# Patient Record
Sex: Male | Born: 1965 | Race: Black or African American | Hispanic: No | Marital: Single | Smoking: Never smoker
Health system: Southern US, Community
[De-identification: ages and names within clinical notes are randomized; demographics above are authoritative.]

## PROBLEM LIST (undated history)

## (undated) DIAGNOSIS — F1010 Alcohol abuse, uncomplicated: Secondary | ICD-10-CM

## (undated) DIAGNOSIS — B20 Human immunodeficiency virus [HIV] disease: Secondary | ICD-10-CM

## (undated) HISTORY — DX: Human immunodeficiency virus (HIV) disease: B20

## (undated) HISTORY — PX: NO PAST SURGERIES: SHX2092

---

## 1993-02-02 DIAGNOSIS — B20 Human immunodeficiency virus [HIV] disease: Secondary | ICD-10-CM

## 1993-02-02 HISTORY — DX: Human immunodeficiency virus (HIV) disease: B20

## 2001-09-30 ENCOUNTER — Emergency Department (HOSPITAL_COMMUNITY): Admission: EM | Admit: 2001-09-30 | Discharge: 2001-09-30 | Payer: Self-pay | Admitting: Emergency Medicine

## 2011-03-05 ENCOUNTER — Encounter (HOSPITAL_COMMUNITY): Payer: Self-pay | Admitting: Emergency Medicine

## 2011-03-05 ENCOUNTER — Emergency Department (INDEPENDENT_AMBULATORY_CARE_PROVIDER_SITE_OTHER)
Admission: EM | Admit: 2011-03-05 | Discharge: 2011-03-05 | Disposition: A | Discharge status: Home / Self Care | Payer: Self-pay | Source: Home / Self Care | Attending: Emergency Medicine | Admitting: Emergency Medicine

## 2011-03-05 ENCOUNTER — Emergency Department (INDEPENDENT_AMBULATORY_CARE_PROVIDER_SITE_OTHER): Payer: Self-pay

## 2011-03-05 DIAGNOSIS — S022XXA Fracture of nasal bones, initial encounter for closed fracture: Secondary | ICD-10-CM

## 2011-03-05 MED ORDER — IBUPROFEN 800 MG PO TABS: 800.0000 mg | ORAL_TABLET | Freq: Three times a day (TID) | ORAL | Status: AC

## 2011-03-05 MED ORDER — TETANUS-DIPHTH-ACELL PERTUSSIS 5-2.5-18.5 LF-MCG/0.5 IM SUSP
INTRAMUSCULAR | Status: AC
  Filled 2011-03-05: qty 0.5

## 2011-03-05 MED ORDER — BACITRACIN ZINC 500 UNIT/GM EX OINT: TOPICAL_OINTMENT | Freq: Two times a day (BID) | CUTANEOUS | Status: AC

## 2011-03-05 MED ORDER — TETANUS-DIPHTH-ACELL PERTUSSIS 5-2.5-18.5 LF-MCG/0.5 IM SUSP
0.5000 mL | Freq: Once | INTRAMUSCULAR | Status: AC
  Administered 2011-03-05: 0.5 mL via INTRAMUSCULAR

## 2011-03-05 NOTE — ED Notes (Signed)
Verified armband, changes made
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
## 2011-03-05 NOTE — ED Notes (Signed)
Unknown tetanus

## 2011-03-05 NOTE — ED Notes (Signed)
Not in treatment room 
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
## 2011-04-20 ENCOUNTER — Encounter (HOSPITAL_COMMUNITY): Payer: Self-pay | Admitting: *Deleted

## 2011-04-20 ENCOUNTER — Emergency Department (HOSPITAL_COMMUNITY)
Admission: EM | Admit: 2011-04-20 | Discharge: 2011-04-21 | Disposition: A | Discharge status: Home / Self Care | Payer: Self-pay | Attending: Emergency Medicine | Admitting: Emergency Medicine

## 2011-04-20 DIAGNOSIS — R6883 Chills (without fever): Secondary | ICD-10-CM | POA: Insufficient documentation

## 2011-04-20 DIAGNOSIS — R05 Cough: Secondary | ICD-10-CM | POA: Insufficient documentation

## 2011-04-20 DIAGNOSIS — R059 Cough, unspecified: Secondary | ICD-10-CM | POA: Insufficient documentation

## 2011-04-20 DIAGNOSIS — J189 Pneumonia, unspecified organism: Secondary | ICD-10-CM | POA: Insufficient documentation
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
## 2011-04-21 ENCOUNTER — Emergency Department (HOSPITAL_COMMUNITY): Payer: Self-pay

## 2011-04-21 MED ORDER — DEXTROSE 5 % IV SOLN
1.0000 g | Freq: Once | INTRAVENOUS | Status: AC
  Administered 2011-04-21: 1 g via INTRAVENOUS
  Filled 2011-04-21: qty 10

## 2011-04-21 MED ORDER — AZITHROMYCIN 250 MG PO TABS
500.0000 mg | ORAL_TABLET | Freq: Once | ORAL | Status: AC
  Administered 2011-04-21: 500 mg via ORAL
  Filled 2011-04-21: qty 2

## 2011-04-21 MED ORDER — AZITHROMYCIN 250 MG PO TABS: ORAL_TABLET | ORAL | Status: AC

## 2011-04-21 NOTE — ED Notes (Signed)
PT. RESTING

## 2011-04-21 NOTE — ED Provider Notes (Signed)
Medical screening examination/treatment/procedure(s) were performed by non-physician practitioner and as supervising physician I was immediately available for consultation/collaboration.   Dejour Vos D Idriss Quackenbush, MD 04/21/11 0742 
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
## 2011-04-21 NOTE — Discharge Instructions (Signed)
Pneumonia, Adult Pneumonia is an infection of the lungs. It may be caused by a germ (virus or bacteria). Some types of pneumonia can spread easily from person to person. This can happen when you cough or sneeze. HOME CARE  Only take medicine as told by your doctor.   Take your medicine (antibiotics) as told. Finish it even if you start to feel better.   Do not smoke.   You may use a vaporizer or humidifier in your room. This can help loosen thick spit (mucus).   Sleep so you are almost sitting up (semi-upright). This helps reduce coughing.   Rest.  A shot (vaccine) can help prevent pneumonia. Shots are often advised for:  People over 68 years old.   Patients on chemotherapy.   People with long-term (chronic) lung problems.   People with immune system problems.  GET HELP RIGHT AWAY IF:   You are getting worse.   You cannot control your cough, and you are losing sleep.   You cough up blood.   Your pain gets worse, even with medicine.   You have a fever.   Any of your problems are getting worse, not better.   You have shortness of breath or chest pain.  MAKE SURE YOU:   Understand these instructions.   Will watch your condition.   Will get help right away if you are not doing well or get worse.  Document Released: 07/08/2007 Document Revised: 01/08/2011 Document Reviewed: 04/11/2010 [facility] Patient Information 2012 [place], [place]. Please try to rest as much as she possibly can drink plenty of fluids take the antibiotic on a daily basis starting on March 20, you can give him today's dose in the emergency department

## 2011-04-21 NOTE — ED Notes (Signed)
Pt sleeping, meal offered, declined food, took meds w/o difficulty, IVF infusing, abx infusing.

## 2012-04-18 ENCOUNTER — Encounter (HOSPITAL_COMMUNITY): Payer: Self-pay | Admitting: *Deleted

## 2012-04-18 DIAGNOSIS — K4090 Unilateral inguinal hernia, without obstruction or gangrene, not specified as recurrent: Secondary | ICD-10-CM | POA: Insufficient documentation

## 2012-04-18 NOTE — ED Notes (Addendum)
Pt states that he has had a hernia (undiagnosed) pt states that he can hear bowel sounds through his hernia and that he has lost a large amount of weight as well. Pt states that the hernia has been there for approx 10 months pt also started drinking alcohol 12 months ago.

## 2012-04-19 ENCOUNTER — Emergency Department (HOSPITAL_COMMUNITY)
Admission: EM | Admit: 2012-04-19 | Discharge: 2012-04-19 | Disposition: A | Discharge status: Home / Self Care | Payer: Self-pay | Attending: Emergency Medicine | Admitting: Emergency Medicine

## 2012-04-19 DIAGNOSIS — K4091 Unilateral inguinal hernia, without obstruction or gangrene, recurrent: Secondary | ICD-10-CM

## 2012-04-19 NOTE — ED Provider Notes (Signed)
History     CSN: [id number]  Arrival date & time 04/18/12  2052   First MD Initiated Contact with Patient 04/19/12 0030      Chief Complaint  Patient presents with  . Hernia    (Consider location/radiation/quality/duration/timing/severity/associated sxs/prior treatment) HPI Comments: PAtient has had L inguinal hernia for the past year that has intermittently bothered him Darien Ramus it is painful.. Also reports weight loss and ETOH abuse over the past year  The history is provided by the patient.    History reviewed. No pertinent past medical history.  History reviewed. No pertinent past surgical history.  History reviewed. No pertinent family history.  History  Substance Use Topics  . Smoking status: Never Smoker   . Smokeless tobacco: Not on file  . Alcohol Use: Yes      Review of Systems  Constitutional: Negative for fever and chills.  Gastrointestinal: Positive for abdominal pain. Negative for nausea and vomiting.  Genitourinary: Negative for dysuria, frequency, scrotal swelling, penile pain and testicular pain.  Skin: Negative for rash and wound.  All other systems reviewed and are negative.    Allergies  Tomato  Home Medications  No current outpatient prescriptions on file.  BP 133/78  Pulse 74  Temp(Src) 98.7 F (37.1 C) (Oral)  Resp 16  SpO2 99%  Physical Exam  Constitutional: He appears well-developed and well-nourished.  HENT:  Head: Normocephalic and atraumatic.  Eyes: Pupils are equal, round, and reactive to light.  Neck: Normal range of motion.  Cardiovascular: Normal rate and regular rhythm.   Pulmonary/Chest: Effort normal and breath sounds normal.  Abdominal: Soft. Bowel sounds are normal. He exhibits no distension. There is tenderness. A hernia is present. Hernia confirmed positive in the left inguinal area.      ED Course  Procedures (including critical care time)  Labs Reviewed - No data to display No results found.   1.  Inguinal hernia recurrent unilateral       MDM   L inguinal hernia easily reduced         Arman Filter, NP 04/19/12 [phone]

## 2012-04-19 NOTE — ED Provider Notes (Signed)
Medical screening examination/treatment/procedure(s) were performed by non-physician practitioner and as supervising physician I was immediately available for consultation/collaboration.  Jasmine Awe, MD 04/19/12 [phone]

## 2012-04-27 ENCOUNTER — Emergency Department (HOSPITAL_COMMUNITY): Payer: Self-pay

## 2012-04-27 ENCOUNTER — Encounter (HOSPITAL_COMMUNITY): Payer: Self-pay | Admitting: Emergency Medicine

## 2012-04-27 ENCOUNTER — Emergency Department (HOSPITAL_COMMUNITY)
Admission: EM | Admit: 2012-04-27 | Discharge: 2012-04-27 | Disposition: A | Discharge status: Home / Self Care | Payer: Self-pay | Attending: Emergency Medicine | Admitting: Emergency Medicine

## 2012-04-27 DIAGNOSIS — K4090 Unilateral inguinal hernia, without obstruction or gangrene, not specified as recurrent: Secondary | ICD-10-CM | POA: Insufficient documentation

## 2012-04-27 LAB — COMPREHENSIVE METABOLIC PANEL
ALT: 49 U/L (ref 0–53)
AST: 94 U/L — ABNORMAL HIGH (ref 0–37)
Albumin: 3 g/dL — ABNORMAL LOW (ref 3.5–5.2)
Alkaline Phosphatase: 111 U/L (ref 39–117)
BUN: 9 mg/dL (ref 6–23)
CO2: 24 mEq/L (ref 19–32)
Calcium: 8.4 mg/dL (ref 8.4–10.5)
Chloride: 99 mEq/L (ref 96–112)
Creatinine, Ser: 0.79 mg/dL (ref 0.50–1.35)
GFR calc Af Amer: >90 mL/min (ref >90)
GFR calc non Af Amer: >90 mL/min (ref >90)
Glucose, Bld: 71 mg/dL (ref 70–99)
Potassium: 3.5 mEq/L (ref 3.5–5.1)
Sodium: 134 mEq/L — ABNORMAL LOW (ref 135–145)
Total Bilirubin: 0.7 mg/dL (ref 0.3–1.2)
Total Protein: 6.7 g/dL (ref 6.0–8.3)

## 2012-04-27 LAB — CBC
HCT: 33.5 % — ABNORMAL LOW (ref 39.0–52.0)
Hemoglobin: 11.6 g/dL — ABNORMAL LOW (ref 13.0–17.0)
MCH: 30.4 pg (ref 26.0–34.0)
MCHC: 34.6 g/dL (ref 30.0–36.0)
MCV: 87.7 fL (ref 78.0–100.0)
Platelets: 118 10*3/uL — ABNORMAL LOW (ref 150–400)
RBC: 3.82 MIL/uL — ABNORMAL LOW (ref 4.22–5.81)
RDW: 17.7 % — ABNORMAL HIGH (ref 11.5–15.5)
WBC: 2.7 10*3/uL — ABNORMAL LOW (ref 4.0–10.5)

## 2012-04-27 MED ORDER — IOHEXOL 300 MG/ML  SOLN
100.0000 mL | Freq: Once | INTRAMUSCULAR | Status: AC | PRN
  Administered 2012-04-27: 100 mL via INTRAVENOUS

## 2012-04-27 MED ORDER — TRAMADOL HCL 50 MG PO TABS: 50.0000 mg | ORAL_TABLET | Freq: Four times a day (QID) | ORAL | Status: DC | PRN

## 2012-04-27 MED ORDER — MORPHINE SULFATE 4 MG/ML IJ SOLN
4.0000 mg | Freq: Once | INTRAMUSCULAR | Status: AC
  Administered 2012-04-27: 4 mg via INTRAVENOUS
  Filled 2012-04-27: qty 1

## 2012-04-27 MED ORDER — ONDANSETRON HCL 4 MG/2ML IJ SOLN
4.0000 mg | Freq: Once | INTRAMUSCULAR | Status: AC
  Administered 2012-04-27: 4 mg via INTRAVENOUS
  Filled 2012-04-27: qty 2

## 2012-04-27 MED ORDER — SODIUM CHLORIDE 0.9 % IV BOLUS (SEPSIS)
1000.0000 mL | Freq: Once | INTRAVENOUS | Status: AC
  Administered 2012-04-27: 1000 mL via INTRAVENOUS

## 2012-04-27 MED ORDER — IOHEXOL 300 MG/ML  SOLN
20.0000 mL | INTRAMUSCULAR | Status: DC
  Administered 2012-04-27: 50 mL via ORAL

## 2012-04-27 NOTE — ED Provider Notes (Addendum)
History     CSN: [id number]  Arrival date & time 04/27/12  0008   First MD Initiated Contact with Patient 04/27/12 0015      Chief Complaint  Patient presents with  . Inguinal Hernia    (Consider location/radiation/quality/duration/timing/severity/associated sxs/prior treatment) Patient is a 47 y.o. male presenting with abdominal pain.  Abdominal Pain Pain location:  LLQ Pain quality: aching   Pain radiates to:  Does not radiate Pain severity:  No pain Duration:  16 weeks Timing:  Constant Chronicity:  Recurrent Context comment:  Pt states was lifting furniture 4 mnth and felt pop, now more pain Relieved by:  Nothing Worsened by:  Nothing tried Ineffective treatments:  None tried   History reviewed. No pertinent past medical history.  History reviewed. No pertinent past surgical history.  No family history on file.  History  Substance Use Topics  . Smoking status: Never Smoker   . Smokeless tobacco: Not on file  . Alcohol Use: Yes      Review of Systems  Gastrointestinal: Positive for abdominal pain.  All other systems reviewed and are negative.    Allergies  Tomato  Home Medications  No current outpatient prescriptions on file.  BP 119/71  Pulse 63  Temp(Src) 97.3 F (36.3 C) (Oral)  Resp 18  SpO2 100%  Physical Exam  Nursing note and vitals reviewed. Constitutional: He is oriented to person, place, and time. He appears well-developed and well-nourished.  HENT:  Head: Normocephalic and atraumatic.  Eyes: Conjunctivae are normal. Pupils are equal, round, and reactive to light.  Neck: Normal range of motion. Neck supple.  Cardiovascular: Normal rate, regular rhythm, normal heart sounds and intact distal pulses.   Pulmonary/Chest: Effort normal and breath sounds normal.  Abdominal: Soft. Bowel sounds are normal. There is tenderness in the left lower quadrant.    Left inguinal hernia  Neurological: He is alert and oriented to person, place,  and time.  Skin: Skin is warm and dry.  Psychiatric: He has a normal mood and affect. His behavior is normal. Judgment and thought content normal.    ED Course  Procedures (including critical care time)  Labs Reviewed  CBC  COMPREHENSIVE METABOLIC PANEL   No results found.   No diagnosis found.    MDM  + inguinal hernia.  Will ct,  reassess No sxs of incarceration on ct or physical exam.  Will dc to [facility] with oupt surgery, and ret for new/worsening sxs       Shrey Boike Lytle Michaels, MD 04/27/12 0044  Kento Gossman Lytle Michaels, MD 04/27/12 0230

## 2012-04-27 NOTE — ED Notes (Signed)
PT. REPORTS LEFT INGUINAL HERNIA PAIN FOR SEVERAL DAYS , DENIES INJURY . SLIGHT NAUSEA.

## 2012-06-21 ENCOUNTER — Emergency Department (HOSPITAL_COMMUNITY): Payer: Self-pay

## 2012-06-21 ENCOUNTER — Emergency Department (HOSPITAL_COMMUNITY)
Admission: EM | Admit: 2012-06-21 | Discharge: 2012-06-21 | Disposition: A | Discharge status: Home / Self Care | Payer: Self-pay | Attending: Emergency Medicine | Admitting: Emergency Medicine

## 2012-06-21 ENCOUNTER — Encounter (HOSPITAL_COMMUNITY): Payer: Self-pay | Admitting: *Deleted

## 2012-06-21 DIAGNOSIS — Y929 Unspecified place or not applicable: Secondary | ICD-10-CM | POA: Insufficient documentation

## 2012-06-21 DIAGNOSIS — X58XXXA Exposure to other specified factors, initial encounter: Secondary | ICD-10-CM | POA: Insufficient documentation

## 2012-06-21 DIAGNOSIS — S83412A Sprain of medial collateral ligament of left knee, initial encounter: Secondary | ICD-10-CM

## 2012-06-21 DIAGNOSIS — M7989 Other specified soft tissue disorders: Secondary | ICD-10-CM

## 2012-06-21 DIAGNOSIS — M79609 Pain in unspecified limb: Secondary | ICD-10-CM

## 2012-06-21 DIAGNOSIS — Y939 Activity, unspecified: Secondary | ICD-10-CM | POA: Insufficient documentation

## 2012-06-21 DIAGNOSIS — M79605 Pain in left leg: Secondary | ICD-10-CM

## 2012-06-21 DIAGNOSIS — M25569 Pain in unspecified knee: Secondary | ICD-10-CM | POA: Insufficient documentation

## 2012-06-21 DIAGNOSIS — S83419A Sprain of medial collateral ligament of unspecified knee, initial encounter: Secondary | ICD-10-CM | POA: Insufficient documentation

## 2012-06-21 LAB — CBC WITH DIFFERENTIAL/PLATELET
Basophils Absolute: 0 10*3/uL (ref 0.0–0.1)
Basophils Relative: 1 % (ref 0–1)
Eosinophils Absolute: 0 10*3/uL (ref 0.0–0.7)
Eosinophils Relative: 1 % (ref 0–5)
HCT: 39.1 % (ref 39.0–52.0)
Hemoglobin: 13.6 g/dL (ref 13.0–17.0)
Lymphocytes Relative: 42 % (ref 12–46)
Lymphs Abs: 1.1 10*3/uL (ref 0.7–4.0)
MCH: 32.3 pg (ref 26.0–34.0)
MCHC: 34.8 g/dL (ref 30.0–36.0)
MCV: 92.9 fL (ref 78.0–100.0)
Monocytes Absolute: 0.3 10*3/uL (ref 0.1–1.0)
Monocytes Relative: 11 % (ref 3–12)
Neutro Abs: 1.2 10*3/uL — ABNORMAL LOW (ref 1.7–7.7)
Neutrophils Relative %: 45 % (ref 43–77)
Platelets: 164 10*3/uL (ref 150–400)
RBC: 4.21 MIL/uL — ABNORMAL LOW (ref 4.22–5.81)
RDW: 12.7 % (ref 11.5–15.5)
WBC: 2.6 10*3/uL — ABNORMAL LOW (ref 4.0–10.5)

## 2012-06-21 LAB — D-DIMER, QUANTITATIVE: D-Dimer, Quant: 0.91 ug/mL-FEU — ABNORMAL HIGH (ref 0.00–0.48)

## 2012-06-21 MED ORDER — HYDROCODONE-ACETAMINOPHEN 5-325 MG PO TABS
2.0000 | ORAL_TABLET | Freq: Once | ORAL | Status: AC
  Administered 2012-06-21: 2 via ORAL
  Filled 2012-06-21: qty 2

## 2012-06-21 MED ORDER — HYDROCODONE-ACETAMINOPHEN 5-325 MG PO TABS: 1.0000 | ORAL_TABLET | Freq: Four times a day (QID) | ORAL | Status: DC | PRN

## 2012-06-21 NOTE — ED Notes (Signed)
Pt.was explain how to place the knee sleeve on .

## 2012-06-21 NOTE — ED Notes (Signed)
Patient is alert and orientedx4.  Patient was explained discharge instructions and they understood them with no questions.   

## 2012-06-21 NOTE — ED Notes (Signed)
Patient transported to X-ray 

## 2012-06-21 NOTE — Progress Notes (Signed)
*  Preliminary Results* Left lower extremity venous duplex completed. Left lower extremity is negative for deep vein thrombosis. There is evidence of two complex structures of the medial and lateral left popliteal fossa, suggestive of possible muscle tear vs. Baker's cyst.  Preliminary results discussed with Dr.Steinl.  06/21/2012 8:57 AM Ryan Orozco, RDMS, RDCS

## 2012-06-21 NOTE — ED Notes (Signed)
Here with 2 week history of knee swelling - no known trauma or injury. Reports no redness. Denies fever. No prior history of this problem. Denies hx of gout or arthritis. No treatment (meds, ice or heat) at home.

## 2012-06-21 NOTE — ED Provider Notes (Signed)
History     CSN: [id number]  Arrival date & time 06/21/12  0311   None     No chief complaint on file.  HPI Ryan Orozco is a 47 y.o. male presenting with left knee pain. He is uncertain of any acute trauma, he says he may have twisted it. Says his pain has been worse for 2 days, it's in the medial aspect of his leg, worse on ambulation and pivoting. It is a pinching sharp pain, moderate to severe, no radiation. He says he feels like he's got a lump at the back of his knee as well. Sometimes he has pain in his calf as well. No history of venous thromboembolic disease. No known medical history. Patient says he had a couple of shots of vodka tonight.   No past medical history on file.  No past surgical history on file.  No family history on file.  History  Substance Use Topics  . Smoking status: Never Smoker   . Smokeless tobacco: Not on file  . Alcohol Use: Yes      Review of Systems At least 10pt or greater review of systems completed and are negative except where specified in the HPI.  Allergies  Tomato  Home Medications   Current Outpatient Rx  Name  Route  Sig  Dispense  Refill  . traMADol (ULTRAM) 50 MG tablet   Oral   Take 1 tablet (50 mg total) by mouth every 6 (six) hours as needed for pain.   15 tablet   0     There were no vitals taken for this visit.  Physical Exam  Nursing notes reviewed.  Electronic medical record reviewed. VITAL SIGNS:   Filed Vitals:   06/21/12 0315  BP: 147/111  Pulse: 68  Temp: 97.9 F (36.6 C)  TempSrc: Oral  Resp: 17  SpO2: 99%   CONSTITUTIONAL: Awake, oriented x4, appears mildly intoxicated with alcohol, quiet slightly slurred speech HENT: Atraumatic, normocephalic, oral mucosa pink and moist, airway patent. Nares patent without drainage. External ears normal. EYES: Conjunctiva clear, EOMI, PERRLA NECK: Trachea midline, non-tender, supple CARDIOVASCULAR: Normal heart rate, Normal rhythm, No murmurs, rubs,  gallops PULMONARY/CHEST: Clear to auscultation, no rhonchi, wheezes, or rales. Symmetrical breath sounds. Non-tender. ABDOMINAL: Non-distended, soft, non-tender - no rebound or guarding.  BS normal. NEUROLOGIC: Non-focal, moving all four extremities, no gross sensory or motor deficits. EXTREMITIES: No clubbing, cyanosis. 2+ edema left lower extremity. Png-pong ball sized mass in the left popliteal fossa, tenderness over the popliteal fossa and in the left calf. SKIN: Warm, Dry, No erythema, No rash  ED Course  Procedures (including critical care time)  Labs Reviewed  CBC WITH DIFFERENTIAL - Abnormal; Notable for the following:    WBC 2.6 (*)    RBC 4.21 (*)    Neutro Abs 1.2 (*)    All other components within normal limits  D-DIMER, QUANTITATIVE - Abnormal; Notable for the following:    D-Dimer, Quant 0.91 (*)    All other components within normal limits   Dg Knee Complete 4 Views Left  06/21/2012   *RADIOLOGY REPORT*  Clinical Data: Injured left knee.  LEFT KNEE - COMPLETE 4+ VIEW  Comparison: None  Findings: The joint spaces are maintained.  No acute fracture or osteochondral abnormality.  No definite joint effusion.  IMPRESSION: No acute bony findings.   Original Report Authenticated By: Rudie Meyer, M.D.     No diagnosis found.    MDM  Ryan Orozco is a 47  y.o. male presents with no known trauma complaining about left knee pain on the medial aspect of the knee. My physical exam he seems to have a medial meniscus injury, patient also has 2+ pitting edema of the left leg and none on the right leg. No history of venous thromboembolic disease. He does have tenderness along the deep venous system of the calf.   I think the patient likely has a Baker's cyst, and that the patient has injured his knee by twisting it and due to  patient's alcohol intake he may not be a reliable historian when it comes to the exact mechanism of injury.  A Wells score for DVT we gave him 2 points for  localized tenderness along the deep venous system and 2 points for pitting edema however I think the likelihood of an injury on that leg and a Baker's cyst is more likely which gives him a Wells score of 0.  In addition, if I were to treat him with Lovenox tonight and have him followup with an ultrasound in the morning, and he had a hemarthrosis, this may exacerbate it and cause further damage. A d-dimer did return positive, and on my suspicion remains low for a DVT, we'll have the patient ultrasound in the morning.  We'll keep him in the emergency department for lower extremity ultrasound in the a.m.   Plan on discharging the patient for MCL sprain with knee support, analgesics and followup with orthopedics.  Dr. Denton Lank will handle final disposition after [place] of RLE.        Jones Skene, MD 06/21/12 [phone]

## 2017-04-15 ENCOUNTER — Other Ambulatory Visit: Payer: Self-pay

## 2017-04-15 ENCOUNTER — Emergency Department (HOSPITAL_COMMUNITY)
Admission: EM | Admit: 2017-04-15 | Discharge: 2017-04-15 | Disposition: A | Discharge status: Home / Self Care | Payer: Self-pay | Attending: Emergency Medicine | Admitting: Emergency Medicine

## 2017-04-15 ENCOUNTER — Encounter (HOSPITAL_COMMUNITY): Payer: Self-pay | Admitting: Emergency Medicine

## 2017-04-15 ENCOUNTER — Emergency Department (HOSPITAL_COMMUNITY): Payer: Self-pay

## 2017-04-15 DIAGNOSIS — Y929 Unspecified place or not applicable: Secondary | ICD-10-CM | POA: Insufficient documentation

## 2017-04-15 DIAGNOSIS — Y999 Unspecified external cause status: Secondary | ICD-10-CM | POA: Insufficient documentation

## 2017-04-15 DIAGNOSIS — S0231XA Fracture of orbital floor, right side, initial encounter for closed fracture: Secondary | ICD-10-CM | POA: Insufficient documentation

## 2017-04-15 DIAGNOSIS — S0285XA Fracture of orbit, unspecified, initial encounter for closed fracture: Secondary | ICD-10-CM

## 2017-04-15 DIAGNOSIS — Y939 Activity, unspecified: Secondary | ICD-10-CM | POA: Insufficient documentation

## 2017-04-15 MED ORDER — OXYCODONE-ACETAMINOPHEN 5-325 MG PO TABS
1.0000 | ORAL_TABLET | Freq: Once | ORAL | Status: AC
  Administered 2017-04-15: 1 via ORAL
  Filled 2017-04-15: qty 1

## 2017-04-15 MED ORDER — IBUPROFEN 800 MG PO TABS
800.0000 mg | ORAL_TABLET | Freq: Once | ORAL | Status: AC
  Administered 2017-04-15: 800 mg via ORAL
  Filled 2017-04-15: qty 1

## 2017-04-15 MED ORDER — OXYCODONE-ACETAMINOPHEN 5-325 MG PO TABS: 1.0000 | ORAL_TABLET | ORAL | 0 refills | Status: DC | PRN

## 2017-04-15 MED ORDER — FLUORESCEIN SODIUM 1 MG OP STRP
1.0000 | ORAL_STRIP | Freq: Once | OPHTHALMIC | Status: AC
  Administered 2017-04-15: 1 via OPHTHALMIC
  Filled 2017-04-15: qty 1

## 2017-04-15 MED ORDER — ERYTHROMYCIN 5 MG/GM OP OINT
1.0000 "application " | TOPICAL_OINTMENT | Freq: Once | OPHTHALMIC | Status: AC
  Administered 2017-04-15: 1 via OPHTHALMIC
  Filled 2017-04-15: qty 3.5

## 2017-04-15 MED ORDER — TETRACAINE HCL 0.5 % OP SOLN
1.0000 [drp] | Freq: Once | OPHTHALMIC | Status: AC
  Administered 2017-04-15: 1 [drp] via OPHTHALMIC
  Filled 2017-04-15: qty 4

## 2017-04-15 MED ORDER — ERYTHROMYCIN 5 MG/GM OP OINT: TOPICAL_OINTMENT | OPHTHALMIC | 0 refills | Status: DC
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
## 2017-04-15 NOTE — ED Notes (Signed)
Pt to CT

## 2017-04-15 NOTE — ED Notes (Signed)
D/c reviewed with teach back. Encouraged to follow up as ordered. No further questions at this time
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
## 2017-04-15 NOTE — ED Notes (Signed)
ED Provider at bedside. 

## 2017-04-15 NOTE — Discharge Instructions (Signed)
Please read attached information. If you experience any new or worsening signs or symptoms please return to the emergency room for evaluation. Please follow-up with your primary care provider or specialist as discussed. DO NOT BLOW YOUR NOSE. Please use medication prescribed only as directed and discontinue taking if you have any concerning signs or symptoms.

## 2018-01-24 ENCOUNTER — Ambulatory Visit: Payer: Self-pay

## 2018-01-24 ENCOUNTER — Other Ambulatory Visit: Payer: Self-pay

## 2018-01-24 ENCOUNTER — Encounter: Payer: Self-pay | Admitting: Infectious Diseases

## 2018-01-24 DIAGNOSIS — B20 Human immunodeficiency virus [HIV] disease: Secondary | ICD-10-CM

## 2018-01-24 NOTE — Addendum Note (Signed)
Addended by: Mariea ClontsGREEN, Junia Nygren D on: 01/24/2018 11:04 AM   Modules accepted: Orders

## 2018-01-25 LAB — URINALYSIS
Bilirubin Urine: NEGATIVE
Glucose, UA: NEGATIVE
Hgb urine dipstick: NEGATIVE
Ketones, ur: NEGATIVE
Leukocytes, UA: NEGATIVE
Nitrite: NEGATIVE
Protein, ur: NEGATIVE
Specific Gravity, Urine: 1.006 (ref 1.001–1.03)
pH: 7 (ref 5.0–8.0)

## 2018-01-25 LAB — URINE CYTOLOGY ANCILLARY ONLY
Chlamydia: NEGATIVE
Neisseria Gonorrhea: NEGATIVE

## 2018-01-25 LAB — T-HELPER CELL (CD4) - (RCID CLINIC ONLY)
CD4 % Helper T Cell: 37 % (ref 33–55)
CD4 T Cell Abs: 250 /uL — ABNORMAL LOW (ref 400–2700)

## 2018-02-03 LAB — HIV-1 RNA ULTRAQUANT REFLEX TO GENTYP+
HIV 1 RNA Quant: 6650 copies/mL — ABNORMAL HIGH
HIV-1 RNA Quant, Log: 3.82 Log copies/mL — ABNORMAL HIGH

## 2018-02-03 LAB — CBC WITH DIFFERENTIAL/PLATELET
Absolute Monocytes: 418 cells/uL (ref 200–950)
Basophils Absolute: 22 cells/uL (ref 0–200)
Basophils Relative: 0.9 %
Eosinophils Absolute: 103 cells/uL (ref 15–500)
Eosinophils Relative: 4.3 %
HCT: 41.9 % (ref 38.5–50.0)
Hemoglobin: 14.1 g/dL (ref 13.2–17.1)
Lymphs Abs: 684 cells/uL — ABNORMAL LOW (ref 850–3900)
MCH: 28.7 pg (ref 27.0–33.0)
MCHC: 33.7 g/dL (ref 32.0–36.0)
MCV: 85.3 fL (ref 80.0–100.0)
MPV: 11.7 fL (ref 7.5–12.5)
Monocytes Relative: 17.4 %
Neutro Abs: 1174 cells/uL — ABNORMAL LOW (ref 1500–7800)
Neutrophils Relative %: 48.9 %
Platelets: 135 10*3/uL — ABNORMAL LOW (ref 140–400)
RBC: 4.91 10*6/uL (ref 4.20–5.80)
RDW: 12 % (ref 11.0–15.0)
Total Lymphocyte: 28.5 %
WBC: 2.4 10*3/uL — ABNORMAL LOW (ref 3.8–10.8)

## 2018-02-03 LAB — HEPATITIS B SURFACE ANTIGEN: Hepatitis B Surface Ag: NONREACTIVE

## 2018-02-03 LAB — COMPLETE METABOLIC PANEL WITH GFR
AG Ratio: 1 (calc) (ref 1.0–2.5)
ALT: 16 U/L (ref 9–46)
AST: 28 U/L (ref 10–35)
Albumin: 3.9 g/dL (ref 3.6–5.1)
Alkaline phosphatase (APISO): 141 U/L — ABNORMAL HIGH (ref 40–115)
BUN: 8 mg/dL (ref 7–25)
CO2: 28 mmol/L (ref 20–32)
Calcium: 9.2 mg/dL (ref 8.6–10.3)
Chloride: 102 mmol/L (ref 98–110)
Creat: 0.72 mg/dL (ref 0.70–1.33)
GFR, Est African American: 123 mL/min/{1.73_m2} (ref >=60)
GFR, Est Non African American: 107 mL/min/{1.73_m2} (ref >=60)
Globulin: 3.8 g/dL (calc) — ABNORMAL HIGH (ref 1.9–3.7)
Glucose, Bld: 81 mg/dL (ref 65–99)
Potassium: 4 mmol/L (ref 3.5–5.3)
Sodium: 141 mmol/L (ref 135–146)
Total Bilirubin: 0.5 mg/dL (ref 0.2–1.2)
Total Protein: 7.7 g/dL (ref 6.1–8.1)

## 2018-02-03 LAB — LIPID PANEL
Cholesterol: 137 mg/dL (ref <200)
HDL: 52 mg/dL (ref >40)
LDL Cholesterol (Calc): 68 mg/dL (calc)
Non-HDL Cholesterol (Calc): 85 mg/dL (calc) (ref <130)
Total CHOL/HDL Ratio: 2.6 (calc) (ref <5.0)
Triglycerides: 89 mg/dL (ref <150)

## 2018-02-03 LAB — RPR: RPR Ser Ql: NONREACTIVE

## 2018-02-03 LAB — QUANTIFERON-TB GOLD PLUS
Mitogen-NIL: 4.77 IU/mL
NIL: 0.35 IU/mL
QuantiFERON-TB Gold Plus: NEGATIVE
TB1-NIL: 0.01 IU/mL
TB2-NIL: 0.02 IU/mL

## 2018-02-03 LAB — HEPATITIS B SURFACE ANTIBODY,QUALITATIVE: Hep B S Ab: REACTIVE — AB

## 2018-02-03 LAB — HIV-1 GENOTYPE: HIV-1 Genotype: DETECTED — AB

## 2018-02-03 LAB — HEPATITIS B CORE ANTIBODY, TOTAL: Hep B Core Total Ab: REACTIVE — AB

## 2018-02-03 LAB — HIV-1/2 AB - DIFFERENTIATION
HIV-1 antibody: POSITIVE — AB
HIV-2 Ab: NEGATIVE

## 2018-02-03 LAB — HEPATITIS C ANTIBODY
Hepatitis C Ab: NONREACTIVE
SIGNAL TO CUT-OFF: 0.13 (ref <1.00)

## 2018-02-03 LAB — HEPATITIS A ANTIBODY, TOTAL: Hepatitis A AB,Total: REACTIVE — AB

## 2018-02-03 LAB — HIV ANTIBODY (ROUTINE TESTING W REFLEX): HIV 1&2 Ab, 4th Generation: REACTIVE — AB

## 2018-02-03 LAB — HLA B*5701: HLA-B*5701 w/rflx HLA-B High: NEGATIVE

## 2018-02-11 ENCOUNTER — Ambulatory Visit

## 2018-02-11 ENCOUNTER — Ambulatory Visit: Admitting: Infectious Diseases

## 2018-02-18 ENCOUNTER — Ambulatory Visit (INDEPENDENT_AMBULATORY_CARE_PROVIDER_SITE_OTHER): Admitting: Pharmacist

## 2018-02-18 ENCOUNTER — Encounter: Payer: Self-pay | Admitting: Infectious Diseases

## 2018-02-18 ENCOUNTER — Ambulatory Visit (INDEPENDENT_AMBULATORY_CARE_PROVIDER_SITE_OTHER): Admitting: Infectious Diseases

## 2018-02-18 VITALS — BP 162/96 | HR 64 | Temp 97.8°F

## 2018-02-18 DIAGNOSIS — Z0000 Encounter for general adult medical examination without abnormal findings: Secondary | ICD-10-CM | POA: Insufficient documentation

## 2018-02-18 DIAGNOSIS — Z21 Asymptomatic human immunodeficiency virus [HIV] infection status: Secondary | ICD-10-CM

## 2018-02-18 DIAGNOSIS — I10 Essential (primary) hypertension: Secondary | ICD-10-CM

## 2018-02-18 DIAGNOSIS — B20 Human immunodeficiency virus [HIV] disease: Secondary | ICD-10-CM | POA: Insufficient documentation

## 2018-02-18 MED ORDER — BICTEGRAVIR-EMTRICITAB-TENOFOV 50-200-25 MG PO TABS: 1.0000 | ORAL_TABLET | Freq: Every day | ORAL | 5 refills | Status: DC

## 2018-02-18 NOTE — Progress Notes (Signed)
HPI: Ryan NasutiReggie Orozco is a 53 y.o. male presenting to [facility] for an initial visit with Ryan Orozco.  Patient Active Problem List   Diagnosis Date Noted  . HIV (human immunodeficiency virus infection) (HCC) 02/18/2018    Patient's Medications  New Prescriptions   No medications on file  Previous Medications   BICTEGRAVIR-EMTRICITABINE-TENOFOVIR AF (BIKTARVY) 50-200-25 MG TABS TABLET    Take 1 tablet by mouth daily.   ERYTHROMYCIN OPHTHALMIC OINTMENT    Place a 1/2 inch ribbon of ointment into the lower eyelid.   HYDROCODONE-ACETAMINOPHEN (NORCO/VICODIN) 5-325 MG PER TABLET    Take 1-2 tablets by mouth every 6 (six) hours as needed for pain.   OXYCODONE-ACETAMINOPHEN (PERCOCET/ROXICET) 5-325 MG TABLET    Take 1 tablet by mouth every 4 (four) hours as needed for severe pain.  Modified Medications   No medications on file  Discontinued Medications   No medications on file    Allergies: Allergies  Allergen Reactions  . Tomato Hives    Past Medical History: Past Medical History:  Diagnosis Date  . HIV (human immunodeficiency virus infection) (HCC) 1995    Social History: Social History   Socioeconomic History  . Marital status: Single    Spouse name: Not on file  . Number of children: Not on file  . Years of education: Not on file  . Highest education level: Not on file  Occupational History  . Not on file  Social Needs  . Financial resource strain: Not on file  . Food insecurity:    Worry: Not on file    Inability: Not on file  . Transportation needs:    Medical: Not on file    Non-medical: Not on file  Tobacco Use  . Smoking status: Never Smoker  . Smokeless tobacco: Never Used  Substance and Sexual Activity  . Alcohol use: Yes    Comment: occasionally  . Drug use: No  . Sexual activity: Yes    Partners: Female    Birth control/protection: Condom  Lifestyle  . Physical activity:    Days per week: Not on file    Minutes per session: Not on file  .  Stress: Not on file  Relationships  . Social connections:    Talks on phone: Not on file    Gets together: Not on file    Attends religious service: Not on file    Active member of club or organization: Not on file    Attends meetings of clubs or organizations: Not on file    Relationship status: Not on file  Other Topics Concern  . Not on file  Social History Narrative  . Not on file    Labs: Lab Results  Component Value Date   HIV1RNAQUANT 6,650 (H) 01/24/2018   CD4TABS 250 (L) 01/24/2018    RPR and STI Lab Results  Component Value Date   LABRPR NON-REACTIVE 01/24/2018    STI Results GC CT  01/24/2018 Negative Negative    Hepatitis B Lab Results  Component Value Date   HEPBSAB REACTIVE (A) 01/24/2018   HEPBSAG NON-REACTIVE 01/24/2018   HEPBCAB REACTIVE (A) 01/24/2018   Hepatitis C Lab Results  Component Value Date   HEPCAB NON-REACTIVE 01/24/2018   Hepatitis A Lab Results  Component Value Date   HAV REACTIVE (A) 01/24/2018   Lipids: Lab Results  Component Value Date   CHOL 137 01/24/2018   TRIG 89 01/24/2018   HDL 52 01/24/2018   CHOLHDL 2.6 01/24/2018   LDLCALC 68  01/24/2018    Current HIV Regimen: Treatment-naive  Assessment: Ryan Orozco is here today for an initial HIV appointment with Ryan Orozco. He was first diagnosed in 1995 and has never been on medication. Discussed initiating Biktarvy one pill once daily with or without food. He states that he does not currently take any other medications. Discussed the importance of adherence and to take the medication at the same time every day. Suggested taking Biktarvy either first thing in the morning, paired with a meal, or right before bed to help him remember to take it. Discussed possible side effects of headaches, nausea, or diarrhea. Advised that he can take OTC ibuprofen/Tylenol to help with headaches and to take with food to alleviate nausea. Ryan Orozco expressed no other questions or concerns about  Biktarvy at this time.  Ryan Orozco is expecting to be released from jail on Tuesday. His ADAP is pending, Ryan Orozco is calling to see if it has gone through yet. Will coordinate for his to get his medications when he is released.  Plan: -Biktarvy 1 tab PO daily -Will coordinate getting his meds pending the status of his ADAP when he is released on Tuesday   Ryan Orozco [facility] 02/18/2018, 10:35 AM

## 2018-02-18 NOTE — Assessment & Plan Note (Signed)
Flu and pneumonia vaccine today.

## 2018-02-18 NOTE — Patient Instructions (Addendum)
Nice to meet you today!  We need to get you started on medications - I would like to start you on Biktarvy once a day  Biktarvy is the pill for your HIV infection - this will need to be taken once a day around the same time.  - Common side effects for a short time frame usually include headaches, nausea and diarrhea - OK to take over the counter tylenol for headaches and imodium for diarrhea - Try taking with food if you are nauseated  - Please do not take with multivitamins - separate by 6 hours    We can arrange for medications to be sent

## 2018-02-18 NOTE — Assessment & Plan Note (Signed)
New patient here to establish for HIV care. I discussed with Grace Wiest treatment options/side effects, benefits of treatment and long-term outcomes. I discussed how HIV is transmitted and the process of untreated HIV including increased risk for opportunistic infections, cancer, dementia and renal failure. Patient was counseled on routine HIV care including medication adherence, blood monitoring, necessary vaccines and follow up visits. Counseled regarding safe sex practices including: condom use, partner disclosure, limiting partners. Patient spent time talking with our pharmacist Cassie regarding successful practices of ART and understands to reach out to our clinic in the future with questions.   Will start BIKTARVY for HIV treatment. Discussed interval for re-application today and services provided on [place] [facility]. He has been approved through March but needs to see Olegario Messier today to re-enroll for coverage through September.   General introduction to our clinic and integrated services. Introduced to [place] and [facility] today. Dental referral placed today for [facility]. Information to schedule appointment completed today.   I spent greater than 45 minutes with the patient today. Greater than 50% of the time spent face-to-face counseling and coordination of care re: HIV and health maintenance.

## 2018-02-18 NOTE — Progress Notes (Signed)
Name: Ryan Orozco [place]  DOB: 03-Sep-1965 MRN: [id number] PCP: Default, Provider, MD    Patient Active Problem List   Diagnosis Date Noted  . HIV (human immunodeficiency virus infection) (HCC) 02/18/2018  . Healthcare maintenance 02/18/2018  . Hypertension 02/18/2018     Brief Narrative:  Ryan Orozco Bonnie is a 53 y.o. male with HIV infection, Dx 1995. He has never taken medications for this. History of OIs: none reported. HIV Risk: .   Previous Regimens: . Naive   Genotypes: . 01/24/18 - wild type virus   Subjective:  CC:  New patient for HIV care. No complaints.   HPI; Here today with guard from prison - permission given by patient today to speak openly about all aspects of health care pertaining to today's visit.   He is feeling well. He tells me that he was first diagnosed in 1995 but he does not recall much about the discussion. He states that he has never been on medication for this condition. Reports no complaints today suggestive of associated opportunistic infection or advancing HIV disease such as fevers, night sweats, weight loss, anorexia, cough, SOB, nausea, vomiting, diarrhea, headache, sensory changes, lymphadenopathy or oral thrush. His blood pressure is high today but feels that this is related to "eating something spicy" last night. He does not smoke cigarettes, does not use any illicit drugs. He does admit that he uses alcohol excessively when he is not incarcerated. He does not have insurance and does not have a PCP.    Review of Systems  Constitutional: Negative for chills, fever, malaise/fatigue and weight loss.  HENT: Negative for sore throat.        No dental problems  Respiratory: Negative for cough and sputum production.   Cardiovascular: Negative for chest pain and leg swelling.  Gastrointestinal: Negative for abdominal pain, diarrhea and vomiting.  Genitourinary: Negative for dysuria and flank pain.  Musculoskeletal: Negative for joint pain,  myalgias and neck pain.  Skin: Negative for rash.  Neurological: Negative for dizziness, tingling and headaches.  Psychiatric/Behavioral: Negative for depression and substance abuse. The patient is not nervous/anxious and does not have insomnia.     Past Medical History:  Diagnosis Date  . HIV (human immunodeficiency virus infection) (HCC) 1995    Outpatient Medications Prior to Visit  Medication Sig Dispense Refill  . erythromycin ophthalmic ointment Place a 1/2 inch ribbon of ointment into the lower eyelid. (Patient not taking: Reported on 02/18/2018) 1 g 0  . HYDROcodone-acetaminophen (NORCO/VICODIN) 5-325 MG per tablet Take 1-2 tablets by mouth every 6 (six) hours as needed for pain. (Patient not taking: Reported on 02/18/2018) 17 tablet 0  . oxyCODONE-acetaminophen (PERCOCET/ROXICET) 5-325 MG tablet Take 1 tablet by mouth every 4 (four) hours as needed for severe pain. (Patient not taking: Reported on 02/18/2018) 10 tablet 0   No facility-administered medications prior to visit.      Allergies  Allergen Reactions  . Tomato Hives    Social History   Tobacco Use  . Smoking status: Never Smoker  . Smokeless tobacco: Never Used  Substance Use Topics  . Alcohol use: Yes    Comment: occasionally  . Drug use: No    Family History  Problem Relation Age of Onset  . Hypertension Mother   . Throat cancer Mother   . Lung cancer Father   . Cancer Maternal Grandfather     Social History   Substance and Sexual Activity  Sexual Activity Yes  . Partners: Female  . Birth control/protection:  Condom     Objective:   Vitals:   02/18/18 0901 02/18/18 0935  BP: (!) 182/100 (!) 162/96  Pulse: (!) 59 64  Temp: 97.8 F (36.6 C)   TempSrc: Oral    There is no height or weight on file to calculate BMI.  Physical Exam Vitals signs reviewed.  HENT:     Mouth/Throat:     Mouth: Mucous membranes are moist. No oral lesions.     Dentition: Abnormal dentition (broken teeth).  Dental caries present.     Pharynx: Oropharynx is clear.  Eyes:     General: No scleral icterus. Cardiovascular:     Rate and Rhythm: Normal rate and regular rhythm.     Heart sounds: Normal heart sounds.  Pulmonary:     Effort: Pulmonary effort is normal.     Breath sounds: Normal breath sounds.  Abdominal:     General: There is no distension.     Palpations: Abdomen is soft.     Tenderness: There is no abdominal tenderness.  Lymphadenopathy:     Cervical: No cervical adenopathy.  Skin:    General: Skin is warm and dry.     Capillary Refill: Capillary refill takes less than 2 seconds.     Findings: No rash.  Neurological:     Mental Status: He is alert and oriented to person, place, and time.    Lab Results Lab Results  Component Value Date   WBC 2.4 (L) 01/24/2018   HGB 14.1 01/24/2018   HCT 41.9 01/24/2018   MCV 85.3 01/24/2018   PLT 135 (L) 01/24/2018    Lab Results  Component Value Date   CREATININE 0.72 01/24/2018   BUN 8 01/24/2018   NA 141 01/24/2018   K 4.0 01/24/2018   CL 102 01/24/2018   CO2 28 01/24/2018    Lab Results  Component Value Date   ALT 16 01/24/2018   AST 28 01/24/2018   ALKPHOS 111 04/27/2012   BILITOT 0.5 01/24/2018    Lab Results  Component Value Date   CHOL 137 01/24/2018   HDL 52 01/24/2018   LDLCALC 68 01/24/2018   TRIG 89 01/24/2018   CHOLHDL 2.6 01/24/2018   HIV 1 RNA Quant (copies/mL)  Date Value  01/24/2018 6,650 (H)   CD4 T Cell Abs (/uL)  Date Value  01/24/2018 250 (L)     Assessment & Plan:   Problem List Items Addressed This Visit      Unprioritized   HIV (human immunodeficiency virus infection) (HCC)    New patient here to establish for HIV care. I discussed with Toran Bear treatment options/side effects, benefits of treatment and long-term outcomes. I discussed how HIV is transmitted and the process of untreated HIV including increased risk for opportunistic infections, cancer, dementia and renal  failure. Patient was counseled on routine HIV care including medication adherence, blood monitoring, necessary vaccines and follow up visits. Counseled regarding safe sex practices including: condom use, partner disclosure, limiting partners. Patient spent time talking with our pharmacist Cassie regarding successful practices of ART and understands to reach out to our clinic in the future with questions.   Will start BIKTARVY for HIV treatment. Discussed interval for re-application today and services provided on [place] [facility]. He has been approved through March but needs to see Olegario MessierKathy today to re-enroll for coverage through September.   General introduction to our clinic and integrated services. Introduced to [place] and [facility] today. [facility]. Information  to schedule appointment completed today.   I spent greater than 45 minutes with the patient today. Greater than 50% of the time spent face-to-face counseling and coordination of care re: HIV and health maintenance.        Relevant Medications   bictegravir-emtricitabine-tenofovir AF (BIKTARVY) 50-200-25 MG TABS tablet   Healthcare maintenance    Flu and pneumonia vaccine today.       Hypertension    I asked the jail to check a few more blood pressures for him while he is there - released Tuesday. Will follow up need for medications. Likely he will need therapy.         Rexene Alberts, MSN, NP-C [facility]ious [facility] Pager: [phone] Office: [phone]  02/18/18  1:35 PM

## 2018-02-18 NOTE — Assessment & Plan Note (Signed)
I asked the jail to check a few more blood pressures for him while he is there - released Tuesday. Will follow up need for medications. Likely he will need therapy.

## 2018-05-19 ENCOUNTER — Telehealth: Payer: Self-pay

## 2018-05-19 NOTE — Telephone Encounter (Signed)
Called [facility] in attempt to schedule follow up appointment to maintain in care. Last seen in 02/2018.  Was able to leave a message with Rocco's wife for him to call the office back.   Gerarda Fraction, [place]

## 2019-01-11 ENCOUNTER — Encounter: Payer: Self-pay | Admitting: Infectious Diseases

## 2019-01-11 NOTE — Progress Notes (Signed)
Patient ID: Ryan Orozco, male   DOB: Jun 14, 1965, 53 y.o.   MRN: [id number] Called patient from Viral Load list  Left message to call for appointment

## 2019-02-09 ENCOUNTER — Encounter: Payer: Self-pay | Admitting: Infectious Diseases

## 2019-02-09 NOTE — Progress Notes (Signed)
Patient ID: Ryan Orozco, male   DOB: 22-Dec-1965, 54 y.o.   MRN: [id number] Working Viral Load List called patient Ryan Orozco left message to call to schedule his next appointment

## 2019-03-17 ENCOUNTER — Telehealth: Payer: Self-pay

## 2019-03-17 NOTE — Telephone Encounter (Signed)
Received refill request from pharmacy for pt's biktarvy. Has not been seen since 02/2018. Will need appt for future refills.  Unable to speak with pt nor leave vm. Lorenso Courier

## 2019-06-07 ENCOUNTER — Other Ambulatory Visit: Payer: Self-pay | Admitting: Infectious Diseases

## 2019-06-30 ENCOUNTER — Other Ambulatory Visit: Payer: Self-pay | Admitting: Infectious Diseases

## 2019-10-06 ENCOUNTER — Other Ambulatory Visit: Payer: Self-pay

## 2019-10-06 DIAGNOSIS — Z113 Encounter for screening for infections with a predominantly sexual mode of transmission: Secondary | ICD-10-CM

## 2019-10-06 DIAGNOSIS — B20 Human immunodeficiency virus [HIV] disease: Secondary | ICD-10-CM

## 2019-10-06 DIAGNOSIS — Z79899 Other long term (current) drug therapy: Secondary | ICD-10-CM

## 2019-10-12 ENCOUNTER — Other Ambulatory Visit: Payer: Self-pay

## 2019-10-12 ENCOUNTER — Ambulatory Visit: Payer: Self-pay

## 2019-10-12 DIAGNOSIS — B20 Human immunodeficiency virus [HIV] disease: Secondary | ICD-10-CM

## 2019-10-12 DIAGNOSIS — Z113 Encounter for screening for infections with a predominantly sexual mode of transmission: Secondary | ICD-10-CM

## 2019-10-12 DIAGNOSIS — Z79899 Other long term (current) drug therapy: Secondary | ICD-10-CM

## 2019-10-13 LAB — T-HELPER CELL (CD4) - (RCID CLINIC ONLY)
CD4 % Helper T Cell: 33 % (ref 33–65)
CD4 T Cell Abs: 191 /uL — ABNORMAL LOW (ref 400–1790)

## 2019-10-15 LAB — CBC WITH DIFFERENTIAL/PLATELET
Absolute Monocytes: 422 cells/uL (ref 200–950)
Basophils Absolute: 41 cells/uL (ref 0–200)
Basophils Relative: 1.7 %
Eosinophils Absolute: 60 cells/uL (ref 15–500)
Eosinophils Relative: 2.5 %
HCT: 40.6 % (ref 38.5–50.0)
Hemoglobin: 13.7 g/dL (ref 13.2–17.1)
Lymphs Abs: 583 cells/uL — ABNORMAL LOW (ref 850–3900)
MCH: 30.4 pg (ref 27.0–33.0)
MCHC: 33.7 g/dL (ref 32.0–36.0)
MCV: 90 fL (ref 80.0–100.0)
MPV: 9.8 fL (ref 7.5–12.5)
Monocytes Relative: 17.6 %
Neutro Abs: 1294 cells/uL — ABNORMAL LOW (ref 1500–7800)
Neutrophils Relative %: 53.9 %
Platelets: 141 10*3/uL (ref 140–400)
RBC: 4.51 10*6/uL (ref 4.20–5.80)
RDW: 14.9 % (ref 11.0–15.0)
Total Lymphocyte: 24.3 %
WBC: 2.4 10*3/uL — ABNORMAL LOW (ref 3.8–10.8)

## 2019-10-15 LAB — COMPLETE METABOLIC PANEL WITH GFR
AG Ratio: 1.1 (calc) (ref 1.0–2.5)
ALT: 75 U/L — ABNORMAL HIGH (ref 9–46)
AST: 232 U/L — ABNORMAL HIGH (ref 10–35)
Albumin: 4.1 g/dL (ref 3.6–5.1)
Alkaline phosphatase (APISO): 98 U/L (ref 35–144)
BUN: 8 mg/dL (ref 7–25)
CO2: 25 mmol/L (ref 20–32)
Calcium: 8.9 mg/dL (ref 8.6–10.3)
Chloride: 100 mmol/L (ref 98–110)
Creat: 0.72 mg/dL (ref 0.70–1.33)
GFR, Est African American: 123 mL/min/{1.73_m2} (ref >=60)
GFR, Est Non African American: 106 mL/min/{1.73_m2} (ref >=60)
Globulin: 3.8 g/dL (calc) — ABNORMAL HIGH (ref 1.9–3.7)
Glucose, Bld: 66 mg/dL (ref 65–99)
Potassium: 3.8 mmol/L (ref 3.5–5.3)
Sodium: 136 mmol/L (ref 135–146)
Total Bilirubin: 0.7 mg/dL (ref 0.2–1.2)
Total Protein: 7.9 g/dL (ref 6.1–8.1)

## 2019-10-15 LAB — LIPID PANEL
Cholesterol: 197 mg/dL (ref <200)
HDL: 96 mg/dL (ref >=40)
LDL Cholesterol (Calc): 86 mg/dL (calc)
Non-HDL Cholesterol (Calc): 101 mg/dL (calc) (ref <130)
Total CHOL/HDL Ratio: 2.1 (calc) (ref <5.0)
Triglycerides: 67 mg/dL (ref <150)

## 2019-10-15 LAB — HIV-1 RNA QUANT-NO REFLEX-BLD
HIV 1 RNA Quant: 1020 Copies/mL — ABNORMAL HIGH
HIV-1 RNA Quant, Log: 3.01 Log cps/mL — ABNORMAL HIGH

## 2019-10-15 LAB — RPR: RPR Ser Ql: NONREACTIVE

## 2019-10-19 ENCOUNTER — Other Ambulatory Visit: Payer: Self-pay

## 2019-10-19 ENCOUNTER — Emergency Department (HOSPITAL_COMMUNITY)
Admission: EM | Admit: 2019-10-19 | Discharge: 2019-10-20 | Disposition: A | Discharge status: Home / Self Care | Payer: Medicaid Other | Attending: Emergency Medicine | Admitting: Emergency Medicine

## 2019-10-19 DIAGNOSIS — Z5321 Procedure and treatment not carried out due to patient leaving prior to being seen by health care provider: Secondary | ICD-10-CM | POA: Insufficient documentation

## 2019-10-19 DIAGNOSIS — F10929 Alcohol use, unspecified with intoxication, unspecified: Secondary | ICD-10-CM | POA: Diagnosis present

## 2019-10-19 LAB — CBG MONITORING, ED: Glucose-Capillary: 122 mg/dL — ABNORMAL HIGH (ref 70–99)
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
## 2019-10-20 NOTE — ED Notes (Signed)
Pt called x 3 no answer 

## 2019-10-20 NOTE — ED Notes (Signed)
Called for vitals no answer °

## 2019-10-27 ENCOUNTER — Encounter: Admitting: Infectious Diseases

## 2019-10-29 ENCOUNTER — Encounter (HOSPITAL_COMMUNITY): Payer: Self-pay

## 2019-10-29 ENCOUNTER — Emergency Department (HOSPITAL_COMMUNITY)
Admission: EM | Admit: 2019-10-29 | Discharge: 2019-10-30 | Disposition: A | Discharge status: Home / Self Care | Payer: Medicaid Other | Attending: Emergency Medicine | Admitting: Emergency Medicine

## 2019-10-29 DIAGNOSIS — F10129 Alcohol abuse with intoxication, unspecified: Secondary | ICD-10-CM | POA: Diagnosis present

## 2019-10-29 DIAGNOSIS — F1010 Alcohol abuse, uncomplicated: Secondary | ICD-10-CM | POA: Diagnosis not present

## 2019-10-29 DIAGNOSIS — Z79899 Other long term (current) drug therapy: Secondary | ICD-10-CM | POA: Insufficient documentation

## 2019-10-29 DIAGNOSIS — E876 Hypokalemia: Secondary | ICD-10-CM | POA: Insufficient documentation

## 2019-10-29 DIAGNOSIS — I10 Essential (primary) hypertension: Secondary | ICD-10-CM | POA: Insufficient documentation

## 2019-10-29 DIAGNOSIS — R030 Elevated blood-pressure reading, without diagnosis of hypertension: Secondary | ICD-10-CM | POA: Insufficient documentation

## 2019-10-29 LAB — COMPREHENSIVE METABOLIC PANEL
ALT: 66 U/L — ABNORMAL HIGH (ref 0–44)
AST: 81 U/L — ABNORMAL HIGH (ref 15–41)
Albumin: 3.8 g/dL (ref 3.5–5.0)
Alkaline Phosphatase: 103 U/L (ref 38–126)
Anion gap: 16 — ABNORMAL HIGH (ref 5–15)
BUN: 5 mg/dL — ABNORMAL LOW (ref 6–20)
CO2: 22 mmol/L (ref 22–32)
Calcium: 8.9 mg/dL (ref 8.9–10.3)
Chloride: 98 mmol/L (ref 98–111)
Creatinine, Ser: 0.79 mg/dL (ref 0.61–1.24)
GFR calc Af Amer: >60 mL/min (ref >60)
GFR calc non Af Amer: >60 mL/min (ref >60)
Glucose, Bld: 93 mg/dL (ref 70–99)
Potassium: 3.1 mmol/L — ABNORMAL LOW (ref 3.5–5.1)
Sodium: 136 mmol/L (ref 135–145)
Total Bilirubin: 0.8 mg/dL (ref 0.3–1.2)
Total Protein: 7.9 g/dL (ref 6.5–8.1)

## 2019-10-29 LAB — CBC
HCT: 42.4 % (ref 39.0–52.0)
Hemoglobin: 13.3 g/dL (ref 13.0–17.0)
MCH: 30.2 pg (ref 26.0–34.0)
MCHC: 31.4 g/dL (ref 30.0–36.0)
MCV: 96.1 fL (ref 80.0–100.0)
Platelets: 127 10*3/uL — ABNORMAL LOW (ref 150–400)
RBC: 4.41 MIL/uL (ref 4.22–5.81)
RDW: 13.7 % (ref 11.5–15.5)
WBC: 4.1 10*3/uL (ref 4.0–10.5)
nRBC: 0 % (ref 0.0–0.2)

## 2019-10-29 LAB — ETHANOL: Alcohol, Ethyl (B): 218 mg/dL — ABNORMAL HIGH (ref <10)
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
## 2019-10-30 ENCOUNTER — Other Ambulatory Visit: Payer: Self-pay

## 2019-10-30 MED ORDER — POTASSIUM CHLORIDE CRYS ER 20 MEQ PO TBCR: 20.0000 meq | EXTENDED_RELEASE_TABLET | Freq: Every day | ORAL | 0 refills | Status: DC

## 2019-10-30 MED ORDER — POTASSIUM CHLORIDE CRYS ER 20 MEQ PO TBCR
40.0000 meq | EXTENDED_RELEASE_TABLET | Freq: Once | ORAL | Status: AC
  Administered 2019-10-30: 40 meq via ORAL
  Filled 2019-10-30: qty 2

## 2019-10-30 NOTE — ED Provider Notes (Signed)
[facility] EMERGENCY DEPARTMENT Provider Note   CSN: [id number] Arrival date & time: 10/29/19  2222     History Chief Complaint  Patient presents with  . Alcohol Intoxication  . Fall    Creg Compean is a 54 y.o. male.  HPI 54 year old male presents for alcohol intoxication.  Patient states he was intoxicated last night and fell in the grass.  Police picked him up and brought him in here because of his fall.  However he has no complaints and did not injure himself.  He states he has been drinking a lot more than typical recently.  He is noted to have high blood pressure and states that his blood pressure always goes up when he drinks.  He specifically denies any headache or extremity injury.   Past Medical History:  Diagnosis Date  . HIV (human immunodeficiency virus infection) (HCC) 1995    Patient Active Problem List   Diagnosis Date Noted  . HIV (human immunodeficiency virus infection) (HCC) 02/18/2018  . Healthcare maintenance 02/18/2018  . Hypertension 02/18/2018    Past Surgical History:  Procedure Laterality Date  . NO PAST SURGERIES         Family History  Problem Relation Age of Onset  . Hypertension Mother   . Throat cancer Mother   . Lung cancer Father   . Cancer Maternal Grandfather     Social History   Tobacco Use  . Smoking status: Never Smoker  . Smokeless tobacco: Never Used  Substance Use Topics  . Alcohol use: Yes    Comment: occasionally  . Drug use: No    Home Medications Prior to Admission medications   Medication Sig Start Date End Date Taking? Authorizing Provider  bictegravir-emtricitabine-tenofovir AF (BIKTARVY) 50-200-25 MG TABS tablet Take 1 tablet by mouth daily. 02/18/18   Blanchard Kelch, NP  erythromycin ophthalmic ointment Place a 1/2 inch ribbon of ointment into the lower eyelid. Patient not taking: Reported on 02/18/2018 04/15/17   Hedges, Tinnie Gens, PA-C  HYDROcodone-acetaminophen (NORCO/VICODIN)  5-325 MG per tablet Take 1-2 tablets by mouth every 6 (six) hours as needed for pain. Patient not taking: Reported on 02/18/2018 06/21/12   Jones Skene, MD  oxyCODONE-acetaminophen (PERCOCET/ROXICET) 5-325 MG tablet Take 1 tablet by mouth every 4 (four) hours as needed for severe pain. Patient not taking: Reported on 02/18/2018 04/15/17   Hedges, Tinnie Gens, PA-C  potassium chloride SA (KLOR-CON) 20 MEQ tablet Take 1 tablet (20 mEq total) by mouth daily. 10/30/19   Pricilla Loveless, MD    Allergies    Tomato  Review of Systems   Review of Systems  Cardiovascular: Negative for chest pain.  Musculoskeletal: Negative for arthralgias and myalgias.  Neurological: Negative for headaches.  All other systems reviewed and are negative.   Physical Exam Updated Vital Signs BP (!) 158/80 (BP Location: Right Arm)   Pulse 68   Temp 98.7 F (37.1 C) (Oral)   Resp 17   Ht 5\' 6"  (1.676 m)   Wt 78.9 kg   SpO2 99%   BMI 28.08 kg/m   Physical Exam Vitals and nursing note reviewed.  Constitutional:      General: He is not in acute distress.    Appearance: He is well-developed. He is not ill-appearing or diaphoretic.  HENT:     Head: Normocephalic and atraumatic.     Right Ear: External ear normal.     Left Ear: External ear normal.     Nose: Nose normal.  Eyes:  General:        Right eye: No discharge.        Left eye: No discharge.     Extraocular Movements: Extraocular movements intact.     Pupils: Pupils are equal, round, and reactive to light.  Cardiovascular:     Rate and Rhythm: Normal rate and regular rhythm.     Heart sounds: Normal heart sounds.  Pulmonary:     Effort: Pulmonary effort is normal.     Breath sounds: Normal breath sounds.  Abdominal:     General: There is no distension.     Palpations: Abdomen is soft.     Tenderness: There is no abdominal tenderness.  Musculoskeletal:     Cervical back: Neck supple.  Skin:    General: Skin is warm and dry.  Neurological:      Mental Status: He is alert.     Comments: CN 3-12 grossly intact. 5/5 strength in all 4 extremities. Grossly normal sensation. Normal finger to nose.   Psychiatric:        Mood and Affect: Mood is not anxious.     ED Results / Procedures / Treatments   Labs (all labs ordered are listed, but only abnormal results are displayed) Labs Reviewed  COMPREHENSIVE METABOLIC PANEL - Abnormal; Notable for the following components:      Result Value   Potassium 3.1 (*)    BUN 5 (*)    AST 81 (*)    ALT 66 (*)    Anion gap 16 (*)    All other components within normal limits  ETHANOL - Abnormal; Notable for the following components:   Alcohol, Ethyl (B) 218 (*)    All other components within normal limits  CBC - Abnormal; Notable for the following components:   Platelets 127 (*)    All other components within normal limits  RAPID URINE DRUG SCREEN, HOSP PERFORMED    EKG None  Radiology No results found.  Procedures Procedures (including critical care time)  Medications Ordered in ED Medications  potassium chloride SA (KLOR-CON) CR tablet 40 mEq (40 mEq Oral Given 10/30/19 0850)    ED Course  I have reviewed the triage vital signs and the nursing notes.  Pertinent labs & imaging results that were available during my care of the patient were reviewed by me and considered in my medical decision making (see chart for details).    MDM Rules/Calculators/A&P                          Patient has been waiting in the waiting room all night and currently has no complaints.  He is not intoxicated.  He does have a mild hypokalemia that will be repleted.  Otherwise his blood pressure is elevated today though on chart review its not always elevated so I think it is reasonable to have him follow-up with his PCP and discuss whether or not to put him on blood pressure medicines. Final Clinical Impression(s) / ED Diagnoses Final diagnoses:  Alcohol abuse  Elevated blood pressure reading   Hypokalemia    Rx / DC Orders ED Discharge Orders         Ordered    potassium chloride SA (KLOR-CON) 20 MEQ tablet  Daily        10/30/19 0109           Pricilla Loveless, MD 10/30/19 [phone]

## 2019-11-01 ENCOUNTER — Emergency Department (HOSPITAL_COMMUNITY): Payer: Medicaid Other

## 2019-11-01 ENCOUNTER — Encounter (HOSPITAL_COMMUNITY): Payer: Self-pay | Admitting: *Deleted

## 2019-11-01 ENCOUNTER — Inpatient Hospital Stay (HOSPITAL_COMMUNITY)
Admission: EM | Admit: 2019-11-01 | Discharge: 2020-04-08 | DRG: 004 | Disposition: A | Discharge status: Skilled Nursing Facility | Payer: Medicaid Other | Attending: Internal Medicine | Admitting: Internal Medicine

## 2019-11-01 ENCOUNTER — Inpatient Hospital Stay (HOSPITAL_COMMUNITY): Payer: Medicaid Other

## 2019-11-01 ENCOUNTER — Other Ambulatory Visit: Payer: Self-pay

## 2019-11-01 DIAGNOSIS — Z20822 Contact with and (suspected) exposure to covid-19: Secondary | ICD-10-CM | POA: Diagnosis present

## 2019-11-01 DIAGNOSIS — J9811 Atelectasis: Secondary | ICD-10-CM | POA: Diagnosis present

## 2019-11-01 DIAGNOSIS — K9423 Gastrostomy malfunction: Secondary | ICD-10-CM | POA: Diagnosis not present

## 2019-11-01 DIAGNOSIS — Z0189 Encounter for other specified special examinations: Secondary | ICD-10-CM

## 2019-11-01 DIAGNOSIS — J9610 Chronic respiratory failure, unspecified whether with hypoxia or hypercapnia: Secondary | ICD-10-CM

## 2019-11-01 DIAGNOSIS — Y95 Nosocomial condition: Secondary | ICD-10-CM | POA: Diagnosis not present

## 2019-11-01 DIAGNOSIS — K4021 Bilateral inguinal hernia, without obstruction or gangrene, recurrent: Secondary | ICD-10-CM | POA: Diagnosis present

## 2019-11-01 DIAGNOSIS — R4702 Dysphasia: Secondary | ICD-10-CM | POA: Diagnosis present

## 2019-11-01 DIAGNOSIS — B20 Human immunodeficiency virus [HIV] disease: Secondary | ICD-10-CM | POA: Diagnosis present

## 2019-11-01 DIAGNOSIS — B948 Sequelae of other specified infectious and parasitic diseases: Secondary | ICD-10-CM

## 2019-11-01 DIAGNOSIS — E872 Acidosis, unspecified: Secondary | ICD-10-CM

## 2019-11-01 DIAGNOSIS — R1110 Vomiting, unspecified: Secondary | ICD-10-CM | POA: Diagnosis present

## 2019-11-01 DIAGNOSIS — I469 Cardiac arrest, cause unspecified: Secondary | ICD-10-CM | POA: Diagnosis present

## 2019-11-01 DIAGNOSIS — Z9911 Dependence on respirator [ventilator] status: Secondary | ICD-10-CM | POA: Diagnosis not present

## 2019-11-01 DIAGNOSIS — R4020 Unspecified coma: Secondary | ICD-10-CM | POA: Diagnosis present

## 2019-11-01 DIAGNOSIS — L8990 Pressure ulcer of unspecified site, unspecified stage: Secondary | ICD-10-CM | POA: Diagnosis present

## 2019-11-01 DIAGNOSIS — K4000 Bilateral inguinal hernia, with obstruction, without gangrene, not specified as recurrent: Secondary | ICD-10-CM | POA: Diagnosis present

## 2019-11-01 DIAGNOSIS — M62838 Other muscle spasm: Secondary | ICD-10-CM

## 2019-11-01 DIAGNOSIS — R4701 Aphasia: Secondary | ICD-10-CM | POA: Diagnosis not present

## 2019-11-01 DIAGNOSIS — G8250 Quadriplegia, unspecified: Secondary | ICD-10-CM | POA: Diagnosis not present

## 2019-11-01 DIAGNOSIS — R451 Restlessness and agitation: Secondary | ICD-10-CM | POA: Diagnosis not present

## 2019-11-01 DIAGNOSIS — R159 Full incontinence of feces: Secondary | ICD-10-CM | POA: Diagnosis present

## 2019-11-01 DIAGNOSIS — T424X1A Poisoning by benzodiazepines, accidental (unintentional), initial encounter: Principal | ICD-10-CM | POA: Diagnosis present

## 2019-11-01 DIAGNOSIS — G894 Chronic pain syndrome: Secondary | ICD-10-CM | POA: Diagnosis present

## 2019-11-01 DIAGNOSIS — R569 Unspecified convulsions: Secondary | ICD-10-CM

## 2019-11-01 DIAGNOSIS — L89152 Pressure ulcer of sacral region, stage 2: Secondary | ICD-10-CM | POA: Diagnosis not present

## 2019-11-01 DIAGNOSIS — G9341 Metabolic encephalopathy: Secondary | ICD-10-CM | POA: Diagnosis present

## 2019-11-01 DIAGNOSIS — N390 Urinary tract infection, site not specified: Secondary | ICD-10-CM | POA: Diagnosis not present

## 2019-11-01 DIAGNOSIS — L89522 Pressure ulcer of left ankle, stage 2: Secondary | ICD-10-CM | POA: Diagnosis not present

## 2019-11-01 DIAGNOSIS — R0600 Dyspnea, unspecified: Secondary | ICD-10-CM

## 2019-11-01 DIAGNOSIS — R270 Ataxia, unspecified: Secondary | ICD-10-CM | POA: Diagnosis not present

## 2019-11-01 DIAGNOSIS — L89893 Pressure ulcer of other site, stage 3: Secondary | ICD-10-CM | POA: Diagnosis not present

## 2019-11-01 DIAGNOSIS — Z23 Encounter for immunization: Secondary | ICD-10-CM

## 2019-11-01 DIAGNOSIS — F919 Conduct disorder, unspecified: Secondary | ICD-10-CM | POA: Diagnosis not present

## 2019-11-01 DIAGNOSIS — Z91018 Allergy to other foods: Secondary | ICD-10-CM

## 2019-11-01 DIAGNOSIS — Z818 Family history of other mental and behavioral disorders: Secondary | ICD-10-CM

## 2019-11-01 DIAGNOSIS — J9621 Acute and chronic respiratory failure with hypoxia: Secondary | ICD-10-CM | POA: Diagnosis present

## 2019-11-01 DIAGNOSIS — R4189 Other symptoms and signs involving cognitive functions and awareness: Secondary | ICD-10-CM | POA: Diagnosis present

## 2019-11-01 DIAGNOSIS — Z515 Encounter for palliative care: Secondary | ICD-10-CM

## 2019-11-01 DIAGNOSIS — Z87440 Personal history of urinary (tract) infections: Secondary | ICD-10-CM

## 2019-11-01 DIAGNOSIS — Z66 Do not resuscitate: Secondary | ICD-10-CM | POA: Diagnosis not present

## 2019-11-01 DIAGNOSIS — Y906 Blood alcohol level of 120-199 mg/100 ml: Secondary | ICD-10-CM | POA: Diagnosis present

## 2019-11-01 DIAGNOSIS — K56609 Unspecified intestinal obstruction, unspecified as to partial versus complete obstruction: Secondary | ICD-10-CM

## 2019-11-01 DIAGNOSIS — F1290 Cannabis use, unspecified, uncomplicated: Secondary | ICD-10-CM | POA: Diagnosis present

## 2019-11-01 DIAGNOSIS — Z79899 Other long term (current) drug therapy: Secondary | ICD-10-CM

## 2019-11-01 DIAGNOSIS — R7401 Elevation of levels of liver transaminase levels: Secondary | ICD-10-CM | POA: Diagnosis present

## 2019-11-01 DIAGNOSIS — J189 Pneumonia, unspecified organism: Secondary | ICD-10-CM | POA: Diagnosis present

## 2019-11-01 DIAGNOSIS — Z8249 Family history of ischemic heart disease and other diseases of the circulatory system: Secondary | ICD-10-CM

## 2019-11-01 DIAGNOSIS — K4020 Bilateral inguinal hernia, without obstruction or gangrene, not specified as recurrent: Secondary | ICD-10-CM | POA: Diagnosis present

## 2019-11-01 DIAGNOSIS — B370 Candidal stomatitis: Secondary | ICD-10-CM | POA: Diagnosis not present

## 2019-11-01 DIAGNOSIS — K56600 Partial intestinal obstruction, unspecified as to cause: Secondary | ICD-10-CM | POA: Diagnosis not present

## 2019-11-01 DIAGNOSIS — R739 Hyperglycemia, unspecified: Secondary | ICD-10-CM | POA: Diagnosis not present

## 2019-11-01 DIAGNOSIS — I10 Essential (primary) hypertension: Secondary | ICD-10-CM | POA: Diagnosis present

## 2019-11-01 DIAGNOSIS — R1310 Dysphagia, unspecified: Secondary | ICD-10-CM

## 2019-11-01 DIAGNOSIS — I959 Hypotension, unspecified: Secondary | ICD-10-CM | POA: Diagnosis not present

## 2019-11-01 DIAGNOSIS — E860 Dehydration: Secondary | ICD-10-CM | POA: Diagnosis not present

## 2019-11-01 DIAGNOSIS — D72829 Elevated white blood cell count, unspecified: Secondary | ICD-10-CM

## 2019-11-01 DIAGNOSIS — Z6828 Body mass index (BMI) 28.0-28.9, adult: Secondary | ICD-10-CM

## 2019-11-01 DIAGNOSIS — R54 Age-related physical debility: Secondary | ICD-10-CM | POA: Diagnosis present

## 2019-11-01 DIAGNOSIS — I5189 Other ill-defined heart diseases: Secondary | ICD-10-CM

## 2019-11-01 DIAGNOSIS — D72819 Decreased white blood cell count, unspecified: Secondary | ICD-10-CM | POA: Diagnosis present

## 2019-11-01 DIAGNOSIS — G931 Anoxic brain damage, not elsewhere classified: Secondary | ICD-10-CM | POA: Diagnosis present

## 2019-11-01 DIAGNOSIS — Y92512 Supermarket, store or market as the place of occurrence of the external cause: Secondary | ICD-10-CM

## 2019-11-01 DIAGNOSIS — N433 Hydrocele, unspecified: Secondary | ICD-10-CM | POA: Diagnosis present

## 2019-11-01 DIAGNOSIS — F10129 Alcohol abuse with intoxication, unspecified: Secondary | ICD-10-CM | POA: Diagnosis present

## 2019-11-01 DIAGNOSIS — G249 Dystonia, unspecified: Secondary | ICD-10-CM | POA: Diagnosis not present

## 2019-11-01 DIAGNOSIS — F10139 Alcohol abuse with withdrawal, unspecified: Secondary | ICD-10-CM | POA: Diagnosis present

## 2019-11-01 DIAGNOSIS — R509 Fever, unspecified: Secondary | ICD-10-CM

## 2019-11-01 DIAGNOSIS — E46 Unspecified protein-calorie malnutrition: Secondary | ICD-10-CM

## 2019-11-01 DIAGNOSIS — B952 Enterococcus as the cause of diseases classified elsewhere: Secondary | ICD-10-CM

## 2019-11-01 DIAGNOSIS — E43 Unspecified severe protein-calorie malnutrition: Secondary | ICD-10-CM | POA: Diagnosis not present

## 2019-11-01 DIAGNOSIS — R8290 Unspecified abnormal findings in urine: Secondary | ICD-10-CM

## 2019-11-01 DIAGNOSIS — G40901 Epilepsy, unspecified, not intractable, with status epilepticus: Secondary | ICD-10-CM | POA: Diagnosis present

## 2019-11-01 DIAGNOSIS — G479 Sleep disorder, unspecified: Secondary | ICD-10-CM | POA: Diagnosis present

## 2019-11-01 DIAGNOSIS — J9602 Acute respiratory failure with hypercapnia: Secondary | ICD-10-CM

## 2019-11-01 DIAGNOSIS — E440 Moderate protein-calorie malnutrition: Secondary | ICD-10-CM | POA: Insufficient documentation

## 2019-11-01 DIAGNOSIS — F1010 Alcohol abuse, uncomplicated: Secondary | ICD-10-CM

## 2019-11-01 DIAGNOSIS — Z7401 Bed confinement status: Secondary | ICD-10-CM

## 2019-11-01 DIAGNOSIS — J9503 Malfunction of tracheostomy stoma: Secondary | ICD-10-CM | POA: Diagnosis not present

## 2019-11-01 DIAGNOSIS — J9601 Acute respiratory failure with hypoxia: Secondary | ICD-10-CM | POA: Diagnosis present

## 2019-11-01 DIAGNOSIS — D696 Thrombocytopenia, unspecified: Secondary | ICD-10-CM | POA: Diagnosis not present

## 2019-11-01 DIAGNOSIS — E876 Hypokalemia: Secondary | ICD-10-CM | POA: Diagnosis not present

## 2019-11-01 DIAGNOSIS — N5089 Other specified disorders of the male genital organs: Secondary | ICD-10-CM

## 2019-11-01 DIAGNOSIS — J40 Bronchitis, not specified as acute or chronic: Secondary | ICD-10-CM

## 2019-11-01 DIAGNOSIS — R0602 Shortness of breath: Secondary | ICD-10-CM

## 2019-11-01 DIAGNOSIS — Z930 Tracheostomy status: Secondary | ICD-10-CM

## 2019-11-01 HISTORY — DX: Alcohol abuse, uncomplicated: F10.10

## 2019-11-01 LAB — RESPIRATORY PANEL BY RT PCR (FLU A&B, COVID)
Influenza A by PCR: NEGATIVE
Influenza B by PCR: NEGATIVE
SARS Coronavirus 2 by RT PCR: NEGATIVE

## 2019-11-01 LAB — BASIC METABOLIC PANEL
Anion gap: 21 — ABNORMAL HIGH (ref 5–15)
Anion gap: 17 — ABNORMAL HIGH (ref 5–15)
BUN: 11 mg/dL (ref 6–20)
BUN: 13 mg/dL (ref 6–20)
CO2: 16 mmol/L — ABNORMAL LOW (ref 22–32)
CO2: 17 mmol/L — ABNORMAL LOW (ref 22–32)
Calcium: 8.7 mg/dL — ABNORMAL LOW (ref 8.9–10.3)
Calcium: 8.2 mg/dL — ABNORMAL LOW (ref 8.9–10.3)
Chloride: 102 mmol/L (ref 98–111)
Chloride: 104 mmol/L (ref 98–111)
Creatinine, Ser: 1.23 mg/dL (ref 0.61–1.24)
Creatinine, Ser: 0.86 mg/dL (ref 0.61–1.24)
GFR calc Af Amer: >60 mL/min (ref >60)
GFR calc Af Amer: >60 mL/min (ref >60)
GFR calc non Af Amer: >60 mL/min (ref >60)
GFR calc non Af Amer: >60 mL/min (ref >60)
Glucose, Bld: 125 mg/dL — ABNORMAL HIGH (ref 70–99)
Glucose, Bld: 132 mg/dL — ABNORMAL HIGH (ref 70–99)
Potassium: 3.9 mmol/L (ref 3.5–5.1)
Potassium: 4.2 mmol/L (ref 3.5–5.1)
Sodium: 139 mmol/L (ref 135–145)
Sodium: 138 mmol/L (ref 135–145)

## 2019-11-01 LAB — I-STAT ARTERIAL BLOOD GAS, ED
Acid-base deficit: 7 mmol/L — ABNORMAL HIGH (ref 0.0–2.0)
Acid-base deficit: 7 mmol/L — ABNORMAL HIGH (ref 0.0–2.0)
Bicarbonate: 21.2 mmol/L (ref 20.0–28.0)
Bicarbonate: 20.5 mmol/L (ref 20.0–28.0)
Calcium, Ion: 1.14 mmol/L — ABNORMAL LOW (ref 1.15–1.40)
Calcium, Ion: 1.17 mmol/L (ref 1.15–1.40)
HCT: 41 % (ref 39.0–52.0)
HCT: 39 % (ref 39.0–52.0)
Hemoglobin: 13.9 g/dL (ref 13.0–17.0)
Hemoglobin: 13.3 g/dL (ref 13.0–17.0)
O2 Saturation: 100 %
O2 Saturation: 98 %
Patient temperature: 98.8
Potassium: 3.8 mmol/L (ref 3.5–5.1)
Potassium: 3.4 mmol/L — ABNORMAL LOW (ref 3.5–5.1)
Sodium: 139 mmol/L (ref 135–145)
Sodium: 140 mmol/L (ref 135–145)
TCO2: 23 mmol/L (ref 22–32)
TCO2: 22 mmol/L (ref 22–32)
pCO2 arterial: 50.5 mmHg — ABNORMAL HIGH (ref 32.0–48.0)
pCO2 arterial: 46.4 mmHg (ref 32.0–48.0)
pH, Arterial: 7.232 — ABNORMAL LOW (ref 7.350–7.450)
pH, Arterial: 7.253 — ABNORMAL LOW (ref 7.350–7.450)
pO2, Arterial: 301 mmHg — ABNORMAL HIGH (ref 83.0–108.0)
pO2, Arterial: 122 mmHg — ABNORMAL HIGH (ref 83.0–108.0)

## 2019-11-01 LAB — RAPID URINE DRUG SCREEN, HOSP PERFORMED
Amphetamines: NOT DETECTED
Barbiturates: NOT DETECTED
Benzodiazepines: POSITIVE — AB
Cocaine: NOT DETECTED
Opiates: NOT DETECTED
Tetrahydrocannabinol: POSITIVE — AB

## 2019-11-01 LAB — CBC
HCT: 43.6 % (ref 39.0–52.0)
HCT: 45.6 % (ref 39.0–52.0)
Hemoglobin: 13.5 g/dL (ref 13.0–17.0)
Hemoglobin: 13.6 g/dL (ref 13.0–17.0)
MCH: 30.3 pg (ref 26.0–34.0)
MCH: 30 pg (ref 26.0–34.0)
MCHC: 31 g/dL (ref 30.0–36.0)
MCHC: 29.8 g/dL — ABNORMAL LOW (ref 30.0–36.0)
MCV: 98 fL (ref 80.0–100.0)
MCV: 100.4 fL — ABNORMAL HIGH (ref 80.0–100.0)
Platelets: 167 10*3/uL (ref 150–400)
Platelets: 142 10*3/uL — ABNORMAL LOW (ref 150–400)
RBC: 4.45 MIL/uL (ref 4.22–5.81)
RBC: 4.54 MIL/uL (ref 4.22–5.81)
RDW: 13.7 % (ref 11.5–15.5)
RDW: 14.1 % (ref 11.5–15.5)
WBC: 6.4 10*3/uL (ref 4.0–10.5)
WBC: 7.6 10*3/uL (ref 4.0–10.5)
nRBC: 0 % (ref 0.0–0.2)
nRBC: 0 % (ref 0.0–0.2)

## 2019-11-01 LAB — I-STAT CHEM 8, ED
BUN: 11 mg/dL (ref 6–20)
Calcium, Ion: 0.91 mmol/L — ABNORMAL LOW (ref 1.15–1.40)
Chloride: 104 mmol/L (ref 98–111)
Creatinine, Ser: 1.5 mg/dL — ABNORMAL HIGH (ref 0.61–1.24)
Glucose, Bld: 117 mg/dL — ABNORMAL HIGH (ref 70–99)
HCT: 43 % (ref 39.0–52.0)
Hemoglobin: 14.6 g/dL (ref 13.0–17.0)
Potassium: 3.8 mmol/L (ref 3.5–5.1)
Sodium: 138 mmol/L (ref 135–145)
TCO2: 17 mmol/L — ABNORMAL LOW (ref 22–32)

## 2019-11-01 LAB — URINALYSIS, ROUTINE W REFLEX MICROSCOPIC
Bilirubin Urine: NEGATIVE
Glucose, UA: NEGATIVE mg/dL
Ketones, ur: NEGATIVE mg/dL
Nitrite: NEGATIVE
Protein, ur: 100 mg/dL — AB
Specific Gravity, Urine: 1.018 (ref 1.005–1.030)
pH: 5 (ref 5.0–8.0)

## 2019-11-01 LAB — HEPATIC FUNCTION PANEL
ALT: 63 U/L — ABNORMAL HIGH (ref 0–44)
AST: 177 U/L — ABNORMAL HIGH (ref 15–41)
Albumin: 3.3 g/dL — ABNORMAL LOW (ref 3.5–5.0)
Alkaline Phosphatase: 132 U/L — ABNORMAL HIGH (ref 38–126)
Bilirubin, Direct: 0.2 mg/dL (ref 0.0–0.2)
Indirect Bilirubin: 0.8 mg/dL (ref 0.3–0.9)
Total Bilirubin: 1 mg/dL (ref 0.3–1.2)
Total Protein: 7.1 g/dL (ref 6.5–8.1)

## 2019-11-01 LAB — PROCALCITONIN: Procalcitonin: 1.04 ng/mL

## 2019-11-01 LAB — TROPONIN I (HIGH SENSITIVITY)
Troponin I (High Sensitivity): 64 ng/L — ABNORMAL HIGH (ref <18)
Troponin I (High Sensitivity): 450 ng/L (ref <18)

## 2019-11-01 LAB — LACTIC ACID, PLASMA
Lactic Acid, Venous: 4.4 mmol/L (ref 0.5–1.9)
Lactic Acid, Venous: 3.2 mmol/L (ref 0.5–1.9)

## 2019-11-01 LAB — GLUCOSE, CAPILLARY: Glucose-Capillary: 152 mg/dL — ABNORMAL HIGH (ref 70–99)

## 2019-11-01 LAB — PROTIME-INR
INR: 1.1 (ref 0.8–1.2)
Prothrombin Time: 13.4 seconds (ref 11.4–15.2)

## 2019-11-01 LAB — ETHANOL: Alcohol, Ethyl (B): 177 mg/dL — ABNORMAL HIGH (ref <10)

## 2019-11-01 LAB — LIPASE, BLOOD: Lipase: 48 U/L (ref 11–51)

## 2019-11-01 MED ORDER — HEPARIN SODIUM (PORCINE) 5000 UNIT/ML IJ SOLN
5000.0000 [IU] | Freq: Three times a day (TID) | INTRAMUSCULAR | Status: AC
  Administered 2019-11-01 – 2019-11-15 (×42): 5000 [IU] via SUBCUTANEOUS
  Filled 2019-11-01 (×41): qty 1

## 2019-11-01 MED ORDER — MIDAZOLAM 50MG/50ML (1MG/ML) PREMIX INFUSION
0.5000 mg/h | INTRAVENOUS | Status: DC
  Administered 2019-11-01: 2 mg/h via INTRAVENOUS
  Administered 2019-11-02 (×2): 20 mg/h via INTRAVENOUS
  Administered 2019-11-02: 10 mg/h via INTRAVENOUS
  Filled 2019-11-01 (×4): qty 50

## 2019-11-01 MED ORDER — SODIUM CHLORIDE 0.9 % IV SOLN: INTRAVENOUS | Status: DC

## 2019-11-01 MED ORDER — LORAZEPAM 2 MG/ML IJ SOLN
2.0000 mg | Freq: Once | INTRAMUSCULAR | Status: AC
  Administered 2019-11-01: 2 mg via INTRAVENOUS
  Filled 2019-11-01: qty 1

## 2019-11-01 MED ORDER — FOLIC ACID 5 MG/ML IJ SOLN
1.0000 mg | Freq: Every day | INTRAMUSCULAR | Status: DC
  Administered 2019-11-02 – 2019-11-07 (×6): 1 mg via INTRAVENOUS
  Filled 2019-11-01 (×7): qty 0.2

## 2019-11-01 MED ORDER — PANTOPRAZOLE SODIUM 40 MG IV SOLR
40.0000 mg | Freq: Every day | INTRAVENOUS | Status: DC
  Administered 2019-11-01 – 2019-11-07 (×7): 40 mg via INTRAVENOUS
  Filled 2019-11-01 (×7): qty 40

## 2019-11-01 MED ORDER — NOREPINEPHRINE 4 MG/250ML-% IV SOLN: 0.0000 ug/min | INTRAVENOUS | Status: DC

## 2019-11-01 MED ORDER — ASPIRIN 300 MG RE SUPP
300.0000 mg | RECTAL | Status: AC
  Administered 2019-11-01: 300 mg via RECTAL
  Filled 2019-11-01: qty 1

## 2019-11-01 MED ORDER — SODIUM CHLORIDE 0.9 % IV SOLN
2.0000 g | Freq: Two times a day (BID) | INTRAVENOUS | Status: DC
  Administered 2019-11-01: 2 g via INTRAVENOUS
  Filled 2019-11-01: qty 2

## 2019-11-01 MED ORDER — SODIUM CHLORIDE 0.9 % IV SOLN
500.0000 mg | Freq: Once | INTRAVENOUS | Status: AC
  Administered 2019-11-01: 500 mg via INTRAVENOUS
  Filled 2019-11-01: qty 500

## 2019-11-01 MED ORDER — THIAMINE HCL 100 MG/ML IJ SOLN
100.0000 mg | Freq: Every day | INTRAMUSCULAR | Status: DC
  Administered 2019-11-01 – 2019-11-07 (×7): 100 mg via INTRAVENOUS
  Filled 2019-11-01 (×7): qty 2

## 2019-11-01 MED ORDER — DOCUSATE SODIUM 100 MG PO CAPS: 100.0000 mg | ORAL_CAPSULE | Freq: Two times a day (BID) | ORAL | Status: DC | PRN

## 2019-11-01 MED ORDER — MIDAZOLAM HCL 2 MG/2ML IJ SOLN
4.0000 mg | Freq: Once | INTRAMUSCULAR | Status: AC
  Administered 2019-11-01: 4 mg via INTRAVENOUS
  Filled 2019-11-01: qty 4

## 2019-11-01 MED ORDER — SODIUM CHLORIDE 0.9 % IV SOLN
1.0000 g | Freq: Once | INTRAVENOUS | Status: AC
  Administered 2019-11-01: 1 g via INTRAVENOUS
  Filled 2019-11-01: qty 10

## 2019-11-01 MED ORDER — POLYETHYLENE GLYCOL 3350 17 G PO PACK: 17.0000 g | PACK | Freq: Every day | ORAL | Status: DC | PRN

## 2019-11-01 MED ORDER — VANCOMYCIN HCL 750 MG/150ML IV SOLN
750.0000 mg | Freq: Two times a day (BID) | INTRAVENOUS | Status: DC
  Administered 2019-11-02: 750 mg via INTRAVENOUS
  Filled 2019-11-01: qty 150

## 2019-11-01 MED ORDER — VANCOMYCIN HCL 1500 MG/300ML IV SOLN
1500.0000 mg | Freq: Once | INTRAVENOUS | Status: AC
  Administered 2019-11-01: 1500 mg via INTRAVENOUS
  Filled 2019-11-01: qty 300

## 2019-11-01 NOTE — ED Notes (Signed)
Pt noted to be having possible seizure activity in the ED, Elink called for orders

## 2019-11-01 NOTE — ED Notes (Signed)
[facility] [place] [phone] Brother in [place] ruben Laural [place] [phone] (Brother) [phone]

## 2019-11-01 NOTE — Progress Notes (Signed)
PT transported from ED to 2M03 without any complications.

## 2019-11-01 NOTE — ED Notes (Signed)
Ice pack applied.

## 2019-11-01 NOTE — Progress Notes (Signed)
vLTM setup following baseline  EEG using same leads  Not monitored by [facility]  ER pt.  Event button tested.  Neurology notified

## 2019-11-01 NOTE — Progress Notes (Signed)
Pharmacy Antibiotic Note  Ryan Orozco is a 54 y.o. male admitted on 11/01/2019 with pneumonia.  Pharmacy has been consulted for vancomycin dosing.  Patient was brought in after he was found down, unknown for how long. Starting antibiotics for possible pneumonia. No leukocytosis, afebrile. Kidney function below baseline with SCr 1.5 and CrCl ~56 (BL SCr 0.7-0.8).    Plan: Vancomycin 1,500mg  IV once  Vancomycin 750mg  IV every 12 hours.  Goal trough 15-20 mcg/mL.  Follow up with cultures, antibiotic de-escalation and LOT Monitor renal function and clinical progress  Height: 5\' 6"  (167.6 cm) Weight: 78.9 kg (173 lb 15.1 oz) IBW/kg (Calculated) : 63.8  Temp (24hrs), Avg:97.2 F (36.2 C), Min:96.7 F (35.9 C), Max:97.5 F (36.4 C)  Recent Labs  Lab 10/29/19 2245 11/01/19 1720 11/01/19 1755  WBC 4.1 6.4  --   CREATININE 0.79 1.23 1.50*    Estimated Creatinine Clearance: 55.6 mL/min (A) (by C-G formula based on SCr of 1.5 mg/dL (H)).    Allergies  Allergen Reactions  . Tomato Hives    Antimicrobials this admission: Vancomycin 9/29 >>  Azithromycin 9/29 >>  Ceftriaxone 9/29 >>  Dose adjustments this admission: N/a  Microbiology results: 9/29 BCx: pending  Thank you for allowing pharmacy to be a part of this patient's care.  10/29, PharmD PGY1 Acute Care Pharmacy Resident Please refer to [facility] for unit-specific pharmacist
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
## 2019-11-01 NOTE — Progress Notes (Signed)
EEG complete - results pending 

## 2019-11-01 NOTE — Progress Notes (Addendum)
eLink Physician-Brief Progress Note Patient Name: Ryan Orozco DOB: Jun 21, 1965 MRN: [id number]   Date of Service  11/01/2019  HPI/Events of Note  Patient appears to be having seizures.  eICU Interventions  Versed 4 mg iv x 1 then Versed infusion ordered for sedation on the ventilator.        Thomasene Lot Nattalie Santiesteban 11/01/2019, 10:58 PM
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
## 2019-11-01 NOTE — H&P (Signed)
NAMESamarion Orozco, MRN:  [id number], DOB:  March 08, 1965, LOS: 0 ADMISSION DATE:  11/01/2019, CONSULTATION DATE:  9/29 REFERRING MD:  EDP, CHIEF COMPLAINT:  Cardiac arrest    Brief History   54yo male with hx HIV (last CD4 count 191 on 10/12/19), ETOH with recent ER visit 9/27 after being picked up by police intoxicated. Returned 9/29 after being found down outside convenience store.  Pulseless on EMS arrival with PEA.  Unknown downtime prior to EMS arrival. EMS performed 2 rounds of CPR with 2 epi before ROSC.  PCCM consulted for ICU admission   History of present illness   54yo male with hx HIV, ETOH with recent ER visit 9/27 after being picked up by police intoxicated. Returned 9/29 after being found down outside convenience store.  Pulseless on EMS arrival with PEA.  Unknown downtime prior to EMS arrival. EMS performed 2 rounds of CPR with 2 epi before ROSC, intubated en route.Ryan Orozco  PCCM consulted for ICU admission   In ER lactate 4.4 post CPR, troponin 450, electrolytes wnl, ETOH 177  Past Medical History   has a past medical history of Alcohol abuse and HIV (human immunodeficiency virus infection) (HCC) (1995).   Significant Hospital Events     Consults:    Procedures:    Significant Diagnostic Tests:  CT head 9/29>>> 1. No evidence of acute intracranial abnormality. 2. Moderate generalized cerebral atrophy, advanced for age. 3. Chronic medially displaced fracture deformity of the right lamina papyracea. 4. Mild ethmoid and maxillary sinus mucosal thickening.  CT cervical spine 9/29>>> 1. No evidence of acute fracture to the cervical spine. 2. Partially imaged airspace disease within the imaged lung apices (extensive on the left). Clinical correlation is recommended. 3. 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  EEG 9/29>>>  Micro Data:  BC x 2 9/29>>> Urine 9/29>>> Covid  9/29>>>  Antimicrobials:  Vancomycin 9/29>>> Cefepime 9/29>>>  Interim history/subjective:  Remains unresponsive off sedation. Some myoclonic jerking, dysconjugate gaze.  Family at bedside   Objective   Blood pressure (!) 147/88, pulse 95, temperature 98.7 F (37.1 C), resp. rate 20, height 5\' 6"  (1.676 m), weight 78.9 kg, SpO2 99 %.    Vent Mode: PRVC FiO2 (%):  [40 %-100 %] 40 % Set Rate:  [18 bmp-20 bmp] 20 bmp Vt Set:  [510 mL] 510 mL PEEP:  [5 cmH20-8 cmH20] 5 cmH20 Plateau Pressure:  [20 cmH20] 20 cmH20   Intake/Output Summary (Last 24 hours) at 11/01/2019 2125 Last data filed at 11/01/2019 2051 Gross per 24 hour  Intake 350 ml  Output 275 ml  Net 75 ml   Filed Weights   11/01/19 1842  Weight: 78.9 kg    Examination: General: chronically ill appearing male, acutely ill appearing  HENT: mm moist, ETT Lungs: resps even non labored on vent, coarse L>R Cardiovascular: s1s2 rrr Abdomen: soft, round, hypoactive bs  Extremities: warm and dry, no edema  Neuro: no response on no sedation, myoclonus, dysconjugate gaze, pupils 75mm sluggish GU: foley   Resolved Hospital Problem list     Assessment & Plan:   Cardiac arrest - PEA on EMS arrival. No bystander CPR< unknown downtime.  2 rounds CPR/epi with EMS prior to ROSC.  ?primary pulmonary event, sepsis r/t LUL PNA PLAN -  Normothermia  EKG Echo  Trend troponin  Consider cards input in am   Acute hypoxic respiratory failure  LUL PNA - ?aspiration  in setting heavy ETOH abuse. Hx HIV.  PLAN -  Vent support - 8cc/kg  F/u CXR  F/u ABG Broad spectrum abx as above  Sputum culture    AMS - post arrest.  Myoclonus  PLAN -  Sedation per PAD protocol - PRN ativan for myoclonus +/- seizure activity  EEG  Monitor chem  TTM as above  F/u CT/MRI  Consider neuro input  RASS goal -2  Daily WUA   Hx ETOH abuse  PLAN -  Thiamine  Folate  Monitor for withdrawal  Sedation as above   Hx HIV - reportedly not  taking meds.   PLAN  CD4  Hold home meds for now  Pan culture  Add PCP to sputum culture although less likely given radiologic appearance   Best practice:  Diet: NPO Pain/Anxiety/Delirium protocol (if indicated): PAD protocol  VAP protocol (if indicated): ordered DVT prophylaxis: SQheparin  GI prophylaxis: ordered Glucose control: n/a Mobility: BR Code Status: full Family Communication: updated sister and brother in law at length at bedside 9/19 - expressed concern for overall prognosis.  Disposition:  ICU  Labs   CBC: Recent Labs  Lab 10/29/19 2245 11/01/19 1720 11/01/19 1755 11/01/19 1801  WBC 4.1 6.4  --   --   HGB 13.3 13.5 14.6 13.9  HCT 42.4 43.6 43.0 41.0  MCV 96.1 98.0  --   --   PLT 127* 167  --   --     Basic Metabolic Panel: Recent Labs  Lab 10/29/19 2245 11/01/19 1720 11/01/19 1755 11/01/19 1801  NA 136 139 138 139  K 3.1* 3.9 3.8 3.8  CL 98 102 104  --   CO2 22 16*  --   --   GLUCOSE 93 125* 117*  --   BUN 5* 11 11  --   CREATININE 0.79 1.23 1.50*  --   CALCIUM 8.9 8.7*  --   --    GFR: Estimated Creatinine Clearance: 55.6 mL/min (A) (by C-G formula based on SCr of 1.5 mg/dL (H)). Recent Labs  Lab 10/29/19 2245 11/01/19 1720 11/01/19 1915  WBC 4.1 6.4  --   LATICACIDVEN  --   --  4.4*    Liver Function Tests: Recent Labs  Lab 10/29/19 2245 11/01/19 1915  AST 81* 177*  ALT 66* 63*  ALKPHOS 103 132*  BILITOT 0.8 1.0  PROT 7.9 7.1  ALBUMIN 3.8 3.3*   Recent Labs  Lab 11/01/19 1915  LIPASE 48   No results for input(s): AMMONIA in the last 168 hours.  ABG    Component Value Date/Time   PHART 7.232 (L) 11/01/2019 1801   PCO2ART 50.5 (H) 11/01/2019 1801   PO2ART 301 (H) 11/01/2019 1801   HCO3 21.2 11/01/2019 1801   TCO2 23 11/01/2019 1801   ACIDBASEDEF 7.0 (H) 11/01/2019 1801   O2SAT 100.0 11/01/2019 1801     Coagulation Profile: Recent Labs  Lab 11/01/19 1915  INR 1.1    Cardiac Enzymes: No results for  input(s): CKTOTAL, CKMB, CKMBINDEX, TROPONINI in the last 168 hours.  HbA1C: No results found for: HGBA1C  CBG: No results for input(s): GLUCAP in the last 168 hours.  Review of Systems:   Unable, pt sedated on vent post arrest. As per HPI obtained from records and staff  Past Medical History  He,  has a past medical history of Alcohol abuse and HIV (human immunodeficiency virus infection) (HCC) (1995).   Surgical History    Past Surgical History:  Procedure  Laterality Date  . NO PAST SURGERIES       Social History   reports that he has never smoked. He has never used smokeless tobacco. He reports current alcohol use. He reports that he does not use drugs.   Family History   His family history includes Cancer in his maternal grandfather; Hypertension in his mother; Lung cancer in his father; Throat cancer in his mother.   Allergies Allergies  Allergen Reactions  . Tomato Hives     Home Medications  Prior to Admission medications   Medication Sig Start Date End Date Taking? Authorizing Provider  bictegravir-emtricitabine-tenofovir AF (BIKTARVY) 50-200-25 MG TABS tablet Take 1 tablet by mouth daily. 02/18/18   Blanchard Kelch, NP  erythromycin ophthalmic ointment Place a 1/2 inch ribbon of ointment into the lower eyelid. Patient not taking: Reported on 02/18/2018 04/15/17   Hedges, Tinnie Gens, PA-C  HYDROcodone-acetaminophen (NORCO/VICODIN) 5-325 MG per tablet Take 1-2 tablets by mouth every 6 (six) hours as needed for pain. Patient not taking: Reported on 02/18/2018 06/21/12   Jones Skene, MD  oxyCODONE-acetaminophen (PERCOCET/ROXICET) 5-325 MG tablet Take 1 tablet by mouth every 4 (four) hours as needed for severe pain. Patient not taking: Reported on 02/18/2018 04/15/17   Hedges, Tinnie Gens, PA-C  potassium chloride SA (KLOR-CON) 20 MEQ tablet Take 1 tablet (20 mEq total) by mouth daily. 10/30/19   Pricilla Loveless, MD     Critical care time: 40 mins     Dirk Dress,  NP Pulmonary/Critical Care Medicine  11/01/2019  9:25 PM

## 2019-11-02 ENCOUNTER — Inpatient Hospital Stay (HOSPITAL_COMMUNITY): Payer: Medicaid Other

## 2019-11-02 DIAGNOSIS — I469 Cardiac arrest, cause unspecified: Secondary | ICD-10-CM

## 2019-11-02 DIAGNOSIS — G40901 Epilepsy, unspecified, not intractable, with status epilepticus: Secondary | ICD-10-CM

## 2019-11-02 DIAGNOSIS — R569 Unspecified convulsions: Secondary | ICD-10-CM

## 2019-11-02 LAB — POCT I-STAT 7, (LYTES, BLD GAS, ICA,H+H)
Acid-Base Excess: 0 mmol/L (ref 0.0–2.0)
Bicarbonate: 24.4 mmol/L (ref 20.0–28.0)
Calcium, Ion: 1.09 mmol/L — ABNORMAL LOW (ref 1.15–1.40)
HCT: 44 % (ref 39.0–52.0)
Hemoglobin: 15 g/dL (ref 13.0–17.0)
O2 Saturation: 98 %
Patient temperature: 98.6
Potassium: 3.5 mmol/L (ref 3.5–5.1)
Sodium: 138 mmol/L (ref 135–145)
TCO2: 26 mmol/L (ref 22–32)
pCO2 arterial: 38.7 mmHg (ref 32.0–48.0)
pH, Arterial: 7.408 (ref 7.350–7.450)
pO2, Arterial: 103 mmHg (ref 83.0–108.0)

## 2019-11-02 LAB — BASIC METABOLIC PANEL
Anion gap: 17 — ABNORMAL HIGH (ref 5–15)
Anion gap: 12 (ref 5–15)
Anion gap: 12 (ref 5–15)
BUN: 12 mg/dL (ref 6–20)
BUN: 10 mg/dL (ref 6–20)
BUN: 10 mg/dL (ref 6–20)
CO2: 20 mmol/L — ABNORMAL LOW (ref 22–32)
CO2: 23 mmol/L (ref 22–32)
CO2: 21 mmol/L — ABNORMAL LOW (ref 22–32)
Calcium: 8.3 mg/dL — ABNORMAL LOW (ref 8.9–10.3)
Calcium: 8.3 mg/dL — ABNORMAL LOW (ref 8.9–10.3)
Calcium: 8.1 mg/dL — ABNORMAL LOW (ref 8.9–10.3)
Chloride: 102 mmol/L (ref 98–111)
Chloride: 101 mmol/L (ref 98–111)
Chloride: 102 mmol/L (ref 98–111)
Creatinine, Ser: 0.85 mg/dL (ref 0.61–1.24)
Creatinine, Ser: 0.67 mg/dL (ref 0.61–1.24)
Creatinine, Ser: 0.68 mg/dL (ref 0.61–1.24)
GFR calc Af Amer: >60 mL/min (ref >60)
GFR calc Af Amer: >60 mL/min (ref >60)
GFR calc Af Amer: >60 mL/min (ref >60)
GFR calc non Af Amer: >60 mL/min (ref >60)
GFR calc non Af Amer: >60 mL/min (ref >60)
GFR calc non Af Amer: >60 mL/min (ref >60)
Glucose, Bld: 147 mg/dL — ABNORMAL HIGH (ref 70–99)
Glucose, Bld: 144 mg/dL — ABNORMAL HIGH (ref 70–99)
Glucose, Bld: 149 mg/dL — ABNORMAL HIGH (ref 70–99)
Potassium: 3.3 mmol/L — ABNORMAL LOW (ref 3.5–5.1)
Potassium: 4.2 mmol/L (ref 3.5–5.1)
Potassium: 3.1 mmol/L — ABNORMAL LOW (ref 3.5–5.1)
Sodium: 139 mmol/L (ref 135–145)
Sodium: 136 mmol/L (ref 135–145)
Sodium: 135 mmol/L (ref 135–145)

## 2019-11-02 LAB — CBC
HCT: 46.3 % (ref 39.0–52.0)
Hemoglobin: 14.6 g/dL (ref 13.0–17.0)
MCH: 30.7 pg (ref 26.0–34.0)
MCHC: 31.5 g/dL (ref 30.0–36.0)
MCV: 97.5 fL (ref 80.0–100.0)
Platelets: 146 10*3/uL — ABNORMAL LOW (ref 150–400)
RBC: 4.75 MIL/uL (ref 4.22–5.81)
RDW: 13.9 % (ref 11.5–15.5)
WBC: 6.8 10*3/uL (ref 4.0–10.5)
nRBC: 0 % (ref 0.0–0.2)

## 2019-11-02 LAB — ECHOCARDIOGRAM COMPLETE
Area-P 1/2: 2.5 cm2
Height: 66 in
S' Lateral: 2.49 cm
Weight: 2783.09 oz

## 2019-11-02 LAB — MAGNESIUM: Magnesium: 1.4 mg/dL — ABNORMAL LOW (ref 1.7–2.4)

## 2019-11-02 LAB — PHOSPHORUS: Phosphorus: 3 mg/dL (ref 2.5–4.6)

## 2019-11-02 LAB — PROTIME-INR
INR: 1 (ref 0.8–1.2)
INR: 1 (ref 0.8–1.2)
Prothrombin Time: 13 seconds (ref 11.4–15.2)
Prothrombin Time: 12.9 seconds (ref 11.4–15.2)

## 2019-11-02 LAB — GLUCOSE, CAPILLARY
Glucose-Capillary: 109 mg/dL — ABNORMAL HIGH (ref 70–99)
Glucose-Capillary: 140 mg/dL — ABNORMAL HIGH (ref 70–99)
Glucose-Capillary: 118 mg/dL — ABNORMAL HIGH (ref 70–99)
Glucose-Capillary: 117 mg/dL — ABNORMAL HIGH (ref 70–99)
Glucose-Capillary: 117 mg/dL — ABNORMAL HIGH (ref 70–99)
Glucose-Capillary: 137 mg/dL — ABNORMAL HIGH (ref 70–99)

## 2019-11-02 LAB — LACTIC ACID, PLASMA: Lactic Acid, Venous: 2.4 mmol/L (ref 0.5–1.9)

## 2019-11-02 LAB — APTT
aPTT: 30 seconds (ref 24–36)
aPTT: 29 seconds (ref 24–36)

## 2019-11-02 LAB — MRSA PCR SCREENING: MRSA by PCR: NEGATIVE

## 2019-11-02 MED ORDER — CHLORHEXIDINE GLUCONATE CLOTH 2 % EX PADS
6.0000 | MEDICATED_PAD | Freq: Every day | CUTANEOUS | Status: DC
  Administered 2019-11-01 – 2019-12-21 (×50): 6 via TOPICAL

## 2019-11-02 MED ORDER — LABETALOL HCL 5 MG/ML IV SOLN
10.0000 mg | INTRAVENOUS | Status: AC | PRN
  Administered 2019-11-02 – 2019-11-07 (×5): 20 mg via INTRAVENOUS
  Administered 2019-11-07: 10 mg via INTRAVENOUS
  Administered 2019-11-07 – 2019-11-09 (×4): 20 mg via INTRAVENOUS
  Filled 2019-11-02 (×11): qty 4

## 2019-11-02 MED ORDER — CHLORHEXIDINE GLUCONATE 0.12% ORAL RINSE (MEDLINE KIT)
15.0000 mL | Freq: Two times a day (BID) | OROMUCOSAL | Status: DC
  Administered 2019-11-02 – 2020-04-08 (×327): 15 mL via OROMUCOSAL

## 2019-11-02 MED ORDER — NICARDIPINE HCL IN NACL 20-0.86 MG/200ML-% IV SOLN
0.0000 mg/h | INTRAVENOUS | Status: DC
  Filled 2019-11-02: qty 200

## 2019-11-02 MED ORDER — LEVETIRACETAM IN NACL 500 MG/100ML IV SOLN
500.0000 mg | Freq: Two times a day (BID) | INTRAVENOUS | Status: DC
  Filled 2019-11-02: qty 100

## 2019-11-02 MED ORDER — LEVETIRACETAM IN NACL 1500 MG/100ML IV SOLN
1500.0000 mg | Freq: Once | INTRAVENOUS | Status: AC
  Administered 2019-11-02: 1500 mg via INTRAVENOUS
  Filled 2019-11-02: qty 100

## 2019-11-02 MED ORDER — MAGNESIUM SULFATE 4 GM/100ML IV SOLN
4.0000 g | Freq: Once | INTRAVENOUS | Status: AC
  Administered 2019-11-02: 4 g via INTRAVENOUS
  Filled 2019-11-02: qty 100

## 2019-11-02 MED ORDER — LEVETIRACETAM IN NACL 1500 MG/100ML IV SOLN
1500.0000 mg | Freq: Two times a day (BID) | INTRAVENOUS | Status: DC
  Administered 2019-11-02 – 2019-11-18 (×34): 1500 mg via INTRAVENOUS
  Filled 2019-11-02 (×36): qty 100

## 2019-11-02 MED ORDER — POTASSIUM CHLORIDE 20 MEQ/15ML (10%) PO SOLN
20.0000 meq | ORAL | Status: AC
  Administered 2019-11-02 – 2019-11-03 (×2): 20 meq
  Filled 2019-11-02 (×2): qty 15

## 2019-11-02 MED ORDER — POLYETHYLENE GLYCOL 3350 17 G PO PACK
17.0000 g | PACK | Freq: Every day | ORAL | Status: DC | PRN
  Administered 2019-11-03: 17 g
  Filled 2019-11-02: qty 1

## 2019-11-02 MED ORDER — POTASSIUM CHLORIDE 10 MEQ/100ML IV SOLN
10.0000 meq | INTRAVENOUS | Status: AC
  Administered 2019-11-02 (×4): 10 meq via INTRAVENOUS
  Filled 2019-11-02 (×4): qty 100

## 2019-11-02 MED ORDER — VITAL AF 1.2 CAL PO LIQD
1000.0000 mL | ORAL | Status: DC
  Administered 2019-11-02 – 2019-11-08 (×8): 1000 mL
  Filled 2019-11-02 (×7): qty 1000

## 2019-11-02 MED ORDER — ORAL CARE MOUTH RINSE
15.0000 mL | OROMUCOSAL | Status: DC
  Administered 2019-11-02 – 2019-12-22 (×472): 15 mL via OROMUCOSAL

## 2019-11-02 MED ORDER — POTASSIUM CHLORIDE 20 MEQ/15ML (10%) PO SOLN
20.0000 meq | ORAL | Status: AC
  Administered 2019-11-02 (×2): 20 meq
  Filled 2019-11-02 (×2): qty 15

## 2019-11-02 MED ORDER — SODIUM CHLORIDE 0.9 % IV SOLN
0.5000 mg/h | INTRAVENOUS | Status: DC
  Administered 2019-11-02: 75 mg/h via INTRAVENOUS
  Administered 2019-11-02: 30 mg/h via INTRAVENOUS
  Administered 2019-11-02 – 2019-11-03 (×3): 75 mg/h via INTRAVENOUS
  Administered 2019-11-03 – 2019-11-04 (×4): 80 mg/h via INTRAVENOUS
  Administered 2019-11-05: 75 mg/h via INTRAVENOUS
  Administered 2019-11-05: 60 mg/h via INTRAVENOUS
  Administered 2019-11-05 – 2019-11-06 (×2): 70 mg/h via INTRAVENOUS
  Filled 2019-11-02 (×5): qty 100
  Filled 2019-11-02: qty 50
  Filled 2019-11-02 (×18): qty 100

## 2019-11-02 MED ORDER — PROSOURCE TF PO LIQD
45.0000 mL | Freq: Two times a day (BID) | ORAL | Status: DC
  Administered 2019-11-02 – 2019-11-11 (×17): 45 mL
  Filled 2019-11-02 (×20): qty 45

## 2019-11-02 MED ORDER — DOCUSATE SODIUM 50 MG/5ML PO LIQD
100.0000 mg | Freq: Two times a day (BID) | ORAL | Status: DC | PRN
  Administered 2019-11-07 – 2020-03-22 (×3): 100 mg
  Filled 2019-11-02 (×3): qty 10

## 2019-11-02 MED ORDER — FENTANYL CITRATE (PF) 100 MCG/2ML IJ SOLN
25.0000 ug | INTRAMUSCULAR | Status: DC | PRN
  Administered 2019-11-02: 100 ug via INTRAVENOUS
  Administered 2019-11-03: 25 ug via INTRAVENOUS
  Administered 2019-11-03 – 2019-11-21 (×52): 100 ug via INTRAVENOUS
  Filled 2019-11-02 (×59): qty 2

## 2019-11-02 MED ORDER — PIPERACILLIN-TAZOBACTAM 3.375 G IVPB
3.3750 g | Freq: Three times a day (TID) | INTRAVENOUS | Status: DC
  Administered 2019-11-02 – 2019-11-03 (×4): 3.375 g via INTRAVENOUS
  Filled 2019-11-02 (×6): qty 50

## 2019-11-02 MED ORDER — NICARDIPINE HCL IN NACL 20-0.86 MG/200ML-% IV SOLN
0.0000 mg/h | INTRAVENOUS | Status: DC
  Administered 2019-11-02 (×2): 5 mg/h via INTRAVENOUS
  Administered 2019-11-02: 2.5 mg/h via INTRAVENOUS
  Administered 2019-11-02: 7.5 mg/h via INTRAVENOUS
  Administered 2019-11-03 (×4): 2.5 mg/h via INTRAVENOUS
  Administered 2019-11-04: 3 mg/h via INTRAVENOUS
  Administered 2019-11-04: 5 mg/h via INTRAVENOUS
  Administered 2019-11-04: 3 mg/h via INTRAVENOUS
  Administered 2019-11-04: 5 mg/h via INTRAVENOUS
  Administered 2019-11-04 (×2): 6 mg/h via INTRAVENOUS
  Administered 2019-11-05 (×2): 3 mg/h via INTRAVENOUS
  Administered 2019-11-05: 5 mg/h via INTRAVENOUS
  Administered 2019-11-05: 3 mg/h via INTRAVENOUS
  Administered 2019-11-06: 5 mg/h via INTRAVENOUS
  Administered 2019-11-06: 2 mg/h via INTRAVENOUS
  Administered 2019-11-06: 10 mg/h via INTRAVENOUS
  Administered 2019-11-06: 6 mg/h via INTRAVENOUS
  Administered 2019-11-07: 10 mg/h via INTRAVENOUS
  Administered 2019-11-07: 9 mg/h via INTRAVENOUS
  Administered 2019-11-07: 8 mg/h via INTRAVENOUS
  Administered 2019-11-07: 6 mg/h via INTRAVENOUS
  Administered 2019-11-07: 3 mg/h via INTRAVENOUS
  Administered 2019-11-07: 10 mg/h via INTRAVENOUS
  Administered 2019-11-07: 11.5 mg/h via INTRAVENOUS
  Administered 2019-11-07: 10 mg/h via INTRAVENOUS
  Administered 2019-11-07: 9 mg/h via INTRAVENOUS
  Administered 2019-11-08: 13 mg/h via INTRAVENOUS
  Administered 2019-11-08 (×2): 15 mg/h via INTRAVENOUS
  Administered 2019-11-08: 4 mg/h via INTRAVENOUS
  Administered 2019-11-08: 7 mg/h via INTRAVENOUS
  Administered 2019-11-08: 13 mg/h via INTRAVENOUS
  Administered 2019-11-08: 6 mg/h via INTRAVENOUS
  Administered 2019-11-08 (×4): 15 mg/h via INTRAVENOUS
  Administered 2019-11-09: 13 mg/h via INTRAVENOUS
  Administered 2019-11-09: 6.5 mg/h via INTRAVENOUS
  Administered 2019-11-09: 10 mg/h via INTRAVENOUS
  Administered 2019-11-09: 4 mg/h via INTRAVENOUS
  Filled 2019-11-02 (×49): qty 200

## 2019-11-02 MED ORDER — LEVETIRACETAM IN NACL 1500 MG/100ML IV SOLN
1500.0000 mg | Freq: Two times a day (BID) | INTRAVENOUS | Status: DC
  Administered 2019-11-02: 1500 mg via INTRAVENOUS
  Filled 2019-11-02: qty 100

## 2019-11-02 MED ORDER — CALCIUM GLUCONATE-NACL 1-0.675 GM/50ML-% IV SOLN
1.0000 g | Freq: Once | INTRAVENOUS | Status: AC
  Administered 2019-11-02: 1000 mg via INTRAVENOUS
  Filled 2019-11-02: qty 50

## 2019-11-02 MED ORDER — POTASSIUM CHLORIDE 10 MEQ/100ML IV SOLN
10.0000 meq | INTRAVENOUS | Status: AC
  Administered 2019-11-02 – 2019-11-03 (×4): 10 meq via INTRAVENOUS
  Filled 2019-11-02 (×4): qty 100

## 2019-11-02 NOTE — Progress Notes (Signed)
Checked pt and electrodes for LTM EEG.  Impedances WNL, no skin break down noted.

## 2019-11-02 NOTE — Progress Notes (Signed)
eLink Physician-Brief Progress Note Patient Name: Ryan Orozco DOB: [birth year] MRN: [id number]   Date of Service  11/02/2019  HPI/Events of Note  Neurologist requesting discontinuation of Cefepime if possible, he's concerned it might lower patient's seizure threshold.  eICU Interventions  Cefepime discontinued, and Zosyn substituted.        Paschal Blanton U Keaja Reaume 11/02/2019, 3:12 AM

## 2019-11-02 NOTE — Progress Notes (Signed)
  Echocardiogram 2D Echocardiogram has been performed.  Ryan Orozco Ryan Orozco 11/02/2019, 10:55 AM

## 2019-11-02 NOTE — Progress Notes (Addendum)
Subjective: Patient continues to have myoclonic seizures overnight even after adding Keppra and Versed.  ROS: Unable to obtain due to poor mental status  Examination  Vital signs in last 24 hours: Temp:  [95.2 F (35.1 C)-99.2 F (37.3 C)] 97 F (36.1 C) (09/30 1000) Pulse Rate:  [60-119] 95 (09/30 1000) Resp:  [16-32] 21 (09/30 1000) BP: (99-209)/(60-135) 166/87 (09/30 0200) SpO2:  [88 %-100 %] 100 % (09/30 1000) Arterial Line BP: (114-209)/(49-97) 150/72 (09/30 1000) FiO2 (%):  [40 %-100 %] 40 % (09/30 0355) Weight:  [78.9 kg] 78.9 kg (09/29 1842)  General: lying in bed, frequent myoclonic seizures CVS: pulse-normal rate and rhythm RS: Intubated, coarse breath sounds bilaterally Extremities: normal, warm Neuro: On Versed at 20 mL/h, comatose, does not open eyes to noxious stimuli, pupils myotic and difficult appreciate any reactivity, corneal reflex absent, cough reflex absent, does not withdraw to noxious stimuli in all 4 extremities, frequent myoclonic seizures noted  Basic Metabolic Panel: Recent Labs  Lab 10/29/19 2245 10/29/19 2245 11/01/19 1720 11/01/19 1720 11/01/19 1755 11/01/19 1755 11/01/19 1801 11/01/19 2118 11/01/19 2238 11/02/19 0036 11/02/19 0505  NA 136   < > 139   < > 138   < > 139 140 138 139 138  K 3.1*   < > 3.9   < > 3.8   < > 3.8 3.4* 4.2 3.3* 3.5  CL 98  --  102  --  104  --   --   --  104 102  --   CO2 22  --  16*  --   --   --   --   --  17* 20*  --   GLUCOSE 93  --  125*  --  117*  --   --   --  132* 147*  --   BUN 5*  --  11  --  11  --   --   --  13 12  --   CREATININE 0.79  --  1.23  --  1.50*  --   --   --  0.86 0.85  --   CALCIUM 8.9   < > 8.7*  --   --   --   --   --  8.2* 8.3*  --   MG  --   --   --   --   --   --   --   --   --  1.4*  --   PHOS  --   --   --   --   --   --   --   --   --  3.0  --    < > = values in this interval not displayed.    CBC: Recent Labs  Lab 10/29/19 2245 10/29/19 2245 11/01/19 1720 11/01/19 1755  11/01/19 1801 11/01/19 2118 11/01/19 2238 11/02/19 0036 11/02/19 0505  WBC 4.1  --  6.4  --   --   --  7.6 6.8  --   HGB 13.3   < > 13.5   < > 13.9 13.3 13.6 14.6 15.0  HCT 42.4   < > 43.6   < > 41.0 39.0 45.6 46.3 44.0  MCV 96.1  --  98.0  --   --   --  100.4* 97.5  --   PLT 127*  --  167  --   --   --  142* 146*  --    < > = values in  this interval not displayed.     Coagulation Studies: Recent Labs    11/01/19 1915 11/02/19 0036 11/02/19 0756  LABPROT 13.4 13.0 12.9  INR 1.1 1.0 1.0    Imaging CT head without contrast 11/01/2019: No acute abnormality  ASSESSMENT AND PLAN: 54 year old male with past medical history of alcohol use, HIV, hypertension who was found outside a convenience store.  EMS found him in PEA arrest with unknown downtime and required 2 rounds of CPR with ROSC.  He was noted to have myoclonic seizures on arrival and was started on Keppra and Versed.  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  If seizures continue, will add propofol.  Discussed this with patient's bedside nurse -Also increase Keppra to 1500 mg twice daily -Myoclonic seizures in first 24 hours after cardiac arrest in a patient with unknown downtime is concerning for significant neurologic injury and low chances of meaningful neurologic recovery.   -We will attempt to control myoclonic seizures but if seizures remain difficult to control, we will have to discuss goals of care with family -Plan to obtain MRI brain without contrast to look for anoxic/hypoxic brain injury at 72 hours  -Neuroprotective measures including normoglycemia, normothermia, normonatremia -Seizure precautions -Management of  rest of comorbidities per primary team  CRITICAL CARE Performed by: Charlsie Quest   Total critical care time: 35 minutes  Critical care time was exclusive of separately billable procedures and treating other patients.  Critical care was necessary to treat or prevent imminent or life-threatening deterioration.  Critical care was time spent personally by me on the following activities: development of treatment plan with patient and/or surrogate as well as nursing, discussions with consultants, evaluation of patient's response to treatment, examination of patient, obtaining history from patient or surrogate, ordering and performing treatments and interventions, ordering and review of laboratory studies, ordering and review of radiographic studies, pulse oximetry and re-evaluation of patient's condition.   Lindie Spruce Epilepsy [facility] For questions after 5pm please refer to AMION to reach the Neurologist on call

## 2019-11-02 NOTE — Progress Notes (Signed)
eLink Physician-Brief Progress Note Patient Name: Ryan Orozco DOB: 15-Feb-1965 MRN: [id number]   Date of Service  11/02/2019  HPI/Events of Note  Patient admitted through the ED with out of hospital cardiac arrest, coma, r/o seizures and hypertensive urgency.  eICU Interventions  New Patient Evaluation completed, Cardene infusion, Keppra load, continue Versed infusion,  Prn fentanyl, EEG, neurology consultation.        Milana Salay U Merilyn Pagan 11/02/2019, 1:13 AM

## 2019-11-02 NOTE — Procedures (Addendum)
Patient Name: Ryan Orozco  MRN: [id number]  Epilepsy Attending: Charlsie Quest  Referring Physician/Provider: Pricilla Holm, NP Date: 11/01/2019 Duration: 21.25mins  Patient history: 54 year old male status post cardiac arrest.  EEG to evaluate for seizures.  Level of alertness: Comatose  AEDs during EEG study: None  Technical aspects: This EEG study was done with scalp electrodes positioned according to the 10-20 International system of electrode placement. Electrical activity was acquired at a sampling rate of 500Hz  and reviewed with a high frequency filter of 70Hz  and a low frequency filter of 1Hz . EEG data were recorded continuously and digitally stored.   Description: Patient was noted to have frequent episodes of axial jerking with eye opening.  Concomitant EEG showed generalized polyspikes consistent with myoclonic seizures.  EEG also showed continuous generalized background suppression.  EEG was not reactive to tactile stimulation.  Hyperventilation and photic stimulation were not performed.     ABNORMALITY -Myoclonic seizures, generalized -Background suppression, generalized  IMPRESSION: This study showed evidence of frequent myoclonic seizures as well as profound diffuse encephalopathy likely suggestive of anoxic/hypoxic brain injury.  Ryan Orozco 

## 2019-11-02 NOTE — Progress Notes (Signed)
eLink Physician-Brief Progress Note Patient Name: Geovani Tootle DOB: 1965-09-14 MRN: [id number]   Date of Service  11/02/2019  HPI/Events of Note  K+ 3.1  eICU Interventions  Adult electrolyte replacement protocol for K+ ordered.        Thomasene Lot Zuleyka Kloc 11/02/2019, 10:23 PM

## 2019-11-02 NOTE — Progress Notes (Signed)
Initial Nutrition Assessment  DOCUMENTATION CODES:   Not applicable  INTERVENTION:   Tube feeding:  -Vital AF 1.2 @ 55 ml/hr via OG (1320 ml) -45 ml ProSource BID  Provides: 1664  Kcals, 121 grams protein, 1071 ml free water.   NUTRITION DIAGNOSIS:   Increased nutrient needs related to acute illness as evidenced by estimated needs.  GOAL:   Patient will meet greater than or equal to 90% of their needs  MONITOR:   Vent status, Skin, TF tolerance, Weight trends, Labs, I & O's  REASON FOR ASSESSMENT:   Ventilator, Consult Enteral/tube feeding initiation and management  ASSESSMENT:   Patient with PMH significant for HIV and ETOH use. Presents this admission with cardiac arrest and suspected aspiration in setting of heavy ETOH use.   Normothermia. No pressor/propofol. OG confirmed in stomach per xray. Okay to start feeding per CCM.   Weight history limited. Utilize 78.9 kg ad EDW for now.   Patient is currently intubated on ventilator support MV: 11.3 L/min Temp (24hrs), Avg:97 F (36.1 C), Min:95.2 F (35.1 C), Max:99.2 F (37.3 C)   UOP: 3325 ml x 24 hrs   Drips: reviewed  Medications: folic acid, thiamine Labs: CBG 118-140  Diet Order:   Diet Order            Diet NPO time specified  Diet effective now                 EDUCATION NEEDS:   Not appropriate for education at this time  Skin:  Skin Assessment: Reviewed RN Assessment  Last BM:  PTA  Height:   Ht Readings from Last 1 Encounters:  11/01/19 5\' 6"  (1.676 m)    Weight:   Wt Readings from Last 1 Encounters:  11/01/19 78.9 kg    BMI:  Body mass index is 28.08 kg/m.  Estimated Nutritional Needs:   Kcal:  1679 kcal  Protein:  115-130 grams  Fluid:  >/= 1.6 L/day   11/03/19 RD, LDN Clinical Nutrition Pager listed in AMION

## 2019-11-02 NOTE — Progress Notes (Signed)
eLink Physician-Brief Progress Note Patient Name: Ryan Orozco DOB: 1965-10-28 MRN: [id number]   Date of Service  11/02/2019  HPI/Events of Note  K+ 3.3, Mg+ 1.4  eICU Interventions  Electrolyte replacement ordered per adult electrolyte protocol for K+ and Mg+        [name] U Ariely Riddell 11/02/2019, 1:52 AM

## 2019-11-02 NOTE — Procedures (Signed)
Arterial Catheter Insertion Procedure Note  [facility]  [id number]  [birth year]  Date:11/02/19  Time:2:00 AM    Provider Performing: Leafy Half    Procedure: Insertion of Arterial Line (88416) without US guidance  Indication(s) Blood pressure monitoring and/or need for frequent ABGs  Consent Unable to obtain consent due to emergent nature of procedure.  Anesthesia None   Time Out Verified patient identification, verified procedure, site/side was marked, verified correct patient position, special equipment/implants available, medications/allergies/relevant history reviewed, required imaging and test results available.   Sterile Technique Maximal sterile technique including full sterile barrier drape, hand hygiene, sterile gown, sterile gloves, mask, hair covering, sterile ultrasound probe cover (if used).   Procedure Description Area of catheter insertion was cleaned with chlorhexidine and draped in sterile fashion. With real-time ultrasound guidance an arterial catheter was placed into the right radial artery.  Appropriate arterial tracings confirmed on monitor.     Complications/Tolerance None; patient tolerated the procedure well.   EBL Minimal   Specimen(s) None

## 2019-11-02 NOTE — Progress Notes (Signed)
Changed patient's temp goal setting on [place] to 37 per Dr. Denese Killings.

## 2019-11-02 NOTE — Procedures (Addendum)
Patient Name: Ryan Orozco  MRN: [id number]  Epilepsy Attending: Charlsie Quest  Referring Physician/Provider: Pricilla Holm, NP Duration: 11/01/2019 2225 to 11/02/2019 2225  Patient history: 54 year old male status post cardiac arrest.  EEG to evaluate for seizures.  Level of alertness: Comatose  AEDs during EEG study: Versed, LEV  Technical aspects: This EEG study was done with scalp electrodes positioned according to the 10-20 International system of electrode placement. Electrical activity was acquired at a sampling rate of 500Hz  and reviewed with a high frequency filter of 70Hz  and a low frequency filter of 1Hz . EEG data were recorded continuously and digitally stored.   Description: Patient was initially noted to have frequent episodes of axial jerking with eye opening.  Concomitant EEG showed generalized polyspikes consistent with myoclonic seizures.  EEG also showed continuous generalized background suppression.  Gradually as Versed was titrated up, frequency of myoclonic jerks started reducing and no more myoclonic jerks were seen after around 1500 on 11/02/2019. EEG done showed continuous generalized  low amplitude 3 to 6 Hz theta-delta slowing which was reactive to noxious stimulation.  Hyperventilation and photic stimulation were not performed.     ABNORMALITY -Myoclonic seizures, generalized -Background suppression, generalized -Continue slow, generalized  IMPRESSION: This study showed initially showed evidence of frequent myoclonic seizures.  As Versed was titrated up, after around 1500 on 11/02/2019, seizures improved and EEG was suggestive of severe diffuse encephalopathy, nonspecific etiology but likely related to anoxic/hypoxic brain injury, sedation.    Ryan Orozco 

## 2019-11-02 NOTE — Consult Note (Addendum)
NEUROLOGY CONSULTATION NOTE   Date of service: November 02, 2019 Patient Name: Ryan Orozco MRN:  [id number] DOB:  [birth year] Reason for consult: "Cardiac arrest with jerking"  History of Present Illness  Ryan Orozco is a 54 y.o. male with PMH significant for EtOH use, HIV, HTN who was found down outside a convenience store. EMS found him pulseless with PEA. Unknown downtime. 2 rounds of CPR with ROSC. Per notes, "They said that he woke up and started to move things spontaneously.". He was admitted to the ICU and undergoing TTM. He was noted to have full body rapid twitch lasting less than a second involving all extremities and a facial twitch and biting down tube with blood in mouth.  Neurology consulted for evaluation of these movement and to assist with their management. PCCM team gave patient Versed 3mg , started on continuous Versed and gave him Keppra 1500mg  once.  Workup in the ED with ABG with pH of 7.232, lactate of 4.4 on arrival, glucose of 125. He was given Vanc, Cefepime. CTH was obtained and was negative for any acute intracranial abnormality. LFT with mild transaminitis. Blood alcohol level of 177. COVID negative.   ROS   Unable to obtain 2/2 intubation and sedation.  Past History   Past Medical History:  Diagnosis Date  . Alcohol abuse   . HIV (human immunodeficiency virus infection) (HCC) 1995   Past Surgical History:  Procedure Laterality Date  . NO PAST SURGERIES     Family History  Problem Relation Age of Onset  . Hypertension Mother   . Throat cancer Mother   . Lung cancer Father   . Cancer Maternal Grandfather    Social History   Socioeconomic History  . Marital status: Single    Spouse name: Not on file  . Number of children: Not on file  . Years of education: Not on file  . Highest education level: Not on file  Occupational History  . Not on file  Tobacco Use  . Smoking status: Never Smoker  . Smokeless tobacco: Never Used  Substance  and Sexual Activity  . Alcohol use: Yes    Comment: occasionally  . Drug use: No  . Sexual activity: Yes    Partners: Female    Birth control/protection: Condom  Other Topics Concern  . Not on file  Social History Narrative  . Not on file   Social Determinants of Health   Financial Resource Strain:   . Difficulty of Paying Living Expenses: Not on file  Food Insecurity:   . Worried About in the Last Year: Not on file  . Ran Out of Food in the Last Year: Not on file  Transportation Needs:   . Lack of Transportation (Medical): Not on file  . Lack of Transportation (Non-Medical): Not on file  Physical Activity:   . Days of Exercise per Week: Not on file  . Minutes of Exercise per Session: Not on file  Stress:   . Feeling of Stress : Not on file  Social Connections:   . Frequency of Communication with Friends and Family: Not on file  . Frequency of Social Gatherings with Friends and Family: Not on file  . Attends Religious Services: Not on file  . Active Member of Clubs or Organizations: Not on file  . Attends Meetings: Not on file  . Marital Status: Not on file   Allergies  Allergen Reactions  . Tomato Hives    Medications  Medications Prior to Admission  Medication Sig Dispense Refill Last Dose  . bictegravir-emtricitabine-tenofovir AF (BIKTARVY) 50-200-25 MG TABS tablet Take 1 tablet by mouth daily. (Patient not taking: Reported on 11/01/2019) 30 tablet 5 Not Taking at Unknown time  . erythromycin ophthalmic ointment Place a 1/2 inch ribbon of ointment into the lower eyelid. (Patient not taking: Reported on 02/18/2018) 1 g 0 Not Taking at Unknown time  . HYDROcodone-acetaminophen (NORCO/VICODIN) 5-325 MG per tablet Take 1-2 tablets by mouth every 6 (six) hours as needed for pain. (Patient not taking: Reported on 02/18/2018) 17 tablet 0 Not Taking at Unknown time  . oxyCODONE-acetaminophen (PERCOCET/ROXICET) 5-325 MG tablet Take 1  tablet by mouth every 4 (four) hours as needed for severe pain. (Patient not taking: Reported on 02/18/2018) 10 tablet 0 Not Taking at Unknown time  . potassium chloride SA (KLOR-CON) 20 MEQ tablet Take 1 tablet (20 mEq total) by mouth daily. (Patient not taking: Reported on 11/01/2019) 3 tablet 0 Not Taking at Unknown time     Vitals  Temp:  [96.4 F (35.8 C)-99.2 F (37.3 C)] 96.6 F (35.9 C) (09/30 0300) Pulse Rate:  [60-119] 93 (09/30 0300) Resp:  [16-32] 19 (09/30 0300) BP: (99-209)/(60-135) 166/87 (09/30 0200) SpO2:  [88 %-100 %] 93 % (09/30 0300) Arterial Line BP: (169-209)/(80-97) 197/90 (09/30 0300) FiO2 (%):  [40 %-100 %] 40 % (09/29 2328) Weight:  [78.9 kg] 78.9 kg (09/29 1842)  Body mass index is 28.08 kg/m.  Physical Exam   General: Laying comfortably in bed; intubated.  HENT: Normal oropharynx and mucosa. Normal external appearance of ears and nose. Neck: Supple, no pain or tenderness CV: No JVD. No peripheral edema. Pulmonary: Symmetric Chest rise. Normal respiratory effort. Abdomen: Soft to touch, non-tender Ext: No cyanosis, edema, or deformity. Skin: No rash. Normal palpation of skin.   Musculoskeletal: Normal digits and nails by inspection. No clubbing.  Neurologic Examination on Versed and with core temperature of 96.6  Mental status/Cognition: Does not open eyes to voice or loud clap or to nares stimulation.  Brainstem reflexes: Dysconjugate gaze. Corneals + BL Pupils 63mm BL and round and reactive to light Unable to get to the oropharynx to elicit a gag. Cough absent. No movement to noxious stimuli in any extremities. Occulocephalics deferred.  Motor:  Muscle bulk: normal, tone difficult to assess due to twitch.  Noted to have brief(< 1sec) full body twitching and biting down on his tongue and the tube. The twitching is more frequent with stimulation but he did have episodes even at rest.  Labs   Lab Results  Component Value Date   NA 139  11/02/2019   K 3.3 (L) 11/02/2019   CL 102 11/02/2019   CO2 20 (L) 11/02/2019   GLUCOSE 147 (H) 11/02/2019   BUN 12 11/02/2019   CREATININE 0.85 11/02/2019   CALCIUM 8.3 (L) 11/02/2019   ALBUMIN 3.3 (L) 11/01/2019   AST 177 (H) 11/01/2019   ALT 63 (H) 11/01/2019   ALKPHOS 132 (H) 11/01/2019   BILITOT 1.0 11/01/2019   GFRNONAA >60 11/02/2019   GFRAA >60 11/02/2019     Imaging and Diagnostic studies  CT head: 1. No evidence of acute intracranial abnormality. 2. Moderate generalized cerebral atrophy, advanced for age. 3. Chronic medially displaced fracture deformity of the right lamina papyracea. 4. Mild ethmoid and maxillary sinus mucosal thickening.  CT cervical spine: 1. No evidence of acute fracture to the cervical spine. 2. Partially imaged airspace disease within the imaged lung apices (  extensive on the left). Clinical correlation is recommended. 3. 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   Impression   Ryan Orozco is a 54 y.o. male with PMH significant for EtOH use, HIV, HTN who was found down outside a convenience store. EMS found him pulseless with PEA. Unknown downtime. 2 rounds of CPR with ROSC. Now undergoing cooling. His neurologic examination on Versed and core Temp of 96.6 with absent cough, but reactive pupils and + corneals. The movement are concerning for myoclonic seizures.  Recommendations  - cEEG - I ordered an additional dose of KEppra 1500mg  once - I ordered Keppra 500mg  BID - Next steps: Versed 0.1mg  - 2 mg/Kg/Hr and may need to do burst suppression if he continues to have these. - Avoid hypovolemia, hyponatremia, hyperthermia, maintain Euvolemia. - I ordered Neuron specific enolase on day 1 and 3. - MRI Brain without contrast when able ______________________________________________________________________  This patient is critically ill and at significant risk of  neurological worsening, death and care requires constant monitoring of vital signs, hemodynamics,respiratory and cardiac monitoring, neurological assessment, discussion with family, other specialists and medical decision making of high complexity. I spent 40 minutes of neurocritical care time  in the care of  this patient. This was time spent independent of any time provided by nurse practitioner or PA.  [facility] Pager Number 11/02/2019  3:45 AM   Thank you for the opportunity to take part in the care of this patient. If you have any further questions, please contact the neurology consultation attending.  Signed,  [phone] [facility] Pager Number 11/04/2019

## 2019-11-02 NOTE — Progress Notes (Signed)
eLink Physician-Brief Progress Note Patient Name: Ryan Orozco DOB: April 05, 1965 MRN: [id number]   Date of Service  11/02/2019  HPI/Events of Note  Patient admitted to the ICU following out of hospital cardiac arrest, with successful resuscitation to ROSC, intubated in the field and currently mechanically ventilated, he is comatose and appears to be having intermittent posturing and seizures.  eICU Interventions  New Patient Evaluation completed, Neurology consultation will be requested.        [name] 11/02/2019, 12:56 AM

## 2019-11-02 NOTE — Progress Notes (Addendum)
eLink Physician-Brief Progress Note Patient Name: Ryan Orozco DOB: December 12, 1965 MRN: [id number]   Date of Service  11/02/2019  HPI/Events of Note  Patient with elevated blood pressure.  eICU Interventions  Labetalol 10-20 mg iv Q 2 hours PRN SBP  > 160, Cardene discontinued.        Ryan Orozco 11/02/2019, 1:59 AM

## 2019-11-03 DIAGNOSIS — I469 Cardiac arrest, cause unspecified: Secondary | ICD-10-CM

## 2019-11-03 LAB — BASIC METABOLIC PANEL WITH GFR
Anion gap: 8 (ref 5–15)
BUN: 10 mg/dL (ref 6–20)
CO2: 21 mmol/L — ABNORMAL LOW (ref 22–32)
Calcium: 8.1 mg/dL — ABNORMAL LOW (ref 8.9–10.3)
Chloride: 106 mmol/L (ref 98–111)
Creatinine, Ser: 0.68 mg/dL (ref 0.61–1.24)
GFR calc Af Amer: >60 mL/min
GFR calc non Af Amer: >60 mL/min
Glucose, Bld: 133 mg/dL — ABNORMAL HIGH (ref 70–99)
Potassium: 4.5 mmol/L (ref 3.5–5.1)
Sodium: 135 mmol/L (ref 135–145)

## 2019-11-03 LAB — CBC
HCT: 43.5 % (ref 39.0–52.0)
Hemoglobin: 13.8 g/dL (ref 13.0–17.0)
MCH: 29.8 pg (ref 26.0–34.0)
MCHC: 31.7 g/dL (ref 30.0–36.0)
MCV: 94 fL (ref 80.0–100.0)
Platelets: 139 K/uL — ABNORMAL LOW (ref 150–400)
RBC: 4.63 MIL/uL (ref 4.22–5.81)
RDW: 13.9 % (ref 11.5–15.5)
WBC: 6.2 K/uL (ref 4.0–10.5)
nRBC: 0 % (ref 0.0–0.2)

## 2019-11-03 LAB — GLUCOSE, CAPILLARY
Glucose-Capillary: 125 mg/dL — ABNORMAL HIGH (ref 70–99)
Glucose-Capillary: 116 mg/dL — ABNORMAL HIGH (ref 70–99)
Glucose-Capillary: 106 mg/dL — ABNORMAL HIGH (ref 70–99)
Glucose-Capillary: 125 mg/dL — ABNORMAL HIGH (ref 70–99)
Glucose-Capillary: 112 mg/dL — ABNORMAL HIGH (ref 70–99)
Glucose-Capillary: 115 mg/dL — ABNORMAL HIGH (ref 70–99)

## 2019-11-03 LAB — CALCIUM, IONIZED: Calcium, Ionized, Serum: 4.8 mg/dL (ref 4.5–5.6)

## 2019-11-03 MED ORDER — INFLUENZA VAC SPLIT QUAD 0.5 ML IM SUSY
0.5000 mL | PREFILLED_SYRINGE | INTRAMUSCULAR | Status: AC | PRN
  Administered 2020-02-11: 0.5 mL via INTRAMUSCULAR
  Filled 2019-11-03 (×2): qty 0.5

## 2019-11-03 MED ORDER — SODIUM CHLORIDE 0.9 % IV SOLN
2.0000 g | INTRAVENOUS | Status: AC
  Administered 2019-11-03 – 2019-11-05 (×3): 2 g via INTRAVENOUS
  Filled 2019-11-03 (×3): qty 20

## 2019-11-03 NOTE — Progress Notes (Addendum)
NAMEEdman Orozco, MRN:  [id number], DOB:  1965-06-20, LOS: 2 ADMISSION DATE:  11/01/2019, CONSULTATION DATE:  9/29 REFERRING MD:  EDP, CHIEF COMPLAINT:  Cardiac arrest    Brief History   54yo male with hx HIV (last CD4 count 191 on 10/12/19), ETOH with recent ER visit 9/27 after being picked up by police intoxicated. Returned 9/29 after being found down outside convenience store.  Pulseless on EMS arrival with PEA.  Unknown downtime prior to EMS arrival. EMS performed 2 rounds of CPR with 2 epi before ROSC.  Admitted to ICU.   History of present illness   54yo male with hx HIV, ETOH with recent ER visit 9/27 after being picked up by police intoxicated. Returned 9/29 after being found down outside convenience store.  Pulseless on EMS arrival with PEA.  Unknown downtime prior to EMS arrival. EMS performed 2 rounds of CPR with 2 epi before ROSC, intubated en route..  Admitted to ICU   In ER lactate 4.4 post CPR, troponin 450, electrolytes wnl, ETOH 177  Past Medical History   has a past medical history of Alcohol abuse and HIV (human immunodeficiency virus infection) (HCC) (1995).   Significant Hospital Events     Consults:  Neuro  Procedures:  ETT 9/29 >>  Significant Diagnostic Tests:  CT head 9/29>>> 1. No evidence of acute intracranial abnormality. 2. Moderate generalized cerebral atrophy, advanced for age. 3. Chronic medially displaced fracture deformity of the right lamina papyracea. 4. Mild ethmoid and maxillary sinus mucosal thickening.  CT cervical spine 9/29>>> 1. No evidence of acute fracture to the cervical spine. 2. Partially imaged airspace disease within the imaged lung apices (extensive on the left). Clinical correlation is recommended. 3. 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  EEG 9/29>>>Patient was noted to have frequent episodes of axial jerking with eye opening.  Concomitant EEG showed generalized polyspikes consistent with myoclonic seizures. EEG also showed continuous generalized background suppression. EEG was not reactive to tactile stimulation. Hyperventilation and photic stimulation were not performed  ECHO 9/30>>>No evidence of wall motion abnormality  Micro Data:  BC x 2 9/29>>>NG24hr Urine 9/29>>>UA few bacteria, WBC, RBC, CaOxalate, and Mucus Covid 9/29>>>negative  Antimicrobials:  Vancomycin 9/29>>>9/30 Cefepime 9/29>>>9/30  Interim history/subjective:  Heavily sedated, shivering some. Versed at 75mg /hr  Objective   Blood pressure 132/64, pulse 70, temperature 99 F (37.2 C), temperature source Bladder, resp. rate 20, height 5\' 6"  (1.676 m), weight 83.9 kg, SpO2 100 %.    Vent Mode: PRVC FiO2 (%):  [40 %] 40 % Set Rate:  [20 bmp] 20 bmp Vt Set:  [510 mL] 510 mL PEEP:  [5 cmH20] 5 cmH20 Plateau Pressure:  [15 cmH20-18 cmH20] 18 cmH20   Intake/Output Summary (Last 24 hours) at 11/03/2019 0946 Last data filed at 11/03/2019 0900 Gross per 24 hour  Intake 5980.56 ml  Output 2450 ml  Net 3530.56 ml   Filed Weights   11/01/19 1842 11/03/19 0500  Weight: 78.9 kg 83.9 kg    Examination: General: Ill- appearing male, unarousable HENT: MMM, ETT, Pinpoint pupils reactive to light, EOM not illicited Lungs: Coarse breath sounds L>R, mechanically ventilated Cardiovascular: S1S2, RRR, systolic murmur noted Abdomen: Soft, mildly distended, normoactive bowel sounds Extremities: warm and dry, no edema  GU: foley   Resolved Hospital Problem list     Assessment & Plan:   Myoclonic seizures and AMS A: Post arrest myoclonuic seizures, started on  Keppra and Versed. Neuro consulted   P: Per Neuro -Titrate  Versed up till 80 ml/hr / cessation of seizures.  If seizures continue, will add propofol.  -Keppra 1500 mg twice daily -Brain MRI - Maintain normoglycemia, normothermia, normonatremia -Evaluate neuro status after 48 hours  of normothermia treatment and sedation (10/2)  Cardiac arrest A: PEA on EMS arrival. No bystander CPR< unknown downtime.  2 rounds CPR/epi with EMS prior to ROSC. EKG normal sinus rhythm with peaked T waves, K 4.2, Echo normal without wall abnormality.  P: -Normothermia  -Trend troponin -Maintain normal BP/HR -Labetalol prn -Nicardipine   Acute hypoxic respiratory failure A: Resp failure requiring airway protection intubation due to post-arrest myoclonic seizures, LUL infiltrates noted on CT and CXR in setting of ETOH hx and Hx of HIV, patient afebrile, WBC WNL, 9/30 ABG 7.4, pCO2 38.7, HCO3 24.4  P:  -Mechanical ventilation and sedation for 48 hours due to myoclonic seizures and post arrest -Ceftriaxone >>10/3  Hx ETOH abuse  A: AST 177, ALT 63, PT/INR 12.9/1.0  , TBili 1.0  P: -Thiamine  -Folate  -Monitor for withdrawal  -Sedation as above   Hx HIV - reportedly not taking meds.   A: Low suspicious for infection at this time per afebrile and normal WBC but last CD4 was <200  P: -CD4  -Hold home meds for now  -f/u cultures  Nutrition/GI A: Intubated and sedated  P: - TF - Bowel regimen: Miralax daily, suppository if no BM today  Best practice:  Diet: TF Pain/Anxiety/Delirium protocol (if indicated): PAD protocol  VAP protocol (if indicated): ordered DVT prophylaxis: SQheparin  GI prophylaxis: ordered Glucose control: n/a Mobility: BR Code Status: full Family Communication: sister and brother in law Disposition:  ICU  Labs   CBC: Recent Labs  Lab 10/29/19 2245 10/29/19 2245 11/01/19 1720 11/01/19 1755 11/01/19 2118 11/01/19 2238 11/02/19 0036 11/02/19 0505 11/03/19 0311  WBC 4.1  --  6.4  --   --  7.6 6.8  --  6.2  HGB 13.3   < > 13.5   < > 13.3 13.6 14.6 15.0 13.8  HCT 42.4   < > 43.6   < > 39.0 45.6 46.3 44.0 43.5  MCV 96.1  --  98.0  --   --  100.4* 97.5  --  94.0  PLT 127*  --  167  --   --  142* 146*  --  139*   < > = values in this  interval not displayed.    Basic Metabolic Panel: Recent Labs  Lab 11/01/19 2238 11/01/19 2238 11/02/19 0036 11/02/19 0505 11/02/19 0951 11/02/19 2050 11/03/19 0311  NA 138   < > 139 138 136 135 135  K 4.2   < > 3.3* 3.5 4.2 3.1* 4.5  CL 104  --  102  --  101 102 106  CO2 17*  --  20*  --  23 21* 21*  GLUCOSE 132*  --  147*  --  144* 149* 133*  BUN 13  --  12  --  10 10 10   CREATININE 0.86  --  0.85  --  0.67 0.68 0.68  CALCIUM 8.2*  --  8.3*  --  8.3* 8.1* 8.1*  MG  --   --  1.4*  --   --   --   --   PHOS  --   --  3.0  --   --   --   --    < > =  values in this interval not displayed.   GFR: Estimated Creatinine Clearance: 107.2 mL/min (by C-G formula based on SCr of 0.68 mg/dL). Recent Labs  Lab 11/01/19 1720 11/01/19 1915 11/01/19 2238 11/02/19 0036 11/03/19 0311  PROCALCITON  --   --  1.04  --   --   WBC 6.4  --  7.6 6.8 6.2  LATICACIDVEN  --  4.4* 3.2* 2.4*  --     Liver Function Tests: Recent Labs  Lab 10/29/19 2245 11/01/19 1915  AST 81* 177*  ALT 66* 63*  ALKPHOS 103 132*  BILITOT 0.8 1.0  PROT 7.9 7.1  ALBUMIN 3.8 3.3*   Recent Labs  Lab 11/01/19 1915  LIPASE 48   No results for input(s): AMMONIA in the last 168 hours.  ABG    Component Value Date/Time   PHART 7.408 11/02/2019 0505   PCO2ART 38.7 11/02/2019 0505   PO2ART 103 11/02/2019 0505   HCO3 24.4 11/02/2019 0505   TCO2 26 11/02/2019 0505   ACIDBASEDEF 7.0 (H) 11/01/2019 2118   O2SAT 98.0 11/02/2019 0505     Coagulation Profile: Recent Labs  Lab 11/01/19 1915 11/02/19 0036 11/02/19 0756  INR 1.1 1.0 1.0    Cardiac Enzymes: No results for input(s): CKTOTAL, CKMB, CKMBINDEX, TROPONINI in the last 168 hours.  HbA1C: No results found for: HGBA1C  CBG: Recent Labs  Lab 11/02/19 1509 11/02/19 1907 11/02/19 2307 11/03/19 0313 11/03/19 0706  GLUCAP 117* 117* 137* 125* 116*    Review of Systems:   Unable, pt sedated on vent post arrest. As per HPI obtained from  records and staff  Past Medical History  He,  has a past medical history of Alcohol abuse and HIV (human immunodeficiency virus infection) (HCC) (1995).   Surgical History    Past Surgical History:  Procedure Laterality Date  . NO PAST SURGERIES       Social History   reports that he has never smoked. He has never used smokeless tobacco. He reports current alcohol use. He reports that he does not use drugs.   Family History   His family history includes Cancer in his maternal grandfather; Hypertension in his mother; Lung cancer in his father; Throat cancer in his mother.   Allergies Allergies  Allergen Reactions  . Tomato Hives     Home Medications  Prior to Admission medications   Medication Sig Start Date End Date Taking? Authorizing Provider  bictegravir-emtricitabine-tenofovir AF (BIKTARVY) 50-200-25 MG TABS tablet Take 1 tablet by mouth daily. 02/18/18   Blanchard Kelchixon, Stephanie N, NP  erythromycin ophthalmic ointment Place a 1/2 inch ribbon of ointment into the lower eyelid. Patient not taking: Reported on 02/18/2018 04/15/17   Hedges, Tinnie GensJeffrey, PA-C  HYDROcodone-acetaminophen (NORCO/VICODIN) 5-325 MG per tablet Take 1-2 tablets by mouth every 6 (six) hours as needed for pain. Patient not taking: Reported on 02/18/2018 06/21/12   Jones SkeneBonk, John-Adam, MD  oxyCODONE-acetaminophen (PERCOCET/ROXICET) 5-325 MG tablet Take 1 tablet by mouth every 4 (four) hours as needed for severe pain. Patient not taking: Reported on 02/18/2018 04/15/17   Hedges, Tinnie GensJeffrey, PA-C  potassium chloride SA (KLOR-CON) 20 MEQ tablet Take 1 tablet (20 mEq total) by mouth daily. 10/30/19   Pricilla LovelessGoldston, Scott, MD    Olene CravenPatrick Taylor, MS4  -----------------------------------------------------------------------------------------------------------  Attending Attestation  Subjective: No acute events overnight  Objective: Vitals:   11/03/19 0900 11/03/19 1000 11/03/19 1100 11/03/19 1200  BP:      Pulse: 70 76 84 73  Resp:  20 20 15  20  Temp: 99 F (37.2 C) 98.8 F (37.1 C) 99 F (37.2 C) 99.1 F (37.3 C)  TempSrc: Bladder Bladder Bladder Bladder  SpO2: 100% 100% 100% 100%  Weight:      Height:       Vent Mode: PRVC FiO2 (%):  [40 %] 40 % Set Rate:  [20 bmp] 20 bmp Vt Set:  [510 mL] 510 mL PEEP:  [5 cmH20] 5 cmH20 Plateau Pressure:  [15 cmH20-18 cmH20] 18 cmH20  Intake/Output Summary (Last 24 hours) at 11/03/2019 1322 Last data filed at 11/03/2019 1200 Gross per 24 hour  Intake 6166.42 ml  Output 2110 ml  Net 4056.42 ml    General: intubated, sedated HEENT: sclera anicteric Neuro: withdraws to pain, PERRL CV: RRR, s1s2 PULM: course breath sounds. No wheezing or rhonchi GI: soft, non-tender, bowel sounds present Extremities: warm, dry. No edema Skin: intact GU: Foley in place   CBC    Component Value Date/Time   WBC 6.2 11/03/2019 0311   RBC 4.63 11/03/2019 0311   HGB 13.8 11/03/2019 0311   HCT 43.5 11/03/2019 0311   PLT 139 (L) 11/03/2019 0311   MCV 94.0 11/03/2019 0311   MCH 29.8 11/03/2019 0311   MCHC 31.7 11/03/2019 0311   RDW 13.9 11/03/2019 0311   LYMPHSABS 583 (L) 10/12/2019 1100   MONOABS 0.3 06/21/2012 0449   EOSABS 60 10/12/2019 1100   BASOSABS 41 10/12/2019 1100    BMET    Component Value Date/Time   NA 135 11/03/2019 0311   K 4.5 11/03/2019 0311   CL 106 11/03/2019 0311   CO2 21 (L) 11/03/2019 0311   GLUCOSE 133 (H) 11/03/2019 0311   BUN 10 11/03/2019 0311   CREATININE 0.68 11/03/2019 0311   CREATININE 0.72 10/12/2019 1100   CALCIUM 8.1 (L) 11/03/2019 0311   GFRNONAA >60 11/03/2019 0311   GFRNONAA 106 10/12/2019 1100   GFRAA >60 11/03/2019 0311   GFRAA 123 10/12/2019 1100    CXR images 11/02/19 reviewed, persistent left upper lobe infiltrate  Impression/Plan: I agree with the impression and plan as outlined by Olene Craven, MS4 with emphasis on the following:  Ryan Orozco is a 54 year old male with HIV and ETOH abuse who is critically ill  secondary to PEA cardiac arrest suffering 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 Per neurology, epileptiform activity appears to have resolved. He will remain on versed today and begin tapering over the weekend. He remains on normothermic protocol until 10/2. He has been on broad spectrum antibiotics which will be tapered to ceftriaxone alone to complete a total of 5 days of antibiotics for pneumonia coverage.   My cc time 35 minutes  Melody Comas, MD [facility] Office: [phone]   See Amion for Pager Details

## 2019-11-03 NOTE — Progress Notes (Signed)
Subjective: Seizures improved after increasing Versed yesterday.  ROS: Unable to obtain due to poor mental status  Examination  Vital signs in last 24 hours: Temp:  [96.8 F (36 C)-99 F (37.2 C)] 98.8 F (37.1 C) (10/01 1000) Pulse Rate:  [65-97] 76 (10/01 1000) Resp:  [13-21] 20 (10/01 1000) BP: (132-144)/(64-72) 132/64 (10/01 0735) SpO2:  [100 %] 100 % (10/01 1000) Arterial Line BP: (97-154)/(45-79) 131/68 (10/01 1000) FiO2 (%):  [40 %] 40 % (10/01 0735) Weight:  [83.9 kg] 83.9 kg (10/01 0500)  General: Laying in bed, not in apparent distress  CVS: pulse-normal rate and rhythm RS: breathing comfortably, intubated Extremities: normal, warm Neuro: On Versed 75 mL/hr, comatose, PERRLA, corneal reflex intact, cough reflex intact, does not withdraw to noxious stimuli in bilateral upper extremities, 1/5 in bilateral lower extremities with noxious stimuli  Basic Metabolic Panel: Recent Labs  Lab 11/01/19 2238 11/01/19 2238 11/02/19 0036 11/02/19 0036 11/02/19 0505 11/02/19 0951 11/02/19 2050 11/03/19 0311  NA 138   < > 139  --  138 136 135 135  K 4.2   < > 3.3*  --  3.5 4.2 3.1* 4.5  CL 104  --  102  --   --  101 102 106  CO2 17*  --  20*  --   --  23 21* 21*  GLUCOSE 132*  --  147*  --   --  144* 149* 133*  BUN 13  --  12  --   --  10 10 10   CREATININE 0.86  --  0.85  --   --  0.67 0.68 0.68  CALCIUM 8.2*   < > 8.3*   < >  --  8.3* 8.1* 8.1*  MG  --   --  1.4*  --   --   --   --   --   PHOS  --   --  3.0  --   --   --   --   --    < > = values in this interval not displayed.    CBC: Recent Labs  Lab 10/29/19 2245 10/29/19 2245 11/01/19 1720 11/01/19 1755 11/01/19 2118 11/01/19 2238 11/02/19 0036 11/02/19 0505 11/03/19 0311  WBC 4.1  --  6.4  --   --  7.6 6.8  --  6.2  HGB 13.3   < > 13.5   < > 13.3 13.6 14.6 15.0 13.8  HCT 42.4   < > 43.6   < > 39.0 45.6 46.3 44.0 43.5  MCV 96.1  --  98.0  --   --  100.4* 97.5  --  94.0  PLT 127*  --  167  --   --  142*  146*  --  139*   < > = values in this interval not displayed.     Coagulation Studies: Recent Labs    11/01/19 1915 11/02/19 0036 11/02/19 0756  LABPROT 13.4 13.0 12.9  INR 1.1 1.0 1.0    Imaging No new brain imaging overnight   ASSESSMENT AND PLAN: 54 year old male with past medical history of alcohol use, HIV, hypertension who was found outside a convenience store.  EMS found him in PEA arrest with unknown downtime and required 2 rounds of CPR with ROSC.  He was noted to have myoclonic seizures on arrival and was started on Keppra and Versed.  Cardiac arrest Suspected anoxic/hypoxic brain injury Myoclonic status epilepticus (resolved)  -No further seizures overnight after Versed.  Clinical exam also  appears to be improved with cranial nerves intact as well as withdrawal in bilateral lower extremities today.  Recommendation -Continue Versed at 75 ml/hr for 24 hours with plan to start weaning tomorrow. -If seizures do not recur, will stop Versed tomorrow.  However, if seizures recur, will resume Versed and add valproic acid. -Continue LTM EEG while patient is on sedation -Plan to obtain MRI brain without contrast to look for anoxic/hypoxic brain injury on Sunday -Continue Keppra to 1500 mg twice daily -Myoclonic seizures in first 24 hours after cardiac arrest in a patient with unknown downtime is concerning for significant neurologic injury and low chances of meaningful neurologic recovery.  However, now that seizures have improved, we will continue with daily neurologic exams for prognostication. -Neuroprotective measures including normoglycemia, normothermia, normonatremia -Seizure precautions -Management of rest of comorbidities per primary team  CRITICAL CARE Performed by: Charlsie Quest   Total critical care time: 35 minutes  Critical care time was exclusive of separately billable procedures and treating other patients.  Critical care was necessary to treat  or prevent imminent or life-threatening deterioration.  Critical care was time spent personally by me on the following activities: development of treatment plan with patient and/or surrogate as well as nursing, discussions with consultants, evaluation of patient's response to treatment, examination of patient, obtaining history from patient or surrogate, ordering and performing treatments and interventions, ordering and review of laboratory studies, ordering and review of radiographic studies, pulse oximetry and re-evaluation of patient's condition.   Lindie Spruce Epilepsy [facility] For questions after 5pm please refer to AMION to reach the Neurologist on call

## 2019-11-03 NOTE — Progress Notes (Signed)
LTM maintenance completed; checked under A2, Fp1, and Fp2; moved Fp1 and Fp2 1 cm up and to the right due to indentation and slight irritation.

## 2019-11-03 NOTE — Procedures (Addendum)
Patient Name:Ryan Orozco KYH:[id number] Epilepsy Attending:Dhruvan Gullion Annabelle Harman Referring Physician/Provider:Kathyrn Whiteheart, NP Duration:11/02/2019 2225 to 11/03/2019 2225  Patient history:54 year old male status post cardiac arrest. EEG to evaluate for seizures.  Level of alertness:Comatose  AEDs during EEG study: Versed, LEV  Technical aspects: This EEG study was done with scalp electrodes positioned according to the 10-20 International system of electrode placement. Electrical activity was acquired at a sampling rate of 500Hz  and reviewed with a high frequency filter of 70Hz  and a low frequency filter of 1Hz . EEG data were recorded continuously and digitally stored.   Description:EEG showed continuous generalized low amplitude 6 to 9 Hz theta-alpha activity as well as intermittent generalized 2 to 3 Hz delta slowing.  EEG reactive to noxious stimulation.  Hyperventilation and photic stimulation were not performed.   ABNORMALITY -Continuous slow, generalized  IMPRESSION: This studyis suggestive of severe diffuse encephalopathy, nonspecific etiology but likely related to anoxic/hypoxic brain injury, sedation.  No seizures and epileptiform discharges were seen during this study.   [name] 

## 2019-11-04 LAB — CBC
HCT: 41.3 % (ref 39.0–52.0)
Hemoglobin: 13.2 g/dL (ref 13.0–17.0)
MCH: 30.5 pg (ref 26.0–34.0)
MCHC: 32 g/dL (ref 30.0–36.0)
MCV: 95.4 fL (ref 80.0–100.0)
Platelets: 147 10*3/uL — ABNORMAL LOW (ref 150–400)
RBC: 4.33 MIL/uL (ref 4.22–5.81)
RDW: 13.9 % (ref 11.5–15.5)
WBC: 4.3 10*3/uL (ref 4.0–10.5)
nRBC: 0 % (ref 0.0–0.2)

## 2019-11-04 LAB — GLUCOSE, CAPILLARY
Glucose-Capillary: 102 mg/dL — ABNORMAL HIGH (ref 70–99)
Glucose-Capillary: 119 mg/dL — ABNORMAL HIGH (ref 70–99)
Glucose-Capillary: 96 mg/dL (ref 70–99)
Glucose-Capillary: 99 mg/dL (ref 70–99)
Glucose-Capillary: 99 mg/dL (ref 70–99)
Glucose-Capillary: 99 mg/dL (ref 70–99)

## 2019-11-04 LAB — BASIC METABOLIC PANEL
Anion gap: 8 (ref 5–15)
BUN: 7 mg/dL (ref 6–20)
CO2: 23 mmol/L (ref 22–32)
Calcium: 8.3 mg/dL — ABNORMAL LOW (ref 8.9–10.3)
Chloride: 104 mmol/L (ref 98–111)
Creatinine, Ser: 0.57 mg/dL — ABNORMAL LOW (ref 0.61–1.24)
GFR calc Af Amer: >60 mL/min (ref >60)
GFR calc non Af Amer: >60 mL/min (ref >60)
Glucose, Bld: 116 mg/dL — ABNORMAL HIGH (ref 70–99)
Potassium: 3.9 mmol/L (ref 3.5–5.1)
Sodium: 135 mmol/L (ref 135–145)

## 2019-11-04 NOTE — Progress Notes (Signed)
NAMESavan Orozco, MRN:  [id number], DOB:  [birth year], LOS: 3 ADMISSION DATE:  11/01/2019, CONSULTATION DATE:  9/29 REFERRING MD:  EDP, CHIEF COMPLAINT:  Cardiac arrest    Brief History   54yo male with hx HIV (last CD4 count 191 on 10/12/19), ETOH with recent ER visit 9/27 after being picked up by police intoxicated. Returned 9/29 after being found down outside convenience store.  Pulseless on EMS arrival with PEA.  Unknown downtime prior to EMS arrival. EMS performed 2 rounds of CPR with 2 epi before ROSC.  Admitted to ICU.   History of present illness   54yo male with hx HIV, ETOH with recent ER visit 9/27 after being picked up by police intoxicated. Returned 9/29 after being found down outside convenience store.  Pulseless on EMS arrival with PEA.  Unknown downtime prior to EMS arrival. EMS performed 2 rounds of CPR with 2 epi before ROSC, intubated en route..  Admitted to ICU   In ER lactate 4.4 post CPR, troponin 450, electrolytes wnl, ETOH 177  Past Medical History   has a past medical history of Alcohol abuse and HIV (human immunodeficiency virus infection) (HCC) (1995).   Significant Hospital Events     Consults:  Neuro  Procedures:  ETT 9/29 >>  Significant Diagnostic Tests:  CT head 9/29>>> 1. No evidence of acute intracranial abnormality. 2. Moderate generalized cerebral atrophy, advanced for age. 3. Chronic medially displaced fracture deformity of the right lamina papyracea. 4. Mild ethmoid and maxillary sinus mucosal thickening.  CT cervical spine 9/29>>> 1. No evidence of acute fracture to the cervical spine. 2. Partially imaged airspace disease within the imaged lung apices (extensive on the left). Clinical correlation is recommended. 3. 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  EEG 9/29>>>Patient was noted to have frequent episodes of axial jerking with eye opening.  Concomitant EEG showed generalized polyspikes consistent with myoclonic seizures. EEG also showed continuous generalized background suppression. EEG was not reactive to tactile stimulation. Hyperventilation and photic stimulation were not performed  ECHO 9/30>>>No evidence of wall motion abnormality  Micro Data:  BC x 2 9/29>>>NG24hr Urine 9/29>>>UA few bacteria, WBC, RBC, CaOxalate, and Mucus Covid 9/29>>>negative  Antimicrobials:  Vancomycin 9/29>>>9/30 Cefepime 9/29>>>9/30  Interim history/subjective:  No acute events overnight.  Objective   Blood pressure 139/64, pulse 85, temperature 98.4 F (36.9 C), temperature source Bladder, resp. rate 14, height 5\' 6"  (1.676 m), weight 85 kg, SpO2 100 %.    Vent Mode: PRVC FiO2 (%):  [30 %-40 %] 30 % Set Rate:  [20 bmp] 20 bmp Vt Set:  [510 mL] 510 mL PEEP:  [5 cmH20] 5 cmH20 Plateau Pressure:  [18 cmH20-21 cmH20] 21 cmH20   Intake/Output Summary (Last 24 hours) at 11/04/2019 1220 Last data filed at 11/04/2019 1200 Gross per 24 hour  Intake 5106.44 ml  Output 3435 ml  Net 1671.44 ml   Filed Weights   11/01/19 1842 11/03/19 0500 11/04/19 0350  Weight: 78.9 kg 83.9 kg 85 kg    Examination: General: intubated, sedated HENT: MMM, ETT, Pinpoint pupils reactive to light, EOM not illicited Lungs: Coarse breath sounds Cardiovascular: S1S2, RRR Abdomen: Soft, normoactive bowel sounds Extremities: warm and dry, no edema  GU: foley   Resolved Hospital Problem list     Assessment & Plan:   Myoclonic seizures and AMS A: Post arrest myoclonuic seizures, started on Keppra and Versed. Neuro consulted   P:  Per Neuro -Continue versed at 75 ml/hr and will await further recs from neuro to discontinue.  -Keppra 1500 mg twice daily - MRI tomorrow to evaluate anoxic brain injury - Maintain normoglycemia, normothermia, normonatremia -Evaluate neuro status after 48 hours of normothermia treatment and sedation (10/2)  Cardiac  arrest A: PEA on EMS arrival. No bystander CPR< unknown downtime.  2 rounds CPR/epi with EMS prior to ROSC. EKG normal sinus rhythm with peaked T waves, K 4.2, Echo normal without wall abnormality.  P: -Normothermia  -Trend troponin -Maintain normal BP/HR -Labetalol prn -Nicardipine   Acute hypoxic respiratory failure A: Resp failure requiring airway protection intubation due to post-arrest myoclonic seizures, LUL infiltrates noted on CT and CXR in setting of ETOH hx and Hx of HIV, patient afebrile, WBC WNL, 9/30 ABG 7.4, pCO2 38.7, HCO3 24.4  P:  -Mechanical ventilation and sedation for 48 hours due to myoclonic seizures and post arrest -Ceftriaxone >>10/3  Hx ETOH abuse  A: AST 177, ALT 63, PT/INR 12.9/1.0  , TBili 1.0  P: -Thiamine  -Folate  -Monitor for withdrawal  -Sedation as above   Hx HIV - reportedly not taking meds.   A: Low suspicious for infection at this time per afebrile and normal WBC but last CD4 was <200  P: -CD4  -Hold home meds for now  -f/u cultures  Nutrition/GI A: Intubated and sedated  P: - TF - Bowel regimen: Miralax daily, suppository if no BM today  Best practice:  Diet: TF Pain/Anxiety/Delirium protocol (if indicated): PAD protocol  VAP protocol (if indicated): ordered DVT prophylaxis: SQheparin  GI prophylaxis: ordered Glucose control: n/a Mobility: BR Code Status: full Family Communication: will update family today Disposition:  ICU  Labs   CBC: Recent Labs  Lab 11/01/19 1720 11/01/19 1755 11/01/19 2238 11/02/19 0036 11/02/19 0505 11/03/19 0311 11/04/19 0403  WBC 6.4  --  7.6 6.8  --  6.2 4.3  HGB 13.5   < > 13.6 14.6 15.0 13.8 13.2  HCT 43.6   < > 45.6 46.3 44.0 43.5 41.3  MCV 98.0  --  100.4* 97.5  --  94.0 95.4  PLT 167  --  142* 146*  --  139* 147*   < > = values in this interval not displayed.    Basic Metabolic Panel: Recent Labs  Lab 11/02/19 0036 11/02/19 0036 11/02/19 0505 11/02/19 3149 11/02/19 2050  11/03/19 0311 11/04/19 0403  NA 139   < > 138 136 135 135 135  K 3.3*   < > 3.5 4.2 3.1* 4.5 3.9  CL 102  --   --  101 102 106 104  CO2 20*  --   --  23 21* 21* 23  GLUCOSE 147*  --   --  144* 149* 133* 116*  BUN 12  --   --  10 10 10 7   CREATININE 0.85  --   --  0.67 0.68 0.68 0.57*  CALCIUM 8.3*  --   --  8.3* 8.1* 8.1* 8.3*  MG 1.4*  --   --   --   --   --   --   PHOS 3.0  --   --   --   --   --   --    < > = values in this interval not displayed.   GFR: Estimated Creatinine Clearance: 107.9 mL/min (A) (by C-G formula based on SCr of 0.57 mg/dL (L)). Recent Labs  Lab 11/01/19 1720 11/01/19 1915 11/01/19 2238 11/02/19  0036 11/03/19 0311 11/04/19 0403  PROCALCITON  --   --  1.04  --   --   --   WBC   < >  --  7.6 6.8 6.2 4.3  LATICACIDVEN  --  4.4* 3.2* 2.4*  --   --    < > = values in this interval not displayed.    Liver Function Tests: Recent Labs  Lab 10/29/19 2245 11/01/19 1915  AST 81* 177*  ALT 66* 63*  ALKPHOS 103 132*  BILITOT 0.8 1.0  PROT 7.9 7.1  ALBUMIN 3.8 3.3*   Recent Labs  Lab 11/01/19 1915  LIPASE 48   No results for input(s): AMMONIA in the last 168 hours.  ABG    Component Value Date/Time   PHART 7.408 11/02/2019 0505   PCO2ART 38.7 11/02/2019 0505   PO2ART 103 11/02/2019 0505   HCO3 24.4 11/02/2019 0505   TCO2 26 11/02/2019 0505   ACIDBASEDEF 7.0 (H) 11/01/2019 2118   O2SAT 98.0 11/02/2019 0505     Coagulation Profile: Recent Labs  Lab 11/01/19 1915 11/02/19 0036 11/02/19 0756  INR 1.1 1.0 1.0    Cardiac Enzymes: No results for input(s): CKTOTAL, CKMB, CKMBINDEX, TROPONINI in the last 168 hours.  HbA1C: No results found for: HGBA1C  CBG: Recent Labs  Lab 11/03/19 1913 11/03/19 2317 11/04/19 0313 11/04/19 0718 11/04/19 1113  GLUCAP 112* 115* 99 102* 99   CC time: 35 minutes  Melody Comas, MD [facility] Office: [phone]   See Amion for Pager Details

## 2019-11-04 NOTE — Progress Notes (Signed)
eLink Physician-Brief Progress Note Patient Name: Ryan Orozco DOB: 16-Jul-1965 MRN: [id number]   Date of Service  11/04/2019  HPI/Events of Note  Recurrent episodes of loose stools. FMS requested.   eICU Interventions  FMS ordered.     Intervention Category Intermediate Interventions: Other:  Janae Bridgeman 11/04/2019, 1:55 AM

## 2019-11-04 NOTE — Progress Notes (Signed)
LTM maint complete - no skin breakdown under: FP1 F3 F7 A2

## 2019-11-04 NOTE — Procedures (Signed)
Patient Name:Ryan Orozco TZG:[id number] Epilepsy Attending:Dachelle Molzahn Annabelle Harman Referring Physician/Provider:Kathyrn Whiteheart, NP Duration:11/03/2019 2225 to 10/2/20212225  Patient history:54 year old male status post cardiac arrest. EEG to evaluate for seizures.  Level of alertness:Comatose  AEDs during EEG study:Versed, LEV  Technical aspects: This EEG study was done with scalp electrodes positioned according to the 10-20 International system of electrode placement. Electrical activity was acquired at a sampling rate of 500Hz  and reviewed with a high frequency filter of 70Hz  and a low frequency filter of 1Hz . EEG data were recorded continuously and digitally stored.   Description:EEG showed continuous generalizedlow amplitude 6 to 9 Hz theta-alpha activity as well as intermittent generalized 2 to 3 Hz delta slowing.  EEG reactive to noxious stimulation. Hyperventilation and photic stimulation were not performed.   ABNORMALITY -Continuous slow, generalized  IMPRESSION: This studyis suggestive of severe diffuse encephalopathy, nonspecific etiology but likely related to anoxic/hypoxic brain injury, sedation.No seizures and epileptiform discharges were seen during this study.   Aquarius Tremper 

## 2019-11-05 ENCOUNTER — Inpatient Hospital Stay (HOSPITAL_COMMUNITY): Payer: Medicaid Other

## 2019-11-05 LAB — CBC
HCT: 40.2 % (ref 39.0–52.0)
Hemoglobin: 12.7 g/dL — ABNORMAL LOW (ref 13.0–17.0)
MCH: 29.7 pg (ref 26.0–34.0)
MCHC: 31.6 g/dL (ref 30.0–36.0)
MCV: 93.9 fL (ref 80.0–100.0)
Platelets: 161 10*3/uL (ref 150–400)
RBC: 4.28 MIL/uL (ref 4.22–5.81)
RDW: 13.9 % (ref 11.5–15.5)
WBC: 3.9 10*3/uL — ABNORMAL LOW (ref 4.0–10.5)
nRBC: 0 % (ref 0.0–0.2)

## 2019-11-05 LAB — GLUCOSE, CAPILLARY
Glucose-Capillary: 101 mg/dL — ABNORMAL HIGH (ref 70–99)
Glucose-Capillary: 102 mg/dL — ABNORMAL HIGH (ref 70–99)
Glucose-Capillary: 105 mg/dL — ABNORMAL HIGH (ref 70–99)
Glucose-Capillary: 88 mg/dL (ref 70–99)
Glucose-Capillary: 92 mg/dL (ref 70–99)
Glucose-Capillary: 99 mg/dL (ref 70–99)

## 2019-11-05 LAB — BASIC METABOLIC PANEL
Anion gap: 10 (ref 5–15)
BUN: 8 mg/dL (ref 6–20)
CO2: 22 mmol/L (ref 22–32)
Calcium: 8.3 mg/dL — ABNORMAL LOW (ref 8.9–10.3)
Chloride: 104 mmol/L (ref 98–111)
Creatinine, Ser: 0.56 mg/dL — ABNORMAL LOW (ref 0.61–1.24)
GFR calc Af Amer: >60 mL/min (ref >60)
GFR calc non Af Amer: >60 mL/min (ref >60)
Glucose, Bld: 105 mg/dL — ABNORMAL HIGH (ref 70–99)
Potassium: 3.6 mmol/L (ref 3.5–5.1)
Sodium: 136 mmol/L (ref 135–145)

## 2019-11-05 MED ORDER — GADOBUTROL 1 MMOL/ML IV SOLN
9.0000 mL | Freq: Once | INTRAVENOUS | Status: AC | PRN
  Administered 2019-11-05: 9 mL via INTRAVENOUS

## 2019-11-05 MED ORDER — POTASSIUM CHLORIDE 20 MEQ/15ML (10%) PO SOLN
40.0000 meq | Freq: Once | ORAL | Status: AC
  Administered 2019-11-05: 40 meq
  Filled 2019-11-05: qty 30

## 2019-11-05 NOTE — Progress Notes (Signed)
NAMESohaib Orozco, MRN:  [id number], DOB:  May 16, 1965, LOS: 4 ADMISSION DATE:  11/01/2019, CONSULTATION DATE:  9/29 REFERRING MD:  EDP, CHIEF COMPLAINT:  Cardiac arrest    Brief History   54yo male with hx HIV (last CD4 count 191 on 10/12/19), ETOH with recent ER visit 9/27 after being picked up by police intoxicated. Returned 9/29 after being found down outside convenience store.  Pulseless on EMS arrival with PEA.  Unknown downtime prior to EMS arrival. EMS performed 2 rounds of CPR with 2 epi before ROSC.  Admitted to ICU.   History of present illness   54yo male with hx HIV, ETOH with recent ER visit 9/27 after being picked up by police intoxicated. Returned 9/29 after being found down outside convenience store.  Pulseless on EMS arrival with PEA.  Unknown downtime prior to EMS arrival. EMS performed 2 rounds of CPR with 2 epi before ROSC, intubated en route..  Admitted to ICU   In ER lactate 4.4 post CPR, troponin 450, electrolytes wnl, ETOH 177  Past Medical History   has a past medical history of Alcohol abuse and HIV (human immunodeficiency virus infection) (HCC) (1995).   Significant Hospital Events     Consults:  Neuro  Procedures:  ETT 9/29 >>  Significant Diagnostic Tests:  CT head 9/29>>> 1. No evidence of acute intracranial abnormality. 2. Moderate generalized cerebral atrophy, advanced for age. 3. Chronic medially displaced fracture deformity of the right lamina papyracea. 4. Mild ethmoid and maxillary sinus mucosal thickening.  CT cervical spine 9/29>>> 1. No evidence of acute fracture to the cervical spine. 2. Partially imaged airspace disease within the imaged lung apices (extensive on the left). Clinical correlation is recommended. 3. 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  EEG 9/29>>>Patient was noted to have frequent episodes of axial jerking with eye opening.  Concomitant EEG showed generalized polyspikes consistent with myoclonic seizures. EEG also showed continuous generalized background suppression. EEG was not reactive to tactile stimulation. Hyperventilation and photic stimulation were not performed  ECHO 9/30>>>No evidence of wall motion abnormality  Micro Data:  BC x 2 9/29>>>NG24hr Urine 9/29>>>UA few bacteria, WBC, RBC, CaOxalate, and Mucus Covid 9/29>>>negative  Antimicrobials:  Vancomycin 9/29>>>9/30 Cefepime 9/29>>>9/30  Interim history/subjective:   No acute events overnight.  Objective   Blood pressure 127/60, pulse 94, temperature 98.6 F (37 C), temperature source Bladder, resp. rate 12, height 5\' 6"  (1.676 m), weight 85.6 kg, SpO2 97 %.    Vent Mode: PRVC FiO2 (%):  [30 %] 30 % Set Rate:  [20 bmp] 20 bmp Vt Set:  [510 mL] 510 mL PEEP:  [5 cmH20] 5 cmH20 Plateau Pressure:  [16 cmH20-24 cmH20] 24 cmH20   Intake/Output Summary (Last 24 hours) at 11/05/2019 0918 Last data filed at 11/05/2019 0800 Gross per 24 hour  Intake 5161.92 ml  Output 4565 ml  Net 596.92 ml   Filed Weights   11/03/19 0500 11/04/19 0350 11/05/19 0113  Weight: 83.9 kg 85 kg 85.6 kg    Examination: General: intubated, sedated HENT: MMM, ETT, Pinpoint pupils reactive to light, EOM not illicited Lungs: Coarse breath sounds Cardiovascular: S1S2, RRR Abdomen: Soft, normoactive bowel sounds Extremities: warm and dry, no edema  GU: foley   Resolved Hospital Problem list     Assessment & Plan:   Myoclonic seizures and AMS A: Post arrest myoclonuic seizures, started on Keppra and Versed. Neuro consulted   P:  Per Neuro -Continue versed at 75 ml/hr and will await further recs from neuro to discontinue.  -Keppra 1500 mg twice daily - MRI tomorrow today to evaluate anoxic brain injury -Evaluate neuro status after 48 hours of normothermia treatment and sedation (10/2)  Cardiac arrest A: PEA on EMS arrival. No bystander CPR< unknown  downtime.  2 rounds CPR/epi with EMS prior to ROSC. EKG normal sinus rhythm with peaked T waves, K 4.2, Echo normal without wall abnormality.  P: -Normothermia - stop arctic sun today -Trend troponin -Maintain normal BP/HR -Labetalol prn -Nicardipine   Acute hypoxic respiratory failure A: Resp failure requiring airway protection intubation due to post-arrest myoclonic seizures, LUL infiltrates noted on CT and CXR in setting of ETOH hx and Hx of HIV, patient afebrile, WBC WNL, 9/30 ABG 7.4, pCO2 38.7, HCO3 24.4  P:  -Mechanical ventilation and sedation for 48 hours due to myoclonic seizures and post arrest -Ceftriaxone >>10/3  Hx ETOH abuse  A: AST 177, ALT 63, PT/INR 12.9/1.0  , TBili 1.0  P: -Thiamine  -Folate  -Monitor for withdrawal  -Sedation as above   Hx HIV - reportedly not taking meds.   A: Low suspicious for infection at this time per afebrile and normal WBC but last CD4 was <200  P: -CD4 ordered -Hold home meds for now   Nutrition/GI A: Intubated and sedated  P: - TF - Bowel regimen: Miralax daily, suppository if no BM today  Best practice:  Diet: TF Pain/Anxiety/Delirium protocol (if indicated): PAD protocol  VAP protocol (if indicated): ordered DVT prophylaxis: SQheparin  GI prophylaxis: ordered Glucose control: n/a Mobility: BR Code Status: full Family Communication: will update family today Disposition:  ICU  Labs   CBC: Recent Labs  Lab 11/01/19 2238 11/01/19 2238 11/02/19 0036 11/02/19 0505 11/03/19 0311 11/04/19 0403 11/05/19 0330  WBC 7.6  --  6.8  --  6.2 4.3 3.9*  HGB 13.6   < > 14.6 15.0 13.8 13.2 12.7*  HCT 45.6   < > 46.3 44.0 43.5 41.3 40.2  MCV 100.4*  --  97.5  --  94.0 95.4 93.9  PLT 142*  --  146*  --  139* 147* 161   < > = values in this interval not displayed.    Basic Metabolic Panel: Recent Labs  Lab 11/02/19 0036 11/02/19 0505 11/02/19 0951 11/02/19 2050 11/03/19 0311 11/04/19 0403 11/05/19 0330  NA 139    < > 136 135 135 135 136  K 3.3*   < > 4.2 3.1* 4.5 3.9 3.6  CL 102  --  101 102 106 104 104  CO2 20*  --  23 21* 21* 23 22  GLUCOSE 147*  --  144* 149* 133* 116* 105*  BUN 12  --  10 10 10 7 8   CREATININE 0.85  --  0.67 0.68 0.68 0.57* 0.56*  CALCIUM 8.3*  --  8.3* 8.1* 8.1* 8.3* 8.3*  MG 1.4*  --   --   --   --   --   --   PHOS 3.0  --   --   --   --   --   --    < > = values in this interval not displayed.   GFR: Estimated Creatinine Clearance: 108.2 mL/min (A) (by C-G formula based on SCr of 0.56 mg/dL (L)). Recent Labs  Lab 11/01/19 1720 11/01/19 1915 11/01/19 2238 11/01/19 2238 11/02/19 0036 11/03/19 0311 11/04/19 0403 11/05/19 0330  PROCALCITON  --   --  1.04  --   --   --   --   --   WBC   < >  --  7.6   < > 6.8 6.2 4.3 3.9*  LATICACIDVEN  --  4.4* 3.2*  --  2.4*  --   --   --    < > = values in this interval not displayed.    Liver Function Tests: Recent Labs  Lab 10/29/19 2245 11/01/19 1915  AST 81* 177*  ALT 66* 63*  ALKPHOS 103 132*  BILITOT 0.8 1.0  PROT 7.9 7.1  ALBUMIN 3.8 3.3*   Recent Labs  Lab 11/01/19 1915  LIPASE 48   No results for input(s): AMMONIA in the last 168 hours.  ABG    Component Value Date/Time   PHART 7.408 11/02/2019 0505   PCO2ART 38.7 11/02/2019 0505   PO2ART 103 11/02/2019 0505   HCO3 24.4 11/02/2019 0505   TCO2 26 11/02/2019 0505   ACIDBASEDEF 7.0 (H) 11/01/2019 2118   O2SAT 98.0 11/02/2019 0505     Coagulation Profile: Recent Labs  Lab 11/01/19 1915 11/02/19 0036 11/02/19 0756  INR 1.1 1.0 1.0    Cardiac Enzymes: No results for input(s): CKTOTAL, CKMB, CKMBINDEX, TROPONINI in the last 168 hours.  HbA1C: No results found for: HGBA1C  CBG: Recent Labs  Lab 11/04/19 1543 11/04/19 1944 11/04/19 2338 11/05/19 0341 11/05/19 0718  GLUCAP 99 119* 96 99 92   CC time: 35 minutes  Melody Comas, MD [facility] Office: [phone]   See Amion for Pager Details

## 2019-11-05 NOTE — Progress Notes (Addendum)
Briefly, patient is a 54 year old male whom the neurology service has been consulted for seizure management;myoclonic seizures noted following PEA arrest.   At this time, MRI Brain + final EEG report are pending. Will not make any changes at this time and recommend to continue Versed wean + continue maintenance Keppra.   Neurology will continue to follow.   Stark Jock, NP  Patient discussed with attending physician. Dr. Thomasena Edis   Patient seen, examined discussed plan with Stark Jock, NP. Agree with assessment and plan as documented above. I have independently reviewed the chart, obtained history, review of systems and examined the patient.   Dr. Marisue Humble [phone]

## 2019-11-05 NOTE — Progress Notes (Signed)
LTM EEG discontinued for MRI.

## 2019-11-05 NOTE — Progress Notes (Signed)
Pt A.M. K+ 3.6 with Creat 0.56 and GFR > 60. Elink Electrolyte protocol initiated.

## 2019-11-05 NOTE — Progress Notes (Signed)
LTM maint complete - no skin breakdown under:  Re hook complete no skin breakdown event button tested neuro notified

## 2019-11-05 NOTE — Procedures (Addendum)
Patient Name:Ryan Orozco VKF:[id number] Epilepsy Attending:Crescentia Boutwell Annabelle Harman Referring Physician/Provider:Kathyrn Whiteheart, NP Duration:11/04/2019 2225 to10/3/20211037 and 11/05/2019 2037 to 11/06/2019 0900  Patient history:54 year old male status post cardiac arrest. EEG to evaluate for seizures.  Level of alertness:Comatose  AEDs during EEG study:Versed, LEV  Technical aspects: This EEG study was done with scalp electrodes positioned according to the 10-20 International system of electrode placement. Electrical activity was acquired at a sampling rate of 500Hz  and reviewed with a high frequency filter of 70Hz  and a low frequency filter of 1Hz . EEG data were recorded continuously and digitally stored.   Description:EEGshowed continuous generalizedlow amplitude6 to 9 Hz theta-alpha activity as well as intermittent generalized 2 to 3 Hz delta slowing. EEGreactive to noxious stimulation. Hyperventilation and photic stimulation were not performed.   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  Breonia Kirstein 

## 2019-11-06 LAB — CULTURE, BLOOD (ROUTINE X 2)
Culture: NO GROWTH
Culture: NO GROWTH
Special Requests: ADEQUATE

## 2019-11-06 LAB — GLUCOSE, CAPILLARY
Glucose-Capillary: 121 mg/dL — ABNORMAL HIGH (ref 70–99)
Glucose-Capillary: 114 mg/dL — ABNORMAL HIGH (ref 70–99)
Glucose-Capillary: 95 mg/dL (ref 70–99)
Glucose-Capillary: 116 mg/dL — ABNORMAL HIGH (ref 70–99)
Glucose-Capillary: 105 mg/dL — ABNORMAL HIGH (ref 70–99)
Glucose-Capillary: 112 mg/dL — ABNORMAL HIGH (ref 70–99)

## 2019-11-06 LAB — BASIC METABOLIC PANEL
Anion gap: 4 — ABNORMAL LOW (ref 5–15)
BUN: 12 mg/dL (ref 6–20)
CO2: 26 mmol/L (ref 22–32)
Calcium: 8.3 mg/dL — ABNORMAL LOW (ref 8.9–10.3)
Chloride: 106 mmol/L (ref 98–111)
Creatinine, Ser: 0.65 mg/dL (ref 0.61–1.24)
GFR calc Af Amer: >60 mL/min (ref >60)
GFR calc non Af Amer: >60 mL/min (ref >60)
Glucose, Bld: 130 mg/dL — ABNORMAL HIGH (ref 70–99)
Potassium: 3.5 mmol/L (ref 3.5–5.1)
Sodium: 136 mmol/L (ref 135–145)

## 2019-11-06 LAB — HEPATIC FUNCTION PANEL
ALT: 27 U/L (ref 0–44)
AST: 47 U/L — ABNORMAL HIGH (ref 15–41)
Albumin: 2.5 g/dL — ABNORMAL LOW (ref 3.5–5.0)
Alkaline Phosphatase: 76 U/L (ref 38–126)
Bilirubin, Direct: 0.1 mg/dL (ref 0.0–0.2)
Indirect Bilirubin: 0.5 mg/dL (ref 0.3–0.9)
Total Bilirubin: 0.6 mg/dL (ref 0.3–1.2)
Total Protein: 5.8 g/dL — ABNORMAL LOW (ref 6.5–8.1)

## 2019-11-06 LAB — CBC
HCT: 38.2 % — ABNORMAL LOW (ref 39.0–52.0)
Hemoglobin: 12.1 g/dL — ABNORMAL LOW (ref 13.0–17.0)
MCH: 30.2 pg (ref 26.0–34.0)
MCHC: 31.7 g/dL (ref 30.0–36.0)
MCV: 95.3 fL (ref 80.0–100.0)
Platelets: 157 10*3/uL (ref 150–400)
RBC: 4.01 MIL/uL — ABNORMAL LOW (ref 4.22–5.81)
RDW: 14.4 % (ref 11.5–15.5)
WBC: 5.7 10*3/uL (ref 4.0–10.5)
nRBC: 0 % (ref 0.0–0.2)

## 2019-11-06 LAB — TROPONIN I (HIGH SENSITIVITY): Troponin I (High Sensitivity): 9 ng/L (ref <18)

## 2019-11-06 LAB — T-HELPER CELLS (CD4) COUNT (NOT AT ARMC)
CD4 % Helper T Cell: 33 % (ref 33–65)
CD4 T Cell Abs: 124 /uL — ABNORMAL LOW (ref 400–1790)

## 2019-11-06 MED ORDER — MIDAZOLAM 50MG/50ML (1MG/ML) PREMIX INFUSION
0.5000 mg/h | INTRAVENOUS | Status: DC
  Administered 2019-11-06: 2 mg/h via INTRAVENOUS
  Administered 2019-11-07: 10 mg/h via INTRAVENOUS
  Filled 2019-11-06 (×2): qty 50

## 2019-11-06 MED ORDER — ACETAMINOPHEN 160 MG/5ML PO SOLN
650.0000 mg | Freq: Three times a day (TID) | ORAL | Status: DC | PRN
  Administered 2019-11-06 (×2): 650 mg
  Filled 2019-11-06 (×2): qty 20.3

## 2019-11-06 MED ORDER — AMLODIPINE BESYLATE 5 MG PO TABS
5.0000 mg | ORAL_TABLET | Freq: Every day | ORAL | Status: DC
  Administered 2019-11-06 – 2019-11-07 (×2): 5 mg
  Filled 2019-11-06 (×2): qty 1

## 2019-11-06 MED ORDER — IBUPROFEN 100 MG/5ML PO SUSP
400.0000 mg | Freq: Once | ORAL | Status: AC
  Administered 2019-11-06: 400 mg
  Filled 2019-11-06: qty 20

## 2019-11-06 NOTE — Progress Notes (Signed)
Versed gtt restarted d/t patients agitation and questionable ETOH withdrawal. SBP 190's and HR 130's. Cardene gtt had to be increased to 15. Will attempt to decrease versed and cardene requirements throughout the night.

## 2019-11-06 NOTE — Progress Notes (Signed)
NAMETemiloluwa Orozco, MRN:  [id number], DOB:  1965/10/03, LOS: 5 ADMISSION DATE:  11/01/2019, CONSULTATION DATE:  9/29 REFERRING MD:  EDP, CHIEF COMPLAINT:  Cardiac arrest    Brief History   54yo male with hx HIV (last CD4 count 191 on 10/12/19), ETOH with recent ER visit 9/27 after being picked up by police intoxicated. Returned 9/29 after being found down outside convenience store.  Pulseless on EMS arrival with PEA.  Unknown downtime prior to EMS arrival. EMS performed 2 rounds of CPR with 2 epi before ROSC.  Admitted to ICU.   History of present illness   54yo male with hx HIV, ETOH with recent ER visit 9/27 after being picked up by police intoxicated. Returned 9/29 after being found down outside convenience store.  Pulseless on EMS arrival with PEA.  Unknown downtime prior to EMS arrival. EMS performed 2 rounds of CPR with 2 epi before ROSC, intubated en route..  Admitted to ICU   In ER lactate 4.4 post CPR, troponin 450, electrolytes wnl, ETOH 177  Past Medical History   has a past medical history of Alcohol abuse and HIV (human immunodeficiency virus infection) (HCC) (1995).   Significant Hospital Events     Consults:  Neuro  Procedures:  ETT 9/29 >>  Significant Diagnostic Tests:  CT head 9/29>  No evidence of acute intracranial abnormality. Moderate generalized cerebral atrophy, advanced for age. Chronic medially displaced fracture deformity of the right lamina papyracea. Mild ethmoid and maxillary sinus mucosal thickening.  CT cervical spine 9/29 > No evidence of acute fracture to the cervical spine. Partially imaged airspace disease within the imaged lung apices (extensive on the left). Clinical correlation is recommended. 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  EEG 9/29 Patient was noted to have frequent episodes of axial jerking with eye opening. Concomitant EEG showed generalized polyspikes  consistent with myoclonic seizures. EEG also showed continuous generalized background suppression. EEG was not reactive to tactile stimulation. Hyperventilation and photic stimulation were not performed  ECHO 9/30 >No evidence of wall motion abnormality  MRI 10/3 > There is mild DWI hyperintensity involving the caudate and possibly putamen bilaterally. Additionally, possible subtle FLAIR hyperintensity involving the cerebellum, cortex diffusely, and bilateral basal ganglia. Although not diagnostic, these findings could be seen with early hypoxic/ischemic brain injury in this patient status post PEA arrest.   Micro Data:  BC x 2 9/29>>>NG24hr Urine 9/29>>>UA few bacteria, WBC, RBC, CaOxalate, and Mucus Covid 9/29>>>negative  Antimicrobials:  Vancomycin 9/29>>>9/30 Cefepime 9/29>>>9/30  Interim history/subjective:   No acute events overnight. EEG ongoing. Weaning versed infusion. Remains on nicardipine.   Objective   Blood pressure (!) 120/59, pulse 76, temperature 99.1 F (37.3 C), temperature source Bladder, resp. rate 20, height 5\' 6"  (1.676 m), weight 82.6 kg, SpO2 100 %.    Vent Mode: PRVC FiO2 (%):  [30 %] 30 % Set Rate:  [20 bmp] 20 bmp Vt Set:  [510 mL] 510 mL PEEP:  [5 cmH20] 5 cmH20 Plateau Pressure:  [16 cmH20-17 cmH20] 16 cmH20   Intake/Output Summary (Last 24 hours) at 11/06/2019 0952 Last data filed at 11/06/2019 0745 Gross per 24 hour  Intake 4235.54 ml  Output 3395 ml  Net 840.54 ml   Filed Weights   11/04/19 0350 11/05/19 0113 11/06/19 0312  Weight: 85 kg 85.6 kg 82.6 kg    Examination: General: middle aged male, normal body habitus on vent.  HENT:  [place]/AT, PERRL 65mm bilaterally. No appreciable JVD Lungs: coarse. No distress. Synchronous with vent.  Cardiovascular: RRR, no MRG Abdomen: Soft, normoactive bowel sounds Extremities: warm and dry, no edema  GU: foley   Resolved Hospital Problem list     Assessment & Plan:   Myoclonic seizures and AMS:  Post arrest myoclonuic seizures, started on Keppra and Versed.  - Appreciate neurology assistance.  - Burst suppression with versed, weaning down. Now at 20mg  from 75  - Keppra 1500 mg twice daily ongoing.  - MRI tomorrow today to evaluate anoxic brain injury - Evaluate neuro status after 48 hours of normothermia treatment and sedation (10/2)  Cardiac arrest: PEA on EMS arrival. No bystander CPR< unknown downtime.  2 rounds CPR/epi with EMS prior to ROSC. EKG normal sinus rhythm with peaked T waves, K 4.2, Echo normal without wall abnormality. Arctic sun (normothermia) stopper 10/3.  - Trend troponin  - Nicardipine wean to off, will add amlodipine. Keep SBP < 160 mmHg - PRN labetalol   Acute hypoxic respiratory failure A: Resp failure requiring airway protection intubation due to post-arrest myoclonic seizures, LUL infiltrates noted on CT and CXR in setting of ETOH hx and Hx of HIV, patient afebrile, WBC WNL, 9/30 ABG 7.4, pCO2 38.7, HCO3 24.4 - Mechanical ventilation and sedation for 48 hours due to myoclonic seizures and post arrest - s/p course of CTX - not a candidate for SAT/SBT at this time.   Hx ETOH abuse  -Thiamine  -Folate  -Monitor for withdrawal  -Sedation as above   Hx HIV - reportedly not taking meds.  Low suspicious for infection at this time per afebrile and normal WBC but last CD4 was <200 -CD4 pending -Hold home meds for now   Nutrition/GI - TF - Bowel regimen: Miralax daily, suppository if no BM today  Best practice:  Diet: TF Pain/Anxiety/Delirium protocol (if indicated): PAD protocol  VAP protocol (if indicated): ordered DVT prophylaxis: SQheparin  GI prophylaxis: ordered Glucose control: n/a Mobility: BR Code Status: full Family Communication: Daughter updated. She is the contact person, not the sister. Her name is 10/30 [phone] Disposition:  ICU  Labs   CBC: Recent Labs  Lab 11/02/19 0036 11/02/19 0036 11/02/19 0505 11/03/19 0311  11/04/19 0403 11/05/19 0330 11/06/19 0350  WBC 6.8  --   --  6.2 4.3 3.9* 5.7  HGB 14.6   < > 15.0 13.8 13.2 12.7* 12.1*  HCT 46.3   < > 44.0 43.5 41.3 40.2 38.2*  MCV 97.5  --   --  94.0 95.4 93.9 95.3  PLT 146*  --   --  139* 147* 161 157   < > = values in this interval not displayed.    Basic Metabolic Panel: Recent Labs  Lab 11/02/19 0036 11/02/19 0505 11/02/19 2050 11/03/19 0311 11/04/19 0403 11/05/19 0330 11/06/19 0350  NA 139   < > 135 135 135 136 136  K 3.3*   < > 3.1* 4.5 3.9 3.6 3.5  CL 102   < > 102 106 104 104 106  CO2 20*   < > 21* 21* 23 22 26   GLUCOSE 147*   < > 149* 133* 116* 105* 130*  BUN 12   < > 10 10 7 8 12   CREATININE 0.85   < > 0.68 0.68 0.57* 0.56* 0.65  CALCIUM 8.3*   < > 8.1* 8.1* 8.3* 8.3* 8.3*  MG 1.4*  --   --   --   --   --   --  PHOS 3.0  --   --   --   --   --   --    < > = values in this interval not displayed.   GFR: Estimated Creatinine Clearance: 106.5 mL/min (by C-G formula based on SCr of 0.65 mg/dL). Recent Labs  Lab 11/01/19 1720 11/01/19 1915 11/01/19 2238 11/01/19 2238 11/02/19 0036 11/02/19 0036 11/03/19 0311 11/04/19 0403 11/05/19 0330 11/06/19 0350  PROCALCITON  --   --  1.04  --   --   --   --   --   --   --   WBC   < >  --  7.6   < > 6.8   < > 6.2 4.3 3.9* 5.7  LATICACIDVEN  --  4.4* 3.2*  --  2.4*  --   --   --   --   --    < > = values in this interval not displayed.    Liver Function Tests: Recent Labs  Lab 11/01/19 1915 11/06/19 0350  AST 177* 47*  ALT 63* 27  ALKPHOS 132* 76  BILITOT 1.0 0.6  PROT 7.1 5.8*  ALBUMIN 3.3* 2.5*   Recent Labs  Lab 11/01/19 1915  LIPASE 48   No results for input(s): AMMONIA in the last 168 hours.  ABG    Component Value Date/Time   PHART 7.408 11/02/2019 0505   PCO2ART 38.7 11/02/2019 0505   PO2ART 103 11/02/2019 0505   HCO3 24.4 11/02/2019 0505   TCO2 26 11/02/2019 0505   ACIDBASEDEF 7.0 (H) 11/01/2019 2118   O2SAT 98.0 11/02/2019 0505     Coagulation  Profile: Recent Labs  Lab 11/01/19 1915 11/02/19 0036 11/02/19 0756  INR 1.1 1.0 1.0    Cardiac Enzymes: No results for input(s): CKTOTAL, CKMB, CKMBINDEX, TROPONINI in the last 168 hours.  HbA1C: No results found for: HGBA1C  CBG: Recent Labs  Lab 11/05/19 1524 11/05/19 1933 11/05/19 2338 11/06/19 0346 11/06/19 0722  GLUCAP 88 105* 102* 121* 114*   CC time: 35 minutes  Joneen Roach, AGACNP-BC [facility]  See Amion for personal pager PCCM on call pager [phone]  11/06/2019 10:10 AM

## 2019-11-06 NOTE — Progress Notes (Signed)
eLink Physician-Brief Progress Note Patient Name: Ryan Orozco DOB: 1965/12/13 MRN: [id number]   Date of Service  11/06/2019  HPI/Events of Note  PRN tylenol requested for fever, also will order liver enzymes since last check was a few days ago  eICU Interventions  Will need to DC that order in case LFTS are higher      Intervention Category Minor Interventions: Routine modifications to care plan (e.g. PRN medications for pain, fever)  Svara Twyman G Osmond Steckman 11/06/2019, 2:20 AM

## 2019-11-06 NOTE — Progress Notes (Signed)
LTM maintenance completed; reprepped A1, O2, P8, and checked skin under Fp1 and Fp2; no skin breakdown was see,

## 2019-11-06 NOTE — Progress Notes (Signed)
eLink Physician-Brief Progress Note Patient Name: Ryan Orozco DOB: Jun 03, 1965 MRN: [id number]   Date of Service  11/06/2019  HPI/Events of Note  Fever to 101.5 F - AST elevated at 47. Patient on Tylenol per tube and Creatinine = 0.65. Patient is already ice packed.   eICU Interventions  Plan: 1. D/C Tylenal 2. Motrin 400 mg per tube X 1 dose.  3. Cooling blanket PRN.      Intervention Category Major Interventions: Other:  [name] 11/06/2019, 8:18 PM

## 2019-11-06 NOTE — Progress Notes (Addendum)
Subjective: No acute events overnight.  ROS: Unable to obtain due to poor mental status  Examination  Vital signs in last 24 hours: Temp:  [98.9 F (37.2 C)-100.6 F (38.1 C)] 99.9 F (37.7 C) (10/04 1200) Pulse Rate:  [70-105] 73 (10/04 1200) Resp:  [14-21] 20 (10/04 1200) BP: (120)/(59) 120/59 (10/04 0745) SpO2:  [95 %-100 %] 100 % (10/04 1200) Arterial Line BP: (104-162)/(54-81) 112/57 (10/04 1200) FiO2 (%):  [30 %] 30 % (10/04 0745) Weight:  [82.6 kg] 82.6 kg (10/04 0312)  General: lying in bed, not in apparent distress CVS: pulse-normal rate and rhythm RS: breathing comfortably, intubated Extremities: normal, warm Neuro: does not open eyes to noxious stimuli, does not follow commands, pupils equally round and reactive, corneal reflex intact, gag reflex intact, withdraws to noxious stimuli in all 4 extremities   Basic Metabolic Panel: Recent Labs  Lab 11/02/19 0036 11/02/19 0505 11/02/19 2050 11/02/19 2050 11/03/19 0311 11/03/19 0311 11/04/19 0403 11/05/19 0330 11/06/19 0350  NA 139   < > 135  --  135  --  135 136 136  K 3.3*   < > 3.1*  --  4.5  --  3.9 3.6 3.5  CL 102   < > 102  --  106  --  104 104 106  CO2 20*   < > 21*  --  21*  --  23 22 26   GLUCOSE 147*   < > 149*  --  133*  --  116* 105* 130*  BUN 12   < > 10  --  10  --  7 8 12   CREATININE 0.85   < > 0.68  --  0.68  --  0.57* 0.56* 0.65  CALCIUM 8.3*   < > 8.1*   < > 8.1*   < > 8.3* 8.3* 8.3*  MG 1.4*  --   --   --   --   --   --   --   --   PHOS 3.0  --   --   --   --   --   --   --   --    < > = values in this interval not displayed.    CBC: Recent Labs  Lab 11/02/19 0036 11/02/19 0036 11/02/19 0505 11/03/19 0311 11/04/19 0403 11/05/19 0330 11/06/19 0350  WBC 6.8  --   --  6.2 4.3 3.9* 5.7  HGB 14.6   < > 15.0 13.8 13.2 12.7* 12.1*  HCT 46.3   < > 44.0 43.5 41.3 40.2 38.2*  MCV 97.5  --   --  94.0 95.4 93.9 95.3  PLT 146*  --   --  139* 147* 161 157   < > = values in this interval not  displayed.     Coagulation Studies: No results for input(s): LABPROT, INR in the last 72 hours.  Imaging MRI brain with and without contrast 11/05/2019: There is mild DWI hyperintensity involving the caudate and possibly putamen bilaterally. Additionally, possible subtle FLAIR hyperintensity involving the cerebellum, cortex diffusely, and bilateral basal ganglia. Although not diagnostic, these findings could be seen with early hypoxic/ischemic brain injury in this patient status post PEA arrest.  ASSESSMENT AND PLAN: 54 year old male with past medical history of alcohol use, HIV, hypertension who was found outside a convenience store. EMS found him in PEA arrest with unknown downtime and required 2 rounds of CPR with ROSC. He was noted to have myoclonic seizures on arrival and was started  on Keppra and Versed.  Cardiac arrest Suspected anoxic/hypoxic brain injury Myoclonic status epilepticus (resolved) -No further seizures overnight after Versed.  Clinical exam also appears to be improved with cranial nerves intact as well as withdrawal in all 4 extremities today  Recommendation -Wean off Versed at 10 mL/h to stop -Continue Keppra to 1500 mg twice daily -If no seizures after weaning of Versed, will discontinue LTM EEG tomorrow -Patient had myoclonic seizures in the beginning.  However we were able to control them with Versed and Keppra.  Currently EEG shows generalized low amplitude slowing consistent with severe diffuse encephalopathy.  MRI brain also did not show signs of severe anoxic/hypoxic brain injury. -Given patient's exam, EEG findings and MRI findings, it appears that patient might be able to continue to have neurologic recovery. -We will wait for 24 to 48 hours after Versed has been turned off (11/08/2019) and continue with daily neurologic exams for prognosis  -Neuroprotective measures including normoglycemia, normothermia, normonatremia -Seizure precautions -Management of  rest of comorbidities per primary team  CRITICAL CARE Performed by: Charlsie Quest   Total critical care time:37minutes  Critical care time was exclusive of separately billable procedures and treating other patients.  Critical care was necessary to treat or prevent imminent or life-threatening deterioration.  Critical care was time spent personally by me on the following activities: development of treatment plan with patient and/or surrogate as well as nursing, discussions with consultants, evaluation of patient's response to treatment, examination of patient, obtaining history from patient or surrogate, ordering and performing treatments and interventions, ordering and review of laboratory studies, ordering and review of radiographic studies, pulse oximetry and re-evaluation of patient's condition.  Lindie Spruce Epilepsy [facility] For questions after 5pm please refer to AMION to reach the Neurologist on call

## 2019-11-07 ENCOUNTER — Inpatient Hospital Stay (HOSPITAL_COMMUNITY): Payer: Medicaid Other

## 2019-11-07 DIAGNOSIS — F1010 Alcohol abuse, uncomplicated: Secondary | ICD-10-CM

## 2019-11-07 DIAGNOSIS — G931 Anoxic brain damage, not elsewhere classified: Secondary | ICD-10-CM | POA: Diagnosis present

## 2019-11-07 DIAGNOSIS — J189 Pneumonia, unspecified organism: Secondary | ICD-10-CM

## 2019-11-07 LAB — CBC
HCT: 39.1 % (ref 39.0–52.0)
Hemoglobin: 12.4 g/dL — ABNORMAL LOW (ref 13.0–17.0)
MCH: 30.5 pg (ref 26.0–34.0)
MCHC: 31.7 g/dL (ref 30.0–36.0)
MCV: 96.3 fL (ref 80.0–100.0)
Platelets: 185 10*3/uL (ref 150–400)
RBC: 4.06 MIL/uL — ABNORMAL LOW (ref 4.22–5.81)
RDW: 14.5 % (ref 11.5–15.5)
WBC: 10.1 10*3/uL (ref 4.0–10.5)
nRBC: 0 % (ref 0.0–0.2)

## 2019-11-07 LAB — GLUCOSE, CAPILLARY
Glucose-Capillary: 108 mg/dL — ABNORMAL HIGH (ref 70–99)
Glucose-Capillary: 111 mg/dL — ABNORMAL HIGH (ref 70–99)
Glucose-Capillary: 111 mg/dL — ABNORMAL HIGH (ref 70–99)
Glucose-Capillary: 114 mg/dL — ABNORMAL HIGH (ref 70–99)
Glucose-Capillary: 114 mg/dL — ABNORMAL HIGH (ref 70–99)
Glucose-Capillary: 127 mg/dL — ABNORMAL HIGH (ref 70–99)

## 2019-11-07 LAB — COMPREHENSIVE METABOLIC PANEL
ALT: 26 U/L (ref 0–44)
AST: 42 U/L — ABNORMAL HIGH (ref 15–41)
Albumin: 2.8 g/dL — ABNORMAL LOW (ref 3.5–5.0)
Alkaline Phosphatase: 83 U/L (ref 38–126)
Anion gap: 10 (ref 5–15)
BUN: 11 mg/dL (ref 6–20)
CO2: 23 mmol/L (ref 22–32)
Calcium: 8.8 mg/dL — ABNORMAL LOW (ref 8.9–10.3)
Chloride: 106 mmol/L (ref 98–111)
Creatinine, Ser: 0.63 mg/dL (ref 0.61–1.24)
GFR calc Af Amer: >60 mL/min (ref >60)
GFR calc non Af Amer: >60 mL/min (ref >60)
Glucose, Bld: 125 mg/dL — ABNORMAL HIGH (ref 70–99)
Potassium: 3.8 mmol/L (ref 3.5–5.1)
Sodium: 139 mmol/L (ref 135–145)
Total Bilirubin: 0.4 mg/dL (ref 0.3–1.2)
Total Protein: 6.8 g/dL (ref 6.5–8.1)

## 2019-11-07 LAB — MAGNESIUM: Magnesium: 1.8 mg/dL (ref 1.7–2.4)

## 2019-11-07 MED ORDER — ACETAMINOPHEN 160 MG/5ML PO SOLN
650.0000 mg | Freq: Four times a day (QID) | ORAL | Status: DC | PRN
  Administered 2019-11-07 – 2019-12-17 (×15): 650 mg
  Filled 2019-11-07 (×15): qty 20.3

## 2019-11-07 MED ORDER — FUROSEMIDE 10 MG/ML IJ SOLN
40.0000 mg | Freq: Once | INTRAMUSCULAR | Status: AC
  Administered 2019-11-07: 40 mg via INTRAVENOUS
  Filled 2019-11-07: qty 4

## 2019-11-07 MED ORDER — AMLODIPINE BESYLATE 10 MG PO TABS
10.0000 mg | ORAL_TABLET | Freq: Every day | ORAL | Status: DC
  Administered 2019-11-08 – 2019-11-13 (×6): 10 mg
  Filled 2019-11-07 (×6): qty 1

## 2019-11-07 MED ORDER — MAGNESIUM SULFATE 2 GM/50ML IV SOLN
2.0000 g | Freq: Once | INTRAVENOUS | Status: AC
  Administered 2019-11-07: 2 g via INTRAVENOUS
  Filled 2019-11-07: qty 50

## 2019-11-07 MED ORDER — SODIUM CHLORIDE 0.9 % IV SOLN
2.0000 g | Freq: Three times a day (TID) | INTRAVENOUS | Status: AC
  Administered 2019-11-07 – 2019-11-13 (×20): 2 g via INTRAVENOUS
  Filled 2019-11-07 (×21): qty 2

## 2019-11-07 MED ORDER — PROPOFOL 1000 MG/100ML IV EMUL
5.0000 ug/kg/min | INTRAVENOUS | Status: DC
  Administered 2019-11-07: 35 ug/kg/min via INTRAVENOUS
  Administered 2019-11-07: 20 ug/kg/min via INTRAVENOUS
  Administered 2019-11-08: 30 ug/kg/min via INTRAVENOUS
  Administered 2019-11-08 (×3): 40 ug/kg/min via INTRAVENOUS
  Administered 2019-11-09: 10 ug/kg/min via INTRAVENOUS
  Administered 2019-11-09: 30 ug/kg/min via INTRAVENOUS
  Administered 2019-11-09 (×2): 40 ug/kg/min via INTRAVENOUS
  Administered 2019-11-10: 30 ug/kg/min via INTRAVENOUS
  Administered 2019-11-10 – 2019-11-11 (×3): 15 ug/kg/min via INTRAVENOUS
  Administered 2019-11-11: 7.5 ug/kg/min via INTRAVENOUS
  Administered 2019-11-12: 5 ug/kg/min via INTRAVENOUS
  Filled 2019-11-07 (×16): qty 100

## 2019-11-07 NOTE — Progress Notes (Addendum)
NAMEBranko Orozco, MRN:  [id number], DOB:  Sep 19, 1965, LOS: 6 ADMISSION DATE:  11/01/2019, CONSULTATION DATE:  9/29 REFERRING MD:  EDP, CHIEF COMPLAINT:  Cardiac arrest    Brief History   54yo male with hx HIV (last CD4 count 191 on 10/12/19), ETOH with recent ER visit 9/27 after being picked up by police intoxicated. Returned 9/29 after being found down outside convenience store.  Pulseless on EMS arrival with PEA.  Unknown downtime prior to EMS arrival. EMS performed 2 rounds of CPR with 2 epi before ROSC.  Admitted to ICU.   History of present illness   53yo male with hx HIV, ETOH with recent ER visit 9/27 after being picked up by police intoxicated. Returned 9/29 after being found down outside convenience store.  Pulseless on EMS arrival with PEA.  Unknown downtime prior to EMS arrival. EMS performed 2 rounds of CPR with 2 epi before ROSC, intubated en route..  Admitted to ICU   In ER lactate 4.4 post CPR, troponin 450, electrolytes wnl, ETOH 177  Past Medical History   has a past medical history of Alcohol abuse and HIV (human immunodeficiency virus infection) (HCC) (1995).   Significant Hospital Events     Consults:  Neuro  Procedures:  ETT 9/29 >>  Significant Diagnostic Tests:  CT head 9/29>  No evidence of acute intracranial abnormality. Moderate generalized cerebral atrophy, advanced for age. Chronic medially displaced fracture deformity of the right lamina papyracea. Mild ethmoid and maxillary sinus mucosal thickening.  CT cervical spine 9/29 > No evidence of acute fracture to the cervical spine. Partially imaged airspace disease within the imaged lung apices (extensive on the left). Clinical correlation is recommended. 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  EEG 9/29 Patient was noted to have frequent episodes of axial jerking with eye opening. Concomitant EEG showed generalized polyspikes  consistent with myoclonic seizures. EEG also showed continuous generalized background suppression. EEG was not reactive to tactile stimulation. Hyperventilation and photic stimulation were not performed  ECHO 9/30 >No evidence of wall motion abnormality  MRI 10/3 > There is mild DWI hyperintensity involving the caudate and possibly putamen bilaterally. Additionally, possible subtle FLAIR hyperintensity involving the cerebellum, cortex diffusely, and bilateral basal ganglia. Although not diagnostic, these findings could be seen with early hypoxic/ischemic brain injury in this patient status post PEA arrest.   Micro Data:  BC x 2 9/29>>>NG24hr Urine 9/29>>>UA few bacteria, WBC, RBC, CaOxalate, and Mucus Covid 9/29>>>negative  Antimicrobials:  Vancomycin 9/29>>>9/30 Cefepime 9/29>>>9/30 Cefepime 10/5 >>>  Interim history/subjective:   EEG ongoing. Versed restarted overnight due to agitation. Nicardipine increased.   Objective   Blood pressure (!) 137/103, pulse (!) 106, temperature 99.9 F (37.7 C), resp. rate (!) 24, height 5\' 6"  (1.676 m), weight 82.1 kg, SpO2 98 %.    Vent Mode: PRVC FiO2 (%):  [30 %] 30 % Set Rate:  [20 bmp] 20 bmp Vt Set:  [510 mL] 510 mL PEEP:  [5 cmH20] 5 cmH20 Plateau Pressure:  [14 cmH20-18 cmH20] 14 cmH20   Intake/Output Summary (Last 24 hours) at 11/07/2019 0946 Last data filed at 11/07/2019 0900 Gross per 24 hour  Intake 4221.24 ml  Output 4040 ml  Net 181.24 ml   Filed Weights   11/05/19 0113 11/06/19 0312 11/07/19 0318  Weight: 85.6 kg 82.6 kg 82.1 kg    Examination:  General: middle aged male, normal body habitus on vent. Tremulous.  HENT: [place]/AT, PERRL 3mm bilaterally. No appreciable JVD Lungs: coarse mechanical breath sounds, no distress.  Cardiovascular: RRR, no MRG Abdomen: Soft, normoactive bowel sounds Extremities: warm and dry, 1+ edema  GU: foley, scrotal edema.   Resolved Hospital Problem list     Assessment & Plan:    Myoclonic seizures and AMS: Post arrest myoclonuic seizures, started on Keppra and Versed.  - Appreciate neurology assistance.  - Frequent neuro checks - Versed off. Keep off. If further sedation needed would favor propofol for quick off.  - Keppra 1500 mg twice daily ongoing.  - Repeat MRI depending on exam once versed clears, so ideally tomorrow if we decide to scan.   Cardiac arrest: PEA on EMS arrival. No bystander CPR< unknown downtime.  2 rounds CPR/epi with EMS prior to ROSC. EKG normal sinus rhythm with peaked T waves, K 4.2, Echo normal without wall abnormality. Arctic sun (normothermia) stopped 10/3.  - Nicardipine wean to off, will add amlodipine. Keep SBP < 160 mmHg - increase amlodipine to 10mg  - PRN labetalol   Acute hypoxic respiratory failure A: Resp failure requiring airway protection intubation due to post-arrest myoclonic seizures, LUL infiltrates noted on CT and CXR in setting of ETOH hx and Hx of HIV. Spiked fever overnight and WBC increase. Increased ETT secretions. Rule out pneumnia. Also peripheral edema, Volume up on exam. - Full vent support - minimal vent settings, OK to try SBT, but not currently a candidate for extubation.  - CXR - add cefepime, MRSA PCR negative. Check respiratory culture.  - check PCT - Lasix 40mg  x 1 - not a candidate for SBT at this time.   Hx ETOH abuse  - Thiamine  - Folate  - Sedation as above   Hx HIV - reportedly not taking meds.  CD4 124 - Hold home meds for now   Nutrition/GI - TF - Bowel regimen: Miralax daily, suppository if no BM today  Best practice:  Diet: TF Pain/Anxiety/Delirium protocol (if indicated): PAD protocol  VAP protocol (if indicated): ordered DVT prophylaxis: SQ heparin  GI prophylaxis: ordered Glucose control: n/a Mobility: BR Code Status: full Family Communication: Daughter updated 10/5 [phone] Disposition:  ICU  Labs   CBC: Recent Labs  Lab 11/03/19 0311  11/04/19 0403 11/05/19 0330 11/06/19 0350 11/07/19 0320  WBC 6.2 4.3 3.9* 5.7 10.1  HGB 13.8 13.2 12.7* 12.1* 12.4*  HCT 43.5 41.3 40.2 38.2* 39.1  MCV 94.0 95.4 93.9 95.3 96.3  PLT 139* 147* 161 157 185    Basic Metabolic Panel: Recent Labs  Lab 11/02/19 0036 11/02/19 0505 11/03/19 0311 11/04/19 0403 11/05/19 0330 11/06/19 0350 11/07/19 0320  NA 139   < > 135 135 136 136 139  K 3.3*   < > 4.5 3.9 3.6 3.5 3.8  CL 102   < > 106 104 104 106 106  CO2 20*   < > 21* 23 22 26 23   GLUCOSE 147*   < > 133* 116* 105* 130* 125*  BUN 12   < > 10 7 8 12 11   CREATININE 0.85   < > 0.68 0.57* 0.56* 0.65 0.63  CALCIUM 8.3*   < > 8.1* 8.3* 8.3* 8.3* 8.8*  MG 1.4*  --   --   --   --   --  1.8  PHOS 3.0  --   --   --   --   --   --    < > = values in this interval  not displayed.   GFR: Estimated Creatinine Clearance: 106.2 mL/min (by C-G formula based on SCr of 0.63 mg/dL). Recent Labs  Lab 11/01/19 1720 11/01/19 1915 11/01/19 2238 11/01/19 2238 11/02/19 0036 11/03/19 0311 11/04/19 0403 11/05/19 0330 11/06/19 0350 11/07/19 0320  PROCALCITON  --   --  1.04  --   --   --   --   --   --   --   WBC   < >  --  7.6   < > 6.8   < > 4.3 3.9* 5.7 10.1  LATICACIDVEN  --  4.4* 3.2*  --  2.4*  --   --   --   --   --    < > = values in this interval not displayed.    Liver Function Tests: Recent Labs  Lab 11/01/19 1915 11/06/19 0350 11/07/19 0320  AST 177* 47* 42*  ALT 63* 27 26  ALKPHOS 132* 76 83  BILITOT 1.0 0.6 0.4  PROT 7.1 5.8* 6.8  ALBUMIN 3.3* 2.5* 2.8*   Recent Labs  Lab 11/01/19 1915  LIPASE 48   No results for input(s): AMMONIA in the last 168 hours.  ABG    Component Value Date/Time   PHART 7.408 11/02/2019 0505   PCO2ART 38.7 11/02/2019 0505   PO2ART 103 11/02/2019 0505   HCO3 24.4 11/02/2019 0505   TCO2 26 11/02/2019 0505   ACIDBASEDEF 7.0 (H) 11/01/2019 2118   O2SAT 98.0 11/02/2019 0505     Coagulation Profile: Recent Labs  Lab 11/01/19 1915  11/02/19 0036 11/02/19 0756  INR 1.1 1.0 1.0    Cardiac Enzymes: No results for input(s): CKTOTAL, CKMB, CKMBINDEX, TROPONINI in the last 168 hours.  HbA1C: No results found for: HGBA1C  CBG: Recent Labs  Lab 11/06/19 1512 11/06/19 1921 11/06/19 2313 11/07/19 0312 11/07/19 0716  GLUCAP 116* 105* 112* 114* 114*   CC time: 41 minutes  Joneen Roach, AGACNP-BC [facility]  See Amion for personal pager PCCM on call pager [phone]  11/07/2019 9:46 AM

## 2019-11-07 NOTE — Progress Notes (Signed)
LTM maintenance completed; reprepped under O2, P8, O1, P7, P3, and A1. No skin breakdown was seen.

## 2019-11-07 NOTE — Procedures (Addendum)
Patient Name:Ryan Orozco YME:[id number] Epilepsy Attending:Huxley Shurley Annabelle Harman Referring Physician/Provider:Kathyrn Whiteheart, NP Duration:11/06/2019 0900 to 11/07/2019 6808  Patient history:54 year old male status post cardiac arrest. EEG to evaluate for seizures.  Level of alertness:Comatose  AEDs during EEG study:Versed, LEV  Technical aspects: This EEG study was done with scalp electrodes positioned according to the 10-20 International system of electrode placement. Electrical activity was acquired at a sampling rate of 500Hz  and reviewed with a high frequency filter of 70Hz  and a low frequency filter of 1Hz . EEG data were recorded continuously and digitally stored.   Description:EEGshowed continuous generalizedlow amplitude6 to 9 Hz theta-alpha activity as well as intermittent generalized 2 to 3 Hz delta slowing. EEGreactive to noxious stimulation. Hyperventilation and photic stimulation were not performed.   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  [name] 

## 2019-11-07 NOTE — Progress Notes (Signed)
Subjective: Patient was febrile overnight.  Was on Versed for ventilator dyssynchrony.  ROS: Unable to obtain due to poor mental status  Examination  Vital signs in last 24 hours: Temp:  [99.1 F (37.3 C)-102.6 F (39.2 C)] 99.9 F (37.7 C) (10/05 0900) Pulse Rate:  [72-115] 106 (10/05 0900) Resp:  [17-35] 24 (10/05 0900) BP: (125-149)/(61-103) 137/103 (10/05 0900) SpO2:  [94 %-100 %] 98 % (10/05 0900) Arterial Line BP: (112-182)/(55-79) 177/79 (10/05 0900) FiO2 (%):  [30 %] 30 % (10/05 0730) Weight:  [82.1 kg] 82.1 kg (10/05 0318)  General: lying in bed, not in apparent distress CVS: pulse-normal rate and rhythm RS: breathing comfortably, intubated Extremities: normal, warm Neuro:  Opens eyes to noxious stimuli with eyes deviated upwards, does not follow commands, pupils equally round and reactive, corneal reflex intact, gag reflex intact, withdraws to noxious stimuli in all 4 extremities, appears to have myoclonus/shivering-like movements on stimulation.  Basic Metabolic Panel: Recent Labs  Lab 11/02/19 0036 11/02/19 0505 11/03/19 0311 11/03/19 0311 11/04/19 0403 11/04/19 0403 11/05/19 0330 11/06/19 0350 11/07/19 0320  NA 139   < > 135  --  135  --  136 136 139  K 3.3*   < > 4.5  --  3.9  --  3.6 3.5 3.8  CL 102   < > 106  --  104  --  104 106 106  CO2 20*   < > 21*  --  23  --  22 26 23   GLUCOSE 147*   < > 133*  --  116*  --  105* 130* 125*  BUN 12   < > 10  --  7  --  8 12 11   CREATININE 0.85   < > 0.68  --  0.57*  --  0.56* 0.65 0.63  CALCIUM 8.3*   < > 8.1*   < > 8.3*   < > 8.3* 8.3* 8.8*  MG 1.4*  --   --   --   --   --   --   --  1.8  PHOS 3.0  --   --   --   --   --   --   --   --    < > = values in this interval not displayed.    CBC: Recent Labs  Lab 11/03/19 0311 11/04/19 0403 11/05/19 0330 11/06/19 0350 11/07/19 0320  WBC 6.2 4.3 3.9* 5.7 10.1  HGB 13.8 13.2 12.7* 12.1* 12.4*  HCT 43.5 41.3 40.2 38.2* 39.1  MCV 94.0 95.4 93.9 95.3 96.3  PLT  139* 147* 161 157 185     Coagulation Studies: No results for input(s): LABPROT, INR in the last 72 hours.  Imaging No new brain imaging overnight.  ASSESSMENT AND PLAN: 54 year old male with past medical history of alcohol use, HIV, hypertension who was found outside a convenience store. EMS found him in PEA arrest with unknown downtime and required 2 rounds of CPR with ROSC. He was noted to have myoclonic seizures on arrival and was started on Keppra and Versed.  Cardiac arrest Suspected anoxic/hypoxic brain injury Myoclonic status epilepticus(resolved) -No further seizures overnight.  Opens eyes with noxious stimuli but eyes are deviated upwards, has myoclonus-like movements on stimulation.  Recommendation -ContinueKeppra to 1500 mg twice daily -Discontinue LTM EEG as patient has not any further seizures and has been off sedation. -Patient had myoclonic seizures in the beginning.  However we were able to control them with Versed and Keppra.  Currently EEG shows generalized low amplitude slowing consistent with severe diffuse encephalopathy.  MRI brain also did not show signs of severe anoxic/hypoxic brain injury. -Given patient's exam, EEG findings and MRI findings, it appears that patient might be able to continue to have neurologic recovery. Goal is to continue to minimize sedation for neuro prognostication -We will wait for 24 to 48 hours after sedation has been turned off  and continue with daily neurologic exams for prognosis -I called and spoke with patient's daughter Ms. Salami.  Of note, patient's daughter is a Sports coach.  I discussed patient's current medical status with daughter.  Patient's daughter states she never had a chance to discuss goals of care with her father and therefore will discuss with her family whether she would want to pursue trach and PEG in future if needed.  -Neuroprotective measures including normoglycemia, normothermia, normonatremia -Seizure  precautions -Management of rest of comorbidities per primary team  I have spent a total of  35  minutes with the patient reviewing hospital notes,  test results, labs and examining the patient as well as establishing an assessment and plan that was discussed personally with the patient's daughter, RN and physician Dr. Kendrick Fries.  > 50% of time was spent in direct patient care.       Lindie Spruce Epilepsy [facility] For questions after 5pm please refer to AMION to reach the Neurologist on call

## 2019-11-07 NOTE — Progress Notes (Signed)
Mg 1.8 Replaced per protocol  

## 2019-11-07 NOTE — Progress Notes (Signed)
LTM discontinued; no skin breakdown was seen. 

## 2019-11-08 ENCOUNTER — Ambulatory Visit: Admitting: Internal Medicine

## 2019-11-08 DIAGNOSIS — J9601 Acute respiratory failure with hypoxia: Secondary | ICD-10-CM | POA: Diagnosis present

## 2019-11-08 LAB — CBC
HCT: 36.5 % — ABNORMAL LOW (ref 39.0–52.0)
Hemoglobin: 11.7 g/dL — ABNORMAL LOW (ref 13.0–17.0)
MCH: 30.7 pg (ref 26.0–34.0)
MCHC: 32.1 g/dL (ref 30.0–36.0)
MCV: 95.8 fL (ref 80.0–100.0)
Platelets: 168 10*3/uL (ref 150–400)
RBC: 3.81 MIL/uL — ABNORMAL LOW (ref 4.22–5.81)
RDW: 14.5 % (ref 11.5–15.5)
WBC: 6.6 10*3/uL (ref 4.0–10.5)
nRBC: 0 % (ref 0.0–0.2)

## 2019-11-08 LAB — GLUCOSE, CAPILLARY
Glucose-Capillary: 108 mg/dL — ABNORMAL HIGH (ref 70–99)
Glucose-Capillary: 95 mg/dL (ref 70–99)
Glucose-Capillary: 106 mg/dL — ABNORMAL HIGH (ref 70–99)
Glucose-Capillary: 102 mg/dL — ABNORMAL HIGH (ref 70–99)
Glucose-Capillary: 114 mg/dL — ABNORMAL HIGH (ref 70–99)
Glucose-Capillary: 94 mg/dL (ref 70–99)

## 2019-11-08 LAB — COMPREHENSIVE METABOLIC PANEL
ALT: 22 U/L (ref 0–44)
AST: 33 U/L (ref 15–41)
Albumin: 2.6 g/dL — ABNORMAL LOW (ref 3.5–5.0)
Alkaline Phosphatase: 77 U/L (ref 38–126)
Anion gap: 9 (ref 5–15)
BUN: 12 mg/dL (ref 6–20)
CO2: 25 mmol/L (ref 22–32)
Calcium: 8.9 mg/dL (ref 8.9–10.3)
Chloride: 104 mmol/L (ref 98–111)
Creatinine, Ser: 0.54 mg/dL — ABNORMAL LOW (ref 0.61–1.24)
GFR calc non Af Amer: >60 mL/min (ref >60)
Glucose, Bld: 118 mg/dL — ABNORMAL HIGH (ref 70–99)
Potassium: 3.7 mmol/L (ref 3.5–5.1)
Sodium: 138 mmol/L (ref 135–145)
Total Bilirubin: 0.6 mg/dL (ref 0.3–1.2)
Total Protein: 6.3 g/dL — ABNORMAL LOW (ref 6.5–8.1)

## 2019-11-08 LAB — NEURON-SPECIFIC ENOLASE(NSE), BLOOD
Neuron-specific Enolase, Serum: 29.1 ng/mL — ABNORMAL HIGH (ref 0.0–17.6)
Neuron-specific Enolase, Serum: 18.3 ng/mL — ABNORMAL HIGH (ref 0.0–17.6)

## 2019-11-08 LAB — PROCALCITONIN: Procalcitonin: <0.1 ng/mL

## 2019-11-08 LAB — MAGNESIUM: Magnesium: 2.2 mg/dL (ref 1.7–2.4)

## 2019-11-08 LAB — TRIGLYCERIDES: Triglycerides: 110 mg/dL (ref <150)

## 2019-11-08 MED ORDER — PANTOPRAZOLE SODIUM 40 MG PO PACK
40.0000 mg | PACK | Freq: Every day | ORAL | Status: DC
  Administered 2019-11-08 – 2019-11-13 (×4): 40 mg
  Filled 2019-11-08 (×7): qty 20

## 2019-11-08 MED ORDER — FOLIC ACID 1 MG PO TABS
1.0000 mg | ORAL_TABLET | Freq: Every day | ORAL | Status: DC
  Administered 2019-11-08 – 2020-04-08 (×151): 1 mg
  Filled 2019-11-08 (×153): qty 1

## 2019-11-08 MED ORDER — METOPROLOL TARTRATE 25 MG/10 ML ORAL SUSPENSION
12.5000 mg | Freq: Two times a day (BID) | ORAL | Status: DC
  Administered 2019-11-08 (×2): 12.5 mg
  Filled 2019-11-08 (×2): qty 10

## 2019-11-08 MED ORDER — VITAL AF 1.2 CAL PO LIQD: 1000.0000 mL | ORAL | Status: DC

## 2019-11-08 MED ORDER — THIAMINE HCL 100 MG PO TABS
100.0000 mg | ORAL_TABLET | Freq: Every day | ORAL | Status: DC
  Administered 2019-11-08 – 2020-04-08 (×151): 100 mg
  Filled 2019-11-08 (×152): qty 1

## 2019-11-08 MED ORDER — POTASSIUM CHLORIDE 20 MEQ/15ML (10%) PO SOLN
40.0000 meq | Freq: Once | ORAL | Status: AC
  Administered 2019-11-08: 40 meq
  Filled 2019-11-08: qty 30

## 2019-11-08 MED ORDER — FUROSEMIDE 10 MG/ML IJ SOLN
40.0000 mg | Freq: Once | INTRAMUSCULAR | Status: AC
  Administered 2019-11-08: 40 mg via INTRAVENOUS
  Filled 2019-11-08: qty 4

## 2019-11-08 NOTE — Progress Notes (Signed)
Subjective: No acute events overnight.  Patient's daughter was at bedside today.  ROS: Unable to obtain due to poor mental status  Examination  Vital signs in last 24 hours: Temp:  [99.7 F (37.6 C)-100.9 F (38.3 C)] 100.6 F (38.1 C) (10/06 1300) Pulse Rate:  [81-111] 103 (10/06 1300) Resp:  [14-33] 19 (10/06 1300) BP: (114-159)/(70-100) 147/84 (10/06 1300) SpO2:  [93 %-100 %] 98 % (10/06 1300) Arterial Line BP: (132-191)/(61-91) 158/72 (10/06 1300) FiO2 (%):  [30 %] 30 % (10/06 0705) Weight:  [80.7 kg] 80.7 kg (10/06 0231)  General: lying in bed,not in apparent distress CVS: pulse-normal rate and rhythm RS: breathing comfortably,intubated Extremities: normal,warm Neuro: Opens eyes spontaneously but not looking at the examiner, does not follow commands, pupils equally round and reactive, corneal reflex intact, gag reflex intact, does not withdraw to noxious stimuli in all 4 extremities  Basic Metabolic Panel: Recent Labs  Lab 11/02/19 0036 11/02/19 0505 11/04/19 0403 11/04/19 0403 11/05/19 0330 11/05/19 0330 11/06/19 0350 11/07/19 0320 11/08/19 0302  NA 139   < > 135  --  136  --  136 139 138  K 3.3*   < > 3.9  --  3.6  --  3.5 3.8 3.7  CL 102   < > 104  --  104  --  106 106 104  CO2 20*   < > 23  --  22  --  26 23 25   GLUCOSE 147*   < > 116*  --  105*  --  130* 125* 118*  BUN 12   < > 7  --  8  --  12 11 12   CREATININE 0.85   < > 0.57*  --  0.56*  --  0.65 0.63 0.54*  CALCIUM 8.3*   < > 8.3*   < > 8.3*   < > 8.3* 8.8* 8.9  MG 1.4*  --   --   --   --   --   --  1.8 2.2  PHOS 3.0  --   --   --   --   --   --   --   --    < > = values in this interval not displayed.    CBC: Recent Labs  Lab 11/04/19 0403 11/05/19 0330 11/06/19 0350 11/07/19 0320 11/08/19 0302  WBC 4.3 3.9* 5.7 10.1 6.6  HGB 13.2 12.7* 12.1* 12.4* 11.7*  HCT 41.3 40.2 38.2* 39.1 36.5*  MCV 95.4 93.9 95.3 96.3 95.8  PLT 147* 161 157 185 168     Coagulation Studies: No results for  input(s): LABPROT, INR in the last 72 hours.  Imaging No new brain imaging overnight.  ASSESSMENT AND PLAN: 54 year old male with past medical history of alcohol use, HIV, hypertension who was found outside a convenience store. EMS found him in PEA arrest with unknown downtime and required 2 rounds of CPR with ROSC. He was noted to have myoclonic seizures on arrival and was started on Keppra and Versed.  Cardiac arrest Suspected anoxic/hypoxic brain injury Myoclonic status epilepticus(resolved) - Opens eyes spontaneously today when patient was off sedation which is a slight improvement from yesterday.  Recommendation -ContinueKeppra to 1500 mg twice daily -Patient had myoclonic seizures in the beginning. However we were able to control them with Versed and Keppra. Currently EEG shows generalized low amplitude slowing consistent with severe diffuse encephalopathy. MRI brain also did not show signs of severe anoxic/hypoxic brain injury. -Given patient's exam, EEG findings and MRI  findings, it appears that patient might be able to continue to have neurologic recovery. Goal is to continue to minimize sedation for neuro prognostication -We will wait for 24 to 48 hours after sedationhas been turned off and continue with daily neurologic exams for prognosis -I spoke with patient's daughter Ms. Salami again at bedside today.    Discussed the slight improvement in mental status.  However, it will be difficult to accurately prognosticate until patient is off of neuro sedation for 24 to 48 hours.  We also discussed that it might be helpful to have a family meeting sometime early next week to discuss goals of care if patient continues to be in a similar state. -Neuroprotective measures including normoglycemia, normothermia, normonatremia -Seizure precautions -Management of rest of comorbidities per primary team  I have spent a total of 25  minuteswith the patient reviewing hospitalnotes,   test results, labs and examining the patient as well as establishing an assessment and plan that was discussed personally with the patient's daughter, RN and physician Dr. Kendrick Fries.>50% of time was spent in direct patient care.   Lindie Spruce Epilepsy [facility] For questions after 5pm please refer to AMION to reach the Neurologist on call

## 2019-11-08 NOTE — Progress Notes (Signed)
[name], MRN:  [id number], DOB:  Jun 28, 1965, LOS: 7 ADMISSION DATE:  11/01/2019, CONSULTATION DATE:  9/29 REFERRING MD:  EDP, CHIEF COMPLAINT:  Cardiac arrest    Brief History   54yo male with hx HIV (last CD4 count 191 on 10/12/19), ETOH with recent ER visit 9/27 after being picked up by police intoxicated. Returned 9/29 after being found down outside convenience store.  Pulseless on EMS arrival with PEA.  Unknown downtime prior to EMS arrival. EMS performed 2 rounds of CPR with 2 epi before ROSC.  Admitted to ICU.   In ER lactate 4.4 post CPR, troponin 450, electrolytes wnl, ETOH 177  Past Medical History  ETOH Abuse HIV - dx 1995  Significant Hospital Events   9/29 Admit post PEA arrest.  UDS positive for THC, benzo's.  ETOH 177 10/05 EEG ongoing. Versed restarted overnight due to agitation. Nicardipine increased.   Consults:  Neuro  Procedures:  ETT 9/29 >>  Significant Diagnostic Tests:  CT head 9/29 >  No evidence of acute intracranial abnormality. Moderate generalized cerebral atrophy, advanced for age. Chronic medially displaced fracture deformity of the right lamina papyracea. Mild ethmoid and maxillary sinus mucosal thickening.  CT cervical spine 9/29 > No evidence of acute fracture to the cervical spine. Partially imaged airspace disease within the imaged lung apices (extensive on the left). Clinical correlation is recommended. 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  EEG 9/29 > Patient was noted to have frequent episodes of axial jerking with eye opening. Concomitant EEG showed generalized polyspikes consistent with myoclonic seizures. EEG also showed continuous generalized background suppression. EEG was not reactive to tactile stimulation. Hyperventilation and photic stimulation were not performed  ECHO 9/30 > No evidence of wall motion abnormality  MRI 10/3 > There is mild  DWI hyperintensity involving the caudate and possibly putamen bilaterally. Additionally, possible subtle FLAIR hyperintensity involving the cerebellum, cortex diffusely, and bilateral basal ganglia. Although not diagnostic, these findings could be seen with early hypoxic/ischemic brain injury in this patient status post PEA arrest.   Micro Data:  BCx2 9/29 >>  Urine 9/29 >> UA few bacteria, WBC, RBC, CaOxalate, and Mucus Covid, Flu A/B 9/29 >> negative  Antimicrobials:  Vancomycin 9/29 >> 9/30 Cefepime 9/29 >> 9/30 Cefepime 10/5 >>  Interim history/subjective:  Tmax 100.4 / WBC 6.6 Glucose range 95-130 I/O 4.8L UOP, +770ml in last 24 hours  RN reports daughter now listed in chart.  Reportedly his brother visited and the next day they found him deceased.  On propofol, cardene gtt's   Objective   Blood pressure 134/78, pulse 86, temperature (!) 100.4 F (38 C), resp. rate 20, height 5\' 6"  (1.676 m), weight 80.7 kg, SpO2 100 %.    Vent Mode: PRVC FiO2 (%):  [30 %] 30 % Set Rate:  [20 bmp] 20 bmp Vt Set:  [510 mL] 510 mL PEEP:  [5 cmH20] 5 cmH20 Plateau Pressure:  [15 cmH20-19 cmH20] 17 cmH20   Intake/Output Summary (Last 24 hours) at 11/08/2019 0839 Last data filed at 11/08/2019 0748 Gross per 24 hour  Intake 5416.11 ml  Output 4545 ml  Net 871.11 ml   Filed Weights   11/06/19 0312 11/07/19 0318 11/08/19 0231  Weight: 82.6 kg 82.1 kg 80.7 kg    Examination:  General: adult male lying in bed in NAD HEENT: MM pink/moist, ETT, pupils 65mm  Neuro: propofol paused for exam - +cough  with deep suction only, slight withdrawal on lowers, internal rotation of UE's with name being called, no blink to threat CV: s1s2 RRR, no m/r/g PULM: non-labored on vent, lungs bilaterally coarse  GI: soft, bsx4 active  Extremities: warm/dry, no edema, thick toenails  Skin: no rashes or lesions  Resolved Hospital Problem list     Assessment & Plan:   Myoclonic Seizures and AMS  Post arrest  myoclonuic seizures, started on Keppra and Versed for burst suppression > then transitioned to propofol to allow for neuro assessment  -appreciate Neurology input  -follow frequent neuro exams  -minimize sedating medications to allow for neuro prognostication  -hold further benzo's -continue keppra  -consider MRI   Cardiac Arrest Hypertension PEA on EMS arrival. No bystander CPR, unknown downtime.  2 rounds CPR/epi with EMS prior to ROSC. EKG normal sinus rhythm with peaked T waves, K 4.2, Echo normal without wall abnormality. Arctic sun (normothermia) stopped 10/3.  -continue amlodipine -add lopressor 12.5mg  BID  -wean nicardipine to off  -SBP goal <160 -PRN labetalol   Acute Hypoxic Respiratory Failure Resp failure requiring airway protection intubation due to post-arrest myoclonic seizures, LUL infiltrates noted on CT and CXR in setting of ETOH hx and Hx of HIV. Spiked fever overnight and WBC increase. Increased ETT secretions. Rule out pneumnia. Also peripheral edema, Volume up on exam. -PRVC 8cc/kg, lung protective ventilation  -daily SBT / WUA as able  -follow intermittent CXR -continue empiric cefepime > initiated due to increased secretions, note negative tracheal aspirate  -follow tracheal aspirate -additional 40 mg IV lasix + KCL  Hx ETOH abuse  -continue thiamine, folate  -sedation as above   Hx HIV Reportedly not taking meds.  CD4 124 -hold home medications for now, consider restart pending neuro prognostication    Nutrition/GI -TF per Nutrition  -miralax  Best practice:  Diet: TF Pain/Anxiety/Delirium protocol (if indicated): PAD protocol  VAP protocol (if indicated): ordered DVT prophylaxis: SQ heparin  GI prophylaxis: PPI Glucose control: n/a Mobility: BR Code Status: full code Family Communication: Daughter updated 10/6 via phone Thurmond Butts [phone] Disposition: ICU  Labs   CBC: Recent Labs  Lab 11/04/19 0403 11/05/19 0330 11/06/19 0350  11/07/19 0320 11/08/19 0302  WBC 4.3 3.9* 5.7 10.1 6.6  HGB 13.2 12.7* 12.1* 12.4* 11.7*  HCT 41.3 40.2 38.2* 39.1 36.5*  MCV 95.4 93.9 95.3 96.3 95.8  PLT 147* 161 157 185 168    Basic Metabolic Panel: Recent Labs  Lab 11/02/19 0036 11/02/19 0505 11/04/19 0403 11/05/19 0330 11/06/19 0350 11/07/19 0320 11/08/19 0302  NA 139   < > 135 136 136 139 138  K 3.3*   < > 3.9 3.6 3.5 3.8 3.7  CL 102   < > 104 104 106 106 104  CO2 20*   < > 23 22 26 23 25   GLUCOSE 147*   < > 116* 105* 130* 125* 118*  BUN 12   < > 7 8 12 11 12   CREATININE 0.85   < > 0.57* 0.56* 0.65 0.63 0.54*  CALCIUM 8.3*   < > 8.3* 8.3* 8.3* 8.8* 8.9  MG 1.4*  --   --   --   --  1.8 2.2  PHOS 3.0  --   --   --   --   --   --    < > = values in this interval not displayed.   GFR: Estimated Creatinine Clearance: 105.4 mL/min (A) (by C-G formula based on  SCr of 0.54 mg/dL (L)). Recent Labs  Lab 11/01/19 1720 11/01/19 1915 11/01/19 2238 11/01/19 2238 11/02/19 0036 11/03/19 0311 11/05/19 0330 11/06/19 0350 11/07/19 0320 11/08/19 0302  PROCALCITON  --   --  1.04  --   --   --   --   --   --  <0.10  WBC   < >  --  7.6   < > 6.8   < > 3.9* 5.7 10.1 6.6  LATICACIDVEN  --  4.4* 3.2*  --  2.4*  --   --   --   --   --    < > = values in this interval not displayed.    Liver Function Tests: Recent Labs  Lab 11/01/19 1915 11/06/19 0350 11/07/19 0320 11/08/19 0302  AST 177* 47* 42* 33  ALT 63* 27 26 22   ALKPHOS 132* 76 83 77  BILITOT 1.0 0.6 0.4 0.6  PROT 7.1 5.8* 6.8 6.3*  ALBUMIN 3.3* 2.5* 2.8* 2.6*   Recent Labs  Lab 11/01/19 1915  LIPASE 48   No results for input(s): AMMONIA in the last 168 hours.  ABG    Component Value Date/Time   PHART 7.408 11/02/2019 0505   PCO2ART 38.7 11/02/2019 0505   PO2ART 103 11/02/2019 0505   HCO3 24.4 11/02/2019 0505   TCO2 26 11/02/2019 0505   ACIDBASEDEF 7.0 (H) 11/01/2019 2118   O2SAT 98.0 11/02/2019 0505     Coagulation Profile: Recent Labs  Lab  11/01/19 1915 11/02/19 0036 11/02/19 0756  INR 1.1 1.0 1.0    Cardiac Enzymes: No results for input(s): CKTOTAL, CKMB, CKMBINDEX, TROPONINI in the last 168 hours.  HbA1C: No results found for: HGBA1C  CBG: Recent Labs  Lab 11/07/19 1529 11/07/19 1936 11/07/19 2338 11/08/19 0332 11/08/19 0747  GLUCAP 111* 127* 111* 108* 95   CC time: 37 minutes  01/08/20, MSN, NP-C, AGACNP-BC [facility] 11/08/2019, 9:19 AM   Please see [email or web address] for pager details.

## 2019-11-08 NOTE — Progress Notes (Signed)
Nutrition Follow Up  DOCUMENTATION CODES:   Not applicable  INTERVENTION:   Continue tube feeding:  -Vital AF 1.2 @ 65 ml/hr via OG (1560 ml) -45 ml ProSource BID  Provides: 1952 kcals, 139 grams protein, 1265 ml free water.   NUTRITION DIAGNOSIS:   Increased nutrient needs related to acute illness as evidenced by estimated needs.  Ongoing  GOAL:   Patient will meet greater than or equal to 90% of their needs   Addressed via TF  MONITOR:   Vent status, Skin, TF tolerance, Weight trends, Labs, I & O's  REASON FOR ASSESSMENT:   Ventilator, Consult Enteral/tube feeding initiation and management  ASSESSMENT:   Patient with PMH significant for HIV and ETOH use. Presents this admission with cardiac arrest and suspected aspiration in setting of heavy ETOH use.   Pt discussed during ICU rounds and with RN.   On propofol to allow for neuro assessment. May need MRI. Daily SBT. Tolerating tube feeding at goal. Adjust rate to better meet needs.   Admission weight: 78.9 kg  Current weight: 80.7 kg   Patient remains intubated on ventilator support MV: 9.5 L/min Temp (24hrs), Avg:100.2 F (37.9 C), Min:99.7 F (37.6 C), Max:100.9 F (38.3 C)   UOP: 4810 ml x 24 hrs  Stool: 50 ml x 24 hrs   Drips: propofol (being weaned) Medications: folic acid, thiamine  Labs: CBG 95-127  NUTRITION FOCUSED PHYSICAL EXAM    Most Recent Value  Orbital Region No depletion  Upper Arm Region No depletion  Thoracic and Lumbar Region Unable to assess  Buccal Region No depletion  Temple Region No depletion  Clavicle Bone Region No depletion  Clavicle and Acromion Bone Region No depletion  Scapular Bone Region Unable to assess  Dorsal Hand No depletion  Patellar Region No depletion  Anterior Thigh Region No depletion  Posterior Calf Region No depletion  Edema (RD Assessment) None  Hair Reviewed  Eyes Reviewed  Mouth Unable to assess  Skin Unable to assess  Nails Reviewed      Diet Order:   Diet Order            Diet NPO time specified  Diet effective now                 EDUCATION NEEDS:   Not appropriate for education at this time  Skin:  Skin Assessment: Reviewed RN Assessment  Last BM:  10/6 via rectal tube  Height:   Ht Readings from Last 1 Encounters:  11/01/19 5\' 6"  (1.676 m)    Weight:   Wt Readings from Last 1 Encounters:  11/08/19 80.7 kg    BMI:  Body mass index is 28.72 kg/m.  Estimated Nutritional Needs:   Kcal:  1924 kcal  Protein:  115-130 grams  Fluid:  >/= 1.9 L/day   01/08/20 RD, LDN Clinical Nutrition Pager listed in AMION

## 2019-11-08 NOTE — Progress Notes (Signed)
Met with daughter at bedside.  Reviewed vent needs, sedation, concern for neuro recovery and that we have to wait to see him off sedation to assess potential for neuro recovery.  Offered support, she is tearful discussing the loss of her Uncle who had just visited the patient this weekend.  Will follow.    Noe Gens, MSN, NP-C, AGACNP-BC [facility] 11/08/2019, 4:10 PM   Please see [email or web address] for pager details.

## 2019-11-09 ENCOUNTER — Inpatient Hospital Stay (HOSPITAL_COMMUNITY): Payer: Medicaid Other

## 2019-11-09 LAB — CBC
HCT: 38.6 % — ABNORMAL LOW (ref 39.0–52.0)
Hemoglobin: 12.3 g/dL — ABNORMAL LOW (ref 13.0–17.0)
MCH: 30.8 pg (ref 26.0–34.0)
MCHC: 31.9 g/dL (ref 30.0–36.0)
MCV: 96.5 fL (ref 80.0–100.0)
Platelets: 229 10*3/uL (ref 150–400)
RBC: 4 MIL/uL — ABNORMAL LOW (ref 4.22–5.81)
RDW: 14.4 % (ref 11.5–15.5)
WBC: 7.2 10*3/uL (ref 4.0–10.5)
nRBC: 0 % (ref 0.0–0.2)

## 2019-11-09 LAB — GLUCOSE, CAPILLARY
Glucose-Capillary: 111 mg/dL — ABNORMAL HIGH (ref 70–99)
Glucose-Capillary: 112 mg/dL — ABNORMAL HIGH (ref 70–99)
Glucose-Capillary: 117 mg/dL — ABNORMAL HIGH (ref 70–99)
Glucose-Capillary: 117 mg/dL — ABNORMAL HIGH (ref 70–99)
Glucose-Capillary: 132 mg/dL — ABNORMAL HIGH (ref 70–99)
Glucose-Capillary: 135 mg/dL — ABNORMAL HIGH (ref 70–99)

## 2019-11-09 LAB — CULTURE, RESPIRATORY W GRAM STAIN: Culture: NORMAL

## 2019-11-09 LAB — BASIC METABOLIC PANEL
Anion gap: 11 (ref 5–15)
BUN: 18 mg/dL (ref 6–20)
CO2: 24 mmol/L (ref 22–32)
Calcium: 9.1 mg/dL (ref 8.9–10.3)
Chloride: 107 mmol/L (ref 98–111)
Creatinine, Ser: 0.6 mg/dL — ABNORMAL LOW (ref 0.61–1.24)
GFR calc non Af Amer: >60 mL/min (ref >60)
Glucose, Bld: 138 mg/dL — ABNORMAL HIGH (ref 70–99)
Potassium: 3.5 mmol/L (ref 3.5–5.1)
Sodium: 142 mmol/L (ref 135–145)

## 2019-11-09 LAB — PROCALCITONIN: Procalcitonin: 0.11 ng/mL

## 2019-11-09 MED ORDER — POTASSIUM CHLORIDE 20 MEQ/15ML (10%) PO SOLN
40.0000 meq | Freq: Once | ORAL | Status: AC
  Administered 2019-11-09: 40 meq
  Filled 2019-11-09: qty 30

## 2019-11-09 MED ORDER — ONDANSETRON HCL 4 MG/2ML IJ SOLN
4.0000 mg | Freq: Four times a day (QID) | INTRAMUSCULAR | Status: DC | PRN
  Administered 2019-11-09: 4 mg via INTRAVENOUS

## 2019-11-09 MED ORDER — LISINOPRIL 10 MG PO TABS
5.0000 mg | ORAL_TABLET | Freq: Every day | ORAL | Status: DC
  Administered 2019-11-09 – 2019-11-10 (×2): 5 mg
  Filled 2019-11-09 (×2): qty 1

## 2019-11-09 MED ORDER — FUROSEMIDE 10 MG/ML IJ SOLN
40.0000 mg | Freq: Once | INTRAMUSCULAR | Status: AC
  Administered 2019-11-09: 40 mg via INTRAVENOUS
  Filled 2019-11-09: qty 4

## 2019-11-09 MED ORDER — METOPROLOL TARTRATE 25 MG/10 ML ORAL SUSPENSION
25.0000 mg | Freq: Two times a day (BID) | ORAL | Status: DC
  Administered 2019-11-09 – 2019-11-13 (×7): 25 mg
  Filled 2019-11-09 (×8): qty 10

## 2019-11-09 MED ORDER — ONDANSETRON HCL 4 MG/2ML IJ SOLN
INTRAMUSCULAR | Status: AC
  Filled 2019-11-09: qty 2

## 2019-11-09 NOTE — Progress Notes (Signed)
Notified by RN of patient vomiting.    P: Hold TF.   Assess KUB.   Zofran 4 mg IV Q6 PRN    Canary Brim, MSN, NP-C, AGACNP-BC [facility] 11/09/2019, 3:24 PM   Please see [email or web address] for pager details.

## 2019-11-09 NOTE — Progress Notes (Signed)
Ryan Orozco, MRN:  [id number], DOB:  [birth year], LOS: 8 ADMISSION DATE:  11/01/2019, CONSULTATION DATE:  9/29 REFERRING MD:  EDP, CHIEF COMPLAINT:  Cardiac arrest    Brief History   54yo male with hx HIV (last CD4 count 191 on 10/12/19), ETOH with recent ER visit 9/27 after being picked up by police intoxicated. Returned 9/29 after being found down outside convenience store.  Pulseless on EMS arrival with PEA.  Unknown downtime prior to EMS arrival. EMS performed 2 rounds of CPR with 2 epi before ROSC.  Admitted to ICU.   In ER lactate 4.4 post CPR, troponin 450, electrolytes wnl, ETOH 177  Past Medical History  ETOH Abuse HIV - dx 1995  Significant Hospital Events   9/29 Admit post PEA arrest.  UDS positive for THC, benzo's.  ETOH 177 10/05 EEG ongoing. Versed restarted overnight due to agitation. Nicardipine increased.  10/06 Developed tachypnea with WUA, no follow commands off sedation   Consults:  Neuro  Procedures:  ETT 9/29 >>  Significant Diagnostic Tests:  CT head 9/29 >  No evidence of acute intracranial abnormality. Moderate generalized cerebral atrophy, advanced for age. Chronic medially displaced fracture deformity of the right lamina papyracea. Mild ethmoid and maxillary sinus mucosal thickening.  CT cervical spine 9/29 > No evidence of acute fracture to the cervical spine. Partially imaged airspace disease within the imaged lung apices (extensive on the left). Clinical correlation is recommended. 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  EEG 9/29 > Patient was noted to have frequent episodes of axial jerking with eye opening. Concomitant EEG showed generalized polyspikes consistent with myoclonic seizures. EEG also showed continuous generalized background suppression. EEG was not reactive to tactile stimulation. Hyperventilation and photic stimulation were not performed  ECHO 9/30 >  No evidence of wall motion abnormality  MRI 10/3 > Mild DWI hyperintensity involving the caudate and possibly putamen bilaterally. Additionally, possible subtle FLAIR hyperintensity involving the cerebellum, cortex diffusely, and bilateral basal ganglia. Although not diagnostic, these findings could be seen with early hypoxic/ischemic brain injury in this patient status post PEA arrest.   Micro Data:  BCx2 9/29 >> negative  Urine 9/29 >> UA few bacteria, WBC, RBC, CaOxalate, and Mucus Covid, Flu A/B 9/29 >> negative Tracheal aspirate 10/5 >>   Antimicrobials:  Vancomycin 9/29 >> 9/30 Cefepime 9/29 >> 9/30 Cefepime 10/5 >>  Interim history/subjective:  Tmax 100.4 / WBC 7.2  Vent - 40%, PEEP 5 Glucose range 106-138  I/O 2.9L UOP, 2.1L + in last 24 hours  RN reports overnight concern for possible bile colored secretions from mouth, TF held with 300 ml residual.  RT reports pt biting ETT, unable to move ETT from side to side due to clenching   Objective   Blood pressure 123/77, pulse 87, temperature (!) 100.4 F (38 C), resp. rate 20, height 5\' 6"  (1.676 m), weight 83 kg, SpO2 100 %.    Vent Mode: PRVC FiO2 (%):  [30 %] 30 % Set Rate:  [20 bmp] 20 bmp Vt Set:  [510 mL] 510 mL PEEP:  [5 cmH20] 5 cmH20 Plateau Pressure:  [16 cmH20-23 cmH20] 16 cmH20   Intake/Output Summary (Last 24 hours) at 11/09/2019 0910 Last data filed at 11/09/2019 0900 Gross per 24 hour  Intake 5220.4 ml  Output 2870 ml  Net 2350.4 ml   Filed Weights   11/07/19 0318 11/08/19 0231 11/09/19 0329  Weight: 82.1  kg 80.7 kg 83 kg    Examination:  General: adult male patient, critically ill appearing, lying in bed on vent in NAD   HEENT: MM pink/moist, ETT, pupils 79mm, arcus senilis ring noted Neuro: sedate, opens eyes with stimulation, does not track, no blink to threat, minimal withdrawal with painful stimulation  CV: s1s2 RRR, no m/r/g PULM: non-labored on vent, lungs bilaterally coarse, oral secretions  thin/clear, clear to creamy secretions from in line ballard with suction GI: soft, bsx4 active  Extremities: warm/dry, no edema  Skin: no rashes or lesions  PCXR 10/7 >> images personally reviewed, NGT/ETT in good position, mild bibasilar atelectasis   Resolved Hospital Problem list     Assessment & Plan:   Myoclonic Seizures and AMS  Post arrest myoclonuic seizures, started on Keppra and Versed for burst suppression > then transitioned to propofol to allow for neuro assessment  -appreciate Neurology > goal is give sedation holiday to allow for neuro exam  -follow frequent neuro exam -minimize all sedation to allow for neuro prognostication  -continue keppra -consider repeat MRI to evaluate for evolution of prior early concerning findings  Cardiac Arrest Hypertension PEA on EMS arrival. No bystander CPR, unknown downtime.  2 rounds CPR/epi with EMS prior to ROSC. EKG normal sinus rhythm with peaked T waves, K 4.2, Echo normal without wall abnormality. Arctic sun (normothermia) stopped 10/3.  -amlodipine  -increase lopressor to 25 mg BID  -wean nicardipine to off  -SBP goal <160 -PRN labetalol  -lasix 40 mg IV x1 with KCL  Acute Hypoxic Respiratory Failure Resp failure requiring airway protection intubation due to post-arrest myoclonic seizures, LUL infiltrates noted on CT and CXR in setting of ETOH hx and Hx of HIV. Spiked fever overnight and WBC increase. Increased ETT secretions. Rule out pneumnia. Also peripheral edema, Volume up on exam. -PRVC 8cc/kg  -daily SBT / WUA, mental status remains barrier to extubation  -follow intermittent CXR  -continue empiric cefepime, D3/x  -follow tracheal aspirate   Hx ETOH abuse  -folate, thiamine  -sedation as above   Hx HIV Reportedly not taking meds.  CD4 124 -hold home medications for now, consider restart pending neuro prognostication (pt was not taking at home)  Nutrition/GI -TF per Nutrition  -continue miralax  Best  practice:  Diet: TF Pain/Anxiety/Delirium protocol (if indicated): PAD protocol  VAP protocol (if indicated): ordered DVT prophylaxis: SQ heparin  GI prophylaxis: PPI Glucose control: n/a Mobility: BR Code Status: full code Family Communication: Daughter updated 10/7 via phone Ryan Orozco [phone] Disposition: ICU  Labs   CBC: Recent Labs  Lab 11/05/19 0330 11/06/19 0350 11/07/19 0320 11/08/19 0302 11/09/19 0354  WBC 3.9* 5.7 10.1 6.6 7.2  HGB 12.7* 12.1* 12.4* 11.7* 12.3*  HCT 40.2 38.2* 39.1 36.5* 38.6*  MCV 93.9 95.3 96.3 95.8 96.5  PLT 161 157 185 168 229    Basic Metabolic Panel: Recent Labs  Lab 11/05/19 0330 11/06/19 0350 11/07/19 0320 11/08/19 0302 11/09/19 0354  NA 136 136 139 138 142  K 3.6 3.5 3.8 3.7 3.5  CL 104 106 106 104 107  CO2 22 26 23 25 24   GLUCOSE 105* 130* 125* 118* 138*  BUN 8 12 11 12 18   CREATININE 0.56* 0.65 0.63 0.54* 0.60*  CALCIUM 8.3* 8.3* 8.8* 8.9 9.1  MG  --   --  1.8 2.2  --    GFR: Estimated Creatinine Clearance: 106.8 mL/min (A) (by C-G formula based on SCr of 0.6 mg/dL (L)). Recent  Labs  Lab 11/06/19 0350 11/07/19 0320 11/08/19 0302 11/09/19 0354  PROCALCITON  --   --  <0.10 0.11  WBC 5.7 10.1 6.6 7.2    Liver Function Tests: Recent Labs  Lab 11/06/19 0350 11/07/19 0320 11/08/19 0302  AST 47* 42* 33  ALT 27 26 22   ALKPHOS 76 83 77  BILITOT 0.6 0.4 0.6  PROT 5.8* 6.8 6.3*  ALBUMIN 2.5* 2.8* 2.6*   No results for input(s): LIPASE, AMYLASE in the last 168 hours. No results for input(s): AMMONIA in the last 168 hours.  ABG    Component Value Date/Time   PHART 7.408 11/02/2019 0505   PCO2ART 38.7 11/02/2019 0505   PO2ART 103 11/02/2019 0505   HCO3 24.4 11/02/2019 0505   TCO2 26 11/02/2019 0505   ACIDBASEDEF 7.0 (H) 11/01/2019 2118   O2SAT 98.0 11/02/2019 0505     Coagulation Profile: No results for input(s): INR, PROTIME in the last 168 hours.  Cardiac Enzymes: No results for input(s):  CKTOTAL, CKMB, CKMBINDEX, TROPONINI in the last 168 hours.  HbA1C: No results found for: HGBA1C  CBG: Recent Labs  Lab 11/08/19 1531 11/08/19 1918 11/08/19 2315 11/09/19 0307 11/09/19 0730  GLUCAP 102* 114* 94 117* 135*   CC time: 34 minutes  01/09/20, MSN, NP-C, AGACNP-BC [facility] 11/09/2019, 9:10 AM   Please see [email or web address] for pager details.

## 2019-11-09 NOTE — TOC Initial Note (Signed)
Transition of Care [facility]) - Initial/Assessment Note    Patient Details  Name: Ryan Orozco MRN: [id number] Date of Birth: 07-21-1965  Transition of [facility]) CM/SW Contact:    Lockie Pares, RN Phone Number: 11/09/2019, 2:45 PM  Clinical Narrative:                 Patient with intoxication , picked up by police, on 9.27 HIV with CD4 count 191 on 9/9 presented on 9/29 after being found pulseless at a convenience store. EMS stated PEA , Pos COVID. MRI shows potential hypoxic brain injury, may need to repeat, weaned down sedation, no purposeful movement. Myclonus. Unkown what outcome will be , patient has no health insurance, but FC looking into patient.  Cannot approve for LTAC/SNF without insurance.  CM will continue to follow for needs  Expected Discharge Plan: Long Term Acute Care (LTAC) Barriers to Discharge: Continued Medical Work up   Patient Goals and CMS Choice        Expected Discharge Plan and Services Expected Discharge Plan: Long Term Acute Care (LTAC)   Discharge Planning Services: CM Consult   Living arrangements for the past 2 months: Single Family Home                                      Prior Living Arrangements/Services Living arrangements for the past 2 months: Single Family Home   Patient language and need for interpreter reviewed:: Yes        Need for Family Participation in Patient Care: Yes (Comment) Care giver support system in place?: Yes (comment)   Criminal Activity/Legal Involvement Pertinent to Current Situation/Hospitalization: No - Comment as needed  Activities of Daily Living      Permission Sought/Granted                  Emotional Assessment   Attitude/Demeanor/Rapport: Intubated (Following Commands or Not Following Commands) Affect (typically observed): Unable to Assess Orientation: :  (sedated on vent no purposeful movement) Alcohol / Substance Use: Not Applicable Psych Involvement: No  (comment)  Admission diagnosis:  Cardiac arrest (HCC) [I46.9] Alcohol abuse [F10.10] Metabolic acidosis [E87.2] Community acquired pneumonia of left upper lobe of lung [J18.9] HIV infection, unspecified symptom status (HCC) [B20] Patient Active Problem List   Diagnosis Date Noted  . Acute respiratory failure with hypoxia (HCC)   . Community acquired pneumonia of left upper lobe of lung   . Hypoxic encephalopathy (HCC)   . Alcohol abuse   . Cardiac arrest (HCC) 11/01/2019  . HIV (human immunodeficiency virus infection) (HCC) 02/18/2018  . Healthcare maintenance 02/18/2018  . Hypertension 02/18/2018   PCP:  Default, Provider, MD Pharmacy:   [facility] [phone] - [place], [place] - [address] AT [facility] OF [address] [place] [address] Phone: [phone] Fax: [phone]  [id number] - [place], [place] - [address] AT [place] [address] [place] [address] Phone: [phone] Fax: [phone]  [id number] [place], [place] - [address] AT [facility] [address] [place] [address] Phone: [phone] Fax: [phone]     Social Determinants of Health (SDOH) Interventions    Readmission Risk Interventions No flowsheet data found.

## 2019-11-09 NOTE — Progress Notes (Signed)
K+ 3.5  Replaced per protocol  

## 2019-11-09 NOTE — Progress Notes (Signed)
Noticed what looked like bile/tube feeds coming from patients mouth. Residuals checked and found to be >300. Returned residuals. Tube feeds stopped. CXR from this morning reports that OGT is stable and in place. Will continue to monitor patient and report this to the oncoming nurse.

## 2019-11-10 ENCOUNTER — Inpatient Hospital Stay (HOSPITAL_COMMUNITY): Payer: Medicaid Other

## 2019-11-10 DIAGNOSIS — R1110 Vomiting, unspecified: Secondary | ICD-10-CM | POA: Diagnosis present

## 2019-11-10 DIAGNOSIS — B20 Human immunodeficiency virus [HIV] disease: Secondary | ICD-10-CM

## 2019-11-10 LAB — COMPREHENSIVE METABOLIC PANEL
ALT: 19 U/L (ref 0–44)
AST: 32 U/L (ref 15–41)
Albumin: 2.7 g/dL — ABNORMAL LOW (ref 3.5–5.0)
Alkaline Phosphatase: 77 U/L (ref 38–126)
Anion gap: 12 (ref 5–15)
BUN: 19 mg/dL (ref 6–20)
CO2: 24 mmol/L (ref 22–32)
Calcium: 10 mg/dL (ref 8.9–10.3)
Chloride: 105 mmol/L (ref 98–111)
Creatinine, Ser: 0.59 mg/dL — ABNORMAL LOW (ref 0.61–1.24)
GFR calc non Af Amer: >60 mL/min (ref >60)
Glucose, Bld: 115 mg/dL — ABNORMAL HIGH (ref 70–99)
Potassium: 3.5 mmol/L (ref 3.5–5.1)
Sodium: 141 mmol/L (ref 135–145)
Total Bilirubin: 0.8 mg/dL (ref 0.3–1.2)
Total Protein: 6.9 g/dL (ref 6.5–8.1)

## 2019-11-10 LAB — GLUCOSE, CAPILLARY
Glucose-Capillary: 102 mg/dL — ABNORMAL HIGH (ref 70–99)
Glucose-Capillary: 102 mg/dL — ABNORMAL HIGH (ref 70–99)
Glucose-Capillary: 103 mg/dL — ABNORMAL HIGH (ref 70–99)
Glucose-Capillary: 79 mg/dL (ref 70–99)
Glucose-Capillary: 88 mg/dL (ref 70–99)
Glucose-Capillary: 97 mg/dL (ref 70–99)

## 2019-11-10 LAB — RAPID URINE DRUG SCREEN, HOSP PERFORMED
Amphetamines: NOT DETECTED
Barbiturates: NOT DETECTED
Benzodiazepines: POSITIVE — AB
Cocaine: NOT DETECTED
Opiates: NOT DETECTED
Tetrahydrocannabinol: NOT DETECTED

## 2019-11-10 LAB — CBC
HCT: 39 % (ref 39.0–52.0)
Hemoglobin: 12.5 g/dL — ABNORMAL LOW (ref 13.0–17.0)
MCH: 30.4 pg (ref 26.0–34.0)
MCHC: 32.1 g/dL (ref 30.0–36.0)
MCV: 94.9 fL (ref 80.0–100.0)
Platelets: 252 10*3/uL (ref 150–400)
RBC: 4.11 MIL/uL — ABNORMAL LOW (ref 4.22–5.81)
RDW: 14.2 % (ref 11.5–15.5)
WBC: 7 10*3/uL (ref 4.0–10.5)
nRBC: 0 % (ref 0.0–0.2)

## 2019-11-10 LAB — MAGNESIUM: Magnesium: 2.3 mg/dL (ref 1.7–2.4)

## 2019-11-10 LAB — TRIGLYCERIDES: Triglycerides: 148 mg/dL (ref <150)

## 2019-11-10 MED ORDER — LISINOPRIL 10 MG PO TABS
10.0000 mg | ORAL_TABLET | Freq: Every day | ORAL | Status: DC
  Administered 2019-11-11 – 2019-11-22 (×10): 10 mg
  Filled 2019-11-10 (×10): qty 1

## 2019-11-10 MED ORDER — LISINOPRIL 10 MG PO TABS
5.0000 mg | ORAL_TABLET | Freq: Once | ORAL | Status: AC
  Administered 2019-11-10: 5 mg
  Filled 2019-11-10: qty 1

## 2019-11-10 MED ORDER — FUROSEMIDE 10 MG/ML IJ SOLN
40.0000 mg | Freq: Once | INTRAMUSCULAR | Status: AC
  Administered 2019-11-10: 40 mg via INTRAVENOUS
  Filled 2019-11-10: qty 4

## 2019-11-10 MED ORDER — SODIUM CHLORIDE 0.9 % IV SOLN
250.0000 mg | Freq: Three times a day (TID) | INTRAVENOUS | Status: AC
  Administered 2019-11-10 – 2019-11-11 (×3): 250 mg via INTRAVENOUS
  Filled 2019-11-10 (×3): qty 5

## 2019-11-10 MED ORDER — POTASSIUM CHLORIDE 20 MEQ/15ML (10%) PO SOLN
40.0000 meq | Freq: Once | ORAL | Status: AC
  Administered 2019-11-10: 40 meq
  Filled 2019-11-10: qty 30

## 2019-11-10 MED ORDER — POTASSIUM CHLORIDE 10 MEQ/100ML IV SOLN
10.0000 meq | INTRAVENOUS | Status: AC
  Administered 2019-11-10 (×4): 10 meq via INTRAVENOUS
  Filled 2019-11-10 (×4): qty 100

## 2019-11-10 NOTE — Progress Notes (Signed)
EEG complete - results pending 

## 2019-11-10 NOTE — Procedures (Addendum)
Patient Name:Ryan Orozco BTD:[id number] Epilepsy Attending:Aleynah Rocchio Annabelle Harman Referring Physician/Provider:Brandi Veleta Miners, NP Date: 11/10/2019 Duration:32.28 minutes  Patient history:54 year old male status post cardiac arrest. EEG to evaluate for seizures.  Level of alertness:Awake, sleep/sedated  AEDs during EEG study: LEV, propofol  Technical aspects: This EEG study was done with scalp electrodes positioned according to the 10-20 International system of electrode placement. Electrical activity was acquired at a sampling rate of 500Hz  and reviewed with a high frequency filter of 70Hz  and a low frequency filter of 1Hz . EEG data were recorded continuously and digitally stored.   Description:During awake state, no clear posterior dominant rhythm was seen.  EEGshowed continuous generalizedlow amplitude6 to 9 Hz theta-alpha activity as well as intermittent generalized 2 to 3 Hz delta slowing.  Sharp transients were noted in central parietal region. Patient was noted to have upper left eye twitching as well as "hand quiver" in response to squeezing per EEG technician.  Concomitant EEG before, during and after the event did not show any EEG change to suggest seizure.  Hyperventilation and photic stimulation were not performed.   ABNORMALITY -Continuousslow, generalized  IMPRESSION: This studyissuggestive of severe diffuse encephalopathy, nonspecific etiology but likely related to anoxic/hypoxic brain injury, sedation.Patient was noted to have upper left eye twitching as well as "hand quiver" in respond to squeezing per EEG technician without concomitant EEG change and was most likely not epileptic.  Of note, focal motor seizures may not be seen on scalp EEG.  These events could also be stimulation induced myoclonus.  Therefore clinical correlation is recommended.  [name] 

## 2019-11-10 NOTE — Progress Notes (Signed)
Report from day shift indicated emesis had came up from around ETT, so tube feeds on hold. Abdomen distended. OGT placed on low wall intermittent suction. Large amounts of return noted-1500cc over first 1 hour. Greenish/brown in color, drainage continued, OGT left on LWS/intermittent. Tube feeds remain on hold. Will monitor and report in am for CCM to evaluate.

## 2019-11-10 NOTE — Progress Notes (Signed)
Subjective: No acute events overnight.  Was noted to have some tremor-like movements and therefore repeat EEG was ordered.  ROS: Unable to obtain due to poor mental status  Examination  Vital signs in last 24 hours: Temp:  [98.4 F (36.9 C)-100.8 F (38.2 C)] 99.4 F (37.4 C) (10/08 1100) Pulse Rate:  [55-89] 75 (10/08 1400) Resp:  [19-23] 20 (10/08 1400) BP: (113-166)/(74-97) 143/92 (10/08 1400) SpO2:  [98 %-100 %] 100 % (10/08 1400) Arterial Line BP: (112-181)/(62-92) 143/78 (10/08 1400) FiO2 (%):  [30 %] 30 % (10/08 1200) Weight:  [83 kg] 83 kg (10/08 0200)  General: lying in bed,not in apparent distress CVS: pulse-normal rate and rhythm RS: breathing comfortably,intubated Extremities: normal,warm Neuro:Opens eyes on noxious stimulation, does not look at the examiner, does not follow commands, pupils equally round and reactive, corneal reflex intact, gag reflex intact, withdraw to noxious stimuli in all 4 extremities.  Had myoclonic-like movements in right upper extremity on stimulation.  Basic Metabolic Panel: Recent Labs  Lab 11/06/19 0350 11/06/19 0350 11/07/19 0320 11/07/19 0320 11/08/19 0302 11/09/19 0354 11/10/19 0438  NA 136  --  139  --  138 142 141  K 3.5  --  3.8  --  3.7 3.5 3.5  CL 106  --  106  --  104 107 105  CO2 26  --  23  --  25 24 24   GLUCOSE 130*  --  125*  --  118* 138* 115*  BUN 12  --  11  --  12 18 19   CREATININE 0.65  --  0.63  --  0.54* 0.60* 0.59*  CALCIUM 8.3*   < > 8.8*   < > 8.9 9.1 10.0  MG  --   --  1.8  --  2.2  --  2.3   < > = values in this interval not displayed.    CBC: Recent Labs  Lab 11/06/19 0350 11/07/19 0320 11/08/19 0302 11/09/19 0354 11/10/19 0438  WBC 5.7 10.1 6.6 7.2 7.0  HGB 12.1* 12.4* 11.7* 12.3* 12.5*  HCT 38.2* 39.1 36.5* 38.6* 39.0  MCV 95.3 96.3 95.8 96.5 94.9  PLT 157 185 168 229 252     Coagulation Studies: No results for input(s): LABPROT, INR in the last 72 hours.  Imaging No new brain  imaging overnight.  ASSESSMENT AND PLAN: 54 year old male with past medical history of alcohol use, HIV, hypertension who was found outside a convenience store. EMS found him in PEA arrest with unknown downtime and required 2 rounds of CPR with ROSC. He was noted to have myoclonic seizures on arrival and was started on Keppra and Versed.  Seizures resolved and patient has now been opening eyes but not following commands.  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  Does appear to have stimulation does myoclonus.  Recommendation -ContinueKeppra to 1500 mg twice daily -If myoclonus appears worse, can add valproic acid. -We will consider repeating MRI brain on Monday and discuss further goals of care with daughter next week. -Neuroprotective measures including normoglycemia, normothermia, normonatremia -Seizure precautions -Management of rest of comorbidities per primary team  I have spent a total of56minuteswith the patient reviewing hospitalnotes, test results, labs and examining the patient as well as establishing an assessment and plan that was discussed personally with the Tuesday, NP.>50% of time was spent in direct patient care.    32m Epilepsy [facility]  For questions after 5pm please refer to AMION to reach the Neurologist on call

## 2019-11-10 NOTE — Progress Notes (Signed)
NAMEOla Orozco, MRN:  [id number], DOB:  Jul 30, 1965, LOS: 9 ADMISSION DATE:  11/01/2019, CONSULTATION DATE:  9/29 REFERRING MD:  EDP, CHIEF COMPLAINT:  Cardiac arrest    Brief History   54yo male with hx HIV (last CD4 count 191 on 10/12/19), ETOH with recent ER visit 9/27 after being picked up by police intoxicated. Returned 9/29 after being found down outside convenience store.  Pulseless on EMS arrival with PEA.  Unknown downtime prior to EMS arrival. EMS performed 2 rounds of CPR with 2 epi before ROSC.  Admitted to ICU.   In ER lactate 4.4 post CPR, troponin 450, electrolytes wnl, ETOH 177  Past Medical History  ETOH Abuse HIV - dx 1995  Significant Hospital Events   9/29 Admit post PEA arrest.  UDS positive for THC, benzo's.  ETOH 177 10/05 EEG ongoing. Versed restarted overnight due to agitation. Nicardipine increased.  10/06 Developed tachypnea with WUA, no follow commands off sedation  10/07 Vomiting, TF held / restarted with recurrent vomiting  Consults:  Neuro  Procedures:  ETT 9/29 >>  Significant Diagnostic Tests:  CT head 9/29 >  No evidence of acute intracranial abnormality. Moderate generalized cerebral atrophy, advanced for age. Chronic medially displaced fracture deformity of the right lamina papyracea. Mild ethmoid and maxillary sinus mucosal thickening.  CT cervical spine 9/29 > No evidence of acute fracture to the cervical spine. Partially imaged airspace disease within the imaged lung apices (extensive on the left). Clinical correlation is recommended. 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  EEG 9/29 > Patient was noted to have frequent episodes of axial jerking with eye opening. Concomitant EEG showed generalized polyspikes consistent with myoclonic seizures. EEG also showed continuous generalized background suppression. EEG was not reactive to tactile stimulation.  Hyperventilation and photic stimulation were not performed  ECHO 9/30 > No evidence of wall motion abnormality  MRI 10/3 > Mild DWI hyperintensity involving the caudate and possibly putamen bilaterally. Additionally, possible subtle FLAIR hyperintensity involving the cerebellum, cortex diffusely, and bilateral basal ganglia. Although not diagnostic, these findings could be seen with early hypoxic/ischemic brain injury in this patient status post PEA arrest.   Micro Data:  BCx2 9/29 >> negative  Urine 9/29 >> UA few bacteria, WBC, RBC, CaOxalate, and Mucus Covid, Flu A/B 9/29 >> negative Tracheal aspirate 10/5 >> normal flora   Antimicrobials:  Vancomycin 9/29 >> 9/30 Cefepime 9/29 >> 9/30 Cefepime 10/5 >>   Interim history/subjective:  RN reports vomiting overnight, NGT to LIS Tmax 100.8 / WBC 7  Vent 30% / PEEP 5 Glucose range 88-138 I/O 2L UOP, 2L emesis, stool, -2.5L in last 24 hours   Objective   Blood pressure (!) 154/78, pulse 67, temperature 99.6 F (37.6 C), temperature source Axillary, resp. rate 19, height 5\' 6"  (1.676 m), weight 83 kg, SpO2 99 %.    Vent Mode: PRVC FiO2 (%):  [30 %] 30 % Set Rate:  [20 bmp] 20 bmp Vt Set:  [510 mL] 510 mL PEEP:  [5 cmH20] 5 cmH20 Plateau Pressure:  [15 cmH20-30 cmH20] 17 cmH20   Intake/Output Summary (Last 24 hours) at 11/10/2019 1055 Last data filed at 11/10/2019 1000 Gross per 24 hour  Intake 1428.66 ml  Output 4575 ml  Net -3146.34 ml   Filed Weights   11/08/19 0231 11/09/19 0329 11/10/19 0200  Weight: 80.7 kg 83 kg 83 kg    Examination:  General:  adult male lying in bed, critically ill appearing on vent  HEENT: MM pink/moist, ETT, pupils 28mm =/reactive, arcus senilis noted Neuro: on propofol , no response to verbal stimuli, +gag/cough with suction, +corneal response, minimal withdrawal to painful stimuli, no follow commands, does not blink to threat, rhythmic movement of BUE's and eyes when held in open  position CV: s1s2 rrr, no m/r/g PULM: non-labored on vent, lungs bilaterally coarse rhonchi, creamy secretions from ETT GI: soft, bsx4 active  Extremities: warm/dry, no edema. Thick toenails.  Skin: no rashes or lesions  CXR 10/8 >> ETT in good position, NGT in good position, mild bibasilar airspace disease. Patient does not have central access.   Resolved Hospital Problem list     Assessment & Plan:   Myoclonic Seizures and AMS  Post arrest myoclonuic seizures, started on Keppra and Versed for burst suppression > then transitioned to propofol to allow for neuro assessment. Versed gtt stopped at 7am on 10/5.   -appreciate Neurology input  -minimize sedating medications to allow for neuro exam  -assess UDS for residual benzo's as he was on such high dose for suppression  -continue keppra  -assess EEG with exam findings as above, suspect myoclonus  -discussed with Neurology, will plan for repeat MRI early next week  Cardiac Arrest Hypertension PEA on EMS arrival. No bystander CPR, unknown downtime.  2 rounds CPR/epi with EMS prior to ROSC. EKG normal sinus rhythm with peaked T waves, K 4.2, Echo normal without wall abnormality. Arctic sun (normothermia) stopped 10/3.  -continue amlodipine, lopressor BID -increase lisinopril to 10 mg PT, additional 5mg  10/8 to account for daily increase  -hold lasix 10/8, euvolemic on exam   Acute Hypoxic Respiratory Failure Resp failure requiring airway protection intubation due to post-arrest myoclonic seizures, LUL infiltrates noted on CT and CXR in setting of ETOH hx and Hx of HIV. Spiked fever overnight and WBC increase. Increased ETT secretions. Rule out pneumnia. Initially with peripheral edema / volume up on exam.  Negative tracheal aspirate.  -PRVC 8cc/kg  -daily SBT / WUA, mental status remains barrier to extubation / weaning -UDS as above as was on high dose benzodiazepines  -continue empiric cefepime, D4/7, stop date added    Hypokalemia -follow trend, replace as indicated   Hx ETOH abuse  -thiamine, folate -sedation as above   Hx HIV Reportedly not taking meds.  CD4 124 -hold home medications for now, consider restart pending neuro prognostication (pt was not taking at home)  At Risk Malnutrition  Nausea / Vomiting  KUB negative on 10/7, absence of gas.  Small amt liquid stool on exam 10/8.  -TF per Nutrition, hold for now -NGT to LIS  -consider trickle feeding this evening or am and monitor for tolerance -continue miralax -add short course erythromycin for motility  -pending neuro prognostication, may need cortrak post pyloric    Best practice:  Diet: TF Pain/Anxiety/Delirium protocol (if indicated): PAD protocol  VAP protocol (if indicated): ordered DVT prophylaxis: SQ heparin  GI prophylaxis: PPI Glucose control: n/a Mobility: BR Code Status: full code Family Communication: Daughter updated 10/8 via phone - 12/8 [phone].  Family will be at funeral for patients brother over the weekend.  For contact, may have to leave a message.  Disposition: ICU  Labs   CBC: Recent Labs  Lab 11/06/19 0350 11/07/19 0320 11/08/19 0302 11/09/19 0354 11/10/19 0438  WBC 5.7 10.1 6.6 7.2 7.0  HGB 12.1* 12.4* 11.7* 12.3* 12.5*  HCT 38.2* 39.1 36.5* 38.6* 39.0  MCV 95.3 96.3 95.8 96.5 94.9  PLT 157 185 168 229 252    Basic Metabolic Panel: Recent Labs  Lab 11/06/19 0350 11/07/19 0320 11/08/19 0302 11/09/19 0354 11/10/19 0438  NA 136 139 138 142 141  K 3.5 3.8 3.7 3.5 3.5  CL 106 106 104 107 105  CO2 26 23 25 24 24   GLUCOSE 130* 125* 118* 138* 115*  BUN 12 11 12 18 19   CREATININE 0.65 0.63 0.54* 0.60* 0.59*  CALCIUM 8.3* 8.8* 8.9 9.1 10.0  MG  --  1.8 2.2  --  2.3   GFR: Estimated Creatinine Clearance: 106.8 mL/min (A) (by C-G formula based on SCr of 0.59 mg/dL (L)). Recent Labs  Lab 11/07/19 0320 11/08/19 0302 11/09/19 0354 11/10/19 0438  PROCALCITON  --  <0.10 0.11   --   WBC 10.1 6.6 7.2 7.0    Liver Function Tests: Recent Labs  Lab 11/06/19 0350 11/07/19 0320 11/08/19 0302 11/10/19 0438  AST 47* 42* 33 32  ALT 27 26 22 19   ALKPHOS 76 83 77 77  BILITOT 0.6 0.4 0.6 0.8  PROT 5.8* 6.8 6.3* 6.9  ALBUMIN 2.5* 2.8* 2.6* 2.7*   No results for input(s): LIPASE, AMYLASE in the last 168 hours. No results for input(s): AMMONIA in the last 168 hours.  ABG    Component Value Date/Time   PHART 7.408 11/02/2019 0505   PCO2ART 38.7 11/02/2019 0505   PO2ART 103 11/02/2019 0505   HCO3 24.4 11/02/2019 0505   TCO2 26 11/02/2019 0505   ACIDBASEDEF 7.0 (H) 11/01/2019 2118   O2SAT 98.0 11/02/2019 0505     Coagulation Profile: No results for input(s): INR, PROTIME in the last 168 hours.  Cardiac Enzymes: No results for input(s): CKTOTAL, CKMB, CKMBINDEX, TROPONINI in the last 168 hours.  HbA1C: No results found for: HGBA1C  CBG: Recent Labs  Lab 11/09/19 1546 11/09/19 1911 11/09/19 2338 11/10/19 0315 11/10/19 0722  GLUCAP 117* 112* 111* 88 102*   CC time: 36 minutes  01/09/20, MSN, NP-C, AGACNP-BC [facility] 11/10/2019, 10:55 AM   Please see [email or web address] for pager details.

## 2019-11-10 NOTE — Progress Notes (Signed)
K+ 3.5  Replaced per protocol  

## 2019-11-11 ENCOUNTER — Inpatient Hospital Stay (HOSPITAL_COMMUNITY): Payer: Medicaid Other

## 2019-11-11 LAB — GLUCOSE, CAPILLARY
Glucose-Capillary: 105 mg/dL — ABNORMAL HIGH (ref 70–99)
Glucose-Capillary: 108 mg/dL — ABNORMAL HIGH (ref 70–99)
Glucose-Capillary: 75 mg/dL (ref 70–99)
Glucose-Capillary: 91 mg/dL (ref 70–99)
Glucose-Capillary: 96 mg/dL (ref 70–99)
Glucose-Capillary: 96 mg/dL (ref 70–99)

## 2019-11-11 LAB — COMPREHENSIVE METABOLIC PANEL
ALT: 20 U/L (ref 0–44)
AST: 43 U/L — ABNORMAL HIGH (ref 15–41)
Albumin: 3 g/dL — ABNORMAL LOW (ref 3.5–5.0)
Alkaline Phosphatase: 95 U/L (ref 38–126)
Anion gap: 14 (ref 5–15)
BUN: 25 mg/dL — ABNORMAL HIGH (ref 6–20)
CO2: 26 mmol/L (ref 22–32)
Calcium: 10.6 mg/dL — ABNORMAL HIGH (ref 8.9–10.3)
Chloride: 103 mmol/L (ref 98–111)
Creatinine, Ser: 0.83 mg/dL (ref 0.61–1.24)
GFR, Estimated: >60 mL/min (ref >60)
Glucose, Bld: 100 mg/dL — ABNORMAL HIGH (ref 70–99)
Potassium: 3.5 mmol/L (ref 3.5–5.1)
Sodium: 143 mmol/L (ref 135–145)
Total Bilirubin: 0.7 mg/dL (ref 0.3–1.2)
Total Protein: 7.9 g/dL (ref 6.5–8.1)

## 2019-11-11 LAB — CBC
HCT: 43.4 % (ref 39.0–52.0)
Hemoglobin: 13.5 g/dL (ref 13.0–17.0)
MCH: 30.1 pg (ref 26.0–34.0)
MCHC: 31.1 g/dL (ref 30.0–36.0)
MCV: 96.7 fL (ref 80.0–100.0)
Platelets: 256 10*3/uL (ref 150–400)
RBC: 4.49 MIL/uL (ref 4.22–5.81)
RDW: 14.1 % (ref 11.5–15.5)
WBC: 8.8 10*3/uL (ref 4.0–10.5)
nRBC: 0 % (ref 0.0–0.2)

## 2019-11-11 MED ORDER — POLYETHYLENE GLYCOL 3350 17 G PO PACK
17.0000 g | PACK | Freq: Every day | ORAL | Status: DC
  Administered 2019-11-12 – 2019-12-05 (×15): 17 g
  Filled 2019-11-11 (×14): qty 1

## 2019-11-11 MED ORDER — METOCLOPRAMIDE HCL 5 MG/ML IJ SOLN
5.0000 mg | Freq: Three times a day (TID) | INTRAMUSCULAR | Status: AC
  Administered 2019-11-11 – 2019-11-12 (×3): 5 mg via INTRAVENOUS
  Filled 2019-11-11 (×3): qty 2

## 2019-11-11 MED ORDER — VITAL AF 1.2 CAL PO LIQD: 1000.0000 mL | ORAL | Status: DC

## 2019-11-11 NOTE — Progress Notes (Signed)
NAMETom Orozco, MRN:  [id number], DOB:  20-Dec-1965, LOS: 10 ADMISSION DATE:  11/01/2019, CONSULTATION DATE:  9/29 REFERRING MD:  EDP, CHIEF COMPLAINT:  Cardiac arrest    Brief History   54yo male with hx HIV (last CD4 count 191 on 10/12/19), ETOH with recent ER visit 9/27 after being picked up by police intoxicated. Returned 9/29 after being found down outside convenience store.  Pulseless on EMS arrival with PEA.  Unknown downtime prior to EMS arrival. EMS performed 2 rounds of CPR with 2 epi before ROSC.  Admitted to ICU.   In ER lactate 4.4 post CPR, troponin 450, electrolytes wnl, ETOH 177  Past Medical History  ETOH Abuse HIV - dx 1995  Significant Hospital Events   9/29 Admit post PEA arrest.  UDS positive for THC, benzo's.  ETOH 177 10/05 EEG ongoing. Versed restarted overnight due to agitation. Nicardipine increased.  10/06 Developed tachypnea with WUA, no follow commands off sedation  10/07 Vomiting, TF held / restarted with recurrent vomiting  Consults:  Neuro  Procedures:  ETT 9/29 >>  Significant Diagnostic Tests:  CT head 9/29 >  No evidence of acute intracranial abnormality. Moderate generalized cerebral atrophy, advanced for age. Chronic medially displaced fracture deformity of the right lamina papyracea. Mild ethmoid and maxillary sinus mucosal thickening.  CT cervical spine 9/29 > No evidence of acute fracture to the cervical spine. Partially imaged airspace disease within the imaged lung apices (extensive on the left). Clinical correlation is recommended. 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  EEG 9/29 > Patient was noted to have frequent episodes of axial jerking with eye opening. Concomitant EEG showed generalized polyspikes consistent with myoclonic seizures. EEG also showed continuous generalized background suppression. EEG was not reactive to tactile stimulation.  Hyperventilation and photic stimulation were not performed  ECHO 9/30 > No evidence of wall motion abnormality  MRI 10/3 > Mild DWI hyperintensity involving the caudate and possibly putamen bilaterally. Additionally, possible subtle FLAIR hyperintensity involving the cerebellum, cortex diffusely, and bilateral basal ganglia. Although not diagnostic, these findings could be seen with early hypoxic/ischemic brain injury in this patient status post PEA arrest.   Micro Data:  BCx2 9/29 >> negative  Urine 9/29 >> UA few bacteria, WBC, RBC, CaOxalate, and Mucus Covid, Flu A/B 9/29 >> negative Tracheal aspirate 10/5 >> normal flora   Antimicrobials:  Vancomycin 9/29 >> 9/30 Cefepime 9/29 >> 9/30 Cefepime 10/5 >>   Interim history/subjective:  OG to low intermittent suction yesterday and overnight for emesis yesterday. TF on hold.  No acute events overnight  Objective   Blood pressure 124/77, pulse (!) 53, temperature 97.7 F (36.5 C), temperature source Axillary, resp. rate 20, height 5\' 6"  (1.676 m), weight 76.1 kg, SpO2 100 %.    Vent Mode: PRVC FiO2 (%):  [30 %] 30 % Set Rate:  [20 bmp] 20 bmp Vt Set:  [510 mL] 510 mL PEEP:  [5 cmH20] 5 cmH20 Plateau Pressure:  [14 cmH20-19 cmH20] 14 cmH20   Intake/Output Summary (Last 24 hours) at 11/11/2019 1206 Last data filed at 11/11/2019 1100 Gross per 24 hour  Intake 1039.99 ml  Output 3110 ml  Net -2070.01 ml   Filed Weights   11/09/19 0329 11/10/19 0200 11/11/19 0500  Weight: 83 kg 83 kg 76.1 kg    Examination:  General: adult male lying in bed, intubated HEENT: MM pink/moist, ETT, PERRL Neuro: on propofol 7.15mcg,  no response to verbal stimuli, +gag/cough with suction, +corneal response, minimal withdrawal to painful stimuli, no follow commands, does not blink to threat, rhythmic movement of BUE's and eyes when held in open position CV: s1s2 rrr, no m/r/g PULM: non-labored on vent, lungs bilaterally coarse rhonchi, creamy  secretions from ETT GI: soft, bsx4 active  Extremities: warm/dry, no edema Skin: no rashes or lesions  CXR 10/9 >> ETT in good position, NGT in good position, mild bibasilar airspace disease  Resolved Hospital Problem list     Assessment & Plan:   Myoclonic Seizures and AMS  Post arrest myoclonuic seizures, started on Keppra and Versed for burst suppression > then transitioned to propofol to allow for neuro assessment. Versed gtt stopped at 7am on 10/5.   -appreciate Neurology input  -minimize sedating medications to allow for neuro exam  -Benozodiazepine still positive on UDS on 10/8, drug continues to linger in system  -continue keppra  -assess EEG with exam findings as above, suspect myoclonus  -discussed with Neurology, will plan for repeat MRI early next week  Cardiac Arrest Hypertension PEA on EMS arrival. No bystander CPR, unknown downtime.  2 rounds CPR/epi with EMS prior to ROSC. EKG normal sinus rhythm with peaked T waves, K 4.2, Echo normal without wall abnormality. Arctic sun (normothermia) stopped 10/3.  -continue amlodipine, lopressor BID -lisinopril 10 mg PT -hold lasix 10/9, euvolemic on exam   Acute Hypoxic Respiratory Failure Resp failure requiring airway protection intubation due to post-arrest myoclonic seizures, LUL infiltrates noted on CT and CXR in setting of ETOH hx and Hx of HIV. Spiked fever overnight and WBC increase. Increased ETT secretions. Rule out pneumnia. Initially with peripheral edema / volume up on exam.  Negative tracheal aspirate.  -PRVC 8cc/kg  -daily SBT / WUA, mental status remains barrier to extubation / weaning -UDS as above as was on high dose benzodiazepines  -continue empiric cefepime, D5/7, stop date added   Hypokalemia -follow trend, replace as indicated   Hx ETOH abuse  -thiamine, folate -sedation as above   Hx HIV Reportedly not taking meds.  CD4 124 -hold home medications for now, consider restart pending neuro  prognostication (pt was not taking at home)  At Risk Malnutrition  Nausea / Vomiting  KUB negative on 10/7, absence of gas.  Small amt liquid stool on exam 10/8.  -TF per Nutrition, hold for now -NGT to LIS  -consider trickle feeding tomorrow -continue miralax -Erythromycin tried yesterday, start metoclopramide x 3 doses today -pending neuro prognostication, may need cortrak post pyloric    Best practice:  Diet: NPO Pain/Anxiety/Delirium protocol (if indicated): PAD protocol  VAP protocol (if indicated): ordered DVT prophylaxis: SQ heparin  GI prophylaxis: PPI Glucose control: n/a Mobility: BR Code Status: full code Family Communication: Will update family today. Daughter updated 10/8 via phone - Thurmond Butts [phone].  Family will be at funeral for patients brother over the weekend.  For contact, may have to leave a message.  Disposition: ICU  Labs   CBC: Recent Labs  Lab 11/07/19 0320 11/08/19 0302 11/09/19 0354 11/10/19 0438 11/11/19 0555  WBC 10.1 6.6 7.2 7.0 8.8  HGB 12.4* 11.7* 12.3* 12.5* 13.5  HCT 39.1 36.5* 38.6* 39.0 43.4  MCV 96.3 95.8 96.5 94.9 96.7  PLT 185 168 229 252 256    Basic Metabolic Panel: Recent Labs  Lab 11/07/19 0320 11/08/19 0302 11/09/19 0354 11/10/19 0438 11/11/19 0555  NA 139 138 142 141 143  K 3.8 3.7 3.5 3.5 3.5  CL 106 104 107 105 103  CO2 23 25 24 24 26   GLUCOSE 125* 118* 138* 115* 100*  BUN 11 12 18 19  25*  CREATININE 0.63 0.54* 0.60* 0.59* 0.83  CALCIUM 8.8* 8.9 9.1 10.0 10.6*  MG 1.8 2.2  --  2.3  --    GFR: Estimated Creatinine Clearance: 91.8 mL/min (by C-G formula based on SCr of 0.83 mg/dL). Recent Labs  Lab 11/08/19 0302 11/09/19 0354 11/10/19 0438 11/11/19 0555  PROCALCITON <0.10 0.11  --   --   WBC 6.6 7.2 7.0 8.8    Liver Function Tests: Recent Labs  Lab 11/06/19 0350 11/07/19 0320 11/08/19 0302 11/10/19 0438 11/11/19 0555  AST 47* 42* 33 32 43*  ALT 27 26 22 19 20   ALKPHOS 76 83 77 77 95   BILITOT 0.6 0.4 0.6 0.8 0.7  PROT 5.8* 6.8 6.3* 6.9 7.9  ALBUMIN 2.5* 2.8* 2.6* 2.7* 3.0*   No results for input(s): LIPASE, AMYLASE in the last 168 hours. No results for input(s): AMMONIA in the last 168 hours.  ABG    Component Value Date/Time   PHART 7.408 11/02/2019 0505   PCO2ART 38.7 11/02/2019 0505   PO2ART 103 11/02/2019 0505   HCO3 24.4 11/02/2019 0505   TCO2 26 11/02/2019 0505   ACIDBASEDEF 7.0 (H) 11/01/2019 2118   O2SAT 98.0 11/02/2019 0505     Coagulation Profile: No results for input(s): INR, PROTIME in the last 168 hours.  Cardiac Enzymes: No results for input(s): CKTOTAL, CKMB, CKMBINDEX, TROPONINI in the last 168 hours.  HbA1C: No results found for: HGBA1C  CBG: Recent Labs  Lab 11/10/19 1933 11/10/19 2347 11/11/19 0426 11/11/19 0716 11/11/19 1109  GLUCAP 79 103* 108* 91 96   CC time: 35 minutes  01/11/20, MD [facility] Office: [phone]   See Amion for Pager Details

## 2019-11-12 ENCOUNTER — Inpatient Hospital Stay (HOSPITAL_COMMUNITY): Payer: Medicaid Other

## 2019-11-12 LAB — GLUCOSE, CAPILLARY
Glucose-Capillary: 90 mg/dL (ref 70–99)
Glucose-Capillary: 91 mg/dL (ref 70–99)
Glucose-Capillary: 91 mg/dL (ref 70–99)
Glucose-Capillary: 94 mg/dL (ref 70–99)
Glucose-Capillary: 96 mg/dL (ref 70–99)
Glucose-Capillary: 99 mg/dL (ref 70–99)

## 2019-11-12 MED ORDER — PROSOURCE TF PO LIQD
45.0000 mL | Freq: Two times a day (BID) | ORAL | Status: DC
  Administered 2019-11-12 – 2019-11-14 (×5): 45 mL
  Filled 2019-11-12 (×5): qty 45

## 2019-11-12 MED ORDER — PROSOURCE TF PO LIQD: 10.0000 mL | Freq: Two times a day (BID) | ORAL | Status: DC

## 2019-11-12 MED ORDER — VITAL AF 1.2 CAL PO LIQD
1000.0000 mL | ORAL | Status: DC
  Administered 2019-11-12: 1000 mL
  Filled 2019-11-12: qty 1000

## 2019-11-12 NOTE — Progress Notes (Signed)
NAMEStacey Orozco, MRN:  [id number], DOB:  [birth year], LOS: 11 ADMISSION DATE:  11/01/2019, CONSULTATION DATE:  9/29 REFERRING MD:  EDP, CHIEF COMPLAINT:  Cardiac arrest    Brief History   54yo male with hx HIV (last CD4 count 191 on 10/12/19), ETOH with recent ER visit 9/27 after being picked up by police intoxicated. Returned 9/29 after being found down outside convenience store.  Pulseless on EMS arrival with PEA.  Unknown downtime prior to EMS arrival. EMS performed 2 rounds of CPR with 2 epi before ROSC.  Admitted to ICU.   In ER lactate 4.4 post CPR, troponin 450, electrolytes wnl, ETOH 177  Past Medical History  ETOH Abuse HIV - dx 1995  Significant Hospital Events   9/29 Admit post PEA arrest.  UDS positive for THC, benzo's.  ETOH 177 10/05 EEG ongoing. Versed restarted overnight due to agitation. Nicardipine increased.  10/06 Developed tachypnea with WUA, no follow commands off sedation  10/07 Vomiting, TF held / restarted with recurrent vomiting  Consults:  Neuro  Procedures:  ETT 9/29 >>  Significant Diagnostic Tests:  CT head 9/29 >  No evidence of acute intracranial abnormality. Moderate generalized cerebral atrophy, advanced for age. Chronic medially displaced fracture deformity of the right lamina papyracea. Mild ethmoid and maxillary sinus mucosal thickening.  CT cervical spine 9/29 > No evidence of acute fracture to the cervical spine. Partially imaged airspace disease within the imaged lung apices (extensive on the left). Clinical correlation is recommended. 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  EEG 9/29 > Patient was noted to have frequent episodes of axial jerking with eye opening. Concomitant EEG showed generalized polyspikes consistent with myoclonic seizures. EEG also showed continuous generalized background suppression. EEG was not reactive to tactile stimulation.  Hyperventilation and photic stimulation were not performed  ECHO 9/30 > No evidence of wall motion abnormality  MRI 10/3 > Mild DWI hyperintensity involving the caudate and possibly putamen bilaterally. Additionally, possible subtle FLAIR hyperintensity involving the cerebellum, cortex diffusely, and bilateral basal ganglia. Although not diagnostic, these findings could be seen with early hypoxic/ischemic brain injury in this patient status post PEA arrest.   Micro Data:  BCx2 9/29 >> negative  Urine 9/29 >> UA few bacteria, WBC, RBC, CaOxalate, and Mucus Covid, Flu A/B 9/29 >> negative Tracheal aspirate 10/5 >> normal flora   Antimicrobials:  Vancomycin 9/29 >> 9/30 Cefepime 9/29 >> 9/30 Cefepime 10/5 >>   Interim history/subjective:  OG to low intermittent suction yesterday and overnight for emesis on 10/8. TF on hold.  No acute events overnight  Objective   Blood pressure 125/79, pulse 62, temperature 99.5 F (37.5 C), temperature source Axillary, resp. rate 20, height 5\' 6"  (1.676 m), weight 76.8 kg, SpO2 100 %.    Vent Mode: PRVC FiO2 (%):  [30 %] 30 % Set Rate:  [20 bmp] 20 bmp Vt Set:  [510 mL] 510 mL PEEP:  [5 cmH20] 5 cmH20 Plateau Pressure:  [12 cmH20-18 cmH20] 15 cmH20   Intake/Output Summary (Last 24 hours) at 11/12/2019 1135 Last data filed at 11/12/2019 1000 Gross per 24 hour  Intake 692.13 ml  Output 830 ml  Net -137.87 ml   Filed Weights   11/10/19 0200 11/11/19 0500 11/12/19 0421  Weight: 83 kg 76.1 kg 76.8 kg    Examination:  General: adult male lying in bed, intubated HEENT: MM pink/moist, ETT, PERRL Neuro: on propofol 5,  no response to verbal stimuli, +gag/cough with suction, +corneal response, not following commands, does blink to threat CV: s1s2 rrr, no m/r/g PULM: non-labored on vent, course breath sounds GI: soft, bsx4 active  Extremities: warm/dry, no edema Skin: no rashes or lesions  CXR 10/9 >> ETT in good position, NGT in good  position, mild bibasilar airspace disease  Resolved Hospital Problem list     Assessment & Plan:   Myoclonic Seizures and AMS  Post arrest myoclonuic seizures, started on Keppra and Versed for burst suppression > then transitioned to propofol to allow for neuro assessment. Versed gtt stopped at 7am on 10/5.   -appreciate Neurology input  -minimize sedating medications to allow for neuro exam  -Benozodiazepine still positive on UDS on 10/8, drug continues to linger in system. Repeat drug screen on 10/11 in the AM  -continue keppra  -assess EEG with exam findings as above, suspect myoclonus  -discussed with Neurology, will plan for repeat MRI early next week  Cardiac Arrest Hypertension PEA on EMS arrival. No bystander CPR, unknown downtime.  2 rounds CPR/epi with EMS prior to ROSC. EKG normal sinus rhythm with peaked T waves, K 4.2, Echo normal without wall abnormality. Arctic sun (normothermia) stopped 10/3.  -continue amlodipine, lopressor BID -lisinopril 10 mg PT -hold lasix 10/9, euvolemic on exam   Acute Hypoxic Respiratory Failure Resp failure requiring airway protection intubation due to post-arrest myoclonic seizures, LUL infiltrates noted on CT and CXR in setting of ETOH hx and Hx of HIV. Spiked fever overnight and WBC increase. Increased ETT secretions. Rule out pneumnia. Initially with peripheral edema / volume up on exam.  Negative tracheal aspirate.  -PRVC 8cc/kg  -daily SBT / WUA, mental status remains barrier to extubation / weaning -UDS as above as was on high dose benzodiazepines  -continue empiric cefepime, D6/7, stop date added   Hypokalemia -follow trend, replace as indicated   Hx ETOH abuse  -thiamine, folate -sedation as above   Hx HIV Reportedly not taking meds.  CD4 124 -hold home medications for now, consider restart pending neuro prognostication (pt was not taking at home)  At Risk Malnutrition  Nausea / Vomiting  KUB negative on 10/7, absence of  gas.  Small amt liquid stool on exam 10/8.  -TF per Nutrition, restart trickle feeds today -continue miralax -pending neuro prognostication, may need cortrak post pyloric    Best practice:  Diet: Trickle TF Pain/Anxiety/Delirium protocol (if indicated): PAD protocol  VAP protocol (if indicated): ordered DVT prophylaxis: SQ heparin  GI prophylaxis: PPI Glucose control: n/a Mobility: BR Code Status: full code Family Communication: Will update family today. Daughter updated 10/8 via phone - Thurmond Butts [phone].  Family will be at funeral for patients brother over the weekend.  For contact, may have to leave a message.  Disposition: ICU  Labs   CBC: Recent Labs  Lab 11/07/19 0320 11/08/19 0302 11/09/19 0354 11/10/19 0438 11/11/19 0555  WBC 10.1 6.6 7.2 7.0 8.8  HGB 12.4* 11.7* 12.3* 12.5* 13.5  HCT 39.1 36.5* 38.6* 39.0 43.4  MCV 96.3 95.8 96.5 94.9 96.7  PLT 185 168 229 252 256    Basic Metabolic Panel: Recent Labs  Lab 11/07/19 0320 11/08/19 0302 11/09/19 0354 11/10/19 0438 11/11/19 0555  NA 139 138 142 141 143  K 3.8 3.7 3.5 3.5 3.5  CL 106 104 107 105 103  CO2 23 25 24 24 26   GLUCOSE 125* 118* 138* 115* 100*  BUN 11 12 18 19  25*  CREATININE 0.63 0.54* 0.60* 0.59* 0.83  CALCIUM 8.8* 8.9 9.1 10.0 10.6*  MG 1.8 2.2  --  2.3  --    GFR: Estimated Creatinine Clearance: 99.3 mL/min (by C-G formula based on SCr of 0.83 mg/dL). Recent Labs  Lab 11/08/19 0302 11/09/19 0354 11/10/19 0438 11/11/19 0555  PROCALCITON <0.10 0.11  --   --   WBC 6.6 7.2 7.0 8.8    Liver Function Tests: Recent Labs  Lab 11/06/19 0350 11/07/19 0320 11/08/19 0302 11/10/19 0438 11/11/19 0555  AST 47* 42* 33 32 43*  ALT 27 26 22 19 20   ALKPHOS 76 83 77 77 95  BILITOT 0.6 0.4 0.6 0.8 0.7  PROT 5.8* 6.8 6.3* 6.9 7.9  ALBUMIN 2.5* 2.8* 2.6* 2.7* 3.0*   No results for input(s): LIPASE, AMYLASE in the last 168 hours. No results for input(s): AMMONIA in the last 168  hours.  ABG    Component Value Date/Time   PHART 7.408 11/02/2019 0505   PCO2ART 38.7 11/02/2019 0505   PO2ART 103 11/02/2019 0505   HCO3 24.4 11/02/2019 0505   TCO2 26 11/02/2019 0505   ACIDBASEDEF 7.0 (H) 11/01/2019 2118   O2SAT 98.0 11/02/2019 0505     Coagulation Profile: No results for input(s): INR, PROTIME in the last 168 hours.  Cardiac Enzymes: No results for input(s): CKTOTAL, CKMB, CKMBINDEX, TROPONINI in the last 168 hours.  HbA1C: No results found for: HGBA1C  CBG: Recent Labs  Lab 11/11/19 1957 11/11/19 2346 11/12/19 0356 11/12/19 0816 11/12/19 1118  GLUCAP 105* 96 99 91 96   CC time: 35 minutes  01/12/20, MD [facility] Office: [phone]   See Amion for Pager Details

## 2019-11-13 ENCOUNTER — Inpatient Hospital Stay (HOSPITAL_COMMUNITY): Payer: Medicaid Other

## 2019-11-13 DIAGNOSIS — I469 Cardiac arrest, cause unspecified: Secondary | ICD-10-CM

## 2019-11-13 LAB — RAPID URINE DRUG SCREEN, HOSP PERFORMED
Amphetamines: NOT DETECTED
Barbiturates: NOT DETECTED
Benzodiazepines: POSITIVE — AB
Cocaine: NOT DETECTED
Opiates: NOT DETECTED
Tetrahydrocannabinol: NOT DETECTED

## 2019-11-13 LAB — GLUCOSE, CAPILLARY
Glucose-Capillary: 93 mg/dL (ref 70–99)
Glucose-Capillary: 75 mg/dL (ref 70–99)
Glucose-Capillary: 87 mg/dL (ref 70–99)
Glucose-Capillary: 93 mg/dL (ref 70–99)
Glucose-Capillary: 88 mg/dL (ref 70–99)
Glucose-Capillary: 96 mg/dL (ref 70–99)

## 2019-11-13 MED ORDER — SULFAMETHOXAZOLE-TRIMETHOPRIM 800-160 MG PO TABS
1.0000 | ORAL_TABLET | ORAL | Status: DC
  Administered 2019-11-15 – 2020-04-08 (×64): 1
  Filled 2019-11-13 (×68): qty 1

## 2019-11-13 MED ORDER — EMTRICITABINE-TENOFOVIR AF 200-25 MG PO TABS
1.0000 | ORAL_TABLET | Freq: Every day | ORAL | Status: DC
  Administered 2019-11-13 – 2019-12-12 (×29): 1
  Filled 2019-11-13 (×30): qty 1

## 2019-11-13 MED ORDER — VITAL AF 1.2 CAL PO LIQD: 1000.0000 mL | ORAL | Status: DC

## 2019-11-13 MED ORDER — SULFAMETHOXAZOLE-TRIMETHOPRIM 800-160 MG PO TABS
1.0000 | ORAL_TABLET | ORAL | Status: DC
  Administered 2019-11-13: 1 via ORAL
  Filled 2019-11-13: qty 1

## 2019-11-13 MED ORDER — DOLUTEGRAVIR SODIUM 50 MG PO TABS
50.0000 mg | ORAL_TABLET | Freq: Every day | ORAL | Status: DC
  Administered 2019-11-13 – 2019-12-12 (×29): 50 mg via ORAL
  Filled 2019-11-13 (×30): qty 1

## 2019-11-13 MED ORDER — BICTEGRAVIR-EMTRICITAB-TENOFOV 50-200-25 MG PO TABS: 1.0000 | ORAL_TABLET | Freq: Every day | ORAL | Status: DC

## 2019-11-13 NOTE — Progress Notes (Addendum)
Subjective: No further clinical seizure-like episodes.  ROS: Unable to obtain due to poor mental status  Examination  Vital signs in last 24 hours: Temp:  [98.2 F (36.8 C)-99.2 F (37.3 C)] 98.2 F (36.8 C) (10/11 1149) Pulse Rate:  [48-88] 54 (10/11 1400) Resp:  [16-22] 20 (10/11 1400) BP: (99-159)/(61-105) 127/78 (10/11 1400) SpO2:  [100 %] 100 % (10/11 1400) FiO2 (%):  [30 %] 30 % (10/11 1200) Weight:  [74.9 kg] 74.9 kg (10/11 0500)  General: lying in bed,not in apparent distress CVS: pulse-normal rate and rhythm RS: breathing comfortably,intubated Extremities: normal,warm Neuro:Opens eyes on noxious stimulation, does not look at the examiner, does not follow commands, pupils equally round and reactive, corneal reflex intact, gag reflex intact,withdraw to noxious stimuli in all 4 extremities.    Mildly increased tone in left upper and lower extremity which improved as patient became drowsy again, 3+ reflexes in all 4 extremities  Basic Metabolic Panel: Recent Labs  Lab 11/07/19 0320 11/07/19 0320 11/08/19 0302 11/08/19 0302 11/09/19 0354 11/10/19 0438 11/11/19 0555  NA 139  --  138  --  142 141 143  K 3.8  --  3.7  --  3.5 3.5 3.5  CL 106  --  104  --  107 105 103  CO2 23  --  25  --  24 24 26   GLUCOSE 125*  --  118*  --  138* 115* 100*  BUN 11  --  12  --  18 19 25*  CREATININE 0.63  --  0.54*  --  0.60* 0.59* 0.83  CALCIUM 8.8*   < > 8.9   < > 9.1 10.0 10.6*  MG 1.8  --  2.2  --   --  2.3  --    < > = values in this interval not displayed.    CBC: Recent Labs  Lab 11/07/19 0320 11/08/19 0302 11/09/19 0354 11/10/19 0438 11/11/19 0555  WBC 10.1 6.6 7.2 7.0 8.8  HGB 12.4* 11.7* 12.3* 12.5* 13.5  HCT 39.1 36.5* 38.6* 39.0 43.4  MCV 96.3 95.8 96.5 94.9 96.7  PLT 185 168 229 252 256     Coagulation Studies: No results for input(s): LABPROT, INR in the last 72 hours.  Imaging Repeat MRI brain 11/13/2019: As before, subtle T2/FLAIR hyperintense  signal abnormality is questioned throughout much of the cerebellum and at multiple sites within the bilateral cerebral cortex. The previously described symmetric basal ganglia signal abnormality has become less conspicuous. Given the provided history of recent PEA arrest, these findings are suspicious for evolving sequela of hypoxic/ischemic injury. No intracranial mass effect or herniation.   ASSESSMENT AND PLAN: 54 year old male with past medical history of alcohol use, HIV, hypertension who was found outside a convenience store. EMS found him in PEA arrest with unknown downtime and required 2 rounds of CPR with ROSC. He was noted to have myoclonic seizures on arrival and was started on Keppra and Versed.  Seizures resolved and patient has now been opening eyes but not following commands.  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   -UDS this morning still showing benzodiazepines. -MR appears to be involving more of the cerebellum with relative sparing of cerebral hemispheres and therefore it is likely that patient will continue to have more neurologic recovery and possibly improvement in speech  Recommendation -ContinueKeppra to 1500 mg twice daily -Can consider routine EEG if patient has  any further seizure-like activity - Discussed mri finding with daughter, She states she will discuss with her husband and aunt but most likely would like to proceed with trach and peg.  -Neuroprotective measures including normoglycemia, normothermia, normonatremia -Seizure precautions -Management of rest of comorbidities per primary team  I have spent a total of70minuteswith the patient reviewing hospitalnotes, test results, labs and examining the patient as well as establishing an assessment and plan.>50% of time was spent in direct patient  care   Gini Caputo [facility] For questions after 5pm please refer to AMION to reach the Neurologist on call

## 2019-11-13 NOTE — Progress Notes (Signed)
NAMERamel Orozco, MRN:  [id number], DOB:  29-May-1965, LOS: 12 ADMISSION DATE:  11/01/2019, CONSULTATION DATE:  9/29 REFERRING MD:  EDP, CHIEF COMPLAINT:  Cardiac arrest    Brief History   54yo male with hx HIV (last CD4 count 191 on 10/12/19), ETOH with recent ER visit 9/27 after being picked up by police intoxicated. Returned 9/29 after being found down outside convenience store.  Pulseless on EMS arrival with PEA.  Unknown downtime prior to EMS arrival. EMS performed 2 rounds of CPR with 2 epi before ROSC.  Admitted to ICU.   In ER lactate 4.4 post CPR, troponin 450, electrolytes wnl, ETOH 177  Past Medical History  ETOH Abuse HIV - dx 1995  Significant Hospital Events   9/29 Admit post PEA arrest.  UDS positive for THC, benzo's.  ETOH 177 10/05 EEG ongoing. Versed restarted overnight due to agitation. Nicardipine increased.  10/06 Developed tachypnea with WUA, no follow commands off sedation  10/07 Vomiting, TF held / restarted with recurrent vomiting  Consults:  Neuro  Procedures:  ETT 9/29 >>  Significant Diagnostic Tests:  CT head 9/29 >  No evidence of acute intracranial abnormality. Moderate generalized cerebral atrophy, advanced for age. Chronic medially displaced fracture deformity of the right lamina papyracea. Mild ethmoid and maxillary sinus mucosal thickening.  CT cervical spine 9/29 > No evidence of acute fracture to the cervical spine. Partially imaged airspace disease within the imaged lung apices (extensive on the left). Clinical correlation is recommended. 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  EEG 9/29 > Patient was noted to have frequent episodes of axial jerking with eye opening. Concomitant EEG showed generalized polyspikes consistent with myoclonic seizures. EEG also showed continuous generalized background suppression. EEG was not reactive to tactile stimulation.  Hyperventilation and photic stimulation were not performed  ECHO 9/30 > No evidence of wall motion abnormality  MRI 10/3 > Mild DWI hyperintensity involving the caudate and possibly putamen bilaterally. Additionally, possible subtle FLAIR hyperintensity involving the cerebellum, cortex diffusely, and bilateral basal ganglia. Although not diagnostic, these findings could be seen with early hypoxic/ischemic brain injury in this patient status post PEA arrest.   EEG 10/08> severe diffuse encephalopathy likely related to anoxic/hypoxic brain injury  Testicular US 10/10> showed large left hydrocele and left inguinal hernia. No evidence of testicular mass.  Micro Data:  BCx2 9/29 >> negative  Urine 9/29 >> UA few bacteria, WBC, RBC, CaOxalate, and Mucus Covid, Flu A/B 9/29 >> negative Tracheal aspirate 10/5 >> normal flora   Antimicrobials:  Vancomycin 9/29 >> 9/30 Cefepime 9/29 >> 9/30 Cefepime 10/5 >> 10/11  Interim history/subjective:  No acute events overnight Repeat UDS showed: Benzos Did not tolerate pressure support, back on PRVC.  Objective   Blood pressure (!) 146/88, pulse 62, temperature 98.3 F (36.8 C), temperature source Axillary, resp. rate 19, height 5\' 6"  (1.676 m), weight 74.9 kg, SpO2 100 %.    Vent Mode: PRVC FiO2 (%):  [30 %] 30 % Set Rate:  [20 bmp] 20 bmp Vt Set:  [510 mL] 510 mL PEEP:  [5 cmH20] 5 cmH20 Plateau Pressure:  [15 cmH20-18 cmH20] 15 cmH20   Intake/Output Summary (Last 24 hours) at 11/13/2019 0758 Last data filed at 11/12/2019 1800 Gross per 24 hour  Intake 354.99 ml  Output 640 ml  Net -285.01 ml   Filed Weights   11/11/19 0500 11/12/19 0421 11/13/19 0500  Weight: 76.1  kg 76.8 kg 74.9 kg    Examination:  General: critically ill appearing 54 yr old male, lying in bed, intubated  HEENT: NCAT, no scleral icterus, ETT in place, PERRLA 61mm bilaterally  Neuro: +corneal response, withdraws to pain in UEs not LEs CV: s1 and s2 present,  RRR PULM: course breath sounds bilaterally, breathing synchronously on vent  GI: non distended, soft, bowel sounds present Extremities: no peripheral edema, clubbing or cyanosis  Skin: warm and dry  CXR 10/9 >> ETT in good position, NGT in good position, mild bibasilar airspace disease  Resolved Hospital Problem list     Assessment & Plan:   Myoclonic seizures, 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 Versed gtt stopped at 7am on 10/5.  Repeat UDS positive for benzos today. -Appreciate Neurology recs -Minimize sedating medications to allow for neuro exam  -Continue Keppra 1500mg  BID  -MRI brain later today per neurology  -Goals of care discussion with daughter  -Neuroprotective measures including normoglycemia, normothermia, normonatremia  Cardiac Arrest Hypertension PEA on EMS arrival. No bystander CPR, unknown downtime. 2 rounds CPR/epi with EMS prior to ROSC. EKG normal sinus rhythm with peaked T waves, K 4.2, Echo normal without wall abnormality. Arctic sun (normothermia) stopped 10/3. Continues to be euvolemic one exam -Continue Amlodipine 10mg ,  Lopressor 25mg  BID -Continue Lisinopril 10 mg PT -Continue to hold Lasix 10/9  Acute Hypoxic Respiratory Failure Resp failure requiring airway protection intubation due to post-arrest myoclonic seizures, LUL infiltrates noted on CT and CXR in setting of ETOH hx and Hx of HIV. S/p Vancomycin (9/29-9/30). Cefipime (9/29-9/30, 10/5-). Negative tracheal aspirates.  -PRVC 8cc/kg  -VAP protocol -Daily SBT / WUA, mental status remains barrier to extubation / weaning -Continue Cefepime, ends tonight, stop date added   Hypokalemia, resolved  -Daily BMP -Replace as indicated   Hx ETOH abuse  -Continue thiamine, folate -Sedation as above   Hx HIV Reportedly not taking meds. CD4 124 -hold home  medications for now, consider restart pending neuro prognostication (pt was not taking at home)  At Risk Malnutrition  Nausea / Vomiting  KUB negative on 10/7, absence of gas.  Small amt liquid stool on exam 10/8.  -TF per Nutrition, restart trickle feeds today -Continue miralax -Pending neuro prognostication, may need cortrak post pyloric \  Large left hydrocele, left inguinal hernia Testicular 07-31-1987 on 10/10 showed large left hydrocele and left inguinal hernia. No evidence of testicular mass. -Monitor left scrotal swelling  Best practice:  Diet: Trickle TF Pain/Anxiety/Delirium protocol (if indicated): PAD protocol  VAP protocol (if indicated): ordered DVT prophylaxis: SQ heparin  GI prophylaxis: PPI Glucose control: n/a Mobility: BR Code Status: full code Family Communication: Will update family today. Daughter updated 10/8 via phone - [place] [phone].  Family will be at funeral for patients brother over the weekend.  For contact, may have to leave a message.  Disposition: ICU  Labs   CBC: Recent Labs  Lab 11/07/19 0320 11/08/19 0302 11/09/19 0354 11/10/19 0438 11/11/19 0555  WBC 10.1 6.6 7.2 7.0 8.8  HGB 12.4* 11.7* 12.3* 12.5* 13.5  HCT 39.1 36.5* 38.6* 39.0 43.4  MCV 96.3 95.8 96.5 94.9 96.7  PLT 185 168 229 252 256    Basic Metabolic Panel: Recent Labs  Lab 11/07/19 0320 11/08/19 0302 11/09/19 0354 11/10/19 0438 11/11/19 0555  NA 139 138 142 141 143  K 3.8  3.7 3.5 3.5 3.5  CL 106 104 107 105 103  CO2 23 25 24 24 26   GLUCOSE 125* 118* 138* 115* 100*  BUN 11 12 18 19  25*  CREATININE 0.63 0.54* 0.60* 0.59* 0.83  CALCIUM 8.8* 8.9 9.1 10.0 10.6*  MG 1.8 2.2  --  2.3  --    GFR: Estimated Creatinine Clearance: 91.8 mL/min (by C-G formula based on SCr of 0.83 mg/dL). Recent Labs  Lab 11/08/19 0302 11/09/19 0354 11/10/19 0438 11/11/19 0555  PROCALCITON <0.10 0.11  --   --   WBC 6.6 7.2 7.0 8.8    Liver Function Tests: Recent Labs  Lab  11/07/19 0320 11/08/19 0302 11/10/19 0438 11/11/19 0555  AST 42* 33 32 43*  ALT 26 22 19 20   ALKPHOS 83 77 77 95  BILITOT 0.4 0.6 0.8 0.7  PROT 6.8 6.3* 6.9 7.9  ALBUMIN 2.8* 2.6* 2.7* 3.0*   No results for input(s): LIPASE, AMYLASE in the last 168 hours. No results for input(s): AMMONIA in the last 168 hours.  ABG    Component Value Date/Time   PHART 7.408 11/02/2019 0505   PCO2ART 38.7 11/02/2019 0505   PO2ART 103 11/02/2019 0505   HCO3 24.4 11/02/2019 0505   TCO2 26 11/02/2019 0505   ACIDBASEDEF 7.0 (H) 11/01/2019 2118   O2SAT 98.0 11/02/2019 0505     Coagulation Profile: No results for input(s): INR, PROTIME in the last 168 hours.  Cardiac Enzymes: No results for input(s): CKTOTAL, CKMB, CKMBINDEX, TROPONINI in the last 168 hours.  HbA1C: No results found for: HGBA1C  CBG: Recent Labs  Lab 11/12/19 1543 11/12/19 2049 11/12/19 2331 11/13/19 0356 11/13/19 0727  GLUCAP 90 91 94 93 75   CC time: 35 minutes  01/12/20, MD [facility] Office: [phone]   See Amion for Pager Details

## 2019-11-14 ENCOUNTER — Inpatient Hospital Stay (HOSPITAL_COMMUNITY): Payer: Medicaid Other

## 2019-11-14 DIAGNOSIS — L8990 Pressure ulcer of unspecified site, unspecified stage: Secondary | ICD-10-CM | POA: Diagnosis present

## 2019-11-14 DIAGNOSIS — I469 Cardiac arrest, cause unspecified: Secondary | ICD-10-CM

## 2019-11-14 LAB — COMPREHENSIVE METABOLIC PANEL
ALT: 24 U/L (ref 0–44)
AST: 46 U/L — ABNORMAL HIGH (ref 15–41)
Albumin: 2.8 g/dL — ABNORMAL LOW (ref 3.5–5.0)
Alkaline Phosphatase: 115 U/L (ref 38–126)
Anion gap: 11 (ref 5–15)
BUN: 19 mg/dL (ref 6–20)
CO2: 19 mmol/L — ABNORMAL LOW (ref 22–32)
Calcium: 9.2 mg/dL (ref 8.9–10.3)
Chloride: 114 mmol/L — ABNORMAL HIGH (ref 98–111)
Creatinine, Ser: 0.78 mg/dL (ref 0.61–1.24)
GFR, Estimated: >60 mL/min (ref >60)
Glucose, Bld: 107 mg/dL — ABNORMAL HIGH (ref 70–99)
Potassium: 3.3 mmol/L — ABNORMAL LOW (ref 3.5–5.1)
Sodium: 144 mmol/L (ref 135–145)
Total Bilirubin: 0.3 mg/dL (ref 0.3–1.2)
Total Protein: 6.7 g/dL (ref 6.5–8.1)

## 2019-11-14 LAB — CBC WITH DIFFERENTIAL/PLATELET
Abs Immature Granulocytes: 0.18 10*3/uL — ABNORMAL HIGH (ref 0.00–0.07)
Basophils Absolute: 0.1 10*3/uL (ref 0.0–0.1)
Basophils Relative: 1 %
Eosinophils Absolute: 0.1 10*3/uL (ref 0.0–0.5)
Eosinophils Relative: 2 %
HCT: 37.9 % — ABNORMAL LOW (ref 39.0–52.0)
Hemoglobin: 11.8 g/dL — ABNORMAL LOW (ref 13.0–17.0)
Immature Granulocytes: 5 %
Lymphocytes Relative: 12 %
Lymphs Abs: 0.5 10*3/uL — ABNORMAL LOW (ref 0.7–4.0)
MCH: 30.6 pg (ref 26.0–34.0)
MCHC: 31.1 g/dL (ref 30.0–36.0)
MCV: 98.2 fL (ref 80.0–100.0)
Monocytes Absolute: 0.5 10*3/uL (ref 0.1–1.0)
Monocytes Relative: 12 %
Neutro Abs: 2.6 10*3/uL (ref 1.7–7.7)
Neutrophils Relative %: 68 %
Platelets: 248 10*3/uL (ref 150–400)
RBC: 3.86 MIL/uL — ABNORMAL LOW (ref 4.22–5.81)
RDW: 13.4 % (ref 11.5–15.5)
WBC: 3.8 10*3/uL — ABNORMAL LOW (ref 4.0–10.5)
nRBC: 0 % (ref 0.0–0.2)

## 2019-11-14 LAB — GLUCOSE, CAPILLARY
Glucose-Capillary: 101 mg/dL — ABNORMAL HIGH (ref 70–99)
Glucose-Capillary: 103 mg/dL — ABNORMAL HIGH (ref 70–99)
Glucose-Capillary: 83 mg/dL (ref 70–99)
Glucose-Capillary: 91 mg/dL (ref 70–99)
Glucose-Capillary: 93 mg/dL (ref 70–99)
Glucose-Capillary: 97 mg/dL (ref 70–99)

## 2019-11-14 MED ORDER — LACTATED RINGERS IV BOLUS
1000.0000 mL | Freq: Once | INTRAVENOUS | Status: AC
  Administered 2019-11-14: 1000 mL via INTRAVENOUS

## 2019-11-14 MED ORDER — PANTOPRAZOLE SODIUM 40 MG PO PACK
40.0000 mg | PACK | Freq: Every day | ORAL | Status: DC
  Administered 2019-11-15 – 2019-11-16 (×2): 40 mg
  Filled 2019-11-14 (×3): qty 20

## 2019-11-14 MED ORDER — LACTATED RINGERS IV SOLN: INTRAVENOUS | Status: DC

## 2019-11-14 MED ORDER — PANTOPRAZOLE SODIUM 40 MG IV SOLR
40.0000 mg | Freq: Every day | INTRAVENOUS | Status: AC
  Administered 2019-11-14: 40 mg via INTRAVENOUS
  Filled 2019-11-14: qty 40

## 2019-11-14 MED ORDER — VITAL AF 1.2 CAL PO LIQD
1000.0000 mL | ORAL | Status: DC
  Administered 2019-11-15 – 2019-11-20 (×5): 1000 mL
  Filled 2019-11-14 (×3): qty 1000

## 2019-11-14 MED ORDER — DIATRIZOATE MEGLUMINE & SODIUM 66-10 % PO SOLN
90.0000 mL | Freq: Once | ORAL | Status: AC
  Administered 2019-11-14: 90 mL via NASOGASTRIC
  Filled 2019-11-14: qty 90

## 2019-11-14 MED ORDER — PROSOURCE TF PO LIQD
45.0000 mL | Freq: Three times a day (TID) | ORAL | Status: DC
  Administered 2019-11-14 – 2020-01-08 (×161): 45 mL
  Filled 2019-11-14 (×153): qty 45

## 2019-11-14 MED ORDER — POTASSIUM CHLORIDE 20 MEQ PO PACK
20.0000 meq | PACK | Freq: Once | ORAL | Status: AC
  Administered 2019-11-14: 20 meq
  Filled 2019-11-14: qty 1

## 2019-11-14 MED ORDER — POTASSIUM CHLORIDE 20 MEQ PO PACK
40.0000 meq | PACK | Freq: Once | ORAL | Status: AC
  Administered 2019-11-14: 40 meq
  Filled 2019-11-14: qty 2

## 2019-11-14 NOTE — Progress Notes (Signed)
IR requested by Dr. Merrily Pew for possible image-guided percutaneous gastrostomy tube placement.  Case/images have been reviewed by Dr. Lowella Dandy who recommends surgical consult (due to incarcerated inguinal hernia) prior to consideration for IR percutaneous gastrostomy tube placement. No plans for IR interventions at this time. Dr. Merrily Pew made aware.  IR to follow.   Waylan Boga Shenise Wolgamott, PA-C 11/14/2019, 3:15 PM

## 2019-11-14 NOTE — Progress Notes (Signed)
Provided pt's daughter with an update on Ryan Orozco. She would like to proceed with PEG tube and tracheostomy. I said both will likely be done tomorrow and that I will place the order for the PEG. She will be in later today to see Ryan Orozco.  Towanda Octave MD PGY2, Resident

## 2019-11-14 NOTE — Progress Notes (Signed)
NAMEDeakon Orozco, MRN:  [id number], DOB:  [birth year], LOS: 13 ADMISSION DATE:  11/01/2019, CONSULTATION DATE:  9/29 REFERRING MD:  EDP, CHIEF COMPLAINT:  Cardiac arrest    Brief History   54yo male with hx HIV (last CD4 count 191 on 10/12/19), ETOH with recent ER visit 9/27 after being picked up by police intoxicated. Returned 9/29 after being found down outside convenience store.  Pulseless on EMS arrival with PEA.  Unknown downtime prior to EMS arrival. EMS performed 2 rounds of CPR with 2 epi before ROSC.  Admitted to ICU.   In ER lactate 4.4 post CPR, troponin 450, electrolytes wnl, ETOH 177  Past Medical History  ETOH Abuse HIV - dx 1995  Significant Hospital Events   9/29 Admit post PEA arrest.  UDS positive for THC, benzo's.  ETOH 177 10/05 EEG ongoing. Versed restarted overnight due to agitation. Nicardipine increased.  10/06 Developed tachypnea with WUA, no follow commands off sedation  10/07 Vomiting, TF held / restarted with recurrent vomiting  Consults:  Neuro  Procedures:  ETT 9/29 >>  Significant Diagnostic Tests:  CT head 9/29 >  No evidence of acute intracranial abnormality. Moderate generalized cerebral atrophy, advanced for age. Chronic medially displaced fracture deformity of the right lamina papyracea. Mild ethmoid and maxillary sinus mucosal thickening.  CT cervical spine 9/29 > No evidence of acute fracture to the cervical spine. Partially imaged airspace disease within the imaged lung apices (extensive on the left). Clinical correlation is recommended. 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  EEG 9/29 > Patient was noted to have frequent episodes of axial jerking with eye opening. Concomitant EEG showed generalized polyspikes consistent with myoclonic seizures. EEG also showed continuous generalized background suppression. EEG was not reactive to tactile stimulation.  Hyperventilation and photic stimulation were not performed  ECHO 9/30 > No evidence of wall motion abnormality  MRI 10/3 > Mild DWI hyperintensity involving the caudate and possibly putamen bilaterally. Additionally, possible subtle FLAIR hyperintensity involving the cerebellum, cortex diffusely, and bilateral basal ganglia. Although not diagnostic, these findings could be seen with early hypoxic/ischemic brain injury in this patient status post PEA arrest.   EEG 10/08> severe diffuse encephalopathy likely related to anoxic/hypoxic brain injury  Testicular US 10/10> showed large left hydrocele and left inguinal hernia. No evidence of testicular mass.  MRI brain: evolving hypoxic, anoxic brain injury compared to previous MRI   Micro Data:  BCx2 9/29 >> negative  Urine 9/29 >> UA few bacteria, WBC, RBC, CaOxalate, and Mucus Covid, Flu A/B 9/29 >> negative Tracheal aspirate 10/5 >> normal flora   Antimicrobials:  Vancomycin 9/29 >> 9/30 Cefepime 9/29 >> 9/30 Cefepime 10/5 >> 10/11  Interim history/subjective:  No acute events overnight. Hypotensive, SBP 96-113. UOP 420cc overnight.   Objective   Blood pressure 109/66, pulse (!) 52, temperature 98.6 F (37 C), temperature source Axillary, resp. rate 20, height 5\' 6"  (1.676 m), weight 74.9 kg, SpO2 100 %.    Vent Mode: PRVC FiO2 (%):  [30 %] 30 % Set Rate:  [20 bmp] 20 bmp Vt Set:  [510 mL] 510 mL PEEP:  [5 cmH20] 5 cmH20 Plateau Pressure:  [15 cmH20-17 cmH20] 15 cmH20   Intake/Output Summary (Last 24 hours) at 11/14/2019 1117 Last data filed at 11/14/2019 0800 Gross per 24 hour  Intake 979.83 ml  Output 340 ml  Net 639.83 ml   Filed Weights   11/11/19  0500 11/12/19 0421 11/13/19 0500  Weight: 76.1 kg 76.8 kg 74.9 kg    Examination:  General: critically ill appearing 54 yr old male, intubated HEENT: NCAT, no scleral icterus CV: S1 and s2 present, RRR PULM: CTAB, breathing synchronously on vent  GI: non distended,  soft, bowel sounds present Extremities: no peripheral edema, clubbing or cyanosis  Skin: warm and dry  CXR 10/9 >> ETT in good position, NGT in good position, mild bibasilar airspace disease  Resolved Hospital Problem list     Assessment & Plan:   Myoclonic seizures, 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 No bystander CPR, unknown downtime. 2 rounds CPR/epi with EMS prior to ROSC. EKG normal sinus rhythm with peaked T waves, K 4.2, Echo normal without wall abnormality. Arctic sun (normothermia) stopped 10/3. Continues to be euvolemic on exam -D/c Amlodipine and Lopressor due to hypotension  -Continue Lisinopril 10 mg  -Continue to hold Lasix   Acute Hypoxic Respiratory Failure Resp failure requiring airway protection intubation due to post-arrest myoclonic seizures, LUL infiltrates noted on CT and CXR in setting of ETOH hx and Hx of HIV. S/p Vancomycin (9/29-9/30). Cefipime (9/29-9/30, 10/5-10/11). Negative tracheal aspirates.  -PRVC 8cc/kg  -VAP protocol -Daily SBT / WUA, mental status remains barrier to extubation / weaning -Continue Cefepime, ends tonight, stop date added   Hypokalemia, resolved  -Daily BMP -Replace as indicated   Hx ETOH abuse  -Continue thiamine, folate -Sedation as above   HIV, AIDs Reportedly not taking meds. CD4 124 -Started on Bactrim for PCP prophylaxis on 10/11 -Restarted home meds: Descovy and Tivicay on 10/11  At Risk Malnutrition  Nausea / Vomiting  KUB negative on 10/7, absence of gas.  Small amt liquid stool on exam 10/8.  -TF per Nutrition, restart trickle feeds  today -Continue miralax -Pending neuro prognostication, may need cortrak post pyloric   Large left hydrocele, left inguinal hernia Testicular 12/8 on 10/10 showed large left hydrocele and left inguinal hernia. No evidence of testicular mass. -Monitor left scrotal swelling  Best practice:  Diet: Trickle TF Pain/Anxiety/Delirium protocol (if indicated): PAD protocol  VAP protocol (if indicated): ordered DVT prophylaxis: SQ heparin  GI prophylaxis: PPI Glucose control: n/a Mobility: BR Code Status: full code Family Communication: Will update daughter today Disposition: ICU  Labs   CBC: Recent Labs  Lab 11/08/19 0302 11/09/19 0354 11/10/19 0438 11/11/19 0555 11/14/19 0920  WBC 6.6 7.2 7.0 8.8 3.8*  NEUTROABS  --   --   --   --  2.6  HGB 11.7* 12.3* 12.5* 13.5 11.8*  HCT 36.5* 38.6* 39.0 43.4 37.9*  MCV 95.8 96.5 94.9 96.7 98.2  PLT 168 229 252 256 248    Basic Metabolic Panel: Recent Labs  Lab 11/08/19 0302 11/09/19 0354 11/10/19 0438 11/11/19 0555 11/14/19 0920  NA 138 142 141 143 144  K 3.7 3.5 3.5 3.5 3.3*  CL 104 107 105 103 114*  CO2 25 24 24 26  19*  GLUCOSE 118* 138* 115* 100* 107*  BUN 12 18 19  25* 19  CREATININE 0.54* 0.60* 0.59* 0.83 0.78  CALCIUM 8.9 9.1 10.0 10.6* 9.2  MG 2.2  --  2.3  --   --  GFR: Estimated Creatinine Clearance: 95.3 mL/min (by C-G formula based on SCr of 0.78 mg/dL). Recent Labs  Lab 11/08/19 0302 11/08/19 0302 11/09/19 0354 11/10/19 0438 11/11/19 0555 11/14/19 0920  PROCALCITON <0.10  --  0.11  --   --   --   WBC 6.6   < > 7.2 7.0 8.8 3.8*   < > = values in this interval not displayed.    Liver Function Tests: Recent Labs  Lab 11/08/19 0302 11/10/19 0438 11/11/19 0555 11/14/19 0920  AST 33 32 43* 46*  ALT 22 19 20 24   ALKPHOS 77 77 95 115  BILITOT 0.6 0.8 0.7 0.3  PROT 6.3* 6.9 7.9 6.7  ALBUMIN 2.6* 2.7* 3.0* 2.8*   No results for input(s): LIPASE, AMYLASE in the last 168 hours. No results for input(s):  AMMONIA in the last 168 hours.  ABG    Component Value Date/Time   PHART 7.408 11/02/2019 0505   PCO2ART 38.7 11/02/2019 0505   PO2ART 103 11/02/2019 0505   HCO3 24.4 11/02/2019 0505   TCO2 26 11/02/2019 0505   ACIDBASEDEF 7.0 (H) 11/01/2019 2118   O2SAT 98.0 11/02/2019 0505     Coagulation Profile: No results for input(s): INR, PROTIME in the last 168 hours.  Cardiac Enzymes: No results for input(s): CKTOTAL, CKMB, CKMBINDEX, TROPONINI in the last 168 hours.  HbA1C: No results found for: HGBA1C  CBG: Recent Labs  Lab 11/13/19 1555 11/13/19 1910 11/13/19 2315 11/14/19 0315 11/14/19 0753  GLUCAP 93 88 96 93 101*   CC time:   01/14/20 MD PGY-2, Resident    See [place] for Pager Details

## 2019-11-14 NOTE — Progress Notes (Signed)
Transported pt to CT 2 on vent. Pt stable through out with no complications VS within normal limits.

## 2019-11-14 NOTE — Consult Note (Signed)
[facility] Surgery Consult Note  [place] 1965-12-07  [id number].    Requesting MD: Cheri Fowler Chief Complaint: Found down; PEA arrest with CPR/Epinepherine Reason for Consult: incarcerated RIH  HPI:  The patient is a 54 year old male with a history of HIV, and alcohol abuse who was found down on 11/01/2019.  He was brought to the ED after 2 rounds of CPR for PEA; by EMS with return of circulation in the field.  He was admitted in the emergency department to critical care medicine.  He is experience myoclonic seizures and altered mental status.  He has had acute hypoxic respiratory failure requiring intubation, left upper lobe infiltrates also been noted on x-ray.  He has now been diagnosed with anoxic brain injury from his PEA arrest.  He remains on full mechanical ventilation, and will require tracheostomy tentatively for tomorrow.  Critical care medicine was recommending a PEG placement also.  The patient's daughter was updated and would like to proceed with both PEG tube placement and tracheostomy.  A CT of the abdomen pelvis without contrast was obtained for PEG placement today.  The patient has both left and right inguinal hernias.  The left inguinal hernia containing sigmoid colon and fluid no evidence of colonic obstruction.  The right inguinal hernia has inflammatory changes there is wall thickening in the anterior urinary bladder likely associated with right inguinal hernia and mild distention of small bowel loops containing fluid up to 3.6 cm in diameter.  This was concerning for incarcerated hernia we are asked to see.  His daughter tell me he was scheduled for an [place] in [place] at one of the [employer].  She had taken him to a surgeon there for his hernias before he had this event.  He was drinking daily before the arrest.     ROS: Avoided like to play Review of Systems  Unable to perform ROS: Other  Constitutional:       Anoxic brain injury, intubated with  Acute hypoxic respiratory failure.    Family History  Problem Relation Age of Onset  . Hypertension Mother   . Throat cancer Mother   . Lung cancer Father   . Cancer Maternal Grandfather     Past Medical History:  Diagnosis Date  . Alcohol abuse   . HIV (human immunodeficiency virus infection) (HCC) 1995    Past Surgical History:  Procedure Laterality Date  . NO PAST SURGERIES      Social History:  reports that he has never smoked. He has never used smokeless tobacco. He reports current alcohol use. He reports that he does not use drugs.  Allergies:  Allergies  Allergen Reactions  . Tomato Hives    Medications Prior to Admission  Medication Sig Dispense Refill  . bictegravir-emtricitabine-tenofovir AF (BIKTARVY) 50-200-25 MG TABS tablet Take 1 tablet by mouth daily. (Patient not taking: Reported on 11/01/2019) 30 tablet 5  . erythromycin ophthalmic ointment Place a 1/2 inch ribbon of ointment into the lower eyelid. (Patient not taking: Reported on 02/18/2018) 1 g 0  . HYDROcodone-acetaminophen (NORCO/VICODIN) 5-325 MG per tablet Take 1-2 tablets by mouth every 6 (six) hours as needed for pain. (Patient not taking: Reported on 02/18/2018) 17 tablet 0  . oxyCODONE-acetaminophen (PERCOCET/ROXICET) 5-325 MG tablet Take 1 tablet by mouth every 4 (four) hours as needed for severe pain. (Patient not taking: Reported on 02/18/2018) 10 tablet 0  . potassium chloride SA (KLOR-CON) 20 MEQ tablet Take 1 tablet (20 mEq total) by mouth daily. (Patient  not taking: Reported on 11/01/2019) 3 tablet 0    Blood pressure (!) 148/85, pulse (!) 58, temperature 98.4 F (36.9 C), temperature source Axillary, resp. rate 20, height 5\' 6"  (1.676 m), weight 74.9 kg, SpO2 100 %. Physical Exam:  General: 54 y/o AA male intubated and on the Ventilator with OG also in place HEENT: head is normocephalic, atraumatic.  Sclera are noninjected.   pupils  are equal but not symmetrical. Ears and nose without any  masses or lesions.  Mouth is pink and moist Heart: regular, rate, and rhythm.  Normal s1,s2. No obvious murmurs, gallops, or rubs noted.  Palpable radial and pedal pulses bilaterally Lungs: CTAB, no wheezes, rhonchi, or rales noted.  Respiratory effort nonlabored Abd: soft, NT, ND, +BS, no masses, hernias, or organomegaly.  He has a large scrotum with bowel in the scrotum.  I can't tell if he has any tenderness or discomfort with exam.  MS: all 4 extremities are symmetrical with no cyanosis, clubbing, or edema. Skin: warm and dry with no masses, lesions, or rashes Neuro: Cranial nerves 2-12 grossly intact, sensation is normal throughout Psych: A&Ox3 with an appropriate affect.   Results for orders placed or performed during the hospital encounter of 11/01/19 (from the past 48 hour(s))  Glucose, capillary     Status: None   Collection Time: 11/12/19  3:43 PM  Result Value Ref Range   Glucose-Capillary 90 70 - 99 mg/dL    Comment: Glucose reference range applies only to samples taken after fasting for at least 8 hours.  Glucose, capillary     Status: None   Collection Time: 11/12/19  8:49 PM  Result Value Ref Range   Glucose-Capillary 91 70 - 99 mg/dL    Comment: Glucose reference range applies only to samples taken after fasting for at least 8 hours.  Glucose, capillary     Status: None   Collection Time: 11/12/19 11:31 PM  Result Value Ref Range   Glucose-Capillary 94 70 - 99 mg/dL    Comment: Glucose reference range applies only to samples taken after fasting for at least 8 hours.  Glucose, capillary     Status: None   Collection Time: 11/13/19  3:56 AM  Result Value Ref Range   Glucose-Capillary 93 70 - 99 mg/dL    Comment: Glucose reference range applies only to samples taken after fasting for at least 8 hours.   Comment 1 Notify RN   Urine rapid drug screen (hosp performed)     Status: Abnormal   Collection Time: 11/13/19  5:50 AM  Result Value Ref Range   Opiates NONE DETECTED  NONE DETECTED   Cocaine NONE DETECTED NONE DETECTED   Benzodiazepines POSITIVE (A) NONE DETECTED   Amphetamines NONE DETECTED NONE DETECTED   Tetrahydrocannabinol NONE DETECTED NONE DETECTED   Barbiturates NONE DETECTED NONE DETECTED    Comment: (NOTE) DRUG SCREEN FOR MEDICAL PURPOSES ONLY.  IF CONFIRMATION IS NEEDED FOR ANY PURPOSE, NOTIFY LAB WITHIN 5 DAYS.  LOWEST DETECTABLE LIMITS FOR URINE DRUG SCREEN Drug Class                     Cutoff (ng/mL) Amphetamine and metabolites    1000 Barbiturate and metabolites    200 Benzodiazepine                 200 Tricyclics and metabolites     300 Opiates and metabolites        300 Cocaine and metabolites  300 THC                            50 Performed at [facility] Lab, [address] [place], [place] [address]   Glucose, capillary     Status: None   Collection Time: 11/13/19  7:27 AM  Result Value Ref Range   Glucose-Capillary 75 70 - 99 mg/dL    Comment: Glucose reference range applies only to samples taken after fasting for at least 8 hours.  Glucose, capillary     Status: None   Collection Time: 11/13/19 11:48 AM  Result Value Ref Range   Glucose-Capillary 87 70 - 99 mg/dL    Comment: Glucose reference range applies only to samples taken after fasting for at least 8 hours.  Glucose, capillary     Status: None   Collection Time: 11/13/19  3:55 PM  Result Value Ref Range   Glucose-Capillary 93 70 - 99 mg/dL    Comment: Glucose reference range applies only to samples taken after fasting for at least 8 hours.  Glucose, capillary     Status: None   Collection Time: 11/13/19  7:10 PM  Result Value Ref Range   Glucose-Capillary 88 70 - 99 mg/dL    Comment: Glucose reference range applies only to samples taken after fasting for at least 8 hours.  Glucose, capillary     Status: None   Collection Time: 11/13/19 11:15 PM  Result Value Ref Range   Glucose-Capillary 96 70 - 99 mg/dL    Comment: Glucose reference range  applies only to samples taken after fasting for at least 8 hours.  Glucose, capillary     Status: None   Collection Time: 11/14/19  3:15 AM  Result Value Ref Range   Glucose-Capillary 93 70 - 99 mg/dL    Comment: Glucose reference range applies only to samples taken after fasting for at least 8 hours.  Glucose, capillary     Status: Abnormal   Collection Time: 11/14/19  7:53 AM  Result Value Ref Range   Glucose-Capillary 101 (H) 70 - 99 mg/dL    Comment: Glucose reference range applies only to samples taken after fasting for at least 8 hours.  CBC with Differential/Platelet     Status: Abnormal   Collection Time: 11/14/19  9:20 AM  Result Value Ref Range   WBC 3.8 (L) 4.0 - 10.5 K/uL   RBC 3.86 (L) 4.22 - 5.81 MIL/uL   Hemoglobin 11.8 (L) 13.0 - 17.0 g/dL   HCT 60.4 (L) 39 - 52 %   MCV 98.2 80.0 - 100.0 fL   MCH 30.6 26.0 - 34.0 pg   MCHC 31.1 30.0 - 36.0 g/dL   RDW 54.0 98.1 - 19.1 %   Platelets 248 150 - 400 K/uL   nRBC 0.0 0.0 - 0.2 %   Neutrophils Relative % 68 %   Neutro Abs 2.6 1.7 - 7.7 K/uL   Lymphocytes Relative 12 %   Lymphs Abs 0.5 (L) 0.7 - 4.0 K/uL   Monocytes Relative 12 %   Monocytes Absolute 0.5 0.1 - 1.0 K/uL   Eosinophils Relative 2 %   Eosinophils Absolute 0.1 0.0 - 0.5 K/uL   Basophils Relative 1 %   Basophils Absolute 0.1 0.0 - 0.1 K/uL   Immature Granulocytes 5 %   Abs Immature Granulocytes 0.18 (H) 0.00 - 0.07 K/uL    Comment: Performed at [facility], [address] [place], [place] [address]  Comprehensive metabolic panel     Status: Abnormal   Collection Time: 11/14/19  9:20 AM  Result Value Ref Range   Sodium 144 135 - 145 mmol/L   Potassium 3.3 (L) 3.5 - 5.1 mmol/L   Chloride 114 (H) 98 - 111 mmol/L   CO2 19 (L) 22 - 32 mmol/L   Glucose, Bld 107 (H) 70 - 99 mg/dL    Comment: Glucose reference range applies only to samples taken after fasting for at least 8 hours.   BUN 19 6 - 20 mg/dL   Creatinine, Ser 6.040.78 0.61 - 1.24 mg/dL    Calcium 9.2 8.9 - 54.010.3 mg/dL   Total Protein 6.7 6.5 - 8.1 g/dL   Albumin 2.8 (L) 3.5 - 5.0 g/dL   AST 46 (H) 15 - 41 U/L   ALT 24 0 - 44 U/L   Alkaline Phosphatase 115 38 - 126 U/L   Total Bilirubin 0.3 0.3 - 1.2 mg/dL   GFR, Estimated >98>60 >11>60 mL/min   Anion gap 11 5 - 15    Comment: Performed at [facility], [address] [place], [place] [address]  Glucose, capillary     Status: Abnormal   Collection Time: 11/14/19 11:29 AM  Result Value Ref Range   Glucose-Capillary 103 (H) 70 - 99 mg/dL    Comment: Glucose reference range applies only to samples taken after fasting for at least 8 hours.   MR BRAIN WO CONTRAST  Result Date: 11/13/2019 CLINICAL DATA:  Mental status change, alcohol/drug use. Additional history provided: [age] year old male with HIV/aids and alcohol abuse status post PEA arrest. EXAM: MRI HEAD WITHOUT CONTRAST TECHNIQUE: Multiplanar, multiecho pulse sequences of the brain and surrounding structures were obtained without intravenous contrast. COMPARISON:  Brain MRI 11/05/2019. FINDINGS: Brain: Mild intermittent motion degradation. Stable age-advanced cerebral and cerebellar atrophy. Subtle T2/FLAIR hyperintense signal abnormality throughout much of the cerebellum is questioned. Subtle multifocal T2/FLAIR hyperintense signal abnormality is also questioned within the cerebral cortex. These findings are similar as compared to the prior MRI of 11/05/2019. The previously described symmetric basal ganglia signal abnormality has become less conspicuous. Redemonstrated prominent perivascular space within the inferior right basal ganglia. No evidence of intracranial mass. No chronic intracranial blood products. No extra-axial fluid collection. No midline shift. Vascular: Expected proximal arterial flow voids. Diminutive posterior circulation, possibly developmental. Skull and upper cervical spine: No focal marrow lesion. Sinuses/Orbits: Visualized orbits show no acute finding. Mild  ethmoid and maxillary sinus mucosal thickening. Secretions within the posterior nasopharynx. Partially imaged life support tubes. Trace fluid within right mastoid air cells. IMPRESSION: As before, subtle T2/FLAIR hyperintense signal abnormality is questioned throughout much of the cerebellum and at multiple sites within the bilateral cerebral cortex. The previously described symmetric basal ganglia signal abnormality has become less conspicuous. Given the provided history of recent PEA arrest, these findings are suspicious for evolving sequela of hypoxic/ischemic injury. No intracranial mass effect or herniation. Electronically Signed   By: Jackey LogeKyle  Golden DO   On: 11/13/2019 15:40   US SCROTUM  Result Date: 11/12/2019 CLINICAL DATA:  Left scrotal swelling.  Patient unresponsive. EXAM: ULTRASOUND OF SCROTUM TECHNIQUE: Complete ultrasound examination of the testicles, epididymis, and other scrotal structures was performed. COMPARISON:  None. FINDINGS: Right testicle Measurements: 3.5 x 1.7 x 2.1 cm. No mass or microlithiasis visualized. Internal blood flow is seen on color Doppler ultrasound. Left testicle Measurements: 3.8 x 1.6 x 2.4 cm. No mass or microlithiasis visualized.  Internal blood flow is seen on color Doppler ultrasound. Right epididymis:  Normal in size and appearance. Left epididymis:  Normal in size and appearance. Hydrocele: A large left-sided hydrocele and left inguinal hernia are seen. Varicocele:  None visualized. IMPRESSION: Large left hydrocele and left inguinal hernia. No evidence of testicular mass. Electronically Signed   By: Danae Orleans M.D.   On: 11/12/2019 20:37   . sodium chloride 10 mL/hr at 11/14/19 1200  . feeding supplement (VITAL AF 1.2 CAL)    . lactated ringers 115 mL/hr at 11/14/19 1219  . levETIRAcetam Stopped (11/14/19 1010)   Anti-infectives (From admission, onward)   Start     Dose/Rate Route Frequency Ordered Stop   11/15/19 0900  sulfamethoxazole-trimethoprim  (BACTRIM DS) 800-160 MG per tablet 1 tablet        1 tablet Per Tube Once per day on Mon Wed Fri 11/13/19 1906     11/13/19 1615  dolutegravir (TIVICAY) tablet 50 mg        50 mg Oral Daily 11/13/19 1521     11/13/19 1615  emtricitabine-tenofovir AF (DESCOVY) 200-25 MG per tablet 1 tablet        1 tablet Per Tube Daily 11/13/19 1521     11/13/19 1530  sulfamethoxazole-trimethoprim (BACTRIM DS) 800-160 MG per tablet 1 tablet  Status:  Discontinued        1 tablet Oral Once per day on Mon Wed Fri 11/13/19 1441 11/13/19 1906   11/13/19 1500  bictegravir-emtricitabine-tenofovir AF (BIKTARVY) 50-200-25 MG per tablet 1 tablet  Status:  Discontinued        1 tablet Oral Daily 11/13/19 1403 11/13/19 1521   11/10/19 1000  erythromycin 250 mg in sodium chloride 0.9 % 100 mL IVPB        250 mg 100 mL/hr over 60 Minutes Intravenous Every 8 hours 11/10/19 0907 11/11/19 2000   11/07/19 1200  ceFEPIme (MAXIPIME) 2 g in sodium chloride 0.9 % 100 mL IVPB        2 g 200 mL/hr over 30 Minutes Intravenous Every 8 hours 11/07/19 1013 11/13/19 2127   11/03/19 1200  cefTRIAXone (ROCEPHIN) 2 g in sodium chloride 0.9 % 100 mL IVPB        2 g 200 mL/hr over 30 Minutes Intravenous Every 24 hours 11/03/19 0922 11/05/19 1427   11/02/19 0800  vancomycin (VANCOREADY) IVPB 750 mg/150 mL  Status:  Discontinued        750 mg 150 mL/hr over 60 Minutes Intravenous Every 12 hours 11/01/19 1935 11/02/19 1105   11/02/19 0600  piperacillin-tazobactam (ZOSYN) IVPB 3.375 g  Status:  Discontinued        3.375 g 12.5 mL/hr over 240 Minutes Intravenous Every 8 hours 11/02/19 0311 11/03/19 0920   11/01/19 2200  ceFEPIme (MAXIPIME) 2 g in sodium chloride 0.9 % 100 mL IVPB  Status:  Discontinued        2 g 200 mL/hr over 30 Minutes Intravenous Every 12 hours 11/01/19 2143 11/02/19 0311   11/01/19 1915  vancomycin (VANCOREADY) IVPB 1500 mg/300 mL        1,500 mg 150 mL/hr over 120 Minutes Intravenous  Once 11/01/19 1909 11/01/19 2147    11/01/19 1845  cefTRIAXone (ROCEPHIN) 1 g in sodium chloride 0.9 % 100 mL IVPB        1 g 200 mL/hr over 30 Minutes Intravenous  Once 11/01/19 1842 11/01/19 2051   11/01/19 1845  azithromycin (ZITHROMAX) 500 mg in sodium chloride 0.9 %  250 mL IVPB        500 mg 250 mL/hr over 60 Minutes Intravenous  Once 11/01/19 1842 11/01/19 2051       Assessment/Plan HIV PEA arrest Acute hypoxic respiratory failure Anoxic encephalopathy Alcohol abuse  Bilateral inguinal hernias with question of RIH with obstruction  FEN: Tube feeding with volume just increased to 58ml/hr.  30 ml residual having stools, but just received colace and Miralax ID: [email or web address]/Vancomycin 9/29; Maxipime 9/29-10/11 DVT: heparin  Plan:  He does not appear obstructed on exam, but I can't be sure.  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  He is tolerating TF, and has been having BM's.  Will do small bowel protocol on him and follow with you.       Sherrie George [facility] Surgery 11/14/2019, 3:05 PM Please see Amion for pager number during day hours 7:00am-4:30pm

## 2019-11-14 NOTE — Progress Notes (Signed)
Nutrition Follow Up  DOCUMENTATION CODES:   Not applicable  INTERVENTION:   Plan trach and PEG tomorrow  Increase tube feeding to goal: -Vital AF 1.2 @ 55 ml/hr via OG (1320 ml) -45 ml ProSource TID  Provides: 1704 kcals, 132 grams protein, 1071 ml free water.   NUTRITION DIAGNOSIS:   Increased nutrient needs related to acute illness as evidenced by estimated needs.  Ongoing  GOAL:   Patient will meet greater than or equal to 90% of their needs   Addressed via TF  MONITOR:   Vent status, Skin, TF tolerance, Weight trends, Labs, I & O's  REASON FOR ASSESSMENT:   Ventilator, Consult Enteral/tube feeding initiation and management  ASSESSMENT:   Patient with PMH significant for HIV and ETOH use. Presents this admission with cardiac arrest and suspected aspiration in setting of heavy ETOH use.   Pt discussed during ICU rounds and with RN.   Repeat brain MRI showed evolving hypoxic brain injury. Pt had recurring vomiting episode on 10/7. KUB was negative. Prokinetic agent provided, pt now tolerating Vital AF 1.2 @ 40 ml/hr. Increase to goal today. Plan for tracheostomy and PEG tomorrow. Consider transition to standard formula at next follow up.   Admission weight: 78.9 kg  Current weight: 74.9 kg   Patient remains intubated on ventilator support MV: 7.8 L/min Temp (24hrs), Avg:98.6 F (37 C), Min:97.8 F (36.6 C), Max:99.4 F (37.4 C)  UOP: 420 ml x 24 hrs  Stool: 50 ml x 24 hrs   Drips: LR @ 115 ml/hr  Medications: folic acid, miralax, thiamine  Labs: K 3.3 (L) CBG 87-103  Diet Order:   Diet Order            Diet NPO time specified  Diet effective now                 EDUCATION NEEDS:   Not appropriate for education at this time  Skin:  Skin Assessment: Skin Integrity Issues: Skin Integrity Issues:: Stage III Stage III: anus  Last BM:  10/11 via rectal tube  Height:   Ht Readings from Last 1 Encounters:  11/01/19 5\' 6"  (1.676 m)     Weight:   Wt Readings from Last 1 Encounters:  11/13/19 74.9 kg    BMI:  Body mass index is 26.65 kg/m.  Estimated Nutritional Needs:   Kcal:  1682 kcal  Protein:  115-130 g  Fluid:  >/= 1.6 L/day   01/13/20 RD, LDN Clinical Nutrition Pager listed in AMION

## 2019-11-15 ENCOUNTER — Inpatient Hospital Stay (HOSPITAL_COMMUNITY): Payer: Medicaid Other

## 2019-11-15 LAB — CBC
HCT: 36.5 % — ABNORMAL LOW (ref 39.0–52.0)
Hemoglobin: 11.5 g/dL — ABNORMAL LOW (ref 13.0–17.0)
MCH: 30.3 pg (ref 26.0–34.0)
MCHC: 31.5 g/dL (ref 30.0–36.0)
MCV: 96.3 fL (ref 80.0–100.0)
Platelets: 219 10*3/uL (ref 150–400)
RBC: 3.79 MIL/uL — ABNORMAL LOW (ref 4.22–5.81)
RDW: 13.2 % (ref 11.5–15.5)
WBC: 3.8 10*3/uL — ABNORMAL LOW (ref 4.0–10.5)
nRBC: 0 % (ref 0.0–0.2)

## 2019-11-15 LAB — GLUCOSE, CAPILLARY
Glucose-Capillary: 91 mg/dL (ref 70–99)
Glucose-Capillary: 79 mg/dL (ref 70–99)
Glucose-Capillary: 99 mg/dL (ref 70–99)
Glucose-Capillary: 100 mg/dL — ABNORMAL HIGH (ref 70–99)
Glucose-Capillary: 97 mg/dL (ref 70–99)
Glucose-Capillary: 102 mg/dL — ABNORMAL HIGH (ref 70–99)

## 2019-11-15 LAB — COMPREHENSIVE METABOLIC PANEL
ALT: 26 U/L (ref 0–44)
AST: 45 U/L — ABNORMAL HIGH (ref 15–41)
Albumin: 2.8 g/dL — ABNORMAL LOW (ref 3.5–5.0)
Alkaline Phosphatase: 125 U/L (ref 38–126)
Anion gap: 8 (ref 5–15)
BUN: 15 mg/dL (ref 6–20)
CO2: 21 mmol/L — ABNORMAL LOW (ref 22–32)
Calcium: 9.4 mg/dL (ref 8.9–10.3)
Chloride: 116 mmol/L — ABNORMAL HIGH (ref 98–111)
Creatinine, Ser: 0.68 mg/dL (ref 0.61–1.24)
GFR, Estimated: >60 mL/min (ref >60)
Glucose, Bld: 100 mg/dL — ABNORMAL HIGH (ref 70–99)
Potassium: 3.5 mmol/L (ref 3.5–5.1)
Sodium: 145 mmol/L (ref 135–145)
Total Bilirubin: 0.5 mg/dL (ref 0.3–1.2)
Total Protein: 6.7 g/dL (ref 6.5–8.1)

## 2019-11-15 MED ORDER — LIDOCAINE HCL (PF) 2 % IJ SOLN: 0.0000 mL | Freq: Once | INTRAMUSCULAR | Status: DC | PRN

## 2019-11-15 MED ORDER — HYDRALAZINE HCL 20 MG/ML IJ SOLN
10.0000 mg | Freq: Once | INTRAMUSCULAR | Status: AC
  Administered 2019-11-15: 10 mg via INTRAVENOUS
  Filled 2019-11-15: qty 1

## 2019-11-15 MED ORDER — MIDAZOLAM HCL 2 MG/2ML IJ SOLN
2.0000 mg | Freq: Once | INTRAMUSCULAR | Status: AC
  Administered 2019-11-15: 2 mg via INTRAVENOUS
  Filled 2019-11-15: qty 2

## 2019-11-15 MED ORDER — AMLODIPINE BESYLATE 10 MG PO TABS
10.0000 mg | ORAL_TABLET | Freq: Every day | ORAL | Status: DC
  Administered 2019-11-15 – 2020-04-08 (×143): 10 mg
  Filled 2019-11-15 (×146): qty 1

## 2019-11-15 MED ORDER — POTASSIUM CHLORIDE 10 MEQ/100ML IV SOLN
10.0000 meq | INTRAVENOUS | Status: AC
  Administered 2019-11-15 (×4): 10 meq via INTRAVENOUS
  Filled 2019-11-15 (×4): qty 100

## 2019-11-15 MED ORDER — SODIUM CHLORIDE 0.9 % IV SOLN: INTRAVENOUS | Status: DC | PRN

## 2019-11-15 MED ORDER — FUROSEMIDE 10 MG/ML IJ SOLN
20.0000 mg | Freq: Once | INTRAMUSCULAR | Status: AC
  Administered 2019-11-15: 20 mg via INTRAVENOUS
  Filled 2019-11-15: qty 2

## 2019-11-15 MED ORDER — LIDOCAINE-EPINEPHRINE (PF) 1 %-1:200000 IJ SOLN
0.0000 mL | Freq: Once | INTRAMUSCULAR | Status: DC | PRN
  Filled 2019-11-15: qty 30

## 2019-11-15 MED ORDER — VECURONIUM BROMIDE 10 MG IV SOLR
10.0000 mg | Freq: Once | INTRAVENOUS | Status: AC
  Administered 2019-11-15: 10 mg via INTRAVENOUS
  Filled 2019-11-15: qty 10

## 2019-11-15 MED ORDER — MIDAZOLAM HCL 2 MG/2ML IJ SOLN
INTRAMUSCULAR | Status: AC
  Filled 2019-11-15: qty 2

## 2019-11-15 MED ORDER — ETOMIDATE 2 MG/ML IV SOLN
20.0000 mg | Freq: Once | INTRAVENOUS | Status: AC
  Administered 2019-11-15: 20 mg via INTRAVENOUS
  Filled 2019-11-15: qty 10

## 2019-11-15 MED ORDER — MIDAZOLAM HCL 2 MG/2ML IJ SOLN
INTRAMUSCULAR | Status: AC
  Administered 2019-11-15: 2 mg
  Filled 2019-11-15: qty 2

## 2019-11-15 MED ORDER — DEXTROSE IN LACTATED RINGERS 5 % IV SOLN: INTRAVENOUS | Status: DC

## 2019-11-15 MED ORDER — FENTANYL CITRATE (PF) 100 MCG/2ML IJ SOLN
50.0000 ug | Freq: Once | INTRAMUSCULAR | Status: AC
  Administered 2019-11-15: 50 ug via INTRAVENOUS
  Filled 2019-11-15: qty 2

## 2019-11-15 MED ORDER — FENTANYL CITRATE (PF) 100 MCG/2ML IJ SOLN
100.0000 ug | Freq: Once | INTRAMUSCULAR | Status: AC
  Administered 2019-11-15: 100 ug via INTRAVENOUS
  Filled 2019-11-15: qty 2

## 2019-11-15 NOTE — Procedures (Signed)
Diagnostic Bronchoscopy  [facility]  [id number]  [birth year]  Date:11/15/19  Time:2:24 PM   Provider Performing:Rasean Joos C Warren Kugelman   Procedure: Diagnostic Bronchoscopy (57972)  Indication(s) Assist with direct visualization of tracheostomy placement  Consent Risks of the procedure as well as the alternatives and risks of each were explained to the patient and/or caregiver.  Consent for the procedure was obtained.   Anesthesia See separate tracheostomy note   Time Out Verified patient identification, verified procedure, site/side was marked, verified correct patient position, special equipment/implants available, medications/allergies/relevant history reviewed, required imaging and test results available.   Sterile Technique Usual hand hygiene, masks, gowns, and gloves were used   Procedure Description Bronchoscope advanced through endotracheal tube and into airway.  After suctioning out tracheal secretions, bronchoscope used to provide direct visualization of tracheostomy placement.   Complications/Tolerance None; patient tolerated the procedure well.   EBL None  Specimen(s) None

## 2019-11-15 NOTE — Procedures (Signed)
Percutaneous Tracheostomy Procedure Note   Kishawn [facility]  [id number]  [birth year]  Date:11/15/19  Time:2:35 PM   Provider Performing:Jadaya Sommerfield  Procedure: Percutaneous Tracheostomy with Bronchoscopic Guidance (90383)  Indication(s) Acute Hypoxic respiratory failure  Consent Risks of the procedure as well as the alternatives and risks of each were explained to the patient and/or caregiver.  Consent for the procedure was obtained.  Anesthesia Etomidate, Versed, Fentanyl, Vecuronium   Time Out Verified patient identification, verified procedure, site/side was marked, verified correct patient position, special equipment/implants available, medications/allergies/relevant history reviewed, required imaging and test results available.   Sterile Technique Maximal sterile technique including sterile barrier drape, hand hygiene, sterile gown, sterile gloves, mask, hair covering.    Procedure Description Appropriate anatomy identified by palpation.  Patient's neck prepped and draped in sterile fashion.  1% lidocaine with epinephrine was used to anesthetize skin overlying neck.  1.5cm incision made and blunt dissection performed until tracheal rings could be easily palpated.   Then a size 8 Shiley tracheostomy was placed under bronchoscopic visualization using usual Seldinger technique and serial dilation.   Bronchoscope confirmed placement above the carina.  Tracheostomy was sutured in place with adhesive pad to protect skin under pressure.    Patient connected to ventilator.   Complications/Tolerance None; patient tolerated the procedure well. Chest X-ray is ordered to confirm no post-procedural complication.   EBL Minimal   Specimen(s) None

## 2019-11-15 NOTE — Consult Note (Signed)
Chief Complaint: Patient was seen in consultation today for dysphasia/percutaneous gastrostomy tube placement.  Referring Physician(s): Cheri Fowler (CCM)  Supervising Physician: Irish Lack  Patient Status: [facility] - In-pt  History of Present Illness: Ryan Orozco is a 54 y.o. male with a past medical history of HIV/AIDS and alcohol abuse. He has unfortunately been admitted to [facility] since 11/01/2019 for management of cardiac arrest with associated acute hypoxic respiratory failure and AMS. Per notes, patient was found down outside a convenience store pulseless (unknown downtime prior to EMS arrival). EMS performed 2 rounds of CPR with 2 epi before ROSC. 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 Due to need for nutritional support, IR consulted for possible percutaneous gastrostomy tube placement. Case/images reviewed by Dr. Lowella Dandy who recommended surgical consult regarding bilateral inguinal incarcerated hernias. Surgery consulted on patient who states hernias are not obstructed, and even if they were patient not a candidate for surgical repair- recommend proceeding with IR percutaneous gastrostomy tube placement.  IR consulted by Dr. Merrily Pew for possible image-guided percutaneous gastrostomy tube placement. Patient awake laying in bed, intubated without sedation. Eyes follow around room, but does not interact/follow simple commands. History difficult to obtain secondary to this. Daughter at bedside.  LD Heparin SQ today at 0512.   Past Medical History:  Diagnosis Date  . Alcohol abuse   . HIV (human immunodeficiency virus infection) (HCC) 1995    Past Surgical History:  Procedure Laterality Date  . NO PAST SURGERIES      Allergies: Tomato  Medications: Prior to Admission medications    Medication Sig Start Date End Date Taking? Authorizing Provider  bictegravir-emtricitabine-tenofovir AF (BIKTARVY) 50-200-25 MG TABS tablet Take 1 tablet by mouth daily. Patient not taking: Reported on 11/01/2019 02/18/18   Blanchard Kelch, NP  erythromycin ophthalmic ointment Place a 1/2 inch ribbon of ointment into the lower eyelid. Patient not taking: Reported on 02/18/2018 04/15/17   Hedges, Tinnie Gens, PA-C  HYDROcodone-acetaminophen (NORCO/VICODIN) 5-325 MG per tablet Take 1-2 tablets by mouth every 6 (six) hours as needed for pain. Patient not taking: Reported on 02/18/2018 06/21/12   Jones Skene, MD  oxyCODONE-acetaminophen (PERCOCET/ROXICET) 5-325 MG tablet Take 1 tablet by mouth every 4 (four) hours as needed for severe pain. Patient not taking: Reported on 02/18/2018 04/15/17   Hedges, Tinnie Gens, PA-C  potassium chloride SA (KLOR-CON) 20 MEQ tablet Take 1 tablet (20 mEq total) by mouth daily. Patient not taking: Reported on 11/01/2019 10/30/19   Pricilla Loveless, MD     Family History  Problem Relation Age of Onset  . Hypertension Mother   . Throat cancer Mother   . Lung cancer Father   . Cancer Maternal Grandfather     Social History   Socioeconomic History  . Marital status: Single    Spouse name: Not on file  . Number of children: Not on file  . Years of education: Not on file  . Highest education level: Not on file  Occupational History  . Not on file  Tobacco Use  . Smoking status: Never Smoker  . Smokeless tobacco: Never Used  Substance and Sexual Activity  . Alcohol use: Yes    Comment: occasionally  . Drug use: No  . Sexual activity: Yes    Partners: Female    Birth control/protection: Condom  Other Topics Concern  . Not on file  Social History  Narrative  . Not on file   Social Determinants of Health   Financial Resource Strain:   . Difficulty of Paying Living Expenses: Not on file  Food Insecurity:   . Worried About Programme researcher, broadcasting/film/video in the Last Year:  Not on file  . Ran Out of Food in the Last Year: Not on file  Transportation Needs:   . Lack of Transportation (Medical): Not on file  . Lack of Transportation (Non-Medical): Not on file  Physical Activity:   . Days of Exercise per Week: Not on file  . Minutes of Exercise per Session: Not on file  Stress:   . Feeling of Stress : Not on file  Social Connections:   . Frequency of Communication with Friends and Family: Not on file  . Frequency of Social Gatherings with Friends and Family: Not on file  . Attends Religious Services: Not on file  . Active Member of Clubs or Organizations: Not on file  . Attends Banker Meetings: Not on file  . Marital Status: Not on file     Review of Systems: A 12 point ROS discussed and pertinent positives are indicated in the HPI above.  All other systems are negative.  Review of Systems  Unable to perform ROS: Other (Intubated; AMS)    Vital Signs: BP 138/76   Pulse (!) 52   Temp 98 F (36.7 C) (Axillary)   Resp 14   Ht 5\' 6"  (1.676 m)   Wt 169 lb 12.1 oz (77 kg)   SpO2 100%   BMI 27.40 kg/m   Physical Exam Vitals and nursing note reviewed.  Constitutional:      General: He is not in acute distress.    Comments: Intubated.  Cardiovascular:     Rate and Rhythm: Regular rhythm. Bradycardia present.     Heart sounds: Normal heart sounds. No murmur heard.   Pulmonary:     Effort: Pulmonary effort is normal. No respiratory distress.     Breath sounds: Normal breath sounds. No wheezing.     Comments: Intubated. Skin:    General: Skin is warm and dry.  Neurological:     Comments: Eyes open/follow around room but does not respond and does not follow simple commands.      MD Evaluation Airway:  (Intubated) Heart: WNL Abdomen: WNL Chest/ Lungs: WNL ASA  Classification: 3 Mallampati/Airway Score: Three (Intubated)   Imaging: CT ABDOMEN PELVIS WO CONTRAST  Result Date: 11/14/2019 CLINICAL DATA:  54 year old with  abdominal distension. Evaluate anatomy prior to percutaneous gastrostomy tube placement. EXAM: CT ABDOMEN AND PELVIS WITHOUT CONTRAST TECHNIQUE: Multidetector CT imaging of the abdomen and pelvis was performed following the standard protocol without IV contrast. COMPARISON:  CT 04/27/2012 FINDINGS: Lower chest: Patchy atelectasis at both lung bases, right side greater than left. No significant pleural fluid. Hepatobiliary: Normal appearance of the liver and gallbladder. Pancreas: Unremarkable. No pancreatic ductal dilatation or surrounding inflammatory changes. Spleen: Normal in size without focal abnormality. Adrenals/Urinary Tract: Normal appearance of the adrenal glands. Negative for kidney stones or hydronephrosis. Wall thickening associated with the anterior urinary bladder and a portion of the anterior urinary bladder may be extending into the right inguinal hernia. No significant bladder distension. Stomach/Bowel: Nasogastric tube terminates in the stomach body. Stomach is not distended. Normal appearance of the duodenum. Fluid-filled dilated loops of small bowel in the abdomen. Small bowel loops measure up to 3.6 cm in the left anterior abdomen on sequence 3, image 52. Distal  small bowel is decompressed. Patient has a right inguinal hernia containing fat and difficult to exclude bowel in this area. There is edema and inflammatory changes associated with the right inguinal hernia and wall thickening of the anterior urinary bladder in this area. Findings raise concern for an incarcerated right inguinal hernia and at least partial small bowel obstruction. Left inguinal hernia contains a portion of the sigmoid colon along with fluid. No significant distension of the colon proximal to the left inguinal hernia. There is a rectal tube with an inflated balloon. Normal gas-filled appendix. Stomach is cephalad to the transverse colon. Vascular/Lymphatic: Normal caliber of the abdominal aorta. Small lymph nodes in the  retroperitoneum. Small inguinal lymph nodes. Reproductive: Prostate is unremarkable. Other: Bilateral inguinal hernias, left side greater than right. Large amount of colon within the left inguinal hernia containing fluid as well. Concern for inflammatory changes associated with the right inguinal hernia. Negative for free air. Small amount of presacral edema. No significant free fluid within the abdomen or pelvis (except for the left inguinal hernia). Musculoskeletal: No acute bone abnormality. IMPRESSION: 1. Right inguinal hernia with inflammatory changes. There is wall thickening of the anterior urinary bladder likely associated with the right inguinal hernia and mild distention of small bowel loops containing fluid. Findings are concerning for an incarcerated right inguinal hernia with at least a partial small bowel obstruction. Recommend surgical consultation. 2. Large left inguinal hernia containing sigmoid colon and fluid. There is no evidence for a colonic obstruction. 3. Transverse colon is caudal to the stomach and the anatomy is amendable for percutaneous gastrostomy tube placement. These results were called by telephone at the time of interpretation on 11/14/2019 at 2:57 pm to provider [facility] , who verbally acknowledged these results. Electronically Signed   By: Richarda Overlie M.D.   On: 11/14/2019 15:08   CT Head Wo Contrast  Result Date: 11/01/2019 CLINICAL DATA:  Poly trauma, critical, head/cervical spine injury suspected. Additional provided: Post arrest, unknown cause. EXAM: CT HEAD WITHOUT CONTRAST CT CERVICAL SPINE WITHOUT CONTRAST TECHNIQUE: Multidetector CT imaging of the head and cervical spine was performed following the standard protocol without intravenous contrast. Multiplanar CT image reconstructions of the cervical spine were also generated. COMPARISON:  CT orbits 04/15/2017. FINDINGS: CT HEAD FINDINGS Brain: Moderate generalized cerebral atrophy, advanced for age. There is no acute  intracranial hemorrhage. No demarcated cortical infarct. No extra-axial fluid collection. No evidence of intracranial mass. No midline shift. Vascular: No hyperdense vessel. Skull: Normal. Negative for fracture or focal lesion. Sinuses/Orbits: Visualized orbits show no acute finding. Chronic medially displaced fracture deformity of the right lamina papyracea. Mild ethmoid and maxillary sinus mucosal thickening at the imaged levels. No significant mastoid effusion. CT CERVICAL SPINE FINDINGS Alignment: Straightening of the expected cervical lordosis. No significant spondylolisthesis. Skull base and vertebrae: The basion-dental and atlanto-dental intervals are maintained.No evidence of acute fracture to the cervical spine. Soft tissues and spinal canal: No prevertebral fluid or swelling. No visible canal hematoma. Disc levels: Mild cervical spondylosis. No high-grade bony spinal canal stenosis or significant bony neural foraminal narrowing. Upper chest: Extensive airspace opacities within the partially imaged left lung apex. Less prominent interstitial and patchy airspace opacities within the imaged right lung apex. Round 0.9 cm lucent focus along the medial aspect of the left lung apex likely reflecting a subpleural cyst. Other: Partially imaged ET tube and enteric tube. IMPRESSION: CT head: 1. No evidence of acute intracranial abnormality. 2. Moderate generalized cerebral atrophy, advanced for age. 3.  Chronic medially displaced fracture deformity of the right lamina papyracea. 4. Mild ethmoid and maxillary sinus mucosal thickening. CT cervical spine: 1. No evidence of acute fracture to the cervical spine. 2. Partially imaged airspace disease within the imaged lung apices (extensive on the left). Clinical correlation is recommended. 3. 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 Electronically Signed   By: Jackey Loge DO   On: 11/01/2019 18:57    CT Cervical Spine Wo Contrast  Result Date: 11/01/2019 CLINICAL DATA:  Poly trauma, critical, head/cervical spine injury suspected. Additional provided: Post arrest, unknown cause. EXAM: CT HEAD WITHOUT CONTRAST CT CERVICAL SPINE WITHOUT CONTRAST TECHNIQUE: Multidetector CT imaging of the head and cervical spine was performed following the standard protocol without intravenous contrast. Multiplanar CT image reconstructions of the cervical spine were also generated. COMPARISON:  CT orbits 04/15/2017. FINDINGS: CT HEAD FINDINGS Brain: Moderate generalized cerebral atrophy, advanced for age. There is no acute intracranial hemorrhage. No demarcated cortical infarct. No extra-axial fluid collection. No evidence of intracranial mass. No midline shift. Vascular: No hyperdense vessel. Skull: Normal. Negative for fracture or focal lesion. Sinuses/Orbits: Visualized orbits show no acute finding. Chronic medially displaced fracture deformity of the right lamina papyracea. Mild ethmoid and maxillary sinus mucosal thickening at the imaged levels. No significant mastoid effusion. CT CERVICAL SPINE FINDINGS Alignment: Straightening of the expected cervical lordosis. No significant spondylolisthesis. Skull base and vertebrae: The basion-dental and atlanto-dental intervals are maintained.No evidence of acute fracture to the cervical spine. Soft tissues and spinal canal: No prevertebral fluid or swelling. No visible canal hematoma. Disc levels: Mild cervical spondylosis. No high-grade bony spinal canal stenosis or significant bony neural foraminal narrowing. Upper chest: Extensive airspace opacities within the partially imaged left lung apex. Less prominent interstitial and patchy airspace opacities within the imaged right lung apex. Round 0.9 cm lucent focus along the medial aspect of the left lung apex likely reflecting a subpleural cyst. Other: Partially imaged ET tube and enteric tube. IMPRESSION: CT head: 1. No evidence of  acute intracranial abnormality. 2. Moderate generalized cerebral atrophy, advanced for age. 3. Chronic medially displaced fracture deformity of the right lamina papyracea. 4. Mild ethmoid and maxillary sinus mucosal thickening. CT cervical spine: 1. No evidence of acute fracture to the cervical spine. 2. Partially imaged airspace disease within the imaged lung apices (extensive on the left). Clinical correlation is recommended. 3. 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 Electronically Signed   By: Jackey Loge DO   On: 11/01/2019 18:57   MR BRAIN WO CONTRAST  Result Date: 11/13/2019 CLINICAL DATA:  Mental status change, alcohol/drug use. Additional history provided: 54 year old male with HIV/aids and alcohol abuse status post PEA arrest. EXAM: MRI HEAD WITHOUT CONTRAST TECHNIQUE: Multiplanar, multiecho pulse sequences of the brain and surrounding structures were obtained without intravenous contrast. COMPARISON:  Brain MRI 11/05/2019. FINDINGS: Brain: Mild intermittent motion degradation. Stable age-advanced cerebral and cerebellar atrophy. Subtle T2/FLAIR hyperintense signal abnormality throughout much of the cerebellum is questioned. Subtle multifocal T2/FLAIR hyperintense signal abnormality is also questioned within the cerebral cortex. These findings are similar as compared to the prior MRI of 11/05/2019. The previously described symmetric basal ganglia signal abnormality has become less conspicuous. Redemonstrated prominent perivascular space within the inferior right basal ganglia. No evidence of intracranial mass. No chronic intracranial blood products. No extra-axial fluid collection. No midline shift. Vascular: Expected proximal  arterial flow voids. Diminutive posterior circulation, possibly developmental. Skull and upper cervical spine: No focal marrow lesion. Sinuses/Orbits: Visualized orbits show no acute finding. Mild  ethmoid and maxillary sinus mucosal thickening. Secretions within the posterior nasopharynx. Partially imaged life support tubes. Trace fluid within right mastoid air cells. IMPRESSION: As before, subtle T2/FLAIR hyperintense signal abnormality is questioned throughout much of the cerebellum and at multiple sites within the bilateral cerebral cortex. The previously described symmetric basal ganglia signal abnormality has become less conspicuous. Given the provided history of recent PEA arrest, these findings are suspicious for evolving sequela of hypoxic/ischemic injury. No intracranial mass effect or herniation. Electronically Signed   By: Jackey Loge DO   On: 11/13/2019 15:40   MR BRAIN W WO CONTRAST  Addendum Date: 11/05/2019   ADDENDUM REPORT: 11/05/2019 17:12 ADDENDUM: Findings discussed with Dr. Francine Graven via telephone. Electronically Signed   By: Feliberto Harts MD   On: 11/05/2019 17:12   Result Date: 11/05/2019 CLINICAL DATA:  Concern for anoxic brain damage. EXAM: MRI HEAD WITHOUT AND WITH CONTRAST TECHNIQUE: Multiplanar, multiecho pulse sequences of the brain and surrounding structures were obtained without and with intravenous contrast. CONTRAST:  62mL GADAVIST GADOBUTROL 1 MMOL/ML IV SOLN COMPARISON:  CT head 11/01/2019 FINDINGS: Brain: Possible subtle FLAIR hyperintensity involving the cerebellum, cortex diffusely, and bilateral basal ganglia. Additionally, there is mild DWI hyperintensity involving the caudate and possibly putamen (for example see series 7, images 47 through 52)a. No acute hemorrhage. No hydrocephalus. No mass lesion or abnormal mass effect. Dilated perivascular space in the inferior right basal ganglia. Vascular: Major proximal arterial flow voids are maintained at the skull base. Skull and upper cervical spine: Normal marrow signal. Sinuses/Orbits: Remote right medial orbital wall fracture. Mild mucosal thickening bilateral maxillary sinuses, sphenoid sinuses, and ethmoid air  cells. Fluid layering in the right sphenoid sinus. Other: Small bilateral mastoid effusions. IMPRESSION: There is mild DWI hyperintensity involving the caudate and possibly putamen bilaterally. Additionally, possible subtle FLAIR hyperintensity involving the cerebellum, cortex diffusely, and bilateral basal ganglia. Although not diagnostic, these findings could be seen with early hypoxic/ischemic brain injury in this patient status post PEA arrest. A follow-up MRI could be considered to evaluate for progressive change, if clinically indicated. Electronically Signed: By: Feliberto Harts MD On: 11/05/2019 17:00   US SCROTUM  Result Date: 11/12/2019 CLINICAL DATA:  Left scrotal swelling.  Patient unresponsive. EXAM: ULTRASOUND OF SCROTUM TECHNIQUE: Complete ultrasound examination of the testicles, epididymis, and other scrotal structures was performed. COMPARISON:  None. FINDINGS: Right testicle Measurements: 3.5 x 1.7 x 2.1 cm. No mass or microlithiasis visualized. Internal blood flow is seen on color Doppler ultrasound. Left testicle Measurements: 3.8 x 1.6 x 2.4 cm. No mass or microlithiasis visualized. Internal blood flow is seen on color Doppler ultrasound. Right epididymis:  Normal in size and appearance. Left epididymis:  Normal in size and appearance. Hydrocele: A large left-sided hydrocele and left inguinal hernia are seen. Varicocele:  None visualized. IMPRESSION: Large left hydrocele and left inguinal hernia. No evidence of testicular mass. Electronically Signed   By: Danae Orleans M.D.   On: 11/12/2019 20:37   DG CHEST PORT 1 VIEW  Result Date: 11/11/2019 CLINICAL DATA:  Bibasilar atelectasis/infiltrate. EXAM: PORTABLE CHEST 1 VIEW COMPARISON:  11/10/2019; 11/09/2019; 11/07/2019 FINDINGS: Grossly unchanged cardiac silhouette and mediastinal contours. Stable position of support apparatus. No pneumothorax. Improved aeration of the lungs with minimal residual perihilar opacities. No new focal  airspace opacities. No pleural effusion or evidence of  edema. No acute osseous abnormalities. IMPRESSION: 1.  Stable positioning of support apparatus.  No pneumothorax. 2. Improved aeration of the lungs with minimal residual perihilar atelectasis/scar. Electronically Signed   By: Simonne Come M.D.   On: 11/11/2019 13:18   DG CHEST PORT 1 VIEW  Result Date: 11/10/2019 CLINICAL DATA:  Respiratory failure. EXAM: PORTABLE CHEST 1 VIEW COMPARISON:  11/09/2019. FINDINGS: Interim placement of right IJ line, its tip is at the cavoatrial junction. Endotracheal tube, NG tube in stable position. Heart size normal. Low lung volumes with bibasilar atelectasis and infiltrates. Similar findings noted on prior exam. No pleural effusion or pneumothorax. IMPRESSION: 1. Interim placement of right IJ line, its tip is at the cavoatrial junction. Endotracheal tube and NG tube in stable position. 2. Low lung volumes with bibasilar atelectasis and infiltrates, unchanged from prior exam. Electronically Signed   By: Maisie Fus  Register   On: 11/10/2019 06:55   DG CHEST PORT 1 VIEW  Result Date: 11/09/2019 CLINICAL DATA:  Acute respiratory failure. EXAM: PORTABLE CHEST 1 VIEW COMPARISON:  11/07/2019. FINDINGS: Endotracheal tube, NG tube in stable position. Heart size stable. Persistent bibasilar atelectasis/infiltrates. No pleural effusion or pneumothorax. IMPRESSION: 1. Lines and tubes in stable position. 2. Persistent bibasilar atelectasis/infiltrates. Electronically Signed   By: Maisie Fus  Register   On: 11/09/2019 05:25   DG CHEST PORT 1 VIEW  Result Date: 11/07/2019 CLINICAL DATA:  Hypoxia.  Fever. EXAM: PORTABLE CHEST 1 VIEW COMPARISON:  November 02, 2019 FINDINGS: Endotracheal tube tip is 5.0 cm above the carina. Nasogastric tube tip and side port below diaphragm. No pneumothorax. There is ill-defined airspace opacity in the lower lung regions bilaterally. Heart size and pulmonary vascularity are normal. No adenopathy. No bone  lesions. IMPRESSION: Tube positions as described without pneumothorax. Patchy airspace opacity in the lower lung regions consistent with multifocal pneumonia. Question atypical organism etiology given overall appearance. Check of COVID-19 status may be advisable. Heart size normal. No adenopathy evident. Electronically Signed   By: Bretta Bang III M.D.   On: 11/07/2019 11:21   DG Chest Port 1 View  Result Date: 11/02/2019 CLINICAL DATA:  Respiratory failure. EXAM: PORTABLE CHEST 1 VIEW COMPARISON:  11/01/2019. FINDINGS: Endotracheal tube and NG tube in stable position. Heart size stable. Persistent left upper lobe infiltrate without interim change. Low lung volumes. Mild infiltrate right base cannot be excluded. No pleural effusion or pneumothorax. IMPRESSION: 1. Lines and tubes in stable position. 2. Persistent left upper lobe infiltrate without interim change. Low lung volumes. Mild infiltrate in the right lung base cannot be excluded on today's exam. Electronically Signed   By: Maisie Fus  Register   On: 11/02/2019 05:34   DG Chest Portable 1 View  Result Date: 11/01/2019 CLINICAL DATA:  Endotracheal tube repositioning. EXAM: PORTABLE CHEST 1 VIEW COMPARISON:  November 01, 2019 (5:37 p.m.) FINDINGS: An endotracheal tube is seen with its distal tip approximately 4.2 cm from the carina. Stable nasogastric tube positioning is noted. Stable moderate to marked severity left upper lobe infiltrate is seen. There is no evidence of a pleural effusion or pneumothorax. The heart size and mediastinal contours are within normal limits. The visualized skeletal structures are unremarkable. IMPRESSION: 1. Endotracheal tube repositioning since the prior study. 2. Persistent moderate to marked severity left upper lobe infiltrate. Electronically Signed   By: Aram Candela M.D.   On: 11/01/2019 18:19   DG Chest Portable 1 View  Result Date: 11/01/2019 CLINICAL DATA:  Status post cardiac arrest. EXAM: PORTABLE  CHEST 1  VIEW COMPARISON:  April 21, 2011 FINDINGS: An endotracheal tube is seen with its distal tip noted at the origin of the right mainstem bronchus. A nasogastric tube is seen with its distal end extending into the body of the stomach. Moderate severity infiltrate is seen within the left upper lobe. There is no evidence of a pleural effusion or pneumothorax. The heart size and mediastinal contours are within normal limits. The visualized skeletal structures are unremarkable. IMPRESSION: 1. Endotracheal and nasogastric tube positioning, as described above. Withdrawal of the ET tube by approximately 2.5 cm is recommended. 2. Left upper lobe infiltrate. Electronically Signed   By: Aram Candela M.D.   On: 11/01/2019 18:18   DG Abd Portable 1V-Small Bowel Obstruction Protocol-initial, 8 hr delay  Result Date: 11/15/2019 CLINICAL DATA:  Small-bowel obstruction EXAM: PORTABLE ABDOMEN - 1 VIEW COMPARISON:  11/14/2019 FINDINGS: Nasogastric tube is seen within the left upper quadrant of the abdomen. Administered contrast now opacifies the entire colon and rectal vault. Several mildly dilated gas-filled loops of small bowel are seen within the mid abdomen. Together the findings suggest changes of a partial small bowel obstruction or resolving bowel obstruction. No gross free intraperitoneal gas. IMPRESSION: Contrast now opacifies the colon. Findings are compatible with a probable partial small bowel obstruction. Electronically Signed   By: Helyn Numbers MD   On: 11/15/2019 03:16   DG Abd Portable 1V-Small Bowel Protocol-Position Verification  Result Date: 11/14/2019 CLINICAL DATA:  Check gastric catheter placement EXAM: PORTABLE ABDOMEN - 1 VIEW COMPARISON:  None. FINDINGS: Gastric catheter is noted within the stomach. Scattered large and small bowel gas is seen. No free air is noted. IMPRESSION: Gastric catheter within the stomach. Electronically Signed   By: Alcide Clever M.D.   On: 11/14/2019 18:04   DG  Abd Portable 1V  Result Date: 11/09/2019 CLINICAL DATA:  Vomiting, cardiac arrest EXAM: PORTABLE ABDOMEN - 1 VIEW COMPARISON:  None. FINDINGS: Nasogastric tube is seen with its tip overlying the expected mid body of the stomach. The abdominal gas pattern is nonspecific due to a paucity of intra-abdominal gas. IMPRESSION: Nasogastric tube in expected position. Electronically Signed   By: Helyn Numbers MD   On: 11/09/2019 17:30   EEG adult  Result Date: 11/10/2019 Charlsie Quest, MD     11/10/2019  3:37 PM Patient Name:Ryan Orozco UXN:[id number] Epilepsy Attending:Priyanka Annabelle Harman Referring Physician/Provider:Brandi Veleta Miners, NP Date: 11/10/2019 Duration:32.28 minutes  Patient history:54 year old male status post cardiac arrest. EEG to evaluate for seizures.  Level of alertness:Awake, sleep/sedated  AEDs during EEG study: LEV, propofol  Technical aspects: This EEG study was done with scalp electrodes positioned according to the 10-20 International system of electrode placement. Electrical activity was acquired at a sampling rate of 500Hz  and reviewed with a high frequency filter of 70Hz  and a low frequency filter of 1Hz . EEG data were recorded continuously and digitally stored.  Description:During awake state, no clear posterior dominant rhythm was seen.  EEGshowed continuous generalizedlow amplitude6 to 9 Hz theta-alpha activity as well as intermittent generalized 2 to 3 Hz delta slowing.  Sharp transients were noted in central parietal region. Patient was noted to have upper left eye twitching as well as "hand quiver" in response to squeezing per EEG technician.  Concomitant EEG before, during and after the event did not show any EEG change to suggest seizure.  Hyperventilation and photic stimulation were not performed.   ABNORMALITY -Continuousslow, generalized  IMPRESSION: This studyissuggestive of severe diffuse encephalopathy, nonspecific etiology but likely  related to  anoxic/hypoxic brain injury, sedation.Patient was noted to have upper left eye twitching as well as "hand quiver" in respond to squeezing per EEG technician without concomitant EEG change and was most likely not epileptic.  Of note, focal motor seizures may not be seen on scalp EEG.  These events could also be stimulation induced myoclonus.  Therefore clinical correlation is recommended. Charlsie Quest  EEG adult  Result Date: 11/02/2019 Charlsie Quest, MD     11/02/2019  9:12 AM Patient Name: Ryan Orozco MRN: [id number] Epilepsy Attending: Charlsie Quest Referring Physician/Provider: Pricilla Holm, NP Date: 11/01/2019 Duration: 21.76mins Patient history: 53 year old male status post cardiac arrest.  EEG to evaluate for seizures. Level of alertness: Comatose AEDs during EEG study: None Technical aspects: This EEG study was done with scalp electrodes positioned according to the 10-20 International system of electrode placement. Electrical activity was acquired at a sampling rate of 500Hz  and reviewed with a high frequency filter of 70Hz  and a low frequency filter of 1Hz . EEG data were recorded continuously and digitally stored. Description: Patient was noted to have frequent episodes of axial jerking with eye opening.  Concomitant EEG showed generalized polyspikes consistent with myoclonic seizures.  EEG also showed continuous generalized background suppression.  EEG was not reactive to tactile stimulation.  Hyperventilation and photic stimulation were not performed.   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 Priyanka Annabelle Harman   Overnight EEG with video  Result Date: 11/02/2019 Charlsie Quest, MD     11/03/2019  8:47 AM Patient Name: Ryan Orozco MRN: [id number] Epilepsy Attending:  Charlsie Quest Referring Physician/Provider: Pricilla Holm, NP Duration: 11/01/2019 2225 to 11/02/2019 2225  Patient history: 54 year old male status post cardiac arrest.  EEG to evaluate for seizures.  Level of alertness: Comatose  AEDs during EEG study: Versed, LEV  Technical aspects: This EEG study was done with scalp electrodes positioned according to the 10-20 International system of electrode placement. Electrical activity was acquired at a sampling rate of 500Hz  and reviewed with a high frequency filter of 70Hz  and a low frequency filter of 1Hz . EEG data were recorded continuously and digitally stored.  Description: Patient was initially noted to have frequent episodes of axial jerking with eye opening.  Concomitant EEG showed generalized polyspikes consistent with myoclonic seizures.  EEG also showed continuous generalized background suppression.  Gradually as Versed was titrated up, frequency of myoclonic jerks started reducing and no more myoclonic jerks were seen after around 1500 on 11/02/2019. EEG done showed continuous generalized  low amplitude 3 to 6 Hz theta-delta slowing which was reactive to noxious stimulation.  Hyperventilation and photic stimulation were not performed.    ABNORMALITY -Myoclonic seizures, generalized -Background suppression, generalized -Continue slow, generalized  IMPRESSION: This study showed initially showed evidence of frequent myoclonic seizures.  As Versed was titrated up, after around 1500 on 11/02/2019, seizures improved and EEG was suggestive of severe diffuse encephalopathy, nonspecific etiology but likely related to anoxic/hypoxic brain injury, sedation.  Charlsie Quest   ECHOCARDIOGRAM COMPLETE  Result Date: 11/02/2019    ECHOCARDIOGRAM REPORT   Patient Name:   Ryan Orozco Date of Exam: 11/02/2019 Medical Rec #:  [id number]         Height:       66.0 in Accession #:    [id number]        Weight:  173.9 lb Date of Birth:  [birth year]         BSA:          1.885 m Patient Age:    53 years          BP:           166/87 mmHg Patient Gender: M                 HR:           90 bpm. Exam Location:  Inpatient Procedure: 2D Echo, Cardiac Doppler and Color Doppler Indications:    Cardiac Arrest I46.9  History:        Patient has no prior history of Echocardiogram examinations.                 Alcohol abuse. HIV.  Sonographer:    Elmarie Shileyiffany Dance Referring Phys: [id number] Bernadene PersonKATHRYN A WHITEHEART  Sonographer Comments: Echo performed with patient supine and on artificial respirator. IMPRESSIONS  1. Left ventricular ejection fraction, by estimation, is 60 to 65%. The left ventricle has normal function. The left ventricle has no regional wall motion abnormalities. Left ventricular diastolic parameters are consistent with Grade I diastolic dysfunction (impaired relaxation).  2. Right ventricular systolic function is normal. The right ventricular size is normal.  3. The mitral valve is normal in structure. Trivial mitral valve regurgitation. No evidence of mitral stenosis.  4. The aortic valve is tricuspid. Aortic valve regurgitation is not visualized. No aortic stenosis is present.  5. The inferior vena cava is normal in size with greater than 50% respiratory variability, suggesting right atrial pressure of 3 mmHg. Comparison(s): No prior Echocardiogram. FINDINGS  Left Ventricle: Left ventricular ejection fraction, by estimation, is 60 to 65%. The left ventricle has normal function. The left ventricle has no regional wall motion abnormalities. The left ventricular internal cavity size was normal in size. There is  no left ventricular hypertrophy. Left ventricular diastolic parameters are consistent with Grade I diastolic dysfunction (impaired relaxation). Right Ventricle: The right ventricular size is normal. No increase in right ventricular wall thickness. Right ventricular systolic function is normal. Left Atrium: Left atrial size was normal in size. Right Atrium: Right  atrial size was normal in size. Pericardium: There is no evidence of pericardial effusion. Mitral Valve: The mitral valve is normal in structure. Trivial mitral valve regurgitation. No evidence of mitral valve stenosis. Tricuspid Valve: The tricuspid valve is normal in structure. Tricuspid valve regurgitation is trivial. Aortic Valve: The aortic valve is tricuspid. Aortic valve regurgitation is not visualized. No aortic stenosis is present. Pulmonic Valve: The pulmonic valve was normal in structure. Pulmonic valve regurgitation is not visualized. Aorta: The aortic root and ascending aorta are structurally normal, with no evidence of dilitation. Venous: The inferior vena cava is normal in size with greater than 50% respiratory variability, suggesting right atrial pressure of 3 mmHg. IAS/Shunts: No atrial level shunt detected by color flow Doppler.  LEFT VENTRICLE PLAX 2D LVIDd:         3.93 cm  Diastology LVIDs:         2.49 cm  LV e' medial:    7.83 cm/s LV PW:         1.00 cm  LV E/e' medial:  7.3 LV IVS:        0.85 cm  LV e' lateral:   8.16 cm/s LVOT diam:     2.00 cm  LV E/e' lateral: 7.0 LV SV:  81 LV SV Index:   43 LVOT Area:     3.14 cm  RIGHT VENTRICLE             IVC RV Basal diam:  3.02 cm     IVC diam: 1.59 cm RV Mid diam:    1.88 cm RV S prime:     20.50 cm/s TAPSE (M-mode): 2.3 cm LEFT ATRIUM             Index       RIGHT ATRIUM           Index LA diam:        3.60 cm 1.91 cm/m  RA Area:     21.20 cm LA Vol (A2C):   60.2 ml 31.94 ml/m RA Volume:   72.30 ml  38.36 ml/m LA Vol (A4C):   31.4 ml 16.66 ml/m LA Biplane Vol: 44.1 ml 23.40 ml/m  AORTIC VALVE LVOT Vmax:   129.00 cm/s LVOT Vmean:  91.700 cm/s LVOT VTI:    0.257 m  AORTA Ao Root diam: 3.00 cm Ao Asc diam:  2.70 cm MITRAL VALVE MV Area (PHT): 2.50 cm    SHUNTS MV Decel Time: 303 msec    Systemic VTI:  0.26 m MV E velocity: 56.80 cm/s  Systemic Diam: 2.00 cm MV A velocity: 69.40 cm/s MV E/A ratio:  0.82 Laurance Flatten MD  Electronically signed by Laurance Flatten MD Signature Date/Time: 11/02/2019/12:29:40 PM    Final     Labs:  CBC: Recent Labs    11/10/19 0438 11/11/19 0555 11/14/19 0920 11/15/19 0116  WBC 7.0 8.8 3.8* 3.8*  HGB 12.5* 13.5 11.8* 11.5*  HCT 39.0 43.4 37.9* 36.5*  PLT 252 256 248 219    COAGS: Recent Labs    11/01/19 1915 11/02/19 0036 11/02/19 0756  INR 1.1 1.0 1.0  APTT  --  30 29    BMP: Recent Labs    11/04/19 0403 11/04/19 0403 11/05/19 0330 11/05/19 0330 11/06/19 0350 11/06/19 0350 11/07/19 0320 11/08/19 0302 11/10/19 0438 11/11/19 0555 11/14/19 0920 11/15/19 0116  NA 135   < > 136   < > 136   < > 139   < > 141 143 144 145  K 3.9   < > 3.6   < > 3.5   < > 3.8   < > 3.5 3.5 3.3* 3.5  CL 104   < > 104   < > 106   < > 106   < > 105 103 114* 116*  CO2 23   < > 22   < > 26   < > 23   < > 24 26 19* 21*  GLUCOSE 116*   < > 105*   < > 130*   < > 125*   < > 115* 100* 107* 100*  BUN 7   < > 8   < > 12   < > 11   < > 19 25* 19 15  CALCIUM 8.3*   < > 8.3*   < > 8.3*   < > 8.8*   < > 10.0 10.6* 9.2 9.4  CREATININE 0.57*   < > 0.56*   < > 0.65   < > 0.63   < > 0.59* 0.83 0.78 0.68  GFRNONAA >60   < > >60   < > >60   < > >60   < > >60 >60 >60 >60  GFRAA >60  --  >60  --  >  60  --  >60  --   --   --   --   --    < > = values in this interval not displayed.    LIVER FUNCTION TESTS: Recent Labs    11/10/19 0438 11/11/19 0555 11/14/19 0920 11/15/19 0116  BILITOT 0.8 0.7 0.3 0.5  AST 32 43* 46* 45*  ALT ALKPHOS 77 95 115 125  PROT 6.9 7.9 6.7 6.7  ALBUMIN 2.7* 3.0* 2.8* 2.8*     Assessment and Plan:  Dysphasia secondary to cardiac arrest. Plan for image-guided percutaneous gastrostomy tube placement in IR tentatively for tomorrow 11/16/2019 pending IR scheduling. Patient will be NPO at midnight- please hold all tube feeds at this time as well. Afebrile. Will hold Heparin SQ per IR protocol. INR 1.0 11/02/2019.  Risks and benefits discussed  with the patient including, but not limited to the need for a barium enema during the procedure, bleeding, infection, peritonitis, or damage to adjacent structures. All of the patient's questions were answered, patient is agreeable to proceed. Consent obtained by patient's daughter, Ryan Orozco, bedside- signed and in IR control room.   Thank you for this interesting consult.  I greatly enjoyed meeting Ryan Orozco and look forward to participating in their care.  A copy of this report was sent to the requesting provider on this date.  Electronically Signed: Elwin Mocha, PA-C 11/15/2019, 1:48 PM   I spent a total of 20 Minutes in face to face in clinical consultation, greater than 50% of which was counseling/coordinating care for dysphasia/percutaneous gastrostomy tube placement.

## 2019-11-15 NOTE — Consult Note (Signed)
WOC Nurse Consult Note: Reason for Consult:partial thickness tissue loss to rectum.  Flexiseal in place and has been leaking intermittently.   Wound type:Incontinence associated skin damage from stool exposure with leaking flexiseal.   Pressure Injury POA: NA Measurement: 0.5 cm round denuded lesion near rectal opening.  Wound PJK:DTOI an dmoist Drainage (amount, consistency, odor) scant weeping Periwound: intact Dressing procedure/placement/frequency: barrier cream ointment three times daily and PRn soilage/leaking rectal tube Will not follow at this time.  Please re-consult if needed.  Maple Hudson MSN, RN, FNP-BC [facility] Wound, Ostomy, Continence Nurse Pager [phone]

## 2019-11-15 NOTE — Progress Notes (Signed)
[facility] Progress Note     Subjective: Patient unresponsive on the vent. TF were held overnight due to abdominal film being read as PSBO. However patient has not been vomiting and is clinically having bowel function. Discussed with critical care resident at bedside.   Objective: Vital signs in last 24 hours: Temp:  [98.1 F (36.7 C)-99.3 F (37.4 C)] 98.2 F (36.8 C) (10/13 0744) Pulse Rate:  [45-99] 60 (10/13 0700) Resp:  [17-27] 21 (10/13 0700) BP: (109-170)/(66-109) 149/79 (10/13 0700) SpO2:  [95 %-100 %] 100 % (10/13 0759) FiO2 (%):  [30 %] 30 % (10/13 0759) Weight:  [77 kg] 77 kg (10/13 0400) Last BM Date: 11/13/19  Intake/Output from previous day: 10/12 0701 - 10/13 0700 In: 4177.6 [I.V.:2312.5; NG/GT:500; IV Piggyback:1365.1] Out: 592 [Urine:531; Stool:61] Intake/Output this shift: No intake/output data recorded.  PE: General: WD male, unresponsive on the vent  Heart: regular, rate, and rhythm.  Normal s1,s2. No obvious murmurs, gallops, or rubs noted.  Palpable radial and pedal pulses bilaterally Lungs: CTAB, no wheezes, rhonchi, or rales noted.  Respiratory effort nonlabored Abd: soft, NT, ND, +BS, bilateral inguinal hernias appear chronically incarcerated, rectal tube in place with liquid brown stool     Lab Results:  Recent Labs    11/14/19 0920 11/15/19 0116  WBC 3.8* 3.8*  HGB 11.8* 11.5*  HCT 37.9* 36.5*  PLT 248 219   BMET Recent Labs    11/14/19 0920 11/15/19 0116  NA 144 145  K 3.3* 3.5  CL 114* 116*  CO2 19* 21*  GLUCOSE 107* 100*  BUN 19 15  CREATININE 0.78 0.68  CALCIUM 9.2 9.4   PT/INR No results for input(s): LABPROT, INR in the last 72 hours. CMP     Component Value Date/Time   NA 145 11/15/2019 0116   K 3.5 11/15/2019 0116   CL 116 (H) 11/15/2019 0116   CO2 21 (L) 11/15/2019 0116   GLUCOSE 100 (H) 11/15/2019 0116   BUN 15 11/15/2019 0116   CREATININE 0.68 11/15/2019 0116   CREATININE 0.72 10/12/2019 1100    CALCIUM 9.4 11/15/2019 0116   PROT 6.7 11/15/2019 0116   ALBUMIN 2.8 (L) 11/15/2019 0116   AST 45 (H) 11/15/2019 0116   ALT 26 11/15/2019 0116   ALKPHOS 125 11/15/2019 0116   BILITOT 0.5 11/15/2019 0116   GFRNONAA >60 11/15/2019 0116   GFRNONAA 106 10/12/2019 1100   GFRAA >60 11/07/2019 0320   GFRAA 123 10/12/2019 1100   Lipase     Component Value Date/Time   LIPASE 48 11/01/2019 1915       Studies/Results: CT ABDOMEN PELVIS WO CONTRAST  Result Date: 11/14/2019 CLINICAL DATA:  54 year old with abdominal distension. Evaluate anatomy prior to percutaneous gastrostomy tube placement. EXAM: CT ABDOMEN AND PELVIS WITHOUT CONTRAST TECHNIQUE: Multidetector CT imaging of the abdomen and pelvis was performed following the standard protocol without IV contrast. COMPARISON:  CT 04/27/2012 FINDINGS: Lower chest: Patchy atelectasis at both lung bases, right side greater than left. No significant pleural fluid. Hepatobiliary: Normal appearance of the liver and gallbladder. Pancreas: Unremarkable. No pancreatic ductal dilatation or surrounding inflammatory changes. Spleen: Normal in size without focal abnormality. Adrenals/Urinary Tract: Normal appearance of the adrenal glands. Negative for kidney stones or hydronephrosis. Wall thickening associated with the anterior urinary bladder and a portion of the anterior urinary bladder may be extending into the right inguinal hernia. No significant bladder distension. Stomach/Bowel: Nasogastric tube terminates in the stomach body. Stomach is not distended. Normal  appearance of the duodenum. Fluid-filled dilated loops of small bowel in the abdomen. Small bowel loops measure up to 3.6 cm in the left anterior abdomen on sequence 3, image 52. Distal small bowel is decompressed. Patient has a right inguinal hernia containing fat and difficult to exclude bowel in this area. There is edema and inflammatory changes associated with the right inguinal hernia and wall  thickening of the anterior urinary bladder in this area. Findings raise concern for an incarcerated right inguinal hernia and at least partial small bowel obstruction. Left inguinal hernia contains a portion of the sigmoid colon along with fluid. No significant distension of the colon proximal to the left inguinal hernia. There is a rectal tube with an inflated balloon. Normal gas-filled appendix. Stomach is cephalad to the transverse colon. Vascular/Lymphatic: Normal caliber of the abdominal aorta. Small lymph nodes in the retroperitoneum. Small inguinal lymph nodes. Reproductive: Prostate is unremarkable. Other: Bilateral inguinal hernias, left side greater than right. Large amount of colon within the left inguinal hernia containing fluid as well. Concern for inflammatory changes associated with the right inguinal hernia. Negative for free air. Small amount of presacral edema. No significant free fluid within the abdomen or pelvis (except for the left inguinal hernia). Musculoskeletal: No acute bone abnormality. IMPRESSION: 1. Right inguinal hernia with inflammatory changes. There is wall thickening of the anterior urinary bladder likely associated with the right inguinal hernia and mild distention of small bowel loops containing fluid. Findings are concerning for an incarcerated right inguinal hernia with at least a partial small bowel obstruction. Recommend surgical consultation. 2. Large left inguinal hernia containing sigmoid colon and fluid. There is no evidence for a colonic obstruction. 3. Transverse colon is caudal to the stomach and the anatomy is amendable for percutaneous gastrostomy tube placement. These results were called by telephone at the time of interpretation on 11/14/2019 at 2:57 pm to provider [facility] , who verbally acknowledged these results. Electronically Signed   By: Richarda Overlie M.D.   On: 11/14/2019 15:08   MR BRAIN WO CONTRAST  Result Date: 11/13/2019 CLINICAL DATA:  Mental  status change, alcohol/drug use. Additional history provided: 54 year old male with HIV/aids and alcohol abuse status post PEA arrest. EXAM: MRI HEAD WITHOUT CONTRAST TECHNIQUE: Multiplanar, multiecho pulse sequences of the brain and surrounding structures were obtained without intravenous contrast. COMPARISON:  Brain MRI 11/05/2019. FINDINGS: Brain: Mild intermittent motion degradation. Stable age-advanced cerebral and cerebellar atrophy. Subtle T2/FLAIR hyperintense signal abnormality throughout much of the cerebellum is questioned. Subtle multifocal T2/FLAIR hyperintense signal abnormality is also questioned within the cerebral cortex. These findings are similar as compared to the prior MRI of 11/05/2019. The previously described symmetric basal ganglia signal abnormality has become less conspicuous. Redemonstrated prominent perivascular space within the inferior right basal ganglia. No evidence of intracranial mass. No chronic intracranial blood products. No extra-axial fluid collection. No midline shift. Vascular: Expected proximal arterial flow voids. Diminutive posterior circulation, possibly developmental. Skull and upper cervical spine: No focal marrow lesion. Sinuses/Orbits: Visualized orbits show no acute finding. Mild ethmoid and maxillary sinus mucosal thickening. Secretions within the posterior nasopharynx. Partially imaged life support tubes. Trace fluid within right mastoid air cells. IMPRESSION: As before, subtle T2/FLAIR hyperintense signal abnormality is questioned throughout much of the cerebellum and at multiple sites within the bilateral cerebral cortex. The previously described symmetric basal ganglia signal abnormality has become less conspicuous. Given the provided history of recent PEA arrest, these findings are suspicious for evolving sequela of hypoxic/ischemic injury. No  intracranial mass effect or herniation. Electronically Signed   By: Jackey Loge DO   On: 11/13/2019 15:40   DG Abd  Portable 1V-Small Bowel Obstruction Protocol-initial, 8 hr delay  Result Date: 11/15/2019 CLINICAL DATA:  Small-bowel obstruction EXAM: PORTABLE ABDOMEN - 1 VIEW COMPARISON:  11/14/2019 FINDINGS: Nasogastric tube is seen within the left upper quadrant of the abdomen. Administered contrast now opacifies the entire colon and rectal vault. Several mildly dilated gas-filled loops of small bowel are seen within the mid abdomen. Together the findings suggest changes of a partial small bowel obstruction or resolving bowel obstruction. No gross free intraperitoneal gas. IMPRESSION: Contrast now opacifies the colon. Findings are compatible with a probable partial small bowel obstruction. Electronically Signed   By: Helyn Numbers MD   On: 11/15/2019 03:16   DG Abd Portable 1V-Small Bowel Protocol-Position Verification  Result Date: 11/14/2019 CLINICAL DATA:  Check gastric catheter placement EXAM: PORTABLE ABDOMEN - 1 VIEW COMPARISON:  None. FINDINGS: Gastric catheter is noted within the stomach. Scattered large and small bowel gas is seen. No free air is noted. IMPRESSION: Gastric catheter within the stomach. Electronically Signed   By: Alcide Clever M.D.   On: 11/14/2019 18:04    Anti-infectives: Anti-infectives (From admission, onward)   Start     Dose/Rate Route Frequency Ordered Stop   11/15/19 0900  sulfamethoxazole-trimethoprim (BACTRIM DS) 800-160 MG per tablet 1 tablet        1 tablet Per Tube Once per day on Mon Wed Fri 11/13/19 1906     11/13/19 1615  dolutegravir (TIVICAY) tablet 50 mg        50 mg Oral Daily 11/13/19 1521     11/13/19 1615  emtricitabine-tenofovir AF (DESCOVY) 200-25 MG per tablet 1 tablet        1 tablet Per Tube Daily 11/13/19 1521     11/13/19 1530  sulfamethoxazole-trimethoprim (BACTRIM DS) 800-160 MG per tablet 1 tablet  Status:  Discontinued        1 tablet Oral Once per day on Mon Wed Fri 11/13/19 1441 11/13/19 1906   11/13/19 1500  bictegravir-emtricitabine-tenofovir  AF (BIKTARVY) 50-200-25 MG per tablet 1 tablet  Status:  Discontinued        1 tablet Oral Daily 11/13/19 1403 11/13/19 1521   11/10/19 1000  erythromycin 250 mg in sodium chloride 0.9 % 100 mL IVPB        250 mg 100 mL/hr over 60 Minutes Intravenous Every 8 hours 11/10/19 0907 11/11/19 2000   11/07/19 1200  ceFEPIme (MAXIPIME) 2 g in sodium chloride 0.9 % 100 mL IVPB        2 g 200 mL/hr over 30 Minutes Intravenous Every 8 hours 11/07/19 1013 11/13/19 2127   11/03/19 1200  cefTRIAXone (ROCEPHIN) 2 g in sodium chloride 0.9 % 100 mL IVPB        2 g 200 mL/hr over 30 Minutes Intravenous Every 24 hours 11/03/19 0922 11/05/19 1427   11/02/19 0800  vancomycin (VANCOREADY) IVPB 750 mg/150 mL  Status:  Discontinued        750 mg 150 mL/hr over 60 Minutes Intravenous Every 12 hours 11/01/19 1935 11/02/19 1105   11/02/19 0600  piperacillin-tazobactam (ZOSYN) IVPB 3.375 g  Status:  Discontinued        3.375 g 12.5 mL/hr over 240 Minutes Intravenous Every 8 hours 11/02/19 0311 11/03/19 0920   11/01/19 2200  ceFEPIme (MAXIPIME) 2 g in sodium chloride 0.9 % 100 mL IVPB  Status:  Discontinued        2 g 200 mL/hr over 30 Minutes Intravenous Every 12 hours 11/01/19 2143 11/02/19 0311   11/01/19 1915  vancomycin (VANCOREADY) IVPB 1500 mg/300 mL        1,500 mg 150 mL/hr over 120 Minutes Intravenous  Once 11/01/19 1909 11/01/19 2147   11/01/19 1845  cefTRIAXone (ROCEPHIN) 1 g in sodium chloride 0.9 % 100 mL IVPB        1 g 200 mL/hr over 30 Minutes Intravenous  Once 11/01/19 1842 11/01/19 2051   11/01/19 1845  azithromycin (ZITHROMAX) 500 mg in sodium chloride 0.9 % 250 mL IVPB        500 mg 250 mL/hr over 60 Minutes Intravenous  Once 11/01/19 1842 11/01/19 2051       Assessment/Plan HIV PEA arrest Acute hypoxic respiratory failure Anoxic encephalopathy Alcohol abuse  Bilateral inguinal hernias with question of RIH with obstruction - contrast passes through to colon on 8h delay film and  patient having bowel function - abdomen remains soft - patient is clinically not obstructed from chronically incarcerated bilateral inguinal hernias - patient is not a candidate for surgical repair of inguinal hernias at this time, and unfortunately I'm not sure he is a surgical candidate even if he were to become obstructed - recommend proceeding with IR placed gastrostomy  - general surgery will sign off at this time, please call if we can be of further assistance   FEN: TF ID: rocephin/Vancomycin 9/29; Maxipime 9/29-10/11 DVT: SQ heparin  LOS: 14 days    Juliet RudeKelly R Lannie Yusuf , [facility] 11/15/2019, 8:36 AM Please see Amion for pager number during day hours 7:00am-4:30pm

## 2019-11-15 NOTE — Progress Notes (Signed)
K+ 3.5  Replaced per protocol  

## 2019-11-15 NOTE — Progress Notes (Signed)
NAMEPrentice Orozco, MRN:  [id number], DOB:  [birth year], LOS: 14 ADMISSION DATE:  11/01/2019, CONSULTATION DATE:  9/29 REFERRING MD:  EDP, CHIEF COMPLAINT:  Cardiac arrest    Brief History   54yo male with hx HIV (last CD4 count 191 on 10/12/19), ETOH with recent ER visit 9/27 after being picked up by police intoxicated. Returned 9/29 after being found down outside convenience store.  Pulseless on EMS arrival with PEA.  Unknown downtime prior to EMS arrival. EMS performed 2 rounds of CPR with 2 epi before ROSC.  Admitted to ICU.   In ER lactate 4.4 post CPR, troponin 450, electrolytes wnl, ETOH 177  Past Medical History  ETOH Abuse HIV - dx 1995  Significant Hospital Events   9/29 Admit post PEA arrest.  UDS positive for THC, benzo's.  ETOH 177 10/05 EEG ongoing. Versed restarted overnight due to agitation. Nicardipine increased.  10/06 Developed tachypnea with WUA, no follow commands off sedation  10/07 Vomiting, TF held / restarted with recurrent vomiting  Consults:  Neuro  Procedures:  ETT 9/29 >>  Significant Diagnostic Tests:  CT head 9/29 >  No evidence of acute intracranial abnormality. Moderate generalized cerebral atrophy, advanced for age. Chronic medially displaced fracture deformity of the right lamina papyracea. Mild ethmoid and maxillary sinus mucosal thickening.  CT cervical spine 9/29 > No evidence of acute fracture to the cervical spine. Partially imaged airspace disease within the imaged lung apices (extensive on the left). Clinical correlation is recommended. 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  EEG 9/29 > Patient was noted to have frequent episodes of axial jerking with eye opening. Concomitant EEG showed generalized polyspikes consistent with myoclonic seizures. EEG also showed continuous generalized background suppression. EEG was not reactive to tactile stimulation.  Hyperventilation and photic stimulation were not performed  ECHO 9/30 > No evidence of wall motion abnormality  MRI 10/3 > Mild DWI hyperintensity involving the caudate and possibly putamen bilaterally. Additionally, possible subtle FLAIR hyperintensity involving the cerebellum, cortex diffusely, and bilateral basal ganglia. Although not diagnostic, these findings could be seen with early hypoxic/ischemic brain injury in this patient status post PEA arrest.   EEG 10/08> severe diffuse encephalopathy likely related to anoxic/hypoxic brain injury  Testicular US 10/10> showed large left hydrocele and left inguinal hernia. No evidence of testicular mass.  MRI brain: evolving hypoxic, anoxic brain injury compared to previous MRI   CT abdomen pelvis 10/12: Right inguinal hernia and partial small bowel obstruction, large left inguinal hernia  Abdominal x-ray: Small bowel obstruction  Micro Data:  BCx2 9/29 >> negative  Urine 9/29 >> UA few bacteria, WBC, RBC, CaOxalate, and Mucus Covid, Flu A/B 9/29 >> negative Tracheal aspirate 10/5 >> normal flora   Antimicrobials:  Vancomycin 9/29 >> 9/30 Cefepime 9/29 >> 9/30 Cefepime 10/5 >> 10/11  Interim history/subjective:  No acute events overnight. Hypotensive, SBP 96-113. UOP 420cc overnight.   Objective   Blood pressure (!) 149/79, pulse 60, temperature 99.3 F (37.4 C), temperature source Oral, resp. rate (!) 21, height 5\' 6"  (1.676 m), weight 77 kg, SpO2 100 %.    Vent Mode: PRVC FiO2 (%):  [30 %] 30 % Set Rate:  [20 bmp] 20 bmp Vt Set:  [510 mL] 510 mL PEEP:  [5 cmH20] 5 cmH20 Plateau Pressure:  [13 cmH20-15 cmH20] 15 cmH20   Intake/Output Summary (Last 24 hours) at 11/15/2019 11/17/2019 Last data filed at 11/15/2019  0700 Gross per 24 hour  Intake 4177.57 ml  Output 592 ml  Net 3585.57 ml   Filed Weights   11/12/19 0421 11/13/19 0500 11/15/19 0400  Weight: 76.8 kg 74.9 kg 77 kg    Examination:  General: Critically  ill-appearing 54 year old male, intubated, no acute distress HEENT: NCAT, no scleral icterus CV: S1 and S2 present RRR PULM: CTAB, normal work of breathing GI: Abdomen soft nontender, bowel sounds present Extremities: No peripheral edema, cyanosis or clubbing Skin: Warm and dry Neuro:  PERRLA 66mm bilaterally, corneal reflux present, withdraws to pain in LE, not in UE  CXR 10/9 >> ETT in good position, NGT in good position, mild bibasilar airspace disease  Resolved Hospital Problem list    Hypokalemia   Assessment & Plan:   Myoclonic seizures, 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 S/p Vancomycin (9/29-9/30). Cefipime (9/29-9/30, 10/5-10/11). Negative tracheal aspirates.  Patient remains on mechanical ventilation and is unable to tolerate pressure support trials. He remains off sedation. -PRVC 8cc/kg  -VAP protocol -Daily SBT / WUA  Cardiac Arrest Hypertension PEA on EMS arrival. No bystander CPR, unknown downtime. 2 rounds CPR/epi with EMS prior to ROSC. EKG normal sinus rhythm with peaked T waves, K 4.2, Echo normal without wall abnormality. Arctic sun (normothermia) stopped 10/3.  -Continue Lisinopril 10 mg  -Continue to hold Lasix, amlodipine, metoprolol  Bilateral inguinal hernia Partial small bowel obstruction Large left hydrocele, Testicular [birth year]1970 on 10/10 showed large left hydrocele and left inguinal hernia. No evidence of testicular mass. Discussed with IR yesterday regarding PEG tube placement.  CT abdomen pelvis  showed right incarcerated inguinal hernia and small bowel obstruction and large left inguinal hernia.  Abdominal x-ray showed small bowel obstruction. Surgery recommended that pt is not clinically obstructed, he is having BMs. He is not a candidate for inguinal hernia repair. Will defer to IR for PEG -IR following, appreciate recs. Discussed with Dr 12/10. He will likely have a PEG later this week. -Monitor left scrotal swelling  At Risk Malnutrition  Nausea / Vomiting  -TF per Nutrition, restart feeds today -Continue miralax -Pending PEG insertion later this week   Hypokalemia, resolved  -Daily BMP -Replete as necessary   Hx ETOH abuse  -Continue thiamine, folate  HIV, AIDs Reportedly not taking meds. CD4 124 -Started on Bactrim for PCP prophylaxis on 10/11 -Restarted home meds: Descovy and Tivicay on 10/11   Best practice:  Diet: Trickle TF Pain/Anxiety/Delirium protocol (if indicated): PAD protocol  VAP protocol (if indicated): ordered DVT prophylaxis: SQ heparin  GI prophylaxis: PPI Glucose control: n/a Mobility: BR Code Status: full code Family Communication: Will update daughter  Disposition: ICU  Labs   CBC: Recent Labs  Lab 11/09/19 0354 11/10/19 0438 11/11/19 0555 11/14/19 0920 11/15/19 0116  WBC 7.2 7.0 8.8 3.8* 3.8*  NEUTROABS  --   --   --  2.6  --   HGB 12.3* 12.5* 13.5 11.8* 11.5*  HCT 38.6* 39.0 43.4 37.9* 36.5*  MCV 96.5 94.9 96.7 98.2 96.3  PLT 229 252 256 248 219    Basic Metabolic Panel: Recent Labs  Lab 11/09/19 0354 11/10/19  7782 11/11/19 0555 11/14/19 0920 11/15/19 0116  NA 142 141 143 144 145  K 3.5 3.5 3.5 3.3* 3.5  CL 107 105 103 114* 116*  CO2 24 24 26  19* 21*  GLUCOSE 138* 115* 100* 107* 100*  BUN 18 19 25* 19 15  CREATININE 0.60* 0.59* 0.83 0.78 0.68  CALCIUM 9.1 10.0 10.6* 9.2 9.4  MG  --  2.3  --   --   --    GFR: Estimated Creatinine Clearance: 103.2 mL/min (by C-G formula based on SCr of 0.68 mg/dL). Recent  Labs  Lab 11/09/19 0354 11/09/19 0354 11/10/19 0438 11/11/19 0555 11/14/19 0920 11/15/19 0116  PROCALCITON 0.11  --   --   --   --   --   WBC 7.2   < > 7.0 8.8 3.8* 3.8*   < > = values in this interval not displayed.    Liver Function Tests: Recent Labs  Lab 11/10/19 0438 11/11/19 0555 11/14/19 0920 11/15/19 0116  AST 32 43* 46* 45*  ALT 19 20 24 26   ALKPHOS 77 95 115 125  BILITOT 0.8 0.7 0.3 0.5  PROT 6.9 7.9 6.7 6.7  ALBUMIN 2.7* 3.0* 2.8* 2.8*   No results for input(s): LIPASE, AMYLASE in the last 168 hours. No results for input(s): AMMONIA in the last 168 hours.  ABG    Component Value Date/Time   PHART 7.408 11/02/2019 0505   PCO2ART 38.7 11/02/2019 0505   PO2ART 103 11/02/2019 0505   HCO3 24.4 11/02/2019 0505   TCO2 26 11/02/2019 0505   ACIDBASEDEF 7.0 (H) 11/01/2019 2118   O2SAT 98.0 11/02/2019 0505     Coagulation Profile: No results for input(s): INR, PROTIME in the last 168 hours.  Cardiac Enzymes: No results for input(s): CKTOTAL, CKMB, CKMBINDEX, TROPONINI in the last 168 hours.  HbA1C: No results found for: HGBA1C  CBG: Recent Labs  Lab 11/14/19 1129 11/14/19 1522 11/14/19 1951 11/14/19 2344 11/15/19 0351  GLUCAP 103* 83 97 91 91   CC time:   01/14/20 MD PGY-2, Resident    See 11/17/19 for Pager Details

## 2019-11-15 NOTE — Progress Notes (Signed)
eLink Physician-Brief Progress Note Patient Name: Ryan Orozco DOB: 1965-03-24 MRN: [id number]   Date of Service  11/15/2019  HPI/Events of Note  Partial small bowel obstruction on recent KUB X-ray.  eICU Interventions  Hold enteric feeding,  Interval re-imaging of the abdomen, NG tube to LIS, if obstruction appears to progress will need stat CT abdomen & pelvis to r/o a surgical abdomen.        [name] 11/15/2019, 5:31 AM

## 2019-11-16 LAB — GLUCOSE, CAPILLARY
Glucose-Capillary: 105 mg/dL — ABNORMAL HIGH (ref 70–99)
Glucose-Capillary: 116 mg/dL — ABNORMAL HIGH (ref 70–99)
Glucose-Capillary: 107 mg/dL — ABNORMAL HIGH (ref 70–99)
Glucose-Capillary: 102 mg/dL — ABNORMAL HIGH (ref 70–99)
Glucose-Capillary: 108 mg/dL — ABNORMAL HIGH (ref 70–99)
Glucose-Capillary: 105 mg/dL — ABNORMAL HIGH (ref 70–99)

## 2019-11-16 MED ORDER — CEFAZOLIN SODIUM-DEXTROSE 2-4 GM/100ML-% IV SOLN
2.0000 g | INTRAVENOUS | Status: AC
  Administered 2019-11-17: 2 g via INTRAVENOUS

## 2019-11-16 MED ORDER — CHLORHEXIDINE GLUCONATE 0.12 % MT SOLN
OROMUCOSAL | Status: AC
  Filled 2019-11-16: qty 15

## 2019-11-16 MED ORDER — HEPARIN SODIUM (PORCINE) 5000 UNIT/ML IJ SOLN
5000.0000 [IU] | Freq: Three times a day (TID) | INTRAMUSCULAR | Status: DC
  Administered 2019-11-16 – 2020-04-08 (×428): 5000 [IU] via SUBCUTANEOUS
  Filled 2019-11-16 (×426): qty 1

## 2019-11-16 NOTE — Evaluation (Signed)
Physical Therapy Evaluation Patient Details Name: Ryan Orozco MRN: [id number] DOB: October 16, 1965 Today's Date: 11/16/2019   History of Present Illness  54 year old male with HIV AIDS and alcohol abuse, admitted with anoxic brain injury from PEA arrest.  Trached, PEG pending.  Clinical Impression  Pt admitted with/for anoxic brain injury after PEA arrest.  At time of the evaluation, pt did not make any purposeful movement to any stimuli, general movement to painful stimuli.Marland Kitchen  Pt currently limited functionally due to the problems listed. ( See problems list.)   Pt will benefit from PT to maximize function and safety in order to get ready for next venue listed below.     Follow Up Recommendations SNF;Supervision/Assistance - 24 hour    Equipment Recommendations  Other (comment) (TBA based on how he progresses)    Recommendations for Other Services       Precautions / Restrictions Precautions Precautions: Other (comment) (trached, HOB precaution)      Mobility  Bed Mobility Overal bed mobility: Needs Assistance Bed Mobility: Rolling Rolling: Total assist         General bed mobility comments: use of bed to sit upright due to no assist from pt.  Transfers                    Ambulation/Gait                Stairs            Wheelchair Mobility    Modified Rankin (Stroke Patients Only)       Balance                                             Pertinent Vitals/Pain Pain Assessment: Faces Faces Pain Scale: No hurt Pain Intervention(s): Monitored during session;Other (comment) (non or minimally purposeful response to pain or discomfort)    Home Living Family/patient expects to be discharged to:: Unsure                 Additional Comments: pt was independent, living with brother who has since passed during this admission    Prior Function Level of Independence: Independent               Hand Dominance         Extremity/Trunk Assessment   Upper Extremity Assessment Upper Extremity Assessment: Defer to OT evaluation (minimal spontaneous movement noted)    Lower Extremity Assessment Lower Extremity Assessment: RLE deficits/detail;LLE deficits/detail RLE Deficits / Details: minimal spontaneous movement noted during eval, resistance to gross flexion. LLE Deficits / Details: minimal spontaneous movement noted during eval, resistance to gross flexion.       Communication   Communication: No difficulties  Cognition Arousal/Alertness:  (no directed responses.) Behavior During Therapy: Flat affect Overall Cognitive Status: Impaired/Different from baseline                                 General Comments: no acknowlegement of his surrondings, no purposeful responses, general w/d to pain.      General Comments General comments (skin integrity, edema, etc.): pt on vent at 30% FiO2 at SpO2 100%,  RR in the 20's and 30's,  Pt's dtr present for most of my assessment.    Exercises Other Exercises Other Exercises: PROM to all 4's, neck  and even trunk during rolling.   Assessment/Plan    PT Assessment Patient needs continued PT services  PT Problem List Decreased strength;Decreased mobility;Decreased cognition;Cardiopulmonary status limiting activity;Decreased safety awareness       PT Treatment Interventions      PT Goals (Current goals can be found in the Care Plan section)  Acute Rehab PT Goals Patient Stated Goal: dtr hoping for improvement PT Goal Formulation: Patient unable to participate in goal setting Time For Goal Achievement: 11/30/19 Potential to Achieve Goals: Fair (to poor)    Frequency Min 2X/week   Barriers to discharge        Co-evaluation               AM-PAC PT "6 Clicks" Mobility  Outcome Measure Help needed turning from your back to your side while in a flat bed without using bedrails?: Total Help needed moving from lying on your back to  sitting on the side of a flat bed without using bedrails?: Total Help needed moving to and from a bed to a chair (including a wheelchair)?: Total Help needed standing up from a chair using your arms (e.g., wheelchair or bedside chair)?: Total Help needed to walk in hospital room?: Total Help needed climbing 3-5 steps with a railing? : Total 6 Click Score: 6    End of Session Equipment Utilized During Treatment: Oxygen Activity Tolerance: Patient tolerated treatment well Patient left: in bed;with call bell/phone within reach;with bed alarm set;with family/visitor present;with nursing/sitter in room Nurse Communication: Mobility status PT Visit Diagnosis: Other symptoms and signs involving the nervous system (R29.898);Other abnormalities of gait and mobility (R26.89)    Time: 1241-1318 PT Time Calculation (min) (ACUTE ONLY): 37 min   Charges:   PT Evaluation $PT Eval Moderate Complexity: 1 Mod PT Treatments $Therapeutic Activity: 8-22 mins        11/16/2019  Jacinto Halim., PT Acute Rehabilitation Services [phone]  (pager) [phone]  (office)  Eliseo Gum Brevon Dewald 11/16/2019, 2:48 PM

## 2019-11-16 NOTE — TOC Progression Note (Signed)
Transition of Care [facility]) - Progression Note    Patient Details  Name: Ryan Orozco MRN: [id number] Date of Birth: Apr 10, 1965  Transition of [facility]) CM/SW Contact  Lockie Pares, RN Phone Number: 11/16/2019, 3:36 PM  Clinical Narrative:    Marthe Patch back from [facility], the notes on this case indicate that the [employer] representative was following this case. Her name is Jerrilyn Cairo # [phone]. I left a message for MS Bessant to call me to discuss case further.    Expected Discharge Plan: Long Term Acute Care (LTAC) Barriers to Discharge: Continued Medical Work up  Expected Discharge Plan and Services Expected Discharge Plan: Long Term Acute Care (LTAC)   Discharge Planning Services: CM Consult   Living arrangements for the past 2 months: Single Family Home                                       Social Determinants of Health (SDOH) Interventions    Readmission Risk Interventions No flowsheet data found.

## 2019-11-16 NOTE — TOC Progression Note (Signed)
Transition of Care [facility]) - Progression Note    Patient Details  Name: Airam Heidecker MRN: [id number] Date of Birth: October 03, 1965  [facility]) CM/SW Contact  Lockie Pares, RN Phone Number: 11/16/2019, 11:27 AM  Clinical Narrative:    Financial counseling has been consulted, at that time, the patients had family planing medicaid only, but looked like he qualified for disability. She had referred him to disability coordinator. I am awaiting response about the progress of this. As the patient is not responsive, , and daughter is perusing trach and PEG, it would be prudent to consult palliative care to establish Goals of care for this patient, as it seems unlikely that he will progress much from his current state.  As he is currently without insurance, he will have to remain in the ICU. He will not qualify for LTACH. If he can get disability and Medicaid, then we can look at long term vent care, or home vent management, with partial care, with primary family support. This will involve coordinated discussions with family, palliative care, nursing, and physicians to come to an understand for what can be done, and what is in the best interest of the  patient and family.  to    Expected Discharge Plan: Long Term Acute Care (LTAC) Barriers to Discharge: Continued Medical Work up  Expected Discharge Plan and Services Expected Discharge Plan: Long Term Acute Care (LTAC)   Discharge Planning Services: CM Consult   Living arrangements for the past 2 months: Single Family Home                                       Social Determinants of Health (SDOH) Interventions    Readmission Risk Interventions No flowsheet data found.

## 2019-11-16 NOTE — Progress Notes (Signed)
  SLP Cancellation Note  Patient Details Name: Ryan Orozco MRN: [id number] DOB: 1965-09-29   Cancelled evaluation: Orders received for swallow/PMV evals.  Pt trached 10/13; remains on vent; anoxic brain injury. Not appropriate at this time for assessments.  Will follow along for readiness.  [name], MA CCC/SLP Acute Rehabilitation Services Office number [phone] Pager [phone]          [name] 11/16/2019, 9:32 AM

## 2019-11-16 NOTE — Progress Notes (Signed)
NAMEKendric Orozco, MRN:  [id number], DOB:  1965-03-02, LOS: 15 ADMISSION DATE:  11/01/2019, CONSULTATION DATE:  9/29 REFERRING MD:  EDP, CHIEF COMPLAINT:  Cardiac arrest    Brief History   55yo male with hx HIV (last CD4 count 191 on 10/12/19), ETOH with recent ER visit 9/27 after being picked up by police intoxicated. Returned 9/29 after being found down outside convenience store.  Pulseless on EMS arrival with PEA.  Unknown downtime prior to EMS arrival. EMS performed 2 rounds of CPR with 2 epi before ROSC.  Admitted to ICU.   In ER lactate 4.4 post CPR, troponin 450, electrolytes wnl, ETOH 177  Past Medical History  ETOH Abuse HIV - dx 1995  Significant Hospital Events   9/29 Admit post PEA arrest.  UDS positive for THC, benzo's.  ETOH 177 10/05 EEG ongoing. Versed restarted overnight due to agitation. Nicardipine increased.  10/06 Developed tachypnea with WUA, no follow commands off sedation  10/07 Vomiting, TF held / restarted with recurrent vomiting  Consults:  Neuro  Procedures:  ETT 9/29 >>10/13 Tracheostomy>>10/3  Significant Diagnostic Tests:  CT head 9/29 >  No evidence of acute intracranial abnormality. Moderate generalized cerebral atrophy, advanced for age. Chronic medially displaced fracture deformity of the right lamina papyracea. Mild ethmoid and maxillary sinus mucosal thickening.  CT cervical spine 9/29 > No evidence of acute fracture to the cervical spine. Partially imaged airspace disease within the imaged lung apices (extensive on the left). Clinical correlation is recommended. 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  EEG 9/29 > Patient was noted to have frequent episodes of axial jerking with eye opening. Concomitant EEG showed generalized polyspikes consistent with myoclonic seizures. EEG also showed continuous generalized background suppression. EEG was not reactive to tactile  stimulation. Hyperventilation and photic stimulation were not performed  ECHO 9/30 > No evidence of wall motion abnormality  MRI 10/3 > Mild DWI hyperintensity involving the caudate and possibly putamen bilaterally. Additionally, possible subtle FLAIR hyperintensity involving the cerebellum, cortex diffusely, and bilateral basal ganglia. Although not diagnostic, these findings could be seen with early hypoxic/ischemic brain injury in this patient status post PEA arrest.   EEG 10/08> severe diffuse encephalopathy likely related to anoxic/hypoxic brain injury  Testicular US 10/10> showed large left hydrocele and left inguinal hernia. No evidence of testicular mass.  MRI brain: evolving hypoxic, anoxic brain injury compared to previous MRI   CT abdomen pelvis 10/12: Right inguinal hernia and partial small bowel obstruction, large left inguinal hernia  Abdominal x-ray: Small bowel obstruction  Micro Data:  BCx2 9/29 >> negative  Urine 9/29 >> UA few bacteria, WBC, RBC, CaOxalate, and Mucus Covid, Flu A/B 9/29 >> negative Tracheal aspirate 10/5 >> normal flora   Antimicrobials:  Vancomycin 9/29 >> 9/30 Cefepime 9/29 >> 9/30 Cefepime 10/5 >> 10/11  Interim history/subjective:  No acute events overnight. UOP 2.37L overnight.  Objective   Blood pressure (!) 130/91, pulse 77, temperature 99.6 F (37.6 C), temperature source Oral, resp. rate 19, height 5\' 6"  (1.676 m), weight 77.6 kg, SpO2 99 %.    Vent Mode: PRVC FiO2 (%):  [30 %] 30 % Set Rate:  [20 bmp] 20 bmp Vt Set:  [510 mL] 510 mL PEEP:  [5 cmH20] 5 cmH20 Pressure Support:  [10 cmH20] 10 cmH20 Plateau Pressure:  [15 cmH20-16 cmH20] 15 cmH20   Intake/Output Summary (Last 24 hours) at 11/16/2019 0739 Last data  filed at 11/16/2019 0600 Gross per 24 hour  Intake 1898.61 ml  Output 2375 ml  Net -476.39 ml   Filed Weights   11/13/19 0500 11/15/19 0400 11/16/19 0500  Weight: 74.9 kg 77 kg 77.6 kg    Examination:   General: critically ill appearing AA male, tracheostomy in place  HEENT: NCAT, no scleral icterus  CV: s1 and s2 present, RRR PULM: CTAB, breathing synchronously on vent, trach site clean and dry  GI: abdomen soft, non tender, bowel sounds present  Extremities: no peripheral edema, cyanosis or clubbing  Skin: warm and dry  Neuro: +corneal reflex, PERRLA, no withdrawal to pain in UE or LE  CXR 10/9 >> ETT in good position, NGT in good position, mild bibasilar airspace disease  Resolved Hospital Problem list    Hypokalemia   Assessment & Plan:   Myoclonic seizures, 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  -Continue Keppra 1500mg  BID  -Ongoing goals of his care discussion with daughter, awaiting PEG insertion today -Neuroprotective measures including normoglycemia, normothermia, normonatremia  Acute Hypoxic Respiratory Failure Resp failure requiring airway protection intubation due to post-arrest myoclonic seizures, LUL infiltrates noted on CT and CXR in setting of ETOH hx and Hx of HIV. S/p Vancomycin (9/29-9/30). Cefipime (9/29-9/30, 10/5-10/11). Negative tracheal aspirates.  Patient remains on mechanical ventilation and is unable to tolerate pressure support trials. He remains off sedation. -PRVC 8cc/kg  -VAP protocol -Daily SBT / WUA  Cardiac Arrest Hypertension PEA on EMS arrival. No bystander CPR, unknown downtime. 2 rounds CPR/epi with EMS prior to ROSC. EKG normal sinus rhythm with peaked T waves, K 4.2, Echo normal without wall abnormality. Arctic sun (normothermia) stopped 10/3.  -Continue Lisinopril 10 mg,  Amlodipine 10mg  -Continue to hold Lasix, metoprolol  Bilateral inguinal hernia Partial small bowel obstruction Large left hydrocele Testicular 06-24-1970 on 10/10 showed large left hydrocele and left inguinal hernia. No evidence of testicular mass. CT abdomen pelvis on 10/12 showed bilateral inguinal hernias with right sided incarceration. Gen Surg consulted,  not a surgical candidate. Not obstructed. -A/w PEG placement with IR today  At Risk Malnutrition  Nausea / Vomiting  -TF per Nutrition, hold NG feeds -Continue miralax  Hypokalemia, resolved  -BMP every other day -Replete as necessary   Hx ETOH abuse  -Continue thiamine, folate  HIV, AIDs Reportedly not taking meds. CD4 124 -Started on Bactrim for PCP prophylaxis on 10/11 -Restarted home meds: Descovy and Tivicay on 10/11   Best practice:  Diet: Trickle TF Pain/Anxiety/Delirium protocol (if indicated): PAD protocol  VAP protocol (if indicated): ordered DVT prophylaxis: SQ heparin  GI prophylaxis: PPI Glucose control: n/a Mobility: BR Code Status: full code Family Communication: Will update daughter  Disposition: ICU  Labs   CBC: Recent Labs  Lab 11/10/19 0438 11/11/19 0555 11/14/19 0920 11/15/19 0116  WBC 7.0 8.8 3.8* 3.8*  NEUTROABS  --   --  2.6  --   HGB 12.5* 13.5 11.8* 11.5*  HCT 39.0 43.4 37.9* 36.5*  MCV 94.9 96.7 98.2 96.3  PLT 252 256 248 219    Basic Metabolic Panel: Recent Labs  Lab 11/10/19 0438 11/11/19 0555 11/14/19 0920 11/15/19 0116  NA 141 143 144 145  K 3.5 3.5 3.3* 3.5  CL 105 103 114* 116*  CO2 24 26 19* 21*  GLUCOSE 115* 100* 107* 100*  BUN 19 25* 19 15  CREATININE 0.59* 0.83 0.78 0.68  CALCIUM 10.0 10.6* 9.2 9.4  MG 2.3  --   --   --  GFR: Estimated Creatinine Clearance: 103.5 mL/min (by C-G formula based on SCr of 0.68 mg/dL). Recent Labs  Lab 11/10/19 0438 11/11/19 0555 11/14/19 0920 11/15/19 0116  WBC 7.0 8.8 3.8* 3.8*    Liver Function Tests: Recent Labs  Lab 11/10/19 0438 11/11/19 0555 11/14/19 0920 11/15/19 0116  AST 32 43* 46* 45*  ALT 19 20 24 26   ALKPHOS 77 95 115 125  BILITOT 0.8 0.7 0.3 0.5  PROT 6.9 7.9 6.7 6.7  ALBUMIN 2.7* 3.0* 2.8* 2.8*   No results for input(s): LIPASE, AMYLASE in the last 168 hours. No results for input(s): AMMONIA in the last 168 hours.  ABG    Component Value  Date/Time   PHART 7.408 11/02/2019 0505   PCO2ART 38.7 11/02/2019 0505   PO2ART 103 11/02/2019 0505   HCO3 24.4 11/02/2019 0505   TCO2 26 11/02/2019 0505   ACIDBASEDEF 7.0 (H) 11/01/2019 2118   O2SAT 98.0 11/02/2019 0505     Coagulation Profile: No results for input(s): INR, PROTIME in the last 168 hours.  Cardiac Enzymes: No results for input(s): CKTOTAL, CKMB, CKMBINDEX, TROPONINI in the last 168 hours.  HbA1C: No results found for: HGBA1C  CBG: Recent Labs  Lab 11/15/19 1151 11/15/19 1549 11/15/19 1909 11/15/19 2312 11/16/19 0303  GLUCAP 99 100* 97 102* 105*   CC time:   11/18/19 MD PGY-2, Resident    See [place]ave for Pager Details

## 2019-11-16 NOTE — Progress Notes (Signed)
Attempted to send for this pt to come to IR for procedure. RT is not able to travel until later this afternoon when IR is only available for emergent cases. Rad PA aware, floor RN aware.

## 2019-11-17 ENCOUNTER — Inpatient Hospital Stay (HOSPITAL_COMMUNITY): Payer: Medicaid Other

## 2019-11-17 ENCOUNTER — Encounter: Payer: Self-pay | Admitting: Infectious Diseases

## 2019-11-17 DIAGNOSIS — Z515 Encounter for palliative care: Secondary | ICD-10-CM

## 2019-11-17 DIAGNOSIS — K56609 Unspecified intestinal obstruction, unspecified as to partial versus complete obstruction: Secondary | ICD-10-CM

## 2019-11-17 HISTORY — PX: IR GASTROSTOMY TUBE MOD SED: IMG625

## 2019-11-17 LAB — BASIC METABOLIC PANEL
Anion gap: 9 (ref 5–15)
BUN: 11 mg/dL (ref 6–20)
CO2: 23 mmol/L (ref 22–32)
Calcium: 9.1 mg/dL (ref 8.9–10.3)
Chloride: 106 mmol/L (ref 98–111)
Creatinine, Ser: 0.65 mg/dL (ref 0.61–1.24)
GFR, Estimated: >60 mL/min (ref >60)
Glucose, Bld: 119 mg/dL — ABNORMAL HIGH (ref 70–99)
Potassium: 3 mmol/L — ABNORMAL LOW (ref 3.5–5.1)
Sodium: 138 mmol/L (ref 135–145)

## 2019-11-17 LAB — GLUCOSE, CAPILLARY
Glucose-Capillary: 107 mg/dL — ABNORMAL HIGH (ref 70–99)
Glucose-Capillary: 64 mg/dL — ABNORMAL LOW (ref 70–99)
Glucose-Capillary: 81 mg/dL (ref 70–99)
Glucose-Capillary: 89 mg/dL (ref 70–99)
Glucose-Capillary: 89 mg/dL (ref 70–99)
Glucose-Capillary: 93 mg/dL (ref 70–99)
Glucose-Capillary: 99 mg/dL (ref 70–99)

## 2019-11-17 LAB — CBC
HCT: 37.6 % — ABNORMAL LOW (ref 39.0–52.0)
Hemoglobin: 11.7 g/dL — ABNORMAL LOW (ref 13.0–17.0)
MCH: 29.6 pg (ref 26.0–34.0)
MCHC: 31.1 g/dL (ref 30.0–36.0)
MCV: 95.2 fL (ref 80.0–100.0)
Platelets: 195 10*3/uL (ref 150–400)
RBC: 3.95 MIL/uL — ABNORMAL LOW (ref 4.22–5.81)
RDW: 13 % (ref 11.5–15.5)
WBC: 6.2 10*3/uL (ref 4.0–10.5)
nRBC: 0 % (ref 0.0–0.2)

## 2019-11-17 MED ORDER — LIDOCAINE HCL 1 % IJ SOLN
INTRAMUSCULAR | Status: AC | PRN
  Administered 2019-11-17: 10 mL

## 2019-11-17 MED ORDER — IOHEXOL 300 MG/ML  SOLN
50.0000 mL | Freq: Once | INTRAMUSCULAR | Status: AC | PRN
  Administered 2019-11-17: 10 mL

## 2019-11-17 MED ORDER — PANTOPRAZOLE SODIUM 40 MG IV SOLR
40.0000 mg | Freq: Every day | INTRAVENOUS | Status: DC
  Administered 2019-11-17 – 2019-11-21 (×5): 40 mg via INTRAVENOUS
  Filled 2019-11-17 (×5): qty 40

## 2019-11-17 MED ORDER — POTASSIUM CHLORIDE 10 MEQ/100ML IV SOLN
10.0000 meq | INTRAVENOUS | Status: AC
  Administered 2019-11-17 (×6): 10 meq via INTRAVENOUS
  Filled 2019-11-17 (×5): qty 100

## 2019-11-17 MED ORDER — CEFAZOLIN SODIUM-DEXTROSE 2-4 GM/100ML-% IV SOLN
INTRAVENOUS | Status: AC
  Filled 2019-11-17: qty 100

## 2019-11-17 MED ORDER — LIDOCAINE HCL 1 % IJ SOLN
INTRAMUSCULAR | Status: AC
  Filled 2019-11-17: qty 20

## 2019-11-17 MED ORDER — MIDAZOLAM HCL 2 MG/2ML IJ SOLN
INTRAMUSCULAR | Status: AC
  Filled 2019-11-17: qty 2

## 2019-11-17 MED ORDER — DEXTROSE 10 % IV SOLN: INTRAVENOUS | Status: DC

## 2019-11-17 MED ORDER — MIDAZOLAM HCL 2 MG/2ML IJ SOLN
INTRAMUSCULAR | Status: AC | PRN
  Administered 2019-11-17 (×2): 1 mg via INTRAVENOUS

## 2019-11-17 MED ORDER — FENTANYL CITRATE (PF) 100 MCG/2ML IJ SOLN
INTRAMUSCULAR | Status: AC
  Filled 2019-11-17: qty 2

## 2019-11-17 MED ORDER — FENTANYL CITRATE (PF) 100 MCG/2ML IJ SOLN
INTRAMUSCULAR | Status: AC | PRN
  Administered 2019-11-17 (×2): 50 ug via INTRAVENOUS

## 2019-11-17 MED ORDER — DEXTROSE 50 % IV SOLN
INTRAVENOUS | Status: AC
  Administered 2019-11-17: 25 mL
  Filled 2019-11-17: qty 50

## 2019-11-17 MED ORDER — GLUCAGON HCL RDNA (DIAGNOSTIC) 1 MG IJ SOLR
INTRAMUSCULAR | Status: AC
  Filled 2019-11-17: qty 1

## 2019-11-17 NOTE — Progress Notes (Signed)
Ryan Orozco, MRN:  [id number], DOB:  1965-10-31, LOS: 16 ADMISSION DATE:  11/01/2019, CONSULTATION DATE:  9/29 REFERRING MD:  EDP, CHIEF COMPLAINT:  Cardiac arrest   Brief History   54 y/o male with HIV found down outside a convenience store on 9/27, received out of hospital CPR.  After ICU admission has had acute encephalopathy, concern for anoxic brain injury.  Received a tracheostomy 10/13.    Past Medical History  ETOH Abuse HIV - dx 1995  Significant Hospital Events   9/29 Admit post PEA arrest.  UDS positive for THC, benzo's.  ETOH 177 10/05 EEG ongoing. Versed restarted overnight due to agitation. Nicardipine increased.  10/06 Developed tachypnea with WUA, no follow commands off sedation  10/07 Vomiting, TF held / restarted with recurrent vomiting  Consults:  Neurology  Procedures:  ETT 9/29 >>10/13 Tracheostomy>>10/3  Significant Diagnostic Tests:  CT head 9/29 >  No evidence of acute intracranial abnormality. Moderate generalized cerebral atrophy, advanced for age. Chronic medially displaced fracture deformity of the right lamina papyracea. Mild ethmoid and maxillary sinus mucosal thickening.  CT cervical spine 9/29 > No evidence of acute fracture to the cervical spine. Partially imaged airspace disease within the imaged lung apices (extensive on the left). Clinical correlation is recommended. 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  EEG 9/29 > Patient was noted to have frequent episodes of axial jerking with eye opening. Concomitant EEG showed generalized polyspikes consistent with myoclonic seizures. EEG also showed continuous generalized background suppression. EEG was not reactive to tactile stimulation. Hyperventilation and photic stimulation were not performed  ECHO 9/30 > No evidence of wall motion abnormality  MRI 10/3 > Mild DWI hyperintensity involving the caudate and  possibly putamen bilaterally. Additionally, possible subtle FLAIR hyperintensity involving the cerebellum, cortex diffusely, and bilateral basal ganglia. Although not diagnostic, these findings could be seen with early hypoxic/ischemic brain injury in this patient status post PEA arrest.   EEG 10/08> severe diffuse encephalopathy likely related to anoxic/hypoxic brain injury  Testicular US 10/10> showed large left hydrocele and left inguinal hernia. No evidence of testicular mass.  MRI brain 10/11: evolving hypoxic, anoxic brain injury compared to previous MRI   CT abdomen pelvis 10/12: Right inguinal hernia and partial small bowel obstruction, large left inguinal hernia  Abdominal x-ray: Small bowel obstruction  Micro Data:  BCx2 9/29 >> negative  Urine 9/29 >> UA few bacteria, WBC, RBC, CaOxalate, and Mucus Covid, Flu A/B 9/29 >> negative Tracheal aspirate 10/5 >> normal flora   Antimicrobials:  Vancomycin 9/29 >> 9/30 Cefepime 9/29 >> 9/30 Cefepime 10/5 >> 10/11  Interim history/subjective:  Tracheostomy on 10/13 PEG pending Has been on pressure support two days Remains on keppra   Objective   Blood pressure 124/75, pulse 73, temperature (!) 100.9 F (38.3 C), temperature source Oral, resp. rate 18, height 5\' 6"  (1.676 m), weight 76.4 kg, SpO2 99 %.    Vent Mode: CPAP;PSV FiO2 (%):  [30 %] 30 % Set Rate:  [20 bmp] 20 bmp Vt Set:  [510 mL] 510 mL PEEP:  [5 cmH20] 5 cmH20 Pressure Support:  [10 cmH20] 10 cmH20 Plateau Pressure:  [16 cmH20] 16 cmH20   Intake/Output Summary (Last 24 hours) at 11/17/2019 0907 Last data filed at 11/17/2019 0800 Gross per 24 hour  Intake 1012.55 ml  Output 670 ml  Net 342.55 ml   Filed Weights   11/15/19 0400 11/16/19 0500  11/17/19 0600  Weight: 77 kg 77.6 kg 76.4 kg    Examination:  General:  Resting comfortably in bed HENT: NCAT OP clear tracheostomy in place PULM: CTA B, normal effort CV: RRR, no mgr GI: BS+, soft,  nontender MSK: normal bulk and tone Neuro: no eye opening or motor response to pain or voice   Resolved Hospital Problem list     Assessment & Plan:  Myoclonic seizures, 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 : neurology feels that "it is likely that patient will continue to have more neurologic recovery and possibly improvement in speech" Keppra Daily neuro exams  Acute hypoxemic respiratory failure Tracheostomy collar trial today Trach management per routine  Cardiac arrest Hypertension Continue amlodipine and lisinopril  Bilateral inguinal hernia Partial small bowel obstruction Left hydrocele Monitor phyical exam  Protein calorie malnutrition Continue tube feeding PEG today  EtOH abuse Thiamine, folate  HIV/AIDS tivacay bactrim  Best practice:  Diet: tube feeding Pain/Anxiety/Delirium protocol (if indicated): n/a VAP protocol (if indicated): yes DVT prophylaxis: sub q heparin GI prophylaxis: pantoprazole Glucose control: SSI Mobility: bed rest Code Status: full Family Communication: none bedside Disposition:   Labs   CBC: Recent Labs  Lab 11/11/19 0555 11/14/19 0920 11/15/19 0116 11/17/19 0108  WBC 8.8 3.8* 3.8* 6.2  NEUTROABS  --  2.6  --   --   HGB 13.5 11.8* 11.5* 11.7*  HCT 43.4 37.9* 36.5* 37.6*  MCV 96.7 98.2 96.3 95.2  PLT 256 248 219 195    Basic Metabolic Panel: Recent Labs  Lab 11/11/19 0555 11/14/19 0920 11/15/19 0116 11/17/19 0108  NA 143 144 145 138  K 3.5 3.3* 3.5 3.0*  CL 103 114* 116* 106  CO2 26 19* 21* 23  GLUCOSE 100* 107* 100* 119*  BUN 25* 19 15 11   CREATININE 0.83 0.78 0.68 0.65  CALCIUM 10.6* 9.2 9.4 9.1   GFR: Estimated Creatinine Clearance: 95.3 mL/min (by C-G formula based on SCr of 0.65 mg/dL). Recent Labs  Lab 11/11/19 0555 11/14/19 0920 11/15/19 0116 11/17/19 0108  WBC 8.8 3.8* 3.8* 6.2    Liver Function Tests: Recent Labs  Lab 11/11/19 0555 11/14/19 0920 11/15/19 0116  AST 43* 46* 45*  ALT 20 24  26   ALKPHOS 95 115 125  BILITOT 0.7 0.3 0.5  PROT 7.9 6.7 6.7  ALBUMIN 3.0* 2.8* 2.8*   No results for input(s): LIPASE, AMYLASE in the last 168 hours. No results for input(s): AMMONIA in the last 168 hours.  ABG    Component Value Date/Time   PHART 7.408 11/02/2019 0505   PCO2ART 38.7 11/02/2019 0505   PO2ART 103 11/02/2019 0505   HCO3 24.4 11/02/2019 0505   TCO2 26 11/02/2019 0505   ACIDBASEDEF 7.0 (H) 11/01/2019 2118   O2SAT 98.0 11/02/2019 0505     Coagulation Profile: No results for input(s): INR, PROTIME in the last 168 hours.  Cardiac Enzymes: No results for input(s): CKTOTAL, CKMB, CKMBINDEX, TROPONINI in the last 168 hours.  HbA1C: No results found for: HGBA1C  CBG: Recent Labs  Lab 11/16/19 1538 11/16/19 1912 11/16/19 2311 11/17/19 0312 11/17/19 0713  GLUCAP 102* 108* 105* 107* 93     Critical care time: 31 minutes    11/19/19, MD [place] [facility] Pager: [phone] Cell: [phone] If no response, call [phone]

## 2019-11-17 NOTE — Progress Notes (Signed)
TF held at 0000 for scheduled PEG placement today. Will hold off 0600 Hep 5,000 Subcutaneous.

## 2019-11-17 NOTE — Progress Notes (Addendum)
Called [facility] to notify them of Temp of 102.3.Marland KitchenMarland Kitchen Pt is currently NPO and unable to use PEG until 11/18/2019  I've placed ice packs on bilateral axillary/chest   Requested IV Ofirmev but not given.   Also called to request Protonix IV instead of down the tube... awaiting orders.   Will continue to monitor temperature.

## 2019-11-17 NOTE — Progress Notes (Signed)
eLink Physician-Brief Progress Note Patient Name: Ryan Orozco DOB: 07-Oct-1965 MRN: [id number]   Date of Service  11/17/2019  HPI/Events of Note  Hypoglycemia - Blood glucose = 64 and 2. Fever to 102.3 F -Request for Tylenol. AST mildly elevated, therefore, can't use Tylenol.   eICU Interventions  Plan: 1. D10W to run IV at 40 mL/hour.  2. Ice packs PRN.      Intervention Category Major Interventions: Other:  [name] 11/17/2019, 9:35 PM

## 2019-11-17 NOTE — TOC Progression Note (Signed)
Transition of Care [facility]) - Progression Note    Patient Details  Name: Ryan Orozco MRN: [id number] Date of Birth: [birth year]  Transition of Care [facility]) CM/SW Contact  Lockie Pares, RN Phone Number: 11/17/2019, 4:13 PM  Clinical Narrative:    Discussed in rounds, while patient is not looking like he is responding at this time, neurology has written that he may have some recovery. Palliative consult initiated for goals of care, to speak with family regarding his past wishes vocalizaed, and what he would want in the long term with several different scenarios. Currently due to undergo PEG placement, already trached on ventilator support. Uninsured, have financial concealing and [employer] assisting in establishment of Disability and Medicaid eligibility. Once this is established then we can move forward with plans for post hospitalization.   Expected Discharge Plan: Long Term Acute Care (LTAC) Barriers to Discharge: Continued Medical Work up  Expected Discharge Plan and Services Expected Discharge Plan: Long Term Acute Care (LTAC)   Discharge Planning Services: CM Consult   Living arrangements for the past 2 months: Single Family Home                                       Social Determinants of Health (SDOH) Interventions    Readmission Risk Interventions No flowsheet data found.

## 2019-11-17 NOTE — Sedation Documentation (Signed)
IR Tech attempting to place OG tube for G tube placement. Patient is biting tubing and clamped down. Will give sedation in attempts to pass tubing

## 2019-11-17 NOTE — Progress Notes (Signed)
Hypoglycemic Event  CBG: 64 at 1950 on 11/17/19  Treatment: D50 25 mL (12.5 gm)  Symptoms: None  Follow-up CBG: Time:2035 CBG Result:89   Possible Reasons for Event: Inadequate meal intake  Comments/MD notified:Yes, notified eLink and spoke w/Cameron RN... awaiting orders.     Ryan Orozco

## 2019-11-17 NOTE — Progress Notes (Signed)
eLink Physician-Brief Progress Note Patient Name: Ryan Orozco DOB: [birth year] MRN: [id number]   Date of Service  11/17/2019  HPI/Events of Note  NGT placed to LIS. Request to change Protonix from per tube to IV.   eICU Interventions  Plan: 1. D/C Protonix per tube. 2. Protonix IV.      Intervention Category Major Interventions: Other:  [name] 11/17/2019, 10:17 PM

## 2019-11-17 NOTE — Procedures (Signed)
Interventional Radiology Procedure Note  Procedure: Placement of percutaneous 46F pull-through gastrostomy tube.  Complications: None  Recommendations: - NPO except for sips and chips remainder of today and overnight - Maintain G-tube to LWS until tomorrow morning  - May advance diet as tolerated and begin using tube tomorrow morning   Marliss Coots, MD Pager: [phone]

## 2019-11-17 NOTE — Consult Note (Addendum)
Consultation Note Date: 11/17/2019   Patient Name: Ryan Orozco  DOB: [birth year]  MRN: [id number]  Age / Sex: 54 y.o., male  PCP: Default, Provider, MD Referring Physician: Lupita Leash, MD  Reason for Consultation: Establishing goals of care  HPI/Patient Profile: 54 y.o. male  with past medical history of HIV, ETOH abuse, bilateral inguinal hernia, who was admitted on 11/01/2019 after PEA cardiac arrest.   Imaging raises concern for anoxic brain injury.  He received a tracheostomy on 10/13 and is having a PEG placed by IR today.  Clinical Assessment and Goals of Care:  I have reviewed medical records including EPIC notes, labs and imaging, received report from ICU RN, and called his daughter and surrogate decision maker, Ryan Orozco  to discuss diagnosis prognosis, GOC, EOL wishes, disposition and options.  I introduced Palliative Medicine as specialized medical care for people living with serious illness. It focuses on providing relief from the symptoms and stress of a serious illness.   We discussed a brief life review of the patient.  Mr. Leadbetter was not working but living independently at home with his brother.  He has two children, Ryan Orozco and her brother.  Unfortunately the patient's son is incarcerated and unable to assist.  He also has a sister, Ryan Orozco, who has been supporting Ryan Orozco.  Unfortunately Mr. Ruscitti's brother (with whom he lived) had a heart attack and died since his brother's admission.  He was not working and had a problem with alcoholism.  His daughter Ryan Orozco who lives in [place], tells me she had recently reconnected with him.   She had encouraged him to obtain a PCP and start following up with his medical care as he should.  We discussed his current illness and what it means in the larger context of his on-going co-morbidities.  Ryan Orozco understands he likely won't  do well but she felt strongly she wanted the trach and PEG placed to give him more time.  Ryan Orozco and I agreed to meet at bedside at 10:00 on 10/16 for further conversation.  Questions and concerns were addressed.  The family was encouraged to call with questions or concerns.    Primary Decision Maker:  NEXT OF KIN daughter Ryan Orozco, supported by patient's sister Ryan Orozco    SUMMARY OF RECOMMENDATIONS    PMT follow up 10:00 on 10/16.    Code Status/Advance Care Planning: Full. - to be discussed 10/16  Symptom Management:   Per primary team.  Additional Recommendations (Limitations, Scope, Preferences):  Full Scope Treatment  Palliative Prophylaxis:   Frequent Pain Assessment  Psycho-social/Spiritual:   Desire for further Chaplaincy support: not discussed.   Prognosis: concerning for heart disease, recurrent infection, bowel obstruction, bedbound and immobile.  Patient is at high risk for acute decline and rehospitalization or death.  Discharge Planning: To Be Determined      Primary Diagnoses: Present on Admission: . Cardiac arrest (HCC)   I have reviewed the medical record, interviewed the patient and family, and examined the patient. The following aspects  are pertinent.  Past Medical History:  Diagnosis Date  . Alcohol abuse   . HIV (human immunodeficiency virus infection) (HCC) 1995   Social History   Socioeconomic History  . Marital status: Single    Spouse name: Not on file  . Number of children: Not on file  . Years of education: Not on file  . Highest education level: Not on file  Occupational History  . Not on file  Tobacco Use  . Smoking status: Never Smoker  . Smokeless tobacco: Never Used  Substance and Sexual Activity  . Alcohol use: Yes    Comment: occasionally  . Drug use: No  . Sexual activity: Yes    Partners: Female    Birth control/protection: Condom  Other Topics Concern  . Not on file  Social History Narrative  .  Not on file   Social Determinants of Health   Financial Resource Strain:   . Difficulty of Paying Living Expenses: Not on file  Food Insecurity:   . Worried About Programme researcher, broadcasting/film/video in the Last Year: Not on file  . Ran Out of Food in the Last Year: Not on file  Transportation Needs:   . Lack of Transportation (Medical): Not on file  . Lack of Transportation (Non-Medical): Not on file  Physical Activity:   . Days of Exercise per Week: Not on file  . Minutes of Exercise per Session: Not on file  Stress:   . Feeling of Stress : Not on file  Social Connections:   . Frequency of Communication with Friends and Family: Not on file  . Frequency of Social Gatherings with Friends and Family: Not on file  . Attends Religious Services: Not on file  . Active Member of Clubs or Organizations: Not on file  . Attends Banker Meetings: Not on file  . Marital Status: Not on file   Family History  Problem Relation Age of Onset  . Hypertension Mother   . Throat cancer Mother   . Lung cancer Father   . Cancer Maternal Grandfather     Allergies  Allergen Reactions  . Tomato Hives      Vital Signs: BP 135/90   Pulse 83   Temp 99 F (37.2 C) (Axillary)   Resp 18   Ht 5\' 6"  (1.676 m)   Wt 76.4 kg   SpO2 99%   BMI 27.19 kg/m  Pain Scale: CPOT       SpO2: SpO2: 99 % O2 Device:SpO2: 99 % O2 Flow Rate: .O2 Flow Rate (L/min): 5 L/min    Palliative Assessment/Data:  10%     Time In: 4:00 Time Out: 5:00 Time Total: 60 min. Visit consisted of counseling and education dealing with the complex and emotionally intense issues surrounding the need for palliative care and symptom management in the setting of serious and potentially life-threatening illness. Greater than 50%  of this time was spent counseling and coordinating care related to the above assessment and plan.  Signed by: , PA-C Palliative Medicine  Please contact Palliative Medicine Team  phone at [phone] for questions and concerns.  For individual provider: See [phone]

## 2019-11-17 NOTE — Progress Notes (Signed)
K+ 3.0 °Replaced per protocol ° °

## 2019-11-17 NOTE — Sedation Documentation (Signed)
Patient transported back to 15M with RRT.

## 2019-11-18 DIAGNOSIS — K56609 Unspecified intestinal obstruction, unspecified as to partial versus complete obstruction: Secondary | ICD-10-CM

## 2019-11-18 DIAGNOSIS — K5669 Other partial intestinal obstruction: Secondary | ICD-10-CM

## 2019-11-18 DIAGNOSIS — Z515 Encounter for palliative care: Secondary | ICD-10-CM

## 2019-11-18 LAB — CBC
HCT: 38.3 % — ABNORMAL LOW (ref 39.0–52.0)
Hemoglobin: 12.2 g/dL — ABNORMAL LOW (ref 13.0–17.0)
MCH: 30.3 pg (ref 26.0–34.0)
MCHC: 31.9 g/dL (ref 30.0–36.0)
MCV: 95 fL (ref 80.0–100.0)
Platelets: 176 10*3/uL (ref 150–400)
RBC: 4.03 MIL/uL — ABNORMAL LOW (ref 4.22–5.81)
RDW: 12.8 % (ref 11.5–15.5)
WBC: 6.9 10*3/uL (ref 4.0–10.5)
nRBC: 0 % (ref 0.0–0.2)

## 2019-11-18 LAB — GLUCOSE, CAPILLARY
Glucose-Capillary: 100 mg/dL — ABNORMAL HIGH (ref 70–99)
Glucose-Capillary: 112 mg/dL — ABNORMAL HIGH (ref 70–99)
Glucose-Capillary: 77 mg/dL (ref 70–99)
Glucose-Capillary: 80 mg/dL (ref 70–99)
Glucose-Capillary: 85 mg/dL (ref 70–99)
Glucose-Capillary: 94 mg/dL (ref 70–99)

## 2019-11-18 LAB — COMPREHENSIVE METABOLIC PANEL
ALT: 18 U/L (ref 0–44)
AST: 29 U/L (ref 15–41)
Albumin: 2.8 g/dL — ABNORMAL LOW (ref 3.5–5.0)
Alkaline Phosphatase: 120 U/L (ref 38–126)
Anion gap: 11 (ref 5–15)
BUN: 8 mg/dL (ref 6–20)
CO2: 23 mmol/L (ref 22–32)
Calcium: 9.4 mg/dL (ref 8.9–10.3)
Chloride: 103 mmol/L (ref 98–111)
Creatinine, Ser: 0.66 mg/dL (ref 0.61–1.24)
GFR, Estimated: >60 mL/min (ref >60)
Glucose, Bld: 98 mg/dL (ref 70–99)
Potassium: 3.9 mmol/L (ref 3.5–5.1)
Sodium: 137 mmol/L (ref 135–145)
Total Bilirubin: 0.6 mg/dL (ref 0.3–1.2)
Total Protein: 7.2 g/dL (ref 6.5–8.1)

## 2019-11-18 NOTE — Progress Notes (Signed)
Patient seen for g-tube site check post placement 10/15 in IR.  Insertion site unremarkable, bumper snug to skin, dressed appropriately. No concerns noted.  May begin to use for tube feeds, medications, free water, etc at discretion of primary team.  Further plans per primary team - IR remains available as needed.  Lynnette Caffey, PA-C

## 2019-11-18 NOTE — Progress Notes (Signed)
NAMEAdante Orozco, MRN:  [id number], DOB:  15-Dec-1965, LOS: 17 ADMISSION DATE:  11/01/2019, CONSULTATION DATE:  9/29 REFERRING MD:  EDP, CHIEF COMPLAINT:  Cardiac arrest   Brief History   54 y/o male with HIV found down outside a convenience store on 9/27, received out of hospital CPR.  After ICU admission has had acute encephalopathy, concern for anoxic brain injury.  Received a tracheostomy 10/13.    Past Medical History  ETOH Abuse HIV - dx 1995  Significant Hospital Events   9/29 Admit post PEA arrest.  UDS positive for THC, benzo's.  ETOH 177 10/05 EEG ongoing. Versed restarted overnight due to agitation. Nicardipine increased.  10/06 Developed tachypnea with WUA, no follow commands off sedation  10/07 Vomiting, TF held / restarted with recurrent vomiting  Consults:  Neurology  Procedures:  ETT 9/29 >>10/13 Tracheostomy>>10/3  Significant Diagnostic Tests:  CT head 9/29 >  No evidence of acute intracranial abnormality. Moderate generalized cerebral atrophy, advanced for age. Chronic medially displaced fracture deformity of the right lamina papyracea. Mild ethmoid and maxillary sinus mucosal thickening.  CT cervical spine 9/29 > No evidence of acute fracture to the cervical spine. Partially imaged airspace disease within the imaged lung apices (extensive on the left). Clinical correlation is recommended. 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  EEG 9/29 > Patient was noted to have frequent episodes of axial jerking with eye opening. Concomitant EEG showed generalized polyspikes consistent with myoclonic seizures. EEG also showed continuous generalized background suppression. EEG was not reactive to tactile stimulation. Hyperventilation and photic stimulation were not performed  ECHO 9/30 > No evidence of wall motion abnormality  MRI 10/3 > Mild DWI hyperintensity involving the caudate and  possibly putamen bilaterally. Additionally, possible subtle FLAIR hyperintensity involving the cerebellum, cortex diffusely, and bilateral basal ganglia. Although not diagnostic, these findings could be seen with early hypoxic/ischemic brain injury in this patient status post PEA arrest.   EEG 10/08> severe diffuse encephalopathy likely related to anoxic/hypoxic brain injury  Testicular US 10/10> showed large left hydrocele and left inguinal hernia. No evidence of testicular mass.  MRI brain 10/11: evolving hypoxic, anoxic brain injury compared to previous MRI   CT abdomen pelvis 10/12: Right inguinal hernia and partial small bowel obstruction, large left inguinal hernia  Abdominal x-ray: Small bowel obstruction  Micro Data:  BCx2 9/29 >> negative  Urine 9/29 >> UA few bacteria, WBC, RBC, CaOxalate, and Mucus Covid, Flu A/B 9/29 >> negative Tracheal aspirate 10/5 >> normal flora   Antimicrobials:  Vancomycin 9/29 >> 9/30 Cefepime 9/29 >> 9/30 Cefepime 10/5 >> 10/11  Interim history/subjective:   No acute events Made dnr  Objective   Blood pressure 110/71, pulse 73, temperature 99.2 F (37.3 C), temperature source Axillary, resp. rate 20, height 5\' 6"  (1.676 m), weight 75.9 kg, SpO2 100 %.    Vent Mode: PRVC FiO2 (%):  [28 %-100 %] 30 % Set Rate:  [20 bmp] 20 bmp Vt Set:  [510 mL] 510 mL PEEP:  [5 cmH20] 5 cmH20 Pressure Support:  [10 cmH20] 10 cmH20 Plateau Pressure:  [15 cmH20] 15 cmH20   Intake/Output Summary (Last 24 hours) at 11/18/2019 0736 Last data filed at 11/18/2019 0600 Gross per 24 hour  Intake 904.48 ml  Output 895 ml  Net 9.48 ml   Filed Weights   11/16/19 0500 11/17/19 0600 11/18/19 0400  Weight: 77.6 kg 76.4 kg 75.9 kg  Examination:  General:  Resting comfortably in bed HENT: NCAT OP clear tracheostomy in place PULM: CTA B, normal effort CV: RRR, no mgr GI: BS+, soft, nontender MSK: normal bulk and tone Neuro: no eye opening or motor  response to pain or voice    Resolved Hospital Problem list     Assessment & Plan:  Myoclonic seizures, 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 : neurology feels that "it is likely that patient will continue to have more neurologic recovery and possibly improvement in speech" Keppra Daily neuro exams  Acute hypoxemic respiratory failure Tracheostomy collar trial today Trach management per routine  Cardiac arrest Hypertension Continue amlodipine and lisinopril  Bilateral inguinal hernia Partial small bowel obstruction Left hydrocele Monitor physical exam  Protein calorie malnutrition Continue tube feeding PEG  EtOH abuse Thiamine, folate  HIV/AIDS Tivacay Bactrim  Best practice:  Diet: tube feeding Pain/Anxiety/Delirium protocol (if indicated): n/a VAP protocol (if indicated): yes DVT prophylaxis: sub q heparin GI prophylaxis: pantoprazole Glucose control: SSI Mobility: bed rest Code Status: full Family Communication: none bedside Disposition: Palliative care   Labs   CBC: Recent Labs  Lab 11/14/19 0920 11/15/19 0116 11/17/19 0108 11/18/19 0157  WBC 3.8* 3.8* 6.2 6.9  NEUTROABS 2.6  --   --   --   HGB 11.8* 11.5* 11.7* 12.2*  HCT 37.9* 36.5* 37.6* 38.3*  MCV 98.2 96.3 95.2 95.0  PLT 248 219 195 176    Basic Metabolic Panel: Recent Labs  Lab 11/14/19 0920 11/15/19 0116 11/17/19 0108 11/18/19 0157  NA 144 145 138 137  K 3.3* 3.5 3.0* 3.9  CL 114* 116* 106 103  CO2 19* 21* 23 23  GLUCOSE 107* 100* 119* 98  BUN 19 15 11 8   CREATININE 0.78 0.68 0.65 0.66  CALCIUM 9.2 9.4 9.1 9.4   GFR: Estimated Creatinine Clearance: 95.3 mL/min (by C-G formula based on SCr of 0.66 mg/dL). Recent Labs  Lab 11/14/19 0920 11/15/19 0116 11/17/19 0108 11/18/19 0157  WBC 3.8* 3.8* 6.2 6.9    Liver Function Tests: Recent Labs  Lab 11/14/19 0920 11/15/19 0116 11/18/19 0157  AST 46* 45* 29  ALT 24 26 18   ALKPHOS 115 125 120  BILITOT 0.3 0.5 0.6  PROT 6.7 6.7  7.2  ALBUMIN 2.8* 2.8* 2.8*   No results for input(s): LIPASE, AMYLASE in the last 168 hours. No results for input(s): AMMONIA in the last 168 hours.  ABG    Component Value Date/Time   PHART 7.408 11/02/2019 0505   PCO2ART 38.7 11/02/2019 0505   PO2ART 103 11/02/2019 0505   HCO3 24.4 11/02/2019 0505   TCO2 26 11/02/2019 0505   ACIDBASEDEF 7.0 (H) 11/01/2019 2118   O2SAT 98.0 11/02/2019 0505     Coagulation Profile: No results for input(s): INR, PROTIME in the last 168 hours.  Cardiac Enzymes: No results for input(s): CKTOTAL, CKMB, CKMBINDEX, TROPONINI in the last 168 hours.  HbA1C: No results found for: HGBA1C  CBG: Recent Labs  Lab 11/17/19 1950 11/17/19 2035 11/17/19 2355 11/18/19 0307 11/18/19 0709  GLUCAP 64* 89 89 77 94     Critical care time: 31 minutes    11/20/19, MD [place] [facility] Pager: [phone] Cell: [phone] If no response, call [phone]

## 2019-11-18 NOTE — Progress Notes (Signed)
Daily Progress Note   Patient Name: Ryan Orozco       Date: 11/18/2019 DOB: 1965-05-02  Age: 54 y.o. MRN#: [id number] Attending Physician: Lupita Leash, MD Primary Care Physician: Default, Provider, MD Admit Date: 11/01/2019  Reason for Consultation/Follow-up:  To discuss complex medical decision making related to patient's goals of care  Chart reviewed.  Patient examined at bedside.  Tube feeds were just recently started. Patient was having difficulty managing secretions on trach collar.  Eyes open, Breathing 39 times a minute, coughing, belly breathing.  Subjective: Ryan Orozco called me and politely explained she was unable to meet me as scheduled this morning.  I will attempt to follow up with her in person when she is available.   Assessment: Patient with anoxic brain injury following cardiac arrest and seizures.  Now with Tracheostomy and PEG.  There is concern his mental status will not improve.   Patient Profile/HPI:  54 y.o. male  with past medical history of HIV, ETOH abuse, bilateral inguinal hernia, who was admitted on 11/01/2019 after PEA cardiac arrest.   Imaging raises concern for anoxic brain injury.  He received a tracheostomy on 10/13 and is having a PEG placed 10/15.     Length of Stay: 17   Vital Signs: BP 105/60   Pulse 72   Temp 98.2 F (36.8 C) (Axillary)   Resp 20   Ht 5\' 6"  (1.676 m)   Wt 75.9 kg   SpO2 93%   BMI 27.01 kg/m  SpO2: SpO2: 93 % O2 Device: O2 Device: Ventilator O2 Flow Rate: O2 Flow Rate (L/min): 5 L/min       Palliative Assessment/Data: 10%     Palliative Care Plan    Recommendations/Plan:  PMT will follow with you to assist family in understanding what is likely in the coming months as assessing patient's quality of  life against patient's values as family understands them.  Code Status:  Full code  Prognosis:   Unable to determine.  He is at high risk for acute decline and rehospitalization due to infection, SBO, heart disease.   Discharge Planning:  To Be Determined possibly SNF with Palliative to follow.  Care plan was discussed with RN  Thank you for allowing the Palliative Medicine Team to assist in the care of this patient.  Total time  spent:  25 min.     Greater than 50%  of this time was spent counseling and coordinating care related to the above assessment and plan.  Florentina Jenny, PA-C Palliative Medicine  Please contact Palliative MedicineTeam phone at [phone] for questions and concerns between 7 am - 7 pm.   Please see AMION for individual provider pager numbers.

## 2019-11-19 ENCOUNTER — Inpatient Hospital Stay (HOSPITAL_COMMUNITY): Payer: Medicaid Other

## 2019-11-19 LAB — GLUCOSE, CAPILLARY
Glucose-Capillary: 112 mg/dL — ABNORMAL HIGH (ref 70–99)
Glucose-Capillary: 117 mg/dL — ABNORMAL HIGH (ref 70–99)
Glucose-Capillary: 113 mg/dL — ABNORMAL HIGH (ref 70–99)
Glucose-Capillary: 96 mg/dL (ref 70–99)
Glucose-Capillary: 113 mg/dL — ABNORMAL HIGH (ref 70–99)
Glucose-Capillary: 101 mg/dL — ABNORMAL HIGH (ref 70–99)

## 2019-11-19 LAB — BASIC METABOLIC PANEL
Anion gap: 9 (ref 5–15)
BUN: 19 mg/dL (ref 6–20)
CO2: 23 mmol/L (ref 22–32)
Calcium: 9.2 mg/dL (ref 8.9–10.3)
Chloride: 102 mmol/L (ref 98–111)
Creatinine, Ser: 0.8 mg/dL (ref 0.61–1.24)
GFR, Estimated: >60 mL/min (ref >60)
Glucose, Bld: 118 mg/dL — ABNORMAL HIGH (ref 70–99)
Potassium: 3.6 mmol/L (ref 3.5–5.1)
Sodium: 134 mmol/L — ABNORMAL LOW (ref 135–145)

## 2019-11-19 LAB — CBC
HCT: 39.1 % (ref 39.0–52.0)
Hemoglobin: 12.8 g/dL — ABNORMAL LOW (ref 13.0–17.0)
MCH: 31 pg (ref 26.0–34.0)
MCHC: 32.7 g/dL (ref 30.0–36.0)
MCV: 94.7 fL (ref 80.0–100.0)
Platelets: 177 10*3/uL (ref 150–400)
RBC: 4.13 MIL/uL — ABNORMAL LOW (ref 4.22–5.81)
RDW: 12.6 % (ref 11.5–15.5)
WBC: 7.5 10*3/uL (ref 4.0–10.5)
nRBC: 0 % (ref 0.0–0.2)

## 2019-11-19 MED ORDER — LEVETIRACETAM 100 MG/ML PO SOLN
1500.0000 mg | Freq: Two times a day (BID) | ORAL | Status: DC
  Administered 2019-11-19 – 2020-04-08 (×283): 1500 mg
  Filled 2019-11-19 (×286): qty 15

## 2019-11-19 NOTE — Progress Notes (Signed)
PHARMACIST - PHYSICIAN COMMUNICATION  DR:   Kendrick Fries  CONCERNING: IV to Oral Route Change Policy  RECOMMENDATION: This patient is receiving Keppra by the intravenous route.  Based on criteria approved by the Pharmacy and Therapeutics Committee, the intravenous medication(s) is/are being converted to the equivalent oral dose form(s).   DESCRIPTION: These criteria include:  The patient is eating (either orally or via tube) and/or has been taking other orally administered medications for a least 24 hours  The patient has no evidence of active gastrointestinal bleeding or impaired GI absorption (gastrectomy, short bowel, patient on TNA or NPO).  If you have questions about this conversion, please contact the Pharmacy Department  []   [phone] )  ( [phone] []   [phone] )  [facility] [x]   [phone] )  [facility] []   [phone] )  [facility] []   [phone] )  [facility]   ( [phone], [facility] 11/19/2019 7:37 AM

## 2019-11-19 NOTE — Progress Notes (Signed)
NAMEKendle Orozco, MRN:  [id number], DOB:  Dec 08, 1965, LOS: 18 ADMISSION DATE:  11/01/2019, CONSULTATION DATE:  9/29 REFERRING MD:  EDP, CHIEF COMPLAINT:  Cardiac arrest   Brief History   54 y/o male with HIV found down outside a convenience store on 9/27, received out of hospital CPR.  After ICU admission has had acute encephalopathy, concern for anoxic brain injury.  Received a tracheostomy 10/13.    Past Medical History  ETOH Abuse HIV - dx 1995  Significant Hospital Events   9/29 Admit post PEA arrest.  UDS positive for THC, benzo's.  ETOH 177 10/05 EEG ongoing. Versed restarted overnight due to agitation. Nicardipine increased.  10/06 Developed tachypnea with WUA, no follow commands off sedation  10/07 Vomiting, TF held / restarted with recurrent vomiting 10/16 tracheostomy collar for 9 hours  Consults:  Neurology  Procedures:  ETT 9/29 >>10/13 Tracheostomy>>10/3  Significant Diagnostic Tests:  CT head 9/29 >  No evidence of acute intracranial abnormality. Moderate generalized cerebral atrophy, advanced for age. Chronic medially displaced fracture deformity of the right lamina papyracea. Mild ethmoid and maxillary sinus mucosal thickening.  CT cervical spine 9/29 > No evidence of acute fracture to the cervical spine. Partially imaged airspace disease within the imaged lung apices (extensive on the left). Clinical correlation is recommended. 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  EEG 9/29 > Patient was noted to have frequent episodes of axial jerking with eye opening. Concomitant EEG showed generalized polyspikes consistent with myoclonic seizures. EEG also showed continuous generalized background suppression. EEG was not reactive to tactile stimulation. Hyperventilation and photic stimulation were not performed  ECHO 9/30 > No evidence of wall motion abnormality  MRI 10/3 > Mild DWI  hyperintensity involving the caudate and possibly putamen bilaterally. Additionally, possible subtle FLAIR hyperintensity involving the cerebellum, cortex diffusely, and bilateral basal ganglia. Although not diagnostic, these findings could be seen with early hypoxic/ischemic brain injury in this patient status post PEA arrest.   EEG 10/08> severe diffuse encephalopathy likely related to anoxic/hypoxic brain injury  Testicular US 10/10> showed large left hydrocele and left inguinal hernia. No evidence of testicular mass.  MRI brain 10/11: evolving hypoxic, anoxic brain injury compared to previous MRI   CT abdomen pelvis 10/12: Right inguinal hernia and partial small bowel obstruction, large left inguinal hernia  Abdominal x-ray: Small bowel obstruction  Micro Data:  BCx2 9/29 >> negative  Urine 9/29 >> UA few bacteria, WBC, RBC, CaOxalate, and Mucus Covid, Flu A/B 9/29 >> negative Tracheal aspirate 10/5 >> normal flora   Antimicrobials:  Vancomycin 9/29 >> 9/30 Cefepime 9/29 >> 9/30 Cefepime 10/5 >> 10/11  Interim history/subjective:   No acute events Weaned on trach collar for 9 hours  Objective   Blood pressure 96/69, pulse 81, temperature 98.2 F (36.8 C), temperature source Axillary, resp. rate (!) 25, height 5\' 6"  (1.676 m), weight 75.4 kg, SpO2 99 %.    Vent Mode: CPAP;PSV FiO2 (%):  [28 %-30 %] 30 % Set Rate:  [20 bmp] 20 bmp Vt Set:  [510 mL] 510 mL PEEP:  [5 cmH20] 5 cmH20 Pressure Support:  [5 cmH20] 5 cmH20 Plateau Pressure:  [15 cmH20-16 cmH20] 15 cmH20   Intake/Output Summary (Last 24 hours) at 11/19/2019 0842 Last data filed at 11/19/2019 0600 Gross per 24 hour  Intake 1585.95 ml  Output 670 ml  Net 915.95 ml   Filed Weights   11/17/19  0600 11/18/19 0400 11/19/19 0411  Weight: 76.4 kg 75.9 kg 75.4 kg    Examination:  General:  In bed on vent HENT: NCAT tracheostomy in place PULM: CTA B, vent supported breathing CV: RRR, no mgr GI: PEG in  place, BS+, soft, nontender MSK: normal bulk and tone Neuro: eye opening to voice, turned head to the right side when I gave him painful stimuli on that side, no other motor movement in arms/legs   Resolved Hospital Problem list   Partial small bowel obstruction > resolved  Assessment & Plan:  Myoclonic seizures, 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 Keppra Daily neuro exam  Acute hypoxemic respiratory failure Some frothy secretions on 10/16 Trach collar trial again as long as tolerated Check CXR Goal is to get him off vent to facilitate placement locally in SNF instead of Vent SNF Trach management per routine  Cardiac arrest Hypertension tele Continue amlodipine and lisinopril  Bilateral inguinal hernia Left hydrocele Monitor exam  Protein calorie malnutrition PEG, tube feeding  EtOH abuse Thiamine, folate  HIV/AIDS Bactrim tivicay descovy  Best practice:  Diet: tube feeding Pain/Anxiety/Delirium protocol (if indicated): n/a VAP protocol (if indicated): yes DVT prophylaxis: sub q heparin GI prophylaxis: pantoprazole Glucose control: SSI Mobility: bed rest Code Status: full Family Communication: none bedside, called [place] Orozco for an update on 10/17 Disposition: Palliative care   Labs   CBC: Recent Labs  Lab 11/14/19 0920 11/15/19 0116 11/17/19 0108 11/18/19 0157 11/19/19 0407  WBC 3.8* 3.8* 6.2 6.9 7.5  NEUTROABS 2.6  --   --   --   --   HGB 11.8* 11.5* 11.7* 12.2* 12.8*  HCT 37.9* 36.5* 37.6* 38.3* 39.1  MCV 98.2 96.3 95.2 95.0 94.7  PLT 248 219 195 176 177     Basic Metabolic Panel: Recent Labs  Lab 11/14/19 0920 11/15/19 0116 11/17/19 0108 11/18/19 0157 11/19/19 0407  NA 144 145 138 137 134*  K 3.3* 3.5 3.0* 3.9 3.6  CL 114* 116* 106 103 102  CO2 19* 21* 23 23 23   GLUCOSE 107* 100* 119* 98 118*  BUN 19 15 11 8 19   CREATININE 0.78 0.68 0.65 0.66 0.80  CALCIUM 9.2 9.4 9.1 9.4 9.2   GFR: Estimated Creatinine Clearance: 95.3 mL/min (by C-G formula based on SCr of 0.8 mg/dL). Recent Labs  Lab 11/15/19 0116 11/17/19 0108 11/18/19 0157 11/19/19 0407  WBC 3.8* 6.2 6.9 7.5    Liver Function Tests: Recent Labs  Lab 11/14/19 0920 11/15/19 0116 11/18/19 0157  AST 46* 45* 29  ALT 24 26 18   ALKPHOS 115 125 120  BILITOT 0.3 0.5 0.6  PROT 6.7 6.7 7.2  ALBUMIN 2.8* 2.8* 2.8*   No results for input(s): LIPASE, AMYLASE in the last 168 hours. No results for input(s): AMMONIA in the last 168 hours.  ABG    Component Value Date/Time   PHART 7.408 11/02/2019 0505   PCO2ART 38.7 11/02/2019 0505   PO2ART 103 11/02/2019 0505   HCO3 24.4 11/02/2019 0505   TCO2 26 11/02/2019 0505   ACIDBASEDEF 7.0 (H) 11/01/2019 2118   O2SAT 98.0 11/02/2019 0505  Coagulation Profile: No results for input(s): INR, PROTIME in the last 168 hours.  Cardiac Enzymes: No results for input(s): CKTOTAL, CKMB, CKMBINDEX, TROPONINI in the last 168 hours.  HbA1C: No results found for: HGBA1C  CBG: Recent Labs  Lab 11/18/19 1518 11/18/19 1933 11/18/19 2340 11/19/19 0341 11/19/19 0705  GLUCAP 85 100* 112* 112* 117*     Critical care time: n/a minutes    Heber [place], MD [facility] Pager: [phone] Cell: [phone] If no response, call [phone]

## 2019-11-19 NOTE — Progress Notes (Addendum)
Daily Progress Note   Patient Name: [place] Orozco       Date: 11/19/2019 DOB: 1965/12/02  Age: 54 y.o. MRN#: [id number] Attending Physician: Juanito Doom, MD Primary Care Physician: Default, Provider, MD Admit Date: 11/01/2019  Reason for Consultation/Follow-up: To discuss complex medical decision making related to patient's goals of care  Subjective: Patient intubated and sleeping.  Does not wake to my voice or light touch.  Per RN no changes overnight.  Spoke to [place] Orozco on the phone.  She and her aunt are busy cleaning out her father and uncle's house but they are planning to come to the hospital this afternoon. I asked if she would like to meet in person or would she prefer to speak on the phone now.  We plan to meet in person when she arrives later today.  ADDENDUM:   Met [place] Orozco at bedside late in the day.  She tells me her father looked at her and smiled when she came in and spoke to him.  We went to a private room to talk.  During our conversation I learned that [place] Orozco's grandmother (patient's mother) and her uncle (patient's brother) have both recently passed away. She is trying to provide support to her aunt (patient's only sibling left) who is suffering with depression.    Ryan Orozco lives and works in [place].  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   She tells me about reconnecting in July with her father and encouraging him to take better care of himself.  She was just getting him back on track.  They talked daily on the phone.  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  He was up and out early in the morning - he did not stay in bed.  He does not sound like the type of man who would tolerate a life being bedbound and dependent for  care.  She wants to allow her father sufficient time to recover but does not want to let him suffer developing bedsores / pneumonia /opportunistic infections while bed bound and dependent for care.  She asks what is the right amount of time to wait and how to make those decisions.  I suggested that the more weeks to months that go by the more certain we  will be of exactly what functionality he will regain.  I suggested a hard stop at 3-6 months, and discussed considering a transition to Hospice at that time if he was not improving.  We reviewed a MOST form.  I recommended DNR.  We discussed the option of DNR with full scope support which the MOST form allows for.  Completing the form will be helpful to the SNF or LTAC to know how to care for him.   [place] Orozco will give consideration to the MOST.     I also provided her with a "Hard Choices for [employer]" book as I believe it will help her make more informed decisions.  [place] Orozco needs support - she is challenged with making life / death decisions for her father.   She is a very thoughtful, loving daughter.  In addition to our (Palliative) support,   I offered a Chaplain consult which she gladly accepted.    Assessment: 54 yo gentleman appears to be comfortably sleeping on the vent.  Tolerating tube feeds.   Patient Profile/HPI:  54 y.o.malewith past medical history of HIV, ETOH abuse, bilateral inguinal hernia,who was admitted on9/29/2021after PEA cardiac arrest.Imaging raises concern for anoxic brain injury. He received a tracheostomy on 10/13 and is having a PEG placed 10/15.    Length of Stay: 18   Vital Signs: BP 114/71   Pulse 89   Temp 99.3 F (37.4 C) (Axillary)   Resp (!) 29   Ht 5' 6"  (1.676 m)   Wt 75.4 kg   SpO2 96%   BMI 26.83 kg/m  SpO2: SpO2: 96 % O2 Device: O2 Device: Tracheostomy Collar O2 Flow Rate: O2 Flow Rate (L/min): 5 L/min       Palliative Assessment/Data:  10%     Palliative Care Plan     Recommendations/Plan:  Reviewed MOST form with surrogate decision maker - [place] Orozco.  Hopeful it will be completed prior to discharge.  Encouraged DNR.  [place] Orozco is aware of the burdens that often accompany being bedbound and dependent - including bedsores, pneumonias and infections.  Will request TOC consultation - [place] Orozco inquired about applying for medicaid for her father.  Will request chaplain consultation.  Strongly recommend Palliative outpatient to follow after discharge.  Code Status:  Full code  Prognosis:   Unable to determine.  He is at high risk for acute decline and death or rehospitalization.   Discharge Planning:  To Be Determined  Care plan was discussed with Dr. Lake Bells, ICU RN Apolonio Schneiders and daughter [place] Orozco.  Thank you for allowing the Palliative Medicine Team to assist in the care of this patient.  Total time spent:  45 min.     Greater than 50%  of this time was spent counseling and coordinating care related to the above assessment and plan.  Florentina Jenny, PA-C Palliative Medicine  Please contact Palliative MedicineTeam phone at [phone] for questions and concerns between 7 am - 7 pm.   Please see AMION for individual provider pager numbers.

## 2019-11-19 NOTE — Progress Notes (Deleted)
Daily Progress Note   Patient Name: [place]       Date: 11/19/2019 DOB: 02-19-1965  Age: 54 y.o. MRN#: [id number] Attending Physician: Lupita Leash, MD Primary Care Physician: Default, Provider, MD Admit Date: 11/01/2019  Reason for Consultation/Follow-up: To discuss complex medical decision making related to patient's goals of care  Subjective: Patient intubated and sleeping.  Does not wake to my voice or light touch.  Per RN no changes overnight.  Spoke to [employer] on the phone.  She and her aunt are busy cleaning out her father and uncle's house but they are planning to come to the hospital this afternoon. I asked if she would like to meet in person or would she prefer to speak on the phone now.  We plan to meet in person when she arrives later today.   Assessment: 54 yo gentleman appears to be comfortably sleeping on the vent.  Tolerating tube feeds.   Patient Profile/HPI:  54 y.o.malewith past medical history of HIV, ETOH abuse, bilateral inguinal hernia,who was admitted on9/29/2021after PEA cardiac arrest.Imaging raises concern for anoxic brain injury. He received a tracheostomy on 10/13 and is having a PEG placed 10/15.    Length of Stay: 18   Vital Signs: BP 96/69   Pulse 81   Temp 98.2 F (36.8 C) (Axillary)   Resp (!) 25   Ht 5\' 6"  (1.676 m)   Wt 75.4 kg   SpO2 98%   BMI 26.83 kg/m  SpO2: SpO2: 98 % O2 Device: O2 Device: (S) Tracheostomy Collar O2 Flow Rate: O2 Flow Rate (L/min): 5 L/min       Palliative Assessment/Data:  10%     Palliative Care Plan    Recommendations/Plan:  Plan to meet family when they come to the hospital later today.  Have asked ICU RN to page me.  Would like to elicit patient values from family if  possible and assist them in setting limitations if he does not progress.  Will discuss code status as well.  Strongly recommend Palliative outpatient to follow after discharge.  Code Status:  Full code  Prognosis:   Unable to determine.  He is at high risk for acute decline and death or rehospitalization.   Discharge Planning:  To Be Determined  Care plan was discussed with Dr. Fleet Contras and  daughter [name].  Thank you for allowing the Palliative Medicine Team to assist in the care of this patient.  Total time spent:  15 min.     Greater than 50%  of this time was spent counseling and coordinating care related to the above assessment and plan.  Norvel Richards, PA-C Palliative Medicine  Please contact Palliative MedicineTeam phone at [phone] for questions and concerns between 7 am - 7 pm.   Please see AMION for individual provider pager numbers.

## 2019-11-20 DIAGNOSIS — J9610 Chronic respiratory failure, unspecified whether with hypoxia or hypercapnia: Secondary | ICD-10-CM

## 2019-11-20 LAB — CBC WITH DIFFERENTIAL/PLATELET
Abs Immature Granulocytes: 0.04 10*3/uL (ref 0.00–0.07)
Basophils Absolute: 0 10*3/uL (ref 0.0–0.1)
Basophils Relative: 0 %
Eosinophils Absolute: 0.2 10*3/uL (ref 0.0–0.5)
Eosinophils Relative: 2 %
HCT: 35 % — ABNORMAL LOW (ref 39.0–52.0)
Hemoglobin: 11.4 g/dL — ABNORMAL LOW (ref 13.0–17.0)
Immature Granulocytes: 1 %
Lymphocytes Relative: 8 %
Lymphs Abs: 0.5 10*3/uL — ABNORMAL LOW (ref 0.7–4.0)
MCH: 30.2 pg (ref 26.0–34.0)
MCHC: 32.6 g/dL (ref 30.0–36.0)
MCV: 92.8 fL (ref 80.0–100.0)
Monocytes Absolute: 0.7 10*3/uL (ref 0.1–1.0)
Monocytes Relative: 10 %
Neutro Abs: 5.2 10*3/uL (ref 1.7–7.7)
Neutrophils Relative %: 79 %
Platelets: 187 10*3/uL (ref 150–400)
RBC: 3.77 MIL/uL — ABNORMAL LOW (ref 4.22–5.81)
RDW: 12.4 % (ref 11.5–15.5)
WBC: 6.6 10*3/uL (ref 4.0–10.5)
nRBC: 0 % (ref 0.0–0.2)

## 2019-11-20 LAB — GLUCOSE, CAPILLARY
Glucose-Capillary: 119 mg/dL — ABNORMAL HIGH (ref 70–99)
Glucose-Capillary: 112 mg/dL — ABNORMAL HIGH (ref 70–99)
Glucose-Capillary: 120 mg/dL — ABNORMAL HIGH (ref 70–99)
Glucose-Capillary: 103 mg/dL — ABNORMAL HIGH (ref 70–99)
Glucose-Capillary: 106 mg/dL — ABNORMAL HIGH (ref 70–99)

## 2019-11-20 LAB — COMPREHENSIVE METABOLIC PANEL
ALT: 18 U/L (ref 0–44)
AST: 29 U/L (ref 15–41)
Albumin: 2.6 g/dL — ABNORMAL LOW (ref 3.5–5.0)
Alkaline Phosphatase: 122 U/L (ref 38–126)
Anion gap: 11 (ref 5–15)
BUN: 16 mg/dL (ref 6–20)
CO2: 22 mmol/L (ref 22–32)
Calcium: 9.1 mg/dL (ref 8.9–10.3)
Chloride: 103 mmol/L (ref 98–111)
Creatinine, Ser: 0.5 mg/dL — ABNORMAL LOW (ref 0.61–1.24)
GFR, Estimated: >60 mL/min (ref >60)
Glucose, Bld: 114 mg/dL — ABNORMAL HIGH (ref 70–99)
Potassium: 3.6 mmol/L (ref 3.5–5.1)
Sodium: 136 mmol/L (ref 135–145)
Total Bilirubin: 0.6 mg/dL (ref 0.3–1.2)
Total Protein: 6.9 g/dL (ref 6.5–8.1)

## 2019-11-20 MED ORDER — POTASSIUM CHLORIDE 20 MEQ/15ML (10%) PO SOLN
40.0000 meq | Freq: Once | ORAL | Status: AC
  Administered 2019-11-20: 40 meq
  Filled 2019-11-20: qty 30

## 2019-11-20 NOTE — Progress Notes (Signed)
Frequent copious secretions. E-link notified. Waiting for orders.

## 2019-11-20 NOTE — Progress Notes (Signed)
NAMESilver Orozco, MRN:  [id number], DOB:  May 23, 1965, LOS: 19 ADMISSION DATE:  11/01/2019, CONSULTATION DATE:  9/29 REFERRING MD:  EDP, CHIEF COMPLAINT:  Cardiac arrest   Brief History   54 y/o male with HIV found down outside a convenience store on 9/27, received out of hospital CPR.  After ICU admission has had acute encephalopathy, concern for anoxic brain injury.  Received a tracheostomy 10/13.    Past Medical History  ETOH Abuse HIV - dx 1995  Significant Hospital Events   9/29 Admit post PEA arrest.  UDS positive for THC, benzo's.  ETOH 177 10/05 EEG ongoing. Versed restarted overnight due to agitation. Nicardipine increased.  10/06 Developed tachypnea with WUA, no follow commands off sedation  10/07 Vomiting, TF held / restarted with recurrent vomiting 10/16 tracheostomy collar for 9 hours  Consults:  Neurology  Procedures:  ETT 9/29 >>10/13 Tracheostomy>>10/3  Significant Diagnostic Tests:  CT head 9/29 >  No evidence of acute intracranial abnormality. Moderate generalized cerebral atrophy, advanced for age. Chronic medially displaced fracture deformity of the right lamina papyracea. Mild ethmoid and maxillary sinus mucosal thickening.  CT cervical spine 9/29 > No evidence of acute fracture to the cervical spine. Partially imaged airspace disease within the imaged lung apices (extensive on the left). Clinical correlation is recommended. 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  EEG 9/29 > Patient was noted to have frequent episodes of axial jerking with eye opening. Concomitant EEG showed generalized polyspikes consistent with myoclonic seizures. EEG also showed continuous generalized background suppression. EEG was not reactive to tactile stimulation. Hyperventilation and photic stimulation were not performed  ECHO 9/30 > No evidence of wall motion abnormality  MRI 10/3 > Mild DWI  hyperintensity involving the caudate and possibly putamen bilaterally. Additionally, possible subtle FLAIR hyperintensity involving the cerebellum, cortex diffusely, and bilateral basal ganglia. Although not diagnostic, these findings could be seen with early hypoxic/ischemic brain injury in this patient status post PEA arrest.   EEG 10/08> severe diffuse encephalopathy likely related to anoxic/hypoxic brain injury  Testicular US 10/10> showed large left hydrocele and left inguinal hernia. No evidence of testicular mass.  MRI brain 10/11: evolving hypoxic, anoxic brain injury compared to previous MRI   CT abdomen pelvis 10/12: Right inguinal hernia and partial small bowel obstruction, large left inguinal hernia  Abdominal x-ray: Small bowel obstruction  Micro Data:  BCx2 9/29 >> negative  Urine 9/29 >> UA few bacteria, WBC, RBC, CaOxalate, and Mucus Covid, Flu A/B 9/29 >> negative Tracheal aspirate 10/5 >> normal flora   Antimicrobials:  Vancomycin 9/29 >> 9/30 Cefepime 9/29 >> 9/30 Cefepime 10/5 >> 10/11  Interim history/subjective:   Looks comfortable   Objective   Blood pressure 113/69, pulse 87, temperature 99.1 F (37.3 C), temperature source Axillary, resp. rate (Abnormal) 24, height 5\' 6"  (1.676 m), weight 75.2 kg, SpO2 98 %.    Vent Mode: PRVC FiO2 (%):  [28 %-40 %] 35 % Set Rate:  [20 bmp] 20 bmp Vt Set:  [510 mL] 510 mL PEEP:  [5 cmH20] 5 cmH20 Plateau Pressure:  [14 cmH20-15 cmH20] 14 cmH20   Intake/Output Summary (Last 24 hours) at 11/20/2019 0956 Last data filed at 11/20/2019 0800 Gross per 24 hour  Intake 1806.95 ml  Output 445 ml  Net 1361.95 ml   Filed Weights   11/18/19 0400 11/19/19 0411 11/20/19 0411  Weight: 75.9 kg 75.4 kg 75.2 kg  Examination: General this is a 54 year old black male remains trach dependent.  HENT NCAT downward gaze pref. Trach w/ scant bloody secretions Neuro seems to respond to pain and noxious stim. 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 : neurology feels that "it is likely that patient will continue to have more neurologic recovery and possibly improvement in speech", though there has been little progression in his physical exam so uncertain if he will improve to a meaningful degree Plan Cont Keppra and supportive care Appreciate palliative care support   Trach/ vent dependence s/p cardiac arrest c/b Acute hypoxemic respiratory failure -looks comfortable on ATC this am  cxr looks good (no new infiltrates.) Plan Cont ATC as long as tolerated Keep vent on stand-bye for now  Routine trach care Hoping for local SNF off vent  SLP working on PMV  Cardiac arrest Hypertension Plan Cont Norvasc and Lisinopril   Bilateral inguinal hernia Left hydrocele Plan Monitor but no surgical intervention needed   Protein calorie malnutrition Plan Cont tubefeeds via PEG  EtOH abuse Plan Cont thiamine and folate   HIV/AIDS Plan Cont bactrim Tivicay and descovy   Best practice:  Diet: tube feeding Pain/Anxiety/Delirium protocol (if indicated): n/a VAP protocol (if indicated): yes DVT prophylaxis: sub q heparin GI prophylaxis: pantoprazole Glucose control: SSI Mobility: bed rest Code Status: full Family Communication: none bedside, called [place] Orozco for an update on 10/17 Disposition: Palliative care     Critical care time: n/a minutes    Simonne Martinet ACNP-BC [facility] Pulmonary/Critical Care Pager # [phone] OR # [phone] if no answer

## 2019-11-20 NOTE — Progress Notes (Signed)
  Speech Language Pathology Treatment: [place] Orozco Speaking valve  Patient Details Name: [place] Orozco MRN: [id number] DOB: September 05, 1965 Today's Date: 11/20/2019 Time: 3383-2919 SLP Time Calculation (min) (ACUTE ONLY): 28 min  Assessment / Plan / Recommendation Clinical Impression  Pt was seen for PMV session. When cuff was deflated, he demonstrated a dysphonic cough and thin blood-tinged secretions were expectorated tracheally. With inital placement of PMV after cuff deflation, he demonstrated difficulty redirecting air through the upper airway as evidenced by a change in vitals and a burst of air/release of pressure after PMV was removed. During subsequent trials, toleration improved and vitals remained stable. He was able to tolerate placement for about 1-3 minutes at a time. During trials, pt was unable to initiate voicing with PMV in place. Suspect brain injury also negatively impacting ability to produce voicing as well. SLP will continue to f/u acutely.    HPI HPI: 54 y/o male with hx of HIV, ETOH abuse found down outside a convenience store on 9/27, received out of hospital CPR. Presents with acute hypoxic respiratory failure s/p cardiac arrest, acute encephalopathy due to hypoxic brain injury. Intubated 9/27-10/13. Received a tracheostomy 10/13, treachestomy collar trial for 9 hours on 10/16. CXR on 10/17 improving and shows left lung opacities have essentially resolved.      SLP Plan  Continue with current plan of care  Patient needs continued Speech Lanaguage Pathology Services    Recommendations  Diet recommendations: NPO Medication Administration: Via alternative means                SLP Visit Diagnosis: Aphonia (R49.1);Cognitive communication deficit (R41.841) Plan: Continue with current plan of care       GO                Royetta Crochet 11/20/2019, 12:29 PM

## 2019-11-20 NOTE — Evaluation (Signed)
Speech Language Pathology Evaluation Patient Details Name: Ryan Orozco MRN: [id number] DOB: 23-Apr-1965 Today's Date: 11/20/2019 Time: 5400-8676 SLP Time Calculation (min) (ACUTE ONLY): 28 min  Problem List:  Patient Active Problem List   Diagnosis Date Noted  . Small bowel obstruction (HCC)   . Palliative care encounter   . Bowel obstruction (HCC)   . Pressure injury of skin 11/14/2019  . Vomiting   . Acute respiratory failure with hypoxia (HCC)   . Community acquired pneumonia of left upper lobe of lung   . Hypoxic encephalopathy (HCC)   . Alcohol abuse   . Cardiac arrest (HCC) 11/01/2019  . HIV (human immunodeficiency virus infection) (HCC) 02/18/2018  . Healthcare maintenance 02/18/2018  . Hypertension 02/18/2018   Past Medical History:  Past Medical History:  Diagnosis Date  . Alcohol abuse   . HIV (human immunodeficiency virus infection) (HCC) 1995   Past Surgical History:  Past Surgical History:  Procedure Laterality Date  . IR GASTROSTOMY TUBE MOD SED  11/17/2019  . NO PAST SURGERIES     HPI:  54 y/o male with hx of HIV, ETOH abuse found down outside a convenience store on 9/27, received out of hospital CPR. Presents with acute hypoxic respiratory failure s/p cardiac arrest, acute encephalopathy due to hypoxic brain injury. Intubated 9/27-10/13. Received a tracheostomy 10/13, treachestomy collar trial for 9 hours on 10/16. CXR on 10/17 improving and shows left lung opacities have essentially resolved.   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 His behaviors are consistent with Rancho level 2 (generalized response) and he demonstrates emerging level 3 behaviors (localized response). Note that pt has an anoxic brain injury rather than traumatic brain injury, but Rancho scale will still be utilized in this case to monitor progress. He demonstrated mostly generalized responses to  stimuli, but localized responses of the face and bilateral lower extremities were observed given tactile stimuli. Plan to see with PT for next session. Pt would benefit from ongoing interventions to provide opportunities for cognitive recovery.     SLP Assessment  SLP Recommendation/Assessment: Patient needs continued Speech Lanaguage Pathology Services SLP Visit Diagnosis: Aphonia (R49.1);Cognitive communication deficit (R41.841)    Follow Up Recommendations       Frequency and Duration min 2x/week  2 weeks      SLP Evaluation Cognition  Overall Cognitive Status: Impaired/Different from baseline Arousal/Alertness: Lethargic Orientation Level: Intubated/Tracheostomy - Unable to assess Attention: Focused Focused Attention: Impaired Focused Attention Impairment: Functional basic;Verbal basic Rancho Mirant Scales of Cognitive Functioning: Generalized response       Comprehension  Auditory Comprehension Overall Auditory Comprehension: Impaired Commands: Impaired One Step Basic Commands: 0-24% accurate    Expression Verbal Expression Overall Verbal Expression: Impaired Initiation: Impaired   Oral / Motor  Motor Speech Intelligibility: Unable to assess (comment)   GO                    Royetta Crochet 11/20/2019, 12:30 PM

## 2019-11-21 DIAGNOSIS — J9610 Chronic respiratory failure, unspecified whether with hypoxia or hypercapnia: Secondary | ICD-10-CM

## 2019-11-21 LAB — CBC WITH DIFFERENTIAL/PLATELET
Abs Immature Granulocytes: 0.06 10*3/uL (ref 0.00–0.07)
Basophils Absolute: 0 10*3/uL (ref 0.0–0.1)
Basophils Relative: 0 %
Eosinophils Absolute: 0.2 10*3/uL (ref 0.0–0.5)
Eosinophils Relative: 3 %
HCT: 33.9 % — ABNORMAL LOW (ref 39.0–52.0)
Hemoglobin: 10.9 g/dL — ABNORMAL LOW (ref 13.0–17.0)
Immature Granulocytes: 1 %
Lymphocytes Relative: 8 %
Lymphs Abs: 0.5 10*3/uL — ABNORMAL LOW (ref 0.7–4.0)
MCH: 30.4 pg (ref 26.0–34.0)
MCHC: 32.2 g/dL (ref 30.0–36.0)
MCV: 94.7 fL (ref 80.0–100.0)
Monocytes Absolute: 0.6 10*3/uL (ref 0.1–1.0)
Monocytes Relative: 11 %
Neutro Abs: 4.5 10*3/uL (ref 1.7–7.7)
Neutrophils Relative %: 77 %
Platelets: 187 10*3/uL (ref 150–400)
RBC: 3.58 MIL/uL — ABNORMAL LOW (ref 4.22–5.81)
RDW: 12.6 % (ref 11.5–15.5)
WBC: 5.8 10*3/uL (ref 4.0–10.5)
nRBC: 0 % (ref 0.0–0.2)

## 2019-11-21 LAB — BASIC METABOLIC PANEL
Anion gap: 8 (ref 5–15)
BUN: 17 mg/dL (ref 6–20)
CO2: 24 mmol/L (ref 22–32)
Calcium: 9.4 mg/dL (ref 8.9–10.3)
Chloride: 103 mmol/L (ref 98–111)
Creatinine, Ser: 0.64 mg/dL (ref 0.61–1.24)
GFR, Estimated: >60 mL/min (ref >60)
Glucose, Bld: 127 mg/dL — ABNORMAL HIGH (ref 70–99)
Potassium: 4.1 mmol/L (ref 3.5–5.1)
Sodium: 135 mmol/L (ref 135–145)

## 2019-11-21 LAB — GLUCOSE, CAPILLARY
Glucose-Capillary: 103 mg/dL — ABNORMAL HIGH (ref 70–99)
Glucose-Capillary: 103 mg/dL — ABNORMAL HIGH (ref 70–99)
Glucose-Capillary: 104 mg/dL — ABNORMAL HIGH (ref 70–99)
Glucose-Capillary: 108 mg/dL — ABNORMAL HIGH (ref 70–99)
Glucose-Capillary: 110 mg/dL — ABNORMAL HIGH (ref 70–99)
Glucose-Capillary: 111 mg/dL — ABNORMAL HIGH (ref 70–99)
Glucose-Capillary: 112 mg/dL — ABNORMAL HIGH (ref 70–99)

## 2019-11-21 MED ORDER — OSMOLITE 1.5 CAL PO LIQD
1000.0000 mL | ORAL | Status: DC
  Administered 2019-11-21 – 2020-01-06 (×38): 1000 mL
  Filled 2019-11-21 (×75): qty 1000

## 2019-11-21 MED ORDER — SCOPOLAMINE 1 MG/3DAYS TD PT72
1.0000 | MEDICATED_PATCH | TRANSDERMAL | Status: DC
  Administered 2019-11-21 – 2020-04-07 (×47): 1.5 mg via TRANSDERMAL
  Filled 2019-11-21 (×47): qty 1

## 2019-11-21 MED ORDER — GLYCOPYRROLATE 0.2 MG/ML IJ SOLN
0.1000 mg | Freq: Once | INTRAMUSCULAR | Status: AC
  Administered 2019-11-21: 0.1 mg via INTRAVENOUS
  Filled 2019-11-21: qty 1

## 2019-11-21 NOTE — Progress Notes (Signed)
Physical Therapy Treatment / Discharge Patient Details Name: Ryan Orozco MRN: [id number] DOB: 27-Jan-1966 Today's Date: 11/21/2019    History of Present Illness 54 year old male found down outside convenience store 9/27. Pt with PEA and hypoxic brain injury. Trach 10/13, PEG 10/15. PMHx: HIV AIDS and alcohol abuse    PT Comments    Pt with no purposeful response throughout session. With delayed blink to threat, no noxious response to bil UE, slow response bil LE to noxious stimuli, no significant response to cues, commands, ROM or positioning. PROM all extremities provided and pt not progressing with therapy at this time with no response throughout day for nursing either. At this time will sign off for acute therapy and defer PROM and positioning to nursing with pt requiring total care at SNF level. SpO2 98% on 40% FiO2 trach collar HR 92    Follow Up Recommendations  SNF;Supervision/Assistance - 24 hour     Equipment Recommendations  Hospital bed    Recommendations for Other Services       Precautions / Restrictions Precautions Precautions: Other (comment) Precaution Comments: trach, PEG, anoxic    Mobility  Bed Mobility               General bed mobility comments: pt just completed rolling with total +2 assist of nursing for bathing. Total assist of bed to position into chair and total assist to prop and position trunk in midline with pillows  Transfers                    Ambulation/Gait                 Stairs             Wheelchair Mobility    Modified Rankin (Stroke Patients Only)       Balance Overall balance assessment: Needs assistance   Sitting balance-Leahy Scale: Zero Sitting balance - Comments: right lean in sitting                                    Cognition Arousal/Alertness: Lethargic Behavior During Therapy: Flat affect Overall Cognitive Status: Impaired/Different from baseline                  Rancho Levels of Cognitive Functioning Rancho Mirant Scales of Cognitive Functioning: Generalized response               General Comments: no acknowlegement of his surrondings, no purposeful responses, opens eyes and withdrawals with bil LE not bil UE to pain      Exercises General Exercises - Upper Extremity Shoulder Flexion: PROM;Both;Seated;10 reps Shoulder ABduction: PROM;Both;Seated;10 reps Shoulder ADduction: PROM;Both;10 reps;Seated Shoulder Horizontal ABduction: PROM;Both;10 reps;Seated Elbow Flexion: PROM;Both;10 reps;Seated Elbow Extension: PROM;Both;Seated;10 reps General Exercises - Lower Extremity Ankle Circles/Pumps: PROM;Both;Seated;10 reps Long Arc Quad: PROM;Both;Seated;10 reps Hip ABduction/ADduction: PROM;Both;10 reps;Seated Hip Flexion/Marching: PROM;Both;Seated;10 reps    General Comments        Pertinent Vitals/Pain Faces Pain Scale: No hurt    Home Living                      Prior Function            PT Goals (current goals can now be found in the care plan section) Progress towards PT goals: Not progressing toward goals - comment    Frequency  PT Plan Current plan remains appropriate;Other (comment) (D/C to SNF appropriate, not appropriate for acute plan)    Co-evaluation              AM-PAC PT "6 Clicks" Mobility   Outcome Measure  Help needed turning from your back to your side while in a flat bed without using bedrails?: Total Help needed moving from lying on your back to sitting on the side of a flat bed without using bedrails?: Total Help needed moving to and from a bed to a chair (including a wheelchair)?: Total Help needed standing up from a chair using your arms (e.g., wheelchair or bedside chair)?: Total Help needed to walk in hospital room?: Total Help needed climbing 3-5 steps with a railing? : Total 6 Click Score: 6    End of Session Equipment Utilized During Treatment:  Oxygen Activity Tolerance: Patient tolerated treatment well Patient left: in bed Nurse Communication: Mobility status;Need for lift equipment       Time: 5397-6734 PT Time Calculation (min) (ACUTE ONLY): 16 min  Charges:  $Therapeutic Exercise: 8-22 mins                     Zanyla Klebba P, PT Acute Rehabilitation Services Pager: [phone] Office: [phone]    Nicola Quesnell B Kenna Kirn 11/21/2019, 11:31 AM

## 2019-11-21 NOTE — Progress Notes (Signed)
eLink Physician-Brief Progress Note Patient Name: Ryan Orozco DOB: [birth year] MRN: [id number]   Date of Service  11/21/2019  HPI/Events of Note  Bedside RN wants to know if pt can have Robinul to help with tons of secretions/ trache/vent, secretions can be pink tinged to tan color, went back on vent for the night  eICU Interventions  Low dose robinul ordered     Intervention Category Minor Interventions: Other:  Ranee Gosselin 11/21/2019, 12:06 AM

## 2019-11-21 NOTE — Progress Notes (Signed)
This chaplain responded to PMT consult for family support.  The chaplain phoned the Pt. daughter-Ryan Orozco after checking in with the Pt. RN-Felecia for an update. The chaplain updated Ryan Orozco with the communication from the RN and PA-Pete,  "watching Pt. for 24-48hr and then looking for place on the floor."  The chaplain encouraged Ryan Orozco to continue conversation with the RN.  The chaplain listened as Ryan Orozco articulated her role as the Pt.decision maker. The chaplain understands Ryan Orozco respects her father's story and remains hopeful while "deep down she is weighing all options." Ryan Orozco shared her husband is her source of support along with her aunt-[name], with whom she is staying with while in [place]. Ryan Orozco is planning to celebrate the Pt. birthday on Saturday. Guidance with the unit restrictions will be helpful. The chaplain shared the PMT phone number for F/U spiritual care and offered to meet Ryan Orozco bedside when she visits.

## 2019-11-21 NOTE — Progress Notes (Signed)
NAMEJerrid Orozco, MRN:  [id number], DOB:  1965/11/29, LOS: 20 ADMISSION DATE:  11/01/2019, CONSULTATION DATE:  9/29 REFERRING MD:  EDP, CHIEF COMPLAINT:  Cardiac arrest   Brief History   54 y/o male with HIV found down outside a convenience store on 9/27, received out of hospital CPR.  After ICU admission has had acute encephalopathy, concern for anoxic brain injury.  Received a tracheostomy 10/13.    Past Medical History  ETOH Abuse HIV - dx 1995  Significant Hospital Events   9/29 Admit post PEA arrest.  UDS positive for THC, benzo's.  ETOH 177 10/05 EEG ongoing. Versed restarted overnight due to agitation. Nicardipine increased.  10/06 Developed tachypnea with WUA, no follow commands off sedation  10/07 Vomiting, TF held / restarted with recurrent vomiting 10/16 tracheostomy collar for 9 hours 10/18->10/19 off vent all night  Consults:  Neurology  Procedures:  ETT 9/29 >>10/13 Tracheostomy>>10/3  Significant Diagnostic Tests:  CT head 9/29 >  No evidence of acute intracranial abnormality. Moderate generalized cerebral atrophy, advanced for age. Chronic medially displaced fracture deformity of the right lamina papyracea. Mild ethmoid and maxillary sinus mucosal thickening.  CT cervical spine 9/29 > No evidence of acute fracture to the cervical spine. Partially imaged airspace disease within the imaged lung apices (extensive on the left). Clinical correlation is recommended. 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  EEG 9/29 > Patient was noted to have frequent episodes of axial jerking with eye opening. Concomitant EEG showed generalized polyspikes consistent with myoclonic seizures. EEG also showed continuous generalized background suppression. EEG was not reactive to tactile stimulation. Hyperventilation and photic stimulation were not performed  ECHO 9/30 > No evidence of wall motion  abnormality  MRI 10/3 > Mild DWI hyperintensity involving the caudate and possibly putamen bilaterally. Additionally, possible subtle FLAIR hyperintensity involving the cerebellum, cortex diffusely, and bilateral basal ganglia. Although not diagnostic, these findings could be seen with early hypoxic/ischemic brain injury in this patient status post PEA arrest.   EEG 10/08> severe diffuse encephalopathy likely related to anoxic/hypoxic brain injury  Testicular US 10/10> showed large left hydrocele and left inguinal hernia. No evidence of testicular mass.  MRI brain 10/11: evolving hypoxic, anoxic brain injury compared to previous MRI   CT abdomen pelvis 10/12: Right inguinal hernia and partial small bowel obstruction, large left inguinal hernia  Abdominal x-ray: Small bowel obstruction  Micro Data:  BCx2 9/29 >> negative  Urine 9/29 >> UA few bacteria, WBC, RBC, CaOxalate, and Mucus Covid, Flu A/B 9/29 >> negative Tracheal aspirate 10/5 >> normal flora   Antimicrobials:  Vancomycin 9/29 >> 9/30 Cefepime 9/29 >> 9/30 Cefepime 10/5 >> 10/11  Interim history/subjective:  No issues  Objective   Blood pressure (Abnormal) 103/56, pulse 84, temperature 98.9 F (37.2 C), temperature source Axillary, resp. rate (Abnormal) 30, height 5\' 6"  (1.676 m), weight 75.3 kg, SpO2 99 %.    Vent Mode: PRVC FiO2 (%):  [35 %-40 %] 40 % Set Rate:  [20 bmp] 20 bmp Vt Set:  [510 mL] 510 mL PEEP:  [5 cmH20] 5 cmH20 Plateau Pressure:  [15 cmH20] 15 cmH20   Intake/Output Summary (Last 24 hours) at 11/21/2019 0853 Last data filed at 11/21/2019 0600 Gross per 24 hour  Intake 1372.44 ml  Output 1680 ml  Net -307.56 ml   Filed Weights   11/19/19 0411 11/20/19 0411 11/21/19 0412  Weight: 75.4 kg 75.2 kg  75.3 kg    Examination: General this is a 54 year old black male resting on ATC. Looks a little more interactive today  HENT NCAT trach w/ cuff down. Still w/ lots of tracheal secretions Pulm  scattered rhonchi. Looks comfortable on ATC Card RRR abd tol TF Ext dependent edema decreased muscle bulk Neuro opens eyes to voice but no purposeful response. 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    Trach/ vent dependence s/p cardiac arrest c/b Acute hypoxemic respiratory failure -looks comfortable on ATC this am  Stayed off vent all night  Plan Keep vent at bedside If stays off vent today can go to progressive in am  Cont routine trach care Add scopolamine patch (for secretions) SLP to cont PMV   Cardiac arrest Hypertension Plan Cont norvasc and Lisinopril    Bilateral inguinal hernia Left hydrocele Plan Monitor  Protein calorie malnutrition Plan TF via PEG  EtOH abuse Plan Thiamine and folate    HIV/AIDS Plan Bactrim, TIvicay and Descovy   Best practice:  Diet: tube feeding Pain/Anxiety/Delirium protocol (if indicated): n/a VAP protocol (if indicated): yes DVT prophylaxis: sub q heparin GI prophylaxis: pantoprazole Glucose control: SSI Mobility: bed rest Code Status: full Family Communication: none bedside, called [name] for an update on 10/17 Disposition: Palliative care     Critical care time: n/a minutes    Simonne Martinet ACNP-BC [facility] Pulmonary/Critical Care Pager # [phone] OR # [phone] if no answer

## 2019-11-21 NOTE — Care Management (Signed)
CM referral from PMT to assist pt's daughter with applying for Medicaid.  Message left for Cathlean Marseilles with [employer] to assist with Medicaid application.   Phone: [phone]  Quintella Baton, RN, BSN  Trauma/Neuro ICU Case Manager [phone]

## 2019-11-21 NOTE — Progress Notes (Addendum)
Nutrition Follow Up  DOCUMENTATION CODES:   Not applicable  INTERVENTION:   Transition to standard formula: -Osmolite 1.5 @ 60 ml/hr via PEG (1440 ml) -45 ml ProSource TID  Provides: 2280 kcals, 123 grams protein, 1097 ml free water.   NUTRITION DIAGNOSIS:   Increased nutrient needs related to acute illness as evidenced by estimated needs.  Ongoing  GOAL:   Patient will meet greater than or equal to 90% of their needs   Addressed via TF  MONITOR:   Vent status, Skin, TF tolerance, Weight trends, Labs, I & O's  REASON FOR ASSESSMENT:   Ventilator, Consult Enteral/tube feeding initiation and management  ASSESSMENT:   Patient with PMH significant for HIV and ETOH use. Presents this admission with cardiac arrest and suspected aspiration in setting of heavy ETOH use.   10/07 Vomiting, TF held / restarted with recurrent vomiting 10/12 R/L inguinal hernia, partial SBO  10/13 Trach 10/15 PEG  On ATC. Opens eyes to voice but no purposeful response. Does not follow commands. Tolerating Vital AF 1.2 @ goal rate. Transition to standard formula. Plan possible transfer to progressive if can remain off vent.   Admission weight: 78.9 kg  Current weight: 75.3 kg   UOP: 1680 ml x 24 hrs   Medications: miralax, thiamine Labs: CBG 103-120  Diet Order:   Diet Order            Diet NPO time specified  Diet effective midnight                 EDUCATION NEEDS:   Not appropriate for education at this time  Skin:  Skin Assessment: Skin Integrity Issues: Skin Integrity Issues:: Stage III Stage III: anus  Last BM:  10/19 via rectal tube  Height:   Ht Readings from Last 1 Encounters:  11/01/19 5\' 6"  (1.676 m)    Weight:   Wt Readings from Last 1 Encounters:  11/21/19 75.3 kg    BMI:  Body mass index is 26.79 kg/m.  Estimated Nutritional Needs:   Kcal:  2100-2300 kcal  Protein:  115-130 g  Fluid:  >/= 2 L/day   11/23/19 RD, LDN Clinical  Nutrition Pager listed in AMION

## 2019-11-22 LAB — GLUCOSE, CAPILLARY
Glucose-Capillary: 122 mg/dL — ABNORMAL HIGH (ref 70–99)
Glucose-Capillary: 135 mg/dL — ABNORMAL HIGH (ref 70–99)
Glucose-Capillary: 111 mg/dL — ABNORMAL HIGH (ref 70–99)
Glucose-Capillary: 106 mg/dL — ABNORMAL HIGH (ref 70–99)
Glucose-Capillary: 111 mg/dL — ABNORMAL HIGH (ref 70–99)
Glucose-Capillary: 122 mg/dL — ABNORMAL HIGH (ref 70–99)

## 2019-11-22 NOTE — Progress Notes (Signed)
NAMEStiles Orozco, MRN:  [id number], DOB:  January 01, 1966, LOS: 21 ADMISSION DATE:  11/01/2019, CONSULTATION DATE:  9/29 REFERRING MD:  EDP, CHIEF COMPLAINT:  Cardiac arrest   Brief History   54 y/o male with HIV found down outside a convenience store on 9/27, received out of hospital CPR.  After ICU admission has had acute encephalopathy, concern for anoxic brain injury.  Received a tracheostomy 10/13.    Past Medical History  ETOH Abuse HIV - dx 1995  Significant Hospital Events   9/29 Admit post PEA arrest.  UDS positive for THC, benzo's.  ETOH 177 10/05 EEG ongoing. Versed restarted overnight due to agitation. Nicardipine increased.  10/06 Developed tachypnea with WUA, no follow commands off sedation  10/07 Vomiting, TF held / restarted with recurrent vomiting 10/16 tracheostomy collar for 9 hours 10/18->10/19 off vent all night  Consults:  Neurology  Procedures:  ETT 9/29 >>10/13 Tracheostomy>>10/3  Significant Diagnostic Tests:  CT head 9/29 >  No evidence of acute intracranial abnormality. Moderate generalized cerebral atrophy, advanced for age. Chronic medially displaced fracture deformity of the right lamina papyracea. Mild ethmoid and maxillary sinus mucosal thickening.  CT cervical spine 9/29 > No evidence of acute fracture to the cervical spine. Partially imaged airspace disease within the imaged lung apices (extensive on the left). Clinical correlation is recommended. 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  EEG 9/29 > Patient was noted to have frequent episodes of axial jerking with eye opening. Concomitant EEG showed generalized polyspikes consistent with myoclonic seizures. EEG also showed continuous generalized background suppression. EEG was not reactive to tactile stimulation. Hyperventilation and photic stimulation were not performed  ECHO 9/30 > No evidence of wall motion  abnormality  MRI 10/3 > Mild DWI hyperintensity involving the caudate and possibly putamen bilaterally. Additionally, possible subtle FLAIR hyperintensity involving the cerebellum, cortex diffusely, and bilateral basal ganglia. Although not diagnostic, these findings could be seen with early hypoxic/ischemic brain injury in this patient status post PEA arrest.   EEG 10/08> severe diffuse encephalopathy likely related to anoxic/hypoxic brain injury  Testicular US 10/10> showed large left hydrocele and left inguinal hernia. No evidence of testicular mass.  MRI brain 10/11: evolving hypoxic, anoxic brain injury compared to previous MRI   CT abdomen pelvis 10/12: Right inguinal hernia and partial small bowel obstruction, large left inguinal hernia  Abdominal x-ray: Small bowel obstruction  Micro Data:  BCx2 9/29 >> negative  Urine 9/29 >> UA few bacteria, WBC, RBC, CaOxalate, and Mucus Covid, Flu A/B 9/29 >> negative Tracheal aspirate 10/5 >> normal flora   Antimicrobials:  Vancomycin 9/29 >> 9/30 Cefepime 9/29 >> 9/30 Cefepime 10/5 >> 10/11  Interim history/subjective:  Looks comfortable  Objective   Blood pressure 114/63, pulse 73, temperature 99.3 F (37.4 C), temperature source Axillary, resp. rate (Abnormal) 28, height 5\' 6"  (1.676 m), weight 75.3 kg, SpO2 100 %.    FiO2 (%):  [35 %-40 %] 35 %   Intake/Output Summary (Last 24 hours) at 11/22/2019 0846 Last data filed at 11/22/2019 0800 Gross per 24 hour  Intake 1382.98 ml  Output 650 ml  Net 732.98 ml   Filed Weights   11/19/19 0411 11/20/19 0411 11/21/19 0412  Weight: 75.4 kg 75.2 kg 75.3 kg    Examination: General this is a 54 year old black male. He is currently on 35% ATC. Appears comfortable. Not in distress HENT trach w/ decreased secretions.  NCAT MMM Pulm scattered rhonchi no accessory use Card RRR abd soft not tender PEG unremarkable  Ext bilateral LE contracted. Deconditioned. No sig edema warm brisk  CR GU cl yellow Neuro  Moving spontaneously, will open eyes to voice but not really interactive.     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 Secretions better after adding Scopolamine patch Plan Dc vent  Change trach to cuffless 6 Cont humidified oxygen and titrate for sats > 90% Cont routine trach color.  SLP to continue to work w/ PMV OK to move to progressive.   Cardiac arrest Hypertension Plan Norvasc and Lisinopril    Bilateral inguinal hernia Left hydrocele Plan Monitor   Protein calorie malnutrition Plan TF via PEG  EtOH abuse Plan Thiamine and folate    HIV/AIDS Plan Cont Bactrim, Tivicay and Descovy    Best practice:  Diet: tube feeding Pain/Anxiety/Delirium protocol (if indicated): n/a VAP protocol (if indicated): yes DVT prophylaxis: sub q heparin GI prophylaxis: pantoprazole Glucose control: SSI Mobility: bed rest Code Status: full Family Communication: none bedside Disposition: move to progressive care. Dc vent will ask triad to assume care. We will follow for trach management     Critical care time: n/a minutes    Simonne Martinet ACNP-BC [facility] Pulmonary/Critical Care Pager # [phone] OR # [phone] if no answer

## 2019-11-22 NOTE — Progress Notes (Signed)
°  Speech Language Pathology Treatment: Cognitive-Linquistic;Passy Muir Speaking valve  Patient Details Name: Ryan Orozco MRN: [id number] DOB: 03-01-1965 Today's Date: 11/22/2019 Time: 3154-0086 SLP Time Calculation (min) (ACUTE ONLY): 20 min  Assessment / Plan / Recommendation Clinical Impression  Pt was seen for cognitive-linguistic and PMV treatment. His behaviors continue to be consistent with Rancho level 2 and emerging level 3. He demonstrates mostly generalized responses, but localized responses were observed given tactile stimuli to his lips. He was unable to turn toward or away from auditory or visual stimuli. When given simple commands, he was largely unsuccessful, but questionably followed a command to close his eyes after a delayed response time.Pt tolerated placement of PMV well without any changes in vitals or appearance of respiratory distress. No attempt to phonate with PMV placed. He tracheally expectorated secretions before and after placement. SLP will continue to f/u acutely.   HPI HPI: 54 y/o male with hx of HIV, ETOH abuse found down outside a convenience store on 9/27, received out of hospital CPR. Presents with acute hypoxic respiratory failure s/p cardiac arrest, acute encephalopathy due to hypoxic brain injury. Intubated 9/27-10/13. Received a tracheostomy 10/13, treachestomy collar trial for 9 hours on 10/16. CXR on 10/17 improving and shows left lung opacities have essentially resolved.      SLP Plan  Continue with current plan of care       Recommendations         Patient may use Passy-Muir Speech Valve: with SLP only PMSV Supervision: Full         Oral Care Recommendations: Oral care QID SLP Visit Diagnosis: Aphonia (R49.1);Cognitive communication deficit (R41.841) Plan: Continue with current plan of care       GO                Royetta Crochet 11/22/2019, 1:07 PM

## 2019-11-22 NOTE — Plan of Care (Signed)
  Problem: Education: Goal: Knowledge of General Education information will improve Description: Including pain rating scale, medication(s)/side effects and non-pharmacologic comfort measures Outcome: Not Progressing   

## 2019-11-22 NOTE — Progress Notes (Signed)
PT arrived to the unit he's respondive to voice. Bath was given and CHG whip.  He had no belongings with him at bedside. Pt family support came up at 1600 and stayed for 2hrs.

## 2019-11-22 NOTE — Procedures (Signed)
Tracheostomy Change Note  Patient Details:   Name: Ryan Orozco DOB: 1965/02/06 MRN: [id number]    Airway Documentation:     Evaluation  O2 sats: stable throughout Complications: No apparent complications Patient did tolerate procedure well. Bilateral Breath Sounds: Rhonchi    Pts trach changed to #6cfs per MD order. No complications noted. ETCO2 detector positive for color change. Trach secure.  Guss Bunde 11/22/2019, 11:26 AM

## 2019-11-23 LAB — GLUCOSE, CAPILLARY
Glucose-Capillary: 102 mg/dL — ABNORMAL HIGH (ref 70–99)
Glucose-Capillary: 109 mg/dL — ABNORMAL HIGH (ref 70–99)
Glucose-Capillary: 119 mg/dL — ABNORMAL HIGH (ref 70–99)
Glucose-Capillary: 123 mg/dL — ABNORMAL HIGH (ref 70–99)
Glucose-Capillary: 95 mg/dL (ref 70–99)

## 2019-11-23 NOTE — Plan of Care (Signed)
  Problem: Clinical Measurements: Goal: Ability to maintain clinical measurements within normal limits will improve Outcome: Progressing Goal: Will remain free from infection Outcome: Progressing Goal: Diagnostic test results will improve Outcome: Progressing Goal: Respiratory complications will improve Outcome: Progressing Goal: Cardiovascular complication will be avoided Outcome: Progressing   Problem: Nutrition: Goal: Adequate nutrition will be maintained Outcome: Progressing   Problem: Coping: Goal: Level of anxiety will decrease Outcome: Progressing   Problem: Elimination: Goal: Will not experience complications related to bowel motility Outcome: Progressing Goal: Will not experience complications related to urinary retention Outcome: Progressing   Problem: Pain Managment: Goal: General experience of comfort will improve Outcome: Progressing   Problem: Safety: Goal: Ability to remain free from injury will improve Outcome: Progressing   Problem: Skin Integrity: Goal: Risk for impaired skin integrity will decrease Outcome: Progressing   Problem: Respiratory: Goal: Ability to maintain a clear airway and adequate ventilation will improve Outcome: Progressing   Problem: Role Relationship: Goal: Method of communication will improve Outcome: Not Progressing   Problem: Education: Goal: Knowledge of General Education information will improve Description: Including pain rating scale, medication(s)/side effects and non-pharmacologic comfort measures Outcome: Not Met (add Reason)   Problem: Health Behavior/Discharge Planning: Goal: Ability to manage health-related needs will improve Outcome: Not Met (add Reason)   Problem: Activity: Goal: Risk for activity intolerance will decrease Outcome: Not Met (add Reason)   Problem: Activity: Goal: Ability to tolerate increased activity will improve Outcome: Not Met (add Reason)

## 2019-11-23 NOTE — Progress Notes (Signed)
PROGRESS NOTE  Ryan Orozco  DOB: [birth year]  PCP: Default, Provider, MD [id number]  DOA: 11/01/2019  LOS: 22 days   Chief Complaint  Patient presents with  . Cardiac Arrest   Brief narrative: Ryan Orozco is a 54 y.o. male with PMH of HIV (dx 1995) and chronic alcohol abuse. Patient was brought to the ED by police on 3/41/9622 after events noted down outside a convenience store. EMS noted him pulseless with PEA.  Unknown downtime prior to EMS arrival.  EMS performed 2 rounds of CPR with 2 epi before ROSC, intubated en-route and brought to the ED.  In the ED, lactate was elevated to 4.4 post CPR, troponin IV 150, electrolytes within normal limit, blood alcohol level alcohol level 177.  UDS positive for THC, benzos. CT scan of head did not show any acute intracranial abnormality but showed moderate generalized cerebral atrophy advanced for age. He was admitted to ICU. Identified to have anoxic brain injury.  He failed extubation trials. 10/13, he received a tracheostomy.  He was gradually weaned off vent to trach collar. 10/15, IR inserted G-tube. 10/21, transferred out to hospitalist service.  Subjective: Patient was seen and examined this morning. Middle-aged African-American male.  Eyes wide-awake.  But not able to follow commands.  Not able to answer questions. Chart reviewed. No fever last 24 hours, heart rate in 70s mostly, tachypneic to 20s on tracheostomy collar at 5 L/min  Assessment/Plan: Cardiac arrest-prolonged downtime Anoxic brain injury Myoclonic seizures -Due to prolonged downtime after cardiac arrest. -ROSC achieved but patient might have had significant anoxic brain injury by then. -Neurology consultation was obtained. -EEG from 9/29 showed generalized polyspike's consistent with myoclonic seizures.  Patient was started on Keppra. -MRI from 10/3, 10/11 and repeat EEG from 10/8 all suggestive of severe diffuse encephalopathy likely related to  anoxic/hypoxic brain injury. -Per neurology note, "it is likely that patient will continue to have more neurologic recovery and possibly improvement in speech", though there has been little progression in his physical exam so uncertain if he will improve to a meaningful degree  Acute hypoxemic respiratory failure Tracheostomy and PEG dependence  -Again secondary to anoxic brain injury -Failed extubation trials.  Tracheostomy on 10/13.  Currently on 5 L oxygen by trach collar.  Wean down as tolerated. -Pulmonary following for tracheostomy care.  Currently has cuffless size 6 tube. -Target oxygen saturation more than 90%. -10/15, IR inserted PEG tube.  Continue tube feeding. -SLP to continue to work w/ PMV  Essential hypertension -Currently on Norvasc and lisinopril.  Blood pressure running in low normal range.  Will stop lisinopril.  Continue Norvasc.  Continue to monitor blood pressure.  Bilateral inguinal hernia Left hydrocele -Testicular US 10/10>showed large left hydrocele and left inguinal hernia. No evidence of testicular mass. -CT abdomen pelvis 10/12: Right inguinal hernia and partial small bowel obstruction, large left inguinal hernia -Patient has a Flexi-Seal.  Seems to have soft stool.  Okay to remove Flexi-Seal.  Protein calorie malnutrition -Nutrition consulted.  Note as below. -Continue tube feeding.  Chronic alcohol abuse abuse -Thiamine and folate.  HIV/AIDS -Cont Bactrim, Tivicay and Descovy  Mobility: Needs to work with PT once mental status improves Code Status:   Code Status: Full Code.  Palliative care consulted Nutritional status: Body mass index is 26.95 kg/m. Nutrition Problem: Increased nutrient needs Etiology: acute illness Signs/Symptoms: estimated needs Diet Order            Diet NPO time specified  Diet effective midnight  DVT prophylaxis: heparin injection 5,000 Units Start: 11/16/19 2200 SCDs Start: 11/01/19 2113    Antimicrobials:  HIV meds Fluid: None Consultants: Critical care, neurology Family Communication:  None at bedside  Status is: Inpatient  Remains inpatient appropriate because:Unsafe d/c plan   Dispo: The patient is from: Home              Anticipated d/c is to: SNF              Anticipated d/c date is: > 3 days              Patient currently is not medically stable to d/c.       Infusions:  . sodium chloride 10 mL/hr at 11/22/19 1710  . feeding supplement (OSMOLITE 1.5 CAL) 1,000 mL (11/22/19 1710)    Scheduled Meds: . amLODipine  10 mg Per Tube Daily  . chlorhexidine gluconate (MEDLINE KIT)  15 mL Mouth Rinse BID  . Chlorhexidine Gluconate Cloth  6 each Topical Q0600  . dolutegravir  50 mg Oral Daily  . emtricitabine-tenofovir AF  1 tablet Per Tube Daily  . feeding supplement (PROSource TF)  45 mL Per Tube TID  . folic acid  1 mg Per Tube Daily  . heparin injection (subcutaneous)  5,000 Units Subcutaneous Q8H  . levETIRAcetam  1,500 mg Per Tube BID  . mouth rinse  15 mL Mouth Rinse 10 times per day  . polyethylene glycol  17 g Per Tube Daily  . scopolamine  1 patch Transdermal Q72H  . sulfamethoxazole-trimethoprim  1 tablet Per Tube Once per day on Mon Wed Fri  . thiamine  100 mg Per Tube Daily    Antimicrobials: Anti-infectives (From admission, onward)   Start     Dose/Rate Route Frequency Ordered Stop   11/17/19 1548  ceFAZolin (ANCEF) 2-4 GM/100ML-% IVPB       Note to Pharmacy: Domenick Bookbinder   : cabinet override      11/17/19 1548 11/18/19 0359   11/16/19 1515  ceFAZolin (ANCEF) IVPB 2g/100 mL premix        2 g 200 mL/hr over 30 Minutes Intravenous To Radiology 11/16/19 1511 11/17/19 1625   11/15/19 0900  sulfamethoxazole-trimethoprim (BACTRIM DS) 800-160 MG per tablet 1 tablet        1 tablet Per Tube Once per day on Mon Wed Fri 11/13/19 1906     11/13/19 1615  dolutegravir (TIVICAY) tablet 50 mg        50 mg Oral Daily 11/13/19 1521     11/13/19 1615   emtricitabine-tenofovir AF (DESCOVY) 200-25 MG per tablet 1 tablet        1 tablet Per Tube Daily 11/13/19 1521     11/13/19 1530  sulfamethoxazole-trimethoprim (BACTRIM DS) 800-160 MG per tablet 1 tablet  Status:  Discontinued        1 tablet Oral Once per day on Mon Wed Fri 11/13/19 1441 11/13/19 1906   11/13/19 1500  bictegravir-emtricitabine-tenofovir AF (BIKTARVY) 50-200-25 MG per tablet 1 tablet  Status:  Discontinued        1 tablet Oral Daily 11/13/19 1403 11/13/19 1521   11/10/19 1000  erythromycin 250 mg in sodium chloride 0.9 % 100 mL IVPB        250 mg 100 mL/hr over 60 Minutes Intravenous Every 8 hours 11/10/19 0907 11/11/19 2000   11/07/19 1200  ceFEPIme (MAXIPIME) 2 g in sodium chloride 0.9 % 100 mL IVPB        2  g 200 mL/hr over 30 Minutes Intravenous Every 8 hours 11/07/19 1013 11/13/19 2127   11/03/19 1200  cefTRIAXone (ROCEPHIN) 2 g in sodium chloride 0.9 % 100 mL IVPB        2 g 200 mL/hr over 30 Minutes Intravenous Every 24 hours 11/03/19 0922 11/05/19 1427   11/02/19 0800  vancomycin (VANCOREADY) IVPB 750 mg/150 mL  Status:  Discontinued        750 mg 150 mL/hr over 60 Minutes Intravenous Every 12 hours 11/01/19 1935 11/02/19 1105   11/02/19 0600  piperacillin-tazobactam (ZOSYN) IVPB 3.375 g  Status:  Discontinued        3.375 g 12.5 mL/hr over 240 Minutes Intravenous Every 8 hours 11/02/19 0311 11/03/19 0920   11/01/19 2200  ceFEPIme (MAXIPIME) 2 g in sodium chloride 0.9 % 100 mL IVPB  Status:  Discontinued        2 g 200 mL/hr over 30 Minutes Intravenous Every 12 hours 11/01/19 2143 11/02/19 0311   11/01/19 1915  vancomycin (VANCOREADY) IVPB 1500 mg/300 mL        1,500 mg 150 mL/hr over 120 Minutes Intravenous  Once 11/01/19 1909 11/01/19 2147   11/01/19 1845  cefTRIAXone (ROCEPHIN) 1 g in sodium chloride 0.9 % 100 mL IVPB        1 g 200 mL/hr over 30 Minutes Intravenous  Once 11/01/19 1842 11/01/19 2051   11/01/19 1845  azithromycin (ZITHROMAX) 500 mg in sodium  chloride 0.9 % 250 mL IVPB        500 mg 250 mL/hr over 60 Minutes Intravenous  Once 11/01/19 1842 11/01/19 2051      PRN meds: acetaminophen (TYLENOL) oral liquid 160 mg/5 mL, docusate, influenza vac split quadrivalent PF   Objective: Vitals:   11/23/19 1157 11/23/19 1547  BP: 121/79 105/69  Pulse:  85  Resp: (!) 22 16  Temp: 98.7 F (37.1 C) 98.6 F (37 C)  SpO2:  99%    Intake/Output Summary (Last 24 hours) at 11/23/2019 1610 Last data filed at 11/23/2019 1517 Gross per 24 hour  Intake 1466.73 ml  Output 2200 ml  Net -733.27 ml   Filed Weights   11/21/19 0412 11/23/19 0004 11/23/19 0121  Weight: 75.3 kg 75.8 kg 75.8 kg   Weight change:  Body mass index is 26.95 kg/m.   Physical Exam: General exam: Appears calm and comfortable.  Not in physical distress Skin: No rashes, lesions or ulcers. HEENT: Atraumatic, normocephalic, supple neck, no obvious bleeding Lungs: Clear to auscultation bilaterally CVS: Regular rate and rhythm, no murmur GI/Abd soft, nontender, nondistended, bowel sound present, PEG tube site intact CNS: Eyes open.  Unable to follow commands, not oriented Psychiatry: Unable to examine because of poor mental status Extremities: No pedal edema, no calf tenderness  Data Review: I have personally reviewed the laboratory data and studies available.  Recent Labs  Lab 11/17/19 0108 11/18/19 0157 11/19/19 0407 11/20/19 0126 11/21/19 0622  WBC 6.2 6.9 7.5 6.6 5.8  NEUTROABS  --   --   --  5.2 4.5  HGB 11.7* 12.2* 12.8* 11.4* 10.9*  HCT 37.6* 38.3* 39.1 35.0* 33.9*  MCV 95.2 95.0 94.7 92.8 94.7  PLT 195 176 177 187 187   Recent Labs  Lab 11/17/19 0108 11/18/19 0157 11/19/19 0407 11/20/19 0126 11/21/19 0622  NA 138 137 134* 136 135  K 3.0* 3.9 3.6 3.6 4.1  CL 106 103 102 103 103  CO2 23 23 23 22 24   GLUCOSE 119*  98 118* 114* 127*  BUN 11 8 19 16 17   CREATININE 0.65 0.66 0.80 0.50* 0.64  CALCIUM 9.1 9.4 9.2 9.1 9.4    F/u labs  ordered  Signed, Terrilee Croak, MD [facility] 11/23/2019

## 2019-11-23 NOTE — Plan of Care (Signed)

## 2019-11-23 NOTE — Plan of Care (Signed)
  Problem: Education: Goal: Knowledge of General Education information will improve Description: Including pain rating scale, medication(s)/side effects and non-pharmacologic comfort measures Outcome: Not Progressing   

## 2019-11-23 NOTE — TOC Progression Note (Signed)
Transition of Care [facility]) - Progression Note    Patient Details  Name: Ryan Orozco MRN: [id number] Date of Birth: 26-Jun-1965  Transition of Care [facility]) CM/SW Contact  Lockie Pares, RN Phone Number: 11/23/2019, 2:09 PM  Clinical Narrative:    Speech worked with patient cannot follow commands, can work with Lia Hopping with Nursing. Working  With [employer] on disability and Personal assistant . Continues on trach collar oxygen. Has rubinol low dose for secretions. Continue to follow for discharge needs. Plan: to go to SNF once disability and Medicaid approved, will need LT medicare.    Expected Discharge Plan: Long Term Acute Care (LTAC) Barriers to Discharge: Continued Medical Work up  Expected Discharge Plan and Services Expected Discharge Plan: Long Term Acute Care (LTAC)   Discharge Planning Services: CM Consult   Living arrangements for the past 2 months: Single Family Home                                       Social Determinants of Health (SDOH) Interventions    Readmission Risk Interventions No flowsheet data found.

## 2019-11-24 LAB — CBC WITH DIFFERENTIAL/PLATELET
Abs Immature Granulocytes: 0.07 10*3/uL (ref 0.00–0.07)
Basophils Absolute: 0 10*3/uL (ref 0.0–0.1)
Basophils Relative: 1 %
Eosinophils Absolute: 0.2 10*3/uL (ref 0.0–0.5)
Eosinophils Relative: 4 %
HCT: 37.3 % — ABNORMAL LOW (ref 39.0–52.0)
Hemoglobin: 11.8 g/dL — ABNORMAL LOW (ref 13.0–17.0)
Immature Granulocytes: 2 %
Lymphocytes Relative: 15 %
Lymphs Abs: 0.7 10*3/uL (ref 0.7–4.0)
MCH: 30.2 pg (ref 26.0–34.0)
MCHC: 31.6 g/dL (ref 30.0–36.0)
MCV: 95.4 fL (ref 80.0–100.0)
Monocytes Absolute: 0.7 10*3/uL (ref 0.1–1.0)
Monocytes Relative: 14 %
Neutro Abs: 3 10*3/uL (ref 1.7–7.7)
Neutrophils Relative %: 64 %
Platelets: 251 10*3/uL (ref 150–400)
RBC: 3.91 MIL/uL — ABNORMAL LOW (ref 4.22–5.81)
RDW: 12.2 % (ref 11.5–15.5)
WBC: 4.6 10*3/uL (ref 4.0–10.5)
nRBC: 0 % (ref 0.0–0.2)

## 2019-11-24 LAB — BASIC METABOLIC PANEL
Anion gap: 11 (ref 5–15)
BUN: 16 mg/dL (ref 6–20)
CO2: 25 mmol/L (ref 22–32)
Calcium: 9.7 mg/dL (ref 8.9–10.3)
Chloride: 102 mmol/L (ref 98–111)
Creatinine, Ser: 0.59 mg/dL — ABNORMAL LOW (ref 0.61–1.24)
GFR, Estimated: >60 mL/min (ref >60)
Glucose, Bld: 121 mg/dL — ABNORMAL HIGH (ref 70–99)
Potassium: 4 mmol/L (ref 3.5–5.1)
Sodium: 138 mmol/L (ref 135–145)

## 2019-11-24 LAB — GLUCOSE, CAPILLARY
Glucose-Capillary: 127 mg/dL — ABNORMAL HIGH (ref 70–99)
Glucose-Capillary: 111 mg/dL — ABNORMAL HIGH (ref 70–99)
Glucose-Capillary: 93 mg/dL (ref 70–99)
Glucose-Capillary: 102 mg/dL — ABNORMAL HIGH (ref 70–99)
Glucose-Capillary: 131 mg/dL — ABNORMAL HIGH (ref 70–99)
Glucose-Capillary: 106 mg/dL — ABNORMAL HIGH (ref 70–99)

## 2019-11-24 LAB — PHOSPHORUS: Phosphorus: 3.5 mg/dL (ref 2.5–4.6)

## 2019-11-24 LAB — MAGNESIUM: Magnesium: 1.9 mg/dL (ref 1.7–2.4)

## 2019-11-24 NOTE — TOC Progression Note (Signed)
Transition of Care [facility]) - Progression Note    Patient Details  Name: Ryan Orozco MRN: [id number] Date of Birth: [birth year]  Transition [facility]) CM/SW Contact  Lockie Pares, RN Phone Number: 11/24/2019, 5:58 PM  Clinical Narrative:    Marthe Patch back from [employer]. Need letter from MD stating that he  Is incompetent to sign  Letters or decision make., if this is the case, to move forward with disability. This can be placed in the chart, where it can be securely to [employer] for their records.   Expected Discharge Plan: Long Term Acute Care (LTAC) Barriers to Discharge: Continued Medical Work up  Expected Discharge Plan and Services Expected Discharge Plan: Long Term Acute Care (LTAC)   Discharge Planning Services: CM Consult   Living arrangements for the past 2 months: Single Family Home                                       Social Determinants of Health (SDOH) Interventions    Readmission Risk Interventions No flowsheet data found.

## 2019-11-24 NOTE — Progress Notes (Signed)
Patient able expectorate secretions via trach. No noted respiratory distress, NAD. Able to rest and sleep and able to tolerate tracheal suctioning. Patient noted to get tachypneic after suctioning. Continue to monitor.

## 2019-11-24 NOTE — Progress Notes (Addendum)
  Speech Language Pathology Treatment: Cognitive-Linquistic;Passy Muir Speaking valve  Patient Details Name: Ryan Orozco MRN: [id number] DOB: 09-19-1965 Today's Date: 11/24/2019 Time: 1432-1500 SLP Time Calculation (min) (ACUTE ONLY): 28 min  Assessment / Plan / Recommendation Clinical Impression  Pt was seen for PMSV treatment. He was cooperative during the session with varying levels of alertness. Pt continues at a Ranchos level II/emerging II. Verbal and tactile stimulation was intermittently needed for pt to maintain an adequate level of alertnes. He inconsistently attended to auditory stimuli and looked in the SLP's direction when she called his name but did not follow commands during the session. Vitals were WNL at baseline and without significant change or respiratory distress following PMSV application. Pt tolerated PMSV placement for 15 minutes. He did not verbalize, but throat clearing was inconsistently noted and vocalization was demonstrated on four occasions. Pt expectorated secretions before and after PMSV placement but, despite cueing, did mobilize secretions to the oral cavity during placement. SLP will continue to follow pt.    HPI HPI: 54 y/o male with hx of HIV, ETOH abuse found down outside a convenience store on 9/27, received out of hospital CPR. Presents with acute hypoxic respiratory failure s/p cardiac arrest, acute encephalopathy due to hypoxic brain injury. Intubated 9/27-10/13. Received a tracheostomy 10/13, treachestomy collar trial for 9 hours on 10/16. CXR on 10/17 improving and shows left lung opacities have essentially resolved.      SLP Plan  Continue with current plan of care       Recommendations         Patient may use Passy-Muir Speech Valve: with SLP only PMSV Supervision: Full         Oral Care Recommendations: Oral care QID SLP Visit Diagnosis: Aphonia (R49.1);Cognitive communication deficit (R41.841) Plan: Continue with current plan of  care       Merrisa Skorupski I. Vear Clock, MS, CCC-SLP Acute Rehabilitation Services Office number [phone] Pager [phone]                 Scheryl Marten 11/24/2019, 3:47 PM

## 2019-11-24 NOTE — Progress Notes (Signed)
PROGRESS NOTE  Ryan Orozco  DOB: Dec 09, 1965  PCP: Default, Provider, MD KTG:[id number]  DOA: 11/01/2019  LOS: 23 days   Chief Complaint  Patient presents with  . Cardiac Arrest   Brief narrative: Ryan Orozco is a 54 y.o. male with PMH of HIV (dx 1995) and chronic alcohol abuse. Patient was brought to the ED by police on 05/31/7679 after events noted down outside a convenience store. EMS noted him pulseless with PEA.  Unknown downtime prior to EMS arrival.  EMS performed 2 rounds of CPR with 2 epi before ROSC, intubated en-route and brought to the ED.  In the ED, lactate was elevated to 4.4 post CPR, troponin IV 150, electrolytes within normal limit, blood alcohol level alcohol level 177.  UDS positive for THC, benzos. CT scan of head did not show any acute intracranial abnormality but showed moderate generalized cerebral atrophy advanced for age. He was admitted to ICU. Identified to have anoxic brain injury.  He failed extubation trials. 10/13, he received a tracheostomy.  He was gradually weaned off vent to trach collar. 10/15, IR inserted G-tube. 10/21, transferred out to hospitalist service.  Subjective: Patient was seen and examined this morning. Eyes open.  Unpurposeful movement of facial muscles. He tried to flicker his toes when asked him to move his legs.  Assessment/Plan: Cardiac arrest-prolonged downtime Anoxic brain injury Myoclonic seizures -Due to prolonged downtime after cardiac arrest. -ROSC achieved but patient might have had significant anoxic brain injury by then. -Neurology consultation was obtained. -EEG from 9/29 showed generalized polyspike's consistent with myoclonic seizures.  Patient was started on Keppra. -MRI from 10/3, 10/11 and repeat EEG from 10/8 all suggestive of severe diffuse encephalopathy likely related to anoxic/hypoxic brain injury. -Per neurology note, "it is likely that patient will continue to have more neurologic recovery  and possibly improvement in speech", though there has been little progression in his physical exam so uncertain if he will improve to a meaningful degree -On my examination today, he tried to flicker his toes when asked him to move his legs.  Acute hypoxemic respiratory failure Tracheostomy and PEG dependence  -Again secondary to anoxic brain injury -Failed extubation trials.  Tracheostomy on 10/13.  Currently on 5 L oxygen by trach collar.  Wean down as tolerated. -Pulmonary following for tracheostomy care.  Currently has cuffless size 6 tube. -Target oxygen saturation more than 90%. -10/15, IR inserted PEG tube.  Continue tube feeding. -SLP to continue to work w/ PMV  Essential hypertension -Currently on Norvasc. Continue to monitor blood pressure.  Bilateral inguinal hernia Left hydrocele -Testicular US 10/10>showed large left hydrocele and left inguinal hernia. No evidence of testicular mass. -CT abdomen pelvis 10/12: Right inguinal hernia and partial small bowel obstruction, large left inguinal hernia -Patient has a Flexi-Seal.  Seems to have soft stool.  Okay to remove Flexi-Seal.  Protein calorie malnutrition -Nutrition consulted.  Note as below. -Continue tube feeding.  Chronic alcohol abuse abuse -Thiamine and folate.  HIV/AIDS -Cont Bactrim, Tivicay and Descovy  Mobility: Needs to work with PT once mental status improves Code Status:   Code Status: Full Code.  Palliative care consulted Nutritional status: Body mass index is 26.95 kg/m. Nutrition Problem: Increased nutrient needs Etiology: acute illness Signs/Symptoms: estimated needs Diet Order            Diet NPO time specified  Diet effective midnight                 DVT prophylaxis: heparin injection 5,000  Units Start: 11/16/19 2200 SCDs Start: 11/01/19 2113   Antimicrobials:  HIV meds Fluid: None Consultants: Critical care, neurology Family Communication:  None at bedside  Status is:  Inpatient  Remains inpatient appropriate because:Unsafe d/c plan   Dispo: The patient is from: Home              Anticipated d/c is to: SNF              Anticipated d/c date is: > 3 days              Patient currently is not medically stable to d/c.       Infusions:  . sodium chloride 10 mL/hr at 11/22/19 1710  . feeding supplement (OSMOLITE 1.5 CAL) 60 mL/hr at 11/24/19 0500    Scheduled Meds: . amLODipine  10 mg Per Tube Daily  . chlorhexidine gluconate (MEDLINE KIT)  15 mL Mouth Rinse BID  . Chlorhexidine Gluconate Cloth  6 each Topical Q0600  . dolutegravir  50 mg Oral Daily  . emtricitabine-tenofovir AF  1 tablet Per Tube Daily  . feeding supplement (PROSource TF)  45 mL Per Tube TID  . folic acid  1 mg Per Tube Daily  . heparin injection (subcutaneous)  5,000 Units Subcutaneous Q8H  . levETIRAcetam  1,500 mg Per Tube BID  . mouth rinse  15 mL Mouth Rinse 10 times per day  . polyethylene glycol  17 g Per Tube Daily  . scopolamine  1 patch Transdermal Q72H  . sulfamethoxazole-trimethoprim  1 tablet Per Tube Once per day on Mon Wed Fri  . thiamine  100 mg Per Tube Daily    Antimicrobials: Anti-infectives (From admission, onward)   Start     Dose/Rate Route Frequency Ordered Stop   11/17/19 1548  ceFAZolin (ANCEF) 2-4 GM/100ML-% IVPB       Note to Pharmacy: Domenick Bookbinder   : cabinet override      11/17/19 1548 11/18/19 0359   11/16/19 1515  ceFAZolin (ANCEF) IVPB 2g/100 mL premix        2 g 200 mL/hr over 30 Minutes Intravenous To Radiology 11/16/19 1511 11/17/19 1625   11/15/19 0900  sulfamethoxazole-trimethoprim (BACTRIM DS) 800-160 MG per tablet 1 tablet        1 tablet Per Tube Once per day on Mon Wed Fri 11/13/19 1906     11/13/19 1615  dolutegravir (TIVICAY) tablet 50 mg        50 mg Oral Daily 11/13/19 1521     11/13/19 1615  emtricitabine-tenofovir AF (DESCOVY) 200-25 MG per tablet 1 tablet        1 tablet Per Tube Daily 11/13/19 1521     11/13/19 1530   sulfamethoxazole-trimethoprim (BACTRIM DS) 800-160 MG per tablet 1 tablet  Status:  Discontinued        1 tablet Oral Once per day on Mon Wed Fri 11/13/19 1441 11/13/19 1906   11/13/19 1500  bictegravir-emtricitabine-tenofovir AF (BIKTARVY) 50-200-25 MG per tablet 1 tablet  Status:  Discontinued        1 tablet Oral Daily 11/13/19 1403 11/13/19 1521   11/10/19 1000  erythromycin 250 mg in sodium chloride 0.9 % 100 mL IVPB        250 mg 100 mL/hr over 60 Minutes Intravenous Every 8 hours 11/10/19 0907 11/11/19 2000   11/07/19 1200  ceFEPIme (MAXIPIME) 2 g in sodium chloride 0.9 % 100 mL IVPB        2 g 200 mL/hr over  30 Minutes Intravenous Every 8 hours 11/07/19 1013 11/13/19 2127   11/03/19 1200  cefTRIAXone (ROCEPHIN) 2 g in sodium chloride 0.9 % 100 mL IVPB        2 g 200 mL/hr over 30 Minutes Intravenous Every 24 hours 11/03/19 0922 11/05/19 1427   11/02/19 0800  vancomycin (VANCOREADY) IVPB 750 mg/150 mL  Status:  Discontinued        750 mg 150 mL/hr over 60 Minutes Intravenous Every 12 hours 11/01/19 1935 11/02/19 1105   11/02/19 0600  piperacillin-tazobactam (ZOSYN) IVPB 3.375 g  Status:  Discontinued        3.375 g 12.5 mL/hr over 240 Minutes Intravenous Every 8 hours 11/02/19 0311 11/03/19 0920   11/01/19 2200  ceFEPIme (MAXIPIME) 2 g in sodium chloride 0.9 % 100 mL IVPB  Status:  Discontinued        2 g 200 mL/hr over 30 Minutes Intravenous Every 12 hours 11/01/19 2143 11/02/19 0311   11/01/19 1915  vancomycin (VANCOREADY) IVPB 1500 mg/300 mL        1,500 mg 150 mL/hr over 120 Minutes Intravenous  Once 11/01/19 1909 11/01/19 2147   11/01/19 1845  cefTRIAXone (ROCEPHIN) 1 g in sodium chloride 0.9 % 100 mL IVPB        1 g 200 mL/hr over 30 Minutes Intravenous  Once 11/01/19 1842 11/01/19 2051   11/01/19 1845  azithromycin (ZITHROMAX) 500 mg in sodium chloride 0.9 % 250 mL IVPB        500 mg 250 mL/hr over 60 Minutes Intravenous  Once 11/01/19 1842 11/01/19 2051      PRN  meds: acetaminophen (TYLENOL) oral liquid 160 mg/5 mL, docusate, influenza vac split quadrivalent PF   Objective: Vitals:   11/24/19 1618 11/24/19 1619  BP: 117/77 117/77  Pulse: 70 74  Resp: (!) 26 (!) 24  Temp: 98.6 F (37 C)   SpO2: 92% 94%    Intake/Output Summary (Last 24 hours) at 11/24/2019 1640 Last data filed at 11/24/2019 1621 Gross per 24 hour  Intake 1120 ml  Output 800 ml  Net 320 ml   Filed Weights   11/21/19 0412 11/23/19 0004 11/23/19 0121  Weight: 75.3 kg 75.8 kg 75.8 kg   Weight change:  Body mass index is 26.95 kg/m.   Physical Exam: General exam: Appears calm and comfortable.  Not in physical distress Skin: No rashes, lesions or ulcers. HEENT: Atraumatic, normocephalic, supple neck, no obvious bleeding Lungs: Clear to auscultation bilaterally CVS: Regular rate and rhythm, no murmur GI/Abd soft, nontender, nondistended, bowel sound present, PEG tube site intact CNS: Eyes open.  Inconsistent ability to follow some commands. Psychiatry: Unable to examine because of poor mental status Extremities: No pedal edema, no calf tenderness  Data Review: I have personally reviewed the laboratory data and studies available.  Recent Labs  Lab 11/18/19 0157 11/19/19 0407 11/20/19 0126 11/21/19 0622 11/24/19 0026  WBC 6.9 7.5 6.6 5.8 4.6  NEUTROABS  --   --  5.2 4.5 3.0  HGB 12.2* 12.8* 11.4* 10.9* 11.8*  HCT 38.3* 39.1 35.0* 33.9* 37.3*  MCV 95.0 94.7 92.8 94.7 95.4  PLT 176 177 187 187 251   Recent Labs  Lab 11/18/19 0157 11/19/19 0407 11/20/19 0126 11/21/19 0622 11/24/19 0026  NA 137 134* 136 135 138  K 3.9 3.6 3.6 4.1 4.0  CL 103 102 103 103 102  CO2 _0 GLUCOSE 98 118* 114* 127* 121*  BUN 8 19  _0 CREATININE 0.66 0.80 0.50* 0.64 0.59*  CALCIUM 9.4 9.2 9.1 9.4 9.7  MG  --   --   --   --  1.9  PHOS  --   --   --   --  3.5    F/u labs ordered  Signed, Terrilee Croak, MD [facility] 11/24/2019

## 2019-11-24 NOTE — Plan of Care (Signed)

## 2019-11-25 LAB — GLUCOSE, CAPILLARY
Glucose-Capillary: 117 mg/dL — ABNORMAL HIGH (ref 70–99)
Glucose-Capillary: 113 mg/dL — ABNORMAL HIGH (ref 70–99)
Glucose-Capillary: 107 mg/dL — ABNORMAL HIGH (ref 70–99)
Glucose-Capillary: 127 mg/dL — ABNORMAL HIGH (ref 70–99)
Glucose-Capillary: 115 mg/dL — ABNORMAL HIGH (ref 70–99)
Glucose-Capillary: 91 mg/dL (ref 70–99)

## 2019-11-25 NOTE — Progress Notes (Signed)
PROGRESS NOTE  Ryan Orozco  DOB: February 19, 1965  PCP: Default, Provider, MD ZOX:[id number]  DOA: 11/01/2019  LOS: 24 days   Chief Complaint  Patient presents with  . Cardiac Arrest   Brief narrative: Ryan Orozco is a 54 y.o. male with PMH of HIV (dx 1995) and chronic alcohol abuse. Patient was brought to the ED by police on 09/12/9145 after events noted down outside a convenience store. EMS noted him pulseless with PEA.  Unknown downtime prior to EMS arrival.  EMS performed 2 rounds of CPR with 2 epi before ROSC, intubated en-route and brought to the ED.  In the ED, lactate was elevated to 4.4 post CPR, troponin IV 150, electrolytes within normal limit, blood alcohol level alcohol level 177.  UDS positive for THC, benzos. CT scan of head did not show any acute intracranial abnormality but showed moderate generalized cerebral atrophy advanced for age. He was admitted to ICU. Identified to have anoxic brain injury.  He failed extubation trials. 10/13, he received a tracheostomy.  He was gradually weaned off vent to trach collar. 10/15, IR inserted G-tube. 10/21, transferred out to hospitalist service.  Subjective: Patient was seen and examined this morning. [birth year] today.  Eyes open.  Unpurposeful movement of facial muscles. He tried to flicker his toes when asked him to move his legs.  Assessment/Plan: Cardiac arrest-prolonged downtime Anoxic brain injury Myoclonic seizures -Due to prolonged downtime after cardiac arrest. -ROSC achieved but patient might have had significant anoxic brain injury by then. -Neurology consultation was obtained. -EEG from 9/29 showed generalized polyspike's consistent with myoclonic seizures.  Patient was started on Keppra. -MRI from 10/3, 10/11 and repeat EEG from 10/8 all suggestive of severe diffuse encephalopathy likely related to anoxic/hypoxic brain injury. -Per neurology note, "it is likely that patient will continue to  have more neurologic recovery and possibly improvement in speech", though there has been little progression in his physical exam so uncertain if he will improve to a meaningful degree -On my examination today, he was able to flex her toes of both feet.  Acute hypoxemic respiratory failure Tracheostomy and PEG dependence  -Again secondary to anoxic brain injury -Failed extubation trials.  Tracheostomy on 10/13.  Currently on 5 L oxygen by trach collar.  Wean down as tolerated. -Pulmonary following for tracheostomy care.  Currently has cuffless size 6 tube. -Target oxygen saturation more than 90%. -10/15, IR inserted PEG tube.  Continue tube feeding. -SLP to continue to work w/ PMV  Essential hypertension -Currently on Norvasc. Continue to monitor blood pressure.  Bilateral inguinal hernia Left hydrocele -Testicular US 10/10>showed large left hydrocele and left inguinal hernia. No evidence of testicular mass. -CT abdomen pelvis 10/12: Right inguinal hernia and partial small bowel obstruction, large left inguinal hernia -Patient has a Flexi-Seal. Seems to have soft stool. Okay to remove Flexi-Seal.  Protein calorie malnutrition -Nutrition consulted.  Note as below. -Continue tube feeding.  Chronic alcohol abuse abuse -Thiamine and folate.  HIV/AIDS -Cont Bactrim, Tivicay and Descovy  Mobility: Needs to work with PT once mental status improves Code Status:   Code Status: Full Code.  Palliative care consulted Nutritional status: Body mass index is 26.95 kg/m. Nutrition Problem: Increased nutrient needs Etiology: acute illness Signs/Symptoms: estimated needs Diet Order            Diet NPO time specified  Diet effective midnight                 DVT prophylaxis: heparin injection  5,000 Units Start: 11/16/19 2200 SCDs Start: 11/01/19 2113   Antimicrobials:  HIV meds Fluid: None Consultants: Critical care, neurology Family Communication:  None at bedside  Status is:  Inpatient  Remains inpatient appropriate because:Unsafe d/c plan   Dispo: The patient is from: Home              Anticipated d/c is to: SNF              Anticipated d/c date is: > 3 days              Patient currently is not medically stable to d/c.       Infusions:  . sodium chloride 10 mL/hr at 11/22/19 1710  . feeding supplement (OSMOLITE 1.5 CAL) 1,000 mL (11/24/19 2214)    Scheduled Meds: . amLODipine  10 mg Per Tube Daily  . chlorhexidine gluconate (MEDLINE KIT)  15 mL Mouth Rinse BID  . Chlorhexidine Gluconate Cloth  6 each Topical Q0600  . dolutegravir  50 mg Oral Daily  . emtricitabine-tenofovir AF  1 tablet Per Tube Daily  . feeding supplement (PROSource TF)  45 mL Per Tube TID  . folic acid  1 mg Per Tube Daily  . heparin injection (subcutaneous)  5,000 Units Subcutaneous Q8H  . levETIRAcetam  1,500 mg Per Tube BID  . mouth rinse  15 mL Mouth Rinse 10 times per day  . polyethylene glycol  17 g Per Tube Daily  . scopolamine  1 patch Transdermal Q72H  . sulfamethoxazole-trimethoprim  1 tablet Per Tube Once per day on Mon Wed Fri  . thiamine  100 mg Per Tube Daily    Antimicrobials: Anti-infectives (From admission, onward)   Start     Dose/Rate Route Frequency Ordered Stop   11/17/19 1548  ceFAZolin (ANCEF) 2-4 GM/100ML-% IVPB       Note to Pharmacy: Domenick Bookbinder   : cabinet override      11/17/19 1548 11/18/19 0359   11/16/19 1515  ceFAZolin (ANCEF) IVPB 2g/100 mL premix        2 g 200 mL/hr over 30 Minutes Intravenous To Radiology 11/16/19 1511 11/17/19 1625   11/15/19 0900  sulfamethoxazole-trimethoprim (BACTRIM DS) 800-160 MG per tablet 1 tablet        1 tablet Per Tube Once per day on Mon Wed Fri 11/13/19 1906     11/13/19 1615  dolutegravir (TIVICAY) tablet 50 mg        50 mg Oral Daily 11/13/19 1521     11/13/19 1615  emtricitabine-tenofovir AF (DESCOVY) 200-25 MG per tablet 1 tablet        1 tablet Per Tube Daily 11/13/19 1521     11/13/19 1530   sulfamethoxazole-trimethoprim (BACTRIM DS) 800-160 MG per tablet 1 tablet  Status:  Discontinued        1 tablet Oral Once per day on Mon Wed Fri 11/13/19 1441 11/13/19 1906   11/13/19 1500  bictegravir-emtricitabine-tenofovir AF (BIKTARVY) 50-200-25 MG per tablet 1 tablet  Status:  Discontinued        1 tablet Oral Daily 11/13/19 1403 11/13/19 1521   11/10/19 1000  erythromycin 250 mg in sodium chloride 0.9 % 100 mL IVPB        250 mg 100 mL/hr over 60 Minutes Intravenous Every 8 hours 11/10/19 0907 11/11/19 2000   11/07/19 1200  ceFEPIme (MAXIPIME) 2 g in sodium chloride 0.9 % 100 mL IVPB        2 g 200 mL/hr over  30 Minutes Intravenous Every 8 hours 11/07/19 1013 11/13/19 2127   11/03/19 1200  cefTRIAXone (ROCEPHIN) 2 g in sodium chloride 0.9 % 100 mL IVPB        2 g 200 mL/hr over 30 Minutes Intravenous Every 24 hours 11/03/19 0922 11/05/19 1427   11/02/19 0800  vancomycin (VANCOREADY) IVPB 750 mg/150 mL  Status:  Discontinued        750 mg 150 mL/hr over 60 Minutes Intravenous Every 12 hours 11/01/19 1935 11/02/19 1105   11/02/19 0600  piperacillin-tazobactam (ZOSYN) IVPB 3.375 g  Status:  Discontinued        3.375 g 12.5 mL/hr over 240 Minutes Intravenous Every 8 hours 11/02/19 0311 11/03/19 0920   11/01/19 2200  ceFEPIme (MAXIPIME) 2 g in sodium chloride 0.9 % 100 mL IVPB  Status:  Discontinued        2 g 200 mL/hr over 30 Minutes Intravenous Every 12 hours 11/01/19 2143 11/02/19 0311   11/01/19 1915  vancomycin (VANCOREADY) IVPB 1500 mg/300 mL        1,500 mg 150 mL/hr over 120 Minutes Intravenous  Once 11/01/19 1909 11/01/19 2147   11/01/19 1845  cefTRIAXone (ROCEPHIN) 1 g in sodium chloride 0.9 % 100 mL IVPB        1 g 200 mL/hr over 30 Minutes Intravenous  Once 11/01/19 1842 11/01/19 2051   11/01/19 1845  azithromycin (ZITHROMAX) 500 mg in sodium chloride 0.9 % 250 mL IVPB        500 mg 250 mL/hr over 60 Minutes Intravenous  Once 11/01/19 1842 11/01/19 2051      PRN  meds: acetaminophen (TYLENOL) oral liquid 160 mg/5 mL, docusate, influenza vac split quadrivalent PF   Objective: Vitals:   11/25/19 0900 11/25/19 1149  BP:  110/77  Pulse:  80  Resp: 20 (!) 24  Temp:  99.5 F (37.5 C)  SpO2:  99%    Intake/Output Summary (Last 24 hours) at 11/25/2019 1438 Last data filed at 11/25/2019 0600 Gross per 24 hour  Intake 1120 ml  Output 900 ml  Net 220 ml   Filed Weights   11/21/19 0412 11/23/19 0004 11/23/19 0121  Weight: 75.3 kg 75.8 kg 75.8 kg   Weight change:  Body mass index is 26.95 kg/m.   Physical Exam: General exam: Appears calm and comfortable.  Not in physical distress Skin: No rashes, lesions or ulcers. HEENT: Atraumatic, normocephalic, supple neck, no obvious bleeding Lungs: Clear to auscultation bilaterally CVS: Regular rate and rhythm, no murmur GI/Abd soft, nontender, nondistended, bowel sound present, PEG tube site intact CNS: Eyes open.  Inconsistent ability to follow some commands. Psychiatry: Unable to examine because of poor mental status Extremities: No pedal edema, no calf tenderness  Data Review: I have personally reviewed the laboratory data and studies available.  Recent Labs  Lab 11/19/19 0407 11/20/19 0126 11/21/19 0622 11/24/19 0026  WBC 7.5 6.6 5.8 4.6  NEUTROABS  --  5.2 4.5 3.0  HGB 12.8* 11.4* 10.9* 11.8*  HCT 39.1 35.0* 33.9* 37.3*  MCV 94.7 92.8 94.7 95.4  PLT 177 187 187 251   Recent Labs  Lab 11/19/19 0407 11/20/19 0126 11/21/19 0622 11/24/19 0026  NA 134* 136 135 138  K 3.6 3.6 4.1 4.0  CL 102 103 103 102  CO2 23 22 24 25   GLUCOSE 118* 114* 127* 121*  BUN 19 16 17 16   CREATININE 0.80 0.50* 0.64 0.59*  CALCIUM 9.2 9.1 9.4 9.7  MG  --   --   --  1.9  PHOS  --   --   --  3.5    F/u labs ordered  Signed, Terrilee Croak, MD [facility] 11/25/2019

## 2019-11-25 NOTE — Progress Notes (Signed)
Daily Progress Note   Patient Name: Ryan Orozco       Date: 11/25/2019 DOB: 1965/05/24  Age: 54 y.o. MRN#: [id number] Attending Physician: Ryan Glass, MD Primary Care Physician: Default, Provider, MD Admit Date: 11/01/2019  Reason for Consultation/Follow-up:  To discuss complex medical decision making related to patient's goals of care  Subjective: Patient is nonverbal.  He opens his eyes to voice but does not track me in the room. Later his daughter arrives.  He does track her and smiles at her voice as she sings "Happy [place] Ouch" to him.  Ryan Orozco asks to speak with TOC.  She is trying to make arrangements for him before returning to [facility] tomorrow.  She wishes to pursue guardianship in the future.   Assessment: Patient is unable to follow commands for me but does smile at his daughter.  He is having some difficulty with secretions.  He has frequent coughing episodes.  Stable on 5L trach collar.   Patient Profile/HPI:  54 y.o. male  with past medical history of HIV, ETOH abuse, bilateral inguinal hernia, who was admitted on 11/01/2019 after PEA cardiac arrest.   Imaging raises concern for anoxic brain injury.  He received a tracheostomy on 10/13 and is having a PEG placed by IR today.    Length of Stay: 24   Vital Signs: BP 110/77   Pulse 80   Temp 99.5 F (37.5 C) (Axillary)   Resp (!) 24   Ht 5\' 6"  (1.676 m)   Wt 75.8 kg   SpO2 99%   BMI 26.95 kg/m  SpO2: SpO2: 99 % O2 Device: O2 Device: Tracheostomy Collar O2 Flow Rate: O2 Flow Rate (L/min): 5 L/min       Palliative Assessment/Data: 10%     Palliative Care Plan    Recommendations/Plan:  Move forward with placement.  Strongly recommend palliative care at next venue.  Daughter and sister need support  Code  Status:  Full code  Prognosis:  Likely months with full scope support.  Discharge Planning:  Vent SNF placement  Care plan was discussed with   Thank you for allowing the Palliative Medicine Team to assist in the care of this patient.  Total time spent:  25 min.     Greater than 50%  of this time was spent counseling and coordinating care  related to the above assessment and plan.  Ryan Richards, PA-C Palliative Medicine  Please contact Palliative MedicineTeam phone at [phone] for questions and concerns between 7 am - 7 pm.   Please see AMION for individual Orozco pager numbers.

## 2019-11-25 NOTE — Plan of Care (Signed)

## 2019-11-25 NOTE — TOC Progression Note (Signed)
Transition of Care [facility]) - Progression Note    Patient Details  Name: Emett Stapel MRN: [id number] Date of Birth: 1965/09/01  [facility]) CM/SW Contact  Annalee Genta, LCSW Phone Number: 11/25/2019, 3:12 PM  Clinical Narrative: EDCSW received call from RN. EDCSW discussed with primary CSW. EDCSW contacted patient's daughter and noted daughter had questions about disability/Medicaid. EDCSW reviewed chart and informed daughter the application process was started and are awaiting a signed letter from the doctor. EDCSW noted daughter reported patient has a court date coming up on 12/04/2019 and his public defender is requesting a doctor signed letter. EDCSW additionally provided information to daughter on the process for petitioning for guardianship per her request. EDCSW discussed with attending physician who is on service tomorrow and will follow-up with attending tomorrow for documentation.      Expected Discharge Plan: Long Term Acute Care (LTAC) Barriers to Discharge: Continued Medical Work up  Expected Discharge Plan and Services Expected Discharge Plan: Long Term Acute Care (LTAC)   Discharge Planning Services: CM Consult   Living arrangements for the past 2 months: Single Family Home                                       Social Determinants of Health (SDOH) Interventions    Readmission Risk Interventions No flowsheet data found.

## 2019-11-25 NOTE — Plan of Care (Signed)
Patient remains on 5L T collar. 02 Sats 97%. Patient remains free from falls. Safety precautions maintained.

## 2019-11-26 LAB — BASIC METABOLIC PANEL
Anion gap: 11 (ref 5–15)
BUN: 13 mg/dL (ref 6–20)
CO2: 25 mmol/L (ref 22–32)
Calcium: 10 mg/dL (ref 8.9–10.3)
Chloride: 100 mmol/L (ref 98–111)
Creatinine, Ser: 0.75 mg/dL (ref 0.61–1.24)
GFR, Estimated: >60 mL/min (ref >60)
Glucose, Bld: 128 mg/dL — ABNORMAL HIGH (ref 70–99)
Potassium: 4.1 mmol/L (ref 3.5–5.1)
Sodium: 136 mmol/L (ref 135–145)

## 2019-11-26 LAB — CBC WITH DIFFERENTIAL/PLATELET
Abs Immature Granulocytes: 0.06 10*3/uL (ref 0.00–0.07)
Basophils Absolute: 0 10*3/uL (ref 0.0–0.1)
Basophils Relative: 0 %
Eosinophils Absolute: 0.2 10*3/uL (ref 0.0–0.5)
Eosinophils Relative: 3 %
HCT: 38.5 % — ABNORMAL LOW (ref 39.0–52.0)
Hemoglobin: 12.4 g/dL — ABNORMAL LOW (ref 13.0–17.0)
Immature Granulocytes: 1 %
Lymphocytes Relative: 12 %
Lymphs Abs: 0.8 10*3/uL (ref 0.7–4.0)
MCH: 30.5 pg (ref 26.0–34.0)
MCHC: 32.2 g/dL (ref 30.0–36.0)
MCV: 94.6 fL (ref 80.0–100.0)
Monocytes Absolute: 0.7 10*3/uL (ref 0.1–1.0)
Monocytes Relative: 11 %
Neutro Abs: 4.6 10*3/uL (ref 1.7–7.7)
Neutrophils Relative %: 73 %
Platelets: 239 10*3/uL (ref 150–400)
RBC: 4.07 MIL/uL — ABNORMAL LOW (ref 4.22–5.81)
RDW: 12 % (ref 11.5–15.5)
WBC: 6.3 10*3/uL (ref 4.0–10.5)
nRBC: 0 % (ref 0.0–0.2)

## 2019-11-26 LAB — GLUCOSE, CAPILLARY
Glucose-Capillary: 108 mg/dL — ABNORMAL HIGH (ref 70–99)
Glucose-Capillary: 127 mg/dL — ABNORMAL HIGH (ref 70–99)
Glucose-Capillary: 129 mg/dL — ABNORMAL HIGH (ref 70–99)
Glucose-Capillary: 108 mg/dL — ABNORMAL HIGH (ref 70–99)
Glucose-Capillary: 97 mg/dL (ref 70–99)
Glucose-Capillary: 91 mg/dL (ref 70–99)

## 2019-11-26 NOTE — Progress Notes (Signed)
PROGRESS NOTE  Ryan Orozco  DOB: 17-Mar-1965  PCP: Default, Provider, MD CBJ:[id number]  DOA: 11/01/2019  LOS: 25 days   Chief Complaint  Patient presents with  . Cardiac Arrest   Brief narrative: Ryan Orozco is a 54 y.o. male with PMH of HIV (dx 1995) and chronic alcohol abuse. Patient was brought to the ED by police on 1/60/7371 after events noted down outside a convenience store. EMS noted him pulseless with PEA.  Unknown downtime prior to EMS arrival.  EMS performed 2 rounds of CPR with 2 epi before ROSC, intubated en-route and brought to the ED.  In the ED, lactate was elevated to 4.4 post CPR, troponin IV 150, electrolytes within normal limit, blood alcohol level alcohol level 177.  UDS positive for THC, benzos. CT scan of head did not show any acute intracranial abnormality but showed moderate generalized cerebral atrophy advanced for age. He was admitted to ICU. Identified to have anoxic brain injury.  He failed extubation trials. 10/13, he received a tracheostomy.  He was gradually weaned off vent to trach collar. 10/15, IR inserted G-tube. 10/21, transferred out to hospitalist service.  Subjective: Patient was seen and examined this morning. No essential change in last 24 hours.  Tries to move his lower extremities when asked.  Assessment/Plan: Cardiac arrest-prolonged downtime Anoxic brain injury Myoclonic seizures -Due to prolonged downtime after cardiac arrest. -ROSC achieved but patient might have had significant anoxic brain injury by then. -Neurology consultation was obtained. -EEG from 9/29 showed generalized polyspike's consistent with myoclonic seizures.  Patient was started on Keppra. -MRI from 10/3, 10/11 and repeat EEG from 10/8 all suggestive of severe diffuse encephalopathy likely related to anoxic/hypoxic brain injury. -Per neurology note, "it is likely that patient will continue to have more neurologic recovery and possibly improvement in  speech", though there has been little progression in his physical exam so uncertain if he will improve to a meaningful degree -On serial examinations, patient has been noted to move his toes on command.  But otherwise no meaningful change in mental status.  Acute hypoxemic respiratory failure Tracheostomy and PEG dependence  -Again secondary to anoxic brain injury -Failed extubation trials.  Tracheostomy on 10/13.  Currently on 5 L oxygen by trach collar.  Wean down as tolerated. -Pulmonary following for tracheostomy care.  Currently has cuffless size 6 tube. -Target oxygen saturation more than 90%. -10/15, IR inserted PEG tube.  Continue tube feeding. -SLP to continue to work w/ PMV  Essential hypertension -Currently on Norvasc. Continue to monitor blood pressure.  Bilateral inguinal hernia Left hydrocele -Testicular US 10/10>showed large left hydrocele and left inguinal hernia. No evidence of testicular mass. -CT abdomen pelvis 10/12: Right inguinal hernia and partial small bowel obstruction, large left inguinal hernia -Patient has a Flexi-Seal. Seems to have soft stool. Okay to remove Flexi-Seal.  Protein calorie malnutrition -Nutrition consulted.  Note as below. -Continue tube feeding.  Chronic alcohol abuse abuse -Thiamine and folate.  HIV/AIDS -Cont Bactrim, Tivicay and Descovy  Mobility: Needs to work with PT once mental status improves Code Status:   Code Status: Full Code.  Palliative care consulted Nutritional status: Body mass index is 26.95 kg/m. Nutrition Problem: Increased nutrient needs Etiology: acute illness Signs/Symptoms: estimated needs Diet Order            Diet NPO time specified  Diet effective midnight                 DVT prophylaxis: heparin injection 5,000 Units Start:  11/16/19 2200 SCDs Start: 11/01/19 2113   Antimicrobials:  HIV meds Fluid: None Consultants: Critical care, neurology Family Communication:  Discussed with patient  daughter at bedside on 10/23.  Status is: Inpatient  Remains inpatient appropriate because:Unsafe d/c plan   Dispo: The patient is from: Home              Anticipated d/c is to: SNF              Anticipated d/c date is: > 3 days              Patient currently is not medically stable to d/c.       Infusions:  . sodium chloride 10 mL/hr at 11/26/19 0611  . feeding supplement (OSMOLITE 1.5 CAL) 60 mL/hr at 11/25/19 1900    Scheduled Meds: . amLODipine  10 mg Per Tube Daily  . chlorhexidine gluconate (MEDLINE KIT)  15 mL Mouth Rinse BID  . Chlorhexidine Gluconate Cloth  6 each Topical Q0600  . dolutegravir  50 mg Oral Daily  . emtricitabine-tenofovir AF  1 tablet Per Tube Daily  . feeding supplement (PROSource TF)  45 mL Per Tube TID  . folic acid  1 mg Per Tube Daily  . heparin injection (subcutaneous)  5,000 Units Subcutaneous Q8H  . levETIRAcetam  1,500 mg Per Tube BID  . mouth rinse  15 mL Mouth Rinse 10 times per day  . polyethylene glycol  17 g Per Tube Daily  . scopolamine  1 patch Transdermal Q72H  . sulfamethoxazole-trimethoprim  1 tablet Per Tube Once per day on Mon Wed Fri  . thiamine  100 mg Per Tube Daily    Antimicrobials: Anti-infectives (From admission, onward)   Start     Dose/Rate Route Frequency Ordered Stop   11/17/19 1548  ceFAZolin (ANCEF) 2-4 GM/100ML-% IVPB       Note to Pharmacy: Domenick Bookbinder   : cabinet override      11/17/19 1548 11/18/19 0359   11/16/19 1515  ceFAZolin (ANCEF) IVPB 2g/100 mL premix        2 g 200 mL/hr over 30 Minutes Intravenous To Radiology 11/16/19 1511 11/17/19 1625   11/15/19 0900  sulfamethoxazole-trimethoprim (BACTRIM DS) 800-160 MG per tablet 1 tablet        1 tablet Per Tube Once per day on Mon Wed Fri 11/13/19 1906     11/13/19 1615  dolutegravir (TIVICAY) tablet 50 mg        50 mg Oral Daily 11/13/19 1521     11/13/19 1615  emtricitabine-tenofovir AF (DESCOVY) 200-25 MG per tablet 1 tablet        1 tablet Per  Tube Daily 11/13/19 1521     11/13/19 1530  sulfamethoxazole-trimethoprim (BACTRIM DS) 800-160 MG per tablet 1 tablet  Status:  Discontinued        1 tablet Oral Once per day on Mon Wed Fri 11/13/19 1441 11/13/19 1906   11/13/19 1500  bictegravir-emtricitabine-tenofovir AF (BIKTARVY) 50-200-25 MG per tablet 1 tablet  Status:  Discontinued        1 tablet Oral Daily 11/13/19 1403 11/13/19 1521   11/10/19 1000  erythromycin 250 mg in sodium chloride 0.9 % 100 mL IVPB        250 mg 100 mL/hr over 60 Minutes Intravenous Every 8 hours 11/10/19 0907 11/11/19 2000   11/07/19 1200  ceFEPIme (MAXIPIME) 2 g in sodium chloride 0.9 % 100 mL IVPB        2 g  200 mL/hr over 30 Minutes Intravenous Every 8 hours 11/07/19 1013 11/13/19 2127   11/03/19 1200  cefTRIAXone (ROCEPHIN) 2 g in sodium chloride 0.9 % 100 mL IVPB        2 g 200 mL/hr over 30 Minutes Intravenous Every 24 hours 11/03/19 0922 11/05/19 1427   11/02/19 0800  vancomycin (VANCOREADY) IVPB 750 mg/150 mL  Status:  Discontinued        750 mg 150 mL/hr over 60 Minutes Intravenous Every 12 hours 11/01/19 1935 11/02/19 1105   11/02/19 0600  piperacillin-tazobactam (ZOSYN) IVPB 3.375 g  Status:  Discontinued        3.375 g 12.5 mL/hr over 240 Minutes Intravenous Every 8 hours 11/02/19 0311 11/03/19 0920   11/01/19 2200  ceFEPIme (MAXIPIME) 2 g in sodium chloride 0.9 % 100 mL IVPB  Status:  Discontinued        2 g 200 mL/hr over 30 Minutes Intravenous Every 12 hours 11/01/19 2143 11/02/19 0311   11/01/19 1915  vancomycin (VANCOREADY) IVPB 1500 mg/300 mL        1,500 mg 150 mL/hr over 120 Minutes Intravenous  Once 11/01/19 1909 11/01/19 2147   11/01/19 1845  cefTRIAXone (ROCEPHIN) 1 g in sodium chloride 0.9 % 100 mL IVPB        1 g 200 mL/hr over 30 Minutes Intravenous  Once 11/01/19 1842 11/01/19 2051   11/01/19 1845  azithromycin (ZITHROMAX) 500 mg in sodium chloride 0.9 % 250 mL IVPB        500 mg 250 mL/hr over 60 Minutes Intravenous  Once  11/01/19 1842 11/01/19 2051      PRN meds: acetaminophen (TYLENOL) oral liquid 160 mg/5 mL, docusate, influenza vac split quadrivalent PF   Objective: Vitals:   11/26/19 0908 11/26/19 1154  BP: 112/75 128/75  Pulse:  98  Resp:  17  Temp:  98.6 F (37 C)  SpO2:  98%    Intake/Output Summary (Last 24 hours) at 11/26/2019 1350 Last data filed at 11/26/2019 0500 Gross per 24 hour  Intake 1200 ml  Output 300 ml  Net 900 ml   Filed Weights   11/21/19 0412 11/23/19 0004 11/23/19 0121  Weight: 75.3 kg 75.8 kg 75.8 kg   Weight change:  Body mass index is 26.95 kg/m.   Physical Exam: General exam: Appears calm and comfortable.  Not in physical distress Skin: No rashes, lesions or ulcers. HEENT: Atraumatic, normocephalic, supple neck, no obvious bleeding Lungs: Clear to auscultation bilaterally CVS: Regular rate and rhythm, no murmur GI/Abd soft, nontender, nondistended, bowel sound present, PEG tube site intact CNS: Eyes open.  Inconsistent ability to follow some commands. Psychiatry: Unable to examine because of poor mental status Extremities: No pedal edema, no calf tenderness  Data Review: I have personally reviewed the laboratory data and studies available.  Recent Labs  Lab 11/20/19 0126 11/21/19 0622 11/24/19 0026 11/26/19 0053  WBC 6.6 5.8 4.6 6.3  NEUTROABS 5.2 4.5 3.0 4.6  HGB 11.4* 10.9* 11.8* 12.4*  HCT 35.0* 33.9* 37.3* 38.5*  MCV 92.8 94.7 95.4 94.6  PLT 187 187 251 239   Recent Labs  Lab 11/20/19 0126 11/21/19 0622 11/24/19 0026 11/26/19 0053  NA 136 135 138 136  K 3.6 4.1 4.0 4.1  CL 103 103 102 100  CO2 _0 GLUCOSE 114* 127* 121* 128*  BUN _1 CREATININE 0.50* 0.64 0.59* 0.75  CALCIUM 9.1 9.4 9.7 10.0  MG  --   --  1.9  --   PHOS  --   --  3.5  --     F/u labs ordered  Signed, Terrilee Croak, MD [facility] 11/26/2019

## 2019-11-26 NOTE — Progress Notes (Signed)
Patient alert, eyes open, appears to be able to look at RN and follow with eyes. RN noted that patient able to smile at times although facial grimaces at times. VSS, low grade temperature, tylenol will be given to help with discomfort and low grade fever as well. Copious amount of white to light tan colored secretion around trach area. Patient able to clear secretions. RT and RN will suction PRN. Continue to monitor.

## 2019-11-26 NOTE — Plan of Care (Signed)

## 2019-11-26 NOTE — Social Work (Signed)
CSW spoke with MD about getting a courtdate letter signed for pt, MD signed letter. CSW reached out to daughter of pt, [place] Dom, who requested the letter be emailed to [email or web address], CSW emailed both the letter as well as information for [employer].

## 2019-11-27 LAB — GLUCOSE, CAPILLARY
Glucose-Capillary: 114 mg/dL — ABNORMAL HIGH (ref 70–99)
Glucose-Capillary: 111 mg/dL — ABNORMAL HIGH (ref 70–99)
Glucose-Capillary: 139 mg/dL — ABNORMAL HIGH (ref 70–99)
Glucose-Capillary: 109 mg/dL — ABNORMAL HIGH (ref 70–99)

## 2019-11-27 NOTE — Progress Notes (Signed)
PROGRESS NOTE  Ryan Orozco  DOB: 02/19/1965  PCP: Default, Provider, MD YBO:[id number]  DOA: 11/01/2019  LOS: 26 days   Chief Complaint  Patient presents with  . Cardiac Arrest   Brief narrative: Ryan Orozco is a 54 y.o. male with PMH of HIV (dx 1995) and chronic alcohol abuse. Patient was brought to the ED by police on 2/77/8242 after events noted down outside a convenience store. EMS noted him pulseless with PEA.  Unknown downtime prior to EMS arrival.  EMS performed 2 rounds of CPR with 2 epi before ROSC, intubated en-route and brought to the ED.  In the ED, lactate was elevated to 4.4 post CPR, troponin IV 150, electrolytes within normal limit, blood alcohol level alcohol level 177.  UDS positive for THC, benzos. CT scan of head did not show any acute intracranial abnormality but showed moderate generalized cerebral atrophy advanced for age. He was admitted to ICU. Identified to have anoxic brain injury.  He failed extubation trials. 10/13, he received a tracheostomy.  He was gradually weaned off vent to trach collar. 10/15, IR inserted G-tube. 10/21, transferred out to hospitalist service.  Subjective: Patient was seen and examined this morning. No change in status.  Sleeping, opens eyes on touch. Did not cooperate when given motor command today.  Assessment/Plan: Cardiac arrest-prolonged downtime Anoxic brain injury Myoclonic seizures -Due to prolonged downtime after cardiac arrest. -ROSC achieved but patient might have had significant anoxic brain injury by then. -Neurology consultation was obtained. -EEG from 9/29 showed generalized polyspike's consistent with myoclonic seizures.  Patient was started on Keppra. -MRI from 10/3, 10/11 and repeat EEG from 10/8 all suggestive of severe diffuse encephalopathy likely related to anoxic/hypoxic brain injury. -Per neurology note, "it is likely that patient will continue to have more neurologic recovery and possibly  improvement in speech", though there has been little progression in his physical exam so uncertain if he will improve to a meaningful degree -On serial examinations, patient has been noted to move his toes on command.  But otherwise no meaningful change in mental status.  Acute hypoxemic respiratory failure Tracheostomy and PEG dependence  -Again secondary to anoxic brain injury -Failed extubation trials.  Tracheostomy on 10/13.  Currently on 5 L oxygen by trach collar.  Wean down as tolerated. -Pulmonary following for tracheostomy care.  Currently has cuffless size 6 tube. -Target oxygen saturation more than 90%. -10/15, IR inserted PEG tube.  Continue tube feeding. -SLP to continue to work w/ PMV  Essential hypertension -Currently on Norvasc. Continue to monitor blood pressure.  Bilateral inguinal hernia Left hydrocele -Testicular US 10/10>showed large left hydrocele and left inguinal hernia. No evidence of testicular mass. -CT abdomen pelvis 10/12: Right inguinal hernia and partial small bowel obstruction, large left inguinal hernia -Patient has a Flexi-Seal. Seems to have soft stool. Okay to remove Flexi-Seal.  Protein calorie malnutrition -Nutrition consulted.  Note as below. -Continue tube feeding.  Chronic alcohol abuse abuse -Thiamine and folate.  HIV/AIDS -Cont Bactrim, Tivicay and Descovy  Mobility: Needs to work with PT once mental status improves Code Status:   Code Status: Full Code.  Palliative care consulted Nutritional status: Body mass index is 25.82 kg/m. Nutrition Problem: Increased nutrient needs Etiology: acute illness Signs/Symptoms: estimated needs Diet Order            Diet NPO time specified  Diet effective midnight                 DVT prophylaxis: heparin injection 5,000  Units Start: 11/16/19 2200 SCDs Start: 11/01/19 2113   Antimicrobials:  HIV meds Fluid: None Consultants: Critical care, neurology Family Communication:  Discussed  with patient daughter at bedside on 10/23.  Status is: Inpatient  Remains inpatient appropriate because:Unsafe d/c plan   Dispo: The patient is from: Home              Anticipated d/c is to: SNF              Anticipated d/c date is: > 3 days              Patient currently is not medically stable to d/c.       Infusions:  . sodium chloride 10 mL/hr at 11/26/19 0611  . feeding supplement (OSMOLITE 1.5 CAL) 1,000 mL (11/27/19 0511)    Scheduled Meds: . amLODipine  10 mg Per Tube Daily  . chlorhexidine gluconate (MEDLINE KIT)  15 mL Mouth Rinse BID  . Chlorhexidine Gluconate Cloth  6 each Topical Q0600  . dolutegravir  50 mg Oral Daily  . emtricitabine-tenofovir AF  1 tablet Per Tube Daily  . feeding supplement (PROSource TF)  45 mL Per Tube TID  . folic acid  1 mg Per Tube Daily  . heparin injection (subcutaneous)  5,000 Units Subcutaneous Q8H  . levETIRAcetam  1,500 mg Per Tube BID  . mouth rinse  15 mL Mouth Rinse 10 times per day  . polyethylene glycol  17 g Per Tube Daily  . scopolamine  1 patch Transdermal Q72H  . sulfamethoxazole-trimethoprim  1 tablet Per Tube Once per day on Mon Wed Fri  . thiamine  100 mg Per Tube Daily    Antimicrobials: Anti-infectives (From admission, onward)   Start     Dose/Rate Route Frequency Ordered Stop   11/17/19 1548  ceFAZolin (ANCEF) 2-4 GM/100ML-% IVPB       Note to Pharmacy: Domenick Bookbinder   : cabinet override      11/17/19 1548 11/18/19 0359   11/16/19 1515  ceFAZolin (ANCEF) IVPB 2g/100 mL premix        2 g 200 mL/hr over 30 Minutes Intravenous To Radiology 11/16/19 1511 11/17/19 1625   11/15/19 0900  sulfamethoxazole-trimethoprim (BACTRIM DS) 800-160 MG per tablet 1 tablet        1 tablet Per Tube Once per day on Mon Wed Fri 11/13/19 1906     11/13/19 1615  dolutegravir (TIVICAY) tablet 50 mg        50 mg Oral Daily 11/13/19 1521     11/13/19 1615  emtricitabine-tenofovir AF (DESCOVY) 200-25 MG per tablet 1 tablet        1  tablet Per Tube Daily 11/13/19 1521     11/13/19 1530  sulfamethoxazole-trimethoprim (BACTRIM DS) 800-160 MG per tablet 1 tablet  Status:  Discontinued        1 tablet Oral Once per day on Mon Wed Fri 11/13/19 1441 11/13/19 1906   11/13/19 1500  bictegravir-emtricitabine-tenofovir AF (BIKTARVY) 50-200-25 MG per tablet 1 tablet  Status:  Discontinued        1 tablet Oral Daily 11/13/19 1403 11/13/19 1521   11/10/19 1000  erythromycin 250 mg in sodium chloride 0.9 % 100 mL IVPB        250 mg 100 mL/hr over 60 Minutes Intravenous Every 8 hours 11/10/19 0907 11/11/19 2000   11/07/19 1200  ceFEPIme (MAXIPIME) 2 g in sodium chloride 0.9 % 100 mL IVPB        2  g 200 mL/hr over 30 Minutes Intravenous Every 8 hours 11/07/19 1013 11/13/19 2127   11/03/19 1200  cefTRIAXone (ROCEPHIN) 2 g in sodium chloride 0.9 % 100 mL IVPB        2 g 200 mL/hr over 30 Minutes Intravenous Every 24 hours 11/03/19 0922 11/05/19 1427   11/02/19 0800  vancomycin (VANCOREADY) IVPB 750 mg/150 mL  Status:  Discontinued        750 mg 150 mL/hr over 60 Minutes Intravenous Every 12 hours 11/01/19 1935 11/02/19 1105   11/02/19 0600  piperacillin-tazobactam (ZOSYN) IVPB 3.375 g  Status:  Discontinued        3.375 g 12.5 mL/hr over 240 Minutes Intravenous Every 8 hours 11/02/19 0311 11/03/19 0920   11/01/19 2200  ceFEPIme (MAXIPIME) 2 g in sodium chloride 0.9 % 100 mL IVPB  Status:  Discontinued        2 g 200 mL/hr over 30 Minutes Intravenous Every 12 hours 11/01/19 2143 11/02/19 0311   11/01/19 1915  vancomycin (VANCOREADY) IVPB 1500 mg/300 mL        1,500 mg 150 mL/hr over 120 Minutes Intravenous  Once 11/01/19 1909 11/01/19 2147   11/01/19 1845  cefTRIAXone (ROCEPHIN) 1 g in sodium chloride 0.9 % 100 mL IVPB        1 g 200 mL/hr over 30 Minutes Intravenous  Once 11/01/19 1842 11/01/19 2051   11/01/19 1845  azithromycin (ZITHROMAX) 500 mg in sodium chloride 0.9 % 250 mL IVPB        500 mg 250 mL/hr over 60 Minutes  Intravenous  Once 11/01/19 1842 11/01/19 2051      PRN meds: acetaminophen (TYLENOL) oral liquid 160 mg/5 mL, docusate, influenza vac split quadrivalent PF   Objective: Vitals:   11/27/19 0912 11/27/19 1123  BP:    Pulse: 77 (!) 107  Resp: 16 20  Temp:    SpO2: 97% 99%    Intake/Output Summary (Last 24 hours) at 11/27/2019 1130 Last data filed at 11/26/2019 2330 Gross per 24 hour  Intake --  Output 850 ml  Net -850 ml   Filed Weights   11/23/19 0004 11/23/19 0121 11/27/19 0500  Weight: 75.8 kg 75.8 kg 72.6 kg   Weight change:  Body mass index is 25.82 kg/m.   Physical Exam: General exam: Appears calm and comfortable.  Not in physical distress Skin: No rashes, lesions or ulcers. HEENT: Atraumatic, normocephalic, supple neck, no obvious bleeding Lungs: Clear to auscultation bilaterally CVS: Regular rate and rhythm, no murmur GI/Abd soft, nontender, nondistended, bowel sound present, PEG tube site intact CNS: Eyes open.  Inconsistent ability to follow some commands. Psychiatry: Unable to examine because of poor mental status Extremities: No pedal edema, no calf tenderness  Data Review: I have personally reviewed the laboratory data and studies available.  Recent Labs  Lab 11/21/19 0622 11/24/19 0026 11/26/19 0053  WBC 5.8 4.6 6.3  NEUTROABS 4.5 3.0 4.6  HGB 10.9* 11.8* 12.4*  HCT 33.9* 37.3* 38.5*  MCV 94.7 95.4 94.6  PLT 187 251 239   Recent Labs  Lab 11/21/19 0622 11/24/19 0026 11/26/19 0053  NA 135 138 136  K 4.1 4.0 4.1  CL 103 102 100  CO2 _0 GLUCOSE 127* 121* 128*  BUN _1 CREATININE 0.64 0.59* 0.75  CALCIUM 9.4 9.7 10.0  MG  --  1.9  --   PHOS  --  3.5  --     F/u labs ordered  Signed, Terrilee Croak, MD [facility] 11/27/2019

## 2019-11-27 NOTE — Progress Notes (Signed)
NAMELove Orozco, MRN:  [id number], DOB:  24-Oct-1965, LOS: 26 ADMISSION DATE:  11/01/2019, CONSULTATION DATE:  9/29 REFERRING MD:  EDP, CHIEF COMPLAINT:  Cardiac arrest   Brief History   54 y/o male with HIV found down outside a convenience store on 9/27, received out of hospital CPR.  After ICU admission has had acute encephalopathy, concern for anoxic brain injury.  Received a tracheostomy 10/13.    Past Medical History  ETOH Abuse HIV - dx 1995  Significant Hospital Events   9/29 Admit post PEA arrest.  UDS positive for THC, benzo's.  ETOH 177 10/05 EEG ongoing. Versed restarted overnight due to agitation. Nicardipine increased.  10/06 Developed tachypnea with WUA, no follow commands off sedation  10/07 Vomiting, TF held / restarted with recurrent vomiting 10/16 tracheostomy collar for 9 hours 10/18->10/19 off vent all night  Consults:  Neurology  Procedures:  ETT 9/29 >>10/13 Tracheostomy>>10/3  Significant Diagnostic Tests:  CT head 9/29 >  No evidence of acute intracranial abnormality. Moderate generalized cerebral atrophy, advanced for age. Chronic medially displaced fracture deformity of the right lamina papyracea. Mild ethmoid and maxillary sinus mucosal thickening.  CT cervical spine 9/29 > No evidence of acute fracture to the cervical spine. Partially imaged airspace disease within the imaged lung apices (extensive on the left). Clinical correlation is recommended. 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  EEG 9/29 > Patient was noted to have frequent episodes of axial jerking with eye opening. Concomitant EEG showed generalized polyspikes consistent with myoclonic seizures. EEG also showed continuous generalized background suppression. EEG was not reactive to tactile stimulation. Hyperventilation and photic stimulation were not performed  ECHO 9/30 > No evidence of wall motion  abnormality  MRI 10/3 > Mild DWI hyperintensity involving the caudate and possibly putamen bilaterally. Additionally, possible subtle FLAIR hyperintensity involving the cerebellum, cortex diffusely, and bilateral basal ganglia. Although not diagnostic, these findings could be seen with early hypoxic/ischemic brain injury in this patient status post PEA arrest.   EEG 10/08> severe diffuse encephalopathy likely related to anoxic/hypoxic brain injury  Testicular US 10/10> showed large left hydrocele and left inguinal hernia. No evidence of testicular mass.  MRI brain 10/11: evolving hypoxic, anoxic brain injury compared to previous MRI   CT abdomen pelvis 10/12: Right inguinal hernia and partial small bowel obstruction, large left inguinal hernia  Abdominal x-ray 10/13: Small bowel obstruction  Micro Data:  BCx2 9/29 >> negative  Urine 9/29 >> UA few bacteria, WBC, RBC, CaOxalate, and Mucus Covid, Flu A/B 9/29 >> negative Tracheal aspirate 10/5 >> normal flora   Antimicrobials:  Vancomycin 9/29 >> 9/30 Cefepime 9/29 >> 9/30 Cefepime 10/5 >> 10/11  Interim history/subjective:  Continues to tolerate trach collar. Secretions remain copious.  Objective   Blood pressure (!) 121/96, pulse (!) 104, temperature 99.3 F (37.4 C), temperature source Axillary, resp. rate 20, height 5\' 6"  (1.676 m), weight 72.6 kg, SpO2 97 %.    FiO2 (%):  [28 %] 28 %   Intake/Output Summary (Last 24 hours) at 11/27/2019 0907 Last data filed at 11/26/2019 2330 Gross per 24 hour  Intake -  Output 850 ml  Net -850 ml   Filed Weights   11/23/19 0004 11/23/19 0121 11/27/19 0500  Weight: 75.8 kg 75.8 kg 72.6 kg    Examination: Gen: No acute distress, laying in bed, 35% ATC HENT: Trach in place, clear secretions. NCAT. MMM Pulm: course  breath sounds bilaterally. No wheezing or rhonchi. Card: RRR, no murmurs. Abd: Soft, non-tender, BS+, PEG in place. Ext: no edema Neuro  Moving spontaneously,  will open eyes to voice.  Resolved Hospital Problem list   Partial small bowel obstruction > resolved  Assessment & Plan:  Trach/ vent dependence s/p cardiac arrest c/b Acute hypoxemic respiratory failure Off vent since 10/18. Currently on 5L trach collar. Has copious tan/white secretions.  Plan Continue 6 cuffless trach (continues to have increased amounts of secretions)  Continue scopolamine patch for secretions Cont humidified oxygen and titrate down for sats > 90% Cont routine trach care.  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

## 2019-11-27 NOTE — Progress Notes (Signed)
Nutrition Follow-up  DOCUMENTATION CODES:   Not applicable  INTERVENTION:  Please obtain new measured weight.  Continue via PEG: -Osmolite 1.5 cal @ 57m/hr (14432m -4545mrosource TID  Provides 2280 kcals, 123 grams protein, 1097m87mee water   NUTRITION DIAGNOSIS:   Increased nutrient needs related to acute illness as evidenced by estimated needs.  ongoing  GOAL:   Patient will meet greater than or equal to 90% of their needs  Met with TF  MONITOR:   Vent status, Skin, TF tolerance, Weight trends, Labs, I & O's  REASON FOR ASSESSMENT:   Ventilator, Consult Enteral/tube feeding initiation and management  ASSESSMENT:   Patient with PMH significant for HIV and ETOH use. Presents this admission with cardiac arrest and suspected aspiration in setting of heavy ETOH use.  10/07 Vomiting, TF held / restarted with recurrent vomiting 10/12 R/L inguinal hernia, partial SBO  10/13 Trach 10/15 PEG 10/18 off vent  Pt remains on trach collar and is tolerating TF via PEG. Current orders are for Osmolite 1.5 cal @ 60ml55mwith 45ml 75mource TF TID. Provides 2280 kcals, 123 grams protein, 1097ml f8mwater; meets 100% of needs.   Admission wt: 78.9 kg Current wt: 72.6 kg (unsure this is an accurate reading; this wt appears to have been reported as opposed to recorded via scale; recommend obtaining new measured wt)  UOP: 850ml x285murs  Labs reviewed. Medications: Folvite, Miralax, Thiamine  Diet Order:   Diet Order            Diet NPO time specified  Diet effective midnight                 EDUCATION NEEDS:   Not appropriate for education at this time  Skin:  Skin Assessment: Skin Integrity Issues: Skin Integrity Issues:: Stage III, Other (Comment) Stage III: anus Other: puncture abdomen  Last BM:  10/23  Height:   Ht Readings from Last 1 Encounters:  11/01/19 _0  (1.676 m)    Weight:   Wt Readings from Last 1 Encounters:  11/27/19 72.6 kg    BMI:  Body mass index is 25.82 kg/m.  Estimated Nutritional Needs:   Kcal:  2100-2300 kcal  Protein:  115-130 g  Fluid:  >/= 2 L/day    Tanith Dagostino ALarkin Ina, LDN RD pager number and weekend/on-call pager number located [email or web address]

## 2019-11-28 LAB — GLUCOSE, CAPILLARY
Glucose-Capillary: 116 mg/dL — ABNORMAL HIGH (ref 70–99)
Glucose-Capillary: 115 mg/dL — ABNORMAL HIGH (ref 70–99)
Glucose-Capillary: 107 mg/dL — ABNORMAL HIGH (ref 70–99)
Glucose-Capillary: 107 mg/dL — ABNORMAL HIGH (ref 70–99)
Glucose-Capillary: 124 mg/dL — ABNORMAL HIGH (ref 70–99)
Glucose-Capillary: 131 mg/dL — ABNORMAL HIGH (ref 70–99)

## 2019-11-28 NOTE — Plan of Care (Signed)

## 2019-11-28 NOTE — Progress Notes (Signed)
  Speech Language Pathology Treatment: [name] Speaking valve;Cognitive-Linquistic  Patient Details Name: Ryan Orozco MRN: [id number] DOB: [birth year] Today's Date: 11/28/2019 Time: 2010-0712 SLP Time Calculation (min) (ACUTE ONLY): 18 min  Assessment / Plan / Recommendation Clinical Impression  Ryan Orozco was awake and remained adequately alert throughout session. Indications of strong reflexive cough given copious mucous on chest. He does not have a traumatic brain injury but Rancho scale being used for purpose of tracking progress and he continues to resemble a level II (generalized response). He did not follow commands or appear to purposefully focus on therapist on either side of his bed. Adequate exhalations via upper airway when PMV donned without back pressure when doffed intermittently. Wore for total of approximately 15 minutes. Able to transit air through upper airway to produce spontaneous vocalizations throughout session (vocalic/throat clearing). HR 102, SpO2 89-94% and stable respirations. SLP will continue to follow for cognition and use of upper airway for increased respiratory endurance and vocalizations.     HPI HPI: 54 y/o male with hx of HIV, ETOH abuse found down outside a convenience store on 9/27, received out of hospital CPR. Presents with acute hypoxic respiratory failure s/p cardiac arrest, acute encephalopathy due to hypoxic brain injury. Intubated 9/27-10/13. Received a tracheostomy 10/13, treachestomy collar trial for 9 hours on 10/16. CXR on 10/17 improving and shows left lung opacities have essentially resolved.      SLP Plan  Continue with current plan of care       Recommendations         Patient may use Passy-Muir Speech Valve: with SLP only PMSV Supervision: Full         Oral Care Recommendations: Oral care QID Follow up Recommendations: LTACH;Skilled Nursing facility SLP Visit Diagnosis: Aphonia (R49.1);Cognitive communication deficit  (R97.588) Plan: Continue with current plan of care                      Royce Macadamia 11/28/2019, 12:29 PM [name].Ed Nurse, children's [phone] Office [phone]

## 2019-11-28 NOTE — Progress Notes (Signed)
PROGRESS NOTE  [place]  DOB: 23-Jan-1966  PCP: Default, Provider, MD PIR:[id number]  DOA: 11/01/2019  LOS: 27 days   Chief Complaint  Patient presents with  . Cardiac Arrest   Brief narrative: Ryan Orozco is a 54 y.o. male with PMH of HIV (dx 1995) and chronic alcohol abuse. Patient was brought to the ED by police on 08/02/1599 after events noted down outside a convenience store. EMS noted him pulseless with PEA.  Unknown downtime prior to EMS arrival.  EMS performed 2 rounds of CPR with 2 epi before ROSC, intubated en-route and brought to the ED.  In the ED, lactate was elevated to 4.4 post CPR, troponin IV 150, electrolytes within normal limit, blood alcohol level alcohol level 177.  UDS positive for THC, benzos. CT scan of head did not show any acute intracranial abnormality but showed moderate generalized cerebral atrophy advanced for age. He was admitted to ICU. Identified to have anoxic brain injury.  He failed extubation trials. 10/13, he received a tracheostomy.  He was gradually weaned off vent to trach collar. 10/15, IR inserted G-tube. 10/21, transferred out to hospitalist service.  Subjective: Patient was seen and examined this morning. No change in status. Vital signs remained stable.  Assessment/Plan: Cardiac arrest-prolonged downtime Anoxic brain injury Myoclonic seizures -Due to prolonged downtime after cardiac arrest. -ROSC achieved but patient might have had significant anoxic brain injury by then. -Neurology consultation was obtained. -EEG from 9/29 showed generalized polyspike's consistent with myoclonic seizures.  Patient was started on Keppra. -MRI from 10/3, 10/11 and repeat EEG from 10/8 all suggestive of severe diffuse encephalopathy likely related to anoxic/hypoxic brain injury. -Per neurology note, "it is likely that patient will continue to have more neurologic recovery and possibly improvement in speech", though there has been little  progression in his physical exam so uncertain if he will improve to a meaningful degree -On serial examinations, patient has been noted to move his toes on command.  But otherwise no meaningful change in mental status.  Acute hypoxemic respiratory failure Tracheostomy and PEG dependence  -Again secondary to anoxic brain injury -Failed extubation trials.  Tracheostomy on 10/13.  Currently on 5 L oxygen by trach collar.  Wean down as tolerated. -Pulmonary following for tracheostomy care.  Currently has cuffless size 6 tube. -Target oxygen saturation more than 90%. -10/15, IR inserted PEG tube.  Continue tube feeding. -SLP to continue to work w/ PMV  Essential hypertension -Currently on Norvasc. Continue to monitor blood pressure.  Bilateral inguinal hernia Left hydrocele -Testicular US 10/10>showed large left hydrocele and left inguinal hernia. No evidence of testicular mass. -CT abdomen pelvis 10/12: Right inguinal hernia and partial small bowel obstruction, large left inguinal hernia -Patient has a Flexi-Seal. Seems to have soft stool. Okay to remove Flexi-Seal.  Protein calorie malnutrition -Nutrition consulted.  Note as below. -Continue tube feeding.  Chronic alcohol abuse abuse -Thiamine and folate.  HIV/AIDS -Cont Bactrim, Tivicay and Descovy  Mobility: Needs to work with PT once mental status improves Code Status:   Code Status: Full Code.  Palliative care consult appreciated Nutritional status: Body mass index is 25.82 kg/m. Nutrition Problem: Increased nutrient needs Etiology: acute illness Signs/Symptoms: estimated needs Diet Order            Diet NPO time specified  Diet effective midnight                 DVT prophylaxis: heparin injection 5,000 Units Start: 11/16/19 2200 SCDs Start: 11/01/19 2113  Antimicrobials:  HIV meds Fluid: None Consultants: Critical care, neurology Family Communication:  Discussed with patient daughter at bedside on  10/23.  Status is: Inpatient  Remains inpatient appropriate because:Unsafe d/c plan   Dispo: The patient is from: Home              Anticipated d/c is to: SNF              Anticipated d/c date is: > 3 days              Patient currently is not medically stable to d/c.       Infusions:  . sodium chloride 10 mL/hr at 11/27/19 1222  . feeding supplement (OSMOLITE 1.5 CAL) 1,000 mL (11/27/19 2358)    Scheduled Meds: . amLODipine  10 mg Per Tube Daily  . chlorhexidine gluconate (MEDLINE KIT)  15 mL Mouth Rinse BID  . Chlorhexidine Gluconate Cloth  6 each Topical Q0600  . dolutegravir  50 mg Oral Daily  . emtricitabine-tenofovir AF  1 tablet Per Tube Daily  . feeding supplement (PROSource TF)  45 mL Per Tube TID  . folic acid  1 mg Per Tube Daily  . heparin injection (subcutaneous)  5,000 Units Subcutaneous Q8H  . levETIRAcetam  1,500 mg Per Tube BID  . mouth rinse  15 mL Mouth Rinse 10 times per day  . polyethylene glycol  17 g Per Tube Daily  . scopolamine  1 patch Transdermal Q72H  . sulfamethoxazole-trimethoprim  1 tablet Per Tube Once per day on Mon Wed Fri  . thiamine  100 mg Per Tube Daily    Antimicrobials: Anti-infectives (From admission, onward)   Start     Dose/Rate Route Frequency Ordered Stop   11/17/19 1548  ceFAZolin (ANCEF) 2-4 GM/100ML-% IVPB       Note to Pharmacy: Domenick Bookbinder   : cabinet override      11/17/19 1548 11/18/19 0359   11/16/19 1515  ceFAZolin (ANCEF) IVPB 2g/100 mL premix        2 g 200 mL/hr over 30 Minutes Intravenous To Radiology 11/16/19 1511 11/17/19 1625   11/15/19 0900  sulfamethoxazole-trimethoprim (BACTRIM DS) 800-160 MG per tablet 1 tablet        1 tablet Per Tube Once per day on Mon Wed Fri 11/13/19 1906     11/13/19 1615  dolutegravir (TIVICAY) tablet 50 mg        50 mg Oral Daily 11/13/19 1521     11/13/19 1615  emtricitabine-tenofovir AF (DESCOVY) 200-25 MG per tablet 1 tablet        1 tablet Per Tube Daily 11/13/19 1521      11/13/19 1530  sulfamethoxazole-trimethoprim (BACTRIM DS) 800-160 MG per tablet 1 tablet  Status:  Discontinued        1 tablet Oral Once per day on Mon Wed Fri 11/13/19 1441 11/13/19 1906   11/13/19 1500  bictegravir-emtricitabine-tenofovir AF (BIKTARVY) 50-200-25 MG per tablet 1 tablet  Status:  Discontinued        1 tablet Oral Daily 11/13/19 1403 11/13/19 1521   11/10/19 1000  erythromycin 250 mg in sodium chloride 0.9 % 100 mL IVPB        250 mg 100 mL/hr over 60 Minutes Intravenous Every 8 hours 11/10/19 0907 11/11/19 2000   11/07/19 1200  ceFEPIme (MAXIPIME) 2 g in sodium chloride 0.9 % 100 mL IVPB        2 g 200 mL/hr over 30 Minutes Intravenous Every 8 hours  11/07/19 1013 11/13/19 2127   11/03/19 1200  cefTRIAXone (ROCEPHIN) 2 g in sodium chloride 0.9 % 100 mL IVPB        2 g 200 mL/hr over 30 Minutes Intravenous Every 24 hours 11/03/19 0922 11/05/19 1427   11/02/19 0800  vancomycin (VANCOREADY) IVPB 750 mg/150 mL  Status:  Discontinued        750 mg 150 mL/hr over 60 Minutes Intravenous Every 12 hours 11/01/19 1935 11/02/19 1105   11/02/19 0600  piperacillin-tazobactam (ZOSYN) IVPB 3.375 g  Status:  Discontinued        3.375 g 12.5 mL/hr over 240 Minutes Intravenous Every 8 hours 11/02/19 0311 11/03/19 0920   11/01/19 2200  ceFEPIme (MAXIPIME) 2 g in sodium chloride 0.9 % 100 mL IVPB  Status:  Discontinued        2 g 200 mL/hr over 30 Minutes Intravenous Every 12 hours 11/01/19 2143 11/02/19 0311   11/01/19 1915  vancomycin (VANCOREADY) IVPB 1500 mg/300 mL        1,500 mg 150 mL/hr over 120 Minutes Intravenous  Once 11/01/19 1909 11/01/19 2147   11/01/19 1845  cefTRIAXone (ROCEPHIN) 1 g in sodium chloride 0.9 % 100 mL IVPB        1 g 200 mL/hr over 30 Minutes Intravenous  Once 11/01/19 1842 11/01/19 2051   11/01/19 1845  azithromycin (ZITHROMAX) 500 mg in sodium chloride 0.9 % 250 mL IVPB        500 mg 250 mL/hr over 60 Minutes Intravenous  Once 11/01/19 1842 11/01/19  2051      PRN meds: acetaminophen (TYLENOL) oral liquid 160 mg/5 mL, docusate, influenza vac split quadrivalent PF   Objective: Vitals:   11/28/19 0826 11/28/19 0846  BP: 100/89 100/89  Pulse: 77 77  Resp: 19 20  Temp: 97.8 F (36.6 C)   SpO2: 94%     Intake/Output Summary (Last 24 hours) at 11/28/2019 1643 Last data filed at 11/28/2019 0442 Gross per 24 hour  Intake 2201 ml  Output 0 ml  Net 2201 ml   Filed Weights   11/23/19 0004 11/23/19 0121 11/27/19 0500  Weight: 75.8 kg 75.8 kg 72.6 kg   Weight change:  Body mass index is 25.82 kg/m.   Physical Exam: General exam: Appears calm and comfortable.  Not in physical distress Skin: No rashes, lesions or ulcers. HEENT: Has tracheostomy tube anteriorly. Lungs: Clear to auscultation bilaterally CVS: Regular rate and rhythm, no murmur GI/Abd soft, nontender, nondistended, bowel sound present, PEG tube site intact CNS: Eyes open.  Inconsistent ability to follow some commands. Psychiatry: Unable to examine because of poor mental status Extremities: No pedal edema, no calf tenderness  Data Review: I have personally reviewed the laboratory data and studies available.  Recent Labs  Lab 11/24/19 0026 11/26/19 0053  WBC 4.6 6.3  NEUTROABS 3.0 4.6  HGB 11.8* 12.4*  HCT 37.3* 38.5*  MCV 95.4 94.6  PLT 251 239   Recent Labs  Lab 11/24/19 0026 11/26/19 0053  NA 138 136  K 4.0 4.1  CL 102 100  CO2 25 25  GLUCOSE 121* 128*  BUN 16 13  CREATININE 0.59* 0.75  CALCIUM 9.7 10.0  MG 1.9  --   PHOS 3.5  --     F/u labs ordered  Signed, Terrilee Croak, MD [facility] 11/28/2019

## 2019-11-29 LAB — GLUCOSE, CAPILLARY
Glucose-Capillary: 124 mg/dL — ABNORMAL HIGH (ref 70–99)
Glucose-Capillary: 103 mg/dL — ABNORMAL HIGH (ref 70–99)
Glucose-Capillary: 115 mg/dL — ABNORMAL HIGH (ref 70–99)
Glucose-Capillary: 120 mg/dL — ABNORMAL HIGH (ref 70–99)
Glucose-Capillary: 116 mg/dL — ABNORMAL HIGH (ref 70–99)
Glucose-Capillary: 110 mg/dL — ABNORMAL HIGH (ref 70–99)

## 2019-11-29 NOTE — Progress Notes (Signed)
PROGRESS NOTE  [place]  DOB: April 12, 1965  PCP: Default, Provider, MD NOI:[id number]  DOA: 11/01/2019  LOS: 28 days   Chief Complaint  Patient presents with  . Cardiac Arrest   Brief narrative: Ryan Orozco is a 54 y.o. male with PMH of HIV (dx 1995) and chronic alcohol abuse. Patient was brought to the ED by police on 6/94/5038 after events noted down outside a convenience store. EMS noted him pulseless with PEA.  Unknown downtime prior to EMS arrival.  EMS performed 2 rounds of CPR with 2 epi before ROSC, intubated en-route and brought to the ED.  In the ED, lactate was elevated to 4.4 post CPR, troponin IV 150, electrolytes within normal limit, blood alcohol level alcohol level 177.  UDS positive for THC, benzos. CT scan of head did not show any acute intracranial abnormality but showed moderate generalized cerebral atrophy advanced for age. He was admitted to ICU. Identified to have anoxic brain injury.  He failed extubation trials. 10/13, he received a tracheostomy.  He was gradually weaned off vent to trach collar. 10/15, IR inserted G-tube. 10/21, transferred out to hospitalist service.  Subjective: Patient was seen and examined this morning. No change in status. Vital signs remains stable.  Assessment/Plan: Cardiac arrest-prolonged downtime Anoxic brain injury Myoclonic seizures -Due to prolonged downtime after cardiac arrest. -ROSC achieved but patient might have had significant anoxic brain injury by then. -Neurology consultation was obtained. -EEG from 9/29 showed generalized polyspike's consistent with myoclonic seizures.  Patient was started on Keppra. -MRI from 10/3, 10/11 and repeat EEG from 10/8 all suggestive of severe diffuse encephalopathy likely related to anoxic/hypoxic brain injury. -Per neurology note, "it is likely that patient will continue to have more neurologic recovery and possibly improvement in speech", though there has been little  progression in his physical exam so uncertain if he will improve to a meaningful degree -On serial examinations, patient has been noted to move his toes on command.  But otherwise no meaningful change in mental status.  Acute hypoxemic respiratory failure Tracheostomy and PEG dependence  -Again secondary to anoxic brain injury -Failed extubation trials.  Tracheostomy on 10/13.  Currently on 5 L oxygen by trach collar.  Wean down as tolerated. -Pulmonary following for tracheostomy care.  Currently has cuffless size 6 tube. -Target oxygen saturation more than 90%. -10/15, IR inserted PEG tube.  Continue tube feeding. -SLP to continue to work w/ PMV  Essential hypertension -Currently on Norvasc. Continue to monitor blood pressure.  Bilateral inguinal hernia Left hydrocele -Testicular US 10/10>showed large left hydrocele and left inguinal hernia. No evidence of testicular mass. -CT abdomen pelvis 10/12: Right inguinal hernia and partial small bowel obstruction, large left inguinal hernia -Patient has a Flexi-Seal. Seems to have soft stool. Okay to remove Flexi-Seal.  Protein calorie malnutrition -Nutrition consulted.  Note as below. -Continue tube feeding.  Chronic alcohol abuse abuse -Thiamine and folate.  HIV/AIDS -Cont Bactrim, Tivicay and Descovy  Mobility: Needs to work with PT once mental status improves Code Status:   Code Status: Full Code.  Palliative care consult appreciated Nutritional status: Body mass index is 26.29 kg/m. Nutrition Problem: Increased nutrient needs Etiology: acute illness Signs/Symptoms: estimated needs Diet Order            Diet NPO time specified  Diet effective midnight                 DVT prophylaxis: heparin injection 5,000 Units Start: 11/16/19 2200 SCDs Start: 11/01/19 2113  Antimicrobials:  HIV meds Fluid: None Consultants: Critical care, neurology Family Communication:  Discussed with patient daughter at bedside on  10/23.  Status is: Inpatient  Remains inpatient appropriate because:Unsafe d/c plan   Dispo: The patient is from: Home              Anticipated d/c is to: SNF              Anticipated d/c date is: > 3 days              Patient currently is not medically stable to d/c.       Infusions:  . sodium chloride 10 mL/hr at 11/29/19 0307  . feeding supplement (OSMOLITE 1.5 CAL) 1,000 mL (11/29/19 0307)    Scheduled Meds: . amLODipine  10 mg Per Tube Daily  . chlorhexidine gluconate (MEDLINE KIT)  15 mL Mouth Rinse BID  . Chlorhexidine Gluconate Cloth  6 each Topical Q0600  . dolutegravir  50 mg Oral Daily  . emtricitabine-tenofovir AF  1 tablet Per Tube Daily  . feeding supplement (PROSource TF)  45 mL Per Tube TID  . folic acid  1 mg Per Tube Daily  . heparin injection (subcutaneous)  5,000 Units Subcutaneous Q8H  . levETIRAcetam  1,500 mg Per Tube BID  . mouth rinse  15 mL Mouth Rinse 10 times per day  . polyethylene glycol  17 g Per Tube Daily  . scopolamine  1 patch Transdermal Q72H  . sulfamethoxazole-trimethoprim  1 tablet Per Tube Once per day on Mon Wed Fri  . thiamine  100 mg Per Tube Daily    Antimicrobials: Anti-infectives (From admission, onward)   Start     Dose/Rate Route Frequency Ordered Stop   11/17/19 1548  ceFAZolin (ANCEF) 2-4 GM/100ML-% IVPB       Note to Pharmacy: Domenick Bookbinder   : cabinet override      11/17/19 1548 11/18/19 0359   11/16/19 1515  ceFAZolin (ANCEF) IVPB 2g/100 mL premix        2 g 200 mL/hr over 30 Minutes Intravenous To Radiology 11/16/19 1511 11/17/19 1625   11/15/19 0900  sulfamethoxazole-trimethoprim (BACTRIM DS) 800-160 MG per tablet 1 tablet        1 tablet Per Tube Once per day on Mon Wed Fri 11/13/19 1906     11/13/19 1615  dolutegravir (TIVICAY) tablet 50 mg        50 mg Oral Daily 11/13/19 1521     11/13/19 1615  emtricitabine-tenofovir AF (DESCOVY) 200-25 MG per tablet 1 tablet        1 tablet Per Tube Daily 11/13/19 1521      11/13/19 1530  sulfamethoxazole-trimethoprim (BACTRIM DS) 800-160 MG per tablet 1 tablet  Status:  Discontinued        1 tablet Oral Once per day on Mon Wed Fri 11/13/19 1441 11/13/19 1906   11/13/19 1500  bictegravir-emtricitabine-tenofovir AF (BIKTARVY) 50-200-25 MG per tablet 1 tablet  Status:  Discontinued        1 tablet Oral Daily 11/13/19 1403 11/13/19 1521   11/10/19 1000  erythromycin 250 mg in sodium chloride 0.9 % 100 mL IVPB        250 mg 100 mL/hr over 60 Minutes Intravenous Every 8 hours 11/10/19 0907 11/11/19 2000   11/07/19 1200  ceFEPIme (MAXIPIME) 2 g in sodium chloride 0.9 % 100 mL IVPB        2 g 200 mL/hr over 30 Minutes Intravenous Every 8 hours  11/07/19 1013 11/13/19 2127   11/03/19 1200  cefTRIAXone (ROCEPHIN) 2 g in sodium chloride 0.9 % 100 mL IVPB        2 g 200 mL/hr over 30 Minutes Intravenous Every 24 hours 11/03/19 0922 11/05/19 1427   11/02/19 0800  vancomycin (VANCOREADY) IVPB 750 mg/150 mL  Status:  Discontinued        750 mg 150 mL/hr over 60 Minutes Intravenous Every 12 hours 11/01/19 1935 11/02/19 1105   11/02/19 0600  piperacillin-tazobactam (ZOSYN) IVPB 3.375 g  Status:  Discontinued        3.375 g 12.5 mL/hr over 240 Minutes Intravenous Every 8 hours 11/02/19 0311 11/03/19 0920   11/01/19 2200  ceFEPIme (MAXIPIME) 2 g in sodium chloride 0.9 % 100 mL IVPB  Status:  Discontinued        2 g 200 mL/hr over 30 Minutes Intravenous Every 12 hours 11/01/19 2143 11/02/19 0311   11/01/19 1915  vancomycin (VANCOREADY) IVPB 1500 mg/300 mL        1,500 mg 150 mL/hr over 120 Minutes Intravenous  Once 11/01/19 1909 11/01/19 2147   11/01/19 1845  cefTRIAXone (ROCEPHIN) 1 g in sodium chloride 0.9 % 100 mL IVPB        1 g 200 mL/hr over 30 Minutes Intravenous  Once 11/01/19 1842 11/01/19 2051   11/01/19 1845  azithromycin (ZITHROMAX) 500 mg in sodium chloride 0.9 % 250 mL IVPB        500 mg 250 mL/hr over 60 Minutes Intravenous  Once 11/01/19 1842 11/01/19  2051      PRN meds: acetaminophen (TYLENOL) oral liquid 160 mg/5 mL, docusate, influenza vac split quadrivalent PF   Objective: Vitals:   11/29/19 1138 11/29/19 1213  BP:    Pulse: 94   Resp: 16   Temp:  98.8 F (37.1 C)  SpO2: 96%     Intake/Output Summary (Last 24 hours) at 11/29/2019 1301 Last data filed at 11/29/2019 0318 Gross per 24 hour  Intake 200 ml  Output 750 ml  Net -550 ml   Filed Weights   11/27/19 0500 11/29/19 0044 11/29/19 0225  Weight: 72.6 kg 73.9 kg 73.9 kg   Weight change:  Body mass index is 26.29 kg/m.   Physical Exam: General exam: Appears calm and comfortable.  Not in physical distress Skin: No rashes, lesions or ulcers. HEENT: Has tracheostomy tube anteriorly. Lungs: Clear to auscultation bilaterally CVS: Regular rate and rhythm, no murmur GI/Abd soft, nontender, nondistended, bowel sound present, PEG tube site intact CNS: Eyes open.  Inconsistent ability to follow some commands. Psychiatry: Unable to examine because of poor mental status Extremities: No pedal edema, no calf tenderness  Data Review: I have personally reviewed the laboratory data and studies available.  Recent Labs  Lab 11/24/19 0026 11/26/19 0053  WBC 4.6 6.3  NEUTROABS 3.0 4.6  HGB 11.8* 12.4*  HCT 37.3* 38.5*  MCV 95.4 94.6  PLT 251 239   Recent Labs  Lab 11/24/19 0026 11/26/19 0053  NA 138 136  K 4.0 4.1  CL 102 100  CO2 25 25  GLUCOSE 121* 128*  BUN 16 13  CREATININE 0.59* 0.75  CALCIUM 9.7 10.0  MG 1.9  --   PHOS 3.5  --     F/u labs ordered  Signed, Terrilee Croak, MD [facility] 11/29/2019

## 2019-11-29 NOTE — Plan of Care (Signed)
Problem: Education: Goal: Knowledge of General Education information will improve Description: Including pain rating scale, medication(s)/side effects and non-pharmacologic comfort measures 11/29/2019 0158 by Gearldine Bienenstock, RN Outcome: Not Applicable 11/29/2019 0158 by Gearldine Bienenstock, RN Outcome: Progressing   Problem: Health Behavior/Discharge Planning: Goal: Ability to manage health-related needs will improve 11/29/2019 0158 by Gearldine Bienenstock, RN Outcome: Not Applicable 11/29/2019 0158 by Gearldine Bienenstock, RN Outcome: Progressing   Problem: Clinical Measurements: Goal: Ability to maintain clinical measurements within normal limits will improve 11/29/2019 0158 by Gearldine Bienenstock, RN Outcome: Not Applicable 11/29/2019 0158 by Gearldine Bienenstock, RN Outcome: Progressing Goal: Will remain free from infection 11/29/2019 0158 by Gearldine Bienenstock, RN Outcome: Not Applicable 11/29/2019 0158 by Gearldine Bienenstock, RN Outcome: Progressing Goal: Diagnostic test results will improve 11/29/2019 0158 by Gearldine Bienenstock, RN Outcome: Not Applicable 11/29/2019 0158 by Gearldine Bienenstock, RN Outcome: Progressing Goal: Respiratory complications will improve 11/29/2019 0158 by Gearldine Bienenstock, RN Outcome: Not Applicable 11/29/2019 0158 by Gearldine Bienenstock, RN Outcome: Progressing Goal: Cardiovascular complication will be avoided 11/29/2019 0158 by Gearldine Bienenstock, RN Outcome: Not Applicable 11/29/2019 0158 by Gearldine Bienenstock, RN Outcome: Progressing   Problem: Activity: Goal: Risk for activity intolerance will decrease 11/29/2019 0158 by Gearldine Bienenstock, RN Outcome: Not Applicable 11/29/2019 0158 by Gearldine Bienenstock, RN Outcome: Progressing   Problem: Nutrition: Goal: Adequate nutrition will be maintained 11/29/2019 0158 by Gearldine Bienenstock, RN Outcome: Not Applicable 11/29/2019 0158 by Gearldine Bienenstock, RN Outcome: Progressing   Problem: Coping: Goal: Level of anxiety will decrease 11/29/2019 0158 by  Gearldine Bienenstock, RN Outcome: Not Applicable 11/29/2019 0158 by Gearldine Bienenstock, RN Outcome: Progressing   Problem: Elimination: Goal: Will not experience complications related to bowel motility 11/29/2019 0158 by Gearldine Bienenstock, RN Outcome: Not Applicable 11/29/2019 0158 by Gearldine Bienenstock, RN Outcome: Progressing Goal: Will not experience complications related to urinary retention 11/29/2019 0158 by Gearldine Bienenstock, RN Outcome: Not Applicable 11/29/2019 0158 by Gearldine Bienenstock, RN Outcome: Progressing   Problem: Pain Managment: Goal: General experience of comfort will improve 11/29/2019 0158 by Gearldine Bienenstock, RN Outcome: Not Applicable 11/29/2019 0158 by Gearldine Bienenstock, RN Outcome: Progressing   Problem: Safety: Goal: Ability to remain free from injury will improve 11/29/2019 0158 by Gearldine Bienenstock, RN Outcome: Not Applicable 11/29/2019 0158 by Gearldine Bienenstock, RN Outcome: Progressing   Problem: Skin Integrity: Goal: Risk for impaired skin integrity will decrease 11/29/2019 0158 by Gearldine Bienenstock, RN Outcome: Not Applicable 11/29/2019 0158 by Gearldine Bienenstock, RN Outcome: Progressing   Problem: Activity: Goal: Ability to tolerate increased activity will improve 11/29/2019 0158 by Gearldine Bienenstock, RN Outcome: Not Applicable 11/29/2019 0158 by Gearldine Bienenstock, RN Outcome: Progressing   Problem: Respiratory: Goal: Ability to maintain a clear airway and adequate ventilation will improve 11/29/2019 0158 by Gearldine Bienenstock, RN Outcome: Not Applicable 11/29/2019 0158 by Gearldine Bienenstock, RN Outcome: Progressing   Problem: Role Relationship: Goal: Method of communication will improve 11/29/2019 0158 by Gearldine Bienenstock, RN Outcome: Not Applicable 11/29/2019 0158 by Gearldine Bienenstock, RN Outcome: Progressing   Problem: Health Behavior/Discharge Planning: Goal: Ability to manage health-related needs will improve 11/29/2019 0158 by Gearldine Bienenstock, RN Outcome: Not  Applicable 11/29/2019 0158 by Gearldine Bienenstock, RN Outcome: Progressing   Problem: Clinical Measurements: Goal: Ability to maintain clinical measurements within normal limits will improve 11/29/2019 0158 by Gearldine Bienenstock, RN Outcome: Not Applicable 11/29/2019 0158 by Gearldine Bienenstock, RN Outcome: Progressing   Problem: Clinical Measurements: Goal: Ability to maintain clinical measurements within normal limits will improve 11/29/2019 0158 by Gearldine Bienenstock, RN Outcome: Not  Applicable   Problem: Clinical Measurements: Goal: Ability to maintain clinical measurements within normal limits will improve 11/29/2019 0158 by Gearldine Bienenstock, RN Outcome: Progressing   Problem: Clinical Measurements: Goal: Will remain free from infection 11/29/2019 0158 by Gearldine Bienenstock, RN Outcome: Not Applicable 11/29/2019 0158 by Gearldine Bienenstock, RN Outcome: Progressing   Problem: Clinical Measurements: Goal: Diagnostic test results will improve 11/29/2019 0158 by Gearldine Bienenstock, RN Outcome: Not Applicable 11/29/2019 0158 by Gearldine Bienenstock, RN Outcome: Progressing   Problem: Clinical Measurements: Goal: Respiratory complications will improve 11/29/2019 0158 by Gearldine Bienenstock, RN Outcome: Not Applicable 11/29/2019 0158 by Gearldine Bienenstock, RN Outcome: Progressing   Problem: Clinical Measurements: Goal: Cardiovascular complication will be avoided 11/29/2019 0158 by Gearldine Bienenstock, RN Outcome: Not Applicable 11/29/2019 0158 by Gearldine Bienenstock, RN Outcome: Progressing   Problem: Activity: Goal: Risk for activity intolerance will decrease 11/29/2019 0158 by Gearldine Bienenstock, RN Outcome: Not Applicable 11/29/2019 0158 by Gearldine Bienenstock, RN Outcome: Progressing   Problem: Nutrition: Goal: Adequate nutrition will be maintained 11/29/2019 0158 by Gearldine Bienenstock, RN Outcome: Not Applicable 11/29/2019 0158 by Gearldine Bienenstock, RN Outcome: Progressing   Problem: Coping: Goal: Level of anxiety will  decrease 11/29/2019 0158 by Gearldine Bienenstock, RN Outcome: Not Applicable 11/29/2019 0158 by Gearldine Bienenstock, RN Outcome: Progressing   Problem: Elimination: Goal: Will not experience complications related to bowel motility 11/29/2019 0158 by Gearldine Bienenstock, RN Outcome: Not Applicable 11/29/2019 0158 by Gearldine Bienenstock, RN Outcome: Progressing   Problem: Elimination: Goal: Will not experience complications related to urinary retention 11/29/2019 0158 by Gearldine Bienenstock, RN Outcome: Not Applicable 11/29/2019 0158 by Gearldine Bienenstock, RN Outcome: Progressing   Problem: Pain Managment: Goal: General experience of comfort will improve 11/29/2019 0158 by Gearldine Bienenstock, RN Outcome: Not Applicable 11/29/2019 0158 by Gearldine Bienenstock, RN Outcome: Progressing   Problem: Safety: Goal: Ability to remain free from injury will improve 11/29/2019 0158 by Gearldine Bienenstock, RN Outcome: Not Applicable 11/29/2019 0158 by Gearldine Bienenstock, RN Outcome: Progressing   Problem: Skin Integrity: Goal: Risk for impaired skin integrity will decrease 11/29/2019 0158 by Gearldine Bienenstock, RN Outcome: Not Applicable 11/29/2019 0158 by Gearldine Bienenstock, RN Outcome: Progressing   Problem: Activity: Goal: Ability to tolerate increased activity will improve 11/29/2019 0158 by Gearldine Bienenstock, RN Outcome: Not Applicable 11/29/2019 0158 by Gearldine Bienenstock, RN Outcome: Progressing   Problem: Respiratory: Goal: Ability to maintain a clear airway and adequate ventilation will improve 11/29/2019 0158 by Gearldine Bienenstock, RN Outcome: Not Applicable 11/29/2019 0158 by Gearldine Bienenstock, RN Outcome: Progressing   Problem: Role Relationship: Goal: Method of communication will improve 11/29/2019 0158 by Gearldine Bienenstock, RN Outcome: Not Applicable 11/29/2019 0158 by Gearldine Bienenstock, RN Outcome: Progressing

## 2019-11-29 NOTE — Progress Notes (Signed)
Received patient for care around 1900 from off-going nurse. MAR, chart, and labs reviewed. Patient alert, non-verbal, unable to follow commands. Opens eyes spontaneously. Skin assessed with no new skin issues noted. Patient turned and repositioned every 2 hours and as needed with 1 assist. Patient incontinent x2. Condom catheter in place. Patient had bowel movement x2 this shift. Moisture barrier cream applied to sacrum/perineum. Vitals stable.  bed in low position, wheels locked. Unable to utilize call bell. Frequent safety and rounding checks completed. No acute events this shift. Will continue to monitor for signs and symptoms of deterioration and report as needed.

## 2019-11-29 NOTE — TOC Progression Note (Addendum)
Transition of Care [facility]) - Progression Note    Patient Details  Name: Makoto Sellitto MRN: [id number] Date of Birth: 1965/10/31  [facility] CM/SW Contact  Beckie Busing, RN Phone Number: [phone]  11/29/2019, 2:56 PM  Clinical Narrative:    CM received message that patients daughter is requesting to speak with CM about her fathers disability claim. CM called daughter Thurmond Butts. Daughter states that she does not know who to follow up with to determine the status of the medicaid/ disability claim. CM attempted to call the financial counselor that initially assisted with the claim. [phone] message has been left. Daughter is also requesting that MD sign Health Proxy forms in order to grant daughter access to my chart. Forms have been emailed to CM. CM to notify MD for signature.   1600 MD signed Healthcare Proxy form and form has been emailed to daughter. Daughter confirms that she has received forms.   Expected Discharge Plan: Long Term Acute Care (LTAC) Barriers to Discharge: Continued Medical Work up  Expected Discharge Plan and Services Expected Discharge Plan: Long Term Acute Care (LTAC)   Discharge Planning Services: CM Consult   Living arrangements for the past 2 months: Single Family Home                                       Social Determinants of Health (SDOH) Interventions    Readmission Risk Interventions No flowsheet data found.

## 2019-11-29 NOTE — Plan of Care (Signed)

## 2019-11-30 LAB — GLUCOSE, CAPILLARY
Glucose-Capillary: 122 mg/dL — ABNORMAL HIGH (ref 70–99)
Glucose-Capillary: 107 mg/dL — ABNORMAL HIGH (ref 70–99)
Glucose-Capillary: 147 mg/dL — ABNORMAL HIGH (ref 70–99)
Glucose-Capillary: 113 mg/dL — ABNORMAL HIGH (ref 70–99)
Glucose-Capillary: 103 mg/dL — ABNORMAL HIGH (ref 70–99)
Glucose-Capillary: 124 mg/dL — ABNORMAL HIGH (ref 70–99)

## 2019-11-30 NOTE — Plan of Care (Signed)

## 2019-11-30 NOTE — Progress Notes (Signed)
f Speech Language Pathology Treatment: [name] Speaking valve;Cognitive-Linquistic  Patient Details Name: Ryan Orozco MRN: [id number] DOB: 02-Oct-1965 Today's Date: 11/30/2019 Time: 4174-0814 SLP Time Calculation (min) (ACUTE ONLY): 15 min  Assessment / Plan / Recommendation Clinical Impression  Tyreck seen with PMV and to facilitate his cognitive abilities. Mild improvement today from previous session with increased frequency of eye contact and sustained slightly longer. Turned head to auditory stimuli (name called) upon entrance and turned head to right and left as therapist stood on each side of his bed. Needed total assist for hand over hand teeth brushing (no assist from pt); held in hand for 30 seconds.  He tolerated valve for 10 minutes today and increased spontaneous vocalizations (vowels) with work of breathing stable and no air trapping. Audible glottic secretions not mobilized with strategic placement and removal of valve. ST will continue working with Leiby who demonstrates generalized responses (anoxic, not TBI).     HPI HPI: 54 y/o male with hx of HIV, ETOH abuse found down outside a convenience store on 9/27, received out of hospital CPR. Presents with acute hypoxic respiratory failure s/p cardiac arrest, acute encephalopathy due to hypoxic brain injury. Intubated 9/27-10/13. Received a tracheostomy 10/13, treachestomy collar trial for 9 hours on 10/16. CXR on 10/17 improving and shows left lung opacities have essentially resolved.      SLP Plan  Continue with current plan of care (downgrade 1x/week)       Recommendations         Patient may use Passy-Muir Speech Valve: with SLP only PMSV Supervision: Full         Oral Care Recommendations: Oral care QID Follow up Recommendations: LTACH;Skilled Nursing facility SLP Visit Diagnosis: Aphonia (R49.1);Cognitive communication deficit [phone]) Plan: Continue with current plan of care (downgrade 1x/week)                       Royce Macadamia 11/30/2019, 4:39 PM [name].Ed Nurse, children's [phone] Office [phone]

## 2019-11-30 NOTE — Progress Notes (Signed)
Received patient for care around 1900 from off-going nurse. MAR, chart, and labs reviewed. Patient alert, non-verbal, unable to follow commands. Opens eyes spontaneously. Patient's oxygen saturation remained above 2% on trach collar 5L. Noted frequent large amounts of productive thick, white/yellow secretions from trach. Frequent suctioning provided. Oral care performed every 2 hours. Skin assessed with no new skin issues noted. Patient turned and repositioned every 2 hours and as needed with 1 assist. Patient incontinent x2. Condom catheter in place.  Moisture barrier cream applied to sacrum/perineum. Vitals stable.  bed in low position, wheels locked. Unable to utilize call bell. Frequent safety and rounding checks completed. No acute events this shift. Will continue to monitor for signs and symptoms of deterioration and report as needed.

## 2019-11-30 NOTE — Plan of Care (Signed)
  Problem: Education: Goal: Knowledge of General Education information will improve Description: Including pain rating scale, medication(s)/side effects and non-pharmacologic comfort measures Outcome: Progressing   Problem: Health Behavior/Discharge Planning: Goal: Ability to manage health-related needs will improve Outcome: Progressing   Problem: Clinical Measurements: Goal: Ability to maintain clinical measurements within normal limits will improve Outcome: Progressing Goal: Will remain free from infection Outcome: Progressing Goal: Diagnostic test results will improve Outcome: Progressing Goal: Respiratory complications will improve Outcome: Progressing Goal: Cardiovascular complication will be avoided Outcome: Progressing   Problem: Activity: Goal: Risk for activity intolerance will decrease Outcome: Progressing   Problem: Nutrition: Goal: Adequate nutrition will be maintained Outcome: Progressing   Problem: Coping: Goal: Level of anxiety will decrease Outcome: Progressing   Problem: Elimination: Goal: Will not experience complications related to bowel motility Outcome: Progressing Goal: Will not experience complications related to urinary retention Outcome: Progressing   Problem: Pain Managment: Goal: General experience of comfort will improve Outcome: Progressing   Problem: Safety: Goal: Ability to remain free from injury will improve Outcome: Progressing   Problem: Skin Integrity: Goal: Risk for impaired skin integrity will decrease Outcome: Progressing   Problem: Education: Goal: Knowledge of General Education information will improve Description: Including pain rating scale, medication(s)/side effects and non-pharmacologic comfort measures Outcome: Progressing   Problem: Health Behavior/Discharge Planning: Goal: Ability to manage health-related needs will improve Outcome: Progressing   Problem: Clinical Measurements: Goal: Ability to maintain  clinical measurements within normal limits will improve Outcome: Progressing   Problem: Clinical Measurements: Goal: Will remain free from infection Outcome: Progressing   Problem: Clinical Measurements: Goal: Diagnostic test results will improve Outcome: Progressing   Problem: Clinical Measurements: Goal: Respiratory complications will improve Outcome: Progressing   Problem: Clinical Measurements: Goal: Cardiovascular complication will be avoided Outcome: Progressing   Problem: Activity: Goal: Risk for activity intolerance will decrease Outcome: Progressing   Problem: Nutrition: Goal: Adequate nutrition will be maintained Outcome: Progressing   Problem: Coping: Goal: Level of anxiety will decrease Outcome: Progressing   Problem: Elimination: Goal: Will not experience complications related to bowel motility Outcome: Progressing   Problem: Elimination: Goal: Will not experience complications related to urinary retention Outcome: Progressing   Problem: Pain Managment: Goal: General experience of comfort will improve Outcome: Progressing   Problem: Safety: Goal: Ability to remain free from injury will improve Outcome: Progressing   Problem: Skin Integrity: Goal: Risk for impaired skin integrity will decrease Outcome: Progressing

## 2019-11-30 NOTE — Progress Notes (Signed)
PROGRESS NOTE  Ryan Orozco  DOB: [birth year]  PCP: Default, Provider, MD VOJ:[id number]  DOA: 11/01/2019  LOS: 29 days   Chief Complaint  Patient presents with  . Cardiac Arrest   Brief narrative: Ryan Orozco is a 54 y.o. male with PMH of HIV (dx 1995) and chronic alcohol abuse. Patient was brought to the ED by police on 9/93/7169 after events noted down outside a convenience store. EMS noted him pulseless with PEA.  Unknown downtime prior to EMS arrival.  EMS performed 2 rounds of CPR with 2 epi before ROSC, intubated en-route and brought to the ED.  In the ED, lactate was elevated to 4.4 post CPR, troponin IV 150, electrolytes within normal limit, blood alcohol level alcohol level 177.  UDS positive for THC, benzos. CT scan of head did not show any acute intracranial abnormality but showed moderate generalized cerebral atrophy advanced for age. He was admitted to ICU. Identified to have anoxic brain injury.  He failed extubation trials. 10/13, he received a tracheostomy.  He was gradually weaned off vent to trach collar. 10/15, IR inserted G-tube. 10/21, transferred out to hospitalist service.  Patient still has significant neurological deficits.  He inconsistently follows motor commands. Unable to have any conversation.  Subjective: Patient was seen and examined this morning. No change in status. Vital signs remains stable.  Assessment/Plan: Cardiac arrest-prolonged downtime Anoxic brain injury Myoclonic seizures -Due to prolonged downtime after cardiac arrest. -ROSC achieved but patient might have had significant anoxic brain injury by then. -Neurology consultation was obtained. -EEG from 9/29 showed generalized polyspike's consistent with myoclonic seizures.  Patient was started on Keppra. -MRI from 10/3, 10/11 and repeat EEG from 10/8 all suggestive of severe diffuse encephalopathy likely related to anoxic/hypoxic brain injury. -Per neurology note, "it is  likely that patient will continue to have more neurologic recovery and possibly improvement in speech", though there has been little progression in his physical exam so uncertain if he will improve to a meaningful degree -On serial examinations, patient has been noted to move his toes on command.  But otherwise no meaningful change in mental status.  Acute hypoxemic respiratory failure Tracheostomy and PEG dependence  -Again secondary to anoxic brain injury -Failed extubation trials.  Tracheostomy on 10/13.  Currently on 5 L oxygen by trach collar.  Wean down as tolerated. -Pulmonary following for tracheostomy care.  Currently has cuffless size 6 tube. -Target oxygen saturation more than 90%. -10/15, IR inserted PEG tube.  Continue tube feeding. -SLP to continue to work w/ PMV  Essential hypertension -Currently on Norvasc. Continue to monitor blood pressure.  Bilateral inguinal hernia Left hydrocele -Testicular US 10/10>showed large left hydrocele and left inguinal hernia. No evidence of testicular mass. -CT abdomen pelvis 10/12: Right inguinal hernia and partial small bowel obstruction, large left inguinal hernia -Patient has a Flexi-Seal. Seems to have soft stool. Okay to remove Flexi-Seal.  Protein calorie malnutrition -Nutrition consulted.  Note as below. -Continue tube feeding.  Chronic alcohol abuse abuse -Thiamine and folate.  HIV/AIDS -Cont Bactrim, Tivicay and Descovy  Mobility: Needs to work with PT once mental status improves Code Status:   Code Status: Full Code.  Palliative care consult appreciated Nutritional status: Body mass index is 26.71 kg/m. Nutrition Problem: Increased nutrient needs Etiology: acute illness Signs/Symptoms: estimated needs Diet Order            Diet NPO time specified  Diet effective midnight  DVT prophylaxis: heparin injection 5,000 Units Start: 11/16/19 2200 SCDs Start: 11/01/19 2113   Antimicrobials:  HIV  meds Fluid: None Consultants: Critical care, neurology Family Communication:  Discussed with patient daughter at bedside on 10/23.  Status is: Inpatient  Remains inpatient appropriate because:Unsafe d/c plan   Dispo: The patient is from: Home              Anticipated d/c is to: SNF              Anticipated d/c date is: > 3 days              Patient currently is not medically stable to d/c.       Infusions:  . sodium chloride 10 mL/hr at 11/29/19 0307  . feeding supplement (OSMOLITE 1.5 CAL) 60 mL/hr at 11/29/19 2132    Scheduled Meds: . amLODipine  10 mg Per Tube Daily  . chlorhexidine gluconate (MEDLINE KIT)  15 mL Mouth Rinse BID  . Chlorhexidine Gluconate Cloth  6 each Topical Q0600  . dolutegravir  50 mg Oral Daily  . emtricitabine-tenofovir AF  1 tablet Per Tube Daily  . feeding supplement (PROSource TF)  45 mL Per Tube TID  . folic acid  1 mg Per Tube Daily  . heparin injection (subcutaneous)  5,000 Units Subcutaneous Q8H  . levETIRAcetam  1,500 mg Per Tube BID  . mouth rinse  15 mL Mouth Rinse 10 times per day  . polyethylene glycol  17 g Per Tube Daily  . scopolamine  1 patch Transdermal Q72H  . sulfamethoxazole-trimethoprim  1 tablet Per Tube Once per day on Mon Wed Fri  . thiamine  100 mg Per Tube Daily    Antimicrobials: Anti-infectives (From admission, onward)   Start     Dose/Rate Route Frequency Ordered Stop   11/17/19 1548  ceFAZolin (ANCEF) 2-4 GM/100ML-% IVPB       Note to Pharmacy: Domenick Bookbinder   : cabinet override      11/17/19 1548 11/18/19 0359   11/16/19 1515  ceFAZolin (ANCEF) IVPB 2g/100 mL premix        2 g 200 mL/hr over 30 Minutes Intravenous To Radiology 11/16/19 1511 11/17/19 1625   11/15/19 0900  sulfamethoxazole-trimethoprim (BACTRIM DS) 800-160 MG per tablet 1 tablet        1 tablet Per Tube Once per day on Mon Wed Fri 11/13/19 1906     11/13/19 1615  dolutegravir (TIVICAY) tablet 50 mg        50 mg Oral Daily 11/13/19 1521      11/13/19 1615  emtricitabine-tenofovir AF (DESCOVY) 200-25 MG per tablet 1 tablet        1 tablet Per Tube Daily 11/13/19 1521     11/13/19 1530  sulfamethoxazole-trimethoprim (BACTRIM DS) 800-160 MG per tablet 1 tablet  Status:  Discontinued        1 tablet Oral Once per day on Mon Wed Fri 11/13/19 1441 11/13/19 1906   11/13/19 1500  bictegravir-emtricitabine-tenofovir AF (BIKTARVY) 50-200-25 MG per tablet 1 tablet  Status:  Discontinued        1 tablet Oral Daily 11/13/19 1403 11/13/19 1521   11/10/19 1000  erythromycin 250 mg in sodium chloride 0.9 % 100 mL IVPB        250 mg 100 mL/hr over 60 Minutes Intravenous Every 8 hours 11/10/19 0907 11/11/19 2000   11/07/19 1200  ceFEPIme (MAXIPIME) 2 g in sodium chloride 0.9 % 100 mL IVPB  2 g 200 mL/hr over 30 Minutes Intravenous Every 8 hours 11/07/19 1013 11/13/19 2127   11/03/19 1200  cefTRIAXone (ROCEPHIN) 2 g in sodium chloride 0.9 % 100 mL IVPB        2 g 200 mL/hr over 30 Minutes Intravenous Every 24 hours 11/03/19 0922 11/05/19 1427   11/02/19 0800  vancomycin (VANCOREADY) IVPB 750 mg/150 mL  Status:  Discontinued        750 mg 150 mL/hr over 60 Minutes Intravenous Every 12 hours 11/01/19 1935 11/02/19 1105   11/02/19 0600  piperacillin-tazobactam (ZOSYN) IVPB 3.375 g  Status:  Discontinued        3.375 g 12.5 mL/hr over 240 Minutes Intravenous Every 8 hours 11/02/19 0311 11/03/19 0920   11/01/19 2200  ceFEPIme (MAXIPIME) 2 g in sodium chloride 0.9 % 100 mL IVPB  Status:  Discontinued        2 g 200 mL/hr over 30 Minutes Intravenous Every 12 hours 11/01/19 2143 11/02/19 0311   11/01/19 1915  vancomycin (VANCOREADY) IVPB 1500 mg/300 mL        1,500 mg 150 mL/hr over 120 Minutes Intravenous  Once 11/01/19 1909 11/01/19 2147   11/01/19 1845  cefTRIAXone (ROCEPHIN) 1 g in sodium chloride 0.9 % 100 mL IVPB        1 g 200 mL/hr over 30 Minutes Intravenous  Once 11/01/19 1842 11/01/19 2051   11/01/19 1845  azithromycin (ZITHROMAX)  500 mg in sodium chloride 0.9 % 250 mL IVPB        500 mg 250 mL/hr over 60 Minutes Intravenous  Once 11/01/19 1842 11/01/19 2051      PRN meds: acetaminophen (TYLENOL) oral liquid 160 mg/5 mL, docusate, influenza vac split quadrivalent PF   Objective: Vitals:   11/30/19 0758 11/30/19 1145  BP: 111/81   Pulse: 96 82  Resp: 20 18  Temp:    SpO2: 97% 98%    Intake/Output Summary (Last 24 hours) at 11/30/2019 1209 Last data filed at 11/30/2019 0529 Gross per 24 hour  Intake 813 ml  Output 720 ml  Net 93 ml   Filed Weights   11/29/19 0225 11/29/19 2214 11/30/19 0101  Weight: 73.9 kg 75.1 kg 75.1 kg   Weight change: 1.179 kg Body mass index is 26.71 kg/m.   Physical Exam: General exam: Appears calm and comfortable.  Not in physical distress Skin: No rashes, lesions or ulcers. HEENT: Has tracheostomy tube anteriorly. Lungs: Clear to auscultation bilaterally CVS: Regular rate and rhythm, no murmur GI/Abd soft, nontender, nondistended, bowel sound present, PEG tube site intact CNS: Eyes open.  Inconsistent ability to follow some commands. Psychiatry: Unable to examine because of poor mental status Extremities: No pedal edema, no calf tenderness  Data Review: I have personally reviewed the laboratory data and studies available.  Recent Labs  Lab 11/24/19 0026 11/26/19 0053  WBC 4.6 6.3  NEUTROABS 3.0 4.6  HGB 11.8* 12.4*  HCT 37.3* 38.5*  MCV 95.4 94.6  PLT 251 239   Recent Labs  Lab 11/24/19 0026 11/26/19 0053  NA 138 136  K 4.0 4.1  CL 102 100  CO2 25 25  GLUCOSE 121* 128*  BUN 16 13  CREATININE 0.59* 0.75  CALCIUM 9.7 10.0  MG 1.9  --   PHOS 3.5  --     F/u labs ordered  Signed, Terrilee Croak, MD [facility] 11/30/2019

## 2019-12-01 ENCOUNTER — Other Ambulatory Visit: Payer: Self-pay

## 2019-12-01 LAB — GLUCOSE, CAPILLARY
Glucose-Capillary: 97 mg/dL (ref 70–99)
Glucose-Capillary: 101 mg/dL — ABNORMAL HIGH (ref 70–99)
Glucose-Capillary: 118 mg/dL — ABNORMAL HIGH (ref 70–99)
Glucose-Capillary: 105 mg/dL — ABNORMAL HIGH (ref 70–99)
Glucose-Capillary: 139 mg/dL — ABNORMAL HIGH (ref 70–99)
Glucose-Capillary: 118 mg/dL — ABNORMAL HIGH (ref 70–99)

## 2019-12-01 NOTE — Plan of Care (Signed)

## 2019-12-01 NOTE — Progress Notes (Signed)
Received patient for care around 1900 from off-going nurse. MAR, chart, and labs reviewed. Patient alert, non-verbal, unable to follow commands. Opens eyes spontaneously.Patient's oxygen saturation remained above 2% on trach collar 5L/28%. Noted decrease in previous frequent large amounts of productive thick, white/yellow secretions from trach.  Oral care performed every 2 hours. Skin assessed with no new skin issues noted. Patient turned and repositioned every 2 hours and as needed with 1assist. Patient incontinent x2.Condom catheter in place.  Moisture barrier cream applied to sacrum/perineum. Vitals stable.bed in low position, wheels locked. Unable to utilize call bell. Frequent safety and roundingcheckscompleted.No acute events this shift. Will continue to monitor for signs and symptoms of deterioration and report as needed.

## 2019-12-01 NOTE — Progress Notes (Signed)
PROGRESS NOTE  Ryan Orozco  DOB: 12/22/1965  PCP: Default, Provider, MD KZS:[id number]  DOA: 11/01/2019  LOS: 30 days   Chief Complaint  Patient presents with  . Cardiac Arrest   Brief narrative: Ryan Orozco is a 54 y.o. male with PMH of HIV (dx 1995) and chronic alcohol abuse. Patient was brought to the ED by police on 7/32/2025 after events noted down outside a convenience store. EMS noted him pulseless with PEA.  Unknown downtime Successfully resuscitated and brought to ED.  Initial work-up showed UDS positive for THC/benzos, CT scan of head without any significant acute intracranial abnormality. He was admitted to ICU. He was identified to have anoxic brain injury.  He failed extubation trials. 10/13, he received a tracheostomy.  He was gradually weaned off vent to trach collar. 10/15, IR inserted G-tube. 10/21, transferred out to hospitalist service.  At this time, patient is medically stable but has no place to be discharged to. Patient still has significant neurological deficits.  He inconsistently follows motor commands. Unable to have any conversation.  Subjective: Patient was seen and examined this morning. No change in status. Vital signs remains stable.  Assessment/Plan: Cardiac arrest-prolonged downtime Anoxic brain injury Myoclonic seizures -Successfully resuscitated by EMS but because of unknown prolonged downtime, patient had significant anoxic brain injury.  -EEG from 9/29 showed generalized polyspike's consistent with myoclonic seizures.  Patient was started on Keppra. -MRI from 10/3, 10/11 and repeat EEG from 10/8 all suggestive of severe diffuse encephalopathy likely related to anoxic/hypoxic brain injury. -Per neurology note, "it is likely that patient will continue to have more neurologic recovery and possibly improvement in speech", though there has been little progression in his physical exam so uncertain if he will improve to a meaningful  degree -On serial examinations, patient has been noted to move his toes on command.  But otherwise no meaningful change in mental status.  Acute hypoxemic respiratory failure Tracheostomy and PEG dependence  -Failed extubation trials.  Tracheostomy on 10/13. Currently has cuffless size 6 tube.  On 5 L oxygen by trach collar. Wean down as tolerated.  -Pulmonary following for tracheostomy care.   -10/15, IR inserted PEG tube.  Continue tube feeding. -SLP to continue to work w/ PMV  Essential hypertension -Currently on Norvasc.  Blood pressure stable.  Continue to monitor  Bilateral inguinal hernia Left hydrocele -Testicular US 10/10 and CT abdomen 10/12>showed large left hydrocele and left inguinal hernia. No evidence of testicular mass. -Outpatient follow-up if patient gets meaningful neurological recovery.  Protein calorie malnutrition -Nutrition consulted.  Note as below. -Continue tube feeding.  Chronic alcohol abuse abuse -Thiamine and folate.  HIV/AIDS -Cont Bactrim, Tivicay and Descovy  Mobility: Spontaneous movement of extremities seen but follows motor commands inconsistently.   Code Status:   Code Status: Full Code.  Palliative care consult appreciated Nutritional status: Body mass index is 26.71 kg/m. Nutrition Problem: Increased nutrient needs Etiology: acute illness Signs/Symptoms: estimated needs Diet Order            Diet NPO time specified  Diet effective midnight                 DVT prophylaxis: heparin injection 5,000 Units Start: 11/16/19 2200 SCDs Start: 11/01/19 2113   Antimicrobials:  HIV meds Fluid: None Consultants: Critical care, neurology Family Communication:  Discussed with patient daughter at bedside on 10/23.  Status is: Inpatient  Remains inpatient appropriate because:Unsafe d/c plan.  No place to go   Dispo: The patient is  from: Home              Anticipated d/c is to: SNF              Anticipated d/c date is: > 3 days               Patient currently is medically stable to d/c.  Infusions:  . sodium chloride 10 mL/hr at 12/01/19 0511  . feeding supplement (OSMOLITE 1.5 CAL) 1,000 mL (11/30/19 1725)    Scheduled Meds: . amLODipine  10 mg Per Tube Daily  . chlorhexidine gluconate (MEDLINE KIT)  15 mL Mouth Rinse BID  . Chlorhexidine Gluconate Cloth  6 each Topical Q0600  . dolutegravir  50 mg Oral Daily  . emtricitabine-tenofovir AF  1 tablet Per Tube Daily  . feeding supplement (PROSource TF)  45 mL Per Tube TID  . folic acid  1 mg Per Tube Daily  . heparin injection (subcutaneous)  5,000 Units Subcutaneous Q8H  . levETIRAcetam  1,500 mg Per Tube BID  . mouth rinse  15 mL Mouth Rinse 10 times per day  . polyethylene glycol  17 g Per Tube Daily  . scopolamine  1 patch Transdermal Q72H  . sulfamethoxazole-trimethoprim  1 tablet Per Tube Once per day on Mon Wed Fri  . thiamine  100 mg Per Tube Daily    Antimicrobials: Anti-infectives (From admission, onward)   Start     Dose/Rate Route Frequency Ordered Stop   11/17/19 1548  ceFAZolin (ANCEF) 2-4 GM/100ML-% IVPB       Note to Pharmacy: Domenick Bookbinder   : cabinet override      11/17/19 1548 11/18/19 0359   11/16/19 1515  ceFAZolin (ANCEF) IVPB 2g/100 mL premix        2 g 200 mL/hr over 30 Minutes Intravenous To Radiology 11/16/19 1511 11/17/19 1625   11/15/19 0900  sulfamethoxazole-trimethoprim (BACTRIM DS) 800-160 MG per tablet 1 tablet        1 tablet Per Tube Once per day on Mon Wed Fri 11/13/19 1906     11/13/19 1615  dolutegravir (TIVICAY) tablet 50 mg        50 mg Oral Daily 11/13/19 1521     11/13/19 1615  emtricitabine-tenofovir AF (DESCOVY) 200-25 MG per tablet 1 tablet        1 tablet Per Tube Daily 11/13/19 1521     11/13/19 1530  sulfamethoxazole-trimethoprim (BACTRIM DS) 800-160 MG per tablet 1 tablet  Status:  Discontinued        1 tablet Oral Once per day on Mon Wed Fri 11/13/19 1441 11/13/19 1906   11/13/19 1500   bictegravir-emtricitabine-tenofovir AF (BIKTARVY) 50-200-25 MG per tablet 1 tablet  Status:  Discontinued        1 tablet Oral Daily 11/13/19 1403 11/13/19 1521   11/10/19 1000  erythromycin 250 mg in sodium chloride 0.9 % 100 mL IVPB        250 mg 100 mL/hr over 60 Minutes Intravenous Every 8 hours 11/10/19 0907 11/11/19 2000   11/07/19 1200  ceFEPIme (MAXIPIME) 2 g in sodium chloride 0.9 % 100 mL IVPB        2 g 200 mL/hr over 30 Minutes Intravenous Every 8 hours 11/07/19 1013 11/13/19 2127   11/03/19 1200  cefTRIAXone (ROCEPHIN) 2 g in sodium chloride 0.9 % 100 mL IVPB        2 g 200 mL/hr over 30 Minutes Intravenous Every 24 hours 11/03/19 0922 11/05/19 1427  11/02/19 0800  vancomycin (VANCOREADY) IVPB 750 mg/150 mL  Status:  Discontinued        750 mg 150 mL/hr over 60 Minutes Intravenous Every 12 hours 11/01/19 1935 11/02/19 1105   11/02/19 0600  piperacillin-tazobactam (ZOSYN) IVPB 3.375 g  Status:  Discontinued        3.375 g 12.5 mL/hr over 240 Minutes Intravenous Every 8 hours 11/02/19 0311 11/03/19 0920   11/01/19 2200  ceFEPIme (MAXIPIME) 2 g in sodium chloride 0.9 % 100 mL IVPB  Status:  Discontinued        2 g 200 mL/hr over 30 Minutes Intravenous Every 12 hours 11/01/19 2143 11/02/19 0311   11/01/19 1915  vancomycin (VANCOREADY) IVPB 1500 mg/300 mL        1,500 mg 150 mL/hr over 120 Minutes Intravenous  Once 11/01/19 1909 11/01/19 2147   11/01/19 1845  cefTRIAXone (ROCEPHIN) 1 g in sodium chloride 0.9 % 100 mL IVPB        1 g 200 mL/hr over 30 Minutes Intravenous  Once 11/01/19 1842 11/01/19 2051   11/01/19 1845  azithromycin (ZITHROMAX) 500 mg in sodium chloride 0.9 % 250 mL IVPB        500 mg 250 mL/hr over 60 Minutes Intravenous  Once 11/01/19 1842 11/01/19 2051      PRN meds: acetaminophen (TYLENOL) oral liquid 160 mg/5 mL, docusate, influenza vac split quadrivalent PF   Objective: Vitals:   12/01/19 0730 12/01/19 0825  BP:    Pulse:  (!) 105  Resp:  (!) 22   Temp: 98.8 F (37.1 C)   SpO2:  97%    Intake/Output Summary (Last 24 hours) at 12/01/2019 0939 Last data filed at 12/01/2019 0500 Gross per 24 hour  Intake 1610.95 ml  Output 1200 ml  Net 410.95 ml   Filed Weights   11/29/19 0225 11/29/19 2214 11/30/19 0101  Weight: 73.9 kg 75.1 kg 75.1 kg   Weight change:  Body mass index is 26.71 kg/m.   Physical Exam: General exam: Appears calm and comfortable.  Not in physical distress Skin: No rashes, lesions or ulcers. HEENT: Has tracheostomy tube anteriorly. Lungs: Clear to auscultation bilaterally CVS: Regular rate and rhythm, no murmur GI/Abd soft, nontender, nondistended, bowel sound present, PEG tube site intact CNS: Eyes open.  Inconsistent ability to follow some commands. Psychiatry: Unable to examine because of poor mental status Extremities: No pedal edema, no calf tenderness  Data Review: I have personally reviewed the laboratory data and studies available.  Recent Labs  Lab 11/26/19 0053  WBC 6.3  NEUTROABS 4.6  HGB 12.4*  HCT 38.5*  MCV 94.6  PLT 239   Recent Labs  Lab 11/26/19 0053  NA 136  K 4.1  CL 100  CO2 25  GLUCOSE 128*  BUN 13  CREATININE 0.75  CALCIUM 10.0    F/u labs ordered  Signed, Terrilee Croak, MD [facility] 12/01/2019

## 2019-12-01 NOTE — TOC Progression Note (Signed)
Transition of Care [facility]) - Progression Note    Patient Details  Name: Ryan Orozco MRN: [id number] Date of Birth: 08/24/1965  [facility] CM/SW Contact  Beckie Busing, RN Phone Number: [phone]  12/01/2019, 8:58 AM  Clinical Narrative:    CM contacted Cathlean Marse[place] with [employer]/ Financial counseling to determine the status of patients medicaid application. Tiana  Informed CM that she was still waiting on a letter from the MD to support the patients inability to sign letters amd make decisions. MD notified and letter has been entered in chart. Letter has been securely emailed to [place]. CM will continue to follow.      Expected Discharge Plan: Long Term Acute Care (LTAC) Barriers to Discharge: Continued Medical Work up  Expected Discharge Plan and Services Expected Discharge Plan: Long Term Acute Care (LTAC)   Discharge Planning Services: CM Consult   Living arrangements for the past 2 months: Single Family Home                                       Social Determinants of Health (SDOH) Interventions    Readmission Risk Interventions No flowsheet data found.

## 2019-12-02 LAB — GLUCOSE, CAPILLARY
Glucose-Capillary: 104 mg/dL — ABNORMAL HIGH (ref 70–99)
Glucose-Capillary: 104 mg/dL — ABNORMAL HIGH (ref 70–99)
Glucose-Capillary: 105 mg/dL — ABNORMAL HIGH (ref 70–99)
Glucose-Capillary: 109 mg/dL — ABNORMAL HIGH (ref 70–99)
Glucose-Capillary: 110 mg/dL — ABNORMAL HIGH (ref 70–99)
Glucose-Capillary: 120 mg/dL — ABNORMAL HIGH (ref 70–99)
Glucose-Capillary: 94 mg/dL (ref 70–99)

## 2019-12-02 NOTE — Progress Notes (Signed)
PROGRESS NOTE    Ryan Orozco  TKZ:[id number] DOB: [birth year] DOA: 11/01/2019 PCP: Default, Provider, MD   Chief complaint.  Altered mental status. Brief Narrative:   Ryan Orozco is a 54 y.o. male with PMH of HIV (dx 1995) and chronic alcohol abuse. Patient was brought to the ED by police on 5/73/2202 after events noted down outside a convenience store. EMS noted him pulseless with PEA.  Unknown downtime Successfully resuscitated and brought to ED.  Initial work-up showed UDS positive for THC/benzos, CT scan of head without any significant acute intracranial abnormality. He was admitted to ICU. He was identified to have anoxic brain injury.  He failed extubation trials. 10/13, he received a tracheostomy.  He was gradually weaned off vent to trach collar. 10/15, IR inserted G-tube. 10/21, transferred out to hospitalist service.  Assessment & Plan:   Active Problems:   Cardiac arrest [facility])   Community acquired pneumonia of left upper lobe of lung   Hypoxic encephalopathy (HCC)   Alcohol abuse   Acute respiratory failure with hypoxia (HCC)   Vomiting   Pressure injury of skin   Small bowel obstruction (HCC)   Palliative care encounter   Bowel obstruction (HCC)   Chronic respiratory failure ([place])  #1.  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 Patient had a cardiac arrest before admission to the hospital. Currently, patient has not been able to talk, he follows verbal commands by opening eyes and moving his arms. Patient did not have additional seizures for the last few days. Continue current treatment recommended by neurology.  2.  Acute hypoxemic respiratory failure Status post tracheostomy and PEG tube placement. Still on 5 L oxygen over trach collar. Patient also has significant airway secretion need suctioning. Continue oxygen treatment.  #3.  Essential hypertension. Continue Norvasc.  #4.  HIV/AIDS. Continue Bactrim and  antiretroviral treatment.   DVT prophylaxis: Heparin Code Status: Full Family Communication: None Disposition Plan:  .   Status is: Inpatient  Remains inpatient appropriate because:Unsafe d/c plan   Dispo: The patient is from: Home              Anticipated d/c is to: Unsure              Anticipated d/c date is: ?              Patient currently is medically stable to d/c.        I/O last 3 completed shifts: In: 1874.3 [I.V.:287.3; Other:290; RK/YH:0623] Out: 7628 [BTDVV:6160] No intake/output data recorded.     Consultants:   None  Procedures: Tracheostomy  Antimicrobials: None  Subjective: Patient only responding to verbal commands by moving his arm and opening eyes. Still nonverbal. Has significant airway secretion, on 5 L oxygen. Does not seem to have abdominal pain.  No nausea vomiting. No fever or chills.  Objective: Vitals:   12/02/19 0745 12/02/19 0801 12/02/19 1204 12/02/19 1207  BP: 110/69  116/75   Pulse: 92 82 86 96  Resp: 18  16   Temp: (!) 97.5 F (36.4 C)  98.2 F (36.8 C)   TempSrc: Axillary  Axillary   SpO2: 98% 97% 100% 98%  Weight:      Height:        Intake/Output Summary (Last 24 hours) at 12/02/2019 1245 Last data filed at 12/02/2019 0523 Gross per 24 hour  Intake 935.8 ml  Output 425 ml  Net 510.8 ml   Filed Weights   11/29/19 0225 11/29/19 2214  11/30/19 0101  Weight: 73.9 kg 75.1 kg 75.1 kg    Examination:  General exam: Appears calm and comfortable  Respiratory system: Rhonchi in the base bilaterally. Respiratory effort normal. Cardiovascular system: S1 & S2 heard, RRR. No JVD, murmurs, rubs, gallops or clicks. No pedal edema. Gastrointestinal system: Abdomen is nondistended, soft and nontender. No organomegaly or masses felt. Normal bowel sounds heard. Central nervous system: Alert and oriented x1. No focal neurological deficits. Extremities: Symmetric  Skin: No rashes, lesions or ulcers     Data Reviewed: I  have personally reviewed following labs and imaging studies  CBC: Recent Labs  Lab 11/26/19 0053  WBC 6.3  NEUTROABS 4.6  HGB 12.4*  HCT 38.5*  MCV 94.6  PLT 161   Basic Metabolic Panel: Recent Labs  Lab 11/26/19 0053  NA 136  K 4.1  CL 100  CO2 25  GLUCOSE 128*  BUN 13  CREATININE 0.75  CALCIUM 10.0   GFR: Estimated Creatinine Clearance: 94.1 mL/min (by C-G formula based on SCr of 0.75 mg/dL). Liver Function Tests: No results for input(s): AST, ALT, ALKPHOS, BILITOT, PROT, ALBUMIN in the last 168 hours. No results for input(s): LIPASE, AMYLASE in the last 168 hours. No results for input(s): AMMONIA in the last 168 hours. Coagulation Profile: No results for input(s): INR, PROTIME in the last 168 hours. Cardiac Enzymes: No results for input(s): CKTOTAL, CKMB, CKMBINDEX, TROPONINI in the last 168 hours. BNP (last 3 results) No results for input(s): PROBNP in the last 8760 hours. HbA1C: No results for input(s): HGBA1C in the last 72 hours. CBG: Recent Labs  Lab 12/01/19 2045 12/02/19 0020 12/02/19 0349 12/02/19 0742 12/02/19 1201  GLUCAP 118* 109* 104* 94 120*   Lipid Profile: No results for input(s): CHOL, HDL, LDLCALC, TRIG, CHOLHDL, LDLDIRECT in the last 72 hours. Thyroid Function Tests: No results for input(s): TSH, T4TOTAL, FREET4, T3FREE, THYROIDAB in the last 72 hours. Anemia Panel: No results for input(s): VITAMINB12, FOLATE, FERRITIN, TIBC, IRON, RETICCTPCT in the last 72 hours. Sepsis Labs: No results for input(s): PROCALCITON, LATICACIDVEN in the last 168 hours.  No results found for this or any previous visit (from the past 240 hour(s)).       Radiology Studies: No results found.      Scheduled Meds: . amLODipine  10 mg Per Tube Daily  . chlorhexidine gluconate (MEDLINE KIT)  15 mL Mouth Rinse BID  . Chlorhexidine Gluconate Cloth  6 each Topical Q0600  . dolutegravir  50 mg Oral Daily  . emtricitabine-tenofovir AF  1 tablet Per  Tube Daily  . feeding supplement (PROSource TF)  45 mL Per Tube TID  . folic acid  1 mg Per Tube Daily  . heparin injection (subcutaneous)  5,000 Units Subcutaneous Q8H  . levETIRAcetam  1,500 mg Per Tube BID  . mouth rinse  15 mL Mouth Rinse 10 times per day  . polyethylene glycol  17 g Per Tube Daily  . scopolamine  1 patch Transdermal Q72H  . sulfamethoxazole-trimethoprim  1 tablet Per Tube Once per day on Mon Wed Fri  . thiamine  100 mg Per Tube Daily   Continuous Infusions: . sodium chloride 10 mL/hr at 12/01/19 2054  . feeding supplement (OSMOLITE 1.5 CAL) 1,000 mL (12/02/19 0256)     LOS: 31 days    Time spent: 26 minutes    Sharen Hones, MD [facility]   To contact the attending provider between 7A-7P or the covering provider during after hours 7P-7A,  please log into the web site [email or web address] and access using universal [place] password for that web site. If you do not have the password, please call the hospital operator.  12/02/2019, 12:45 PM

## 2019-12-02 NOTE — Progress Notes (Signed)
Received patient for care around 1900 from off-going nurse. MAR, chart, and labs reviewed. Patient alert, non-verbal, unable to follow commands. Opens eyes spontaneously.Patient's oxygen saturation remained above 92% on trach collar 5L/28%. Noted decrease in previous frequent large amounts of productive thick, white/yellow secretions from trach.  Oral care performed every 2 hours.Skin assessed with no new skin issues noted. Patient turned and repositioned every 2 hours and as needed with 1assist. Patient incontinent x2.Condom catheter in place. Moisture barrier cream applied to sacrum/perineum. Tube feeds running at ordered rate and patient tolerating well with no residuals noted. Vitals stable.bed in low position, wheels locked. Unable to utilize call bell. Frequent safety and roundingcheckscompleted.No acute events this shift. Will continue to monitor for signs and symptoms of deterioration and report as needed.

## 2019-12-03 LAB — GLUCOSE, CAPILLARY
Glucose-Capillary: 112 mg/dL — ABNORMAL HIGH (ref 70–99)
Glucose-Capillary: 122 mg/dL — ABNORMAL HIGH (ref 70–99)
Glucose-Capillary: 134 mg/dL — ABNORMAL HIGH (ref 70–99)
Glucose-Capillary: 112 mg/dL — ABNORMAL HIGH (ref 70–99)
Glucose-Capillary: 103 mg/dL — ABNORMAL HIGH (ref 70–99)
Glucose-Capillary: 124 mg/dL — ABNORMAL HIGH (ref 70–99)

## 2019-12-03 NOTE — Progress Notes (Signed)
PROGRESS NOTE    Ryan Orozco  XKG:[id number] DOB: Apr 02, 1965 DOA: 11/01/2019 PCP: Default, Provider, MD   Chief complaint.  Altered mental status. Brief Narrative:  Ryan Washingtonis a 54 y.o.malewith PMH of HIV (dx 1995) and chronic alcohol abuse. Patient was brought to the ED by police [id number] events noted down outside a convenience store. EMS noted him pulseless with PEA.Unknown downtime Successfully resuscitated and brought to ED.  Initial work-up showed UDS positive for THC/benzos, CT scan of head without any significant acute intracranial abnormality. He was admitted to ICU. He was identified to have anoxic brain injury. He failed extubation trials. 10/13, he received a tracheostomy. He was gradually weaned off vent to trach collar. 10/15, IR inserted G-tube. 10/21, transferred out to hospitalist service.   Assessment & Plan:   Active Problems:   Cardiac arrest [facility])   Community acquired pneumonia of left upper lobe of lung   Hypoxic encephalopathy (HCC)   Alcohol abuse   Acute respiratory failure with hypoxia (HCC)   Vomiting   Pressure injury of skin   Small bowel obstruction (HCC)   Palliative care encounter   Bowel obstruction (HCC)   Chronic respiratory failure ([place])  #1.  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 Cardiac arrest before admission to the hospital. Currently, patient is nonverbal, very confused.  Open eyes only. No additional seizures since admission to the hospital. Continue monitoring. Pending placement.  2.  Acute hypoxemic respiratory failure. Status post tracheostomy and PEG tube. Continue current oxygen setting with 5 L over trach collar. Airway secretions better today.  3.  Essential hypertension.  Continue Norvasc.  4.  HIV/AIDS. Continue Bactrim, antiretroviral treatment.    DVT prophylaxis: Heparin Code Status: Full Family Communication: None Disposition Plan:   .   Status is: Inpatient  Remains inpatient appropriate because:Unsafe d/c plan and Inpatient level of care appropriate due to severity of illness   Dispo: The patient is from: Home              Anticipated d/c is to: SNF              Anticipated d/c date is: 2 days              Patient currently is medically stable to d/c.        I/O last 3 completed shifts: In: 935.8 [I.V.:104.8; Other:150; NG/GT:681] Out: 550 [Urine:550] No intake/output data recorded.     Consultants:   None  Procedures: Right posterior tracheostomy and PEG tube  Antimicrobials:None  Subjective: Patient still very confused, opening eyes only.  Airway secretions better today. No fever chills. No nausea vomiting, tolerating tube feeding.  Objective: Vitals:   12/03/19 0737 12/03/19 0804 12/03/19 1149 12/03/19 1152  BP: 102/65  119/80 119/80  Pulse:  92 91   Resp:  18 18   Temp: 98.4 F (36.9 C)   98.5 F (36.9 C)  TempSrc: Axillary   Oral  SpO2:  95% 100%   Weight:      Height:        Intake/Output Summary (Last 24 hours) at 12/03/2019 1311 Last data filed at 12/02/2019 1600 Gross per 24 hour  Intake --  Output 550 ml  Net -550 ml   Filed Weights   11/29/19 2214 11/30/19 0101 12/03/19 0349  Weight: 75.1 kg 75.1 kg 72.8 kg    Examination:  General exam: Appears calm and comfortable  Respiratory system: Clear to auscultation. Respiratory effort normal. Cardiovascular system: S1 &  S2 heard, RRR. No JVD, murmurs, rubs, gallops or clicks. No pedal edema. Gastrointestinal system: Abdomen is nondistended, soft and nontender. No organomegaly or masses felt. Normal bowel sounds heard. Central nervous system: Alert and confused, nonverbal. No focal neurological deficits. Extremities: Symmetric  Skin: No rashes, lesions or ulcers      Data Reviewed: I have personally reviewed following labs and imaging studies  CBC: No results for input(s): WBC, NEUTROABS, HGB, HCT, MCV, PLT in  the last 168 hours. Basic Metabolic Panel: No results for input(s): NA, K, CL, CO2, GLUCOSE, BUN, CREATININE, CALCIUM, MG, PHOS in the last 168 hours. GFR: Estimated Creatinine Clearance: 94.1 mL/min (by C-G formula based on SCr of 0.75 mg/dL). Liver Function Tests: No results for input(s): AST, ALT, ALKPHOS, BILITOT, PROT, ALBUMIN in the last 168 hours. No results for input(s): LIPASE, AMYLASE in the last 168 hours. No results for input(s): AMMONIA in the last 168 hours. Coagulation Profile: No results for input(s): INR, PROTIME in the last 168 hours. Cardiac Enzymes: No results for input(s): CKTOTAL, CKMB, CKMBINDEX, TROPONINI in the last 168 hours. BNP (last 3 results) No results for input(s): PROBNP in the last 8760 hours. HbA1C: No results for input(s): HGBA1C in the last 72 hours. CBG: Recent Labs  Lab 12/02/19 2025 12/02/19 2338 12/03/19 0256 12/03/19 0735 12/03/19 1147  GLUCAP 110* 104* 112* 122* 134*   Lipid Profile: No results for input(s): CHOL, HDL, LDLCALC, TRIG, CHOLHDL, LDLDIRECT in the last 72 hours. Thyroid Function Tests: No results for input(s): TSH, T4TOTAL, FREET4, T3FREE, THYROIDAB in the last 72 hours. Anemia Panel: No results for input(s): VITAMINB12, FOLATE, FERRITIN, TIBC, IRON, RETICCTPCT in the last 72 hours. Sepsis Labs: No results for input(s): PROCALCITON, LATICACIDVEN in the last 168 hours.  No results found for this or any previous visit (from the past 240 hour(s)).       Radiology Studies: No results found.      Scheduled Meds: . amLODipine  10 mg Per Tube Daily  . chlorhexidine gluconate (MEDLINE KIT)  15 mL Mouth Rinse BID  . Chlorhexidine Gluconate Cloth  6 each Topical Q0600  . dolutegravir  50 mg Oral Daily  . emtricitabine-tenofovir AF  1 tablet Per Tube Daily  . feeding supplement (PROSource TF)  45 mL Per Tube TID  . folic acid  1 mg Per Tube Daily  . heparin injection (subcutaneous)  5,000 Units Subcutaneous Q8H  .  levETIRAcetam  1,500 mg Per Tube BID  . mouth rinse  15 mL Mouth Rinse 10 times per day  . polyethylene glycol  17 g Per Tube Daily  . scopolamine  1 patch Transdermal Q72H  . sulfamethoxazole-trimethoprim  1 tablet Per Tube Once per day on Mon Wed Fri  . thiamine  100 mg Per Tube Daily   Continuous Infusions: . sodium chloride 10 mL/hr at 12/01/19 2054  . feeding supplement (OSMOLITE 1.5 CAL) 1,000 mL (12/02/19 0256)     LOS: 32 days    Time spent: 25 minutes    Sharen Hones, MD [facility]   To contact the attending provider between 7A-7P or the covering provider during after hours 7P-7A, please log into the web site [email or web address] and access using universal [place] password for that web site. If you do not have the password, please call the hospital operator.  12/03/2019, 1:11 PM

## 2019-12-04 LAB — GLUCOSE, CAPILLARY
Glucose-Capillary: 125 mg/dL — ABNORMAL HIGH (ref 70–99)
Glucose-Capillary: 89 mg/dL (ref 70–99)
Glucose-Capillary: 110 mg/dL — ABNORMAL HIGH (ref 70–99)
Glucose-Capillary: 82 mg/dL (ref 70–99)
Glucose-Capillary: 106 mg/dL — ABNORMAL HIGH (ref 70–99)
Glucose-Capillary: 114 mg/dL — ABNORMAL HIGH (ref 70–99)
Glucose-Capillary: 90 mg/dL (ref 70–99)

## 2019-12-04 MED ORDER — FREE WATER
150.0000 mL | Status: DC
  Administered 2019-12-04 – 2019-12-08 (×24): 150 mL

## 2019-12-04 MED ORDER — JUVEN PO PACK
1.0000 | PACK | Freq: Two times a day (BID) | ORAL | Status: DC
  Administered 2019-12-04 – 2019-12-18 (×29): 1 via ORAL
  Filled 2019-12-04 (×28): qty 1

## 2019-12-04 NOTE — Progress Notes (Signed)
[facility] PROGRESS NOTE    Ryan Orozco  HWE:[id number] DOB: 02-08-1965 DOA: 11/01/2019 PCP: Default, Provider, MD      Brief Narrative:  Ryan Orozco is a 54 y.o. M with HIV, chronic alcohol use, brought in to the ER after being found down outside a convenience store, EMS found him pulseless in PEA, unknown downtime, successfully resuscitated and brought to the ER.  UDS positive for THC/benzodiazepines.  CT head unremarkable.  Failed extubation trials due to severe anoxic brain injury and had tracheostomy placed.        Assessment & Plan:  Anoxic brain injury due to cardio-respiratory arrest presumed due to alcohol and benzodiazepine overdose Acute hypoxic respiratory failure due to cardiac arrest Tracheostomy and PEG dependent Patient found down, apneic, unresponsive outside convenience store, ethanol level 177 and benzodiazepines/THC in UDS.    ROSC achieved, patient intubated, but with severe anoxic brain injury, failure to wean from ventilator, tracheostomy and PEG dependence now. 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              MDM: The below labs and imaging reports were reviewed and summarized above.  Medication management as above.    DVT prophylaxis: heparin injection 5,000 Units Start: 11/16/19 2200 SCDs Start: 11/01/19 2113  Code Status: FULL Family Communication:           Subjective: The patient has had no new fever.  No respiratory distress.  No increase in secretions.  He has had no meaningful neurological recovery so far.  Objective: Vitals:   12/04/19 0443 12/04/19 0722 12/04/19 0759 12/04/19 1155  BP:  107/72  91/63  Pulse:  76 78   Resp:  11 17   Temp:  97.6 F (36.4 C)    TempSrc:  Oral  Oral  SpO2:   97%   Weight: 73.5 kg     Height:       No intake or output data in the 24 hours ending 12/04/19 1610 Filed Weights   11/30/19 0101 12/03/19 0349 12/04/19 0443  Weight: 75.1 kg 72.8 kg 73.5 kg    Examination: General appearance:  adult male, opens eyes, no other meaningful response, in no acute distress.   HEENT: Anicteric, conjunctiva pink, lids and lashes normal. No nasal deformity, discharge, epistaxis.  Lips moist.   Skin: Warm and dry.   Cardiac: RRR, nl S1-S2, no murmurs appreciated.  Capillary refill is brisk.  JVP normal.  No LE edema.  Radial  pulses 2+ and symmetric. Respiratory: Normal respiratory rate and rhythm.  CTAB without rales or wheezes. Abdomen: Abdomen soft.  No grimace to palpation No ascites, distension, hepatosplenomegaly.   MSK: No deformities or effusions. Neuro: Awake to touch, no meaningful responses to questions, no spontaneous verbalizations.  Does not follow commands.  Psych:     Data Reviewed: I have personally reviewed following labs and imaging studies:  CBC: No results for input(s): WBC, NEUTROABS, HGB, HCT, MCV, PLT in the last 168 hours. Basic Metabolic Panel: No results for input(s): NA, K, CL, CO2, GLUCOSE, BUN, CREATININE, CALCIUM, MG, PHOS in the last 168 hours. GFR: Estimated Creatinine Clearance: 94.1 mL/min (by C-G formula based on SCr of 0.75 mg/dL). Liver Function Tests: No results for input(s): AST, ALT, ALKPHOS, BILITOT, PROT, ALBUMIN in the last 168 hours. No results for input(s): LIPASE, AMYLASE in the last 168 hours. No results for input(s): AMMONIA in the last 168  hours. Coagulation Profile: No results for input(s): INR, PROTIME in the last 168 hours. Cardiac Enzymes: No results for input(s): CKTOTAL, CKMB, CKMBINDEX, TROPONINI in the last 168 hours. BNP (last 3 results) No results for input(s): PROBNP in the last 8760 hours. HbA1C: No results for input(s): HGBA1C in the last 72 hours. CBG: Recent Labs  Lab 12/03/19 2326 12/04/19 0349 12/04/19 0720 12/04/19 1154 12/04/19 1554  GLUCAP 124* 125* 89 110* 82   Lipid Profile: No results for input(s): CHOL, HDL, LDLCALC, TRIG, CHOLHDL, LDLDIRECT in the last 72 hours. Thyroid Function Tests: No results for input(s): TSH, T4TOTAL, FREET4, T3FREE, THYROIDAB in the last 72 hours. Anemia Panel: No results for input(s): VITAMINB12, FOLATE, FERRITIN, TIBC, IRON, RETICCTPCT in the last 72 hours. Urine analysis:    Component Value Date/Time   COLORURINE YELLOW 11/01/2019 1840   APPEARANCEUR CLOUDY (A) 11/01/2019 1840   LABSPEC 1.018 11/01/2019 1840   PHURINE 5.0 11/01/2019 1840   GLUCOSEU NEGATIVE 11/01/2019 1840   HGBUR SMALL (A) 11/01/2019 1840   BILIRUBINUR NEGATIVE 11/01/2019 [place] 11/01/2019 1840   PROTEINUR 100 (A) 11/01/2019 1840   NITRITE NEGATIVE 11/01/2019 1840   LEUKOCYTESUR TRACE (A) 11/01/2019 1840   Sepsis Labs: @LABRCNTIP (procalcitonin:4,lacticacidven:4)  )No results found for this or any previous visit (from the past 240 hour(s)).       Radiology Studies: No results found.      Scheduled Meds: . amLODipine  10 mg Per Tube Daily  . chlorhexidine gluconate (MEDLINE KIT)  15 mL Mouth Rinse BID  . Chlorhexidine Gluconate Cloth  6 each Topical Q0600  . dolutegravir  50 mg Oral Daily  . emtricitabine-tenofovir AF  1 tablet Per Tube Daily  . feeding supplement (PROSource TF)  45 mL Per Tube TID  . folic acid  1 mg Per Tube Daily  . free water  150 mL Per Tube Q4H  . heparin injection (subcutaneous)  5,000 Units Subcutaneous Q8H  . levETIRAcetam   1,500 mg Per Tube BID  . mouth rinse  15 mL Mouth Rinse 10 times per day  . nutrition supplement (JUVEN)  1 packet Oral BID BM  . polyethylene glycol  17 g Per Tube Daily  . scopolamine  1 patch Transdermal Q72H  . sulfamethoxazole-trimethoprim  1 tablet Per Tube Once per day on Mon Wed Fri  . thiamine  100 mg Per Tube Daily   Continuous Infusions: . sodium chloride 10 mL/hr at 12/01/19 2054  . feeding supplement (OSMOLITE 1.5 CAL) 1,000 mL (12/04/19 1129)     LOS: 33 days    Time spent: 25 minutes    Edwin Dada, MD [facility] 12/04/2019, 4:10 PM     Please page though [place] or Epic secure chat:  For [employer], Adult nurse

## 2019-12-04 NOTE — Progress Notes (Signed)
Palliative Medicine RN Note: Discussed pt in team rounds. We have been monitoring his progression, and there are no further palliative needs.   At this time, our service will sign off. If new needs arise, or if the pt/his family request to talk to [place], please re-consult.  Margret Chance Adrain Nesbit, RN, BSN, [facility] Palliative Medicine Team 12/04/2019 12:08 PM Office [phone]

## 2019-12-04 NOTE — Progress Notes (Addendum)
Nutrition Follow-up  DOCUMENTATION CODES:   Not applicable  INTERVENTION:  Continue via PEG: -Osmolite 1.5 cal @ 33m/hr (14429m -451mrosource TID -Add 1 packet Juven BID, each packet provides 95 calories, 2.5 grams of protein (collagen), and 9.8 grams of carbohydrate (3 grams sugar); also contains 7 grams of L-arginine and L-glutamine, 300 mg vitamin C, 15 mg vitamin E, 1.2 mcg vitamin B-12, 9.5 mg zinc, 200 mg calcium, and 1.5 g  Calcium Beta-hydroxy-Beta-methylbutyrate to support wound healing -Add 150m75mee water Q4H (900ml64mrovides 2470 kcals, 128 grams protein, 1997ml 2m water (2237ml t80m free water with IVF)   NUTRITION DIAGNOSIS:   Increased nutrient needs related to acute illness as evidenced by estimated needs.  Ongoing  GOAL:   Patient will meet greater than or equal to 90% of their needs  Met with TF  MONITOR:   Vent status, Skin, TF tolerance, Weight trends, Labs, I & O's  REASON FOR ASSESSMENT:   Ventilator, Consult Enteral/tube feeding initiation and management  ASSESSMENT:   Patient with PMH significant for HIV and ETOH use. Presents this admission with cardiac arrest and suspected aspiration in setting of heavy ETOH use.  10/07 Vomiting, TF held / restarted with recurrent vomiting 10/12 R/L inguinal hernia, partial SBO  10/13 Trach 10/15 PEG 10/18 off vent  Pt is nonverbal. Pt remains on trach collar and is tolerating TF via PEG. Current orders are for Osmolite 1.5 cal @ 60ml/hr21mh 45ml Pro69mce TF TID. Provides 2280 kcals, 123 grams protein, 1097ml free39mer; meets 100% of needs. Will add Juven BID via PEG to aid in wound healing given multiple pressure injuries. Pt pending placement.   Admission wt: 78.9 kg Current wt: 73.5 kg   Labs: CBGs 125-89-110 Medications: Folvite, Miralax, Thiamine IVF: NS @ 10ml/hr  D50mOrder:   Diet Order            Diet NPO time specified  Diet effective midnight                  EDUCATION NEEDS:   Not appropriate for education at this time  Skin:  Skin Assessment: Skin Integrity Issues: Skin Integrity Issues:: Stage II, Stage III, Other (Comment) Stage II: L ankle Stage III: anus Other: puncture abdomen (PEG insertion site)  Last BM:  10/29 type 7  Height:   Ht Readings from Last 1 Encounters:  11/01/19 5' 6"  (1.676 m)    Weight:   Wt Readings from Last 1 Encounters:  12/04/19 73.5 kg    BMI:  Body mass index is 26.15 kg/m.  Estimated Nutritional Needs:   Kcal:  2300-2500  [phone] g  Fluid:  >/= 2 L/day    Takina Busser AverLarkin InaDN RD pager number and weekend/on-call pager number located in [email or web address]

## 2019-12-05 ENCOUNTER — Inpatient Hospital Stay (HOSPITAL_COMMUNITY): Payer: Medicaid Other

## 2019-12-05 LAB — GLUCOSE, CAPILLARY
Glucose-Capillary: 119 mg/dL — ABNORMAL HIGH (ref 70–99)
Glucose-Capillary: 102 mg/dL — ABNORMAL HIGH (ref 70–99)
Glucose-Capillary: 112 mg/dL — ABNORMAL HIGH (ref 70–99)
Glucose-Capillary: 117 mg/dL — ABNORMAL HIGH (ref 70–99)
Glucose-Capillary: 100 mg/dL — ABNORMAL HIGH (ref 70–99)
Glucose-Capillary: 104 mg/dL — ABNORMAL HIGH (ref 70–99)

## 2019-12-05 MED ORDER — POLYETHYLENE GLYCOL 3350 17 G PO PACK: 17.0000 g | PACK | Freq: Every day | ORAL | Status: DC | PRN

## 2019-12-05 NOTE — Plan of Care (Signed)
  Problem: Education: Goal: Knowledge of General Education information will improve Description: Including pain rating scale, medication(s)/side effects and non-pharmacologic comfort measures 12/05/2019 1439 by Ross Ludwig, LPN Outcome: Progressing 12/05/2019 1101 by Ross Ludwig, LPN Outcome: Progressing   Problem: Health Behavior/Discharge Planning: Goal: Ability to manage health-related needs will improve 12/05/2019 1439 by Ross Ludwig, LPN Outcome: Progressing 12/05/2019 1101 by Ross Ludwig, LPN Outcome: Progressing   Problem: Clinical Measurements: Goal: Ability to maintain clinical measurements within normal limits will improve 12/05/2019 1439 by Ross Ludwig, LPN Outcome: Progressing 12/05/2019 1101 by Ross Ludwig, LPN Outcome: Progressing Goal: Will remain free from infection 12/05/2019 1439 by Ross Ludwig, LPN Outcome: Progressing 12/05/2019 1101 by Ross Ludwig, LPN Outcome: Progressing Goal: Diagnostic test results will improve 12/05/2019 1439 by Ross Ludwig, LPN Outcome: Progressing 12/05/2019 1101 by Ross Ludwig, LPN Outcome: Progressing Goal: Respiratory complications will improve 12/05/2019 1439 by Ross Ludwig, LPN Outcome: Progressing 12/05/2019 1101 by Ross Ludwig, LPN Outcome: Progressing Goal: Cardiovascular complication will be avoided 12/05/2019 1439 by Ross Ludwig, LPN Outcome: Progressing 12/05/2019 1101 by Ross Ludwig, LPN Outcome: Progressing   Problem: Activity: Goal: Risk for activity intolerance will decrease 12/05/2019 1439 by Ross Ludwig, LPN Outcome: Progressing 12/05/2019 1101 by Ross Ludwig, LPN Outcome: Progressing   Problem: Nutrition: Goal: Adequate nutrition will be maintained 12/05/2019 1439 by Ross Ludwig, LPN Outcome: Progressing 12/05/2019 1101 by Ross Ludwig, LPN Outcome: Progressing   Problem: Coping: Goal: Level of anxiety will decrease 12/05/2019  1439 by Ross Ludwig, LPN Outcome: Progressing 12/05/2019 1101 by Ross Ludwig, LPN Outcome: Progressing   Problem: Elimination: Goal: Will not experience complications related to bowel motility 12/05/2019 1439 by Ross Ludwig, LPN Outcome: Progressing 12/05/2019 1101 by Ross Ludwig, LPN Outcome: Progressing Goal: Will not experience complications related to urinary retention 12/05/2019 1439 by Ross Ludwig, LPN Outcome: Progressing 12/05/2019 1101 by Ross Ludwig, LPN Outcome: Progressing   Problem: Pain Managment: Goal: General experience of comfort will improve 12/05/2019 1439 by Ross Ludwig, LPN Outcome: Progressing 12/05/2019 1101 by Ross Ludwig, LPN Outcome: Progressing   Problem: Safety: Goal: Ability to remain free from injury will improve 12/05/2019 1439 by Ross Ludwig, LPN Outcome: Progressing 12/05/2019 1101 by Ross Ludwig, LPN Outcome: Progressing   Problem: Skin Integrity: Goal: Risk for impaired skin integrity will decrease 12/05/2019 1439 by Ross Ludwig, LPN Outcome: Progressing 12/05/2019 1101 by Ross Ludwig, LPN Outcome: Progressing

## 2019-12-05 NOTE — Progress Notes (Signed)
NAMEGurinder Orozco, MRN:  [id number], DOB:  10/13/1965, LOS: 34 ADMISSION DATE:  11/01/2019, CONSULTATION DATE:  9/29 REFERRING MD:  EDP, CHIEF COMPLAINT:  Cardiac arrest   Brief History   54 y/o male with HIV found down outside a convenience store on 9/27, received out of hospital CPR.  After ICU admission has had acute encephalopathy, concern for anoxic brain injury.  Received a tracheostomy 10/13.    Past Medical History  ETOH Abuse HIV - dx 1995  Significant Hospital Events   9/29 Admit post PEA arrest.  UDS positive for THC, benzo's.  ETOH 177 10/05 EEG ongoing. Versed restarted overnight due to agitation. Nicardipine increased.  10/06 Developed tachypnea with WUA, no follow commands off sedation  10/07 Vomiting, TF held / restarted with recurrent vomiting 10/16 tracheostomy collar for 9 hours 10/18->10/19 off vent all night  10/20> TC Consults:  Neurology  Procedures:  ETT 9/29 >>10/13 Tracheostomy>>10/3  Significant Diagnostic Tests:  CT head 9/29 >  No evidence of acute intracranial abnormality. Moderate generalized cerebral atrophy, advanced for age. Chronic medially displaced fracture deformity of the right lamina papyracea. Mild ethmoid and maxillary sinus mucosal thickening.  CT cervical spine 9/29 > No evidence of acute fracture to the cervical spine. Partially imaged airspace disease within the imaged lung apices (extensive on the left). Clinical correlation is recommended. 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  EEG 9/29 > Patient was noted to have frequent episodes of axial jerking with eye opening. Concomitant EEG showed generalized polyspikes consistent with myoclonic seizures. EEG also showed continuous generalized background suppression. EEG was not reactive to tactile stimulation. Hyperventilation and photic stimulation were not performed  ECHO 9/30 > No evidence of wall  motion abnormality  MRI 10/3 > Mild DWI hyperintensity involving the caudate and possibly putamen bilaterally. Additionally, possible subtle FLAIR hyperintensity involving the cerebellum, cortex diffusely, and bilateral basal ganglia. Although not diagnostic, these findings could be seen with early hypoxic/ischemic brain injury in this patient status post PEA arrest.   EEG 10/08> severe diffuse encephalopathy likely related to anoxic/hypoxic brain injury  Testicular US 10/10> showed large left hydrocele and left inguinal hernia. No evidence of testicular mass.  MRI brain 10/11: evolving hypoxic, anoxic brain injury compared to previous MRI   CT abdomen pelvis 10/12: Right inguinal hernia and partial small bowel obstruction, large left inguinal hernia  Abdominal x-ray: Small bowel obstruction  Micro Data:  BCx2 9/29 >> negative  Urine 9/29 >> UA few bacteria, WBC, RBC, CaOxalate, and Mucus Covid, Flu A/B 9/29 >> negative Tracheal aspirate 10/5 >> normal flora   Antimicrobials:  Vancomycin 9/29 >> 9/30 Cefepime 9/29 >> 9/30 Cefepime 10/5 >> 10/11  Interim history/subjective:  Tolerating 5L via TC.  Objective   Blood pressure (!) 119/92, pulse 80, temperature 98.8 F (37.1 C), temperature source Axillary, resp. rate 20, height 5\' 6"  (1.676 m), weight 73.5 kg, SpO2 98 %.    FiO2 (%):  [28 %] 28 %   Intake/Output Summary (Last 24 hours) at 12/05/2019 1103 Last data filed at 12/05/2019 0135 Gross per 24 hour  Intake -  Output 1025 ml  Net -1025 ml   Filed Weights   12/03/19 0349 12/04/19 0443 12/05/19 0500  Weight: 72.8 kg 73.5 kg 73.5 kg   Physical Exam: General: Chronically ill-appearing, no acute distress HENT: [place], AT, OP clear, MMM Neck: Trach in place, c/d/i Eyes: EOMI, no scleral icterus Respiratory:  Clear to auscultation bilaterally.  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 S/p trach due to failure to wean from ventilator  Trach/ vent dependence  Anoxic brain injury secondary to cardiac arrest c/b myoclonic seizures Continue Shiley #6 cuffless Continue Scopolamine patch Continue humidified oxygen and titrate for sats > 90% Continue routine trach care PMV as tolerated with Speech  Pulmonary will follow weekly. Please call for any urgent concerns or questions  Best practice:  Diet: tube feeding Pain/Anxiety/Delirium protocol (if indicated): n/a VAP protocol (if indicated): yes DVT prophylaxis: sub q heparin GI prophylaxis: pantoprazole Glucose control: SSI Mobility: bed rest Code Status: full Family Communication: per primary Disposition: per primary   Critical care time: n/a minutes    Mechele Collin, M.D. [facility] Pulmonary/Critical Care Medicine 12/05/2019 11:03 AM

## 2019-12-05 NOTE — Plan of Care (Signed)

## 2019-12-05 NOTE — Progress Notes (Signed)
Patient found without trach collar in place. Trach collar placed on patient at 28% oxygen. RT will continue to monitor.

## 2019-12-05 NOTE — Progress Notes (Signed)
[facility] PROGRESS NOTE    Ryan Orozco  MVE:[id number] DOB: 08/17/1965 DOA: 11/01/2019 PCP: Default, Provider, MD      Brief Narrative:  Ryan Orozco is a 54 y.o. M with HIV, chronic alcohol use, brought in to the ER after being found down outside a convenience store, EMS found him pulseless in PEA, unknown downtime, successfully resuscitated and brought to the ER.  UDS positive for THC/benzodiazepines.  CT head unremarkable.  Failed extubation trials due to severe anoxic brain injury and had tracheostomy placed.   Significant Events: 9/29: Admitted post PEA arrest 10/6: Not following commands off sedation 10/13: Tracheostomy placed 10/15: PEG placed 10/18 > 10/19: Off vent all night     Assessment & Plan:  Anoxic brain injury due to cardio-respiratory arrest presumed due to alcohol and benzodiazepine overdose Acute hypoxic respiratory failure due to cardiac arrest Tracheostomy and PEG dependent Patient found down, apneic, unresponsive outside convenience store, ethanol level 177 and benzodiazepines/THC in UDS.    ROSC achieved, patient intubated, but with severe anoxic brain injury, failure to wean from ventilator, tracheostomy and PEG dependent now.  Unclear expectations for neurologic recovery.  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              MDM: The below labs and imaging reports were reviewed and summarized above.  Medication management as above.    DVT prophylaxis: heparin injection 5,000 Units Start: 11/16/19 2200 SCDs Start: 11/01/19 2113  Code Status: FULL Family Communication:          Subjective: Nursing reports large soft BM, will decrease MiraLAX.  No new fever, respiratory distress, increasing secretions, change in mentation.  He has had no meaningful neurological recovery so far    Objective: Vitals:   12/05/19 0749 12/05/19 0800 12/05/19 1210 12/05/19 1218  BP:  (!) 119/92    Pulse: 73 80    Resp: 20 20    Temp:  98.8 F (37.1 C)    TempSrc:  Axillary    SpO2: 98% 98% 96% 96%  Weight:      Height:        Intake/Output Summary (Last 24 hours) at 12/05/2019 1531 Last data filed at 12/05/2019 0135 Gross per 24 hour  Intake --  Output 1025 ml  Net -1025 ml   Filed Weights   12/03/19 0349 12/04/19 0443 12/05/19 0500  Weight: 72.8 kg 73.5 kg 73.5 kg    Examination: General appearance: Adult male, lying in bed, on humidifier, no acute distress.  Eyes open, does not make responses to  verbal or tactile stimuli.     HEENT:    Skin:  Cardiac: RRR, no murmurs, no lower extremity edema Respiratory: Normal respiratory rate and rhythm, some upper airway secretions, scant, no rales or wheezes Abdomen: Abdomen soft no tenderness palpation or guarding MSK:  Neuro: Awake to touch, but no meaningful response to questions, no spontaneous verbalizations, no purposeful movements, does not follow commands.  Eyes roving, does not make eye contact Psych: Unable to assess          Data Reviewed: I have personally reviewed following labs and imaging studies:  CBC: No results for input(s): WBC, NEUTROABS, HGB, HCT, MCV, PLT in  the last 168 hours. Basic Metabolic Panel: No results for input(s): NA, K, CL, CO2, GLUCOSE, BUN, CREATININE, CALCIUM, MG, PHOS in the last 168 hours. GFR: Estimated Creatinine Clearance: 94.1 mL/min (by C-G formula based on SCr of 0.75 mg/dL). Liver Function Tests: No results for input(s): AST, ALT, ALKPHOS, BILITOT, PROT, ALBUMIN in the last 168 hours. No results for input(s): LIPASE, AMYLASE in the last 168 hours. No results for input(s): AMMONIA in the last 168 hours. Coagulation Profile: No results for input(s): INR, PROTIME in the last 168 hours. Cardiac Enzymes: No results for input(s): CKTOTAL, CKMB, CKMBINDEX, TROPONINI in the last 168 hours. BNP (last 3 results) No results for input(s): PROBNP in the last 8760 hours. HbA1C: No results for input(s): HGBA1C in the last 72 hours. CBG: Recent Labs  Lab 12/04/19 2220 12/04/19 2322 12/05/19 0316 12/05/19 0805 12/05/19 1235  GLUCAP 114* 90 119* 102* 112*   Lipid Profile: No results for input(s): CHOL, HDL, LDLCALC, TRIG, CHOLHDL, LDLDIRECT in the last 72 hours. Thyroid Function Tests: No results for input(s): TSH, T4TOTAL, FREET4, T3FREE, THYROIDAB in the last 72 hours. Anemia Panel: No results for input(s): VITAMINB12, FOLATE, FERRITIN, TIBC, IRON, RETICCTPCT in the last 72 hours. Urine analysis:    Component Value Date/Time   COLORURINE YELLOW 11/01/2019 1840   APPEARANCEUR CLOUDY (A) 11/01/2019 1840   LABSPEC 1.018 11/01/2019 1840   PHURINE 5.0 11/01/2019 1840   GLUCOSEU NEGATIVE 11/01/2019 1840   HGBUR SMALL (A) 11/01/2019 1840   BILIRUBINUR NEGATIVE 11/01/2019 [place] 11/01/2019 1840   PROTEINUR 100 (A) 11/01/2019 1840   NITRITE NEGATIVE 11/01/2019 1840   LEUKOCYTESUR TRACE (A) 11/01/2019 1840   Sepsis Labs: @LABRCNTIP (procalcitonin:4,lacticacidven:4)  )No results found for this or any previous visit (from the past 240 hour(s)).       Radiology Studies: No results  found.      Scheduled Meds: . amLODipine  10 mg Per Tube Daily  . chlorhexidine gluconate (MEDLINE KIT)  15 mL Mouth Rinse BID  . Chlorhexidine Gluconate Cloth  6 each Topical Q0600  . dolutegravir  50 mg Oral Daily  . emtricitabine-tenofovir AF  1 tablet Per Tube Daily  . feeding supplement (PROSource TF)  45 mL Per Tube TID  . folic acid  1 mg Per Tube Daily  . free water  150 mL Per Tube Q4H  . heparin injection (subcutaneous)  5,000 Units Subcutaneous Q8H  . levETIRAcetam  1,500 mg Per Tube BID  . mouth rinse  15 mL Mouth Rinse 10 times per day  . nutrition supplement (JUVEN)  1 packet Oral BID BM  . scopolamine  1 patch Transdermal Q72H  . sulfamethoxazole-trimethoprim  1 tablet Per Tube Once per day on Mon Wed Fri  . thiamine  100 mg Per Tube Daily   Continuous Infusions: . feeding supplement (  OSMOLITE 1.5 CAL) 1,000 mL (12/04/19 2229)     LOS: 34 days    Time spent: 25 minutes    Edwin Dada, MD [facility] 12/05/2019, 3:31 PM     Please page though [place] or Epic secure chat:  For [employer], Adult nurse

## 2019-12-06 LAB — GLUCOSE, CAPILLARY
Glucose-Capillary: 101 mg/dL — ABNORMAL HIGH (ref 70–99)
Glucose-Capillary: 116 mg/dL — ABNORMAL HIGH (ref 70–99)
Glucose-Capillary: 103 mg/dL — ABNORMAL HIGH (ref 70–99)
Glucose-Capillary: 93 mg/dL (ref 70–99)
Glucose-Capillary: 113 mg/dL — ABNORMAL HIGH (ref 70–99)
Glucose-Capillary: 95 mg/dL (ref 70–99)

## 2019-12-06 NOTE — Evaluation (Signed)
Physical Therapy Evaluation Patient Details Name: Ryan Orozco MRN: [id number] DOB: Nov 04, 1965 Today's Date: 12/06/2019   History of Present Illness  54 year old male found down outside convenience store 9/27. Pt with PEA and hypoxic brain injury. Trach 10/13, PEG 10/15. PMHx: HIV AIDS and alcohol abuse  Clinical Impression  Patient received in bed, alert, non verbal with trach collar present. Patient is able to follow simple commands to move extremities with increased time and effort. (which is improvement from initial evaluation). He continues to require total assist at this time for bed mobility. He is unable to perform therex at this time, therefore assisted patient with PROM to BLEs. Patient will continue to benefit from skilled PT while here to improve functional independence and strength.     Follow Up Recommendations SNF;LTACH;Supervision/Assistance - 24 hour    Equipment Recommendations  Hospital bed    Recommendations for Other Services       Precautions / Restrictions Precautions Precautions: Fall Precaution Comments: trach, PEG, anoxic Restrictions Weight Bearing Restrictions: No      Mobility  Bed Mobility Overal bed mobility: Needs Assistance Bed Mobility: Rolling Rolling: Total assist         General bed mobility comments: Patient continues to be total assist for bed mobility. However has improved command following since initial evaluation.    Transfers                 General transfer comment: not attempted  Ambulation/Gait             General Gait Details: unable  Stairs            Wheelchair Mobility    Modified Rankin (Stroke Patients Only)       Balance Overall balance assessment: Needs assistance   Sitting balance-Leahy Scale: Zero                                       Pertinent Vitals/Pain Pain Assessment:  (patient unable to respond)    Home Living Family/patient expects to be discharged  to:: Skilled nursing facility                 Additional Comments: pt was independent, living with brother who has since passed during this admission    Prior Function Level of Independence: Independent               Hand Dominance        Extremity/Trunk Assessment   Upper Extremity Assessment Upper Extremity Assessment: Defer to OT evaluation    Lower Extremity Assessment Lower Extremity Assessment: Generalized weakness;Difficult to assess due to impaired cognition RLE Deficits / Details: patient able to move feet and flex knees on command       Communication   Communication: Tracheostomy;Expressive difficulties  Cognition Arousal/Alertness: Awake/alert Behavior During Therapy: WFL for tasks assessed/performed Overall Cognitive Status: Impaired/Different from baseline                 Rancho Levels of Cognitive Functioning Rancho Mirant Scales of Cognitive Functioning: Localized response (Not a TBI -using scale to measure progress only)               General Comments: patient is alert, able to follow simple commands with UE/LE movement.      General Comments      Exercises Other Exercises Other Exercises: some active movement of LEs and UEs but not able  to do active exercise. PROM of B LEs for hip flexion, hip abd, knee flextion, ankle DF/PF   Assessment/Plan    PT Assessment Patient needs continued PT services  PT Problem List Decreased strength;Decreased mobility;Decreased cognition;Cardiopulmonary status limiting activity;Decreased safety awareness;Decreased balance;Decreased activity tolerance       PT Treatment Interventions Therapeutic activities;Therapeutic exercise;Functional mobility training;Balance training;Patient/family education    PT Goals (Current goals can be found in the Care Plan section)  Acute Rehab PT Goals Patient Stated Goal: none stated PT Goal Formulation: Patient unable to participate in goal setting Time  For Goal Achievement: 12/20/19    Frequency Min 2X/week   Barriers to discharge        Co-evaluation               AM-PAC PT "6 Clicks" Mobility  Outcome Measure Help needed turning from your back to your side while in a flat bed without using bedrails?: Total Help needed moving from lying on your back to sitting on the side of a flat bed without using bedrails?: Total Help needed moving to and from a bed to a chair (including a wheelchair)?: Total Help needed standing up from a chair using your arms (e.g., wheelchair or bedside chair)?: Total Help needed to walk in hospital room?: Total Help needed climbing 3-5 steps with a railing? : Total 6 Click Score: 6    End of Session Equipment Utilized During Treatment: Oxygen Activity Tolerance: Patient tolerated treatment well Patient left: in bed Nurse Communication: Mobility status;Need for lift equipment PT Visit Diagnosis: Other abnormalities of gait and mobility (R26.89);Muscle weakness (generalized) (M62.81)    Time: 7412-8786 PT Time Calculation (min) (ACUTE ONLY): 15 min   Charges:   PT Evaluation $PT Eval Moderate Complexity: 1 Mod PT Treatments $Therapeutic Exercise: 8-22 mins        Surafel Hilleary, PT, GCS 12/06/19,1:57 PM

## 2019-12-06 NOTE — Progress Notes (Signed)
  Speech Language Pathology Treatment: Cognitive-Linquistic;Passy Muir Speaking valve  Patient Details Name: Ryan Orozco MRN: [id number] DOB: 1965-11-21 Today's Date: 12/06/2019 Time: 0973-5329 SLP Time Calculation (min) (ACUTE ONLY): 19 min  Assessment / Plan / Recommendation Clinical Impression  Ryan Orozco has made great progress since previous session. Pt's sister and RN present and both heard him say "hi" (over trach) in response to sister's greeting. He did not have a traumatic brain injury although the Rancho scale being used to track pt's progress and now a level III (localized response). Pt made eye contact on multiple occasions with therapist and daughter via Face time. He interacted/responsed to stimuli presented with hand over hand use of toothbrush and opened oral cavity, smiled multiple times in response to therapist's verbalizations. One command was followed and approximated "I love you" when asked to repeat to daughter on video. Tolerated speaking valve and spontaneous and possible purposeful verbalization x 1. Recommended PT re-assessment and RN reported the order was placed yesterday. Recommend OT consult as well. For future sessions will attempt to treat with other disciplines to maximize responses.    HPI HPI: 54 y/o male with hx of HIV, ETOH abuse found down outside a convenience store on 9/27, received out of hospital CPR. Presents with acute hypoxic respiratory failure s/p cardiac arrest, acute encephalopathy due to hypoxic brain injury. Intubated 9/27-10/13. Received a tracheostomy 10/13, treachestomy collar trial for 9 hours on 10/16. CXR on 10/17 improving and shows left lung opacities have essentially resolved.      SLP Plan  Continue with current plan of care       Recommendations         Patient may use Passy-Muir Speech Valve: with SLP only PMSV Supervision: Full         Oral Care Recommendations: Oral care QID Follow up Recommendations: LTACH;Skilled  Nursing facility SLP Visit Diagnosis: Aphonia (R49.1);Cognitive communication deficit (J24.268) Plan: Continue with current plan of care       GO                Royce Macadamia 12/06/2019, 11:34 AM [name].Ed Nurse, children's [phone] Office [phone]

## 2019-12-06 NOTE — Progress Notes (Signed)
PROGRESS NOTE    [place]on [place]  YBO:[id number] DOB: [birth year] DOA: 11/01/2019 PCP: Default, Provider, MD   Chief Complain: Unresponsive  Brief Narrative: Patient is a 54 year old male with HIV, chronic alcohol abuse who was presented to the emergency department after being found unresponsive outside a convinient story.  EMS found him pulseless in PEA, unknown downtime, successfully resuscitated and brought to the emergency department.  UDS positive for THC/benzodiazepines.  CT head unremarkable.  Failed extubation trials due to severe anoxic brain injury and had tracheostomy placed.  PEG placed on 10/15.  Now the plan is for placement in a skilled nursing facility/LTAC.   Assessment & Plan:   Active Problems:   Cardiac arrest [facility])   Community acquired pneumonia of left upper lobe of lung   Hypoxic encephalopathy (HCC)   Alcohol abuse   Acute respiratory failure with hypoxia (HCC)   Vomiting   Pressure injury of skin   Small bowel obstruction (HCC)   Palliative care encounter   Bowel obstruction (HCC)   Chronic respiratory failure (HCC)   Anoxic brain injury: Secondary to cardiorespiratory arrest presumed to be from alcohol/benzodiazepines overdose.  UDS positive for THC/benzodiazepines.  Elevated ethanol level on presentation.  In PEA when EMS arrived.  Successfully resuscitated.  Intubated. hospital course remarkable for persistent respiratory failure, inability for extubation.  Status post tracheostomy/PEG dependent.  Does not follow commands.  Poor prognosis for neurological recovery.  Trach  downsized ,now on trach collar.  Trach team following.  Needs referral to trach clinic on discharge.  Continue tube feeding  Hypertension: Currently blood stable.  Continue amlodipine  HIV: CD4 count of 124.  Continue DVT and Descovy.  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  Continue Keppra  Stage II sacral pressure injury/left ankle: Not  present on admission.  Wound care following.  Goals of care: Palliative care was involved during this hospitalization.  Goals of care were discussed. remains full code.  Palliative care recommended outpatient follow-up with palliative providers.  Nutrition Problem: Increased nutrient needs Etiology: acute illness      DVT prophylaxis: Heparin [place] Code Status: Full Family Communication: None at bedside  Status is: Inpatient  Remains inpatient appropriate because:Unsafe d/c plan   Dispo: The patient is from: Home              Anticipated d/c is to: LTAC              Anticipated d/c date is: > 3 days              Patient currently is medically stable to d/c.    Consultants: PCCM,palliative care  Procedures: Intubation, tracheostomy, PEG placement  Antimicrobials:  Anti-infectives (From admission, onward)   Start     Dose/Rate Route Frequency Ordered Stop   11/17/19 1548  ceFAZolin (ANCEF) 2-4 GM/100ML-% IVPB       Note to Pharmacy: Domenick Bookbinder   : cabinet override      11/17/19 1548 11/18/19 0359   11/16/19 1515  ceFAZolin (ANCEF) IVPB 2g/100 mL premix        2 g 200 mL/hr over 30 Minutes Intravenous To Radiology 11/16/19 1511 11/17/19 1625   11/15/19 0900  sulfamethoxazole-trimethoprim (BACTRIM DS) 800-160 MG per tablet 1 tablet        1 tablet Per Tube Once per day on Mon Wed Fri 11/13/19 1906     11/13/19 1615  dolutegravir (TIVICAY) tablet 50 mg  50 mg Oral Daily 11/13/19 1521     11/13/19 1615  emtricitabine-tenofovir AF (DESCOVY) 200-25 MG per tablet 1 tablet        1 tablet Per Tube Daily 11/13/19 1521     11/13/19 1530  sulfamethoxazole-trimethoprim (BACTRIM DS) 800-160 MG per tablet 1 tablet  Status:  Discontinued        1 tablet Oral Once per day on Mon Wed Fri 11/13/19 1441 11/13/19 1906   11/13/19 1500  bictegravir-emtricitabine-tenofovir AF (BIKTARVY) 50-200-25 MG per tablet 1 tablet  Status:  Discontinued        1 tablet Oral Daily 11/13/19 1403  11/13/19 1521   11/10/19 1000  erythromycin 250 mg in sodium chloride 0.9 % 100 mL IVPB        250 mg 100 mL/hr over 60 Minutes Intravenous Every 8 hours 11/10/19 0907 11/11/19 2000   11/07/19 1200  ceFEPIme (MAXIPIME) 2 g in sodium chloride 0.9 % 100 mL IVPB        2 g 200 mL/hr over 30 Minutes Intravenous Every 8 hours 11/07/19 1013 11/13/19 2127   11/03/19 1200  cefTRIAXone (ROCEPHIN) 2 g in sodium chloride 0.9 % 100 mL IVPB        2 g 200 mL/hr over 30 Minutes Intravenous Every 24 hours 11/03/19 0922 11/05/19 1427   11/02/19 0800  vancomycin (VANCOREADY) IVPB 750 mg/150 mL  Status:  Discontinued        750 mg 150 mL/hr over 60 Minutes Intravenous Every 12 hours 11/01/19 1935 11/02/19 1105   11/02/19 0600  piperacillin-tazobactam (ZOSYN) IVPB 3.375 g  Status:  Discontinued        3.375 g 12.5 mL/hr over 240 Minutes Intravenous Every 8 hours 11/02/19 0311 11/03/19 0920   11/01/19 2200  ceFEPIme (MAXIPIME) 2 g in sodium chloride 0.9 % 100 mL IVPB  Status:  Discontinued        2 g 200 mL/hr over 30 Minutes Intravenous Every 12 hours 11/01/19 2143 11/02/19 0311   11/01/19 1915  vancomycin (VANCOREADY) IVPB 1500 mg/300 mL        1,500 mg 150 mL/hr over 120 Minutes Intravenous  Once 11/01/19 1909 11/01/19 2147   11/01/19 1845  cefTRIAXone (ROCEPHIN) 1 g in sodium chloride 0.9 % 100 mL IVPB        1 g 200 mL/hr over 30 Minutes Intravenous  Once 11/01/19 1842 11/01/19 2051   11/01/19 1845  azithromycin (ZITHROMAX) 500 mg in sodium chloride 0.9 % 250 mL IVPB        500 mg 250 mL/hr over 60 Minutes Intravenous  Once 11/01/19 1842 11/01/19 2051      Subjective:  Patient seen and examined at the bedside this morning.  Comfortable.  Hemodynamically stable.  No active issues  Objective: Vitals:   12/06/19 0314 12/06/19 0412 12/06/19 0819 12/06/19 0841  BP: 99/72   92/78  Pulse: 78 65 (!) 114 73  Resp: 18 16 (!) 22 17  Temp: 97.6 F (36.4 C)   98 F (36.7 C)  TempSrc: Axillary    Axillary  SpO2: 100%  99% 98%  Weight:      Height:        Intake/Output Summary (Last 24 hours) at 12/06/2019 1135 Last data filed at 12/06/2019 1030 Gross per 24 hour  Intake 5551 ml  Output 1270 ml  Net 4281 ml   Filed Weights   12/05/19 0500 12/06/19 0001 12/06/19 0120  Weight: 73.5 kg 75.1 kg 75.1 kg  Examination:  General exam: Appears calm and comfortable ,Not in distress,average built HEENT:Trach collar Respiratory system: Bilateral equal air entry, normal vesicular breath sounds, no wheezes or crackles  Cardiovascular system: S1 & S2 heard, RRR. No JVD, murmurs, rubs, gallops or clicks. No pedal edema. Gastrointestinal system: Abdomen is nondistended, soft and nontender. No organomegaly or masses felt. Normal bowel sounds heard.PEG Central nervous system: Alert and awake but not oriented.  Extremities: No edema, no clubbing ,no cyanosis Skin: pressure ulcers as above,crusts on feet     Data Reviewed: I have personally reviewed following labs and imaging studies  CBC: No results for input(s): WBC, NEUTROABS, HGB, HCT, MCV, PLT in the last 168 hours. Basic Metabolic Panel: No results for input(s): NA, K, CL, CO2, GLUCOSE, BUN, CREATININE, CALCIUM, MG, PHOS in the last 168 hours. GFR: Estimated Creatinine Clearance: 94.1 mL/min (by C-G formula based on SCr of 0.75 mg/dL). Liver Function Tests: No results for input(s): AST, ALT, ALKPHOS, BILITOT, PROT, ALBUMIN in the last 168 hours. No results for input(s): LIPASE, AMYLASE in the last 168 hours. No results for input(s): AMMONIA in the last 168 hours. Coagulation Profile: No results for input(s): INR, PROTIME in the last 168 hours. Cardiac Enzymes: No results for input(s): CKTOTAL, CKMB, CKMBINDEX, TROPONINI in the last 168 hours. BNP (last 3 results) No results for input(s): PROBNP in the last 8760 hours. HbA1C: No results for input(s): HGBA1C in the last 72 hours. CBG: Recent Labs  Lab 12/05/19 1643  12/05/19 2008 12/05/19 2310 12/06/19 0317 12/06/19 0724  GLUCAP 117* 100* 104* 101* 116*   Lipid Profile: No results for input(s): CHOL, HDL, LDLCALC, TRIG, CHOLHDL, LDLDIRECT in the last 72 hours. Thyroid Function Tests: No results for input(s): TSH, T4TOTAL, FREET4, T3FREE, THYROIDAB in the last 72 hours. Anemia Panel: No results for input(s): VITAMINB12, FOLATE, FERRITIN, TIBC, IRON, RETICCTPCT in the last 72 hours. Sepsis Labs: No results for input(s): PROCALCITON, LATICACIDVEN in the last 168 hours.  No results found for this or any previous visit (from the past 240 hour(s)).       Radiology Studies: DG Chest 1 View  Result Date: 12/05/2019 CLINICAL DATA:  Dyspnea EXAM: CHEST  1 VIEW COMPARISON:  11/19/2019 FINDINGS: Tracheostomy tube is noted in satisfactory position. Cardiac shadow is stable. Lungs are hypoinflated with mild bibasilar atelectatic changes. No sizable effusion is seen. No bony abnormality is noted. IMPRESSION: Hypoinflation with mild bibasilar atelectasis. Electronically Signed   By: Inez Catalina M.D.   On: 12/05/2019 21:10        Scheduled Meds: . amLODipine  10 mg Per Tube Daily  . chlorhexidine gluconate (MEDLINE KIT)  15 mL Mouth Rinse BID  . Chlorhexidine Gluconate Cloth  6 each Topical Q0600  . dolutegravir  50 mg Oral Daily  . emtricitabine-tenofovir AF  1 tablet Per Tube Daily  . feeding supplement (PROSource TF)  45 mL Per Tube TID  . folic acid  1 mg Per Tube Daily  . free water  150 mL Per Tube Q4H  . heparin injection (subcutaneous)  5,000 Units Subcutaneous Q8H  . levETIRAcetam  1,500 mg Per Tube BID  . mouth rinse  15 mL Mouth Rinse 10 times per day  . nutrition supplement (JUVEN)  1 packet Oral BID BM  . scopolamine  1 patch Transdermal Q72H  . sulfamethoxazole-trimethoprim  1 tablet Per Tube Once per day on Mon Wed Fri  . thiamine  100 mg Per Tube Daily   Continuous Infusions: . feeding  supplement (OSMOLITE 1.5 CAL) 1,000 mL  (12/06/19 0359)     LOS: 35 days    Time spent: 35 mins.More than 50% of that time was spent in counseling and/or coordination of care.      Shelly Coss, MD [facility] P11/04/2019, 11:35 AM

## 2019-12-07 LAB — GLUCOSE, CAPILLARY
Glucose-Capillary: 112 mg/dL — ABNORMAL HIGH (ref 70–99)
Glucose-Capillary: 96 mg/dL (ref 70–99)
Glucose-Capillary: 119 mg/dL — ABNORMAL HIGH (ref 70–99)
Glucose-Capillary: 113 mg/dL — ABNORMAL HIGH (ref 70–99)
Glucose-Capillary: 117 mg/dL — ABNORMAL HIGH (ref 70–99)

## 2019-12-07 NOTE — Progress Notes (Signed)
PROGRESS NOTE    [place]  XFG:[id number] DOB: 1966-01-31 DOA: 11/01/2019 PCP: Default, Provider, MD   Chief Complain: Unresponsive  Brief Narrative: Patient is a 54 year old male with HIV, chronic alcohol abuse who was presented to the emergency department after being found unresponsive outside a convinient store.  EMS found him pulseless in PEA, unknown downtime, successfully resuscitated and brought to the emergency department.  UDS positive for THC/benzodiazepines.  CT head unremarkable.  Failed extubation trials due to severe anoxic brain injury and had tracheostomy placed.  PEG placed on 10/15.  Now the plan is for placement in a skilled nursing facility/LTAC.  Prolonged hospital course.  Difficult placement. TOC following   Assessment & Plan:   Active Problems:   Cardiac arrest [facility])   Community acquired pneumonia of left upper lobe of lung   Hypoxic encephalopathy (HCC)   Alcohol abuse   Acute respiratory failure with hypoxia (HCC)   Vomiting   Pressure injury of skin   Small bowel obstruction (HCC)   Palliative care encounter   Bowel obstruction (HCC)   Chronic respiratory failure (HCC)   Anoxic brain injury: Secondary to cardiorespiratory arrest presumed to be from alcohol/benzodiazepines overdose.  UDS positive for THC/benzodiazepines.  Elevated ethanol level on presentation.  In PEA when EMS arrived.  Successfully resuscitated.  Intubated. hospital course remarkable for persistent respiratory failure, inability for extubation.  Status post tracheostomy/PEG dependent.  Does not follow commands.  Poor prognosis for neurological recovery.  Trach  downsized ,now on trach collar.  Trach team following.  Needs referral to trach clinic on discharge.  Continue tube feeding  Hypertension: Currently blood stable.  Continue amlodipine  HIV: CD4 count of 124.  Continue HIV meds.  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  Continue  Keppra  Stage II sacral pressure injury/left ankle: Not present on admission.  Wound care following.  Goals of care: Palliative care was involved during this hospitalization.  Goals of care were discussed. remains full code.  Palliative care recommended outpatient follow-up with palliative providers.  Nutrition Problem: Increased nutrient needs Etiology: acute illness      DVT prophylaxis: Heparin [place] Code Status: Full Family Communication: None at bedside  Status is: Inpatient  Remains inpatient appropriate because:Unsafe d/c plan   Dispo: The patient is from: Home              Anticipated d/c is to: LTAC vs SnF              Anticipated d/c date is: > 3 days              Patient currently is medically stable to d/c.    Consultants: PCCM,palliative care  Procedures: Intubation, tracheostomy, PEG placement  Antimicrobials:  Anti-infectives (From admission, onward)   Start     Dose/Rate Route Frequency Ordered Stop   11/17/19 1548  ceFAZolin (ANCEF) 2-4 GM/100ML-% IVPB       Note to Pharmacy: Domenick Bookbinder   : cabinet override      11/17/19 1548 11/18/19 0359   11/16/19 1515  ceFAZolin (ANCEF) IVPB 2g/100 mL premix        2 g 200 mL/hr over 30 Minutes Intravenous To Radiology 11/16/19 1511 11/17/19 1625   11/15/19 0900  sulfamethoxazole-trimethoprim (BACTRIM DS) 800-160 MG per tablet 1 tablet        1 tablet Per Tube Once per day on Mon Wed Fri 11/13/19 1906     11/13/19 1615  dolutegravir (TIVICAY)  tablet 50 mg        50 mg Oral Daily 11/13/19 1521     11/13/19 1615  emtricitabine-tenofovir AF (DESCOVY) 200-25 MG per tablet 1 tablet        1 tablet Per Tube Daily 11/13/19 1521     11/13/19 1530  sulfamethoxazole-trimethoprim (BACTRIM DS) 800-160 MG per tablet 1 tablet  Status:  Discontinued        1 tablet Oral Once per day on Mon Wed Fri 11/13/19 1441 11/13/19 1906   11/13/19 1500  bictegravir-emtricitabine-tenofovir AF (BIKTARVY) 50-200-25 MG per tablet 1 tablet   Status:  Discontinued        1 tablet Oral Daily 11/13/19 1403 11/13/19 1521   11/10/19 1000  erythromycin 250 mg in sodium chloride 0.9 % 100 mL IVPB        250 mg 100 mL/hr over 60 Minutes Intravenous Every 8 hours 11/10/19 0907 11/11/19 2000   11/07/19 1200  ceFEPIme (MAXIPIME) 2 g in sodium chloride 0.9 % 100 mL IVPB        2 g 200 mL/hr over 30 Minutes Intravenous Every 8 hours 11/07/19 1013 11/13/19 2127   11/03/19 1200  cefTRIAXone (ROCEPHIN) 2 g in sodium chloride 0.9 % 100 mL IVPB        2 g 200 mL/hr over 30 Minutes Intravenous Every 24 hours 11/03/19 0922 11/05/19 1427   11/02/19 0800  vancomycin (VANCOREADY) IVPB 750 mg/150 mL  Status:  Discontinued        750 mg 150 mL/hr over 60 Minutes Intravenous Every 12 hours 11/01/19 1935 11/02/19 1105   11/02/19 0600  piperacillin-tazobactam (ZOSYN) IVPB 3.375 g  Status:  Discontinued        3.375 g 12.5 mL/hr over 240 Minutes Intravenous Every 8 hours 11/02/19 0311 11/03/19 0920   11/01/19 2200  ceFEPIme (MAXIPIME) 2 g in sodium chloride 0.9 % 100 mL IVPB  Status:  Discontinued        2 g 200 mL/hr over 30 Minutes Intravenous Every 12 hours 11/01/19 2143 11/02/19 0311   11/01/19 1915  vancomycin (VANCOREADY) IVPB 1500 mg/300 mL        1,500 mg 150 mL/hr over 120 Minutes Intravenous  Once 11/01/19 1909 11/01/19 2147   11/01/19 1845  cefTRIAXone (ROCEPHIN) 1 g in sodium chloride 0.9 % 100 mL IVPB        1 g 200 mL/hr over 30 Minutes Intravenous  Once 11/01/19 1842 11/01/19 2051   11/01/19 1845  azithromycin (ZITHROMAX) 500 mg in sodium chloride 0.9 % 250 mL IVPB        500 mg 250 mL/hr over 60 Minutes Intravenous  Once 11/01/19 1842 11/01/19 2051      Subjective:  Patient seen and examined at the bedside this morning.  No significant change from yesterday.  Does not follow any commands but looks comfortable, alert and awake.  Objective: Vitals:   12/07/19 0258 12/07/19 0300 12/07/19 0610 12/07/19 0754  BP:  (!) 151/76 110/67    Pulse: 90 91 82 86  Resp: 16 18 20 20   Temp:  98.1 F (36.7 C) 97.9 F (36.6 C)   TempSrc:  Oral Oral   SpO2: 99%  97% 100%  Weight:      Height:        Intake/Output Summary (Last 24 hours) at 12/07/2019 0822 Last data filed at 12/07/2019 4825 Gross per 24 hour  Intake 1415 ml  Output 1500 ml  Net -85 ml   Filed  Weights   12/05/19 0500 12/06/19 0001 12/06/19 0120  Weight: 73.5 kg 75.1 kg 75.1 kg    Examination:   General exam: Overall comfortable HEENT: Trach collar Respiratory system: Bilateral equal air entry, normal vesicular breath sounds, no wheezes or crackles  Cardiovascular system: S1 & S2 heard, RRR. No JVD, murmurs, rubs, gallops or clicks. Gastrointestinal system: Abdomen is nondistended, soft and nontender. No organomegaly or masses felt. Normal bowel sounds heard.  PEG Central nervous system: Alert and awake but not oriented.  Does not follow commands  extremities: No edema, no clubbing ,no cyanosis, crusted skin  skin: pressure ulcers as above ,no icterus ,no pallor     Data Reviewed: I have personally reviewed following labs and imaging studies  CBC: No results for input(s): WBC, NEUTROABS, HGB, HCT, MCV, PLT in the last 168 hours. Basic Metabolic Panel: No results for input(s): NA, K, CL, CO2, GLUCOSE, BUN, CREATININE, CALCIUM, MG, PHOS in the last 168 hours. GFR: Estimated Creatinine Clearance: 94.1 mL/min (by C-G formula based on SCr of 0.75 mg/dL). Liver Function Tests: No results for input(s): AST, ALT, ALKPHOS, BILITOT, PROT, ALBUMIN in the last 168 hours. No results for input(s): LIPASE, AMYLASE in the last 168 hours. No results for input(s): AMMONIA in the last 168 hours. Coagulation Profile: No results for input(s): INR, PROTIME in the last 168 hours. Cardiac Enzymes: No results for input(s): CKTOTAL, CKMB, CKMBINDEX, TROPONINI in the last 168 hours. BNP (last 3 results) No results for input(s): PROBNP in the last 8760 hours. HbA1C: No  results for input(s): HGBA1C in the last 72 hours. CBG: Recent Labs  Lab 12/06/19 1604 12/06/19 1932 12/06/19 2303 12/07/19 0310 12/07/19 0746  GLUCAP 93 113* 95 112* 96   Lipid Profile: No results for input(s): CHOL, HDL, LDLCALC, TRIG, CHOLHDL, LDLDIRECT in the last 72 hours. Thyroid Function Tests: No results for input(s): TSH, T4TOTAL, FREET4, T3FREE, THYROIDAB in the last 72 hours. Anemia Panel: No results for input(s): VITAMINB12, FOLATE, FERRITIN, TIBC, IRON, RETICCTPCT in the last 72 hours. Sepsis Labs: No results for input(s): PROCALCITON, LATICACIDVEN in the last 168 hours.  No results found for this or any previous visit (from the past 240 hour(s)).       Radiology Studies: DG Chest 1 View  Result Date: 12/05/2019 CLINICAL DATA:  Dyspnea EXAM: CHEST  1 VIEW COMPARISON:  11/19/2019 FINDINGS: Tracheostomy tube is noted in satisfactory position. Cardiac shadow is stable. Lungs are hypoinflated with mild bibasilar atelectatic changes. No sizable effusion is seen. No bony abnormality is noted. IMPRESSION: Hypoinflation with mild bibasilar atelectasis. Electronically Signed   By: Inez Catalina M.D.   On: 12/05/2019 21:10        Scheduled Meds: . amLODipine  10 mg Per Tube Daily  . chlorhexidine gluconate (MEDLINE KIT)  15 mL Mouth Rinse BID  . Chlorhexidine Gluconate Cloth  6 each Topical Q0600  . dolutegravir  50 mg Oral Daily  . emtricitabine-tenofovir AF  1 tablet Per Tube Daily  . feeding supplement (PROSource TF)  45 mL Per Tube TID  . folic acid  1 mg Per Tube Daily  . free water  150 mL Per Tube Q4H  . heparin injection (subcutaneous)  5,000 Units Subcutaneous Q8H  . levETIRAcetam  1,500 mg Per Tube BID  . mouth rinse  15 mL Mouth Rinse 10 times per day  . nutrition supplement (JUVEN)  1 packet Oral BID BM  . scopolamine  1 patch Transdermal Q72H  . sulfamethoxazole-trimethoprim  1  tablet Per Tube Once per day on Mon Wed Fri  . thiamine  100 mg Per Tube  Daily   Continuous Infusions: . feeding supplement (OSMOLITE 1.5 CAL) 1,000 mL (12/06/19 2253)     LOS: 36 days    Time spent: 35 mins.More than 50% of that time was spent in counseling and/or coordination of care.      Shelly Coss, MD [facility] P11/05/2019, 8:22 AM

## 2019-12-07 NOTE — TOC Progression Note (Addendum)
Transition of Care [facility]) - Progression Note    Patient Details  Name: Ryan Orozco MRN: [id number] Date of Birth: 11/05/1965  [facility]) CM/SW Contact  Beckie Busing, RN Phone Number: [phone]  12/07/2019, 10:45 AM  Clinical Narrative:    CM received message from financial counselor Cathlean Marseilles to inform CM that letter to declare patient unable to make decisions had not been received. CM sent letter again via secure email and Bennie Hind confirmed that letter has now been received and she will submit medicaid application. CM will continue to follow.    Expected Discharge Plan: Long Term Acute Care (LTAC) Barriers to Discharge: Continued Medical Work up  Expected Discharge Plan and Services Expected Discharge Plan: Long Term Acute Care (LTAC)   Discharge Planning Services: CM Consult   Living arrangements for the past 2 months: Single Family Home                                       Social Determinants of Health (SDOH) Interventions    Readmission Risk Interventions No flowsheet data found.

## 2019-12-07 NOTE — Progress Notes (Signed)
  Speech Language Pathology Treatment: Cognitive-Linquistic;Passy Muir Speaking valve  Patient Details Name: Ryan Orozco MRN: [id number] DOB: 1965/12/03 Today's Date: 12/07/2019 Time: 0768-0881 SLP Time Calculation (min) (ACUTE ONLY): 12 min  Assessment / Plan / Recommendation Clinical Impression  Ryan Orozco woke with verbal cues and remained sleepy throughout. Unlike yesterday, today he was not interactive with this therapist's attempts to sustain eye contact, follow commands with max hand over hand assist (Orozco washing etc) Vocalizations audible with spontaneous/vocalic and non purposeful with PMV which he wore 5 minutes. ST will continue cog/pmv tx and next week attempt to treat with PT/OT to maximize function.    HPI HPI: 54 y/o male with hx of HIV, ETOH abuse found down outside a convenience store on 9/27, received out of hospital CPR. Presents with acute hypoxic respiratory failure s/p cardiac arrest, acute encephalopathy due to hypoxic brain injury. Intubated 9/27-10/13. Received a tracheostomy 10/13, treachestomy collar trial for 9 hours on 10/16. CXR on 10/17 improving and shows left lung opacities have essentially resolved.      SLP Plan  Continue with current plan of care       Recommendations         Patient may use Passy-Muir Speech Valve: with SLP only PMSV Supervision: Full         Oral Care Recommendations: Oral care QID Follow up Recommendations: LTACH;Skilled Nursing facility SLP Visit Diagnosis: Aphonia (R49.1);Cognitive communication deficit (J03.159) Plan: Continue with current plan of care                       Ryan Orozco 12/07/2019, 11:28 AM Ryan Orozco.Ed Nurse, children's [phone] Office [phone]

## 2019-12-08 DIAGNOSIS — K4021 Bilateral inguinal hernia, without obstruction or gangrene, recurrent: Secondary | ICD-10-CM | POA: Diagnosis present

## 2019-12-08 DIAGNOSIS — R569 Unspecified convulsions: Secondary | ICD-10-CM

## 2019-12-08 DIAGNOSIS — E46 Unspecified protein-calorie malnutrition: Secondary | ICD-10-CM

## 2019-12-08 DIAGNOSIS — Z930 Tracheostomy status: Secondary | ICD-10-CM

## 2019-12-08 DIAGNOSIS — I5189 Other ill-defined heart diseases: Secondary | ICD-10-CM

## 2019-12-08 DIAGNOSIS — R1310 Dysphagia, unspecified: Secondary | ICD-10-CM

## 2019-12-08 LAB — GLUCOSE, CAPILLARY
Glucose-Capillary: 100 mg/dL — ABNORMAL HIGH (ref 70–99)
Glucose-Capillary: 108 mg/dL — ABNORMAL HIGH (ref 70–99)
Glucose-Capillary: 109 mg/dL — ABNORMAL HIGH (ref 70–99)
Glucose-Capillary: 111 mg/dL — ABNORMAL HIGH (ref 70–99)
Glucose-Capillary: 122 mg/dL — ABNORMAL HIGH (ref 70–99)
Glucose-Capillary: 129 mg/dL — ABNORMAL HIGH (ref 70–99)
Glucose-Capillary: 95 mg/dL (ref 70–99)

## 2019-12-08 MED ORDER — FREE WATER
200.0000 mL | Status: DC
  Administered 2019-12-08 – 2019-12-29 (×124): 200 mL

## 2019-12-08 NOTE — Plan of Care (Signed)
Pt had a bowel movement today! 

## 2019-12-08 NOTE — Progress Notes (Signed)
Nutrition Follow-up  DOCUMENTATION CODES:   Not applicable  INTERVENTION:   -Osmolite 1.5 cal @ 49m/hr via PEG (14465m -Prosource TF 45 ml TID -Juven BID -Increase free water 200 ml Q4 hours   Provides 2470 kcals, 128 grams protein, 1097 ml free water (2297 ml with flushes)  NUTRITION DIAGNOSIS:   Increased nutrient needs related to acute illness as evidenced by estimated needs.  Ongoing  GOAL:   Patient will meet greater than or equal to 90% of their needs  Met with TF  MONITOR:   Vent status, Skin, TF tolerance, Weight trends, Labs, I & O's  REASON FOR ASSESSMENT:   Ventilator, Consult Enteral/tube feeding initiation and management  ASSESSMENT:   Patient with PMH significant for HIV and ETOH use. Presents this admission with cardiac arrest and suspected aspiration in setting of heavy ETOH use.  10/07 Vomiting, TF held / restarted with recurrent vomiting 10/12 R/L inguinal hernia, partial SBO  10/13 Trach 10/15 PEG 10/18 ATC  Remains on ATC. Working with SLP and PMV. Following commands inconsistently. Per RN, pt tolerating tube feeding at goal rate. Having BMs. IVF have been discontinued, increase free water flushes to meet fluid requirement.   Admission wt: 78.9 kg Current wt: 75.1 kg   Medications: folic acid, thiamine  Labs: CBG 96-129  Diet Order:   Diet Order            Diet NPO time specified  Diet effective midnight                 EDUCATION NEEDS:   Not appropriate for education at this time  Skin:  Skin Assessment: Skin Integrity Issues: Skin Integrity Issues:: Stage II, Stage III, Other (Comment) Stage II: L ankle Stage III: anus Other: puncture abdomen (PEG insertion site)  Last BM:  11/4  Height:   Ht Readings from Last 1 Encounters:  11/01/19 5' 6"  (1.676 m)    Weight:   Wt Readings from Last 1 Encounters:  12/06/19 75.1 kg    BMI:  Body mass index is 26.71 kg/m.  Estimated Nutritional Needs:   Kcal:   234514-6047Protein:  115-130 g  Fluid:  >/= 2 L/day  CaMariana SingleD, LDN Clinical Nutrition Pager listed in [facility]

## 2019-12-08 NOTE — Progress Notes (Addendum)
[facility] PROGRESS NOTE  Ryan Orozco XKG:[id number] DOB: 12/26/1965 DOA: 11/01/2019 PCP: Default, Provider, MD  Status: Remains inpatient appropriate because:Altered mental status, Unsafe d/c plan and Inpatient level of care appropriate due to severity of illness. Patient newly diagnosed with anoxic brain injury. Still has excessive tracheal secretions requiring frequent suctioning- has just started PMV training.  Dispo: The patient is from: Home              Anticipated d/c is to: SNF              Anticipated d/c date is: > 3 days              Patient currently is not medically stable to d/c. Patient currently requiring frequent suctioning therefore at this time is not a candidate for discharge. Have also just begun PMV training. Medicaid application in process. Patient will also need to have disability paperwork completed since do not expect full recovery   Code Status: Full Family Communication: No family at bedside DVT prophylaxis: Subcutaneous heparin Vaccination status: Does not appear to have received Covid vaccine but will need to confirm with family  Foley catheter: No  HPI: 54 year old male with HIV, chronic alcohol abuse who was presented to the emergency department after being found unresponsive outside a convinient store.  EMS found him pulseless in PEA, unknown downtime, successfully resuscitated and brought to the emergency department.  UDS positive for THC/benzodiazepines.  CT head unremarkable.  Failed extubation trials due to severe anoxic brain injury and had tracheostomy placed.  PEG placed on 10/15.  Now the plan is for placement in a skilled nursing facility/LTAC.  Prolonged hospital course.  Difficult placement. TOC following  Subjective: Spontaneous but nonpurposeful movement of upper extremities. Was able to move legs upon command. Did not attempt to vocalize over trach.  Objective: Vitals:   12/08/19 0749 12/08/19 0946  BP:  107/74  Pulse: 71 73   Resp: 18 19  Temp:    SpO2: 92%     Intake/Output Summary (Last 24 hours) at 12/08/2019 1237 Last data filed at 12/08/2019 0500 Gross per 24 hour  Intake 746 ml  Output 1550 ml  Net -804 ml   Filed Weights   12/05/19 0500 12/06/19 0001 12/06/19 0120  Weight: 73.5 kg 75.1 kg 75.1 kg    Exam: Constitutional: NAD, calm, appears to be comfortable Eyes: PERRL, lids and conjunctivae normal, appears to have mild bilateral disconjugate gaze Respiratory: Coarse to auscultation bilaterally, #6 cuffless trach in place with thick creamy yellow secretions noted, trach collar in place 28% FiO2 Cardiovascular: Regular rate and rhythm, no murmurs / rubs / gallops. No extremity edema. 2+ pedal pulses. No carotid bruits.  Abdomen: no tenderness, PEG tube with tube feeding infusing, bowel sounds positive.  Musculoskeletal: no clubbing / cyanosis. No joint deformity upper and lower extremities. no contractures no extremity spasticity detected. Decreased muscle tone both upper and lower extremities although was able to lift both legs independently off the bed about 2 inches.  Skin: Several decubiti as described below Neurologic: CN 2-12 grossly intact appears to have mild bilateral disconjugate gaze-uncertain if patient has any visual acuity issues. Sensation appears to be intact both upper and lower extremities, difficult to test strength in upper extremities but appears to be 2/5 with lower extremities 2-3/5 Psychiatric: Alert and oriented x name only. Normal mood and smiled when spoken to.    Assessment/Plan: Anoxic brain injury 2/2 PEA arrest:  -Presumed 2/2 alcohol/benzodiazepines overdose.  UDS positive  for THC/benzodiazepines.  Elevated ethanol level on presentation. -Successfully resuscitated.    -Suspect lifelong sequelae from this event and do not expect patient to recover to baseline mentation; long-term placement in SNF expected -Continue OT/PT/SLP--PT reevaluated patient on 11/3 but  unfortunately continues to require total assist at this time for bed mobility and unable to perform therex. Recommendation is for skilled PT prove functional independence and strength recommendation for SNF vs LTAC  Acute hypoxemic respiratory failure/new tracheostomy tube requirement -Intubated and has subsequently been transitioned off vent to tracheostomy tube but still requiring oxygen -Also noted with increased pale yellow secretions -He has #6 cuffless trach and SLP assisting with PMV training -Trach team following peripherally  Dysphagia 2/2 anoxic brain injury -Once tolerating PMV and more consistent with interaction can assess swallowing -Continue tube feeding per PEG  Hypertension/grade 1 diastolic dysfunction:  -Currently blood stable.   -Continue amlodipine -Echocardiogram this admission with preserved LVEF with evidence of grade 1 diastolic dysfunction. No physiology currently consistent with heart failure  HIV:  -CD4 count of 124.  -Continue HIV meds.   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   -Continue Keppra  Inguinal hernias -I waited by general surgery this admission. Documented as chronic and chronically incarcerated. Patient has been clinically stable and tolerating tube feedings and bowel movements so unless patient obstructed would not be considered a candidate for repair and even if obstructed consulting surgeon was not sure he would be a candidate for hernia repair regardless  Goals of care:  -Palliative care was involved during this hospitalization.   -Goals of care were discussed. remains full code.  - Palliative care recommended outpatient follow-up with palliative providers.  Protein calorie malnutrition: Nutrition Status: Nutrition Problem: Increased nutrient needs Etiology: acute illness Signs/Symptoms: estimated needs Interventions: Tube feeding Estimated body mass index is 26.71 kg/m as  calculated from the following:   Height as of this encounter: 5' 6"  (1.676 m).   Weight as of this encounter: 75.1 kg.  Multiple decubitus ulcers not POA Pressure Injury 11/13/19 Anus Stage 3 -  Full thickness tissue loss. Subcutaneous fat may be visible but bone, tendon or muscle are NOT exposed. (Active)  Date First Assessed/Time First Assessed: 11/13/19 1000   Location: Anus  Staging: Stage 3 -  Full thickness tissue loss. Subcutaneous fat may be visible but bone, tendon or muscle are NOT exposed.  Present on Admission: No    Assessments 11/17/2019  8:00 AM 12/06/2019 10:00 AM  Dressing Type None --  Site / Wound Assessment Pink --  Peri-wound Assessment Intact --  Wound Length (cm) -- 1 cm  Wound Width (cm) -- 0.5 cm  Wound Depth (cm) -- 0 cm  Wound Surface Area (cm^2) -- 0.5 cm^2  Wound Volume (cm^3) -- 0 cm^3  Drainage Amount None --     No Linked orders to display     Wound / Incision (Open or Dehisced) 11/17/19 Puncture Abdomen Left;Anterior;Upper G-tube insertion site  (Active)  Date First Assessed/Time First Assessed: 11/17/19 1646   Wound Type: Puncture  Location: Abdomen  Location Orientation: Left;Anterior;Upper  Wound Description (Comments): G-tube insertion site   Present on Admission: No    Assessments 11/17/2019  4:47 PM 12/06/2019 10:00 AM  Dressing Type Gauze (Comment);Tape dressing --  Dressing Changed New --  Dressing Status Clean;Dry;Intact --  Dressing Change Frequency PRN --  Site / Wound Assessment Clean;Dry;Pink --  Peri-wound Assessment Intact --  Wound Length (cm) -- 0  cm  Wound Width (cm) -- 0 cm  Wound Depth (cm) -- 0 cm  Wound Volume (cm^3) -- 0 cm^3  Wound Surface Area (cm^2) -- 0 cm^2  Closure None --  Drainage Amount None --  Treatment Cleansed --     No Linked orders to display     Pressure Injury 12/01/19 Ankle Left;Lateral Stage 2 -  Partial thickness loss of dermis presenting as a shallow open injury with a red, pink wound bed without  slough. (Active)  Date First Assessed/Time First Assessed: 12/01/19 2002   Location: Ankle  Location Orientation: Left;Lateral  Staging: Stage 2 -  Partial thickness loss of dermis presenting as a shallow open injury with a red, pink wound bed without slough.    Assessments 12/01/2019  7:52 PM 12/06/2019 10:00 AM  Dressing Type Foam - Lift dressing to assess site every shift --  Dressing Clean;Dry;Intact --  Site / Wound Assessment Pink --  Peri-wound Assessment Intact --  Wound Length (cm) -- 2.5 cm  Wound Width (cm) -- 0.5 cm  Wound Depth (cm) -- 0 cm  Wound Surface Area (cm^2) -- 1.25 cm^2  Wound Volume (cm^3) -- 0 cm^3  Margins Unattached edges (unapproximated) --  Drainage Amount Scant --  Drainage Description Serosanguineous --  Treatment Cleansed --     No Linked orders to display     Data Reviewed: Basic Metabolic Panel: No results for input(s): NA, K, CL, CO2, GLUCOSE, BUN, CREATININE, CALCIUM, MG, PHOS in the last 168 hours. Liver Function Tests: No results for input(s): AST, ALT, ALKPHOS, BILITOT, PROT, ALBUMIN in the last 168 hours. No results for input(s): LIPASE, AMYLASE in the last 168 hours. No results for input(s): AMMONIA in the last 168 hours. CBC: No results for input(s): WBC, NEUTROABS, HGB, HCT, MCV, PLT in the last 168 hours. Cardiac Enzymes: No results for input(s): CKTOTAL, CKMB, CKMBINDEX, TROPONINI in the last 168 hours. BNP (last 3 results) No results for input(s): BNP in the last 8760 hours.  ProBNP (last 3 results) No results for input(s): PROBNP in the last 8760 hours.  CBG: Recent Labs  Lab 12/07/19 2020 12/08/19 0041 12/08/19 0256 12/08/19 0817 12/08/19 1215  GLUCAP 117* 108* 109* 129* 122*    No results found for this or any previous visit (from the past 240 hour(s)).   Studies: No results found.  Scheduled Meds: . amLODipine  10 mg Per Tube Daily  . chlorhexidine gluconate (MEDLINE KIT)  15 mL Mouth Rinse BID  . Chlorhexidine  Gluconate Cloth  6 each Topical Q0600  . dolutegravir  50 mg Oral Daily  . emtricitabine-tenofovir AF  1 tablet Per Tube Daily  . feeding supplement (PROSource TF)  45 mL Per Tube TID  . folic acid  1 mg Per Tube Daily  . free water  150 mL Per Tube Q4H  . heparin injection (subcutaneous)  5,000 Units Subcutaneous Q8H  . levETIRAcetam  1,500 mg Per Tube BID  . mouth rinse  15 mL Mouth Rinse 10 times per day  . nutrition supplement (JUVEN)  1 packet Oral BID BM  . scopolamine  1 patch Transdermal Q72H  . sulfamethoxazole-trimethoprim  1 tablet Per Tube Once per day on Mon Wed Fri  . thiamine  100 mg Per Tube Daily   Continuous Infusions: . feeding supplement (OSMOLITE 1.5 CAL) 1,000 mL (12/07/19 1742)    Active Problems:   Cardiac arrest [facility])   Community acquired pneumonia of left upper lobe of lung  Anoxic brain injury ([place])   Alcohol abuse   Acute respiratory failure with hypoxia ([facility])   Vomiting   Pressure injury of skin   Small bowel obstruction ([place])   Palliative care encounter   Bowel obstruction ([facility])   Chronic respiratory failure ([facility])   Diastolic dysfunction   Tracheostomy in place ([place])   Dysphagia   Protein-calorie malnutrition ([place])   Seizures ([place])   Bilateral recurrent inguinal hernia   Consultants:  PCCM  Palliative medicine  General surgery  Neurology  Interventional radiology  Procedures:  EEG  Echocardiogram  Antibiotics: Anti-infectives (From admission, onward)   Start     Dose/Rate Route Frequency Ordered Stop   11/17/19 1548  ceFAZolin (ANCEF) 2-4 GM/100ML-% IVPB       Note to Pharmacy: Domenick Bookbinder   : cabinet override      11/17/19 1548 11/18/19 0359   11/16/19 1515  ceFAZolin (ANCEF) IVPB 2g/100 mL premix        2 g 200 mL/hr over 30 Minutes Intravenous To Radiology 11/16/19 1511 11/17/19 1625   11/15/19 0900  sulfamethoxazole-trimethoprim (BACTRIM DS) 800-160 MG per tablet 1 tablet        1 tablet Per Tube Once per day on  Mon Wed Fri 11/13/19 1906     11/13/19 1615  dolutegravir (TIVICAY) tablet 50 mg        50 mg Oral Daily 11/13/19 1521     11/13/19 1615  emtricitabine-tenofovir AF (DESCOVY) 200-25 MG per tablet 1 tablet        1 tablet Per Tube Daily 11/13/19 1521     11/13/19 1530  sulfamethoxazole-trimethoprim (BACTRIM DS) 800-160 MG per tablet 1 tablet  Status:  Discontinued        1 tablet Oral Once per day on Mon Wed Fri 11/13/19 1441 11/13/19 1906   11/13/19 1500  bictegravir-emtricitabine-tenofovir AF (BIKTARVY) 50-200-25 MG per tablet 1 tablet  Status:  Discontinued        1 tablet Oral Daily 11/13/19 1403 11/13/19 1521   11/10/19 1000  erythromycin 250 mg in sodium chloride 0.9 % 100 mL IVPB        250 mg 100 mL/hr over 60 Minutes Intravenous Every 8 hours 11/10/19 0907 11/11/19 2000   11/07/19 1200  ceFEPIme (MAXIPIME) 2 g in sodium chloride 0.9 % 100 mL IVPB        2 g 200 mL/hr over 30 Minutes Intravenous Every 8 hours 11/07/19 1013 11/13/19 2127   11/03/19 1200  cefTRIAXone (ROCEPHIN) 2 g in sodium chloride 0.9 % 100 mL IVPB        2 g 200 mL/hr over 30 Minutes Intravenous Every 24 hours 11/03/19 0922 11/05/19 1427   11/02/19 0800  vancomycin (VANCOREADY) IVPB 750 mg/150 mL  Status:  Discontinued        750 mg 150 mL/hr over 60 Minutes Intravenous Every 12 hours 11/01/19 1935 11/02/19 1105   11/02/19 0600  piperacillin-tazobactam (ZOSYN) IVPB 3.375 g  Status:  Discontinued        3.375 g 12.5 mL/hr over 240 Minutes Intravenous Every 8 hours 11/02/19 0311 11/03/19 0920   11/01/19 2200  ceFEPIme (MAXIPIME) 2 g in sodium chloride 0.9 % 100 mL IVPB  Status:  Discontinued        2 g 200 mL/hr over 30 Minutes Intravenous Every 12 hours 11/01/19 2143 11/02/19 0311   11/01/19 1915  vancomycin (VANCOREADY) IVPB 1500 mg/300 mL        1,500 mg 150 mL/hr  over 120 Minutes Intravenous  Once 11/01/19 1909 11/01/19 2147   11/01/19 1845  cefTRIAXone (ROCEPHIN) 1 g in sodium chloride 0.9 % 100 mL IVPB         1 g 200 mL/hr over 30 Minutes Intravenous  Once 11/01/19 1842 11/01/19 2051   11/01/19 1845  azithromycin (ZITHROMAX) 500 mg in sodium chloride 0.9 % 250 mL IVPB        500 mg 250 mL/hr over 60 Minutes Intravenous  Once 11/01/19 1842 11/01/19 2051        Time spent: 35 minutes    Erin Hearing ANP  [facility] Pager [phone]. If 7PM-7AM, please contact night-coverage at [email or web address] 12/08/2019, 12:37 PM  LOS: 37 days

## 2019-12-09 LAB — GLUCOSE, CAPILLARY
Glucose-Capillary: 111 mg/dL — ABNORMAL HIGH (ref 70–99)
Glucose-Capillary: 113 mg/dL — ABNORMAL HIGH (ref 70–99)
Glucose-Capillary: 101 mg/dL — ABNORMAL HIGH (ref 70–99)
Glucose-Capillary: 92 mg/dL (ref 70–99)
Glucose-Capillary: 114 mg/dL — ABNORMAL HIGH (ref 70–99)
Glucose-Capillary: 108 mg/dL — ABNORMAL HIGH (ref 70–99)

## 2019-12-09 NOTE — Plan of Care (Signed)

## 2019-12-09 NOTE — Progress Notes (Signed)
PROGRESS NOTE    Carrel [place]  BFX:[id number] DOB: 06-Mar-1965 DOA: 11/01/2019 PCP: Default, Provider, MD   Chief Complain: Unresponsive  Brief Narrative: Patient is a 54 year old male with HIV, chronic alcohol abuse who was presented to the emergency department after being found unresponsive outside a convinient store.  EMS found him pulseless in PEA, unknown downtime, successfully resuscitated and brought to the emergency department.  UDS positive for THC/benzodiazepines.  CT head unremarkable.  Failed extubation trials due to severe anoxic brain injury and had tracheostomy placed.  PEG placed on 10/15.  Now the plan is for placement in a skilled nursing facility/LTAC.  Prolonged hospital course.  Difficult placement. TOC following   Assessment & Plan:   Active Problems:   Cardiac arrest [facility])   Community acquired pneumonia of left upper lobe of lung   Anoxic brain injury ([place])   Alcohol abuse   Acute respiratory failure with hypoxia (HCC)   Vomiting   Pressure injury of skin   Small bowel obstruction (HCC)   Palliative care encounter   Bowel obstruction (HCC)   Chronic respiratory failure (HCC)   Diastolic dysfunction   Status post tracheostomy ([place])   Dysphagia   Protein-calorie malnutrition (HCC)   Seizures ([place])   Bilateral recurrent inguinal hernia   Anoxic brain injury: Secondary to cardiorespiratory arrest presumed to be from alcohol/benzodiazepines overdose.  UDS positive for THC/benzodiazepines.  Elevated ethanol level on presentation.  In PEA when EMS arrived.  Successfully resuscitated.  Intubated. hospital course remarkable for persistent respiratory failure, inability for extubation.  Status post tracheostomy/PEG dependent.  Does not follow commands.  Poor prognosis for neurological recovery.  Trach  downsized ,now on trach collar.  Trach team following.  Needs referral to trach clinic on discharge.  Continue tube feeding  Hypertension: Currently blood stable.   Continue amlodipine  HIV: CD4 count of 124.  Continue HIV meds.  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  Continue Keppra  Stage II sacral pressure injury/left ankle: Not present on admission.  Wound care following.  Goals of care: Palliative care was involved during this hospitalization.  Goals of care were discussed. remains full code.  Palliative care recommended outpatient follow-up with palliative providers.  Nutrition Problem: Increased nutrient needs Etiology: acute illness      DVT prophylaxis: Heparin [place] Code Status: Full Family Communication: None at bedside  Status is: Inpatient  Remains inpatient appropriate because:Unsafe d/c plan   Dispo: The patient is from: Home              Anticipated d/c is to: LTAC vs SnF              Anticipated d/c date is: > 3 days              Patient currently is medically stable to d/c.    Consultants: PCCM,palliative care  Procedures: Intubation, tracheostomy, PEG placement  Antimicrobials:  Anti-infectives (From admission, onward)   Start     Dose/Rate Route Frequency Ordered Stop   11/17/19 1548  ceFAZolin (ANCEF) 2-4 GM/100ML-% IVPB       Note to Pharmacy: Domenick Bookbinder   : cabinet override      11/17/19 1548 11/18/19 0359   11/16/19 1515  ceFAZolin (ANCEF) IVPB 2g/100 mL premix        2 g 200 mL/hr over 30 Minutes Intravenous To Radiology 11/16/19 1511 11/17/19 1625   11/15/19 0900  sulfamethoxazole-trimethoprim (BACTRIM DS) 800-160 MG per tablet 1 tablet  1 tablet Per Tube Once per day on Mon Wed Fri 11/13/19 1906     11/13/19 1615  dolutegravir (TIVICAY) tablet 50 mg        50 mg Oral Daily 11/13/19 1521     11/13/19 1615  emtricitabine-tenofovir AF (DESCOVY) 200-25 MG per tablet 1 tablet        1 tablet Per Tube Daily 11/13/19 1521     11/13/19 1530  sulfamethoxazole-trimethoprim (BACTRIM DS) 800-160 MG per tablet 1 tablet  Status:  Discontinued        1 tablet  Oral Once per day on Mon Wed Fri 11/13/19 1441 11/13/19 1906   11/13/19 1500  bictegravir-emtricitabine-tenofovir AF (BIKTARVY) 50-200-25 MG per tablet 1 tablet  Status:  Discontinued        1 tablet Oral Daily 11/13/19 1403 11/13/19 1521   11/10/19 1000  erythromycin 250 mg in sodium chloride 0.9 % 100 mL IVPB        250 mg 100 mL/hr over 60 Minutes Intravenous Every 8 hours 11/10/19 0907 11/11/19 2000   11/07/19 1200  ceFEPIme (MAXIPIME) 2 g in sodium chloride 0.9 % 100 mL IVPB        2 g 200 mL/hr over 30 Minutes Intravenous Every 8 hours 11/07/19 1013 11/13/19 2127   11/03/19 1200  cefTRIAXone (ROCEPHIN) 2 g in sodium chloride 0.9 % 100 mL IVPB        2 g 200 mL/hr over 30 Minutes Intravenous Every 24 hours 11/03/19 0922 11/05/19 1427   11/02/19 0800  vancomycin (VANCOREADY) IVPB 750 mg/150 mL  Status:  Discontinued        750 mg 150 mL/hr over 60 Minutes Intravenous Every 12 hours 11/01/19 1935 11/02/19 1105   11/02/19 0600  piperacillin-tazobactam (ZOSYN) IVPB 3.375 g  Status:  Discontinued        3.375 g 12.5 mL/hr over 240 Minutes Intravenous Every 8 hours 11/02/19 0311 11/03/19 0920   11/01/19 2200  ceFEPIme (MAXIPIME) 2 g in sodium chloride 0.9 % 100 mL IVPB  Status:  Discontinued        2 g 200 mL/hr over 30 Minutes Intravenous Every 12 hours 11/01/19 2143 11/02/19 0311   11/01/19 1915  vancomycin (VANCOREADY) IVPB 1500 mg/300 mL        1,500 mg 150 mL/hr over 120 Minutes Intravenous  Once 11/01/19 1909 11/01/19 2147   11/01/19 1845  cefTRIAXone (ROCEPHIN) 1 g in sodium chloride 0.9 % 100 mL IVPB        1 g 200 mL/hr over 30 Minutes Intravenous  Once 11/01/19 1842 11/01/19 2051   11/01/19 1845  azithromycin (ZITHROMAX) 500 mg in sodium chloride 0.9 % 250 mL IVPB        500 mg 250 mL/hr over 60 Minutes Intravenous  Once 11/01/19 1842 11/01/19 2051      Subjective:  Patient seen and examined the bedside this morning.  Hemodynamically stable.  Looks comfortable.  No new  changes from yesterday.  Objective: Vitals:   12/08/19 2341 12/09/19 0311 12/09/19 0459 12/09/19 0500  BP:   (!) 158/72   Pulse: 86 79 88   Resp: 16  19   Temp:   97.8 F (36.6 C)   TempSrc:   Axillary   SpO2: 97% 98% 97%   Weight:    73.3 kg  Height:        Intake/Output Summary (Last 24 hours) at 12/09/2019 8144 Last data filed at 12/08/2019 1523 Gross per 24 hour  Intake --  Output 450 ml  Net -450 ml   Filed Weights   12/06/19 0001 12/06/19 0120 12/09/19 0500  Weight: 75.1 kg 75.1 kg 73.3 kg    Examination:   General exam: Overall comfortable HEENT: Trach collar Respiratory system: Bilateral equal air entry, normal vesicular breath sounds, no wheezes or crackles  Cardiovascular system: S1 & S2 heard, RRR. No JVD, murmurs, rubs, gallops or clicks. Gastrointestinal system: Abdomen is nondistended, soft and nontender. No organomegaly or masses felt. Normal bowel sounds heard.  PEG Central nervous system: Alert and awake but not oriented.  Does not follow commands  extremities: No edema, no clubbing ,no cyanosis, crusted skin  skin: pressure ulcers as above ,no icterus ,no pallor     Data Reviewed: I have personally reviewed following labs and imaging studies  CBC: No results for input(s): WBC, NEUTROABS, HGB, HCT, MCV, PLT in the last 168 hours. Basic Metabolic Panel: No results for input(s): NA, K, CL, CO2, GLUCOSE, BUN, CREATININE, CALCIUM, MG, PHOS in the last 168 hours. GFR: Estimated Creatinine Clearance: 94.1 mL/min (by C-G formula based on SCr of 0.75 mg/dL). Liver Function Tests: No results for input(s): AST, ALT, ALKPHOS, BILITOT, PROT, ALBUMIN in the last 168 hours. No results for input(s): LIPASE, AMYLASE in the last 168 hours. No results for input(s): AMMONIA in the last 168 hours. Coagulation Profile: No results for input(s): INR, PROTIME in the last 168 hours. Cardiac Enzymes: No results for input(s): CKTOTAL, CKMB, CKMBINDEX, TROPONINI in the  last 168 hours. BNP (last 3 results) No results for input(s): PROBNP in the last 8760 hours. HbA1C: No results for input(s): HGBA1C in the last 72 hours. CBG: Recent Labs  Lab 12/08/19 1718 12/08/19 1941 12/08/19 2323 12/09/19 0318 12/09/19 0734  GLUCAP 111* 100* 95 111* 113*   Lipid Profile: No results for input(s): CHOL, HDL, LDLCALC, TRIG, CHOLHDL, LDLDIRECT in the last 72 hours. Thyroid Function Tests: No results for input(s): TSH, T4TOTAL, FREET4, T3FREE, THYROIDAB in the last 72 hours. Anemia Panel: No results for input(s): VITAMINB12, FOLATE, FERRITIN, TIBC, IRON, RETICCTPCT in the last 72 hours. Sepsis Labs: No results for input(s): PROCALCITON, LATICACIDVEN in the last 168 hours.  No results found for this or any previous visit (from the past 240 hour(s)).       Radiology Studies: No results found.      Scheduled Meds: . amLODipine  10 mg Per Tube Daily  . chlorhexidine gluconate (MEDLINE KIT)  15 mL Mouth Rinse BID  . Chlorhexidine Gluconate Cloth  6 each Topical Q0600  . dolutegravir  50 mg Oral Daily  . emtricitabine-tenofovir AF  1 tablet Per Tube Daily  . feeding supplement (PROSource TF)  45 mL Per Tube TID  . folic acid  1 mg Per Tube Daily  . free water  200 mL Per Tube Q4H  . heparin injection (subcutaneous)  5,000 Units Subcutaneous Q8H  . levETIRAcetam  1,500 mg Per Tube BID  . mouth rinse  15 mL Mouth Rinse 10 times per day  . nutrition supplement (JUVEN)  1 packet Oral BID BM  . scopolamine  1 patch Transdermal Q72H  . sulfamethoxazole-trimethoprim  1 tablet Per Tube Once per day on Mon Wed Fri  . thiamine  100 mg Per Tube Daily   Continuous Infusions: . feeding supplement (OSMOLITE 1.5 CAL) 1,000 mL (12/08/19 1337)     LOS: 38 days    Time spent: 15 mins.More than 50% of that time was spent in counseling and/or coordination of  care.      Shelly Coss, MD [facility] P11/07/2019, 8:22 AM

## 2019-12-09 NOTE — Progress Notes (Signed)
Patient was found without trach collar, patient placed back on trach collar 21% humidified room air. Patient has a moderate amount of tan, thick secretions and requires humidification to prevent mucous plugging. RT will continue to monitor

## 2019-12-10 LAB — GLUCOSE, CAPILLARY
Glucose-Capillary: 90 mg/dL (ref 70–99)
Glucose-Capillary: 111 mg/dL — ABNORMAL HIGH (ref 70–99)
Glucose-Capillary: 101 mg/dL — ABNORMAL HIGH (ref 70–99)
Glucose-Capillary: 114 mg/dL — ABNORMAL HIGH (ref 70–99)
Glucose-Capillary: 95 mg/dL (ref 70–99)
Glucose-Capillary: 93 mg/dL (ref 70–99)

## 2019-12-10 NOTE — Progress Notes (Addendum)
PROGRESS NOTE    [place]  SWN:[id number] DOB: 07/11/1965 DOA: 11/01/2019 PCP: Default, Provider, MD   Chief Complain: Unresponsive  Brief Narrative: Patient is a 54 year old male with HIV, chronic alcohol abuse who was presented to the emergency department after being found unresponsive outside a convinient store.  EMS found him pulseless in PEA, unknown downtime, successfully resuscitated and brought to the emergency department.  UDS positive for THC/benzodiazepines.  CT head unremarkable.  Failed extubation trials due to severe anoxic brain injury and had tracheostomy placed.  PEG placed on 10/15.  Now the plan is for placement in a skilled nursing facility/LTAC.  Prolonged hospital course.  Difficult placement. TOC following   Assessment & Plan:   Active Problems:   Cardiac arrest [facility])   Community acquired pneumonia of left upper lobe of lung   Anoxic brain injury ([place])   Alcohol abuse   Acute respiratory failure with hypoxia (HCC)   Vomiting   Pressure injury of skin   Small bowel obstruction (HCC)   Palliative care encounter   Bowel obstruction (HCC)   Chronic respiratory failure (HCC)   Diastolic dysfunction   Status post tracheostomy ([place])   Dysphagia   Protein-calorie malnutrition (HCC)   Seizures ([place])   Bilateral recurrent inguinal hernia   Anoxic brain injury: Secondary to cardiorespiratory arrest presumed to be from alcohol/benzodiazepines overdose.  UDS positive for THC/benzodiazepines.  Elevated ethanol level on presentation.  In PEA when EMS arrived.  Successfully resuscitated.  Intubated. hospital course remarkable for persistent respiratory failure, inability for extubation.  Status post tracheostomy/PEG dependent.  Does not follow commands.  Poor prognosis for neurological recovery.  Trach  downsized ,now on trach collar.  Trach team following.  Needs referral to trach clinic on discharge.  Continue tube feeding Trach needs frequent  suctioning/humidification because of mucus plugging.  Hypertension: Currently blood stable.  Continue amlodipine  HIV: CD4 count of 124.  Continue HIV meds.  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  Continue Keppra  Stage II sacral pressure injury/left ankle: Not present on admission.  Wound care following.  Goals of care: Palliative care was involved during this hospitalization.  Goals of care were discussed. remains full code.  Palliative care recommended outpatient follow-up with palliative providers.  Nutrition Problem: Increased nutrient needs Etiology: acute illness      DVT prophylaxis: Heparin [place] Code Status: Full Family Communication: None at bedside  Status is: Inpatient  Remains inpatient appropriate because:Unsafe d/c plan   Dispo: The patient is from: Home              Anticipated d/c is to: LTAC vs SnF              Anticipated d/c date is: > 3 days              Patient currently is medically stable to d/c.    Consultants: PCCM,palliative care  Procedures: Intubation, tracheostomy, PEG placement  Antimicrobials:  Anti-infectives (From admission, onward)   Start     Dose/Rate Route Frequency Ordered Stop   11/17/19 1548  ceFAZolin (ANCEF) 2-4 GM/100ML-% IVPB       Note to Pharmacy: Domenick Bookbinder   : cabinet override      11/17/19 1548 11/18/19 0359   11/16/19 1515  ceFAZolin (ANCEF) IVPB 2g/100 mL premix        2 g 200 mL/hr over 30 Minutes Intravenous To Radiology 11/16/19 1511 11/17/19 1625   11/15/19 0900  sulfamethoxazole-trimethoprim (  BACTRIM DS) 800-160 MG per tablet 1 tablet        1 tablet Per Tube Once per day on Mon Wed Fri 11/13/19 1906     11/13/19 1615  dolutegravir (TIVICAY) tablet 50 mg        50 mg Oral Daily 11/13/19 1521     11/13/19 1615  emtricitabine-tenofovir AF (DESCOVY) 200-25 MG per tablet 1 tablet        1 tablet Per Tube Daily 11/13/19 1521     11/13/19 1530   sulfamethoxazole-trimethoprim (BACTRIM DS) 800-160 MG per tablet 1 tablet  Status:  Discontinued        1 tablet Oral Once per day on Mon Wed Fri 11/13/19 1441 11/13/19 1906   11/13/19 1500  bictegravir-emtricitabine-tenofovir AF (BIKTARVY) 50-200-25 MG per tablet 1 tablet  Status:  Discontinued        1 tablet Oral Daily 11/13/19 1403 11/13/19 1521   11/10/19 1000  erythromycin 250 mg in sodium chloride 0.9 % 100 mL IVPB        250 mg 100 mL/hr over 60 Minutes Intravenous Every 8 hours 11/10/19 0907 11/11/19 2000   11/07/19 1200  ceFEPIme (MAXIPIME) 2 g in sodium chloride 0.9 % 100 mL IVPB        2 g 200 mL/hr over 30 Minutes Intravenous Every 8 hours 11/07/19 1013 11/13/19 2127   11/03/19 1200  cefTRIAXone (ROCEPHIN) 2 g in sodium chloride 0.9 % 100 mL IVPB        2 g 200 mL/hr over 30 Minutes Intravenous Every 24 hours 11/03/19 0922 11/05/19 1427   11/02/19 0800  vancomycin (VANCOREADY) IVPB 750 mg/150 mL  Status:  Discontinued        750 mg 150 mL/hr over 60 Minutes Intravenous Every 12 hours 11/01/19 1935 11/02/19 1105   11/02/19 0600  piperacillin-tazobactam (ZOSYN) IVPB 3.375 g  Status:  Discontinued        3.375 g 12.5 mL/hr over 240 Minutes Intravenous Every 8 hours 11/02/19 0311 11/03/19 0920   11/01/19 2200  ceFEPIme (MAXIPIME) 2 g in sodium chloride 0.9 % 100 mL IVPB  Status:  Discontinued        2 g 200 mL/hr over 30 Minutes Intravenous Every 12 hours 11/01/19 2143 11/02/19 0311   11/01/19 1915  vancomycin (VANCOREADY) IVPB 1500 mg/300 mL        1,500 mg 150 mL/hr over 120 Minutes Intravenous  Once 11/01/19 1909 11/01/19 2147   11/01/19 1845  cefTRIAXone (ROCEPHIN) 1 g in sodium chloride 0.9 % 100 mL IVPB        1 g 200 mL/hr over 30 Minutes Intravenous  Once 11/01/19 1842 11/01/19 2051   11/01/19 1845  azithromycin (ZITHROMAX) 500 mg in sodium chloride 0.9 % 250 mL IVPB        500 mg 250 mL/hr over 60 Minutes Intravenous  Once 11/01/19 1842 11/01/19 2051       Subjective:  Patient seen and examined the bedside this morning.  No significant change from yesterday.   Objective: Vitals:   12/10/19 0302 12/10/19 0415 12/10/19 0418 12/10/19 0756  BP:   123/81   Pulse: 75  87   Resp: 18  18   Temp:   98.9 F (37.2 C)   TempSrc:   Axillary   SpO2: 98%  98% 97%  Weight:  75.4 kg    Height:        Intake/Output Summary (Last 24 hours) at 12/10/2019 0962 Last data filed  at 12/10/2019 0420 Gross per 24 hour  Intake --  Output 1550 ml  Net -1550 ml   Filed Weights   12/06/19 0120 12/09/19 0500 12/10/19 0415  Weight: 75.1 kg 73.3 kg 75.4 kg    Examination:   General exam: Not in distress, debilitated, deconditioned HEENT: Trach collar Respiratory system: Bilateral equal air entry, normal vesicular breath sounds, no wheezes or crackles  Cardiovascular system: S1 & S2 heard, RRR. No JVD, murmurs, rubs, gallops or clicks. Gastrointestinal system: Abdomen is nondistended, soft and nontender. No organomegaly or masses felt. Normal bowel sounds heard.  PEG Central nervous system: Not alert or oriented.  Opens his eyes on calling his name Extremities: No edema, no clubbing ,no cyanosis Skin: pressure  ulcers,no icterus , crusted skin    Data Reviewed: I have personally reviewed following labs and imaging studies  CBC: No results for input(s): WBC, NEUTROABS, HGB, HCT, MCV, PLT in the last 168 hours. Basic Metabolic Panel: No results for input(s): NA, K, CL, CO2, GLUCOSE, BUN, CREATININE, CALCIUM, MG, PHOS in the last 168 hours. GFR: Estimated Creatinine Clearance: 94.1 mL/min (by C-G formula based on SCr of 0.75 mg/dL). Liver Function Tests: No results for input(s): AST, ALT, ALKPHOS, BILITOT, PROT, ALBUMIN in the last 168 hours. No results for input(s): LIPASE, AMYLASE in the last 168 hours. No results for input(s): AMMONIA in the last 168 hours. Coagulation Profile: No results for input(s): INR, PROTIME in the last 168  hours. Cardiac Enzymes: No results for input(s): CKTOTAL, CKMB, CKMBINDEX, TROPONINI in the last 168 hours. BNP (last 3 results) No results for input(s): PROBNP in the last 8760 hours. HbA1C: No results for input(s): HGBA1C in the last 72 hours. CBG: Recent Labs  Lab 12/09/19 1702 12/09/19 1954 12/09/19 2310 12/10/19 0307 12/10/19 0745  GLUCAP 92 114* 108* 90 111*   Lipid Profile: No results for input(s): CHOL, HDL, LDLCALC, TRIG, CHOLHDL, LDLDIRECT in the last 72 hours. Thyroid Function Tests: No results for input(s): TSH, T4TOTAL, FREET4, T3FREE, THYROIDAB in the last 72 hours. Anemia Panel: No results for input(s): VITAMINB12, FOLATE, FERRITIN, TIBC, IRON, RETICCTPCT in the last 72 hours. Sepsis Labs: No results for input(s): PROCALCITON, LATICACIDVEN in the last 168 hours.  No results found for this or any previous visit (from the past 240 hour(s)).       Radiology Studies: No results found.      Scheduled Meds: . amLODipine  10 mg Per Tube Daily  . chlorhexidine gluconate (MEDLINE KIT)  15 mL Mouth Rinse BID  . Chlorhexidine Gluconate Cloth  6 each Topical Q0600  . dolutegravir  50 mg Oral Daily  . emtricitabine-tenofovir AF  1 tablet Per Tube Daily  . feeding supplement (PROSource TF)  45 mL Per Tube TID  . folic acid  1 mg Per Tube Daily  . free water  200 mL Per Tube Q4H  . heparin injection (subcutaneous)  5,000 Units Subcutaneous Q8H  . levETIRAcetam  1,500 mg Per Tube BID  . mouth rinse  15 mL Mouth Rinse 10 times per day  . nutrition supplement (JUVEN)  1 packet Oral BID BM  . scopolamine  1 patch Transdermal Q72H  . sulfamethoxazole-trimethoprim  1 tablet Per Tube Once per day on Mon Wed Fri  . thiamine  100 mg Per Tube Daily   Continuous Infusions: . feeding supplement (OSMOLITE 1.5 CAL) 1,000 mL (12/08/19 1337)     LOS: 39 days    Time spent: 15 mins.More than 50% of  that time was spent in counseling and/or coordination of  care.      Shelly Coss, MD [facility] P11/08/2019, 8:22 AM

## 2019-12-11 DIAGNOSIS — R1312 Dysphagia, oropharyngeal phase: Secondary | ICD-10-CM

## 2019-12-11 DIAGNOSIS — Z930 Tracheostomy status: Secondary | ICD-10-CM

## 2019-12-11 LAB — GLUCOSE, CAPILLARY
Glucose-Capillary: 117 mg/dL — ABNORMAL HIGH (ref 70–99)
Glucose-Capillary: 135 mg/dL — ABNORMAL HIGH (ref 70–99)
Glucose-Capillary: 101 mg/dL — ABNORMAL HIGH (ref 70–99)
Glucose-Capillary: 104 mg/dL — ABNORMAL HIGH (ref 70–99)
Glucose-Capillary: 113 mg/dL — ABNORMAL HIGH (ref 70–99)
Glucose-Capillary: 94 mg/dL (ref 70–99)

## 2019-12-11 NOTE — Progress Notes (Signed)
  Speech Language Pathology Treatment: Cognitive-Linquistic  Patient Details Name: Ryan Orozco MRN: [id number] DOB: Jan 05, 1966 Today's Date: 12/11/2019 Time: 3559-7416 SLP Time Calculation (min) (ACUTE ONLY): 30 min  Assessment / Plan / Recommendation Clinical Impression  Co-treatment with PT with pt sitting edge of bed for approximately 20 min. Great progress toward goals today and pt localized his responses toward stimuli. Focuses attention to speaker when name called and sustains eye contact for several seconds. Significant right inattention. He followed one step commands 90% with verbal and visual cues. Arm/shoulder/hand spasticity limited command following to manipulate objects. Secretions were not copious but thick and mobilized to oral cavity for suctioning. PMV donned for 22 minutes with no appreciable difficulty. He verbally responded to command for verbal response twice- unintelligible; vowel sounds. Great interaction with therapists today.    HPI HPI: 54 y/o male with hx of HIV, ETOH abuse found down outside a convenience store on 9/27, received out of hospital CPR. Presents with acute hypoxic respiratory failure s/p cardiac arrest, acute encephalopathy due to hypoxic brain injury. Intubated 9/27-10/13. Received a tracheostomy 10/13, treachestomy collar trial for 9 hours on 10/16. CXR on 10/17 improving and shows left lung opacities have essentially resolved.      SLP Plan  Continue with current plan of care       Recommendations         Patient may use Passy-Muir Speech Valve: with SLP only PMSV Supervision: Full         Oral Care Recommendations: Oral care QID Follow up Recommendations: LTACH;Skilled Nursing facility SLP Visit Diagnosis: Aphonia (R49.1);Cognitive communication deficit (L84.536) Plan: Continue with current plan of care       GO                Royce Macadamia 12/11/2019, 9:38 AM

## 2019-12-11 NOTE — Progress Notes (Signed)
[facility] PROGRESS NOTE  Ryan Orozco VHQ:[id number] DOB: 11-Jul-1965 DOA: 11/01/2019 PCP: Default, Provider, MD  Status: Remains inpatient appropriate because:Altered mental status, Unsafe d/c plan and Inpatient level of care appropriate due to severity of illness. Patient newly diagnosed with anoxic brain injury. Still has excessive tracheal secretions requiring frequent suctioning- has just started PMV training.  Dispo: The patient is from: Home              Anticipated d/c is to: SNF              Anticipated d/c date is: > 3 days              Patient currently is not medically stable to d/c. Patient currently requiring frequent suctioning therefore at this time is not a candidate for discharge. Have also just begun PMV training. Medicaid application in process. Patient will also need to have disability paperwork completed since do not expect full recovery   Code Status: Full Family Communication: No family at bedside DVT prophylaxis: Subcutaneous heparin Vaccination status: Does not appear to have received Covid vaccine but will need to confirm with family  Foley catheter: No  HPI: 54 year old male with HIV, chronic alcohol abuse who was presented to the emergency department after being found unresponsive outside a convinient store.  EMS found him pulseless in PEA, unknown downtime, successfully resuscitated and brought to the emergency department.  UDS positive for THC/benzodiazepines.  CT head unremarkable.  Failed extubation trials due to severe anoxic brain injury and had tracheostomy placed.  PEG placed on 10/15.  Now the plan is for placement in a skilled nursing facility/LTAC.  Prolonged hospital course.  Difficult placement. TOC following  Subjective: Sister [name] at bedside.  States patient is sleepier than usual.  Patient having difficulty following commands due to lethargy.  Modeled sticking out my tongue and patient was able to stick his tongue  out.  Objective: Vitals:   12/11/19 0726 12/11/19 0940  BP:    Pulse: 83   Resp: 18   Temp:    SpO2: 95% 97%    Intake/Output Summary (Last 24 hours) at 12/11/2019 1134 Last data filed at 12/10/2019 1700 Gross per 24 hour  Intake --  Output 1100 ml  Net -1100 ml   Filed Weights   12/09/19 0500 12/10/19 0415 12/11/19 0358  Weight: 73.3 kg 75.4 kg 72.6 kg    Exam: Constitutional: NAD, calm, appears to be comfortable Eyes: PERRL, lids and conjunctivae normal, appears to have mild bilateral disconjugate gaze when eyes are open Respiratory: Coarse to auscultation bilaterally, #6 cuffless trach in place with thick creamy yellow secretions noted, trach collar in place 28% FiO2 Cardiovascular: Regular rate and rhythm, no murmurs / rubs / gallops. No extremity edema. 2+ pedal pulses. No carotid bruits.  Abdomen: no tenderness, PEG tube with tube feeding infusing, bowel sounds positive.  Musculoskeletal: no clubbing / cyanosis. No joint deformity upper and lower extremities. no contractures no extremity spasticity detected. Decreased muscle tone both upper and lower extremities although was able to lift both legs independently off the bed about 2 inches.  Skin: Several decubiti as described below Neurologic: CN 2-12 grossly intact appears to have mild bilateral disconjugate gaze. Sensation appears to be intact both upper and lower extremities, difficult to test strength in upper extremities but appears to be 2/5 with lower extremities 2-3/5 Psychiatric: Alert and oriented x name only.  More lethargic when I evaluated patient but apparently later in the morning was more  alert and interactive with therapy staff   Assessment/Plan: Anoxic brain injury 2/2 PEA arrest:  -Presumed 2/2 alcohol/benzodiazepines overdose.  UDS positive for THC/benzodiazepines.  Elevated ethanol level on presentation. -Successfully resuscitated.    -Suspect lifelong sequelae from this event and do not expect patient  to recover to baseline mentation; long-term placement in SNF expected -Continue OT/PT/SLP-11/8 patient more alert and able to participate with therapies documented improvement in cognition and mobility  Acute hypoxemic respiratory failure/new tracheostomy tube requirement -Continues with thick pale yellow secretions; no fevers or other signs of active pneumonia although could have chronic tracheobronchitis-need to consider obtaining sputum culture; tracheal aspirate 10/5 unremarkable -He has #6 cuffless trach and SLP assisting with PMV training -Trach team following peripherally  Dysphagia 2/2 anoxic brain injury -Once tolerating PMV can assess swallowing more thoroughly -Continue tube feeding per PEG  Hypertension/grade 1 diastolic dysfunction:  -Currently blood stable.   -Continue amlodipine -Echocardiogram this admission with preserved LVEF with evidence of grade 1 diastolic dysfunction. No physiology currently consistent with heart failure  HIV:  -CD4 count of 124.  -Continue HIV meds.   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   -Continue Keppra -Continue seizure precautions  Inguinal hernias -Evaluated by general surgery this admission. Documented as chronically incarcerated. Patient has been clinically stable and tolerating tube feedings and being bowel movements so unless patient obstructed would not be considered a candidate for repair. Even if obstructed consulting surgeon was not sure he would be a candidate for hernia repair regardless  Goals of care:  -Palliative care was involved during this hospitalization.   -Goals of care were discussed. remains full code.  - Palliative care recommended outpatient follow-up with palliative providers.  Protein calorie malnutrition: Nutrition Status: Nutrition Problem: Increased nutrient needs Etiology: acute illness Signs/Symptoms: estimated needs Interventions: Tube  feeding Estimated body mass index is 25.83 kg/m as calculated from the following:   Height as of this encounter: 5' 6"  (1.676 m).   Weight as of this encounter: 72.6 kg.  Multiple decubitus ulcers not POA Pressure Injury 11/13/19 Anus Stage 3 -  Full thickness tissue loss. Subcutaneous fat may be visible but bone, tendon or muscle are NOT exposed. (Active)  Date First Assessed/Time First Assessed: 11/13/19 1000   Location: Anus  Staging: Stage 3 -  Full thickness tissue loss. Subcutaneous fat may be visible but bone, tendon or muscle are NOT exposed.  Present on Admission: No    Assessments 11/17/2019  8:00 AM 12/06/2019 10:00 AM  Dressing Type None --  Site / Wound Assessment Pink --  Peri-wound Assessment Intact --  Wound Length (cm) -- 1 cm  Wound Width (cm) -- 0.5 cm  Wound Depth (cm) -- 0 cm  Wound Surface Area (cm^2) -- 0.5 cm^2  Wound Volume (cm^3) -- 0 cm^3  Drainage Amount None --     No Linked orders to display     Wound / Incision (Open or Dehisced) 11/17/19 Puncture Abdomen Left;Anterior;Upper G-tube insertion site  (Active)  Date First Assessed/Time First Assessed: 11/17/19 1646   Wound Type: Puncture  Location: Abdomen  Location Orientation: Left;Anterior;Upper  Wound Description (Comments): G-tube insertion site   Present on Admission: No    Assessments 11/17/2019  4:47 PM 12/06/2019 10:00 AM  Dressing Type Gauze (Comment);Tape dressing --  Dressing Changed New --  Dressing Status Clean;Dry;Intact --  Dressing Change Frequency PRN --  Site / Wound Assessment Clean;Dry;Pink --  Peri-wound Assessment Intact --  Wound  Length (cm) -- 0 cm  Wound Width (cm) -- 0 cm  Wound Depth (cm) -- 0 cm  Wound Volume (cm^3) -- 0 cm^3  Wound Surface Area (cm^2) -- 0 cm^2  Closure None --  Drainage Amount None --  Treatment Cleansed --     No Linked orders to display     Pressure Injury 12/01/19 Ankle Left;Lateral Stage 2 -  Partial thickness loss of dermis presenting as a  shallow open injury with a red, pink wound bed without slough. (Active)  Date First Assessed/Time First Assessed: 12/01/19 2002   Location: Ankle  Location Orientation: Left;Lateral  Staging: Stage 2 -  Partial thickness loss of dermis presenting as a shallow open injury with a red, pink wound bed without slough.    Assessments 12/01/2019  7:52 PM 12/06/2019 10:00 AM  Dressing Type Foam - Lift dressing to assess site every shift --  Dressing Clean;Dry;Intact --  Site / Wound Assessment Pink --  Peri-wound Assessment Intact --  Wound Length (cm) -- 2.5 cm  Wound Width (cm) -- 0.5 cm  Wound Depth (cm) -- 0 cm  Wound Surface Area (cm^2) -- 1.25 cm^2  Wound Volume (cm^3) -- 0 cm^3  Margins Unattached edges (unapproximated) --  Drainage Amount Scant --  Drainage Description Serosanguineous --  Treatment Cleansed --     No Linked orders to display     Data Reviewed: Basic Metabolic Panel: No results for input(s): NA, K, CL, CO2, GLUCOSE, BUN, CREATININE, CALCIUM, MG, PHOS in the last 168 hours. Liver Function Tests: No results for input(s): AST, ALT, ALKPHOS, BILITOT, PROT, ALBUMIN in the last 168 hours. No results for input(s): LIPASE, AMYLASE in the last 168 hours. No results for input(s): AMMONIA in the last 168 hours. CBC: No results for input(s): WBC, NEUTROABS, HGB, HCT, MCV, PLT in the last 168 hours. Cardiac Enzymes: No results for input(s): CKTOTAL, CKMB, CKMBINDEX, TROPONINI in the last 168 hours. BNP (last 3 results) No results for input(s): BNP in the last 8760 hours.  ProBNP (last 3 results) No results for input(s): PROBNP in the last 8760 hours.  CBG: Recent Labs  Lab 12/10/19 1725 12/10/19 2108 12/10/19 2315 12/11/19 0257 12/11/19 0813  GLUCAP 114* 95 93 117* 135*    No results found for this or any previous visit (from the past 240 hour(s)).   Studies: No results found.  Scheduled Meds: . amLODipine  10 mg Per Tube Daily  . chlorhexidine gluconate  (MEDLINE KIT)  15 mL Mouth Rinse BID  . Chlorhexidine Gluconate Cloth  6 each Topical Q0600  . dolutegravir  50 mg Oral Daily  . emtricitabine-tenofovir AF  1 tablet Per Tube Daily  . feeding supplement (PROSource TF)  45 mL Per Tube TID  . folic acid  1 mg Per Tube Daily  . free water  200 mL Per Tube Q4H  . heparin injection (subcutaneous)  5,000 Units Subcutaneous Q8H  . levETIRAcetam  1,500 mg Per Tube BID  . mouth rinse  15 mL Mouth Rinse 10 times per day  . nutrition supplement (JUVEN)  1 packet Oral BID BM  . scopolamine  1 patch Transdermal Q72H  . sulfamethoxazole-trimethoprim  1 tablet Per Tube Once per day on Mon Wed Fri  . thiamine  100 mg Per Tube Daily   Continuous Infusions: . feeding supplement (OSMOLITE 1.5 CAL) 1,000 mL (12/08/19 1337)    Active Problems:   Cardiac arrest [facility])   Community acquired pneumonia of left upper  lobe of lung   Anoxic brain injury ([place])   Alcohol abuse   Acute respiratory failure with hypoxia ([facility])   Vomiting   Pressure injury of skin   Small bowel obstruction ([facility])   Palliative care encounter   Bowel obstruction ([facility])   Chronic respiratory failure ([facility])   Diastolic dysfunction   Status post tracheostomy ([place])   Dysphagia   Protein-calorie malnutrition ([place])   Seizures ([place])   Bilateral recurrent inguinal hernia   Consultants:  PCCM  Palliative medicine  General surgery  Neurology  Interventional radiology  Procedures:  EEG  Echocardiogram  Antibiotics: Anti-infectives (From admission, onward)   Start     Dose/Rate Route Frequency Ordered Stop   11/17/19 1548  ceFAZolin (ANCEF) 2-4 GM/100ML-% IVPB       Note to Pharmacy: Domenick Bookbinder   : cabinet override      11/17/19 1548 11/18/19 0359   11/16/19 1515  ceFAZolin (ANCEF) IVPB 2g/100 mL premix        2 g 200 mL/hr over 30 Minutes Intravenous To Radiology 11/16/19 1511 11/17/19 1625   11/15/19 0900  sulfamethoxazole-trimethoprim (BACTRIM DS) 800-160 MG per  tablet 1 tablet        1 tablet Per Tube Once per day on Mon Wed Fri 11/13/19 1906     11/13/19 1615  dolutegravir (TIVICAY) tablet 50 mg        50 mg Oral Daily 11/13/19 1521     11/13/19 1615  emtricitabine-tenofovir AF (DESCOVY) 200-25 MG per tablet 1 tablet        1 tablet Per Tube Daily 11/13/19 1521     11/13/19 1530  sulfamethoxazole-trimethoprim (BACTRIM DS) 800-160 MG per tablet 1 tablet  Status:  Discontinued        1 tablet Oral Once per day on Mon Wed Fri 11/13/19 1441 11/13/19 1906   11/13/19 1500  bictegravir-emtricitabine-tenofovir AF (BIKTARVY) 50-200-25 MG per tablet 1 tablet  Status:  Discontinued        1 tablet Oral Daily 11/13/19 1403 11/13/19 1521   11/10/19 1000  erythromycin 250 mg in sodium chloride 0.9 % 100 mL IVPB        250 mg 100 mL/hr over 60 Minutes Intravenous Every 8 hours 11/10/19 0907 11/11/19 2000   11/07/19 1200  ceFEPIme (MAXIPIME) 2 g in sodium chloride 0.9 % 100 mL IVPB        2 g 200 mL/hr over 30 Minutes Intravenous Every 8 hours 11/07/19 1013 11/13/19 2127   11/03/19 1200  cefTRIAXone (ROCEPHIN) 2 g in sodium chloride 0.9 % 100 mL IVPB        2 g 200 mL/hr over 30 Minutes Intravenous Every 24 hours 11/03/19 0922 11/05/19 1427   11/02/19 0800  vancomycin (VANCOREADY) IVPB 750 mg/150 mL  Status:  Discontinued        750 mg 150 mL/hr over 60 Minutes Intravenous Every 12 hours 11/01/19 1935 11/02/19 1105   11/02/19 0600  piperacillin-tazobactam (ZOSYN) IVPB 3.375 g  Status:  Discontinued        3.375 g 12.5 mL/hr over 240 Minutes Intravenous Every 8 hours 11/02/19 0311 11/03/19 0920   11/01/19 2200  ceFEPIme (MAXIPIME) 2 g in sodium chloride 0.9 % 100 mL IVPB  Status:  Discontinued        2 g 200 mL/hr over 30 Minutes Intravenous Every 12 hours 11/01/19 2143 11/02/19 0311   11/01/19 1915  vancomycin (VANCOREADY) IVPB 1500 mg/300 mL  1,500 mg 150 mL/hr over 120 Minutes Intravenous  Once 11/01/19 1909 11/01/19 2147   11/01/19 1845   cefTRIAXone (ROCEPHIN) 1 g in sodium chloride 0.9 % 100 mL IVPB        1 g 200 mL/hr over 30 Minutes Intravenous  Once 11/01/19 1842 11/01/19 2051   11/01/19 1845  azithromycin (ZITHROMAX) 500 mg in sodium chloride 0.9 % 250 mL IVPB        500 mg 250 mL/hr over 60 Minutes Intravenous  Once 11/01/19 1842 11/01/19 2051       Time spent: 25 minutes    Erin Hearing ANP  [facility] Pager [phone]. If 7PM-7AM, please contact night-coverage at [email or web address] 12/11/2019, 11:34 AM  LOS: 40 days

## 2019-12-11 NOTE — Progress Notes (Signed)
Physical Therapy Treatment Patient Details Name: Ryan Orozco MRN: [id number] DOB: July 03, 1965 Today's Date: 12/11/2019    History of Present Illness 54 year old male found down outside convenience store 9/27. Pt with PEA and hypoxic brain injury. Trach 10/13, PEG 10/15. PMHx: HIV AIDS and alcohol abuse    PT Comments    Pt seen in conjunction with SLP this session with sitting balance and progression of activity to EOB 20 min with use of PMSV and pt able to inconsistently respond to multimodal cues to participate in moving bil LE, RUE and mouth movements for oral care and suction. Pt smiling appropriately throughout session and needs max assist at times to direct vision and extend neck to attend to speaker. Pt calm and pleasant throughout with significant improvement in cognition and mobility. bil mittens reapplied end of session.   SpO2 95-97% on RA throughout session With return to trach collar 21% end of session   Follow Up Recommendations  Supervision/Assistance - 24 hour;SNF     Equipment Recommendations  Wheelchair (measurements PT);Wheelchair cushion (measurements PT);Hospital bed    Recommendations for Other Services       Precautions / Restrictions Precautions Precautions: Fall Precaution Comments: trach, PEG, anoxic, flexor tone LUE    Mobility  Bed Mobility Overal bed mobility: Needs Assistance Bed Mobility: Supine to Sit;Sit to Supine     Supine to sit: Max assist;+2 for safety/equipment Sit to supine: Max assist;+2 for safety/equipment   General bed mobility comments: max assist to bring legs off of surface and +2 assist to lift trunk into sitting. Return to bed with assist for all aspects and total +2 to return to bed  Transfers                 General transfer comment: did not attempt  Ambulation/Gait                 Stairs             Wheelchair Mobility    Modified Rankin (Stroke Patients Only)       Balance Overall  balance assessment: Needs assistance   Sitting balance-Leahy Scale: Poor Sitting balance - Comments: able to sit EOB 20 min with variation from min guard up to 5 seconds to max assist. requires tactile cues and up to max assist with spontaneous posterior and anterior propulsion needing mod-max assist to correct to midline. physical assist at times to extend neck                                    Cognition Arousal/Alertness: Awake/alert Behavior During Therapy: WFL for tasks assessed/performed Overall Cognitive Status: Impaired/Different from baseline Area of Impairment: Attention;Following commands               Rancho Levels of Cognitive Functioning [place]igos Scales of Cognitive Functioning: Localized response   Current Attention Level: Focused   Following Commands: Follows one step commands inconsistently;Follows one step commands with increased time       General Comments: patient is alert, able to follow simple commands with multimodal cues and increased time. pt marching with bil LE in sitting on command, opening mouth, sticking out tongue and squeezing right hand      Exercises      General Comments General comments (skin integrity, edema, etc.): pt on room air during EOB and mobility while maintaining SpO2 above 96%      Pertinent  Vitals/Pain Pain Assessment: No/denies pain    Home Living                      Prior Function            PT Goals (current goals can now be found in the care plan section) Progress towards PT goals: Progressing toward goals    Frequency    Min 2X/week      PT Plan Current plan remains appropriate    Co-evaluation              AM-PAC PT "6 Clicks" Mobility   Outcome Measure  Help needed turning from your back to your side while in a flat bed without using bedrails?: Total Help needed moving from lying on your back to sitting on the side of a flat bed without using bedrails?:  Total Help needed moving to and from a bed to a chair (including a wheelchair)?: Total Help needed standing up from a chair using your arms (e.g., wheelchair or bedside chair)?: Total Help needed to walk in hospital room?: Total Help needed climbing 3-5 steps with a railing? : Total 6 Click Score: 6    End of Session Equipment Utilized During Treatment: Oxygen Activity Tolerance: Patient tolerated treatment well Patient left: in bed;with call bell/phone within reach;with bed alarm set Nurse Communication: Mobility status;Need for lift equipment PT Visit Diagnosis: Other abnormalities of gait and mobility (R26.89);Muscle weakness (generalized) (M62.81)     Time: 0938-1829 PT Time Calculation (min) (ACUTE ONLY): 33 min  Charges:  $Therapeutic Activity: 23-37 mins                     Jeraline Marcinek P, PT Acute Rehabilitation Services Pager: [phone] Office: [phone]    Rayah Fines B Estes Lehner 12/11/2019, 9:46 AM

## 2019-12-11 NOTE — Progress Notes (Signed)
NAMEEmiliano Orozco, MRN:  [id number], DOB:  10/23/1965, LOS: 40 ADMISSION DATE:  11/01/2019, CONSULTATION DATE:  9/29 REFERRING MD:  EDP, CHIEF COMPLAINT:  Cardiac arrest   Brief History   54 y/o male with HIV found down outside a convenience store on 9/27, received out of hospital CPR.  After ICU admission has had acute encephalopathy, concern for anoxic brain injury.  Received a tracheostomy 10/13.    Past Medical History  ETOH Abuse HIV - dx 1995  Significant Hospital Events   9/29 Admit post PEA arrest.  UDS positive for THC, benzo's.  ETOH 177 10/05 EEG ongoing. Versed restarted overnight due to agitation. Nicardipine increased.  10/06 Developed tachypnea with WUA, no follow commands off sedation  10/07 Vomiting, TF held / restarted with recurrent vomiting 10/16 tracheostomy collar for 9 hours 10/18->10/19 off vent all night  10/20> TC Consults:  Neurology  Procedures:  ETT 9/29 >>10/13 Tracheostomy>>10/3  Significant Diagnostic Tests:  CT head 9/29 >  No evidence of acute intracranial abnormality. Moderate generalized cerebral atrophy, advanced for age. Chronic medially displaced fracture deformity of the right lamina papyracea. Mild ethmoid and maxillary sinus mucosal thickening.  CT cervical spine 9/29 > No evidence of acute fracture to the cervical spine. Partially imaged airspace disease within the imaged lung apices (extensive on the left). Clinical correlation is recommended. 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  EEG 9/29 > Patient was noted to have frequent episodes of axial jerking with eye opening. Concomitant EEG showed generalized polyspikes consistent with myoclonic seizures. EEG also showed continuous generalized background suppression. EEG was not reactive to tactile stimulation. Hyperventilation and photic stimulation were not performed  ECHO 9/30 > No evidence of wall  motion abnormality  MRI 10/3 > Mild DWI hyperintensity involving the caudate and possibly putamen bilaterally. Additionally, possible subtle FLAIR hyperintensity involving the cerebellum, cortex diffusely, and bilateral basal ganglia. Although not diagnostic, these findings could be seen with early hypoxic/ischemic brain injury in this patient status post PEA arrest.   EEG 10/08> severe diffuse encephalopathy likely related to anoxic/hypoxic brain injury  Testicular US 10/10> showed large left hydrocele and left inguinal hernia. No evidence of testicular mass.  MRI brain 10/11: evolving hypoxic, anoxic brain injury compared to previous MRI   CT abdomen pelvis 10/12: Right inguinal hernia and partial small bowel obstruction, large left inguinal hernia  Abdominal x-ray: Small bowel obstruction  Micro Data:  BCx2 9/29 >> negative  Urine 9/29 >> UA few bacteria, WBC, RBC, CaOxalate, and Mucus Covid, Flu A/B 9/29 >> negative Tracheal aspirate 10/5 >> normal flora   Antimicrobials:  Vancomycin 9/29 >> 9/30 Cefepime 9/29 >> 9/30 Cefepime 10/5 >> 10/11  Interim history/subjective:  Tolerating 5L via TC.  Objective   Blood pressure 119/71, pulse 98, temperature 97.6 F (36.4 C), resp. rate 14, height 5\' 6"  (1.676 m), weight 72.6 kg, SpO2 97 %.    FiO2 (%):  [21 %] 21 %   Intake/Output Summary (Last 24 hours) at 12/11/2019 1449 Last data filed at 12/11/2019 1221 Gross per 24 hour  Intake --  Output 775 ml  Net -775 ml   Filed Weights   12/09/19 0500 12/10/19 0415 12/11/19 0358  Weight: 73.3 kg 75.4 kg 72.6 kg   Physical Exam: General: Chronically ill-appearing man, moving randomly in the bed HENT: Oropharynx clear, pupils equal Neck: Trach in good position, clean and dry, trach collar has been pushed  aside Respiratory: Clear bilaterally, comfortable respiratory pattern Cardiovascular: Regular, no murmur Extremities: No edema Neuro: Eyes open, roving eye movements, turns  head to voice, may intermittently follow some commands according to RN Skin: No rash  Resolved Hospital Problem list   Partial small bowel obstruction > resolved  Assessment & Plan:  54 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 S/p trach due to failure to wean from ventilator  Trach in place for airway protection Anoxic brain injury secondary to cardiac arrest c/b myoclonic seizures Continue Shiley #6 cuffless.  Consider downsize #4.  Depending on his degree of secretions, difficulty clearing secretions we could consider capping trials going forward Humidified oxygen as ordered Scopolamine patch as ordered Routine trach care Appreciate PMV assistance and input We will continue to follow weekly     Critical care time: n/a minutes    Levy Pupa, MD, PhD 12/11/2019, 2:49 PM [facility] [phone] or if no answer [phone]

## 2019-12-11 NOTE — Plan of Care (Signed)
  Problem: Clinical Measurements: Goal: Respiratory complications will improve Outcome: Progressing   Problem: Clinical Measurements: Goal: Respiratory complications will improve Outcome: Progressing   Problem: Clinical Measurements: Goal: Cardiovascular complication will be avoided Outcome: Progressing   Problem: Nutrition: Goal: Adequate nutrition will be maintained Outcome: Progressing   Problem: Elimination: Goal: Will not experience complications related to bowel motility Outcome: Progressing   Problem: Elimination: Goal: Will not experience complications related to urinary retention Outcome: Progressing

## 2019-12-12 LAB — GLUCOSE, CAPILLARY
Glucose-Capillary: 103 mg/dL — ABNORMAL HIGH (ref 70–99)
Glucose-Capillary: 110 mg/dL — ABNORMAL HIGH (ref 70–99)
Glucose-Capillary: 117 mg/dL — ABNORMAL HIGH (ref 70–99)
Glucose-Capillary: 153 mg/dL — ABNORMAL HIGH (ref 70–99)
Glucose-Capillary: 97 mg/dL (ref 70–99)
Glucose-Capillary: 97 mg/dL (ref 70–99)

## 2019-12-12 MED ORDER — BICTEGRAVIR-EMTRICITAB-TENOFOV 50-200-25 MG PO TABS
1.0000 | ORAL_TABLET | Freq: Every day | ORAL | Status: DC
  Administered 2019-12-13 – 2019-12-18 (×6): 1 via ORAL
  Filled 2019-12-12 (×7): qty 1

## 2019-12-12 NOTE — Progress Notes (Addendum)
[facility] PROGRESS NOTE  Ryan Orozco NTI:[id number] DOB: 1965-04-22 DOA: 11/01/2019 PCP: Default, Provider, MD  Status: Remains inpatient appropriate because:Altered mental status, Unsafe d/c plan and Inpatient level of care appropriate due to severity of illness. Patient newly diagnosed with anoxic brain injury. Still has excessive tracheal secretions requiring frequent suctioning- has just started PMV training.  Dispo: The patient is from: Home              Anticipated d/c is to: SNF              Anticipated d/c date is: > 3 days              Patient currently is not medically stable to d/c. Patient currently requiring frequent suctioning therefore at this time is not a candidate for discharge. Have also just begun PMV training. Medicaid application in process. Patient will also need to have disability paperwork completed since do not expect full recovery   Code Status: Full Family Communication: No family at bedside DVT prophylaxis: Subcutaneous heparin Vaccination status: Does not appear to have received Covid vaccine but will need to confirm with family  Foley catheter: No  HPI: 54 year old male with HIV, chronic alcohol abuse who was presented to the emergency department after being found unresponsive outside a convinient store.  EMS found him pulseless in PEA, unknown downtime, successfully resuscitated and brought to the emergency department.  UDS positive for THC/benzodiazepines.  CT head unremarkable.  Failed extubation trials due to severe anoxic brain injury and had tracheostomy placed.  PEG placed on 10/15.  Now the plan is for placement in a skilled nursing facility/LTAC.  Prolonged hospital course.  Difficult placement. TOC following  Subjective: Awakens from sleep.  Smiled briefly.  Having difficulty following commands with upper extremities due to ataxic movements.  Briefly followed simple commands with lower extremities-went back to sleep  Objective: Vitals:    12/12/19 0902 12/12/19 1100  BP: 115/88   Pulse: 95 94  Resp: 14 18  Temp: 97.8 F (36.6 C)   SpO2: 99%     Intake/Output Summary (Last 24 hours) at 12/12/2019 1156 Last data filed at 12/11/2019 1821 Gross per 24 hour  Intake --  Output 675 ml  Net -675 ml   Filed Weights   12/10/19 0415 12/11/19 0358 12/12/19 0451  Weight: 75.4 kg 72.6 kg 72.3 kg    Exam: Constitutional: NAD, calm, appears to be comfortable sleepy Eyes: PERRL, lids and conjunctivae normal, appears to have mild bilateral disconjugate gaze when eyes are open Respiratory: Coarse to auscultation bilaterally, #6 cuffless trach in place with thick creamy yellow secretions noted, trach collar in place 21% FiO2 Cardiovascular: Regular rate and rhythm, no murmurs / rubs / gallops. No extremity edema. 2+ pedal pulses. No carotid bruits.  Abdomen: no tenderness with palpation, PEG tube with tube feeding infusing, bowel sounds positive.  Musculoskeletal: no clubbing / cyanosis. No joint deformity upper and lower extremities. no contractures no extremity spasticity detected does appear to have upper extremity ataxia with attempted purposeful movement. Decreased muscle tone both upper and lower extremities  Skin: Several decubiti as described below Neurologic: CN 2-12 grossly intact appears to have mild bilateral disconjugate gaze. Sensation appears to be intact both upper and lower extremities, difficult to test strength in upper extremities but appears to be 2/5 with lower extremities 2-3/5 Psychiatric: Awake and, appears to be oriented to name.  Smiled appropriately.   Assessment/Plan: Anoxic brain injury 2/2 PEA arrest:  -Presumed 2/2 alcohol/benzodiazepines  overdose.  UDS positive for THC/benzodiazepines.  Elevated ethanol level on presentation. -Suspect lifelong sequelae from this event and do not expect patient to recover to baseline mentation; long-term placement in SNF expected -Continue OT/PT/SLP-11/8 patient more  alert and able to participate with therapies documented improvement in cognition and mobility  Acute hypoxemic respiratory failure/new tracheostomy tube requirement -Continues with thick pale yellow secretions suctioning not as frequent; no fevers or other signs of active pneumonia although could have chronic tracheobronchitis-need to consider obtaining sputum culture; tracheal aspirate 10/5 unremarkable -He has #6 cuffless trach and SLP assisting with PMV training-PCCM suggesting if secretions remain decreased consider transitioning to #4 trach -Trach team following peripherally  Dysphagia 2/2 anoxic brain injury -Once tolerating PMV can assess swallowing more thoroughly -Continue tube feeding per PEG  Hypertension/grade 1 diastolic dysfunction:  -Blood pressure stable on amlodipine  -Echocardiogram this admission with preserved LVEF with evidence of grade 1 diastolic dysfunction. No physiology currently consistent with heart failure  HIV:  -CD4 count of 124.  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   -Continue Keppra -Continue seizure precautions  Inguinal hernias -Evaluated by general surgery this admission. Documented as chronically incarcerated. Patient has been clinically stable and tolerating tube feedings and being bowel movements so unless patient obstructed would not be considered a candidate for repair. Even if obstructed consulting surgeon was not sure he would be a candidate for hernia repair regardless  Goals of care:  -Palliative care was involved during this hospitalization.   -Goals of care were discussed. remains full code.  - Palliative care recommended outpatient follow-up with palliative providers.  Protein calorie malnutrition: Nutrition Status: Nutrition Problem: Increased nutrient needs Etiology: acute illness Signs/Symptoms:  estimated needs Interventions: Tube feeding Estimated body mass index is 25.73 kg/m as calculated from the following:   Height as of this encounter: 5' 6"  (1.676 m).   Weight as of this encounter: 72.3 kg.  Multiple decubitus ulcers not POA Pressure Injury 11/13/19 Anus Stage 3 -  Full thickness tissue loss. Subcutaneous fat may be visible but bone, tendon or muscle are NOT exposed. (Active)  Date First Assessed/Time First Assessed: 11/13/19 1000   Location: Anus  Staging: Stage 3 -  Full thickness tissue loss. Subcutaneous fat may be visible but bone, tendon or muscle are NOT exposed.  Present on Admission: No    Assessments 11/17/2019  8:00 AM 12/06/2019 10:00 AM  Dressing Type None --  Site / Wound Assessment Pink --  Peri-wound Assessment Intact --  Wound Length (cm) -- 1 cm  Wound Width (cm) -- 0.5 cm  Wound Depth (cm) -- 0 cm  Wound Surface Area (cm^2) -- 0.5 cm^2  Wound Volume (cm^3) -- 0 cm^3  Drainage Amount None --     No Linked orders to display     Wound / Incision (Open or Dehisced) 11/17/19 Puncture Abdomen Left;Anterior;Upper G-tube insertion site  (Active)  Date First Assessed/Time First Assessed: 11/17/19 1646   Wound Type: Puncture  Location: Abdomen  Location Orientation: Left;Anterior;Upper  Wound Description (Comments): G-tube insertion site   Present on Admission: No    Assessments 11/17/2019  4:47 PM 12/06/2019 10:00 AM  Dressing Type Gauze (Comment);Tape dressing --  Dressing Changed New --  Dressing Status Clean;Dry;Intact --  Dressing Change Frequency PRN --  Site / Wound Assessment Clean;Dry;Pink --  Peri-wound Assessment Intact --  Wound Length (cm) --  0 cm  Wound Width (cm) -- 0 cm  Wound Depth (cm) -- 0 cm  Wound Volume (cm^3) -- 0 cm^3  Wound Surface Area (cm^2) -- 0 cm^2  Closure None --  Drainage Amount None --  Treatment Cleansed --     No Linked orders to display     Pressure Injury 12/01/19 Ankle Left;Lateral Stage 2 -  Partial thickness  loss of dermis presenting as a shallow open injury with a red, pink wound bed without slough. (Active)  Date First Assessed/Time First Assessed: 12/01/19 2002   Location: Ankle  Location Orientation: Left;Lateral  Staging: Stage 2 -  Partial thickness loss of dermis presenting as a shallow open injury with a red, pink wound bed without slough.    Assessments 12/01/2019  7:52 PM 12/06/2019 10:00 AM  Dressing Type Foam - Lift dressing to assess site every shift --  Dressing Clean;Dry;Intact --  Site / Wound Assessment Pink --  Peri-wound Assessment Intact --  Wound Length (cm) -- 2.5 cm  Wound Width (cm) -- 0.5 cm  Wound Depth (cm) -- 0 cm  Wound Surface Area (cm^2) -- 1.25 cm^2  Wound Volume (cm^3) -- 0 cm^3  Margins Unattached edges (unapproximated) --  Drainage Amount Scant --  Drainage Description Serosanguineous --  Treatment Cleansed --     No Linked orders to display     Data Reviewed: Basic Metabolic Panel: No results for input(s): NA, K, CL, CO2, GLUCOSE, BUN, CREATININE, CALCIUM, MG, PHOS in the last 168 hours. Liver Function Tests: No results for input(s): AST, ALT, ALKPHOS, BILITOT, PROT, ALBUMIN in the last 168 hours. No results for input(s): LIPASE, AMYLASE in the last 168 hours. No results for input(s): AMMONIA in the last 168 hours. CBC: No results for input(s): WBC, NEUTROABS, HGB, HCT, MCV, PLT in the last 168 hours. Cardiac Enzymes: No results for input(s): CKTOTAL, CKMB, CKMBINDEX, TROPONINI in the last 168 hours. BNP (last 3 results) No results for input(s): BNP in the last 8760 hours.  ProBNP (last 3 results) No results for input(s): PROBNP in the last 8760 hours.  CBG: Recent Labs  Lab 12/11/19 1635 12/11/19 1904 12/11/19 2315 12/12/19 0411 12/12/19 0900  GLUCAP 104* 113* 94 117* 153*    No results found for this or any previous visit (from the past 240 hour(s)).   Studies: No results found.  Scheduled Meds: . amLODipine  10 mg Per Tube  Daily  . chlorhexidine gluconate (MEDLINE KIT)  15 mL Mouth Rinse BID  . Chlorhexidine Gluconate Cloth  6 each Topical Q0600  . dolutegravir  50 mg Oral Daily  . emtricitabine-tenofovir AF  1 tablet Per Tube Daily  . feeding supplement (PROSource TF)  45 mL Per Tube TID  . folic acid  1 mg Per Tube Daily  . free water  200 mL Per Tube Q4H  . heparin injection (subcutaneous)  5,000 Units Subcutaneous Q8H  . levETIRAcetam  1,500 mg Per Tube BID  . mouth rinse  15 mL Mouth Rinse 10 times per day  . nutrition supplement (JUVEN)  1 packet Oral BID BM  . scopolamine  1 patch Transdermal Q72H  . sulfamethoxazole-trimethoprim  1 tablet Per Tube Once per day on Mon Wed Fri  . thiamine  100 mg Per Tube Daily   Continuous Infusions: . feeding supplement (OSMOLITE 1.5 CAL) 1,000 mL (12/11/19 1700)    Active Problems:   Cardiac arrest [facility])   Community acquired pneumonia of left upper lobe of lung  Anoxic brain injury ([place])   Alcohol abuse   Acute respiratory failure with hypoxia (HCC)   Vomiting   Pressure injury of skin   Small bowel obstruction ([place])   Palliative care encounter   Bowel obstruction (HCC)   Chronic respiratory failure (HCC)   Diastolic dysfunction   Tracheostomy dependent ([place])   Dysphagia   Protein-calorie malnutrition ([place])   Seizures ([place])   Bilateral recurrent inguinal hernia   Consultants:  PCCM  Palliative medicine  General surgery  Neurology  Interventional radiology  Procedures:  EEG  Echocardiogram  Antibiotics: Anti-infectives (From admission, onward)   Start     Dose/Rate Route Frequency Ordered Stop   11/17/19 1548  ceFAZolin (ANCEF) 2-4 GM/100ML-% IVPB       Note to Pharmacy: Domenick Bookbinder   : cabinet override      11/17/19 1548 11/18/19 0359   11/16/19 1515  ceFAZolin (ANCEF) IVPB 2g/100 mL premix        2 g 200 mL/hr over 30 Minutes Intravenous To Radiology 11/16/19 1511 11/17/19 1625   11/15/19 0900   sulfamethoxazole-trimethoprim (BACTRIM DS) 800-160 MG per tablet 1 tablet        1 tablet Per Tube Once per day on Mon Wed Fri 11/13/19 1906     11/13/19 1615  dolutegravir (TIVICAY) tablet 50 mg        50 mg Oral Daily 11/13/19 1521     11/13/19 1615  emtricitabine-tenofovir AF (DESCOVY) 200-25 MG per tablet 1 tablet        1 tablet Per Tube Daily 11/13/19 1521     11/13/19 1530  sulfamethoxazole-trimethoprim (BACTRIM DS) 800-160 MG per tablet 1 tablet  Status:  Discontinued        1 tablet Oral Once per day on Mon Wed Fri 11/13/19 1441 11/13/19 1906   11/13/19 1500  bictegravir-emtricitabine-tenofovir AF (BIKTARVY) 50-200-25 MG per tablet 1 tablet  Status:  Discontinued        1 tablet Oral Daily 11/13/19 1403 11/13/19 1521   11/10/19 1000  erythromycin 250 mg in sodium chloride 0.9 % 100 mL IVPB        250 mg 100 mL/hr over 60 Minutes Intravenous Every 8 hours 11/10/19 0907 11/11/19 2000   11/07/19 1200  ceFEPIme (MAXIPIME) 2 g in sodium chloride 0.9 % 100 mL IVPB        2 g 200 mL/hr over 30 Minutes Intravenous Every 8 hours 11/07/19 1013 11/13/19 2127   11/03/19 1200  cefTRIAXone (ROCEPHIN) 2 g in sodium chloride 0.9 % 100 mL IVPB        2 g 200 mL/hr over 30 Minutes Intravenous Every 24 hours 11/03/19 0922 11/05/19 1427   11/02/19 0800  vancomycin (VANCOREADY) IVPB 750 mg/150 mL  Status:  Discontinued        750 mg 150 mL/hr over 60 Minutes Intravenous Every 12 hours 11/01/19 1935 11/02/19 1105   11/02/19 0600  piperacillin-tazobactam (ZOSYN) IVPB 3.375 g  Status:  Discontinued        3.375 g 12.5 mL/hr over 240 Minutes Intravenous Every 8 hours 11/02/19 0311 11/03/19 0920   11/01/19 2200  ceFEPIme (MAXIPIME) 2 g in sodium chloride 0.9 % 100 mL IVPB  Status:  Discontinued        2 g 200 mL/hr over 30 Minutes Intravenous Every 12 hours 11/01/19 2143 11/02/19 0311   11/01/19 1915  vancomycin (VANCOREADY) IVPB 1500 mg/300 mL        1,500 mg 150 mL/hr over  120 Minutes Intravenous   Once 11/01/19 1909 11/01/19 2147   11/01/19 1845  cefTRIAXone (ROCEPHIN) 1 g in sodium chloride 0.9 % 100 mL IVPB        1 g 200 mL/hr over 30 Minutes Intravenous  Once 11/01/19 1842 11/01/19 2051   11/01/19 1845  azithromycin (ZITHROMAX) 500 mg in sodium chloride 0.9 % 250 mL IVPB        500 mg 250 mL/hr over 60 Minutes Intravenous  Once 11/01/19 1842 11/01/19 2051       Time spent: 15 minutes    Erin Hearing ANP  [facility] Pager [phone]. If 7PM-7AM, please contact night-coverage at [email or web address] 12/12/2019, 11:56 AM  LOS: 41 days

## 2019-12-12 NOTE — Progress Notes (Signed)
Ok to optimize his antitretrovirals (Tivicay + Descovy) to [place] for discharge to SNF.   Ulyses Southward, PharmD, BCIDP, AAHIVP, CPP Infectious Disease Pharmacist 12/12/2019 2:15 PM

## 2019-12-13 LAB — GLUCOSE, CAPILLARY
Glucose-Capillary: 104 mg/dL — ABNORMAL HIGH (ref 70–99)
Glucose-Capillary: 128 mg/dL — ABNORMAL HIGH (ref 70–99)
Glucose-Capillary: 110 mg/dL — ABNORMAL HIGH (ref 70–99)
Glucose-Capillary: 101 mg/dL — ABNORMAL HIGH (ref 70–99)
Glucose-Capillary: 108 mg/dL — ABNORMAL HIGH (ref 70–99)
Glucose-Capillary: 125 mg/dL — ABNORMAL HIGH (ref 70–99)

## 2019-12-13 NOTE — NC FL2 (Addendum)
[place] LEVEL OF CARE SCREENING TOOL     IDENTIFICATION  Patient Name: Ryan Orozco Birthdate: July 08, 1965 Sex: male Admission Date (Current Location): 11/01/2019  [facility] and [place] Number:  Herbalist and Address:  [facility] [place], [place] [address] [place], [place] [address]      Provider Number: [id number]  Attending Physician Name and Address:  Darliss Cheney, MD  Relative Name and Phone Number:  Cheyenne Adas [phone]    Current Level of Care: Hospital Recommended Level of Care: [place] Prior Approval Number:    Date Approved/Denied:   PASRR Number:  [id number]  Discharge Plan: SNF    Current Diagnoses: Patient Active Problem List   Diagnosis Date Noted  . Diastolic dysfunction 36/64/4034  . Status post tracheostomy ([place]) 12/08/2019  . Dysphagia 12/08/2019  . Protein-calorie malnutrition ([place]) 12/08/2019  . Seizures ([place]) 12/08/2019  . Bilateral recurrent inguinal hernia 12/08/2019  . Chronic respiratory failure ([place])   . Small bowel obstruction ([place])   . Palliative care encounter   . Bowel obstruction ([place])   . Pressure injury of skin 11/14/2019  . Vomiting   . Acute respiratory failure with hypoxia ([place])   . Community acquired pneumonia of left upper lobe of lung   . Anoxic brain injury ([place])   . Alcohol abuse   . Cardiac arrest ([place]) 11/01/2019  . HIV (human immunodeficiency virus infection) ([place]) 02/18/2018  . Healthcare maintenance 02/18/2018  . Hypertension 02/18/2018    Orientation RESPIRATION BLADDER Height & Weight      (unable to assess trach patient)  Tracheostomy, O2 Incontinent, External catheter Weight: 72.3 kg Height:  5' 6"  (167.6 cm)  BEHAVIORAL SYMPTOMS/MOOD NEUROLOGICAL BOWEL NUTRITION STATUS  Other (Comment) (Agitation/ restless/ cooperative) Convulsions/Seizures (Hx of seizures on Keppra) Incontinent Feeding tube (Osmolite 1.5 @ 53m/hr)  AMBULATORY STATUS  COMMUNICATION OF NEEDS Skin     Non-Verbally (Trach patient) Normal, Other (Comment) (some cracking noted to feet barrier cream applied)                       Personal Care Assistance Level of Assistance  Bathing, Feeding, Dressing, Total care Bathing Assistance: Maximum assistance Feeding assistance:  (NPO)   Total Care Assistance: Maximum assistance   Functional Limitations Info  Sight, Speech, Hearing Sight Info: Adequate Hearing Info: Adequate (does respond when spoken to) Speech Info: Impaired (due to trach)    [place] (By licensed PT), OT (By licensed OT)     PT Frequency: 5X OT Frequency: 5X            Contractures Contractures Info: Not present    Additional Factors Info  Code Status, Allergies, Psychotropic, Insulin Sliding Scale, Isolation Precautions, Suctioning Needs Code Status Info: Full Allergies Info: Tomatoes Psychotropic Info: none Insulin Sliding Scale Info: See d/c summary for slidiing scale info Isolation Precautions Info: None     Current Medications (12/13/2019):  This is the current hospital active medication list Current Facility-Administered Medications  Medication Dose Route Frequency Provider Last Rate Last Admin  . acetaminophen (TYLENOL) 160 MG/5ML solution 650 mg  650 mg Per Tube Q6H PRN BErick Colace NP   650 mg at 11/28/19 1800  . amLODipine (NORVASC) tablet 10 mg  10 mg Per Tube Daily BErick Colace NP   10 mg at 12/13/19 1019  . bictegravir-emtricitabine-tenofovir AF (BIKTARVY) 50-200-25 MG per tablet 1 tablet  1 tablet Oral Daily Pham,  Minh Q, RPH-CPP   1 tablet at 12/13/19 1019  . chlorhexidine gluconate (MEDLINE KIT) (PERIDEX) 0.12 % solution 15 mL  15 mL Mouth Rinse BID Erick Colace, NP   15 mL at 12/13/19 1019  . Chlorhexidine Gluconate Cloth 2 % PADS 6 each  6 each Topical Q0600 Erick Colace, NP   6 each at 12/13/19 0600  . docusate (COLACE) 50 MG/5ML liquid 100 mg  100 mg Per Tube  BID PRN Erick Colace, NP   100 mg at 11/14/19 0956  . feeding supplement (OSMOLITE 1.5 CAL) liquid 1,000 mL  1,000 mL Per Tube Continuous Erick Colace, NP 60 mL/hr at 12/12/19 1200 1,000 mL at 12/12/19 1200  . feeding supplement (PROSource TF) liquid 45 mL  45 mL Per Tube TID Erick Colace, NP   45 mL at 12/13/19 1018  . folic acid (FOLVITE) tablet 1 mg  1 mg Per Tube Daily Erick Colace, NP   1 mg at 12/13/19 1019  . free water 200 mL  200 mL Per Tube Q4H Adhikari, Amrit, MD   200 mL at 12/13/19 1020  . heparin injection 5,000 Units  5,000 Units Subcutaneous Q8H Erick Colace, NP   5,000 Units at 12/13/19 0600  . influenza vac split quadrivalent PF (FLUARIX) injection 0.5 mL  0.5 mL Intramuscular Prior to discharge Freddi Starr, MD      . levETIRAcetam (KEPPRA) 100 MG/ML solution 1,500 mg  1,500 mg Per Tube BID Erick Colace, NP   1,500 mg at 12/13/19 1018  . MEDLINE mouth rinse  15 mL Mouth Rinse 10 times per day Erick Colace, NP   15 mL at 12/13/19 1020  . nutrition supplement (JUVEN) (JUVEN) powder packet 1 packet  1 packet Oral BID BM Danford, Suann Larry, MD   1 packet at 12/13/19 1019  . polyethylene glycol (MIRALAX / GLYCOLAX) packet 17 g  17 g Per Tube Daily PRN Danford, Suann Larry, MD      . scopolamine (TRANSDERM-SCOP) 1 MG/3DAYS 1.5 mg  1 patch Transdermal Q72H Erick Colace, NP   1.5 mg at 12/12/19 0921  . sulfamethoxazole-trimethoprim (BACTRIM DS) 800-160 MG per tablet 1 tablet  1 tablet Per Tube Once per day on Mon Wed Fri Erick Colace, NP   1 tablet at 12/13/19 1030  . thiamine tablet 100 mg  100 mg Per Tube Daily Erick Colace, NP   100 mg at 12/13/19 1019     Discharge Medications: Please see discharge summary for a list of discharge medications.  Relevant Imaging Results:  Relevant Lab Results:   Additional Information SS# [id number] this is a letter of guarantee patient with a trach  Angelita Ingles, RN

## 2019-12-13 NOTE — Progress Notes (Signed)
Nutrition Follow-up  RD working remotely.  DOCUMENTATION CODES:   Not applicable  INTERVENTION:   Continue tube feeds via PEG: - Continue Osmolite 1.5 @ 60 ml/hr (1440 ml/day) - ProSource TF 45 ml TID - Juven BID - Free water flushes of 200 ml q 4 hours   Tube feeding regimen provides 2470 kcals, 128 grams protein, 1097 ml free water (2297 ml with flushes).  NUTRITION DIAGNOSIS:   Increased nutrient needs related to acute illness as evidenced by estimated needs.  Ongoing  GOAL:   Patient will meet greater than or equal to 90% of their needs  Met via TF  MONITOR:   Labs, Weight trends, TF tolerance, Skin, I & O's  REASON FOR ASSESSMENT:   Ventilator, Consult Enteral/tube feeding initiation and management  ASSESSMENT:   Patient with PMH significant for HIV and ETOH use. Presents this admission with cardiac arrest and suspected aspiration in setting of heavy ETOH use.   10/07 - vomiting, TF held, later restarted with recurrent vomiting 10/12 - R/L inguinal hernia, partial SBO  10/13 - trach 10/15 - PEG 10/18 - ATC  Notes reviewed. Pt continues to have thick pale yellow secretions from trach but suctioning not as frequent. Plan is for pt to d/c to SNF.  Will continue to monitor weights and adjust TF regimen as appropriate.  Current TF: Osmolite 1.5 @ 60 ml/hr, ProSource TF 45 ml TID, free water flushes of 200 ml q 4 hours  Admission weight: 78.9 kg Current weight: 72.3 kg  Medications reviewed and include: folic acid, Juven, thiamine  Labs reviewed. CBGs: 97-128 x 24 hours  UOP: 1000 ml x 24 hours  Diet Order:   Diet Order            Diet NPO time specified  Diet effective midnight                 EDUCATION NEEDS:   Not appropriate for education at this time  Skin:  Skin Assessment: Skin Integrity Issues: Stage II: L ankle Stage III: anus Other: puncture abdomen (PEG insertion site)  Last BM:  12/13/19  Height:   Ht Readings from  Last 1 Encounters:  11/01/19 5' 6"  (1.676 m)    Weight:   Wt Readings from Last 1 Encounters:  12/13/19 72.3 kg    BMI:  Body mass index is 25.73 kg/m.  Estimated Nutritional Needs:   Kcal:  2300-2500  Protein:  115-130 grams  Fluid:  >/= 2 L/day    Gaynell Face, MS, RD, LDN Inpatient Clinical Dietitian Please see AMiON for contact information.

## 2019-12-13 NOTE — Plan of Care (Signed)

## 2019-12-13 NOTE — TOC Progression Note (Signed)
Transition of Care [facility]) - Progression Note    Patient Details  Name: Almer Bushey MRN: [id number] Date of Birth: 15-Sep-1965  [facility] CM/SW Contact  Beckie Busing, RN Phone Number: cellulitis  12/13/2019, 11:06 AM  Clinical Narrative:    TOC continues to follow for SNF placement. Per [name] NP patient is not clinically ready for discharge. FL2 initiated and pended in notes, PASRR complete # [id number]. CM will complete FL2 and fax out to facilities when patient is medically ready. TOC will continue to follow.  Expected Discharge Plan: Long Term Acute Care (LTAC) Barriers to Discharge: Continued Medical Work up  Expected Discharge Plan and Services Expected Discharge Plan: Long Term Acute Care (LTAC)   Discharge Planning Services: CM Consult   Living arrangements for the past 2 months: Single Family Home                                       Social Determinants of Health (SDOH) Interventions    Readmission Risk Interventions No flowsheet data found.

## 2019-12-13 NOTE — Progress Notes (Addendum)
[facility] PROGRESS NOTE  Ryan Orozco YYT:[id number] DOB: [birth year] DOA: 11/01/2019 PCP: Default, Provider, MD  Status: Remains inpatient appropriate because:Altered mental status, Unsafe d/c plan and Inpatient level of care appropriate due to severity of illness. Patient newly diagnosed with anoxic brain injury. Still has excessive tracheal secretions requiring frequent suctioning- has just started PMV training.  Dispo: The patient is from: Home              Anticipated d/c is to: SNF              Anticipated d/c date is: > 3 days              Patient currently is not medically stable to d/c. Patient currently requiring frequent suctioning therefore at this time is not a candidate for discharge. PMV training in process. Medicaid application in process. Patient will also need to have disability paperwork completed since do not expect full recovery.  Tube has been in place greater than 30 days.   Code Status: Full Family Communication: No family at bedside-earlier in week spoke with patient's Sister [name] at bedside DVT prophylaxis: Subcutaneous heparin Vaccination status: Does not appear to have received Covid vaccine but will need to confirm with family  Foley catheter: No  HPI: 54 year old male with HIV, chronic alcohol abuse who was presented to the emergency department after being found unresponsive outside a convinient store.  EMS found him pulseless in PEA, unknown downtime, successfully resuscitated and brought to the emergency department.  UDS positive for THC/benzodiazepines.  CT head unremarkable.  Failed extubation trials due to severe anoxic brain injury and had tracheostomy placed.  PEG placed on 10/15.  Now the plan is for placement in a skilled nursing facility/LTAC.  Prolonged hospital course.  Difficult placement. TOC following  Significant Hospital Events   9/29 Admit post PEA arrest. UDS positive for THC, benzo's. ETOH 177 10/05 EEG ongoing. Versed  restarted overnight due to agitation. Nicardipine increased.  10/06 Developed tachypnea with WUA, no follow commands off sedation  10/07 Vomiting, TF held / restarted with recurrent vomiting 10/16 tracheostomy collar for 9 hours 10/18->10/19 off vent all night  10/20> TC  Subjective: Very awake and moving all extremities x4.  Making appropriate eye contact.  Very weak attempts at following simple commands but is inconsistent.  Although does not have distinct ataxic movements of upper extremities asked to lift arms in place in front of him primary motor activity originated from shoulder girdle bilaterally with patient lifting arms in front of him briefly with both arms flexed awkwardly-spasticity detected.  Attempted to phonate but words incomprehensible.  Objective: Vitals:   12/13/19 0755 12/13/19 1125  BP:    Pulse: 97 92  Resp: 18 18  Temp:    SpO2: 95% 95%    Intake/Output Summary (Last 24 hours) at 12/13/2019 1237 Last data filed at 12/13/2019 1010 Gross per 24 hour  Intake --  Output 1600 ml  Net -1600 ml   Filed Weights   12/11/19 0358 12/12/19 0451 12/13/19 0528  Weight: 72.6 kg 72.3 kg 72.3 kg    Exam: Constitutional: NAD, calm, appears to be comfortable sleepy Eyes: PERRL, lids and conjunctivae normal, previous disconjugate gaze currently not seen Respiratory: Clear to auscultation bilaterally, #6 cuffless trach in place, trach collar in place 21% FiO2 Cardiovascular: Regular rate and rhythm, no murmurs / rubs / gallops. No extremity edema. 2+ pedal pulses.  Abdomen: no tenderness with palpation, PEG tube with tube feeding infusing, bowel sounds  positive.  Musculoskeletal: no clubbing / cyanosis. No joint deformity upper and lower extremities. no contractures; moves upper extremities spontaneously and on command but activity appears to be originating from the shoulder girth with mild ataxia detected,decreased muscle tone both upper and lower extremities  Skin: Several  decubiti as described below Neurologic: CN 2-12 grossly intact definitive bilateral disconjugate gaze today,. Sensation appears to be intact both upper and lower extremities, difficult to test strength in upper extremities but appears to be 2/5 with lower extremities 2-3/5 Psychiatric: Awake and, appears to be oriented to name.  Smiled appropriately.   Assessment/Plan: Anoxic brain injury 2/2 PEA arrest:  -Presumed 2/2 alcohol/benzodiazepines overdose.  UDS positive for THC/benzodiazepines.  Elevated ethanol level on presentation. -Suspect lifelong sequelae from this event and do not expect patient to recover to baseline mentation; long-term placement in SNF expected -Continue OT/PT/SLP-11/8 patient more alert and able to participate with therapies documented improvement in cognition and mobility  Acute hypoxemic respiratory failure/new tracheostomy tube requirement -Decreased frequency of secretions and suctioning but still more than once per shift -He has #6 cuffless trach and SLP assisting with PMV training-PCCM suggesting if secretions remain decreased consider transitioning to #4 trach -Trach team following peripherally -Tracheostomy tube placed on 10/3 and has been in place x30 days  Dysphagia 2/2 anoxic brain injury -Once tolerating PMV can assess swallowing more thoroughly -Continue tube feeding per PEG  Hypertension/grade 1 diastolic dysfunction:  -Blood pressure stable on amlodipine  -Echocardiogram this admission with preserved LVEF with evidence of grade 1 diastolic dysfunction. No physiology currently consistent with heart failure  HIV:  -CD4 count of 124.  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   -Continue Keppra -Continue seizure precautions; no further seizure activity since transitioned out of ICU setting  Inguinal hernias -Evaluated by  general surgery this admission. Documented as chronically incarcerated. Patient has been clinically stable and tolerating tube feedings and being bowel movements so unless patient obstructed would not be considered a candidate for repair. Even if obstructed consulting surgeon was not sure he would be a candidate for hernia repair regardless  Goals of care:  -Palliative care was involved during this hospitalization.   -Goals of care were discussed. remains full code.  - Palliative care recommended outpatient follow-up with palliative providers.  Protein calorie malnutrition: Nutrition Status: Nutrition Problem: Increased nutrient needs Etiology: acute illness Signs/Symptoms: estimated needs Interventions: Tube feeding Estimated body mass index is 25.73 kg/m as calculated from the following:   Height as of this encounter: 5' 6"  (1.676 m).   Weight as of this encounter: 72.3 kg.  Multiple decubitus ulcers not POA Pressure Injury 11/13/19 Anus Stage 3 -  Full thickness tissue loss. Subcutaneous fat may be visible but bone, tendon or muscle are NOT exposed. (Active)  Date First Assessed/Time First Assessed: 11/13/19 1000   Location: Anus  Staging: Stage 3 -  Full thickness tissue loss. Subcutaneous fat may be visible but bone, tendon or muscle are NOT exposed.  Present on Admission: No    Assessments 11/17/2019  8:00 AM 12/13/2019 10:00 AM  Dressing Type None --  Site / Wound Assessment Pink --  Peri-wound Assessment Intact --  Wound Length (cm) -- 1 cm  Wound Width (cm) -- 0.5 cm  Wound Depth (cm) -- 0 cm  Wound Surface Area (cm^2) -- 0.5 cm^2  Wound Volume (cm^3) -- 0 cm^3  Drainage  Amount None --     No Linked orders to display     Wound / Incision (Open or Dehisced) 11/17/19 Puncture Abdomen Left;Anterior;Upper G-tube insertion site  (Active)  Date First Assessed/Time First Assessed: 11/17/19 1646   Wound Type: Puncture  Location: Abdomen  Location Orientation: Left;Anterior;Upper   Wound Description (Comments): G-tube insertion site   Present on Admission: No    Assessments 11/17/2019  4:47 PM 12/13/2019 10:00 AM  Dressing Type Gauze (Comment);Tape dressing --  Dressing Changed New --  Dressing Status Clean;Dry;Intact --  Dressing Change Frequency PRN --  Site / Wound Assessment Clean;Dry;Pink --  Peri-wound Assessment Intact --  Wound Length (cm) -- 0 cm  Wound Width (cm) -- 0 cm  Wound Depth (cm) -- 0 cm  Wound Volume (cm^3) -- 0 cm^3  Wound Surface Area (cm^2) -- 0 cm^2  Closure None --  Drainage Amount None --  Treatment Cleansed --     No Linked orders to display     Pressure Injury 12/01/19 Ankle Left;Lateral Stage 2 -  Partial thickness loss of dermis presenting as a shallow open injury with a red, pink wound bed without slough. (Active)  Date First Assessed/Time First Assessed: 12/01/19 2002   Location: Ankle  Location Orientation: Left;Lateral  Staging: Stage 2 -  Partial thickness loss of dermis presenting as a shallow open injury with a red, pink wound bed without slough.    Assessments 12/01/2019  7:52 PM 12/13/2019 10:00 AM  Dressing Type Foam - Lift dressing to assess site every shift --  Dressing Clean;Dry;Intact --  Site / Wound Assessment Pink --  Peri-wound Assessment Intact --  Wound Length (cm) -- 2.5 cm  Wound Width (cm) -- 0.5 cm  Wound Depth (cm) -- 0 cm  Wound Surface Area (cm^2) -- 1.25 cm^2  Wound Volume (cm^3) -- 0 cm^3  Margins Unattached edges (unapproximated) --  Drainage Amount Scant --  Drainage Description Serosanguineous --  Treatment Cleansed --     No Linked orders to display     Data Reviewed: Basic Metabolic Panel: No results for input(s): NA, K, CL, CO2, GLUCOSE, BUN, CREATININE, CALCIUM, MG, PHOS in the last 168 hours. Liver Function Tests: No results for input(s): AST, ALT, ALKPHOS, BILITOT, PROT, ALBUMIN in the last 168 hours. No results for input(s): LIPASE, AMYLASE in the last 168 hours. No results for  input(s): AMMONIA in the last 168 hours. CBC: No results for input(s): WBC, NEUTROABS, HGB, HCT, MCV, PLT in the last 168 hours. Cardiac Enzymes: No results for input(s): CKTOTAL, CKMB, CKMBINDEX, TROPONINI in the last 168 hours. BNP (last 3 results) No results for input(s): BNP in the last 8760 hours.  ProBNP (last 3 results) No results for input(s): PROBNP in the last 8760 hours.  CBG: Recent Labs  Lab 12/12/19 1954 12/12/19 2330 12/13/19 0441 12/13/19 0837 12/13/19 1137  GLUCAP 97 110* 104* 128* 110*    No results found for this or any previous visit (from the past 240 hour(s)).   Studies: No results found.  Scheduled Meds: . amLODipine  10 mg Per Tube Daily  . bictegravir-emtricitabine-tenofovir AF  1 tablet Oral Daily  . chlorhexidine gluconate (MEDLINE KIT)  15 mL Mouth Rinse BID  . Chlorhexidine Gluconate Cloth  6 each Topical Q0600  . feeding supplement (PROSource TF)  45 mL Per Tube TID  . folic acid  1 mg Per Tube Daily  . free water  200 mL Per Tube Q4H  . heparin injection (  subcutaneous)  5,000 Units Subcutaneous Q8H  . levETIRAcetam  1,500 mg Per Tube BID  . mouth rinse  15 mL Mouth Rinse 10 times per day  . nutrition supplement (JUVEN)  1 packet Oral BID BM  . scopolamine  1 patch Transdermal Q72H  . sulfamethoxazole-trimethoprim  1 tablet Per Tube Once per day on Mon Wed Fri  . thiamine  100 mg Per Tube Daily   Continuous Infusions: . feeding supplement (OSMOLITE 1.5 CAL) 1,000 mL (12/12/19 1200)    Active Problems:   Cardiac arrest ([place])   Community acquired pneumonia of left upper lobe of lung   Anoxic brain injury ([place])   Alcohol abuse   Acute respiratory failure with hypoxia ([facility])   Vomiting   Pressure injury of skin   Small bowel obstruction ([place])   Palliative care encounter   Bowel obstruction ([facility])   Chronic respiratory failure ([place])   Diastolic dysfunction   Status post tracheostomy ([place])   Dysphagia   Protein-calorie malnutrition  ([place])   Seizures ([place])   Bilateral recurrent inguinal hernia   Consultants:  PCCM  Palliative medicine  General surgery  Neurology  Interventional radiology  Procedures:  EEG  Echocardiogram  Tracheostomy  Antibiotics: Anti-infectives (From admission, onward)   Start     Dose/Rate Route Frequency Ordered Stop   12/13/19 1000  bictegravir-emtricitabine-tenofovir AF (BIKTARVY) 50-200-25 MG per tablet 1 tablet        1 tablet Oral Daily 12/12/19 1413     11/17/19 1548  ceFAZolin (ANCEF) 2-4 GM/100ML-% IVPB       Note to Pharmacy: Domenick Bookbinder   : cabinet override      11/17/19 1548 11/18/19 0359   11/16/19 1515  ceFAZolin (ANCEF) IVPB 2g/100 mL premix        2 g 200 mL/hr over 30 Minutes Intravenous To Radiology 11/16/19 1511 11/17/19 1625   11/15/19 0900  sulfamethoxazole-trimethoprim (BACTRIM DS) 800-160 MG per tablet 1 tablet        1 tablet Per Tube Once per day on Mon Wed Fri 11/13/19 1906     11/13/19 1615  dolutegravir (TIVICAY) tablet 50 mg  Status:  Discontinued        50 mg Oral Daily 11/13/19 1521 12/12/19 1413   11/13/19 1615  emtricitabine-tenofovir AF (DESCOVY) 200-25 MG per tablet 1 tablet  Status:  Discontinued        1 tablet Per Tube Daily 11/13/19 1521 12/12/19 1413   11/13/19 1530  sulfamethoxazole-trimethoprim (BACTRIM DS) 800-160 MG per tablet 1 tablet  Status:  Discontinued        1 tablet Oral Once per day on Mon Wed Fri 11/13/19 1441 11/13/19 1906   11/13/19 1500  bictegravir-emtricitabine-tenofovir AF (BIKTARVY) 50-200-25 MG per tablet 1 tablet  Status:  Discontinued        1 tablet Oral Daily 11/13/19 1403 11/13/19 1521   11/10/19 1000  erythromycin 250 mg in sodium chloride 0.9 % 100 mL IVPB        250 mg 100 mL/hr over 60 Minutes Intravenous Every 8 hours 11/10/19 0907 11/11/19 2000   11/07/19 1200  ceFEPIme (MAXIPIME) 2 g in sodium chloride 0.9 % 100 mL IVPB        2 g 200 mL/hr over 30 Minutes Intravenous Every 8 hours 11/07/19 1013  11/13/19 2127   11/03/19 1200  cefTRIAXone (ROCEPHIN) 2 g in sodium chloride 0.9 % 100 mL IVPB        2 g 200 mL/hr over  30 Minutes Intravenous Every 24 hours 11/03/19 0922 11/05/19 1427   11/02/19 0800  vancomycin (VANCOREADY) IVPB 750 mg/150 mL  Status:  Discontinued        750 mg 150 mL/hr over 60 Minutes Intravenous Every 12 hours 11/01/19 1935 11/02/19 1105   11/02/19 0600  piperacillin-tazobactam (ZOSYN) IVPB 3.375 g  Status:  Discontinued        3.375 g 12.5 mL/hr over 240 Minutes Intravenous Every 8 hours 11/02/19 0311 11/03/19 0920   11/01/19 2200  ceFEPIme (MAXIPIME) 2 g in sodium chloride 0.9 % 100 mL IVPB  Status:  Discontinued        2 g 200 mL/hr over 30 Minutes Intravenous Every 12 hours 11/01/19 2143 11/02/19 0311   11/01/19 1915  vancomycin (VANCOREADY) IVPB 1500 mg/300 mL        1,500 mg 150 mL/hr over 120 Minutes Intravenous  Once 11/01/19 1909 11/01/19 2147   11/01/19 1845  cefTRIAXone (ROCEPHIN) 1 g in sodium chloride 0.9 % 100 mL IVPB        1 g 200 mL/hr over 30 Minutes Intravenous  Once 11/01/19 1842 11/01/19 2051   11/01/19 1845  azithromycin (ZITHROMAX) 500 mg in sodium chloride 0.9 % 250 mL IVPB        500 mg 250 mL/hr over 60 Minutes Intravenous  Once 11/01/19 1842 11/01/19 2051       Time spent: 15 minutes    Erin Hearing ANP  [facility] Pager [phone]. If 7PM-7AM, please contact night-coverage at [email or web address] 12/13/2019, 12:37 PM  LOS: 42 days

## 2019-12-14 DIAGNOSIS — J9611 Chronic respiratory failure with hypoxia: Secondary | ICD-10-CM

## 2019-12-14 LAB — GLUCOSE, CAPILLARY
Glucose-Capillary: 131 mg/dL — ABNORMAL HIGH (ref 70–99)
Glucose-Capillary: 120 mg/dL — ABNORMAL HIGH (ref 70–99)
Glucose-Capillary: 130 mg/dL — ABNORMAL HIGH (ref 70–99)
Glucose-Capillary: 101 mg/dL — ABNORMAL HIGH (ref 70–99)
Glucose-Capillary: 104 mg/dL — ABNORMAL HIGH (ref 70–99)
Glucose-Capillary: 119 mg/dL — ABNORMAL HIGH (ref 70–99)

## 2019-12-14 NOTE — Progress Notes (Addendum)
[facility] PROGRESS NOTE  Ryan Orozco QAS:[id number] DOB: 09-25-1965 DOA: 11/01/2019 PCP: Default, Provider, MD  Status: Remains inpatient appropriate because:Altered mental status, Unsafe d/c plan and Inpatient level of care appropriate due to severity of illness. Patient newly diagnosed with anoxic brain injury. Still has excessive tracheal secretions patient is managing primarily by strong cough to expectorate.  Dispo: The patient is from: Home              Anticipated d/c is to: SNF              Anticipated d/c date is: > 3 days              Patient currently is not medically stable to d/c. Patient currently requiring frequent suctioning therefore at this time is not a candidate for discharge. PMV training in process. Medicaid application in process. Patient will also need to have disability paperwork completed since do not expect full recovery.  Tube has been in place greater than 30 days.   Code Status: Full Family Communication: No family at bedside-earlier in week spoke with patient's Sister Ryan Orozco at bedside DVT prophylaxis: Subcutaneous heparin Vaccination status: Does not appear to have received Covid vaccine but will need to confirm with family  Foley catheter: No  HPI: 54 year old male with HIV, chronic alcohol abuse who was presented to the emergency department after being found unresponsive outside a convinient store.  EMS found him pulseless in PEA, unknown downtime, successfully resuscitated and brought to the emergency department.  UDS positive for THC/benzodiazepines.  CT head unremarkable.  Failed extubation trials due to severe anoxic brain injury and had tracheostomy placed.  PEG placed on 10/15.  Now the plan is for placement in a skilled nursing facility/LTAC.  Prolonged hospital course.  Difficult placement. TOC following  Significant Hospital Events   9/29 Admit post PEA arrest. UDS positive for THC, benzo's. ETOH 177 10/05 EEG ongoing. Versed  restarted overnight due to agitation. Nicardipine increased.  10/06 Developed tachypnea with WUA, no follow commands off sedation  10/07 Vomiting, TF held / restarted with recurrent vomiting 10/16 tracheostomy collar for 9 hours 10/18->10/19 off vent all night  10/20> TC  Subjective: More sleepy today and less interactive.  Objective: Vitals:   12/14/19 0833 12/14/19 1141  BP:    Pulse: (!) 108 83  Resp: 20 20  Temp:    SpO2: 95% 95%    Intake/Output Summary (Last 24 hours) at 12/14/2019 1159 Last data filed at 12/14/2019 0358 Gross per 24 hour  Intake --  Output 150 ml  Net -150 ml   Filed Weights   12/13/19 0528 12/13/19 2257 12/14/19 0114  Weight: 72.3 kg 73.5 kg 73.9 kg    Exam: Constitutional: NAD, calm, appears to be comfortable sleepy Eyes: PERRL, lids and conjunctivae normal, previous disconjugate gaze currently not seen Respiratory: Clear to auscultation bilaterally, #6 cuffless trach in place, trach collar in place 21% FiO2-she has coughed a large volume of white opaque secretions out of trach on two trach dressing Cardiovascular: Regular rate and rhythm, no murmurs / rubs / gallops. No extremity edema. 2+ pedal pulses.  Abdomen: no tenderness with palpation, PEG tube with tube feeding infusing, bowel sounds positive.  Musculoskeletal: no clubbing / cyanosis. No joint deformity upper and lower extremities. no contractures; moves upper extremities spontaneously and on command but activity appears to be originating from the shoulder girth with mild ataxia detected,decreased muscle tone both upper and lower extremities  Skin: Several decubiti as described  below Neurologic: CN 2-12 grossly intact definitive bilateral disconjugate gaze today,. Sensation appears to be intact both upper and lower extremities, difficult to test strength in upper extremities but appears to be 2/5 with lower extremities 2-3/5 Psychiatric: Awake and, appears to be oriented to name.  Smiled  appropriately.   Assessment/Plan: Anoxic brain injury 2/2 PEA arrest:  -Presumed 2/2 alcohol/benzodiazepines overdose.  UDS positive for THC/benzodiazepines.  Elevated ethanol level on presentation. -Suspect lifelong sequelae from this event and do not expect patient to recover to baseline mentation; long-term placement in SNF expected -Continue OT/PT/SLP-11/8 patient more alert and able to participate with therapies documented improvement in cognition and mobility -Continues to have waxing and waning alertness variable participation with therapies  Acute hypoxemic respiratory failure/new tracheostomy tube requirement -Decreased frequency of secretions and suctioning but still more than once per shift -He has #6 cuffless trach and SLP assisting with PMV training-PCCM suggesting if secretions remain decreased consider transitioning to #4 trach -Continues to have increased secretions but is able to manage by coughing out of trach as opposed to requiring suctioning. -Trach team following peripherally -Tracheostomy tube placed on 10/3 and has been in place x30 days  Dysphagia 2/2 anoxic brain injury -Once tolerating PMV can assess swallowing more thoroughly -Continue tube feeding per PEG  Hypertension/grade 1 diastolic dysfunction:  -Blood pressure stable on amlodipine  -Echocardiogram this admission with preserved LVEF with evidence of grade 1 diastolic dysfunction. No physiology currently consistent with heart failure  HIV:  -CD4 count of 124.  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   -Continue Keppra -Continue seizure precautions; no further seizure activity since transitioned out of ICU setting  Inguinal hernias -Evaluated by general surgery this admission. Documented as chronically incarcerated. Patient has been clinically stable and tolerating tube feedings  and being bowel movements so unless patient obstructed would not be considered a candidate for repair. Even if obstructed consulting surgeon was not sure he would be a candidate for hernia repair regardless  Goals of care:  -Palliative care was involved during this hospitalization.   -Goals of care were discussed. remains full code.  - Palliative care recommended outpatient follow-up with palliative providers.  Protein calorie malnutrition: Nutrition Status: Nutrition Problem: Increased nutrient needs Etiology: acute illness Signs/Symptoms: estimated needs Interventions: Tube feeding Estimated body mass index is 26.31 kg/m as calculated from the following:   Height as of this encounter: 5' 6"  (1.676 m).   Weight as of this encounter: 73.9 kg.  Multiple decubitus ulcers not POA Pressure Injury 11/13/19 Anus Stage 3 -  Full thickness tissue loss. Subcutaneous fat may be visible but bone, tendon or muscle are NOT exposed. (Active)  Date First Assessed/Time First Assessed: 11/13/19 1000   Location: Anus  Staging: Stage 3 -  Full thickness tissue loss. Subcutaneous fat may be visible but bone, tendon or muscle are NOT exposed.  Present on Admission: No    Assessments 11/17/2019  8:00 AM 12/13/2019 10:00 AM  Dressing Type None --  Site / Wound Assessment Pink --  Peri-wound Assessment Intact --  Wound Length (cm) -- 1 cm  Wound Width (cm) -- 0.5 cm  Wound Depth (cm) -- 0 cm  Wound Surface Area (cm^2) -- 0.5 cm^2  Wound Volume (cm^3) -- 0 cm^3  Drainage Amount None --     No Linked orders to display     Wound / Incision (  Open or Dehisced) 11/17/19 Puncture Abdomen Left;Anterior;Upper G-tube insertion site  (Active)  Date First Assessed/Time First Assessed: 11/17/19 1646   Wound Type: Puncture  Location: Abdomen  Location Orientation: Left;Anterior;Upper  Wound Description (Comments): G-tube insertion site   Present on Admission: No    Assessments 11/17/2019  4:47 PM 12/13/2019 10:00  AM  Dressing Type Gauze (Comment);Tape dressing --  Dressing Changed New --  Dressing Status Clean;Dry;Intact --  Dressing Change Frequency PRN --  Site / Wound Assessment Clean;Dry;Pink --  Peri-wound Assessment Intact --  Wound Length (cm) -- 0 cm  Wound Width (cm) -- 0 cm  Wound Depth (cm) -- 0 cm  Wound Volume (cm^3) -- 0 cm^3  Wound Surface Area (cm^2) -- 0 cm^2  Closure None --  Drainage Amount None --  Treatment Cleansed --     No Linked orders to display     Pressure Injury 12/01/19 Ankle Left;Lateral Stage 2 -  Partial thickness loss of dermis presenting as a shallow open injury with a red, pink wound bed without slough. (Active)  Date First Assessed/Time First Assessed: 12/01/19 2002   Location: Ankle  Location Orientation: Left;Lateral  Staging: Stage 2 -  Partial thickness loss of dermis presenting as a shallow open injury with a red, pink wound bed without slough.    Assessments 12/01/2019  7:52 PM 12/13/2019 10:00 AM  Dressing Type Foam - Lift dressing to assess site every shift --  Dressing Clean;Dry;Intact --  Site / Wound Assessment Pink --  Peri-wound Assessment Intact --  Wound Length (cm) -- 2.5 cm  Wound Width (cm) -- 0.5 cm  Wound Depth (cm) -- 0 cm  Wound Surface Area (cm^2) -- 1.25 cm^2  Wound Volume (cm^3) -- 0 cm^3  Margins Unattached edges (unapproximated) --  Drainage Amount Scant --  Drainage Description Serosanguineous --  Treatment Cleansed --     No Linked orders to display     Data Reviewed: Basic Metabolic Panel: No results for input(s): NA, K, CL, CO2, GLUCOSE, BUN, CREATININE, CALCIUM, MG, PHOS in the last 168 hours. Liver Function Tests: No results for input(s): AST, ALT, ALKPHOS, BILITOT, PROT, ALBUMIN in the last 168 hours. No results for input(s): LIPASE, AMYLASE in the last 168 hours. No results for input(s): AMMONIA in the last 168 hours. CBC: No results for input(s): WBC, NEUTROABS, HGB, HCT, MCV, PLT in the last 168  hours. Cardiac Enzymes: No results for input(s): CKTOTAL, CKMB, CKMBINDEX, TROPONINI in the last 168 hours. BNP (last 3 results) No results for input(s): BNP in the last 8760 hours.  ProBNP (last 3 results) No results for input(s): PROBNP in the last 8760 hours.  CBG: Recent Labs  Lab 12/13/19 2043 12/13/19 2301 12/14/19 0344 12/14/19 0825 12/14/19 1139  GLUCAP 108* 125* 131* 120* 130*    No results found for this or any previous visit (from the past 240 hour(s)).   Studies: No results found.  Scheduled Meds: . amLODipine  10 mg Per Tube Daily  . bictegravir-emtricitabine-tenofovir AF  1 tablet Oral Daily  . chlorhexidine gluconate (MEDLINE KIT)  15 mL Mouth Rinse BID  . Chlorhexidine Gluconate Cloth  6 each Topical Q0600  . feeding supplement (PROSource TF)  45 mL Per Tube TID  . folic acid  1 mg Per Tube Daily  . free water  200 mL Per Tube Q4H  . heparin injection (subcutaneous)  5,000 Units Subcutaneous Q8H  . levETIRAcetam  1,500 mg Per Tube BID  . mouth rinse  15 mL Mouth Rinse 10 times per day  . nutrition supplement (JUVEN)  1 packet Oral BID BM  . scopolamine  1 patch Transdermal Q72H  . sulfamethoxazole-trimethoprim  1 tablet Per Tube Once per day on Mon Wed Fri  . thiamine  100 mg Per Tube Daily   Continuous Infusions: . feeding supplement (OSMOLITE 1.5 CAL) 1,000 mL (12/12/19 1200)    Active Problems:   Cardiac arrest ([place])   Community acquired pneumonia of left upper lobe of lung   Anoxic brain injury ([place])   Alcohol abuse   Acute respiratory failure with hypoxia ([facility])   Vomiting   Pressure injury of skin   Small bowel obstruction ([place])   Palliative care encounter   Bowel obstruction ([facility])   Chronic respiratory failure ([place])   Diastolic dysfunction   Tracheostomy dependent ([place])   Dysphagia   Protein-calorie malnutrition ([place])   Seizures ([place])   Bilateral recurrent inguinal hernia   Consultants:  PCCM  Palliative medicine  General  surgery  Neurology  Interventional radiology  Procedures:  EEG  Echocardiogram  Tracheostomy  Antibiotics: Anti-infectives (From admission, onward)   Start     Dose/Rate Route Frequency Ordered Stop   12/13/19 1000  bictegravir-emtricitabine-tenofovir AF (BIKTARVY) 50-200-25 MG per tablet 1 tablet        1 tablet Oral Daily 12/12/19 1413     11/17/19 1548  ceFAZolin (ANCEF) 2-4 GM/100ML-% IVPB       Note to Pharmacy: Domenick Bookbinder   : cabinet override      11/17/19 1548 11/18/19 0359   11/16/19 1515  ceFAZolin (ANCEF) IVPB 2g/100 mL premix        2 g 200 mL/hr over 30 Minutes Intravenous To Radiology 11/16/19 1511 11/17/19 1625   11/15/19 0900  sulfamethoxazole-trimethoprim (BACTRIM DS) 800-160 MG per tablet 1 tablet        1 tablet Per Tube Once per day on Mon Wed Fri 11/13/19 1906     11/13/19 1615  dolutegravir (TIVICAY) tablet 50 mg  Status:  Discontinued        50 mg Oral Daily 11/13/19 1521 12/12/19 1413   11/13/19 1615  emtricitabine-tenofovir AF (DESCOVY) 200-25 MG per tablet 1 tablet  Status:  Discontinued        1 tablet Per Tube Daily 11/13/19 1521 12/12/19 1413   11/13/19 1530  sulfamethoxazole-trimethoprim (BACTRIM DS) 800-160 MG per tablet 1 tablet  Status:  Discontinued        1 tablet Oral Once per day on Mon Wed Fri 11/13/19 1441 11/13/19 1906   11/13/19 1500  bictegravir-emtricitabine-tenofovir AF (BIKTARVY) 50-200-25 MG per tablet 1 tablet  Status:  Discontinued        1 tablet Oral Daily 11/13/19 1403 11/13/19 1521   11/10/19 1000  erythromycin 250 mg in sodium chloride 0.9 % 100 mL IVPB        250 mg 100 mL/hr over 60 Minutes Intravenous Every 8 hours 11/10/19 0907 11/11/19 2000   11/07/19 1200  ceFEPIme (MAXIPIME) 2 g in sodium chloride 0.9 % 100 mL IVPB        2 g 200 mL/hr over 30 Minutes Intravenous Every 8 hours 11/07/19 1013 11/13/19 2127   11/03/19 1200  cefTRIAXone (ROCEPHIN) 2 g in sodium chloride 0.9 % 100 mL IVPB        2 g 200 mL/hr over  30 Minutes Intravenous Every 24 hours 11/03/19 0922 11/05/19 1427   11/02/19 0800  vancomycin (VANCOREADY) IVPB 750 mg/150 mL  Status:  Discontinued        750 mg 150 mL/hr over 60 Minutes Intravenous Every 12 hours 11/01/19 1935 11/02/19 1105   11/02/19 0600  piperacillin-tazobactam (ZOSYN) IVPB 3.375 g  Status:  Discontinued        3.375 g 12.5 mL/hr over 240 Minutes Intravenous Every 8 hours 11/02/19 0311 11/03/19 0920   11/01/19 2200  ceFEPIme (MAXIPIME) 2 g in sodium chloride 0.9 % 100 mL IVPB  Status:  Discontinued        2 g 200 mL/hr over 30 Minutes Intravenous Every 12 hours 11/01/19 2143 11/02/19 0311   11/01/19 1915  vancomycin (VANCOREADY) IVPB 1500 mg/300 mL        1,500 mg 150 mL/hr over 120 Minutes Intravenous  Once 11/01/19 1909 11/01/19 2147   11/01/19 1845  cefTRIAXone (ROCEPHIN) 1 g in sodium chloride 0.9 % 100 mL IVPB        1 g 200 mL/hr over 30 Minutes Intravenous  Once 11/01/19 1842 11/01/19 2051   11/01/19 1845  azithromycin (ZITHROMAX) 500 mg in sodium chloride 0.9 % 250 mL IVPB        500 mg 250 mL/hr over 60 Minutes Intravenous  Once 11/01/19 1842 11/01/19 2051       Time spent: 15 minutes    Erin Hearing ANP  [facility] Pager [phone]. If 7PM-7AM, please contact night-coverage at [email or web address] 12/14/2019, 11:59 AM  LOS: 43 days

## 2019-12-14 NOTE — Plan of Care (Signed)

## 2019-12-15 LAB — GLUCOSE, CAPILLARY
Glucose-Capillary: 99 mg/dL (ref 70–99)
Glucose-Capillary: 97 mg/dL (ref 70–99)
Glucose-Capillary: 120 mg/dL — ABNORMAL HIGH (ref 70–99)
Glucose-Capillary: 97 mg/dL (ref 70–99)
Glucose-Capillary: 103 mg/dL — ABNORMAL HIGH (ref 70–99)
Glucose-Capillary: 87 mg/dL (ref 70–99)

## 2019-12-15 MED ORDER — NYSTATIN 100000 UNIT/GM EX POWD
Freq: Three times a day (TID) | CUTANEOUS | Status: DC
  Filled 2019-12-15: qty 15

## 2019-12-15 NOTE — TOC Progression Note (Signed)
Transition of Care [facility]) - Progression Note    Patient Details  Name: Ryan Orozco MRN: [id number] Date of Birth: 1965-11-28  Transition of [facility]) CM/SW Contact  Beckie Busing, RN Phone Number: [phone]  12/15/2019, 1:19 PM  Clinical Narrative:    Patients info has been faxed out to facilities for bed offers under letter of guarantee.    Expected Discharge Plan: Long Term Acute Care (LTAC) Barriers to Discharge: Continued Medical Work up  Expected Discharge Plan and Services Expected Discharge Plan: Long Term Acute Care (LTAC)   Discharge Planning Services: CM Consult   Living arrangements for the past 2 months: Single Family Home                                       Social Determinants of Health (SDOH) Interventions    Readmission Risk Interventions No flowsheet data found.

## 2019-12-15 NOTE — Progress Notes (Signed)
Physical Therapy Treatment Patient Details Name: Ryan Orozco MRN: [id number] DOB: September 01, 1965 Today's Date: 12/15/2019    History of Present Illness 54 year old male found down outside convenience store 9/27. Pt with PEA and hypoxic brain injury. Trach 10/13, PEG 10/15. PMHx: HIV AIDS and alcohol abuse    PT Comments    Pt restless in bed, flexed posture, laughing several times throughout (at inappropriate moments, but good to see more than flat affect.  Pt presents like a Rancho V TBI (although he is an anoxic BI).  Seems to attempt to follow some commands, but attention is short and physically he is limited as well.  He was able to sit EOB for ~10 mins, but resisted therapist and kept trying to lay down.  Pt remains appropriate for SNF placement at discharge.  Goals are due next session.    Follow Up Recommendations  SNF     Equipment Recommendations  Wheelchair (measurements PT);Wheelchair cushion (measurements PT);Hospital bed;Other (comment) (hoyer lift)    Recommendations for Other Services       Precautions / Restrictions Precautions Precautions: Fall Precaution Comments: trach, PEG, anoxic, flexor tone UE and  LE    Mobility  Bed Mobility Overal bed mobility: Needs Assistance Bed Mobility: Rolling;Supine to Sit;Sit to Supine Rolling: Max assist   Supine to sit: Max assist;HOB elevated Sit to supine: Max assist;HOB elevated   General bed mobility comments: Max assist to roll half a dozen times for peri care and bed/pt clean up as he urinated all over the bed (while laughing).   Transfers                 General transfer comment: would require two person assist and/or a lift.  Ambulation/Gait                 Stairs             Wheelchair Mobility    Modified Rankin (Stroke Patients Only)       Balance Overall balance assessment: Needs assistance Sitting-balance support: Feet supported;Bilateral upper extremity supported Sitting  balance-Leahy Scale: Zero Sitting balance - Comments: max assist EOB, pt kept pushing back to supine, therapist would bring him back up.  EOB ~10 mins, R LE seemed to be in more flexion tone today, however, both legs flexed in fetal position in bed.  Postural control: Right lateral lean                                  Cognition Arousal/Alertness: Awake/alert Behavior During Therapy: Restless Overall Cognitive Status: Impaired/Different from baseline Area of Impairment: Orientation;Attention;Memory;Following commands;Safety/judgement;Awareness;Problem solving               Rancho Levels of Cognitive Functioning Ranch[place]igos Scales of Cognitive Functioning: Confused/inappropriate/non-agitated Orientation Level: Disoriented to;Place;Time;Situation Current Attention Level: Focused Memory: Decreased short-term memory Following Commands: Follows one step commands inconsistently;Follows one step commands with increased time Safety/Judgement: Decreased awareness of safety;Decreased awareness of deficits Awareness: Intellectual Problem Solving: Slow processing;Decreased initiation;Difficulty sequencing;Requires verbal cues;Requires tactile cues General Comments: Alert, restless in the bed, kicking legs over the side, starting to laugh (at inappropriate times, when peeing in the bed, when resisting therapists attempts to keep him sitting)/      Exercises      General Comments        Pertinent Vitals/Pain      Home Living  Prior Function            PT Goals (current goals can now be found in the care plan section) Acute Rehab PT Goals Patient Stated Goal: none stated Progress towards PT goals: Progressing toward goals    Frequency    Min 2X/week      PT Plan Current plan remains appropriate    Co-evaluation              AM-PAC PT "6 Clicks" Mobility   Outcome Measure  Help needed turning from your back to  your side while in a flat bed without using bedrails?: Total Help needed moving from lying on your back to sitting on the side of a flat bed without using bedrails?: Total Help needed moving to and from a bed to a chair (including a wheelchair)?: Total Help needed standing up from a chair using your arms (e.g., wheelchair or bedside chair)?: Total Help needed to walk in hospital room?: Total Help needed climbing 3-5 steps with a railing? : Total 6 Click Score: 6    End of Session Equipment Utilized During Treatment: Oxygen (28% TC 5L O2) Activity Tolerance: Patient tolerated treatment well Patient left: in bed;with nursing/sitter in room (with RN tech in room finishing his clean up)   PT Visit Diagnosis: Other abnormalities of gait and mobility (R26.89);Muscle weakness (generalized) (M62.81)     Time: 0350-0938 PT Time Calculation (min) (ACUTE ONLY): 24 min  Charges:  $Therapeutic Activity: 23-37 mins                     Corinna Capra, PT, DPT  Acute Rehabilitation [phone] pager [phone]) [phone] office

## 2019-12-15 NOTE — Progress Notes (Signed)
[facility] PROGRESS NOTE  Detrell [place] GHW:[id number] DOB: 25-Mar-1965 DOA: 11/01/2019 PCP: Default, Provider, MD  Status: Remains inpatient appropriate because:Altered mental status, Unsafe d/c plan and Inpatient level of care appropriate due to severity of illness. Patient newly diagnosed with anoxic brain injury. Still has excessive tracheal secretions patient is managing primarily by strong cough to expectorate.  Dispo: The patient is from: Home              Anticipated d/c is to: SNF              Anticipated d/c date is: > 3 days              Patient currently is not medically stable to d/c. Patient currently requiring frequent suctioning therefore at this time is not a candidate for discharge. PMV training in process. Medicaid application in process. Patient will also need to have disability paperwork completed since do not expect full recovery.  Tube has been in place greater than 30 days.   Code Status: Full Family Communication: No family at bedside-earlier in week spoke with patient's Sister [name] at bedside DVT prophylaxis: Subcutaneous heparin Vaccination status: Does not appear to have received Covid vaccine but will need to confirm with family  Foley catheter: No  HPI: 54 year old male with HIV, chronic alcohol abuse who was presented to the emergency department after being found unresponsive outside a convinient store.  EMS found him pulseless in PEA, unknown downtime, successfully resuscitated and brought to the emergency department.  UDS positive for THC/benzodiazepines.  CT head unremarkable.  Failed extubation trials due to severe anoxic brain injury and had tracheostomy placed.  PEG placed on 10/15.  Now the plan is for placement in a skilled nursing facility/LTAC.  Prolonged hospital course.  Difficult placement. TOC following  Significant Hospital Events   9/29 Admit post PEA arrest. UDS positive for THC, benzo's. ETOH 177 10/05 EEG ongoing. Versed  restarted overnight due to agitation. Nicardipine increased.  10/06 Developed tachypnea with WUA, no follow commands off sedation  10/07 Vomiting, TF held / restarted with recurrent vomiting 10/16 tracheostomy collar for 9 hours 10/18->10/19 off vent all night  10/20> TC  Subjective: Alert and smiling.  Briefly attempted to vocalize but was unsuccessful.  Also briefly followed simple commands.  Interactive otherwise.  Objective: Vitals:   12/15/19 0540 12/15/19 0831  BP: 108/73   Pulse: 80 95  Resp: 18 18  Temp: 97.9 F (36.6 C)   SpO2:  94%   No intake or output data in the 24 hours ending 12/15/19 1048 Filed Weights   12/13/19 2257 12/14/19 0114 12/15/19 0500  Weight: 73.5 kg 73.9 kg 73.7 kg    Exam: Constitutional: NAD, calm, appears to be comfortable sleepy Respiratory: Clear to auscultation bilaterally, #6 cuffless trach with 21% FiO2/TC Cardiovascular: Regular rate and rhythm, no murmurs, no extremity edema.  Abdomen: no tenderness with palpation, PEG tube with tube feeding infusing, bowel sounds positive.  Tolerating current tube feeding. Musculoskeletal: No joint deformity upper and lower extremities. no contractures; moves upper extremities spontaneously and on command but activity appears to be originating from the shoulder girth with mild ataxia detected,decreased muscle tone both upper and lower extremities  Skin: Several decubiti as described below Neurologic: CN 2-12 grossly intact definitive bilateral disconjugate gaze today,. Sensation appears to be intact both upper and lower extremities, difficult to test strength in upper extremities but appears to be 2/5 with lower extremities 2-3/5 Psychiatric: Awake and, appears to be oriented  to name.  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   -Continue Keppra-check levetiracetam level -Continue seizure precautions; no further seizure activity since transitioned out of ICU setting  Dysphagia 2/2 anoxic brain injury -Once tolerating PMV can assess swallowing more thoroughly -Continue tube feeding per PEG  Goals of care:  -Palliative care was involved during this hospitalization.   -Goals of care were discussed. remains full code.  - Palliative care recommended outpatient follow-up with palliative providers.  Protein calorie malnutrition nutrition Status: Nutrition Problem: Increased nutrient needs Etiology: acute illness Signs/Symptoms: estimated needs Interventions: Tube feeding Estimated body mass index is 26.22 kg/m as calculated from the following:   Height as of this encounter: 5' 6"  (1.676 m).   Weight as of this encounter: 73.7 kg.   Multiple decubiti not POA Pressure Injury 11/13/19 Anus Stage 3 -  Full thickness tissue loss. Subcutaneous fat may be visible but bone, tendon or muscle are NOT exposed. (Active)  Date First Assessed/Time First Assessed: 11/13/19 1000   Location: Anus  Staging: Stage 3 -  Full thickness tissue loss. Subcutaneous fat may be visible but bone, tendon or muscle are NOT exposed.  Present on Admission: No    Assessments 11/17/2019  8:00 AM 12/13/2019 10:00 AM  Dressing Type None --  Site / Wound Assessment Pink --  Peri-wound Assessment Intact --  Wound Length (cm) -- 1 cm  Wound Width (cm) -- 0.5 cm  Wound Depth (cm) -- 0 cm  Wound Surface Area (cm^2) -- 0.5 cm^2   Wound Volume (cm^3) -- 0 cm^3  Drainage Amount None --     No Linked orders to display     Wound / Incision (Open or Dehisced) 11/17/19 Puncture Abdomen Left;Anterior;Upper G-tube insertion site  (Active)  Date First Assessed/Time First Assessed: 11/17/19 1646   Wound Type: Puncture  Location: Abdomen  Location Orientation: Left;Anterior;Upper  Wound Description (Comments): G-tube insertion site   Present on Admission: No    Assessments 11/17/2019  4:47 PM 12/13/2019 10:00 AM  Dressing Type Gauze (Comment);Tape dressing --  Dressing Changed New --  Dressing Status Clean;Dry;Intact --  Dressing Change Frequency PRN --  Site / Wound Assessment Clean;Dry;Pink --  Peri-wound Assessment Intact --  Wound Length (cm) -- 0 cm  Wound Width (cm) -- 0 cm  Wound Depth (cm) -- 0 cm  Wound Volume (cm^3) -- 0 cm^3  Wound Surface Area (cm^2) -- 0 cm^2  Closure None --  Drainage Amount None --  Treatment Cleansed --     No Linked orders to display     Pressure Injury 12/01/19 Ankle Left;Lateral Stage 2 -  Partial thickness loss of dermis presenting as a shallow open injury with a red, pink wound bed without slough. (Active)  Date First Assessed/Time First Assessed: 12/01/19 2002   Location: Ankle  Location Orientation: Left;Lateral  Staging: Stage 2 -  Partial thickness loss of dermis presenting as a shallow open injury with a red, pink wound bed without slough.  Assessments 12/01/2019  7:52 PM 12/13/2019 10:00 AM  Dressing Type Foam - Lift dressing to assess site every shift --  Dressing Clean;Dry;Intact --  Site / Wound Assessment Pink --  Peri-wound Assessment Intact --  Wound Length (cm) -- 2.5 cm  Wound Width (cm) -- 0.5 cm  Wound Depth (cm) -- 0 cm  Wound Surface Area (cm^2) -- 1.25 cm^2  Wound Volume (cm^3) -- 0 cm^3  Margins Unattached edges (unapproximated) --  Drainage Amount Scant --  Drainage Description Serosanguineous --  Treatment Cleansed --     No Linked orders to  display    Other problems: Hypertension/grade 1 diastolic dysfunction:  -Blood pressure stable on amlodipine  -Echocardiogram this admission with preserved LVEF with evidence of grade 1 diastolic dysfunction. No physiology currently consistent with heart failure  HIV:  -CD4 count of 124.  -Dc'd Tivicay and Descovy infavor of Biktarvy to for eventual dc to SNF -Continue prophylactic Bactrim  Inguinal hernias -Evaluated by general surgery this admission. Documented as chronically incarcerated. Patient has been clinically stable and tolerating tube feedings and having bowel movements so unless patient obstructed would not be considered a candidate for repair. Even if obstructed consulting surgeon was not sure he would be a candidate for hernia repair regardless .   Data Reviewed: Basic Metabolic Panel: No results for input(s): NA, K, CL, CO2, GLUCOSE, BUN, CREATININE, CALCIUM, MG, PHOS in the last 168 hours. Liver Function Tests: No results for input(s): AST, ALT, ALKPHOS, BILITOT, PROT, ALBUMIN in the last 168 hours. No results for input(s): LIPASE, AMYLASE in the last 168 hours. No results for input(s): AMMONIA in the last 168 hours. CBC: No results for input(s): WBC, NEUTROABS, HGB, HCT, MCV, PLT in the last 168 hours. Cardiac Enzymes: No results for input(s): CKTOTAL, CKMB, CKMBINDEX, TROPONINI in the last 168 hours. BNP (last 3 results) No results for input(s): BNP in the last 8760 hours.  ProBNP (last 3 results) No results for input(s): PROBNP in the last 8760 hours.  CBG: Recent Labs  Lab 12/14/19 1541 12/14/19 2042 12/14/19 2302 12/15/19 0407 12/15/19 0753  GLUCAP 101* 104* 119* 99 97    No results found for this or any previous visit (from the past 240 hour(s)).   Studies: No results found.  Scheduled Meds: . amLODipine  10 mg Per Tube Daily  . bictegravir-emtricitabine-tenofovir AF  1 tablet Oral Daily  . chlorhexidine gluconate (MEDLINE KIT)  15 mL  Mouth Rinse BID  . Chlorhexidine Gluconate Cloth  6 each Topical Q0600  . feeding supplement (PROSource TF)  45 mL Per Tube TID  . folic acid  1 mg Per Tube Daily  . free water  200 mL Per Tube Q4H  . heparin injection (subcutaneous)  5,000 Units Subcutaneous Q8H  . levETIRAcetam  1,500 mg Per Tube BID  . mouth rinse  15 mL Mouth Rinse 10 times per day  . nutrition supplement (JUVEN)  1 packet Oral BID BM  . scopolamine  1 patch Transdermal Q72H  . sulfamethoxazole-trimethoprim  1 tablet Per Tube Once per day on Mon Wed Fri  . thiamine  100 mg Per Tube Daily   Continuous Infusions: . feeding supplement (OSMOLITE 1.5 CAL) 1,000 mL (12/14/19 1637)    Active Problems:   Cardiac arrest [facility])   Community acquired pneumonia of left upper lobe of lung   Anoxic brain injury ([place])   Alcohol abuse   Acute respiratory failure with hypoxia ([facility])   Vomiting   Pressure injury  of skin   Small bowel obstruction ([place])   Palliative care encounter   Bowel obstruction ([facility])   Chronic respiratory failure ([facility])   Diastolic dysfunction   Tracheostomy dependent ([place])   Dysphagia   Protein-calorie malnutrition ([place])   Seizures ([place])   Bilateral recurrent inguinal hernia   Consultants:  PCCM  Palliative medicine  General surgery  Neurology  Interventional radiology  Procedures:  EEG  Echocardiogram  Tracheostomy  Antibiotics: Anti-infectives (From admission, onward)   Start     Dose/Rate Route Frequency Ordered Stop   12/13/19 1000  bictegravir-emtricitabine-tenofovir AF (BIKTARVY) 50-200-25 MG per tablet 1 tablet        1 tablet Oral Daily 12/12/19 1413     11/17/19 1548  ceFAZolin (ANCEF) 2-4 GM/100ML-% IVPB       Note to Pharmacy: Domenick Bookbinder   : cabinet override      11/17/19 1548 11/18/19 0359   11/16/19 1515  ceFAZolin (ANCEF) IVPB 2g/100 mL premix        2 g 200 mL/hr over 30 Minutes Intravenous To Radiology 11/16/19 1511 11/17/19 1625   11/15/19 0900   sulfamethoxazole-trimethoprim (BACTRIM DS) 800-160 MG per tablet 1 tablet        1 tablet Per Tube Once per day on Mon Wed Fri 11/13/19 1906     11/13/19 1615  dolutegravir (TIVICAY) tablet 50 mg  Status:  Discontinued        50 mg Oral Daily 11/13/19 1521 12/12/19 1413   11/13/19 1615  emtricitabine-tenofovir AF (DESCOVY) 200-25 MG per tablet 1 tablet  Status:  Discontinued        1 tablet Per Tube Daily 11/13/19 1521 12/12/19 1413   11/13/19 1530  sulfamethoxazole-trimethoprim (BACTRIM DS) 800-160 MG per tablet 1 tablet  Status:  Discontinued        1 tablet Oral Once per day on Mon Wed Fri 11/13/19 1441 11/13/19 1906   11/13/19 1500  bictegravir-emtricitabine-tenofovir AF (BIKTARVY) 50-200-25 MG per tablet 1 tablet  Status:  Discontinued        1 tablet Oral Daily 11/13/19 1403 11/13/19 1521   11/10/19 1000  erythromycin 250 mg in sodium chloride 0.9 % 100 mL IVPB        250 mg 100 mL/hr over 60 Minutes Intravenous Every 8 hours 11/10/19 0907 11/11/19 2000   11/07/19 1200  ceFEPIme (MAXIPIME) 2 g in sodium chloride 0.9 % 100 mL IVPB        2 g 200 mL/hr over 30 Minutes Intravenous Every 8 hours 11/07/19 1013 11/13/19 2127   11/03/19 1200  cefTRIAXone (ROCEPHIN) 2 g in sodium chloride 0.9 % 100 mL IVPB        2 g 200 mL/hr over 30 Minutes Intravenous Every 24 hours 11/03/19 0922 11/05/19 1427   11/02/19 0800  vancomycin (VANCOREADY) IVPB 750 mg/150 mL  Status:  Discontinued        750 mg 150 mL/hr over 60 Minutes Intravenous Every 12 hours 11/01/19 1935 11/02/19 1105   11/02/19 0600  piperacillin-tazobactam (ZOSYN) IVPB 3.375 g  Status:  Discontinued        3.375 g 12.5 mL/hr over 240 Minutes Intravenous Every 8 hours 11/02/19 0311 11/03/19 0920   11/01/19 2200  ceFEPIme (MAXIPIME) 2 g in sodium chloride 0.9 % 100 mL IVPB  Status:  Discontinued        2 g 200 mL/hr over 30 Minutes Intravenous Every 12 hours 11/01/19 2143 11/02/19 0311   11/01/19 1915  vancomycin (VANCOREADY)  IVPB 1500  mg/300 mL        1,500 mg 150 mL/hr over 120 Minutes Intravenous  Once 11/01/19 1909 11/01/19 2147   11/01/19 1845  cefTRIAXone (ROCEPHIN) 1 g in sodium chloride 0.9 % 100 mL IVPB        1 g 200 mL/hr over 30 Minutes Intravenous  Once 11/01/19 1842 11/01/19 2051   11/01/19 1845  azithromycin (ZITHROMAX) 500 mg in sodium chloride 0.9 % 250 mL IVPB        500 mg 250 mL/hr over 60 Minutes Intravenous  Once 11/01/19 1842 11/01/19 2051       Time spent: 15 minutes    Erin Hearing ANP  [facility] Pager [phone]. If 7PM-7AM, please contact night-coverage at [email or web address] 12/15/2019, 10:48 AM  LOS: 44 days

## 2019-12-16 LAB — GLUCOSE, CAPILLARY
Glucose-Capillary: 102 mg/dL — ABNORMAL HIGH (ref 70–99)
Glucose-Capillary: 102 mg/dL — ABNORMAL HIGH (ref 70–99)
Glucose-Capillary: 104 mg/dL — ABNORMAL HIGH (ref 70–99)
Glucose-Capillary: 117 mg/dL — ABNORMAL HIGH (ref 70–99)
Glucose-Capillary: 136 mg/dL — ABNORMAL HIGH (ref 70–99)
Glucose-Capillary: 149 mg/dL — ABNORMAL HIGH (ref 70–99)

## 2019-12-16 NOTE — Plan of Care (Signed)
  Problem: Education: Goal: Knowledge of General Education information will improve Description Including pain rating scale, medication(s)/side effects and non-pharmacologic comfort measures Outcome: Progressing   

## 2019-12-16 NOTE — Progress Notes (Addendum)
[facility] PROGRESS NOTE  Milen [place] BOF:[id number] DOB: [birth year] DOA: 11/01/2019 PCP: Default, Provider, MD  Status: Remains inpatient appropriate because:Altered mental status, Unsafe d/c plan and Inpatient level of care appropriate due to severity of illness. Patient newly diagnosed with anoxic brain injury. Still has excessive tracheal secretions patient is managing primarily by strong cough to expectorate.  Dispo: The patient is from: Home              Anticipated d/c is to: SNF              Anticipated d/c date is: > 3 days              Patient currently is not medically stable to d/c. Patient currently requiring frequent suctioning therefore at this time is not a candidate for discharge. PMV training in process. Medicaid application in process. Patient will also need to have disability paperwork completed since do not expect full recovery.  Tube has been in place greater than 30 days.   Code Status: Full Family Communication: No family at bedside DVT prophylaxis: Subcutaneous heparin Vaccination status: Does not appear to have received Covid vaccine but will need to confirm with family  Foley catheter: No  HPI: 54 year old male with HIV, chronic alcohol abuse who was presented to the emergency department after being found unresponsive outside a convinient store.  EMS found him pulseless in PEA, unknown downtime, successfully resuscitated and brought to the emergency department.  UDS positive for THC/benzodiazepines.  CT head unremarkable.  Failed extubation trials due to severe anoxic brain injury and had tracheostomy placed.  PEG placed on 10/15.  Now the plan is for placement in a skilled nursing facility/LTAC.  Prolonged hospital course.  Difficult placement. TOC following  Significant Hospital Events   9/29 Admit post PEA arrest. UDS positive for THC, benzo's. ETOH 177 10/05 EEG ongoing. Versed restarted overnight due to agitation. Nicardipine increased.  10/06  Developed tachypnea with WUA, no follow commands off sedation  10/07 Vomiting, TF held / restarted with recurrent vomiting 10/16 tracheostomy collar for 9 hours 10/18->10/19 off vent all night  10/20> TC  Subjective: Seen and examined.  Alert but completely incoherent and nonverbal.  Tries to move all extremities.  Objective: Vitals:   12/16/19 0900 12/16/19 1000  BP: 111/65   Pulse: 94   Resp: 20   Temp: 97.8 F (36.6 C) 97.8 F (36.6 C)  SpO2: 98%     Intake/Output Summary (Last 24 hours) at 12/16/2019 1118 Last data filed at 12/16/2019 0341 Gross per 24 hour  Intake 60 ml  Output 750 ml  Net -690 ml   Filed Weights   12/13/19 2257 12/14/19 0114 12/15/19 0500  Weight: 73.5 kg 73.9 kg 73.7 kg    Exam:  General exam: Appears calm and comfortable  Respiratory system: Clear to auscultation. Respiratory effort normal. Cardiovascular system: S1 & S2 heard, RRR. No JVD, murmurs, rubs, gallops or clicks. No pedal edema. Gastrointestinal system: Abdomen is nondistended, soft and nontender. No organomegaly or masses felt. Normal bowel sounds heard.  PEG tube in place. Central nervous system: Alert but not oriented.  High tone in all extremities, more in the lower extremities. Extremities: Symmetric 5 x 5 power.  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   -Continue Keppra-check levetiracetam level  which is pending. -Continue seizure precautions; no further seizure activity since transitioned out of ICU setting  Dysphagia 2/2 anoxic brain injury -Once tolerating PMV can assess swallowing more thoroughly -Continue tube feeding per PEG  Goals of care:  -Palliative care was involved during this hospitalization.   -Goals of care were discussed. remains full code.  - Palliative care recommended outpatient follow-up with palliative providers.  Protein calorie malnutrition nutrition Status: Nutrition Problem: Increased nutrient needs Etiology: acute illness Signs/Symptoms: estimated needs Interventions: Tube feeding Estimated body mass index is 26.22 kg/m as calculated from the following:   Height as of this encounter: 5' 6"  (1.676 m).   Weight as of this encounter: 73.7 kg.   Multiple decubiti not POA Pressure Injury 11/13/19 Anus Stage 3 -  Full thickness tissue loss. Subcutaneous fat may be visible but bone, tendon or muscle are NOT exposed. (Active)  Date First Assessed/Time First Assessed: 11/13/19 1000   Location: Anus  Staging: Stage 3 -  Full thickness tissue loss. Subcutaneous fat may be visible but bone, tendon or muscle are NOT exposed.  Present on Admission: No    Assessments 11/17/2019  8:00 AM 12/15/2019  8:20 PM  Dressing Type None --  Site / Wound Assessment Pink --  Peri-wound Assessment Intact --  Drainage Amount None None     No Linked orders to display     Wound / Incision (Open or Dehisced) 11/17/19 Puncture Abdomen Left;Anterior;Upper G-tube insertion site  (Active)  Date First Assessed/Time First Assessed: 11/17/19 1646   Wound Type: Puncture  Location: Abdomen  Location Orientation: Left;Anterior;Upper  Wound Description (Comments): G-tube insertion site   Present on Admission: No    Assessments 11/17/2019  4:47 PM 12/15/2019  8:31 AM  Dressing Type Gauze (Comment);Tape dressing None  Dressing Changed New --  Dressing Status Clean;Dry;Intact --   Dressing Change Frequency PRN --  Site / Wound Assessment Clean;Dry;Pink --  Peri-wound Assessment Intact --  Closure None --  Drainage Amount None --  Treatment Cleansed --     No Linked orders to display     Pressure Injury 12/01/19 Ankle Left;Lateral Stage 2 -  Partial thickness loss of dermis presenting as a shallow open injury with a red, pink wound bed without slough. (Active)  Date First Assessed/Time First Assessed: 12/01/19 2002   Location: Ankle  Location Orientation: Left;Lateral  Staging: Stage 2 -  Partial thickness loss of dermis presenting as a shallow open injury with a red, pink wound bed without slough.    Assessments 12/01/2019  7:52 PM 12/13/2019 10:00 AM  Dressing Type Foam - Lift dressing to assess site every shift --  Dressing Clean;Dry;Intact --  Site / Wound Assessment Pink --  Peri-wound Assessment Intact --  Wound Length (cm) -- 2.5 cm  Wound Width (cm) -- 0.5 cm  Wound Depth (cm) -- 0 cm  Wound Surface Area (cm^2) -- 1.25 cm^2  Wound Volume (cm^3) -- 0 cm^3  Margins Unattached edges (unapproximated) --  Drainage Amount Scant --  Drainage Description Serosanguineous --  Treatment Cleansed --     No Linked orders to display    Other problems: Hypertension/grade 1 diastolic dysfunction:  -Blood pressure stable on amlodipine  -  Echocardiogram this admission with preserved LVEF with evidence of grade 1 diastolic dysfunction. No physiology currently consistent with heart failure  HIV:  -CD4 count of 124.  -Dc'd Tivicay and Descovy infavor of Biktarvy to for eventual dc to SNF -Continue prophylactic Bactrim  Inguinal hernias -Evaluated by general surgery this admission. Documented as chronically incarcerated. Patient has been clinically stable and tolerating tube feedings and having bowel movements so unless patient obstructed would not be considered a candidate for repair. Even if obstructed consulting surgeon was not sure he would be a candidate for  hernia repair regardless .   Data Reviewed: Basic Metabolic Panel: No results for input(s): NA, K, CL, CO2, GLUCOSE, BUN, CREATININE, CALCIUM, MG, PHOS in the last 168 hours. Liver Function Tests: No results for input(s): AST, ALT, ALKPHOS, BILITOT, PROT, ALBUMIN in the last 168 hours. No results for input(s): LIPASE, AMYLASE in the last 168 hours. No results for input(s): AMMONIA in the last 168 hours. CBC: No results for input(s): WBC, NEUTROABS, HGB, HCT, MCV, PLT in the last 168 hours. Cardiac Enzymes: No results for input(s): CKTOTAL, CKMB, CKMBINDEX, TROPONINI in the last 168 hours. BNP (last 3 results) No results for input(s): BNP in the last 8760 hours.  ProBNP (last 3 results) No results for input(s): PROBNP in the last 8760 hours.  CBG: Recent Labs  Lab 12/15/19 1743 12/15/19 1940 12/15/19 2329 12/16/19 0322 12/16/19 0757  GLUCAP 97 103* 87 149* 102*    No results found for this or any previous visit (from the past 240 hour(s)).   Studies: No results found.  Scheduled Meds: . amLODipine  10 mg Per Tube Daily  . bictegravir-emtricitabine-tenofovir AF  1 tablet Oral Daily  . chlorhexidine gluconate (MEDLINE KIT)  15 mL Mouth Rinse BID  . Chlorhexidine Gluconate Cloth  6 each Topical Q0600  . feeding supplement (PROSource TF)  45 mL Per Tube TID  . folic acid  1 mg Per Tube Daily  . free water  200 mL Per Tube Q4H  . heparin injection (subcutaneous)  5,000 Units Subcutaneous Q8H  . levETIRAcetam  1,500 mg Per Tube BID  . mouth rinse  15 mL Mouth Rinse 10 times per day  . nutrition supplement (JUVEN)  1 packet Oral BID BM  . nystatin   Topical TID  . scopolamine  1 patch Transdermal Q72H  . sulfamethoxazole-trimethoprim  1 tablet Per Tube Once per day on Mon Wed Fri  . thiamine  100 mg Per Tube Daily   Continuous Infusions: . feeding supplement (OSMOLITE 1.5 CAL) 1,000 mL (12/16/19 1497)    Active Problems:   Cardiac arrest [facility])   Community acquired  pneumonia of left upper lobe of lung   Anoxic brain injury ([place])   Alcohol abuse   Acute respiratory failure with hypoxia ([facility])   Vomiting   Pressure injury of skin   Small bowel obstruction ([facility])   Palliative care encounter   Bowel obstruction ([facility])   Chronic respiratory failure ([facility])   Diastolic dysfunction   Tracheostomy dependent ([place])   Dysphagia   Protein-calorie malnutrition ([place])   Seizures ([place])   Bilateral recurrent inguinal hernia   Consultants:  PCCM  Palliative medicine  General surgery  Neurology  Interventional radiology  Procedures:  EEG  Echocardiogram  Tracheostomy  Antibiotics: Anti-infectives (From admission, onward)   Start     Dose/Rate Route Frequency Ordered Stop   12/13/19 1000  bictegravir-emtricitabine-tenofovir AF (BIKTARVY) 50-200-25 MG per tablet 1 tablet  1 tablet Oral Daily 12/12/19 1413     11/17/19 1548  ceFAZolin (ANCEF) 2-4 GM/100ML-% IVPB       Note to Pharmacy: Domenick Bookbinder   : cabinet override      11/17/19 1548 11/18/19 0359   11/16/19 1515  ceFAZolin (ANCEF) IVPB 2g/100 mL premix        2 g 200 mL/hr over 30 Minutes Intravenous To Radiology 11/16/19 1511 11/17/19 1625   11/15/19 0900  sulfamethoxazole-trimethoprim (BACTRIM DS) 800-160 MG per tablet 1 tablet        1 tablet Per Tube Once per day on Mon Wed Fri 11/13/19 1906     11/13/19 1615  dolutegravir (TIVICAY) tablet 50 mg  Status:  Discontinued        50 mg Oral Daily 11/13/19 1521 12/12/19 1413   11/13/19 1615  emtricitabine-tenofovir AF (DESCOVY) 200-25 MG per tablet 1 tablet  Status:  Discontinued        1 tablet Per Tube Daily 11/13/19 1521 12/12/19 1413   11/13/19 1530  sulfamethoxazole-trimethoprim (BACTRIM DS) 800-160 MG per tablet 1 tablet  Status:  Discontinued        1 tablet Oral Once per day on Mon Wed Fri 11/13/19 1441 11/13/19 1906   11/13/19 1500  bictegravir-emtricitabine-tenofovir AF (BIKTARVY) 50-200-25 MG per tablet 1 tablet  Status:   Discontinued        1 tablet Oral Daily 11/13/19 1403 11/13/19 1521   11/10/19 1000  erythromycin 250 mg in sodium chloride 0.9 % 100 mL IVPB        250 mg 100 mL/hr over 60 Minutes Intravenous Every 8 hours 11/10/19 0907 11/11/19 2000   11/07/19 1200  ceFEPIme (MAXIPIME) 2 g in sodium chloride 0.9 % 100 mL IVPB        2 g 200 mL/hr over 30 Minutes Intravenous Every 8 hours 11/07/19 1013 11/13/19 2127   11/03/19 1200  cefTRIAXone (ROCEPHIN) 2 g in sodium chloride 0.9 % 100 mL IVPB        2 g 200 mL/hr over 30 Minutes Intravenous Every 24 hours 11/03/19 0922 11/05/19 1427   11/02/19 0800  vancomycin (VANCOREADY) IVPB 750 mg/150 mL  Status:  Discontinued        750 mg 150 mL/hr over 60 Minutes Intravenous Every 12 hours 11/01/19 1935 11/02/19 1105   11/02/19 0600  piperacillin-tazobactam (ZOSYN) IVPB 3.375 g  Status:  Discontinued        3.375 g 12.5 mL/hr over 240 Minutes Intravenous Every 8 hours 11/02/19 0311 11/03/19 0920   11/01/19 2200  ceFEPIme (MAXIPIME) 2 g in sodium chloride 0.9 % 100 mL IVPB  Status:  Discontinued        2 g 200 mL/hr over 30 Minutes Intravenous Every 12 hours 11/01/19 2143 11/02/19 0311   11/01/19 1915  vancomycin (VANCOREADY) IVPB 1500 mg/300 mL        1,500 mg 150 mL/hr over 120 Minutes Intravenous  Once 11/01/19 1909 11/01/19 2147   11/01/19 1845  cefTRIAXone (ROCEPHIN) 1 g in sodium chloride 0.9 % 100 mL IVPB        1 g 200 mL/hr over 30 Minutes Intravenous  Once 11/01/19 1842 11/01/19 2051   11/01/19 1845  azithromycin (ZITHROMAX) 500 mg in sodium chloride 0.9 % 250 mL IVPB        500 mg 250 mL/hr over 60 Minutes Intravenous  Once 11/01/19 1842 11/01/19 2051       Time spent: 22 minutes  Darliss Cheney MD  [facility]  If 7PM-7AM, please contact night-coverage at [email or web address] 12/16/2019, 11:18 AM  LOS: 45 days

## 2019-12-16 NOTE — Plan of Care (Signed)
  Problem: Education: Goal: Knowledge of General Education information will improve Description: Including pain rating scale, medication(s)/side effects and non-pharmacologic comfort measures Outcome: Not Progressing   Problem: Health Behavior/Discharge Planning: Goal: Ability to manage health-related needs will improve Outcome: Not Progressing   Problem: Clinical Measurements: Goal: Ability to maintain clinical measurements within normal limits will improve Outcome: Not Progressing Goal: Will remain free from infection Outcome: Not Progressing Goal: Diagnostic test results will improve Outcome: Not Progressing Goal: Respiratory complications will improve Outcome: Not Progressing Goal: Cardiovascular complication will be avoided Outcome: Not Progressing   Problem: Activity: Goal: Risk for activity intolerance will decrease Outcome: Not Progressing   Problem: Nutrition: Goal: Adequate nutrition will be maintained Outcome: Not Progressing   Problem: Coping: Goal: Level of anxiety will decrease Outcome: Not Progressing   Problem: Elimination: Goal: Will not experience complications related to bowel motility Outcome: Not Progressing Goal: Will not experience complications related to urinary retention Outcome: Not Progressing   Problem: Pain Managment: Goal: General experience of comfort will improve Outcome: Not Progressing   Problem: Safety: Goal: Ability to remain free from injury will improve Outcome: Not Progressing   Problem: Skin Integrity: Goal: Risk for impaired skin integrity will decrease Outcome: Not Progressing   Problem: Education: Goal: Expressions of having a comfortable level of knowledge regarding the disease process will increase Outcome: Not Progressing   Problem: Coping: Goal: Ability to adjust to condition or change in health will improve Outcome: Not Progressing Goal: Ability to identify appropriate support needs will improve Outcome: Not  Progressing   Problem: Health Behavior/Discharge Planning: Goal: Compliance with prescribed medication regimen will improve Outcome: Not Progressing   Problem: Medication: Goal: Risk for medication side effects will decrease Outcome: Not Progressing   Problem: Clinical Measurements: Goal: Complications related to the disease process, condition or treatment will be avoided or minimized Outcome: Not Progressing Goal: Diagnostic test results will improve Outcome: Not Progressing   Problem: Safety: Goal: Verbalization of understanding the information provided will improve Outcome: Not Progressing   Problem: Self-Concept: Goal: Level of anxiety will decrease Outcome: Not Progressing Goal: Ability to verbalize feelings about condition will improve Outcome: Not Progressing   

## 2019-12-16 NOTE — Plan of Care (Signed)
  Problem: Education: Goal: Knowledge of General Education information will improve Description: Including pain rating scale, medication(s)/side effects and non-pharmacologic comfort measures Outcome: Completed/Met   Problem: Health Behavior/Discharge Planning: Goal: Ability to manage health-related needs will improve Outcome: Completed/Met   Problem: Clinical Measurements: Goal: Ability to maintain clinical measurements within normal limits will improve Outcome: Completed/Met Goal: Will remain free from infection Outcome: Completed/Met Goal: Diagnostic test results will improve Outcome: Completed/Met Goal: Respiratory complications will improve Outcome: Completed/Met Goal: Cardiovascular complication will be avoided Outcome: Completed/Met   Problem: Activity: Goal: Risk for activity intolerance will decrease Outcome: Completed/Met   Problem: Nutrition: Goal: Adequate nutrition will be maintained Outcome: Completed/Met   Problem: Coping: Goal: Level of anxiety will decrease Outcome: Completed/Met   Problem: Elimination: Goal: Will not experience complications related to bowel motility Outcome: Completed/Met Goal: Will not experience complications related to urinary retention Outcome: Completed/Met   Problem: Pain Managment: Goal: General experience of comfort will improve Outcome: Completed/Met   Problem: Safety: Goal: Ability to remain free from injury will improve Outcome: Completed/Met   Problem: Skin Integrity: Goal: Risk for impaired skin integrity will decrease Outcome: Completed/Met   Problem: Education: Goal: Expressions of having a comfortable level of knowledge regarding the disease process will increase Outcome: Completed/Met   Problem: Coping: Goal: Ability to adjust to condition or change in health will improve Outcome: Completed/Met Goal: Ability to identify appropriate support needs will improve Outcome: Completed/Met   Problem: Health  Behavior/Discharge Planning: Goal: Compliance with prescribed medication regimen will improve Outcome: Completed/Met   Problem: Medication: Goal: Risk for medication side effects will decrease Outcome: Completed/Met   Problem: Clinical Measurements: Goal: Complications related to the disease process, condition or treatment will be avoided or minimized Outcome: Completed/Met Goal: Diagnostic test results will improve Outcome: Completed/Met   Problem: Safety: Goal: Verbalization of understanding the information provided will improve Outcome: Completed/Met   Problem: Self-Concept: Goal: Level of anxiety will decrease Outcome: Completed/Met Goal: Ability to verbalize feelings about condition will improve Outcome: Completed/Met   

## 2019-12-17 LAB — GLUCOSE, CAPILLARY
Glucose-Capillary: 100 mg/dL — ABNORMAL HIGH (ref 70–99)
Glucose-Capillary: 97 mg/dL (ref 70–99)
Glucose-Capillary: 111 mg/dL — ABNORMAL HIGH (ref 70–99)
Glucose-Capillary: 93 mg/dL (ref 70–99)
Glucose-Capillary: 121 mg/dL — ABNORMAL HIGH (ref 70–99)

## 2019-12-17 NOTE — Plan of Care (Signed)
  Problem: Education: Goal: Knowledge of General Education information will improve Description: Including pain rating scale, medication(s)/side effects and non-pharmacologic comfort measures Outcome: Not Progressing   

## 2019-12-17 NOTE — Progress Notes (Signed)
[facility] PROGRESS NOTE  Ryan Orozco CHE:[id number] DOB: Oct 06, 1965 DOA: 11/01/2019 PCP: Default, Provider, MD  Status: Remains inpatient appropriate because:Altered mental status, Unsafe d/c plan and Inpatient level of care appropriate due to severity of illness. Patient newly diagnosed with anoxic brain injury. Still has excessive tracheal secretions patient is managing primarily by strong cough to expectorate.  Dispo: The patient is from: Home              Anticipated d/c is to: SNF              Anticipated d/c date is: > 3 days              Patient currently is not medically stable to d/c. Patient currently requiring frequent suctioning therefore at this time is not a candidate for discharge. PMV training in process. Medicaid application in process. Patient will also need to have disability paperwork completed since do not expect full recovery.  Tube has been in place greater than 30 days.   Code Status: Full Family Communication: No family at bedside DVT prophylaxis: Subcutaneous heparin Vaccination status: Does not appear to have received Covid vaccine but will need to confirm with family  Foley catheter: No  HPI: 54 year old male with HIV, chronic alcohol abuse who was presented to the emergency department after being found unresponsive outside a convinient store.  EMS found him pulseless in PEA, unknown downtime, successfully resuscitated and brought to the emergency department.  UDS positive for THC/benzodiazepines.  CT head unremarkable.  Failed extubation trials due to severe anoxic brain injury and had tracheostomy placed.  PEG placed on 10/15.  Now the plan is for placement in a skilled nursing facility/LTAC.  Prolonged hospital course.  Difficult placement. TOC following  Significant Hospital Events   9/29 Admit post PEA arrest. UDS positive for THC, benzo's. ETOH 177 10/05 EEG ongoing. Versed restarted overnight due to agitation. Nicardipine increased.  10/06  Developed tachypnea with WUA, no follow commands off sedation  10/07 Vomiting, TF held / restarted with recurrent vomiting 10/16 tracheostomy collar for 9 hours 10/18->10/19 off vent all night  10/20> TC  Subjective: Seen and examined.  Patient alert but not oriented.  Little fidgety.  Trying to move all extremities.  Has contractures in right lower extremity.  High tone in all extremities.  Objective: Vitals:   12/17/19 0801 12/17/19 1129  BP:    Pulse: 100 95  Resp: 18 20  Temp:    SpO2: 96% 98%    Intake/Output Summary (Last 24 hours) at 12/17/2019 1148 Last data filed at 12/16/2019 1930 Gross per 24 hour  Intake --  Output 600 ml  Net -600 ml   Filed Weights   12/13/19 2257 12/14/19 0114 12/15/19 0500  Weight: 73.5 kg 73.9 kg 73.7 kg    Exam:  General exam: Appears calm and comfortable with intermittent fidgety. Respiratory system: Clear to auscultation. Respiratory effort normal. Cardiovascular system: S1 & S2 heard, RRR. No JVD, murmurs, rubs, gallops or clicks. No pedal edema. Gastrointestinal system: Abdomen is nondistended, soft and nontender. No organomegaly or masses felt. Normal bowel sounds heard. Central nervous system: Alert but not oriented.  Contractures in right lower extremity and generalized hypertonicity.  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   -Continue Keppra-check  levetiracetam level which is pending. -Continue seizure precautions; no further seizure activity since transitioned out of ICU setting  Dysphagia 2/2 anoxic brain injury -Once tolerating PMV can assess swallowing more thoroughly -Continue tube feeding per PEG.  Asked RN to reach out to SLP to assess patient now that he is more awake.  Goals of care:  -Palliative care was involved during this hospitalization.   -Goals of care were discussed. remains full code.  - Palliative care recommended outpatient follow-up with palliative providers.  Protein calorie malnutrition nutrition Status: Nutrition Problem: Increased nutrient needs Etiology: acute illness Signs/Symptoms: estimated needs Interventions: Tube feeding Estimated body mass index is 26.22 kg/m as calculated from the following:   Height as of this encounter: 5' 6"  (1.676 m).   Weight as of this encounter: 73.7 kg.   Multiple decubiti not POA Pressure Injury 11/13/19 Anus Stage 3 -  Full thickness tissue loss. Subcutaneous fat may be visible but bone, tendon or muscle are NOT exposed. (Active)  Date First Assessed/Time First Assessed: 11/13/19 1000   Location: Anus  Staging: Stage 3 -  Full thickness tissue loss. Subcutaneous fat may be visible but bone, tendon or muscle are NOT exposed.  Present on Admission: No    Assessments 11/17/2019  8:00 AM 12/15/2019  8:20 PM  Dressing Type None --  Site / Wound Assessment Pink --  Peri-wound Assessment Intact --  Drainage Amount None None     No Linked orders to display     Wound / Incision (Open or Dehisced) 11/17/19 Puncture Abdomen Left;Anterior;Upper G-tube insertion site  (Active)  Date First Assessed/Time First Assessed: 11/17/19 1646   Wound Type: Puncture  Location: Abdomen  Location Orientation: Left;Anterior;Upper  Wound Description (Comments): G-tube insertion site   Present on Admission: No    Assessments 11/17/2019  4:47 PM 12/15/2019  8:31 AM  Dressing Type Gauze  (Comment);Tape dressing None  Dressing Changed New --  Dressing Status Clean;Dry;Intact --  Dressing Change Frequency PRN --  Site / Wound Assessment Clean;Dry;Pink --  Peri-wound Assessment Intact --  Closure None --  Drainage Amount None --  Treatment Cleansed --     No Linked orders to display     Pressure Injury 12/01/19 Ankle Left;Lateral Stage 2 -  Partial thickness loss of dermis presenting as a shallow open injury with a red, pink wound bed without slough. (Active)  Date First Assessed/Time First Assessed: 12/01/19 2002   Location: Ankle  Location Orientation: Left;Lateral  Staging: Stage 2 -  Partial thickness loss of dermis presenting as a shallow open injury with a red, pink wound bed without slough.    Assessments 12/01/2019  7:52 PM 12/13/2019 10:00 AM  Dressing Type Foam - Lift dressing to assess site every shift --  Dressing Clean;Dry;Intact --  Site / Wound Assessment Pink --  Peri-wound Assessment Intact --  Wound Length (cm) -- 2.5 cm  Wound Width (cm) -- 0.5 cm  Wound Depth (cm) -- 0 cm  Wound Surface Area (cm^2) -- 1.25 cm^2  Wound Volume (cm^3) -- 0 cm^3  Margins Unattached edges (unapproximated) --  Drainage Amount Scant --  Drainage Description Serosanguineous --  Treatment Cleansed --     No Linked orders to display  Other problems: Hypertension/grade 1 diastolic dysfunction:  -Blood pressure stable on amlodipine  -Echocardiogram this admission with preserved LVEF with evidence of grade 1 diastolic dysfunction. No physiology currently consistent with heart failure  HIV:  -CD4 count of 124.  -Dc'd Tivicay and Descovy infavor of Biktarvy to for eventual dc to SNF -Continue prophylactic Bactrim  Inguinal hernias -Evaluated by general surgery this admission. Documented as chronically incarcerated. Patient has been clinically stable and tolerating tube feedings and having bowel movements so unless patient obstructed would not be considered a  candidate for repair. Even if obstructed consulting surgeon was not sure he would be a candidate for hernia repair regardless .   Data Reviewed: Basic Metabolic Panel: No results for input(s): NA, K, CL, CO2, GLUCOSE, BUN, CREATININE, CALCIUM, MG, PHOS in the last 168 hours. Liver Function Tests: No results for input(s): AST, ALT, ALKPHOS, BILITOT, PROT, ALBUMIN in the last 168 hours. No results for input(s): LIPASE, AMYLASE in the last 168 hours. No results for input(s): AMMONIA in the last 168 hours. CBC: No results for input(s): WBC, NEUTROABS, HGB, HCT, MCV, PLT in the last 168 hours. Cardiac Enzymes: No results for input(s): CKTOTAL, CKMB, CKMBINDEX, TROPONINI in the last 168 hours. BNP (last 3 results) No results for input(s): BNP in the last 8760 hours.  ProBNP (last 3 results) No results for input(s): PROBNP in the last 8760 hours.  CBG: Recent Labs  Lab 12/16/19 1649 12/16/19 1951 12/16/19 2356 12/17/19 0357 12/17/19 1141  GLUCAP 117* 104* 136* 100* 97    No results found for this or any previous visit (from the past 240 hour(s)).   Studies: No results found.  Scheduled Meds: . amLODipine  10 mg Per Tube Daily  . bictegravir-emtricitabine-tenofovir AF  1 tablet Oral Daily  . chlorhexidine gluconate (MEDLINE KIT)  15 mL Mouth Rinse BID  . Chlorhexidine Gluconate Cloth  6 each Topical Q0600  . feeding supplement (PROSource TF)  45 mL Per Tube TID  . folic acid  1 mg Per Tube Daily  . free water  200 mL Per Tube Q4H  . heparin injection (subcutaneous)  5,000 Units Subcutaneous Q8H  . levETIRAcetam  1,500 mg Per Tube BID  . mouth rinse  15 mL Mouth Rinse 10 times per day  . nutrition supplement (JUVEN)  1 packet Oral BID BM  . nystatin   Topical TID  . scopolamine  1 patch Transdermal Q72H  . sulfamethoxazole-trimethoprim  1 tablet Per Tube Once per day on Mon Wed Fri  . thiamine  100 mg Per Tube Daily   Continuous Infusions: . feeding supplement (OSMOLITE  1.5 CAL) 1,000 mL (12/16/19 2213)    Active Problems:   Cardiac arrest [facility])   Community acquired pneumonia of left upper lobe of lung   Anoxic brain injury ([place])   Alcohol abuse   Acute respiratory failure with hypoxia ([facility])   Vomiting   Pressure injury of skin   Small bowel obstruction ([facility])   Palliative care encounter   Bowel obstruction ([facility])   Chronic respiratory failure ([facility])   Diastolic dysfunction   Tracheostomy dependent ([place])   Dysphagia   Protein-calorie malnutrition ([place])   Seizures ([place])   Bilateral recurrent inguinal hernia   Consultants:  PCCM  Palliative medicine  General surgery  Neurology  Interventional radiology  Procedures:  EEG  Echocardiogram  Tracheostomy  Antibiotics: Anti-infectives (From admission, onward)   Start     Dose/Rate Route Frequency Ordered Stop   12/13/19 1000  bictegravir-emtricitabine-tenofovir AF (BIKTARVY) 50-200-25 MG per tablet 1 tablet        1 tablet Oral Daily 12/12/19 1413     11/17/19 1548  ceFAZolin (ANCEF) 2-4 GM/100ML-% IVPB       Note to Pharmacy: Domenick Bookbinder   : cabinet override      11/17/19 1548 11/18/19 0359   11/16/19 1515  ceFAZolin (ANCEF) IVPB 2g/100 mL premix        2 g 200 mL/hr over 30 Minutes Intravenous To Radiology 11/16/19 1511 11/17/19 1625   11/15/19 0900  sulfamethoxazole-trimethoprim (BACTRIM DS) 800-160 MG per tablet 1 tablet        1 tablet Per Tube Once per day on Mon Wed Fri 11/13/19 1906     11/13/19 1615  dolutegravir (TIVICAY) tablet 50 mg  Status:  Discontinued        50 mg Oral Daily 11/13/19 1521 12/12/19 1413   11/13/19 1615  emtricitabine-tenofovir AF (DESCOVY) 200-25 MG per tablet 1 tablet  Status:  Discontinued        1 tablet Per Tube Daily 11/13/19 1521 12/12/19 1413   11/13/19 1530  sulfamethoxazole-trimethoprim (BACTRIM DS) 800-160 MG per tablet 1 tablet  Status:  Discontinued        1 tablet Oral Once per day on Mon Wed Fri 11/13/19 1441 11/13/19 1906    11/13/19 1500  bictegravir-emtricitabine-tenofovir AF (BIKTARVY) 50-200-25 MG per tablet 1 tablet  Status:  Discontinued        1 tablet Oral Daily 11/13/19 1403 11/13/19 1521   11/10/19 1000  erythromycin 250 mg in sodium chloride 0.9 % 100 mL IVPB        250 mg 100 mL/hr over 60 Minutes Intravenous Every 8 hours 11/10/19 0907 11/11/19 2000   11/07/19 1200  ceFEPIme (MAXIPIME) 2 g in sodium chloride 0.9 % 100 mL IVPB        2 g 200 mL/hr over 30 Minutes Intravenous Every 8 hours 11/07/19 1013 11/13/19 2127   11/03/19 1200  cefTRIAXone (ROCEPHIN) 2 g in sodium chloride 0.9 % 100 mL IVPB        2 g 200 mL/hr over 30 Minutes Intravenous Every 24 hours 11/03/19 0922 11/05/19 1427   11/02/19 0800  vancomycin (VANCOREADY) IVPB 750 mg/150 mL  Status:  Discontinued        750 mg 150 mL/hr over 60 Minutes Intravenous Every 12 hours 11/01/19 1935 11/02/19 1105   11/02/19 0600  piperacillin-tazobactam (ZOSYN) IVPB 3.375 g  Status:  Discontinued        3.375 g 12.5 mL/hr over 240 Minutes Intravenous Every 8 hours 11/02/19 0311 11/03/19 0920   11/01/19 2200  ceFEPIme (MAXIPIME) 2 g in sodium chloride 0.9 % 100 mL IVPB  Status:  Discontinued        2 g 200 mL/hr over 30 Minutes Intravenous Every 12 hours 11/01/19 2143 11/02/19 0311   11/01/19 1915  vancomycin (VANCOREADY) IVPB 1500 mg/300 mL        1,500 mg 150 mL/hr over 120 Minutes Intravenous  Once 11/01/19 1909 11/01/19 2147   11/01/19 1845  cefTRIAXone (ROCEPHIN) 1 g in sodium chloride 0.9 % 100 mL IVPB        1 g 200 mL/hr over 30 Minutes Intravenous  Once 11/01/19 1842 11/01/19 2051   11/01/19 1845  azithromycin (ZITHROMAX) 500 mg in sodium chloride 0.9 % 250 mL IVPB        500 mg 250 mL/hr over 60 Minutes Intravenous  Once 11/01/19  1842 11/01/19 2051      Time spent: 21 minutes  Darliss Cheney MD  [facility]  If 7PM-7AM, please contact night-coverage at [email or web address] 12/17/2019, 11:48 AM  LOS: 46 days

## 2019-12-17 NOTE — Plan of Care (Signed)
  Problem: Education: Goal: Knowledge of General Education information will improve Description: Including pain rating scale, medication(s)/side effects and non-pharmacologic comfort measures Outcome: Not Applicable   Problem: Clinical Measurements: Goal: Will remain free from infection Outcome: Not Applicable

## 2019-12-18 LAB — GLUCOSE, CAPILLARY
Glucose-Capillary: 117 mg/dL — ABNORMAL HIGH (ref 70–99)
Glucose-Capillary: 105 mg/dL — ABNORMAL HIGH (ref 70–99)
Glucose-Capillary: 133 mg/dL — ABNORMAL HIGH (ref 70–99)
Glucose-Capillary: 109 mg/dL — ABNORMAL HIGH (ref 70–99)
Glucose-Capillary: 129 mg/dL — ABNORMAL HIGH (ref 70–99)
Glucose-Capillary: 105 mg/dL — ABNORMAL HIGH (ref 70–99)

## 2019-12-18 MED ORDER — BACLOFEN 10 MG PO TABS
5.0000 mg | ORAL_TABLET | Freq: Three times a day (TID) | ORAL | Status: DC
  Administered 2019-12-18 (×3): 5 mg via ORAL
  Filled 2019-12-18 (×3): qty 1

## 2019-12-18 NOTE — Progress Notes (Signed)
Pt refused suction.  Nurse aware.

## 2019-12-18 NOTE — Progress Notes (Addendum)
[facility] PROGRESS NOTE  Ryan Orozco:[id number] DOB: [birth year] DOA: 11/01/2019 PCP: Default, Provider, MD  Status: Remains inpatient appropriate because:Altered mental status, Unsafe d/c plan and Inpatient level of care appropriate due to severity of illness. Patient newly diagnosed with anoxic brain injury. Still has excessive tracheal secretions patient is managing primarily by strong cough to expectorate.  Dispo: The patient is from: Home              Anticipated d/c is to: SNF              Anticipated d/c date is: > 3 days              Patient currently is not medically stable to d/c. Patient currently requiring frequent suctioning therefore at this time is not a candidate for discharge. PMV training in process. Medicaid application in process. Patient will also need to have disability paperwork completed since do not expect full recovery.  Tube has been in place greater than 30 days.   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  EMS found him pulseless in PEA, unknown downtime, successfully resuscitated and brought to the emergency department.  UDS positive for THC/benzodiazepines.  CT head unremarkable.  Failed extubation trials due to severe anoxic brain injury and had tracheostomy placed.  PEG placed on 10/15.  Now the plan is for placement in a skilled nursing facility/LTAC.  Prolonged hospital course.  Difficult placement. TOC following  Significant Hospital Events   9/29 Admit post PEA arrest. UDS positive for THC, benzo's. ETOH 177 10/05 EEG ongoing. Versed restarted overnight due to agitation. Nicardipine  increased.  10/06 Developed tachypnea with WUA, no follow commands off sedation  10/07 Vomiting, TF held / restarted with recurrent vomiting 10/16 tracheostomy collar for 9 hours 10/18->10/19 off vent all night  10/20> TC  Subjective: Alert attempting to phonate but no meaningful words noted.  Unable to follow simple commands at this time but does make appropriate eye contact and laughs appropriately.  Very fidgety and noted with dystonic movements of both upper and lower extremities.  Objective: Vitals:   12/18/19 0509 12/18/19 0730  BP: 114/68   Pulse: 82 72  Resp: 18 18  Temp: 98.5 F (36.9 C)   SpO2: 96% 97%    Intake/Output Summary (Last 24 hours) at 12/18/2019 1019 Last data filed at 12/17/2019 2100 Gross per 24 hour  Intake 60 ml  Output 750 ml  Net -690 ml   Filed Weights   12/15/19 0500 12/18/19 0029 12/18/19 0113  Weight: 73.7 kg 75.9 kg 75.9 kg    Exam: Constitutional: NAD, calm, quite alert and fidgety, smiles Respiratory: Clear to auscultation bilaterally, #6 cuffless trach with 21% FiO2/TC; no tracheal secretions noted Cardiovascular: Regular rate and rhythm, no murmurs, no extremity edema.  Adequate capillary refill Abdomen: no tenderness with palpation, bowel sounds positive.  Tolerating current tube feeding via PEG tube diarrhea. Musculoskeletal: No joint deformity upper and lower extremities.  May have some lower extremity contractures but exam limited by patient's significant upper and lower extremity dystonic movements. Skin: Several decubiti as described below Neurologic: CN 2-12 grossly intact definitive bilateral disconjugate gaze today,. Sensation appears to be intact both  upper and lower extremities, difficult to test strength in upper extremities but appears to be 2/5 with lower extremities 2-3/5 Psychiatric: Awake and, appears to be oriented to name.  Bright affect   Assessment/Plan: Acute problems: Anoxic brain injury 2/2 PEA arrest:   -Presumed 2/2 combination alcohol and BZD overdose with UDS positive for THC and BZDs -Anticipate will not recover fully from this event or recover to previous baseline therefore long-term SNF placement expected -Continue PT/OT/SLP -For ongoing dystonic movements of extremities we will add low-dose baclofen 3 times daily and titrate as needed  Acute hypoxemic respiratory failure/new tracheostomy tube requirement -Decreased frequency of secretions and suctioning-able to cough up majority of secretions -Continue PMV training per SLP -Trach team following peripherally plans to downsize to #4 cuffless trach 11/15 to be followed by capping trial -Tracheostomy tube placed on 10/3   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   -Continue Keppra-check levetiracetam level -no further seizure activity since transitioned out of ICU setting  Dysphagia 2/2 anoxic brain injury -Once tolerating PMV can assess swallowing more thoroughly -Continue tube feeding per PEG  Goals of care:  -Palliative care was involved during this hospitalization.   -Goals of care were discussed. remains full code.  - Palliative care recommended outpatient follow-up with palliative providers.  Protein calorie malnutrition nutrition Status: Nutrition Problem: Increased nutrient needs Etiology: acute illness Signs/Symptoms: estimated needs Interventions: Tube feeding Estimated body mass index is 27 kg/m as calculated from the following:   Height as of this encounter: 5' 6"  (1.676 m).   Weight as of this encounter: 75.9 kg.   Multiple decubiti not POA Pressure Injury 11/13/19 Anus Stage 3 -  Full thickness tissue loss. Subcutaneous fat may be visible but bone, tendon or muscle are NOT exposed. (Active)  Date First Assessed/Time First Assessed: 11/13/19 1000   Location: Anus  Staging: Stage 3 -  Full thickness tissue loss. Subcutaneous fat may be visible but bone, tendon or muscle are NOT exposed.  Present  on Admission: No    Assessments 11/17/2019  8:00 AM 12/15/2019  8:20 PM  Dressing Type None --  Site / Wound Assessment Pink --  Peri-wound Assessment Intact --  Drainage Amount None None     No Linked orders to display     Wound / Incision (Open or Dehisced) 11/17/19 Puncture Abdomen Left;Anterior;Upper G-tube insertion site  (Active)  Date First Assessed/Time First Assessed: 11/17/19 1646   Wound Type: Puncture  Location: Abdomen  Location Orientation: Left;Anterior;Upper  Wound Description (Comments): G-tube insertion site   Present on Admission: No    Assessments 11/17/2019  4:47 PM 12/15/2019  8:31 AM  Dressing Type Gauze (Comment);Tape dressing None  Dressing Changed New --  Dressing Status Clean;Dry;Intact --  Dressing Change Frequency PRN --  Site / Wound Assessment Clean;Dry;Pink --  Peri-wound Assessment Intact --  Closure None --  Drainage Amount None --  Treatment Cleansed --     No Linked orders to display     Pressure Injury 12/01/19 Ankle Left;Lateral Stage 2 -  Partial thickness loss of dermis presenting as a shallow open injury with a red, pink wound bed without slough. (Active)  Date First Assessed/Time First Assessed: 12/01/19 2002   Location: Ankle  Location Orientation: Left;Lateral  Staging: Stage 2 -  Partial thickness loss of dermis presenting as a shallow open injury with a red, pink wound bed without slough.    Assessments 12/01/2019  7:52 PM 12/13/2019 10:00 AM  Dressing Type Foam -  Lift dressing to assess site every shift --  Dressing Clean;Dry;Intact --  Site / Wound Assessment Pink --  Peri-wound Assessment Intact --  Wound Length (cm) -- 2.5 cm  Wound Width (cm) -- 0.5 cm  Wound Depth (cm) -- 0 cm  Wound Surface Area (cm^2) -- 1.25 cm^2  Wound Volume (cm^3) -- 0 cm^3  Margins Unattached edges (unapproximated) --  Drainage Amount Scant --  Drainage Description Serosanguineous --  Treatment Cleansed --     No Linked orders to display       Other problems: Hypertension/grade 1 diastolic dysfunction:  -Blood pressure stable on amlodipine  -Echocardiogram this admission with preserved LVEF with evidence of grade 1 diastolic dysfunction. No physiology currently consistent with heart failure  HIV:  -CD4 count of 124.  -Dc'd Tivicay and Descovy infavor of Biktarvy to for eventual dc to SNF -Continue prophylactic Bactrim  Inguinal hernias -Evaluated by general surgery this admission. Documented as chronically incarcerated. Patient has been clinically stable and tolerating tube feedings and having bowel movements so unless patient obstructed would not be considered a candidate for repair. Even if obstructed consulting surgeon was not sure he would be a candidate for hernia repair regardless .   Data Reviewed: Basic Metabolic Panel: No results for input(s): NA, K, CL, CO2, GLUCOSE, BUN, CREATININE, CALCIUM, MG, PHOS in the last 168 hours. Liver Function Tests: No results for input(s): AST, ALT, ALKPHOS, BILITOT, PROT, ALBUMIN in the last 168 hours. No results for input(s): LIPASE, AMYLASE in the last 168 hours. No results for input(s): AMMONIA in the last 168 hours. CBC: No results for input(s): WBC, NEUTROABS, HGB, HCT, MCV, PLT in the last 168 hours. Cardiac Enzymes: No results for input(s): CKTOTAL, CKMB, CKMBINDEX, TROPONINI in the last 168 hours. BNP (last 3 results) No results for input(s): BNP in the last 8760 hours.  ProBNP (last 3 results) No results for input(s): PROBNP in the last 8760 hours.  CBG: Recent Labs  Lab 12/17/19 1603 12/17/19 2004 12/17/19 2303 12/18/19 0344 12/18/19 0903  GLUCAP 111* 93 121* 105* 133*    No results found for this or any previous visit (from the past 240 hour(s)).   Studies: No results found.  Scheduled Meds: . amLODipine  10 mg Per Tube Daily  . baclofen  5 mg Oral TID  . bictegravir-emtricitabine-tenofovir AF  1 tablet Oral Daily  . chlorhexidine  gluconate (MEDLINE KIT)  15 mL Mouth Rinse BID  . Chlorhexidine Gluconate Cloth  6 each Topical Q0600  . feeding supplement (PROSource TF)  45 mL Per Tube TID  . folic acid  1 mg Per Tube Daily  . free water  200 mL Per Tube Q4H  . heparin injection (subcutaneous)  5,000 Units Subcutaneous Q8H  . levETIRAcetam  1,500 mg Per Tube BID  . mouth rinse  15 mL Mouth Rinse 10 times per day  . nutrition supplement (JUVEN)  1 packet Oral BID BM  . nystatin   Topical TID  . scopolamine  1 patch Transdermal Q72H  . sulfamethoxazole-trimethoprim  1 tablet Per Tube Once per day on Mon Wed Fri  . thiamine  100 mg Per Tube Daily   Continuous Infusions: . feeding supplement (OSMOLITE 1.5 CAL) 1,000 mL (12/16/19 2213)    Active Problems:   Cardiac arrest [facility])   Community acquired pneumonia of left upper lobe of lung   Anoxic brain injury ([place])   Alcohol abuse   Acute respiratory failure with hypoxia ([facility])   Vomiting  Pressure injury of skin   Small bowel obstruction ([place])   Palliative care encounter   Bowel obstruction ([facility])   Chronic respiratory failure ([facility])   Diastolic dysfunction   Tracheostomy dependent ([place])   Dysphagia   Protein-calorie malnutrition ([place])   Seizures ([place])   Bilateral recurrent inguinal hernia   Consultants:  PCCM  Palliative medicine  General surgery  Neurology  Interventional radiology  Procedures:  EEG  Echocardiogram  Tracheostomy  Antibiotics: Anti-infectives (From admission, onward)   Start     Dose/Rate Route Frequency Ordered Stop   12/13/19 1000  bictegravir-emtricitabine-tenofovir AF (BIKTARVY) 50-200-25 MG per tablet 1 tablet        1 tablet Oral Daily 12/12/19 1413     11/17/19 1548  ceFAZolin (ANCEF) 2-4 GM/100ML-% IVPB       Note to Pharmacy: Domenick Bookbinder   : cabinet override      11/17/19 1548 11/18/19 0359   11/16/19 1515  ceFAZolin (ANCEF) IVPB 2g/100 mL premix        2 g 200 mL/hr over 30 Minutes Intravenous To Radiology  11/16/19 1511 11/17/19 1625   11/15/19 0900  sulfamethoxazole-trimethoprim (BACTRIM DS) 800-160 MG per tablet 1 tablet        1 tablet Per Tube Once per day on Mon Wed Fri 11/13/19 1906     11/13/19 1615  dolutegravir (TIVICAY) tablet 50 mg  Status:  Discontinued        50 mg Oral Daily 11/13/19 1521 12/12/19 1413   11/13/19 1615  emtricitabine-tenofovir AF (DESCOVY) 200-25 MG per tablet 1 tablet  Status:  Discontinued        1 tablet Per Tube Daily 11/13/19 1521 12/12/19 1413   11/13/19 1530  sulfamethoxazole-trimethoprim (BACTRIM DS) 800-160 MG per tablet 1 tablet  Status:  Discontinued        1 tablet Oral Once per day on Mon Wed Fri 11/13/19 1441 11/13/19 1906   11/13/19 1500  bictegravir-emtricitabine-tenofovir AF (BIKTARVY) 50-200-25 MG per tablet 1 tablet  Status:  Discontinued        1 tablet Oral Daily 11/13/19 1403 11/13/19 1521   11/10/19 1000  erythromycin 250 mg in sodium chloride 0.9 % 100 mL IVPB        250 mg 100 mL/hr over 60 Minutes Intravenous Every 8 hours 11/10/19 0907 11/11/19 2000   11/07/19 1200  ceFEPIme (MAXIPIME) 2 g in sodium chloride 0.9 % 100 mL IVPB        2 g 200 mL/hr over 30 Minutes Intravenous Every 8 hours 11/07/19 1013 11/13/19 2127   11/03/19 1200  cefTRIAXone (ROCEPHIN) 2 g in sodium chloride 0.9 % 100 mL IVPB        2 g 200 mL/hr over 30 Minutes Intravenous Every 24 hours 11/03/19 0922 11/05/19 1427   11/02/19 0800  vancomycin (VANCOREADY) IVPB 750 mg/150 mL  Status:  Discontinued        750 mg 150 mL/hr over 60 Minutes Intravenous Every 12 hours 11/01/19 1935 11/02/19 1105   11/02/19 0600  piperacillin-tazobactam (ZOSYN) IVPB 3.375 g  Status:  Discontinued        3.375 g 12.5 mL/hr over 240 Minutes Intravenous Every 8 hours 11/02/19 0311 11/03/19 0920   11/01/19 2200  ceFEPIme (MAXIPIME) 2 g in sodium chloride 0.9 % 100 mL IVPB  Status:  Discontinued        2 g 200 mL/hr over 30 Minutes Intravenous Every 12 hours 11/01/19 2143 11/02/19 0311    11/01/19 1915  vancomycin (VANCOREADY) IVPB 1500 mg/300 mL        1,500 mg 150 mL/hr over 120 Minutes Intravenous  Once 11/01/19 1909 11/01/19 2147   11/01/19 1845  cefTRIAXone (ROCEPHIN) 1 g in sodium chloride 0.9 % 100 mL IVPB        1 g 200 mL/hr over 30 Minutes Intravenous  Once 11/01/19 1842 11/01/19 2051   11/01/19 1845  azithromycin (ZITHROMAX) 500 mg in sodium chloride 0.9 % 250 mL IVPB        500 mg 250 mL/hr over 60 Minutes Intravenous  Once 11/01/19 1842 11/01/19 2051       Time spent: 25 minutes    Erin Hearing ANP  [facility] Pager [phone]. If 7PM-7AM, please contact night-coverage at [email or web address] 12/18/2019, 10:19 AM  LOS: 47 days

## 2019-12-18 NOTE — TOC Progression Note (Signed)
Transition of Care  [facility]) - Progression Note    Patient Details  Name: Ryan Orozco MRN: [id number] Date of Birth: 1965/03/04  Transition of Care [facility]) CM/SW Contact  Beckie Busing, RN Phone Number: [phone] 12/18/2019, 2:12 PM  Clinical Narrative:    CM follow up for LOG patient. CM called daughter to verify that disability application was submitted with medicaid claim. Daughter thinks both were filed but not sure. CM reached out to financial counselor Cathlean Marseilles who states that she will be filing the disability claim today. CM called [facility] to comfirm bed offer on the [facility]. Spoke with Shazma the admissions coordinator and Wilfred Lacy states that she was not aware of the patient being LOG. CM made Shazma aware that that internal notes were sent with the bed request informing facility that patient has a trach and is a LOG. Wilfred Lacy says informed CM that she would need to confirm that the facility can extend a bed offer and will call CM back. Will await return call.    Expected Discharge Plan: Long Term Acute Care (LTAC) Barriers to Discharge: Continued Medical Work up  Expected Discharge Plan and Services Expected Discharge Plan: Long Term Acute Care (LTAC)   Discharge Planning Services: CM Consult   Living arrangements for the past 2 months: Single Family Home                                       Social Determinants of Health (SDOH) Interventions    Readmission Risk Interventions No flowsheet data found.

## 2019-12-18 NOTE — Progress Notes (Signed)
Trach changed per MD order. Down sized to #4 Shiley flex CFS

## 2019-12-18 NOTE — Progress Notes (Signed)
NAMELeocadio Orozco, MRN:  [id number], DOB:  1965-02-23, LOS: 47 ADMISSION DATE:  11/01/2019, CONSULTATION DATE:  9/29 REFERRING MD:  EDP, CHIEF COMPLAINT:  Cardiac arrest   Brief History   54 y/o male with HIV found down outside a convenience store on 9/27, received out of hospital CPR.  After ICU admission has had acute encephalopathy, concern for anoxic brain injury.  Received a tracheostomy 10/13.    Past Medical History  ETOH Abuse HIV - dx 1995  Significant Hospital Events   9/29 Admit post PEA arrest.  UDS positive for THC, benzo's.  ETOH 177 10/05 EEG ongoing. Versed restarted overnight due to agitation. Nicardipine increased.  10/06 Developed tachypnea with WUA, no follow commands off sedation  10/07 Vomiting, TF held / restarted with recurrent vomiting 10/16 tracheostomy collar for 9 hours 10/18->10/19 off vent all night  10/20> TC Consults:  Neurology  Procedures:  ETT 9/29 >>10/13 Tracheostomy>>10/3  Significant Diagnostic Tests:  CT head 9/29 >  No evidence of acute intracranial abnormality. Moderate generalized cerebral atrophy, advanced for age. Chronic medially displaced fracture deformity of the right lamina papyracea. Mild ethmoid and maxillary sinus mucosal thickening.  CT cervical spine 9/29 > No evidence of acute fracture to the cervical spine. Partially imaged airspace disease within the imaged lung apices (extensive on the left). Clinical correlation is recommended. 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  EEG 9/29 > Patient was noted to have frequent episodes of axial jerking with eye opening. Concomitant EEG showed generalized polyspikes consistent with myoclonic seizures. EEG also showed continuous generalized background suppression. EEG was not reactive to tactile stimulation. Hyperventilation and photic stimulation were not performed  ECHO 9/30 > No evidence of wall  motion abnormality  MRI 10/3 > Mild DWI hyperintensity involving the caudate and possibly putamen bilaterally. Additionally, possible subtle FLAIR hyperintensity involving the cerebellum, cortex diffusely, and bilateral basal ganglia. Although not diagnostic, these findings could be seen with early hypoxic/ischemic brain injury in this patient status post PEA arrest.   EEG 10/08> severe diffuse encephalopathy likely related to anoxic/hypoxic brain injury  Testicular US 10/10> showed large left hydrocele and left inguinal hernia. No evidence of testicular mass.  MRI brain 10/11: evolving hypoxic, anoxic brain injury compared to previous MRI   CT abdomen pelvis 10/12: Right inguinal hernia and partial small bowel obstruction, large left inguinal hernia  Abdominal x-ray: Small bowel obstruction  Micro Data:  BCx2 9/29 >> negative  Urine 9/29 >> UA few bacteria, WBC, RBC, CaOxalate, and Mucus Covid, Flu A/B 9/29 >> negative Tracheal aspirate 10/5 >> normal flora   Antimicrobials:  Vancomycin 9/29 >> 9/30 Cefepime 9/29 >> 9/30 Cefepime 10/5 >> 10/11  Interim history/subjective:  Lying in bed and interactive with spontaneous movement Mentation consistent with significant anoxic injury Continues to tolerate ATC well  Objective   Blood pressure 114/68, pulse 82, temperature 98.5 F (36.9 C), temperature source Axillary, resp. rate 18, height 5\' 6"  (1.676 m), weight 75.9 kg, SpO2 96 %.    FiO2 (%):  [21 %] 21 %   Intake/Output Summary (Last 24 hours) at 12/18/2019 0748 Last data filed at 12/17/2019 2100 Gross per 24 hour  Intake 60 ml  Output 750 ml  Net -690 ml   Filed Weights   12/15/19 0500 12/18/19 0029 12/18/19 0113  Weight: 73.7 kg 75.9 kg 75.9 kg   Physical Exam: General: Encephalopathic middle aged male lying in bed  in NAD  HEENT: #6 shiley uncuffed trach midline , MM pink/moist, PERRL,  Neuro: Alert and interactive but encephalopathic with known anoxic injury    CV: s1s2 regular rate and rhythm, no murmur, rubs, or gallops,  PULM: Clear to auscultation bilaterally, no tracheal secretions, no cough GI: soft, bowel sounds active in all 4 quadrants, non-tender, non-distended Extremities: warm/dry, no edema  Skin: no rashes or lesions  Resolved Hospital Problem list   Partial small bowel obstruction > resolved  Assessment & Plan:  54 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 S/p trach due to failure to wean from ventilator  Trach in place for airway protection Anoxic brain injury secondary to cardiac arrest c/b myoclonic seizures P: Remains on trach collar tolerating well No issues with secretions overnight can likely downsized to 4 cuffless trach today Patient tolerates downsize can consider capping trial seen Encourage pulmonary hygiene Routine trach care Continue humidified oxygen SLP continues to follow for PMV trials   Rest of patient's chronic and acute medical conditions managed by primary  PCCM will continue to follow for weekly for trach care   Critical care time: n/a minutes  Delfin Gant, NP-C [facility] Contact / Pager information can be found on Amion  12/18/2019, 7:53 AM

## 2019-12-18 NOTE — Progress Notes (Signed)
Physical Therapy Treatment Patient Details Name: Ryan Orozco MRN: [id number] DOB: 23-Mar-1965 Today's Date: 12/18/2019    History of Present Illness 54 year old male found down outside convenience store 9/27. Pt with PEA and hypoxic brain injury. Trach 10/13, PEG 10/15. PMHx: HIV AIDS and alcohol abuse    PT Comments    Pt was assisted to be repositioned and move on the bed, with max assist to scoot and roll.  Pt also was given stretch and strengthening ex to BLE's, with RLE requiring the most work due to flexion tone of leg.  Pt is in prevalon boot at end of session to protect R heel from softening which has occurred.  Follow up with transfers as pt can tolerate WB through RLE with two person help needed.  Would benefit from being up in a chair for endurance and normalization of midline, as well as WB through RLE to integrate tone and normalize control of RLE mm's.   Follow Up Recommendations  SNF     Equipment Recommendations  Wheelchair (measurements PT);Wheelchair cushion (measurements PT);Hospital bed;Other (comment)    Recommendations for Other Services       Precautions / Restrictions Precautions Precautions: Fall Precaution Comments: trach, PEG, anoxic, flexor tone UE and  LE Restrictions Weight Bearing Restrictions: No Other Position/Activity Restrictions: has soft heels    Mobility  Bed Mobility Overal bed mobility: Needs Assistance Bed Mobility: Rolling Rolling: Max assist (lateral supine scooting max assist)         General bed mobility comments: max assist to move on the bed, to reposition  Transfers                 General transfer comment: two person help needed  Ambulation/Gait                 Stairs             Wheelchair Mobility    Modified Rankin (Stroke Patients Only)       Balance Overall balance assessment: Needs assistance Sitting-balance support: Feet supported;Bilateral upper extremity supported Sitting  balance-Leahy Scale: Zero                                      Cognition Arousal/Alertness: Awake/alert Behavior During Therapy: Flat affect Overall Cognitive Status: Impaired/Different from baseline Area of Impairment: Problem solving;Awareness;Safety/judgement;Following commands;Memory;Attention;Orientation               Rancho Levels of Cognitive Functioning Rancho Los Amigos Scales of Cognitive Functioning: Confused/inappropriate/non-agitated Orientation Level: Situation Current Attention Level: Selective Memory: Decreased short-term memory Following Commands: Follows one step commands inconsistently;Follows one step commands with increased time Safety/Judgement: Decreased awareness of deficits Awareness: Intellectual Problem Solving: Slow processing;Requires tactile cues;Requires verbal cues General Comments: pt is in flexed posture and stiff in LE's, worked on getting him more functionally postured and applied Prevalon to R foot due to soft heel      Exercises      General Comments General comments (skin integrity, edema, etc.): pt is following instructions in limited effort to do AROM to both legs, requires repetitive cues and ability to look at LE's to move directionally.  Weaker on RLE to extend due to his flexion tone but can assist PF, knee ext, hip est and adduction      Pertinent Vitals/Pain Pain Assessment: No/denies pain Faces Pain Scale: Hurts a little bit Pain Descriptors / Indicators: Guarding Pain Intervention(s):  Limited activity within patient's tolerance;Repositioned    Home Living                      Prior Function            PT Goals (current goals can now be found in the care plan section) Acute Rehab PT Goals Patient Stated Goal: none stated Progress towards PT goals: Progressing toward goals    Frequency    Min 2X/week      PT Plan Current plan remains appropriate    Co-evaluation               AM-PAC PT "6 Clicks" Mobility   Outcome Measure  Help needed turning from your back to your side while in a flat bed without using bedrails?: A Lot Help needed moving from lying on your back to sitting on the side of a flat bed without using bedrails?: Total Help needed moving to and from a bed to a chair (including a wheelchair)?: Total Help needed standing up from a chair using your arms (e.g., wheelchair or bedside chair)?: Total Help needed to walk in hospital room?: Total Help needed climbing 3-5 steps with a railing? : Total 6 Click Score: 7    End of Session Equipment Utilized During Treatment: Oxygen Activity Tolerance: Patient tolerated treatment well Patient left: in bed;with call bell/phone within reach;with bed alarm set Nurse Communication: Mobility status;Other (comment) (repositioning for skin protection) PT Visit Diagnosis: Other abnormalities of gait and mobility (R26.89);Muscle weakness (generalized) (M62.81)     Time: 9937-1696 PT Time Calculation (min) (ACUTE ONLY): 24 min  Charges:  $Therapeutic Exercise: 8-22 mins $Therapeutic Activity: 8-22 mins                     Ivar Drape 12/18/2019, 2:04 PM  Samul Dada, PT MS Acute Rehab Dept. Number: [facility] [id number] and [facility] [phone]

## 2019-12-19 LAB — GLUCOSE, CAPILLARY
Glucose-Capillary: 103 mg/dL — ABNORMAL HIGH (ref 70–99)
Glucose-Capillary: 124 mg/dL — ABNORMAL HIGH (ref 70–99)
Glucose-Capillary: 105 mg/dL — ABNORMAL HIGH (ref 70–99)
Glucose-Capillary: 122 mg/dL — ABNORMAL HIGH (ref 70–99)
Glucose-Capillary: 102 mg/dL — ABNORMAL HIGH (ref 70–99)
Glucose-Capillary: 102 mg/dL — ABNORMAL HIGH (ref 70–99)

## 2019-12-19 MED ORDER — BACLOFEN 10 MG PO TABS
5.0000 mg | ORAL_TABLET | Freq: Three times a day (TID) | ORAL | Status: DC
  Administered 2019-12-19 – 2019-12-21 (×9): 5 mg
  Filled 2019-12-19 (×9): qty 1

## 2019-12-19 MED ORDER — JUVEN PO PACK
1.0000 | PACK | Freq: Two times a day (BID) | ORAL | Status: DC
  Administered 2019-12-19 – 2020-01-08 (×42): 1
  Filled 2019-12-19 (×43): qty 1

## 2019-12-19 MED ORDER — DARUN-COBIC-EMTRICIT-TENOFAF 800-150-200-10 MG PO TABS
1.0000 | ORAL_TABLET | Freq: Every day | ORAL | Status: DC
  Administered 2019-12-19 – 2020-04-08 (×111): 1 via ORAL
  Filled 2019-12-19 (×116): qty 1

## 2019-12-19 NOTE — Progress Notes (Addendum)
[facility] PROGRESS NOTE  Ryan Orozco BPZ:[id number] DOB: 1965/11/13 DOA: 11/01/2019 PCP: Default, Provider, MD    11/16   Status: Remains inpatient appropriate because:Altered mental status, Unsafe d/c plan and Inpatient level of care appropriate due to severity of illness. Patient newly diagnosed with anoxic brain injury. Still has excessive tracheal secretions patient is managing primarily by strong cough to expectorate.  Dispo: The patient is from: Home              Anticipated d/c is to: SNF              Anticipated d/c date is: > 3 days              Patient currently is not medically stable to d/c.  PMV training in process. Medicaid application in process. Patient will also need to have disability paperwork completed since do not expect full recovery.  Tube has been in place greater than 30 days.   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  EMS found him pulseless in PEA, unknown downtime, successfully resuscitated and brought to the emergency department.  UDS positive for THC/benzodiazepines.  CT head unremarkable.  Failed extubation trials due to severe anoxic brain injury and had tracheostomy placed.  PEG placed on 10/15.  Now the plan is for placement in a skilled nursing facility/LTAC.  Prolonged hospital course.  Difficult placement. TOC following  Significant Hospital Events   9/29 Admit post PEA arrest. UDS positive for THC, benzo's. ETOH 177 10/05 EEG ongoing. Versed restarted overnight due to agitation. Nicardipine increased.  10/06 Developed tachypnea with WUA, no follow commands off sedation  10/07  Vomiting, TF held / restarted with recurrent vomiting 10/16 tracheostomy collar for 9 hours 10/18->10/19 off vent all night  10/20> TC  Subjective: Sleeping and briefly awakened during examination.  This is typical for him in the mornings but I have also started low-dose baclofen which could be influencing his alertness as he adjusts.  Objective: Vitals:   12/19/19 0508 12/19/19 0812  BP: 117/68   Pulse: 86 82  Resp: 18 18  Temp: 98.6 F (37 C)   SpO2: 98% 98%    Intake/Output Summary (Last 24 hours) at 12/19/2019 1114 Last data filed at 12/19/2019 0259 Gross per 24 hour  Intake 528 ml  Output 950 ml  Net -422 ml   Filed Weights   12/18/19 0113 12/18/19 2226 12/19/19 0109  Weight: 75.9 kg 75.2 kg 75.2 kg    Exam: Constitutional: NAD, calm, quite alert and fidgety, smiles Respiratory: Clear to auscultation bilaterally, #4 cuffless trach with 21% FiO2/TC; no tracheal secretions noted Cardiovascular: Regular rate and rhythm, no murmurs, Adequate capillary refill Abdomen: non tender, bowel sounds positive.  Tolerating current tube feeding via PEG tube Musculoskeletal: May have some lower extremity contractures but exam limited by patient's significant upper and lower extremity dystonic movements with activity. Skin: Several decubiti as described below Neurologic: Essentially sleeping but at baseline CN 2-12 grossly intact definitive bilateral disconjugate gaze today,. Sensation appears to be intact both upper and lower extremities, difficult to test strength in upper extremities but appears to be 2/5 with lower extremities 2-3/5 Psychiatric: Very sleepy but briefly awaken to  name    Assessment/Plan: Acute problems: Anoxic brain injury 2/2 PEA arrest:  -Presumed 2/2 combination alcohol and BZD overdose with UDS positive for THC and BZDs -Anticipate will not recover fully from this event or recover to previous baseline therefore long-term SNF placement expected -Continue  PT/OT/SLP -For ongoing dystonic movements of extremities low-dose baclofen TID added on 11/15 -monitor for response  Acute hypoxemic respiratory failure/new tracheostomy tube requirement -Decreased frequency of secretions and suctioning-able to cough up majority of secretions -Continue PMV training per SLP -Trach team following-downsized to #4 cuffless trach 11/15 to be followed by capping trial -Tracheostomy tube placed on 10/3   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   -Continue Keppra-check levetiracetam level -no further seizure activity since transitioned out of ICU setting  Dysphagia 2/2 anoxic brain injury -Once tolerating PMV can assess swallowing more thoroughly -Continue tube feeding per PEG  Goals of care:  -Palliative care was involved during this hospitalization.   -Goals of care were discussed. remains full code.  - Palliative care recommended outpatient follow-up with palliative providers.  Protein calorie malnutrition nutrition Status: Nutrition Problem: Increased nutrient needs Etiology: acute illness Signs/Symptoms: estimated needs Interventions: Tube feeding Estimated body mass index is 26.76 kg/m as calculated from the following:   Height as of this encounter: 5' 6"  (1.676 m).   Weight as of this encounter: 75.2 kg.   Multiple decubiti not POA Pressure Injury 11/13/19 Anus Stage 3 -  Full thickness tissue loss. Subcutaneous fat may be visible but bone, tendon or muscle are NOT exposed. (Active)  Date First Assessed/Time First Assessed: 11/13/19 1000   Location: Anus  Staging: Stage 3 -  Full thickness tissue loss. Subcutaneous fat may be visible but bone, tendon or muscle are NOT exposed.  Present on Admission: No    Assessments 11/17/2019  8:00 AM 12/18/2019  8:00 PM  Dressing Type None Foam - Lift dressing to assess site every shift  Dressing -- Clean;Intact;Dry  Dressing Change Frequency -- PRN  State of Healing -- Early/partial  granulation  Site / Wound Assessment Pink Clean;Dry;Pink  Peri-wound Assessment Intact Intact  Drainage Amount None None     No Linked orders to display     Wound / Incision (Open or Dehisced) 11/17/19 Puncture Abdomen Left;Anterior;Upper G-tube insertion site  (Active)  Date First Assessed/Time First Assessed: 11/17/19 1646   Wound Type: Puncture  Location: Abdomen  Location Orientation: Left;Anterior;Upper  Wound Description (Comments): G-tube insertion site   Present on Admission: No    Assessments 11/17/2019  4:47 PM 12/15/2019  8:31 AM  Dressing Type Gauze (Comment);Tape dressing None  Dressing Changed New --  Dressing Status Clean;Dry;Intact --  Dressing Change Frequency PRN --  Site / Wound Assessment Clean;Dry;Pink --  Peri-wound Assessment Intact --  Closure None --  Drainage Amount None --  Treatment Cleansed --     No Linked orders to display     Pressure Injury 12/01/19 Ankle Left;Lateral Stage 2 -  Partial thickness loss of dermis presenting as a shallow open injury with a red, pink wound bed without slough. (Active)  Date First Assessed/Time First Assessed: 12/01/19 2002   Location: Ankle  Location Orientation: Left;Lateral  Staging: Stage 2 -  Partial thickness loss of dermis presenting as a shallow open injury with a red, pink wound bed without slough.    Assessments 12/01/2019  7:52 PM 12/13/2019 10:00 AM  Dressing Type Foam - Lift dressing to assess site every shift --  Dressing Clean;Dry;Intact --  Site / Wound Assessment Pink --  Peri-wound Assessment Intact --  Wound Length (cm) -- 2.5 cm  Wound Width (cm) -- 0.5 cm  Wound Depth (cm) -- 0 cm  Wound Surface Area (cm^2) -- 1.25 cm^2  Wound Volume (cm^3) -- 0 cm^3  Margins Unattached edges (unapproximated) --  Drainage Amount Scant --  Drainage Description Serosanguineous --  Treatment Cleansed --     No Linked orders to display      Other problems: Hypertension/grade 1 diastolic dysfunction:  -Blood  pressure stable on amlodipine  -Echocardiogram this admission with preserved LVEF with evidence of grade 1 diastolic dysfunction. No physiology currently consistent with heart failure  HIV:  -CD4 count of 124.  -Dc'd Tivicay and Descovy infavor of Biktarvy to for eventual dc to SNF -Continue prophylactic Bactrim  Inguinal hernias -Evaluated by general surgery this admission. Documented as chronically incarcerated. Patient has been clinically stable and tolerating tube feedings and having bowel movements so unless patient obstructed would not be considered a candidate for repair. Even if obstructed consulting surgeon was not sure he would be a candidate for hernia repair regardless .   Data Reviewed: Basic Metabolic Panel: No results for input(s): NA, K, CL, CO2, GLUCOSE, BUN, CREATININE, CALCIUM, MG, PHOS in the last 168 hours. Liver Function Tests: No results for input(s): AST, ALT, ALKPHOS, BILITOT, PROT, ALBUMIN in the last 168 hours. No results for input(s): LIPASE, AMYLASE in the last 168 hours. No results for input(s): AMMONIA in the last 168 hours. CBC: No results for input(s): WBC, NEUTROABS, HGB, HCT, MCV, PLT in the last 168 hours. Cardiac Enzymes: No results for input(s): CKTOTAL, CKMB, CKMBINDEX, TROPONINI in the last 168 hours. BNP (last 3 results) No results for input(s): BNP in the last 8760 hours.  ProBNP (last 3 results) No results for input(s): PROBNP in the last 8760 hours.  CBG: Recent Labs  Lab 12/18/19 1359 12/18/19 1952 12/18/19 2305 12/19/19 0346 12/19/19 0749  GLUCAP 109* 129* 105* 103* 124*    No results found for this or any previous visit (from the past 240 hour(s)).   Studies: No results found.  Scheduled Meds: . amLODipine  10 mg Per Tube Daily  . baclofen  5 mg Per Tube TID  . chlorhexidine gluconate (MEDLINE KIT)  15 mL Mouth Rinse BID  . Chlorhexidine Gluconate Cloth  6 each Topical Q0600  .  Darunavir-Cobicisctat-Emtricitabine-Tenofovir Alafenamide  1 tablet Oral Q breakfast  . feeding supplement (PROSource TF)  45 mL Per Tube TID  . folic acid  1 mg Per Tube Daily  . free water  200 mL Per Tube Q4H  . heparin injection (subcutaneous)  5,000 Units Subcutaneous Q8H  . levETIRAcetam  1,500 mg Per Tube BID  . mouth rinse  15 mL Mouth Rinse 10 times per day  . nutrition supplement (JUVEN)  1 packet Per Tube BID BM  . nystatin   Topical TID  . scopolamine  1 patch Transdermal Q72H  . sulfamethoxazole-trimethoprim  1 tablet Per Tube Once per day on Mon Wed Fri  . thiamine  100 mg Per Tube Daily   Continuous Infusions: . feeding supplement (OSMOLITE 1.5 CAL) 1,000 mL (12/19/19 0914)    Active Problems:   Cardiac arrest ([place])   Community acquired pneumonia of left upper lobe of lung   Anoxic brain injury ([place])   Alcohol abuse   Acute respiratory failure with hypoxia ([facility])   Vomiting   Pressure injury of skin   Small bowel  obstruction [facility])   Palliative care encounter   Bowel obstruction ([place])   Chronic respiratory failure ([facility])   Diastolic dysfunction   Tracheostomy dependent ([place])   Dysphagia   Protein-calorie malnutrition ([place])   Seizures ([place])   Bilateral recurrent inguinal hernia   Consultants:  PCCM  Palliative medicine  General surgery  Neurology  Interventional radiology  Procedures:  EEG  Echocardiogram  Tracheostomy  Antibiotics: Anti-infectives (From admission, onward)   Start     Dose/Rate Route Frequency Ordered Stop   12/19/19 0930  Darunavir-Cobicisctat-Emtricitabine-Tenofovir Alafenamide (SYMTUZA) 800-150-200-10 MG TABS 1 tablet        1 tablet Oral Daily with breakfast 12/19/19 0840     12/13/19 1000  bictegravir-emtricitabine-tenofovir AF (BIKTARVY) 50-200-25 MG per tablet 1 tablet  Status:  Discontinued        1 tablet Oral Daily 12/12/19 1413 12/19/19 0840   11/17/19 1548  ceFAZolin (ANCEF) 2-4 GM/100ML-% IVPB       Note to  Pharmacy: Domenick Bookbinder   : cabinet override      11/17/19 1548 11/18/19 0359   11/16/19 1515  ceFAZolin (ANCEF) IVPB 2g/100 mL premix        2 g 200 mL/hr over 30 Minutes Intravenous To Radiology 11/16/19 1511 11/17/19 1625   11/15/19 0900  sulfamethoxazole-trimethoprim (BACTRIM DS) 800-160 MG per tablet 1 tablet        1 tablet Per Tube Once per day on Mon Wed Fri 11/13/19 1906     11/13/19 1615  dolutegravir (TIVICAY) tablet 50 mg  Status:  Discontinued        50 mg Oral Daily 11/13/19 1521 12/12/19 1413   11/13/19 1615  emtricitabine-tenofovir AF (DESCOVY) 200-25 MG per tablet 1 tablet  Status:  Discontinued        1 tablet Per Tube Daily 11/13/19 1521 12/12/19 1413   11/13/19 1530  sulfamethoxazole-trimethoprim (BACTRIM DS) 800-160 MG per tablet 1 tablet  Status:  Discontinued        1 tablet Oral Once per day on Mon Wed Fri 11/13/19 1441 11/13/19 1906   11/13/19 1500  bictegravir-emtricitabine-tenofovir AF (BIKTARVY) 50-200-25 MG per tablet 1 tablet  Status:  Discontinued        1 tablet Oral Daily 11/13/19 1403 11/13/19 1521   11/10/19 1000  erythromycin 250 mg in sodium chloride 0.9 % 100 mL IVPB        250 mg 100 mL/hr over 60 Minutes Intravenous Every 8 hours 11/10/19 0907 11/11/19 2000   11/07/19 1200  ceFEPIme (MAXIPIME) 2 g in sodium chloride 0.9 % 100 mL IVPB        2 g 200 mL/hr over 30 Minutes Intravenous Every 8 hours 11/07/19 1013 11/13/19 2127   11/03/19 1200  cefTRIAXone (ROCEPHIN) 2 g in sodium chloride 0.9 % 100 mL IVPB        2 g 200 mL/hr over 30 Minutes Intravenous Every 24 hours 11/03/19 0922 11/05/19 1427   11/02/19 0800  vancomycin (VANCOREADY) IVPB 750 mg/150 mL  Status:  Discontinued        750 mg 150 mL/hr over 60 Minutes Intravenous Every 12 hours 11/01/19 1935 11/02/19 1105   11/02/19 0600  piperacillin-tazobactam (ZOSYN) IVPB 3.375 g  Status:  Discontinued        3.375 g 12.5 mL/hr over 240 Minutes Intravenous Every 8 hours 11/02/19 0311 11/03/19 0920    11/01/19 2200  ceFEPIme (MAXIPIME) 2 g in sodium chloride 0.9 % 100 mL IVPB  Status:  Discontinued  2 g 200 mL/hr over 30 Minutes Intravenous Every 12 hours 11/01/19 2143 11/02/19 0311   11/01/19 1915  vancomycin (VANCOREADY) IVPB 1500 mg/300 mL        1,500 mg 150 mL/hr over 120 Minutes Intravenous  Once 11/01/19 1909 11/01/19 2147   11/01/19 1845  cefTRIAXone (ROCEPHIN) 1 g in sodium chloride 0.9 % 100 mL IVPB        1 g 200 mL/hr over 30 Minutes Intravenous  Once 11/01/19 1842 11/01/19 2051   11/01/19 1845  azithromycin (ZITHROMAX) 500 mg in sodium chloride 0.9 % 250 mL IVPB        500 mg 250 mL/hr over 60 Minutes Intravenous  Once 11/01/19 1842 11/01/19 2051       Time spent: 15 minutes    Erin Hearing ANP  [facility] Pager [phone]. If 7PM-7AM, please contact night-coverage at [email or web address] 12/19/2019, 11:14 AM  LOS: 48 days

## 2019-12-19 NOTE — Progress Notes (Signed)
Pt doesn't have the ability to swallow yet so we will change his Biktarvy to [facility] because it can be crushed. D/w [name].  Ulyses Southward, PharmD, BCIDP, AAHIVP, CPP Infectious Disease Pharmacist 12/19/2019 8:43 AM

## 2019-12-19 NOTE — Plan of Care (Signed)
  Problem: Education: Goal: Knowledge of General Education information will improve Description: Including pain rating scale, medication(s)/side effects and non-pharmacologic comfort measures Outcome: Not Applicable   Problem: Health Behavior/Discharge Planning: Goal: Ability to manage health-related needs will improve Outcome: Not Applicable   Problem: Clinical Measurements: Goal: Ability to maintain clinical measurements within normal limits will improve Outcome: Not Applicable Goal: Will remain free from infection Outcome: Not Applicable Goal: Diagnostic test results will improve Outcome: Not Applicable Goal: Respiratory complications will improve Outcome: Not Applicable Goal: Cardiovascular complication will be avoided Outcome: Not Applicable   Problem: Activity: Goal: Risk for activity intolerance will decrease Outcome: Not Applicable   Problem: Nutrition: Goal: Adequate nutrition will be maintained Outcome: Not Applicable   Problem: Coping: Goal: Level of anxiety will decrease Outcome: Not Applicable   Problem: Elimination: Goal: Will not experience complications related to bowel motility Outcome: Not Applicable Goal: Will not experience complications related to urinary retention Outcome: Not Applicable   Problem: Pain Managment: Goal: General experience of comfort will improve Outcome: Not Applicable   Problem: Safety: Goal: Ability to remain free from injury will improve Outcome: Not Applicable   Problem: Skin Integrity: Goal: Risk for impaired skin integrity will decrease Outcome: Not Applicable   Problem: Nutrition Goal: Nutritional status is improving Description: Monitor and assess patient for malnutrition (ex- brittle hair, bruises, dry skin, pale skin and conjunctiva, muscle wasting, smooth red tongue, and disorientation). Collaborate with interdisciplinary team and initiate plan and interventions as ordered.  Monitor patient's weight and dietary  intake as ordered or per policy. Utilize nutrition screening tool and intervene per policy. Determine patient's food preferences and provide high-protein, high-caloric foods as appropriate.  Outcome: Not Applicable   Problem: Education: Goal: Knowledge of the prescribed therapeutic regimen Outcome: Not Applicable Goal: Knowledge of disease or condition will improve Outcome: Not Applicable   Problem: Clinical Measurements: Goal: Neurologic status will improve Outcome: Not Applicable   Problem: Tissue Perfusion: Goal: Ability to maintain intracranial pressure will improve Outcome: Not Applicable   Problem: Respiratory: Goal: Will regain and/or maintain adequate ventilation Outcome: Not Applicable   Problem: Skin Integrity: Goal: Risk for impaired skin integrity will decrease Outcome: Not Applicable Goal: Demonstration of wound healing without infection will improve Outcome: Not Applicable   Problem: Psychosocial: Goal: Ability to verbalize positive feelings about self will improve Outcome: Not Applicable Goal: Ability to participate in self-care as condition permits will improve Outcome: Not Applicable Goal: Ability to identify appropriate support needs will improve Outcome: Not Applicable   Problem: Health Behavior/Discharge Planning: Goal: Ability to manage health-related needs will improve Outcome: Not Applicable   Problem: Nutritional: Goal: Risk of aspiration will decrease Outcome: Not Applicable Goal: Dietary intake will improve Outcome: Not Applicable   Problem: Communication: Goal: Ability to communicate needs accurately will improve Outcome: Not Applicable

## 2019-12-19 NOTE — TOC Progression Note (Signed)
Transition of Care [facility]) - Progression Note    Patient Details  Name: Ryan Orozco MRN: [id number] Date of Birth: May 12, 1965  Transition of Care [facility]) CM/SW Contact  Beckie Busing, RN Phone Number: [phone]  12/19/2019, 11:00 AM  Clinical Narrative:    CM called admissions coordinator with [facility] to determine if the patient can discharge to the facility. Wilfred Lacy states that the facility does not accept letter of guarantees.    Expected Discharge Plan: Long Term Acute Care (LTAC) Barriers to Discharge: Continued Medical Work up  Expected Discharge Plan and Services Expected Discharge Plan: Long Term Acute Care (LTAC)   Discharge Planning Services: CM Consult   Living arrangements for the past 2 months: Single Family Home                                       Social Determinants of Health (SDOH) Interventions    Readmission Risk Interventions No flowsheet data found.

## 2019-12-19 NOTE — Progress Notes (Signed)
  Speech Language Pathology Treatment: Cognitive-Linquistic;Passy Muir Speaking valve  Patient Details Name: Ryan Orozco MRN: [id number] DOB: 1965-05-07 Today's Date: 12/19/2019 Time: 5038-8828 SLP Time Calculation (min) (ACUTE ONLY): 20 min  Assessment / Plan / Recommendation Clinical Impression  Bandon showed purposeful attempts to imitate phonemes/words/phrases with PMV donned. He is able to focus on attention to SLP for 3 seconds. Spontaneous vocalic sounds audible and SLP able to assist in shaping sounds to labial and verbal approximations for /b/, /r/- labial only and during counting with #2. Two-three word phrases with 1-2 approximations. Asked yes no question related to his temperature in room and responses were very similar "yes" "no". Unable to follow commands using right hand due to motor concerns. Opened mouth x 1 and surprisingly attempted to wink at therapist twice when asked (more of a blink). Therapist will continue moving toward Amilcar making basic needs met targeting yes/no initially. No air trapping with valve, all vitals in normal range and intensity adequate.    HPI HPI: 54 y/o male with hx of HIV, ETOH abuse found down outside a convenience store on 9/27, received out of hospital CPR. Presents with acute hypoxic respiratory failure s/p cardiac arrest, acute encephalopathy due to hypoxic brain injury. Intubated 9/27-10/13. Received a tracheostomy 10/13, treachestomy collar trial for 9 hours on 10/16. CXR on 10/17 improving and shows left lung opacities have essentially resolved      SLP Plan  Continue with current plan of care       Recommendations         Patient may use Passy-Muir Speech Valve: During all therapies with supervision PMSV Supervision: Full         Oral Care Recommendations: Oral care QID Follow up Recommendations: LTACH;Skilled Nursing facility SLP Visit Diagnosis: Aphonia (R49.1);Cognitive communication deficit (M03.491) Plan: Continue  with current plan of care       GO                Houston Siren 12/19/2019, 4:30 PM

## 2019-12-20 LAB — GLUCOSE, CAPILLARY
Glucose-Capillary: 109 mg/dL — ABNORMAL HIGH (ref 70–99)
Glucose-Capillary: 101 mg/dL — ABNORMAL HIGH (ref 70–99)
Glucose-Capillary: 113 mg/dL — ABNORMAL HIGH (ref 70–99)
Glucose-Capillary: 95 mg/dL (ref 70–99)
Glucose-Capillary: 111 mg/dL — ABNORMAL HIGH (ref 70–99)
Glucose-Capillary: 116 mg/dL — ABNORMAL HIGH (ref 70–99)

## 2019-12-20 NOTE — Progress Notes (Signed)
[facility] PROGRESS NOTE  Ryan Orozco ZTI:[id number] DOB: [birth year] DOA: 11/01/2019 PCP: Default, Provider, MD    11/16   Status: Remains inpatient appropriate because:Altered mental status, Unsafe d/c plan and Inpatient level of care appropriate due to severity of illness. Patient newly diagnosed with anoxic brain injury  Dispo: The patient is from: Home              Anticipated d/c is to: SNF              Anticipated d/c date is: > 3 days              Patient currently is not medically stable to d/c.  PMV training in process. Medicaid application in process. Patient will also need to have disability paperwork completed since do not expect full recovery.  Trach Tube has been in place greater than 30 days.   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  EMS found him pulseless in PEA, unknown downtime, successfully resuscitated and brought to the emergency department.  UDS positive for THC/benzodiazepines.  CT head unremarkable.  Failed extubation trials due to severe anoxic brain injury and had tracheostomy placed.  PEG placed on 10/15.  Now the plan is for placement in a skilled nursing facility/LTAC.  Prolonged hospital course.  Difficult placement. TOC following  Significant Hospital Events   9/29 Admit post PEA arrest. UDS positive for THC, benzo's. ETOH 177 10/05 EEG ongoing. Versed restarted overnight due to agitation. Nicardipine increased.  10/06 Developed tachypnea with WUA, no follow commands off sedation  10/07 Vomiting, TF held / restarted with recurrent vomiting 10/16 tracheostomy collar for 9  hours 10/18->10/19 off vent all night  10/20> TC  Subjective: Awake.  PMV not in place but I occluded trach with dressing and patient attempted to phonate.  He attempted to say hello and his name Ryan Orozco but speech was slurred and some of the letters/syllables were incomplete.  No apparent pain when asked.  Objective: Vitals:   12/20/19 0729 12/20/19 1043  BP:  120/78  Pulse: 71 71  Resp: 20   Temp:  (!) 97.5 F (36.4 C)  SpO2: 97% 100%    Intake/Output Summary (Last 24 hours) at 12/20/2019 1055 Last data filed at 12/20/2019 0601 Gross per 24 hour  Intake 2403 ml  Output 175 ml  Net 2228 ml   Filed Weights   12/19/19 0109 12/19/19 2147 12/20/19 0106  Weight: 75.2 kg 74.2 kg 74.2 kg    Exam: Constitutional: NAD, calm, smiles Respiratory: Clear to auscultation bilaterally, #4 cuffless trach with 21% FiO2/TC; no tracheal secretions noted Cardiovascular: Regular rate and rhythm, no murmurs, skin warm and dry Abdomen: non tender, bowel sounds positive.  Tolerating current tube feeding via PEG tube Musculoskeletal: Dystonic movements appear slightly improved although more prominent on left side. Skin: Several decubiti as described below Neurologic: CN 2-12 grossly intact definitive bilateral disconjugate gaze today,. Sensation intact both upper and lower extremities, difficult to test strength in upper extremities but appears to be 2/5 with lower extremities 2-3/5.  Attempted to briefly follow simple commands with right upper extremity. Psychiatric: Oriented times name.  Will to state name.  Happy affect   Assessment/Plan:  Acute problems: Anoxic brain injury 2/2 PEA arrest:  -Presumed 2/2 combination alcohol and BZD overdose with UDS positive for THC and BZDs -Anticipate will not recover fully from this event/recover to previous baseline therefore long-term SNF placement recommended -Continue PT/OT/SLP -Continue low-dose baclofen for dystonic movements   Acute hypoxemic  respiratory failure/new tracheostomy tube requirement -Continue PMV training per SLP -Trach team following-downsized to #4 cuffless trach 11/15 to be followed by capping trial -Tracheostomy tube placed on 10/3   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   -Continue Keppra-levetiracetam level ending -no further seizure activity since transitioned out of ICU setting  Dysphagia 2/2 anoxic brain injury -Once tolerating PMV can assess swallowing more thoroughly -Continue tube feeding per PEG  Goals of care:  -Palliative care was involved during this hospitalization.   -Goals of care were discussed. remains full code.  - Palliative care recommended outpatient follow-up with palliative providers.  Protein calorie malnutrition nutrition Status: Nutrition Problem: Increased nutrient needs Etiology: acute illness Signs/Symptoms: estimated needs Interventions: Tube feeding Estimated body mass index is 26.39 kg/m as calculated from the following:   Height as of this encounter: 5' 6"  (1.676 m).   Weight as of this encounter: 74.2 kg.   Multiple decubiti not POA Pressure Injury 11/13/19 Anus Stage 3 -  Full thickness tissue loss. Subcutaneous fat may be visible but bone, tendon or muscle are NOT exposed. (Active)  Date First Assessed/Time First Assessed: 11/13/19 1000   Location: Anus  Staging: Stage 3 -  Full thickness tissue loss. Subcutaneous fat may be visible but bone, tendon or muscle are NOT exposed.  Present on Admission: No    Assessments 11/17/2019  8:00 AM 12/19/2019  8:30 AM  Dressing Type None Foam - Lift dressing to assess site every shift  Dressing -- Clean;Dry;Intact  Dressing Change Frequency -- Every 5 days  Site / Wound Assessment Pink --  Peri-wound Assessment Intact Intact  Drainage Amount None None  Drainage Description -- Other (Comment)     No Linked orders to display     Wound / Incision (Open or Dehisced) 11/17/19 Puncture Abdomen  Left;Anterior;Upper G-tube insertion site  (Active)  Date First Assessed/Time First Assessed: 11/17/19 1646   Wound Type: Puncture  Location: Abdomen  Location Orientation: Left;Anterior;Upper  Wound Description (Comments): G-tube insertion site   Present on Admission: No    Assessments 11/17/2019  4:47 PM 12/19/2019  8:30 AM  Dressing Type Gauze (Comment);Tape dressing None  Dressing Changed New --  Dressing Status Clean;Dry;Intact Clean;Intact;Dry  Dressing Change Frequency PRN PRN  Site / Wound Assessment Clean;Dry;Pink Other (Comment)  Peri-wound Assessment Intact --  Closure None --  Drainage Amount None --  Treatment Cleansed --     No Linked orders to display     Pressure Injury 12/01/19 Ankle Left;Lateral Stage 2 -  Partial thickness loss of dermis presenting as a shallow open injury with a red, pink wound bed without slough. (Active)  Date First Assessed/Time First Assessed: 12/01/19 2002   Location: Ankle  Location Orientation: Left;Lateral  Staging: Stage 2 -  Partial thickness loss of dermis presenting as a shallow open injury with a red, pink wound bed without slough.    Assessments 12/01/2019  7:52 PM 12/20/2019  9:00 AM  Dressing Type Foam - Lift dressing to assess site every shift --  Dressing Clean;Dry;Intact --  State of Healing -- Other (Comment)  Site / Wound Assessment Pink --  Peri-wound Assessment Intact --  Margins Unattached edges (  unapproximated) --  Drainage Amount Scant --  Drainage Description Serosanguineous --  Treatment Cleansed --     No Linked orders to display      Other problems: Hypertension/grade 1 diastolic dysfunction:  -Blood pressure stable on amlodipine  -Echocardiogram this admission with preserved LVEF with evidence of grade 1 diastolic dysfunction. No physiology currently consistent with heart failure  HIV:  -CD4 count of 124.  -Dc'd Tivicay and Descovy infavor of Biktarvy to for eventual dc to SNF -Continue prophylactic  Bactrim  Inguinal hernias -Evaluated by general surgery this admission. Documented as chronically incarcerated. Patient has been clinically stable and tolerating tube feedings and having bowel movements so unless patient obstructed would not be considered a candidate for repair. Even if obstructed consulting surgeon was not sure he would be a candidate for hernia repair regardless .   Data Reviewed: Basic Metabolic Panel: No results for input(s): NA, K, CL, CO2, GLUCOSE, BUN, CREATININE, CALCIUM, MG, PHOS in the last 168 hours. Liver Function Tests: No results for input(s): AST, ALT, ALKPHOS, BILITOT, PROT, ALBUMIN in the last 168 hours. No results for input(s): LIPASE, AMYLASE in the last 168 hours. No results for input(s): AMMONIA in the last 168 hours. CBC: No results for input(s): WBC, NEUTROABS, HGB, HCT, MCV, PLT in the last 168 hours. Cardiac Enzymes: No results for input(s): CKTOTAL, CKMB, CKMBINDEX, TROPONINI in the last 168 hours. BNP (last 3 results) No results for input(s): BNP in the last 8760 hours.  ProBNP (last 3 results) No results for input(s): PROBNP in the last 8760 hours.  CBG: Recent Labs  Lab 12/19/19 1604 12/19/19 2034 12/19/19 2312 12/20/19 0319 12/20/19 0744  GLUCAP 122* 102* 102* 109* 101*    No results found for this or any previous visit (from the past 240 hour(s)).   Studies: No results found.  Scheduled Meds: . amLODipine  10 mg Per Tube Daily  . baclofen  5 mg Per Tube TID  . chlorhexidine gluconate (MEDLINE KIT)  15 mL Mouth Rinse BID  . Chlorhexidine Gluconate Cloth  6 each Topical Q0600  . Darunavir-Cobicisctat-Emtricitabine-Tenofovir Alafenamide  1 tablet Oral Q breakfast  . feeding supplement (PROSource TF)  45 mL Per Tube TID  . folic acid  1 mg Per Tube Daily  . free water  200 mL Per Tube Q4H  . heparin injection (subcutaneous)  5,000 Units Subcutaneous Q8H  . levETIRAcetam  1,500 mg Per Tube BID  . mouth rinse  15 mL Mouth  Rinse 10 times per day  . nutrition supplement (JUVEN)  1 packet Per Tube BID BM  . nystatin   Topical TID  . scopolamine  1 patch Transdermal Q72H  . sulfamethoxazole-trimethoprim  1 tablet Per Tube Once per day on Mon Wed Fri  . thiamine  100 mg Per Tube Daily   Continuous Infusions: . feeding supplement (OSMOLITE 1.5 CAL) 1,000 mL (12/20/19 0436)    Active Problems:   Cardiac arrest ([place])   Community acquired pneumonia of left upper lobe of lung   Anoxic brain injury ([place])   Alcohol abuse   Acute respiratory failure with hypoxia ([facility])   Vomiting   Pressure injury of skin   Small bowel obstruction ([facility])   Palliative care encounter   Bowel obstruction ([facility])   Chronic respiratory failure ([facility])   Diastolic dysfunction   Tracheostomy dependent ([place])   Dysphagia   Protein-calorie malnutrition ([place])   Seizures ([place])   Bilateral recurrent inguinal hernia   Consultants:  PCCM  Palliative medicine  General surgery  Neurology  Interventional radiology  Procedures:  EEG  Echocardiogram  Tracheostomy  Antibiotics: Anti-infectives (From admission, onward)   Start     Dose/Rate Route Frequency Ordered Stop   12/19/19 0930  Darunavir-Cobicisctat-Emtricitabine-Tenofovir Alafenamide (SYMTUZA) 800-150-200-10 MG TABS 1 tablet        1 tablet Oral Daily with breakfast 12/19/19 0840     12/13/19 1000  bictegravir-emtricitabine-tenofovir AF (BIKTARVY) 50-200-25 MG per tablet 1 tablet  Status:  Discontinued        1 tablet Oral Daily 12/12/19 1413 12/19/19 0840   11/17/19 1548  ceFAZolin (ANCEF) 2-4 GM/100ML-% IVPB       Note to Pharmacy: Domenick Bookbinder   : cabinet override      11/17/19 1548 11/18/19 0359   11/16/19 1515  ceFAZolin (ANCEF) IVPB 2g/100 mL premix        2 g 200 mL/hr over 30 Minutes Intravenous To Radiology 11/16/19 1511 11/17/19 1625   11/15/19 0900  sulfamethoxazole-trimethoprim (BACTRIM DS) 800-160 MG per tablet 1 tablet        1 tablet Per Tube Once  per day on Mon Wed Fri 11/13/19 1906     11/13/19 1615  dolutegravir (TIVICAY) tablet 50 mg  Status:  Discontinued        50 mg Oral Daily 11/13/19 1521 12/12/19 1413   11/13/19 1615  emtricitabine-tenofovir AF (DESCOVY) 200-25 MG per tablet 1 tablet  Status:  Discontinued        1 tablet Per Tube Daily 11/13/19 1521 12/12/19 1413   11/13/19 1530  sulfamethoxazole-trimethoprim (BACTRIM DS) 800-160 MG per tablet 1 tablet  Status:  Discontinued        1 tablet Oral Once per day on Mon Wed Fri 11/13/19 1441 11/13/19 1906   11/13/19 1500  bictegravir-emtricitabine-tenofovir AF (BIKTARVY) 50-200-25 MG per tablet 1 tablet  Status:  Discontinued        1 tablet Oral Daily 11/13/19 1403 11/13/19 1521   11/10/19 1000  erythromycin 250 mg in sodium chloride 0.9 % 100 mL IVPB        250 mg 100 mL/hr over 60 Minutes Intravenous Every 8 hours 11/10/19 0907 11/11/19 2000   11/07/19 1200  ceFEPIme (MAXIPIME) 2 g in sodium chloride 0.9 % 100 mL IVPB        2 g 200 mL/hr over 30 Minutes Intravenous Every 8 hours 11/07/19 1013 11/13/19 2127   11/03/19 1200  cefTRIAXone (ROCEPHIN) 2 g in sodium chloride 0.9 % 100 mL IVPB        2 g 200 mL/hr over 30 Minutes Intravenous Every 24 hours 11/03/19 0922 11/05/19 1427   11/02/19 0800  vancomycin (VANCOREADY) IVPB 750 mg/150 mL  Status:  Discontinued        750 mg 150 mL/hr over 60 Minutes Intravenous Every 12 hours 11/01/19 1935 11/02/19 1105   11/02/19 0600  piperacillin-tazobactam (ZOSYN) IVPB 3.375 g  Status:  Discontinued        3.375 g 12.5 mL/hr over 240 Minutes Intravenous Every 8 hours 11/02/19 0311 11/03/19 0920   11/01/19 2200  ceFEPIme (MAXIPIME) 2 g in sodium chloride 0.9 % 100 mL IVPB  Status:  Discontinued        2 g 200 mL/hr over 30 Minutes Intravenous Every 12 hours 11/01/19 2143 11/02/19 0311   11/01/19 1915  vancomycin (VANCOREADY) IVPB 1500 mg/300 mL        1,500 mg 150 mL/hr over 120 Minutes Intravenous  Once 11/01/19 1909  11/01/19 2147    11/01/19 1845  cefTRIAXone (ROCEPHIN) 1 g in sodium chloride 0.9 % 100 mL IVPB        1 g 200 mL/hr over 30 Minutes Intravenous  Once 11/01/19 1842 11/01/19 2051   11/01/19 1845  azithromycin (ZITHROMAX) 500 mg in sodium chloride 0.9 % 250 mL IVPB        500 mg 250 mL/hr over 60 Minutes Intravenous  Once 11/01/19 1842 11/01/19 2051       Time spent: 15 minutes    Erin Hearing ANP  [facility] Pager [phone]. If 7PM-7AM, please contact night-coverage at [email or web address] 12/20/2019, 10:55 AM  LOS: 49 days

## 2019-12-20 NOTE — Progress Notes (Signed)
Nutrition Follow-up  DOCUMENTATION CODES:   Not applicable  INTERVENTION:  Continue tube feeds via PEG: - Osmolite 1.5@ 60 ml/hr (1440 ml/day) - ProSourceTF 45 mlTID - Juven BID - Free waterflushes of 200 ml q 4 hours  Tube feeding regimen provides 2470 kcals, 128 grams protein,1089m free water (2294mwith flushes).   NUTRITION DIAGNOSIS:   Increased nutrient needs related to acute illness as evidenced by estimated needs.  ongoing  GOAL:   Patient will meet greater than or equal to 90% of their needs  Met with TF  MONITOR:   Labs, Weight trends, TF tolerance, Skin, I & O's  REASON FOR ASSESSMENT:   Ventilator, Consult Enteral/tube feeding initiation and management  ASSESSMENT:   Patient with PMH significant for HIV and ETOH use. Presents this admission with cardiac arrest and suspected aspiration in setting of heavy ETOH use.  10/07 - vomiting, TF held, later restarted with recurrent vomiting 10/12 - R/L inguinal hernia, partial SBO  10/13 - trach 10/15 - PEG 10/18- ATC  Pt remains on ATC. Working with SLP and PMV. Pt no longer requiring frequent suctioning. Pt continues to receive TF via PEG which he is tolerating well per RN. Current TF: Osmolite 1.5 @ 60 ml/hr, ProSource TF 45 ml TID, free water flushes of 200 ml q 4 hours  Admission weight: 78.9 kg Current weight: 74.2 kg  Labs reviewed.  Medications: folvite, juven BID, thiamine  Diet Order:   Diet Order            Diet NPO time specified  Diet effective midnight                 EDUCATION NEEDS:   Not appropriate for education at this time  Skin:  Skin Assessment: Skin Integrity Issues: Skin Integrity Issues:: Stage II, Stage III, Other (Comment) Stage II: L ankle Stage III: anus Other: puncture abdomen (PEG insertion site)  Last BM:  12/13/19  Height:   Ht Readings from Last 1 Encounters:  11/01/19 5' 6"  (1.676 m)    Weight:   Wt Readings from Last 1 Encounters:   12/20/19 74.2 kg    BMI:  Body mass index is 26.39 kg/m.  Estimated Nutritional Needs:   Kcal:  235852-7782Protein:  115-130 g  Fluid:  >/= 2 L/day    AmLarkin InaMS, RD, LDN RD pager number and weekend/on-call pager number located in [facility] [place]

## 2019-12-20 NOTE — Plan of Care (Signed)
  Problem: Education: Goal: Knowledge of General Education information will improve Description: Including pain rating scale, medication(s)/side effects and non-pharmacologic comfort measures Outcome: Progressing   Problem: Health Behavior/Discharge Planning: Goal: Ability to manage health-related needs will improve Outcome: Progressing   Problem: Clinical Measurements: Goal: Ability to maintain clinical measurements within normal limits will improve Outcome: Progressing Goal: Will remain free from infection Outcome: Progressing Goal: Diagnostic test results will improve Outcome: Progressing Goal: Respiratory complications will improve Outcome: Progressing Goal: Cardiovascular complication will be avoided Outcome: Progressing   Problem: Activity: Goal: Risk for activity intolerance will decrease Outcome: Progressing   Problem: Nutrition: Goal: Adequate nutrition will be maintained Outcome: Progressing   Problem: Coping: Goal: Level of anxiety will decrease Outcome: Progressing   Problem: Elimination: Goal: Will not experience complications related to bowel motility Outcome: Progressing Goal: Will not experience complications related to urinary retention Outcome: Progressing   Problem: Pain Managment: Goal: General experience of comfort will improve Outcome: Progressing   Problem: Safety: Goal: Ability to remain free from injury will improve Outcome: Progressing   Problem: Skin Integrity: Goal: Risk for impaired skin integrity will decrease Outcome: Progressing   Problem: Education: Goal: 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 Outcome: Progressing   Problem: Activity: Goal: Ability to tolerate increased activity will improve Outcome: Progressing   Problem: Health Behavior/Discharge Planning: Goal: 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 Outcome: Progressing   Problem: Respiratory: Goal: Patent airway maintenance will  improve Outcome: Progressing   Problem: Role Relationship: Goal: Ability to communicate will improve Outcome: Progressing   

## 2019-12-21 ENCOUNTER — Other Ambulatory Visit: Payer: Self-pay

## 2019-12-21 LAB — LEVETIRACETAM LEVEL: Levetiracetam Lvl: 35.9 ug/mL (ref 10.0–40.0)

## 2019-12-21 LAB — GLUCOSE, CAPILLARY
Glucose-Capillary: 117 mg/dL — ABNORMAL HIGH (ref 70–99)
Glucose-Capillary: 121 mg/dL — ABNORMAL HIGH (ref 70–99)
Glucose-Capillary: 122 mg/dL — ABNORMAL HIGH (ref 70–99)
Glucose-Capillary: 107 mg/dL — ABNORMAL HIGH (ref 70–99)
Glucose-Capillary: 89 mg/dL (ref 70–99)
Glucose-Capillary: 123 mg/dL — ABNORMAL HIGH (ref 70–99)

## 2019-12-21 NOTE — Plan of Care (Signed)
  Problem: Education: Goal: Knowledge of General Education information will improve Description: Including pain rating scale, medication(s)/side effects and non-pharmacologic comfort measures Outcome: Progressing   Problem: Health Behavior/Discharge Planning: Goal: Ability to manage health-related needs will improve Outcome: Progressing   Problem: Clinical Measurements: Goal: Ability to maintain clinical measurements within normal limits will improve Outcome: Progressing Goal: Will remain free from infection Outcome: Progressing Goal: Diagnostic test results will improve Outcome: Progressing Goal: Respiratory complications will improve Outcome: Progressing Goal: Cardiovascular complication will be avoided Outcome: Progressing   Problem: Activity: Goal: Risk for activity intolerance will decrease Outcome: Progressing   Problem: Nutrition: Goal: Adequate nutrition will be maintained Outcome: Progressing   Problem: Coping: Goal: Level of anxiety will decrease Outcome: Progressing   Problem: Elimination: Goal: Will not experience complications related to bowel motility Outcome: Progressing Goal: Will not experience complications related to urinary retention Outcome: Progressing   Problem: Pain Managment: Goal: General experience of comfort will improve Outcome: Progressing   Problem: Safety: Goal: Ability to remain free from injury will improve Outcome: Progressing   Problem: Skin Integrity: Goal: Risk for impaired skin integrity will decrease Outcome: Progressing   Problem: Education: Goal: 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 Outcome: Progressing   Problem: Activity: Goal: Ability to tolerate increased activity will improve Outcome: Progressing   Problem: Health Behavior/Discharge Planning: Goal: 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 Outcome: Progressing   Problem: Respiratory: Goal: Patent airway maintenance will  improve Outcome: Progressing   Problem: Role Relationship: Goal: Ability to communicate will improve Outcome: Progressing   

## 2019-12-21 NOTE — Progress Notes (Signed)
[facility] PROGRESS NOTE  Festus [place] OQH:[id number] DOB: 1965-11-28 DOA: 11/01/2019 PCP: Default, Provider, MD    11/16   Status: Remains inpatient appropriate because:Altered mental status, Unsafe d/c plan and Inpatient level of care appropriate due to severity of illness. Patient newly diagnosed with anoxic brain injury  Dispo: The patient is from: Home              Anticipated d/c is to: SNF              Anticipated d/c date is: > 3 days              Patient currently is not medically stable to d/c.  PMV training in process. Medicaid application in process. Patient will also need to have disability paperwork completed since do not expect full recovery.  Trach Tube has been in place greater than 30 days.   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  EMS found him pulseless in PEA, unknown downtime, successfully resuscitated and brought to the emergency department.  UDS positive for THC/benzodiazepines.  CT head unremarkable.  Failed extubation trials due to severe anoxic brain injury and had tracheostomy placed.  PEG placed on 10/15.  Now the plan is for placement in a skilled nursing facility/LTAC.  Prolonged hospital course.  Difficult placement. TOC following  Significant Hospital Events   9/29 Admit post PEA arrest. UDS positive for THC, benzo's. ETOH 177 10/05 EEG ongoing. Versed restarted overnight due to agitation. Nicardipine increased.  10/06 Developed tachypnea with WUA, no follow commands off sedation  10/07 Vomiting, TF held / restarted with recurrent vomiting 10/16 tracheostomy collar for 9  hours 10/18->10/19 off vent all night  10/20> TC 11/15 > downsized to #4 Shiley flex CFS  Subjective: Sleeping soundly so did not awaken  Objective: Vitals:   12/21/19 0507 12/21/19 0800  BP: 110/66   Pulse: 70 74  Resp: 18 16  Temp: 97.7 F (36.5 C)   SpO2: 100% 96%    Intake/Output Summary (Last 24 hours) at 12/21/2019 1059 Last data filed at 12/21/2019 0343 Gross per 24 hour  Intake --  Output 350 ml  Net -350 ml   Filed Weights   12/20/19 0106 12/20/19 2101 12/21/19 0148  Weight: 74.2 kg 75.3 kg 75.3 kg    Exam: Constitutional: NAD, sleeping soundly and appears comfortable Respiratory: Clear to auscultation bilaterally upon anterior auscultation, #4 cuffless trach with 21% FiO2/TC; no tracheal secretions noted Cardiovascular: Regular rate and rhythm, no murmurs, pink, skin warm and dry Abdomen: non tender, bowel sounds positive. PEG tube tube feeding and meds Musculoskeletal: Sleeping soundly but at baseline has dystonic movements of extremities Skin: Several decubiti as described below Neurologic: Leaping soundly but at baseline CN 2-12 grossly intact definitive bilateral disconjugate gaze today,. Sensation intact both upper and lower extremities, difficult to test strength in upper extremities but appears to be 2/5 with lower extremities 2-3/5.  Attempted to briefly follow simple commands with right upper extremity. Psychiatric: Sleeping soundly but at baseline oriented times name. Happy affect   Assessment/Plan: Acute problems: Anoxic brain injury 2/2 PEA arrest:  -Presumed 2/2 combination alcohol and BZD overdose with UDS positive for THC and BZDs -Anticipate  will not recover fully from this event/recover to previous baseline therefore long-term SNF placement recommended -Continue PT/OT/SLP -Continue low-dose baclofen for dystonic movements -seems to be improving slightly but will be slow to uptitrate medication due to potential undesired side effects such as  hallucinations and oversedation  Acute hypoxemic respiratory failure/new tracheostomy tube requirement -Continue PMV training per SLP -#4 cuffless trach-capping trial at discretion of trach team -Tracheostomy tube placed on 10/3   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   -Continue Keppra-levetiracetam level ending -no further seizure activity since transitioned out of ICU setting  Dysphagia 2/2 anoxic brain injury -Once tolerating PMV can assess swallowing more thoroughly -Continue tube feeding per PEG  Goals of care:  -Palliative care was involved during this hospitalization.   -Goals of care were discussed. remains full code.  - Palliative care recommended outpatient follow-up with palliative providers.  Protein calorie malnutrition nutrition Status: Nutrition Problem: Increased nutrient needs Etiology: acute illness Signs/Symptoms: estimated needs Interventions: Tube feeding Estimated body mass index is 26.78 kg/m as calculated from the following:   Height as of this encounter: 5' 6"  (1.676 m).   Weight as of this encounter: 75.3 kg.   Multiple decubiti not POA Pressure Injury 11/13/19 Anus Stage 3 -  Full thickness tissue loss. Subcutaneous fat may be visible but bone, tendon or muscle are NOT exposed. (Active)  Date First Assessed/Time First Assessed: 11/13/19 1000   Location: Anus  Staging: Stage 3 -  Full thickness tissue loss. Subcutaneous fat may be visible but bone, tendon or muscle are NOT exposed.  Present on Admission: No    Assessments 11/17/2019  8:00 AM 12/19/2019  8:30 AM  Dressing Type None Foam - Lift dressing to assess site every shift  Dressing -- Clean;Dry;Intact  Dressing Change Frequency -- Every 5 days  Site / Wound Assessment Pink --  Peri-wound Assessment Intact Intact  Drainage Amount None None  Drainage Description -- Other (Comment)     No Linked orders to display     Wound / Incision (Open or Dehisced) 11/17/19 Puncture  Abdomen Left;Anterior;Upper G-tube insertion site  (Active)  Date First Assessed/Time First Assessed: 11/17/19 1646   Wound Type: Puncture  Location: Abdomen  Location Orientation: Left;Anterior;Upper  Wound Description (Comments): G-tube insertion site   Present on Admission: No    Assessments 11/17/2019  4:47 PM 12/20/2019  8:00 PM  Dressing Type Gauze (Comment);Tape dressing Gauze (Comment)  Dressing Changed New New  Dressing Status Clean;Dry;Intact --  Dressing Change Frequency PRN --  Site / Wound Assessment Clean;Dry;Pink Red;Friable  Peri-wound Assessment Intact Maceration  Closure None --  Drainage Amount None Scant  Drainage Description -- Purulent  Treatment Cleansed Cleansed     No Linked orders to display     Pressure Injury 12/01/19 Ankle Left;Lateral Stage 2 -  Partial thickness loss of dermis presenting as a shallow open injury with a red, pink wound bed without slough. (Active)  Date First Assessed/Time First Assessed: 12/01/19 2002   Location: Ankle  Location Orientation: Left;Lateral  Staging: Stage 2 -  Partial thickness loss of dermis presenting as a shallow open injury with a red, pink wound bed without slough.    Assessments 12/01/2019  7:52 PM 12/20/2019  9:00 AM  Dressing Type Foam - Lift dressing to assess site every shift --  Dressing Clean;Dry;Intact --  State of Healing -- Other (Comment)  Site / Wound Assessment Pink --  Peri-wound Assessment Intact --  Margins Unattached edges (unapproximated) --  Drainage Amount Scant --  Drainage Description Serosanguineous --  Treatment Cleansed --     No Linked orders to display      Other problems: Hypertension/grade 1 diastolic dysfunction:  -Blood pressure stable on amlodipine  -Echocardiogram this admission with preserved LVEF with evidence of grade 1 diastolic dysfunction. No physiology currently consistent with heart failure  HIV:  -CD4 count of 124.  -Dc'd Tivicay and Descovy infavor of Biktarvy to  for eventual dc to SNF -Continue prophylactic Bactrim  Inguinal hernias -Evaluated by general surgery this admission. Documented as chronically incarcerated. Patient has been clinically stable and tolerating tube feedings and having bowel movements so unless patient obstructed would not be considered a candidate for repair. Even if obstructed consulting surgeon was not sure he would be a candidate for hernia repair regardless .   Data Reviewed: Basic Metabolic Panel: No results for input(s): NA, K, CL, CO2, GLUCOSE, BUN, CREATININE, CALCIUM, MG, PHOS in the last 168 hours. Liver Function Tests: No results for input(s): AST, ALT, ALKPHOS, BILITOT, PROT, ALBUMIN in the last 168 hours. No results for input(s): LIPASE, AMYLASE in the last 168 hours. No results for input(s): AMMONIA in the last 168 hours. CBC: No results for input(s): WBC, NEUTROABS, HGB, HCT, MCV, PLT in the last 168 hours. Cardiac Enzymes: No results for input(s): CKTOTAL, CKMB, CKMBINDEX, TROPONINI in the last 168 hours. BNP (last 3 results) No results for input(s): BNP in the last 8760 hours.  ProBNP (last 3 results) No results for input(s): PROBNP in the last 8760 hours.  CBG: Recent Labs  Lab 12/20/19 1640 12/20/19 1955 12/20/19 2303 12/21/19 0316 12/21/19 0745  GLUCAP 95 111* 116* 117* 121*    No results found for this or any previous visit (from the past 240 hour(s)).   Studies: No results found.  Scheduled Meds: . amLODipine  10 mg Per Tube Daily  . baclofen  5 mg Per Tube TID  . chlorhexidine gluconate (MEDLINE KIT)  15 mL Mouth Rinse BID  . Chlorhexidine Gluconate Cloth  6 each Topical Q0600  . Darunavir-Cobicisctat-Emtricitabine-Tenofovir Alafenamide  1 tablet Oral Q breakfast  . feeding supplement (PROSource TF)  45 mL Per Tube TID  . folic acid  1 mg Per Tube Daily  . free water  200 mL Per Tube Q4H  . heparin injection (subcutaneous)  5,000 Units Subcutaneous Q8H  . levETIRAcetam  1,500  mg Per Tube BID  . mouth rinse  15 mL Mouth Rinse 10 times per day  . nutrition supplement (JUVEN)  1 packet Per Tube BID BM  . nystatin   Topical TID  . scopolamine  1 patch Transdermal Q72H  . sulfamethoxazole-trimethoprim  1 tablet Per Tube Once per day on Mon Wed Fri  . thiamine  100 mg Per Tube Daily   Continuous Infusions: . feeding supplement (OSMOLITE 1.5 CAL) 1,000 mL (12/21/19 0230)    Active Problems:   Cardiac arrest ([place])   Community acquired pneumonia of left upper lobe of lung   Anoxic brain injury ([place])   Alcohol abuse   Acute respiratory failure with hypoxia (HCC)   Vomiting   Pressure injury of skin   Small bowel obstruction (HCC)   Palliative care encounter   Bowel obstruction (HCC)   Chronic respiratory failure (HCC)   Diastolic dysfunction   Tracheostomy dependent ([place])   Dysphagia   Protein-calorie malnutrition ([place])   Seizures ([place])   Bilateral recurrent inguinal hernia   Consultants:  PCCM  Palliative medicine  General surgery  Neurology  Interventional radiology  Procedures:  EEG  Echocardiogram  Tracheostomy  Antibiotics: Anti-infectives (From admission, onward)   Start     Dose/Rate Route Frequency Ordered Stop   12/19/19 0930  Darunavir-Cobicisctat-Emtricitabine-Tenofovir Alafenamide (SYMTUZA) 800-150-200-10 MG TABS 1 tablet        1 tablet Oral Daily with breakfast 12/19/19 0840     12/13/19 1000  bictegravir-emtricitabine-tenofovir AF (BIKTARVY) 50-200-25 MG per tablet 1 tablet  Status:  Discontinued        1 tablet Oral Daily 12/12/19 1413 12/19/19 0840   11/17/19 1548  ceFAZolin (ANCEF) 2-4 GM/100ML-% IVPB       Note to Pharmacy: Domenick Bookbinder   : cabinet override      11/17/19 1548 11/18/19 0359   11/16/19 1515  ceFAZolin (ANCEF) IVPB 2g/100 mL premix        2 g 200 mL/hr over 30 Minutes Intravenous To Radiology 11/16/19 1511 11/17/19 1625   11/15/19 0900  sulfamethoxazole-trimethoprim (BACTRIM DS) 800-160 MG per  tablet 1 tablet        1 tablet Per Tube Once per day on Mon Wed Fri 11/13/19 1906     11/13/19 1615  dolutegravir (TIVICAY) tablet 50 mg  Status:  Discontinued        50 mg Oral Daily 11/13/19 1521 12/12/19 1413   11/13/19 1615  emtricitabine-tenofovir AF (DESCOVY) 200-25 MG per tablet 1 tablet  Status:  Discontinued        1 tablet Per Tube Daily 11/13/19 1521 12/12/19 1413   11/13/19 1530  sulfamethoxazole-trimethoprim (BACTRIM DS) 800-160 MG per tablet 1 tablet  Status:  Discontinued        1 tablet Oral Once per day on Mon Wed Fri 11/13/19 1441 11/13/19 1906   11/13/19 1500  bictegravir-emtricitabine-tenofovir AF (BIKTARVY) 50-200-25 MG per tablet 1 tablet  Status:  Discontinued        1 tablet Oral Daily 11/13/19 1403 11/13/19 1521   11/10/19 1000  erythromycin 250 mg in sodium chloride 0.9 % 100 mL IVPB        250 mg 100 mL/hr over 60 Minutes Intravenous Every 8 hours 11/10/19 0907 11/11/19 2000   11/07/19 1200  ceFEPIme (MAXIPIME) 2 g in sodium chloride 0.9 % 100 mL IVPB        2 g 200 mL/hr over 30 Minutes Intravenous Every 8 hours 11/07/19 1013 11/13/19 2127   11/03/19 1200  cefTRIAXone (ROCEPHIN) 2 g in sodium chloride 0.9 % 100 mL IVPB        2 g 200 mL/hr over 30 Minutes Intravenous Every 24 hours 11/03/19 0922 11/05/19 1427   11/02/19 0800  vancomycin (VANCOREADY) IVPB 750 mg/150 mL  Status:  Discontinued        750 mg 150 mL/hr over 60 Minutes Intravenous Every 12 hours 11/01/19 1935 11/02/19 1105   11/02/19 0600  piperacillin-tazobactam (ZOSYN) IVPB 3.375 g  Status:  Discontinued        3.375 g 12.5 mL/hr over 240 Minutes Intravenous Every 8 hours 11/02/19 0311 11/03/19 0920   11/01/19 2200  ceFEPIme (MAXIPIME) 2 g in sodium chloride 0.9 % 100 mL IVPB  Status:  Discontinued        2 g 200 mL/hr over 30 Minutes Intravenous Every 12 hours 11/01/19 2143 11/02/19 0311   11/01/19 1915  vancomycin (VANCOREADY) IVPB 1500 mg/300 mL        1,500 mg 150 mL/hr over 120 Minutes  Intravenous  Once 11/01/19 1909 11/01/19 2147  11/01/19 1845  cefTRIAXone (ROCEPHIN) 1 g in sodium chloride 0.9 % 100 mL IVPB        1 g 200 mL/hr over 30 Minutes Intravenous  Once 11/01/19 1842 11/01/19 2051   11/01/19 1845  azithromycin (ZITHROMAX) 500 mg in sodium chloride 0.9 % 250 mL IVPB        500 mg 250 mL/hr over 60 Minutes Intravenous  Once 11/01/19 1842 11/01/19 2051       Time spent: 10 minutes    Erin Hearing ANP  [facility] Pager [phone]. If 7PM-7AM, please contact night-coverage at [email or web address] 12/21/2019, 10:59 AM  LOS: 50 days

## 2019-12-21 NOTE — Progress Notes (Signed)
  Speech Language Pathology Treatment: Cognitive-Linquistic;Passy Muir Speaking valve  Patient Details Name: Ryan Orozco MRN: [id number] DOB: 06-01-1965 Today's Date: 12/21/2019 Time: 0962-8366 SLP Time Calculation (min) (ACUTE ONLY): 27 min  Assessment / Plan / Recommendation Clinical Impression  ST and PT collaborated today to maximize function. On arrival it was apparent that he was in pain/distress due to restlessness, increased frequency and tone of vocalizations and furrowed brow. Sat on edge of bed which appeared to alleviate some discomfort but continued to move and was unable to sustain focus on therapist today. He is vocalizing around the trach and when valve donned therapist attempted imitation of sounds which he was unable to attend to. Vital signs stable with valve. SLP will continue treatment to move toward purposeful communication if able to demonstrate progress. Therapist will downgrade time to minimum of one time/week.   HPI HPI: 54 y/o male with hx of HIV, ETOH abuse found down outside a convenience store on 9/27, received out of hospital CPR. Presents with acute hypoxic respiratory failure s/p cardiac arrest, acute encephalopathy due to hypoxic brain injury. Intubated 9/27-10/13. Received a tracheostomy 10/13, treachestomy collar trial for 9 hours on 10/16. CXR on 10/17 improving and shows left lung opacities have essentially resolved      SLP Plan  Continue with current plan of care       Recommendations         Patient may use Passy-Muir Speech Valve: During all therapies with supervision PMSV Supervision: Full         Oral Care Recommendations: Oral care QID Follow up Recommendations: LTACH;Skilled Nursing facility SLP Visit Diagnosis: Aphonia (R49.1);Cognitive communication deficit (Q94.765) Plan: Continue with current plan of care                      Royce Macadamia 12/21/2019, 3:52 PM'  Darrow Bussing.Ed  Nurse, children's [phone] Office [phone]

## 2019-12-21 NOTE — Progress Notes (Signed)
Physical Therapy Treatment Patient Details Name: Ryan Orozco MRN: [id number] DOB: 13-Dec-1965 Today's Date: 12/21/2019    History of Present Illness 54 year old male found down outside convenience store 9/27. Pt with PEA and hypoxic brain injury. Trach 10/13, PEG 10/15. PMHx: HIV AIDS and alcohol abuse    PT Comments    Pt restless upon arrival to room, noted to grimace and vocalize as if in pain. Pt unable to localize pain or irritation to PT/SLP. Pt required total assist for moving to and from EOB, and max assist to maintain upright sitting at EOB. Pt demonstrating pain signs with R hamstring stretches, pt lacking ~90* knee extension suspect due to flexor tone as well as pt guarding. PT to continue to progress pt mobility as tolerated.     Follow Up Recommendations  SNF     Equipment Recommendations  Wheelchair (measurements PT);Wheelchair cushion (measurements PT);Hospital bed;Other (comment)    Recommendations for Other Services       Precautions / Restrictions Precautions Precautions: Fall Precaution Comments: trach, PEG, anoxic, flexor tone UE and  LE    Mobility  Bed Mobility Overal bed mobility: Needs Assistance Bed Mobility: Rolling;Sidelying to Sit;Sit to Sidelying Rolling: Total assist Sidelying to sit: Total assist     Sit to sidelying: Total assist General bed mobility comments: total assist for all aspects, pt resistant and posteriorly leaning with PT facilitation  Transfers                 General transfer comment: unable  Ambulation/Gait                 Stairs             Wheelchair Mobility    Modified Rankin (Stroke Patients Only)       Balance Overall balance assessment: Needs assistance Sitting-balance support: Feet supported;Bilateral upper extremity supported Sitting balance-Leahy Scale: Zero Sitting balance - Comments: EOB sitting x10 minutes, HOB elevated completely as a barrier to avoid returning to  supine. max PT assist for posterior and R lateral lean support Postural control: Posterior lean;Right lateral lean                                  Cognition Arousal/Alertness: Awake/alert Behavior During Therapy: Flat affect Overall Cognitive Status: Impaired/Different from baseline Area of Impairment: Problem solving;Awareness;Safety/judgement;Following commands;Memory;Attention               Rancho Levels of Cognitive Functioning Rancho Los Amigos Scales of Cognitive Functioning: Confused/inappropriate/non-agitated   Current Attention Level: Focused Memory: Decreased short-term memory Following Commands: Follows one step commands inconsistently;Follows one step commands with increased time Safety/Judgement: Decreased awareness of deficits Awareness: Intellectual Problem Solving: Slow processing;Requires tactile cues;Requires verbal cues General Comments: no command following noted today, pt does respond "yeah" when PT states pt name      Exercises General Exercises - Lower Extremity Heel Slides: PROM;Both;5 reps;Supine    General Comments        Pertinent Vitals/Pain Pain Assessment: Faces Faces Pain Scale: Hurts little more Pain Location: generalized Pain Descriptors / Indicators: Grimacing;Guarding Pain Intervention(s): Limited activity within patient's tolerance;Monitored during session;Repositioned    Home Living                      Prior Function            PT Goals (current goals can now be found in the care plan section)  Acute Rehab PT Goals Patient Stated Goal: none stated PT Goal Formulation: With patient Time For Goal Achievement: 01/04/20 Potential to Achieve Goals: Fair Progress towards PT goals: Progressing toward goals    Frequency    Min 2X/week      PT Plan Current plan remains appropriate    Co-evaluation              AM-PAC PT "6 Clicks" Mobility   Outcome Measure  Help needed turning from your  back to your side while in a flat bed without using bedrails?: Total Help needed moving from lying on your back to sitting on the side of a flat bed without using bedrails?: Total Help needed moving to and from a bed to a chair (including a wheelchair)?: Total Help needed standing up from a chair using your arms (e.g., wheelchair or bedside chair)?: Total Help needed to walk in hospital room?: Total Help needed climbing 3-5 steps with a railing? : Total 6 Click Score: 6    End of Session Equipment Utilized During Treatment: Oxygen Activity Tolerance: Patient tolerated treatment well Patient left: in bed;with call bell/phone within reach;with bed alarm set Nurse Communication: Mobility status (repositioning for skin protection) PT Visit Diagnosis: Other abnormalities of gait and mobility (R26.89);Muscle weakness (generalized) (M62.81)     Time: 9563-8756 PT Time Calculation (min) (ACUTE ONLY): 24 min  Charges:  $Neuromuscular Re-education: 8-22 mins                     [name] E, PT Acute Rehabilitation Services Pager [phone]  Office [phone]    [name] D [name] 12/21/2019, 3:56 PM

## 2019-12-22 DIAGNOSIS — E440 Moderate protein-calorie malnutrition: Secondary | ICD-10-CM

## 2019-12-22 LAB — GLUCOSE, CAPILLARY
Glucose-Capillary: 120 mg/dL — ABNORMAL HIGH (ref 70–99)
Glucose-Capillary: 108 mg/dL — ABNORMAL HIGH (ref 70–99)
Glucose-Capillary: 112 mg/dL — ABNORMAL HIGH (ref 70–99)
Glucose-Capillary: 101 mg/dL — ABNORMAL HIGH (ref 70–99)
Glucose-Capillary: 79 mg/dL (ref 70–99)

## 2019-12-22 MED ORDER — BACLOFEN 10 MG PO TABS
10.0000 mg | ORAL_TABLET | Freq: Three times a day (TID) | ORAL | Status: DC
  Administered 2019-12-22 – 2020-01-02 (×34): 10 mg
  Filled 2019-12-22 (×34): qty 1

## 2019-12-22 MED ORDER — ACETAMINOPHEN 160 MG/5ML PO SOLN
650.0000 mg | Freq: Four times a day (QID) | ORAL | Status: DC
  Administered 2019-12-22 – 2020-04-08 (×426): 650 mg
  Filled 2019-12-22 (×421): qty 20.3

## 2019-12-22 NOTE — TOC Progression Note (Signed)
Transition of Care [facility]) - Progression Note    Patient Details  Name: Ryan Orozco MRN: [id number] Date of Birth: 1965-05-29  [facility]) CM/SW Contact  Beckie Busing, RN Phone Number: [phone]  12/22/2019, 10:49 AM  Clinical Narrative:    Currently no bed offers. Bed search has been extended. TOC continues to follow. Will update noted after follow up calls on faxed info.    Expected Discharge Plan: Long Term Acute Care (LTAC) Barriers to Discharge: Continued Medical Work up  Expected Discharge Plan and Services Expected Discharge Plan: Long Term Acute Care (LTAC)   Discharge Planning Services: CM Consult   Living arrangements for the past 2 months: Single Family Home                                       Social Determinants of Health (SDOH) Interventions    Readmission Risk Interventions No flowsheet data found.

## 2019-12-22 NOTE — Progress Notes (Signed)
[facility] PROGRESS NOTE  Junious [place] MSX:[id number] DOB: April 08, 1965 DOA: 11/01/2019 PCP: Default, Provider, MD    11/16   Status: Remains inpatient appropriate because:Altered mental status, Unsafe d/c plan and Inpatient level of care appropriate due to severity of illness. Patient newly diagnosed with anoxic brain injury  Dispo: The patient is from: Home              Anticipated d/c is to: SNF              Anticipated d/c date is: > 3 days              Patient currently is not medically stable to d/c.  PMV training in process. Medicaid application in process. Patient will also need to have disability paperwork completed since do not expect full recovery.  Trach Tube has been in place greater than 30 days.   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  EMS found him pulseless in PEA, unknown downtime, successfully resuscitated and brought to the emergency department.  UDS positive for THC/benzodiazepines.  CT head unremarkable.  Failed extubation trials due to severe anoxic brain injury and had tracheostomy placed.  PEG placed on 10/15.  Now the plan is for placement in a skilled nursing facility/LTAC.  Prolonged hospital course.  Difficult placement. TOC following  Significant Hospital Events   9/29 Admit post PEA arrest. UDS positive for THC, benzo's. ETOH 177 10/05 EEG ongoing. Versed restarted overnight due to agitation. Nicardipine increased.  10/06 Developed tachypnea with WUA, no follow commands off sedation  10/07 Vomiting, TF held / restarted with recurrent vomiting 10/16 tracheostomy collar for 9  hours 10/18->10/19 off vent all night  10/20> TC 11/15 > downsized to #4 Shiley flex CFS  Subjective: Appears to smile appropriately.  Quite awake.  Appears uncomfortable with mild grimacing during movement of upper extremities.  Objective: Vitals:   12/22/19 0539 12/22/19 0815  BP: 113/74   Pulse: 86 98  Resp:  18  Temp: 98.6 F (37 C)   SpO2: 100% 100%    Intake/Output Summary (Last 24 hours) at 12/22/2019 1114 Last data filed at 12/22/2019 0601 Gross per 24 hour  Intake 1291 ml  Output --  Net 1291 ml   Filed Weights   12/20/19 0106 12/20/19 2101 12/21/19 0148  Weight: 74.2 kg 75.3 kg 75.3 kg    Exam: Constitutional: NAD, calm, appears uncomfortable when moving upper extremities Respiratory: Clear to auscultation bilaterally upon anterior auscultation, #4 cuffless trach with 21% FiO2/TC; no tracheal secretions noted Cardiovascular: Regular rate and rhythm, no murmurs, pink, skin warm and dry Abdomen: non tender, bowel sounds positive. PEG tube Musculoskeletal: Continue to have upper extremity dystonic movements with these movements appearing to cause discomfort Skin: Several decubiti as described below Neurologic: CN 2-12 grossly intact definitive bilateral disconjugate gaze today,. Sensation intact both upper and lower extremities, difficult to test strength in upper extremities but appears to be 2/5 with lower extremities 2-3/5.  Attempted to briefly follow simple commands with right upper extremity. Psychiatric:  oriented times name. Happy affect   Assessment/Plan: Acute problems: Anoxic brain injury 2/2 PEA arrest:  -Presumed 2/2 combination alcohol and BZD overdose with UDS positive  for THC and BZDs -Anticipate will not recover fully from this event/recover to previous baseline therefore long-term SNF placement recommended -Continue PT/OT/SLP -Dystonic movements improved somewhat but still persist therefore will increase baclofen to 10 mg TID -Scheduled  Tylenol for any pain associated with dystonic movements  Acute hypoxemic respiratory failure/new tracheostomy tube requirement -Continue PMV training per SLP -#4 cuffless trach-capping trial at discretion of trach team -Tracheostomy tube placed on 10/3   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   -Continue Keppra-levetiracetam level 35.9 on 11/12 -no further seizure activity since transitioned out of ICU setting  Dysphagia 2/2 anoxic brain injury -Once tolerating PMV can assess swallowing more thoroughly -Continue tube feeding per PEG  Goals of care:  -Palliative care was involved during this hospitalization.   -Goals of care were discussed. remains full code.  - Palliative care recommended outpatient follow-up with palliative providers.  Protein calorie malnutrition nutrition Status: Nutrition Problem: Increased nutrient needs Etiology: acute illness Signs/Symptoms: estimated needs Interventions: Tube feeding Estimated body mass index is 26.78 kg/m as calculated from the following:   Height as of this encounter: 5' 6"  (1.676 m).   Weight as of this encounter: 75.3 kg.   Multiple decubiti not POA Pressure Injury 11/13/19 Anus Stage 3 -  Full thickness tissue loss. Subcutaneous fat may be visible but bone, tendon or muscle are NOT exposed. (Active)  Date First Assessed/Time First Assessed: 11/13/19 1000   Location: Anus  Staging: Stage 3 -  Full thickness tissue loss. Subcutaneous fat may be visible but bone, tendon or muscle are NOT exposed.  Present on Admission: No    Assessments 11/17/2019  8:00 AM 12/19/2019  8:30 AM  Dressing Type None Foam - Lift dressing to assess site every shift  Dressing -- Clean;Dry;Intact  Dressing Change Frequency -- Every 5 days  Site / Wound Assessment Pink --  Peri-wound Assessment Intact Intact  Drainage Amount None None  Drainage Description -- Other (Comment)     No Linked orders to display     Wound / Incision (Open or  Dehisced) 11/17/19 Puncture Abdomen Left;Anterior;Upper G-tube insertion site  (Active)  Date First Assessed/Time First Assessed: 11/17/19 1646   Wound Type: Puncture  Location: Abdomen  Location Orientation: Left;Anterior;Upper  Wound Description (Comments): G-tube insertion site   Present on Admission: No    Assessments 11/17/2019  4:47 PM 12/21/2019  8:00 PM  Dressing Type Gauze (Comment);Tape dressing Gauze (Comment)  Dressing Changed New Changed  Dressing Status Clean;Dry;Intact New drainage  Dressing Change Frequency PRN --  Site / Wound Assessment Clean;Dry;Pink Red;Friable  Peri-wound Assessment Intact Maceration  Closure None --  Drainage Amount None Scant  Drainage Description -- Purulent;No odor  Treatment Cleansed Cleansed     No Linked orders to display     Pressure Injury 12/01/19 Ankle Left;Lateral Stage 2 -  Partial thickness loss of dermis presenting as a shallow open injury with a red, pink wound bed without slough. (Active)  Date First Assessed/Time First Assessed: 12/01/19 2002   Location: Ankle  Location Orientation: Left;Lateral  Staging: Stage 2 -  Partial thickness loss of dermis presenting as a shallow open injury with a red, pink wound bed without slough.    Assessments 12/01/2019  7:52 PM 12/20/2019  9:00 AM  Dressing Type Foam - Lift dressing to assess site every shift --  Dressing Clean;Dry;Intact --  State of Healing -- Other (Comment)  Site / Wound Assessment Pink --  Peri-wound Assessment Intact --  Margins  Unattached edges (unapproximated) --  Drainage Amount Scant --  Drainage Description Serosanguineous --  Treatment Cleansed --     No Linked orders to display      Other problems: Hypertension/grade 1 diastolic dysfunction:  -Blood pressure stable on amlodipine  -Echocardiogram this admission with preserved LVEF with evidence of grade 1 diastolic dysfunction. No physiology currently consistent with heart failure  HIV:  -CD4 count of 124.   -Dc'd Tivicay and [place] infavor of Biktarvy to for eventual dc to SNF -Continue prophylactic Bactrim  Inguinal hernias -Evaluated by general surgery this admission. Documented as chronically incarcerated. Patient has been clinically stable and tolerating tube feedings and having bowel movements so unless patient obstructed would not be considered a candidate for repair. Even if obstructed consulting surgeon was not sure he would be a candidate for hernia repair regardless .   Data Reviewed: Basic Metabolic Panel: No results for input(s): NA, K, CL, CO2, GLUCOSE, BUN, CREATININE, CALCIUM, MG, PHOS in the last 168 hours. Liver Function Tests: No results for input(s): AST, ALT, ALKPHOS, BILITOT, PROT, ALBUMIN in the last 168 hours. No results for input(s): LIPASE, AMYLASE in the last 168 hours. No results for input(s): AMMONIA in the last 168 hours. CBC: No results for input(s): WBC, NEUTROABS, HGB, HCT, MCV, PLT in the last 168 hours. Cardiac Enzymes: No results for input(s): CKTOTAL, CKMB, CKMBINDEX, TROPONINI in the last 168 hours. BNP (last 3 results) No results for input(s): BNP in the last 8760 hours.  ProBNP (last 3 results) No results for input(s): PROBNP in the last 8760 hours.  CBG: Recent Labs  Lab 12/21/19 1712 12/21/19 2043 12/21/19 2319 12/22/19 0341 12/22/19 0735  GLUCAP 107* 89 123* 120* 108*    No results found for this or any previous visit (from the past 240 hour(s)).   Studies: No results found.  Scheduled Meds: . acetaminophen (TYLENOL) oral liquid 160 mg/5 mL  650 mg Per Tube Q6H  . amLODipine  10 mg Per Tube Daily  . baclofen  10 mg Per Tube TID  . chlorhexidine gluconate (MEDLINE KIT)  15 mL Mouth Rinse BID  . Chlorhexidine Gluconate Cloth  6 each Topical Q0600  . Darunavir-Cobicisctat-Emtricitabine-Tenofovir Alafenamide  1 tablet Oral Q breakfast  . feeding supplement (PROSource TF)  45 mL Per Tube TID  . folic acid  1 mg Per Tube Daily  .  free water  200 mL Per Tube Q4H  . heparin injection (subcutaneous)  5,000 Units Subcutaneous Q8H  . levETIRAcetam  1,500 mg Per Tube BID  . mouth rinse  15 mL Mouth Rinse 10 times per day  . nutrition supplement (JUVEN)  1 packet Per Tube BID BM  . nystatin   Topical TID  . scopolamine  1 patch Transdermal Q72H  . sulfamethoxazole-trimethoprim  1 tablet Per Tube Once per day on Mon Wed Fri  . thiamine  100 mg Per Tube Daily   Continuous Infusions: . feeding supplement (OSMOLITE 1.5 CAL) 1,000 mL (12/21/19 2139)    Active Problems:   Cardiac arrest [facility])   Community acquired pneumonia of left upper lobe of lung   Anoxic brain injury ([place])   Alcohol abuse   Acute respiratory failure with hypoxia ([facility])   Vomiting   Pressure injury of skin   Small bowel obstruction ([facility])   Palliative care encounter   Bowel obstruction ([facility])   Chronic respiratory failure ([facility])   Diastolic dysfunction   Tracheostomy dependent ([place])   Dysphagia   Protein-calorie malnutrition (  [place])   Seizures ([place])   Bilateral recurrent inguinal hernia   Consultants:  PCCM  Palliative medicine  General surgery  Neurology  Interventional radiology  Procedures:  EEG  Echocardiogram  Tracheostomy  Antibiotics: Anti-infectives (From admission, onward)   Start     Dose/Rate Route Frequency Ordered Stop   12/19/19 0930  Darunavir-Cobicisctat-Emtricitabine-Tenofovir Alafenamide (SYMTUZA) 800-150-200-10 MG TABS 1 tablet        1 tablet Oral Daily with breakfast 12/19/19 0840     12/13/19 1000  bictegravir-emtricitabine-tenofovir AF (BIKTARVY) 50-200-25 MG per tablet 1 tablet  Status:  Discontinued        1 tablet Oral Daily 12/12/19 1413 12/19/19 0840   11/17/19 1548  ceFAZolin (ANCEF) 2-4 GM/100ML-% IVPB       Note to Pharmacy: Domenick Bookbinder   : cabinet override      11/17/19 1548 11/18/19 0359   11/16/19 1515  ceFAZolin (ANCEF) IVPB 2g/100 mL premix        2 g 200 mL/hr over 30 Minutes  Intravenous To Radiology 11/16/19 1511 11/17/19 1625   11/15/19 0900  sulfamethoxazole-trimethoprim (BACTRIM DS) 800-160 MG per tablet 1 tablet        1 tablet Per Tube Once per day on Mon Wed Fri 11/13/19 1906     11/13/19 1615  dolutegravir (TIVICAY) tablet 50 mg  Status:  Discontinued        50 mg Oral Daily 11/13/19 1521 12/12/19 1413   11/13/19 1615  emtricitabine-tenofovir AF (DESCOVY) 200-25 MG per tablet 1 tablet  Status:  Discontinued        1 tablet Per Tube Daily 11/13/19 1521 12/12/19 1413   11/13/19 1530  sulfamethoxazole-trimethoprim (BACTRIM DS) 800-160 MG per tablet 1 tablet  Status:  Discontinued        1 tablet Oral Once per day on Mon Wed Fri 11/13/19 1441 11/13/19 1906   11/13/19 1500  bictegravir-emtricitabine-tenofovir AF (BIKTARVY) 50-200-25 MG per tablet 1 tablet  Status:  Discontinued        1 tablet Oral Daily 11/13/19 1403 11/13/19 1521   11/10/19 1000  erythromycin 250 mg in sodium chloride 0.9 % 100 mL IVPB        250 mg 100 mL/hr over 60 Minutes Intravenous Every 8 hours 11/10/19 0907 11/11/19 2000   11/07/19 1200  ceFEPIme (MAXIPIME) 2 g in sodium chloride 0.9 % 100 mL IVPB        2 g 200 mL/hr over 30 Minutes Intravenous Every 8 hours 11/07/19 1013 11/13/19 2127   11/03/19 1200  cefTRIAXone (ROCEPHIN) 2 g in sodium chloride 0.9 % 100 mL IVPB        2 g 200 mL/hr over 30 Minutes Intravenous Every 24 hours 11/03/19 0922 11/05/19 1427   11/02/19 0800  vancomycin (VANCOREADY) IVPB 750 mg/150 mL  Status:  Discontinued        750 mg 150 mL/hr over 60 Minutes Intravenous Every 12 hours 11/01/19 1935 11/02/19 1105   11/02/19 0600  piperacillin-tazobactam (ZOSYN) IVPB 3.375 g  Status:  Discontinued        3.375 g 12.5 mL/hr over 240 Minutes Intravenous Every 8 hours 11/02/19 0311 11/03/19 0920   11/01/19 2200  ceFEPIme (MAXIPIME) 2 g in sodium chloride 0.9 % 100 mL IVPB  Status:  Discontinued        2 g 200 mL/hr over 30 Minutes Intravenous Every 12 hours 11/01/19  2143 11/02/19 0311   11/01/19 1915  vancomycin (VANCOREADY) IVPB 1500 mg/300 mL  1,500 mg 150 mL/hr over 120 Minutes Intravenous  Once 11/01/19 1909 11/01/19 2147   11/01/19 1845  cefTRIAXone (ROCEPHIN) 1 g in sodium chloride 0.9 % 100 mL IVPB        1 g 200 mL/hr over 30 Minutes Intravenous  Once 11/01/19 1842 11/01/19 2051   11/01/19 1845  azithromycin (ZITHROMAX) 500 mg in sodium chloride 0.9 % 250 mL IVPB        500 mg 250 mL/hr over 60 Minutes Intravenous  Once 11/01/19 1842 11/01/19 2051       Time spent: 20 minutes    Erin Hearing ANP  [facility] Pager [phone]. If 7PM-7AM, please contact night-coverage at [email or web address] 12/22/2019, 11:14 AM  LOS: 51 days

## 2019-12-22 NOTE — Plan of Care (Signed)
  Problem: Education: Goal: Knowledge of General Education information will improve Description: Including pain rating scale, medication(s)/side effects and non-pharmacologic comfort measures Outcome: Progressing   Problem: Health Behavior/Discharge Planning: Goal: Ability to manage health-related needs will improve Outcome: Progressing   Problem: Clinical Measurements: Goal: Ability to maintain clinical measurements within normal limits will improve Outcome: Progressing Goal: Will remain free from infection Outcome: Progressing Goal: Diagnostic test results will improve Outcome: Progressing Goal: Respiratory complications will improve Outcome: Progressing Goal: Cardiovascular complication will be avoided Outcome: Progressing   Problem: Activity: Goal: Risk for activity intolerance will decrease Outcome: Progressing   Problem: Nutrition: Goal: Adequate nutrition will be maintained Outcome: Progressing   Problem: Coping: Goal: Level of anxiety will decrease Outcome: Progressing   Problem: Elimination: Goal: Will not experience complications related to bowel motility Outcome: Progressing Goal: Will not experience complications related to urinary retention Outcome: Progressing   Problem: Pain Managment: Goal: General experience of comfort will improve Outcome: Progressing   Problem: Safety: Goal: Ability to remain free from injury will improve Outcome: Progressing   Problem: Skin Integrity: Goal: Risk for impaired skin integrity will decrease Outcome: Progressing   Problem: Education: Goal: 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 Outcome: Progressing   Problem: Activity: Goal: Ability to tolerate increased activity will improve Outcome: Progressing   Problem: Health Behavior/Discharge Planning: Goal: 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 Outcome: Progressing   Problem: Respiratory: Goal: Patent airway maintenance will  improve Outcome: Progressing   Problem: Role Relationship: Goal: Ability to communicate will improve Outcome: Progressing   

## 2019-12-23 LAB — GLUCOSE, CAPILLARY
Glucose-Capillary: 101 mg/dL — ABNORMAL HIGH (ref 70–99)
Glucose-Capillary: 105 mg/dL — ABNORMAL HIGH (ref 70–99)
Glucose-Capillary: 113 mg/dL — ABNORMAL HIGH (ref 70–99)
Glucose-Capillary: 120 mg/dL — ABNORMAL HIGH (ref 70–99)
Glucose-Capillary: 134 mg/dL — ABNORMAL HIGH (ref 70–99)
Glucose-Capillary: 85 mg/dL (ref 70–99)

## 2019-12-23 NOTE — Progress Notes (Signed)
Patient on long length of stay team: 54 year old male with HIV, chronic alcohol abuse who was presented to the emergency department after being found unresponsive outside a convinient store. EMS found him pulseless in PEA, unknown downtime, successfully resuscitated and brought to the emergency department. UDS positive for THC/benzodiazepines. CT head unremarkable. Failed extubation trials due to severe anoxic brain injury and had tracheostomy placed. PEG placed on 10/15. Now the plan is for placement in a skilled nursing facility/LTAC.Prolonged hospital course. Difficult placement. TOCfollowing. No overnight events.  Patient appears comfortable in bed. [name] DO

## 2019-12-24 LAB — GLUCOSE, CAPILLARY
Glucose-Capillary: 118 mg/dL — ABNORMAL HIGH (ref 70–99)
Glucose-Capillary: 121 mg/dL — ABNORMAL HIGH (ref 70–99)
Glucose-Capillary: 128 mg/dL — ABNORMAL HIGH (ref 70–99)
Glucose-Capillary: 151 mg/dL — ABNORMAL HIGH (ref 70–99)
Glucose-Capillary: 68 mg/dL — ABNORMAL LOW (ref 70–99)
Glucose-Capillary: 91 mg/dL (ref 70–99)

## 2019-12-24 NOTE — Progress Notes (Signed)
PROGRESS NOTE    Ryan Orozco  SVX:[id number]  DOB: 10-Jan-1966  PCP: Default, Provider, MD Admit date:11/01/2019 Hospital course:54 year old male with HIV, chronic alcohol abuse who was presented to the emergency department after being found unresponsive outside a convinient store. EMS found him pulseless in PEA, unknown downtime, successfully resuscitated and brought to the emergency department. UDS positive for THC/benzodiazepines. CT head unremarkable. Failed extubation trials due to severe anoxic brain injury and had tracheostomy placed. PEG placed on 10/15. Now the plan is for placement in a skilled nursing facility/LTAC.Prolonged hospital course. Difficult placement. TOCfollowing.     Subjective:  Patient seen and examined.  In no acute distress.  S/p trach and PEG.  He is awake and moving all extremities, not really communicative.  Objective: Vitals:   12/24/19 0500 12/24/19 0544 12/24/19 0801 12/24/19 1121  BP:  113/75    Pulse:  89 86 86  Resp:  18 18 18   Temp:  98.5 F (36.9 C)    TempSrc:  Oral    SpO2:  96% 96% 97%  Weight: 71.7 kg     Height:        Intake/Output Summary (Last 24 hours) at 12/24/2019 1214 Last data filed at 12/24/2019 0520 Gross per 24 hour  Intake 1200 ml  Output 1100 ml  Net 100 ml   Filed Weights   12/20/19 2101 12/21/19 0148 12/24/19 0500  Weight: 75.3 kg 75.3 kg 71.7 kg    Physical Examination:  General: Thin built male post tracheostomy/PEG tube, pleasantly confused Head ENT: Status post trach with trach collar Heart: S1-S2 heard, regular rate and rhythm, no murmurs.  No leg edema noted Lungs: Equal air entry bilaterally, no rhonchi or rales on exam, no accessory muscle use Abdomen: Status post PEG.  Bowel sounds heard, soft, nontender, nondistended.  Extremities: No pedal edema.  No cyanosis or clubbing. Neurological: Awake alert , moving all extremities, follows some commands, unable to participate in meaningful  communication.   Skin: Sacral decubiti unchanged    Data Reviewed: I have personally reviewed following labs and imaging studies  CBC: No results for input(s): WBC, NEUTROABS, HGB, HCT, MCV, PLT in the last 168 hours. Basic Metabolic Panel: No results for input(s): NA, K, CL, CO2, GLUCOSE, BUN, CREATININE, CALCIUM, MG, PHOS in the last 168 hours. GFR: CrCl cannot be calculated (Patient's most recent lab result is older than the maximum 21 days allowed.). Liver Function Tests: No results for input(s): AST, ALT, ALKPHOS, BILITOT, PROT, ALBUMIN in the last 168 hours. No results for input(s): LIPASE, AMYLASE in the last 168 hours. No results for input(s): AMMONIA in the last 168 hours. Coagulation Profile: No results for input(s): INR, PROTIME in the last 168 hours. Cardiac Enzymes: No results for input(s): CKTOTAL, CKMB, CKMBINDEX, TROPONINI in the last 168 hours. BNP (last 3 results) No results for input(s): PROBNP in the last 8760 hours. HbA1C: No results for input(s): HGBA1C in the last 72 hours. CBG: Recent Labs  Lab 12/23/19 1628 12/23/19 1952 12/24/19 0010 12/24/19 0508 12/24/19 0733  GLUCAP 134* 101* 128* 121* 68*   Lipid Profile: No results for input(s): CHOL, HDL, LDLCALC, TRIG, CHOLHDL, LDLDIRECT in the last 72 hours. Thyroid Function Tests: No results for input(s): TSH, T4TOTAL, FREET4, T3FREE, THYROIDAB in the last 72 hours. Anemia Panel: No results for input(s): VITAMINB12, FOLATE, FERRITIN, TIBC, IRON, RETICCTPCT in the last 72 hours. Sepsis Labs: No results for input(s): PROCALCITON, LATICACIDVEN in the last 168 hours.  No results found for this  or any previous visit (from the past 240 hour(s)).    Radiology Studies: No results found.    Scheduled Meds: . acetaminophen (TYLENOL) oral liquid 160 mg/5 mL  650 mg Per Tube Q6H  . amLODipine  10 mg Per Tube Daily  . baclofen  10 mg Per Tube TID  . chlorhexidine gluconate (MEDLINE KIT)  15 mL Mouth Rinse  BID  . Darunavir-Cobicisctat-Emtricitabine-Tenofovir Alafenamide  1 tablet Oral Q breakfast  . feeding supplement (PROSource TF)  45 mL Per Tube TID  . folic acid  1 mg Per Tube Daily  . free water  200 mL Per Tube Q4H  . heparin injection (subcutaneous)  5,000 Units Subcutaneous Q8H  . levETIRAcetam  1,500 mg Per Tube BID  . nutrition supplement (JUVEN)  1 packet Per Tube BID BM  . nystatin   Topical TID  . scopolamine  1 patch Transdermal Q72H  . sulfamethoxazole-trimethoprim  1 tablet Per Tube Once per day on Mon Wed Fri  . thiamine  100 mg Per Tube Daily   Continuous Infusions: . feeding supplement (OSMOLITE 1.5 CAL) 1,000 mL (12/23/19 1134)     Assessment/Plan:  Anoxic brain injury: In the setting of PEA arrest and urine drug screen positive for alcohol/benzodiazepine on presentation.  Poor prognosis, full recovery not anticipated.  PT/OT/speech therapy following and Passy-Muir valve attempts in progress.  Baclofen increased late last week and concern for dystonic movements, scheduled Tylenol for possible  unexpressed pain  Acute hypoxic respiratory failure: In the setting of above.  S/p tracheostomy since 10/3.  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  Continue Keppra  Dysphagia: In the setting of anoxic brain injury.  Now PEG/tube feed dependent.  Speech therapy following  Hypertension: Continue Norvasc  HIV: Last CD4 count 124.  Now on Biktarvy, prophylactic Bactrim  Protein calorie malnutrition: On tube feeds.  Multiple decubiti not present on admission: Continue local wound care  DVT prophylaxis:  Code Status: Full code, palliative care team following. Family / Patient Communication: Family not present at bedside Disposition Plan: SNF pending insurance Status is: Inpatient  Remains inpatient appropriate because:Unsafe d/c plan   Dispo: The patient is from:  Home              Anticipated d/c is to: SNF              Anticipated d/c date is: > 3 days              Patient currently is medically stable to d/c.          Time spent: 20 minutes     Guilford Shi, MD [facility] Pager in [place]  If 7PM-7AM, please contact night-coverage [email or web address] 12/24/2019, 12:14 PM

## 2019-12-25 LAB — GLUCOSE, CAPILLARY
Glucose-Capillary: 107 mg/dL — ABNORMAL HIGH (ref 70–99)
Glucose-Capillary: 109 mg/dL — ABNORMAL HIGH (ref 70–99)
Glucose-Capillary: 141 mg/dL — ABNORMAL HIGH (ref 70–99)
Glucose-Capillary: 143 mg/dL — ABNORMAL HIGH (ref 70–99)
Glucose-Capillary: 149 mg/dL — ABNORMAL HIGH (ref 70–99)
Glucose-Capillary: 91 mg/dL (ref 70–99)
Glucose-Capillary: 94 mg/dL (ref 70–99)

## 2019-12-25 MED ORDER — TRAMADOL HCL 50 MG PO TABS
25.0000 mg | ORAL_TABLET | Freq: Four times a day (QID) | ORAL | Status: DC | PRN
  Administered 2019-12-25 – 2019-12-26 (×2): 25 mg
  Filled 2019-12-25 (×2): qty 1

## 2019-12-25 MED ORDER — DICLOFENAC SODIUM 1 % EX GEL
4.0000 g | Freq: Four times a day (QID) | CUTANEOUS | Status: DC
  Administered 2019-12-25 – 2020-01-10 (×63): 4 g via TOPICAL
  Filled 2019-12-25: qty 100

## 2019-12-25 NOTE — Progress Notes (Signed)
NAMECarlitos Orozco, MRN:  [id number], DOB:  [birth year], LOS: 54 ADMISSION DATE:  11/01/2019, CONSULTATION DATE:  9/29 REFERRING MD:  EDP, CHIEF COMPLAINT:  Cardiac arrest   Brief History   54 y/o male with HIV found down outside a convenience store on 9/27, received out of hospital CPR.  After ICU admission has had acute encephalopathy, concern for anoxic brain injury.  Received a tracheostomy 10/13.    Past Medical History  ETOH Abuse HIV - dx 1995  Significant Hospital Events   9/29 Admit post PEA arrest.  UDS positive for THC, benzo's.  ETOH 177 10/05 EEG ongoing. Versed restarted overnight due to agitation. Nicardipine increased.  10/06 Developed tachypnea with WUA, no follow commands off sedation  10/07 Vomiting, TF held / restarted with recurrent vomiting 10/16 tracheostomy collar for 9 hours 10/18->10/19 off vent all night  10/20> TC 11/15>>#4 shiley cuffless 11/22 start capping trials Consults:  Neurology  Procedures:  ETT 9/29 >>10/13 Tracheostomy>>10/3  Significant Diagnostic Tests:  CT head 9/29 >  No evidence of acute intracranial abnormality. Moderate generalized cerebral atrophy, advanced for age. Chronic medially displaced fracture deformity of the right lamina papyracea. Mild ethmoid and maxillary sinus mucosal thickening.  CT cervical spine 9/29 > No evidence of acute fracture to the cervical spine. Partially imaged airspace disease within the imaged lung apices (extensive on the left). Clinical correlation is recommended. 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  EEG 9/29 > Patient was noted to have frequent episodes of axial jerking with eye opening. Concomitant EEG showed generalized polyspikes consistent with myoclonic seizures. EEG also showed continuous generalized background suppression. EEG was not reactive to tactile stimulation. Hyperventilation and photic stimulation  were not performed  ECHO 9/30 > No evidence of wall motion abnormality  MRI 10/3 > Mild DWI hyperintensity involving the caudate and possibly putamen bilaterally. Additionally, possible subtle FLAIR hyperintensity involving the cerebellum, cortex diffusely, and bilateral basal ganglia. Although not diagnostic, these findings could be seen with early hypoxic/ischemic brain injury in this patient status post PEA arrest.   EEG 10/08> severe diffuse encephalopathy likely related to anoxic/hypoxic brain injury  Testicular US 10/10> showed large left hydrocele and left inguinal hernia. No evidence of testicular mass.  MRI brain 10/11: evolving hypoxic, anoxic brain injury compared to previous MRI   CT abdomen pelvis 10/12: Right inguinal hernia and partial small bowel obstruction, large left inguinal hernia  Abdominal x-ray: Small bowel obstruction  Micro Data:  BCx2 9/29 >> negative  Urine 9/29 >> UA few bacteria, WBC, RBC, CaOxalate, and Mucus Covid, Flu A/B 9/29 >> negative Tracheal aspirate 10/5 >> normal flora   Antimicrobials:  Vancomycin 9/29 >> 9/30 Cefepime 9/29 >> 9/30 Cefepime 10/5 >> 10/11  Interim history/subjective:  Lying in bed and interactive with spontaneous movement Mentation consistent with significant anoxic injury Continues to tolerate ATC well  Objective   Blood pressure 99/61, pulse 73, temperature 98 F (36.7 C), temperature source Oral, resp. rate 18, height 5\' 6"  (1.676 m), weight 71.7 kg, SpO2 100 %.    FiO2 (%):  [21 %] 21 %   Intake/Output Summary (Last 24 hours) at 12/25/2019 0725 Last data filed at 12/25/2019 12/27/2019 Gross per 24 hour  Intake 60 ml  Output 600 ml  Net -540 ml   Filed Weights   12/20/19 2101 12/21/19 0148 12/24/19 0500  Weight: 75.3 kg 75.3 kg 71.7 kg   Physical Exam: General:  Encephalopathic middle aged male lying in bed in NAD  HEENT: #4 shiley uncuffed trach midline , MM pink/moist, PERRL,  Neuro: Alert and  interactive but encephalopathic with known anoxic injury   CV: s1s2 regular rate and rhythm, no murmur, rubs, or gallops,  PULM: Clear to auscultation bilaterally, no tracheal secretions, no cough GI: soft, bowel sounds active in all 4 quadrants, non-tender, non-distended Extremities: warm/dry, no edema  Skin: no rashes or lesions  Resolved Hospital Problem list   Partial small bowel obstruction > resolved  Assessment & Plan:  54 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 For likely rest of life SLP continues to follow for PMV trials   Rest of patient's chronic and acute medical conditions managed by primary  PCCM will continue to follow for weekly for trach care. If not cap candidate week of 11/29, would plan to sign off.   Critical care time: n/a minutes

## 2019-12-25 NOTE — Plan of Care (Signed)
  Problem: Clinical Measurements: Goal: Ability to maintain clinical measurements within normal limits will improve Outcome: Progressing Goal: Will remain free from infection Outcome: Progressing Goal: Diagnostic test results will improve Outcome: Progressing Goal: Cardiovascular complication will be avoided Outcome: Progressing   Problem: Pain Managment: Goal: General experience of comfort will improve Outcome: Progressing   Problem: Education: Goal: Knowledge of General Education information will improve Description: Including pain rating scale, medication(s)/side effects and non-pharmacologic comfort measures Outcome: Not Progressing   Problem: Health Behavior/Discharge Planning: Goal: Ability to manage health-related needs will improve Outcome: Not Progressing   Problem: Clinical Measurements: Goal: Respiratory complications will improve Outcome: Not Progressing   Problem: Activity: Goal: Risk for activity intolerance will decrease Outcome: Not Progressing   Problem: Nutrition: Goal: Adequate nutrition will be maintained Outcome: Not Progressing   Problem: Coping: Goal: Level of anxiety will decrease Outcome: Not Progressing   Problem: Elimination: Goal: Will not experience complications related to bowel motility Outcome: Not Progressing Goal: Will not experience complications related to urinary retention Outcome: Not Progressing   Problem: Safety: Goal: Ability to remain free from injury will improve Outcome: Not Progressing   Problem: Skin Integrity: Goal: Risk for impaired skin integrity will decrease Outcome: Not Progressing   Problem: Education: Goal: 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 Outcome: Not Progressing   Problem: Activity: Goal: Ability to tolerate increased activity will improve Outcome: Not Progressing   Problem: Health Behavior/Discharge Planning: Goal: 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 Outcome: Not  Progressing   Problem: Respiratory: Goal: Patent airway maintenance will improve Outcome: Not Progressing   Problem: Role Relationship: Goal: Ability to communicate will improve Outcome: Not Progressing

## 2019-12-25 NOTE — Progress Notes (Addendum)
Patient on Long LOS team and seen by NP Miss Revonda Standard today.  54 year old male with HIV, chronic alcohol abuse who was presented to the emergency department after being found unresponsive outside a convinient store. EMS found him pulseless in PEA, unknown downtime, successfully resuscitated and brought to the emergency department. UDS positive for THC/benzodiazepines. CT head unremarkable. Failed extubation trials due to severe anoxic brain injury and had tracheostomy placed. PEG placed on 10/15. Now the plan is for placement in a skilled nursing facility/LTAC.Prolonged hospital course. Difficult placement. TOCfollowing.

## 2019-12-25 NOTE — Progress Notes (Signed)
Nutrition Follow-up  DOCUMENTATION CODES:   Not applicable  INTERVENTION:  Continue tube feeds via PEG: -Osmolite 1.5@ 49m/hr (1440 ml/day) - ProSourceTF 45 mlTID - Juven BID -Free waterflushes of200 mlq 4hours  Tube feeding regimen provides 2470 kcals, 128 grams protein,10915mfree water ([id number] flushes).   NUTRITION DIAGNOSIS:   Increased nutrient needs related to acute illness as evidenced by estimated needs.  ongoing  GOAL:   Patient will meet greater than or equal to 90% of their needs  Met with TF  MONITOR:   Labs, Weight trends, TF tolerance, Skin, I & O's  REASON FOR ASSESSMENT:   Ventilator, Consult Enteral/tube feeding initiation and management  ASSESSMENT:   Patient with PMH significant for HIV and ETOH use. Presents this admission with cardiac arrest and suspected aspiration in setting of heavy ETOH use.  10/07- vomiting, TF held, laterrestarted with recurrent vomiting 10/12-R/L inguinal hernia, partial SBO  10/13- trach 10/15-PEG 10/18- ATC  Pt sleeping at time of RD visit. Pt remains on ATC and continues to receive TF via PEG. RN reports pt tolerating TF well. Current TF: Osmolite 1.5 @ 60 ml/hr, ProSource TF 45 ml TID, free water flushes of 200 ml q 4 hours  Per MD, pt with poor prognosis and full recovery is not anticipated.   Admit wt: 78.9 kg Current wt: 71.7 kg   UOP: 600m93m4 hours  Labs reviewed. Medications: folvite, juven BID, thiamine  Diet Order:   Diet Order            Diet NPO time specified  Diet effective midnight                 EDUCATION NEEDS:   Not appropriate for education at this time  Skin:  Skin Assessment: Skin Integrity Issues: Skin Integrity Issues:: Stage II, Stage III, Other (Comment) Stage II: L ankle Stage III: anus Other: puncture abdomen (PEG insertion site)  Last BM:  11/20  Height:   Ht Readings from Last 1 Encounters:  11/01/19 5' 6"  (1.676 m)     Weight:   Wt Readings from Last 1 Encounters:  12/24/19 71.7 kg    BMI:  Body mass index is 25.5 kg/m.  Estimated Nutritional Needs:   Kcal:  [id number]:  115-130 g  Fluid:  >/= 2 L/day    AmanLarkin Ina, RD, LDN RD pager number and weekend/on-call pager number located in [place]

## 2019-12-25 NOTE — Progress Notes (Signed)
[facility] PROGRESS NOTE  Ryan Orozco QAS:[id number] DOB: 1965-07-16 DOA: 11/01/2019 PCP: Default, Provider, MD    11/16   Status: Remains inpatient appropriate because:Altered mental status, Unsafe d/c plan and Inpatient level of care appropriate due to severity of illness. Patient newly diagnosed with anoxic brain injury  Dispo: The patient is from: Home              Anticipated d/c is to: SNF              Anticipated d/c date is: > 3 days              Patient currently is not medically stable to d/c.  PMV training in process. Medicaid application in process. Patient will also need to have disability paperwork completed since do not expect full recovery.  Trach Tube has been in place greater than 30 days.   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  EMS found him pulseless in PEA, unknown downtime, successfully resuscitated and brought to the emergency department.  UDS positive for THC/benzodiazepines.  CT head unremarkable.  Failed extubation trials due to severe anoxic brain injury and had tracheostomy placed.  PEG placed on 10/15.  Now the plan is for placement in a skilled nursing facility/LTAC.  Prolonged hospital course.  Difficult placement. TOC following  Significant Hospital Events   9/29 Admit post PEA arrest. UDS positive for THC, benzo's. ETOH 177 10/05 EEG ongoing. Versed restarted overnight due to agitation. Nicardipine increased.  10/06 Developed tachypnea with WUA, no follow commands off sedation  10/07 Vomiting, TF held / restarted with recurrent vomiting 10/16 tracheostomy collar for 9  hours 10/18->10/19 off vent all night  10/20> TC 11/15 > downsized to #4 Shiley flex CFS  Subjective: Appears to be in pain and when asked about possible pain patient was pointing to head and right side of neck.  Earlier today was able to verbalize the word good to his registered nurse.  Speech difficult to interpret today but is following commands without prompting.  Objective: Vitals:   12/25/19 0500 12/25/19 0819  BP: 99/61   Pulse: 73 79  Resp: 18 20  Temp: 98 F (36.7 C)   SpO2: 100% 97%    Intake/Output Summary (Last 24 hours) at 12/25/2019 1135 Last data filed at 12/25/2019 7989 Gross per 24 hour  Intake 60 ml  Output 600 ml  Net -540 ml   Filed Weights   12/20/19 2101 12/21/19 0148 12/24/19 0500  Weight: 75.3 kg 75.3 kg 71.7 kg    Exam: Constitutional: NAD, calm, appears uncomfortable when moving upper extremities Respiratory: Clear to auscultation bilaterally upon anterior auscultation, #4 cuffless trach with 21% FiO2/TC; no tracheal secretions noted Cardiovascular: Regular rate and rhythm, no murmurs, pink, skin warm and dry Abdomen: non tender, bowel sounds positive. PEG tube Musculoskeletal: Decreased but persistent upper extremity dystonic movements with these movements appearing to cause discomfort although able to have some gross purposeful movement originating from the shoulders.  Right neck just below earlobe tender to palpation. Skin: Several decubiti as described below Neurologic: CN 2-12 grossly intact; Sensation intact both upper and lower extremities, difficult to test strength in upper extremities but appears to  be 2/5 with lower extremities 2-3/5.  Attempted to briefly follow simple commands with right upper extremity. Psychiatric:  oriented times name. Happy affect   Assessment/Plan: Acute problems: Anoxic brain injury 2/2 PEA arrest:  -Presumed 2/2 combination alcohol and BZD overdose with UDS positive for THC and BZDs -Anticipate will not  recover fully from this event/recover to previous baseline therefore long-term SNF placement recommended -Continue PT/OT/SLP -Dystonic movements improved somewhat -continue baclofen to 10 mg TID -Scheduled Tylenol for any pain associated with dystonic movements  Acute hypoxemic respiratory failure/new tracheostomy tube requirement -Continue PMV training per SLP -#4 cuffless trach-capping trial at discretion of trach team -Tracheostomy tube placed on 10/3   Pain syndrome -Appears to be having headache in right side of neck discomfort reproducible with palpation-musculoskeletal in etiology -Begin Voltaren gel -Continue to scheduled Tylenol -Add Ultram prn  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   -Continue Keppra-levetiracetam level 35.9 on 11/12 -no further seizure activity since transitioned out of ICU setting  Dysphagia 2/2 anoxic brain injury -Once tolerating PMV can assess swallowing more thoroughly -Continue tube feeding per PEG  Goals of care:  -Palliative care was involved during this hospitalization.   -Goals of care were discussed. remains full code.  - Palliative care recommended outpatient follow-up with palliative providers.  Protein calorie malnutrition nutrition Status: Nutrition Problem: Increased nutrient needs Etiology: acute illness Signs/Symptoms: estimated needs Interventions: Tube feeding Estimated body mass index is 25.5 kg/m as calculated from the following:   Height as of this encounter: 5' 6"  (1.676 m).   Weight as of this encounter: 71.7 kg.   Multiple decubiti not POA Pressure Injury 11/13/19 Anus Stage 3 -  Full thickness tissue loss. Subcutaneous fat may be visible but bone, tendon or muscle are NOT exposed. (Active)  Date First Assessed/Time First Assessed: 11/13/19 1000   Location: Anus  Staging: Stage 3 -  Full thickness tissue loss. Subcutaneous fat may be visible but bone, tendon or muscle are NOT exposed.  Present on  Admission: No    Assessments 11/17/2019  8:00 AM 12/24/2019  9:20 PM  Dressing Type None Foam - Lift dressing to assess site every shift  Dressing -- Clean;Dry;Intact  Dressing Change Frequency -- Daily  Site / Wound Assessment Pink --  Peri-wound Assessment Intact --  Drainage Amount None None     No Linked orders to display     Wound / Incision (Open or Dehisced) 11/17/19 Puncture Abdomen Left;Anterior;Upper G-tube insertion site  (Active)  Date First Assessed/Time First Assessed: 11/17/19 1646   Wound Type: Puncture  Location: Abdomen  Location Orientation: Left;Anterior;Upper  Wound Description (Comments): G-tube insertion site   Present on Admission: No    Assessments 11/17/2019  4:47 PM 12/22/2019  8:00 PM  Dressing Type Gauze (Comment);Tape dressing Gauze (Comment)  Dressing Changed New Changed  Dressing Status Clean;Dry;Intact New drainage  Dressing Change Frequency PRN --  Site / Wound Assessment Clean;Dry;Pink Friable;Red  Peri-wound Assessment Intact Maceration  Margins -- Unattached edges (unapproximated)  Closure None --  Drainage Amount None Minimal  Drainage Description -- Purulent;Serosanguineous  Treatment Cleansed Cleansed     No Linked orders to display     Pressure Injury 12/01/19 Ankle Left;Lateral Stage 2 -  Partial thickness loss of dermis presenting as a shallow open injury with a red, pink wound bed without slough. (Active)  Date First Assessed/Time First Assessed: 12/01/19 2002   Location: Ankle  Location Orientation: Left;Lateral  Staging: Stage 2 -  Partial thickness  loss of dermis presenting as a shallow open injury with a red, pink wound bed without slough.    Assessments 12/01/2019  7:52 PM 12/24/2019  9:20 PM  Dressing Type Foam - Lift dressing to assess site every shift Foam - Lift dressing to assess site every shift  Dressing Clean;Dry;Intact Clean;Dry;Intact  Site / Wound Assessment Pink --  Peri-wound Assessment Intact --  Margins Unattached  edges (unapproximated) --  Drainage Amount Scant --  Drainage Description Serosanguineous --  Treatment Cleansed --     No Linked orders to display      Other problems: Hypertension/grade 1 diastolic dysfunction:  -Blood pressure stable on amlodipine  -Echocardiogram this admission with preserved LVEF with evidence of grade 1 diastolic dysfunction. No physiology currently consistent with heart failure  HIV:  -CD4 count of 124.  -Dc'd Tivicay and [place]cov[place] infavor of Biktarvy to for eventual dc to SNF -Continue prophylactic Bactrim  Inguinal hernias -Evaluated by general surgery this admission. Documented as chronically incarcerated. Patient has been clinically stable and tolerating tube feedings and having bowel movements so unless patient obstructed would not be considered a candidate for repair. Even if obstructed consulting surgeon was not sure he would be a candidate for hernia repair regardless .   Data Reviewed: Basic Metabolic Panel: No results for input(s): NA, K, CL, CO2, GLUCOSE, BUN, CREATININE, CALCIUM, MG, PHOS in the last 168 hours. Liver Function Tests: No results for input(s): AST, ALT, ALKPHOS, BILITOT, PROT, ALBUMIN in the last 168 hours. No results for input(s): LIPASE, AMYLASE in the last 168 hours. No results for input(s): AMMONIA in the last 168 hours. CBC: No results for input(s): WBC, NEUTROABS, HGB, HCT, MCV, PLT in the last 168 hours. Cardiac Enzymes: No results for input(s): CKTOTAL, CKMB, CKMBINDEX, TROPONINI in the last 168 hours. BNP (last 3 results) No results for input(s): BNP in the last 8760 hours.  ProBNP (last 3 results) No results for input(s): PROBNP in the last 8760 hours.  CBG: Recent Labs  Lab 12/24/19 1556 12/24/19 1951 12/25/19 0002 12/25/19 0308 12/25/19 0738  GLUCAP 118* 91 109* 149* 141*    No results found for this or any previous visit (from the past 240 hour(s)).   Studies: No results found.  Scheduled  Meds: . acetaminophen (TYLENOL) oral liquid 160 mg/5 mL  650 mg Per Tube Q6H  . amLODipine  10 mg Per Tube Daily  . baclofen  10 mg Per Tube TID  . chlorhexidine gluconate (MEDLINE KIT)  15 mL Mouth Rinse BID  . Darunavir-Cobicisctat-Emtricitabine-Tenofovir Alafenamide  1 tablet Oral Q breakfast  . feeding supplement (PROSource TF)  45 mL Per Tube TID  . folic acid  1 mg Per Tube Daily  . free water  200 mL Per Tube Q4H  . heparin injection (subcutaneous)  5,000 Units Subcutaneous Q8H  . levETIRAcetam  1,500 mg Per Tube BID  . nutrition supplement (JUVEN)  1 packet Per Tube BID BM  . nystatin   Topical TID  . scopolamine  1 patch Transdermal Q72H  . sulfamethoxazole-trimethoprim  1 tablet Per Tube Once per day on Mon Wed Fri  . thiamine  100 mg Per Tube Daily   Continuous Infusions: . feeding supplement (OSMOLITE 1.5 CAL) 1,000 mL (12/25/19 0324)    Active Problems:   Cardiac arrest ([place])   Community acquired pneumonia of left upper lobe of lung   Anoxic brain injury ([place])   Alcohol abuse   Acute respiratory failure with hypoxia ([place])  Vomiting   Pressure injury of skin   Small bowel obstruction ([facility])   Palliative care encounter   Bowel obstruction ([facility])   Chronic respiratory failure ([facility])   Diastolic dysfunction   Tracheostomy dependent ([place])   Dysphagia   Protein-calorie malnutrition ([place])   Seizures ([place])   Bilateral recurrent inguinal hernia   Consultants:  PCCM  Palliative medicine  General surgery  Neurology  Interventional radiology  Procedures:  EEG  Echocardiogram  Tracheostomy  Antibiotics: Anti-infectives (From admission, onward)   Start     Dose/Rate Route Frequency Ordered Stop   12/19/19 0930  Darunavir-Cobicisctat-Emtricitabine-Tenofovir Alafenamide (SYMTUZA) 800-150-200-10 MG TABS 1 tablet        1 tablet Oral Daily with breakfast 12/19/19 0840     12/13/19 1000  bictegravir-emtricitabine-tenofovir AF (BIKTARVY) 50-200-25 MG per  tablet 1 tablet  Status:  Discontinued        1 tablet Oral Daily 12/12/19 1413 12/19/19 0840   11/17/19 1548  ceFAZolin (ANCEF) 2-4 GM/100ML-% IVPB       Note to Pharmacy: Domenick Bookbinder   : cabinet override      11/17/19 1548 11/18/19 0359   11/16/19 1515  ceFAZolin (ANCEF) IVPB 2g/100 mL premix        2 g 200 mL/hr over 30 Minutes Intravenous To Radiology 11/16/19 1511 11/17/19 1625   11/15/19 0900  sulfamethoxazole-trimethoprim (BACTRIM DS) 800-160 MG per tablet 1 tablet        1 tablet Per Tube Once per day on Mon Wed Fri 11/13/19 1906     11/13/19 1615  dolutegravir (TIVICAY) tablet 50 mg  Status:  Discontinued        50 mg Oral Daily 11/13/19 1521 12/12/19 1413   11/13/19 1615  emtricitabine-tenofovir AF (DESCOVY) 200-25 MG per tablet 1 tablet  Status:  Discontinued        1 tablet Per Tube Daily 11/13/19 1521 12/12/19 1413   11/13/19 1530  sulfamethoxazole-trimethoprim (BACTRIM DS) 800-160 MG per tablet 1 tablet  Status:  Discontinued        1 tablet Oral Once per day on Mon Wed Fri 11/13/19 1441 11/13/19 1906   11/13/19 1500  bictegravir-emtricitabine-tenofovir AF (BIKTARVY) 50-200-25 MG per tablet 1 tablet  Status:  Discontinued        1 tablet Oral Daily 11/13/19 1403 11/13/19 1521   11/10/19 1000  erythromycin 250 mg in sodium chloride 0.9 % 100 mL IVPB        250 mg 100 mL/hr over 60 Minutes Intravenous Every 8 hours 11/10/19 0907 11/11/19 2000   11/07/19 1200  ceFEPIme (MAXIPIME) 2 g in sodium chloride 0.9 % 100 mL IVPB        2 g 200 mL/hr over 30 Minutes Intravenous Every 8 hours 11/07/19 1013 11/13/19 2127   11/03/19 1200  cefTRIAXone (ROCEPHIN) 2 g in sodium chloride 0.9 % 100 mL IVPB        2 g 200 mL/hr over 30 Minutes Intravenous Every 24 hours 11/03/19 0922 11/05/19 1427   11/02/19 0800  vancomycin (VANCOREADY) IVPB 750 mg/150 mL  Status:  Discontinued        750 mg 150 mL/hr over 60 Minutes Intravenous Every 12 hours 11/01/19 1935 11/02/19 1105   11/02/19 0600   piperacillin-tazobactam (ZOSYN) IVPB 3.375 g  Status:  Discontinued        3.375 g 12.5 mL/hr over 240 Minutes Intravenous Every 8 hours 11/02/19 0311 11/03/19 0920   11/01/19 2200  ceFEPIme (MAXIPIME) 2 g in  sodium chloride 0.9 % 100 mL IVPB  Status:  Discontinued        2 g 200 mL/hr over 30 Minutes Intravenous Every 12 hours 11/01/19 2143 11/02/19 0311   11/01/19 1915  vancomycin (VANCOREADY) IVPB 1500 mg/300 mL        1,500 mg 150 mL/hr over 120 Minutes Intravenous  Once 11/01/19 1909 11/01/19 2147   11/01/19 1845  cefTRIAXone (ROCEPHIN) 1 g in sodium chloride 0.9 % 100 mL IVPB        1 g 200 mL/hr over 30 Minutes Intravenous  Once 11/01/19 1842 11/01/19 2051   11/01/19 1845  azithromycin (ZITHROMAX) 500 mg in sodium chloride 0.9 % 250 mL IVPB        500 mg 250 mL/hr over 60 Minutes Intravenous  Once 11/01/19 1842 11/01/19 2051       Time spent: 20 minutes    Erin Hearing ANP  [facility] Pager [phone]. If 7PM-7AM, please contact night-coverage at [email or web address] 12/25/2019, 11:35 AM  LOS: 54 days

## 2019-12-26 DIAGNOSIS — E441 Mild protein-calorie malnutrition: Secondary | ICD-10-CM

## 2019-12-26 LAB — GLUCOSE, CAPILLARY
Glucose-Capillary: 78 mg/dL (ref 70–99)
Glucose-Capillary: 133 mg/dL — ABNORMAL HIGH (ref 70–99)
Glucose-Capillary: 122 mg/dL — ABNORMAL HIGH (ref 70–99)
Glucose-Capillary: 90 mg/dL (ref 70–99)
Glucose-Capillary: 118 mg/dL — ABNORMAL HIGH (ref 70–99)
Glucose-Capillary: 103 mg/dL — ABNORMAL HIGH (ref 70–99)
Glucose-Capillary: 86 mg/dL (ref 70–99)

## 2019-12-26 MED ORDER — HALOPERIDOL LACTATE 5 MG/ML IJ SOLN
5.0000 mg | Freq: Four times a day (QID) | INTRAMUSCULAR | Status: AC | PRN
  Administered 2019-12-31: 5 mg via INTRAMUSCULAR
  Filled 2019-12-26 (×2): qty 1

## 2019-12-26 MED ORDER — HALOPERIDOL LACTATE 5 MG/ML IJ SOLN
5.0000 mg | Freq: Four times a day (QID) | INTRAMUSCULAR | Status: DC | PRN
  Filled 2019-12-26: qty 1

## 2019-12-26 NOTE — Progress Notes (Signed)
Physical Therapy Treatment Patient Details Name: Ryan Orozco MRN: [id number] DOB: Apr 24, 1965 Today's Date: 12/26/2019    History of Present Illness 54 year old male found down outside convenience store 9/27. Pt with PEA and hypoxic brain injury. Trach 10/13, PEG 10/15. PMHx: HIV AIDS and alcohol abuse    PT Comments    Pt shows increased tone R >L and LE>UE as well as R lateral lean tone when in bed. PROM was able to decrease tone mostly, but R LE flexor tone was still present. Pt tolerated increased EOB time while performing speech interventions and LE exercise. Pt able to participate in therapy more and follow more simple one step commands this session with the use of verbal and tactile cues. Sit to stand transfer was attempted with +2 PTs and SLP, but was unsuccessful due to LE knee flexion tone and pts inability to help.  Follow Up Recommendations  SNF;Supervision/Assistance - 24 hour     Equipment Recommendations  Wheelchair (measurements PT);Wheelchair cushion (measurements PT);Hospital bed;Other (comment)    Recommendations for Other Services       Precautions / Restrictions Precautions Precaution Comments: trach, PEG, anoxic, flexor tone R LE > UE and extensor lurch Restrictions Weight Bearing Restrictions: No    Mobility  Bed Mobility Overal bed mobility: Needs Assistance Bed Mobility: Supine to Sit;Sit to Supine     Supine to sit: Total assist;+2 for physical assistance Sit to supine: Total assist;+2 for physical assistance   General bed mobility comments: total assist +2 secondary to pt not being able to assist with bed mobility and having tone putting him into a R lateral lean and flexion posture  Transfers Overall transfer level: Needs assistance   Transfers: Sit to/from Stand Sit to Stand: Total assist;+2 physical assistance;From elevated surface         General transfer comment: PT attempted sit to stand transfer 1x with +2 PT's. 2 PTs used bed  pad to pull to stand while blocking bilateral knees with SLP guarding from back. Sit to stand transfer unsuccessful secondary to pt not being able to help and bilateral LE knee flexion tone  Ambulation/Gait             General Gait Details: unable   Stairs             Wheelchair Mobility    Modified Rankin (Stroke Patients Only)       Balance Overall balance assessment: Needs assistance Sitting-balance support: Feet supported;Bilateral upper extremity supported Sitting balance-Leahy Scale: Zero Sitting balance - Comments: EOB sitting 20 minutes. Pt was mod assist +2 during static sitting with one PT guarding posteriorly while other PT guarded anteriorly while blocking knees. Pt became max assist +2 when pt would posteriorly or laterally lurch. Postural control: Posterior lean;Right lateral lean;Left lateral lean                                  Cognition Arousal/Alertness: Awake/alert Behavior During Therapy: Flat affect Overall Cognitive Status: Impaired/Different from baseline Area of Impairment: Problem solving;Awareness;Safety/judgement;Following commands;Memory;Attention;Orientation               Rancho Levels of Cognitive Functioning Ranch[place] Amigos Scales of Cognitive Functioning: Confused/inappropriate/non-agitated Orientation Level: Disoriented to;Place;Time;Situation Current Attention Level: Focused Memory: Decreased short-term memory Following Commands: Follows one step commands inconsistently;Follows one step commands with increased time Safety/Judgement: Decreased awareness of deficits;Decreased awareness of safety Awareness: Intellectual Problem Solving: Slow processing;Requires tactile cues;Requires verbal cues;Decreased  initiation;Difficulty sequencing General Comments: pt alert and able to follow simple commands with multimodal cueing      Exercises General Exercises - Upper Extremity Elbow Extension: PROM;Both;Seated;10  reps General Exercises - Lower Extremity Quad Sets: PROM;Both;10 reps;Supine (R>L knee flexion contracture) Short Arc Quad: AROM;Strengthening;Both;5 reps;Seated (pt needed constant tactile tapping on anterior shin or quad)    General Comments        Pertinent Vitals/Pain Pain Assessment: (P) Faces Faces Pain Scale: (P) Hurts a little bit Pain Location: with R Knee extension Pain Descriptors / Indicators: Grimacing;Guarding Pain Intervention(s): (P) Monitored during session;Repositioned    Home Living                      Prior Function            PT Goals (current goals can now be found in the care plan section) Acute Rehab PT Goals Patient Stated Goal: none stated PT Goal Formulation: With patient Time For Goal Achievement: 01/04/20 Potential to Achieve Goals: Fair Progress towards PT goals: Progressing toward goals    Frequency    Min 2X/week      PT Plan Current plan remains appropriate    Co-evaluation PT/OT/SLP Co-Evaluation/Treatment: Yes   PT goals addressed during session: Mobility/safety with mobility;Balance;Strengthening/ROM (balance)   SLP goals addressed during session: Cognition;Communication;Swallowing    AM-PAC PT "6 Clicks" Mobility   Outcome Measure  Help needed turning from your back to your side while in a flat bed without using bedrails?: Total Help needed moving from lying on your back to sitting on the side of a flat bed without using bedrails?: Total Help needed moving to and from a bed to a chair (including a wheelchair)?: Total Help needed standing up from a chair using your arms (e.g., wheelchair or bedside chair)?: Total Help needed to walk in hospital room?: Total Help needed climbing 3-5 steps with a railing? : Total 6 Click Score: 6    End of Session Equipment Utilized During Treatment: Oxygen Activity Tolerance: Patient tolerated treatment well Patient left: in bed;with call bell/phone within reach;with bed alarm  set Nurse Communication: Mobility status PT Visit Diagnosis: Other abnormalities of gait and mobility (R26.89);Muscle weakness (generalized) (M62.81)     Time: 8811-0315 PT Time Calculation (min) (ACUTE ONLY): 32 min  Charges:  $Therapeutic Activity: 23-37 mins                    Jeri Cos, SPT [id number]   Reena Borromeo 12/26/2019, 10:37 AM

## 2019-12-26 NOTE — Progress Notes (Signed)
[facility] PROGRESS NOTE  Zai [place] WLN:[id number] DOB: [birth year] DOA: 11/01/2019 PCP: Default, Provider, MD    11/16   Status: Remains inpatient appropriate because:Altered mental status, Unsafe d/c plan and Inpatient level of care appropriate due to severity of illness. Patient newly diagnosed with anoxic brain injury  Dispo: The patient is from: Home              Anticipated d/c is to: SNF              Anticipated d/c date is: > 3 days              Patient currently is not medically stable to d/c.  PMV training in process. Medicaid application in process. Patient will also need to have disability paperwork completed since do not expect full recovery.  Trach Tube has been in place greater than 30 days.   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  EMS found him pulseless in PEA, unknown downtime, successfully resuscitated and brought to the emergency department.  UDS positive for THC/benzodiazepines.  CT head unremarkable.  Failed extubation trials due to severe anoxic brain injury and had tracheostomy placed.  PEG placed on 10/15.  Now the plan is for placement in a skilled nursing facility/LTAC.  Prolonged hospital course.  Difficult placement. TOC following  Significant Hospital Events   9/29 Admit post PEA arrest. UDS positive for THC, benzo's. ETOH 177 10/05 EEG ongoing. Versed restarted overnight due to agitation. Nicardipine increased.  10/06 Developed tachypnea with WUA, no follow commands off sedation  10/07 Vomiting, TF held / restarted with recurrent vomiting 10/16 tracheostomy collar for 9  hours 10/18->10/19 off vent all night  10/20> TC 11/15 > downsized to #4 Shiley flex CFS  Subjective: Awakened.  Repeatedly trying to sit up and look outside of room.  Does not appear to be as uncomfortable as yesterday.  Objective: Vitals:   12/26/19 0510 12/26/19 0759  BP: 115/68   Pulse: 74 87  Resp: 18 18  Temp: 98.1 F (36.7 C)   SpO2: 98% 96%    Intake/Output Summary (Last 24 hours) at 12/26/2019 1108 Last data filed at 12/26/2019 0357 Gross per 24 hour  Intake 1 ml  Output 520 ml  Net -519 ml   Filed Weights   12/24/19 0500 12/25/19 2216 12/26/19 0124  Weight: 71.7 kg 74.3 kg 74.3 kg    Exam: Constitutional: NAD, calm, much more comfortable Respiratory: Clear to auscultation bilaterally upon anterior auscultation, #4 cuffless trach with 21% FiO2/TC; minimal tracheal secretions  Cardiovascular: Regular rate and rhythm, no murmurs, pink, skin warm and dry Abdomen: non tender, bowel sounds positive. PEG tube Musculoskeletal: Mild to moderate upper extremity dystonic movements with activity.  Right neck nontender to palpation today Skin: Several decubiti as described below Neurologic: CN 2-12 grossly intact; Sensation intact both upper and lower extremities, difficult to test strength in upper extremities but appears to be 2/5 with lower extremities 2-3/5.  Attempted to briefly follow simple commands with right upper extremity. Psychiatric:  oriented times name. Happy affect   Assessment/Plan: Acute problems: Anoxic brain injury 2/2 PEA arrest:  -Presumed 2/2 combination alcohol and BZD overdose with UDS positive for  THC and BZDs -Anticipate will not recover fully from this event/recover to previous baseline therefore long-term SNF placement recommended -Continue PT/OT/SLP -continue baclofen to 10 mg TID for dystonic movements -Scheduled Tylenol for any pain associated with dystonic movements  Acute hypoxemic respiratory failure/new tracheostomy tube  requirement -Continue PMV training per SLP -#4 cuffless trach-capping trial at discretion of trach team -Tracheostomy tube placed on 10/3   Pain syndrome -Appears to not be in pain after initiating scheduled medications as below -Continue Voltaren gel and scheduled Tylenol along with Ultram prn  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   -Continue Keppra-levetiracetam level 35.9 on 11/12 -no further seizure activity since transitioned out of ICU setting  Dysphagia 2/2 anoxic brain injury -Continue tube feeding per PEG -Repeat SLP evaluation on 11/23 with recommendation for n.p.o. status  Goals of care:  -Palliative care was involved during this hospitalization.   -Goals of care were discussed. remains full code.  - Palliative care recommended outpatient follow-up with palliative providers.  Protein calorie malnutrition nutrition Status: Nutrition Problem: Increased nutrient needs Etiology: acute illness Signs/Symptoms: estimated needs Interventions: Tube feeding Estimated body mass index is 26.45 kg/m as calculated from the following:   Height as of this encounter: 5' 6"  (1.676 m).   Weight as of this encounter: 74.3 kg.   Multiple decubiti not POA Pressure Injury 11/13/19 Anus Stage 3 -  Full thickness tissue loss. Subcutaneous fat may be visible but bone, tendon or muscle are NOT exposed. (Active)  Date First Assessed/Time First Assessed: 11/13/19 1000   Location: Anus  Staging: Stage 3 -  Full thickness tissue loss. Subcutaneous fat may be visible but bone, tendon or muscle are NOT exposed.  Present on Admission: No    Assessments 11/17/2019  8:00 AM 12/24/2019  9:20 PM  Dressing Type None Foam - Lift dressing to assess site every shift  Dressing -- Clean;Dry;Intact  Dressing Change Frequency -- Daily  Site / Wound Assessment Pink --  Peri-wound Assessment Intact --  Drainage Amount None None     No Linked orders to display     Wound / Incision (Open  or Dehisced) 11/17/19 Puncture Abdomen Left;Anterior;Upper G-tube insertion site  (Active)  Date First Assessed/Time First Assessed: 11/17/19 1646   Wound Type: Puncture  Location: Abdomen  Location Orientation: Left;Anterior;Upper  Wound Description (Comments): G-tube insertion site   Present on Admission: No    Assessments 11/17/2019  4:47 PM 12/22/2019  8:00 PM  Dressing Type Gauze (Comment);Tape dressing Gauze (Comment)  Dressing Changed New Changed  Dressing Status Clean;Dry;Intact New drainage  Dressing Change Frequency PRN --  Site / Wound Assessment Clean;Dry;Pink Friable;Red  Peri-wound Assessment Intact Maceration  Margins -- Unattached edges (unapproximated)  Closure None --  Drainage Amount None Minimal  Drainage Description -- Purulent;Serosanguineous  Treatment Cleansed Cleansed     No Linked orders to display     Pressure Injury 12/01/19 Ankle Left;Lateral Stage 2 -  Partial thickness loss of dermis presenting as a shallow open injury with a red, pink wound bed without slough. (Active)  Date First Assessed/Time First Assessed: 12/01/19 2002   Location: Ankle  Location Orientation: Left;Lateral  Staging: Stage 2 -  Partial thickness loss of dermis presenting as a shallow open injury with a red, pink wound bed without slough.    Assessments 12/01/2019  7:52 PM 12/24/2019  9:20 PM  Dressing Type Foam - Lift dressing to assess site every shift Foam - Lift dressing to assess site every shift  Dressing Clean;Dry;Intact Clean;Dry;Intact  Site / Wound Assessment Pink --  Peri-wound Assessment Intact --  Margins Unattached edges (unapproximated) --  Drainage Amount Scant --  Drainage Description Serosanguineous --  Treatment Cleansed --     No Linked orders to display      Other problems: Hypertension/grade 1 diastolic dysfunction:  -Blood pressure stable on amlodipine  -Echocardiogram this admission with preserved LVEF with evidence of grade 1 diastolic dysfunction. No  physiology currently consistent with heart failure  HIV:  -CD4 count of 124.  -Dc'd Tivicay and Descovy infavor of Biktarvy to for eventual dc to SNF -Continue prophylactic Bactrim  Inguinal hernias -Evaluated by general surgery this admission. Documented as chronically incarcerated. Patient has been clinically stable and tolerating tube feedings and having bowel movements so unless patient obstructed would not be considered a candidate for repair. Even if obstructed consulting surgeon was not sure he would be a candidate for hernia repair regardless .   Data Reviewed: Basic Metabolic Panel: No results for input(s): NA, K, CL, CO2, GLUCOSE, BUN, CREATININE, CALCIUM, MG, PHOS in the last 168 hours. Liver Function Tests: No results for input(s): AST, ALT, ALKPHOS, BILITOT, PROT, ALBUMIN in the last 168 hours. No results for input(s): LIPASE, AMYLASE in the last 168 hours. No results for input(s): AMMONIA in the last 168 hours. CBC: No results for input(s): WBC, NEUTROABS, HGB, HCT, MCV, PLT in the last 168 hours. Cardiac Enzymes: No results for input(s): CKTOTAL, CKMB, CKMBINDEX, TROPONINI in the last 168 hours. BNP (last 3 results) No results for input(s): BNP in the last 8760 hours.  ProBNP (last 3 results) No results for input(s): PROBNP in the last 8760 hours.  CBG: Recent Labs  Lab 12/25/19 1641 12/25/19 1935 12/25/19 2304 12/26/19 0317 12/26/19 0758  GLUCAP 91 94 107* 133* 122*    No results found for this or any previous visit (from the past 240 hour(s)).   Studies: No results found.  Scheduled Meds: . acetaminophen (TYLENOL) oral liquid 160 mg/5 mL  650 mg Per Tube Q6H  . amLODipine  10 mg Per Tube Daily  . baclofen  10 mg Per Tube TID  . chlorhexidine gluconate (MEDLINE KIT)  15 mL Mouth Rinse BID  . Darunavir-Cobicisctat-Emtricitabine-Tenofovir Alafenamide  1 tablet Oral Q breakfast  . diclofenac Sodium  4 g Topical QID  . feeding supplement (PROSource  TF)  45 mL Per Tube TID  . folic acid  1 mg Per Tube Daily  . free water  200 mL Per Tube Q4H  . heparin injection (subcutaneous)  5,000 Units Subcutaneous Q8H  . levETIRAcetam  1,500 mg Per Tube BID  . nutrition supplement (JUVEN)  1 packet Per Tube BID BM  . nystatin   Topical TID  . scopolamine  1 patch Transdermal Q72H  . sulfamethoxazole-trimethoprim  1 tablet Per Tube Once per day on Mon Wed Fri  . thiamine  100 mg Per Tube Daily   Continuous Infusions: . feeding supplement (OSMOLITE 1.5 CAL) 1,000 mL (12/26/19 0024)    Active Problems:   Cardiac arrest ([place])   Community acquired pneumonia of left upper lobe of lung   Anoxic brain injury ([place])   Alcohol abuse   Acute respiratory failure with hypoxia ([facility])   Vomiting   Pressure injury of skin   Small bowel obstruction ([facility])   Palliative care encounter   Bowel obstruction ([place])   Chronic respiratory failure ([place])   Diastolic dysfunction   Tracheostomy dependent ([place])   Dysphagia  Protein-calorie malnutrition ([employer])   Seizures ([place])   Bilateral recurrent inguinal hernia   Consultants:  PCCM  Palliative medicine  General surgery  Neurology  Interventional radiology  Procedures:  EEG  Echocardiogram  Tracheostomy  Antibiotics: Anti-infectives (From admission, onward)   Start     Dose/Rate Route Frequency Ordered Stop   12/19/19 0930  Darunavir-Cobicisctat-Emtricitabine-Tenofovir Alafenamide (SYMTUZA) 800-150-200-10 MG TABS 1 tablet        1 tablet Oral Daily with breakfast 12/19/19 0840     12/13/19 1000  bictegravir-emtricitabine-tenofovir AF (BIKTARVY) 50-200-25 MG per tablet 1 tablet  Status:  Discontinued        1 tablet Oral Daily 12/12/19 1413 12/19/19 0840   11/17/19 1548  ceFAZolin (ANCEF) 2-4 GM/100ML-% IVPB       Note to Pharmacy: Domenick Bookbinder   : cabinet override      11/17/19 1548 11/18/19 0359   11/16/19 1515  ceFAZolin (ANCEF) IVPB 2g/100 mL premix        2 g 200 mL/hr over 30  Minutes Intravenous To Radiology 11/16/19 1511 11/17/19 1625   11/15/19 0900  sulfamethoxazole-trimethoprim (BACTRIM DS) 800-160 MG per tablet 1 tablet        1 tablet Per Tube Once per day on Mon Wed Fri 11/13/19 1906     11/13/19 1615  dolutegravir (TIVICAY) tablet 50 mg  Status:  Discontinued        50 mg Oral Daily 11/13/19 1521 12/12/19 1413   11/13/19 1615  emtricitabine-tenofovir AF (DESCOVY) 200-25 MG per tablet 1 tablet  Status:  Discontinued        1 tablet Per Tube Daily 11/13/19 1521 12/12/19 1413   11/13/19 1530  sulfamethoxazole-trimethoprim (BACTRIM DS) 800-160 MG per tablet 1 tablet  Status:  Discontinued        1 tablet Oral Once per day on Mon Wed Fri 11/13/19 1441 11/13/19 1906   11/13/19 1500  bictegravir-emtricitabine-tenofovir AF (BIKTARVY) 50-200-25 MG per tablet 1 tablet  Status:  Discontinued        1 tablet Oral Daily 11/13/19 1403 11/13/19 1521   11/10/19 1000  erythromycin 250 mg in sodium chloride 0.9 % 100 mL IVPB        250 mg 100 mL/hr over 60 Minutes Intravenous Every 8 hours 11/10/19 0907 11/11/19 2000   11/07/19 1200  ceFEPIme (MAXIPIME) 2 g in sodium chloride 0.9 % 100 mL IVPB        2 g 200 mL/hr over 30 Minutes Intravenous Every 8 hours 11/07/19 1013 11/13/19 2127   11/03/19 1200  cefTRIAXone (ROCEPHIN) 2 g in sodium chloride 0.9 % 100 mL IVPB        2 g 200 mL/hr over 30 Minutes Intravenous Every 24 hours 11/03/19 0922 11/05/19 1427   11/02/19 0800  vancomycin (VANCOREADY) IVPB 750 mg/150 mL  Status:  Discontinued        750 mg 150 mL/hr over 60 Minutes Intravenous Every 12 hours 11/01/19 1935 11/02/19 1105   11/02/19 0600  piperacillin-tazobactam (ZOSYN) IVPB 3.375 g  Status:  Discontinued        3.375 g 12.5 mL/hr over 240 Minutes Intravenous Every 8 hours 11/02/19 0311 11/03/19 0920   11/01/19 2200  ceFEPIme (MAXIPIME) 2 g in sodium chloride 0.9 % 100 mL IVPB  Status:  Discontinued        2 g 200 mL/hr over 30 Minutes Intravenous Every 12 hours  11/01/19 2143 11/02/19 0311   11/01/19 1915  vancomycin (VANCOREADY) IVPB 1500 mg/300  mL        1,500 mg 150 mL/hr over 120 Minutes Intravenous  Once 11/01/19 1909 11/01/19 2147   11/01/19 1845  cefTRIAXone (ROCEPHIN) 1 g in sodium chloride 0.9 % 100 mL IVPB        1 g 200 mL/hr over 30 Minutes Intravenous  Once 11/01/19 1842 11/01/19 2051   11/01/19 1845  azithromycin (ZITHROMAX) 500 mg in sodium chloride 0.9 % 250 mL IVPB        500 mg 250 mL/hr over 60 Minutes Intravenous  Once 11/01/19 1842 11/01/19 2051       Time spent: 20 minutes    Erin Hearing ANP  [facility] Pager [phone]. If 7PM-7AM, please contact night-coverage at [email or web address] 12/26/2019, 11:08 AM  LOS: 55 days

## 2019-12-26 NOTE — Progress Notes (Addendum)
  Speech Language Pathology Treatment: Dysphagia;Passy Muir Speaking valve  Patient Details Name: Ryan Orozco MRN: [id number] DOB: [birth year] Today's Date: 12/26/2019 Time: 7342-8768 SLP Time Calculation (min) (ACUTE ONLY): 28 min  Assessment / Plan / Recommendation Clinical Impression  Cognition, speech/verbal communication and po trials attempted with PT He was responsive to most questions and interactive today. [place] followed simple commands with approximately 70% accuracy which is increased from prior. He imitated sounds/words today "hey", counting 1-5 (3 of 5 approximated). Suspect poor visual ability as he can track therapist objects but does not appear to focus.  Please see bedside swallow assessment for details with puree, ice and tsp water   HPI HPI: 54 y/o male with hx of HIV, ETOH abuse found down outside a convenience store on 9/27, received out of hospital CPR. Presents with acute hypoxic respiratory failure s/p cardiac arrest, acute encephalopathy due to hypoxic brain injury. Intubated 9/27-10/13. Received a tracheostomy 10/13, treachestomy collar trial for 9 hours on 10/16. CXR on 10/17 improving and shows left lung opacities have essentially resolved      SLP Plan  Continue with current plan of care       Recommendations         Patient may use Passy-Muir Speech Valve: During all therapies with supervision PMSV Supervision: Full         Oral Care Recommendations: Oral care QID Follow up Recommendations: LTACH;Skilled Nursing facility SLP Visit Diagnosis: Aphonia (R49.1);Cognitive communication deficit (T15.726) Plan: Continue with current plan of care                      Royce Macadamia 12/26/2019, 10:15 AM  [name].Ed Nurse, children's [phone] Office [phone]

## 2019-12-26 NOTE — Evaluation (Signed)
Clinical/Bedside Swallow Evaluation Patient Details  Name: Ryan Orozco MRN: [id number] Date of Birth: 06-Apr-1965  Today's Date: 12/26/2019 Time: SLP Start Time (ACUTE ONLY): 4034 SLP Stop Time (ACUTE ONLY): 0934 SLP Time Calculation (min) (ACUTE ONLY): 7 min  Past Medical History:  Past Medical History:  Diagnosis Date  . Alcohol abuse   . HIV (human immunodeficiency virus infection) (HCC) 1995   Past Surgical History:  Past Surgical History:  Procedure Laterality Date  . IR GASTROSTOMY TUBE MOD SED  11/17/2019  . NO PAST SURGERIES     HPI:  54 y/o male with hx of HIV, ETOH abuse found down outside a convenience store on 9/27, received out of hospital CPR. Presents with acute hypoxic respiratory failure s/p cardiac arrest, acute encephalopathy due to hypoxic brain injury. Intubated 9/27-10/13. Received a tracheostomy 10/13, treachestomy collar trial for 9 hours on 10/16. CXR on 10/17 improving and shows left lung opacities have essentially resolved   Assessment / Plan / Recommendation Clinical Impression  Iver accepted ice chip, teaspoon water, and applesauce with hand over hand/total assist feeding. Delayed labial closure around spoon with greater cohesion with puree than thin. Immediate cough after tsp thin and expectoration with suspected laryngeal intrusion. Puree did not yield overt s/s aspiration although question premature inhalation. Continue PEG feedings and ST will include po trials during sessions when appropriate.    SLP Visit Diagnosis: Dysphagia, oropharyngeal phase (R13.12)    Aspiration Risk  Severe aspiration risk;Moderate aspiration risk    Diet Recommendation NPO   Medication Administration: Via alternative means    Other  Recommendations Oral Care Recommendations: Oral care QID   Follow up Recommendations LTACH;Skilled Nursing facility      Frequency and Duration min 1 x/week  2 weeks       Prognosis Prognosis for Safe Diet Advancement:   (fair-good) Barriers to Reach Goals: Cognitive deficits      Swallow Study   General HPI: 54 y/o male with hx of HIV, ETOH abuse found down outside a convenience store on 9/27, received out of hospital CPR. Presents with acute hypoxic respiratory failure s/p cardiac arrest, acute encephalopathy due to hypoxic brain injury. Intubated 9/27-10/13. Received a tracheostomy 10/13, treachestomy collar trial for 9 hours on 10/16. CXR on 10/17 improving and shows left lung opacities have essentially resolved Type of Study: Bedside Swallow Evaluation Previous Swallow Assessment: none Diet Prior to this Study: NPO;PEG tube Temperature Spikes Noted: No Respiratory Status: Trach;Trach Collar Trach Size and Type: #6;Uncuffed;Extra long;With PMSV in place History of Recent Intubation: No Behavior/Cognition: Alert;Cooperative;Confused;Pleasant mood;Requires cueing;Distractible Oral Cavity Assessment: Within Functional Limits Oral Care Completed by SLP: Yes Oral Cavity - Dentition: Poor condition Vision: Impaired for self-feeding Self-Feeding Abilities: Total assist Patient Positioning: Other (comment) (edge of bed) Baseline Vocal Quality: Normal Volitional Cough: Cognitively unable to elicit Volitional Swallow: Unable to elicit    Oral/Motor/Sensory Function Overall Oral Motor/Sensory Function: Generalized oral weakness   Ice Chips Ice chips: Impaired Presentation: Spoon Oral Phase Impairments: Reduced labial seal;Reduced lingual movement/coordination Oral Phase Functional Implications: Oral holding   Thin Liquid Thin Liquid: Impaired Presentation: Spoon Oral Phase Impairments: Reduced labial seal;Reduced lingual movement/coordination Oral Phase Functional Implications: Left anterior spillage;Right anterior spillage Pharyngeal  Phase Impairments: Cough - Immediate;Other (comments);Suspected delayed Swallow (expectoration of bolus)    Nectar Thick Nectar Thick Liquid: Not tested   Honey Thick  Honey Thick Liquid: Not tested   Puree Puree: Impaired Presentation: Spoon Oral Phase Impairments: Reduced labial seal Pharyngeal Phase Impairments: Multiple swallows;Other (  comments) (audible)   Solid     Solid: Not tested      Royce Macadamia 12/26/2019,10:42 AM [name].Ed Nurse, children's [phone] Office [phone]

## 2019-12-27 LAB — GLUCOSE, CAPILLARY
Glucose-Capillary: 133 mg/dL — ABNORMAL HIGH (ref 70–99)
Glucose-Capillary: 118 mg/dL — ABNORMAL HIGH (ref 70–99)
Glucose-Capillary: 133 mg/dL — ABNORMAL HIGH (ref 70–99)
Glucose-Capillary: 113 mg/dL — ABNORMAL HIGH (ref 70–99)
Glucose-Capillary: 122 mg/dL — ABNORMAL HIGH (ref 70–99)

## 2019-12-27 MED ORDER — IBUPROFEN 100 MG/5ML PO SUSP
400.0000 mg | Freq: Three times a day (TID) | ORAL | Status: DC
  Administered 2019-12-27 – 2019-12-30 (×9): 400 mg
  Filled 2019-12-27 (×10): qty 20

## 2019-12-27 MED ORDER — FAMOTIDINE 40 MG/5ML PO SUSR
10.0000 mg | Freq: Two times a day (BID) | ORAL | Status: DC
  Administered 2019-12-27 – 2020-04-08 (×207): 10.4 mg
  Filled 2019-12-27 (×210): qty 2.5

## 2019-12-27 MED ORDER — QUETIAPINE FUMARATE 25 MG PO TABS
25.0000 mg | ORAL_TABLET | Freq: Every day | ORAL | Status: DC
  Administered 2019-12-27 – 2019-12-28 (×2): 25 mg via ORAL
  Filled 2019-12-27 (×2): qty 1

## 2019-12-27 NOTE — Progress Notes (Signed)
Asked for restraints due to pt moving all around bed with wires wrapped around face and arms. Pt is in mittens due to pulling at trach collar and actual trach. Pt given meds but he is still a harm to himself. Nurse and tech intervened multiple times during shift.

## 2019-12-27 NOTE — Progress Notes (Addendum)
[facility] PROGRESS NOTE  Benard [place] ZOX:[id number] DOB: Oct 27, 1965 DOA: 11/01/2019 PCP: Default, Provider, MD    11/16   Status: Remains inpatient appropriate because:Altered mental status, Unsafe d/c plan and Inpatient level of care appropriate due to severity of illness. Patient newly diagnosed with anoxic brain injury  Dispo: The patient is from: Home              Anticipated d/c is to: SNF              Anticipated d/c date is: > 3 days              Patient currently is medically stable to d/c.  PMV training in process. Medicaid application in process. Patient will also need to have disability paperwork completed since do not expect full recovery.  Trach Tube has been in place greater than 30 days.   Code Status: Full Family Communication: 11/24 daughter at bedside DVT prophylaxis: Subcutaneous heparin Vaccination status: Does not appear to have received Covid vaccine but will need to confirm with family  Foley catheter: No  HPI: 54 year old male with HIV, chronic alcohol abuse who was presented to the emergency department after being found unresponsive outside a convinient store.  EMS found him pulseless in PEA, unknown downtime, successfully resuscitated and brought to the emergency department.  UDS positive for THC/benzodiazepines.  CT head unremarkable.  Failed extubation trials due to severe anoxic brain injury and had tracheostomy placed.  PEG placed on 10/15.  Now the plan is for placement in a skilled nursing facility/LTAC.  Prolonged hospital course.  Difficult placement. TOC following  Significant Hospital Events   9/29 Admit post PEA arrest. UDS positive for THC, benzo's. ETOH 177 10/05 EEG ongoing. Versed restarted overnight due to agitation. Nicardipine increased.  10/06 Developed tachypnea with WUA, no follow commands off sedation  10/07 Vomiting, TF held / restarted with recurrent vomiting 10/16 tracheostomy collar for 9 hours 10/18->10/19 off vent  all night  10/20> TC 11/15 > downsized to #4 Shiley flex CFS  Subjective: Awakened.  Smiling during exam.  Given his difficulty with communication secondary to brain injury unsure if patient was having nightmares or hallucinations overnight which may have prompted his behavioral issue.  Per my observation on 11/23 as well as confirmation from bedside staff from same date patient more animated with attempts to get out of bed and look outside of room in the hall.  Objective: Vitals:   12/27/19 0415 12/27/19 0757  BP:    Pulse: 77 85  Resp: 18 18  Temp:    SpO2: 97% 96%    Intake/Output Summary (Last 24 hours) at 12/27/2019 1056 Last data filed at 12/27/2019 0347 Gross per 24 hour  Intake --  Output 1000 ml  Net -1000 ml   Filed Weights   12/26/19 0124 12/26/19 2143 12/27/19 0118  Weight: 74.3 kg 74.4 kg 74.4 kg    Exam: Constitutional: NAD, calm, peers to be comfortable Respiratory: Clear to auscultation bilaterally upon anterior auscultation, #4 cuffless trach with 21% FiO2/TC; minimal tracheal secretions which are managed by strong cough Cardiovascular: Regular rate and rhythm, skin warm and dry Abdomen: non tender, bowel sounds positive. PEG tube Musculoskeletal: Mild to moderate upper extremity dystonic movements with activity.   Skin: Several decubiti as described below Neurologic: CN 2-12 grossly intact; Sensation intact both upper and lower extremities, difficult to test strength in upper extremities but appears to be 2/5 with lower extremities 2-3/5.   Psychiatric:  oriented times  name.  Continues with happy affect   Assessment/Plan: Acute problems: Anoxic brain injury 2/2 PEA arrest:  -Presumed 2/2 combination alcohol and BZD overdose with UDS positive for THC and BZDs -Anticipate will not recover fully from this event/recover to previous baseline therefore long-term SNF placement recommended -Continue PT/OT/SLP -continue baclofen to 10 mg TID for dystonic  movements -Scheduled Tylenol for any pain associated with dystonic movements -Having some nocturnal behavioral issues and agitation therefore have added Seroquel 25 mg hs  Acute hypoxemic respiratory failure/new tracheostomy tube requirement -Continue PMV training per SLP -#4 cuffless trach-capping trial at discretion of trach team -Tracheostomy tube placed on 10/3   Pain syndrome -Appears to not be in pain after initiating scheduled medications as below -Continue Voltaren gel and scheduled Tylenol  -Due to recent seizure activity requiring AEDs I will discontinue Ultram -Renal function is within normal limits as of 10/24 therefore will begin 5 days of scheduled low-dose ibuprofen per tube  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   -Continue Keppra-levetiracetam level 35.9 on 11/12 -no further seizure activity since transitioned out of ICU setting  Dysphagia 2/2 anoxic brain injury -Continue tube feeding per PEG -Repeat SLP evaluation on 11/23 with recommendation for n.p.o. status  Goals of care:  -Palliative care was involved during this hospitalization.   -Goals of care were discussed. remains full code.  - Palliative care recommended outpatient follow-up with palliative providers.  Protein calorie malnutrition nutrition Status: Nutrition Problem: Increased nutrient needs Etiology: acute illness Signs/Symptoms: estimated needs Interventions: Tube feeding Estimated body mass index is 26.49 kg/m as calculated from the following:   Height as of this encounter: 5' 6"  (1.676 m).   Weight as of this encounter: 74.4 kg.   Multiple decubiti not POA Pressure Injury 11/13/19 Anus Stage 3 -  Full thickness tissue loss. Subcutaneous fat may be visible but bone, tendon or muscle are NOT exposed. (Active)  Date First Assessed/Time First Assessed: 11/13/19 1000   Location: Anus  Staging: Stage 3 -  Full thickness tissue loss. Subcutaneous fat may be visible but bone,  tendon or muscle are NOT exposed.  Present on Admission: No    Assessments 11/17/2019  8:00 AM 12/27/2019 10:00 AM  Dressing Type None --  Site / Wound Assessment Pink --  Peri-wound Assessment Intact --  Wound Length (cm) -- 1 cm  Wound Width (cm) -- 0.5 cm  Wound Depth (cm) -- 0 cm  Wound Surface Area (cm^2) -- 0.5 cm^2  Wound Volume (cm^3) -- 0 cm^3  Drainage Amount None --     No Linked orders to display     Wound / Incision (Open or Dehisced) 11/17/19 Puncture Abdomen Left;Anterior;Upper G-tube insertion site  (Active)  Date First Assessed/Time First Assessed: 11/17/19 1646   Wound Type: Puncture  Location: Abdomen  Location Orientation: Left;Anterior;Upper  Wound Description (Comments): G-tube insertion site   Present on Admission: No    Assessments 11/17/2019  4:47 PM 12/27/2019 10:00 AM  Dressing Type Gauze (Comment);Tape dressing --  Dressing Changed New --  Dressing Status Clean;Dry;Intact --  Dressing Change Frequency PRN --  Site / Wound Assessment Clean;Dry;Pink --  Peri-wound Assessment Intact --  Wound Length (cm) -- 0 cm  Wound Width (cm) -- 0 cm  Wound Depth (cm) -- 0 cm  Wound Volume (cm^3) -- 0 cm^3  Wound Surface Area (cm^2) -- 0 cm^2  Closure None --  Drainage Amount None --  Treatment Cleansed --     No Linked  orders to display     Pressure Injury 12/01/19 Ankle Left;Lateral Stage 2 -  Partial thickness loss of dermis presenting as a shallow open injury with a red, pink wound bed without slough. (Active)  Date First Assessed/Time First Assessed: 12/01/19 2002   Location: Ankle  Location Orientation: Left;Lateral  Staging: Stage 2 -  Partial thickness loss of dermis presenting as a shallow open injury with a red, pink wound bed without slough.    Assessments 12/01/2019  7:52 PM 12/27/2019 10:00 AM  Dressing Type Foam - Lift dressing to assess site every shift --  Dressing Clean;Dry;Intact --  Site / Wound Assessment Pink --  Peri-wound Assessment Intact  --  Wound Length (cm) -- 2.5 cm  Wound Width (cm) -- 0.5 cm  Wound Depth (cm) -- 0 cm  Wound Surface Area (cm^2) -- 1.25 cm^2  Wound Volume (cm^3) -- 0 cm^3  Margins Unattached edges (unapproximated) --  Drainage Amount Scant --  Drainage Description Serosanguineous --  Treatment Cleansed --     No Linked orders to display      Other problems: Hypertension/grade 1 diastolic dysfunction:  -Blood pressure stable on amlodipine  -Echocardiogram this admission with preserved LVEF with evidence of grade 1 diastolic dysfunction. No physiology currently consistent with heart failure  HIV:  -CD4 count of 124.  -Dc'd Tivicay and Descovy infavor of Biktarvy to for eventual dc to SNF -Continue prophylactic Bactrim  Inguinal hernias -Evaluated by general surgery this admission. Documented as chronically incarcerated. Patient has been clinically stable and tolerating tube feedings and having bowel movements so unless patient obstructed would not be considered a candidate for repair. Even if obstructed consulting surgeon was not sure he would be a candidate for hernia repair regardless .   Data Reviewed: Basic Metabolic Panel: No results for input(s): NA, K, CL, CO2, GLUCOSE, BUN, CREATININE, CALCIUM, MG, PHOS in the last 168 hours. Liver Function Tests: No results for input(s): AST, ALT, ALKPHOS, BILITOT, PROT, ALBUMIN in the last 168 hours. No results for input(s): LIPASE, AMYLASE in the last 168 hours. No results for input(s): AMMONIA in the last 168 hours. CBC: No results for input(s): WBC, NEUTROABS, HGB, HCT, MCV, PLT in the last 168 hours. Cardiac Enzymes: No results for input(s): CKTOTAL, CKMB, CKMBINDEX, TROPONINI in the last 168 hours. BNP (last 3 results) No results for input(s): BNP in the last 8760 hours.  ProBNP (last 3 results) No results for input(s): PROBNP in the last 8760 hours.  CBG: Recent Labs  Lab 12/26/19 1602 12/26/19 2008 12/26/19 2304 12/27/19 0318  12/27/19 0719  GLUCAP 118* 103* 86 133* 118*    No results found for this or any previous visit (from the past 240 hour(s)).   Studies: No results found.  Scheduled Meds: . acetaminophen (TYLENOL) oral liquid 160 mg/5 mL  650 mg Per Tube Q6H  . amLODipine  10 mg Per Tube Daily  . baclofen  10 mg Per Tube TID  . chlorhexidine gluconate (MEDLINE KIT)  15 mL Mouth Rinse BID  . Darunavir-Cobicisctat-Emtricitabine-Tenofovir Alafenamide  1 tablet Oral Q breakfast  . diclofenac Sodium  4 g Topical QID  . feeding supplement (PROSource TF)  45 mL Per Tube TID  . folic acid  1 mg Per Tube Daily  . free water  200 mL Per Tube Q4H  . heparin injection (subcutaneous)  5,000 Units Subcutaneous Q8H  . levETIRAcetam  1,500 mg Per Tube BID  . nutrition supplement (JUVEN)  1 packet Per Tube  BID BM  . nystatin   Topical TID  . QUEtiapine  25 mg Oral QHS  . scopolamine  1 patch Transdermal Q72H  . sulfamethoxazole-trimethoprim  1 tablet Per Tube Once per day on Mon Wed Fri  . thiamine  100 mg Per Tube Daily   Continuous Infusions: . feeding supplement (OSMOLITE 1.5 CAL) 1,000 mL (12/26/19 0024)    Active Problems:   Cardiac arrest ([place])   Community acquired pneumonia of left upper lobe of lung   Anoxic brain injury ([place])   Alcohol abuse   Acute respiratory failure with hypoxia ([facility])   Vomiting   Pressure injury of skin   Small bowel obstruction ([place])   Palliative care encounter   Bowel obstruction ([facility])   Chronic respiratory failure ([place])   Diastolic dysfunction   Tracheostomy dependent ([place])   Dysphagia   Protein-calorie malnutrition ([place])   Seizures ([place])   Bilateral recurrent inguinal hernia   Consultants:  PCCM  Palliative medicine  General surgery  Neurology  Interventional radiology  Procedures:  EEG  Echocardiogram  Tracheostomy  Antibiotics: Anti-infectives (From admission, onward)   Start     Dose/Rate Route Frequency Ordered Stop   12/19/19 0930   Darunavir-Cobicisctat-Emtricitabine-Tenofovir Alafenamide (SYMTUZA) 800-150-200-10 MG TABS 1 tablet        1 tablet Oral Daily with breakfast 12/19/19 0840     12/13/19 1000  bictegravir-emtricitabine-tenofovir AF (BIKTARVY) 50-200-25 MG per tablet 1 tablet  Status:  Discontinued        1 tablet Oral Daily 12/12/19 1413 12/19/19 0840   11/17/19 1548  ceFAZolin (ANCEF) 2-4 GM/100ML-% IVPB       Note to Pharmacy: Domenick Bookbinder   : cabinet override      11/17/19 1548 11/18/19 0359   11/16/19 1515  ceFAZolin (ANCEF) IVPB 2g/100 mL premix        2 g 200 mL/hr over 30 Minutes Intravenous To Radiology 11/16/19 1511 11/17/19 1625   11/15/19 0900  sulfamethoxazole-trimethoprim (BACTRIM DS) 800-160 MG per tablet 1 tablet        1 tablet Per Tube Once per day on Mon Wed Fri 11/13/19 1906     11/13/19 1615  dolutegravir (TIVICAY) tablet 50 mg  Status:  Discontinued        50 mg Oral Daily 11/13/19 1521 12/12/19 1413   11/13/19 1615  emtricitabine-tenofovir AF (DESCOVY) 200-25 MG per tablet 1 tablet  Status:  Discontinued        1 tablet Per Tube Daily 11/13/19 1521 12/12/19 1413   11/13/19 1530  sulfamethoxazole-trimethoprim (BACTRIM DS) 800-160 MG per tablet 1 tablet  Status:  Discontinued        1 tablet Oral Once per day on Mon Wed Fri 11/13/19 1441 11/13/19 1906   11/13/19 1500  bictegravir-emtricitabine-tenofovir AF (BIKTARVY) 50-200-25 MG per tablet 1 tablet  Status:  Discontinued        1 tablet Oral Daily 11/13/19 1403 11/13/19 1521   11/10/19 1000  erythromycin 250 mg in sodium chloride 0.9 % 100 mL IVPB        250 mg 100 mL/hr over 60 Minutes Intravenous Every 8 hours 11/10/19 0907 11/11/19 2000   11/07/19 1200  ceFEPIme (MAXIPIME) 2 g in sodium chloride 0.9 % 100 mL IVPB        2 g 200 mL/hr over 30 Minutes Intravenous Every 8 hours 11/07/19 1013 11/13/19 2127   11/03/19 1200  cefTRIAXone (ROCEPHIN) 2 g in sodium chloride 0.9 % 100 mL IVPB  2 g 200 mL/hr over 30 Minutes Intravenous  Every 24 hours 11/03/19 0922 11/05/19 1427   11/02/19 0800  vancomycin (VANCOREADY) IVPB 750 mg/150 mL  Status:  Discontinued        750 mg 150 mL/hr over 60 Minutes Intravenous Every 12 hours 11/01/19 1935 11/02/19 1105   11/02/19 0600  piperacillin-tazobactam (ZOSYN) IVPB 3.375 g  Status:  Discontinued        3.375 g 12.5 mL/hr over 240 Minutes Intravenous Every 8 hours 11/02/19 0311 11/03/19 0920   11/01/19 2200  ceFEPIme (MAXIPIME) 2 g in sodium chloride 0.9 % 100 mL IVPB  Status:  Discontinued        2 g 200 mL/hr over 30 Minutes Intravenous Every 12 hours 11/01/19 2143 11/02/19 0311   11/01/19 1915  vancomycin (VANCOREADY) IVPB 1500 mg/300 mL        1,500 mg 150 mL/hr over 120 Minutes Intravenous  Once 11/01/19 1909 11/01/19 2147   11/01/19 1845  cefTRIAXone (ROCEPHIN) 1 g in sodium chloride 0.9 % 100 mL IVPB        1 g 200 mL/hr over 30 Minutes Intravenous  Once 11/01/19 1842 11/01/19 2051   11/01/19 1845  azithromycin (ZITHROMAX) 500 mg in sodium chloride 0.9 % 250 mL IVPB        500 mg 250 mL/hr over 60 Minutes Intravenous  Once 11/01/19 1842 11/01/19 2051       Time spent: 20 minutes    Erin Hearing ANP  [facility] Pager [phone]. If 7PM-7AM, please contact night-coverage at [email or web address] 12/27/2019, 10:56 AM  LOS: 56 days

## 2019-12-27 NOTE — Plan of Care (Signed)
  Problem: Education: Goal: Knowledge of General Education information will improve Description: Including pain rating scale, medication(s)/side effects and non-pharmacologic comfort measures Outcome: Not Progressing   Problem: Health Behavior/Discharge Planning: Goal: Ability to manage health-related needs will improve Outcome: Not Progressing   Problem: Clinical Measurements: Goal: Ability to maintain clinical measurements within normal limits will improve Outcome: Not Progressing Goal: Will remain free from infection Outcome: Not Progressing Goal: Diagnostic test results will improve Outcome: Not Progressing Goal: Respiratory complications will improve Outcome: Not Progressing Goal: Cardiovascular complication will be avoided Outcome: Not Progressing   Problem: Activity: Goal: Risk for activity intolerance will decrease Outcome: Not Progressing   Problem: Nutrition: Goal: Adequate nutrition will be maintained Outcome: Not Progressing   Problem: Coping: Goal: Level of anxiety will decrease Outcome: Not Progressing   Problem: Elimination: Goal: Will not experience complications related to bowel motility Outcome: Not Progressing Goal: Will not experience complications related to urinary retention Outcome: Not Progressing   Problem: Pain Managment: Goal: General experience of comfort will improve Outcome: Not Progressing   Problem: Safety: Goal: Ability to remain free from injury will improve Outcome: Not Progressing   Problem: Skin Integrity: Goal: Risk for impaired skin integrity will decrease Outcome: Not Progressing   Problem: Education: Goal: 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 Outcome: Not Progressing   Problem: Activity: Goal: Ability to tolerate increased activity will improve Outcome: Not Progressing   Problem: Health Behavior/Discharge Planning: Goal: 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 Outcome: Not Progressing    Problem: Respiratory: Goal: Patent airway maintenance will improve Outcome: Not Progressing   Problem: Role Relationship: Goal: Ability to communicate will improve Outcome: Not Progressing

## 2019-12-27 NOTE — Progress Notes (Signed)
Patient has continued to be agitated tonight, pulling tubes and lines. He was placed in restraints last night. For patient's safety, will need to continue soft restraints for now; will continue frequent assessments and remove as soon as possible. Pt seen at bedside and pt agitated and does not follow commands

## 2019-12-27 NOTE — Plan of Care (Signed)
  Problem: Education: Goal: Knowledge of General Education information will improve Description: Including pain rating scale, medication(s)/side effects and non-pharmacologic comfort measures Outcome: Progressing   Problem: Health Behavior/Discharge Planning: Goal: Ability to manage health-related needs will improve Outcome: Progressing   Problem: Clinical Measurements: Goal: Ability to maintain clinical measurements within normal limits will improve Outcome: Progressing Goal: Will remain free from infection Outcome: Progressing Goal: Diagnostic test results will improve Outcome: Progressing Goal: Respiratory complications will improve Outcome: Progressing Goal: Cardiovascular complication will be avoided Outcome: Progressing   Problem: Activity: Goal: Risk for activity intolerance will decrease Outcome: Progressing   Problem: Nutrition: Goal: Adequate nutrition will be maintained Outcome: Progressing   Problem: Coping: Goal: Level of anxiety will decrease Outcome: Progressing   Problem: Elimination: Goal: Will not experience complications related to bowel motility Outcome: Progressing Goal: Will not experience complications related to urinary retention Outcome: Progressing   Problem: Pain Managment: Goal: General experience of comfort will improve Outcome: Progressing   Problem: Safety: Goal: Ability to remain free from injury will improve Outcome: Progressing   Problem: Skin Integrity: Goal: Risk for impaired skin integrity will decrease Outcome: Progressing   Problem: Education: Goal: 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 Outcome: Progressing   Problem: Activity: Goal: Ability to tolerate increased activity will improve Outcome: Progressing   Problem: Health Behavior/Discharge Planning: Goal: 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 Outcome: Progressing   Problem: Respiratory: Goal: Patent airway maintenance will  improve Outcome: Progressing   Problem: Role Relationship: Goal: Ability to communicate will improve Outcome: Progressing   

## 2019-12-27 NOTE — Progress Notes (Signed)
Cross-coverage note.   Patient has been agitated tonight, pulling tubes and lines, lost IV, had cord wrapped around his neck at one point. He is unable to indicate any specific need. Pain is being addressed. Non-pharmcalogic interventions have been unsuccessful. Haldol was not successful. For patient's safety, will need to use soft restraints for now; will continue frequent assessments and remove as soon as possible.

## 2019-12-28 LAB — GLUCOSE, CAPILLARY
Glucose-Capillary: 100 mg/dL — ABNORMAL HIGH (ref 70–99)
Glucose-Capillary: 114 mg/dL — ABNORMAL HIGH (ref 70–99)
Glucose-Capillary: 116 mg/dL — ABNORMAL HIGH (ref 70–99)
Glucose-Capillary: 118 mg/dL — ABNORMAL HIGH (ref 70–99)
Glucose-Capillary: 123 mg/dL — ABNORMAL HIGH (ref 70–99)
Glucose-Capillary: 136 mg/dL — ABNORMAL HIGH (ref 70–99)

## 2019-12-28 MED ORDER — BISACODYL 10 MG RE SUPP: 10.0000 mg | Freq: Every day | RECTAL | Status: DC | PRN

## 2019-12-28 MED ORDER — LORAZEPAM 2 MG/ML IJ SOLN
2.0000 mg | Freq: Once | INTRAMUSCULAR | Status: AC
  Administered 2019-12-28: 2 mg via INTRAMUSCULAR
  Filled 2019-12-28: qty 1

## 2019-12-28 NOTE — Progress Notes (Addendum)
Patient has become agitated at night for 2 in a row.  Will order PRN bladder scan and check to see when patient's last BM was to r/o fecal impaction causing agitation since patient not able to fully express needs.  Sleepy this AM- got 2 mg of ativan.  Will also check labs as not done in 1 month. [name] DO

## 2019-12-28 NOTE — Progress Notes (Signed)
Spoke with Dr. Rachael Darby regarding patient very agitated and fighting the restraints and restless in bed. Orders will be placed by Dr. Rachael Darby.

## 2019-12-28 NOTE — Plan of Care (Signed)
  Problem: Education: Goal: Knowledge of General Education information will improve Description: Including pain rating scale, medication(s)/side effects and non-pharmacologic comfort measures Outcome: Progressing   Problem: Health Behavior/Discharge Planning: Goal: Ability to manage health-related needs will improve Outcome: Progressing   Problem: Clinical Measurements: Goal: Ability to maintain clinical measurements within normal limits will improve Outcome: Progressing Goal: Will remain free from infection Outcome: Progressing Goal: Diagnostic test results will improve Outcome: Progressing Goal: Respiratory complications will improve Outcome: Progressing Goal: Cardiovascular complication will be avoided Outcome: Progressing   Problem: Activity: Goal: Risk for activity intolerance will decrease Outcome: Progressing   Problem: Nutrition: Goal: Adequate nutrition will be maintained Outcome: Progressing   Problem: Coping: Goal: Level of anxiety will decrease Outcome: Progressing   Problem: Elimination: Goal: Will not experience complications related to bowel motility Outcome: Progressing Goal: Will not experience complications related to urinary retention Outcome: Progressing   Problem: Pain Managment: Goal: General experience of comfort will improve Outcome: Progressing   Problem: Safety: Goal: Ability to remain free from injury will improve Outcome: Progressing   Problem: Skin Integrity: Goal: Risk for impaired skin integrity will decrease Outcome: Progressing   Problem: Education: Goal: 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 Outcome: Progressing   Problem: Activity: Goal: Ability to tolerate increased activity will improve Outcome: Progressing   Problem: Health Behavior/Discharge Planning: Goal: 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 Outcome: Progressing   Problem: Respiratory: Goal: Patent airway maintenance will  improve Outcome: Progressing   Problem: Role Relationship: Goal: Ability to communicate will improve Outcome: Progressing   

## 2019-12-28 NOTE — Progress Notes (Addendum)
Patient with continued agitation. For patient's safety, will need to continue soft restraints for now; will continue frequent assessments and remove as soon as possible.does not follow commands

## 2019-12-29 LAB — BASIC METABOLIC PANEL WITH GFR
Anion gap: 13 (ref 5–15)
BUN: 23 mg/dL — ABNORMAL HIGH (ref 6–20)
CO2: 23 mmol/L (ref 22–32)
Calcium: 9.7 mg/dL (ref 8.9–10.3)
Chloride: 97 mmol/L — ABNORMAL LOW (ref 98–111)
Creatinine, Ser: 0.76 mg/dL (ref 0.61–1.24)
GFR, Estimated: >60 mL/min
Glucose, Bld: 114 mg/dL — ABNORMAL HIGH (ref 70–99)
Potassium: 3.8 mmol/L (ref 3.5–5.1)
Sodium: 133 mmol/L — ABNORMAL LOW (ref 135–145)

## 2019-12-29 LAB — GLUCOSE, CAPILLARY
Glucose-Capillary: 106 mg/dL — ABNORMAL HIGH (ref 70–99)
Glucose-Capillary: 112 mg/dL — ABNORMAL HIGH (ref 70–99)
Glucose-Capillary: 113 mg/dL — ABNORMAL HIGH (ref 70–99)
Glucose-Capillary: 114 mg/dL — ABNORMAL HIGH (ref 70–99)
Glucose-Capillary: 117 mg/dL — ABNORMAL HIGH (ref 70–99)
Glucose-Capillary: 128 mg/dL — ABNORMAL HIGH (ref 70–99)
Glucose-Capillary: 91 mg/dL (ref 70–99)

## 2019-12-29 LAB — CBC
HCT: 43.1 % (ref 39.0–52.0)
Hemoglobin: 14.2 g/dL (ref 13.0–17.0)
MCH: 31.2 pg (ref 26.0–34.0)
MCHC: 32.9 g/dL (ref 30.0–36.0)
MCV: 94.7 fL (ref 80.0–100.0)
Platelets: 137 10*3/uL — ABNORMAL LOW (ref 150–400)
RBC: 4.55 MIL/uL (ref 4.22–5.81)
RDW: 13.2 % (ref 11.5–15.5)
WBC: 2.9 10*3/uL — ABNORMAL LOW (ref 4.0–10.5)
nRBC: 0 % (ref 0.0–0.2)

## 2019-12-29 MED ORDER — FREE WATER
150.0000 mL | Status: DC
  Administered 2019-12-29 – 2020-01-09 (×66): 150 mL

## 2019-12-29 MED ORDER — QUETIAPINE FUMARATE 25 MG PO TABS
25.0000 mg | ORAL_TABLET | Freq: Every day | ORAL | Status: DC
  Administered 2019-12-29 – 2020-01-03 (×6): 25 mg
  Filled 2019-12-29 (×6): qty 1

## 2019-12-29 NOTE — Progress Notes (Signed)
Physical Therapy Treatment Patient Details Name: Ryan Orozco MRN: [id number] DOB: 1965/06/22 Today's Date: 12/29/2019    History of Present Illness 54 year old male found down outside convenience store 9/27. Pt with PEA and hypoxic brain injury. Trach 10/13, PEG 10/15. PMHx: HIV AIDS and alcohol abuse    PT Comments    Pt awake with intermittent smiling and daughter present. Pt with bil wrist restraints during session and reapplied at end of session. Pt with increased flexor tone in LLE today along with maintained RLE tone. Pt with passive stretch into extension for bil LE with grimace but able to tolerate motion. Pt able to sit EOB today with assist but not following commands for bil LE HEP or sitting balance. Will continue to follow and progress.     Follow Up Recommendations  SNF;Supervision/Assistance - 24 hour     Equipment Recommendations  Wheelchair (measurements PT);Wheelchair cushion (measurements PT);Hospital bed;Other (comment)    Recommendations for Other Services       Precautions / Restrictions Precautions Precautions: Fall Precaution Comments: trach, PEG, anoxic, flexor tone R LE    Mobility  Bed Mobility Overal bed mobility: Needs Assistance Bed Mobility: Supine to Sit;Sit to Supine     Supine to sit: Total assist Sit to supine: Total assist   General bed mobility comments: total assist to pivot to EOB and back with limitation from bil LE flexor tone with mobility and flexed posture. total +2 assist to slide toward [facility] end of session and placed in semichair position in bed  Transfers                 General transfer comment: unable  Ambulation/Gait                 Stairs             Wheelchair Mobility    Modified Rankin (Stroke Patients Only)       Balance Overall balance assessment: Needs assistance Sitting-balance support: No upper extremity supported;Feet supported Sitting balance-Leahy Scale: Zero Sitting  balance - Comments: sitting EOB 8 min with mod assist with flexed trunk and sway without lurching today. pt with bil LE flexor tone in sitting unable to perform bil LE HEP                                    Cognition Arousal/Alertness: Awake/alert Behavior During Therapy: Flat affect Overall Cognitive Status: Difficult to assess Area of Impairment: Following commands               Rancho Levels of Cognitive Functioning [place]ant Scales of Cognitive Functioning: Localized response       Following Commands: Follows one step commands inconsistently;Follows one step commands with increased time       General Comments: pt alert and making sounds today but unintelligible. Pt following command to squeeze hand with delay but not for mobility or moving legs as he has done in prior sessions      Exercises      General Comments        Pertinent Vitals/Pain Pain Assessment: Faces Pain Score: 3  Pain Location: with R Knee extension Pain Descriptors / Indicators: Grimacing Pain Intervention(s): Limited activity within patient's tolerance;Repositioned    Home Living                      Prior Function  PT Goals (current goals can now be found in the care plan section) Progress towards PT goals: Not progressing toward goals - comment    Frequency    Min 2X/week      PT Plan Current plan remains appropriate    Co-evaluation              AM-PAC PT "6 Clicks" Mobility   Outcome Measure  Help needed turning from your back to your side while in a flat bed without using bedrails?: Total Help needed moving from lying on your back to sitting on the side of a flat bed without using bedrails?: Total Help needed moving to and from a bed to a chair (including a wheelchair)?: Total Help needed standing up from a chair using your arms (e.g., wheelchair or bedside chair)?: Total Help needed to walk in hospital room?: Total Help  needed climbing 3-5 steps with a railing? : Total 6 Click Score: 6    End of Session Equipment Utilized During Treatment: Oxygen Activity Tolerance: Patient tolerated treatment well Patient left: in bed;with call bell/phone within reach;with bed alarm set;with family/visitor present Nurse Communication: Mobility status PT Visit Diagnosis: Other abnormalities of gait and mobility (R26.89);Muscle weakness (generalized) (M62.81)     Time: 3888-2800 PT Time Calculation (min) (ACUTE ONLY): 27 min  Charges:  $Therapeutic Activity: 23-37 mins                     Margarett Viti P, PT Acute Rehabilitation Services Pager: [phone] Office: [phone]    Khara Renaud B Macrae Wiegman 12/29/2019, 1:47 PM

## 2019-12-29 NOTE — Plan of Care (Signed)
  Problem: Education: Goal: Knowledge of General Education information will improve Description: Including pain rating scale, medication(s)/side effects and non-pharmacologic comfort measures Outcome: Progressing   Problem: Health Behavior/Discharge Planning: Goal: Ability to manage health-related needs will improve Outcome: Progressing   Problem: Clinical Measurements: Goal: Ability to maintain clinical measurements within normal limits will improve Outcome: Progressing Goal: Will remain free from infection Outcome: Progressing Goal: Diagnostic test results will improve Outcome: Progressing Goal: Respiratory complications will improve Outcome: Progressing Goal: Cardiovascular complication will be avoided Outcome: Progressing   Problem: Activity: Goal: Risk for activity intolerance will decrease Outcome: Progressing   Problem: Nutrition: Goal: Adequate nutrition will be maintained Outcome: Progressing   Problem: Coping: Goal: Level of anxiety will decrease Outcome: Progressing   Problem: Elimination: Goal: Will not experience complications related to bowel motility Outcome: Progressing Goal: Will not experience complications related to urinary retention Outcome: Progressing   Problem: Pain Managment: Goal: General experience of comfort will improve Outcome: Progressing   Problem: Safety: Goal: Ability to remain free from injury will improve Outcome: Progressing   Problem: Skin Integrity: Goal: Risk for impaired skin integrity will decrease Outcome: Progressing   Problem: Education: Goal: 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 Outcome: Progressing   Problem: Activity: Goal: Ability to tolerate increased activity will improve Outcome: Progressing   Problem: Health Behavior/Discharge Planning: Goal: 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 Outcome: Progressing   Problem: Respiratory: Goal: Patent airway maintenance will  improve Outcome: Progressing   Problem: Role Relationship: Goal: Ability to communicate will improve Outcome: Progressing   

## 2019-12-29 NOTE — Progress Notes (Signed)
Patient became agitated again last night.  +BM on 11/25.  Labs with mildly low WBC and plts.  No other abnormalities except mild thrombocytopenia.  May need to consider further imaging such as CT scan of brain to determine why the change over last few night.  No fever although he is on schedule tylenol and ibuprofen. [name] DO

## 2019-12-30 LAB — GLUCOSE, CAPILLARY
Glucose-Capillary: 100 mg/dL — ABNORMAL HIGH (ref 70–99)
Glucose-Capillary: 104 mg/dL — ABNORMAL HIGH (ref 70–99)
Glucose-Capillary: 108 mg/dL — ABNORMAL HIGH (ref 70–99)
Glucose-Capillary: 110 mg/dL — ABNORMAL HIGH (ref 70–99)
Glucose-Capillary: 113 mg/dL — ABNORMAL HIGH (ref 70–99)
Glucose-Capillary: 119 mg/dL — ABNORMAL HIGH (ref 70–99)

## 2019-12-30 NOTE — Progress Notes (Addendum)
Patient w/o any issues overnight.  Smiling and interactive/comfortbale this AM. Continue to monitor closely including temperature.  IF has fever despite being on tylenol and ibuprofen scheduled will need U/A +/- chest x ray (no increasing O2 requirements)  [name] DO

## 2019-12-30 NOTE — Plan of Care (Signed)

## 2019-12-31 LAB — GLUCOSE, CAPILLARY
Glucose-Capillary: 101 mg/dL — ABNORMAL HIGH (ref 70–99)
Glucose-Capillary: 101 mg/dL — ABNORMAL HIGH (ref 70–99)
Glucose-Capillary: 108 mg/dL — ABNORMAL HIGH (ref 70–99)
Glucose-Capillary: 102 mg/dL — ABNORMAL HIGH (ref 70–99)
Glucose-Capillary: 118 mg/dL — ABNORMAL HIGH (ref 70–99)
Glucose-Capillary: 118 mg/dL — ABNORMAL HIGH (ref 70–99)

## 2019-12-31 NOTE — Progress Notes (Signed)
Patient on the LLS team.  No further periods of agitation overnight. Smiling this AM.  Temperature stable.   [name] DO

## 2020-01-01 LAB — GLUCOSE, CAPILLARY
Glucose-Capillary: 123 mg/dL — ABNORMAL HIGH (ref 70–99)
Glucose-Capillary: 113 mg/dL — ABNORMAL HIGH (ref 70–99)
Glucose-Capillary: 116 mg/dL — ABNORMAL HIGH (ref 70–99)
Glucose-Capillary: 147 mg/dL — ABNORMAL HIGH (ref 70–99)
Glucose-Capillary: 128 mg/dL — ABNORMAL HIGH (ref 70–99)
Glucose-Capillary: 118 mg/dL — ABNORMAL HIGH (ref 70–99)

## 2020-01-01 NOTE — Plan of Care (Signed)
  Problem: Education: Goal: Knowledge of General Education information will improve Description: Including pain rating scale, medication(s)/side effects and non-pharmacologic comfort measures Outcome: Progressing   Problem: Health Behavior/Discharge Planning: Goal: Ability to manage health-related needs will improve Outcome: Progressing   Problem: Clinical Measurements: Goal: Ability to maintain clinical measurements within normal limits will improve Outcome: Progressing Goal: Will remain free from infection Outcome: Progressing Goal: Diagnostic test results will improve Outcome: Progressing Goal: Respiratory complications will improve Outcome: Progressing Goal: Cardiovascular complication will be avoided Outcome: Progressing   Problem: Activity: Goal: Risk for activity intolerance will decrease Outcome: Progressing   Problem: Nutrition: Goal: Adequate nutrition will be maintained Outcome: Progressing   Problem: Coping: Goal: Level of anxiety will decrease Outcome: Progressing   Problem: Elimination: Goal: Will not experience complications related to bowel motility Outcome: Progressing Goal: Will not experience complications related to urinary retention Outcome: Progressing   Problem: Pain Managment: Goal: General experience of comfort will improve Outcome: Progressing   Problem: Safety: Goal: Ability to remain free from injury will improve Outcome: Progressing   Problem: Skin Integrity: Goal: Risk for impaired skin integrity will decrease Outcome: Progressing   Problem: Education: Goal: 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 Outcome: Progressing   Problem: Activity: Goal: Ability to tolerate increased activity will improve Outcome: Progressing   Problem: Health Behavior/Discharge Planning: Goal: 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 Outcome: Progressing   Problem: Respiratory: Goal: Patent airway maintenance will  improve Outcome: Progressing   Problem: Role Relationship: Goal: Ability to communicate will improve Outcome: Progressing   

## 2020-01-01 NOTE — Progress Notes (Signed)
[name], MRN:  [id number], DOB:  12/30/1965, LOS: 61 ADMISSION DATE:  11/01/2019, CONSULTATION DATE:  9/29 REFERRING MD:  EDP, CHIEF COMPLAINT:  Cardiac arrest   Brief History   54 y/o male with HIV found down outside a convenience store on 9/27, received out of hospital CPR.  After ICU admission has had acute encephalopathy, concern for anoxic brain injury.  Received a tracheostomy 10/13.    Past Medical History  ETOH Abuse HIV - dx 1995  Significant Hospital Events   9/29 Admit post PEA arrest.  UDS positive for THC, benzo's.  ETOH 177 10/05 EEG ongoing. Versed restarted overnight due to agitation. Nicardipine increased.  10/06 Developed tachypnea with WUA, no follow commands off sedation  10/07 Vomiting, TF held / restarted with recurrent vomiting 10/16 tracheostomy collar for 9 hours 10/18->10/19 off vent all night  10/20> TC 11/15>>#4 shiley cuffless 11/22 start capping trials Consults:  Neurology  Procedures:  ETT 9/29 >>10/13 Tracheostomy>>10/3  Significant Diagnostic Tests:  CT head 9/29 >  No evidence of acute intracranial abnormality. Moderate generalized cerebral atrophy, advanced for age. Chronic medially displaced fracture deformity of the right lamina papyracea. Mild ethmoid and maxillary sinus mucosal thickening.  CT cervical spine 9/29 > No evidence of acute fracture to the cervical spine. Partially imaged airspace disease within the imaged lung apices (extensive on the left). Clinical correlation is recommended. 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  EEG 9/29 > Patient was noted to have frequent episodes of axial jerking with eye opening. Concomitant EEG showed generalized polyspikes consistent with myoclonic seizures. EEG also showed continuous generalized background suppression. EEG was not reactive to tactile stimulation. Hyperventilation and photic stimulation  were not performed  ECHO 9/30 > No evidence of wall motion abnormality  MRI 10/3 > Mild DWI hyperintensity involving the caudate and possibly putamen bilaterally. Additionally, possible subtle FLAIR hyperintensity involving the cerebellum, cortex diffusely, and bilateral basal ganglia. Although not diagnostic, these findings could be seen with early hypoxic/ischemic brain injury in this patient status post PEA arrest.   EEG 10/08> severe diffuse encephalopathy likely related to anoxic/hypoxic brain injury  Testicular US 10/10> showed large left hydrocele and left inguinal hernia. No evidence of testicular mass.  MRI brain 10/11: evolving hypoxic, anoxic brain injury compared to previous MRI   CT abdomen pelvis 10/12: Right inguinal hernia and partial small bowel obstruction, large left inguinal hernia  Abdominal x-ray: Small bowel obstruction  Micro Data:  BCx2 9/29 >> negative  Urine 9/29 >> UA few bacteria, WBC, RBC, CaOxalate, and Mucus Covid, Flu A/B 9/29 >> negative Tracheal aspirate 10/5 >> normal flora   Antimicrobials:  Vancomycin 9/29 >> 9/30 Cefepime 9/29 >> 9/30 Cefepime 10/5 >> 10/11  Interim history/subjective:  Tolerating ATC however copious secretions. Intermittently interactive with some following of commands  Objective   Blood pressure (!) 159/76, pulse 96, temperature 99.1 F (37.3 C), temperature source Oral, resp. rate 18, height 5\' 6"  (1.676 m), weight 73.9 kg, SpO2 95 %.    FiO2 (%):  [21 %-28 %] 21 %   Intake/Output Summary (Last 24 hours) at 01/01/2020 1906 Last data filed at 01/01/2020 1817 Gross per 24 hour  Intake --  Output 1670 ml  Net -1670 ml   Filed Weights   12/27/19 0118 12/31/19 2357 01/01/20 0105  Weight: 74.4 kg 73.9 kg 73.9 kg   Physical Exam: General: Chronically ill appearing, encephalopathic male laying  in bed, no acute distress HEENT: #4 shiley uncuffed, tracheal secretions present Respiratory: Coarse breath sounds. No  wheezing or rhonchi. 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 S/p trach due to failure to wean from ventilator.  Trach in place for airway protection Anoxic brain injury secondary to cardiac arrest c/b myoclonic seizures P: Tolerating trach collar however mental status/encephalopathy precludes safety for unsupervised capping. Secretions also are a limitation. He is likely trach dependent at this time as I believe his anoxic brain injury is contributing to his current state. Speech does document improved following of some commands which is hopeful but he will still likely to remain trach dependent for the next several months if he does make a slow recovery. --Continue Speech with supervised PMSV.  --Hold on capping trials  Rest of patient's chronic and acute medical conditions managed by primary  Pulmonary team will follow weekly  Critical care time: n/a minutes   Care Time: 15 min  Mechele Collin, M.D. [facility] Pulmonary/Critical Care Medicine 01/01/2020 7:22 PM

## 2020-01-01 NOTE — Progress Notes (Signed)
[facility] PROGRESS NOTE  [place] WER:[id number] DOB: 07-26-1965 DOA: 11/01/2019 PCP: Default, Provider, MD    11/16   Status: Remains inpatient appropriate because:Altered mental status, Unsafe d/c plan and Inpatient level of care appropriate due to severity of illness. Patient newly diagnosed with anoxic brain injury  Dispo: The patient is from: Home              Anticipated d/c is to: SNF              Anticipated d/c date is: > 3 days              Patient currently is medically stable to d/c.  PMV training in process. Medicaid application in process. Patient will also need to have disability paperwork completed since do not expect full recovery.  Trach Tube has been in place greater than 30 days.   Code Status: Full Family Communication: 11/24 daughter at bedside DVT prophylaxis: Subcutaneous heparin Vaccination status: Does not appear to have received Covid vaccine but will need to confirm with family  Foley catheter: No  HPI: 54 year old male with HIV, chronic alcohol abuse who was presented to the emergency department after being found unresponsive outside a convinient store.  EMS found him pulseless in PEA, unknown downtime, successfully resuscitated and brought to the emergency department.  UDS positive for THC/benzodiazepines.  CT head unremarkable.  Failed extubation trials due to severe anoxic brain injury and had tracheostomy placed.  PEG placed on 10/15.  Now the plan is for placement in a skilled nursing facility/LTAC.  Prolonged hospital course.  Difficult placement. TOC following  Significant Hospital Events   9/29 Admit post PEA arrest. UDS positive for THC, benzo's. ETOH 177 10/05 EEG ongoing. Versed restarted overnight due to agitation. Nicardipine increased.  10/06 Developed tachypnea with WUA, no follow commands off sedation  10/07 Vomiting, TF held / restarted with recurrent vomiting 10/16 tracheostomy collar for 9 hours 10/18->10/19 off vent  all night  10/20> TC 11/15 > downsized to #4 Shiley flex CFS  Subjective: Awake; initially appeared to be uncomfortable but after more contact and talking with patient seemed to calm down.  He even smiled several times during my interaction.  Objective: Vitals:   01/01/20 0405 01/01/20 0804  BP: 129/76   Pulse: 97 92  Resp: 18 20  Temp: 97.8 F (36.6 C)   SpO2: 100% 96%    Intake/Output Summary (Last 24 hours) at 01/01/2020 1205 Last data filed at 01/01/2020 0401 Gross per 24 hour  Intake --  Output 1170 ml  Net -1170 ml   Filed Weights   12/27/19 0118 12/31/19 2357 01/01/20 0105  Weight: 74.4 kg 73.9 kg 73.9 kg    Exam: Constitutional: NAD, calm, peers to be comfortable Respiratory: Clear to auscultation bilaterally upon anterior auscultation, #4 cuffless trach with 21% FiO2/TC; minimal tracheal secretions which are managed by strong cough Cardiovascular: Regular rate and rhythm, skin warm and dry Abdomen: non tender, bowel sounds positive. PEG tube Musculoskeletal: Mild to moderate upper extremity dystonic movements with activity.  Extremity is very stiff noting right hip and knee flexed and due to stiffness and?  Pain and having difficulty extending leg from knee and at hip. Skin: Several decubiti as described below Neurologic: CN 2-12 grossly intact; Sensation intact both upper and lower extremities, difficult to test strength in upper extremities but appears to be 2/5 with lower extremities 2-3/5.   Psychiatric:  oriented times name.  Continues with happy affect   Assessment/Plan:  Acute problems: Anoxic brain injury 2/2 PEA arrest:  -Presumed 2/2 combination alcohol and BZD overdose with UDS positive for THC and BZDs -Anticipate will not recover fully from this event/recover to previous baseline therefore long-term SNF placement recommended -Continue PT/OT/SLP-PT and OT made aware of stiffness in right leg and are planning on working with patient to help prevent  contracture. -continue baclofen to 10 mg TID for dystonic movements -Scheduled Tylenol for any pain associated with dystonic movements -Having some nocturnal behavioral issues and agitation therefore have added Seroquel 25 mg hs  Acute hypoxemic respiratory failure/new tracheostomy tube requirement -Continue PMV training per SLP -#4 cuffless trach-capping trial at discretion of trach team -Tracheostomy tube placed on 10/3   Pain syndrome -Continue Voltaren gel and scheduled Tylenol -pain for the most part seems to be controlled with scheduled regimen -No Ultram can [place] to seizure activity at time of admission -Completed 5 days of scheduled ibuprofen  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   -Continue Keppra-levetiracetam level 35.9 on 11/12 -no further seizure activity since transitioned out of ICU setting  Dysphagia 2/2 anoxic brain injury -Continue tube feeding per PEG -Repeat SLP evaluation on 11/23 with recommendation for n.p.o. status  Goals of care:  -Palliative care was involved during this hospitalization.   -Goals of care were discussed. remains full code.  - Palliative care recommended outpatient follow-up with palliative providers.  Protein calorie malnutrition nutrition Status: Nutrition Problem: Increased nutrient needs Etiology: acute illness Signs/Symptoms: estimated needs Interventions: Tube feeding consisting of Osmolite 1.5 at 60 cc/h, Juven twice daily, Prosource TF 45 mL 3 times daily Estimated body mass index is 26.29 kg/m as calculated from the following:   Height as of this encounter: $RemoveBeforeD'5\' 6"'JOErcXTUDOFchj$  (1.676 m).   Weight as of this encounter: 73.9 kg.   Multiple decubiti not POA Pressure Injury 11/13/19 Anus Stage 3 -  Full thickness tissue loss. Subcutaneous fat may be visible but bone, tendon or muscle are NOT exposed. (Active)  Date First Assessed/Time First Assessed: 11/13/19 1000   Location: Anus  Staging: Stage 3 -  Full thickness tissue  loss. Subcutaneous fat may be visible but bone, tendon or muscle are NOT exposed.  Present on Admission: No    Assessments 11/17/2019  8:00 AM 01/01/2020  9:15 AM  Dressing Type None --  Dressing -- Clean;Dry;New drainage  State of Healing -- Scar  Site / Wound Assessment Pink --  Peri-wound Assessment Intact Intact  Drainage Amount None --     No Linked orders to display     Wound / Incision (Open or Dehisced) 11/17/19 Puncture Abdomen Left;Anterior;Upper G-tube insertion site  (Active)  Date First Assessed/Time First Assessed: 11/17/19 1646   Wound Type: Puncture  Location: Abdomen  Location Orientation: Left;Anterior;Upper  Wound Description (Comments): G-tube insertion site   Present on Admission: No    Assessments 11/17/2019  4:47 PM 12/27/2019 10:00 AM  Dressing Type Gauze (Comment);Tape dressing --  Dressing Changed New --  Dressing Status Clean;Dry;Intact --  Dressing Change Frequency PRN --  Site / Wound Assessment Clean;Dry;Pink --  Peri-wound Assessment Intact --  Wound Length (cm) -- 0 cm  Wound Width (cm) -- 0 cm  Wound Depth (cm) -- 0 cm  Wound Volume (cm^3) -- 0 cm^3  Wound Surface Area (cm^2) -- 0 cm^2  Closure None --  Drainage Amount None --  Treatment Cleansed --     No Linked orders to display     Pressure Injury 12/01/19 Ankle  Left;Lateral Stage 2 -  Partial thickness loss of dermis presenting as a shallow open injury with a red, pink wound bed without slough. (Active)  Date First Assessed/Time First Assessed: 12/01/19 2002   Location: Ankle  Location Orientation: Left;Lateral  Staging: Stage 2 -  Partial thickness loss of dermis presenting as a shallow open injury with a red, pink wound bed without slough.    Assessments 12/01/2019  7:52 PM 01/01/2020  9:15 AM  Dressing Type Foam - Lift dressing to assess site every shift --  Dressing Clean;Dry;Intact Clean;Dry;Intact  Site / Wound Assessment Pink Clean;Pink  Peri-wound Assessment Intact --  Margins  Unattached edges (unapproximated) --  Drainage Amount Scant --  Drainage Description Serosanguineous --  Treatment Cleansed --     No Linked orders to display      Other problems: Hypertension/grade 1 diastolic dysfunction:  -Blood pressure stable on amlodipine  -Echocardiogram this admission with preserved LVEF with evidence of grade 1 diastolic dysfunction. No physiology currently consistent with heart failure  HIV:  -CD4 count of 124.  -Dc'd Tivicay and [place] infavor of Biktarvy to for eventual dc to SNF -Continue prophylactic Bactrim  Inguinal hernias -Evaluated by general surgery this admission. Documented as chronically incarcerated. Patient has been clinically stable and tolerating tube feedings and having bowel movements so unless patient obstructed would not be considered a candidate for repair. Even if obstructed consulting surgeon was not sure he would be a candidate for hernia repair regardless .   Data Reviewed: Basic Metabolic Panel: Recent Labs  Lab 12/29/19 0138  NA 133*  K 3.8  CL 97*  CO2 23  GLUCOSE 114*  BUN 23*  CREATININE 0.76  CALCIUM 9.7   Liver Function Tests: No results for input(s): AST, ALT, ALKPHOS, BILITOT, PROT, ALBUMIN in the last 168 hours. No results for input(s): LIPASE, AMYLASE in the last 168 hours. No results for input(s): AMMONIA in the last 168 hours. CBC: Recent Labs  Lab 12/29/19 0138  WBC 2.9*  HGB 14.2  HCT 43.1  MCV 94.7  PLT 137*   Cardiac Enzymes: No results for input(s): CKTOTAL, CKMB, CKMBINDEX, TROPONINI in the last 168 hours. BNP (last 3 results) No results for input(s): BNP in the last 8760 hours.  ProBNP (last 3 results) No results for input(s): PROBNP in the last 8760 hours.  CBG: Recent Labs  Lab 12/31/19 1630 12/31/19 1941 12/31/19 2310 01/01/20 0307 01/01/20 0924  GLUCAP 102* 118* 118* 123* 113*    No results found for this or any previous visit (from the past 240 hour(s)).    Studies: No results found.  Scheduled Meds: . acetaminophen (TYLENOL) oral liquid 160 mg/5 mL  650 mg Per Tube Q6H  . amLODipine  10 mg Per Tube Daily  . baclofen  10 mg Per Tube TID  . chlorhexidine gluconate (MEDLINE KIT)  15 mL Mouth Rinse BID  . Darunavir-Cobicisctat-Emtricitabine-Tenofovir Alafenamide  1 tablet Oral Q breakfast  . diclofenac Sodium  4 g Topical QID  . famotidine  10.4 mg Per Tube BID  . feeding supplement (PROSource TF)  45 mL Per Tube TID  . folic acid  1 mg Per Tube Daily  . free water  150 mL Per Tube Q4H  . heparin injection (subcutaneous)  5,000 Units Subcutaneous Q8H  . levETIRAcetam  1,500 mg Per Tube BID  . nutrition supplement (JUVEN)  1 packet Per Tube BID BM  . nystatin   Topical TID  . QUEtiapine  25 mg Per  Tube QHS  . scopolamine  1 patch Transdermal Q72H  . sulfamethoxazole-trimethoprim  1 tablet Per Tube Once per day on Mon Wed Fri  . thiamine  100 mg Per Tube Daily   Continuous Infusions: . feeding supplement (OSMOLITE 1.5 CAL) 1,000 mL (12/28/19 1535)    Active Problems:   Cardiac arrest ([place])   Community acquired pneumonia of left upper lobe of lung   Anoxic brain injury ([place])   Alcohol abuse   Acute respiratory failure with hypoxia ([facility])   Vomiting   Pressure injury of skin   Small bowel obstruction ([place])   Palliative care encounter   Bowel obstruction ([facility])   Chronic respiratory failure ([place])   Diastolic dysfunction   Tracheostomy dependent ([place])   Dysphagia   Protein-calorie malnutrition ([place])   Seizures ([place])   Bilateral recurrent inguinal hernia   Consultants:  PCCM  Palliative medicine  General surgery  Neurology  Interventional radiology  Procedures:  EEG  Echocardiogram  Tracheostomy  Antibiotics: Anti-infectives (From admission, onward)   Start     Dose/Rate Route Frequency Ordered Stop   12/19/19 0930  Darunavir-Cobicisctat-Emtricitabine-Tenofovir Alafenamide (SYMTUZA) 800-150-200-10 MG TABS 1  tablet        1 tablet Oral Daily with breakfast 12/19/19 0840     12/13/19 1000  bictegravir-emtricitabine-tenofovir AF (BIKTARVY) 50-200-25 MG per tablet 1 tablet  Status:  Discontinued        1 tablet Oral Daily 12/12/19 1413 12/19/19 0840   11/17/19 1548  ceFAZolin (ANCEF) 2-4 GM/100ML-% IVPB       Note to Pharmacy: Domenick Bookbinder   : cabinet override      11/17/19 1548 11/18/19 0359   11/16/19 1515  ceFAZolin (ANCEF) IVPB 2g/100 mL premix        2 g 200 mL/hr over 30 Minutes Intravenous To Radiology 11/16/19 1511 11/17/19 1625   11/15/19 0900  sulfamethoxazole-trimethoprim (BACTRIM DS) 800-160 MG per tablet 1 tablet        1 tablet Per Tube Once per day on Mon Wed Fri 11/13/19 1906     11/13/19 1615  dolutegravir (TIVICAY) tablet 50 mg  Status:  Discontinued        50 mg Oral Daily 11/13/19 1521 12/12/19 1413   11/13/19 1615  emtricitabine-tenofovir AF (DESCOVY) 200-25 MG per tablet 1 tablet  Status:  Discontinued        1 tablet Per Tube Daily 11/13/19 1521 12/12/19 1413   11/13/19 1530  sulfamethoxazole-trimethoprim (BACTRIM DS) 800-160 MG per tablet 1 tablet  Status:  Discontinued        1 tablet Oral Once per day on Mon Wed Fri 11/13/19 1441 11/13/19 1906   11/13/19 1500  bictegravir-emtricitabine-tenofovir AF (BIKTARVY) 50-200-25 MG per tablet 1 tablet  Status:  Discontinued        1 tablet Oral Daily 11/13/19 1403 11/13/19 1521   11/10/19 1000  erythromycin 250 mg in sodium chloride 0.9 % 100 mL IVPB        250 mg 100 mL/hr over 60 Minutes Intravenous Every 8 hours 11/10/19 0907 11/11/19 2000   11/07/19 1200  ceFEPIme (MAXIPIME) 2 g in sodium chloride 0.9 % 100 mL IVPB        2 g 200 mL/hr over 30 Minutes Intravenous Every 8 hours 11/07/19 1013 11/13/19 2127   11/03/19 1200  cefTRIAXone (ROCEPHIN) 2 g in sodium chloride 0.9 % 100 mL IVPB        2 g 200 mL/hr over 30 Minutes Intravenous Every 24  hours 11/03/19 0922 11/05/19 1427   11/02/19 0800  vancomycin (VANCOREADY) IVPB  750 mg/150 mL  Status:  Discontinued        750 mg 150 mL/hr over 60 Minutes Intravenous Every 12 hours 11/01/19 1935 11/02/19 1105   11/02/19 0600  piperacillin-tazobactam (ZOSYN) IVPB 3.375 g  Status:  Discontinued        3.375 g 12.5 mL/hr over 240 Minutes Intravenous Every 8 hours 11/02/19 0311 11/03/19 0920   11/01/19 2200  ceFEPIme (MAXIPIME) 2 g in sodium chloride 0.9 % 100 mL IVPB  Status:  Discontinued        2 g 200 mL/hr over 30 Minutes Intravenous Every 12 hours 11/01/19 2143 11/02/19 0311   11/01/19 1915  vancomycin (VANCOREADY) IVPB 1500 mg/300 mL        1,500 mg 150 mL/hr over 120 Minutes Intravenous  Once 11/01/19 1909 11/01/19 2147   11/01/19 1845  cefTRIAXone (ROCEPHIN) 1 g in sodium chloride 0.9 % 100 mL IVPB        1 g 200 mL/hr over 30 Minutes Intravenous  Once 11/01/19 1842 11/01/19 2051   11/01/19 1845  azithromycin (ZITHROMAX) 500 mg in sodium chloride 0.9 % 250 mL IVPB        500 mg 250 mL/hr over 60 Minutes Intravenous  Once 11/01/19 1842 11/01/19 2051       Time spent: 20 minutes    Erin Hearing ANP  [facility] Pager [phone]. If 7PM-7AM, please contact night-coverage at [email or web address] 01/01/2020, 12:05 PM  LOS: 61 days

## 2020-01-01 NOTE — Progress Notes (Signed)
Nutrition Follow-up  DOCUMENTATION CODES:   Not applicable  INTERVENTION:  Continue tube feeds via PEG: -Osmolite 1.5@ 3m/hr (1440 ml/day) - ProSourceTF 45 mlTID - Juven BID -Free waterflushes 152m 4hours  Tube feeding regimen provides 2470 kcals, 128 grams protein,109724mree water (1997m37mth flushes).    NUTRITION DIAGNOSIS:   Increased nutrient needs related to acute illness as evidenced by estimated needs.  ongoing  GOAL:   Patient will meet greater than or equal to 90% of their needs  Met with TF  MONITOR:   Labs, Weight trends, TF tolerance, Skin, I & O's  REASON FOR ASSESSMENT:   Ventilator, Consult Enteral/tube feeding initiation and management  ASSESSMENT:   Patient with PMH significant for HIV and ETOH use. Presents this admission with cardiac arrest and suspected aspiration in setting of heavy ETOH use.  10/07- vomiting, TF held, laterrestarted with recurrent vomiting 10/12-R/L inguinal hernia, partial SBO  10/13- trach 10/15-PEG 10/18- ATC  Per MD, pt is medically stable to d/c. PMV training in process. Pt continues to receive TF via PEG. RN reports pt tolerating TF well. Current TF: Osmolite 1.5 @ 60 ml/hr, ProSource TF 45 ml TID, free water flushes of 150 ml q 4 hours. Will continue with current nutrition plan of care.   Admit wt: 78.9 kg Current wt: 73.9 kg   UOP: 1170ml62m hours  Labs: Na 133 (L) Medications: pepcid, folvite, juven BID, thiamine  Diet Order:   Diet Order            Diet NPO time specified  Diet effective midnight                 EDUCATION NEEDS:   Not appropriate for education at this time  Skin:  Skin Assessment: Skin Integrity Issues: Skin Integrity Issues:: Stage II, Stage III, Other (Comment) Stage II: L ankle Stage III: anus Other: puncture abdomen (PEG insertion site)  Last BM:  11/20  Height:   Ht Readings from Last 1 Encounters:  11/01/19 5' 6"  (1.676 m)     Weight:   Wt Readings from Last 1 Encounters:  01/01/20 73.9 kg    BMI:  Body mass index is 26.29 kg/m.  Estimated Nutritional Needs:   Kcal:  2300-1021-1173tein:  115-130 g  Fluid:  >/= 2 L/day    AmandLarkin Ina RD, LDN RD pager number and weekend/on-call pager number located in [email or web address]

## 2020-01-02 LAB — GLUCOSE, CAPILLARY
Glucose-Capillary: 89 mg/dL (ref 70–99)
Glucose-Capillary: 138 mg/dL — ABNORMAL HIGH (ref 70–99)
Glucose-Capillary: 138 mg/dL — ABNORMAL HIGH (ref 70–99)
Glucose-Capillary: 124 mg/dL — ABNORMAL HIGH (ref 70–99)
Glucose-Capillary: 111 mg/dL — ABNORMAL HIGH (ref 70–99)
Glucose-Capillary: 121 mg/dL — ABNORMAL HIGH (ref 70–99)

## 2020-01-02 MED ORDER — BACLOFEN 10 MG PO TABS
15.0000 mg | ORAL_TABLET | Freq: Three times a day (TID) | ORAL | Status: DC
  Administered 2020-01-02 – 2020-01-03 (×5): 15 mg
  Filled 2020-01-02 (×5): qty 2

## 2020-01-02 NOTE — Plan of Care (Signed)
  Problem: Health Behavior/Discharge Planning: Goal: Ability to manage health-related needs will improve Outcome: Progressing   Problem: Clinical Measurements: Goal: Ability to maintain clinical measurements within normal limits will improve Outcome: Progressing Goal: Will remain free from infection Outcome: Progressing Goal: Diagnostic test results will improve Outcome: Progressing Goal: Respiratory complications will improve Outcome: Progressing Goal: Cardiovascular complication will be avoided Outcome: Progressing   Problem: Nutrition: Goal: Adequate nutrition will be maintained Outcome: Progressing   Problem: Coping: Goal: Level of anxiety will decrease Outcome: Progressing   Problem: Elimination: Goal: Will not experience complications related to bowel motility Outcome: Progressing Goal: Will not experience complications related to urinary retention Outcome: Progressing   Problem: Pain Managment: Goal: General experience of comfort will improve Outcome: Progressing   Problem: Safety: Goal: Ability to remain free from injury will improve Outcome: Progressing   Problem: Skin Integrity: Goal: Risk for impaired skin integrity will decrease Outcome: Progressing   Problem: Education: Goal: 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 Outcome: Progressing   Problem: Activity: Goal: Ability to tolerate increased activity will improve Outcome: Progressing   Problem: Health Behavior/Discharge Planning: Goal: 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 Outcome: Progressing   Problem: Respiratory: Goal: Patent airway maintenance will improve Outcome: Progressing   Problem: Role Relationship: Goal: Ability to communicate will improve Outcome: Progressing

## 2020-01-02 NOTE — Progress Notes (Signed)
Physical Therapy Treatment Patient Details Name: Ryan Orozco MRN: [id number] DOB: Jul 18, 1965 Today's Date: 01/02/2020    History of Present Illness 54 year old male found down outside convenience store 9/27. Pt with PEA and hypoxic brain injury. Trach 10/13, PEG 10/15. PMHx: HIV AIDS and alcohol abuse    PT Comments    Pt shows significant increase in LE tone R>L knee flexion and sustained overpressure was needed to get pt into full knee extension, but R knee went back into flexion after overpressure was removed. Pt continues to be total assist +2 for mobility. While sitting EOB, pt demonstrated L hip ER tone and knee flexion tone and R hip and knee flexion tone, as well as L and R elbow flexion tone. Pt had decreased responsiveness to commands for LE exercises this session and cues for posture and he continued to decrease in responsiveness at the end of EOB sitting.   Follow Up Recommendations  SNF;Supervision/Assistance - 24 hour     Equipment Recommendations  Wheelchair (measurements PT);Wheelchair cushion (measurements PT);Hospital bed;Other (comment)    Recommendations for Other Services       Precautions / Restrictions Precautions Precautions: Fall Precaution Comments: trach, PEG, anoxic, flexor tone R>L LE Restrictions Weight Bearing Restrictions: No    Mobility  Bed Mobility Overal bed mobility: Needs Assistance Bed Mobility: Supine to Sit;Sit to Supine     Supine to sit: Total assist;+2 for physical assistance Sit to supine: Total assist;+2 for physical assistance   General bed mobility comments: pt tried to initiate movement with supine to sit; pt was also total assist +2 for scooting towards [facility]  Transfers                 General transfer comment: unable  Ambulation/Gait             General Gait Details: unable   Stairs             Wheelchair Mobility    Modified Rankin (Stroke Patients Only)       Balance Overall  balance assessment: Needs assistance Sitting-balance support: No upper extremity supported;Feet supported Sitting balance-Leahy Scale: Zero Sitting balance - Comments: sitting EOB 13 minutes and was min assist except when he was lurching, he was mod assist. pt came into a flexed posture at the end of setting EOB; pt performed L LE HEP with mod assist +2 Postural control: Left lateral lean;Right lateral lean;Posterior lean     Standing balance comment: unable to assess                            Cognition Arousal/Alertness: Awake/alert Behavior During Therapy: Flat affect Overall Cognitive Status: Impaired/Different from baseline Area of Impairment: Following commands;Safety/judgement;Awareness;Attention;Problem solving               Rancho Levels of Cognitive Functioning Rancho Los Amigos Scales of Cognitive Functioning: Localized response   Current Attention Level: Focused   Following Commands: Follows one step commands inconsistently;Follows one step commands with increased time Safety/Judgement: Decreased awareness of deficits;Decreased awareness of safety Awareness: Intellectual Problem Solving: Slow processing;Requires tactile cues;Requires verbal cues;Decreased initiation;Difficulty sequencing General Comments: pt alert and making sounds today but unintelligible, but became less alert at end of session. Pt with decreased ability to follow commands for LE as compared to prior sessions      Exercises General Exercises - Upper Extremity Elbow Extension: PROM;Both;5 reps;Seated General Exercises - Lower Extremity Quad Sets: PROM;Both;Supine;Other (comment) (sustained stretch 3x2  minutes on the R, 1 time on the L for about 1 minute) Short Arc Quad: AROM;Strengthening;Left;5 reps;Seated    General Comments        Pertinent Vitals/Pain Pain Assessment: Faces Faces Pain Scale: Hurts even more Pain Location: with R>L knee extension Pain Descriptors /  Indicators: Grimacing Pain Intervention(s): Monitored during session;Limited activity within patient's tolerance;Repositioned    Home Living                      Prior Function            PT Goals (current goals can now be found in the care plan section) Progress towards PT goals: Not progressing toward goals - comment (increased flexor tone all extremities and decreased responsiveness)    Frequency    Min 2X/week      PT Plan Current plan remains appropriate    Co-evaluation              AM-PAC PT "6 Clicks" Mobility   Outcome Measure  Help needed turning from your back to your side while in a flat bed without using bedrails?: Total Help needed moving from lying on your back to sitting on the side of a flat bed without using bedrails?: Total Help needed moving to and from a bed to a chair (including a wheelchair)?: Total Help needed standing up from a chair using your arms (e.g., wheelchair or bedside chair)?: Total Help needed to walk in hospital room?: Total Help needed climbing 3-5 steps with a railing? : Total 6 Click Score: 6    End of Session Equipment Utilized During Treatment: Oxygen Activity Tolerance: Patient tolerated treatment well Patient left: in bed;with call bell/phone within reach;with bed alarm set;with family/visitor present Nurse Communication: Mobility status PT Visit Diagnosis: Other abnormalities of gait and mobility (R26.89);Muscle weakness (generalized) (M62.81)     Time: 9485-4627 PT Time Calculation (min) (ACUTE ONLY): 51 min  Charges:  $Therapeutic Exercise: 8-22 mins $Therapeutic Activity: 23-37 mins                     Ryan Orozco, SPT [id number]  Ryan Orozco 01/02/2020, 11:43 AM

## 2020-01-02 NOTE — Progress Notes (Addendum)
[facility] PROGRESS NOTE  Kaniel [place] DVV:[id number] DOB: 11-23-1965 DOA: 11/01/2019 PCP: Default, Provider, MD    11/16   Status: Remains inpatient appropriate because:Altered mental status, Unsafe d/c plan and Inpatient level of care appropriate due to severity of illness. Patient newly diagnosed with anoxic brain injury  Dispo: The patient is from: Home              Anticipated d/c is to: SNF              Anticipated d/c date is: > 3 days              Patient currently is medically stable to d/c.  PMV training in process. Medicaid application in process. Patient will also need to have disability paperwork completed since do not expect full recovery.  Trach Tube has been in place greater than 30 days.   Code Status: Full Family Communication: 11/24 daughter at bedside; she is in agreement for patient being placed locally in SNF and is hopeful that over time he can be transferred to an SNF in the [place] area DVT prophylaxis: Subcutaneous heparin Vaccination status: Does not appear to have received Covid vaccine but will need to confirm with family  Foley catheter: No  HPI: 54 year old male with HIV, chronic alcohol abuse who was presented to the emergency department after being found unresponsive outside a convinient store.  EMS found him pulseless in PEA, unknown downtime, successfully resuscitated and brought to the emergency department.  UDS positive for THC/benzodiazepines.  CT head unremarkable.  Failed extubation trials due to severe anoxic brain injury and had tracheostomy placed.  PEG placed on 10/15.  Now the plan is for placement in a skilled nursing facility/LTAC.  Prolonged hospital course.  Difficult placement. TOC following  Significant Hospital Events   9/29 Admit post PEA arrest. UDS positive for THC, benzo's. ETOH 177 10/05 EEG ongoing. Versed restarted overnight due to agitation. Nicardipine increased.  10/06 Developed tachypnea with WUA, no follow  commands off sedation  10/07 Vomiting, TF held / restarted with recurrent vomiting 10/16 tracheostomy collar for 9 hours 10/18->10/19 off vent all night  10/20> TC 11/15 > downsized to #4 Shiley flex CFS  Subjective: Awake; somewhat restless today.  When asked denied pain.  Was having difficulty attempting to verbally communicate and was unable to repeat back his first name.  Objective: Vitals:   01/02/20 0507 01/02/20 0742  BP: 121/64   Pulse: 84 79  Resp: 18 16  Temp: 99 F (37.2 C)   SpO2: 98% 100%    Intake/Output Summary (Last 24 hours) at 01/02/2020 1015 Last data filed at 01/02/2020 0515 Gross per 24 hour  Intake 22425 ml  Output 1320 ml  Net 21105 ml   Filed Weights   01/01/20 0105 01/01/20 2229 01/02/20 0114  Weight: 73.9 kg 73.2 kg 73.2 kg    Exam: Constitutional: NAD, mildly restless Respiratory: Clear to auscultation bilaterally upon anterior auscultation, #4 cuffless trach with 21% FiO2/TC; modest tracheal secretions which are managed by strong cough Cardiovascular: Regular rate and rhythm, skin warm and dry Abdomen: non tender, bowel sounds positive. PEG tube with tube feeding Musculoskeletal: Mild to moderate upper extremity dystonic movements with activity.  Keeps legs flexed at hips right more so than left. Skin: Several decubiti as described below Neurologic: CN 2-12 grossly intact; Sensation intact both upper and lower extremities, difficult to test strength in upper extremities but appears to be 2/5 with lower extremities 2-3/5.   Psychiatric:  oriented times name.  Restless.   Assessment/Plan: Acute problems: Anoxic brain injury 2/2 PEA arrest:  -Presumed 2/2 combination alcohol and BZD overdose with UDS positive for THC and BZDs -Anticipate will not recover fully from this event/recover to previous baseline therefore long-term SNF placement recommended -Continue PT/OT/SLP-PT and OT made aware of stiffness in right leg and are planning on working  with patient to help prevent contracture. -11/30 Increase baclofen to 15 mg TID for dystonic movements and increased muscle tone in LEs-monitor for AMS-if ineffective or AMS may need to change to Skelaxin -Continue scheduled Tylenol for any pain associated with dystonic movements -No reports of recurrent nocturnal behavioral issues/agitation since Seroquel 25 mg hs added  Acute hypoxemic respiratory failure/new tracheostomy tube requirement -Continue PMV training per SLP -#4 cuffless trach-capping trial at discretion of trach team -Tracheostomy tube placed on 10/3  -11/29 was evaluated by trach team and due to persistent encephalopathy and mild to modest secretions currently not a candidate for consideration for decannulation.  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   -Continue Keppra-levetiracetam level 35.9 on 11/12 -no further seizure activity since transitioned out of ICU setting  Dysphagia 2/2 anoxic brain injury -Continue tube feeding per PEG -Repeat SLP evaluation on 11/23 with recommendation for n.p.o. status  Goals of care:  -Palliative care was involved during this hospitalization.   -Goals of care were discussed. remains full code.  - Palliative care recommended outpatient follow-up with palliative providers.  Protein calorie malnutrition nutrition Status: Nutrition Problem: Increased nutrient needs Etiology: acute illness Signs/Symptoms: estimated needs Interventions: Tube feeding consisting of Osmolite 1.5 at 60 cc/h, Juven twice daily, Prosource TF 45 mL 3 times daily Estimated body mass index is 26.03 kg/m as calculated from the following:   Height as of this encounter: _0   (1.676 m).   Weight as of this encounter: 73.2 kg.   Multiple decubiti not POA Pressure Injury 11/13/19 Anus Stage 3 -  Full thickness tissue loss. Subcutaneous fat may be visible but bone, tendon or muscle are NOT exposed. (Active)  Date First Assessed/Time First Assessed: 11/13/19 1000   Location: Anus  Staging: Stage 3 -  Full thickness tissue loss. Subcutaneous fat may be visible but bone, tendon or muscle are NOT exposed.  Present on Admission: No    Assessments 11/17/2019  8:00 AM 01/01/2020  9:15 AM  Dressing Type None --  Dressing -- Clean;Dry;New drainage  State of Healing -- Scar  Site / Wound Assessment Pink --  Peri-wound Assessment Intact Intact  Drainage Amount None --     No Linked orders to display     Wound / Incision (Open or Dehisced) 11/17/19 Puncture Abdomen Left;Anterior;Upper G-tube insertion site  (Active)  Date First Assessed/Time First Assessed: 11/17/19 1646   Wound Type: Puncture  Location: Abdomen  Location Orientation: Left;Anterior;Upper  Wound Description (Comments): G-tube insertion site   Present on Admission: No    Assessments 11/17/2019  4:47 PM 12/27/2019 10:00 AM  Dressing Type Gauze (Comment);Tape dressing --  Dressing Changed New --  Dressing Status Clean;Dry;Intact --  Dressing Change Frequency PRN --  Site / Wound Assessment Clean;Dry;Pink --  Peri-wound Assessment Intact --  Wound Length (cm) -- 0 cm  Wound Width (  cm) -- 0 cm  Wound Depth (cm) -- 0 cm  Wound Volume (cm^3) -- 0 cm^3  Wound Surface Area (cm^2) -- 0 cm^2  Closure None --  Drainage Amount None --  Treatment Cleansed --     No Linked orders to display     Pressure Injury 12/01/19 Ankle Left;Lateral Stage 2 -  Partial thickness loss of dermis presenting as a shallow open injury with a red, pink wound bed without slough. (Active)  Date First Assessed/Time First Assessed: 12/01/19 2002   Location: Ankle  Location Orientation: Left;Lateral  Staging: Stage 2 -  Partial thickness  loss of dermis presenting as a shallow open injury with a red, pink wound bed without slough.    Assessments 12/01/2019  7:52 PM 01/01/2020  9:15 AM  Dressing Type Foam - Lift dressing to assess site every shift --  Dressing Clean;Dry;Intact Clean;Dry;Intact  Site / Wound Assessment Pink Clean;Pink  Peri-wound Assessment Intact --  Margins Unattached edges (unapproximated) --  Drainage Amount Scant --  Drainage Description Serosanguineous --  Treatment Cleansed --     No Linked orders to display      Other problems: Hypertension/grade 1 diastolic dysfunction:  -Blood pressure stable on amlodipine  -Echocardiogram this admission with preserved LVEF with evidence of grade 1 diastolic dysfunction. No physiology currently consistent with heart failure  HIV:  -CD4 count of 124.  -Dc'd Tivicay and Descovy infavor of Biktarvy to for eventual dc to SNF -Continue prophylactic Bactrim  Inguinal hernias -Evaluated by general surgery this admission. Documented as chronically incarcerated. Patient has been clinically stable and tolerating tube feedings and having bowel movements so unless patient obstructed would not be considered a candidate for repair. Even if obstructed consulting surgeon was not sure he would be a candidate for hernia repair regardless .   Data Reviewed: Basic Metabolic Panel: Recent Labs  Lab 12/29/19 0138  NA 133*  K 3.8  CL 97*  CO2 23  GLUCOSE 114*  BUN 23*  CREATININE 0.76  CALCIUM 9.7   Liver Function Tests: No results for input(s): AST, ALT, ALKPHOS, BILITOT, PROT, ALBUMIN in the last 168 hours. No results for input(s): LIPASE, AMYLASE in the last 168 hours. No results for input(s): AMMONIA in the last 168 hours. CBC: Recent Labs  Lab 12/29/19 0138  WBC 2.9*  HGB 14.2  HCT 43.1  MCV 94.7  PLT 137*   Cardiac Enzymes: No results for input(s): CKTOTAL, CKMB, CKMBINDEX, TROPONINI in the last 168 hours. BNP (last 3 results) No results for  input(s): BNP in the last 8760 hours.  ProBNP (last 3 results) No results for input(s): PROBNP in the last 8760 hours.  CBG: Recent Labs  Lab 01/01/20 1300 01/01/20 1527 01/01/20 2139 01/01/20 2314 01/02/20 0323  GLUCAP 116* 147* 128* 118* 89    No results found for this or any previous visit (from the past 240 hour(s)).   Studies: No results found.  Scheduled Meds: . acetaminophen (TYLENOL) oral liquid 160 mg/5 mL  650 mg Per Tube Q6H  . amLODipine  10 mg Per Tube Daily  . baclofen  10 mg Per Tube TID  . chlorhexidine gluconate (MEDLINE KIT)  15 mL Mouth Rinse BID  . Darunavir-Cobicisctat-Emtricitabine-Tenofovir Alafenamide  1 tablet Oral Q breakfast  . diclofenac Sodium  4 g Topical QID  . famotidine  10.4 mg Per Tube BID  . feeding supplement (PROSource TF)  45 mL Per Tube TID  . folic acid  1 mg Per Tube  Daily  . free water  150 mL Per Tube Q4H  . heparin injection (subcutaneous)  5,000 Units Subcutaneous Q8H  . levETIRAcetam  1,500 mg Per Tube BID  . nutrition supplement (JUVEN)  1 packet Per Tube BID BM  . nystatin   Topical TID  . QUEtiapine  25 mg Per Tube QHS  . scopolamine  1 patch Transdermal Q72H  . sulfamethoxazole-trimethoprim  1 tablet Per Tube Once per day on Mon Wed Fri  . thiamine  100 mg Per Tube Daily   Continuous Infusions: . feeding supplement (OSMOLITE 1.5 CAL) 1,000 mL (01/01/20 2148)    Active Problems:   Cardiac arrest [facility])   Community acquired pneumonia of left upper lobe of lung   Anoxic brain injury ([place])   Alcohol abuse   Acute respiratory failure with hypoxia ([facility])   Vomiting   Pressure injury of skin   Small bowel obstruction ([place])   Palliative care encounter   Bowel obstruction ([facility])   Chronic respiratory failure ([place])   Diastolic dysfunction   Tracheostomy dependent ([place])   Dysphagia   Protein-calorie malnutrition ([place])   Seizures ([place])   Bilateral recurrent inguinal hernia   Consultants:  PCCM  Palliative  medicine  General surgery  Neurology  Interventional radiology  Procedures:  EEG  Echocardiogram  Tracheostomy  Antibiotics: Anti-infectives (From admission, onward)   Start     Dose/Rate Route Frequency Ordered Stop   12/19/19 0930  Darunavir-Cobicisctat-Emtricitabine-Tenofovir Alafenamide (SYMTUZA) 800-150-200-10 MG TABS 1 tablet        1 tablet Oral Daily with breakfast 12/19/19 0840     12/13/19 1000  bictegravir-emtricitabine-tenofovir AF (BIKTARVY) 50-200-25 MG per tablet 1 tablet  Status:  Discontinued        1 tablet Oral Daily 12/12/19 1413 12/19/19 0840   11/17/19 1548  ceFAZolin (ANCEF) 2-4 GM/100ML-% IVPB       Note to Pharmacy: Domenick Bookbinder   : cabinet override      11/17/19 1548 11/18/19 0359   11/16/19 1515  ceFAZolin (ANCEF) IVPB 2g/100 mL premix        2 g 200 mL/hr over 30 Minutes Intravenous To Radiology 11/16/19 1511 11/17/19 1625   11/15/19 0900  sulfamethoxazole-trimethoprim (BACTRIM DS) 800-160 MG per tablet 1 tablet        1 tablet Per Tube Once per day on Mon Wed Fri 11/13/19 1906     11/13/19 1615  dolutegravir (TIVICAY) tablet 50 mg  Status:  Discontinued        50 mg Oral Daily 11/13/19 1521 12/12/19 1413   11/13/19 1615  emtricitabine-tenofovir AF (DESCOVY) 200-25 MG per tablet 1 tablet  Status:  Discontinued        1 tablet Per Tube Daily 11/13/19 1521 12/12/19 1413   11/13/19 1530  sulfamethoxazole-trimethoprim (BACTRIM DS) 800-160 MG per tablet 1 tablet  Status:  Discontinued        1 tablet Oral Once per day on Mon Wed Fri 11/13/19 1441 11/13/19 1906   11/13/19 1500  bictegravir-emtricitabine-tenofovir AF (BIKTARVY) 50-200-25 MG per tablet 1 tablet  Status:  Discontinued        1 tablet Oral Daily 11/13/19 1403 11/13/19 1521   11/10/19 1000  erythromycin 250 mg in sodium chloride 0.9 % 100 mL IVPB        250 mg 100 mL/hr over 60 Minutes Intravenous Every 8 hours 11/10/19 0907 11/11/19 2000   11/07/19 1200  ceFEPIme (MAXIPIME) 2 g in sodium  chloride 0.9 % 100 mL  IVPB        2 g 200 mL/hr over 30 Minutes Intravenous Every 8 hours 11/07/19 1013 11/13/19 2127   11/03/19 1200  cefTRIAXone (ROCEPHIN) 2 g in sodium chloride 0.9 % 100 mL IVPB        2 g 200 mL/hr over 30 Minutes Intravenous Every 24 hours 11/03/19 0922 11/05/19 1427   11/02/19 0800  vancomycin (VANCOREADY) IVPB 750 mg/150 mL  Status:  Discontinued        750 mg 150 mL/hr over 60 Minutes Intravenous Every 12 hours 11/01/19 1935 11/02/19 1105   11/02/19 0600  piperacillin-tazobactam (ZOSYN) IVPB 3.375 g  Status:  Discontinued        3.375 g 12.5 mL/hr over 240 Minutes Intravenous Every 8 hours 11/02/19 0311 11/03/19 0920   11/01/19 2200  ceFEPIme (MAXIPIME) 2 g in sodium chloride 0.9 % 100 mL IVPB  Status:  Discontinued        2 g 200 mL/hr over 30 Minutes Intravenous Every 12 hours 11/01/19 2143 11/02/19 0311   11/01/19 1915  vancomycin (VANCOREADY) IVPB 1500 mg/300 mL        1,500 mg 150 mL/hr over 120 Minutes Intravenous  Once 11/01/19 1909 11/01/19 2147   11/01/19 1845  cefTRIAXone (ROCEPHIN) 1 g in sodium chloride 0.9 % 100 mL IVPB        1 g 200 mL/hr over 30 Minutes Intravenous  Once 11/01/19 1842 11/01/19 2051   11/01/19 1845  azithromycin (ZITHROMAX) 500 mg in sodium chloride 0.9 % 250 mL IVPB        500 mg 250 mL/hr over 60 Minutes Intravenous  Once 11/01/19 1842 11/01/19 2051       Time spent: 20 minutes    Erin Hearing ANP  [facility] Pager [phone]. If 7PM-7AM, please contact night-coverage at [email or web address] 01/02/2020, 10:15 AM  LOS: 62 days

## 2020-01-03 LAB — GLUCOSE, CAPILLARY
Glucose-Capillary: 103 mg/dL — ABNORMAL HIGH (ref 70–99)
Glucose-Capillary: 109 mg/dL — ABNORMAL HIGH (ref 70–99)
Glucose-Capillary: 107 mg/dL — ABNORMAL HIGH (ref 70–99)
Glucose-Capillary: 130 mg/dL — ABNORMAL HIGH (ref 70–99)
Glucose-Capillary: 132 mg/dL — ABNORMAL HIGH (ref 70–99)
Glucose-Capillary: 112 mg/dL — ABNORMAL HIGH (ref 70–99)

## 2020-01-03 NOTE — Plan of Care (Signed)
  Problem: Clinical Measurements: Goal: Respiratory complications will improve Outcome: Progressing   Problem: Clinical Measurements: Goal: Cardiovascular complication will be avoided Outcome: Progressing   Problem: Respiratory: Goal: Patent airway maintenance will improve Outcome: Progressing

## 2020-01-03 NOTE — Progress Notes (Signed)
  Speech Language Pathology Treatment: Cognitive-Linquistic  Patient Details Name: Ryan Orozco MRN: [id number] DOB: 1965/03/20 Today's Date: 01/03/2020 Time: 4193-7902 SLP Time Calculation (min) (ACUTE ONLY): 15 min  Assessment / Plan / Recommendation Clinical Impression  Rashan was alert and appeared to be in discomfort this afternoon moaning frequently, RN aware. He wore PMV for approximately 5 minutes and imitated single word "bye" and attempted to round lips for "o". Increased frequency of eye contact with therapist today and appeared to have possibly improved focus/acuity (?). No approximations with counting. Therapist will focus on po trials during next session.    HPI HPI: 54 y/o male with hx of HIV, ETOH abuse found down outside a convenience store on 9/27, received out of hospital CPR. Presents with acute hypoxic respiratory failure s/p cardiac arrest, acute encephalopathy due to hypoxic brain injury. Intubated 9/27-10/13. Received a tracheostomy 10/13, treachestomy collar trial for 9 hours on 10/16. CXR on 10/17 improving and shows left lung opacities have essentially resolved      SLP Plan  Continue with current plan of care       Recommendations         Patient may use Passy-Muir Speech Valve: During all therapies with supervision PMSV Supervision: Full         Oral Care Recommendations: Oral care QID Follow up Recommendations: LTACH;Skilled Nursing facility SLP Visit Diagnosis: Cognitive communication deficit (I09.735) Plan: Continue with current plan of care                      Royce Macadamia 01/03/2020, 4:16 PM   [name].Ed Nurse, children's [phone] Office [phone]

## 2020-01-03 NOTE — Plan of Care (Signed)
  Problem: Education: Goal: Knowledge of General Education information will improve Description: Including pain rating scale, medication(s)/side effects and non-pharmacologic comfort measures Outcome: Progressing   Problem: Health Behavior/Discharge Planning: Goal: Ability to manage health-related needs will improve Outcome: Progressing   Problem: Clinical Measurements: Goal: Ability to maintain clinical measurements within normal limits will improve Outcome: Progressing Goal: Will remain free from infection Outcome: Progressing Goal: Diagnostic test results will improve Outcome: Progressing Goal: Respiratory complications will improve Outcome: Progressing Goal: Cardiovascular complication will be avoided Outcome: Progressing   Problem: Activity: Goal: Risk for activity intolerance will decrease Outcome: Progressing   Problem: Nutrition: Goal: Adequate nutrition will be maintained Outcome: Progressing   Problem: Coping: Goal: Level of anxiety will decrease Outcome: Progressing   Problem: Elimination: Goal: Will not experience complications related to bowel motility Outcome: Progressing Goal: Will not experience complications related to urinary retention Outcome: Progressing   Problem: Pain Managment: Goal: General experience of comfort will improve Outcome: Progressing   Problem: Safety: Goal: Ability to remain free from injury will improve Outcome: Progressing   Problem: Skin Integrity: Goal: Risk for impaired skin integrity will decrease Outcome: Progressing   Problem: Education: Goal: 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 Outcome: Progressing   Problem: Activity: Goal: Ability to tolerate increased activity will improve Outcome: Progressing   Problem: Health Behavior/Discharge Planning: Goal: 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 Outcome: Progressing   Problem: Respiratory: Goal: Patent airway maintenance will  improve Outcome: Progressing   Problem: Role Relationship: Goal: Ability to communicate will improve Outcome: Progressing   

## 2020-01-03 NOTE — Progress Notes (Addendum)
[facility] PROGRESS NOTE  Ryan Orozco OZH:[id number] DOB: March 08, 1965 DOA: 11/01/2019 PCP: Default, Provider, MD    11/16   Status: Remains inpatient appropriate because:Altered mental status, Unsafe d/c plan and Inpatient level of care appropriate due to severity of illness. Patient newly diagnosed with anoxic brain injury  Dispo: The patient is from: Home              Anticipated d/c is to: SNF              Anticipated d/c date is: > 3 days              Patient currently is medically stable to d/c.  PMV training in process. Medicaid application in process. Patient will also need to have disability paperwork completed since do not expect full recovery.  Trach Tube has been in place greater than 30 days.   Code Status: Full Family Communication: 11/24 daughter at bedside; she is in agreement for patient being placed locally in SNF and is hopeful that over time he can be transferred to an SNF in the [place] area DVT prophylaxis: Subcutaneous heparin Vaccination status: Does not appear to have received Covid vaccine but will need to confirm with family  Foley catheter: No  HPI: 54 year old male with HIV, chronic alcohol abuse who was presented to the emergency department after being found unresponsive outside a convinient store.  EMS found him pulseless in PEA, unknown downtime, successfully resuscitated and brought to the emergency department.  UDS positive for THC/benzodiazepines.  CT head unremarkable.  Failed extubation trials due to severe anoxic brain injury and had tracheostomy placed.  PEG placed on 10/15.    Significant Hospital Events   9/29 Admit post PEA arrest. UDS positive for THC, benzo's. ETOH 177 10/05 EEG ongoing. Versed restarted overnight due to agitation. Nicardipine increased.  10/06 Developed tachypnea with WUA, no follow commands off sedation  10/07 Vomiting, TF held / restarted with recurrent vomiting 10/16 tracheostomy collar for 9  hours 10/18->10/19 off vent all night  10/20> TC 11/15 > downsized to #4 Shiley flex CFS  Subjective: Awake; smiling intermittently.  Appears to be uncomfortable and it is noted that he is having rhythmic movements of his head and spasticity/stiffness of extremities  Objective: Vitals:   01/03/20 0744 01/03/20 1000  BP:    Pulse: (!) 117 98  Resp: 20 19  Temp:    SpO2: 97%     Intake/Output Summary (Last 24 hours) at 01/03/2020 1126 Last data filed at 01/03/2020 0500 Gross per 24 hour  Intake 1680 ml  Output 1450 ml  Net 230 ml   Filed Weights   01/02/20 0114 01/02/20 2153 01/03/20 0125  Weight: 73.2 kg 71.6 kg 71.6 kg    Exam: Constitutional: NAD, appears to be somewhat uncomfortable Respiratory: Clear to auscultation bilaterally upon anterior auscultation, #4 cuffless trach with 21% FiO2/TC; modest tracheal secretions  Cardiovascular: Regular rate and rhythm, skin warm and dry Abdomen: non tender, bowel sounds positive. PEG tube with tube feeding Musculoskeletal: Mild to moderate upper extremity dystonic movements with activity.  Keeps legs flexed at hips right more so than left. Skin: Several decubiti as described below Neurologic: CN 2-12 grossly intact; Sensation intact both upper and lower extremities, difficult to test strength in upper extremities but appears to be 2/5 with lower extremities 2-3/5.   Psychiatric:  oriented times name.  Restless.   Assessment/Plan: Acute problems: Anoxic brain injury 2/2 PEA arrest:  -Presumed 2/2 combination alcohol and BZD overdose  with UDS positive for THC and BZDs -Do not expect he will recover fully from this event therefore long-term SNF placement recommended -Continue PT/OT/SLP-PT and OT made aware of stiffness in right leg and are planning on working with patient to help prevent contracture. -11/30 Increased baclofen to 15 mg TID for dystonic movements and increased muscle tone in LEs-monitor for AMS-and appears to be  having hallucinations -Continue scheduled Tylenol for any pain associated with dystonic movements -No reports of recurrent nocturnal behavioral issues/agitation since Seroquel 25 mg hs added -Today bed has been repositioned in room to allow for patient to see outside of door into hallway  Acute hypoxemic respiratory failure/new tracheostomy tube requirement -Continue PMV training per SLP -#4 cuffless trach-capping trial at discretion of trach team -Tracheostomy tube placed on 10/3  -11/29 was evaluated by trach team and due to persistent encephalopathy and mild to modest secretions currently not a candidate for consideration for decannulation.  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   -Continue Keppra-levetiracetam level 35.9 on 11/12 -no further seizure activity since transitioned out of ICU setting  Dysphagia 2/2 anoxic brain injury -Continue tube feeding per PEG -Repeat SLP evaluation on 11/23 with recommendation for n.p.o. status  Goals of care:  -Palliative care was involved during this hospitalization.   -Goals of care were discussed. remains full code.  - Palliative care recommended outpatient follow-up with palliative providers.  Protein calorie malnutrition nutrition Status: Nutrition Problem: Increased nutrient needs Etiology: acute illness Signs/Symptoms: estimated needs Interventions: Tube feeding consisting of Osmolite 1.5 at 60 cc/h, Juven twice daily, Prosource TF 45 mL 3 times daily Estimated body mass index is 25.47 kg/m as calculated from the following:   Height as of this  encounter: 5' 6"  (1.676 m).   Weight as of this encounter: 71.6 kg.   Multiple decubiti not POA Pressure Injury 11/13/19 Anus Stage 3 -  Full thickness tissue loss. Subcutaneous fat may be visible but bone, tendon or muscle are NOT exposed. (Active)  Date First Assessed/Time First Assessed: 11/13/19 1000   Location: Anus  Staging: Stage 3 -  Full thickness tissue loss. Subcutaneous fat may be visible but bone, tendon or muscle are NOT exposed.  Present on Admission: No    Assessments 11/17/2019  8:00 AM 01/01/2020  9:15 AM  Dressing Type None --  Dressing -- Clean;Dry;New drainage  State of Healing -- Scar  Site / Wound Assessment Pink --  Peri-wound Assessment Intact Intact  Drainage Amount None --     No Linked orders to display     Wound / Incision (Open or Dehisced) 11/17/19 Puncture Abdomen Left;Anterior;Upper G-tube insertion site  (Active)  Date First Assessed/Time First Assessed: 11/17/19 1646   Wound Type: Puncture  Location: Abdomen  Location Orientation: Left;Anterior;Upper  Wound Description (Comments): G-tube insertion site   Present on Admission: No    Assessments 11/17/2019  4:47 PM 12/27/2019 10:00 AM  Dressing Type Gauze (Comment);Tape dressing --  Dressing Changed New --  Dressing Status Clean;Dry;Intact --  Dressing Change Frequency PRN --  Site / Wound Assessment Clean;Dry;Pink --  Peri-wound Assessment Intact --  Wound Length (cm) -- 0  cm  Wound Width (cm) -- 0 cm  Wound Depth (cm) -- 0 cm  Wound Volume (cm^3) -- 0 cm^3  Wound Surface Area (cm^2) -- 0 cm^2  Closure None --  Drainage Amount None --  Treatment Cleansed --     No Linked orders to display     Pressure Injury 12/01/19 Ankle Left;Lateral Stage 2 -  Partial thickness loss of dermis presenting as a shallow open injury with a red, pink wound bed without slough. (Active)  Date First Assessed/Time First Assessed: 12/01/19 2002   Location: Ankle  Location Orientation: Left;Lateral  Staging: Stage 2 -   Partial thickness loss of dermis presenting as a shallow open injury with a red, pink wound bed without slough.    Assessments 12/01/2019  7:52 PM 01/01/2020  9:15 AM  Dressing Type Foam - Lift dressing to assess site every shift --  Dressing Clean;Dry;Intact Clean;Dry;Intact  Site / Wound Assessment Pink Clean;Pink  Peri-wound Assessment Intact --  Margins Unattached edges (unapproximated) --  Drainage Amount Scant --  Drainage Description Serosanguineous --  Treatment Cleansed --     No Linked orders to display      Other problems: Hypertension/grade 1 diastolic dysfunction:  -Blood pressure stable on amlodipine  -Echocardiogram this admission with preserved LVEF with evidence of grade 1 diastolic dysfunction. No physiology currently consistent with heart failure  HIV:  -CD4 count of 124.  -Dc'd Tivicay and Descovy infavor of Biktarvy to for eventual dc to SNF -Continue prophylactic Bactrim  Inguinal hernias -Evaluated by general surgery this admission. Documented as chronically incarcerated. Patient has been clinically stable and tolerating tube feedings and having bowel movements so unless patient obstructed would not be considered a candidate for repair. Even if obstructed consulting surgeon was not sure he would be a candidate for hernia repair regardless .   Data Reviewed: Basic Metabolic Panel: Recent Labs  Lab 12/29/19 0138  NA 133*  K 3.8  CL 97*  CO2 23  GLUCOSE 114*  BUN 23*  CREATININE 0.76  CALCIUM 9.7   Liver Function Tests: No results for input(s): AST, ALT, ALKPHOS, BILITOT, PROT, ALBUMIN in the last 168 hours. No results for input(s): LIPASE, AMYLASE in the last 168 hours. No results for input(s): AMMONIA in the last 168 hours. CBC: Recent Labs  Lab 12/29/19 0138  WBC 2.9*  HGB 14.2  HCT 43.1  MCV 94.7  PLT 137*   Cardiac Enzymes: No results for input(s): CKTOTAL, CKMB, CKMBINDEX, TROPONINI in the last 168 hours. BNP (last 3  results) No results for input(s): BNP in the last 8760 hours.  ProBNP (last 3 results) No results for input(s): PROBNP in the last 8760 hours.  CBG: Recent Labs  Lab 01/02/20 1649 01/02/20 1958 01/02/20 2302 01/03/20 0327 01/03/20 0744  GLUCAP 124* 111* 121* 103* 109*    No results found for this or any previous visit (from the past 240 hour(s)).   Studies: No results found.  Scheduled Meds: . acetaminophen (TYLENOL) oral liquid 160 mg/5 mL  650 mg Per Tube Q6H  . amLODipine  10 mg Per Tube Daily  . baclofen  15 mg Per Tube TID  . chlorhexidine gluconate (MEDLINE KIT)  15 mL Mouth Rinse BID  . Darunavir-Cobicisctat-Emtricitabine-Tenofovir Alafenamide  1 tablet Oral Q breakfast  . diclofenac Sodium  4 g Topical QID  . famotidine  10.4 mg Per Tube BID  . feeding supplement (PROSource TF)  45 mL Per Tube TID  . folic acid  1 mg Per Tube Daily  . free water  150 mL Per Tube Q4H  . heparin injection (subcutaneous)  5,000 Units Subcutaneous Q8H  . levETIRAcetam  1,500 mg Per Tube BID  . nutrition supplement (JUVEN)  1 packet Per Tube BID BM  . nystatin   Topical TID  . QUEtiapine  25 mg Per Tube QHS  . scopolamine  1 patch Transdermal Q72H  . sulfamethoxazole-trimethoprim  1 tablet Per Tube Once per day on Mon Wed Fri  . thiamine  100 mg Per Tube Daily   Continuous Infusions: . feeding supplement (OSMOLITE 1.5 CAL) 1,000 mL (01/01/20 2148)    Active Problems:   Cardiac arrest [facility])   Community acquired pneumonia of left upper lobe of lung   Anoxic brain injury ([place])   Alcohol abuse   Acute respiratory failure with hypoxia ([facility])   Vomiting   Pressure injury of skin   Small bowel obstruction ([place])   Palliative care encounter   Bowel obstruction ([facility])   Chronic respiratory failure ([facility])   Diastolic dysfunction   Tracheostomy dependent ([place])   Dysphagia   Protein-calorie malnutrition ([place])   Seizures ([place])   Bilateral recurrent inguinal  hernia   Consultants:  PCCM  Palliative medicine  General surgery  Neurology  Interventional radiology  Procedures:  EEG  Echocardiogram  Tracheostomy  Antibiotics: Anti-infectives (From admission, onward)   Start     Dose/Rate Route Frequency Ordered Stop   12/19/19 0930  Darunavir-Cobicisctat-Emtricitabine-Tenofovir Alafenamide (SYMTUZA) 800-150-200-10 MG TABS 1 tablet        1 tablet Oral Daily with breakfast 12/19/19 0840     12/13/19 1000  bictegravir-emtricitabine-tenofovir AF (BIKTARVY) 50-200-25 MG per tablet 1 tablet  Status:  Discontinued        1 tablet Oral Daily 12/12/19 1413 12/19/19 0840   11/17/19 1548  ceFAZolin (ANCEF) 2-4 GM/100ML-% IVPB       Note to Pharmacy: Domenick Bookbinder   : cabinet override      11/17/19 1548 11/18/19 0359   11/16/19 1515  ceFAZolin (ANCEF) IVPB 2g/100 mL premix        2 g 200 mL/hr over 30 Minutes Intravenous To Radiology 11/16/19 1511 11/17/19 1625   11/15/19 0900  sulfamethoxazole-trimethoprim (BACTRIM DS) 800-160 MG per tablet 1 tablet        1 tablet Per Tube Once per day on Mon Wed Fri 11/13/19 1906     11/13/19 1615  dolutegravir (TIVICAY) tablet 50 mg  Status:  Discontinued        50 mg Oral Daily 11/13/19 1521 12/12/19 1413   11/13/19 1615  emtricitabine-tenofovir AF (DESCOVY) 200-25 MG per tablet 1 tablet  Status:  Discontinued        1 tablet Per Tube Daily 11/13/19 1521 12/12/19 1413   11/13/19 1530  sulfamethoxazole-trimethoprim (BACTRIM DS) 800-160 MG per tablet 1 tablet  Status:  Discontinued        1 tablet Oral Once per day on Mon Wed Fri 11/13/19 1441 11/13/19 1906   11/13/19 1500  bictegravir-emtricitabine-tenofovir AF (BIKTARVY) 50-200-25 MG per tablet 1 tablet  Status:  Discontinued        1 tablet Oral Daily 11/13/19 1403 11/13/19 1521   11/10/19 1000  erythromycin 250 mg in sodium chloride 0.9 % 100 mL IVPB        250 mg 100 mL/hr over 60 Minutes Intravenous Every 8 hours 11/10/19 0907 11/11/19 2000    11/07/19 1200  ceFEPIme (MAXIPIME) 2 g in sodium chloride  0.9 % 100 mL IVPB        2 g 200 mL/hr over 30 Minutes Intravenous Every 8 hours 11/07/19 1013 11/13/19 2127   11/03/19 1200  cefTRIAXone (ROCEPHIN) 2 g in sodium chloride 0.9 % 100 mL IVPB        2 g 200 mL/hr over 30 Minutes Intravenous Every 24 hours 11/03/19 0922 11/05/19 1427   11/02/19 0800  vancomycin (VANCOREADY) IVPB 750 mg/150 mL  Status:  Discontinued        750 mg 150 mL/hr over 60 Minutes Intravenous Every 12 hours 11/01/19 1935 11/02/19 1105   11/02/19 0600  piperacillin-tazobactam (ZOSYN) IVPB 3.375 g  Status:  Discontinued        3.375 g 12.5 mL/hr over 240 Minutes Intravenous Every 8 hours 11/02/19 0311 11/03/19 0920   11/01/19 2200  ceFEPIme (MAXIPIME) 2 g in sodium chloride 0.9 % 100 mL IVPB  Status:  Discontinued        2 g 200 mL/hr over 30 Minutes Intravenous Every 12 hours 11/01/19 2143 11/02/19 0311   11/01/19 1915  vancomycin (VANCOREADY) IVPB 1500 mg/300 mL        1,500 mg 150 mL/hr over 120 Minutes Intravenous  Once 11/01/19 1909 11/01/19 2147   11/01/19 1845  cefTRIAXone (ROCEPHIN) 1 g in sodium chloride 0.9 % 100 mL IVPB        1 g 200 mL/hr over 30 Minutes Intravenous  Once 11/01/19 1842 11/01/19 2051   11/01/19 1845  azithromycin (ZITHROMAX) 500 mg in sodium chloride 0.9 % 250 mL IVPB        500 mg 250 mL/hr over 60 Minutes Intravenous  Once 11/01/19 1842 11/01/19 2051       Time spent: 20 minutes    Erin Hearing ANP  [facility]  Pager [phone].  01/03/2020, 11:26 AM  LOS: 63 days

## 2020-01-04 LAB — GLUCOSE, CAPILLARY
Glucose-Capillary: 119 mg/dL — ABNORMAL HIGH (ref 70–99)
Glucose-Capillary: 128 mg/dL — ABNORMAL HIGH (ref 70–99)
Glucose-Capillary: 138 mg/dL — ABNORMAL HIGH (ref 70–99)
Glucose-Capillary: 112 mg/dL — ABNORMAL HIGH (ref 70–99)
Glucose-Capillary: 112 mg/dL — ABNORMAL HIGH (ref 70–99)
Glucose-Capillary: 133 mg/dL — ABNORMAL HIGH (ref 70–99)

## 2020-01-04 MED ORDER — QUETIAPINE FUMARATE 25 MG PO TABS
12.5000 mg | ORAL_TABLET | Freq: Every day | ORAL | Status: DC
  Administered 2020-01-04 – 2020-04-07 (×95): 12.5 mg
  Filled 2020-01-04 (×96): qty 1

## 2020-01-04 MED ORDER — BACLOFEN 10 MG PO TABS
20.0000 mg | ORAL_TABLET | Freq: Three times a day (TID) | ORAL | Status: DC
  Administered 2020-01-04 – 2020-01-05 (×4): 20 mg
  Filled 2020-01-04 (×4): qty 2

## 2020-01-04 NOTE — Plan of Care (Signed)

## 2020-01-04 NOTE — Progress Notes (Signed)
[facility] PROGRESS NOTE  Ryan Orozco:[id number] DOB: 07-20-1965 DOA: 11/01/2019 PCP: Default, Provider, MD    11/16   Status: Remains inpatient appropriate because:Altered mental status, Unsafe d/c plan and Inpatient level of care appropriate due to severity of illness. Patient newly diagnosed with anoxic brain injury  Dispo: The patient is from: Home              Anticipated d/c is to: SNF              Anticipated d/c date is: > 3 days              Patient currently is medically stable to d/c.  PMV training in process. Medicaid application in process. Patient will also need to have disability paperwork completed since do not expect full recovery.  Trach Tube has been in place greater than 30 days.   Code Status: Full Family Communication: 11/24 daughter at bedside; she is in agreement for patient being placed locally in SNF and is hopeful that over time he can be transferred to an SNF in the [place] area DVT prophylaxis: Subcutaneous heparin Vaccination status: Does not appear to have received Covid vaccine but will need to confirm with family  Foley catheter: No  HPI: 54 year old male with HIV, chronic alcohol abuse who was presented to the emergency department after being found unresponsive outside a convinient store.  EMS found him pulseless in PEA, unknown downtime, successfully resuscitated and brought to the emergency department.  UDS positive for THC/benzodiazepines.  CT head unremarkable.  Failed extubation trials due to severe anoxic brain injury and had tracheostomy placed.  PEG placed on 10/15.    Significant Hospital Events   9/29 Admit post PEA arrest. UDS positive for THC, benzo's. ETOH 177 10/05 EEG ongoing. Versed restarted overnight due to agitation. Nicardipine increased.  10/06 Developed tachypnea with WUA, no follow commands off sedation  10/07 Vomiting, TF held / restarted with recurrent vomiting 10/16 tracheostomy collar for 9  hours 10/18->10/19 off vent all night  10/20> TC 11/15 > downsized to #4 Shiley flex CFS  Subjective: Awake; needs to be repositioned in bed.  Seems uncomfortable secondary to ongoing issues with dystonia and hypertonicity.  Objective: Vitals:   01/04/20 0740 01/04/20 0938  BP:  122/85  Pulse: 97 95  Resp: 20 20  Temp:  98.6 F (37 C)  SpO2: 95% 96%    Intake/Output Summary (Last 24 hours) at 01/04/2020 1024 Last data filed at 01/03/2020 2034 Gross per 24 hour  Intake --  Output 450 ml  Net -450 ml   Filed Weights   01/02/20 0114 01/02/20 2153 01/03/20 0125  Weight: 73.2 kg 71.6 kg 71.6 kg    Exam: Constitutional: NAD, calm but appears uncomfortable Respiratory: Clear to auscultation bilaterally, #4 cuffless trach with 21% FiO2/TC; modest tracheal secretions  Cardiovascular: Regular rate and rhythm, skin warm and dry Abdomen: non tender, bowel sounds positive. PEG tube with tube feeding Musculoskeletal: Moderate upper extremity dystonic movements with activity.  Now noted with rhythmic head-bobbing.  Keeps legs flexed at hips right more so than left.  Extremities with generalized hypertonicity. Skin: Several decubiti as described below Neurologic: CN 2-12 grossly intact; Sensation intact both upper and lower extremities, difficult to test strength in upper extremities but appears to be 2/5 with lower extremities 2-3/5.   Psychiatric:  oriented times name.  Difficult to assess secondary to apparent expressive aphasia from brain injury   Assessment/Plan: Acute problems: Anoxic brain injury 2/2 PEA  arrest:  -Presumed 2/2 combination alcohol and BZD overdose with UDS positive for THC and BZDs -Do not expect he will recover fully from this event therefore long-term SNF placement recommended -Continue PT/OT/SLP -12/2 will increase baclofen to 20 mg TID for dystonic movements and increased muscle tone in LEs-now with rhythmic head-bobbing and uncertain if this related to  increased tonicity or side effect from recent addition of Seroquel-will go ahead and decrease Seroquel to 12.5 mg at bedtime -Continue scheduled Tylenol for any pain associated with dystonic movements -Continue Seroquel for sequelae from brain injury manifested by mild nocturnal agitation -Continue current placement of bed in room to allow for patient to see outside of door into hallway to minimize isolation  Acute hypoxemic respiratory failure/new tracheostomy tube requirement -Continue PMV training per SLP -#4 cuffless trach-capping trial at discretion of trach team -Tracheostomy tube placed on 10/3  -11/29 was evaluated by trach team and due to persistent encephalopathy and mild to modest secretions currently not a candidate for consideration for decannulation. 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   -Continue Keppra; levetiracetam level 35.9 on 11/12 -no further seizure activity since transitioned out of ICU setting  Dysphagia 2/2 anoxic brain injury -Continue tube feeding per PEG -Repeat SLP evaluation on 11/23 with recommendation for n.p.o. status  Goals of care:  -Palliative care was involved during this hospitalization.   -Goals of care were discussed. remains full code.  - Palliative care recommended outpatient follow-up with palliative providers.  Protein calorie malnutrition nutrition Status: Nutrition Problem: Increased nutrient needs Etiology: acute illness Signs/Symptoms: estimated needs Interventions: Tube feeding consisting of Osmolite 1.5 at 60 cc/h, Juven twice  daily, Prosource TF 45 mL 3 times daily Estimated body mass index is 25.47 kg/m as calculated from the following:   Height as of this encounter: _0  (1.676 m).   Weight as of this encounter: 71.6 kg.   Multiple decubiti not POA Pressure Injury 11/13/19 Anus Stage 3 -  Full thickness tissue loss. Subcutaneous fat may be visible but bone, tendon or muscle are NOT exposed. (Active)  Date First Assessed/Time First Assessed: 11/13/19 1000   Location: Anus  Staging: Stage 3 -  Full thickness tissue loss. Subcutaneous fat may be visible but bone, tendon or muscle are NOT exposed.  Present on Admission: No    Assessments 11/17/2019  8:00 AM 01/01/2020  9:15 AM  Dressing Type None --  Dressing -- Clean;Dry;New drainage  State of Healing -- Scar  Site / Wound Assessment Pink --  Peri-wound Assessment Intact Intact  Drainage Amount None --     No Linked orders to display     Wound / Incision (Open or Dehisced) 11/17/19 Puncture Abdomen Left;Anterior;Upper G-tube insertion site  (Active)  Date First Assessed/Time First Assessed: 11/17/19 1646   Wound Type: Puncture  Location: Abdomen  Location Orientation: Left;Anterior;Upper  Wound Description (Comments): G-tube insertion site   Present on Admission: No    Assessments 11/17/2019  4:47 PM 12/27/2019 10:00 AM  Dressing Type Gauze (Comment);Tape dressing --  Dressing Changed New --  Dressing Status Clean;Dry;Intact --  Dressing Change Frequency PRN --  Site / Wound Assessment Clean;Dry;Pink --  Peri-wound Assessment Intact --  Wound Length (cm) -- 0 cm  Wound Width (cm) -- 0 cm  Wound Depth (cm) -- 0 cm  Wound Volume (cm^3) -- 0 cm^3  Wound Surface Area (cm^2) -- 0 cm^2  Closure None --  Drainage Amount None --  Treatment Cleansed --     No Linked orders to display     Pressure Injury 12/01/19 Ankle Left;Lateral Stage 2 -  Partial thickness loss of dermis presenting as a shallow open injury with a red, pink wound bed without slough.  (Active)  Date First Assessed/Time First Assessed: 12/01/19 2002   Location: Ankle  Location Orientation: Left;Lateral  Staging: Stage 2 -  Partial thickness loss of dermis presenting as a shallow open injury with a red, pink wound bed without slough.    Assessments 12/01/2019  7:52 PM 01/01/2020  9:15 AM  Dressing Type Foam - Lift dressing to assess site every shift --  Dressing Clean;Dry;Intact Clean;Dry;Intact  Site / Wound Assessment Pink Clean;Pink  Peri-wound Assessment Intact --  Margins Unattached edges (unapproximated) --  Drainage Amount Scant --  Drainage Description Serosanguineous --  Treatment Cleansed --     No Linked orders to display      Other problems: Hypertension/grade 1 diastolic dysfunction:  -Blood pressure stable on amlodipine  -Echocardiogram this admission with preserved LVEF with evidence of grade 1 diastolic dysfunction. No physiology currently consistent with heart failure  HIV:  -CD4 count of 124.  -Dc'd Tivicay and Descovy infavor of Biktarvy to for eventual dc to SNF -Continue prophylactic Bactrim  Inguinal hernias -Evaluated by general surgery this admission. Documented as chronically incarcerated. Patient has been clinically stable and tolerating tube feedings and having bowel movements so unless patient obstructed would not be considered a candidate for repair. Even if obstructed consulting surgeon was not sure he would be a candidate for hernia repair regardless .   Data Reviewed: Basic Metabolic Panel: Recent Labs  Lab 12/29/19 0138  NA 133*  K 3.8  CL 97*  CO2 23  GLUCOSE 114*  BUN 23*  CREATININE 0.76  CALCIUM 9.7   Liver Function Tests: No results for input(s): AST, ALT, ALKPHOS, BILITOT, PROT, ALBUMIN in the last 168 hours. No results for input(s): LIPASE, AMYLASE in the last 168 hours. No results for input(s): AMMONIA in the last 168 hours. CBC: Recent Labs  Lab 12/29/19 0138  WBC 2.9*  HGB 14.2  HCT 43.1  MCV  94.7  PLT 137*   Cardiac Enzymes: No results for input(s): CKTOTAL, CKMB, CKMBINDEX, TROPONINI in the last 168 hours. BNP (last 3 results) No results for input(s): BNP in the last 8760 hours.  ProBNP (last 3 results) No results for input(s): PROBNP in the last 8760 hours.  CBG: Recent Labs  Lab 01/03/20 1547 01/03/20 1927 01/03/20 2312 01/04/20 0329 01/04/20 0734  GLUCAP 130* 132* 112* 119* 128*    No results found for this or any previous visit (from the past 240 hour(s)).   Studies: No results found.  Scheduled Meds: . acetaminophen (TYLENOL) oral liquid 160 mg/5 mL  650 mg Per Tube Q6H  . amLODipine  10 mg Per Tube Daily  . baclofen  20 mg Per Tube TID  . chlorhexidine gluconate (MEDLINE KIT)  15 mL Mouth Rinse BID  . Darunavir-Cobicisctat-Emtricitabine-Tenofovir Alafenamide  1 tablet Oral Q breakfast  . diclofenac Sodium  4 g Topical QID  . famotidine  10.4 mg Per Tube BID  .  feeding supplement (PROSource TF)  45 mL Per Tube TID  . folic acid  1 mg Per Tube Daily  . free water  150 mL Per Tube Q4H  . heparin injection (subcutaneous)  5,000 Units Subcutaneous Q8H  . levETIRAcetam  1,500 mg Per Tube BID  . nutrition supplement (JUVEN)  1 packet Per Tube BID BM  . nystatin   Topical TID  . QUEtiapine  12.5 mg Per Tube QHS  . scopolamine  1 patch Transdermal Q72H  . sulfamethoxazole-trimethoprim  1 tablet Per Tube Once per day on Mon Wed Fri  . thiamine  100 mg Per Tube Daily   Continuous Infusions: . feeding supplement (OSMOLITE 1.5 CAL) 1,000 mL (01/01/20 2148)    Active Problems:   Cardiac arrest [facility])   Community acquired pneumonia of left upper lobe of lung   Anoxic brain injury ([place])   Alcohol abuse   Acute respiratory failure with hypoxia ([facility])   Vomiting   Pressure injury of skin   Small bowel obstruction ([place])   Palliative care encounter   Bowel obstruction ([facility])   Chronic respiratory failure ([facility])   Diastolic dysfunction   Tracheostomy  dependent ([place])   Dysphagia   Protein-calorie malnutrition ([place])   Seizures ([place])   Bilateral recurrent inguinal hernia   Consultants:  PCCM  Palliative medicine  General surgery  Neurology  Interventional radiology  Procedures:  EEG  Echocardiogram  Tracheostomy  Antibiotics: Anti-infectives (From admission, onward)   Start     Dose/Rate Route Frequency Ordered Stop   12/19/19 0930  Darunavir-Cobicisctat-Emtricitabine-Tenofovir Alafenamide (SYMTUZA) 800-150-200-10 MG TABS 1 tablet        1 tablet Oral Daily with breakfast 12/19/19 0840     12/13/19 1000  bictegravir-emtricitabine-tenofovir AF (BIKTARVY) 50-200-25 MG per tablet 1 tablet  Status:  Discontinued        1 tablet Oral Daily 12/12/19 1413 12/19/19 0840   11/17/19 1548  ceFAZolin (ANCEF) 2-4 GM/100ML-% IVPB       Note to Pharmacy: Domenick Bookbinder   : cabinet override      11/17/19 1548 11/18/19 0359   11/16/19 1515  ceFAZolin (ANCEF) IVPB 2g/100 mL premix        2 g 200 mL/hr over 30 Minutes Intravenous To Radiology 11/16/19 1511 11/17/19 1625   11/15/19 0900  sulfamethoxazole-trimethoprim (BACTRIM DS) 800-160 MG per tablet 1 tablet        1 tablet Per Tube Once per day on Mon Wed Fri 11/13/19 1906     11/13/19 1615  dolutegravir (TIVICAY) tablet 50 mg  Status:  Discontinued        50 mg Oral Daily 11/13/19 1521 12/12/19 1413   11/13/19 1615  emtricitabine-tenofovir AF (DESCOVY) 200-25 MG per tablet 1 tablet  Status:  Discontinued        1 tablet Per Tube Daily 11/13/19 1521 12/12/19 1413   11/13/19 1530  sulfamethoxazole-trimethoprim (BACTRIM DS) 800-160 MG per tablet 1 tablet  Status:  Discontinued        1 tablet Oral Once per day on Mon Wed Fri 11/13/19 1441 11/13/19 1906   11/13/19 1500  bictegravir-emtricitabine-tenofovir AF (BIKTARVY) 50-200-25 MG per tablet 1 tablet  Status:  Discontinued        1 tablet Oral Daily 11/13/19 1403 11/13/19 1521   11/10/19 1000  erythromycin 250 mg in sodium chloride  0.9 % 100 mL IVPB        250 mg 100 mL/hr over 60 Minutes Intravenous Every 8 hours 11/10/19  4033 11/11/19 2000   11/07/19 1200  ceFEPIme (MAXIPIME) 2 g in sodium chloride 0.9 % 100 mL IVPB        2 g 200 mL/hr over 30 Minutes Intravenous Every 8 hours 11/07/19 1013 11/13/19 2127   11/03/19 1200  cefTRIAXone (ROCEPHIN) 2 g in sodium chloride 0.9 % 100 mL IVPB        2 g 200 mL/hr over 30 Minutes Intravenous Every 24 hours 11/03/19 0922 11/05/19 1427   11/02/19 0800  vancomycin (VANCOREADY) IVPB 750 mg/150 mL  Status:  Discontinued        750 mg 150 mL/hr over 60 Minutes Intravenous Every 12 hours 11/01/19 1935 11/02/19 1105   11/02/19 0600  piperacillin-tazobactam (ZOSYN) IVPB 3.375 g  Status:  Discontinued        3.375 g 12.5 mL/hr over 240 Minutes Intravenous Every 8 hours 11/02/19 0311 11/03/19 0920   11/01/19 2200  ceFEPIme (MAXIPIME) 2 g in sodium chloride 0.9 % 100 mL IVPB  Status:  Discontinued        2 g 200 mL/hr over 30 Minutes Intravenous Every 12 hours 11/01/19 2143 11/02/19 0311   11/01/19 1915  vancomycin (VANCOREADY) IVPB 1500 mg/300 mL        1,500 mg 150 mL/hr over 120 Minutes Intravenous  Once 11/01/19 1909 11/01/19 2147   11/01/19 1845  cefTRIAXone (ROCEPHIN) 1 g in sodium chloride 0.9 % 100 mL IVPB        1 g 200 mL/hr over 30 Minutes Intravenous  Once 11/01/19 1842 11/01/19 2051   11/01/19 1845  azithromycin (ZITHROMAX) 500 mg in sodium chloride 0.9 % 250 mL IVPB        500 mg 250 mL/hr over 60 Minutes Intravenous  Once 11/01/19 1842 11/01/19 2051       Time spent: 20 minutes    Erin Hearing ANP  [facility]  Pager [phone].  01/04/2020, 10:24 AM  LOS: 64 days

## 2020-01-04 NOTE — Progress Notes (Signed)
  Speech Language Pathology Treatment: Dysphagia;[name] Speaking valve;Cognitive-Linquistic  Patient Details Name: Ryan Orozco MRN: [id number] DOB: 11-Oct-1965 Today's Date: 01/04/2020 Time: 7628-3151 SLP Time Calculation (min) (ACUTE ONLY): 12 min  Assessment / Plan / Recommendation Clinical Impression  Ryan Orozco is beginning to plateau with his cognitive-linguistic and PMV goals. Today he imitated labial placement for phoneme /o/. He is not able to respond to yes/no questions but has shown articulatory approximation to "yes", "no" several times. He will follow simple commands with repetition to 20% accuracy occasionally.  After oral care, bites applesauce and tsp trials water consumed with suspected incoordinated respiratory pattern but no outward cough. He is not appropriate for instrumental assessment of swallow resulting from cognitive impairments, decreased attention and likely poor oral intake at present time.  ST will work with Ryan Orozco 1 or 2 more times but will need to discharge if there has been no functional improvements.    HPI HPI: 53 y/o male with hx of HIV, ETOH abuse found down outside a convenience store on 9/27, received out of hospital CPR. Presents with acute hypoxic respiratory failure s/p cardiac arrest, acute encephalopathy due to hypoxic brain injury. Intubated 9/27-10/13. Received a tracheostomy 10/13, treachestomy collar trial for 9 hours on 10/16. CXR on 10/17 improving and shows left lung opacities have essentially resolved      SLP Plan  Continue with current plan of care       Recommendations  Diet recommendations: NPO      Patient may use Passy-Muir Speech Valve: During all therapies with supervision PMSV Supervision: Full MD: Please consider changing trach tube to : Smaller size         Oral Care Recommendations: Oral care QID Follow up Recommendations: LTACH;Skilled Nursing facility SLP Visit Diagnosis: Cognitive communication deficit  (R41.841);[place]ph[place] (R49.1) Plan: Continue with current plan of care                       Royce Macadamia 01/04/2020, 3:05 PM [name].Ed Nurse, children's [phone] Office [phone]

## 2020-01-04 NOTE — Plan of Care (Signed)
  Problem: Education: Goal: Knowledge of General Education information will improve Description Including pain rating scale, medication(s)/side effects and non-pharmacologic comfort measures Outcome: Progressing   

## 2020-01-05 LAB — CBC WITH DIFFERENTIAL/PLATELET
Abs Immature Granulocytes: 0.02 10*3/uL (ref 0.00–0.07)
Basophils Absolute: 0 10*3/uL (ref 0.0–0.1)
Basophils Relative: 1 %
Eosinophils Absolute: 0.1 10*3/uL (ref 0.0–0.5)
Eosinophils Relative: 2 %
HCT: 48.7 % (ref 39.0–52.0)
Hemoglobin: 15.7 g/dL (ref 13.0–17.0)
Immature Granulocytes: 1 %
Lymphocytes Relative: 26 %
Lymphs Abs: 0.8 10*3/uL (ref 0.7–4.0)
MCH: 30.7 pg (ref 26.0–34.0)
MCHC: 32.2 g/dL (ref 30.0–36.0)
MCV: 95.3 fL (ref 80.0–100.0)
Monocytes Absolute: 0.5 10*3/uL (ref 0.1–1.0)
Monocytes Relative: 16 %
Neutro Abs: 1.7 10*3/uL (ref 1.7–7.7)
Neutrophils Relative %: 54 %
Platelets: 164 10*3/uL (ref 150–400)
RBC: 5.11 MIL/uL (ref 4.22–5.81)
RDW: 13.2 % (ref 11.5–15.5)
WBC: 3 10*3/uL — ABNORMAL LOW (ref 4.0–10.5)
nRBC: 0 % (ref 0.0–0.2)

## 2020-01-05 LAB — BASIC METABOLIC PANEL
Anion gap: 12 (ref 5–15)
BUN: 19 mg/dL (ref 6–20)
CO2: 24 mmol/L (ref 22–32)
Calcium: 10.2 mg/dL (ref 8.9–10.3)
Chloride: 104 mmol/L (ref 98–111)
Creatinine, Ser: 0.66 mg/dL (ref 0.61–1.24)
GFR, Estimated: >60 mL/min (ref >60)
Glucose, Bld: 122 mg/dL — ABNORMAL HIGH (ref 70–99)
Potassium: 4.4 mmol/L (ref 3.5–5.1)
Sodium: 140 mmol/L (ref 135–145)

## 2020-01-05 LAB — GLUCOSE, CAPILLARY
Glucose-Capillary: 101 mg/dL — ABNORMAL HIGH (ref 70–99)
Glucose-Capillary: 114 mg/dL — ABNORMAL HIGH (ref 70–99)
Glucose-Capillary: 117 mg/dL — ABNORMAL HIGH (ref 70–99)
Glucose-Capillary: 122 mg/dL — ABNORMAL HIGH (ref 70–99)
Glucose-Capillary: 122 mg/dL — ABNORMAL HIGH (ref 70–99)
Glucose-Capillary: 132 mg/dL — ABNORMAL HIGH (ref 70–99)

## 2020-01-05 MED ORDER — METAXALONE 800 MG PO TABS
400.0000 mg | ORAL_TABLET | Freq: Three times a day (TID) | ORAL | Status: DC
  Administered 2020-01-05 – 2020-01-07 (×8): 400 mg
  Filled 2020-01-05 (×2): qty 1
  Filled 2020-01-05 (×7): qty 0.5
  Filled 2020-01-05: qty 1
  Filled 2020-01-05 (×2): qty 0.5
  Filled 2020-01-05: qty 1

## 2020-01-05 MED ORDER — METHOCARBAMOL 500 MG PO TABS: 750.0000 mg | ORAL_TABLET | Freq: Three times a day (TID) | ORAL | Status: DC

## 2020-01-05 NOTE — Progress Notes (Addendum)
[facility] PROGRESS NOTE  [place] CHE:[id number] DOB: [birth year] DOA: 11/01/2019 PCP: Default, Provider, MD    11/16   Status: Remains inpatient appropriate because:Altered mental status, Unsafe d/c plan and Inpatient level of care appropriate due to severity of illness. Patient newly diagnosed with anoxic brain injury  Dispo: The patient is from: Home              Anticipated d/c is to: SNF              Anticipated d/c date is: > 3 days              Patient currently is medically stable to d/c.  PMV training in process. Medicaid application in process. Patient will also need to have disability paperwork completed since do not expect full recovery.  Trach Tube has been in place greater than 30 days.   Code Status: Full Family Communication: 11/24 daughter at bedside; she is in agreement for patient being placed locally in SNF and is hopeful that over time he can be transferred to an SNF in the [place] area DVT prophylaxis: Subcutaneous heparin Vaccination status: Does not appear to have received Covid vaccine but will need to confirm with family  Foley catheter: No  HPI: 54 year old male with HIV, chronic alcohol abuse who was presented to the emergency department after being found unresponsive outside a convinient store.  EMS found him pulseless in PEA, unknown downtime, successfully resuscitated and brought to the emergency department.  UDS positive for THC/benzodiazepines.  CT head unremarkable.  Failed extubation trials due to severe anoxic brain injury and had tracheostomy placed.  PEG placed on 10/15.    Significant Hospital Events   9/29 Admit post PEA arrest. UDS positive for THC, benzo's. ETOH 177 10/05 EEG ongoing. Versed restarted overnight due to agitation. Nicardipine increased.  10/06 Developed tachypnea with WUA, no follow commands off sedation  10/07 Vomiting, TF held / restarted with recurrent vomiting 10/16 tracheostomy collar for 9  hours 10/18->10/19 off vent all night  10/20> TC 11/15 > downsized to #4 Shiley flex CFS  Subjective: Awakened-drowsy today noting baclofen dosage has been increased.  At rest does not have any rhythmic head movements this only occurs when patient is awake and attempting to hit up in bed.  Objective: Vitals:   01/05/20 0305 01/05/20 0340  BP: 118/76   Pulse: 90 83  Resp: 16 17  Temp: 97.9 F (36.6 C)   SpO2: 96% 95%    Intake/Output Summary (Last 24 hours) at 01/05/2020 1044 Last data filed at 01/05/2020 0300 Gross per 24 hour  Intake --  Output 1500 ml  Net -1500 ml   Filed Weights   01/02/20 2153 01/03/20 0125 01/05/20 0436  Weight: 71.6 kg 71.6 kg 72.1 kg    Exam: Constitutional: NAD, calm  Respiratory: Clear to auscultation bilaterally, #4 cuffless trach with 21% FiO2/TC; modest tracheal secretions  Cardiovascular: Regular rate and rhythm, skin warm and dry Abdomen: non tender, bowel sounds positive. PEG tube with tube feeding Musculoskeletal: Moderate upper extremity dystonic movements with activity.  Now noted with rhythmic head-bobbing only when awake and active not present while sleeping.  Keeps legs flexed at hips right more so than left.  Extremities with generalized hypertonicity. Skin: Several decubiti as described below Neurologic: CN 2-12 grossly intact; Sensation intact both upper and lower extremities, difficult to test strength in upper extremities but appears to be 2/5 with lower extremities 2-3/5.   Psychiatric:  oriented times name.  Difficult to assess secondary to expressive aphasia from brain injury   Assessment/Plan: Acute problems: Anoxic brain injury 2/2 PEA arrest:  -Presumed 2/2 combination alcohol and BZD overdose with UDS positive for THC and BZDs -Do not expect he will recover fully from this event therefore long-term SNF placement recommended -Continue PT/OT/SLP -12/2 baclofen increased to 20 mg TID PT informed this writer that patient  actually having increased tonicity.  Will DC baclofen in favor of combination of Skelaxin 400 mg TID  -12/2 Seroquel decreased to 12.5 mg in context of rhythmic head-bobbing i.e. concerns over possible EPS symptoms.  Head-bobbing not present while sleeping -Continue scheduled Tylenol for any pain associated with dystonic movements -Continue Seroquel for sequelae from brain injury manifested by mild nocturnal agitation -Continue current placement of bed in room to allow for patient to see outside of door into hallway to minimize isolation  Acute hypoxemic respiratory failure/new tracheostomy tube requirement -Continue PMV training per SLP -#4 cuffless trach-capping trial at discretion of trach team -Tracheostomy tube placed on 10/3  -11/29 was evaluated by trach team; because of persistent encephalopathy and mild to modest secretions currently not a candidate for decannulation at this time.  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   -Continue Keppra; levetiracetam level 35.9 on 11/12 -no further seizure activity since transitioned out of ICU setting  Dysphagia 2/2 anoxic brain injury -Continue tube feeding per PEG -Repeat SLP evaluation on 11/23 with recommendation for n.p.o. status  Goals of care:  -Palliative care was involved during this hospitalization.   -Goals of care were discussed. remains full code.  - Palliative care recommended outpatient follow-up with palliative providers.  Protein calorie malnutrition nutrition Status: Nutrition Problem: Increased nutrient needs Etiology:  acute illness Signs/Symptoms: estimated needs Interventions: Tube feeding consisting of Osmolite 1.5 at 60 cc/h, Juven twice daily, Prosource TF 45 mL 3 times daily Estimated body mass index is 25.66 kg/m as calculated from the following:   Height as of this encounter: 5' 6"  (1.676 m).   Weight as of this encounter: 72.1 kg.   Multiple decubiti not POA Wound / Incision (Open or Dehisced) 11/17/19 Puncture Abdomen Left;Anterior;Upper G-tube insertion site  (Active)  Date First Assessed/Time First Assessed: 11/17/19 1646   Wound Type: Puncture  Location: Abdomen  Location Orientation: Left;Anterior;Upper  Wound Description (Comments): G-tube insertion site   Present on Admission: No    Assessments 11/17/2019  4:47 PM 01/04/2020  7:49 PM  Dressing Type Gauze (Comment);Tape dressing --  Dressing Changed New --  Dressing Status Clean;Dry;Intact Clean;Dry;Intact  Dressing Change Frequency PRN PRN  Site / Wound Assessment Clean;Dry;Pink Clean;Dry  Peri-wound Assessment Intact --  Margins -- Unattached edges (unapproximated)  Closure None None  Drainage Amount None Minimal  Treatment Cleansed --     No Linked orders to display      Other problems: Hypertension/grade 1 diastolic dysfunction:  -Blood pressure stable on amlodipine  -Echocardiogram this admission with preserved LVEF with evidence of grade 1 diastolic dysfunction. No physiology currently consistent with heart failure  HIV:  -CD4 count of 124.  -Dc'd Tivicay and Descovy infavor of Biktarvy to  for eventual dc to SNF -Continue prophylactic Bactrim  Inguinal hernias -Evaluated by general surgery this admission. Documented as chronically incarcerated. Patient has been clinically stable and tolerating tube feedings and having bowel movements so unless patient obstructed would not be considered a candidate for repair. Even if obstructed consulting surgeon was not sure he would be a candidate for hernia repair  regardless .   Data Reviewed: Basic Metabolic Panel: Recent Labs  Lab 01/05/20 0757  NA 140  K 4.4  CL 104  CO2 24  GLUCOSE 122*  BUN 19  CREATININE 0.66  CALCIUM 10.2   Liver Function Tests: No results for input(s): AST, ALT, ALKPHOS, BILITOT, PROT, ALBUMIN in the last 168 hours. No results for input(s): LIPASE, AMYLASE in the last 168 hours. No results for input(s): AMMONIA in the last 168 hours. CBC: Recent Labs  Lab 01/05/20 0757  WBC 3.0*  NEUTROABS 1.7  HGB 15.7  HCT 48.7  MCV 95.3  PLT 164   Cardiac Enzymes: No results for input(s): CKTOTAL, CKMB, CKMBINDEX, TROPONINI in the last 168 hours. BNP (last 3 results) No results for input(s): BNP in the last 8760 hours.  ProBNP (last 3 results) No results for input(s): PROBNP in the last 8760 hours.  CBG: Recent Labs  Lab 01/04/20 1605 01/04/20 2000 01/04/20 2301 01/05/20 0303 01/05/20 0742  GLUCAP 112* 112* 133* 122* 122*    No results found for this or any previous visit (from the past 240 hour(s)).   Studies: No results found.  Scheduled Meds: . acetaminophen (TYLENOL) oral liquid 160 mg/5 mL  650 mg Per Tube Q6H  . amLODipine  10 mg Per Tube Daily  . baclofen  20 mg Per Tube TID  . chlorhexidine gluconate (MEDLINE KIT)  15 mL Mouth Rinse BID  . Darunavir-Cobicisctat-Emtricitabine-Tenofovir Alafenamide  1 tablet Oral Q breakfast  . diclofenac Sodium  4 g Topical QID  . famotidine  10.4 mg Per Tube BID  . feeding supplement (PROSource TF)  45 mL Per Tube TID  . folic acid  1 mg Per Tube Daily  . free water  150 mL Per Tube Q4H  . heparin injection (subcutaneous)  5,000 Units Subcutaneous Q8H  . levETIRAcetam  1,500 mg Per Tube BID  . nutrition supplement (JUVEN)  1 packet Per Tube BID BM  . nystatin   Topical TID  . QUEtiapine  12.5 mg Per Tube QHS  . scopolamine  1 patch Transdermal Q72H  . sulfamethoxazole-trimethoprim  1 tablet Per Tube Once per day on Mon Wed Fri  . thiamine  100 mg  Per Tube Daily   Continuous Infusions: . feeding supplement (OSMOLITE 1.5 CAL) 1,000 mL (01/05/20 0704)    Active Problems:   Cardiac arrest [facility])   Community acquired pneumonia of left upper lobe of lung   Anoxic brain injury ([place])   Alcohol abuse   Acute respiratory failure with hypoxia ([facility])   Vomiting   Pressure injury of skin   Small bowel obstruction ([facility])   Palliative care encounter   Bowel obstruction ([facility])   Chronic respiratory failure ([facility])   Diastolic dysfunction   Tracheostomy dependent ([place])   Dysphagia   Protein-calorie malnutrition ([place])   Seizures ([place])   Bilateral recurrent inguinal hernia   Consultants:  PCCM  Palliative medicine  General surgery  Neurology  Interventional radiology  Procedures:  EEG  Echocardiogram  Tracheostomy  Antibiotics: Anti-infectives (From admission, onward)   Start     Dose/Rate Route Frequency Ordered Stop  12/19/19 0930  Darunavir-Cobicisctat-Emtricitabine-Tenofovir Alafenamide (SYMTUZA) 800-150-200-10 MG TABS 1 tablet        1 tablet Oral Daily with breakfast 12/19/19 0840     12/13/19 1000  bictegravir-emtricitabine-tenofovir AF (BIKTARVY) 50-200-25 MG per tablet 1 tablet  Status:  Discontinued        1 tablet Oral Daily 12/12/19 1413 12/19/19 0840   11/17/19 1548  ceFAZolin (ANCEF) 2-4 GM/100ML-% IVPB       Note to Pharmacy: Domenick Bookbinder   : cabinet override      11/17/19 1548 11/18/19 0359   11/16/19 1515  ceFAZolin (ANCEF) IVPB 2g/100 mL premix        2 g 200 mL/hr over 30 Minutes Intravenous To Radiology 11/16/19 1511 11/17/19 1625   11/15/19 0900  sulfamethoxazole-trimethoprim (BACTRIM DS) 800-160 MG per tablet 1 tablet        1 tablet Per Tube Once per day on Mon Wed Fri 11/13/19 1906     11/13/19 1615  dolutegravir (TIVICAY) tablet 50 mg  Status:  Discontinued        50 mg Oral Daily 11/13/19 1521 12/12/19 1413   11/13/19 1615  emtricitabine-tenofovir AF (DESCOVY) 200-25 MG per tablet 1 tablet   Status:  Discontinued        1 tablet Per Tube Daily 11/13/19 1521 12/12/19 1413   11/13/19 1530  sulfamethoxazole-trimethoprim (BACTRIM DS) 800-160 MG per tablet 1 tablet  Status:  Discontinued        1 tablet Oral Once per day on Mon Wed Fri 11/13/19 1441 11/13/19 1906   11/13/19 1500  bictegravir-emtricitabine-tenofovir AF (BIKTARVY) 50-200-25 MG per tablet 1 tablet  Status:  Discontinued        1 tablet Oral Daily 11/13/19 1403 11/13/19 1521   11/10/19 1000  erythromycin 250 mg in sodium chloride 0.9 % 100 mL IVPB        250 mg 100 mL/hr over 60 Minutes Intravenous Every 8 hours 11/10/19 0907 11/11/19 2000   11/07/19 1200  ceFEPIme (MAXIPIME) 2 g in sodium chloride 0.9 % 100 mL IVPB        2 g 200 mL/hr over 30 Minutes Intravenous Every 8 hours 11/07/19 1013 11/13/19 2127   11/03/19 1200  cefTRIAXone (ROCEPHIN) 2 g in sodium chloride 0.9 % 100 mL IVPB        2 g 200 mL/hr over 30 Minutes Intravenous Every 24 hours 11/03/19 0922 11/05/19 1427   11/02/19 0800  vancomycin (VANCOREADY) IVPB 750 mg/150 mL  Status:  Discontinued        750 mg 150 mL/hr over 60 Minutes Intravenous Every 12 hours 11/01/19 1935 11/02/19 1105   11/02/19 0600  piperacillin-tazobactam (ZOSYN) IVPB 3.375 g  Status:  Discontinued        3.375 g 12.5 mL/hr over 240 Minutes Intravenous Every 8 hours 11/02/19 0311 11/03/19 0920   11/01/19 2200  ceFEPIme (MAXIPIME) 2 g in sodium chloride 0.9 % 100 mL IVPB  Status:  Discontinued        2 g 200 mL/hr over 30 Minutes Intravenous Every 12 hours 11/01/19 2143 11/02/19 0311   11/01/19 1915  vancomycin (VANCOREADY) IVPB 1500 mg/300 mL        1,500 mg 150 mL/hr over 120 Minutes Intravenous  Once 11/01/19 1909 11/01/19 2147   11/01/19 1845  cefTRIAXone (ROCEPHIN) 1 g in sodium chloride 0.9 % 100 mL IVPB        1 g 200 mL/hr over 30 Minutes Intravenous  Once 11/01/19 1842  11/01/19 2051   11/01/19 1845  azithromycin (ZITHROMAX) 500 mg in sodium chloride 0.9 % 250 mL IVPB         500 mg 250 mL/hr over 60 Minutes Intravenous  Once 11/01/19 1842 11/01/19 2051       Time spent: 20 minutes    Erin Hearing ANP  [facility]  Pager [phone].  01/05/2020, 10:44 AM  LOS: 65 days

## 2020-01-05 NOTE — Progress Notes (Signed)
Physical Therapy Treatment Patient Details Name: Ryan Orozco MRN: [id number] DOB: [birth year] Today's Date: 01/05/2020    History of Present Illness 54 year old male found down outside convenience store 9/27. Pt with PEA and hypoxic brain injury. Trach 10/13, PEG 10/15. PMHx: HIV AIDS and alcohol abuse    PT Comments    Pt with increased tone on R side compared to previous session. Pt unable to tolerate/resistant to therapist providing sustained pressure to extend RLE. Pt resistant to sitting EOB with increased lean R throwing himself toward pillow. Pt continues to demonstrate deficits in balance, ROM, strength, coordination, safety and bed mobility and will benefit from skilled PT to address deficits ot maximize independence with functional mobility prior to discharge.    Follow Up Recommendations  SNF;Supervision/Assistance - 24 hour     Equipment Recommendations  Wheelchair (measurements PT);Wheelchair cushion (measurements PT);Hospital bed;Other (comment)    Recommendations for Other Services       Precautions / Restrictions      Mobility  Bed Mobility Overal bed mobility: Needs Assistance Bed Mobility: Rolling Rolling: Total assist   Supine to sit: Total assist Sit to supine: Total assist      Transfers                    Ambulation/Gait                 Stairs             Wheelchair Mobility    Modified Rankin (Stroke Patients Only)       Balance       Sitting balance - Comments: attempted to sit EOB with pt demonstrating increased R flexor tone and forcefully leaning R to return to supine in bed. Therapist providing mod-max A to maintain sitting balance approximately 3 minutes Postural control: Right lateral lean                                  Cognition                                              Exercises      General Comments General comments (skin integrity, edema, etc.): pt  able to extend LLE with overpressure from therapist and maintained for increased time, therapist unable to extend RLE pt very guarded and moaning wtih any attempt to provide overpressure to extension R LE      Pertinent Vitals/Pain Pain Assessment: Faces Faces Pain Scale: Hurts little more Pain Location: with R>L knee extension Pain Descriptors / Indicators: Moaning;Guarding;Grimacing    Home Living                      Prior Function            PT Goals (current goals can now be found in the care plan section) Acute Rehab PT Goals Patient Stated Goal: none stated PT Goal Formulation: With patient Time For Goal Achievement: 01/18/20 Potential to Achieve Goals: Fair Progress towards PT goals: Not progressing toward goals - comment (increased tone and resistance to movement)    Frequency    Min 2X/week      PT Plan Current plan remains appropriate    Co-evaluation  AM-PAC PT "6 Clicks" Mobility   Outcome Measure  Help needed turning from your back to your side while in a flat bed without using bedrails?: Total Help needed moving from lying on your back to sitting on the side of a flat bed without using bedrails?: Total Help needed moving to and from a bed to a chair (including a wheelchair)?: Total Help needed standing up from a chair using your arms (e.g., wheelchair or bedside chair)?: Total Help needed to walk in hospital room?: Total Help needed climbing 3-5 steps with a railing? : Total 6 Click Score: 6    End of Session Equipment Utilized During Treatment: Oxygen Activity Tolerance: Patient limited by pain Patient left: in bed;with call bell/phone within reach;with bed alarm set Nurse Communication: Mobility status PT Visit Diagnosis: Other abnormalities of gait and mobility (R26.89);Muscle weakness (generalized) (M62.81)     Time: 7588-3254 PT Time Calculation (min) (ACUTE ONLY): 13 min  Charges:  $Therapeutic Activity: 8-22  mins                     Ginette Otto, DPT Acute Rehabilitation Services [phone]   Lucretia Field 01/05/2020, 11:53 AM

## 2020-01-06 LAB — GLUCOSE, CAPILLARY
Glucose-Capillary: 125 mg/dL — ABNORMAL HIGH (ref 70–99)
Glucose-Capillary: 113 mg/dL — ABNORMAL HIGH (ref 70–99)
Glucose-Capillary: 102 mg/dL — ABNORMAL HIGH (ref 70–99)
Glucose-Capillary: 101 mg/dL — ABNORMAL HIGH (ref 70–99)

## 2020-01-06 NOTE — Progress Notes (Signed)
[facility] PROGRESS NOTE  Athanasius [place] KKX:[id number] DOB: 1965-04-12 DOA: 11/01/2019 PCP: Default, Provider, MD    Status: Remains inpatient appropriate because:Altered mental status, Unsafe d/c plan and Inpatient level of care appropriate due to severity of illness. Patient newly diagnosed with anoxic brain injury  Dispo: The patient is from: Home              Anticipated d/c is to: SNF              Anticipated d/c date is: > 3 days              Patient currently is medically stable to d/c.  PMV training in process. Medicaid application in process. Patient will also need to have disability paperwork completed since do not expect full recovery.  Trach Tube has been in place greater than 30 days.   Code Status: Full Family Communication:  Communicated on 11/24  with daughter at bedside; she is in agreement for patient being placed locally in SNF and is hopeful that over time he can be transferred to an SNF in the [place] area DVT prophylaxis: Subcutaneous heparin Vaccination status: Does not appear to have received Covid vaccine but will need to confirm with family  Foley catheter: No  HPI: 54 year old male with HIV, chronic alcohol abuse who was presented to the emergency department after being found unresponsive outside a convenient store.  EMS found him pulseless in PEA, unknown downtime, successfully resuscitated and brought to the emergency department.  UDS positive for THC/benzodiazepines.  CT head unremarkable.  Failed extubation trials due to severe anoxic brain injury and had tracheostomy placed.  PEG placed on 10/15.    Significant Hospital Events   9/29 Admit post PEA arrest. UDS positive for THC, benzo's. ETOH 177 10/05 EEG ongoing. Versed restarted overnight due to agitation. Nicardipine increased.  10/06 Developed tachypnea with WUA, no follow commands off sedation  10/07 Vomiting, TF held / restarted with recurrent vomiting 10/16 tracheostomy collar for 9  hours 10/18->10/19 off vent all night  10/20> TC 11/15 > downsized to #4 Shiley flex CFS  Subjective: Patient was seen and examined in bed side.  Overnight events noted.  There is no significant change in patient's condition from yesterday.  Patient is alert and arousable.  Objective: Vitals:   01/06/20 0317 01/06/20 0841  BP:  108/67  Pulse: 87 81  Resp: 16 (!) 22  Temp:    SpO2: 95% 98%    Intake/Output Summary (Last 24 hours) at 01/06/2020 1108 Last data filed at 01/06/2020 0340 Gross per 24 hour  Intake --  Output 2650 ml  Net -2650 ml   Filed Weights   01/03/20 0125 01/05/20 0436 01/06/20 0411  Weight: 71.6 kg 72.1 kg 72.4 kg    Exam: Constitutional: Appears calm and comfortable,  not in any distress. Respiratory: Clear to auscultation bilaterally, #4 cuffless trach with 21% FiO2/TC; modest tracheal secretions . Cardiovascular: Regular rate and rhythm, skin warm and dry Abdomen: non tender, bowel sounds positive. PEG tube with tube feeding. Musculoskeletal: Moderate upper extremity dystonic movements with activity.  Now noted with rhythmic head-bobbing only when awake and active not present while sleeping.  Keeps legs flexed at hips right more so than left.  Extremities with generalized hypertonicity. Skin: Several decubiti as described below Neurologic:  Unable to assess.  Psychiatric:  oriented times name.  Difficult to assess secondary to expressive aphasia from brain injury   Assessment/Plan: Acute problems: Anoxic brain injury 2/2 PEA arrest:  -  Presumed 2/2 combination alcohol and BZD overdose with UDS positive for THC and BZDs -Do not expect he will recover fully from this event therefore long-term SNF placement recommended -Continue PT/OT/SLP -12/2 baclofen increased to 20 mg TID PT informed this writer that patient actually having increased tonicity.  Will DC baclofen in favor of combination of Skelaxin 400 mg TID  -12/2 Seroquel decreased to 12.5 mg in  context of rhythmic head-bobbing i.e. concerns over possible EPS symptoms.  Head-bobbing not present while sleeping -Continue scheduled Tylenol for any pain associated with dystonic movements -Continue Seroquel for sequelae from brain injury manifested by mild nocturnal agitation -Continue current placement of bed in room to allow for patient to see outside of door into hallway to minimize isolation  Acute hypoxemic respiratory failure/new tracheostomy tube requirement -Continue PMV training per SLP -#4 cuffless trach-capping trial at discretion of trach team -Tracheostomy tube placed on 10/3  -11/29 was evaluated by trach team; because of persistent encephalopathy and mild to modest secretions currently not a candidate for decannulation at this time.  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   -Continue Keppra; levetiracetam level 35.9 on 11/12 -no further seizure activity since transitioned out of ICU setting  Dysphagia 2/2 anoxic brain injury -Continue tube feeding per PEG -Repeat SLP evaluation on 11/23 with recommendation for n.p.o. status  Goals of care:  -Palliative care was involved during this hospitalization.   -Goals of care were discussed. remains full code.  - Palliative care recommended outpatient follow-up with palliative providers.  Protein calorie malnutrition nutrition Status: Nutrition Problem: Increased nutrient needs Etiology: acute illness Signs/Symptoms: estimated needs Interventions: Tube feeding consisting of Osmolite 1.5 at 60 cc/h, Juven twice daily,  Prosource TF 45 mL 3 times daily Estimated body mass index is 25.78 kg/m as calculated from the following:   Height as of this encounter: _0  (1.676 m).   Weight as of this encounter: 72.4 kg.   Multiple decubiti not POA Wound / Incision (Open or Dehisced) 11/17/19 Puncture Abdomen Left;Anterior;Upper G-tube insertion site  (Active)  Date First Assessed/Time First Assessed: 11/17/19 1646   Wound Type: Puncture  Location: Abdomen  Location Orientation: Left;Anterior;Upper  Wound Description (Comments): G-tube insertion site   Present on Admission: No    Assessments 11/17/2019  4:47 PM 01/04/2020  7:49 PM  Dressing Type Gauze (Comment);Tape dressing --  Dressing Changed New --  Dressing Status Clean;Dry;Intact Clean;Dry;Intact  Dressing Change Frequency PRN PRN  Site / Wound Assessment Clean;Dry;Pink Clean;Dry  Peri-wound Assessment Intact --  Margins -- Unattached edges (unapproximated)  Closure None None  Drainage Amount None Minimal  Treatment Cleansed --     No Linked orders to display      Other problems: Hypertension/grade 1 diastolic dysfunction:  -Blood pressure stable on amlodipine  -Echocardiogram this admission with preserved LVEF with evidence of grade 1 diastolic dysfunction. No physiology currently consistent with heart failure  HIV:  -CD4 count of 124.  -Dc'd Tivicay and Descovy infavor of Biktarvy to for eventual dc to SNF -Continue prophylactic Bactrim  Inguinal hernias -Evaluated by general surgery this admission. Documented as chronically incarcerated. Patient  has been clinically stable and tolerating tube feedings and having bowel movements so unless patient obstructed would not be considered a candidate for repair. Even if obstructed consulting surgeon was not sure he would be a candidate for hernia repair regardless .   Data Reviewed: Basic Metabolic Panel: Recent Labs  Lab 01/05/20 0757  NA 140  K 4.4  CL 104  CO2 24  GLUCOSE 122*  BUN 19   CREATININE 0.66  CALCIUM 10.2   Liver Function Tests: No results for input(s): AST, ALT, ALKPHOS, BILITOT, PROT, ALBUMIN in the last 168 hours. No results for input(s): LIPASE, AMYLASE in the last 168 hours. No results for input(s): AMMONIA in the last 168 hours. CBC: Recent Labs  Lab 01/05/20 0757  WBC 3.0*  NEUTROABS 1.7  HGB 15.7  HCT 48.7  MCV 95.3  PLT 164   Cardiac Enzymes: No results for input(s): CKTOTAL, CKMB, CKMBINDEX, TROPONINI in the last 168 hours. BNP (last 3 results) No results for input(s): BNP in the last 8760 hours.  ProBNP (last 3 results) No results for input(s): PROBNP in the last 8760 hours.  CBG: Recent Labs  Lab 01/05/20 1225 01/05/20 1603 01/05/20 2032 01/05/20 2352 01/06/20 0405  GLUCAP 101* 132* 117* 114* 125*    No results found for this or any previous visit (from the past 240 hour(s)).   Studies: No results found.  Scheduled Meds: . acetaminophen (TYLENOL) oral liquid 160 mg/5 mL  650 mg Per Tube Q6H  . amLODipine  10 mg Per Tube Daily  . chlorhexidine gluconate (MEDLINE KIT)  15 mL Mouth Rinse BID  . Darunavir-Cobicisctat-Emtricitabine-Tenofovir Alafenamide  1 tablet Oral Q breakfast  . diclofenac Sodium  4 g Topical QID  . famotidine  10.4 mg Per Tube BID  . feeding supplement (PROSource TF)  45 mL Per Tube TID  . folic acid  1 mg Per Tube Daily  . free water  150 mL Per Tube Q4H  . heparin injection (subcutaneous)  5,000 Units Subcutaneous Q8H  . levETIRAcetam  1,500 mg Per Tube BID  . metaxalone  400 mg Per Tube TID  . nutrition supplement (JUVEN)  1 packet Per Tube BID BM  . nystatin   Topical TID  . QUEtiapine  12.5 mg Per Tube QHS  . scopolamine  1 patch Transdermal Q72H  . sulfamethoxazole-trimethoprim  1 tablet Per Tube Once per day on Mon Wed Fri  . thiamine  100 mg Per Tube Daily   Continuous Infusions: . feeding supplement (OSMOLITE 1.5 CAL) 1,000 mL (01/06/20 0411)    Active Problems:   Cardiac arrest  ([place])   Community acquired pneumonia of left upper lobe of lung   Anoxic brain injury ([place])   Alcohol abuse   Acute respiratory failure with hypoxia ([facility])   Vomiting   Pressure injury of skin   Small bowel obstruction ([facility])   Palliative care encounter   Bowel obstruction ([facility])   Chronic respiratory failure ([facility])   Diastolic dysfunction   Tracheostomy dependent ([place])   Dysphagia   Protein-calorie malnutrition ([place])   Seizures ([place])   Bilateral recurrent inguinal hernia   Consultants:  PCCM  Palliative medicine  General surgery  Neurology  Interventional radiology  Procedures:  EEG  Echocardiogram  Tracheostomy  Antibiotics: Anti-infectives (From admission, onward)   Start     Dose/Rate Route Frequency Ordered Stop   12/19/19 0930  Darunavir-Cobicisctat-Emtricitabine-Tenofovir Alafenamide (SYMTUZA) 800-150-200-10 MG TABS 1 tablet        1 tablet  Oral Daily with breakfast 12/19/19 0840     12/13/19 1000  bictegravir-emtricitabine-tenofovir AF (BIKTARVY) 50-200-25 MG per tablet 1 tablet  Status:  Discontinued        1 tablet Oral Daily 12/12/19 1413 12/19/19 0840   11/17/19 1548  ceFAZolin (ANCEF) 2-4 GM/100ML-% IVPB       Note to Pharmacy: Domenick Bookbinder   : cabinet override      11/17/19 1548 11/18/19 0359   11/16/19 1515  ceFAZolin (ANCEF) IVPB 2g/100 mL premix        2 g 200 mL/hr over 30 Minutes Intravenous To Radiology 11/16/19 1511 11/17/19 1625   11/15/19 0900  sulfamethoxazole-trimethoprim (BACTRIM DS) 800-160 MG per tablet 1 tablet        1 tablet Per Tube Once per day on Mon Wed Fri 11/13/19 1906     11/13/19 1615  dolutegravir (TIVICAY) tablet 50 mg  Status:  Discontinued        50 mg Oral Daily 11/13/19 1521 12/12/19 1413   11/13/19 1615  emtricitabine-tenofovir AF (DESCOVY) 200-25 MG per tablet 1 tablet  Status:  Discontinued        1 tablet Per Tube Daily 11/13/19 1521 12/12/19 1413   11/13/19 1530  sulfamethoxazole-trimethoprim (BACTRIM DS)  800-160 MG per tablet 1 tablet  Status:  Discontinued        1 tablet Oral Once per day on Mon Wed Fri 11/13/19 1441 11/13/19 1906   11/13/19 1500  bictegravir-emtricitabine-tenofovir AF (BIKTARVY) 50-200-25 MG per tablet 1 tablet  Status:  Discontinued        1 tablet Oral Daily 11/13/19 1403 11/13/19 1521   11/10/19 1000  erythromycin 250 mg in sodium chloride 0.9 % 100 mL IVPB        250 mg 100 mL/hr over 60 Minutes Intravenous Every 8 hours 11/10/19 0907 11/11/19 2000   11/07/19 1200  ceFEPIme (MAXIPIME) 2 g in sodium chloride 0.9 % 100 mL IVPB        2 g 200 mL/hr over 30 Minutes Intravenous Every 8 hours 11/07/19 1013 11/13/19 2127   11/03/19 1200  cefTRIAXone (ROCEPHIN) 2 g in sodium chloride 0.9 % 100 mL IVPB        2 g 200 mL/hr over 30 Minutes Intravenous Every 24 hours 11/03/19 0922 11/05/19 1427   11/02/19 0800  vancomycin (VANCOREADY) IVPB 750 mg/150 mL  Status:  Discontinued        750 mg 150 mL/hr over 60 Minutes Intravenous Every 12 hours 11/01/19 1935 11/02/19 1105   11/02/19 0600  piperacillin-tazobactam (ZOSYN) IVPB 3.375 g  Status:  Discontinued        3.375 g 12.5 mL/hr over 240 Minutes Intravenous Every 8 hours 11/02/19 0311 11/03/19 0920   11/01/19 2200  ceFEPIme (MAXIPIME) 2 g in sodium chloride 0.9 % 100 mL IVPB  Status:  Discontinued        2 g 200 mL/hr over 30 Minutes Intravenous Every 12 hours 11/01/19 2143 11/02/19 0311   11/01/19 1915  vancomycin (VANCOREADY) IVPB 1500 mg/300 mL        1,500 mg 150 mL/hr over 120 Minutes Intravenous  Once 11/01/19 1909 11/01/19 2147   11/01/19 1845  cefTRIAXone (ROCEPHIN) 1 g in sodium chloride 0.9 % 100 mL IVPB        1 g 200 mL/hr over 30 Minutes Intravenous  Once 11/01/19 1842 11/01/19 2051   11/01/19 1845  azithromycin (ZITHROMAX) 500 mg in sodium chloride 0.9 % 250 mL IVPB  500 mg 250 mL/hr over 60 Minutes Intravenous  Once 11/01/19 1842 11/01/19 2051       Time spent: 25 minutes    [place]  ANP  [facility]  Pager [phone].  01/06/2020, 11:08 AM  LOS: 66 days

## 2020-01-07 LAB — GLUCOSE, CAPILLARY
Glucose-Capillary: 166 mg/dL — ABNORMAL HIGH (ref 70–99)
Glucose-Capillary: 121 mg/dL — ABNORMAL HIGH (ref 70–99)
Glucose-Capillary: 105 mg/dL — ABNORMAL HIGH (ref 70–99)
Glucose-Capillary: 109 mg/dL — ABNORMAL HIGH (ref 70–99)
Glucose-Capillary: 113 mg/dL — ABNORMAL HIGH (ref 70–99)
Glucose-Capillary: 120 mg/dL — ABNORMAL HIGH (ref 70–99)

## 2020-01-07 NOTE — Progress Notes (Signed)
[facility] PROGRESS NOTE  Ryan Orozco GUR:[id number] DOB: 02/19/1965 DOA: 11/01/2019 PCP: Default, Provider, MD    Status: Remains inpatient appropriate because:Altered mental status, Unsafe d/c plan and Inpatient level of care appropriate due to severity of illness. Patient newly diagnosed with anoxic brain injury  Dispo: The patient is from: Home              Anticipated d/c is to: SNF              Anticipated d/c date is: > 3 days              Patient currently is medically stable to d/c.  PMV training in process. Medicaid application in process. Patient will also need to have disability paperwork completed since do not expect full recovery.  Trach Tube has been in place greater than 30 days.   Code Status: Full. Family Communication:  Communicated on 11/24  with daughter at bedside; she is in agreement for patient being placed locally in SNF and is hopeful that over time he can be transferred to an SNF in the [place] area DVT prophylaxis: Subcutaneous heparin Vaccination status: Does not appear to have received Covid vaccine but will need to confirm with family  Foley catheter: No  HPI: 54 year old male with HIV, chronic alcohol abuse who was presented to the emergency department after being found unresponsive outside a convenient store.  EMS found him pulseless in PEA, unknown downtime, successfully resuscitated and brought to the emergency department.  UDS positive for THC/benzodiazepines.  CT head unremarkable.  Failed extubation trials due to severe anoxic brain injury and had tracheostomy placed.  PEG placed on 10/15.    Significant Hospital Events   9/29 Admit post PEA arrest. UDS positive for THC, benzo's. ETOH 177 10/05 EEG ongoing. Versed restarted overnight due to agitation. Nicardipine increased.  10/06 Developed tachypnea with WUA, no follow commands off sedation  10/07 Vomiting, TF held / restarted with recurrent vomiting 10/16 tracheostomy collar for 9  hours 10/18->10/19 off vent all night  10/20> TC 11/15 > downsized to #4 Shiley flex CFS  Subjective: Patient was seen and examined in bed side.  No overnight events.  There is no significant change in patient's condition from yesterday.  Patient is alert and arousable, lying comfortably on the bed.  Objective: Vitals:   01/07/20 0516 01/07/20 0815  BP: 114/73   Pulse: 91 90  Resp: 18 (!) 26  Temp: 99.3 F (37.4 C)   SpO2: 98% 96%   No intake or output data in the 24 hours ending 01/07/20 1132 Filed Weights   01/03/20 0125 01/05/20 0436 01/06/20 0411  Weight: 71.6 kg 72.1 kg 72.4 kg    Exam: Constitutional: Appears calm and comfortable,  not in any distress. Respiratory: Clear to auscultation bilaterally, #4 cuffless trach with 21% FiO2/TC; modest tracheal secretions . Cardiovascular: Regular rate and rhythm, skin warm and dry Abdomen: non tender, bowel sounds positive. PEG tube with tube feeding. Musculoskeletal: Moderate upper extremity dystonic movements with activity.  Now noted with rhythmic head-bobbing only when awake and active not present while sleeping.  Keeps legs flexed at hips right more so than left.  Extremities with generalized hypertonicity. Skin: Several decubiti as described below Neurologic:  Unable to assess.  Psychiatric:  oriented times name.  Difficult to assess secondary to expressive aphasia from brain injury   Assessment/Plan: Acute problems: Anoxic brain injury 2/2 PEA arrest:  -Presumed 2/2 combination alcohol and BZD overdose with UDS positive for  THC and BZDs -Do not expect he will recover fully from this event therefore long-term SNF placement recommended -Continue PT/OT/SLP -12/2 baclofen increased to 20 mg TID PT informed this writer that patient actually having increased tonicity.  Will DC baclofen in favor of combination of Skelaxin 400 mg TID  -12/2 Seroquel decreased to 12.5 mg in context of rhythmic head-bobbing i.e. concerns over  possible EPS symptoms.  Head-bobbing not present while sleeping -Continue scheduled Tylenol for any pain associated with dystonic movements -Continue Seroquel for sequelae from brain injury manifested by mild nocturnal agitation -Continue current placement of bed in room to allow for patient to see outside of door into hallway to minimize isolation  Acute hypoxemic respiratory failure/new tracheostomy tube requirement -Continue PMV training per SLP -#4 cuffless trach-capping trial at discretion of trach team -Tracheostomy tube placed on 10/3  -11/29 was evaluated by trach team; because of persistent encephalopathy and mild to modest secretions currently not a candidate for decannulation at this time.  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   -Continue Keppra; levetiracetam level 35.9 on 11/12 -no further seizure activity since transitioned out of ICU setting  Dysphagia 2/2 anoxic brain injury -Continue tube feeding per PEG -Repeat SLP evaluation on 11/23 with recommendation for n.p.o. status  Goals of care:  -Palliative care was involved during this hospitalization.   -Goals of care were discussed. remains full code.  - Palliative care recommended outpatient follow-up with palliative providers.  Protein calorie malnutrition nutrition Status: Nutrition Problem: Increased nutrient needs Etiology: acute illness Signs/Symptoms: estimated needs Interventions: Tube feeding consisting of Osmolite 1.5 at 60 cc/h, Juven twice daily, Prosource TF 45 mL 3 times daily Estimated body mass index  is 25.78 kg/m as calculated from the following:   Height as of this encounter: 5' 6"  (1.676 m).   Weight as of this encounter: 72.4 kg.   Multiple decubiti not POA Wound / Incision (Open or Dehisced) 11/17/19 Puncture Abdomen Left;Anterior;Upper G-tube insertion site  (Active)  Date First Assessed/Time First Assessed: 11/17/19 1646   Wound Type: Puncture  Location: Abdomen  Location Orientation: Left;Anterior;Upper  Wound Description (Comments): G-tube insertion site   Present on Admission: No    Assessments 11/17/2019  4:47 PM 01/04/2020  7:49 PM  Dressing Type Gauze (Comment);Tape dressing --  Dressing Changed New --  Dressing Status Clean;Dry;Intact Clean;Dry;Intact  Dressing Change Frequency PRN PRN  Site / Wound Assessment Clean;Dry;Pink Clean;Dry  Peri-wound Assessment Intact --  Margins -- Unattached edges (unapproximated)  Closure None None  Drainage Amount None Minimal  Treatment Cleansed --     No Linked orders to display   Other problems: Hypertension/grade 1 diastolic dysfunction:  -Blood pressure stable on amlodipine  -Echocardiogram this admission with preserved LVEF with evidence of grade 1 diastolic dysfunction. No physiology currently consistent with heart failure  HIV:  -CD4 count of 124.  -Dc'd Tivicay and Descovy infavor of Biktarvy to for eventual dc to SNF -Continue prophylactic Bactrim  Inguinal hernias -Evaluated by general surgery this admission. Documented as chronically incarcerated. Patient has been clinically stable and tolerating tube feedings and having bowel movements so unless  patient obstructed would not be considered a candidate for repair. Even if obstructed consulting surgeon was not sure he would be a candidate for hernia repair regardless .   Data Reviewed: Basic Metabolic Panel: Recent Labs  Lab 01/05/20 0757  NA 140  K 4.4  CL 104  CO2 24  GLUCOSE 122*  BUN 19  CREATININE 0.66  CALCIUM 10.2   Liver Function Tests: No  results for input(s): AST, ALT, ALKPHOS, BILITOT, PROT, ALBUMIN in the last 168 hours. No results for input(s): LIPASE, AMYLASE in the last 168 hours. No results for input(s): AMMONIA in the last 168 hours. CBC: Recent Labs  Lab 01/05/20 0757  WBC 3.0*  NEUTROABS 1.7  HGB 15.7  HCT 48.7  MCV 95.3  PLT 164   Cardiac Enzymes: No results for input(s): CKTOTAL, CKMB, CKMBINDEX, TROPONINI in the last 168 hours. BNP (last 3 results) No results for input(s): BNP in the last 8760 hours.  ProBNP (last 3 results) No results for input(s): PROBNP in the last 8760 hours.  CBG: Recent Labs  Lab 01/06/20 2041 01/07/20 0203 01/07/20 0557 01/07/20 0730 01/07/20 1058  GLUCAP 101* 166* 121* 105* 109*    No results found for this or any previous visit (from the past 240 hour(s)).   Studies: No results found.  Scheduled Meds: . acetaminophen (TYLENOL) oral liquid 160 mg/5 mL  650 mg Per Tube Q6H  . amLODipine  10 mg Per Tube Daily  . chlorhexidine gluconate (MEDLINE KIT)  15 mL Mouth Rinse BID  . Darunavir-Cobicisctat-Emtricitabine-Tenofovir Alafenamide  1 tablet Oral Q breakfast  . diclofenac Sodium  4 g Topical QID  . famotidine  10.4 mg Per Tube BID  . feeding supplement (PROSource TF)  45 mL Per Tube TID  . folic acid  1 mg Per Tube Daily  . free water  150 mL Per Tube Q4H  . heparin injection (subcutaneous)  5,000 Units Subcutaneous Q8H  . levETIRAcetam  1,500 mg Per Tube BID  . metaxalone  400 mg Per Tube TID  . nutrition supplement (JUVEN)  1 packet Per Tube BID BM  . nystatin   Topical TID  . QUEtiapine  12.5 mg Per Tube QHS  . scopolamine  1 patch Transdermal Q72H  . sulfamethoxazole-trimethoprim  1 tablet Per Tube Once per day on Mon Wed Fri  . thiamine  100 mg Per Tube Daily   Continuous Infusions: . feeding supplement (OSMOLITE 1.5 CAL) 1,000 mL (01/06/20 0411)    Active Problems:   Cardiac arrest ([place])   Community acquired pneumonia of left upper lobe of lung    Anoxic brain injury ([place])   Alcohol abuse   Acute respiratory failure with hypoxia (H[facility])   Vomiting   Pressure injury of skin   Small bowel obstruction ([facility])   Palliative care encounter   Bowel obstruction ([facility])   Chronic respiratory failure (H[facility])   Diastolic dysfunction   Tracheostomy dependent ([place])   Dysphagia   Protein-calorie malnutrition ([place])   Seizures ([place])   Bilateral recurrent inguinal hernia   Consultants:  PCCM  Palliative medicine  General surgery  Neurology  Interventional radiology  Procedures:  EEG  Echocardiogram  Tracheostomy  Antibiotics: Anti-infectives (From admission, onward)   Start     Dose/Rate Route Frequency Ordered Stop   12/19/19 0930  Darunavir-Cobicisctat-Emtricitabine-Tenofovir Alafenamide (SYMTUZA) 800-150-200-10 MG TABS 1 tablet        1 tablet Oral Daily with breakfast 12/19/19 0840     12/13/19 1000  bictegravir-emtricitabine-tenofovir  AF (BIKTARVY) 50-200-25 MG per tablet 1 tablet  Status:  Discontinued        1 tablet Oral Daily 12/12/19 1413 12/19/19 0840   11/17/19 1548  ceFAZolin (ANCEF) 2-4 GM/100ML-% IVPB       Note to Pharmacy: Domenick Bookbinder   : cabinet override      11/17/19 1548 11/18/19 0359   11/16/19 1515  ceFAZolin (ANCEF) IVPB 2g/100 mL premix        2 g 200 mL/hr over 30 Minutes Intravenous To Radiology 11/16/19 1511 11/17/19 1625   11/15/19 0900  sulfamethoxazole-trimethoprim (BACTRIM DS) 800-160 MG per tablet 1 tablet        1 tablet Per Tube Once per day on Mon Wed Fri 11/13/19 1906     11/13/19 1615  dolutegravir (TIVICAY) tablet 50 mg  Status:  Discontinued        50 mg Oral Daily 11/13/19 1521 12/12/19 1413   11/13/19 1615  emtricitabine-tenofovir AF (DESCOVY) 200-25 MG per tablet 1 tablet  Status:  Discontinued        1 tablet Per Tube Daily 11/13/19 1521 12/12/19 1413   11/13/19 1530  sulfamethoxazole-trimethoprim (BACTRIM DS) 800-160 MG per tablet 1 tablet  Status:  Discontinued        1 tablet  Oral Once per day on Mon Wed Fri 11/13/19 1441 11/13/19 1906   11/13/19 1500  bictegravir-emtricitabine-tenofovir AF (BIKTARVY) 50-200-25 MG per tablet 1 tablet  Status:  Discontinued        1 tablet Oral Daily 11/13/19 1403 11/13/19 1521   11/10/19 1000  erythromycin 250 mg in sodium chloride 0.9 % 100 mL IVPB        250 mg 100 mL/hr over 60 Minutes Intravenous Every 8 hours 11/10/19 0907 11/11/19 2000   11/07/19 1200  ceFEPIme (MAXIPIME) 2 g in sodium chloride 0.9 % 100 mL IVPB        2 g 200 mL/hr over 30 Minutes Intravenous Every 8 hours 11/07/19 1013 11/13/19 2127   11/03/19 1200  cefTRIAXone (ROCEPHIN) 2 g in sodium chloride 0.9 % 100 mL IVPB        2 g 200 mL/hr over 30 Minutes Intravenous Every 24 hours 11/03/19 0922 11/05/19 1427   11/02/19 0800  vancomycin (VANCOREADY) IVPB 750 mg/150 mL  Status:  Discontinued        750 mg 150 mL/hr over 60 Minutes Intravenous Every 12 hours 11/01/19 1935 11/02/19 1105   11/02/19 0600  piperacillin-tazobactam (ZOSYN) IVPB 3.375 g  Status:  Discontinued        3.375 g 12.5 mL/hr over 240 Minutes Intravenous Every 8 hours 11/02/19 0311 11/03/19 0920   11/01/19 2200  ceFEPIme (MAXIPIME) 2 g in sodium chloride 0.9 % 100 mL IVPB  Status:  Discontinued        2 g 200 mL/hr over 30 Minutes Intravenous Every 12 hours 11/01/19 2143 11/02/19 0311   11/01/19 1915  vancomycin (VANCOREADY) IVPB 1500 mg/300 mL        1,500 mg 150 mL/hr over 120 Minutes Intravenous  Once 11/01/19 1909 11/01/19 2147   11/01/19 1845  cefTRIAXone (ROCEPHIN) 1 g in sodium chloride 0.9 % 100 mL IVPB        1 g 200 mL/hr over 30 Minutes Intravenous  Once 11/01/19 1842 11/01/19 2051   11/01/19 1845  azithromycin (ZITHROMAX) 500 mg in sodium chloride 0.9 % 250 mL IVPB        500 mg 250 mL/hr over 60 Minutes Intravenous  Once 11/01/19 1842 11/01/19 2051       Time spent: 25 minutes    Ryan Orozco [facility] MD  [facility]  Pager [phone].  01/07/2020, 11:32 AM  LOS: 67 days

## 2020-01-08 ENCOUNTER — Inpatient Hospital Stay (HOSPITAL_COMMUNITY): Payer: Medicaid Other

## 2020-01-08 LAB — GLUCOSE, CAPILLARY
Glucose-Capillary: 119 mg/dL — ABNORMAL HIGH (ref 70–99)
Glucose-Capillary: 113 mg/dL — ABNORMAL HIGH (ref 70–99)
Glucose-Capillary: 138 mg/dL — ABNORMAL HIGH (ref 70–99)
Glucose-Capillary: 131 mg/dL — ABNORMAL HIGH (ref 70–99)
Glucose-Capillary: 140 mg/dL — ABNORMAL HIGH (ref 70–99)
Glucose-Capillary: 101 mg/dL — ABNORMAL HIGH (ref 70–99)
Glucose-Capillary: 169 mg/dL — ABNORMAL HIGH (ref 70–99)

## 2020-01-08 LAB — CBC WITH DIFFERENTIAL/PLATELET
Abs Immature Granulocytes: 0.02 10*3/uL (ref 0.00–0.07)
Basophils Absolute: 0 10*3/uL (ref 0.0–0.1)
Basophils Relative: 1 %
Eosinophils Absolute: 0.1 10*3/uL (ref 0.0–0.5)
Eosinophils Relative: 3 %
HCT: 45.1 % (ref 39.0–52.0)
Hemoglobin: 14.6 g/dL (ref 13.0–17.0)
Immature Granulocytes: 1 %
Lymphocytes Relative: 17 %
Lymphs Abs: 0.7 10*3/uL (ref 0.7–4.0)
MCH: 30.7 pg (ref 26.0–34.0)
MCHC: 32.4 g/dL (ref 30.0–36.0)
MCV: 94.9 fL (ref 80.0–100.0)
Monocytes Absolute: 0.5 10*3/uL (ref 0.1–1.0)
Monocytes Relative: 14 %
Neutro Abs: 2.5 10*3/uL (ref 1.7–7.7)
Neutrophils Relative %: 64 %
Platelets: 193 10*3/uL (ref 150–400)
RBC: 4.75 MIL/uL (ref 4.22–5.81)
RDW: 13.1 % (ref 11.5–15.5)
WBC: 3.9 10*3/uL — ABNORMAL LOW (ref 4.0–10.5)
nRBC: 0 % (ref 0.0–0.2)

## 2020-01-08 MED ORDER — OSMOLITE 1.5 CAL PO LIQD
474.0000 mL | Freq: Three times a day (TID) | ORAL | Status: DC
  Administered 2020-01-08 – 2020-02-08 (×92): 474 mL
  Administered 2020-02-08: 476 mL
  Administered 2020-02-08 – 2020-04-01 (×161): 474 mL
  Filled 2020-01-08 (×255): qty 474

## 2020-01-08 MED ORDER — OSMOLITE 1.5 CAL PO LIQD
237.0000 mL | Freq: Every day | ORAL | Status: DC
  Filled 2020-01-08: qty 237

## 2020-01-08 MED ORDER — OSMOLITE 1.5 CAL PO LIQD
237.0000 mL | ORAL | Status: DC
  Administered 2020-01-08 – 2020-03-31 (×84): 237 mL
  Filled 2020-01-08 (×90): qty 237

## 2020-01-08 MED ORDER — METAXALONE 800 MG PO TABS
800.0000 mg | ORAL_TABLET | Freq: Three times a day (TID) | ORAL | Status: DC
  Administered 2020-01-08 – 2020-01-09 (×4): 800 mg
  Filled 2020-01-08 (×6): qty 1

## 2020-01-08 MED ORDER — PROSOURCE TF PO LIQD
45.0000 mL | Freq: Every day | ORAL | Status: DC
  Administered 2020-01-09 – 2020-01-19 (×11): 45 mL
  Filled 2020-01-08 (×11): qty 45

## 2020-01-08 MED ORDER — OSMOLITE 1.5 CAL PO LIQD
474.0000 mL | Freq: Three times a day (TID) | ORAL | Status: DC
  Filled 2020-01-08: qty 474

## 2020-01-08 MED ORDER — JEVITY 1.5 CAL/FIBER PO LIQD
237.0000 mL | ORAL | Status: DC
  Filled 2020-01-08: qty 237

## 2020-01-08 MED ORDER — JEVITY 1.5 CAL/FIBER PO LIQD
474.0000 mL | Freq: Three times a day (TID) | ORAL | Status: DC
  Filled 2020-01-08: qty 474

## 2020-01-08 NOTE — Progress Notes (Signed)
NAMETyre Orozco, MRN:  [id number], DOB:  Jun 25, 1965, LOS: 68 ADMISSION DATE:  11/01/2019, CONSULTATION DATE:  9/29 REFERRING MD:  EDP, CHIEF COMPLAINT:  Cardiac arrest   Brief History   54 y/o male with HIV found down outside a convenience store on 9/27, received out of hospital CPR.  After ICU admission has had acute encephalopathy, concern for anoxic brain injury.  Received a tracheostomy 10/13.    Past Medical History  ETOH Abuse HIV - dx 1995  Significant Hospital Events   9/29 Admit post PEA arrest.  UDS positive for THC, benzo's.  ETOH 177 10/05 EEG ongoing. Versed restarted overnight due to agitation. Nicardipine increased.  10/06 Developed tachypnea with WUA, no follow commands off sedation  10/07 Vomiting, TF held / restarted with recurrent vomiting 10/16 tracheostomy collar for 9 hours 10/18->10/19 off vent all night  10/20> TC 11/15>>#4 shiley cuffless 11/22 start capping trials Consults:  Neurology  Procedures:  ETT 9/29 >>10/13 Tracheostomy>>10/3  Significant Diagnostic Tests:  CT head 9/29 >  No evidence of acute intracranial abnormality. Moderate generalized cerebral atrophy, advanced for age. Chronic medially displaced fracture deformity of the right lamina papyracea. Mild ethmoid and maxillary sinus mucosal thickening.  CT cervical spine 9/29 > No evidence of acute fracture to the cervical spine. Partially imaged airspace disease within the imaged lung apices (extensive on the left). Clinical correlation is recommended. 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  EEG 9/29 > Patient was noted to have frequent episodes of axial jerking with eye opening. Concomitant EEG showed generalized polyspikes consistent with myoclonic seizures. EEG also showed continuous generalized background suppression. EEG was not reactive to tactile stimulation. Hyperventilation and photic stimulation  were not performed  ECHO 9/30 > No evidence of wall motion abnormality  MRI 10/3 > Mild DWI hyperintensity involving the caudate and possibly putamen bilaterally. Additionally, possible subtle FLAIR hyperintensity involving the cerebellum, cortex diffusely, and bilateral basal ganglia. Although not diagnostic, these findings could be seen with early hypoxic/ischemic brain injury in this patient status post PEA arrest.   EEG 10/08> severe diffuse encephalopathy likely related to anoxic/hypoxic brain injury  Testicular US 10/10> showed large left hydrocele and left inguinal hernia. No evidence of testicular mass.  MRI brain 10/11: evolving hypoxic, anoxic brain injury compared to previous MRI   CT abdomen pelvis 10/12: Right inguinal hernia and partial small bowel obstruction, large left inguinal hernia  Abdominal x-ray: Small bowel obstruction  Micro Data:  BCx2 9/29 >> negative  Urine 9/29 >> UA few bacteria, WBC, RBC, CaOxalate, and Mucus Covid, Flu A/B 9/29 >> negative Tracheal aspirate 10/5 >> normal flora   Antimicrobials:  Vancomycin 9/29 >> 9/30 Cefepime 9/29 >> 9/30 Cefepime 10/5 >> 10/11  Interim history/subjective:   On ATC MS still limiting - PMV trials supervised Stable secretions  Objective   Blood pressure 106/84, pulse (!) 103, temperature 99.7 F (37.6 C), temperature source Oral, resp. rate 19, height 5\' 6"  (1.676 m), weight 72.4 kg, SpO2 94 %.    FiO2 (%):  [21 %-28 %] 21 %   Intake/Output Summary (Last 24 hours) at 01/08/2020 1132 Last data filed at 01/08/2020 0202 Gross per 24 hour  Intake --  Output 600 ml  Net -600 ml   Filed Weights   01/03/20 0125 01/05/20 0436 01/06/20 0411  Weight: 71.6 kg 72.1 kg 72.4 kg   Physical Exam: General: encephalopathic, chronically ill HEENT: #4 uncuffed  Shiley, secretions present Respiratory: Coarse bilateral breath sounds, no crackles, no wheezes Cardiovascular: Regular, distant, no murmur Extremities:  No edema Neuro: Awake, eyes open, tracks, may intermittently follow commands, did not for me today 12/6.  Moves extremities spontaneously.  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 S/p trach due to failure to wean from ventilator.  Trach in place for airway protection Anoxic brain injury secondary to cardiac arrest c/b myoclonic seizures P: Tolerating ATC.  Still with some secretions but with moderate cough strength.  His encephalopathy makes assessment of his airway protection very challenging.  At this juncture would not consider decannulation.  Any capping/PMV would need to be with direct supervision.   Pulmonary team will follow weekly  Critical care time: n/a minutes   Levy Pupa, MD, PhD 01/08/2020, 11:33 AM [facility] [phone] or if no answer [phone]

## 2020-01-08 NOTE — Progress Notes (Addendum)
Nutrition Follow-up  DOCUMENTATION CODES:   Not applicable  INTERVENTION:  Transition to bolus feeding via PEG: -Provide 2 cartons (483m) Osmolite 1.5 TID @ 0800, 1200, 1700 -Provide 1 carton (2323m Osmolite 1.5 once daily at bedtime @ 2100 -Flush with 6075mree water before and after each tube feeding bolus -Provide 79m65mosource daily  -Nutrisource fiber BID  Tube feeding regimen provides 2525 kcals, 115 grams protein,1267ml16me water (1747ml 19m flushes)  -will d/c juven BID per tube; wounds healed per RN report  NUTRITION DIAGNOSIS:   Increased nutrient needs related to acute illness as evidenced by estimated needs.  ongoing  GOAL:   Patient will meet greater than or equal to 90% of their needs  Met with TF  MONITOR:   Labs, Weight trends, TF tolerance, Skin, I & O's  REASON FOR ASSESSMENT:   Ventilator, Consult Enteral/tube feeding initiation and management  ASSESSMENT:   Patient with PMH significant for HIV and ETOH use. Presents this admission with cardiac arrest and suspected aspiration in setting of heavy ETOH use.  10/07- vomiting, TF held, laterrestarted with recurrent vomiting 10/12-R/L inguinal hernia, partial SBO  10/13- trach 10/15-PEG 10/18- ATC  Per MD, pt continues to be medically stable to d/c. SLP continues to work with pt; however, they have noted that pt is beginning to plateau with his cognitive-linguistic and PMV goals. SLP stated that they will work with pt 1-2 more times but will need to discharge if there has been no functional improvements. Pt is still NPO and receiving TF via PEG. Current TF:OsmGQ:BVQXIHWT 60 ml/hr, ProSource TF 45 ml TID, free water flushes of 150 ml q 4 hours. Will transition pt to bolus feeding regimen as described above and add fiber source. Discussed with RN.  Admission weight: 78.9 kg Current weight: 72.4 kg  UOP: 600ml x82mours  Labs reviewed.  Medications: pepcid, folvite, juven BID,  thiamine  Diet Order:   Diet Order            Diet NPO time specified  Diet effective midnight                 EDUCATION NEEDS:   Not appropriate for education at this time  Skin:  Skin Assessment: Skin Integrity Issues: Skin Integrity Issues:: Other (Comment) Stage II: N/A Stage III: N/A Other: puncture abdomen (PEG insertion site)  Last BM:  12/5 type 7  Height:   Ht Readings from Last 1 Encounters:  11/01/19 5' 6"  (1.676 m)    Weight:   Wt Readings from Last 1 Encounters:  01/06/20 72.4 kg   BMI:  Body mass index is 25.78 kg/m.  Estimated Nutritional Needs:   Kcal:  [id number]:  115-130 g  Fluid:  >/= 2 L/day    Amanda Larkin InaD, LDN RD pager number and weekend/on-call pager number located in [email or web address]

## 2020-01-08 NOTE — Procedures (Signed)
Tracheostomy Change Note  Patient Details:   Name: Ryan Orozco DOB: [birth year] MRN: [id number]    Airway Documentation:     Evaluation  O2 sats: stable throughout Complications: No apparent complications Patient did tolerate procedure well. Bilateral Breath Sounds: Clear, Diminished   RT x2, changed pt's trach to same #4 SHF CFLS. Pt tolerated well with SVS. Pt had good color change. RT will continue to monitor pt.    Megan Mans 01/08/2020, 4:52 PM

## 2020-01-08 NOTE — Progress Notes (Addendum)
[facility] PROGRESS NOTE  Allin [place] TGP:[id number] DOB: 01-24-1966 DOA: 11/01/2019 PCP: Default, Provider, MD    11/16   Status: Remains inpatient appropriate because:Altered mental status, Unsafe d/c plan and Inpatient level of care appropriate due to severity of illness. Patient newly diagnosed with anoxic brain injury  Dispo: The patient is from: Home              Anticipated d/c is to: SNF              Anticipated d/c date is: > 3 days              Patient currently is medically stable to d/c.  PMV training in process. Medicaid application in process. Patient will also need to have disability paperwork completed since do not expect full recovery.  Trach Tube has been in place greater than 30 days.   Code Status: Full Family Communication: 12/06 dtr [name] by telephone DVT prophylaxis: Subcutaneous heparin Vaccination status: Does not appear to have received Covid vaccine but will need to confirm with family  Foley catheter: No  HPI: 54 year old male with HIV, chronic alcohol abuse who was presented to the emergency department after being found unresponsive outside a convinient store.  EMS found him pulseless in PEA, unknown downtime, successfully resuscitated and brought to the emergency department.  UDS positive for THC/benzodiazepines.  CT head unremarkable.  Failed extubation trials due to severe anoxic brain injury and had tracheostomy placed.  PEG placed on 10/15.    Significant Hospital Events   9/29 Admit post PEA arrest. UDS positive for THC, benzo's. ETOH 177 10/05 EEG ongoing. Versed restarted overnight due to agitation. Nicardipine increased.  10/06 Developed tachypnea with WUA, no follow commands off sedation  10/07 Vomiting, TF held / restarted with recurrent vomiting 10/16 tracheostomy collar for 9 hours 10/18->10/19 off vent all night  10/20> TC 11/15 > downsized to #4 Shiley flex CFS  Subjective: Awake and restless.  Head quite diaphoretic.   Pillow placed behind patient's head in bed repositioned so he could see out of room in the hallway and he calmed down.  He also appeared more calm with multiple providers in room.  Objective: Vitals:   01/08/20 0600 01/08/20 0807  BP: 106/84   Pulse: 100 (!) 103  Resp: 18 19  Temp: 99.7 F (37.6 C)   SpO2: 100% 94%    Intake/Output Summary (Last 24 hours) at 01/08/2020 1019 Last data filed at 01/08/2020 0202 Gross per 24 hour  Intake --  Output 600 ml  Net -600 ml   Filed Weights   01/03/20 0125 01/05/20 0436 01/06/20 0411  Weight: 71.6 kg 72.1 kg 72.4 kg    Exam: Constitutional: NAD, somewhat restless Respiratory: Coarse to auscultation bilaterally, #4 cuffless trach with 21% FiO2/TC; modest tracheal secretions at baseline with currently no secretions noted. Cardiovascular: Regular rate and rhythm, skin warm and dry Abdomen: non tender, bowel sounds positive. PEG tube with tube feeding Musculoskeletal: Moderate upper extremity dystonic movements with activity.  Decrease in rhythmic head-bobbing when awake/active not present while sleeping.  Keeps legs flexed at hips right more so than left.  Extremities with generalized hypertonicity. Skin: Several decubiti as described below Neurologic: CN 2-12 grossly intact; Sensation intact both upper and lower extremities, difficult to test strength in upper extremities but appears to be 2/5 with lower extremities 2-3/5.   Psychiatric:  oriented times name.  Difficult to assess secondary to expressive aphasia from brain injury-today   Assessment/Plan: Acute problems:  Anoxic brain injury 2/2 PEA arrest:  -Presumed 2/2 combination alcohol and BZD overdose with UDS positive for THC and BZDs -Do not expect he will recover fully from this event therefore long-term SNF placement recommended -Continue PT/OT/SLP -12/2 baclofen increased to 20 mg TID PT informed this writer that patient actually having increased tonicity.  Increase Skelaxin to  800 mg TID -12/2 Seroquel decreased to 12.5 mg in context of rhythmic head-bobbing i.e. concerns over possible EPS symptoms.  Head-bobbing not present while sleeping -Continue scheduled Tylenol for any pain associated with dystonic movements -Continue Seroquel for sequelae from brain injury manifested by mild nocturnal agitation -Continue current placement of bed in room to allow for patient to see outside of door into hallway to minimize isolation  Acute hypoxemic respiratory failure/new tracheostomy tube requirement -Hypoxemia now resolved -Continue PMV training per SLP -#4 cuffless trach-capping trial at discretion of trach team -Tracheostomy tube placed on 10/3  -11/29 was evaluated by trach team; because of persistent encephalopathy and mild to modest secretions currently not a candidate for decannulation at this time.  -Continue supervised PMV trials  -Lungs seem more congested today despite lack of secretions from trach.  No significant abnormalities on chest x-ray today and is not hypoxic.  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   -Continue Keppra; levetiracetam level 35.9 on 11/12 -no further seizure activity since transitioned out of ICU setting  Dysphagia 2/2 anoxic brain injury -Continue tube feeding per PEG -Repeat SLP evaluation on 11/23 with recommendation for n.p.o. status  Goals of care:  -Palliative care was  involved during this hospitalization.   -Goals of care were discussed. remains full code.  - Palliative care recommended outpatient follow-up with palliative providers.  Protein calorie malnutrition nutrition Status: Nutrition Problem: Increased nutrient needs Etiology: acute illness Signs/Symptoms: estimated needs Interventions: Tube feeding consisting of Osmolite 1.5 at 60 cc/h, Juven twice daily, Prosource TF 45 mL 3 times daily Estimated body mass index is 25.78 kg/m as calculated from the following:   Height as of this encounter: _0  (1.676 m).   Weight as of this encounter: 72.4 kg.   Multiple decubiti not POA Wound / Incision (Open or Dehisced) 11/17/19 Puncture Abdomen Left;Anterior;Upper G-tube insertion site  (Active)  Date First Assessed/Time First Assessed: 11/17/19 1646   Wound Type: Puncture  Location: Abdomen  Location Orientation: Left;Anterior;Upper  Wound Description (Comments): G-tube insertion site   Present on Admission: No    Assessments 11/17/2019  4:47 PM 01/04/2020  7:49 PM  Dressing Type Gauze (Comment);Tape dressing --  Dressing Changed New --  Dressing Status Clean;Dry;Intact Clean;Dry;Intact  Dressing Change Frequency PRN PRN  Site / Wound Assessment Clean;Dry;Pink Clean;Dry  Peri-wound Assessment Intact --  Margins -- Unattached edges (unapproximated)  Closure None None  Drainage Amount None Minimal  Treatment Cleansed --     No Linked orders to display      Other problems: Hypertension/grade 1 diastolic dysfunction:  -Blood pressure stable on amlodipine  -Echocardiogram this  admission with preserved LVEF with evidence of grade 1 diastolic dysfunction. No physiology currently consistent with heart failure  HIV:  -CD4 count of 124.  -Dc'd Tivicay and [place]covy infavor of Biktarvy to for eventual dc to SNF -Continue prophylactic Bactrim  Inguinal hernias -Evaluated by general surgery this admission. Documented as chronically incarcerated.  Patient has been clinically stable and tolerating tube feedings and having bowel movements so unless patient obstructed would not be considered a candidate for repair. Even if obstructed consulting surgeon was not sure he would be a candidate for hernia repair regardless .   Data Reviewed: Basic Metabolic Panel: Recent Labs  Lab 01/05/20 0757  NA 140  K 4.4  CL 104  CO2 24  GLUCOSE 122*  BUN 19  CREATININE 0.66  CALCIUM 10.2   Liver Function Tests: No results for input(s): AST, ALT, ALKPHOS, BILITOT, PROT, ALBUMIN in the last 168 hours. No results for input(s): LIPASE, AMYLASE in the last 168 hours. No results for input(s): AMMONIA in the last 168 hours. CBC: Recent Labs  Lab 01/05/20 0757 01/08/20 0844  WBC 3.0* 3.9*  NEUTROABS 1.7 2.5  HGB 15.7 14.6  HCT 48.7 45.1  MCV 95.3 94.9  PLT 164 193   Cardiac Enzymes: No results for input(s): CKTOTAL, CKMB, CKMBINDEX, TROPONINI in the last 168 hours. BNP (last 3 results) No results for input(s): BNP in the last 8760 hours.  ProBNP (last 3 results) No results for input(s): PROBNP in the last 8760 hours.  CBG: Recent Labs  Lab 01/07/20 1058 01/07/20 1613 01/07/20 2327 01/08/20 0408 01/08/20 0747  GLUCAP 109* 113* 120* 113* 138*    No results found for this or any previous visit (from the past 240 hour(s)).   Studies: DG CHEST PORT 1 VIEW  Result Date: 01/08/2020 CLINICAL DATA:  Trach placement. EXAM: PORTABLE CHEST 1 VIEW COMPARISON:  December 05, 2019 FINDINGS: Tracheostomy tube tip between the clavicles. Cardiomediastinal contours are stable. Lung volumes remain mildly decreased with vascular crowding, no signs of consolidative change or gross evidence of pleural effusion. Mild increased interstitial markings throughout LEFT and RIGHT chest. On limited assessment no acute skeletal process. IMPRESSION: Low lung volumes with vascular crowding. Question mild increased interstitial markings without consolidation or  effusion. This could also be due to low lung volumes. Electronically Signed   By: Zetta Bills M.D.   On: 01/08/2020 08:31    Scheduled Meds: . acetaminophen (TYLENOL) oral liquid 160 mg/5 mL  650 mg Per Tube Q6H  . amLODipine  10 mg Per Tube Daily  . chlorhexidine gluconate (MEDLINE KIT)  15 mL Mouth Rinse BID  . Darunavir-Cobicisctat-Emtricitabine-Tenofovir Alafenamide  1 tablet Oral Q breakfast  . diclofenac Sodium  4 g Topical QID  . famotidine  10.4 mg Per Tube BID  . feeding supplement (PROSource TF)  45 mL Per Tube TID  . folic acid  1 mg Per Tube Daily  . free water  150 mL Per Tube Q4H  . heparin injection (subcutaneous)  5,000 Units Subcutaneous Q8H  . levETIRAcetam  1,500 mg Per Tube BID  . metaxalone  800 mg Per Tube TID  . nutrition supplement (JUVEN)  1 packet Per Tube BID BM  . nystatin   Topical TID  . QUEtiapine  12.5 mg Per Tube QHS  . scopolamine  1 patch Transdermal Q72H  . sulfamethoxazole-trimethoprim  1 tablet Per Tube Once per day on Mon Wed Fri  . thiamine  100 mg Per Tube Daily   Continuous  Infusions: . feeding supplement (OSMOLITE 1.5 CAL) 1,000 mL (01/06/20 0411)    Active Problems:   Cardiac arrest ([place])   Community acquired pneumonia of left upper lobe of lung   Anoxic brain injury ([place])   Alcohol abuse   Acute respiratory failure with hypoxia ([facility])   Vomiting   Pressure injury of skin   Small bowel obstruction ([place])   Palliative care encounter   Bowel obstruction ([facility])   Chronic respiratory failure ([place])   Diastolic dysfunction   Tracheostomy dependent ([place])   Dysphagia   Protein-calorie malnutrition ([place])   Seizures ([place])   Bilateral recurrent inguinal hernia   Consultants:  PCCM  Palliative medicine  General surgery  Neurology  Interventional radiology  Procedures:  EEG  Echocardiogram  Tracheostomy  Antibiotics: Anti-infectives (From admission, onward)   Start     Dose/Rate Route Frequency Ordered Stop    12/19/19 0930  Darunavir-Cobicisctat-Emtricitabine-Tenofovir Alafenamide (SYMTUZA) 800-150-200-10 MG TABS 1 tablet        1 tablet Oral Daily with breakfast 12/19/19 0840     12/13/19 1000  bictegravir-emtricitabine-tenofovir AF (BIKTARVY) 50-200-25 MG per tablet 1 tablet  Status:  Discontinued        1 tablet Oral Daily 12/12/19 1413 12/19/19 0840   11/17/19 1548  ceFAZolin (ANCEF) 2-4 GM/100ML-% IVPB       Note to Pharmacy: Domenick Bookbinder   : cabinet override      11/17/19 1548 11/18/19 0359   11/16/19 1515  ceFAZolin (ANCEF) IVPB 2g/100 mL premix        2 g 200 mL/hr over 30 Minutes Intravenous To Radiology 11/16/19 1511 11/17/19 1625   11/15/19 0900  sulfamethoxazole-trimethoprim (BACTRIM DS) 800-160 MG per tablet 1 tablet        1 tablet Per Tube Once per day on Mon Wed Fri 11/13/19 1906     11/13/19 1615  dolutegravir (TIVICAY) tablet 50 mg  Status:  Discontinued        50 mg Oral Daily 11/13/19 1521 12/12/19 1413   11/13/19 1615  emtricitabine-tenofovir AF (DESCOVY) 200-25 MG per tablet 1 tablet  Status:  Discontinued        1 tablet Per Tube Daily 11/13/19 1521 12/12/19 1413   11/13/19 1530  sulfamethoxazole-trimethoprim (BACTRIM DS) 800-160 MG per tablet 1 tablet  Status:  Discontinued        1 tablet Oral Once per day on Mon Wed Fri 11/13/19 1441 11/13/19 1906   11/13/19 1500  bictegravir-emtricitabine-tenofovir AF (BIKTARVY) 50-200-25 MG per tablet 1 tablet  Status:  Discontinued        1 tablet Oral Daily 11/13/19 1403 11/13/19 1521   11/10/19 1000  erythromycin 250 mg in sodium chloride 0.9 % 100 mL IVPB        250 mg 100 mL/hr over 60 Minutes Intravenous Every 8 hours 11/10/19 0907 11/11/19 2000   11/07/19 1200  ceFEPIme (MAXIPIME) 2 g in sodium chloride 0.9 % 100 mL IVPB        2 g 200 mL/hr over 30 Minutes Intravenous Every 8 hours 11/07/19 1013 11/13/19 2127   11/03/19 1200  cefTRIAXone (ROCEPHIN) 2 g in sodium chloride 0.9 % 100 mL IVPB        2 g 200 mL/hr over 30  Minutes Intravenous Every 24 hours 11/03/19 0922 11/05/19 1427   11/02/19 0800  vancomycin (VANCOREADY) IVPB 750 mg/150 mL  Status:  Discontinued        750 mg 150 mL/hr over 60 Minutes Intravenous Every  12 hours 11/01/19 1935 11/02/19 1105   11/02/19 0600  piperacillin-tazobactam (ZOSYN) IVPB 3.375 g  Status:  Discontinued        3.375 g 12.5 mL/hr over 240 Minutes Intravenous Every 8 hours 11/02/19 0311 11/03/19 0920   11/01/19 2200  ceFEPIme (MAXIPIME) 2 g in sodium chloride 0.9 % 100 mL IVPB  Status:  Discontinued        2 g 200 mL/hr over 30 Minutes Intravenous Every 12 hours 11/01/19 2143 11/02/19 0311   11/01/19 1915  vancomycin (VANCOREADY) IVPB 1500 mg/300 mL        1,500 mg 150 mL/hr over 120 Minutes Intravenous  Once 11/01/19 1909 11/01/19 2147   11/01/19 1845  cefTRIAXone (ROCEPHIN) 1 g in sodium chloride 0.9 % 100 mL IVPB        1 g 200 mL/hr over 30 Minutes Intravenous  Once 11/01/19 1842 11/01/19 2051   11/01/19 1845  azithromycin (ZITHROMAX) 500 mg in sodium chloride 0.9 % 250 mL IVPB        500 mg 250 mL/hr over 60 Minutes Intravenous  Once 11/01/19 1842 11/01/19 2051       Time spent: 20 minutes    Erin Hearing ANP  [facility]  Pager [phone].  01/08/2020, 10:19 AM  LOS: 68 days

## 2020-01-09 LAB — URINALYSIS, ROUTINE W REFLEX MICROSCOPIC
Bilirubin Urine: NEGATIVE
Glucose, UA: NEGATIVE mg/dL
Hgb urine dipstick: NEGATIVE
Ketones, ur: NEGATIVE mg/dL
Leukocytes,Ua: NEGATIVE
Nitrite: NEGATIVE
Protein, ur: NEGATIVE mg/dL
Specific Gravity, Urine: 1.024 (ref 1.005–1.030)
pH: 5 (ref 5.0–8.0)

## 2020-01-09 LAB — GLUCOSE, CAPILLARY
Glucose-Capillary: 85 mg/dL (ref 70–99)
Glucose-Capillary: 127 mg/dL — ABNORMAL HIGH (ref 70–99)
Glucose-Capillary: 174 mg/dL — ABNORMAL HIGH (ref 70–99)
Glucose-Capillary: 157 mg/dL — ABNORMAL HIGH (ref 70–99)

## 2020-01-09 MED ORDER — FREE WATER
200.0000 mL | Status: DC
  Administered 2020-01-09 – 2020-02-15 (×219): 200 mL

## 2020-01-09 MED ORDER — BACLOFEN 20 MG PO TABS
20.0000 mg | ORAL_TABLET | Freq: Three times a day (TID) | ORAL | Status: DC
  Administered 2020-01-09 – 2020-01-10 (×5): 20 mg
  Filled 2020-01-09: qty 1
  Filled 2020-01-09 (×2): qty 2
  Filled 2020-01-09 (×2): qty 1
  Filled 2020-01-09: qty 2
  Filled 2020-01-09 (×2): qty 1
  Filled 2020-01-09: qty 2
  Filled 2020-01-09: qty 1
  Filled 2020-01-09: qty 2

## 2020-01-09 MED ORDER — NUTRISOURCE FIBER PO PACK
1.0000 | PACK | Freq: Two times a day (BID) | ORAL | Status: DC
  Administered 2020-01-09 – 2020-04-08 (×183): 1
  Filled 2020-01-09 (×182): qty 1

## 2020-01-09 NOTE — TOC Progression Note (Addendum)
Transition of Care [facility]) - Progression Note    Patient Details  Name: Avish Torry MRN: [id number] Date of Birth: 1965/05/29  [facility]) CM/SW Contact  Lawerance Sabal, RN Phone Number: 01/09/2020, 10:20 AM  Clinical Narrative:   LVM and emailed Cathlean Marseilles, Financial Counselor, to check on status of Medicaid application. Phylliss Bob she is still trying to get a caseworker for patient and will reach out to [facility] once she knows something further.     Expected Discharge Plan: Long Term Acute Care (LTAC) Barriers to Discharge: Continued Medical Work up  Expected Discharge Plan and Services Expected Discharge Plan: Long Term Acute Care (LTAC)   Discharge Planning Services: CM Consult   Living arrangements for the past 2 months: Single Family Home                                       Social Determinants of Health (SDOH) Interventions    Readmission Risk Interventions No flowsheet data found.

## 2020-01-09 NOTE — Plan of Care (Signed)
  Problem: Education: Goal: Knowledge of General Education information will improve Description: Including pain rating scale, medication(s)/side effects and non-pharmacologic comfort measures Outcome: Progressing   Problem: Health Behavior/Discharge Planning: Goal: Ability to manage health-related needs will improve Outcome: Progressing   Problem: Clinical Measurements: Goal: Ability to maintain clinical measurements within normal limits will improve Outcome: Progressing Goal: Will remain free from infection Outcome: Progressing Goal: Diagnostic test results will improve Outcome: Progressing Goal: Respiratory complications will improve Outcome: Progressing Goal: Cardiovascular complication will be avoided Outcome: Progressing   Problem: Activity: Goal: Risk for activity intolerance will decrease Outcome: Progressing   Problem: Nutrition: Goal: Adequate nutrition will be maintained Outcome: Progressing   Problem: Coping: Goal: Level of anxiety will decrease Outcome: Progressing   Problem: Elimination: Goal: Will not experience complications related to bowel motility Outcome: Progressing Goal: Will not experience complications related to urinary retention Outcome: Progressing   Problem: Pain Managment: Goal: General experience of comfort will improve Outcome: Progressing   Problem: Safety: Goal: Ability to remain free from injury will improve Outcome: Progressing   Problem: Skin Integrity: Goal: Risk for impaired skin integrity will decrease Outcome: Progressing   Problem: Education: Goal: 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 Outcome: Progressing   Problem: Activity: Goal: Ability to tolerate increased activity will improve Outcome: Progressing   Problem: Health Behavior/Discharge Planning: Goal: 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 Outcome: Progressing   Problem: Respiratory: Goal: Patent airway maintenance will  improve Outcome: Progressing   Problem: Role Relationship: Goal: Ability to communicate will improve Outcome: Progressing   Problem: Education: Goal: Knowledge of the prescribed therapeutic regimen Outcome: Progressing Goal: Knowledge of disease or condition will improve Outcome: Progressing   Problem: Clinical Measurements: Goal: Neurologic status will improve Outcome: Progressing   Problem: Tissue Perfusion: Goal: Ability to maintain intracranial pressure will improve Outcome: Progressing   Problem: Respiratory: Goal: Will regain and/or maintain adequate ventilation Outcome: Progressing   Problem: Skin Integrity: Goal: Risk for impaired skin integrity will decrease Outcome: Progressing Goal: Demonstration of wound healing without infection will improve Outcome: Progressing   Problem: Psychosocial: Goal: Ability to verbalize positive feelings about self will improve Outcome: Progressing Goal: Ability to participate in self-care as condition permits will improve Outcome: Progressing Goal: Ability to identify appropriate support needs will improve Outcome: Progressing   Problem: Health Behavior/Discharge Planning: Goal: Ability to manage health-related needs will improve Outcome: Progressing   Problem: Nutritional: Goal: Risk of aspiration will decrease Outcome: Progressing Goal: Dietary intake will improve Outcome: Progressing   Problem: Communication: Goal: Ability to communicate needs accurately will improve Outcome: Progressing   

## 2020-01-09 NOTE — Progress Notes (Signed)
Notified MD of low blood sugar of 85

## 2020-01-09 NOTE — Progress Notes (Addendum)
[facility] PROGRESS NOTE  [place] BTD:[id number] DOB: 1965/02/09 DOA: 11/01/2019 PCP: Default, Provider, MD    11/16   Status: Remains inpatient appropriate because:Altered mental status, Unsafe d/c plan and Inpatient level of care appropriate due to severity of illness. Patient newly diagnosed with anoxic brain injury  Dispo: The patient is from: Home              Anticipated d/c is to: SNF              Anticipated d/c date is: > 3 days              Patient currently is medically stable to d/c.  PMV training in process. Medicaid application in process. Patient will also need to have disability paperwork completed since do not expect full recovery.  Trach Tube has been in place greater than 30 days.   Code Status: Full Family Communication: 12/06 dtr [name] by telephone DVT prophylaxis: Subcutaneous heparin Vaccination status: Does not appear to have received Covid vaccine but will need to confirm with family  Foley catheter: No  HPI: 54 year old male with HIV, chronic alcohol abuse who was presented to the emergency department after being found unresponsive outside a convinient store.  EMS found him pulseless in PEA, unknown downtime, successfully resuscitated and brought to the emergency department.  UDS positive for THC/benzodiazepines.  CT head unremarkable.  Failed extubation trials due to severe anoxic brain injury and had tracheostomy placed.  PEG placed on 10/15.    Significant Hospital Events   9/29 Admit post PEA arrest. UDS positive for THC, benzo's. ETOH 177 10/05 EEG ongoing. Versed restarted overnight due to agitation. Nicardipine increased.  10/06 Developed tachypnea with WUA, no follow commands off sedation  10/07 Vomiting, TF held / restarted with recurrent vomiting 10/16 tracheostomy collar for 9 hours 10/18->10/19 off vent all night  10/20> TC 11/15 > downsized to #4 Shiley flex CFS  Subjective: Very sleepy today.  Awakened briefly but went  back to sleep and does not appear to be agitated today.  Of note did have some mildly low blood sugars overnight  Objective: Vitals:   01/09/20 0517 01/09/20 0815  BP: 124/80   Pulse: 91 88  Resp: 18 17  Temp: 98.5 F (36.9 C)   SpO2: 98% 95%    Intake/Output Summary (Last 24 hours) at 01/09/2020 0842 Last data filed at 01/09/2020 0410 Gross per 24 hour  Intake -  Output 800 ml  Net -800 ml   Filed Weights   01/06/20 0411 01/08/20 2133 01/09/20 0106  Weight: 72.4 kg 72.7 kg 72.7 kg    Exam: Constitutional: NAD, calm Respiratory: Coarse to auscultation bilaterally, #4 cuffless trach with 21% FiO2/TC and was changed out by RT on 12/6; modest tracheal secretions at baseline with currently no secretions noted. Cardiovascular: Regular rate and rhythm, skin warm and dry Abdomen: non tender, bowel sounds positive. PEG tube with tube feeding Musculoskeletal: Exam deferred but at baseline moderate upper extremity dystonic movements with activity.  Decrease in rhythmic head-bobbing when awake/active not present while sleeping.  Keeps legs flexed at hips right more so than left.  Extremities with generalized hypertonicity. Skin: Several decubiti as described below Neurologic: CN 2-12 grossly intact; Sensation intact both upper and lower extremities, difficult to test strength in upper extremities but appears to be 2/5 with lower extremities 2-3/5.   Psychiatric: Very sleepy but currently is oriented times name.  Difficult to assess secondary to expressive aphasia from brain injury  Assessment/Plan: Acute problems: Anoxic brain injury 2/2 PEA arrest:  -Presumed 2/2 combination alcohol and BZD overdose with UDS positive for THC and BZDs -Do not expect he will recover fully from this event therefore long-term SNF placement recommended -Continue PT/OT/SLP -12/2 baclofen increased to 20 mg TID PT informed this writer that patient actually having increased tonicity.No improvement in tonicity  after change to Skelaxin so will resume Baclofen at 20 mg TID and follow closely. -12/2 Seroquel decreased to 12.5 mg in context of rhythmic head-bobbing i.e. concerns over possible EPS symptoms.  Head-bobbing not present while sleeping -Continue scheduled Tylenol for any pain associated with dystonic movements -Continue Seroquel for sequelae from brain injury manifested by mild nocturnal agitation -Continue current placement of bed in room to allow for patient to see outside of door into hallway to minimize isolation  Acute hypoxemic respiratory failure/new tracheostomy tube requirement -Hypoxemia now resolved -Continue PMV training per SLP -#4 cuffless trach-capping trial at discretion of trach team -Tracheostomy tube placed on 10/3  -11/29 was evaluated by trach team; because of persistent encephalopathy and mild to modest secretions currently not a candidate for decannulation at this time.  -Continue supervised PMV trials  -Lungs seem more congested today despite lack of secretions from trach.  No significant abnormalities on chest x-ray today and is not hypoxic.  We will begin Vibra vest therapy  Abnormal appearing urine -Urine appears cloudy-check urinalysis and culture-as of 12/7 has not been collected therefore order placed to allow for I/O catheterization to obtain specimen -CBC unremarkable except for slightly low WBC in context of known HIV disease  Pain syndrome -Continue Voltaren gel and scheduled Tylenol -pain for the most part seems to be controlled with scheduled regimen -Some of the pain appears to be related to increased tonicity and spasticity being treated with baclofen (dose increased on 12/2) -No Ultram 2/2 seizure activity at time of admission -Completed 5 days of scheduled ibuprofen  Dysphagia 2/2 anoxic brain injury -Continue tube feeding per PEG -Repeat SLP evaluation on 11/23 with recommendation for n.p.o. status  Goals of care:  -Palliative care was involved  during this hospitalization.   -Goals of care were discussed. remains full code.  - Palliative care recommended outpatient follow-up with palliative providers.  Protein calorie malnutrition nutrition Status: Nutrition Problem: Increased nutrient needs Etiology: acute illness Signs/Symptoms: estimated needs Interventions: Tube feeding consisting of Osmolite 1.5 at 60 cc/h, Juven twice daily, Prosource TF 45 mL 3 times daily Estimated body mass index is 25.87 kg/m as calculated from the following:   Height as of this encounter: _0  (1.676 m).   Weight as of this encounter: 72.7 kg.   Multiple decubiti not POA Wound / Incision (Open or Dehisced) 11/17/19 Puncture Abdomen Left;Anterior;Upper G-tube insertion site  (Active)  Date First Assessed/Time First Assessed: 11/17/19 1646   Wound Type: Puncture  Location: Abdomen  Location Orientation: Left;Anterior;Upper  Wound Description (Comments): G-tube insertion site   Present on Admission: No    Assessments 11/17/2019  4:47 PM 01/04/2020  7:49 PM  Dressing Type Gauze (Comment);Tape dressing -  Dressing Changed New -  Dressing Status Clean;Dry;Intact Clean;Dry;Intact  Dressing Change Frequency PRN PRN  Site / Wound Assessment Clean;Dry;Pink Clean;Dry  Peri-wound Assessment Intact -  Margins - Unattached edges (unapproximated)  Closure None None  Drainage Amount None Minimal  Treatment Cleansed -     No Linked orders to display      Other problems: Hypertension/grade 1 diastolic dysfunction:  -Blood pressure stable on amlodipine  -  Echocardiogram this admission with preserved LVEF with evidence of grade 1 diastolic dysfunction. 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   -Continue Keppra; levetiracetam level 35.9 on 11/12 -no further seizure activity since transitioned out of ICU setting therefore will discontinue seizure precaution  HIV:  -CD4 count of 124.   -Dc'd Tivicay and Descovy infavor of Biktarvy to for eventual dc to SNF -Continue prophylactic Bactrim  Inguinal hernias -Evaluated by general surgery this admission. Documented as chronically incarcerated. Patient has been clinically stable and tolerating tube feedings and having bowel movements so unless patient obstructed would not be considered a candidate for repair. Even if obstructed consulting surgeon was not sure he would be a candidate for hernia repair regardless .   Data Reviewed: Basic Metabolic Panel: Recent Labs  Lab 01/05/20 0757  NA 140  K 4.4  CL 104  CO2 24  GLUCOSE 122*  BUN 19  CREATININE 0.66  CALCIUM 10.2   Liver Function Tests: No results for input(s): AST, ALT, ALKPHOS, BILITOT, PROT, ALBUMIN in the last 168 hours. No results for input(s): LIPASE, AMYLASE in the last 168 hours. No results for input(s): AMMONIA in the last 168 hours. CBC: Recent Labs  Lab 01/05/20 0757 01/08/20 0844  WBC 3.0* 3.9*  NEUTROABS 1.7 2.5  HGB 15.7 14.6  HCT 48.7 45.1  MCV 95.3 94.9  PLT 164 193   Cardiac Enzymes: No results for input(s): CKTOTAL, CKMB, CKMBINDEX, TROPONINI in the last 168 hours. BNP (last 3 results) No results for input(s): BNP in the last 8760 hours.  ProBNP (last 3 results) No results for input(s): PROBNP in the last 8760 hours.  CBG: Recent Labs  Lab 01/08/20 1707 01/08/20 1933 01/08/20 2311 01/09/20 0317 01/09/20 0808  GLUCAP 140* 101* 169* 85 127*    No results found for this or any previous visit (from the past 240 hour(s)).   Studies: DG CHEST PORT 1 VIEW  Result Date: 01/08/2020 CLINICAL DATA:  Trach placement. EXAM: PORTABLE CHEST 1 VIEW COMPARISON:  December 05, 2019 FINDINGS: Tracheostomy tube tip between the clavicles. Cardiomediastinal contours are stable. Lung volumes remain mildly decreased with vascular crowding, no signs of consolidative change or gross evidence of pleural effusion. Mild increased interstitial  markings throughout LEFT and RIGHT chest. On limited assessment no acute skeletal process. IMPRESSION: Low lung volumes with vascular crowding. Question mild increased interstitial markings without consolidation or effusion. This could also be due to low lung volumes. Electronically Signed   By: Zetta Bills M.D.   On: 01/08/2020 08:31    Scheduled Meds: . acetaminophen (TYLENOL) oral liquid 160 mg/5 mL  650 mg Per Tube Q6H  . amLODipine  10 mg Per Tube Daily  . chlorhexidine gluconate (MEDLINE KIT)  15 mL Mouth Rinse BID  . Darunavir-Cobicisctat-Emtricitabine-Tenofovir Alafenamide  1 tablet Oral Q breakfast  . diclofenac Sodium  4 g Topical QID  . famotidine  10.4 mg Per Tube BID  . feeding supplement (OSMOLITE 1.5 CAL)  237 mL Per Tube Q24H  . feeding supplement (OSMOLITE 1.5 CAL)  474 mL Per Tube TID  . feeding supplement (PROSource TF)  45 mL Per Tube Daily  . fiber  1 packet Per Tube BID  . folic acid  1 mg Per Tube Daily  . free water  150 mL Per Tube Q4H  . heparin injection (subcutaneous)  5,000 Units Subcutaneous Q8H  . levETIRAcetam  1,500 mg Per Tube BID  .  metaxalone  800 mg Per Tube TID  . nystatin   Topical TID  . QUEtiapine  12.5 mg Per Tube QHS  . scopolamine  1 patch Transdermal Q72H  . sulfamethoxazole-trimethoprim  1 tablet Per Tube Once per day on Mon Wed Fri  . thiamine  100 mg Per Tube Daily   Continuous Infusions:   Active Problems:   Cardiac arrest [facility])   Community acquired pneumonia of left upper lobe of lung   Anoxic brain injury ([place])   Alcohol abuse   Acute respiratory failure with hypoxia ([facility])   Vomiting   Pressure injury of skin   Small bowel obstruction ([place])   Palliative care encounter   Bowel obstruction ([facility])   Chronic respiratory failure ([facility])   Diastolic dysfunction   Tracheostomy dependent ([place])   Dysphagia   Protein-calorie malnutrition ([place])   Seizures ([place])   Bilateral recurrent inguinal  hernia   Consultants:  PCCM  Palliative medicine  General surgery  Neurology  Interventional radiology  Procedures:  EEG  Echocardiogram  Tracheostomy  Antibiotics: Anti-infectives (From admission, onward)   Start     Dose/Rate Route Frequency Ordered Stop   12/19/19 0930  Darunavir-Cobicisctat-Emtricitabine-Tenofovir Alafenamide (SYMTUZA) 800-150-200-10 MG TABS 1 tablet        1 tablet Oral Daily with breakfast 12/19/19 0840     12/13/19 1000  bictegravir-emtricitabine-tenofovir AF (BIKTARVY) 50-200-25 MG per tablet 1 tablet  Status:  Discontinued        1 tablet Oral Daily 12/12/19 1413 12/19/19 0840   11/17/19 1548  ceFAZolin (ANCEF) 2-4 GM/100ML-% IVPB       Note to Pharmacy: Domenick Bookbinder   : cabinet override      11/17/19 1548 11/18/19 0359   11/16/19 1515  ceFAZolin (ANCEF) IVPB 2g/100 mL premix        2 g 200 mL/hr over 30 Minutes Intravenous To Radiology 11/16/19 1511 11/17/19 1625   11/15/19 0900  sulfamethoxazole-trimethoprim (BACTRIM DS) 800-160 MG per tablet 1 tablet        1 tablet Per Tube Once per day on Mon Wed Fri 11/13/19 1906     11/13/19 1615  dolutegravir (TIVICAY) tablet 50 mg  Status:  Discontinued        50 mg Oral Daily 11/13/19 1521 12/12/19 1413   11/13/19 1615  emtricitabine-tenofovir AF (DESCOVY) 200-25 MG per tablet 1 tablet  Status:  Discontinued        1 tablet Per Tube Daily 11/13/19 1521 12/12/19 1413   11/13/19 1530  sulfamethoxazole-trimethoprim (BACTRIM DS) 800-160 MG per tablet 1 tablet  Status:  Discontinued        1 tablet Oral Once per day on Mon Wed Fri 11/13/19 1441 11/13/19 1906   11/13/19 1500  bictegravir-emtricitabine-tenofovir AF (BIKTARVY) 50-200-25 MG per tablet 1 tablet  Status:  Discontinued        1 tablet Oral Daily 11/13/19 1403 11/13/19 1521   11/10/19 1000  erythromycin 250 mg in sodium chloride 0.9 % 100 mL IVPB        250 mg 100 mL/hr over 60 Minutes Intravenous Every 8 hours 11/10/19 0907 11/11/19 2000    11/07/19 1200  ceFEPIme (MAXIPIME) 2 g in sodium chloride 0.9 % 100 mL IVPB        2 g 200 mL/hr over 30 Minutes Intravenous Every 8 hours 11/07/19 1013 11/13/19 2127   11/03/19 1200  cefTRIAXone (ROCEPHIN) 2 g in sodium chloride 0.9 % 100 mL IVPB  2 g 200 mL/hr over 30 Minutes Intravenous Every 24 hours 11/03/19 0922 11/05/19 1427   11/02/19 0800  vancomycin (VANCOREADY) IVPB 750 mg/150 mL  Status:  Discontinued        750 mg 150 mL/hr over 60 Minutes Intravenous Every 12 hours 11/01/19 1935 11/02/19 1105   11/02/19 0600  piperacillin-tazobactam (ZOSYN) IVPB 3.375 g  Status:  Discontinued        3.375 g 12.5 mL/hr over 240 Minutes Intravenous Every 8 hours 11/02/19 0311 11/03/19 0920   11/01/19 2200  ceFEPIme (MAXIPIME) 2 g in sodium chloride 0.9 % 100 mL IVPB  Status:  Discontinued        2 g 200 mL/hr over 30 Minutes Intravenous Every 12 hours 11/01/19 2143 11/02/19 0311   11/01/19 1915  vancomycin (VANCOREADY) IVPB 1500 mg/300 mL        1,500 mg 150 mL/hr over 120 Minutes Intravenous  Once 11/01/19 1909 11/01/19 2147   11/01/19 1845  cefTRIAXone (ROCEPHIN) 1 g in sodium chloride 0.9 % 100 mL IVPB        1 g 200 mL/hr over 30 Minutes Intravenous  Once 11/01/19 1842 11/01/19 2051   11/01/19 1845  azithromycin (ZITHROMAX) 500 mg in sodium chloride 0.9 % 250 mL IVPB        500 mg 250 mL/hr over 60 Minutes Intravenous  Once 11/01/19 1842 11/01/19 2051       Time spent: 20 minutes    Erin Hearing ANP  [facility]  Pager [phone].  01/09/2020, 8:42 AM  LOS: 69 days

## 2020-01-09 NOTE — Progress Notes (Signed)
  Speech Language Pathology Treatment: Cognitive-Linquistic;Passy Muir Speaking valve  Patient Details Name: Ryan Orozco MRN: [id number] DOB: 27-Jan-1966 Today's Date: 01/09/2020 Time: 1015-1030 SLP Time Calculation (min) (ACUTE ONLY): 15 min  Assessment / Plan / Recommendation Clinical Impression  Pt has made slow progress toward goals.  Today, he is vocalizing around #4 cuffless trach.  PMV placed and pt wore for 15 minutes with full supervision and excellent toleration.  Verbalizations continue to consist of single vowels that he has difficulty altering/shaping at the simple single syllable level of production - requires max verbal/visual/tactile cues. He followed single step commands (raising eyebrows, extending tongue, etc) with 90% accuracy and without modeling. He was able to discriminate between two objects on 5/5 trials and discriminated among items in room correctly in 4/5 opportunities.  Recommend continuing acute care SLP f/u at a minimum of 1x/week. Continue PMV use with staff and full supervision; remove upon leaving pt alone.    HPI HPI: 54 y/o male with hx of HIV, ETOH abuse found down outside a convenience store on 9/27, received out of hospital CPR. Presents with acute hypoxic respiratory failure s/p cardiac arrest, acute encephalopathy due to hypoxic brain injury. Intubated 9/27-10/13. Received a tracheostomy 10/13, treachestomy collar trial for 9 hours on 10/16. CXR on 10/17 improving and shows left lung opacities have essentially resolved      SLP Plan  Continue with current plan of care       Recommendations   Continue SLP 1x/week      Patient may use Passy-Muir Speech Valve: During all therapies with supervision PMSV Supervision: Full         Oral Care Recommendations: Oral care QID Follow up Recommendations: Skilled Nursing facility SLP Visit Diagnosis: Cognitive communication deficit (R41.841);Aphonia (R49.1) Plan: Continue with current plan of  care       GO                Carolan Shiver 01/09/2020, 10:34 AM  Marchelle Folks L. Samson Frederic, MA CCC/SLP Acute Rehabilitation Services Office number [phone] Pager [phone]

## 2020-01-09 NOTE — Progress Notes (Signed)
Physical Therapy Treatment Patient Details Name: Ryan Orozco MRN: [id number] DOB: 08-05-1965 Today's Date: 01/09/2020    History of Present Illness 54 year old male found down outside convenience store 9/27. Pt with PEA and hypoxic brain injury. Trach 10/13, PEG 10/15. PMHx: HIV AIDS and alcohol abuse    PT Comments    Pt with signs of significant increase in R LE flexion tone since I last saw him nearly a month ago.  NP in room at the beginning of my session stating she will adjust his baclofen.  I asked for OT consult to assist in his care, but specifically to assess for appropriateness of serial casting.  Pt did well EOB once this therapist trialed several different supports from my end.  He did best with hips as far back on the bed as possible while feet still supported on the floor and therapist behind him in the bed better able to control his extension tone when it kicked in on his trunk and left leg (flexion on R, ext on left).  Once I was better able to break his tone/spasms, we could work freely EOB on trunk rotation, cervical rotation, weight shifts, leaning in all directions.  NP also asked about OOB to chair.  This would only be achieved safely to a tilt in space chair with a 4 point safety harness.  I will ask inpatient rehab for their assistance with such a chair.   PT will continue to follow acutely for safe mobility progression.  Follow Up Recommendations  SNF     Equipment Recommendations  Wheelchair (measurements PT);Wheelchair cushion (measurements PT);Hospital bed;Other (comment) (tilt in space 18x18 with 4 point harness for safety)    Recommendations for Other Services       Precautions / Restrictions Precautions Precautions: Fall Precaution Comments: trach, PEG, anoxic, flexor tone R>L LE and will throw into extension tone on the left without warning EOB (be careful not to get too close to the edge)    Mobility  Bed Mobility Overal bed mobility: Needs  Assistance Bed Mobility: Rolling;Sidelying to Sit;Sit to Sidelying Rolling: Total assist Sidelying to sit: Total assist     Sit to sidelying: Total assist General bed mobility comments: Total assist to roll bil for peri care, total assit to come to sitting EOB.  We stayed EOB with HOB max elevated and feet over the side for a few minutes, pt made moves that appeared he was attempting to sit up, but when PT assisted (ultimately assist from behind worked best) he launched into extension tone with the transition to sit (trunk and left leg extended, R leg flexed).  Once I was able to move his trunk a bit in sitting the tone subsided, but would come back seemingly without warning.  I kept his hips back far on the bed to avoid slippage off of the edge when the tone kicked in.    Transfers                 General transfer comment: Discussed with NP that it is not safe with his current restlessness and tone to lift him up OOB to the recliner chair.  He would be safest in a tilt in space WC with a 4 point safety harness.    Ambulation/Gait                 Stairs             Wheelchair Mobility    Modified Rankin (Stroke Patients Only)  Balance Overall balance assessment: Needs assistance Sitting-balance support: Feet supported;No upper extremity supported;Bilateral upper extremity supported Sitting balance-Leahy Scale: Zero Sitting balance - Comments: total assist EOB.  Pt tolerated EOB much longer than I last remember, but I approached his support from behind to help when extension tone kicked in.  Pt tolerated nearly 20 mins EOB working on lateral leans, trunk and cervical rotation, anterior/posterior perterbations.  He seemed to calm with music (therapist singing), so may respond well to music integration into therapy sessions.  Any attempts at even slow, progressive ROM to his right leg were met with moans and grimaces of pain.  I did not realize OT was not involved,  so I consulted them both with their expertise of serial casting which he may need to have a functional R leg and working with BIs.   Postural control: Posterior lean;Right lateral lean                                  Cognition Arousal/Alertness: Awake/alert Behavior During Therapy: Restless Overall Cognitive Status: Impaired/Different from baseline Area of Impairment: Orientation;Attention;Memory;Following commands;Safety/judgement;Awareness;Problem solving                 Orientation Level: Disoriented to;Place;Time;Situation Current Attention Level: Focused Memory: Decreased short-term memory Following Commands: Follows one step commands inconsistently;Follows one step commands with increased time Safety/Judgement: Decreased awareness of safety;Decreased awareness of deficits Awareness: Intellectual Problem Solving: Slow processing;Decreased initiation;Difficulty sequencing;Requires verbal cues;Requires tactile cues General Comments: Pt smiling at times, grimacing to painful tone in R leg, seemingly soothed by PT singing to him (may want to incorporate more music next time).  Pt very inconsistent with command following which I believe is both cognitive and physical inability to do many things.       Exercises Other Exercises Other Exercises: Attempted knee and hip extension on R for several minutes.  Each time I would feel him relax a bit and I would slowly and gently lean in, he would bounce back into flexion (pain response and it triggered the tone).      General Comments General comments (skin integrity, edema, etc.): Pt on 21 % TC 5L throughout session, O2 sats in the 90s HR stable, BP not assess as there were no signs of BP issues in pt's physical presentation.        Pertinent Vitals/Pain Pain Assessment: Faces Faces Pain Scale: Hurts whole lot Pain Location: seems very painful with attempts, even slow attempts at straightening his right leg.  Pain  Descriptors / Indicators: Moaning;Guarding;Grimacing Pain Intervention(s): Limited activity within patient's tolerance;Monitored during session;Repositioned    Home Living                      Prior Function            PT Goals (current goals can now be found in the care plan section) Acute Rehab PT Goals Patient Stated Goal: unable to state Progress towards PT goals: Progressing toward goals    Frequency    Min 2X/week      PT Plan Current plan remains appropriate    Co-evaluation              AM-PAC PT "6 Clicks" Mobility   Outcome Measure  Help needed turning from your back to your side while in a flat bed without using bedrails?: Total Help needed moving from lying on your back to sitting on  the side of a flat bed without using bedrails?: Total Help needed moving to and from a bed to a chair (including a wheelchair)?: Total Help needed standing up from a chair using your arms (e.g., wheelchair or bedside chair)?: Total Help needed to walk in hospital room?: Total Help needed climbing 3-5 steps with a railing? : Total 6 Click Score: 6    End of Session Equipment Utilized During Treatment: Oxygen Activity Tolerance: Patient limited by pain Patient left: in bed;with call bell/phone within reach;with bed alarm set   PT Visit Diagnosis: Other abnormalities of gait and mobility (R26.89);Muscle weakness (generalized) (M62.81)     Time: 3437-3578 PT Time Calculation (min) (ACUTE ONLY): 38 min  Charges:  $Therapeutic Exercise: 8-22 mins $Therapeutic Activity: 8-22 mins $Neuromuscular Re-education: 8-22 mins                    Verdene Lennert, PT, DPT  Acute Rehabilitation [phone] pager [phone]) [phone] office

## 2020-01-10 DIAGNOSIS — M62838 Other muscle spasm: Secondary | ICD-10-CM

## 2020-01-10 LAB — GLUCOSE, CAPILLARY
Glucose-Capillary: 108 mg/dL — ABNORMAL HIGH (ref 70–99)
Glucose-Capillary: 120 mg/dL — ABNORMAL HIGH (ref 70–99)
Glucose-Capillary: 124 mg/dL — ABNORMAL HIGH (ref 70–99)
Glucose-Capillary: 177 mg/dL — ABNORMAL HIGH (ref 70–99)
Glucose-Capillary: 182 mg/dL — ABNORMAL HIGH (ref 70–99)
Glucose-Capillary: 82 mg/dL (ref 70–99)

## 2020-01-10 MED ORDER — MUSCLE RUB 10-15 % EX CREA
TOPICAL_CREAM | Freq: Four times a day (QID) | CUTANEOUS | Status: DC
  Administered 2020-02-06 – 2020-04-06 (×13): 1 via TOPICAL
  Filled 2020-01-10 (×6): qty 85

## 2020-01-10 MED ORDER — GABAPENTIN 250 MG/5ML PO SOLN
100.0000 mg | Freq: Three times a day (TID) | ORAL | Status: DC
  Administered 2020-01-10 – 2020-01-11 (×3): 100 mg
  Filled 2020-01-10 (×5): qty 2

## 2020-01-10 NOTE — Progress Notes (Signed)
[facility] PROGRESS NOTE  Ryan Orozco BZJ:[id number] DOB: [birth year] DOA: 11/01/2019 PCP: Default, Provider, MD    11/16   Status: Remains inpatient appropriate because:Altered mental status, Unsafe d/c plan and Inpatient level of care appropriate due to severity of illness. Patient newly diagnosed with anoxic brain injury  Dispo: The patient is from: Home              Anticipated d/c is to: SNF              Anticipated d/c date is: > 3 days              Patient currently is medically stable to d/c.  PMV training in process. Medicaid application in process. Patient will also need to have disability paperwork completed since do not expect full recovery.  Trach Tube has been in place greater than 30 days.   Code Status: Full Family Communication: 12/06 dtr [name] by telephone DVT prophylaxis: Subcutaneous heparin Vaccination status: Does not appear to have received Covid vaccine but will need to confirm with family  Foley catheter: No  HPI: 54 year old male with HIV, chronic alcohol abuse who was presented to the emergency department after being found unresponsive outside a convinient store.  EMS found him pulseless in PEA, unknown downtime, successfully resuscitated and brought to the emergency department.  UDS positive for THC/benzodiazepines.  CT head unremarkable.  Failed extubation trials due to severe anoxic brain injury and had tracheostomy placed.  PEG placed on 10/15.    Significant Hospital Events   9/29 Admit post PEA arrest. UDS positive for THC, benzo's. ETOH 177 10/05 EEG ongoing. Versed restarted overnight due to agitation. Nicardipine increased.  10/06 Developed tachypnea with WUA, no follow commands off sedation  10/07 Vomiting, TF held / restarted with recurrent vomiting 10/16 tracheostomy collar for 9 hours 10/18->10/19 off vent all night  10/20> TC 11/15 > downsized to #4 Shiley flex CFS  Subjective: Awake and alert for him.  Repeatedly saying  "ow".  Objective: Vitals:   01/10/20 0826 01/10/20 0900  BP:    Pulse: (!) 112 96  Resp: 18 16  Temp:    SpO2: 97% 96%    Intake/Output Summary (Last 24 hours) at 01/10/2020 1244 Last data filed at 01/10/2020 0346 Gross per 24 hour  Intake --  Output 550 ml  Net -550 ml   Filed Weights   01/06/20 0411 01/08/20 2133 01/09/20 0106  Weight: 72.4 kg 72.7 kg 72.7 kg    Exam: Constitutional: NAD, calm Respiratory: Coarse to auscultation bilaterally, #4 cuffless trach with 21% FiO2/TC and was changed out by RT on 12/6; modest tracheal secretions at baseline with currently no secretions noted. Cardiovascular: Regular rate and rhythm, skin warm and dry Abdomen: non tender, bowel sounds positive. PEG tube with tube feeding Musculoskeletal: Exam deferred but at baseline moderate upper extremity dystonic movements with activity.  Decrease in rhythmic head-bobbing when awake/active not present while sleeping.  Keeps legs flexed at hips right more so than left.  According to PT notes has developed significant flexion with evolving contracture secondary to generalized hypertonicity. Skin: Several decubiti as described below Neurologic: CN 2-12 grossly intact; Sensation intact both upper and lower extremities, difficult to test strength in upper extremities but appears to be 2/5 with lower extremities 2-3/5.   Psychiatric: Very sleepy but currently is oriented times name.  Difficult to assess secondary to expressive aphasia from brain injury   Assessment/Plan: Acute problems: Anoxic brain injury 2/2 PEA arrest/pain syndrome secondary  to hypertonicity:  -Presumed 2/2 combination alcohol and BZD overdose with UDS positive for THC and BZDs -Do not expect he will recover fully from this event therefore long-term SNF placement recommended -Continue PT/OT/SLP -Initially tried to utilize baclofen as skeletal muscular relaxant ;12/2  baclofen increased to 20 mg TID but during timeframe patient was  having nocturnal agitation and uncertain if baclofen contributing therefore was discontinued in favor of Skelaxin.  Subsequently PT informed this writer afternoon of 12/7 that patient actually having increased tonicity it was significant pain and evolving contracture specially on right hip.  Resumed Baclofen at 20 mg TID. -12/8 we will add low-dose gabapentin to help with pain from hypertonicity -12/2 Seroquel decreased to 12.5 mg in context of rhythmic head-bobbing i.e. concerns over possible EPS symptoms.  Head-bobbing not present while sleeping -Continue scheduled Tylenol for any pain associated with dystonic movements -Voltaren gel ineffective therefore will transition to CREA muscle rub  Acute hypoxemic respiratory failure/new tracheostomy tube requirement -Hypoxemia now resolved -Continue PMV training per SLP -#4 cuffless trach-capping trial at discretion of trach team -Tracheostomy tube placed on 10/3  -11/29 was evaluated by trach team; because of persistent encephalopathy and mild to modest secretions currently not a candidate for decannulation at this time.  -Continue supervised PMV trials  -Continue pulmonary hygiene with Vibra vest therapy  Abnormal appearing urine -UA consistent with dehydration/hyperconcentration for free water increased per tube -CBC unremarkable except for slightly low WBC in context of known HIV disease  Dysphagia 2/2 anoxic brain injury -Continue tube feeding per PEG -Repeat SLP evaluation on 11/23 with recommendation for n.p.o. status  Goals of care:  -Palliative care was involved during this hospitalization.   -Goals of care were discussed. remains full code.  - Palliative care recommended outpatient follow-up with palliative providers. -May need to revisit end-of-life goals of care since patient making poor progress and currently having significant pain related to hypertonicity from brain injury  Protein calorie malnutrition nutrition  Status: Nutrition Problem: Increased nutrient needs Etiology: acute illness Signs/Symptoms: estimated needs Interventions: Tube feeding consisting of Osmolite 1.5 at 60 cc/h, Juven twice daily, Prosource TF 45 mL 3 times daily Estimated body mass index is 25.87 kg/m as calculated from the following:   Height as of this encounter: 5' 6"  (1.676 m).   Weight as of this encounter: 72.7 kg.   Multiple decubiti not POA Wound / Incision (Open or Dehisced) 11/17/19 Puncture Abdomen Left;Anterior;Upper G-tube insertion site  (Active)  Date First Assessed/Time First Assessed: 11/17/19 1646   Wound Type: Puncture  Location: Abdomen  Location Orientation: Left;Anterior;Upper  Wound Description (Comments): G-tube insertion site   Present on Admission: No    Assessments 11/17/2019  4:47 PM 01/04/2020  7:49 PM  Dressing Type Gauze (Comment);Tape dressing --  Dressing Changed New --  Dressing Status Clean;Dry;Intact Clean;Dry;Intact  Dressing Change Frequency PRN PRN  Site / Wound Assessment Clean;Dry;Pink Clean;Dry  Peri-wound Assessment Intact --  Margins -- Unattached edges (unapproximated)  Closure None None  Drainage Amount None Minimal  Treatment Cleansed --     No Linked orders to display      Other problems: Hypertension/grade 1 diastolic dysfunction:  -Blood pressure stable on amlodipine  -Echocardiogram this admission with preserved LVEF with evidence of grade 1 diastolic dysfunction. 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   -Continue Keppra; levetiracetam level 35.9 on 11/12 -no further seizure activity since transitioned out of ICU setting therefore will discontinue  seizure precaution  HIV:  -CD4 count of 124.  -Dc'd Tivicay and Descovy infavor of Biktarvy to for eventual dc to SNF -Continue prophylactic Bactrim  Inguinal hernias -Evaluated by general surgery this admission. Documented as chronically  incarcerated. Patient has been clinically stable and tolerating tube feedings and having bowel movements so unless patient obstructed would not be considered a candidate for repair. Even if obstructed consulting surgeon was not sure he would be a candidate for hernia repair regardless .   Data Reviewed: Basic Metabolic Panel: Recent Labs  Lab 01/05/20 0757  NA 140  K 4.4  CL 104  CO2 24  GLUCOSE 122*  BUN 19  CREATININE 0.66  CALCIUM 10.2   Liver Function Tests: No results for input(s): AST, ALT, ALKPHOS, BILITOT, PROT, ALBUMIN in the last 168 hours. No results for input(s): LIPASE, AMYLASE in the last 168 hours. No results for input(s): AMMONIA in the last 168 hours. CBC: Recent Labs  Lab 01/05/20 0757 01/08/20 0844  WBC 3.0* 3.9*  NEUTROABS 1.7 2.5  HGB 15.7 14.6  HCT 48.7 45.1  MCV 95.3 94.9  PLT 164 193   Cardiac Enzymes: No results for input(s): CKTOTAL, CKMB, CKMBINDEX, TROPONINI in the last 168 hours. BNP (last 3 results) No results for input(s): BNP in the last 8760 hours.  ProBNP (last 3 results) No results for input(s): PROBNP in the last 8760 hours.  CBG: Recent Labs  Lab 01/09/20 1216 01/09/20 1719 01/10/20 0007 01/10/20 0712 01/10/20 1218  GLUCAP 174* 157* 124* 108* 182*    No results found for this or any previous visit (from the past 240 hour(s)).   Studies: No results found.  Scheduled Meds: . acetaminophen (TYLENOL) oral liquid 160 mg/5 mL  650 mg Per Tube Q6H  . amLODipine  10 mg Per Tube Daily  . baclofen  20 mg Per Tube TID  . chlorhexidine gluconate (MEDLINE KIT)  15 mL Mouth Rinse BID  . Darunavir-Cobicisctat-Emtricitabine-Tenofovir Alafenamide  1 tablet Oral Q breakfast  . diclofenac Sodium  4 g Topical QID  . famotidine  10.4 mg Per Tube BID  . feeding supplement (OSMOLITE 1.5 CAL)  237 mL Per Tube Q24H  . feeding supplement (OSMOLITE 1.5 CAL)  474 mL Per Tube TID  . feeding supplement (PROSource TF)  45 mL Per Tube Daily   . fiber  1 packet Per Tube BID  . folic acid  1 mg Per Tube Daily  . free water  200 mL Per Tube Q4H  . gabapentin  100 mg Per Tube Q8H  . heparin injection (subcutaneous)  5,000 Units Subcutaneous Q8H  . levETIRAcetam  1,500 mg Per Tube BID  . Muscle Rub   Topical QID  . nystatin   Topical TID  . QUEtiapine  12.5 mg Per Tube QHS  . scopolamine  1 patch Transdermal Q72H  . sulfamethoxazole-trimethoprim  1 tablet Per Tube Once per day on Mon Wed Fri  . 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[facility])   Dysphagia  Protein-calorie malnutrition ([place])   Seizures ([place])   Bilateral recurrent inguinal hernia   Consultants:  PCCM  Palliative medicine  General surgery  Neurology  Interventional radiology  Procedures:  EEG  Echocardiogram  Tracheostomy  Antibiotics: Anti-infectives (From admission, onward)   Start     Dose/Rate Route Frequency Ordered Stop   12/19/19 0930  Darunavir-Cobicisctat-Emtricitabine-Tenofovir Alafenamide (SYMTUZA) 800-150-200-10 MG TABS 1 tablet        1 tablet Oral Daily with breakfast 12/19/19 0840     12/13/19 1000  bictegravir-emtricitabine-tenofovir AF (BIKTARVY) 50-200-25 MG per tablet 1 tablet  Status:  Discontinued        1 tablet Oral Daily 12/12/19 1413 12/19/19 0840   11/17/19 1548  ceFAZolin (ANCEF) 2-4 GM/100ML-% IVPB       Note to Pharmacy: Domenick Bookbinder   : cabinet override      11/17/19 1548 11/18/19 0359   11/16/19 1515  ceFAZolin (ANCEF) IVPB 2g/100 mL premix        2 g 200 mL/hr over 30 Minutes Intravenous  To Radiology 11/16/19 1511 11/17/19 1625   11/15/19 0900  sulfamethoxazole-trimethoprim (BACTRIM DS) 800-160 MG per tablet 1 tablet        1 tablet Per Tube Once per day on Mon Wed Fri 11/13/19 1906     11/13/19 1615  dolutegravir (TIVICAY) tablet 50 mg  Status:  Discontinued        50 mg Oral Daily 11/13/19 1521 12/12/19 1413   11/13/19 1615  emtricitabine-tenofovir AF (DESCOVY) 200-25 MG per tablet 1 tablet  Status:  Discontinued        1 tablet Per Tube Daily 11/13/19 1521 12/12/19 1413   11/13/19 1530  sulfamethoxazole-trimethoprim (BACTRIM DS) 800-160 MG per tablet 1 tablet  Status:  Discontinued        1 tablet Oral Once per day on Mon Wed Fri 11/13/19 1441 11/13/19 1906   11/13/19 1500  bictegravir-emtricitabine-tenofovir AF (BIKTARVY) 50-200-25 MG per tablet 1 tablet  Status:  Discontinued        1 tablet Oral Daily 11/13/19 1403 11/13/19 1521   11/10/19 1000  erythromycin 250 mg in sodium chloride 0.9 % 100 mL IVPB        250 mg 100 mL/hr over 60 Minutes Intravenous Every 8 hours 11/10/19 0907 11/11/19 2000   11/07/19 1200  ceFEPIme (MAXIPIME) 2 g in sodium chloride 0.9 % 100 mL IVPB        2 g 200 mL/hr over 30 Minutes Intravenous Every 8 hours 11/07/19 1013 11/13/19 2127   11/03/19 1200  cefTRIAXone (ROCEPHIN) 2 g in sodium chloride 0.9 % 100 mL IVPB        2 g 200 mL/hr over 30 Minutes Intravenous Every 24 hours 11/03/19 0922 11/05/19 1427   11/02/19 0800  vancomycin (VANCOREADY) IVPB 750 mg/150 mL  Status:  Discontinued        750 mg 150 mL/hr over 60 Minutes Intravenous Every 12 hours 11/01/19 1935 11/02/19 1105   11/02/19 0600  piperacillin-tazobactam (ZOSYN) IVPB 3.375 g  Status:  Discontinued        3.375 g 12.5 mL/hr over 240 Minutes Intravenous Every 8 hours 11/02/19 0311 11/03/19 0920   11/01/19 2200  ceFEPIme (MAXIPIME) 2 g in sodium chloride 0.9 % 100 mL IVPB  Status:  Discontinued        2 g 200 mL/hr over 30 Minutes Intravenous Every 12 hours 11/01/19 2143 11/02/19  0311   11/01/19 1915  vancomycin (VANCOREADY) IVPB 1500 mg/300  mL        1,500 mg 150 mL/hr over 120 Minutes Intravenous  Once 11/01/19 1909 11/01/19 2147   11/01/19 1845  cefTRIAXone (ROCEPHIN) 1 g in sodium chloride 0.9 % 100 mL IVPB        1 g 200 mL/hr over 30 Minutes Intravenous  Once 11/01/19 1842 11/01/19 2051   11/01/19 1845  azithromycin (ZITHROMAX) 500 mg in sodium chloride 0.9 % 250 mL IVPB        500 mg 250 mL/hr over 60 Minutes Intravenous  Once 11/01/19 1842 11/01/19 2051       Time spent: 30 minutes    Erin Hearing ANP  [facility]  Pager [phone].  01/10/2020, 12:44 PM  LOS: 70 days

## 2020-01-10 NOTE — Progress Notes (Signed)
No charge Progress Note  Chart briefly reviewed.  Communicated with Ms. [name], NP.  I examined patient this morning along with his RN in the room.  Patient just had a soft brown/green BM and nursing staff were cleaning him up.  As per RN report, lately patient has been more vocal and makes sounds which are not intelligible.  Also per RN, patient on bolus PEG tube feedings.  Exam: Patient appeared comfortable and in no obvious distress.  Head trach collar with oxygen at 5 L/min. RS: Clear to auscultation.  No increased work of breathing. CVS: S1 and S2 heard, RRR.  No JVD or murmurs.  Not on telemetry.  No leg edema. Abdomen: Nondistended, soft and nontender.  Normal bowel sounds heard.  PEG tube site without acute findings. CNS: Alert and making sounds but does not appear to be oriented.  Please: Appears to have lower extremity contractures. Skin: Despite being bedbound, skin on bottom and back without acute findings.  Assessment and plan: As per Ms. [name] note from today, appreciated and agree.  Ryan Scott, MD, [place], [facility]. [facility]  To contact the attending provider between 7A-7P or the covering provider during after hours 7P-7A, please log into the web site [email or web address] and access using universal [place] password for that web site. If you do not have the password, please call the hospital operator.

## 2020-01-10 NOTE — Evaluation (Signed)
Occupational Therapy Evaluation Patient Details Name: Ryan Orozco MRN: [id number] DOB: 10-26-1965 Today's Date: 01/10/2020    History of Present Illness 54 year old male found down outside convenience store 9/27. Pt with PEA and hypoxic brain injury. Trach 10/13, PEG 10/15. PMHx: HIV AIDS and alcohol abuse   Clinical Impression   Pt admitted with above. He demonstrates the below listed deficits and will benefit from continued OT to maximize safety and independence with BADLs.  Pt presents to OT with generalized weakness, impaired cognition, decreased activity tolerance, and increased spasticity.  PROM of bil. UEs elbow distally WFL with tone inhibitory techniques and passive, prolonged stretching.  Rt LE appears contracted at hip and knee.  Did assess Rt LE for possibility of serial casting, however, his contracture is too severe to cast safely as he would be at high risk for developing a wound.  With the level of writhing and athetoid types of movement, he'd also be at high risk to injury of the other limbs if a cast were applied - he is likely best managed systemically.  I am unsure if he was able to follow commands with his extremities as he appears to attempt to initiate movement, but then spasticity overrides the movement making it difficult to determine if initial movement was volitional.  To transfer him OOB safely will require tilt in space wheelchair with chest straps and pelvic straps to maintain positioning to prevent him from falling or sliding out of the chair.  OT will follow for below listed goals.  Recommend SNF.        Follow Up Recommendations  SNF    Equipment Recommendations  None recommended by OT    Recommendations for Other Services       Precautions / Restrictions Precautions Precautions: Fall Precaution Comments: trach, PEG, anoxic, flexor tone R>L LE and will throw into extension tone on the left without warning EOB (be careful not to get too close to the  edge) Restrictions Weight Bearing Restrictions: No      Mobility Bed Mobility Overal bed mobility: Needs Assistance Bed Mobility: Rolling Rolling: Total assist         General bed mobility comments: assist with all movements     Transfers                 General transfer comment: unable to attempt safely.      Balance     Sitting balance-Leahy Scale: Zero                                     ADL either performed or assessed with clinical judgement   ADL                                         General ADL Comments: Pt requires total A for all aspects. Pt incontinent urine and large loose stool.  He was assited with peri care in supine      Vision   Additional Comments: Pt able to visually fixate and appears to track      Perception     Praxis Praxis Praxis tested?: Deficits Deficits: Initiation    Pertinent Vitals/Pain Pain Assessment: Faces Faces Pain Scale: Hurts little more Pain Location: pt moaning and restless with attempts at ROM, but difficult to accurately determine as pt unable to  communicate effectively  Pain Descriptors / Indicators: Moaning;Spasm;Restless Pain Intervention(s): Monitored during session     Hand Dominance  (unknown )   Extremity/Trunk Assessment Upper Extremity Assessment Upper Extremity Assessment: RUE deficits/detail;LUE deficits/detail RUE Deficits / Details: Pt with writhing/athetoid movements with moderate spasticity noted bil. UEs - flucutating flexor and extensor, but flexion worse.  with inhibitory techniques able to achieve full extension of elbow, wrist and hand with progressive passive stretch and shoulder flexion and abduction to 90*.  Pt does not use UEs purposefully RUE Sensation:  (unable to accurately assess ) RUE Coordination: decreased fine motor;decreased gross motor LUE Deficits / Details: LUE deficits/detail;LUE deficits/detail LUE Sensation:  (unable to accurately  assess ) LUE Coordination: decreased gross motor;decreased fine motor   Lower Extremity Assessment Lower Extremity Assessment: Defer to PT evaluation   Cervical / Trunk Assessment Cervical / Trunk Assessment: Other exceptions Cervical / Trunk Exceptions: pt pulls head/neck and upper trunk forward off the bed in response to spasticity.  writhing/athetoid movement noted    Communication Communication Communication: Tracheostomy;Expressive difficulties;Receptive difficulties   Cognition Arousal/Alertness: Awake/alert Behavior During Therapy: Restless Overall Cognitive Status: Impaired/Different from baseline                     Current Attention Level: Focused           General Comments: Pt will visually fixate on therapist and tracks.  Difficult to accurately tell if he is actually able to follow appendicular commands as he has continuously writhing movements and spasticity - while it appears he may be moving to command, it is difficult to determine if this may be caused by spasm and writhing /athetoid movement    General Comments  Pt on 21% Fi02 via trach collar     Exercises Exercises: Other exercises Other Exercises Other Exercises: spasticity inhibition techniques  Gently passive prolonged stretch to bil. UEs as well as bil. LEs    Shoulder Instructions      Home Living Family/patient expects to be discharged to:: Skilled nursing facility                                 Additional Comments: pt was independent, living with brother who has since passed during this admission      Prior Functioning/Environment Level of Independence: Independent        Comments: per chart review, pt was independent         OT Problem List: Decreased strength;Decreased range of motion;Decreased activity tolerance;Impaired balance (sitting and/or standing);Impaired vision/perception;Decreased coordination;Decreased cognition;Decreased safety awareness;Impaired  tone;Impaired UE functional use;Pain      OT Treatment/Interventions: Self-care/ADL training;Neuromuscular education;DME and/or AE instruction;Therapeutic activities;Splinting;Cognitive remediation/compensation;Visual/perceptual remediation/compensation;Patient/family education;Balance training    OT Goals(Current goals can be found in the care plan section) Acute Rehab OT Goals Patient Stated Goal: unable to state OT Goal Formulation: With patient Time For Goal Achievement: 01/24/20 Potential to Achieve Goals: Fair ADL Goals Additional ADL Goal #1: Pt will follow one step appendicular commands 3/5 trials Additional ADL Goal #2: Pt will wash face with maximal assist/hand over hand assist Additional ADL Goal #3: Family will be independent with PROM and positioning bil. UEs Additional ADL Goal #4: Pt will maintain EOB sitting with max A in prep for functional mobility  OT Frequency: Min 2X/week   Barriers to D/C: Decreased caregiver support          Co-evaluation  AM-PAC OT "6 Clicks" Daily Activity     Outcome Measure Help from another person eating meals?: Total Help from another person taking care of personal grooming?: Total Help from another person toileting, which includes using toliet, bedpan, or urinal?: Total Help from another person bathing (including washing, rinsing, drying)?: Total Help from another person to put on and taking off regular upper body clothing?: Total Help from another person to put on and taking off regular lower body clothing?: Total 6 Click Score: 6   End of Session Equipment Utilized During Treatment: Oxygen Nurse Communication: Mobility status  Activity Tolerance: Patient tolerated treatment well Patient left: in bed;with call bell/phone within reach;with bed alarm set;with nursing/sitter in room  OT Visit Diagnosis: Cognitive communication deficit (R41.841);Muscle weakness (generalized) (M62.81)                Time:  5170-0174 OT Time Calculation (min): 40 min Charges:  OT General Charges $OT Visit: 1 Visit OT Evaluation $OT Eval Moderate Complexity: 1 Mod OT Treatments $Neuromuscular Re-education: 23-37 mins  Eber Jones., OTR/L Acute Rehabilitation Services Pager [phone] Office [phone]   Jeani Hawking M 01/10/2020, 11:08 AM

## 2020-01-11 LAB — GLUCOSE, CAPILLARY
Glucose-Capillary: 107 mg/dL — ABNORMAL HIGH (ref 70–99)
Glucose-Capillary: 112 mg/dL — ABNORMAL HIGH (ref 70–99)
Glucose-Capillary: 162 mg/dL — ABNORMAL HIGH (ref 70–99)
Glucose-Capillary: 180 mg/dL — ABNORMAL HIGH (ref 70–99)
Glucose-Capillary: 89 mg/dL (ref 70–99)
Glucose-Capillary: 158 mg/dL — ABNORMAL HIGH (ref 70–99)

## 2020-01-11 MED ORDER — BACLOFEN 10 MG PO TABS
30.0000 mg | ORAL_TABLET | Freq: Three times a day (TID) | ORAL | Status: DC
  Administered 2020-01-11 – 2020-04-08 (×265): 30 mg
  Filled 2020-01-11 (×264): qty 3

## 2020-01-11 MED ORDER — GABAPENTIN 250 MG/5ML PO SOLN
200.0000 mg | Freq: Three times a day (TID) | ORAL | Status: DC
  Administered 2020-01-11 – 2020-04-08 (×259): 200 mg
  Filled 2020-01-11 (×279): qty 4

## 2020-01-11 MED ORDER — OXYCODONE HCL 5 MG/5ML PO SOLN: 5.0000 mg | Freq: Two times a day (BID) | ORAL | Status: DC

## 2020-01-11 MED ORDER — OXYCODONE HCL 5 MG/5ML PO SOLN
5.0000 mg | Freq: Two times a day (BID) | ORAL | Status: DC
  Administered 2020-01-11 – 2020-01-12 (×3): 5 mg
  Filled 2020-01-11 (×3): qty 5

## 2020-01-11 MED ORDER — IBUPROFEN 100 MG/5ML PO SUSP
600.0000 mg | Freq: Four times a day (QID) | ORAL | Status: DC
  Administered 2020-01-11 – 2020-01-12 (×4): 600 mg
  Filled 2020-01-11 (×5): qty 30

## 2020-01-11 NOTE — Progress Notes (Signed)
[facility] PROGRESS NOTE  Nashid [place] WUJ:[id number] DOB: 1965-07-25 DOA: 11/01/2019 PCP: Default, Provider, MD    11/16   Status: Remains inpatient appropriate because:Altered mental status, Unsafe d/c plan and Inpatient level of care appropriate due to severity of illness. Patient newly diagnosed with anoxic brain injury  Dispo: The patient is from: Home              Anticipated d/c is to: SNF              Anticipated d/c date is: > 3 days              Patient currently is medically stable to d/c.  PMV training in process. Medicaid application in process. Patient will also need to have disability paperwork completed since do not expect full recovery.  Trach Tube has been in place greater than 30 days.   Code Status: Full Family Communication: 12/06 dtr [name] by telephone DVT prophylaxis: Subcutaneous heparin Vaccination status: Does not appear to have received Covid vaccine but will need to confirm with family  Foley catheter: No  HPI: 54 year old male with HIV, chronic alcohol abuse who was presented to the emergency department after being found unresponsive outside a convinient store.  EMS found him pulseless in PEA, unknown downtime, successfully resuscitated and brought to the emergency department.  UDS positive for THC/benzodiazepines.  CT head unremarkable.  Failed extubation trials due to severe anoxic brain injury and had tracheostomy placed.  PEG placed on 10/15.    Significant Hospital Events   9/29 Admit post PEA arrest. UDS positive for THC, benzo's. ETOH 177 10/05 EEG ongoing. Versed restarted overnight due to agitation. Nicardipine increased.  10/06 Developed tachypnea with WUA, no follow commands off sedation  10/07 Vomiting, TF held / restarted with recurrent vomiting 10/16 tracheostomy collar for 9 hours 10/18->10/19 off vent all night  10/20> TC 11/15 > downsized to #4 Shiley flex CFS  Subjective: Awakened.  Appears to be uncomfortable and  repeatedly stating "ow"  Objective: Vitals:   01/11/20 0824 01/11/20 1211  BP:    Pulse: 93 89  Resp: 20 18  Temp:    SpO2: 96% 96%    Intake/Output Summary (Last 24 hours) at 01/11/2020 1240 Last data filed at 01/11/2020 0447 Gross per 24 hour  Intake 837 ml  Output 550 ml  Net 287 ml   Filed Weights   01/09/20 0106 01/10/20 2105 01/11/20 0224  Weight: 72.7 kg 72.3 kg 72.3 kg    Exam: Constitutional: NAD, sleeping but appeared to be uncomfortable once awakened Respiratory: Coarse to auscultation bilaterally, #4 cuffless trach with 21% FiO2/TC and was changed out by RT on 12/6; no significant tracheal secretions Cardiovascular: Regular rate and rhythm, skin warm and dry Abdomen: non tender, bowel sounds positive. PEG tube with tube feeding Musculoskeletal: Exam deferred but at baseline moderate upper extremity dystonic movements with activity.  Decrease in rhythmic head-bobbing when awake/active not present while sleeping.  Continues with significant hypertonicity worse in lower extremities and keeps legs flexed at hips with providers able to extend the legs.  Also appears to have hypertonicity in upper extremities as well Skin: Several decubiti as described below Neurologic: CN 2-12 grossly intact; Sensation intact both upper and lower extremities, difficult to test strength in upper extremities but appears to be 2/5 with lower extremities 2-3/5.   Psychiatric: Very sleepy.  Does respond to name.  Difficult to assess secondary to expressive aphasia from brain injury   Assessment/Plan: Acute problems: Anoxic  brain injury 2/2 PEA arrest/pain syndrome secondary to hypertonicity:  -Presumed 2/2 combination alcohol and BZD overdose with UDS positive for THC and BZDs -Do not expect he will recover fully from this event therefore long-term SNF placement recommended -Continue PT/OT/SLP -Baclofen 20 mg TID resumed on the afternoon of 12/7.  No significant improvement in hypertonicity  so will increase to 30 mg -Continue low-dose gabapentin to help with pain from hypertonicity (added on 12/8) -Add scheduled ibuprofen 600 mg PT q 8 hours -Due to severity of pain and difficulty sleeping will begin low-dose oxycodone 5 mg per tube every 12 hours -12/2 Seroquel decreased to 12.5 mg in context of rhythmic head-bobbing i.e. concerns over possible EPS symptoms.  Head-bobbing not present while sleeping -Continue scheduled Tylenol for any pain associated with dystonic movements -Voltaren gel ineffective therefore will transition to CREA muscle rub  Acute hypoxemic respiratory failure/new tracheostomy tube requirement -Hypoxemia now resolved -Continue PMV training per SLP -#4 cuffless trach-capping trial at discretion of trach team -Tracheostomy tube placed on 10/3  -11/29 was evaluated by trach team; because of persistent encephalopathy and mild to modest secretions currently not a candidate for decannulation at this time.  -Continue supervised PMV trials  -Continue pulmonary hygiene with Vibra vest therapy  Abnormal appearing urine -UA consistent with dehydration/hyperconcentration for free water increased per tube -CBC unremarkable except for slightly low WBC in context of known HIV disease  Dysphagia 2/2 anoxic brain injury -Continue tube feeding per PEG -Repeat SLP evaluation on 11/23 with recommendation for n.p.o. status  Goals of care:  -Palliative care was involved during this hospitalization.   -Goals of care were discussed. remains full code.  - Palliative care recommended outpatient follow-up with palliative providers. -May need to revisit end-of-life goals of care since patient making poor progress and currently having significant pain related to hypertonicity from brain injury  Protein calorie malnutrition nutrition Status: Nutrition Problem: Increased nutrient needs Etiology: acute illness Signs/Symptoms: estimated needs Interventions: Tube feeding consisting  of Osmolite 1.5 at 60 cc/h, Juven twice daily, Prosource TF 45 mL 3 times daily Estimated body mass index is 25.71 kg/m as calculated from the following:   Height as of this encounter: _0  (1.676 m).   Weight as of this encounter: 72.3 kg.   Multiple decubiti not POA Wound / Incision (Open or Dehisced) 11/17/19 Puncture Abdomen Left;Anterior;Upper G-tube insertion site  (Active)  Date First Assessed/Time First Assessed: 11/17/19 1646   Wound Type: Puncture  Location: Abdomen  Location Orientation: Left;Anterior;Upper  Wound Description (Comments): G-tube insertion site   Present on Admission: No    Assessments 11/17/2019  4:47 PM 01/04/2020  7:49 PM  Dressing Type Gauze (Comment);Tape dressing --  Dressing Changed New --  Dressing Status Clean;Dry;Intact Clean;Dry;Intact  Dressing Change Frequency PRN PRN  Site / Wound Assessment Clean;Dry;Pink Clean;Dry  Peri-wound Assessment Intact --  Margins -- Unattached edges (unapproximated)  Closure None None  Drainage Amount None Minimal  Treatment Cleansed --     No Linked orders to display      Other problems: Hypertension/grade 1 diastolic dysfunction:  -Blood pressure stable on amlodipine  -Echocardiogram this admission with preserved LVEF with evidence of grade 1 diastolic dysfunction. 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   -Continue Keppra; levetiracetam level 35.9 on 11/12 -no further seizure activity since transitioned out of ICU setting therefore will discontinue seizure precaution  HIV:  -CD4 count of 124.  -Dc'd Tivicay and  Descovy infavor of Biktarvy to for eventual dc to SNF -Continue prophylactic Bactrim  Inguinal hernias -Evaluated by general surgery this admission. Documented as chronically incarcerated. Patient has been clinically stable and tolerating tube feedings and having bowel movements so unless patient obstructed would not be  considered a candidate for repair. Even if obstructed consulting surgeon was not sure he would be a candidate for hernia repair regardless .   Data Reviewed: Basic Metabolic Panel: Recent Labs  Lab 01/05/20 0757  NA 140  K 4.4  CL 104  CO2 24  GLUCOSE 122*  BUN 19  CREATININE 0.66  CALCIUM 10.2   Liver Function Tests: No results for input(s): AST, ALT, ALKPHOS, BILITOT, PROT, ALBUMIN in the last 168 hours. No results for input(s): LIPASE, AMYLASE in the last 168 hours. No results for input(s): AMMONIA in the last 168 hours. CBC: Recent Labs  Lab 01/05/20 0757 01/08/20 0844  WBC 3.0* 3.9*  NEUTROABS 1.7 2.5  HGB 15.7 14.6  HCT 48.7 45.1  MCV 95.3 94.9  PLT 164 193   Cardiac Enzymes: No results for input(s): CKTOTAL, CKMB, CKMBINDEX, TROPONINI in the last 168 hours. BNP (last 3 results) No results for input(s): BNP in the last 8760 hours.  ProBNP (last 3 results) No results for input(s): PROBNP in the last 8760 hours.  CBG: Recent Labs  Lab 01/10/20 1616 01/10/20 1943 01/10/20 2302 01/11/20 0324 01/11/20 0739  GLUCAP 120* 177* 82 107* 112*    No results found for this or any previous visit (from the past 240 hour(s)).   Studies: No results found.  Scheduled Meds: . acetaminophen (TYLENOL) oral liquid 160 mg/5 mL  650 mg Per Tube Q6H  . amLODipine  10 mg Per Tube Daily  . baclofen  30 mg Per Tube TID  . chlorhexidine gluconate (MEDLINE KIT)  15 mL Mouth Rinse BID  . Darunavir-Cobicisctat-Emtricitabine-Tenofovir Alafenamide  1 tablet Oral Q breakfast  . famotidine  10.4 mg Per Tube BID  . feeding supplement (OSMOLITE 1.5 CAL)  237 mL Per Tube Q24H  . feeding supplement (OSMOLITE 1.5 CAL)  474 mL Per Tube TID  . feeding supplement (PROSource TF)  45 mL Per Tube Daily  . fiber  1 packet Per Tube BID  . folic acid  1 mg Per Tube Daily  . free water  200 mL Per Tube Q4H  . gabapentin  200 mg Per Tube Q8H  . heparin injection (subcutaneous)  5,000 Units  Subcutaneous Q8H  . ibuprofen  600 mg Per Tube Q6H  . levETIRAcetam  1,500 mg Per Tube BID  . Muscle Rub   Topical QID  . nystatin   Topical TID  . oxyCODONE  5 mg Per Tube Q12H  . QUEtiapine  12.5 mg Per Tube QHS  . scopolamine  1 patch Transdermal Q72H  . sulfamethoxazole-trimethoprim  1 tablet Per Tube Once per day on Mon Wed Fri  . thiamine  100 mg Per Tube Daily   Continuous Infusions:   Active Problems:   Cardiac arrest [facility])   Community acquired pneumonia of left upper lobe of lung   Anoxic brain injury ([place])   Alcohol abuse   Acute respiratory failure with hypoxia ([facility])   Vomiting   Pressure injury of skin   Small bowel obstruction ([facility])   Palliative care encounter   Bowel obstruction ([facility])   Chronic respiratory failure ([facility])   Diastolic dysfunction   Tracheostomy dependent ([place])   Dysphagia   Protein-calorie malnutrition ([place])  Seizures ([place])   Bilateral recurrent inguinal hernia   Muscle spasticity   Consultants:  PCCM  Palliative medicine  General surgery  Neurology  Interventional radiology  Procedures:  EEG  Echocardiogram  Tracheostomy  Antibiotics: Anti-infectives (From admission, onward)   Start     Dose/Rate Route Frequency Ordered Stop   12/19/19 0930  Darunavir-Cobicisctat-Emtricitabine-Tenofovir Alafenamide (SYMTUZA) 800-150-200-10 MG TABS 1 tablet        1 tablet Oral Daily with breakfast 12/19/19 0840     12/13/19 1000  bictegravir-emtricitabine-tenofovir AF (BIKTARVY) 50-200-25 MG per tablet 1 tablet  Status:  Discontinued        1 tablet Oral Daily 12/12/19 1413 12/19/19 0840   11/17/19 1548  ceFAZolin (ANCEF) 2-4 GM/100ML-% IVPB       Note to Pharmacy: Domenick Bookbinder   : cabinet override      11/17/19 1548 11/18/19 0359   11/16/19 1515  ceFAZolin (ANCEF) IVPB 2g/100 mL premix        2 g 200 mL/hr over 30 Minutes Intravenous To Radiology 11/16/19 1511 11/17/19 1625   11/15/19 0900  sulfamethoxazole-trimethoprim (BACTRIM DS)  800-160 MG per tablet 1 tablet        1 tablet Per Tube Once per day on Mon Wed Fri 11/13/19 1906     11/13/19 1615  dolutegravir (TIVICAY) tablet 50 mg  Status:  Discontinued        50 mg Oral Daily 11/13/19 1521 12/12/19 1413   11/13/19 1615  emtricitabine-tenofovir AF (DESCOVY) 200-25 MG per tablet 1 tablet  Status:  Discontinued        1 tablet Per Tube Daily 11/13/19 1521 12/12/19 1413   11/13/19 1530  sulfamethoxazole-trimethoprim (BACTRIM DS) 800-160 MG per tablet 1 tablet  Status:  Discontinued        1 tablet Oral Once per day on Mon Wed Fri 11/13/19 1441 11/13/19 1906   11/13/19 1500  bictegravir-emtricitabine-tenofovir AF (BIKTARVY) 50-200-25 MG per tablet 1 tablet  Status:  Discontinued        1 tablet Oral Daily 11/13/19 1403 11/13/19 1521   11/10/19 1000  erythromycin 250 mg in sodium chloride 0.9 % 100 mL IVPB        250 mg 100 mL/hr over 60 Minutes Intravenous Every 8 hours 11/10/19 0907 11/11/19 2000   11/07/19 1200  ceFEPIme (MAXIPIME) 2 g in sodium chloride 0.9 % 100 mL IVPB        2 g 200 mL/hr over 30 Minutes Intravenous Every 8 hours 11/07/19 1013 11/13/19 2127   11/03/19 1200  cefTRIAXone (ROCEPHIN) 2 g in sodium chloride 0.9 % 100 mL IVPB        2 g 200 mL/hr over 30 Minutes Intravenous Every 24 hours 11/03/19 0922 11/05/19 1427   11/02/19 0800  vancomycin (VANCOREADY) IVPB 750 mg/150 mL  Status:  Discontinued        750 mg 150 mL/hr over 60 Minutes Intravenous Every 12 hours 11/01/19 1935 11/02/19 1105   11/02/19 0600  piperacillin-tazobactam (ZOSYN) IVPB 3.375 g  Status:  Discontinued        3.375 g 12.5 mL/hr over 240 Minutes Intravenous Every 8 hours 11/02/19 0311 11/03/19 0920   11/01/19 2200  ceFEPIme (MAXIPIME) 2 g in sodium chloride 0.9 % 100 mL IVPB  Status:  Discontinued        2 g 200 mL/hr over 30 Minutes Intravenous Every 12 hours 11/01/19 2143 11/02/19 0311   11/01/19 1915  vancomycin (VANCOREADY) IVPB 1500 mg/300 mL  1,500 mg 150 mL/hr over  120 Minutes Intravenous  Once 11/01/19 1909 11/01/19 2147   11/01/19 1845  cefTRIAXone (ROCEPHIN) 1 g in sodium chloride 0.9 % 100 mL IVPB        1 g 200 mL/hr over 30 Minutes Intravenous  Once 11/01/19 1842 11/01/19 2051   11/01/19 1845  azithromycin (ZITHROMAX) 500 mg in sodium chloride 0.9 % 250 mL IVPB        500 mg 250 mL/hr over 60 Minutes Intravenous  Once 11/01/19 1842 11/01/19 2051       Time spent: 30 minutes    Erin Hearing ANP  [facility]  Pager [phone].  01/11/2020, 12:40 PM  LOS: 71 days

## 2020-01-11 NOTE — Plan of Care (Signed)
  Problem: Education: Goal: Knowledge of General Education information will improve Description: Including pain rating scale, medication(s)/side effects and non-pharmacologic comfort measures Outcome: Progressing   Problem: Health Behavior/Discharge Planning: Goal: Ability to manage health-related needs will improve Outcome: Progressing   Problem: Clinical Measurements: Goal: Ability to maintain clinical measurements within normal limits will improve Outcome: Progressing Goal: Will remain free from infection Outcome: Progressing Goal: Diagnostic test results will improve Outcome: Progressing Goal: Respiratory complications will improve Outcome: Progressing Goal: Cardiovascular complication will be avoided Outcome: Progressing   Problem: Activity: Goal: Risk for activity intolerance will decrease Outcome: Progressing   Problem: Nutrition: Goal: Adequate nutrition will be maintained Outcome: Progressing   Problem: Coping: Goal: Level of anxiety will decrease Outcome: Progressing   Problem: Elimination: Goal: Will not experience complications related to bowel motility Outcome: Progressing Goal: Will not experience complications related to urinary retention Outcome: Progressing   Problem: Pain Managment: Goal: General experience of comfort will improve Outcome: Progressing   Problem: Safety: Goal: Ability to remain free from injury will improve Outcome: Progressing   Problem: Skin Integrity: Goal: Risk for impaired skin integrity will decrease Outcome: Progressing   Problem: Education: Goal: 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 Outcome: Progressing   Problem: Activity: Goal: Ability to tolerate increased activity will improve Outcome: Progressing   Problem: Health Behavior/Discharge Planning: Goal: 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 Outcome: Progressing   Problem: Respiratory: Goal: Patent airway maintenance will  improve Outcome: Progressing   Problem: Role Relationship: Goal: Ability to communicate will improve Outcome: Progressing   Problem: Education: Goal: Knowledge of the prescribed therapeutic regimen Outcome: Progressing Goal: Knowledge of disease or condition will improve Outcome: Progressing   Problem: Clinical Measurements: Goal: Neurologic status will improve Outcome: Progressing   Problem: Tissue Perfusion: Goal: Ability to maintain intracranial pressure will improve Outcome: Progressing   Problem: Respiratory: Goal: Will regain and/or maintain adequate ventilation Outcome: Progressing   Problem: Skin Integrity: Goal: Risk for impaired skin integrity will decrease Outcome: Progressing Goal: Demonstration of wound healing without infection will improve Outcome: Progressing   Problem: Psychosocial: Goal: Ability to verbalize positive feelings about self will improve Outcome: Progressing Goal: Ability to participate in self-care as condition permits will improve Outcome: Progressing Goal: Ability to identify appropriate support needs will improve Outcome: Progressing   Problem: Health Behavior/Discharge Planning: Goal: Ability to manage health-related needs will improve Outcome: Progressing   Problem: Nutritional: Goal: Risk of aspiration will decrease Outcome: Progressing Goal: Dietary intake will improve Outcome: Progressing   Problem: Communication: Goal: Ability to communicate needs accurately will improve Outcome: Progressing   

## 2020-01-12 LAB — GLUCOSE, CAPILLARY
Glucose-Capillary: 83 mg/dL (ref 70–99)
Glucose-Capillary: 118 mg/dL — ABNORMAL HIGH (ref 70–99)
Glucose-Capillary: 109 mg/dL — ABNORMAL HIGH (ref 70–99)
Glucose-Capillary: 97 mg/dL (ref 70–99)
Glucose-Capillary: 100 mg/dL — ABNORMAL HIGH (ref 70–99)

## 2020-01-12 MED ORDER — TIZANIDINE HCL 2 MG PO TABS
2.0000 mg | ORAL_TABLET | Freq: Three times a day (TID) | ORAL | Status: DC
  Administered 2020-01-14 – 2020-01-16 (×5): 2 mg
  Filled 2020-01-12 (×7): qty 1

## 2020-01-12 MED ORDER — IBUPROFEN 100 MG/5ML PO SUSP
600.0000 mg | Freq: Four times a day (QID) | ORAL | Status: AC
  Administered 2020-01-12 – 2020-01-18 (×24): 600 mg
  Filled 2020-01-12 (×25): qty 30

## 2020-01-12 MED ORDER — OXYCODONE HCL 5 MG/5ML PO SOLN
5.0000 mg | Freq: Three times a day (TID) | ORAL | Status: DC
  Administered 2020-01-12 – 2020-01-16 (×12): 5 mg
  Filled 2020-01-12 (×12): qty 5

## 2020-01-12 MED ORDER — TIZANIDINE HCL 2 MG PO TABS
2.0000 mg | ORAL_TABLET | Freq: Every day | ORAL | Status: AC
  Administered 2020-01-12 – 2020-01-13 (×2): 2 mg
  Filled 2020-01-12 (×2): qty 1

## 2020-01-12 NOTE — Plan of Care (Signed)

## 2020-01-12 NOTE — Progress Notes (Signed)
Occupational Therapy Treatment Patient Details Name: Ryan Orozco MRN: [id number] DOB: Sep 13, 1965 Today's Date: 01/12/2020    History of present illness 54 year old male found down outside convenience store 9/27. Pt with PEA and hypoxic brain injury. Trach 10/13, PEG 10/15. PMHx: HIV AIDS and alcohol abuse   OT comments  Pt with significantly less spasticity today!.  He was sitting up in bed positioned beautifully.  He was very engaged, laughing and smiling throughout session.  He was able to say "hi" and "yes".  He followed one step commands consistently with a delay and was able to initiate small excursions of active elbow extension and shoulder flexion during facilitation of reach with bil. UEs and was able to initiate small excursions of hip and knee extension bil.  He does seem to be apraxic.   Agree with PTs note. Give the significant improvement in spasticity, he may be able to safely be transferred to a chair with direct supervision and positioning devices/strapping.  Will continue to follow.   Follow Up Recommendations  SNF    Equipment Recommendations  None recommended by OT    Recommendations for Other Services      Precautions / Restrictions Precautions Precautions: Fall Precaution Comments: trach, PEG, anoxic, flexor tone R>L LE       Mobility Bed Mobility                  Transfers                      Balance                                           ADL either performed or assessed with clinical judgement   ADL Overall ADL's : Needs assistance/impaired     Grooming: Wash/dry face;Bed level;Total assistance Grooming Details (indicate cue type and reason): hand over hand assist provided                                     Vision   Additional Comments: pt visually tracking consistently   Perception     Praxis      Cognition Arousal/Alertness: Awake/alert Behavior During Therapy: WFL for tasks  assessed/performed Overall Cognitive Status: Impaired/Different from baseline Area of Impairment: Attention;Following commands                   Current Attention Level: Sustained   Following Commands: Follows one step commands inconsistently;Follows one step commands with increased time     Problem Solving: Slow processing;Decreased initiation;Difficulty sequencing;Requires verbal cues;Requires tactile cues General Comments: Pt able to consistently follow commands with a delay.  He responded "Hi"  when I said "hey Caylor" and was able to say "yes" in response to a question        Exercises Other Exercises Other Exercises: Worked on facilitation of reach with Lt UE and Rt UE Other Exercises: faciliation of active hip and knee extension bil. LEs - able to initiate small excursions   Shoulder Instructions       General Comments      Pertinent Vitals/ Pain       Pain Assessment: Faces Faces Pain Scale: Hurts little more Pain Location: with PROM of Rt LE Pain Descriptors / Indicators: Grimacing;Moaning Pain Intervention(s): Monitored during session  Home  Living                                          Prior Functioning/Environment              Frequency  Min 2X/week        Progress Toward Goals  OT Goals(current goals can now be found in the care plan section)  Progress towards OT goals: Progressing toward goals     Plan Discharge plan remains appropriate    Co-evaluation                 AM-PAC OT "6 Clicks" Daily Activity     Outcome Measure   Help from another person eating meals?: Total Help from another person taking care of personal grooming?: Total Help from another person toileting, which includes using toliet, bedpan, or urinal?: Total Help from another person bathing (including washing, rinsing, drying)?: Total Help from another person to put on and taking off regular upper body clothing?: Total Help from another  person to put on and taking off regular lower body clothing?: Total 6 Click Score: 6    End of Session Equipment Utilized During Treatment: Oxygen  OT Visit Diagnosis: Cognitive communication deficit (R41.841);Muscle weakness (generalized) (M62.81)   Activity Tolerance Patient tolerated treatment well   Patient Left in bed;with call bell/phone within reach   Nurse Communication Mobility status        Time: 4431-5400 OT Time Calculation (min): 30 min  Charges: OT General Charges $OT Visit: 1 Visit OT Treatments $Neuromuscular Re-education: 23-37 mins  Eber Jones OTR/L Acute Rehabilitation Services Pager [phone] Office [phone]    Jeani Hawking M 01/12/2020, 6:49 PM

## 2020-01-12 NOTE — Progress Notes (Signed)
Physical Therapy Treatment Patient Details Name: Ryan Orozco MRN: [id number] DOB: July 23, 1965 Today's Date: 01/12/2020    History of Present Illness 54 year old male found down outside convenience store 9/27. Pt with PEA and hypoxic brain injury. Trach 10/13, PEG 10/15. PMHx: HIV AIDS and alcohol abuse    PT Comments    Pt tolerated tx better today with less pain, seemingly less UE and trunkal tone in sitting EOB.  His R LE, however, remains the same in rigid knee and hip flexion.  He did seem to enjoy "dancing" EOB (see details below) for >20 mins.  I did not use PMV as what vocalization he does seem to be doing he is doing around the trach.  PT will continue to follow acutely for safe mobility progression.  I would like to try OOB to chair with wheels next session.  Two people will be needed to make this safe and to monitor while wheeling around the hallway. May try to coordinate next session with OT.    Follow Up Recommendations  SNF     Equipment Recommendations  Wheelchair (measurements PT);Wheelchair cushion (measurements PT);Hospital bed;Other (comment) (hoyer lift)    Recommendations for Other Services       Precautions / Restrictions Precautions Precautions: Fall Precaution Comments: trach, PEG, anoxic, flexor tone R>L LE Restrictions Weight Bearing Restrictions: No    Mobility  Bed Mobility Overal bed mobility: Needs Assistance Bed Mobility: Supine to Sit;Sit to Supine     Supine to sit: Total assist Sit to supine: Total assist   General bed mobility comments: Used flexor tone to help bring him to EOB today.  Trunkal tone and UE tone seem better as MDs and NP is adjusting his baclofen.  R LE remains the same.  Transfers                 General transfer comment: I would acutally like to try soon to get him to a WC or recliner chair, but will have to be seat belted in for safety and closely monitored. Would have to go back to bed (not safe to be left in  the chair, even with a safety belt in place).  Ambulation/Gait                 Stairs             Wheelchair Mobility    Modified Rankin (Stroke Patients Only)       Balance Overall balance assessment: Needs assistance Sitting-balance support: Feet supported (left foot supported, R LE flexed) Sitting balance-Leahy Scale: Zero Sitting balance - Comments: total assist EOB, but less extension tone this session and less resistance to purturbations.  Sat EOB >20 mins incorporating music and dance-like motions at trunk, head and bil UEs to encrouage rotation, all direction perturbations, cervical extension and trunk extension.  He would relax a bit at R hip (not quite to 90/90 hip/knee), but then flex up again with perturbations of trunk.                                    Cognition Arousal/Alertness: Awake/alert Behavior During Therapy: Restless Overall Cognitive Status: Impaired/Different from baseline Area of Impairment: Orientation;Attention;Memory;Following commands;Safety/judgement;Awareness;Problem solving                 Orientation Level: Disoriented to;Place;Time;Situation Current Attention Level: Focused   Following Commands: Follows one step commands inconsistently;Follows one step commands with increased  time (difficult to assess due to physical inability as well) Safety/Judgement: Decreased awareness of safety;Decreased awareness of deficits Awareness: Intellectual Problem Solving: Slow processing;Decreased initiation General Comments: Pt appears to make eye contact, at times tracking therapist or objects.  Followed commands with a bit delay in processing to "look up at your RN (who was sitting in the hallway)" when EOB.  Seemed to bob head to music, but again, difficult to assess due to tone.      Exercises      General Comments General comments (skin integrity, edema, etc.): Pt tol EOB well, less signs of pain today. RN reports he  is on oxy and I saw NP is adjusting his baclofen.      Pertinent Vitals/Pain Pain Assessment: Faces Faces Pain Scale: Hurts little more Pain Location: generalized, but improved over last session.    Home Living                      Prior Function            PT Goals (current goals can now be found in the care plan section) Acute Rehab PT Goals Patient Stated Goal: unable to state Progress towards PT goals: Progressing toward goals    Frequency    Min 2X/week      PT Plan Current plan remains appropriate    Co-evaluation              AM-PAC PT "6 Clicks" Mobility   Outcome Measure  Help needed turning from your back to your side while in a flat bed without using bedrails?: Total Help needed moving from lying on your back to sitting on the side of a flat bed without using bedrails?: Total Help needed moving to and from a bed to a chair (including a wheelchair)?: Total Help needed standing up from a chair using your arms (e.g., wheelchair or bedside chair)?: Total Help needed to walk in hospital room?: Total Help needed climbing 3-5 steps with a railing? : Total 6 Click Score: 6    End of Session Equipment Utilized During Treatment: Oxygen (28% TC 5L, VSS throughout, HR reading erroneous per RN) Activity Tolerance: Patient tolerated treatment well Patient left: in bed;with call bell/phone within reach;with bed alarm set;Other (comment) (in high chair mode) Nurse Communication: Mobility status PT Visit Diagnosis: Other abnormalities of gait and mobility (R26.89);Muscle weakness (generalized) (M62.81)     Time: 3785-8850 PT Time Calculation (min) (ACUTE ONLY): 33 min  Charges:  $Therapeutic Activity: 8-22 mins $Neuromuscular Re-education: 8-22 mins                     Corinna Capra, PT, DPT  Acute Rehabilitation [phone] pager [phone]) [phone] office

## 2020-01-12 NOTE — Progress Notes (Addendum)
[facility] PROGRESS NOTE  Ryan Orozco ZSW:[id number] DOB: Mar 26, 1965 DOA: 11/01/2019 PCP: Default, Provider, MD    11/16   Status: Remains inpatient appropriate because:Altered mental status, Unsafe d/c plan and Inpatient level of care appropriate due to severity of illness. Patient newly diagnosed with anoxic brain injury  Dispo: The patient is from: Home              Anticipated d/c is to: SNF              Anticipated d/c date is: > 3 days              Patient currently is medically stable to d/c.  PMV training in process. Medicaid application in process. Patient will also need to have disability paperwork completed since do not expect full recovery.  Trach Tube has been in place greater than 30 days.   Code Status: Full Family Communication: 12/06 dtr [name] by telephone DVT prophylaxis: Subcutaneous heparin Vaccination status: Does not appear to have received Covid vaccine but will need to confirm with family  Foley catheter: No  HPI: 54 year old male with HIV, chronic alcohol abuse who was presented to the emergency department after being found unresponsive outside a convinient store.  EMS found him pulseless in PEA, unknown downtime, successfully resuscitated and brought to the emergency department.  UDS positive for THC/benzodiazepines.  CT head unremarkable.  Failed extubation trials due to severe anoxic brain injury and had tracheostomy placed.  PEG placed on 10/15.    Significant Hospital Events   9/29 Admit post PEA arrest. UDS positive for THC, benzo's. ETOH 177 10/05 EEG ongoing. Versed restarted overnight due to agitation. Nicardipine increased.  10/06 Developed tachypnea with WUA, no follow commands off sedation  10/07 Vomiting, TF held / restarted with recurrent vomiting 10/16 tracheostomy collar for 9 hours 10/18->10/19 off vent all night  10/20> TC 11/15 > downsized to #4 Shiley flex CFS  Subjective: Awakened.  Seems more comfortable today.   Noted more spontaneous upper extremity movement which is consistent with previous baseline.  Still is reporting discomfort with any palpation over right lateral hip or any attempt to straighten right leg.  Objective: Vitals:   01/12/20 0545 01/12/20 0552  BP: 102/71   Pulse: 86   Resp: 18   Temp: 98 F (36.7 C)   SpO2: 97% 98%    Intake/Output Summary (Last 24 hours) at 01/12/2020 1123 Last data filed at 01/12/2020 1100 Gross per 24 hour  Intake --  Output 1401 ml  Net -1401 ml   Filed Weights   01/09/20 0106 01/10/20 2105 01/11/20 0224  Weight: 72.7 kg 72.3 kg 72.3 kg    Exam: Constitutional: NAD, calm today and appears more comfortable Respiratory: Coarse to auscultation bilaterally, #4 cuffless trach with 21% FiO2/TC and was changed out by RT on 12/6; no significant tracheal secretions Cardiovascular: Regular rate and rhythm, skin warm and dry Abdomen: non tender, bowel sounds positive. PEG tube with tube feeding Musculoskeletal: moderate upper extremity dystonic movements with activity.  Decrease in rhythmic head-bobbing when awake/active not present while sleeping.  Continues with significant hypertonicity worse in lower extremities and keeps legs flexed at hips with providers able to extend the legs.  Also appears to have hypertonicity in upper extremities as well Skin: Several decubiti as described below Neurologic: CN 2-12 grossly intact; Sensation intact both upper and lower extremities, difficult to test strength in upper extremities but appears to be 2/5 with lower extremities 2-3/5.   Psychiatric: Very  sleepy.  Does respond to name.  Difficult to assess secondary to expressive aphasia from brain injury   Assessment/Plan: Acute problems: Anoxic brain injury 2/2 PEA arrest/pain syndrome secondary to hypertonicity:  -Presumed 2/2 combination alcohol and BZD overdose with UDS positive for THC and BZDs -Do not expect he will recover fully from this event therefore  long-term SNF placement recommended -Continue PT/OT/SLP -Baclofen 20 mg TID resumed on the afternoon of 12/7.  No significant improvement in hypertonicity so will increase to 30 mg -Continue low-dose gabapentin to help with pain from hypertonicity (added on 12/8) -Add scheduled ibuprofen 600 mg PT q 8 hours for a total of 7 days only with an automatic stop date.  Will check bmet in a.m. -Increase low-dose oxycodone 5 mg per tube every 8 hours -As of 2/10 pain seems somewhat improved in therapy noting that patient tolerated treatment better today with less pain and seemingly less upper extremity and truncal tone while sitting on edge of bed.   -Discussed with [name] who recommended the following: Begin Zanaflex 2 mg at HS x 2 nights then transition to TID dosing.  He also recommended increasing oxycodone dosing to maximize pain control-see above regarding dose adjustment.  Will also come by and evaluate the patient next week.  He may be a candidate for local Botox injection to right hip. -12/2 Seroquel decreased to 12.5 mg in context of rhythmic head-bobbing i.e. concerns over possible EPS symptoms.  Head-bobbing not present while sleeping -Continue scheduled Tylenol for any pain associated with dystonic movements -Voltaren gel ineffective therefore will transition to CREA muscle rub  Acute hypoxemic respiratory failure/new tracheostomy tube requirement -Hypoxemia now resolved -Continue PMV training per SLP -#4 cuffless trach-capping trial at discretion of trach team -Tracheostomy tube placed on 10/3  -11/29 was evaluated by trach team; because of persistent encephalopathy and moderate  secretions currently not a candidate for decannulation at this time.  -Continue supervised PMV trials  -Continue pulmonary hygiene with Vibra vest therapy  Abnormal appearing urine -UA consistent with dehydration/hyperconcentration for free water increased per tube -CBC unremarkable except for slightly  low WBC in context of known HIV disease  Dysphagia 2/2 anoxic brain injury -Continue tube feeding per PEG -Repeat SLP evaluation on 11/23 with recommendation for n.p.o. status  Goals of care:  -Palliative care was involved during this hospitalization.   -Goals of care were discussed. remains full code.  - Palliative care recommended outpatient follow-up with palliative providers. -May need to revisit end-of-life goals of care since patient making poor progress and currently having significant pain related to hypertonicity from brain injury  Protein calorie malnutrition nutrition Status: Nutrition Problem: Increased nutrient needs Etiology: acute illness Signs/Symptoms: estimated needs Interventions: Tube feeding consisting of Osmolite 1.5 at 60 cc/h, Juven twice daily, Prosource TF 45 mL 3 times daily Estimated body mass index is 25.71 kg/m as calculated from the following:   Height as of this encounter: _0  (1.676 m).   Weight as of this encounter: 72.3 kg.   Multiple decubiti not POA Wound / Incision (Open or Dehisced) 11/17/19 Puncture Abdomen Left;Anterior;Upper G-tube insertion site  (Active)  Date First Assessed/Time First Assessed: 11/17/19 1646   Wound Type: Puncture  Location: Abdomen  Location Orientation: Left;Anterior;Upper  Wound Description (Comments): G-tube insertion site   Present on Admission: No    Assessments 11/17/2019  4:47 PM 01/04/2020  7:49 PM  Dressing Type Gauze (Comment);Tape dressing --  Dressing Changed New --  Dressing Status Clean;Dry;Intact Clean;Dry;Intact  Dressing Change Frequency PRN PRN  Site / Wound Assessment Clean;Dry;Pink Clean;Dry  Peri-wound Assessment Intact --  Margins -- Unattached edges (unapproximated)  Closure None None  Drainage Amount None Minimal  Treatment Cleansed --     No Linked orders to display      Other problems: Hypertension/grade 1 diastolic dysfunction:  -Blood pressure stable on amlodipine   -Echocardiogram this admission with preserved LVEF with evidence of grade 1 diastolic dysfunction. 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   -Continue Keppra; levetiracetam level 35.9 on 11/12 -no further seizure activity since transitioned out of ICU setting therefore will discontinue seizure precaution  HIV:  -CD4 count of 124.  -Dc'd Tivicay and Descovy infavor of Biktarvy to for eventual dc to SNF -Continue prophylactic Bactrim  Inguinal hernias -Evaluated by general surgery this admission. Documented as chronically incarcerated. Patient has been clinically stable and tolerating tube feedings and having bowel movements so unless patient obstructed would not be considered a candidate for repair. Even if obstructed consulting surgeon was not sure he would be a candidate for hernia repair regardless .   Data Reviewed: Basic Metabolic Panel: No results for input(s): NA, K, CL, CO2, GLUCOSE, BUN, CREATININE, CALCIUM, MG, PHOS in the last 168 hours. Liver Function Tests: No results for input(s): AST, ALT, ALKPHOS, BILITOT, PROT, ALBUMIN in the last 168 hours. No results for input(s): LIPASE, AMYLASE in the last 168 hours. No results for input(s): AMMONIA in the last 168 hours. CBC: Recent Labs  Lab 01/08/20 0844  WBC 3.9*  NEUTROABS 2.5  HGB 14.6  HCT 45.1  MCV 94.9  PLT 193   Cardiac Enzymes: No results for input(s): CKTOTAL, CKMB, CKMBINDEX, TROPONINI in the last 168 hours. BNP (last 3 results) No results for input(s): BNP in the last 8760 hours.  ProBNP (last 3 results) No results for input(s): PROBNP in the last 8760 hours.  CBG: Recent Labs  Lab 01/11/20 1644 01/11/20 2019 01/11/20 2248 01/12/20 0258 01/12/20 0813  GLUCAP 180* 89 158* 83 118*    No results found for this or any previous visit (from the past 240 hour(s)).   Studies: No results found.  Scheduled Meds: .  acetaminophen (TYLENOL) oral liquid 160 mg/5 mL  650 mg Per Tube Q6H  . amLODipine  10 mg Per Tube Daily  . baclofen  30 mg Per Tube TID  . chlorhexidine gluconate (MEDLINE KIT)  15 mL Mouth Rinse BID  . Darunavir-Cobicisctat-Emtricitabine-Tenofovir Alafenamide  1 tablet Oral Q breakfast  . famotidine  10.4 mg Per Tube BID  . feeding supplement (OSMOLITE 1.5 CAL)  237 mL Per Tube Q24H  . feeding supplement (OSMOLITE 1.5 CAL)  474 mL Per Tube TID  . feeding supplement (PROSource TF)  45 mL Per Tube Daily  . fiber  1 packet Per Tube BID  . folic acid  1 mg Per Tube Daily  . free water  200 mL Per Tube Q4H  . gabapentin  200 mg Per Tube Q8H  . heparin injection (subcutaneous)  5,000 Units Subcutaneous Q8H  . ibuprofen  600 mg Per Tube Q6H  . levETIRAcetam  1,500 mg Per Tube BID  . Muscle Rub   Topical QID  . nystatin   Topical TID  . oxyCODONE  5 mg Per Tube Q12H  . QUEtiapine  12.5 mg Per Tube QHS  . scopolamine  1 patch Transdermal Q72H  . sulfamethoxazole-trimethoprim  1 tablet Per Tube Once  per day on Mon Wed Fri  . thiamine  100 mg Per Tube Daily   Continuous Infusions:   Active Problems:   Cardiac arrest [facility])   Community acquired pneumonia of left upper lobe of lung   Anoxic brain injury ([place])   Alcohol abuse   Acute respiratory failure with hypoxia (HCC)   Vomiting   Pressure injury of skin   Small bowel obstruction ([place])   Palliative care encounter   Bowel obstruction (HCC)   Chronic respiratory failure (HCC)   Diastolic dysfunction   Tracheostomy dependent ([place])   Dysphagia   Protein-calorie malnutrition ([place])   Seizures ([place])   Bilateral recurrent inguinal hernia   Muscle spasticity   Consultants:  PCCM  Palliative medicine  General surgery  Neurology  Interventional radiology  Procedures:  EEG  Echocardiogram  Tracheostomy  Antibiotics: Anti-infectives (From admission, onward)   Start     Dose/Rate Route Frequency Ordered Stop   12/19/19  0930  Darunavir-Cobicisctat-Emtricitabine-Tenofovir Alafenamide (SYMTUZA) 800-150-200-10 MG TABS 1 tablet        1 tablet Oral Daily with breakfast 12/19/19 0840     12/13/19 1000  bictegravir-emtricitabine-tenofovir AF (BIKTARVY) 50-200-25 MG per tablet 1 tablet  Status:  Discontinued        1 tablet Oral Daily 12/12/19 1413 12/19/19 0840   11/17/19 1548  ceFAZolin (ANCEF) 2-4 GM/100ML-% IVPB       Note to Pharmacy: Domenick Bookbinder   : cabinet override      11/17/19 1548 11/18/19 0359   11/16/19 1515  ceFAZolin (ANCEF) IVPB 2g/100 mL premix        2 g 200 mL/hr over 30 Minutes Intravenous To Radiology 11/16/19 1511 11/17/19 1625   11/15/19 0900  sulfamethoxazole-trimethoprim (BACTRIM DS) 800-160 MG per tablet 1 tablet        1 tablet Per Tube Once per day on Mon Wed Fri 11/13/19 1906     11/13/19 1615  dolutegravir (TIVICAY) tablet 50 mg  Status:  Discontinued        50 mg Oral Daily 11/13/19 1521 12/12/19 1413   11/13/19 1615  emtricitabine-tenofovir AF (DESCOVY) 200-25 MG per tablet 1 tablet  Status:  Discontinued        1 tablet Per Tube Daily 11/13/19 1521 12/12/19 1413   11/13/19 1530  sulfamethoxazole-trimethoprim (BACTRIM DS) 800-160 MG per tablet 1 tablet  Status:  Discontinued        1 tablet Oral Once per day on Mon Wed Fri 11/13/19 1441 11/13/19 1906   11/13/19 1500  bictegravir-emtricitabine-tenofovir AF (BIKTARVY) 50-200-25 MG per tablet 1 tablet  Status:  Discontinued        1 tablet Oral Daily 11/13/19 1403 11/13/19 1521   11/10/19 1000  erythromycin 250 mg in sodium chloride 0.9 % 100 mL IVPB        250 mg 100 mL/hr over 60 Minutes Intravenous Every 8 hours 11/10/19 0907 11/11/19 2000   11/07/19 1200  ceFEPIme (MAXIPIME) 2 g in sodium chloride 0.9 % 100 mL IVPB        2 g 200 mL/hr over 30 Minutes Intravenous Every 8 hours 11/07/19 1013 11/13/19 2127   11/03/19 1200  cefTRIAXone (ROCEPHIN) 2 g in sodium chloride 0.9 % 100 mL IVPB        2 g 200 mL/hr over 30 Minutes  Intravenous Every 24 hours 11/03/19 0922 11/05/19 1427   11/02/19 0800  vancomycin (VANCOREADY) IVPB 750 mg/150 mL  Status:  Discontinued  750 mg 150 mL/hr over 60 Minutes Intravenous Every 12 hours 11/01/19 1935 11/02/19 1105   11/02/19 0600  piperacillin-tazobactam (ZOSYN) IVPB 3.375 g  Status:  Discontinued        3.375 g 12.5 mL/hr over 240 Minutes Intravenous Every 8 hours 11/02/19 0311 11/03/19 0920   11/01/19 2200  ceFEPIme (MAXIPIME) 2 g in sodium chloride 0.9 % 100 mL IVPB  Status:  Discontinued        2 g 200 mL/hr over 30 Minutes Intravenous Every 12 hours 11/01/19 2143 11/02/19 0311   11/01/19 1915  vancomycin (VANCOREADY) IVPB 1500 mg/300 mL        1,500 mg 150 mL/hr over 120 Minutes Intravenous  Once 11/01/19 1909 11/01/19 2147   11/01/19 1845  cefTRIAXone (ROCEPHIN) 1 g in sodium chloride 0.9 % 100 mL IVPB        1 g 200 mL/hr over 30 Minutes Intravenous  Once 11/01/19 1842 11/01/19 2051   11/01/19 1845  azithromycin (ZITHROMAX) 500 mg in sodium chloride 0.9 % 250 mL IVPB        500 mg 250 mL/hr over 60 Minutes Intravenous  Once 11/01/19 1842 11/01/19 2051       Time spent: 30 minutes    Erin Hearing ANP  [facility]  Pager [phone].  01/12/2020, 11:23 AM  LOS: 72 days

## 2020-01-13 LAB — GLUCOSE, CAPILLARY
Glucose-Capillary: 76 mg/dL (ref 70–99)
Glucose-Capillary: 83 mg/dL (ref 70–99)
Glucose-Capillary: 126 mg/dL — ABNORMAL HIGH (ref 70–99)
Glucose-Capillary: 77 mg/dL (ref 70–99)
Glucose-Capillary: 93 mg/dL (ref 70–99)
Glucose-Capillary: 130 mg/dL — ABNORMAL HIGH (ref 70–99)
Glucose-Capillary: 163 mg/dL — ABNORMAL HIGH (ref 70–99)

## 2020-01-13 LAB — BASIC METABOLIC PANEL
Anion gap: 12 (ref 5–15)
BUN: 19 mg/dL (ref 6–20)
CO2: 23 mmol/L (ref 22–32)
Calcium: 9.8 mg/dL (ref 8.9–10.3)
Chloride: 108 mmol/L (ref 98–111)
Creatinine, Ser: 0.79 mg/dL (ref 0.61–1.24)
GFR, Estimated: >60 mL/min (ref >60)
Glucose, Bld: 98 mg/dL (ref 70–99)
Potassium: 3.7 mmol/L (ref 3.5–5.1)
Sodium: 143 mmol/L (ref 135–145)

## 2020-01-13 LAB — CBC
HCT: 45.1 % (ref 39.0–52.0)
Hemoglobin: 14.1 g/dL (ref 13.0–17.0)
MCH: 30.9 pg (ref 26.0–34.0)
MCHC: 31.3 g/dL (ref 30.0–36.0)
MCV: 98.7 fL (ref 80.0–100.0)
Platelets: 154 10*3/uL (ref 150–400)
RBC: 4.57 MIL/uL (ref 4.22–5.81)
RDW: 13.2 % (ref 11.5–15.5)
WBC: 4.3 10*3/uL (ref 4.0–10.5)
nRBC: 0 % (ref 0.0–0.2)

## 2020-01-13 NOTE — Plan of Care (Signed)
  Problem: Education: Goal: Knowledge of General Education information will improve Description: Including pain rating scale, medication(s)/side effects and non-pharmacologic comfort measures Outcome: Progressing   Problem: Health Behavior/Discharge Planning: Goal: Ability to manage health-related needs will improve Outcome: Progressing   Problem: Clinical Measurements: Goal: Ability to maintain clinical measurements within normal limits will improve Outcome: Progressing Goal: Will remain free from infection Outcome: Progressing Goal: Diagnostic test results will improve Outcome: Progressing Goal: Respiratory complications will improve Outcome: Progressing Goal: Cardiovascular complication will be avoided Outcome: Progressing   Problem: Activity: Goal: Risk for activity intolerance will decrease Outcome: Progressing   Problem: Nutrition: Goal: Adequate nutrition will be maintained Outcome: Progressing   Problem: Coping: Goal: Level of anxiety will decrease Outcome: Progressing   Problem: Elimination: Goal: Will not experience complications related to bowel motility Outcome: Progressing Goal: Will not experience complications related to urinary retention Outcome: Progressing   Problem: Pain Managment: Goal: General experience of comfort will improve Outcome: Progressing   Problem: Safety: Goal: Ability to remain free from injury will improve Outcome: Progressing   Problem: Skin Integrity: Goal: Risk for impaired skin integrity will decrease Outcome: Progressing   Problem: Education: Goal: 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 Outcome: Progressing   Problem: Activity: Goal: Ability to tolerate increased activity will improve Outcome: Progressing   Problem: Health Behavior/Discharge Planning: Goal: 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 Outcome: Progressing   Problem: Respiratory: Goal: Patent airway maintenance will  improve Outcome: Progressing   Problem: Role Relationship: Goal: Ability to communicate will improve Outcome: Progressing   Problem: Education: Goal: Knowledge of the prescribed therapeutic regimen Outcome: Progressing Goal: Knowledge of disease or condition will improve Outcome: Progressing   Problem: Clinical Measurements: Goal: Neurologic status will improve Outcome: Progressing   Problem: Tissue Perfusion: Goal: Ability to maintain intracranial pressure will improve Outcome: Progressing   Problem: Respiratory: Goal: Will regain and/or maintain adequate ventilation Outcome: Progressing   Problem: Skin Integrity: Goal: Risk for impaired skin integrity will decrease Outcome: Progressing Goal: Demonstration of wound healing without infection will improve Outcome: Progressing   Problem: Psychosocial: Goal: Ability to verbalize positive feelings about self will improve Outcome: Progressing Goal: Ability to participate in self-care as condition permits will improve Outcome: Progressing Goal: Ability to identify appropriate support needs will improve Outcome: Progressing   Problem: Health Behavior/Discharge Planning: Goal: Ability to manage health-related needs will improve Outcome: Progressing   Problem: Nutritional: Goal: Risk of aspiration will decrease Outcome: Progressing Goal: Dietary intake will improve Outcome: Progressing   Problem: Communication: Goal: Ability to communicate needs accurately will improve Outcome: Progressing   

## 2020-01-13 NOTE — Progress Notes (Signed)
PROGRESS NOTE    [place]  BVQ:[id number] DOB: 06-03-1965 DOA: 11/01/2019 PCP: Default, Provider, MD  Outpatient Specialists:    Brief Narrative:  Patient is a 54 year old African-American male past medical history significant for HIV, alcohol, admitted to the hospital following unresponsiveness outside of a convenience store.  EMS found him pulseless in PEA, unknown downtime, successfully resuscitated and brought to the emergency department. UDS positive for THC/benzodiazepines. CT head unremarkable. Failed extubation trials due to severe anoxic brain injury and had tracheostomy placed. PEG placed on 10/15.    Significant Hospital Events   9/29 Admit post PEA arrest. UDS positive for THC, benzo's. ETOH 177 10/05 EEG ongoing. Versed restarted overnight due to agitation. Nicardipine increased.  10/06 Developed tachypnea with WUA, no follow commands off sedation  10/07 Vomiting, TF held / restarted with recurrent vomiting 10/16 tracheostomy collar for 9 hours 10/18->10/19 off vent all night  10/20> TC 11/15 > downsized to #4 Shiley flex CFS  01/13/2020: Patient seen alongside patient's nurse.  No new changes.  Patient is unable to give any significant history.   Assessment & Plan:   Active Problems:   Cardiac arrest [facility])   Community acquired pneumonia of left upper lobe of lung   Anoxic brain injury ([place])   Alcohol abuse   Acute respiratory failure with hypoxia (HCC)   Vomiting   Pressure injury of skin   Small bowel obstruction (HCC)   Palliative care encounter   Bowel obstruction (HCC)   Chronic respiratory failure (HCC)   Diastolic dysfunction   Tracheostomy dependent (HCC)   Dysphagia   Protein-calorie malnutrition (HCC)   Seizures (HCC)   Bilateral recurrent inguinal hernia   Muscle spasticity  Anoxic brain injury 2/2 PEA arrest/pain syndrome secondary to hypertonicity:  -Presumed 2/2 combination alcohol and BZD overdose with UDS positive for THC  and BZDs -Continue PT/OT/SLP -Baclofen 20 mg TID resumed on the afternoon of 12/7.  No significant improvement in hypertonicity so will increase to 30 mg -Continue low-dose gabapentin to help with pain from hypertonicity (added on 12/8) -Continue scheduled Tylenol for any pain associated with dystonic movements -Voltaren gel ineffective therefore will transition to CREA muscle rub 01/13/2020: No new changes.  Patient is stable.  Patient is awaiting disposition.  Acute hypoxemic respiratory failure/new tracheostomy tube requirement -11/29 was evaluated by trach team -Tracheostomy tube placed on 10/3  -Continue pulmonary hygiene with Vibra vest therapy  Dysphagia 2/2 anoxic brain injury -Continue tube feeding per PEG  Goals of care:  -Palliative care was consulted.   -Goals of care were discussed. remains full code.  -Palliative care recommended outpatient follow-up with palliative providers.  Protein calorie malnutrition nutrition Status: -Continue tube feeds as per dietary team.   Multiple decubiti not POA  Hypertension/grade 1 diastolic dysfunction:  -Stable. -Continue amlodipine.   -Echocardiogram this admission with preserved LVEF with evidence of grade 1 diastolic dysfunction. 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  -Continue Keppra; levetiracetam level 35.9 on 11/12  HIV: -CD4 count of 124.  -Patient is currently on [place] -Continue prophylactic Bactrim  Inguinal hernias -Evaluated by general surgery this admission. -Documented as chronically incarcerated. -Patient has been clinically stable and tolerating tube feedings and having bowel movements -Unless patient obstructed, patient would not be considered a candidate for repair.   DVT prophylaxis: Subcutaneous heparin. Code Status: Full code Family Communication:  Disposition Plan: This will depend on hospital course.  SNF  recommended by OT.  Consultants:   Neurology, surgery and palliative medicine.  Procedures:   Tracheostomy placement.  EEG.  Echocardiogram.  Antimicrobials:   Double strength Bactrim for prophylaxis.   Subjective: Patient is unable to give any history.  Objective: Vitals:   01/12/20 2154 01/12/20 2308 01/13/20 0309 01/13/20 0312  BP: 116/69   116/84  Pulse: 92 87 86 79  Resp: 19 18 18 18   Temp: 98.2 F (36.8 C)   98.4 F (36.9 C)  TempSrc: Axillary   Axillary  SpO2: 96% 95% 97% 100%  Weight:      Height:        Intake/Output Summary (Last 24 hours) at 01/13/2020 0946 Last data filed at 01/13/2020 0500 Gross per 24 hour  Intake --  Output 1500 ml  Net -1500 ml   Filed Weights   01/09/20 0106 01/10/20 2105 01/11/20 0224  Weight: 72.7 kg 72.3 kg 72.3 kg    Examination:  General exam: Appears calm and comfortable.  Patient is awake, but does not follow command.  Patient seems to have right-sided contracture. Respiratory system: Clear to auscultation.  Cardiovascular system: S1 & S2 heard. Gastrointestinal system: Abdomen is nondistended, soft and nontender. No organomegaly or masses felt. Normal bowel sounds heard. Central nervous system: Awake and alert.  Patient would not follow commands.   Extremities: No leg edema.  Data Reviewed: I have personally reviewed following labs and imaging studies  CBC: Recent Labs  Lab 01/08/20 0844 01/13/20 0141  WBC 3.9* 4.3  NEUTROABS 2.5  --   HGB 14.6 14.1  HCT 45.1 45.1  MCV 94.9 98.7  PLT 193 768   Basic Metabolic Panel: Recent Labs  Lab 01/13/20 0141  NA 143  K 3.7  CL 108  CO2 23  GLUCOSE 98  BUN 19  CREATININE 0.79  CALCIUM 9.8   GFR: Estimated Creatinine Clearance: 94.1 mL/min (by C-G formula based on SCr of 0.79 mg/dL). Liver Function Tests: No results for input(s): AST, ALT, ALKPHOS, BILITOT, PROT, ALBUMIN in the last 168 hours. No results for input(s): LIPASE, AMYLASE in the last 168  hours. No results for input(s): AMMONIA in the last 168 hours. Coagulation Profile: No results for input(s): INR, PROTIME in the last 168 hours. Cardiac Enzymes: No results for input(s): CKTOTAL, CKMB, CKMBINDEX, TROPONINI in the last 168 hours. BNP (last 3 results) No results for input(s): PROBNP in the last 8760 hours. HbA1C: No results for input(s): HGBA1C in the last 72 hours. CBG: Recent Labs  Lab 01/12/20 1645 01/12/20 2017 01/13/20 0202 01/13/20 0417 01/13/20 0736  GLUCAP 97 100* 76 83 126*   Lipid Profile: No results for input(s): CHOL, HDL, LDLCALC, TRIG, CHOLHDL, LDLDIRECT in the last 72 hours. Thyroid Function Tests: No results for input(s): TSH, T4TOTAL, FREET4, T3FREE, THYROIDAB in the last 72 hours. Anemia Panel: No results for input(s): VITAMINB12, FOLATE, FERRITIN, TIBC, IRON, RETICCTPCT in the last 72 hours. Urine analysis:    Component Value Date/Time   COLORURINE YELLOW 01/09/2020 0957   APPEARANCEUR HAZY (A) 01/09/2020 0957   LABSPEC 1.024 01/09/2020 0957   PHURINE 5.0 01/09/2020 0957   GLUCOSEU NEGATIVE 01/09/2020 0957   HGBUR NEGATIVE 01/09/2020 0957   BILIRUBINUR NEGATIVE 01/09/2020 0957   KETONESUR NEGATIVE 01/09/2020 0957   PROTEINUR NEGATIVE 01/09/2020 0957   NITRITE NEGATIVE 01/09/2020 0957   LEUKOCYTESUR NEGATIVE 01/09/2020 0957   Sepsis Labs: @LABRCNTIP (procalcitonin:4,lacticidven:4)  )No results found for this or any previous visit (from the past 240 hour(s)).       Radiology  Studies: No results found.      Scheduled Meds: . acetaminophen (TYLENOL) oral liquid 160 mg/5 mL  650 mg Per Tube Q6H  . amLODipine  10 mg Per Tube Daily  . baclofen  30 mg Per Tube TID  . chlorhexidine gluconate (MEDLINE KIT)  15 mL Mouth Rinse BID  . Darunavir-Cobicisctat-Emtricitabine-Tenofovir Alafenamide  1 tablet Oral Q breakfast  . famotidine  10.4 mg Per Tube BID  . feeding supplement (OSMOLITE 1.5 CAL)  237 mL Per Tube Q24H  . feeding  supplement (OSMOLITE 1.5 CAL)  474 mL Per Tube TID  . feeding supplement (PROSource TF)  45 mL Per Tube Daily  . fiber  1 packet Per Tube BID  . folic acid  1 mg Per Tube Daily  . free water  200 mL Per Tube Q4H  . gabapentin  200 mg Per Tube Q8H  . heparin injection (subcutaneous)  5,000 Units Subcutaneous Q8H  . ibuprofen  600 mg Per Tube Q6H  . levETIRAcetam  1,500 mg Per Tube BID  . Muscle Rub   Topical QID  . nystatin   Topical TID  . oxyCODONE  5 mg Per Tube Q8H  . QUEtiapine  12.5 mg Per Tube QHS  . scopolamine  1 patch Transdermal Q72H  . sulfamethoxazole-trimethoprim  1 tablet Per Tube Once per day on Mon Wed Fri  . thiamine  100 mg Per Tube Daily  . tiZANidine  2 mg Per Tube QHS   Followed by  . [START ON 01/14/2020] tiZANidine  2 mg Per Tube TID   Continuous Infusions:   LOS: 73 days    Time spent: 35 minutes.      Dana Allan, MD  [facility] Pager #: [phone] 7PM-7AM contact night coverage as above

## 2020-01-14 LAB — GLUCOSE, CAPILLARY
Glucose-Capillary: 68 mg/dL — ABNORMAL LOW (ref 70–99)
Glucose-Capillary: 145 mg/dL — ABNORMAL HIGH (ref 70–99)
Glucose-Capillary: 143 mg/dL — ABNORMAL HIGH (ref 70–99)
Glucose-Capillary: 89 mg/dL (ref 70–99)
Glucose-Capillary: 117 mg/dL — ABNORMAL HIGH (ref 70–99)
Glucose-Capillary: 92 mg/dL (ref 70–99)
Glucose-Capillary: 133 mg/dL — ABNORMAL HIGH (ref 70–99)

## 2020-01-14 MED ORDER — DEXTROSE 50 % IV SOLN
1.0000 | Freq: Once | INTRAVENOUS | Status: AC
  Administered 2020-01-14: 50 mL via INTRAVENOUS
  Filled 2020-01-14: qty 50

## 2020-01-14 NOTE — Plan of Care (Signed)
  Problem: Clinical Measurements: Goal: Ability to maintain clinical measurements within normal limits will improve Outcome: Progressing Goal: Will remain free from infection Outcome: Progressing   

## 2020-01-14 NOTE — Progress Notes (Signed)
PROGRESS NOTE    Ryan Orozco  WGN:[id number] DOB: May 07, 1965 DOA: 11/01/2019 PCP: Default, Provider, MD  Outpatient Specialists:    Brief Narrative:  Patient is a 54 year old African-American male past medical history significant for HIV, alcohol, admitted to the hospital following unresponsiveness outside of a convenience store.  EMS found him pulseless in PEA, unknown downtime, successfully resuscitated and brought to the emergency department. UDS positive for THC/benzodiazepines. CT head unremarkable. Failed extubation trials due to severe anoxic brain injury and had tracheostomy placed. PEG placed on 10/15.    Significant Hospital Events   9/29 Admit post PEA arrest. UDS positive for THC, benzo's. ETOH 177 10/05 EEG ongoing. Versed restarted overnight due to agitation. Nicardipine increased.  10/06 Developed tachypnea with WUA, no follow commands off sedation  10/07 Vomiting, TF held / restarted with recurrent vomiting 10/16 tracheostomy collar for 9 hours 10/18->10/19 off vent all night  10/20> TC 11/15 > downsized to #4 Shiley flex CFS  01/13/2020: Patient seen alongside patient's nurse.  No new changes.  Patient is unable to give any significant history. 01/14/2020: Patient seen.  No new changes.   Assessment & Plan:   Active Problems:   Cardiac arrest [facility])   Community acquired pneumonia of left upper lobe of lung   Anoxic brain injury ([place])   Alcohol abuse   Acute respiratory failure with hypoxia ([facility])   Vomiting   Pressure injury of skin   Small bowel obstruction (HCC)   Palliative care encounter   Bowel obstruction (HCC)   Chronic respiratory failure (H[facility])   Diastolic dysfunction   Tracheostomy dependent (HCC)   Dysphagia   Protein-calorie malnutrition (HCC)   Seizures (HCC)   Bilateral recurrent inguinal hernia   Muscle spasticity  Anoxic brain injury 2/2 PEA arrest/pain syndrome secondary to hypertonicity:  -Presumed 2/2 combination alcohol  and BZD overdose with UDS positive for THC and BZDs -Continue PT/OT/SLP -Baclofen 20 mg TID resumed on the afternoon of 12/7.  No significant improvement in hypertonicity so will increase to 30 mg -Continue low-dose gabapentin to help with pain from hypertonicity (added on 12/8) -Continue scheduled Tylenol for any pain associated with dystonic movements -Voltaren gel ineffective therefore will transition to CREA muscle rub 01/14/2020: No new changes.  Patient is stable.  Patient is awaiting disposition.  Acute hypoxemic respiratory failure/new tracheostomy tube requirement -11/29 was evaluated by trach team -Tracheostomy tube placed on 10/3  -Continue pulmonary hygiene with Vibra vest therapy  Dysphagia 2/2 anoxic brain injury -Continue tube feeding per PEG  Goals of care:  -Palliative care was consulted.   -Goals of care were discussed. remains full code.  -Palliative care recommended outpatient follow-up with palliative providers.  Protein calorie malnutrition nutrition Status: -Continue tube feeds as per dietary team.   Multiple decubiti not POA  Hypertension/grade 1 diastolic dysfunction:  -Stable. -Continue amlodipine.   -Echocardiogram this admission with preserved LVEF with evidence of grade 1 diastolic dysfunction. 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  -Continue Keppra; levetiracetam level 35.9 on 11/12  HIV: -CD4 count of 124.  -Patient is currently on [place] -Continue prophylactic Bactrim  Inguinal hernias -Evaluated by general surgery this admission. -Documented as chronically incarcerated. -Patient has been clinically stable and tolerating tube feedings and having bowel movements -Unless patient obstructed, patient would not be considered a candidate for repair.   DVT prophylaxis: Subcutaneous heparin. Code Status: Full code Family Communication:  Disposition Plan:  This will depend on hospital  course.  SNF recommended by OT.   Consultants:   Neurology, surgery and palliative medicine.  Procedures:   Tracheostomy placement.  EEG.  Echocardiogram.  Antimicrobials:   Double strength Bactrim for prophylaxis.   Subjective: Patient is unable to give any history.  Objective: Vitals:   01/13/20 2203 01/13/20 2315 01/14/20 0424 01/14/20 0642  BP: 135/81   129/86  Pulse: 100 97 80 85  Resp: _0 (!) 21  Temp: 99 F (37.2 C)   98.1 F (36.7 C)  TempSrc: Axillary   Axillary  SpO2: 97% 98% 99% 98%  Weight:      Height:        Intake/Output Summary (Last 24 hours) at 01/14/2020 1723 Last data filed at 01/14/2020 0700 Gross per 24 hour  Intake --  Output 550 ml  Net -550 ml   Filed Weights   01/09/20 0106 01/10/20 2105 01/11/20 0224  Weight: 72.7 kg 72.3 kg 72.3 kg    Examination:  General exam: Appears calm and comfortable.  Patient is awake, but does not follow command.  Patient seems to have right-sided contracture. Respiratory system: Clear to auscultation.  Cardiovascular system: S1 & S2 heard. Gastrointestinal system: Abdomen is nondistended, soft and nontender. No organomegaly or masses felt. Normal bowel sounds heard. Central nervous system: Awake and alert.  Patient would not follow commands.   Extremities: No leg edema.  Data Reviewed: I have personally reviewed following labs and imaging studies  CBC: Recent Labs  Lab 01/08/20 0844 01/13/20 0141  WBC 3.9* 4.3  NEUTROABS 2.5  --   HGB 14.6 14.1  HCT 45.1 45.1  MCV 94.9 98.7  PLT 193 607   Basic Metabolic Panel: Recent Labs  Lab 01/13/20 0141  NA 143  K 3.7  CL 108  CO2 23  GLUCOSE 98  BUN 19  CREATININE 0.79  CALCIUM 9.8   GFR: Estimated Creatinine Clearance: 94.1 mL/min (by C-G formula based on SCr of 0.79 mg/dL). Liver Function Tests: No results for input(s): AST, ALT, ALKPHOS, BILITOT, PROT, ALBUMIN in the last 168 hours. No results for  input(s): LIPASE, AMYLASE in the last 168 hours. No results for input(s): AMMONIA in the last 168 hours. Coagulation Profile: No results for input(s): INR, PROTIME in the last 168 hours. Cardiac Enzymes: No results for input(s): CKTOTAL, CKMB, CKMBINDEX, TROPONINI in the last 168 hours. BNP (last 3 results) No results for input(s): PROBNP in the last 8760 hours. HbA1C: No results for input(s): HGBA1C in the last 72 hours. CBG: Recent Labs  Lab 01/14/20 0316 01/14/20 0413 01/14/20 0739 01/14/20 1124 01/14/20 1544  GLUCAP 68* 145* 143* 89 117*   Lipid Profile: No results for input(s): CHOL, HDL, LDLCALC, TRIG, CHOLHDL, LDLDIRECT in the last 72 hours. Thyroid Function Tests: No results for input(s): TSH, T4TOTAL, FREET4, T3FREE, THYROIDAB in the last 72 hours. Anemia Panel: No results for input(s): VITAMINB12, FOLATE, FERRITIN, TIBC, IRON, RETICCTPCT in the last 72 hours. Urine analysis:    Component Value Date/Time   COLORURINE YELLOW 01/09/2020 0957   APPEARANCEUR HAZY (A) 01/09/2020 0957   LABSPEC 1.024 01/09/2020 0957   PHURINE 5.0 01/09/2020 0957   GLUCOSEU NEGATIVE 01/09/2020 0957   HGBUR NEGATIVE 01/09/2020 0957   BILIRUBINUR NEGATIVE 01/09/2020 0957   KETONESUR NEGATIVE 01/09/2020 0957   PROTEINUR NEGATIVE 01/09/2020 0957   NITRITE NEGATIVE 01/09/2020 0957   LEUKOCYTESUR NEGATIVE 01/09/2020 0957   Sepsis Labs: _1 (procalcitonin:4,lacticidven:4)  )No results found for this or any previous visit (from the past  240 hour(s)).       Radiology Studies: No results found.      Scheduled Meds: . acetaminophen (TYLENOL) oral liquid 160 mg/5 mL  650 mg Per Tube Q6H  . amLODipine  10 mg Per Tube Daily  . baclofen  30 mg Per Tube TID  . chlorhexidine gluconate (MEDLINE KIT)  15 mL Mouth Rinse BID  . Darunavir-Cobicisctat-Emtricitabine-Tenofovir Alafenamide  1 tablet Oral Q breakfast  . famotidine  10.4 mg Per Tube BID  . feeding supplement (OSMOLITE 1.5  CAL)  237 mL Per Tube Q24H  . feeding supplement (OSMOLITE 1.5 CAL)  474 mL Per Tube TID  . feeding supplement (PROSource TF)  45 mL Per Tube Daily  . fiber  1 packet Per Tube BID  . folic acid  1 mg Per Tube Daily  . free water  200 mL Per Tube Q4H  . gabapentin  200 mg Per Tube Q8H  . heparin injection (subcutaneous)  5,000 Units Subcutaneous Q8H  . ibuprofen  600 mg Per Tube Q6H  . levETIRAcetam  1,500 mg Per Tube BID  . Muscle Rub   Topical QID  . nystatin   Topical TID  . oxyCODONE  5 mg Per Tube Q8H  . QUEtiapine  12.5 mg Per Tube QHS  . scopolamine  1 patch Transdermal Q72H  . sulfamethoxazole-trimethoprim  1 tablet Per Tube Once per day on Mon Wed Fri  . thiamine  100 mg Per Tube Daily  . tiZANidine  2 mg Per Tube TID   Continuous Infusions:   LOS: 74 days    Time spent: 25 minutes.      Dana Allan, MD  [facility] Pager #: [phone] 7PM-7AM contact night coverage as above

## 2020-01-15 LAB — COMPREHENSIVE METABOLIC PANEL
ALT: 24 U/L (ref 0–44)
AST: 29 U/L (ref 15–41)
Albumin: 3.2 g/dL — ABNORMAL LOW (ref 3.5–5.0)
Alkaline Phosphatase: 104 U/L (ref 38–126)
Anion gap: 12 (ref 5–15)
BUN: 14 mg/dL (ref 6–20)
CO2: 27 mmol/L (ref 22–32)
Calcium: 10.1 mg/dL (ref 8.9–10.3)
Chloride: 105 mmol/L (ref 98–111)
Creatinine, Ser: 0.76 mg/dL (ref 0.61–1.24)
GFR, Estimated: >60 mL/min (ref >60)
Glucose, Bld: 98 mg/dL (ref 70–99)
Potassium: 3.9 mmol/L (ref 3.5–5.1)
Sodium: 144 mmol/L (ref 135–145)
Total Bilirubin: 0.6 mg/dL (ref 0.3–1.2)
Total Protein: 7.3 g/dL (ref 6.5–8.1)

## 2020-01-15 LAB — GLUCOSE, CAPILLARY
Glucose-Capillary: 73 mg/dL (ref 70–99)
Glucose-Capillary: 96 mg/dL (ref 70–99)
Glucose-Capillary: 104 mg/dL — ABNORMAL HIGH (ref 70–99)
Glucose-Capillary: 134 mg/dL — ABNORMAL HIGH (ref 70–99)
Glucose-Capillary: 91 mg/dL (ref 70–99)
Glucose-Capillary: 124 mg/dL — ABNORMAL HIGH (ref 70–99)
Glucose-Capillary: 135 mg/dL — ABNORMAL HIGH (ref 70–99)

## 2020-01-15 LAB — CBC
HCT: 44.4 % (ref 39.0–52.0)
Hemoglobin: 14.6 g/dL (ref 13.0–17.0)
MCH: 31.3 pg (ref 26.0–34.0)
MCHC: 32.9 g/dL (ref 30.0–36.0)
MCV: 95.3 fL (ref 80.0–100.0)
Platelets: 162 10*3/uL (ref 150–400)
RBC: 4.66 MIL/uL (ref 4.22–5.81)
RDW: 12.9 % (ref 11.5–15.5)
WBC: 3 10*3/uL — ABNORMAL LOW (ref 4.0–10.5)
nRBC: 0 % (ref 0.0–0.2)

## 2020-01-15 NOTE — Progress Notes (Signed)
Nutrition Follow-up  DOCUMENTATION CODES:   Not applicable  INTERVENTION:  Continue bolus TF regimen via PEG: -Provide 2 cartons (496m) Osmolite 1.5 TID @ 0800, 1200, 1700 -Provide 1 carton (2329m Osmolite 1.5 once daily at bedtime @ 2100 -Flush with 6062mree water before and after each tube feeding bolus -Provide 74m66mosource daily  -Nutrisource fiber BID -Additional free water per MD, currently 200ml27m  Tube feeding regimen provides 2525 kcals, 115 grams protein,1267ml 51m water (2947ml w74mflushes)   NUTRITION DIAGNOSIS:   Increased nutrient needs related to acute illness as evidenced by estimated needs.  ongoing  GOAL:   Patient will meet greater than or equal to 90% of their needs  Met with TF  MONITOR:   Labs,Weight trends,TF tolerance,Skin,I & O's  REASON FOR ASSESSMENT:   Ventilator,Consult Enteral/tube feeding initiation and management  ASSESSMENT:   Patient with PMH significant for HIV and ETOH use. Presents this admission with cardiac arrest and suspected aspiration in setting of heavy ETOH use.  10/07- vomiting, TF held, laterrestarted with recurrent vomiting 10/12-R/L inguinal hernia, partial SBO  10/13- trach 10/15-PEG 10/18- ATC  Pt sleeping at time of RD visit.   Pt continues to be NPO. PT was transitioned to bolus TF last week and has been tolerating well per RN. Current TF regimen is as follows: Provide 2 cartons (474ml) O25mite 1.5 TID @ 0800, 1200, 1700; Provide 1 carton (237ml) Os16mte 1.5 once daily at bedtime @ 2100; flush with 60ml free41mer before and after each tube feeding bolus; Provide 74ml Proso4m TF daily; Nutrisource fiber BID. Pt also receiving an additional 200ml free w24m Q4H.  Tube feeding regimen provides 2525 kcals, 115 grams protein,1267ml free wa93m(2947ml with flu46m)  Pt also continues on ATC. Per CCM, hesitatant to decannulate until he can reliably follow commands, cough, and clear  airway.  Admission weight: 78.9 kg Current weight: 71.4 kg  UOP: 725ml x24 hours30mbs reviewed.  Medicatoins: pepcid, folvite, thiamine   Diet Order:   Diet Order            Diet NPO time specified  Diet effective midnight                 EDUCATION NEEDS:   Not appropriate for education at this time  Skin:  Skin Assessment: Skin Integrity Issues: Skin Integrity Issues:: Other (Comment) Stage II: N/A Stage III: N/A Other: puncture abdomen (PEG insertion site)  Last BM:  12/11  Height:   Ht Readings from Last 1 Encounters:  11/01/19 5' 6"  (1.676 m)    Weight:   Wt Readings from Last 1 Encounters:  01/15/20 71.4 kg    BMI:  Body mass index is 25.41 kg/m.  Estimated Nutritional Needs:   Kcal:  2300-2500  [id number] g  Fluid:  >/= 2 L/day    Allyn Bertoni,Larkin InaD pager number and weekend/on-call pager number located in [email or web address]

## 2020-01-15 NOTE — Progress Notes (Signed)
[facility] PROGRESS NOTE  Asad [place] QZR:[id number] DOB: 06/15/1965 DOA: 11/01/2019 PCP: Default, Provider, MD    11/16   Status: Remains inpatient appropriate because:Altered mental status, Unsafe d/c plan and Inpatient level of care appropriate due to severity of illness. Patient newly diagnosed with anoxic brain injury  Dispo: The patient is from: Home              Anticipated d/c is to: SNF              Anticipated d/c date is: > 3 days              Patient currently is medically stable to d/c.  PMV training in process. Medicaid application in process. Patient will also need to have disability paperwork completed since do not expect full recovery.  Trach Tube has been in place greater than 30 days.   Code Status: Full Family Communication: 12/06 dtr [name] by telephone DVT prophylaxis: Subcutaneous heparin Vaccination status: Does not appear to have received Covid vaccine but will need to confirm with family  Foley catheter: No  HPI: 54 year old male with HIV, chronic alcohol abuse who was presented to the emergency department after being found unresponsive outside a convinient store.  EMS found him pulseless in PEA, unknown downtime, successfully resuscitated and brought to the emergency department.  UDS positive for THC/benzodiazepines.  CT head unremarkable.  Failed extubation trials due to severe anoxic brain injury and had tracheostomy placed.  PEG placed on 10/15.    Significant Hospital Events   9/29 Admit post PEA arrest. UDS positive for THC, benzo's. ETOH 177 10/05 EEG ongoing. Versed restarted overnight due to agitation. Nicardipine increased.  10/06 Developed tachypnea with WUA, no follow commands off sedation  10/07 Vomiting, TF held / restarted with recurrent vomiting 10/16 tracheostomy collar for 9 hours 10/18->10/19 off vent all night  10/20> TC 11/15 > downsized to #4 Shiley flex CFS  Subjective: Awake.  Significant improvement in  responsiveness affect after hypertonicity and pain management improved.  Grunts responses but did say "bye" as I was leaving room.  Did not reliably follow any commands  Objective: Vitals:   01/15/20 0504 01/15/20 0849  BP: 136/79   Pulse: 75 79  Resp: 18 18  Temp: 97.9 F (36.6 C)   SpO2: 100% 98%    Intake/Output Summary (Last 24 hours) at 01/15/2020 1106 Last data filed at 01/15/2020 0356 Gross per 24 hour  Intake --  Output 725 ml  Net -725 ml   Filed Weights   01/11/20 0224 01/14/20 2110 01/15/20 0105  Weight: 72.3 kg 71.4 kg 71.4 kg    Exam: Constitutional: NAD, calm today and appears more comfortable Respiratory: Coarse to auscultation bilaterally, #4 cuffless trach with 21% FiO2/TC -no significant tracheal secretions Cardiovascular: Regular rate and rhythm, skin warm and dry Abdomen: non tender, bowel sounds positive. PEG tube with tube feeding Musculoskeletal: Continues with mild to moderate upper extremity dystonic movements with activity.  No further rhythmic head-bobbing trinity hypertonicity markedly improved although still has significant persistent flexion R>L Skin: Several decubiti as described below Neurologic: CN 2-12 grossly intact; Sensation intact both upper and lower extremities, difficult to test strength in upper extremities but appears to be 2/5 with lower extremities 2-3/5.   Psychiatric: Responds to name and smiled briefly today.  Difficult to assess secondary to expressive aphasia from brain injury   Assessment/Plan: Acute problems: Anoxic brain injury 2/2 PEA arrest/pain syndrome secondary to hypertonicity:  -Presumed 2/2 combination alcohol and  BZD overdose with UDS positive for THC and BZDs -Do not expect he will recover fully from this event therefore long-term SNF placement recommended -Continue PT/OT/SLP -Continue baclofen 30 mg TID along with gabapentin, Zanaflex 2 mg TID, scheduled ibuprofen (x 7 days only) and scheduled oxycodone 5 mg  TID -Discussed with [name] who recommended Increasing oxycodone dose as above.  He plans to evaluate patient in person this week.  He may be a candidate for local Botox injection to right hip. -Continue low-dose Seroquel for brain injury sequela -Continue scheduled Tylenol for pain -Continue CREA muscle rub  Acute hypoxemic respiratory failure/new tracheostomy tube requirement -Hypoxemia now resolved -Continue PMV training per SLP -#4 cuffless trach-capping trial at discretion of trach team -Tracheostomy tube placed on 10/3  -Trach team following.  Hesitation regarding decannulation until patient can reliably follow commands, cough and clear airway. -Continue supervised PMV trials  -Continue Vibra vest therapy  Dysphagia 2/2 anoxic brain injury -Continue tube feeding per PEG -Repeat SLP evaluation on 11/23 with recommendation for n.p.o. status  Goals of care:  -Palliative care was involved during this hospitalization.   -Goals of care were discussed. remains full code.  - Palliative care recommended outpatient follow-up with palliative providers. -May need to revisit end-of-life goals of care since patient making poor progress and currently having significant pain related to hypertonicity from brain injury  Protein calorie malnutrition nutrition Status: Nutrition Problem: Increased nutrient needs Etiology: acute illness Signs/Symptoms: estimated needs Interventions: Tube feeding bolus doses of Osmolite, Juven twice daily, Prosource TF 45 mL 3 times daily Estimated body mass index is 25.41 kg/m as calculated from the following:   Height as of this encounter: 5' 6"  (1.676 m).   Weight as of this encounter: 71.4 kg.   Multiple decubiti not POA Wound / Incision (Open or Dehisced) 11/17/19 Puncture Abdomen Left;Anterior;Upper G-tube insertion site  (Active)  Date First Assessed/Time First Assessed: 11/17/19 1646   Wound Type: Puncture  Location: Abdomen  Location Orientation:  Left;Anterior;Upper  Wound Description (Comments): G-tube insertion site   Present on Admission: No    Assessments 11/17/2019  4:47 PM 01/14/2020  9:02 PM  Dressing Type Gauze (Comment);Tape dressing Gauze (Comment)  Dressing Changed New --  Dressing Status Clean;Dry;Intact Clean;Dry;Intact  Dressing Change Frequency PRN --  Site / Wound Assessment Clean;Dry;Pink Dressing in place / Unable to assess  Peri-wound Assessment Intact --  Closure None --  Drainage Amount None --  Treatment Cleansed --     No Linked orders to display      Other problems: Hypertension/grade 1 diastolic dysfunction:  -Blood pressure stable on amlodipine  -Echocardiogram this admission with preserved LVEF with evidence of grade 1 diastolic dysfunction. 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   -Continue Keppra; levetiracetam level 35.9 on 11/12 -no further seizure activity since transitioned out of ICU setting therefore will discontinue seizure precaution  HIV:  -CD4 count of 124.  -Dc'd Tivicay and Descovy infavor of Biktarvy to for eventual dc to SNF -Continue prophylactic Bactrim  Inguinal hernias -Evaluated by general surgery this admission. Documented as chronically incarcerated. Patient has been clinically stable and tolerating tube feedings and having bowel movements so unless patient obstructed would not be considered a candidate for repair. Even if obstructed consulting surgeon was not sure he would be a candidate for hernia repair regardless .   Data Reviewed: Basic Metabolic Panel: Recent Labs  Lab 01/13/20 0141 01/15/20 0924  NA  143 144  K 3.7 3.9  CL 108 105  CO2 23 27  GLUCOSE 98 98  BUN 19 14  CREATININE 0.79 0.76  CALCIUM 9.8 10.1   Liver Function Tests: Recent Labs  Lab 01/15/20 0924  AST 29  ALT 24  ALKPHOS 104  BILITOT 0.6  PROT 7.3  ALBUMIN 3.2*   No results for input(s): LIPASE, AMYLASE  in the last 168 hours. No results for input(s): AMMONIA in the last 168 hours. CBC: Recent Labs  Lab 01/13/20 0141 01/15/20 0924  WBC 4.3 3.0*  HGB 14.1 14.6  HCT 45.1 44.4  MCV 98.7 95.3  PLT 154 162   Cardiac Enzymes: No results for input(s): CKTOTAL, CKMB, CKMBINDEX, TROPONINI in the last 168 hours. BNP (last 3 results) No results for input(s): BNP in the last 8760 hours.  ProBNP (last 3 results) No results for input(s): PROBNP in the last 8760 hours.  CBG: Recent Labs  Lab 01/14/20 2001 01/14/20 2312 01/15/20 0311 01/15/20 0623 01/15/20 0715  GLUCAP 92 133* 73 96 104*    No results found for this or any previous visit (from the past 240 hour(s)).   Studies: No results found.  Scheduled Meds: . acetaminophen (TYLENOL) oral liquid 160 mg/5 mL  650 mg Per Tube Q6H  . amLODipine  10 mg Per Tube Daily  . baclofen  30 mg Per Tube TID  . chlorhexidine gluconate (MEDLINE KIT)  15 mL Mouth Rinse BID  . Darunavir-Cobicisctat-Emtricitabine-Tenofovir Alafenamide  1 tablet Oral Q breakfast  . famotidine  10.4 mg Per Tube BID  . feeding supplement (OSMOLITE 1.5 CAL)  237 mL Per Tube Q24H  . feeding supplement (OSMOLITE 1.5 CAL)  474 mL Per Tube TID  . feeding supplement (PROSource TF)  45 mL Per Tube Daily  . fiber  1 packet Per Tube BID  . folic acid  1 mg Per Tube Daily  . free water  200 mL Per Tube Q4H  . gabapentin  200 mg Per Tube Q8H  . heparin injection (subcutaneous)  5,000 Units Subcutaneous Q8H  . ibuprofen  600 mg Per Tube Q6H  . levETIRAcetam  1,500 mg Per Tube BID  . Muscle Rub   Topical QID  . nystatin   Topical TID  . oxyCODONE  5 mg Per Tube Q8H  . QUEtiapine  12.5 mg Per Tube QHS  . scopolamine  1 patch Transdermal Q72H  . sulfamethoxazole-trimethoprim  1 tablet Per Tube Once per day on Mon Wed Fri  . thiamine  100 mg Per Tube Daily  . 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[facility])   Dysphagia   Protein-calorie malnutrition ([place])   Seizures ([place])   Bilateral recurrent inguinal hernia   Muscle spasticity   Consultants:  PCCM  Palliative medicine  General surgery  Neurology  Interventional radiology  Procedures:  EEG  Echocardiogram  Tracheostomy  Antibiotics: Anti-infectives (From admission, onward)   Start     Dose/Rate Route Frequency Ordered Stop   12/19/19 0930  Darunavir-Cobicisctat-Emtricitabine-Tenofovir Alafenamide (SYMTUZA) 800-150-200-10 MG TABS 1 tablet        1 tablet Oral Daily with breakfast 12/19/19 0840     12/13/19 1000  bictegravir-emtricitabine-tenofovir AF (BIKTARVY) 50-200-25 MG per tablet 1 tablet  Status:  Discontinued        1 tablet Oral Daily 12/12/19 1413 12/19/19 0840   11/17/19 1548  ceFAZolin (ANCEF) 2-4 GM/100ML-% IVPB       Note to Pharmacy: Domenick Bookbinder   : cabinet override      11/17/19 1548 11/18/19 0359   11/16/19 1515  ceFAZolin (ANCEF) IVPB 2g/100 mL premix        2 g 200 mL/hr over 30 Minutes Intravenous To Radiology 11/16/19 1511 11/17/19 1625   11/15/19 0900  sulfamethoxazole-trimethoprim (BACTRIM DS) 800-160 MG per tablet 1 tablet        1 tablet Per Tube Once per day on Mon Wed Fri 11/13/19 1906     11/13/19 1615  dolutegravir (TIVICAY) tablet 50 mg  Status:  Discontinued        50 mg Oral Daily 11/13/19 1521 12/12/19 1413   11/13/19 1615  emtricitabine-tenofovir AF (DESCOVY) 200-25 MG per tablet 1 tablet  Status:  Discontinued        1 tablet Per Tube Daily 11/13/19 1521 12/12/19 1413   11/13/19 1530  sulfamethoxazole-trimethoprim (BACTRIM DS)  800-160 MG per tablet 1 tablet  Status:  Discontinued        1 tablet Oral Once per day on Mon Wed Fri 11/13/19 1441 11/13/19 1906   11/13/19 1500  bictegravir-emtricitabine-tenofovir AF (BIKTARVY) 50-200-25 MG per tablet 1 tablet  Status:  Discontinued        1 tablet Oral Daily 11/13/19 1403 11/13/19 1521   11/10/19 1000  erythromycin 250 mg in sodium chloride 0.9 % 100 mL IVPB        250 mg 100 mL/hr over 60 Minutes Intravenous Every 8 hours 11/10/19 0907 11/11/19 2000   11/07/19 1200  ceFEPIme (MAXIPIME) 2 g in sodium chloride 0.9 % 100 mL IVPB        2 g 200 mL/hr over 30 Minutes Intravenous Every 8 hours 11/07/19 1013 11/13/19 2127   11/03/19 1200  cefTRIAXone (ROCEPHIN) 2 g in sodium chloride 0.9 % 100 mL IVPB        2 g 200 mL/hr over 30 Minutes Intravenous Every 24 hours 11/03/19 0922 11/05/19 1427   11/02/19 0800  vancomycin (VANCOREADY) IVPB 750 mg/150 mL  Status:  Discontinued        750 mg 150 mL/hr over 60 Minutes Intravenous Every 12 hours 11/01/19 1935 11/02/19 1105   11/02/19 0600  piperacillin-tazobactam (ZOSYN) IVPB 3.375 g  Status:  Discontinued        3.375 g 12.5 mL/hr over 240 Minutes Intravenous Every 8 hours 11/02/19 0311 11/03/19 0920   11/01/19 2200  ceFEPIme (MAXIPIME) 2 g in sodium chloride 0.9 % 100 mL IVPB  Status:  Discontinued        2 g 200 mL/hr over 30 Minutes Intravenous Every 12 hours 11/01/19 2143 11/02/19 0311   11/01/19 1915  vancomycin (VANCOREADY) IVPB 1500 mg/300 mL        1,500 mg 150 mL/hr over 120 Minutes Intravenous  Once 11/01/19 1909 11/01/19 2147   11/01/19 1845  cefTRIAXone (ROCEPHIN) 1 g in sodium chloride 0.9 % 100 mL IVPB        1 g 200 mL/hr over 30 Minutes Intravenous  Once 11/01/19 1842 11/01/19 2051  11/01/19 1845  azithromycin (ZITHROMAX) 500 mg in sodium chloride 0.9 % 250 mL IVPB        500 mg 250 mL/hr over 60 Minutes Intravenous  Once 11/01/19 1842 11/01/19 2051       Time spent: 30 minutes    Erin Hearing  ANP  [facility]  Pager [phone].  01/15/2020, 11:06 AM  LOS: 75 days

## 2020-01-15 NOTE — Plan of Care (Signed)
  Problem: Clinical Measurements: Goal: Respiratory complications will improve Outcome: Progressing   Problem: Clinical Measurements: Goal: Cardiovascular complication will be avoided Outcome: Progressing   Problem: Respiratory: Goal: Will regain and/or maintain adequate ventilation Outcome: Progressing

## 2020-01-15 NOTE — Progress Notes (Signed)
NAMERaine Orozco, MRN:  [id number], DOB:  15-Jul-1965, LOS: 75 ADMISSION DATE:  11/01/2019, CONSULTATION DATE:  9/29 REFERRING MD:  EDP, CHIEF COMPLAINT:  Cardiac arrest   Brief History   54 y/o male with HIV found down outside a convenience store on 9/27, received out of hospital CPR.  After ICU admission has had acute encephalopathy, concern for anoxic brain injury.  Received a tracheostomy 10/13.    Past Medical History  ETOH Abuse HIV - dx 1995  Significant Hospital Events   9/29 Admit post PEA arrest.  UDS positive for THC, benzo's.  ETOH 177 10/05 EEG ongoing. Versed restarted overnight due to agitation. Nicardipine increased.  10/06 Developed tachypnea with WUA, no follow commands off sedation  10/07 Vomiting, TF held / restarted with recurrent vomiting 10/16 tracheostomy collar for 9 hours 10/18->10/19 off vent all night  10/20> TC 11/15>>#4 shiley cuffless 11/22 start capping trials Consults:  Neurology  Procedures:  ETT 9/29 >>10/13 Tracheostomy>>10/3  Significant Diagnostic Tests:  CT head 9/29 >  No evidence of acute intracranial abnormality. Moderate generalized cerebral atrophy, advanced for age. Chronic medially displaced fracture deformity of the right lamina papyracea. Mild ethmoid and maxillary sinus mucosal thickening.  CT cervical spine 9/29 > No evidence of acute fracture to the cervical spine. Partially imaged airspace disease within the imaged lung apices (extensive on the left). Clinical correlation is recommended. 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  EEG 9/29 > Patient was noted to have frequent episodes of axial jerking with eye opening. Concomitant EEG showed generalized polyspikes consistent with myoclonic seizures. EEG also showed continuous generalized background suppression. EEG was not reactive to tactile stimulation. Hyperventilation and photic stimulation  were not performed  ECHO 9/30 > No evidence of wall motion abnormality  MRI 10/3 > Mild DWI hyperintensity involving the caudate and possibly putamen bilaterally. Additionally, possible subtle FLAIR hyperintensity involving the cerebellum, cortex diffusely, and bilateral basal ganglia. Although not diagnostic, these findings could be seen with early hypoxic/ischemic brain injury in this patient status post PEA arrest.   EEG 10/08> severe diffuse encephalopathy likely related to anoxic/hypoxic brain injury  Testicular US 10/10> showed large left hydrocele and left inguinal hernia. No evidence of testicular mass.  MRI brain 10/11: evolving hypoxic, anoxic brain injury compared to previous MRI   CT abdomen pelvis 10/12: Right inguinal hernia and partial small bowel obstruction, large left inguinal hernia  Abdominal x-ray: Small bowel obstruction  Micro Data:  BCx2 9/29 >> negative  Urine 9/29 >> UA few bacteria, WBC, RBC, CaOxalate, and Mucus Covid, Flu A/B 9/29 >> negative Tracheal aspirate 10/5 >> normal flora   Antimicrobials:  Vancomycin 9/29 >> 9/30 Cefepime 9/29 >> 9/30 Cefepime 10/5 >> 10/11  Interim history/subjective:   No events. Remains encephalopathic. Intermittent thick secretions. Unable to provide any subjective.  Objective   Blood pressure 136/79, pulse 79, temperature 97.9 F (36.6 C), temperature source Axillary, resp. rate 18, height 5\' 6"  (1.676 m), weight 71.4 kg, SpO2 98 %.    FiO2 (%):  [21 %-28 %] 21 %   Intake/Output Summary (Last 24 hours) at 01/15/2020 1016 Last data filed at 01/15/2020 0356 Gross per 24 hour  Intake --  Output 725 ml  Net -725 ml   Filed Weights   01/11/20 0224 01/14/20 2110 01/15/20 0105  Weight: 72.3 kg 71.4 kg 71.4 kg   Physical Exam: Constitutional: no acute distress  Eyes: EOMI,  tracking, pupils equal Ears, nose, mouth, and throat: trach in place, small thick secretions Cardiovascular: RRR, ext  warm Respiratory: Scattered rhonci, no accessory muscle sue Gastrointestinal: Soft, +BS Skin: No rashes, normal turgor Neurologic: moves all 4 ext, not following commands for me, moaning/aphasia noted Psychiatric: RASS 0   Resolved Hospital Problem list   Partial small bowel obstruction > resolved  Assessment & Plan:  54 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 S/p trach due to failure to wean from ventilator.  Trach in place for airway protection Anoxic brain injury secondary to cardiac arrest c/b myoclonic seizures P: Continue ATC. Hesitate to decannulate until he can reliably follow commands, cough, and clear airway   Pulmonary team will follow weekly  Myrla Halsted MD PCCM

## 2020-01-15 NOTE — Plan of Care (Signed)
  Problem: Clinical Measurements: Goal: Respiratory complications will improve Outcome: Progressing   Problem: Clinical Measurements: Goal: Cardiovascular complication will be avoided Outcome: Progressing   Problem: Nutrition: Goal: Adequate nutrition will be maintained Outcome: Progressing   

## 2020-01-15 NOTE — Plan of Care (Signed)
  Problem: Education: Goal: Knowledge of General Education information will improve Description: Including pain rating scale, medication(s)/side effects and non-pharmacologic comfort measures Outcome: Progressing   Problem: Health Behavior/Discharge Planning: Goal: Ability to manage health-related needs will improve Outcome: Progressing   Problem: Clinical Measurements: Goal: Ability to maintain clinical measurements within normal limits will improve Outcome: Progressing Goal: Will remain free from infection Outcome: Progressing Goal: Diagnostic test results will improve Outcome: Progressing Goal: Respiratory complications will improve Outcome: Progressing Goal: Cardiovascular complication will be avoided Outcome: Progressing   Problem: Activity: Goal: Risk for activity intolerance will decrease Outcome: Progressing   Problem: Nutrition: Goal: Adequate nutrition will be maintained Outcome: Progressing   Problem: Coping: Goal: Level of anxiety will decrease Outcome: Progressing   Problem: Elimination: Goal: Will not experience complications related to bowel motility Outcome: Progressing Goal: Will not experience complications related to urinary retention Outcome: Progressing   Problem: Pain Managment: Goal: General experience of comfort will improve Outcome: Progressing   Problem: Safety: Goal: Ability to remain free from injury will improve Outcome: Progressing   Problem: Skin Integrity: Goal: Risk for impaired skin integrity will decrease Outcome: Progressing   Problem: Education: Goal: 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 Outcome: Progressing   Problem: Activity: Goal: Ability to tolerate increased activity will improve Outcome: Progressing   Problem: Health Behavior/Discharge Planning: Goal: 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 Outcome: Progressing   Problem: Respiratory: Goal: Patent airway maintenance will  improve Outcome: Progressing   Problem: Role Relationship: Goal: Ability to communicate will improve Outcome: Progressing   Problem: Education: Goal: Knowledge of the prescribed therapeutic regimen Outcome: Progressing Goal: Knowledge of disease or condition will improve Outcome: Progressing   Problem: Clinical Measurements: Goal: Neurologic status will improve Outcome: Progressing   Problem: Tissue Perfusion: Goal: Ability to maintain intracranial pressure will improve Outcome: Progressing   Problem: Respiratory: Goal: Will regain and/or maintain adequate ventilation Outcome: Progressing   Problem: Skin Integrity: Goal: Risk for impaired skin integrity will decrease Outcome: Progressing Goal: Demonstration of wound healing without infection will improve Outcome: Progressing   Problem: Psychosocial: Goal: Ability to verbalize positive feelings about self will improve Outcome: Progressing Goal: Ability to participate in self-care as condition permits will improve Outcome: Progressing Goal: Ability to identify appropriate support needs will improve Outcome: Progressing   Problem: Health Behavior/Discharge Planning: Goal: Ability to manage health-related needs will improve Outcome: Progressing   Problem: Nutritional: Goal: Risk of aspiration will decrease Outcome: Progressing Goal: Dietary intake will improve Outcome: Progressing   Problem: Communication: Goal: Ability to communicate needs accurately will improve Outcome: Progressing   

## 2020-01-16 DIAGNOSIS — G8250 Quadriplegia, unspecified: Secondary | ICD-10-CM | POA: Diagnosis not present

## 2020-01-16 LAB — GLUCOSE, CAPILLARY
Glucose-Capillary: 85 mg/dL (ref 70–99)
Glucose-Capillary: 107 mg/dL — ABNORMAL HIGH (ref 70–99)
Glucose-Capillary: 114 mg/dL — ABNORMAL HIGH (ref 70–99)
Glucose-Capillary: 76 mg/dL (ref 70–99)
Glucose-Capillary: 103 mg/dL — ABNORMAL HIGH (ref 70–99)
Glucose-Capillary: 99 mg/dL (ref 70–99)

## 2020-01-16 MED ORDER — TIZANIDINE HCL 2 MG PO TABS
2.0000 mg | ORAL_TABLET | Freq: Four times a day (QID) | ORAL | Status: DC
  Administered 2020-01-16 – 2020-01-17 (×6): 2 mg
  Filled 2020-01-16 (×9): qty 1

## 2020-01-16 MED ORDER — OXYCODONE HCL 5 MG/5ML PO SOLN
5.0000 mg | Freq: Four times a day (QID) | ORAL | Status: DC
  Administered 2020-01-16 – 2020-01-23 (×26): 5 mg
  Filled 2020-01-16 (×25): qty 5

## 2020-01-16 NOTE — Progress Notes (Signed)
Orthopedic Tech Progress Note Patient Details:  Ryan Orozco 08/10/1965 [id number] Called in order to [employer] for a PAIR OF [employer]ING WHO'S Patient ID: Ryan Orozco, male   DOB: Jan 15, 1966, 54 y.o.   MRN: [id number]   Ryan Orozco 01/16/2020, 3:13 PM

## 2020-01-16 NOTE — Progress Notes (Addendum)
Physical Therapy Treatment Patient Details Name: Ryan Orozco MRN: [id number] DOB: [birth year] Today's Date: 01/16/2020    History of Present Illness 54 year old male found down outside convenience store 9/27. Pt with PEA and hypoxic brain injury. Trach 10/13, PEG 10/15. PMHx: HIV AIDS and alcohol abuse    PT Comments    Pt tearful as I sang Christmas carols to him during our session today.  He was able to follow a few basic commands to stick out his tongue and blink twice (althoug twice did not happen, he just kept blinking hard continuously until I told him to stop). He was able to relax tone in R LE in sitting enough to rest right foot flat on the floor with bed in lowest position.  I attempted to see if he would follow my lead to rock forward in sitting, WB through his legs and attempt to initiate stand after therapist initiated motion, but he did not.  He did not resist either and tone did not kick in with attempts to come forward on his feet.  At this time, lifting or two person assist for trials of OOB to chair (supervised the entire time up) would be appropriate.  For today, he was left in high chair mode with pillows to position for comfort and pressure relief.  PT will continue to follow acutely for safe mobility progression.  Goals due next session.  Follow Up Recommendations  SNF     Equipment Recommendations  Wheelchair (measurements PT);Wheelchair cushion (measurements PT);Hospital bed;Other (comment)    Recommendations for Other Services       Precautions / Restrictions Precautions Precautions: Fall Precaution Comments: trach, PEG, anoxic, flexor tone R>L LE    Mobility  Bed Mobility Overal bed mobility: Needs Assistance Bed Mobility: Rolling;Sidelying to Sit;Sit to Sidelying Rolling: Max assist Sidelying to sit: Max assist     Sit to sidelying: Max assist General bed mobility comments: Pt, after therapist initiated movement to roll, reached with hand to pull  today, started flexing trunk to sit up and reaching hand to pull to sitting, also seemingly purposefully, pushed back down to sidelying when he was ready to return to bed.  Transfers Overall transfer level: Needs assistance   Transfers: Sit to/from Stand           General transfer comment: attempted to initiate stand by shifting forward onto feet from sitting EOB.  Pt did not follow initiation to stand after several attempts, but he did not resist and tone stayed stable with attempts.  Ambulation/Gait                 Stairs             Wheelchair Mobility    Modified Rankin (Stroke Patients Only)       Balance Overall balance assessment: Needs assistance Sitting-balance support: Feet supported;No upper extremity supported Sitting balance-Leahy Scale: Zero Sitting balance - Comments: max to total assist EOB.  Sat EOB for ~20 mins and eventuallypt relaxed R LE tone enough to rest right foot on the floor.  Attempted weight shifts froward from sitting to see if pt would kick in stand, but he did not.                                    Cognition Arousal/Alertness: Awake/alert Behavior During Therapy: WFL for tasks assessed/performed Overall Cognitive Status: Impaired/Different from baseline Area of Impairment: Attention;Following commands  Current Attention Level: Sustained   Following Commands: Follows one step commands inconsistently;Follows one step commands with increased time Safety/Judgement: Decreased awareness of safety;Decreased awareness of deficits Awareness: Intellectual Problem Solving: Slow processing;Decreased initiation;Difficulty sequencing;Requires verbal cues;Requires tactile cues General Comments: Pt able to follow command to stick out tongue, blink eyes twice (then continued to blink).  At times he seems to say "yeah" appropirately.  He started crying when I sang some Christmas songs during his session.       Exercises Other Exercises Other Exercises: Continued work on command following, music therapy, trunk rotation and positioning in high chair mode at the end of the session.    General Comments        Pertinent Vitals/Pain Pain Assessment: Faces Faces Pain Scale: Hurts little more Pain Location: generalized, but most with stimulation, mobilization of R leg Pain Descriptors / Indicators: Grimacing;Moaning Pain Intervention(s): Limited activity within patient's tolerance;Monitored during session;Repositioned    Home Living                      Prior Function            PT Goals (current goals can now be found in the care plan section) Acute Rehab PT Goals Patient Stated Goal: unable to state Progress towards PT goals: Progressing toward goals    Frequency    Min 2X/week      PT Plan Current plan remains appropriate    Co-evaluation              AM-PAC PT "6 Clicks" Mobility   Outcome Measure  Help needed turning from your back to your side while in a flat bed without using bedrails?: A Lot Help needed moving from lying on your back to sitting on the side of a flat bed without using bedrails?: A Lot Help needed moving to and from a bed to a chair (including a wheelchair)?: Total Help needed standing up from a chair using your arms (e.g., wheelchair or bedside chair)?: Total Help needed to walk in hospital room?: Total Help needed climbing 3-5 steps with a railing? : Total 6 Click Score: 8    End of Session Equipment Utilized During Treatment: Oxygen (28% TC 5L O2 and HR stable throughout, more secreations today) Activity Tolerance: Patient limited by pain Patient left: in bed;with call bell/phone within reach;with bed alarm set   PT Visit Diagnosis: Other abnormalities of gait and mobility (R26.89);Muscle weakness (generalized) (M62.81)     Time: 1345-1420 PT Time Calculation (min) (ACUTE ONLY): 35 min  Charges:  $Therapeutic Activity:  23-37 mins                     Corinna Capra, PT, DPT  Acute Rehabilitation [phone] pager [phone]) [phone] office

## 2020-01-16 NOTE — Progress Notes (Addendum)
[facility] PROGRESS NOTE  Ryan Orozco OIL:[id number] DOB: 1965/08/15 DOA: 11/01/2019 PCP: Default, Provider, MD    11/16   Status: Remains inpatient appropriate because:Altered mental status, Unsafe d/c plan and Inpatient level of care appropriate due to severity of illness. Patient newly diagnosed with anoxic brain injury  Dispo: The patient is from: Home              Anticipated d/c is to: SNF              Anticipated d/c date is: > 3 days              Patient currently is medically stable to d/c.  3 years to discharge: No SNF bed offers.  Needs trach capable facility.   Code Status: Full Family Communication: 12/06 dtr [name] by telephone DVT prophylaxis: Subcutaneous heparin Vaccination status: Covid vaccination status unknown  Foley catheter: No  HPI: 54 year old male with HIV, chronic alcohol abuse who was presented to the emergency department after being found unresponsive outside a convinient store.  EMS found him pulseless in PEA, unknown downtime, successfully resuscitated and brought to the emergency department.  UDS positive for THC/benzodiazepines.  CT head unremarkable.  Failed extubation trials due to severe anoxic brain injury and had tracheostomy placed.  PEG placed on 10/15.    Significant Hospital Events   9/29 Admit post PEA arrest. UDS positive for THC, benzo's. ETOH 177 10/05 EEG ongoing. Versed restarted overnight due to agitation. Nicardipine increased.  10/06 Developed tachypnea with WUA, no follow commands off sedation  10/07 Vomiting, TF held / restarted with recurrent vomiting 10/16 tracheostomy collar for 9 hours 10/18->10/19 off vent all night  10/20> TC 11/15 > downsized to #4 Shiley flex CFS  Subjective: Awake and smiled when joke was told.  Trying to follow commands by moving upper extremities was unable to move specific extremity upon request  Objective: Vitals:   01/16/20 0508 01/16/20 0920  BP: (!) 146/97   Pulse: 81 86   Resp: 18 18  Temp: 97.8 F (36.6 C)   SpO2: 94% 96%    Intake/Output Summary (Last 24 hours) at 01/16/2020 1036 Last data filed at 01/16/2020 0156 Gross per 24 hour  Intake --  Output 700 ml  Net -700 ml   Filed Weights   01/15/20 0105 01/15/20 2117 01/16/20 0130  Weight: 71.4 kg 71.9 kg 71.9 kg    Exam: General: Alert, calm, unable to verbalize secondary to underlying neurological deficits and trach tube Pulmonary: Bilateral lung sounds coarse to auscultation with expiratory rhonchi, yellow-green secretions noted on outside of trach tube, trach collar in place to 28% with no increased work of breathing Cardiac: S1-S2, no JVD, no peripheral edema, no resting tachycardia Abdomen: Soft nontender nondistended, PEG tube in place with tube feeding infusing Neurological: Awake, unable to consistently verbalize secondary to neurological injury as well as trach tube.  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 who recommended initiating Zanaflex  as well as increasing oxycodone dose as above.   -On 12/14 [name] formally evaluated the patient with the following recommendations: Continue to titrate tizanidine with dose increased to 2 mg QID today with recommendation to continue increasing as long as vital signs and awakeness permits with in dose 4 mg QID;  to new current dose of baclofen, apply WHO UE splint devices at least at night and work up to daytime use.  Also recommended increasing frequency of oxycodone to q 6 hiurs.  In addition he is considering Botox early next week perhaps to the right hamstrings and rectus femoris noting that deep hip flexors would be difficult to isolate at bedside. -Continue low-dose Seroquel for brain injury sequela -Continue scheduled Tylenol for pain -Continue CREA muscle rub  Acute hypoxemic respiratory failure/new tracheostomy tube requirement -Hypoxemia now resolved-trach tube placed 10/3 -Continue PMV training per SLP -#4 cuffless trach-capping trial at discretion of trach team -[facility] team following.  Hesitation regarding decannulation until patient can reliably follow commands, cough and clear airway. -Noted with significant yellow-green secretions so will obtain sputum culture -Continue supervised PMV trials  -Continue Vibra vest therapy  Dysphagia 2/2 anoxic brain injury -Continue tube feeding per PEG -Repeat SLP evaluation on 11/23 with recommendation for n.p.o. status  Goals of care:  -Palliative care was involved during this hospitalization.   -Goals of care were discussed. remains full code.  - Palliative care recommended outpatient follow-up with palliative providers. -May need to revisit end-of-life goals of care since patient making poor progress and currently having significant pain related to hypertonicity from brain injury  Protein calorie malnutrition nutrition Status: Nutrition Problem: Increased nutrient needs Etiology: acute illness Signs/Symptoms: estimated needs Interventions: Tube feeding bolus doses of Osmolite, Juven twice daily, Prosource TF 45 mL 3 times daily Estimated body mass index is 25.58 kg/m as calculated from the following:   Height as of this encounter: 5' 6"  (1.676 m).   Weight as of this encounter: 71.9 kg.   Multiple decubiti not POA Wound / Incision (Open  or Dehisced) 11/17/19 Puncture Abdomen Left;Anterior;Upper G-tube insertion site  (Active)  Date First Assessed/Time First Assessed: 11/17/19 1646   Wound Type: Puncture  Location: Abdomen  Location Orientation: Left;Anterior;Upper  Wound Description (Comments): G-tube insertion site   Present on Admission: No    Assessments 11/17/2019  4:47 PM 01/14/2020  9:02 PM  Dressing Type Gauze (Comment);Tape dressing Gauze (Comment)  Dressing Changed New --  Dressing Status Clean;Dry;Intact Clean;Dry;Intact  Dressing Change Frequency PRN --  Site / Wound Assessment Clean;Dry;Pink Dressing in place / Unable to assess  Peri-wound Assessment Intact --  Closure None --  Drainage Amount None --  Treatment Cleansed --     No Linked orders to display      Other problems: Hypertension/grade 1 diastolic dysfunction:  -Continue amlodipine  -Echocardiogram this admission with preserved LVEF with evidence of grade   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   -Continue Keppra; levetiracetam level 35.9 on 11/12 -no further seizure activity since transitioned out of ICU setting therefore will discontinue seizure precaution  HIV:  -CD4 count of 124.  -Dc'd Tivicay and Descovy infavor of Biktarvy to for eventual dc to SNF -Continue prophylactic Bactrim  Inguinal hernias -Evaluated by general surgery this admission. Documented as chronically incarcerated. Patient has been clinically stable and tolerating tube feedings and having bowel movements so unless patient obstructed would not be considered a candidate for repair. Even if obstructed consulting surgeon was not sure he  would be a candidate for hernia repair regardless .   Data Reviewed: Basic Metabolic Panel: Recent Labs  Lab 01/13/20 0141 01/15/20 0924  NA 143 144  K 3.7 3.9  CL 108 105  CO2 23 27  GLUCOSE 98 98  BUN 19 14  CREATININE 0.79 0.76  CALCIUM 9.8 10.1   Liver Function Tests: Recent Labs  Lab  01/15/20 0924  AST 29  ALT 24  ALKPHOS 104  BILITOT 0.6  PROT 7.3  ALBUMIN 3.2*   No results for input(s): LIPASE, AMYLASE in the last 168 hours. No results for input(s): AMMONIA in the last 168 hours. CBC: Recent Labs  Lab 01/13/20 0141 01/15/20 0924  WBC 4.3 3.0*  HGB 14.1 14.6  HCT 45.1 44.4  MCV 98.7 95.3  PLT 154 162   Cardiac Enzymes: No results for input(s): CKTOTAL, CKMB, CKMBINDEX, TROPONINI in the last 168 hours. BNP (last 3 results) No results for input(s): BNP in the last 8760 hours.  ProBNP (last 3 results) No results for input(s): PROBNP in the last 8760 hours.  CBG: Recent Labs  Lab 01/15/20 1625 01/15/20 1934 01/15/20 2300 01/16/20 0329 01/16/20 0738  GLUCAP 91 124* 135* 85 107*    No results found for this or any previous visit (from the past 240 hour(s)).   Studies: No results found.  Scheduled Meds: . acetaminophen (TYLENOL) oral liquid 160 mg/5 mL  650 mg Per Tube Q6H  . amLODipine  10 mg Per Tube Daily  . baclofen  30 mg Per Tube TID  . chlorhexidine gluconate (MEDLINE KIT)  15 mL Mouth Rinse BID  . Darunavir-Cobicisctat-Emtricitabine-Tenofovir Alafenamide  1 tablet Oral Q breakfast  . famotidine  10.4 mg Per Tube BID  . feeding supplement (OSMOLITE 1.5 CAL)  237 mL Per Tube Q24H  . feeding supplement (OSMOLITE 1.5 CAL)  474 mL Per Tube TID  . feeding supplement (PROSource TF)  45 mL Per Tube Daily  . fiber  1 packet Per Tube BID  . folic acid  1 mg Per Tube Daily  . free water  200 mL Per Tube Q4H  . gabapentin  200 mg Per Tube Q8H  . heparin injection (subcutaneous)  5,000 Units Subcutaneous Q8H  . ibuprofen  600 mg Per Tube Q6H  . levETIRAcetam  1,500 mg Per Tube BID  . Muscle Rub   Topical QID  . nystatin   Topical TID  . oxyCODONE  5 mg Per Tube Q8H  . QUEtiapine  12.5 mg Per Tube QHS  . scopolamine  1 patch Transdermal Q72H  . sulfamethoxazole-trimethoprim  1 tablet Per Tube Once per day on Mon Wed Fri  . thiamine  100 mg  Per Tube Daily  . tiZANidine  2 mg Per Tube TID   Continuous Infusions:   Active Problems:   Cardiac arrest [facility])   Community acquired pneumonia of left upper lobe of lung   Anoxic brain injury ([place])   Alcohol abuse   Acute respiratory failure with hypoxia ([facility])   Vomiting   Pressure injury of skin   Small bowel obstruction ([facility])   Palliative care encounter   Bowel obstruction ([facility])   Chronic respiratory failure (H[facility])   Diastolic dysfunction   Tracheostomy dependent (HCC)   Dysphagia   Protein-calorie malnutrition (HCC)   Seizures ([place])   Bilateral recurrent inguinal hernia   Muscle spasticity   Consultants:  PCCM  Palliative medicine  General surgery  Neurology  Interventional radiology  Procedures:  EEG  Echocardiogram  Tracheostomy  Antibiotics: Anti-infectives (From admission, onward)   Start     Dose/Rate Route Frequency Ordered Stop   12/19/19 0930  Darunavir-Cobicisctat-Emtricitabine-Tenofovir Alafenamide (SYMTUZA) 800-150-200-10 MG TABS 1 tablet        1 tablet Oral Daily with breakfast 12/19/19 0840     12/13/19 1000  bictegravir-emtricitabine-tenofovir AF (BIKTARVY) 50-200-25 MG per tablet 1 tablet  Status:  Discontinued        1 tablet Oral Daily 12/12/19 1413 12/19/19 0840   11/17/19 1548  ceFAZolin (ANCEF) 2-4 GM/100ML-% IVPB       Note to Pharmacy: Domenick Bookbinder   : cabinet override      11/17/19 1548 11/18/19 0359   11/16/19 1515  ceFAZolin (ANCEF) IVPB 2g/100 mL premix        2 g 200 mL/hr over 30 Minutes Intravenous To Radiology 11/16/19 1511 11/17/19 1625   11/15/19 0900  sulfamethoxazole-trimethoprim (BACTRIM DS) 800-160 MG per tablet 1 tablet        1 tablet Per Tube Once per day on Mon Wed Fri 11/13/19 1906     11/13/19 1615  dolutegravir (TIVICAY) tablet 50 mg  Status:  Discontinued        50 mg Oral Daily 11/13/19 1521 12/12/19 1413   11/13/19 1615  emtricitabine-tenofovir AF (DESCOVY) 200-25 MG per tablet 1 tablet  Status:   Discontinued        1 tablet Per Tube Daily 11/13/19 1521 12/12/19 1413   11/13/19 1530  sulfamethoxazole-trimethoprim (BACTRIM DS) 800-160 MG per tablet 1 tablet  Status:  Discontinued        1 tablet Oral Once per day on Mon Wed Fri 11/13/19 1441 11/13/19 1906   11/13/19 1500  bictegravir-emtricitabine-tenofovir AF (BIKTARVY) 50-200-25 MG per tablet 1 tablet  Status:  Discontinued        1 tablet Oral Daily 11/13/19 1403 11/13/19 1521   11/10/19 1000  erythromycin 250 mg in sodium chloride 0.9 % 100 mL IVPB        250 mg 100 mL/hr over 60 Minutes Intravenous Every 8 hours 11/10/19 0907 11/11/19 2000   11/07/19 1200  ceFEPIme (MAXIPIME) 2 g in sodium chloride 0.9 % 100 mL IVPB        2 g 200 mL/hr over 30 Minutes Intravenous Every 8 hours 11/07/19 1013 11/13/19 2127   11/03/19 1200  cefTRIAXone (ROCEPHIN) 2 g in sodium chloride 0.9 % 100 mL IVPB        2 g 200 mL/hr over 30 Minutes Intravenous Every 24 hours 11/03/19 0922 11/05/19 1427   11/02/19 0800  vancomycin (VANCOREADY) IVPB 750 mg/150 mL  Status:  Discontinued        750 mg 150 mL/hr over 60 Minutes Intravenous Every 12 hours 11/01/19 1935 11/02/19 1105   11/02/19 0600  piperacillin-tazobactam (ZOSYN) IVPB 3.375 g  Status:  Discontinued        3.375 g 12.5 mL/hr over 240 Minutes Intravenous Every 8 hours 11/02/19 0311 11/03/19 0920   11/01/19 2200  ceFEPIme (MAXIPIME) 2 g in sodium chloride 0.9 % 100 mL IVPB  Status:  Discontinued        2 g 200 mL/hr over 30 Minutes Intravenous Every 12 hours 11/01/19 2143 11/02/19 0311   11/01/19 1915  vancomycin (VANCOREADY) IVPB 1500 mg/300 mL        1,500 mg 150 mL/hr over 120 Minutes Intravenous  Once 11/01/19 1909 11/01/19 2147   11/01/19 1845  cefTRIAXone (ROCEPHIN) 1 g in sodium chloride 0.9 %  100 mL IVPB        1 g 200 mL/hr over 30 Minutes Intravenous  Once 11/01/19 1842 11/01/19 2051   11/01/19 1845  azithromycin (ZITHROMAX) 500 mg in sodium chloride 0.9 % 250 mL IVPB        500  mg 250 mL/hr over 60 Minutes Intravenous  Once 11/01/19 1842 11/01/19 2051       Time spent: 20 minutes    Erin Hearing ANP  [facility]  Pager [phone].  01/16/2020, 10:36 AM  LOS: 76 days

## 2020-01-16 NOTE — Consult Note (Signed)
Physical Medicine and Rehabilitation Consult Reason for Consult:Spasticity Referring Physician: Rennis Harding, NP   HPI: Ryan Orozco is a 54 y.o. male who was found down outside a convenience store on  10/30/19. Upon arrival in the ED was found to be in PEA. He was coded and resuscitated but sustained significant anoxic brain injury. UDS + for THC, benzos, ETOH 177. MRI's 10/3 and 10/11 notable for hyperintensity in bilateral caudate and putamen as well as cerebellum and cortex diffusely. EEG + for seizures. Pt was placed on ventilator and trached 10/3. Janina Mayo remains in place. He remains severely impaired from a spasticity standpoint, and as far as his tone is concerned, he has continued to demonstrate significant hypertonicity despite trial of baclofen which was titrated up to 30mg  TID.  I spoke with NP last week regarding tone management and suggested a trial of tizanidine which was started and increased to 2mg  TID. He seems to be tolerating it well. He's also receiving scheduled oxycodone 5mg  q8 for pain control   Review of Systems  Unable to perform ROS: Mental acuity   Past Medical History:  Diagnosis Date  . Alcohol abuse   . HIV (human immunodeficiency virus infection) (HCC) 1995   Past Surgical History:  Procedure Laterality Date  . IR GASTROSTOMY TUBE MOD SED  11/17/2019  . NO PAST SURGERIES     Family History  Problem Relation Age of Onset  . Hypertension Mother   . Throat cancer Mother   . Lung cancer Father   . Cancer Maternal Grandfather    Social History:  reports that he has never smoked. He has never used smokeless tobacco. He reports current alcohol use. He reports that he does not use drugs. Allergies:  Allergies  Allergen Reactions  . Tomato Hives   Medications Prior to Admission  Medication Sig Dispense Refill  . bictegravir-emtricitabine-tenofovir AF (BIKTARVY) 50-200-25 MG TABS tablet Take 1 tablet by mouth daily. (Patient not taking: Reported on  11/01/2019) 30 tablet 5  . erythromycin ophthalmic ointment Place a 1/2 inch ribbon of ointment into the lower eyelid. (Patient not taking: Reported on 02/18/2018) 1 g 0  . HYDROcodone-acetaminophen (NORCO/VICODIN) 5-325 MG per tablet Take 1-2 tablets by mouth every 6 (six) hours as needed for pain. (Patient not taking: Reported on 02/18/2018) 17 tablet 0  . oxyCODONE-acetaminophen (PERCOCET/ROXICET) 5-325 MG tablet Take 1 tablet by mouth every 4 (four) hours as needed for severe pain. (Patient not taking: Reported on 02/18/2018) 10 tablet 0  . potassium chloride SA (KLOR-CON) 20 MEQ tablet Take 1 tablet (20 mEq total) by mouth daily. (Patient not taking: Reported on 11/01/2019) 3 tablet 0    Home: Home Living Family/patient expects to be discharged to:: Skilled nursing facility Additional Comments: pt was independent, living with brother who has since passed during this admission  Functional History: Prior Function Level of Independence: Independent Comments: per chart review, pt was independent  Functional Status:  Mobility: Bed Mobility Overal bed mobility: Needs Assistance Bed Mobility: Supine to Sit,Sit to Supine Rolling: Total assist Sidelying to sit: Total assist Supine to sit: Total assist Sit to supine: Total assist Sit to sidelying: Total assist General bed mobility comments: Used flexor tone to help bring him to EOB today.  Trunkal tone and UE tone seem better as MDs and NP is adjusting his baclofen.  R LE remains the same. Transfers Overall transfer level: Needs assistance Transfers: Sit to/from Stand Sit to Stand: Total assist,+2 physical assistance,From elevated surface General  transfer comment: I would acutally like to try soon to get him to a WC or recliner chair, but will have to be seat belted in for safety and closely monitored. Would have to go back to bed (not safe to be left in the chair, even with a safety belt in place). Ambulation/Gait General Gait Details:  unable    ADL: ADL Overall ADL's : Needs assistance/impaired Grooming: Wash/dry face,Bed level,Total assistance Grooming Details (indicate cue type and reason): hand over hand assist provided General ADL Comments: Pt requires total A for all aspects. Pt incontinent urine and large loose stool.  He was assited with peri care in supine   Cognition: Cognition Overall Cognitive Status: Impaired/Different from baseline Arousal/Alertness: Lethargic Orientation Level: Intubated/Tracheostomy - Unable to assess Attention: Focused Focused Attention: Impaired Focused Attention Impairment: Functional basic,Verbal basic Ranch[place]ant Scales of Cognitive Functioning: Localized response Cognition Arousal/Alertness: Awake/alert Behavior During Therapy: WFL for tasks assessed/performed Overall Cognitive Status: Impaired/Different from baseline Area of Impairment: Attention,Following commands Orientation Level: Disoriented to,Place,Time,Situation Current Attention Level: Sustained Memory: Decreased short-term memory Following Commands: Follows one step commands inconsistently,Follows one step commands with increased time Safety/Judgement: Decreased awareness of safety,Decreased awareness of deficits Awareness: Intellectual Problem Solving: Slow processing,Decreased initiation,Difficulty sequencing,Requires verbal cues,Requires tactile cues General Comments: Pt able to consistently follow commands with a delay.  He responded "Hi"  when I said "hey Khian" and was able to say "yes" in response to a question  Blood pressure (!) 146/97, pulse 84, temperature 97.8 F (36.6 C), temperature source Axillary, resp. rate 18, height 5\' 6"  (1.676 m), weight 71.9 kg, SpO2 97 %. Physical Exam Constitutional:      Comments: Pt lying in bed. Appears restless. Fairly alert.   HENT:     Head: Normocephalic.     Nose: Nose normal.  Neck:     Comments: Trach in place, #4 shiley cuffless Cardiovascular:      Rate and Rhythm: Normal rate.  Pulmonary:     Breath sounds: Rhonchi present.  Abdominal:     General: There is no distension.     Palpations: Abdomen is soft.     Comments: PEG in place, sl tender with palpation  Musculoskeletal:     Comments: Right hip, hamstring, left elbow/wrist bordering on contractures  Skin:    General: Skin is warm.  Neurological:     Comments: Pt is alert. Does not consistently follow commands. Very restless. Moves left leg and arms somewhat but not consistently. Does sense pain in all 4's. Spasticity scores (Modified Ashworth): RUE: bicep, wrist pronators and finger/wrist flexors 2/4. LUE: bicep/brachioradialis, wrist and finger flexors 2-3/4. RLE flexed at hip and knee 120 degrees each at rest. Hip flexors (rectus femoris appears to be most prominent, perhaps iliopsoas), hamstrings 3/4, gastrocs difficult to truly assess with knee hyperflexed. LLE more of extensor pattern, left heel cord tight. DTR's 3+  Psychiatric:     Comments: Restless, distracted     Results for orders placed or performed during the hospital encounter of 11/01/19 (from the past 24 hour(s))  Glucose, capillary     Status: None   Collection Time: 01/15/20  4:25 PM  Result Value Ref Range   Glucose-Capillary 91 70 - 99 mg/dL  Glucose, capillary     Status: Abnormal   Collection Time: 01/15/20  7:34 PM  Result Value Ref Range   Glucose-Capillary 124 (H) 70 - 99 mg/dL  Glucose, capillary     Status: Abnormal   Collection Time: 01/15/20 11:00  PM  Result Value Ref Range   Glucose-Capillary 135 (H) 70 - 99 mg/dL  Glucose, capillary     Status: None   Collection Time: 01/16/20  3:29 AM  Result Value Ref Range   Glucose-Capillary 85 70 - 99 mg/dL  Glucose, capillary     Status: Abnormal   Collection Time: 01/16/20  7:38 AM  Result Value Ref Range   Glucose-Capillary 107 (H) 70 - 99 mg/dL  Culture, respiratory (non-expectorated)     Status: None (Preliminary result)   Collection Time:  01/16/20  9:30 AM   Specimen: Tracheal Aspirate; Respiratory  Result Value Ref Range   Specimen Description TRACHEAL ASPIRATE    Special Requests NONE    Gram Stain      MODERATE WBC PRESENT,BOTH PMN AND MONONUCLEAR MODERATE GRAM POSITIVE RODS FEW GRAM NEGATIVE RODS Performed at [facility], [address], [place], [place] [address]    Culture PENDING    Report Status PENDING   Glucose, capillary     Status: Abnormal   Collection Time: 01/16/20 12:27 PM  Result Value Ref Range   Glucose-Capillary 114 (H) 70 - 99 mg/dL   No results found.  Assessment/Plan:  1. Unfortunate 54yo male who went into PEA on 10/30/19 and suffered anoxic brain injury. 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   -continue to titrate tizanidine. I have already increased to 2mg  qid today. You can continue increasing arousal/bp permitting by 2mg  ever 2-3 days up to 4mg  qid in the short term  -continue baclofen  -PT, ROM as possible  -I have ordered bilateral WHO's for UE's. Right now these should be worn   at least at night but also as tolerated during the day. His wrists are pretty tight, so it might be difficult to don these initially  -would consider botox early next week, perhaps to right hamstrings and rectus femoris, deep hip flexors would be difficult to isolate at bedside  -I have increased oxycodone to q6 hours scheduled for more consistent pain control.   I'll follow up at the beginning of next week.    Thanks,  , MD, [facility] Physical Medicine & Rehabilitation 01/16/2020     [facility], MD 01/16/2020

## 2020-01-17 LAB — GLUCOSE, CAPILLARY
Glucose-Capillary: 67 mg/dL — ABNORMAL LOW (ref 70–99)
Glucose-Capillary: 78 mg/dL (ref 70–99)
Glucose-Capillary: 89 mg/dL (ref 70–99)
Glucose-Capillary: 92 mg/dL (ref 70–99)
Glucose-Capillary: 107 mg/dL — ABNORMAL HIGH (ref 70–99)
Glucose-Capillary: 153 mg/dL — ABNORMAL HIGH (ref 70–99)
Glucose-Capillary: <10 mg/dL — CL (ref 70–99)
Glucose-Capillary: 82 mg/dL (ref 70–99)

## 2020-01-17 NOTE — Progress Notes (Signed)
  Speech Language Pathology Treatment: Cognitive-Linquistic;Passy Muir Speaking valve  Patient Details Name: Ryan Orozco MRN: [id number] DOB: 09-17-1965 Today's Date: 01/17/2020 Time: 6063-0160 SLP Time Calculation (min) (ACUTE ONLY): 23 min  Assessment / Plan / Recommendation Clinical Impression  Pt was seen for treatment; he was was alert and cooperative throughout the session. Pt demonstrated vocalization prior to PMSV placement. He tolerated PMSV for 20 minutes with stable vitals and no respiratory distress and it was removed at the end of the session. He was stimulable for production of /h/ and produced /h/ (CV) words with 50% accuracy with visual, verbal, and orthographic cues increasing to 67% with repetition for accuracy. He responded to simple yes/no questions with 50% accuracy when orthographic cues were given. Given models and orthographic stimuli, pt demonstrated approximation of words during a confrontational naming task with 60% accuracy increasing to 70% with additional models. Pt's approximations were production of the initial phoneme or initial syllable of words with reduced articulatory precision. SLP will continue to follow pt.    HPI HPI: 54 y/o male with hx of HIV, ETOH abuse found down outside a convenience store on 9/27, received out of hospital CPR. Presents with acute hypoxic respiratory failure s/p cardiac arrest, acute encephalopathy due to hypoxic brain injury. Intubated 9/27-10/13. Received a tracheostomy 10/13, treachestomy collar trial for 9 hours on 10/16. CXR on 10/17 improving and shows left lung opacities have essentially resolved      SLP Plan  Continue with current plan of care       Recommendations  Diet recommendations: NPO Medication Administration: Via alternative means      Patient may use Passy-Muir Speech Valve: During all therapies with supervision PMSV Supervision: Full         Oral Care Recommendations: Oral care QID Follow up  Recommendations: Skilled Nursing facility SLP Visit Diagnosis: Cognitive communication deficit (R41.841);Aphonia (R49.1) Plan: Continue with current plan of care       Ryan Orozco I. Vear Clock, MS, CCC-SLP Acute Rehabilitation Services Office number [phone] Pager [phone]                Ryan Orozco 01/17/2020, 2:23 PM

## 2020-01-17 NOTE — Progress Notes (Addendum)
[facility] PROGRESS NOTE  Malon [place] FGH:[id number] DOB: Sep 25, 1965 DOA: 11/01/2019 PCP: Default, Provider, MD    11/16   Status: Remains inpatient appropriate because:Altered mental status, Unsafe d/c plan and Inpatient level of care appropriate due to severity of illness. Patient newly diagnosed with anoxic brain injury  Dispo: The patient is from: Home              Anticipated d/c is to: SNF              Anticipated d/c date is: > 3 days              Patient currently is medically stable to d/c.  3 years to discharge: No SNF bed offers.  Needs trach capable facility.   Code Status: Full Family Communication: 12/15 dtr [name] by telephone DVT prophylaxis: Subcutaneous heparin Vaccination status: Covid vaccination status unknown  Foley catheter: No  HPI: 54 year old male with HIV, chronic alcohol abuse who was presented to the emergency department after being found unresponsive outside a convinient store.  EMS found him pulseless in PEA, unknown downtime, successfully resuscitated and brought to the emergency department.  UDS positive for THC/benzodiazepines.  CT head unremarkable.  Failed extubation trials due to severe anoxic brain injury and had tracheostomy placed.  PEG placed on 10/15.  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  This has led to significant pain as well.  [name] was consulted and recommendations/input/orders highly appreciated.  She may also benefit from Botox injections to both hips to aid in treatment of spasticity.   Significant Hospital Events   9/29 Admit post PEA arrest. UDS positive for THC, benzo's. ETOH 177 10/05 EEG ongoing. Versed restarted overnight due to agitation. Nicardipine increased.  10/06 Developed tachypnea with WUA, no follow commands off sedation  10/07 Vomiting, TF held / restarted with recurrent vomiting 10/16 tracheostomy collar for 9  hours 10/18->10/19 off vent all night  10/20> TC 11/15 > downsized to #4 Shiley flex CFS  Subjective: Awake.  Seem to be trying to sit up in the bed.  Nodded yes when I asked him if he wanted me to put the head of the bed up.  Head of bed elevated almost 90 degrees.  Patient appears more comfortable and is watching television now  Objective: Vitals:   01/17/20 0802 01/17/20 1148  BP:    Pulse: 85 84  Resp: 18 18  Temp:    SpO2: 100% 100%    Intake/Output Summary (Last 24 hours) at 01/17/2020 1217 Last data filed at 01/17/2020 6789 Gross per 24 hour  Intake --  Output 450 ml  Net -450 ml   Filed Weights   01/15/20 0105 01/15/20 2117 01/16/20 0130  Weight: 71.4 kg 71.9 kg 71.9 kg    Exam: General: Alert, slightly restless initially improved after head of bed elevated Pulmonary: Bilateral lung sounds coarse to auscultation with expiratory rhonchi, creased amount of yellow-green secretions noted on outside of trach tube, trach collar in place to 28% with normal respiratory effort Cardiac: S1-S2, no JVD, no peripheral edema, no resting tachycardia Abdomen: Soft nontender nondistended, PEG tube in place with tube feeding infusing Neurological: Awake, unable to consistently verbalize secondary to neurological injury as well as trach tube.  Appeared to nod appropriately to questions asked if simple yes or no.  Improved upper extremity hypertonicity and dystonic movements with purposeful movements -continues to exhibit hypertonicity of lower extremities resulting in persistent flexion at  hips R>L   Assessment/Plan: Acute problems: Anoxic brain injury 2/2 PEA arrest/pain syndrome secondary to hypertonicity/spastic tetraplegia:  -Presumed 2/2 combination alcohol and BZD overdose with UDS positive for THC and BZDs -Do not expect he will recover fully - long-term SNF placement recommended -Continue PT/OT/SLP -Continue baclofen 30 mg TID along with gabapentin, Zanaflex titration as  below scheduled ibuprofen (x 7 days only) and scheduled oxycodone 5 mg TID -Discussed with [name] who recommended initiating Zanaflex as well as increasing oxycodone dose as above.   -On 12/14 [name] formally evaluated the patient with the following recommendations: Continue to titrate tizanidine with dose increased to 2 mg QID today with recommendation to continue increasing as long as vital signs and awakeness permits with in dose 4 mg QID; to new current dose of baclofen, apply WHO UE splint devices at least at night and work up to daytime use.  Also recommended increasing frequency of oxycodone to q 6 hiurs.  In addition he is considering Botox early next week perhaps to the right hamstrings and rectus femoris noting that deep hip flexors would be difficult to isolate at bedside. -Continue low-dose Seroquel for brain injury sequela -Continue scheduled Tylenol for pain -Continue CREA muscle rub  Acute hypoxemic respiratory failure/new tracheostomy tube requirement -Hypoxemia now resolved-trach tube placed 10/3 -Continue PMV training per SLP -#4 cuffless trach-capping trial at discretion of trach team -[place] team following.  Hesitation regarding decannulation until patient can reliably follow commands, cough and clear airway. -Sputum culture obtained on 12/14 with moderate gram-positive rods and few gram-negative rods culture-no fever but given degree of congestion if culture positive may need to treat as acute tracheobronchitis -continue physiotherapy with Vibra vest -Continue supervised PMV trials  -Obtain KREG bed  Dysphagia 2/2 anoxic brain injury -Continue tube feeding per PEG -Repeat SLP evaluation on 11/23 with recommendation for n.p.o. status  Goals of care:  -Palliative care was involved during this hospitalization.   -Goals of care were discussed. remains full code.  - Palliative care recommended outpatient follow-up with palliative providers. -May need to revisit  end-of-life goals of care since patient making poor progress and currently having significant pain related to hypertonicity from brain injury  Protein calorie malnutrition nutrition Status: Nutrition Problem: Increased nutrient needs Etiology: acute illness Signs/Symptoms: estimated needs Interventions: Tube feeding bolus doses of Osmolite, Juven twice daily, Prosource TF 45 mL 3 times daily Estimated body mass index is 25.58 kg/m as calculated from the following:   Height as of this encounter: 5' 6"  (1.676 m).   Weight as of this encounter: 71.9 kg.   Multiple decubiti not POA Wound / Incision (Open or Dehisced) 11/17/19 Puncture Abdomen Left;Anterior;Upper G-tube insertion site  (Active)  Date First Assessed/Time First Assessed: 11/17/19 1646   Wound Type: Puncture  Location: Abdomen  Location Orientation: Left;Anterior;Upper  Wound Description (Comments): G-tube insertion site   Present on Admission: No    Assessments 11/17/2019  4:47 PM 01/17/2020 10:00 AM  Dressing Type Gauze (Comment);Tape dressing --  Dressing Changed New --  Dressing Status Clean;Dry;Intact --  Dressing Change Frequency PRN --  Site / Wound Assessment Clean;Dry;Pink --  Peri-wound Assessment Intact --  Wound Length (cm) -- 0 cm  Closure None --  Drainage Amount None --  Treatment Cleansed --     No Linked orders to display      Other problems: Hypertension/grade 1 diastolic dysfunction:  -Continue amlodipine  -Echocardiogram this admission with preserved LVEF with evidence of grade   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   -Continue Keppra; levetiracetam level 35.9 on 11/12 -no further seizure activity since transitioned out of ICU setting therefore will discontinue seizure precaution  HIV:  -CD4 count of 124.  -Dc'd Tivicay and Descovy infavor of Biktarvy to for eventual dc to SNF -Continue prophylactic Bactrim  Inguinal hernias -Evaluated by general surgery this  admission. Documented as chronically incarcerated. Patient has been clinically stable and tolerating tube feedings and having bowel movements so unless patient obstructed would not be considered a candidate for repair. Even if obstructed consulting surgeon was not sure he would be a candidate for hernia repair regardless .   Data Reviewed: Basic Metabolic Panel: Recent Labs  Lab 01/13/20 0141 01/15/20 0924  NA 143 144  K 3.7 3.9  CL 108 105  CO2 23 27  GLUCOSE 98 98  BUN 19 14  CREATININE 0.79 0.76  CALCIUM 9.8 10.1   Liver Function Tests: Recent Labs  Lab 01/15/20 0924  AST 29  ALT 24  ALKPHOS 104  BILITOT 0.6  PROT 7.3  ALBUMIN 3.2*   No results for input(s): LIPASE, AMYLASE in the last 168 hours. No results for input(s): AMMONIA in the last 168 hours. CBC: Recent Labs  Lab 01/13/20 0141 01/15/20 0924  WBC 4.3 3.0*  HGB 14.1 14.6  HCT 45.1 44.4  MCV 98.7 95.3  PLT 154 162   Cardiac Enzymes: No results for input(s): CKTOTAL, CKMB, CKMBINDEX, TROPONINI in the last 168 hours. BNP (last 3 results) No results for input(s): BNP in the last 8760 hours.  ProBNP (last 3 results) No results for input(s): PROBNP in the last 8760 hours.  CBG: Recent Labs  Lab 01/16/20 2027 01/16/20 2315 01/17/20 0337 01/17/20 0538 01/17/20 0749  GLUCAP 103* 99 67* 78 89    Recent Results (from the past 240 hour(s))  Culture, respiratory (non-expectorated)     Status: None (Preliminary result)   Collection Time: 01/16/20  9:30 AM   Specimen: Tracheal Aspirate; Respiratory  Result Value Ref Range Status   Specimen Description TRACHEAL ASPIRATE  Final   Special Requests NONE  Final   Gram Stain   Final    MODERATE WBC PRESENT,BOTH PMN AND MONONUCLEAR MODERATE GRAM POSITIVE RODS FEW GRAM NEGATIVE RODS    Culture   Final    CULTURE REINCUBATED FOR BETTER GROWTH Performed at [facility], [place] [address], [place], [place] [address]    Report Status PENDING   Incomplete     Studies: No results found.  Scheduled Meds: . acetaminophen (TYLENOL) oral liquid 160 mg/5 mL  650 mg Per Tube Q6H  . amLODipine  10 mg Per Tube Daily  . baclofen  30 mg Per Tube TID  . chlorhexidine gluconate (MEDLINE KIT)  15 mL Mouth Rinse BID  . Darunavir-Cobicisctat-Emtricitabine-Tenofovir Alafenamide  1 tablet Oral Q breakfast  . famotidine  10.4 mg Per Tube BID  . feeding supplement (OSMOLITE 1.5 CAL)  237 mL Per Tube Q24H  . feeding supplement (OSMOLITE 1.5 CAL)  474 mL Per Tube TID  . feeding supplement (PROSource TF)  45 mL Per Tube Daily  . fiber  1 packet Per Tube BID  . folic acid  1 mg Per Tube Daily  . free water  200 mL Per Tube Q4H  . gabapentin  200 mg Per Tube Q8H  . heparin injection (subcutaneous)  5,000 Units Subcutaneous Q8H  . ibuprofen  600 mg Per Tube Q6H  . levETIRAcetam  1,500 mg Per Tube BID  . Muscle Rub   Topical QID  . nystatin   Topical TID  . oxyCODONE  5 mg Per Tube Q6H  . QUEtiapine  12.5 mg Per Tube QHS  . scopolamine  1 patch Transdermal Q72H  . sulfamethoxazole-trimethoprim  1 tablet Per Tube Once per day on Mon Wed Fri  . thiamine  100 mg Per Tube Daily  . tiZANidine  2 mg Per Tube QID   Continuous Infusions:   Active Problems:   Cardiac arrest [facility])   Community acquired pneumonia of left upper lobe of lung   Anoxic brain injury ([place])   Alcohol abuse   Acute respiratory failure with hypoxia ([facility])   Vomiting   Pressure injury of skin   Small bowel obstruction ([place])   Palliative care encounter   Bowel obstruction ([facility])   Chronic respiratory failure ([facility])   Diastolic dysfunction   Tracheostomy dependent ([place])   Dysphagia   Protein-calorie malnutrition ([place])   Seizures ([place])   Bilateral recurrent inguinal hernia   Muscle spasticity   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[place])   Consultants:  PCCM  Palliative medicine  General surgery  Neurology  Interventional  radiology  Procedures:  EEG  Echocardiogram  Tracheostomy  Antibiotics: Anti-infectives (From admission, onward)   Start     Dose/Rate Route Frequency Ordered Stop   12/19/19 0930  Darunavir-Cobicisctat-Emtricitabine-Tenofovir Alafenamide (SYMTUZA) 800-150-200-10 MG TABS 1 tablet        1 tablet Oral Daily with breakfast 12/19/19 0840     12/13/19 1000  bictegravir-emtricitabine-tenofovir AF (BIKTARVY) 50-200-25 MG per tablet 1 tablet  Status:  Discontinued        1 tablet Oral Daily 12/12/19 1413 12/19/19 0840   11/17/19 1548  ceFAZolin (ANCEF) 2-4 GM/100ML-% IVPB       Note to Pharmacy: Domenick Bookbinder   : cabinet override      11/17/19 1548 11/18/19 0359   11/16/19 1515  ceFAZolin (ANCEF) IVPB 2g/100 mL premix        2 g 200 mL/hr over 30 Minutes Intravenous To Radiology 11/16/19 1511 11/17/19 1625   11/15/19 0900  sulfamethoxazole-trimethoprim (BACTRIM DS) 800-160 MG per tablet 1 tablet        1 tablet Per Tube Once per day on Mon Wed Fri 11/13/19 1906     11/13/19 1615  dolutegravir (TIVICAY) tablet 50 mg  Status:  Discontinued        50 mg Oral Daily 11/13/19 1521 12/12/19 1413   11/13/19 1615  emtricitabine-tenofovir AF (DESCOVY) 200-25 MG per tablet 1 tablet  Status:  Discontinued        1 tablet Per Tube Daily 11/13/19 1521 12/12/19 1413   11/13/19 1530  sulfamethoxazole-trimethoprim (BACTRIM DS) 800-160 MG per tablet 1 tablet  Status:  Discontinued        1 tablet Oral Once per day on Mon Wed Fri 11/13/19 1441 11/13/19 1906   11/13/19 1500  bictegravir-emtricitabine-tenofovir AF (BIKTARVY) 50-200-25 MG per tablet 1 tablet  Status:  Discontinued        1 tablet Oral Daily 11/13/19 1403 11/13/19 1521   11/10/19 1000  erythromycin 250 mg in sodium chloride 0.9 % 100 mL IVPB        250 mg 100 mL/hr over 60 Minutes Intravenous Every 8 hours 11/10/19 0907 11/11/19 2000   11/07/19 1200  ceFEPIme (MAXIPIME) 2 g in sodium chloride 0.9 % 100 mL IVPB        2 g 200  mL/hr over 30  Minutes Intravenous Every 8 hours 11/07/19 1013 11/13/19 2127   11/03/19 1200  cefTRIAXone (ROCEPHIN) 2 g in sodium chloride 0.9 % 100 mL IVPB        2 g 200 mL/hr over 30 Minutes Intravenous Every 24 hours 11/03/19 0922 11/05/19 1427   11/02/19 0800  vancomycin (VANCOREADY) IVPB 750 mg/150 mL  Status:  Discontinued        750 mg 150 mL/hr over 60 Minutes Intravenous Every 12 hours 11/01/19 1935 11/02/19 1105   11/02/19 0600  piperacillin-tazobactam (ZOSYN) IVPB 3.375 g  Status:  Discontinued        3.375 g 12.5 mL/hr over 240 Minutes Intravenous Every 8 hours 11/02/19 0311 11/03/19 0920   11/01/19 2200  ceFEPIme (MAXIPIME) 2 g in sodium chloride 0.9 % 100 mL IVPB  Status:  Discontinued        2 g 200 mL/hr over 30 Minutes Intravenous Every 12 hours 11/01/19 2143 11/02/19 0311   11/01/19 1915  vancomycin (VANCOREADY) IVPB 1500 mg/300 mL        1,500 mg 150 mL/hr over 120 Minutes Intravenous  Once 11/01/19 1909 11/01/19 2147   11/01/19 1845  cefTRIAXone (ROCEPHIN) 1 g in sodium chloride 0.9 % 100 mL IVPB        1 g 200 mL/hr over 30 Minutes Intravenous  Once 11/01/19 1842 11/01/19 2051   11/01/19 1845  azithromycin (ZITHROMAX) 500 mg in sodium chloride 0.9 % 250 mL IVPB        500 mg 250 mL/hr over 60 Minutes Intravenous  Once 11/01/19 1842 11/01/19 2051       Time spent: 20 minutes    Erin Hearing ANP  [facility]  Pager [phone].  01/17/2020, 12:17 PM  LOS: 77 days

## 2020-01-17 NOTE — Progress Notes (Signed)
Occupational Therapy Treatment Patient Details Name: Ryan Orozco MRN: [id number] DOB: [birth year] Today's Date: 01/17/2020    History of present illness 54 year old male found down outside convenience store 9/27. Pt with PEA and hypoxic brain injury. Trach 10/13, PEG 10/15. PMHx: HIV AIDS and alcohol abuse   OT comments  Pt with increased flexor spasticity initially  this date compared to last session.  He required total A to move to EOB, and repeatedly threw himself back toward the bed despite max encouragement to sit EOB.  He was returned to supine, and was able to assist with rolling - max A, and was moved into partial chair position with a noticeable reduction in hip spasticity.  He was able to follow one step motor commands ~25% of the time today with a delay.  He appeared fatigued.  He is moving Lt UE into some elbow extension actively.    Follow Up Recommendations  SNF    Equipment Recommendations  None recommended by OT    Recommendations for Other Services      Precautions / Restrictions Precautions Precautions: Fall Precaution Comments: trach, PEG, anoxic, flexor tone R>L LE       Mobility Bed Mobility Overal bed mobility: Needs Assistance Bed Mobility: Rolling Rolling: Max assist Sidelying to sit: Total assist Supine to sit: Max assist     General bed mobility comments: Pt assists with rolling Lt and Rt.  He seemed to resist attempts to move him to the EOB and pushed himself repeatedly back to sidelying on his Rt despite max attempts to maintain him EOB  Transfers                 General transfer comment: unable to attempt this date    Balance Overall balance assessment: Needs assistance Sitting-balance support: Feet supported;Feet unsupported;No upper extremity supported Sitting balance-Leahy Scale: Zero Sitting balance - Comments: Total A.  Pt pushing posteriorly and heavily to the Rt to return to sidelying                                    ADL either performed or assessed with clinical judgement   ADL Overall ADL's : Needs assistance/impaired     Grooming: Wash/dry face;Bed level;Total assistance Grooming Details (indicate cue type and reason): hand over hand assist provided                                     Vision       Perception     Praxis      Cognition Arousal/Alertness: Awake/alert Behavior During Therapy: Impulsive;Restless Overall Cognitive Status: Impaired/Different from baseline Area of Impairment: Attention;Following commands;Problem solving                   Current Attention Level: Focused   Following Commands: Follows one step commands inconsistently;Follows one step commands with increased time Safety/Judgement: Decreased awareness of safety   Problem Solving: Slow processing;Decreased initiation;Requires verbal cues;Requires tactile cues General Comments: Pt followed simple one step motor commands with up to a 30 second delay ~25% of the time        Exercises Other Exercises Other Exercises: pt positioned in partial chair position with a noticeable reduction in flexor spasticity of the LEs.  He was able to actively assist with extending bil. hips and knees.   Shoulder Instructions  General Comments      Pertinent Vitals/ Pain       Pain Assessment: Faces Faces Pain Scale: Hurts little more Pain Location: with attempts to sit EOB - generalized Pain Descriptors / Indicators: Grimacing;Moaning;Restless Pain Intervention(s): Monitored during session;Repositioned  Home Living                                          Prior Functioning/Environment              Frequency  Min 2X/week        Progress Toward Goals  OT Goals(current goals can now be found in the care plan section)  Progress towards OT goals: Progressing toward goals (slowly)     Plan Discharge plan remains appropriate    Co-evaluation                  AM-PAC OT "6 Clicks" Daily Activity     Outcome Measure   Help from another person eating meals?: Total Help from another person taking care of personal grooming?: Total Help from another person toileting, which includes using toliet, bedpan, or urinal?: Total Help from another person bathing (including washing, rinsing, drying)?: Total Help from another person to put on and taking off regular upper body clothing?: Total Help from another person to put on and taking off regular lower body clothing?: Total 6 Click Score: 6    End of Session Equipment Utilized During Treatment: Oxygen  OT Visit Diagnosis: Cognitive communication deficit (R41.841);Muscle weakness (generalized) (M62.81)   Activity Tolerance Patient limited by fatigue   Patient Left in bed;with call bell/phone within reach;with bed alarm set   Nurse Communication Mobility status        Time: 1829-9371 OT Time Calculation (min): 34 min  Charges: OT General Charges $OT Visit: 1 Visit OT Treatments $Neuromuscular Re-education: 23-37 mins  Eber Jones OTR/L Acute Rehabilitation Services Pager [phone] Office [phone]    Jeani Hawking M 01/17/2020, 4:27 PM

## 2020-01-17 NOTE — Plan of Care (Signed)
  Problem: Clinical Measurements: Goal: Will remain free from infection Outcome: Progressing   Problem: Safety: Goal: Ability to remain free from injury will improve Outcome: Progressing   Problem: Skin Integrity: Goal: Risk for impaired skin integrity will decrease Outcome: Progressing   Problem: Respiratory: Goal: Will regain and/or maintain adequate ventilation Outcome: Progressing

## 2020-01-17 NOTE — Plan of Care (Signed)
Patient remains free from falls. Safety precautions maintained. 

## 2020-01-18 LAB — GLUCOSE, CAPILLARY
Glucose-Capillary: 92 mg/dL (ref 70–99)
Glucose-Capillary: 102 mg/dL — ABNORMAL HIGH (ref 70–99)
Glucose-Capillary: 95 mg/dL (ref 70–99)
Glucose-Capillary: 126 mg/dL — ABNORMAL HIGH (ref 70–99)
Glucose-Capillary: 82 mg/dL (ref 70–99)

## 2020-01-18 LAB — CULTURE, RESPIRATORY W GRAM STAIN

## 2020-01-18 MED ORDER — TIZANIDINE HCL 4 MG PO TABS
4.0000 mg | ORAL_TABLET | Freq: Four times a day (QID) | ORAL | Status: DC
  Administered 2020-01-18 – 2020-02-06 (×75): 4 mg
  Filled 2020-01-18 (×82): qty 1

## 2020-01-18 NOTE — TOC Progression Note (Signed)
Transition of Care [facility]) - Progression Note    Patient Details  Name: Tayler Lassen MRN: [id number] Date of Birth: 02-11-1965  [facility] CM/SW Contact  Beckie Busing, RN Phone Number: [phone]  01/18/2020, 4:11 PM  Clinical Narrative:    CM followed up with Financial counselor Cathlean Marseilles. Who states that patients application is pending but she has not heard back from DSS to know who his assigned caseworker is. TOC will continue to follow for placement.     Expected Discharge Plan: Long Term Acute Care (LTAC) Barriers to Discharge: Continued Medical Work up  Expected Discharge Plan and Services Expected Discharge Plan: Long Term Acute Care (LTAC)   Discharge Planning Services: CM Consult   Living arrangements for the past 2 months: Single Family Home                                       Social Determinants of Health (SDOH) Interventions    Readmission Risk Interventions No flowsheet data found.

## 2020-01-18 NOTE — Plan of Care (Signed)
?  Problem: Clinical Measurements: ?Goal: Respiratory complications will improve ?Outcome: Progressing ?  ?Problem: Clinical Measurements: ?Goal: Cardiovascular complication will be avoided ?Outcome: Progressing ?  ?Problem: Nutrition: ?Goal: Adequate nutrition will be maintained ?Outcome: Progressing ?  ?Problem: Pain Managment: ?Goal: General experience of comfort will improve ?Outcome: Progressing ?  ?

## 2020-01-18 NOTE — Progress Notes (Signed)
[facility] PROGRESS NOTE  Ryan Orozco AVW:[id number] DOB: 19-Feb-1965 DOA: 11/01/2019 PCP: Default, Provider, MD    11/16   Status: Remains inpatient appropriate because:Altered mental status, Unsafe d/c plan and Inpatient level of care appropriate due to severity of illness. Patient newly diagnosed with anoxic brain injury  Dispo: The patient is from: Home              Anticipated d/c is to: SNF              Anticipated d/c date is: > 3 days              Patient currently is medically stable to d/c.  3 years to discharge: No SNF bed offers.  Needs trach capable facility.   Code Status: Full Family Communication: 12/15 dtr [name] by telephone DVT prophylaxis: Subcutaneous heparin Vaccination status: Covid vaccination status unknown  Foley catheter: No  HPI: 54 year old male with HIV, chronic alcohol abuse who was presented to the emergency department after being found unresponsive outside a convinient store.  EMS found him pulseless in PEA, unknown downtime, successfully resuscitated and brought to the emergency department.  UDS positive for THC/benzodiazepines.  CT head unremarkable.  Failed extubation trials due to severe anoxic brain injury and had tracheostomy placed.  PEG placed on 10/15.  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  This has led to significant pain as well.  [name] was consulted and recommendations/input/orders highly appreciated.  She may also benefit from Botox injections to both hips to aid in treatment of spasticity.   Significant Hospital Events   9/29 Admit post PEA arrest. UDS positive for THC, benzo's. ETOH 177 10/05 EEG ongoing. Versed restarted overnight due to agitation. Nicardipine increased.  10/06 Developed tachypnea with WUA, no follow commands off sedation  10/07 Vomiting, TF held / restarted with recurrent vomiting 10/16 tracheostomy collar for 9  hours 10/18->10/19 off vent all night  10/20> TC 11/15 > downsized to #4 Shiley flex CFS  Subjective: Awakened.  Calm.  No attempts at verbalization.  Again was attempting to sit up and became more comfortable when head of bed elevated nearly 90 degrees.  Objective: Vitals:   01/18/20 0552 01/18/20 0810  BP: 123/89   Pulse: 84 87  Resp: 20 18  Temp: 98.2 F (36.8 C)   SpO2: 98% 98%    Intake/Output Summary (Last 24 hours) at 01/18/2020 0948 Last data filed at 01/18/2020 0553 Gross per 24 hour  Intake --  Output 850 ml  Net -850 ml   Filed Weights   01/15/20 0105 01/15/20 2117 01/16/20 0130  Weight: 71.4 kg 71.9 kg 71.9 kg    Exam: General: Alert, calm, no acute distress Pulmonary: Bilateral lung sounds coarse to auscultation with some expiratory rhonchi, decreased in the bases, trach tube with large volume thick green expectorated sputum around trach and trach dressing, FiO2 28% Cardiac: S1-S2, no JVD, no peripheral edema, no resting tachycardia, appropriate capillary refill Abdomen: Soft nontender nondistended, PEG tube in place with tube feeding infusing Neurological: Awake, attempts to verbalize today or communicate with head nods improved upper extremity hypertonicity and dystonic movements with purposeful movements -continues to exhibit hypertonicity of lower extremities resulting in persistent flexion at hips R>L   Assessment/Plan: Acute problems: Anoxic brain injury 2/2 PEA arrest/pain syndrome secondary to hypertonicity/spastic tetraplegia:  -Presumed 2/2 combination alcohol and BZD overdose with UDS positive for THC and BZDs -Do not expect he will recover  fully - long-term SNF placement recommended -Continue PT/OT/SLP -Continue baclofen, gabapentin, scheduled oxycodone.  Will increase dose of Zanaflex to recommended max dose of 4 mg QID -Appreciate assistance of [name] given to Botox next week to right hamstrings, rectus femoris -Continue  bilateral upper extremity WHO resting splints -Continue low-dose Seroquel for brain injury sequela -Continue scheduled Tylenol for pain -Continue CREA muscle rub -KREG rotational bed ordered  Acute hypoxemic respiratory failure/new tracheostomy tube requirement -Hypoxemia now resolved-trach tube placed 10/3 -Continue PMV training per SLP -#4 cuffless trach-capping trial at discretion of trach team -[place] team following.  Hesitation regarding decannulation until patient can reliably follow commands, cough and clear airway. -Sputum culture obtained on 12/14 with moderate gram-positive rods and few gram-negative rods culture-no fever but given degree of congestion if culture positive may need to treat as acute tracheobronchitis -continue physiotherapy with Vibra vest -Continue supervised PMV trials   Dysphagia 2/2 anoxic brain injury -Continue tube feeding per PEG -Repeat SLP evaluation on 11/23 with recommendation for n.p.o. status  Goals of care:  -Palliative care was involved during this hospitalization.   -Goals of care were discussed. remains full code.  - Palliative care recommended outpatient follow-up with palliative providers. -May need to revisit end-of-life goals of care since patient making poor progress and currently having significant pain related to hypertonicity from brain injury  Protein calorie malnutrition nutrition Status: Nutrition Problem: Increased nutrient needs Etiology: acute illness Signs/Symptoms: estimated needs Interventions: Tube feeding bolus doses of Osmolite, Juven twice daily, Prosource TF 45 mL 3 times daily Estimated body mass index is 25.58 kg/m as calculated from the following:   Height as of this encounter: _0  (1.676 m).   Weight as of this encounter: 71.9 kg.   Multiple decubiti not POA Wound / Incision (Open or Dehisced) 11/17/19 Puncture Abdomen Left;Anterior;Upper G-tube insertion site  (Active)  Date First Assessed/Time First Assessed:  11/17/19 1646   Wound Type: Puncture  Location: Abdomen  Location Orientation: Left;Anterior;Upper  Wound Description (Comments): G-tube insertion site   Present on Admission: No    Assessments 11/17/2019  4:47 PM 01/17/2020 10:00 AM  Dressing Type Gauze (Comment);Tape dressing --  Dressing Changed New --  Dressing Status Clean;Dry;Intact --  Dressing Change Frequency PRN --  Site / Wound Assessment Clean;Dry;Pink --  Peri-wound Assessment Intact --  Wound Length (cm) -- 0 cm  Closure None --  Drainage Amount None --  Treatment Cleansed --     No Linked orders to display      Other problems: Hypertension/grade 1 diastolic dysfunction:  -Continue amlodipine  -Echocardiogram this admission with preserved LVEF with evidence of grade   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   -Continue Keppra; levetiracetam level 35.9 on 11/12 -no further seizure activity since transitioned out of ICU setting therefore will discontinue seizure precaution  HIV:  -CD4 count of 124.  -Dc'd Tivicay and Descovy infavor of Biktarvy to for eventual dc to SNF -Continue prophylactic Bactrim  Inguinal hernias -Evaluated by general surgery this admission. Documented as chronically incarcerated. Patient has been clinically stable and tolerating tube feedings and having bowel movements so unless patient obstructed would not be considered a candidate for repair. Even if obstructed consulting surgeon was not sure he would be a candidate for hernia repair regardless .   Data Reviewed: Basic Metabolic Panel: Recent Labs  Lab 01/13/20 0141 01/15/20 0924  NA 143 144  K 3.7 3.9  CL 108 105  CO2 23 27  GLUCOSE  98 98  BUN 19 14  CREATININE 0.79 0.76  CALCIUM 9.8 10.1   Liver Function Tests: Recent Labs  Lab 01/15/20 0924  AST 29  ALT 24  ALKPHOS 104  BILITOT 0.6  PROT 7.3  ALBUMIN 3.2*   No results for input(s): LIPASE, AMYLASE in the last 168 hours. No results for  input(s): AMMONIA in the last 168 hours. CBC: Recent Labs  Lab 01/13/20 0141 01/15/20 0924  WBC 4.3 3.0*  HGB 14.1 14.6  HCT 45.1 44.4  MCV 98.7 95.3  PLT 154 162   Cardiac Enzymes: No results for input(s): CKTOTAL, CKMB, CKMBINDEX, TROPONINI in the last 168 hours. BNP (last 3 results) No results for input(s): BNP in the last 8760 hours.  ProBNP (last 3 results) No results for input(s): PROBNP in the last 8760 hours.  CBG: Recent Labs  Lab 01/17/20 1944 01/17/20 2019 01/17/20 2325 01/18/20 0359 01/18/20 0705  GLUCAP <10* 153* 82 92 102*    Recent Results (from the past 240 hour(s))  Culture, respiratory (non-expectorated)     Status: None (Preliminary result)   Collection Time: 01/16/20  9:30 AM   Specimen: Tracheal Aspirate; Respiratory  Result Value Ref Range Status   Specimen Description TRACHEAL ASPIRATE  Final   Special Requests NONE  Final   Gram Stain   Final    MODERATE WBC PRESENT,BOTH PMN AND MONONUCLEAR MODERATE GRAM POSITIVE RODS FEW GRAM NEGATIVE RODS    Culture   Final    CULTURE REINCUBATED FOR BETTER GROWTH Performed at [facility], [place] [address] [place], [place] [address]    Report Status PENDING  Incomplete     Studies: No results found.  Scheduled Meds: . acetaminophen (TYLENOL) oral liquid 160 mg/5 mL  650 mg Per Tube Q6H  . amLODipine  10 mg Per Tube Daily  . baclofen  30 mg Per Tube TID  . chlorhexidine gluconate (MEDLINE KIT)  15 mL Mouth Rinse BID  . Darunavir-Cobicisctat-Emtricitabine-Tenofovir Alafenamide  1 tablet Oral Q breakfast  . famotidine  10.4 mg Per Tube BID  . feeding supplement (OSMOLITE 1.5 CAL)  237 mL Per Tube Q24H  . feeding supplement (OSMOLITE 1.5 CAL)  474 mL Per Tube TID  . feeding supplement (PROSource TF)  45 mL Per Tube Daily  . fiber  1 packet Per Tube BID  . folic acid  1 mg Per Tube Daily  . free water  200 mL Per Tube Q4H  . gabapentin  200 mg Per Tube Q8H  . heparin injection  (subcutaneous)  5,000 Units Subcutaneous Q8H  . levETIRAcetam  1,500 mg Per Tube BID  . Muscle Rub   Topical QID  . nystatin   Topical TID  . oxyCODONE  5 mg Per Tube Q6H  . QUEtiapine  12.5 mg Per Tube QHS  . scopolamine  1 patch Transdermal Q72H  . sulfamethoxazole-trimethoprim  1 tablet Per Tube Once per day on Mon Wed Fri  . thiamine  100 mg Per Tube Daily  . 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[facility])   Dysphagia   Protein-calorie malnutrition ([place])   Seizures ([place])   Bilateral recurrent inguinal hernia   Muscle spasticity   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[place])   Consultants:  PCCM  Palliative medicine  General surgery  Neurology  Interventional radiology  Procedures:  EEG  Echocardiogram  Tracheostomy  Antibiotics: Anti-infectives (From admission, onward)   Start     Dose/Rate Route Frequency Ordered Stop   12/19/19 0930  Darunavir-Cobicisctat-Emtricitabine-Tenofovir Alafenamide (SYMTUZA) 800-150-200-10 MG TABS 1 tablet        1 tablet Oral Daily with breakfast 12/19/19 0840     12/13/19 1000  bictegravir-emtricitabine-tenofovir AF (BIKTARVY) 50-200-25 MG per tablet 1 tablet  Status:  Discontinued        1 tablet Oral Daily 12/12/19 1413 12/19/19 0840   11/17/19 1548  ceFAZolin (ANCEF) 2-4 GM/100ML-% IVPB       Note to Pharmacy: Domenick Bookbinder   : cabinet override      11/17/19 1548 11/18/19 0359   11/16/19 1515  ceFAZolin (ANCEF) IVPB 2g/100 mL premix        2 g 200 mL/hr over 30 Minutes Intravenous To Radiology 11/16/19  1511 11/17/19 1625   11/15/19 0900  sulfamethoxazole-trimethoprim (BACTRIM DS) 800-160 MG per tablet 1 tablet        1 tablet Per Tube Once per day on Mon Wed Fri 11/13/19 1906     11/13/19 1615  dolutegravir (TIVICAY) tablet 50 mg  Status:  Discontinued        50 mg Oral Daily 11/13/19 1521 12/12/19 1413   11/13/19 1615  emtricitabine-tenofovir AF (DESCOVY) 200-25 MG per tablet 1 tablet  Status:  Discontinued        1 tablet Per Tube Daily 11/13/19 1521 12/12/19 1413   11/13/19 1530  sulfamethoxazole-trimethoprim (BACTRIM DS) 800-160 MG per tablet 1 tablet  Status:  Discontinued        1 tablet Oral Once per day on Mon Wed Fri 11/13/19 1441 11/13/19 1906   11/13/19 1500  bictegravir-emtricitabine-tenofovir AF (BIKTARVY) 50-200-25 MG per tablet 1 tablet  Status:  Discontinued        1 tablet Oral Daily 11/13/19 1403 11/13/19 1521   11/10/19 1000  erythromycin 250 mg in sodium chloride 0.9 % 100 mL IVPB        250 mg 100 mL/hr over 60 Minutes Intravenous Every 8 hours 11/10/19 0907 11/11/19 2000   11/07/19 1200  ceFEPIme (MAXIPIME) 2 g in sodium chloride 0.9 % 100 mL IVPB        2 g 200 mL/hr over 30 Minutes Intravenous Every 8 hours 11/07/19 1013 11/13/19 2127   11/03/19 1200  cefTRIAXone (ROCEPHIN) 2 g in sodium chloride 0.9 % 100 mL IVPB        2 g 200 mL/hr over 30 Minutes Intravenous Every 24 hours 11/03/19 0922 11/05/19 1427   11/02/19 0800  vancomycin (VANCOREADY) IVPB 750 mg/150 mL  Status:  Discontinued        750 mg 150 mL/hr over 60 Minutes Intravenous Every 12 hours 11/01/19 1935 11/02/19 1105   11/02/19 0600  piperacillin-tazobactam (ZOSYN) IVPB 3.375 g  Status:  Discontinued        3.375 g 12.5 mL/hr over 240 Minutes Intravenous Every 8 hours 11/02/19 0311 11/03/19 0920   11/01/19 2200  ceFEPIme (MAXIPIME) 2 g in sodium chloride 0.9 % 100 mL IVPB  Status:  Discontinued        2 g  200 mL/hr over 30 Minutes Intravenous Every 12 hours 11/01/19 2143 11/02/19 0311   11/01/19 1915   vancomycin (VANCOREADY) IVPB 1500 mg/300 mL        1,500 mg 150 mL/hr over 120 Minutes Intravenous  Once 11/01/19 1909 11/01/19 2147   11/01/19 1845  cefTRIAXone (ROCEPHIN) 1 g in sodium chloride 0.9 % 100 mL IVPB        1 g 200 mL/hr over 30 Minutes Intravenous  Once 11/01/19 1842 11/01/19 2051   11/01/19 1845  azithromycin (ZITHROMAX) 500 mg in sodium chloride 0.9 % 250 mL IVPB        500 mg 250 mL/hr over 60 Minutes Intravenous  Once 11/01/19 1842 11/01/19 2051       Time spent: 20 minutes    Erin Hearing ANP  [facility]  Pager [phone].  01/18/2020, 9:48 AM  LOS: 78 days

## 2020-01-19 DIAGNOSIS — B948 Sequelae of other specified infectious and parasitic diseases: Secondary | ICD-10-CM

## 2020-01-19 DIAGNOSIS — J40 Bronchitis, not specified as acute or chronic: Secondary | ICD-10-CM

## 2020-01-19 LAB — GLUCOSE, CAPILLARY
Glucose-Capillary: 100 mg/dL — ABNORMAL HIGH (ref 70–99)
Glucose-Capillary: 108 mg/dL — ABNORMAL HIGH (ref 70–99)
Glucose-Capillary: 110 mg/dL — ABNORMAL HIGH (ref 70–99)
Glucose-Capillary: 113 mg/dL — ABNORMAL HIGH (ref 70–99)
Glucose-Capillary: 113 mg/dL — ABNORMAL HIGH (ref 70–99)
Glucose-Capillary: 132 mg/dL — ABNORMAL HIGH (ref 70–99)
Glucose-Capillary: 138 mg/dL — ABNORMAL HIGH (ref 70–99)

## 2020-01-19 MED ORDER — PROSOURCE TF PO LIQD
45.0000 mL | Freq: Three times a day (TID) | ORAL | Status: DC
  Administered 2020-01-19 – 2020-04-01 (×220): 45 mL
  Filled 2020-01-19 (×219): qty 45

## 2020-01-19 MED ORDER — ACETYLCYSTEINE 10 % IN SOLN
4.0000 mL | Freq: Three times a day (TID) | RESPIRATORY_TRACT | Status: DC
  Filled 2020-01-19 (×5): qty 4

## 2020-01-19 MED ORDER — ALBUTEROL SULFATE (2.5 MG/3ML) 0.083% IN NEBU
2.5000 mg | INHALATION_SOLUTION | Freq: Three times a day (TID) | RESPIRATORY_TRACT | Status: DC
  Administered 2020-01-20 – 2020-01-23 (×10): 2.5 mg via RESPIRATORY_TRACT
  Filled 2020-01-19 (×10): qty 3

## 2020-01-19 MED ORDER — LEVOFLOXACIN 500 MG PO TABS
500.0000 mg | ORAL_TABLET | Freq: Every day | ORAL | Status: DC
  Administered 2020-01-19: 500 mg
  Filled 2020-01-19: qty 1

## 2020-01-19 MED ORDER — ALBUTEROL SULFATE (2.5 MG/3ML) 0.083% IN NEBU
INHALATION_SOLUTION | RESPIRATORY_TRACT | Status: AC
  Filled 2020-01-19: qty 3

## 2020-01-19 MED ORDER — ALBUTEROL SULFATE (2.5 MG/3ML) 0.083% IN NEBU
2.5000 mg | INHALATION_SOLUTION | Freq: Three times a day (TID) | RESPIRATORY_TRACT | Status: DC
  Administered 2020-01-19: 2.5 mg via RESPIRATORY_TRACT
  Filled 2020-01-19: qty 3

## 2020-01-19 MED ORDER — LINEZOLID 600 MG PO TABS
600.0000 mg | ORAL_TABLET | Freq: Two times a day (BID) | ORAL | Status: AC
  Administered 2020-01-19 – 2020-01-25 (×14): 600 mg
  Filled 2020-01-19 (×14): qty 1

## 2020-01-19 NOTE — Progress Notes (Addendum)
[facility] PROGRESS NOTE  Garv [place] TIW:[id number] DOB: [birth year] DOA: 11/01/2019 PCP: Default, Provider, MD    11/16   Status: Remains inpatient appropriate because:Altered mental status, Unsafe d/c plan and Inpatient level of care appropriate due to severity of illness. Patient newly diagnosed with anoxic brain injury  Dispo: The patient is from: Home              Anticipated d/c is to: SNF              Anticipated d/c date is: > 3 days              Patient currently is medically stable to d/c.  3 years to discharge: No SNF bed offers.  Needs trach capable facility. TOC communicating with financial counseling team who have yet to hear back from DSS regarding patient's assigned caseworker.   Code Status: Full Family Communication: 12/15 dtr [name] by telephone DVT prophylaxis: Subcutaneous heparin Vaccination status: Covid vaccination status unknown  Foley catheter: No  HPI: 54 year old male with HIV, chronic alcohol abuse who was presented to the emergency department after being found unresponsive outside a convinient store.  EMS found him pulseless in PEA, unknown downtime, successfully resuscitated and brought to the emergency department.  UDS positive for THC/benzodiazepines.  CT head unremarkable.  Failed extubation trials due to severe anoxic brain injury and had tracheostomy placed.  PEG placed on 10/15.  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  This has led to significant pain as well.  [name] was consulted and recommendations/input/orders highly appreciated.  She may also benefit from Botox injections to both hips to aid in treatment of spasticity.   Significant Hospital Events   9/29 Admit post PEA arrest. UDS positive for THC, benzo's. ETOH 177 10/05 EEG ongoing. Versed restarted overnight due to agitation. Nicardipine increased.  10/06 Developed tachypnea with WUA, no  follow commands off sedation  10/07 Vomiting, TF held / restarted with recurrent vomiting 10/16 tracheostomy collar for 9 hours 10/18->10/19 off vent all night  10/20> TC 11/15 > downsized to #4 Shiley flex CFS  Subjective: Awakened.  Began smiling and moving spontaneously.  Attempts to vocalize but speech unintelligible  Objective: Vitals:   01/19/20 0754 01/19/20 0955  BP: 121/83   Pulse: 94 (!) 106  Resp: 18 16  Temp: 97.8 F (36.6 C)   SpO2: 96% 94%    Intake/Output Summary (Last 24 hours) at 01/19/2020 0958 Last data filed at 01/19/2020 0554 Gross per 24 hour  Intake --  Output 1650 ml  Net -1650 ml   Filed Weights   01/15/20 0105 01/15/20 2117 01/16/20 0130  Weight: 71.4 kg 71.9 kg 71.9 kg    Exam: General: Alert, calm, no acute distress Pulmonary: Bilateral lung sounds coarse to auscultation with some expiratory rhonchi, decreased in the bases, trach tube with large volume thick green expectorated sputum around trach and trach dressing, FiO2 28% Cardiac: S1-S2, no JVD, no peripheral edema, no resting tachycardia, appropriate capillary refill Abdomen: Soft nontender nondistended, PEG tube in place with tube feeding infusing Neurological: Awake, attempts to vocalize but speech unintelligible- improved upper extremity hypertonicity and dystonic movements with purposeful movements -continues to exhibit hypertonicity of lower extremities/ persistent flexion at hips R>L   Assessment/Plan: Acute problems: Anoxic brain injury 2/2 PEA arrest/pain syndrome secondary to hypertonicity/spastic tetraplegia:  -Presumed 2/2 combination alcohol and BZD overdose with UDS positive for THC and BZDs -Do not expect he will  recover fully - long-term SNF placement recommended -Continue PT/OT/SLP -Continue baclofen, gabapentin, scheduled oxycodone and Zanaflex 4 mg QID -Appreciate assistance of [name] given to Botox next week to right hamstrings, rectus  femoris -Continue bilateral upper extremity WHO resting splints -Continue low-dose Seroquel for brain injury sequela -Continue scheduled Tylenol for pain -Continue CREA muscle rub -KREG rotational bed ordered  Acute hypoxemic respiratory failure/new tracheostomy tube requirement/Corynebacterium tracheobronchitis -Hypoxemia now resolved-trach tube placed 10/3 -Continue PMV training per SLP -#4 cuffless trach-capping trial at discretion of trach team -[place] team following.  Hesitation regarding decannulation until patient can reliably follow commands, cough and clear airway. -Sputum culture consistent with abundant Corynebacterium-begin Levaquin for 7 days-add Mucomyst nebs -continue physiotherapy with Vibra vest -Continue supervised PMV trials   Dysphagia 2/2 anoxic brain injury -Continue tube feeding per PEG -Repeat SLP evaluation on 11/23 with recommendation for n.p.o. status  Goals of care:  -Palliative care was involved during this hospitalization.   -Goals of care were discussed. remains full code.  - Palliative care recommended outpatient follow-up with palliative providers. -May need to revisit end-of-life goals of care since patient making poor progress and currently having significant pain related to hypertonicity from brain injury  Protein calorie malnutrition nutrition Status: Nutrition Problem: Increased nutrient needs Etiology: acute illness Signs/Symptoms: estimated needs Interventions: Tube feeding bolus doses of Osmolite, Juven twice daily, Prosource TF 45 mL 3 times daily Estimated body mass index is 25.58 kg/m as calculated from the following:   Height as of this encounter: 5' 6"  (1.676 m).   Weight as of this encounter: 71.9 kg.   Multiple decubiti not POA Wound / Incision (Open or Dehisced) 11/17/19 Puncture Abdomen Left;Anterior;Upper G-tube insertion site  (Active)  Date First Assessed/Time First Assessed: 11/17/19 1646   Wound Type: Puncture  Location:  Abdomen  Location Orientation: Left;Anterior;Upper  Wound Description (Comments): G-tube insertion site   Present on Admission: No    Assessments 11/17/2019  4:47 PM 01/17/2020 10:00 AM  Dressing Type Gauze (Comment);Tape dressing --  Dressing Changed New --  Dressing Status Clean;Dry;Intact --  Dressing Change Frequency PRN --  Site / Wound Assessment Clean;Dry;Pink --  Peri-wound Assessment Intact --  Wound Length (cm) -- 0 cm  Closure None --  Drainage Amount None --  Treatment Cleansed --     No Linked orders to display      Other problems: Hypertension/grade 1 diastolic dysfunction:  -Continue amlodipine  -Echocardiogram this admission with preserved LVEF with evidence of grade   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   -Continue Keppra; levetiracetam level 35.9 on 11/12 -no further seizure activity since transitioned out of ICU setting therefore will discontinue seizure precaution  HIV:  -CD4 count of 124.  -Dc'd Tivicay and Descovy infavor of Biktarvy to for eventual dc to SNF -Continue prophylactic Bactrim  Inguinal hernias -Evaluated by general surgery this admission. Documented as chronically incarcerated. Patient has been clinically stable and tolerating tube feedings and having bowel movements so unless patient obstructed would not be considered a candidate for repair. Even if obstructed consulting surgeon was not sure he would be a candidate for hernia repair regardless .   Data Reviewed: Basic Metabolic Panel: Recent Labs  Lab 01/13/20 0141 01/15/20 0924  NA 143 144  K 3.7 3.9  CL 108 105  CO2 23 27  GLUCOSE 98 98  BUN 19 14  CREATININE 0.79 0.76  CALCIUM 9.8 10.1   Liver Function Tests: Recent Labs  Lab 01/15/20 0924  AST 29  ALT 24  ALKPHOS 104  BILITOT 0.6  PROT 7.3  ALBUMIN 3.2*   No results for input(s): LIPASE, AMYLASE in the last 168 hours. No results for input(s): AMMONIA in the last 168 hours. CBC: Recent  Labs  Lab 01/13/20 0141 01/15/20 0924  WBC 4.3 3.0*  HGB 14.1 14.6  HCT 45.1 44.4  MCV 98.7 95.3  PLT 154 162   Cardiac Enzymes: No results for input(s): CKTOTAL, CKMB, CKMBINDEX, TROPONINI in the last 168 hours. BNP (last 3 results) No results for input(s): BNP in the last 8760 hours.  ProBNP (last 3 results) No results for input(s): PROBNP in the last 8760 hours.  CBG: Recent Labs  Lab 01/18/20 1718 01/18/20 2039 01/19/20 0053 01/19/20 0414 01/19/20 0718  GLUCAP 126* 82 113* 100* 132*    Recent Results (from the past 240 hour(s))  Culture, respiratory (non-expectorated)     Status: None   Collection Time: 01/16/20  9:30 AM   Specimen: Tracheal Aspirate; Respiratory  Result Value Ref Range Status   Specimen Description TRACHEAL ASPIRATE  Final   Special Requests NONE  Final   Gram Stain   Final    MODERATE WBC PRESENT,BOTH PMN AND MONONUCLEAR MODERATE GRAM POSITIVE RODS FEW GRAM NEGATIVE RODS    Culture   Final    ABUNDANT CORYNEBACTERIUM STRIATUM Standardized susceptibility testing for this organism is not available. Performed at [facility], [place] [address] [place], [place] [address]    Report Status 01/18/2020 FINAL  Final     Studies: No results found.  Scheduled Meds: . acetaminophen (TYLENOL) oral liquid 160 mg/5 mL  650 mg Per Tube Q6H  . amLODipine  10 mg Per Tube Daily  . baclofen  30 mg Per Tube TID  . chlorhexidine gluconate (MEDLINE KIT)  15 mL Mouth Rinse BID  . Darunavir-Cobicisctat-Emtricitabine-Tenofovir Alafenamide  1 tablet Oral Q breakfast  . famotidine  10.4 mg Per Tube BID  . feeding supplement (OSMOLITE 1.5 CAL)  237 mL Per Tube Q24H  . feeding supplement (OSMOLITE 1.5 CAL)  474 mL Per Tube TID  . feeding supplement (PROSource TF)  45 mL Per Tube Daily  . fiber  1 packet Per Tube BID  . folic acid  1 mg Per Tube Daily  . free water  200 mL Per Tube Q4H  . gabapentin  200 mg Per Tube Q8H  . heparin injection (subcutaneous)   5,000 Units Subcutaneous Q8H  . levETIRAcetam  1,500 mg Per Tube BID  . Muscle Rub   Topical QID  . nystatin   Topical TID  . oxyCODONE  5 mg Per Tube Q6H  . QUEtiapine  12.5 mg Per Tube QHS  . scopolamine  1 patch Transdermal Q72H  . sulfamethoxazole-trimethoprim  1 tablet Per Tube Once per day on Mon Wed Fri  . thiamine  100 mg Per Tube Daily  . tiZANidine  4 mg Per Tube QID   Continuous Infusions:   Active Problems:   Cardiac arrest [facility])   Community acquired pneumonia of left upper lobe of lung   Anoxic brain injury ([place])   Alcohol abuse   Acute respiratory failure with hypoxia ([facility])   Vomiting   Pressure injury of skin   Small bowel obstruction ([facility])   Palliative care encounter   Bowel obstruction ([facility])   Chronic respiratory failure ([facility])   Diastolic dysfunction   Tracheostomy dependent ([place])   Dysphagia   Protein-calorie malnutrition ([facility])   Seizures ([place])  Bilateral recurrent inguinal hernia   Muscle spasticity   Spastic tetraplegia with rigidity syndrome [facility])   Consultants:  PCCM  Palliative medicine  General surgery  Neurology  Interventional radiology  Procedures:  EEG  Echocardiogram  Tracheostomy  Antibiotics: Anti-infectives (From admission, onward)   Start     Dose/Rate Route Frequency Ordered Stop   12/19/19 0930  Darunavir-Cobicisctat-Emtricitabine-Tenofovir Alafenamide (SYMTUZA) 800-150-200-10 MG TABS 1 tablet        1 tablet Oral Daily with breakfast 12/19/19 0840     12/13/19 1000  bictegravir-emtricitabine-tenofovir AF (BIKTARVY) 50-200-25 MG per tablet 1 tablet  Status:  Discontinued        1 tablet Oral Daily 12/12/19 1413 12/19/19 0840   11/17/19 1548  ceFAZolin (ANCEF) 2-4 GM/100ML-% IVPB       Note to Pharmacy: Domenick Bookbinder   : cabinet override      11/17/19 1548 11/18/19 0359   11/16/19 1515  ceFAZolin (ANCEF) IVPB 2g/100 mL premix        2 g 200 mL/hr over 30 Minutes Intravenous To Radiology 11/16/19 1511 11/17/19  1625   11/15/19 0900  sulfamethoxazole-trimethoprim (BACTRIM DS) 800-160 MG per tablet 1 tablet        1 tablet Per Tube Once per day on Mon Wed Fri 11/13/19 1906     11/13/19 1615  dolutegravir (TIVICAY) tablet 50 mg  Status:  Discontinued        50 mg Oral Daily 11/13/19 1521 12/12/19 1413   11/13/19 1615  emtricitabine-tenofovir AF (DESCOVY) 200-25 MG per tablet 1 tablet  Status:  Discontinued        1 tablet Per Tube Daily 11/13/19 1521 12/12/19 1413   11/13/19 1530  sulfamethoxazole-trimethoprim (BACTRIM DS) 800-160 MG per tablet 1 tablet  Status:  Discontinued        1 tablet Oral Once per day on Mon Wed Fri 11/13/19 1441 11/13/19 1906   11/13/19 1500  bictegravir-emtricitabine-tenofovir AF (BIKTARVY) 50-200-25 MG per tablet 1 tablet  Status:  Discontinued        1 tablet Oral Daily 11/13/19 1403 11/13/19 1521   11/10/19 1000  erythromycin 250 mg in sodium chloride 0.9 % 100 mL IVPB        250 mg 100 mL/hr over 60 Minutes Intravenous Every 8 hours 11/10/19 0907 11/11/19 2000   11/07/19 1200  ceFEPIme (MAXIPIME) 2 g in sodium chloride 0.9 % 100 mL IVPB        2 g 200 mL/hr over 30 Minutes Intravenous Every 8 hours 11/07/19 1013 11/13/19 2127   11/03/19 1200  cefTRIAXone (ROCEPHIN) 2 g in sodium chloride 0.9 % 100 mL IVPB        2 g 200 mL/hr over 30 Minutes Intravenous Every 24 hours 11/03/19 0922 11/05/19 1427   11/02/19 0800  vancomycin (VANCOREADY) IVPB 750 mg/150 mL  Status:  Discontinued        750 mg 150 mL/hr over 60 Minutes Intravenous Every 12 hours 11/01/19 1935 11/02/19 1105   11/02/19 0600  piperacillin-tazobactam (ZOSYN) IVPB 3.375 g  Status:  Discontinued        3.375 g 12.5 mL/hr over 240 Minutes Intravenous Every 8 hours 11/02/19 0311 11/03/19 0920   11/01/19 2200  ceFEPIme (MAXIPIME) 2 g in sodium chloride 0.9 % 100 mL IVPB  Status:  Discontinued        2 g 200 mL/hr over 30 Minutes Intravenous Every 12 hours 11/01/19 2143 11/02/19 0311   11/01/19 1915  vancomycin  (VANCOREADY)  IVPB 1500 mg/300 mL        1,500 mg 150 mL/hr over 120 Minutes Intravenous  Once 11/01/19 1909 11/01/19 2147   11/01/19 1845  cefTRIAXone (ROCEPHIN) 1 g in sodium chloride 0.9 % 100 mL IVPB        1 g 200 mL/hr over 30 Minutes Intravenous  Once 11/01/19 1842 11/01/19 2051   11/01/19 1845  azithromycin (ZITHROMAX) 500 mg in sodium chloride 0.9 % 250 mL IVPB        500 mg 250 mL/hr over 60 Minutes Intravenous  Once 11/01/19 1842 11/01/19 2051       Time spent: 20 minutes    Erin Hearing ANP  [facility]  Pager [phone].  01/19/2020, 9:58 AM  LOS: 79 days

## 2020-01-19 NOTE — TOC Progression Note (Signed)
Transition of Care [facility]) - Progression Note    Patient Details  Name: Ryan Orozco MRN: [id number] Date of Birth: [birth year]  Transition of Care [facility]) CM/SW Contact  Beckie Busing, RN Phone Number: [phone]  01/19/2020, 10:09 AM  Clinical Narrative:    CM received text message from daughter stating that she received letter that disability has received application on 11/22. The medicaid CM told daughter that once disability makes their determination they can proceed with medicaid application. Medicaid CM is [employer] [phone]. Thurnell Lose [phone] from servant center also helped submit disability paperwork.    Expected Discharge Plan: Long Term Acute Care (LTAC) Barriers to Discharge: Continued Medical Work up  Expected Discharge Plan and Services Expected Discharge Plan: Long Term Acute Care (LTAC)   Discharge Planning Services: CM Consult   Living arrangements for the past 2 months: Single Family Home                                       Social Determinants of Health (SDOH) Interventions    Readmission Risk Interventions No flowsheet data found.

## 2020-01-19 NOTE — Progress Notes (Signed)
Nutrition Follow-up  DOCUMENTATION CODES:   Not applicable  INTERVENTION:  Continue bolus TF regimen via PEG: -Provide 2 cartons (475m) Osmolite 1.5 TID @ 0800, 1200, 1700 -Provide 1 carton (2315m Osmolite 1.5 once daily at bedtime @ 2100 -Flush with 6024mree water before and after each tube feeding bolus -Provide 66m41mosource TF TID -Provide Nutrisource fiber BID -Additional free water flushes per MD, currently 200ml57m  Tube feeding regimen provides 2605 kcals, 137 grams protein,1267ml 17m water (2947ml w13mflushes)  NUTRITION DIAGNOSIS:   Increased nutrient needs related to acute illness as evidenced by estimated needs. 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 PMH includes HIV and EtOH use.  09/29 - intubated 10/07- vomiting, TF held, laterrestarted with recurrent vomiting 10/12-R/L inguinal hernia, partial SBO  10/13- trach 10/15-PEG 10/16 - tolerated TC for 9 hours 10/18->10/19 - off vent overnight 10/20- TC 11/15 - trach downsized to #4 Shiley flex CFS 12/06 - trach changed (same #4 Shiley flex CFS)  Placement continues to be difficult for this pt. Pt remains NPO and on TC. Pt receiving bolus TF via PEG. Current TF regimen is as follows: Provide 2 cartons (474ml) O21mite 1.5 TID @ 0800, 1200, 1700; Provide 1 carton (237ml) Os70mte 1.5 once daily at bedtime @ 2100; flush with 60ml free11mer before and after each tube feeding bolus; Provide 66ml Proso17m TF daily; Provide Nutrisource fiber BID. Pt also receiving an additional  200ml free w70m boluses Q4H. Current tube feeding regimen provides 2525 kcals, 115 grams protein,1267ml free wa81m(2947ml with flu8m)  Pt's weight has fluctuated throughout admission due to edema/fluid status; however, overall trend in pt's weight has continued to decline despite numerous adjustments to the pt's estimated needs. Will further adjust estimated needs and recommended TF regimen (using pt's most recent wt) due to pt's continued wt loss.   Admission weight: 78.9 kg Current weight: 71.9 kg  UOP: 1650ml x24 hours72mbs reviewed. Medications: folvite, pepcid, thiamine  Diet Order:   Diet Order            Diet NPO time specified  Diet effective midnight                 EDUCATION NEEDS:   Not appropriate for education at this time  Skin:  Skin Assessment: Skin Integrity Issues: Skin Integrity Issues:: Other (Comment) Stage II: N/A Stage III: N/A Other: puncture abdomen (PEG insertion site)  Last BM:  12/17 type 7  Height:   Ht Readings from Last 1 Encounters:  11/01/19 5' 6"  (1.676 m)    Weight:   Wt Readings from Last 1 Encounters:  01/16/20 71.9 kg    BMI:  Body mass index is 25.58 kg/m.  Estimated Nutritional Needs:   Kcal:  2400-2600  Protein:  120-135 grams  Fluid:  >2L/d    Jayliah Benett,Larkin InaD pager number and weekend/on-call pager number located in [email or web address]

## 2020-01-19 NOTE — Progress Notes (Signed)
Physical Therapy Treatment Patient Details Name: Ryan Orozco MRN: [id number] DOB: [birth year] Today's Date: 01/19/2020    History of Present Illness 54 year old male found down outside convenience store 9/27. Pt with PEA and hypoxic brain injury. Trach 10/13, PEG 10/15. PMHx: HIV AIDS and alcohol abuse    PT Comments    Pt tolerated OOB in recliner chair for wheeling around in the hallway for >30 mins.  Repositioned back in bed as pt is not safe to stay up alone due to tone/spasms.  R LE remains fairly rigid and pt elevating shoulders at levators/upper traps today as well in flexion pattern.  Pt exhausted at end of session sleeping comfortably in new air bed. Goals assessed and adjusted as needed.  PT will continue to follow acutely for safe mobility progression   Follow Up Recommendations  SNF     Equipment Recommendations  Wheelchair (measurements PT);Wheelchair cushion (measurements PT);Hospital bed;Other (comment)    Recommendations for Other Services       Precautions / Restrictions Precautions Precautions: Fall Precaution Comments: trach, PEG, anoxic, flexor tone R>L LE    Mobility  Bed Mobility Overal bed mobility: Needs Assistance Bed Mobility: Rolling Rolling: Total assist            Transfers Overall transfer level: Needs assistance               General transfer comment: Lateral slide to drop arm recliner chair with chair fully reclined.  Unfortunately, the maxi move lift battery was dead, otherwise I would have used that for OOB mobility.  Ambulation/Gait                 Stairs             Wheelchair Mobility    Modified Rankin (Stroke Patients Only)       Balance                                            Cognition Arousal/Alertness: Awake/alert Behavior During Therapy: Impulsive;Restless Overall Cognitive Status: Impaired/Different from baseline Area of Impairment: Attention;Following  commands;Problem solving                   Current Attention Level: Focused   Following Commands: Follows one step commands inconsistently;Follows one step commands with increased time Safety/Judgement: Decreased awareness of safety Awareness: Intellectual Problem Solving: Slow processing;Decreased initiation;Requires verbal cues;Requires tactile cues General Comments: 1/4 command following within areas of his physical ability (best luck with arms and face).  Pt seems more flat today, not as much smiling.      Exercises      General Comments General comments (skin integrity, edema, etc.): Pt positioned with pillows and safety belt in recliner chair with feet down, used music to help with mood.  WEnt down to the hallway in recliner for change of scenery.  VSS on RA throughout hallway mobility.      Pertinent Vitals/Pain Pain Assessment: Faces Faces Pain Scale: Hurts little more Pain Location: generalized with tonal spasms and attempts at ROM to R LE Pain Descriptors / Indicators: Grimacing;Moaning;Restless Pain Intervention(s): Limited activity within patient's tolerance;Monitored during session;Repositioned    Home Living                      Prior Function            PT Goals (current  goals can now be found in the care plan section) Acute Rehab PT Goals Patient Stated Goal: unable to state Progress towards PT goals: Progressing toward goals    Frequency    Min 2X/week      PT Plan Current plan remains appropriate    Co-evaluation              AM-PAC PT "6 Clicks" Mobility   Outcome Measure  Help needed turning from your back to your side while in a flat bed without using bedrails?: Total Help needed moving from lying on your back to sitting on the side of a flat bed without using bedrails?: Total Help needed moving to and from a bed to a chair (including a wheelchair)?: Total Help needed standing up from a chair using your arms (e.g.,  wheelchair or bedside chair)?: Total Help needed to walk in hospital room?: Total Help needed climbing 3-5 steps with a railing? : Total 6 Click Score: 6    End of Session Equipment Utilized During Treatment: Gait belt Activity Tolerance: Patient tolerated treatment well Patient left: in bed;with call bell/phone within reach;with bed alarm set   PT Visit Diagnosis: Other abnormalities of gait and mobility (R26.89);Muscle weakness (generalized) (M62.81)     Time: 3557-3220 PT Time Calculation (min) (ACUTE ONLY): 70 min  Charges:  $Therapeutic Activity: 68-82 mins                     Corinna Capra, PT, DPT  Acute Rehabilitation [phone] pager [phone]) [phone] office

## 2020-01-19 NOTE — Progress Notes (Signed)
ID Pharmacy Note  Spoke with [name], NP. Will change levofloxacin to linezolid 600 mg BID X 7 days to target corynebacterium striatum in trach aspirate.     Sharin Mons, PharmD, BCPS, BCIDP Infectious Diseases Clinical Pharmacist Phone: [phone] 01/19/2020 3:37 PM

## 2020-01-20 LAB — GLUCOSE, CAPILLARY
Glucose-Capillary: 116 mg/dL — ABNORMAL HIGH (ref 70–99)
Glucose-Capillary: 122 mg/dL — ABNORMAL HIGH (ref 70–99)
Glucose-Capillary: 167 mg/dL — ABNORMAL HIGH (ref 70–99)
Glucose-Capillary: 170 mg/dL — ABNORMAL HIGH (ref 70–99)
Glucose-Capillary: 182 mg/dL — ABNORMAL HIGH (ref 70–99)
Glucose-Capillary: 99 mg/dL (ref 70–99)

## 2020-01-20 MED ORDER — ACETYLCYSTEINE 20 % IN SOLN
4.0000 mL | Freq: Three times a day (TID) | RESPIRATORY_TRACT | Status: DC
  Administered 2020-01-20 – 2020-01-23 (×10): 4 mL via RESPIRATORY_TRACT
  Filled 2020-01-20 (×12): qty 4

## 2020-01-20 NOTE — Progress Notes (Signed)
Referred bld sugar-170 mg/dl to DR Opyd, called up and said that just  to observe bld sugar since previous result was normal

## 2020-01-20 NOTE — Progress Notes (Signed)
[facility] PROGRESS NOTE  Ryan Orozco YPP:[id number] DOB: 27-Jan-1966 DOA: 11/01/2019 PCP: Default, Provider, MD   Status: Remains inpatient appropriate because:Altered mental status, Unsafe d/c plan and Inpatient level of care appropriate due to severity of illness. Patient newly diagnosed with anoxic brain injury  Dispo: The patient is from: Home              Anticipated d/c is to: SNF              Anticipated d/c date is: > 3 days              Patient currently is medically stable to d/c.Marland Kitchen  Barriers to discharge: No SNF bed offers.  Needs trach capable facility. TOC communicating with financial counseling team who have yet to hear back from DSS regarding patient's assigned caseworker.   Code Status: Full Family Communication: 12/15 dtr Ryan Orozco by telephone DVT prophylaxis: Subcutaneous heparin Vaccination status: Covid vaccination status unknown  Foley catheter: No  HPI: 54 year old male with HIV, chronic alcohol abuse who was presented to the emergency department after being found unresponsive outside a convinient store.  EMS found him pulseless in PEA, unknown downtime, successfully resuscitated and brought to the emergency department.  UDS positive for THC/benzodiazepines.  CT head unremarkable.  Failed extubation trials due to severe anoxic brain injury and had tracheostomy placed.  PEG placed on 10/15.  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  This has led to significant pain as well.  [name] was consulted and recommendations/input/orders highly appreciated.  She may also benefit from Botox injections to both hips to aid in treatment of spasticity.   Significant Hospital Events   9/29 Admit post PEA arrest. UDS positive for THC, benzo's. ETOH 177 10/05 EEG ongoing. Versed restarted overnight due to agitation. Nicardipine increased.  10/06 Developed tachypnea with WUA, no follow  commands off sedation  10/07 Vomiting, TF held / restarted with recurrent vomiting 10/16 tracheostomy collar for 9 hours 10/18->10/19 off vent all night  10/20> TC 11/15 > downsized to #4 Shiley flex CFS  Subjective: Opens eyes on verbal command.  Makes a smiley face.  He has thick crusted secretions around his mouth  Objective: Vitals:   01/20/20 1033 01/20/20 1235  BP: 121/77   Pulse: 87 89  Resp: 18 (!) 21  Temp: 99.6 F (37.6 C)   SpO2: 100% 93%    Intake/Output Summary (Last 24 hours) at 01/20/2020 1412 Last data filed at 01/19/2020 2053 Gross per 24 hour  Intake --  Output 850 ml  Net -850 ml   Filed Weights   01/15/20 2117 01/16/20 0130 01/20/20 0500  Weight: 71.9 kg 71.9 kg 72 kg    Exam: General: Alert, calm, no acute distress Pulmonary: Bilateral lung sounds coarse to auscultation with some expiratory rhonchi, decreased in the bases, trach tube with large volume thick green expectorated sputum around trach and trach dressing, FiO2 28% Cardiac: S1-S2, no JVD, no peripheral edema, no resting tachycardia, appropriate capillary refill Abdomen: Soft nontender nondistended, PEG tube in place with tube feeding infusing Neurological: Awake, attempts to vocalize but speech unintelligible- improved upper extremity hypertonicity and dystonic movements with purposeful movements -continues to exhibit hypertonicity of lower extremities/ persistent flexion at hips R>L   Assessment/Plan: Acute problems: Anoxic brain injury 2/2 PEA arrest/pain syndrome secondary to hypertonicity/spastic tetraplegia:  -Presumed 2/2 combination alcohol and BZD overdose with UDS positive for THC and BZDs -Do not expect he will  recover fully - long-term SNF placement recommended -Continue PT/OT/SLP -Continue baclofen, gabapentin, scheduled oxycodone and Zanaflex 4 mg QID -Appreciate assistance of [name] given to Botox next week to right hamstrings, rectus femoris -Continue  bilateral upper extremity WHO resting splints -Continue low-dose Seroquel for brain injury sequela -Continue scheduled Tylenol for pain -Continue CREA muscle rub -KREG rotational bed ordered  Acute hypoxemic respiratory failure/new tracheostomy tube requirement/Corynebacterium tracheobronchitis -Hypoxemia now resolved-trach tube placed 10/3 -Continue PMV training per SLP -#4 cuffless trach-capping trial at discretion of trach team -[place] team following.  Hesitation regarding decannulation until patient can reliably follow commands, cough and clear airway. -Sputum culture consistent with abundant Corynebacterium-begin Levaquin for 7 days-add Mucomyst nebs -continue physiotherapy with Vibra vest -Continue supervised PMV trials   Dysphagia 2/2 anoxic brain injury -Continue tube feeding per PEG -Repeat SLP evaluation on 11/23 with recommendation for n.p.o. status  Goals of care:  -Palliative care was involved during this hospitalization.   -Goals of care were discussed. remains full code.  - Palliative care recommended outpatient follow-up with palliative providers. -May need to revisit end-of-life goals of care since patient making poor progress and currently having significant pain related to hypertonicity from brain injury  Protein calorie malnutrition nutrition Status: Nutrition Problem: Increased nutrient needs Etiology: acute illness Signs/Symptoms: estimated needs Interventions: Tube feeding bolus doses of Osmolite, Juven twice daily, Prosource TF 45 mL 3 times daily Estimated body mass index is 25.61 kg/m as calculated from the following:   Height as of this encounter: 5' 6"  (1.676 m).   Weight as of this encounter: 72 kg.   Multiple decubiti not POA Wound / Incision (Open or Dehisced) 11/17/19 Puncture Abdomen Left;Anterior;Upper G-tube insertion site  (Active)  Date First Assessed/Time First Assessed: 11/17/19 1646   Wound Type: Puncture  Location: Abdomen  Location  Orientation: Left;Anterior;Upper  Wound Description (Comments): G-tube insertion site   Present on Admission: No    Assessments 11/17/2019  4:47 PM 01/17/2020 10:00 AM  Dressing Type Gauze (Comment);Tape dressing --  Dressing Changed New --  Dressing Status Clean;Dry;Intact --  Dressing Change Frequency PRN --  Site / Wound Assessment Clean;Dry;Pink --  Peri-wound Assessment Intact --  Wound Length (cm) -- 0 cm  Closure None --  Drainage Amount None --  Treatment Cleansed --     No Linked orders to display    Other problems: Hypertension/grade 1 diastolic dysfunction:  -Continue amlodipine  -Echocardiogram this admission with preserved LVEF with evidence of grade   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   -Continue Keppra; levetiracetam level 35.9 on 11/12 -no further seizure activity since transitioned out of ICU setting therefore will discontinue seizure precaution  HIV:  -CD4 count of 124.  -Dc'd Tivicay and Descovy infavor of Biktarvy to for eventual dc to SNF -Continue prophylactic Bactrim  Inguinal hernias -Evaluated by general surgery this admission. Documented as chronically incarcerated. Patient has been clinically stable and tolerating tube feedings and having bowel movements so unless patient obstructed would not be considered a candidate for repair. Even if obstructed consulting surgeon was not sure he would be a candidate for hernia repair regardless.  Data Reviewed: Basic Metabolic Panel: Recent Labs  Lab 01/15/20 0924  NA 144  K 3.9  CL 105  CO2 27  GLUCOSE 98  BUN 14  CREATININE 0.76  CALCIUM 10.1   Liver Function Tests: Recent Labs  Lab 01/15/20 0924  AST 29  ALT 24  ALKPHOS 104  BILITOT 0.6  PROT  7.3  ALBUMIN 3.2*   No results for input(s): LIPASE, AMYLASE in the last 168 hours. No results for input(s): AMMONIA in the last 168 hours. CBC: Recent Labs  Lab 01/15/20 0924  WBC 3.0*  HGB 14.6  HCT 44.4  MCV 95.3   PLT 162   Cardiac Enzymes: No results for input(s): CKTOTAL, CKMB, CKMBINDEX, TROPONINI in the last 168 hours. BNP (last 3 results) No results for input(s): BNP in the last 8760 hours.  ProBNP (last 3 results) No results for input(s): PROBNP in the last 8760 hours.  CBG: Recent Labs  Lab 01/19/20 2003 01/19/20 2318 01/20/20 0439 01/20/20 0830 01/20/20 1207  GLUCAP 113* 108* 122* 99 182*    Recent Results (from the past 240 hour(s))  Culture, respiratory (non-expectorated)     Status: None   Collection Time: 01/16/20  9:30 AM   Specimen: Tracheal Aspirate; Respiratory  Result Value Ref Range Status   Specimen Description TRACHEAL ASPIRATE  Final   Special Requests NONE  Final   Gram Stain   Final    MODERATE WBC PRESENT,BOTH PMN AND MONONUCLEAR MODERATE GRAM POSITIVE RODS FEW GRAM NEGATIVE RODS    Culture   Final    ABUNDANT CORYNEBACTERIUM STRIATUM Standardized susceptibility testing for this organism is not available. Performed at [facility], [place] [address], [place], [place] [address]    Report Status 01/18/2020 FINAL  Final     Studies: No results found.  Scheduled Meds: . acetaminophen (TYLENOL) oral liquid 160 mg/5 mL  650 mg Per Tube Q6H  . acetylcysteine  4 mL Nebulization TID  . albuterol  2.5 mg Nebulization TID  . amLODipine  10 mg Per Tube Daily  . baclofen  30 mg Per Tube TID  . chlorhexidine gluconate (MEDLINE KIT)  15 mL Mouth Rinse BID  . Darunavir-Cobicisctat-Emtricitabine-Tenofovir Alafenamide  1 tablet Oral Q breakfast  . famotidine  10.4 mg Per Tube BID  . feeding supplement (OSMOLITE 1.5 CAL)  237 mL Per Tube Q24H  . feeding supplement (OSMOLITE 1.5 CAL)  474 mL Per Tube TID  . feeding supplement (PROSource TF)  45 mL Per Tube TID  . fiber  1 packet Per Tube BID  . folic acid  1 mg Per Tube Daily  . free water  200 mL Per Tube Q4H  . gabapentin  200 mg Per Tube Q8H  . heparin injection (subcutaneous)  5,000 Units Subcutaneous  Q8H  . levETIRAcetam  1,500 mg Per Tube BID  . linezolid  600 mg Per Tube Q12H  . Muscle Rub   Topical QID  . nystatin   Topical TID  . oxyCODONE  5 mg Per Tube Q6H  . QUEtiapine  12.5 mg Per Tube QHS  . scopolamine  1 patch Transdermal Q72H  . sulfamethoxazole-trimethoprim  1 tablet Per Tube Once per day on Mon Wed Fri  . thiamine  100 mg Per Tube Daily  . 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[facility])   Dysphagia   Protein-calorie malnutrition (  [place])   Seizures ([place])   Bilateral recurrent inguinal hernia   Muscle spasticity   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[place])   Tracheobronchitis   Sequela of Corynebacterium infection   Consultants:  PCCM  Palliative medicine  General surgery  Neurology  Interventional radiology  Procedures:  EEG  Echocardiogram  Tracheostomy  Antibiotics: Anti-infectives (From admission, onward)   Start     Dose/Rate Route Frequency Ordered Stop   01/19/20 2200  linezolid (ZYVOX) tablet 600 mg        600 mg Per Tube Every 12 hours 01/19/20 1534 01/26/20 2159   01/19/20 1100  levofloxacin (LEVAQUIN) tablet 500 mg  Status:  Discontinued        500 mg Per Tube Daily 01/19/20 1004 01/19/20 1534   12/19/19 0930  Darunavir-Cobicisctat-Emtricitabine-Tenofovir Alafenamide (SYMTUZA) 800-150-200-10 MG TABS 1 tablet        1 tablet Oral Daily with breakfast 12/19/19 0840     12/13/19 1000  bictegravir-emtricitabine-tenofovir AF (BIKTARVY) 50-200-25 MG per tablet 1 tablet  Status:  Discontinued        1 tablet Oral Daily  12/12/19 1413 12/19/19 0840   11/17/19 1548  ceFAZolin (ANCEF) 2-4 GM/100ML-% IVPB       Note to Pharmacy: Domenick Bookbinder   : cabinet override      11/17/19 1548 11/18/19 0359   11/16/19 1515  ceFAZolin (ANCEF) IVPB 2g/100 mL premix        2 g 200 mL/hr over 30 Minutes Intravenous To Radiology 11/16/19 1511 11/17/19 1625   11/15/19 0900  sulfamethoxazole-trimethoprim (BACTRIM DS) 800-160 MG per tablet 1 tablet        1 tablet Per Tube Once per day on Mon Wed Fri 11/13/19 1906     11/13/19 1615  dolutegravir (TIVICAY) tablet 50 mg  Status:  Discontinued        50 mg Oral Daily 11/13/19 1521 12/12/19 1413   11/13/19 1615  emtricitabine-tenofovir AF (DESCOVY) 200-25 MG per tablet 1 tablet  Status:  Discontinued        1 tablet Per Tube Daily 11/13/19 1521 12/12/19 1413   11/13/19 1530  sulfamethoxazole-trimethoprim (BACTRIM DS) 800-160 MG per tablet 1 tablet  Status:  Discontinued        1 tablet Oral Once per day on Mon Wed Fri 11/13/19 1441 11/13/19 1906   11/13/19 1500  bictegravir-emtricitabine-tenofovir AF (BIKTARVY) 50-200-25 MG per tablet 1 tablet  Status:  Discontinued        1 tablet Oral Daily 11/13/19 1403 11/13/19 1521   11/10/19 1000  erythromycin 250 mg in sodium chloride 0.9 % 100 mL IVPB        250 mg 100 mL/hr over 60 Minutes Intravenous Every 8 hours 11/10/19 0907 11/11/19 2000   11/07/19 1200  ceFEPIme (MAXIPIME) 2 g in sodium chloride 0.9 % 100 mL IVPB        2 g 200 mL/hr over 30 Minutes Intravenous Every 8 hours 11/07/19 1013 11/13/19 2127   11/03/19 1200  cefTRIAXone (ROCEPHIN) 2 g in sodium chloride 0.9 % 100 mL IVPB        2 g 200 mL/hr over 30 Minutes Intravenous Every 24 hours 11/03/19 0922 11/05/19 1427   11/02/19 0800  vancomycin (VANCOREADY) IVPB 750 mg/150 mL  Status:  Discontinued        750 mg 150 mL/hr over 60 Minutes Intravenous Every 12 hours 11/01/19 1935 11/02/19 1105   11/02/19 0600  piperacillin-tazobactam (ZOSYN) IVPB 3.375 g  Status:  Discontinued         3.375 g 12.5 mL/hr over 240 Minutes Intravenous Every 8 hours 11/02/19 0311 11/03/19 0920   11/01/19 2200  ceFEPIme (MAXIPIME) 2 g in sodium chloride 0.9 % 100 mL IVPB  Status:  Discontinued        2 g 200 mL/hr over 30 Minutes Intravenous Every 12 hours 11/01/19 2143 11/02/19 0311   11/01/19 1915  vancomycin (VANCOREADY) IVPB 1500 mg/300 mL        1,500 mg 150 mL/hr over 120 Minutes Intravenous  Once 11/01/19 1909 11/01/19 2147   11/01/19 1845  cefTRIAXone (ROCEPHIN) 1 g in sodium chloride 0.9 % 100 mL IVPB        1 g 200 mL/hr over 30 Minutes Intravenous  Once 11/01/19 1842 11/01/19 2051   11/01/19 1845  azithromycin (ZITHROMAX) 500 mg in sodium chloride 0.9 % 250 mL IVPB        500 mg 250 mL/hr over 60 Minutes Intravenous  Once 11/01/19 1842 11/01/19 2051       Time spent: 20 minutes    [facility]  01/20/2020, 2:12 PM  LOS: 80 days

## 2020-01-21 LAB — GLUCOSE, CAPILLARY
Glucose-Capillary: 83 mg/dL (ref 70–99)
Glucose-Capillary: 161 mg/dL — ABNORMAL HIGH (ref 70–99)
Glucose-Capillary: 79 mg/dL (ref 70–99)
Glucose-Capillary: 136 mg/dL — ABNORMAL HIGH (ref 70–99)
Glucose-Capillary: 193 mg/dL — ABNORMAL HIGH (ref 70–99)
Glucose-Capillary: 182 mg/dL — ABNORMAL HIGH (ref 70–99)

## 2020-01-21 NOTE — Progress Notes (Signed)
[facility] PROGRESS NOTE  Ryan Orozco PPI:[id number] DOB: 01-11-1966 DOA: 11/01/2019 PCP: Default, Provider, MD   Status: Remains inpatient appropriate because:Altered mental status, Unsafe d/c plan and Inpatient level of care appropriate due to severity of illness. Patient newly diagnosed with anoxic brain injury  Dispo: The patient is from: Home              Anticipated d/c is to: SNF              Anticipated d/c date is: > 3 days              Patient currently is medically stable to d/c.Marland Kitchen  Barriers to discharge: No SNF bed offers.  Needs trach capable facility. TOC communicating with financial counseling team who have yet to hear back from DSS regarding patient's assigned caseworker.   Code Status: Full Family Communication: 12/15 dtr [name] by telephone DVT prophylaxis: Subcutaneous heparin Vaccination status: Covid vaccination status unknown  Foley catheter: No  HPI: 54 year old male with HIV, chronic alcohol abuse who was presented to the emergency department after being found unresponsive outside a convinient store.  EMS found him pulseless in PEA, unknown downtime, successfully resuscitated and brought to the emergency department.  UDS positive for THC/benzodiazepines.  CT head unremarkable.  Failed extubation trials due to severe anoxic brain injury and had tracheostomy placed.  PEG placed on 10/15.  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  This has led to significant pain as well.  [name] was consulted and recommendations/input/orders highly appreciated.  She may also benefit from Botox injections to both hips to aid in treatment of spasticity.   Significant Hospital Events   9/29 Admit post PEA arrest. UDS positive for THC, benzo's. ETOH 177 10/05 EEG ongoing. Versed restarted overnight due to agitation. Nicardipine increased.  10/06 Developed tachypnea with WUA, no follow  commands off sedation  10/07 Vomiting, TF held / restarted with recurrent vomiting 10/16 tracheostomy collar for 9 hours 10/18->10/19 off vent all night  10/20> TC 11/15 > downsized to #4 Shiley flex CFS  Subjective: Opens eyes on verbal command.  Makes incomprehensible sounds when his name is called.  Unable to follow motor commands.  Spontaneous movements of upper extremities seen.  Objective: Vitals:   01/21/20 0754 01/21/20 0903  BP: 110/70   Pulse: 87 90  Resp: 18 18  Temp:    SpO2:  93%    Intake/Output Summary (Last 24 hours) at 01/21/2020 1055 Last data filed at 01/21/2020 0553 Gross per 24 hour  Intake --  Output 1650 ml  Net -1650 ml   Filed Weights   01/16/20 0130 01/20/20 0500 01/21/20 0500  Weight: 71.9 kg 72 kg 74 kg    Exam: General: Alert, calm, no acute distress Pulmonary: Bilateral lung sounds coarse to auscultation with some expiratory rhonchi, decreased in the bases, trach tube with large volume thick green expectorated sputum around trach and trach dressing, FiO2 28% Cardiac: S1-S2, no JVD, no peripheral edema, no resting tachycardia, appropriate capillary refill Abdomen: Soft nontender nondistended, PEG tube in place with tube feeding infusing Neurological: Awake, attempts to vocalize but speech unintelligible- improved upper extremity hypertonicity and dystonic movements with purposeful movements -continues to exhibit hypertonicity of lower extremities/ persistent flexion at hips R>L   Assessment/Plan: Acute problems: Anoxic brain injury 2/2 PEA arrest/pain syndrome secondary to hypertonicity/spastic tetraplegia:  -Presumed 2/2 combination alcohol and BZD overdose with UDS positive for THC and BZDs -Do  not expect he will recover fully - long-term SNF placement recommended -Continue PT/OT/SLP -Continue baclofen, gabapentin, scheduled oxycodone and Zanaflex 4 mg QID -Appreciate assistance of [name] given to Botox next week to right  hamstrings, rectus femoris -Continue bilateral upper extremity WHO resting splints -Continue low-dose Seroquel for brain injury sequela -Continue scheduled Tylenol for pain -Continue CREA muscle rub -KREG rotational bed ordered  Acute hypoxemic respiratory failure/new tracheostomy tube requirement/Corynebacterium tracheobronchitis -Hypoxemia now resolved-trach tube placed 10/3 -Continue PMV training per SLP -#4 cuffless trach-capping trial at discretion of trach team -[place] team following.  Hesitation regarding decannulation until patient can reliably follow commands, cough and clear airway. -Sputum culture consistent with abundant Corynebacterium-begin Levaquin for 7 days-add Mucomyst nebs -continue physiotherapy with Vibra vest -Continue supervised PMV trials   Dysphagia 2/2 anoxic brain injury -Continue tube feeding per PEG -Repeat SLP evaluation on 11/23 with recommendation for n.p.o. status  Goals of care:  -Palliative care was involved during this hospitalization.   -Goals of care were discussed. remains full code.  - Palliative care recommended outpatient follow-up with palliative providers. -May need to revisit end-of-life goals of care since patient making poor progress and currently having significant pain related to hypertonicity from brain injury  Protein calorie malnutrition nutrition Status: Nutrition Problem: Increased nutrient needs Etiology: acute illness Signs/Symptoms: estimated needs Interventions: Tube feeding bolus doses of Osmolite, Juven twice daily, Prosource TF 45 mL 3 times daily Estimated body mass index is 26.33 kg/m as calculated from the following:   Height as of this encounter: 5' 6"  (1.676 m).   Weight as of this encounter: 74 kg.   Multiple decubiti not POA Wound / Incision (Open or Dehisced) 11/17/19 Puncture Abdomen Left;Anterior;Upper G-tube insertion site  (Active)  Date First Assessed/Time First Assessed: 11/17/19 1646   Wound Type:  Puncture  Location: Abdomen  Location Orientation: Left;Anterior;Upper  Wound Description (Comments): G-tube insertion site   Present on Admission: No    Assessments 11/17/2019  4:47 PM 01/20/2020  8:29 PM  Dressing Type Gauze (Comment);Tape dressing Gauze (Comment)  Dressing Changed New Other (Comment)  Dressing Status Clean;Dry;Intact Clean;Dry;Intact  Dressing Change Frequency PRN PRN  Site / Wound Assessment Clean;Dry;Pink --  Peri-wound Assessment Intact --  Closure None None  Drainage Amount None None  Treatment Cleansed Other (Comment)     No Linked orders to display    Other problems: Hypertension/grade 1 diastolic dysfunction:  -Continue amlodipine  -Echocardiogram this admission with preserved LVEF with evidence of grade   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   -Continue Keppra; levetiracetam level 35.9 on 11/12 -no further seizure activity since transitioned out of ICU setting therefore will discontinue seizure precaution  HIV:  -CD4 count of 124.  -Dc'd Tivicay and Descovy infavor of Biktarvy to for eventual dc to SNF -Continue prophylactic Bactrim  Inguinal hernias -Evaluated by general surgery this admission. Documented as chronically incarcerated. Patient has been clinically stable and tolerating tube feedings and having bowel movements so unless patient obstructed would not be considered a candidate for repair. Even if obstructed consulting surgeon was not sure he would be a candidate for hernia repair regardless.  Data Reviewed: Basic Metabolic Panel: Recent Labs  Lab 01/15/20 0924  NA 144  K 3.9  CL 105  CO2 27  GLUCOSE 98  BUN 14  CREATININE 0.76  CALCIUM 10.1   Liver Function Tests: Recent Labs  Lab 01/15/20 0924  AST 29  ALT 24  ALKPHOS 104  BILITOT 0.6  PROT 7.3  ALBUMIN 3.2*   No results for input(s): LIPASE, AMYLASE in the last 168 hours. No results for input(s): AMMONIA in the last 168 hours. CBC: Recent Labs   Lab 01/15/20 0924  WBC 3.0*  HGB 14.6  HCT 44.4  MCV 95.3  PLT 162   Cardiac Enzymes: No results for input(s): CKTOTAL, CKMB, CKMBINDEX, TROPONINI in the last 168 hours. BNP (last 3 results) No results for input(s): BNP in the last 8760 hours.  ProBNP (last 3 results) No results for input(s): PROBNP in the last 8760 hours.  CBG: Recent Labs  Lab 01/20/20 1655 01/20/20 2000 01/20/20 2335 01/21/20 0353 01/21/20 0857  GLUCAP 116* 170* 167* 83 161*    Recent Results (from the past 240 hour(s))  Culture, respiratory (non-expectorated)     Status: None   Collection Time: 01/16/20  9:30 AM   Specimen: Tracheal Aspirate; Respiratory  Result Value Ref Range Status   Specimen Description TRACHEAL ASPIRATE  Final   Special Requests NONE  Final   Gram Stain   Final    MODERATE WBC PRESENT,BOTH PMN AND MONONUCLEAR MODERATE GRAM POSITIVE RODS FEW GRAM NEGATIVE RODS    Culture   Final    ABUNDANT CORYNEBACTERIUM STRIATUM Standardized susceptibility testing for this organism is not available. Performed at [facility], [place] [address] [place], [place] [address]    Report Status 01/18/2020 FINAL  Final     Studies: No results found.  Scheduled Meds: . acetaminophen (TYLENOL) oral liquid 160 mg/5 mL  650 mg Per Tube Q6H  . acetylcysteine  4 mL Nebulization TID  . albuterol  2.5 mg Nebulization TID  . amLODipine  10 mg Per Tube Daily  . baclofen  30 mg Per Tube TID  . chlorhexidine gluconate (MEDLINE KIT)  15 mL Mouth Rinse BID  . Darunavir-Cobicisctat-Emtricitabine-Tenofovir Alafenamide  1 tablet Oral Q breakfast  . famotidine  10.4 mg Per Tube BID  . feeding supplement (OSMOLITE 1.5 CAL)  237 mL Per Tube Q24H  . feeding supplement (OSMOLITE 1.5 CAL)  474 mL Per Tube TID  . feeding supplement (PROSource TF)  45 mL Per Tube TID  . fiber  1 packet Per Tube BID  . folic acid  1 mg Per Tube Daily  . free water  200 mL Per Tube Q4H  . gabapentin  200 mg Per Tube Q8H   . heparin injection (subcutaneous)  5,000 Units Subcutaneous Q8H  . levETIRAcetam  1,500 mg Per Tube BID  . linezolid  600 mg Per Tube Q12H  . Muscle Rub   Topical QID  . nystatin   Topical TID  . oxyCODONE  5 mg Per Tube Q6H  . QUEtiapine  12.5 mg Per Tube QHS  . scopolamine  1 patch Transdermal Q72H  . sulfamethoxazole-trimethoprim  1 tablet Per Tube Once per day on Mon Wed Fri  . thiamine  100 mg Per Tube Daily  . 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[facility])   Dysphagia   Protein-calorie  malnutrition ([place])   Seizures ([place])   Bilateral recurrent inguinal hernia   Muscle spasticity   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[place])   Tracheobronchitis   Sequela of Corynebacterium infection   Consultants:  PCCM  Palliative medicine  General surgery  Neurology  Interventional radiology  Procedures:  EEG  Echocardiogram  Tracheostomy  Antibiotics: Anti-infectives (From admission, onward)   Start     Dose/Rate Route Frequency Ordered Stop   01/19/20 2200  linezolid (ZYVOX) tablet 600 mg        600 mg Per Tube Every 12 hours 01/19/20 1534 01/26/20 2159   01/19/20 1100  levofloxacin (LEVAQUIN) tablet 500 mg  Status:  Discontinued        500 mg Per Tube Daily 01/19/20 1004 01/19/20 1534   12/19/19 0930  Darunavir-Cobicisctat-Emtricitabine-Tenofovir Alafenamide (SYMTUZA) 800-150-200-10 MG TABS 1 tablet        1 tablet Oral Daily with breakfast 12/19/19 0840     12/13/19 1000  bictegravir-emtricitabine-tenofovir AF (BIKTARVY) 50-200-25 MG per tablet  1 tablet  Status:  Discontinued        1 tablet Oral Daily 12/12/19 1413 12/19/19 0840   11/17/19 1548  ceFAZolin (ANCEF) 2-4 GM/100ML-% IVPB       Note to Pharmacy: Domenick Bookbinder   : cabinet override      11/17/19 1548 11/18/19 0359   11/16/19 1515  ceFAZolin (ANCEF) IVPB 2g/100 mL premix        2 g 200 mL/hr over 30 Minutes Intravenous To Radiology 11/16/19 1511 11/17/19 1625   11/15/19 0900  sulfamethoxazole-trimethoprim (BACTRIM DS) 800-160 MG per tablet 1 tablet        1 tablet Per Tube Once per day on Mon Wed Fri 11/13/19 1906     11/13/19 1615  dolutegravir (TIVICAY) tablet 50 mg  Status:  Discontinued        50 mg Oral Daily 11/13/19 1521 12/12/19 1413   11/13/19 1615  emtricitabine-tenofovir AF (DESCOVY) 200-25 MG per tablet 1 tablet  Status:  Discontinued        1 tablet Per Tube Daily 11/13/19 1521 12/12/19 1413   11/13/19 1530  sulfamethoxazole-trimethoprim (BACTRIM DS) 800-160 MG per tablet 1 tablet  Status:  Discontinued        1 tablet Oral Once per day on Mon Wed Fri 11/13/19 1441 11/13/19 1906   11/13/19 1500  bictegravir-emtricitabine-tenofovir AF (BIKTARVY) 50-200-25 MG per tablet 1 tablet  Status:  Discontinued        1 tablet Oral Daily 11/13/19 1403 11/13/19 1521   11/10/19 1000  erythromycin 250 mg in sodium chloride 0.9 % 100 mL IVPB        250 mg 100 mL/hr over 60 Minutes Intravenous Every 8 hours 11/10/19 0907 11/11/19 2000   11/07/19 1200  ceFEPIme (MAXIPIME) 2 g in sodium chloride 0.9 % 100 mL IVPB        2 g 200 mL/hr over 30 Minutes Intravenous Every 8 hours 11/07/19 1013 11/13/19 2127   11/03/19 1200  cefTRIAXone (ROCEPHIN) 2 g in sodium chloride 0.9 % 100 mL IVPB        2 g 200 mL/hr over 30 Minutes Intravenous Every 24 hours 11/03/19 0922 11/05/19 1427   11/02/19 0800  vancomycin (VANCOREADY) IVPB 750 mg/150 mL  Status:  Discontinued        750 mg 150 mL/hr over 60 Minutes Intravenous Every 12 hours 11/01/19 1935 11/02/19 1105   11/02/19 0600   piperacillin-tazobactam (ZOSYN) IVPB 3.375 g  Status:  Discontinued        3.375 g 12.5 mL/hr over 240 Minutes Intravenous Every 8 hours 11/02/19 0311 11/03/19 0920   11/01/19 2200  ceFEPIme (MAXIPIME) 2 g in sodium chloride 0.9 % 100 mL IVPB  Status:  Discontinued        2 g 200 mL/hr over 30 Minutes Intravenous Every 12 hours 11/01/19 2143 11/02/19 0311   11/01/19 1915  vancomycin (VANCOREADY) IVPB 1500 mg/300 mL        1,500 mg 150 mL/hr over 120 Minutes Intravenous  Once 11/01/19 1909 11/01/19 2147   11/01/19 1845  cefTRIAXone (ROCEPHIN) 1 g in sodium chloride 0.9 % 100 mL IVPB        1 g 200 mL/hr over 30 Minutes Intravenous  Once 11/01/19 1842 11/01/19 2051   11/01/19 1845  azithromycin (ZITHROMAX) 500 mg in sodium chloride 0.9 % 250 mL IVPB        500 mg 250 mL/hr over 60 Minutes Intravenous  Once 11/01/19 1842 11/01/19 2051       Time spent: 20 minutes    [facility]  01/21/2020, 10:55 AM  LOS: 81 days

## 2020-01-21 NOTE — Consult Note (Addendum)
WOC Nurse Consult Note: Patient receiving care in [facility] 2W21 Consult completed remotely after review of chart. Reason for Consult: Leaking around GTube  Dressing procedure/placement/frequency: Clean around G Tube with NS. Insert 4 x 4 Drawtex dressing [place] # [phone]) for absorption and to prevent skin breakdown. Change PRN soiling.  Please consult with physician if leaking is extensive.   Thank you for the consult. WOC nurse will not follow at this time.   Please re-consult the WOC team if needed.  Renaldo Reel Katrinka Blazing, MSN, RN, CMSRN, Angus Seller, [facility] Wound Treatment Associate Pager [phone]

## 2020-01-22 DIAGNOSIS — J988 Other specified respiratory disorders: Secondary | ICD-10-CM

## 2020-01-22 LAB — GLUCOSE, CAPILLARY
Glucose-Capillary: 103 mg/dL — ABNORMAL HIGH (ref 70–99)
Glucose-Capillary: 92 mg/dL (ref 70–99)
Glucose-Capillary: 123 mg/dL — ABNORMAL HIGH (ref 70–99)
Glucose-Capillary: 95 mg/dL (ref 70–99)
Glucose-Capillary: 155 mg/dL — ABNORMAL HIGH (ref 70–99)
Glucose-Capillary: 133 mg/dL — ABNORMAL HIGH (ref 70–99)

## 2020-01-22 NOTE — Progress Notes (Addendum)
[facility] PROGRESS NOTE  Kaiven [place] WSF:[id number] DOB: 05/30/1965 DOA: 11/01/2019 PCP: Default, Provider, MD    11/16   Status: Remains inpatient appropriate because:Altered mental status, Unsafe d/c plan and Inpatient level of care appropriate due to severity of illness. Patient newly diagnosed with anoxic brain injury  Dispo: The patient is from: Home              Anticipated d/c is to: SNF              Anticipated d/c date is: > 3 days              Patient currently is medically stable to d/c.  3 years to discharge: No SNF bed offers.  Needs trach capable facility. TOC communicating with financial counseling team who have yet to hear back from DSS regarding patient's assigned caseworker.   Code Status: Full Family Communication: 12/15 dtr [name] by telephone DVT prophylaxis: Subcutaneous heparin Vaccination status: Covid vaccination status unknown  Foley catheter: No  HPI: 54 year old male with HIV, chronic alcohol abuse who was presented to the emergency department after being found unresponsive outside a convinient store.  EMS found him pulseless in PEA, unknown downtime, successfully resuscitated and brought to the emergency department.  UDS positive for THC/benzodiazepines.  CT head unremarkable.  Failed extubation trials due to severe anoxic brain injury and had tracheostomy placed.  PEG placed on 10/15.  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  This has led to significant pain as well.  [name] was consulted and recommendations/input/orders highly appreciated.  She may also benefit from Botox injections to both hips to aid in treatment of spasticity.   Significant Hospital Events   9/29 Admit post PEA arrest. UDS positive for THC, benzo's. ETOH 177 10/05 EEG ongoing. Versed restarted overnight due to agitation. Nicardipine increased.  10/06 Developed tachypnea with WUA, no  follow commands off sedation  10/07 Vomiting, TF held / restarted with recurrent vomiting 10/16 tracheostomy collar for 9 hours 10/18->10/19 off vent all night  10/20> TC 11/15 > downsized to #4 Shiley flex CFS  Subjective: Awakened.  Began smiling and moving spontaneously.  Attempts to vocalize but speech unintelligible  Objective: Vitals:   01/22/20 0401 01/22/20 0802  BP:    Pulse: 80 71  Resp: 17 16  Temp:    SpO2: 97% 93%    Intake/Output Summary (Last 24 hours) at 01/22/2020 0953 Last data filed at 01/22/2020 0600 Gross per 24 hour  Intake --  Output 1001 ml  Net -1001 ml   Filed Weights   01/20/20 0500 01/21/20 0500 01/22/20 0412  Weight: 72 kg 74 kg 72 kg    Exam: General: Awake, affect improved when head of bed elevated to 90 degrees.  Began to smile. Pulmonary: Anterior lung sounds coarse to auscultation without obvious wheezing or rhonchi anteriorly.  FiO2 21%, marked decrease in secretions from trach. Cardiac: Heart sounds S1-S2 without murmurs, pulses regular, no JVD and no peripheral edema. Abdomen: Soft nontender nondistended, PEG tube in place with tube feeding infusing-insertion site unremarkable without insertion site drainage, erythema or redness. Neurological: Awake, verbalized our oh earlier today during exam.  Improved upper extremity hypertonicity and dystonic movements with purposeful movements -continues to exhibit hypertonicity of lower extremities/ persistent flexion at hips-hypertonicity right greater than left with upper and lower extremity   Assessment/Plan: Acute problems: Anoxic brain injury 2/2 PEA arrest/pain syndrome secondary to hypertonicity/spastic tetraplegia:  -Presumed 2/2  combination alcohol and BZD overdose with UDS positive for THC and BZDs -Do not expect he will recover fully - long-term SNF placement recommended -Continue PT/OT/SLP -Continue baclofen, gabapentin, scheduled oxycodone and Zanaflex 4 mg QID -Appreciate  assistance of [name] given to Botox next week to right hamstrings, rectus femoris -Continue bilateral upper extremity WHO resting splints -12/21 begin PROM per nursing staff every 4 hours -Continue low-dose Seroquel for brain injury sequela -Continue scheduled Tylenol for pain -Continue CREA muscle rub -KREG rotational bed ordered  Acute hypoxemic respiratory failure/new tracheostomy tube requirement/Corynebacterium tracheobronchitis -Hypoxemia now resolved-trach tube placed 10/3 -Continue PMV training per SLP -#4 cuffless trach-capping trial at discretion of trach team -[place] team following.  Hesitation regarding decannulation until patient can reliably follow commands, cough and clear airway. -Sputum culture consistent with abundant Corynebacterium-continue linezolid for a total of 7 days-add Mucomyst nebs -continue physiotherapy with Vibra vest along with supervised PMV trials  Dysphagia 2/2 anoxic brain injury -Continue tube feeding per PEG -Repeat SLP evaluation on 11/23 with recommendation for n.p.o. status  Goals of care:  -Palliative care was involved during this hospitalization.   -Goals of care were discussed. remains full code.  - Palliative care recommended outpatient follow-up with palliative providers. -May need to revisit end-of-life goals of care since patient making poor progress and currently having significant pain related to hypertonicity from brain injury  Protein calorie malnutrition nutrition Status: Nutrition Problem: Increased nutrient needs Etiology: acute illness Signs/Symptoms: estimated needs Interventions: Tube feeding bolus doses of Osmolite, Juven twice daily, Prosource TF 45 mL 3 times daily Estimated body mass index is 25.61 kg/m as calculated from the following:   Height as of this encounter: 5' 6"  (1.676 m).   Weight as of this encounter: 72 kg.   Multiple decubiti not POA Wound / Incision (Open or Dehisced) 11/17/19 Puncture  Abdomen Left;Anterior;Upper G-tube insertion site  (Active)  Date First Assessed/Time First Assessed: 11/17/19 1646   Wound Type: Puncture  Location: Abdomen  Location Orientation: Left;Anterior;Upper  Wound Description (Comments): G-tube insertion site   Present on Admission: No    Assessments 11/17/2019  4:47 PM 01/21/2020  8:00 PM  Dressing Type Gauze (Comment);Tape dressing Gauze (Comment)  Dressing Changed New Other (Comment)  Dressing Status Clean;Dry;Intact Clean;Dry  Dressing Change Frequency PRN PRN  Site / Wound Assessment Clean;Dry;Pink Dry;Clean  Peri-wound Assessment Intact --  Closure None --  Drainage Amount None --  Treatment Cleansed --     No Linked orders to display      Other problems: Hypertension/grade 1 diastolic dysfunction:  -Continue amlodipine  -Echocardiogram this admission with preserved LVEF with evidence of grade   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   -Continue Keppra; levetiracetam level 35.9 on 11/12 -no further seizure activity since transitioned out of ICU setting therefore will discontinue seizure precaution  HIV:  -CD4 count of 124 October 2021 (had been as high as 250 Dec 2021) -Dc'd Tivicay and Descovy infavor of Biktarvy to for eventual dc to SNF -Continue prophylactic Bactrim  Inguinal hernias -Evaluated by general surgery this admission. Documented as chronically incarcerated. Patient has been clinically stable and tolerating tube feedings and having bowel movements so unless patient obstructed would not be considered a candidate for repair. Even if obstructed consulting surgeon was not sure he would be a candidate for hernia repair regardless .   Data Reviewed: Basic Metabolic Panel: No results for input(s): NA, K, CL, CO2, GLUCOSE, BUN, CREATININE, CALCIUM, MG, PHOS in the last  168 hours. Liver Function Tests: No results for input(s): AST, ALT, ALKPHOS, BILITOT, PROT, ALBUMIN in the last 168 hours. No  results for input(s): LIPASE, AMYLASE in the last 168 hours. No results for input(s): AMMONIA in the last 168 hours. CBC: No results for input(s): WBC, NEUTROABS, HGB, HCT, MCV, PLT in the last 168 hours. Cardiac Enzymes: No results for input(s): CKTOTAL, CKMB, CKMBINDEX, TROPONINI in the last 168 hours. BNP (last 3 results) No results for input(s): BNP in the last 8760 hours.  ProBNP (last 3 results) No results for input(s): PROBNP in the last 8760 hours.  CBG: Recent Labs  Lab 01/21/20 1631 01/21/20 1937 01/21/20 2305 01/22/20 0443 01/22/20 0726  GLUCAP 136* 193* 182* 103* 92    Recent Results (from the past 240 hour(s))  Culture, respiratory (non-expectorated)     Status: None   Collection Time: 01/16/20  9:30 AM   Specimen: Tracheal Aspirate; Respiratory  Result Value Ref Range Status   Specimen Description TRACHEAL ASPIRATE  Final   Special Requests NONE  Final   Gram Stain   Final    MODERATE WBC PRESENT,BOTH PMN AND MONONUCLEAR MODERATE GRAM POSITIVE RODS FEW GRAM NEGATIVE RODS    Culture   Final    ABUNDANT CORYNEBACTERIUM STRIATUM Standardized susceptibility testing for this organism is not available. Performed at [facility], [place] [address], [place], [place] [address]    Report Status 01/18/2020 FINAL  Final     Studies: No results found.  Scheduled Meds: . acetaminophen (TYLENOL) oral liquid 160 mg/5 mL  650 mg Per Tube Q6H  . acetylcysteine  4 mL Nebulization TID  . albuterol  2.5 mg Nebulization TID  . amLODipine  10 mg Per Tube Daily  . baclofen  30 mg Per Tube TID  . chlorhexidine gluconate (MEDLINE KIT)  15 mL Mouth Rinse BID  . Darunavir-Cobicisctat-Emtricitabine-Tenofovir Alafenamide  1 tablet Oral Q breakfast  . famotidine  10.4 mg Per Tube BID  . feeding supplement (OSMOLITE 1.5 CAL)  237 mL Per Tube Q24H  . feeding supplement (OSMOLITE 1.5 CAL)  474 mL Per Tube TID  . feeding supplement (PROSource TF)  45 mL Per Tube TID  . fiber   1 packet Per Tube BID  . folic acid  1 mg Per Tube Daily  . free water  200 mL Per Tube Q4H  . gabapentin  200 mg Per Tube Q8H  . heparin injection (subcutaneous)  5,000 Units Subcutaneous Q8H  . levETIRAcetam  1,500 mg Per Tube BID  . linezolid  600 mg Per Tube Q12H  . Muscle Rub   Topical QID  . oxyCODONE  5 mg Per Tube Q6H  . QUEtiapine  12.5 mg Per Tube QHS  . scopolamine  1 patch Transdermal Q72H  . sulfamethoxazole-trimethoprim  1 tablet Per Tube Once per day on Mon Wed Fri  . thiamine  100 mg Per Tube Daily  . tiZANidine  4 mg Per Tube QID   Continuous Infusions:   Active Problems:   Cardiac arrest [facility])   Community acquired pneumonia of left upper lobe of lung   Anoxic brain injury ([place])   Alcohol abuse   Acute respiratory failure with hypoxia ([facility])   Vomiting   Pressure injury of skin   Small bowel obstruction ([facility])   Palliative care encounter   Bowel obstruction (HCC)   Chronic respiratory failure (H[facility])   Diastolic dysfunction   Tracheostomy dependent ([place])   Dysphagia   Protein-calorie malnutrition ([place])  Seizures ([place])   Bilateral recurrent inguinal hernia   Muscle spasticity   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[place])   Tracheobronchitis   Sequela of Corynebacterium infection   Consultants:  PCCM  Palliative medicine  General surgery  Neurology  Interventional radiology  Procedures:  EEG  Echocardiogram  Tracheostomy  Antibiotics: Anti-infectives (From admission, onward)   Start     Dose/Rate Route Frequency Ordered Stop   01/19/20 2200  linezolid (ZYVOX) tablet 600 mg        600 mg Per Tube Every 12 hours 01/19/20 1534 01/26/20 2159   01/19/20 1100  levofloxacin (LEVAQUIN) tablet 500 mg  Status:  Discontinued        500 mg Per Tube Daily 01/19/20 1004 01/19/20 1534   12/19/19 0930  Darunavir-Cobicisctat-Emtricitabine-Tenofovir Alafenamide (SYMTUZA) 800-150-200-10 MG TABS 1 tablet        1 tablet Oral Daily with breakfast  12/19/19 0840     12/13/19 1000  bictegravir-emtricitabine-tenofovir AF (BIKTARVY) 50-200-25 MG per tablet 1 tablet  Status:  Discontinued        1 tablet Oral Daily 12/12/19 1413 12/19/19 0840   11/17/19 1548  ceFAZolin (ANCEF) 2-4 GM/100ML-% IVPB       Note to Pharmacy: Domenick Bookbinder   : cabinet override      11/17/19 1548 11/18/19 0359   11/16/19 1515  ceFAZolin (ANCEF) IVPB 2g/100 mL premix        2 g 200 mL/hr over 30 Minutes Intravenous To Radiology 11/16/19 1511 11/17/19 1625   11/15/19 0900  sulfamethoxazole-trimethoprim (BACTRIM DS) 800-160 MG per tablet 1 tablet        1 tablet Per Tube Once per day on Mon Wed Fri 11/13/19 1906     11/13/19 1615  dolutegravir (TIVICAY) tablet 50 mg  Status:  Discontinued        50 mg Oral Daily 11/13/19 1521 12/12/19 1413   11/13/19 1615  emtricitabine-tenofovir AF (DESCOVY) 200-25 MG per tablet 1 tablet  Status:  Discontinued        1 tablet Per Tube Daily 11/13/19 1521 12/12/19 1413   11/13/19 1530  sulfamethoxazole-trimethoprim (BACTRIM DS) 800-160 MG per tablet 1 tablet  Status:  Discontinued        1 tablet Oral Once per day on Mon Wed Fri 11/13/19 1441 11/13/19 1906   11/13/19 1500  bictegravir-emtricitabine-tenofovir AF (BIKTARVY) 50-200-25 MG per tablet 1 tablet  Status:  Discontinued        1 tablet Oral Daily 11/13/19 1403 11/13/19 1521   11/10/19 1000  erythromycin 250 mg in sodium chloride 0.9 % 100 mL IVPB        250 mg 100 mL/hr over 60 Minutes Intravenous Every 8 hours 11/10/19 0907 11/11/19 2000   11/07/19 1200  ceFEPIme (MAXIPIME) 2 g in sodium chloride 0.9 % 100 mL IVPB        2 g 200 mL/hr over 30 Minutes Intravenous Every 8 hours 11/07/19 1013 11/13/19 2127   11/03/19 1200  cefTRIAXone (ROCEPHIN) 2 g in sodium chloride 0.9 % 100 mL IVPB        2 g 200 mL/hr over 30 Minutes Intravenous Every 24 hours 11/03/19 0922 11/05/19 1427   11/02/19 0800  vancomycin (VANCOREADY) IVPB 750 mg/150 mL  Status:  Discontinued        750  mg 150 mL/hr over 60 Minutes Intravenous Every 12 hours 11/01/19 1935 11/02/19 1105   11/02/19 0600  piperacillin-tazobactam (ZOSYN) IVPB 3.375 g  Status:  Discontinued  3.375 g 12.5 mL/hr over 240 Minutes Intravenous Every 8 hours 11/02/19 0311 11/03/19 0920   11/01/19 2200  ceFEPIme (MAXIPIME) 2 g in sodium chloride 0.9 % 100 mL IVPB  Status:  Discontinued        2 g 200 mL/hr over 30 Minutes Intravenous Every 12 hours 11/01/19 2143 11/02/19 0311   11/01/19 1915  vancomycin (VANCOREADY) IVPB 1500 mg/300 mL        1,500 mg 150 mL/hr over 120 Minutes Intravenous  Once 11/01/19 1909 11/01/19 2147   11/01/19 1845  cefTRIAXone (ROCEPHIN) 1 g in sodium chloride 0.9 % 100 mL IVPB        1 g 200 mL/hr over 30 Minutes Intravenous  Once 11/01/19 1842 11/01/19 2051   11/01/19 1845  azithromycin (ZITHROMAX) 500 mg in sodium chloride 0.9 % 250 mL IVPB        500 mg 250 mL/hr over 60 Minutes Intravenous  Once 11/01/19 1842 11/01/19 2051       Time spent: 20 minutes    Erin Hearing ANP  [facility]  Pager [phone].  01/22/2020, 9:53 AM  LOS: 82 days

## 2020-01-22 NOTE — Progress Notes (Signed)
NAMEJorell Orozco, MRN:  [id number], DOB:  [birth year], LOS: 82 ADMISSION DATE:  11/01/2019, CONSULTATION DATE:  9/29 REFERRING MD:  EDP, CHIEF COMPLAINT:  Cardiac arrest   Brief History   54 y/o male with HIV found down outside a convenience store on 9/27, received out of hospital CPR.  After ICU admission has had acute encephalopathy, concern for anoxic brain injury.  Received a tracheostomy 10/13.    Past Medical History  ETOH Abuse HIV - dx 1995  Significant Hospital Events   9/29 Admit post PEA arrest.  UDS positive for THC, benzo's.  ETOH 177 10/05 EEG ongoing. Versed restarted overnight due to agitation. Nicardipine increased.  10/06 Developed tachypnea with WUA, no follow commands off sedation  10/07 Vomiting, TF held / restarted with recurrent vomiting 10/16 tracheostomy collar for 9 hours 10/18 Off vent all night  10/20 ATC 11/15 Trach changed to #4 shiley cuffless 11/22 start capping trials  Consults:  Neurology  Procedures:  ETT 9/29 >>10/13 Tracheostomy 10/3 >>   Significant Diagnostic Tests:  CT head 9/29 >  No evidence of acute intracranial abnormality. Moderate generalized cerebral atrophy, advanced for age. Chronic medially displaced fracture deformity of the right lamina papyracea. Mild ethmoid and maxillary sinus mucosal thickening.  CT cervical spine 9/29 > No evidence of acute fracture to the cervical spine. Partially imaged airspace disease within the imaged lung apices (extensive on the left). Clinical correlation is recommended. 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  EEG 9/29 > Patient was noted to have frequent episodes of axial jerking with eye opening. Concomitant EEG showed generalized polyspikes consistent with myoclonic seizures. EEG also showed continuous generalized background suppression. EEG was not reactive to tactile stimulation. Hyperventilation and photic  stimulation were not performed  ECHO 9/30 > No evidence of wall motion abnormality  MRI 10/3 > Mild DWI hyperintensity involving the caudate and possibly putamen bilaterally. Additionally, possible subtle FLAIR hyperintensity involving the cerebellum, cortex diffusely, and bilateral basal ganglia. Although not diagnostic, these findings could be seen with early hypoxic/ischemic brain injury in this patient status post PEA arrest.   EEG 10/08 > severe diffuse encephalopathy likely related to anoxic/hypoxic brain injury  Testicular US 10/10 > showed large left hydrocele and left inguinal hernia. No evidence of testicular mass.  MRI brain 10/11 >> evolving hypoxic, anoxic brain injury compared to previous MRI   CT abdomen pelvis 10/12 >>  Right inguinal hernia and partial small bowel obstruction, large left inguinal hernia  Abdominal x-ray: Small bowel obstruction   Micro Data:  BCx2 9/29 >> negative  Urine 9/29 >> UA few bacteria, WBC, RBC, CaOxalate, and Mucus Covid, Flu A/B 9/29 >> negative Tracheal aspirate 10/5 >> normal flora   Antimicrobials:  Vancomycin 9/29 >> 9/30 Cefepime 9/29 >> 9/30 Cefepime 10/5 >> 10/11  Interim history/subjective:  Remains on ATC 21% Thick secretions from trach but pt able to clear Afebrile / no WBC  Objective   Blood pressure 123/80, pulse 71, temperature 98 F (36.7 C), resp. rate 16, height 5\' 6"  (1.676 m), weight 72 kg, SpO2 93 %.    FiO2 (%):  [21 %] 21 %   Intake/Output Summary (Last 24 hours) at 01/22/2020 1034 Last data filed at 01/22/2020 0600 Gross per 24 hour  Intake --  Output 1001 ml  Net -1001 ml   Filed Weights   01/20/20 0500 01/21/20 0500 01/22/20 0412  Weight: 72 kg 74  kg 72 kg   Physical Exam: General: adult male lying in bed in NAD  HEENT: MM pink/moist, trach midline c/d/i, thick yellow secretions noted in inner cannula / pt able to cough to clear  Neuro: Awakens / alert, not oriented, moves uppers  spontaneously   CV: s1s2 RRR, no m/r/g PULM: non-labored on ATC, lungs bilaterally clear  GI: soft, bsx4 active  Extremities: warm/dry, no edema  Skin: no rashes or lesions  Resolved Hospital Problem list   Partial small bowel obstruction > resolved  Assessment & Plan:  54 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 S/p trach due to failure to wean from ventilator.  Tracheostomy in place for airway protection Anoxic brain injury secondary to cardiac arrest c/b myoclonic seizures P: -ATC as tolerated, will need humidity given trach secretions  -would not decannulate until he can reliably follow commands, cough / clear airway  -pulmonary hygiene  -follow tracheal secretions  -trach care per protocol  -PT efforts   PCCM will continue to follow weekly.  Please call sooner if new needs arise.   Canary Brim, MSN, NP-C, AGACNP-BC [facility] 01/22/2020, 10:37 AM   Please see [email or web address] for pager details.

## 2020-01-22 NOTE — Plan of Care (Signed)
  Problem: Education: Goal: Knowledge of General Education information will improve Description: Including pain rating scale, medication(s)/side effects and non-pharmacologic comfort measures Outcome: Progressing   Problem: Health Behavior/Discharge Planning: Goal: Ability to manage health-related needs will improve Outcome: Progressing   Problem: Clinical Measurements: Goal: Ability to maintain clinical measurements within normal limits will improve Outcome: Progressing Goal: Will remain free from infection Outcome: Progressing Goal: Diagnostic test results will improve Outcome: Progressing Goal: Respiratory complications will improve Outcome: Progressing Goal: Cardiovascular complication will be avoided Outcome: Progressing   Problem: Activity: Goal: Risk for activity intolerance will decrease Outcome: Progressing   Problem: Nutrition: Goal: Adequate nutrition will be maintained Outcome: Progressing   Problem: Coping: Goal: Level of anxiety will decrease Outcome: Progressing   Problem: Elimination: Goal: Will not experience complications related to bowel motility Outcome: Progressing Goal: Will not experience complications related to urinary retention Outcome: Progressing   Problem: Pain Managment: Goal: General experience of comfort will improve Outcome: Progressing   Problem: Safety: Goal: Ability to remain free from injury will improve Outcome: Progressing   Problem: Skin Integrity: Goal: Risk for impaired skin integrity will decrease Outcome: Progressing   Problem: Education: Goal: 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 Outcome: Progressing   Problem: Activity: Goal: Ability to tolerate increased activity will improve Outcome: Progressing   Problem: Health Behavior/Discharge Planning: Goal: 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 Outcome: Progressing   Problem: Respiratory: Goal: Patent airway maintenance will  improve Outcome: Progressing   Problem: Role Relationship: Goal: Ability to communicate will improve Outcome: Progressing   Problem: Education: Goal: Knowledge of the prescribed therapeutic regimen Outcome: Progressing Goal: Knowledge of disease or condition will improve Outcome: Progressing   Problem: Clinical Measurements: Goal: Neurologic status will improve Outcome: Progressing   Problem: Tissue Perfusion: Goal: Ability to maintain intracranial pressure will improve Outcome: Progressing   Problem: Respiratory: Goal: Will regain and/or maintain adequate ventilation Outcome: Progressing   Problem: Skin Integrity: Goal: Risk for impaired skin integrity will decrease Outcome: Progressing Goal: Demonstration of wound healing without infection will improve Outcome: Progressing   Problem: Psychosocial: Goal: Ability to verbalize positive feelings about self will improve Outcome: Progressing Goal: Ability to participate in self-care as condition permits will improve Outcome: Progressing Goal: Ability to identify appropriate support needs will improve Outcome: Progressing   Problem: Health Behavior/Discharge Planning: Goal: Ability to manage health-related needs will improve Outcome: Progressing   Problem: Nutritional: Goal: Risk of aspiration will decrease Outcome: Progressing Goal: Dietary intake will improve Outcome: Progressing   Problem: Communication: Goal: Ability to communicate needs accurately will improve Outcome: Progressing   

## 2020-01-22 NOTE — Progress Notes (Signed)
  Speech Language Pathology Treatment: Dysphagia;Passy Muir Speaking valve  Patient Details Name: Ryan Orozco MRN: [id number] DOB: 03-09-1965 Today's Date: 01/22/2020 Time: 9562-1308 SLP Time Calculation (min) (ACUTE ONLY): 23 min  Assessment / Plan / Recommendation Clinical Impression  Pt was seen for dysphagia treatment. He was alert and cooperative. However, he appeared fatigued as evidenced by intermittent yawning, closing of eyes, and increased difficulty attending to visual stimuli during this session. Cognitive-linguistic tasks could not be completed during this session due to pt's difficulty with attention during this session. PMSV was donned and pt tolerated with for the during of the session with stable vitals and no evidence of respiratory distress. Oral care was provided and pt tolerated ice chips and 1/2 tsp boluses of puree solids without overt s/sx of aspiration. Oral phase appeared prolonged considering time of bolus presentation compared to that of bolus hyolaryngeal elevation. Anterior spillage was noted with liquids and labial stripping was reduced with purees. Cueing was intermittently needed for labial stripping. Mastication was limited and ineffective with the bolus falling out of his mouth and ultimately removed. Coughing was noted with thin liquids via tsp, suggesting aspiration. Pt's NPO status will be maintained at this time, but SLP will continue dysphagia and cognitive-linguistic treatment with hopes of him completing an instrumental swallowing assessment in the future.    HPI HPI: 54 y/o male with hx of HIV, ETOH abuse found down outside a convenience store on 9/27, received out of hospital CPR. Presents with acute hypoxic respiratory failure s/p cardiac arrest, acute encephalopathy due to hypoxic brain injury. Intubated 9/27-10/13. Received a tracheostomy 10/13, treachestomy collar trial for 9 hours on 10/16. CXR on 10/17 improving and shows left lung opacities have  essentially resolved      SLP Plan  Continue with current plan of care       Recommendations  Diet recommendations: NPO Medication Administration: Via alternative means      Patient may use Passy-Muir Speech Valve: During all therapies with supervision PMSV Supervision: Full         Oral Care Recommendations: Oral care QID Follow up Recommendations: Skilled Nursing facility SLP Visit Diagnosis: Cognitive communication deficit (R41.841);Aphonia (R49.1);Dysphagia, unspecified (R13.10) Plan: Continue with current plan of care       Fionna Merriott I. Vear Clock, MS, CCC-SLP Acute Rehabilitation Services Office number [phone] Pager [phone]                Scheryl Marten 01/22/2020, 5:14 PM

## 2020-01-22 NOTE — Progress Notes (Signed)
Trach changes will be every 4 weeks per CCM. Janina Mayo change will be due on 02/05/19.

## 2020-01-23 LAB — GLUCOSE, CAPILLARY
Glucose-Capillary: 101 mg/dL — ABNORMAL HIGH (ref 70–99)
Glucose-Capillary: 126 mg/dL — ABNORMAL HIGH (ref 70–99)
Glucose-Capillary: 78 mg/dL (ref 70–99)
Glucose-Capillary: 70 mg/dL (ref 70–99)
Glucose-Capillary: 191 mg/dL — ABNORMAL HIGH (ref 70–99)
Glucose-Capillary: 182 mg/dL — ABNORMAL HIGH (ref 70–99)
Glucose-Capillary: 107 mg/dL — ABNORMAL HIGH (ref 70–99)

## 2020-01-23 MED ORDER — OXYCODONE HCL 5 MG/5ML PO SOLN
5.0000 mg | Freq: Three times a day (TID) | ORAL | Status: DC
  Administered 2020-01-23 – 2020-01-30 (×21): 5 mg
  Filled 2020-01-23 (×22): qty 5

## 2020-01-23 MED ORDER — ALBUTEROL SULFATE (2.5 MG/3ML) 0.083% IN NEBU: 2.5000 mg | INHALATION_SOLUTION | Freq: Four times a day (QID) | RESPIRATORY_TRACT | Status: DC | PRN

## 2020-01-23 NOTE — Progress Notes (Signed)
CRITICAL VALUE ALERT  Critical Value:  CBG 70  Date & Time Notied:  01/23/20  Provider Notified:P. Lucianne Muss Md   Orders Received/Actions taken: Give of orange juice in g tube and will reassess in 30 min. Pt has no IV access

## 2020-01-23 NOTE — Plan of Care (Signed)
  Problem: Education: Goal: Knowledge of General Education information will improve Description: Including pain rating scale, medication(s)/side effects and non-pharmacologic comfort measures Outcome: Progressing   Problem: Health Behavior/Discharge Planning: Goal: Ability to manage health-related needs will improve Outcome: Progressing   Problem: Clinical Measurements: Goal: Ability to maintain clinical measurements within normal limits will improve Outcome: Progressing Goal: Will remain free from infection Outcome: Progressing Goal: Diagnostic test results will improve Outcome: Progressing Goal: Respiratory complications will improve Outcome: Progressing Goal: Cardiovascular complication will be avoided Outcome: Progressing   Problem: Activity: Goal: Risk for activity intolerance will decrease Outcome: Progressing   Problem: Nutrition: Goal: Adequate nutrition will be maintained Outcome: Progressing   Problem: Coping: Goal: Level of anxiety will decrease Outcome: Progressing   Problem: Elimination: Goal: Will not experience complications related to bowel motility Outcome: Progressing Goal: Will not experience complications related to urinary retention Outcome: Progressing   Problem: Pain Managment: Goal: General experience of comfort will improve Outcome: Progressing   Problem: Safety: Goal: Ability to remain free from injury will improve Outcome: Progressing   Problem: Skin Integrity: Goal: Risk for impaired skin integrity will decrease Outcome: Progressing   Problem: Education: Goal: 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 Outcome: Progressing   Problem: Activity: Goal: Ability to tolerate increased activity will improve Outcome: Progressing   Problem: Health Behavior/Discharge Planning: Goal: 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 Outcome: Progressing   Problem: Respiratory: Goal: Patent airway maintenance will  improve Outcome: Progressing   Problem: Role Relationship: Goal: Ability to communicate will improve Outcome: Progressing   Problem: Education: Goal: Knowledge of the prescribed therapeutic regimen Outcome: Progressing Goal: Knowledge of disease or condition will improve Outcome: Progressing   Problem: Clinical Measurements: Goal: Neurologic status will improve Outcome: Progressing   Problem: Tissue Perfusion: Goal: Ability to maintain intracranial pressure will improve Outcome: Progressing   Problem: Respiratory: Goal: Will regain and/or maintain adequate ventilation Outcome: Progressing   Problem: Skin Integrity: Goal: Risk for impaired skin integrity will decrease Outcome: Progressing Goal: Demonstration of wound healing without infection will improve Outcome: Progressing   Problem: Psychosocial: Goal: Ability to verbalize positive feelings about self will improve Outcome: Progressing Goal: Ability to participate in self-care as condition permits will improve Outcome: Progressing Goal: Ability to identify appropriate support needs will improve Outcome: Progressing   Problem: Health Behavior/Discharge Planning: Goal: Ability to manage health-related needs will improve Outcome: Progressing   Problem: Nutritional: Goal: Risk of aspiration will decrease Outcome: Progressing Goal: Dietary intake will improve Outcome: Progressing   Problem: Communication: Goal: Ability to communicate needs accurately will improve Outcome: Progressing   

## 2020-01-23 NOTE — Progress Notes (Signed)
  Speech Language Pathology Treatment: [name] Speaking valve;Dysphagia  Patient Details Name: Ryan Orozco MRN: [id number] DOB: [birth year] Today's Date: 01/23/2020 Time: 3532-9924 SLP Time Calculation (min) (ACUTE ONLY): 34 min  Assessment / Plan / Recommendation Clinical Impression  SLP followed up for PMSV and dysphagia intervention. Pt with recent trach downsize to [facility] size 4 cuffless on trach collar. Per nursing, pt not requiring frequent suction. PMSV placed with audible phonation though verbal output limit despite cueing. Pt utilized PMSV during SLP session without clinical difficulty. Recommend PMSV during wake hours as tolerated, handed off to nursing.  Pt repositioned upright, performed oral care. Pt consumed ice chip by teaspoon, puree, and thin via teaspoon. Pt with reduced labial seal and frequent anterior spillage. Oral readiness varied, though was aided by verbal and tactile cues. Oral residuals noted with ice chips and puree. No overt s/sx of aspiration. Swallow initiation suspected to be delayed per palpation. Anticipate pt will soon be ready for instrumental assessment further assess swallow safety and efficiency.   HPI HPI: 54 y/o male with hx of HIV, ETOH abuse found down outside a convenience store on 9/27, received out of hospital CPR. Presents with acute hypoxic respiratory failure s/p cardiac arrest, acute encephalopathy due to hypoxic brain injury. Intubated 9/27-10/13. Received a tracheostomy 10/13, treachestomy collar trial for 9 hours on 10/16. CXR on 10/17 improving and shows left lung opacities have essentially resolved      SLP Plan  Continue with current plan of care       Recommendations  Diet recommendations: NPO Medication Administration: Via alternative means      Patient may use Passy-Muir Speech Valve: During all waking hours (remove during sleep) PMSV Supervision: Intermittent         Oral Care Recommendations: Oral care QID Follow up  Recommendations: Skilled Nursing facility SLP Visit Diagnosis: Cognitive communication deficit (R41.841);Dysphagia, unspecified (R13.10) Plan: Continue with current plan of care       GO                [name] E Moesha Sarchet MA, CCC-SLP Acute Rehabilitation Services  01/23/2020, 2:51 PM

## 2020-01-23 NOTE — Progress Notes (Signed)
Physical Therapy Treatment Patient Details Name: Ryan Orozco MRN: [id number] DOB: September 17, 1965 Today's Date: 01/23/2020    History of Present Illness 54 year old male found down outside convenience store 9/27. Pt with PEA and hypoxic brain injury. Trach 10/13, PEG 10/15. PMHx: HIV AIDS and alcohol abuse    PT Comments    Patient received in bed. Alert. Patient found to be soiled with BM upon removing blankets. Required total assist +2 for rolling in bed for peri care and bed linen change. Patient requires total +2 assist for supine><sit and to maintain sitting balance at edge of bed. Patient has increased flexor tone in B LEs with mobility. R>L. Due to poor sitting balance and increased tone/spasticity it is unsafe to leave patient up in recliner unsupervised. He will continue to benefit from skilled PT while here to improve activity tolerance.         Follow Up Recommendations  SNF     Equipment Recommendations  Other (comment) (TBD at next venue)    Recommendations for Other Services       Precautions / Restrictions Precautions Precautions: Fall Precaution Comments: trach, PEG, anoxic, flexor tone R>L LE Restrictions Weight Bearing Restrictions: No    Mobility  Bed Mobility Overal bed mobility: Needs Assistance Bed Mobility: Supine to Sit;Sit to Supine;Rolling Rolling: Total assist;+2 for physical assistance   Supine to sit: Total assist;+2 for physical assistance Sit to supine: Total assist;+2 for physical assistance   General bed mobility comments: Patient has increased spacticity in RLE>LLE with mobility. Does not make attempts to assist with bed mobility. Requires total assist.  Transfers                 General transfer comment: not attempted  Ambulation/Gait             General Gait Details: unable   Stairs             Wheelchair Mobility    Modified Rankin (Stroke Patients Only)       Balance Overall balance assessment:  Needs assistance Sitting-balance support: Feet unsupported;Bilateral upper extremity supported Sitting balance-Leahy Scale: Zero Sitting balance - Comments: Total A. Unable to maintain sitting balance without assist. Will lean forward with cues and lift head up with cues. Postural control: Posterior lean;Right lateral lean                                  Cognition Arousal/Alertness: Awake/alert Behavior During Therapy: WFL for tasks assessed/performed Overall Cognitive Status: Impaired/Different from baseline Area of Impairment: Attention;Following commands;Safety/judgement;Problem solving                 Orientation Level: Disoriented to;Place;Time;Situation Current Attention Level: Focused Memory: Decreased short-term memory Following Commands: Follows one step commands inconsistently;Follows one step commands with increased time Safety/Judgement: Decreased awareness of safety Awareness: Intellectual Problem Solving: Slow processing;Decreased initiation;Requires verbal cues;Requires tactile cues        Exercises      General Comments        Pertinent Vitals/Pain Pain Assessment: Faces Faces Pain Scale: No hurt Pain Location: .    Home Living                      Prior Function            PT Goals (current goals can now be found in the care plan section) Acute Rehab PT Goals Patient Stated Goal: unable to  state PT Goal Formulation: With patient Time For Goal Achievement: 02/02/20 Additional Goals Additional Goal #1: Pt will tolerate OOB in chair for 2 hours. Progress towards PT goals: Not progressing toward goals - comment (continues to require total assist for mobility.)    Frequency    Min 2X/week      PT Plan Current plan remains appropriate    Co-evaluation              AM-PAC PT "6 Clicks" Mobility   Outcome Measure  Help needed turning from your back to your side while in a flat bed without using bedrails?:  Total Help needed moving from lying on your back to sitting on the side of a flat bed without using bedrails?: Total Help needed moving to and from a bed to a chair (including a wheelchair)?: Total Help needed standing up from a chair using your arms (e.g., wheelchair or bedside chair)?: Total Help needed to walk in hospital room?: Total Help needed climbing 3-5 steps with a railing? : Total 6 Click Score: 6    End of Session   Activity Tolerance: Patient tolerated treatment well Patient left: in bed;with call bell/phone within reach Nurse Communication: Mobility status PT Visit Diagnosis: Other abnormalities of gait and mobility (R26.89);Muscle weakness (generalized) (M62.81)     Time: 1020-1050 PT Time Calculation (min) (ACUTE ONLY): 30 min  Charges:  $Therapeutic Activity: 8-22 mins                     Avin Gibbons, PT, GCS 01/23/20,11:31 AM

## 2020-01-23 NOTE — Progress Notes (Signed)
[facility] PROGRESS NOTE  Ryan Orozco KNL:[id number] DOB: 18-Jun-1965 DOA: 11/01/2019 PCP: Default, Provider, MD    54   Status: Remains inpatient appropriate because:Altered mental status, Unsafe d/c plan and Inpatient level of care appropriate due to severity of illness. Patient newly diagnosed with anoxic brain injury  Dispo: The patient is from: Home              Anticipated d/c is to: SNF              Anticipated d/c date is: > 3 days              Patient currently is medically stable to d/c.  3 years to discharge: No SNF bed offers.  Needs trach capable facility. TOC communicating with financial counseling team who have yet to hear back from DSS regarding patient's assigned caseworker.   Code Status: Full Family Communication: 12/15 dtr [name] by telephone DVT prophylaxis: Subcutaneous heparin Vaccination status: Covid vaccination status unknown  Foley catheter: No  HPI: 54 year old male with HIV, chronic alcohol abuse who was presented to the emergency department after being found unresponsive outside a convinient store.  EMS found him pulseless in PEA, unknown downtime, successfully resuscitated and brought to the emergency department.  UDS positive for THC/benzodiazepines.  CT head unremarkable.  Failed extubation trials due to severe anoxic brain injury and had tracheostomy placed.  PEG placed on 10/15.  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  This has led to significant pain as well.  [name] was consulted and recommendations/input/orders highly appreciated.  She may also benefit from Botox injections to both hips to aid in treatment of spasticity.   Significant Hospital Events   9/29 Admit post PEA arrest. UDS positive for THC, benzo's. ETOH 177 10/05 EEG ongoing. Versed restarted overnight due to agitation. Nicardipine increased.  10/06 Developed tachypnea with WUA, no  follow commands off sedation  10/07 Vomiting, TF held / restarted with recurrent vomiting 10/16 tracheostomy collar for 9 hours 10/18->10/19 off vent all night  10/20> TC 11/15 > downsized to #4 Shiley flex CFS  Subjective: Awake but somewhat sleepier than usual.  Objective: Vitals:   01/23/20 0514 01/23/20 0814  BP: 120/68   Pulse: 90 81  Resp: 18 16  Temp: 98 F (36.7 C)   SpO2: 98% 95%    Intake/Output Summary (Last 24 hours) at 01/23/2020 1012 Last data filed at 01/23/2020 0515 Gross per 24 hour  Intake -  Output 1500 ml  Net -1500 ml   Filed Weights   01/21/20 0500 01/22/20 0412 01/23/20 0700  Weight: 74 kg 72 kg 74.1 kg    Exam: General: Awake weepy, calm, no acute distress Pulmonary: Exam is clear anteriorly, no increased work of breathing, trach with minimal secretions.  FiO2 21% Cardiac: S1-S2 without rubs murmurs thrills or gallops, no peripheral edema, no JVD, no tachycardia Abdomen: Soft, nontender nondistended.  PEG tube insertion site unremarkable.  Tube feeding infusing. Neurological: Awake but sleepier than baseline.  Continues with hypertonicity of both upper and lower extremities worse on the right side.   Assessment/Plan: Acute problems: Anoxic brain injury 2/2 PEA arrest/pain syndrome secondary to hypertonicity/spastic tetraplegia:  -Presumed 2/2 combination alcohol and BZD overdose with UDS positive for THC and BZDs -Do not expect he will recover fully - long-term SNF placement recommended -Continue PT/OT/SLP -Continue baclofen, gabapentin, scheduled oxycodone and Zanaflex 4 mg QID **12/21 patient sleepier than usual and this was  also documented during SLP evaluation on 12/20.  Will decrease oxycodone frequency from every 6 hours to every 8 hours -Appreciate assistance of [name] given to Botox next week to right hamstrings, rectus femoris -Continue bilateral upper extremity WHO resting splints -Continue PROM per nursing staff  every 4 hours -Continue low-dose Seroquel for brain injury sequela -Continue scheduled Tylenol for pain -Continue CREA muscle rub -Continue KREG rotational bed   Acute hypoxemic respiratory failure/new tracheostomy tube requirement/Corynebacterium tracheobronchitis -Hypoxemia now resolved-trach tube placed 10/3 -Continue PMV training per SLP -#4 cuffless trach-capping trial at discretion of trach team -[place] team following.  Not a candidate for decannulation until patient can reliably follow commands, cough and clear airway. -Sputum culture consistent with abundant Corynebacterium-continue linezolid for a total of 7 days-continue Mucomyst nebs with albuterol nebs -continue physiotherapy with Vibra vest along with supervised PMV trials  Dysphagia 2/2 anoxic brain injury -Continue tube feeding per PEG -Repeat SLP evaluation on 11/23 with recommendation for n.p.o. status  Goals of care:  -Palliative care was involved during this hospitalization.   -Goals of care were discussed. remains full code.  - Palliative care recommended outpatient follow-up with palliative providers. -May need to revisit end-of-life goals of care since patient making poor progress and currently having significant pain related to hypertonicity from brain injury  Protein calorie malnutrition nutrition Status: Nutrition Problem: Increased nutrient needs Etiology: acute illness Signs/Symptoms: estimated needs Interventions: Tube feeding bolus doses of Osmolite, Juven twice daily, Prosource TF 45 mL 3 times daily Estimated body mass index is 26.37 kg/m as calculated from the following:   Height as of this encounter: 5' 6"  (1.676 m).   Weight as of this encounter: 74.1 kg.   Multiple decubiti not POA Wound / Incision (Open or Dehisced) 11/17/19 Puncture Abdomen Left;Anterior;Upper G-tube insertion site  (Active)  Date First Assessed/Time First Assessed: 11/17/19 1646   Wound Type: Puncture  Location: Abdomen   Location Orientation: Left;Anterior;Upper  Wound Description (Comments): G-tube insertion site   Present on Admission: No    Assessments 11/17/2019  4:47 PM 01/23/2020  8:19 AM  Dressing Type Gauze (Comment);Tape dressing Gauze (Comment)  Dressing Changed New Changed  Dressing Status Clean;Dry;Intact Dry;Clean;Intact  Dressing Change Frequency PRN -  Site / Wound Assessment Clean;Dry;Pink -  Peri-wound Assessment Intact -  Closure None -  Drainage Amount None -  Treatment Cleansed -     No Linked orders to display      Other problems: Hypertension/grade 1 diastolic dysfunction:  -Continue amlodipine  -Echocardiogram this admission with preserved LVEF with evidence of grade   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   -Continue Keppra; levetiracetam level 35.9 on 11/12 -no further seizure activity since transitioned out of ICU setting therefore will discontinue seizure precaution  HIV:  -CD4 count of 124 October 2021 (had been as high as 250 Dec 2021) -Dc'd Tivicay and Descovy infavor of Biktarvy to for eventual dc to SNF -Continue prophylactic Bactrim  Inguinal hernias -Evaluated by general surgery this admission. Documented as chronically incarcerated. Patient has been clinically stable and tolerating tube feedings and having bowel movements so unless patient obstructed would not be considered a candidate for repair. Even if obstructed consulting surgeon was not sure he would be a candidate for hernia repair regardless .   Data Reviewed: Basic Metabolic Panel: No results for input(s): NA, K, CL, CO2, GLUCOSE, BUN, CREATININE, CALCIUM, MG, PHOS in the last 168 hours. Liver Function Tests: No results for input(s): AST, ALT, ALKPHOS,  BILITOT, PROT, ALBUMIN in the last 168 hours. No results for input(s): LIPASE, AMYLASE in the last 168 hours. No results for input(s): AMMONIA in the last 168 hours. CBC: No results for input(s): WBC, NEUTROABS, HGB, HCT,  MCV, PLT in the last 168 hours. Cardiac Enzymes: No results for input(s): CKTOTAL, CKMB, CKMBINDEX, TROPONINI in the last 168 hours. BNP (last 3 results) No results for input(s): BNP in the last 8760 hours.  ProBNP (last 3 results) No results for input(s): PROBNP in the last 8760 hours.  CBG: Recent Labs  Lab 01/22/20 1635 01/22/20 2109 01/22/20 2240 01/23/20 0320 01/23/20 0811  GLUCAP 95 155* 133* 101* 126*    Recent Results (from the past 240 hour(s))  Culture, respiratory (non-expectorated)     Status: None   Collection Time: 01/16/20  9:30 AM   Specimen: Tracheal Aspirate; Respiratory  Result Value Ref Range Status   Specimen Description TRACHEAL ASPIRATE  Final   Special Requests NONE  Final   Gram Stain   Final    MODERATE WBC PRESENT,BOTH PMN AND MONONUCLEAR MODERATE GRAM POSITIVE RODS FEW GRAM NEGATIVE RODS    Culture   Final    ABUNDANT CORYNEBACTERIUM STRIATUM Standardized susceptibility testing for this organism is not available. Performed at [facility], [place] [address], [place], [place] [address]    Report Status 01/18/2020 FINAL  Final     Studies: No results found.  Scheduled Meds: . acetaminophen (TYLENOL) oral liquid 160 mg/5 mL  650 mg Per Tube Q6H  . acetylcysteine  4 mL Nebulization TID  . albuterol  2.5 mg Nebulization TID  . amLODipine  10 mg Per Tube Daily  . baclofen  30 mg Per Tube TID  . chlorhexidine gluconate (MEDLINE KIT)  15 mL Mouth Rinse BID  . Darunavir-Cobicisctat-Emtricitabine-Tenofovir Alafenamide  1 tablet Oral Q breakfast  . famotidine  10.4 mg Per Tube BID  . feeding supplement (OSMOLITE 1.5 CAL)  237 mL Per Tube Q24H  . feeding supplement (OSMOLITE 1.5 CAL)  474 mL Per Tube TID  . feeding supplement (PROSource TF)  45 mL Per Tube TID  . fiber  1 packet Per Tube BID  . folic acid  1 mg Per Tube Daily  . free water  200 mL Per Tube Q4H  . gabapentin  200 mg Per Tube Q8H  . heparin injection (subcutaneous)  5,000  Units Subcutaneous Q8H  . levETIRAcetam  1,500 mg Per Tube BID  . linezolid  600 mg Per Tube Q12H  . Muscle Rub   Topical QID  . oxyCODONE  5 mg Per Tube Q8H  . QUEtiapine  12.5 mg Per Tube QHS  . scopolamine  1 patch Transdermal Q72H  . sulfamethoxazole-trimethoprim  1 tablet Per Tube Once per day on Mon Wed Fri  . thiamine  100 mg Per Tube Daily  . tiZANidine  4 mg Per Tube QID   Continuous Infusions:   Active Problems:   Cardiac arrest [facility])   Community acquired pneumonia of left upper lobe of lung   Anoxic brain injury ([place])   Alcohol abuse   Acute respiratory failure with hypoxia (HCC)   Vomiting   Pressure injury of skin   Small bowel obstruction (HCC)   Palliative care encounter   Bowel obstruction (HCC)   Chronic respiratory failure (HCC)   Diastolic dysfunction   Tracheostomy dependent (HCC)   Dysphagia   Protein-calorie malnutrition (HCC)   Seizures ([place])   Bilateral recurrent inguinal hernia  Muscle spasticity   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[place])   Tracheobronchitis   Sequela of Corynebacterium infection   Consultants:  PCCM  Palliative medicine  General surgery  Neurology  Interventional radiology  Procedures:  EEG  Echocardiogram  Tracheostomy  Antibiotics: Anti-infectives (From admission, onward)   Start     Dose/Rate Route Frequency Ordered Stop   01/19/20 2200  linezolid (ZYVOX) tablet 600 mg        600 mg Per Tube Every 12 hours 01/19/20 1534 01/26/20 2159   01/19/20 1100  levofloxacin (LEVAQUIN) tablet 500 mg  Status:  Discontinued        500 mg Per Tube Daily 01/19/20 1004 01/19/20 1534   12/19/19 0930  Darunavir-Cobicisctat-Emtricitabine-Tenofovir Alafenamide (SYMTUZA) 800-150-200-10 MG TABS 1 tablet        1 tablet Oral Daily with breakfast 12/19/19 0840     12/13/19 1000  bictegravir-emtricitabine-tenofovir AF (BIKTARVY) 50-200-25 MG per tablet 1 tablet  Status:  Discontinued        1 tablet Oral Daily 12/12/19  1413 12/19/19 0840   11/17/19 1548  ceFAZolin (ANCEF) 2-4 GM/100ML-% IVPB       Note to Pharmacy: Domenick Bookbinder   : cabinet override      11/17/19 1548 11/18/19 0359   11/16/19 1515  ceFAZolin (ANCEF) IVPB 2g/100 mL premix        2 g 200 mL/hr over 30 Minutes Intravenous To Radiology 11/16/19 1511 11/17/19 1625   11/15/19 0900  sulfamethoxazole-trimethoprim (BACTRIM DS) 800-160 MG per tablet 1 tablet        1 tablet Per Tube Once per day on Mon Wed Fri 11/13/19 1906     11/13/19 1615  dolutegravir (TIVICAY) tablet 50 mg  Status:  Discontinued        50 mg Oral Daily 11/13/19 1521 12/12/19 1413   11/13/19 1615  emtricitabine-tenofovir AF (DESCOVY) 200-25 MG per tablet 1 tablet  Status:  Discontinued        1 tablet Per Tube Daily 11/13/19 1521 12/12/19 1413   11/13/19 1530  sulfamethoxazole-trimethoprim (BACTRIM DS) 800-160 MG per tablet 1 tablet  Status:  Discontinued        1 tablet Oral Once per day on Mon Wed Fri 11/13/19 1441 11/13/19 1906   11/13/19 1500  bictegravir-emtricitabine-tenofovir AF (BIKTARVY) 50-200-25 MG per tablet 1 tablet  Status:  Discontinued        1 tablet Oral Daily 11/13/19 1403 11/13/19 1521   11/10/19 1000  erythromycin 250 mg in sodium chloride 0.9 % 100 mL IVPB        250 mg 100 mL/hr over 60 Minutes Intravenous Every 8 hours 11/10/19 0907 11/11/19 2000   11/07/19 1200  ceFEPIme (MAXIPIME) 2 g in sodium chloride 0.9 % 100 mL IVPB        2 g 200 mL/hr over 30 Minutes Intravenous Every 8 hours 11/07/19 1013 11/13/19 2127   11/03/19 1200  cefTRIAXone (ROCEPHIN) 2 g in sodium chloride 0.9 % 100 mL IVPB        2 g 200 mL/hr over 30 Minutes Intravenous Every 24 hours 11/03/19 0922 11/05/19 1427   11/02/19 0800  vancomycin (VANCOREADY) IVPB 750 mg/150 mL  Status:  Discontinued        750 mg 150 mL/hr over 60 Minutes Intravenous Every 12 hours 11/01/19 1935 11/02/19 1105   11/02/19 0600  piperacillin-tazobactam (ZOSYN) IVPB 3.375 g  Status:  Discontinued         3.375 g 12.5 mL/hr  over 240 Minutes Intravenous Every 8 hours 11/02/19 0311 11/03/19 0920   11/01/19 2200  ceFEPIme (MAXIPIME) 2 g in sodium chloride 0.9 % 100 mL IVPB  Status:  Discontinued        2 g 200 mL/hr over 30 Minutes Intravenous Every 12 hours 11/01/19 2143 11/02/19 0311   11/01/19 1915  vancomycin (VANCOREADY) IVPB 1500 mg/300 mL        1,500 mg 150 mL/hr over 120 Minutes Intravenous  Once 11/01/19 1909 11/01/19 2147   11/01/19 1845  cefTRIAXone (ROCEPHIN) 1 g in sodium chloride 0.9 % 100 mL IVPB        1 g 200 mL/hr over 30 Minutes Intravenous  Once 11/01/19 1842 11/01/19 2051   11/01/19 1845  azithromycin (ZITHROMAX) 500 mg in sodium chloride 0.9 % 250 mL IVPB        500 mg 250 mL/hr over 60 Minutes Intravenous  Once 11/01/19 1842 11/01/19 2051       Time spent: 20 minutes    Erin Hearing ANP  [facility]  Pager [phone].  01/23/2020, 10:12 AM  LOS: 83 days

## 2020-01-24 LAB — BASIC METABOLIC PANEL
Anion gap: 11 (ref 5–15)
BUN: 21 mg/dL — ABNORMAL HIGH (ref 6–20)
CO2: 27 mmol/L (ref 22–32)
Calcium: 10 mg/dL (ref 8.9–10.3)
Chloride: 107 mmol/L (ref 98–111)
Creatinine, Ser: 0.66 mg/dL (ref 0.61–1.24)
GFR, Estimated: >60 mL/min (ref >60)
Glucose, Bld: 101 mg/dL — ABNORMAL HIGH (ref 70–99)
Potassium: 4 mmol/L (ref 3.5–5.1)
Sodium: 145 mmol/L (ref 135–145)

## 2020-01-24 LAB — GLUCOSE, CAPILLARY
Glucose-Capillary: 95 mg/dL (ref 70–99)
Glucose-Capillary: 86 mg/dL (ref 70–99)
Glucose-Capillary: 143 mg/dL — ABNORMAL HIGH (ref 70–99)
Glucose-Capillary: 88 mg/dL (ref 70–99)
Glucose-Capillary: 182 mg/dL — ABNORMAL HIGH (ref 70–99)
Glucose-Capillary: 122 mg/dL — ABNORMAL HIGH (ref 70–99)

## 2020-01-24 NOTE — Progress Notes (Signed)
[facility] PROGRESS NOTE  Ryan Orozco MRN:[id number] DOB: 05/04/1965 DOA: 11/01/2019 PCP: Default, Provider, MD    11/16   Status: Remains inpatient appropriate because:Altered mental status, Unsafe d/c plan and Inpatient level of care appropriate due to severity of illness. Patient newly diagnosed with anoxic brain injury  Dispo: The patient is from: Home              Anticipated d/c is to: SNF              Anticipated d/c date is: > 3 days              Patient currently is medically stable to d/c.  3 years to discharge: No SNF bed offers.  Needs trach capable facility. TOC communicating with financial counseling team who have yet to hear back from DSS regarding patient's assigned caseworker.   Code Status: Full Family Communication: 12/15 dtr [name] by telephone DVT prophylaxis: Subcutaneous heparin Vaccination status: Covid vaccination status unknown  Foley catheter: No  HPI: 54-year-old male with HIV, chronic alcohol abuse who was presented to the emergency department after being found unresponsive outside a convinient store.  EMS found him pulseless in PEA, unknown downtime, successfully resuscitated and brought to the emergency department.  UDS positive for THC/benzodiazepines.  CT head unremarkable.  Failed extubation trials due to severe anoxic brain injury and had tracheostomy placed.  PEG placed on 10/15.  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  This has led to significant pain as well.  Dr. Swartz/CIR was consulted and recommendations/input/orders highly appreciated.  She may also benefit from Botox injections to both hips to aid in treatment of spasticity.   Significant Hospital Events   9/29 Admit post PEA arrest. UDS positive for THC, benzo's. ETOH 177 10/05 EEG ongoing. Versed restarted overnight due to agitation. Nicardipine increased.  10/06 Developed tachypnea with WUA, no  follow commands off sedation  10/07 Vomiting, TF held / restarted with recurrent vomiting 10/16 tracheostomy collar for 9 hours 10/18->10/19 off vent all night  10/20> TC 11/15 > downsized to #4 Shiley flex CFS  Subjective: Awake -trying to sit up in bed and appears to be trying to get OOB but unable to due to physical limitations  Objective: Vitals:   01/24/20 0358 01/24/20 0515  BP:  (!) 141/84  Pulse: 70 82  Resp: 16 20  Temp:  98.7 F (37.1 C)  SpO2: 96% 96%    Intake/Output Summary (Last 24 hours) at 01/24/2020 1058 Last data filed at 01/24/2020 0346 Gross per 24 hour  Intake --  Output 650 ml  Net -650 ml   Filed Weights   01/21/20 0500 01/15/2020 0412 01/23/20 0700  Weight: 74 kg 72 kg 74.1 kg    Exam: General: Awake, restless trying to reposition self in bed Pulmonary:  Lungs CTA anteriorly, no increased work of breathing, trach with minimal secretions.  FiO2 21% Cardiac: S1-S2 without rubs murmurs thrills or gallops, no peripheral edema, no tachycardia Abdomen: Soft, nontender nondistended.  PEG tube insertion site unremarkable.  Tube feeding infusing. Neurological: Awake but sleepier than baseline.  Continues with hypertonicity of both upper and lower extremities worse on the right side.   Assessment/Plan: Acute problems: Anoxic brain injury 2/2 PEA arrest/pain syndrome secondary to hypertonicity/spastic tetraplegia:  -Presumed 2/2 combination alcohol and BZD overdose with UDS positive for THC and BZDs -Do not expect he will recover fully - long-term SNF placement recommended -Continue PT/OT/SLP -Continue   baclofen, gabapentin, scheduled oxycodone and Zanaflex 4 mg QID **12/21 oxycodone frequency decreased from every 6 hours to every 8 hours due to sleepiness -Appreciate assistance of Dr. Swartz-consideration given to Botox next week to right hamstrings, rectus femoris -Continue bilateral upper extremity WHO resting splints -Continue PROM per nursing staff  every 4 hours -Continue low-dose Seroquel for brain injury sequela -Continue scheduled Tylenol for pain -Continue CREA muscle rub -Continue KREG rotational bed   Acute hypoxemic respiratory failure/new tracheostomy tube requirement/Corynebacterium tracheobronchitis -Hypoxemia now resolved-trach tube placed 10/3 -Continue PMV training per SLP -#4 cuffless trach-capping trial at discretion of trach team -Trach team following.  Not a candidate for decannulation until patient can reliably follow commands, cough and clear airway. -Sputum culture consistent with abundant Corynebacterium-continue linezolid for a total of 7 days (LD due 12/24)-continue Mucomyst nebs with albuterol nebs -continue physiotherapy with Vibra vest along with supervised PMV trials  Dysphagia 2/2 anoxic brain injury -Continue tube feeding per PEG -Repeat SLP evaluation on 11/23 with recommendation for n.p.o. status  Goals of care:  -Palliative care was involved during this hospitalization.   -Goals of care were discussed. remains full code.  - Palliative care recommended outpatient follow-up with palliative providers. -May need to revisit end-of-life goals of care since patient making poor progress and currently having significant pain related to hypertonicity from brain injury  Protein calorie malnutrition nutrition Status: Nutrition Problem: Increased nutrient needs Etiology: acute illness Signs/Symptoms: estimated needs Interventions: Tube feeding bolus doses of Osmolite, Juven twice daily, Prosource TF 45 mL 3 times daily Estimated body mass index is 26.37 kg/m as calculated from the following:   Height as of this encounter: 5' 6" (1.676 m).   Weight as of this encounter: 74.1 kg.   Multiple decubiti not POA Wound / Incision (Open or Dehisced) 11/17/19 Puncture Abdomen Left;Anterior;Upper G-tube insertion site  (Active)  Date First Assessed/Time First Assessed: 11/17/19 1646   Wound Type: Puncture  Location:  Abdomen  Location Orientation: Left;Anterior;Upper  Wound Description (Comments): G-tube insertion site   Present on Admission: No    Assessments 11/17/2019  4:47 PM 01/23/2020  8:19 AM  Dressing Type Gauze (Comment);Tape dressing Gauze (Comment)  Dressing Changed New Changed  Dressing Status Clean;Dry;Intact Dry;Clean;Intact  Dressing Change Frequency PRN --  Site / Wound Assessment Clean;Dry;Pink --  Peri-wound Assessment Intact --  Closure None --  Drainage Amount None --  Treatment Cleansed --     No Linked orders to display      Other problems: Hypertension/grade 1 diastolic dysfunction:  -Continue amlodipine  -Echocardiogram this admission with preserved LVEF with evidence of grade   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   -Continue Keppra; levetiracetam level 35.9 on 11/12 -no further seizure activity since transitioned out of ICU setting therefore will discontinue seizure precaution  HIV:  -CD4 count of 124 October 2021 (had been as high as 250 Dec 2021) -Dc'd Tivicay and Descovy infavor of Biktarvy to for eventual dc to SNF -Continue prophylactic Bactrim  Inguinal hernias -Evaluated by general surgery this admission. Documented as chronically incarcerated. Patient has been clinically stable and tolerating tube feedings and having bowel movements so unless patient obstructed would not be considered a candidate for repair. Even if obstructed consulting surgeon was not sure he would be a candidate for hernia repair regardless .   Data Reviewed: Basic Metabolic Panel: Recent Labs  Lab 01/24/20 0120  NA 145  K 4.0  CL 107  CO2 27  GLUCOSE 101*    BUN 21*  CREATININE 0.66  CALCIUM 10.0   Liver Function Tests: No results for input(s): AST, ALT, ALKPHOS, BILITOT, PROT, ALBUMIN in the last 168 hours. No results for input(s): LIPASE, AMYLASE in the last 168 hours. No results for input(s): AMMONIA in the last 168 hours. CBC: No results for  input(s): WBC, NEUTROABS, HGB, HCT, MCV, PLT in the last 168 hours. Cardiac Enzymes: No results for input(s): CKTOTAL, CKMB, CKMBINDEX, TROPONINI in the last 168 hours. BNP (last 3 results) No results for input(s): BNP in the last 8760 hours.  ProBNP (last 3 results) No results for input(s): PROBNP in the last 8760 hours.  CBG: Recent Labs  Lab 01/23/20 1808 01/23/20 1926 01/23/20 2314 01/24/20 0321 01/24/20 0732  GLUCAP 191* 182* 107* 95 86    Recent Results (from the past 240 hour(s))  Culture, respiratory (non-expectorated)     Status: None   Collection Time: 01/16/20  9:30 AM   Specimen: Tracheal Aspirate; Respiratory  Result Value Ref Range Status   Specimen Description TRACHEAL ASPIRATE  Final   Special Requests NONE  Final   Gram Stain   Final    MODERATE WBC PRESENT,BOTH PMN AND MONONUCLEAR MODERATE GRAM POSITIVE RODS FEW GRAM NEGATIVE RODS    Culture   Final    ABUNDANT CORYNEBACTERIUM STRIATUM Standardized susceptibility testing for this organism is not available. Performed at [facility], [place] [address] [place], [place] [address]    Report Status 01/18/2020 FINAL  Final     Studies: No results found.  Scheduled Meds: . acetaminophen (TYLENOL) oral liquid 160 mg/5 mL  650 mg Per Tube Q6H  . amLODipine  10 mg Per Tube Daily  . baclofen  30 mg Per Tube TID  . chlorhexidine gluconate (MEDLINE KIT)  15 mL Mouth Rinse BID  . Darunavir-Cobicisctat-Emtricitabine-Tenofovir Alafenamide  1 tablet Oral Q breakfast  . famotidine  10.4 mg Per Tube BID  . feeding supplement (OSMOLITE 1.5 CAL)  237 mL Per Tube Q24H  . feeding supplement (OSMOLITE 1.5 CAL)  474 mL Per Tube TID  . feeding supplement (PROSource TF)  45 mL Per Tube TID  . fiber  1 packet Per Tube BID  . folic acid  1 mg Per Tube Daily  . free water  200 mL Per Tube Q4H  . gabapentin  200 mg Per Tube Q8H  . heparin injection (subcutaneous)  5,000 Units Subcutaneous Q8H  . levETIRAcetam  1,500  mg Per Tube BID  . linezolid  600 mg Per Tube Q12H  . Muscle Rub   Topical QID  . oxyCODONE  5 mg Per Tube Q8H  . QUEtiapine  12.5 mg Per Tube QHS  . scopolamine  1 patch Transdermal Q72H  . sulfamethoxazole-trimethoprim  1 tablet Per Tube Once per day on Mon Wed Fri  . thiamine  100 mg Per Tube Daily  . 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[facility])   Dysphagia   Protein-calorie malnutrition ([facility])   Seizures ([facility])   Bilateral recurrent  inguinal hernia   Muscle spasticity   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[place])   Tracheobronchitis   Sequela of Corynebacterium infection   Consultants:  PCCM  Palliative medicine  General surgery  Neurology  Interventional radiology  Procedures:  EEG  Echocardiogram  Tracheostomy  Antibiotics: Anti-infectives (From admission, onward)   Start     Dose/Rate Route Frequency Ordered Stop   01/19/20 2200  linezolid (ZYVOX) tablet 600 mg        600 mg Per Tube Every 12 hours 01/19/20 1534 01/26/20 2159   01/19/20 1100  levofloxacin (LEVAQUIN) tablet 500 mg  Status:  Discontinued        500 mg Per Tube Daily 01/19/20 1004 01/19/20 1534   12/19/19 0930  Darunavir-Cobicisctat-Emtricitabine-Tenofovir Alafenamide (SYMTUZA) 800-150-200-10 MG TABS 1 tablet        1 tablet Oral Daily with breakfast 12/19/19 0840     12/13/19 1000  bictegravir-emtricitabine-tenofovir AF (BIKTARVY) 50-200-25 MG per tablet 1 tablet  Status:  Discontinued        1 tablet Oral Daily 12/12/19 1413 12/19/19 0840   11/17/19 1548  ceFAZolin  (ANCEF) 2-4 GM/100ML-% IVPB       Note to Pharmacy: Domenick Bookbinder   : cabinet override      11/17/19 1548 11/18/19 0359   11/16/19 1515  ceFAZolin (ANCEF) IVPB 2g/100 mL premix        2 g 200 mL/hr over 30 Minutes Intravenous To Radiology 11/16/19 1511 11/17/19 1625   11/15/19 0900  sulfamethoxazole-trimethoprim (BACTRIM DS) 800-160 MG per tablet 1 tablet        1 tablet Per Tube Once per day on Mon Wed Fri 11/13/19 1906     11/13/19 1615  dolutegravir (TIVICAY) tablet 50 mg  Status:  Discontinued        50 mg Oral Daily 11/13/19 1521 12/12/19 1413   11/13/19 1615  emtricitabine-tenofovir AF (DESCOVY) 200-25 MG per tablet 1 tablet  Status:  Discontinued        1 tablet Per Tube Daily 11/13/19 1521 12/12/19 1413   11/13/19 1530  sulfamethoxazole-trimethoprim (BACTRIM DS) 800-160 MG per tablet 1 tablet  Status:  Discontinued        1 tablet Oral Once per day on Mon Wed Fri 11/13/19 1441 11/13/19 1906   11/13/19 1500  bictegravir-emtricitabine-tenofovir AF (BIKTARVY) 50-200-25 MG per tablet 1 tablet  Status:  Discontinued        1 tablet Oral Daily 11/13/19 1403 11/13/19 1521   11/10/19 1000  erythromycin 250 mg in sodium chloride 0.9 % 100 mL IVPB        250 mg 100 mL/hr over 60 Minutes Intravenous Every 8 hours 11/10/19 0907 11/11/19 2000   11/07/19 1200  ceFEPIme (MAXIPIME) 2 g in sodium chloride 0.9 % 100 mL IVPB        2 g 200 mL/hr over 30 Minutes Intravenous Every 8 hours 11/07/19 1013 11/13/19 2127   11/03/19 1200  cefTRIAXone (ROCEPHIN) 2 g in sodium chloride 0.9 % 100 mL IVPB        2 g 200 mL/hr over 30 Minutes Intravenous Every 24 hours 11/03/19 0922 11/05/19 1427   11/02/19 0800  vancomycin (VANCOREADY) IVPB 750 mg/150 mL  Status:  Discontinued        750 mg 150 mL/hr over 60 Minutes Intravenous Every 12 hours 11/01/19 1935 11/02/19 1105   11/02/19 0600  piperacillin-tazobactam (ZOSYN) IVPB 3.375 g  Status:  Discontinued        3.375  g 12.5 mL/hr over 240 Minutes Intravenous  Every 8 hours 11/02/19 0311 11/03/19 0920   11/01/19 2200  ceFEPIme (MAXIPIME) 2 g in sodium chloride 0.9 % 100 mL IVPB  Status:  Discontinued        2 g 200 mL/hr over 30 Minutes Intravenous Every 12 hours 11/01/19 2143 11/02/19 0311   11/01/19 1915  vancomycin (VANCOREADY) IVPB 1500 mg/300 mL        1,500 mg 150 mL/hr over 120 Minutes Intravenous  Once 11/01/19 1909 11/01/19 2147   11/01/19 1845  cefTRIAXone (ROCEPHIN) 1 g in sodium chloride 0.9 % 100 mL IVPB        1 g 200 mL/hr over 30 Minutes Intravenous  Once 11/01/19 1842 11/01/19 2051   11/01/19 1845  azithromycin (ZITHROMAX) 500 mg in sodium chloride 0.9 % 250 mL IVPB        500 mg 250 mL/hr over 60 Minutes Intravenous  Once 11/01/19 1842 11/01/19 2051       Time spent: 20 minutes      ANP  [facility]  Pager [phone].  01/24/2020, 10:58 AM  LOS: 84 days  

## 2020-01-24 NOTE — Plan of Care (Signed)
  Problem: Education: Goal: Knowledge of General Education information will improve Description: Including pain rating scale, medication(s)/side effects and non-pharmacologic comfort measures 01/24/2020 0513 by Debbrah Alar, RN Outcome: Not Progressing 01/24/2020 0507 by Debbrah Alar, RN Outcome: Not Progressing   Problem: Health Behavior/Discharge Planning: Goal: Ability to manage health-related needs will improve 01/24/2020 0513 by Debbrah Alar, RN Outcome: Not Progressing 01/24/2020 0507 by Debbrah Alar, RN Outcome: Not Progressing   Problem: Clinical Measurements: Goal: Ability to maintain clinical measurements within normal limits will improve 01/24/2020 0513 by Debbrah Alar, RN Outcome: Not Progressing 01/24/2020 0507 by Debbrah Alar, RN Outcome: Not Progressing Goal: Will remain free from infection 01/24/2020 0513 by Debbrah Alar, RN Outcome: Not Progressing 01/24/2020 0507 by Debbrah Alar, RN Outcome: Not Progressing Goal: Diagnostic test results will improve 01/24/2020 0513 by Debbrah Alar, RN Outcome: Not Progressing 01/24/2020 0507 by Debbrah Alar, RN Outcome: Not Progressing Goal: Respiratory complications will improve 01/24/2020 0513 by Debbrah Alar, RN Outcome: Not Progressing 01/24/2020 0507 by Velia Meyer D, RN Outcome: Not Progressing Goal: Cardiovascular complication will be avoided 01/24/2020 0513 by Debbrah Alar, RN Outcome: Not Progressing 01/24/2020 0507 by Debbrah Alar, RN Outcome: Not Progressing   Problem: Activity: Goal: Risk for activity intolerance will decrease 01/24/2020 0513 by Debbrah Alar, RN Outcome: Not Progressing 01/24/2020 0507 by Debbrah Alar, RN Outcome: Not Progressing   Problem: Nutrition: Goal: Adequate nutrition will be maintained 01/24/2020 0513 by Debbrah Alar, RN Outcome: Not Progressing 01/24/2020 0507 by Velia Meyer D, RN Outcome: Not Progressing   Problem: Coping: Goal: Level of anxiety will decrease 01/24/2020 0513 by Debbrah Alar, RN Outcome: Not Progressing 01/24/2020 0507 by Velia Meyer D, RN Outcome: Not Progressing   Problem: Elimination: Goal: Will not experience complications related to bowel motility 01/24/2020 0513 by Debbrah Alar, RN Outcome: Not Progressing 01/24/2020 0507 by Debbrah Alar, RN Outcome: Not Progressing Goal: Will not experience complications related to urinary retention 01/24/2020 0513 by Debbrah Alar, RN Outcome: Not Progressing 01/24/2020 0507 by Debbrah Alar, RN Outcome: Not Progressing

## 2020-01-24 NOTE — Progress Notes (Signed)
Occupational Therapy Treatment Patient Details Name: Ryan Orozco MRN: [id number] DOB: 18-Mar-1965 Today's Date: 01/24/2020    History of present illness 54 year old male found down outside convenience store 9/27. Pt with PEA and hypoxic brain injury. Trach 10/13, PEG 10/15. PMHx: HIV AIDS and alcohol abuse   OT comments  Pt received in bed, slightly slid down. NT arrived to assist pt with repositioning in bed. Pt then positioned in chair position. When presented with deodorant, pt began smiling and appeared to initiate lifting up LUE. Pt required maxA with hand over hand assistance to apply deodorant. Pt initiated horizontal adduction of LUE and attempting to initiate RUE shoulder flexion/abduction. Pt continues to present with increased flexion in RUE and RLE this session. Pt requiring 5-30 seconds of processing time for following commands this session and appears to follow commands ~25% of the time. Pt slowly progressing toward established OT goals. Pt will continue to benefit from skilled OT services to maximize safety and independence with ADL/IADL and functional mobility. Will continue to follow acutely and progress as tolerated.    Follow Up Recommendations  SNF    Equipment Recommendations  None recommended by OT    Recommendations for Other Services      Precautions / Restrictions Precautions Precautions: Fall Precaution Comments: trach, PEG, anoxic, flexor tone R>L LE Restrictions Weight Bearing Restrictions: No Other Position/Activity Restrictions: has soft heels       Mobility Bed Mobility Overal bed mobility: Needs Assistance Bed Mobility: Rolling Rolling: Total assist;+2 for physical assistance         General bed mobility comments: resisting rolling in bed at times, did not progress to EOB this date  Transfers Overall transfer level: Needs assistance   Transfers: Sit to/from Stand Sit to Stand: Total assist;+2 physical assistance;From elevated  surface         General transfer comment: not attempted    Balance       Sitting balance - Comments: deferred this date       Standing balance comment: unable to assess                           ADL either performed or assessed with clinical judgement   ADL Overall ADL's : Needs assistance/impaired     Grooming: Maximal assistance;Sitting;Bed level Grooming Details (indicate cue type and reason): hand over hand assist provided;pt initiating shoulder ROM with presentation of deodorant, initiated horizontal adduction of LUE with therapist hand over hand assistance Upper Body Bathing: Total assistance Upper Body Bathing Details (indicate cue type and reason): hand over hand for pt to rub lotion on BUE Lower Body Bathing: Total assistance Lower Body Bathing Details (indicate cue type and reason): hand over hand assistance for pt to rub lotion on legs, no initiation of BUE with application of lotion                       General ADL Comments: for pt and therapist safety, pt completed ADL while seated upright in chair position in bed;NT assisted with repositioning pt higher in bed and using wedge to reduce pt sliding in bed     Vision   Additional Comments: pt not visually tracking consistently;appears to mostly fixate on object in intervals while tracking object around foot of the bed. Pt with decreased perception and coordination when attempting to grab flowers with therapist supporting LUE.   Perception     Praxis  Cognition Arousal/Alertness: Awake/alert Behavior During Therapy: WFL for tasks assessed/performed Overall Cognitive Status: Impaired/Different from baseline Area of Impairment: Attention;Safety/judgement;Problem solving;Awareness;Orientation;Memory;Following commands               Rancho Levels of Cognitive Functioning Rancho Los Amigos Scales of Cognitive Functioning: Localized response Orientation Level: Disoriented  to;Place;Time;Situation Current Attention Level: Focused Memory: Decreased short-term memory Following Commands: Follows one step commands inconsistently;Follows one step commands with increased time Safety/Judgement: Decreased awareness of safety Awareness: Intellectual Problem Solving: Slow processing;Decreased initiation;Requires verbal cues;Requires tactile cues General Comments: pt with smiling during session especially when therapist brought out his deodorant, pt laughing at therapist's jokes occassionally after increased processing time;pt required about 30 seconds to process information, blinked (sqeezing eyes) with command, 1 and then 2, pt unable to utilize this skill for answering questions this date. Pt with decreased attention, about50min, following commands about ~25% of time, able to accurately locate body parts with regular time for processing, pointing to body parts with therapist supporting LUE        Exercises Exercises: Other exercises General Exercises - Upper Extremity Shoulder Flexion: PROM;Both;10 reps;Seated (bed level) Shoulder ABduction: PROM;Both;Seated;10 reps (bed level) Shoulder ADduction: PROM;Both;10 reps;Seated (bed level) Shoulder Horizontal ABduction: PROM;Both;10 reps;Seated (bed level) Elbow Flexion: PROM;Both;10 reps;Seated (bed level) Elbow Extension: PROM;Both;5 reps;Seated (bed level) General Exercises - Lower Extremity Quad Sets: PROM;Both;Supine;Other (comment) (sustained stretch 3x2 minutes on the R, 1 time on the L for about 1 minute) Heel Slides: 5 reps;Supine;Left;AAROM   Shoulder Instructions       General Comments      Pertinent Vitals/ Pain       Pain Assessment: Faces Faces Pain Scale: Hurts a little bit Pain Location: generalized Pain Descriptors / Indicators: Grimacing Pain Intervention(s): Monitored during session  Home Living                                          Prior Functioning/Environment               Frequency  Min 2X/week        Progress Toward Goals  OT Goals(current goals can now be found in the care plan section)  Progress towards OT goals: Progressing toward goals  Acute Rehab OT Goals Patient Stated Goal: unable to state OT Goal Formulation: With patient Time For Goal Achievement: 02/07/20 Potential to Achieve Goals: Fair ADL Goals Additional ADL Goal #1: Pt will follow one step appendicular commands 3/5 trials Additional ADL Goal #2: Pt will wash face with moderate assist/hand over hand assist Additional ADL Goal #3: Family will be independent with PROM and positioning bil. UEs Additional ADL Goal #4: Pt will maintain EOB sitting with max A in prep for functional mobility  Plan Discharge plan remains appropriate    Co-evaluation                 AM-PAC OT "6 Clicks" Daily Activity     Outcome Measure   Help from another person eating meals?: Total Help from another person taking care of personal grooming?: Total Help from another person toileting, which includes using toliet, bedpan, or urinal?: Total Help from another person bathing (including washing, rinsing, drying)?: Total Help from another person to put on and taking off regular upper body clothing?: Total Help from another person to put on and taking off regular lower body clothing?: Total 6 Click Score: 6  End of Session Equipment Utilized During Treatment: Oxygen  OT Visit Diagnosis: Cognitive communication deficit (R41.841);Muscle weakness (generalized) (M62.81)   Activity Tolerance Patient limited by fatigue   Patient Left in bed;with call bell/phone within reach;with bed alarm set   Nurse Communication Mobility status        Time: 1324-4010 OT Time Calculation (min): 31 min  Charges: OT General Charges $OT Visit: 1 Visit OT Treatments $Self Care/Home Management : 8-22 mins $Therapeutic Exercise: 8-22 mins  Rosey Bath OTR/L Acute Rehabilitation Services Office:  [phone]    Rebeca Alert 01/24/2020, 1:07 PM

## 2020-01-24 NOTE — Plan of Care (Signed)

## 2020-01-25 LAB — GLUCOSE, CAPILLARY
Glucose-Capillary: 118 mg/dL — ABNORMAL HIGH (ref 70–99)
Glucose-Capillary: 90 mg/dL (ref 70–99)
Glucose-Capillary: 95 mg/dL (ref 70–99)
Glucose-Capillary: 96 mg/dL (ref 70–99)

## 2020-01-25 LAB — CD4/CD8 (T-HELPER/T-SUPPRESSOR CELL)
CD4 absolute: 259 /uL — ABNORMAL LOW (ref 400–1790)
CD4%: 36 % (ref 33–65)
CD8 T Cell Abs: 305 /uL (ref 190–1000)
CD8tox: 42 % — ABNORMAL HIGH (ref 12–40)
Ratio: 0.85 — ABNORMAL LOW (ref 1.0–3.0)
Total lymphocyte count: 726 /uL — ABNORMAL LOW (ref 1000–4000)

## 2020-01-25 NOTE — Progress Notes (Signed)
Nutrition Follow-up  DOCUMENTATION CODES:   Not applicable  INTERVENTION:  Continue bolus TF regimen via PEG: -Provide 2 cartons (448m) Osmolite 1.5 TID @ 0800, 1200, 1700 -Provide 1 carton (2374m Osmolite 1.5 once daily at bedtime @ 2100 -Flush with 6067mree water before and after each tube feeding bolus -Provide 75m20mosource TF TID -Provide Nutrisource fiber BID -Additional free water flushes per MD, currently 200ml26m  Tube feeding regimen provides 2605 kcals, 137 grams protein,1267ml 86m water (2947ml w37mflushes)  NUTRITION DIAGNOSIS:   Increased nutrient needs related to acute illness as evidenced by estimated needs. 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 PMH includes HIV and EtOH use.  09/29 - intubated 10/07- vomiting, TF held, laterrestarted with recurrent vomiting 10/12-R/L inguinal hernia, partial SBO  10/13- trach 10/15-PEG 10/16 - tolerated TC for 9 hours 10/18->10/19 - off vent overnight 10/20- TC 11/15 - trach downsized to #4 Shiley flex CFS 12/06 - trach changed (same #4 Shiley flex CFS)  Placement continues to be difficult for this pt. Pt remains NPO and on ATC. Per CCM, pt not a candidate for decannulation given mental status and difficulty controlling respiratory secretions. Pt continues to receive bolus TF via PEG. Current TF regimen is as follows: Provide 2 cartons (474ml) O4mite 1.5 TID @ 0800, 1200, 1700; Provide 1 carton (237ml) Os28mte 1.5 once daily at bedtime @ 2100; flush with 60ml free3mer before  and after each tube feeding bolus; Provide 75ml Proso65m TF TID; Provide Nutrisource fiber BID. Pt also receiving an additional 200ml free w68m boluses Q4H. Current tube feeding regimen provides 2605 kcals, 137 grams protein,1267ml free wa41m(2947ml with flu82m).  SLP following pt. Pt yet to pass swallow evaluation, but, per SLP, it is likely that pt will soon be ready for instrumental assessment to further assess swallow safety and efficiency.   Admission weight: 78.9 kg Current weight: 74.1 kg  UOP: 2250ml x24 hours79mbs reviewed. Medications: folvite, pepcid, thiamine  Diet Order:   Diet Order            Diet NPO time specified  Diet effective midnight                 EDUCATION NEEDS:   Not appropriate for education at this time  Skin:  Skin Assessment: Skin Integrity Issues: Skin Integrity Issues:: Other (Comment) Stage II: N/A Stage III: N/A Other: puncture abdomen (PEG insertion site)  Last BM:  12/22  Height:   Ht Readings from Last 1 Encounters:  11/01/19 _0  (1.676 m)    Weight:   Wt Readings from Last 1 Encounters:  01/23/20 74.1 kg    BMI:  Body mass index is 26.37 kg/m.  Estimated Nutritional Needs:   Kcal:  2400-2600  Protein:  120-135 grams  Fluid:  >2L/d    Hermila Millis,Larkin InaD pager number and weekend/on-call pager number located in [email or web address]

## 2020-01-25 NOTE — Progress Notes (Addendum)
[facility] PROGRESS NOTE  Gean [place] BTD:[id number] DOB: [birth year] DOA: 11/01/2019 PCP: Default, Provider, MD    11/16   Status: Remains inpatient appropriate because:Altered mental status, Unsafe d/c plan and Inpatient level of care appropriate due to severity of illness. Patient newly diagnosed with anoxic brain injury  Dispo: The patient is from: Home              Anticipated d/c is to: SNF              Anticipated d/c date is: > 3 days              Patient currently is medically stable to d/c.  Barriers to discharge: No SNF bed offers.  Needs trach capable facility. TOC communicating with financial counseling team who have yet to hear back from DSS regarding patient's assigned caseworker.   Code Status: Full Family Communication: 12/15 dtr [name] by telephone DVT prophylaxis: Subcutaneous heparin Vaccination status: Covid vaccination status unknown  Foley catheter: No  HPI: 54 year old male with HIV, chronic alcohol abuse who was presented to the emergency department after being found unresponsive outside a convinient store.  EMS found him pulseless in PEA, unknown downtime, successfully resuscitated and brought to the emergency department.  UDS positive for THC/benzodiazepines.  CT head unremarkable.  Failed extubation trials due to severe anoxic brain injury and had tracheostomy placed.  PEG placed on 10/15.  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  This has led to significant pain as well.  [name] was consulted and recommendations/input/orders highly appreciated.  She may also benefit from Botox injections to both hips to aid in treatment of spasticity.   Significant Hospital Events   9/29 Admit post PEA arrest. UDS positive for THC, benzo's. ETOH 177 10/05 EEG ongoing. Versed restarted overnight due to agitation. Nicardipine increased.  10/06 Developed tachypnea with WUA,  no follow commands off sedation  10/07 Vomiting, TF held / restarted with recurrent vomiting 10/16 tracheostomy collar for 9 hours 10/18->10/19 off vent all night  10/20> TC 11/15 > downsized to #4 Shiley flex CFS  Subjective: Alert and speaking unintelligibly.  Moving arms spontaneously and with apparent purpose.  Objective: Vitals:   01/25/20 0734 01/25/20 0820  BP: 130/76   Pulse: 95   Resp: 19   Temp:    SpO2: 97% 97%    Intake/Output Summary (Last 24 hours) at 01/25/2020 1023 Last data filed at 01/25/2020 3646 Gross per 24 hour  Intake --  Output 2250 ml  Net -2250 ml   Filed Weights   01/21/20 0500 01/22/20 0412 01/23/20 0700  Weight: 74 kg 72 kg 74.1 kg    Exam: General: Alert, calm, attempting to speak but is unintelligible Pulmonary:  Lungs CTA anteriorly, no increased work of breathing, trach with minimal secretions.  FiO2 21% Cardiac: Heart sounds S1-S2, pulses regular nontachycardic, no peripheral edema Abdomen: Soft, nontender nondistended.  Bowel sounds are auscultated.  PEG tube insertion site unremarkable.  Tube feeding infusing. Neurological: Awake and alert.  Attempts to verbally communicate but speech is unintelligible.  Intermittently.  Continues with hypertonicity of both upper and lower extremities worse on the right side.  Spontaneous gross motor activity involving the upper extremities without any fine motor such as fingers or hands.   Assessment/Plan: Acute problems: Anoxic brain injury 2/2 PEA arrest/pain syndrome secondary to hypertonicity/spastic tetraplegia:  -Presumed 2/2 combination alcohol and BZD overdose with UDS positive for THC and BZDs -Do  not expect he will recover fully - long-term SNF placement recommended -Continue PT/OT/SLP -Continue baclofen, gabapentin, scheduled oxycodone and Zanaflex 4 mg QID -12/21 oxycodone frequency decreased from every 6 hours to every 8 hours due to sleepiness without any apparent worsening of  pain -Appreciate assistance of [name] given to Botox to right hamstrings, rectus femoris-and to recontact next week -Continue bilateral upper extremity WHO resting splints -Continue PROM per nursing staff every 4 hours -Continue low-dose Seroquel for brain injury sequela -Continue scheduled Tylenol for pain -Continue CREA muscle rub -Continue KREG rotational bed   Acute hypoxemic respiratory failure/new tracheostomy tube requirement/Corynebacterium tracheobronchitis -Hypoxemia now resolved-trach tube placed 10/3 -Continue PMV training per SLP -#4 cuffless trach-capping trial at discretion of trach team -[place] team following.  Not a candidate for decannulation until patient can reliably follow commands, cough and clear airway. -Sputum culture consistent with abundant Corynebacterium-continue linezolid for a total of 7 days (LD due 12/23)-discussed with ID [name] who agrees with this treatment regimen -continue Mucomyst nebs with albuterol nebs -continue physiotherapy with Vibra vest along with supervised PMV trials  HIV:  -CD4 count of 124 October 2021 (had been as high as 250 Dec 2020)-as of today up to 259 -Dc'd Tivicay and Descovy infavor of Biktarvy to for eventual dc to SNF; per my discussion with infectious disease physician [name] on 12/23 preferred regimen would be Descovy and Tivicay-she will contact the HIV pharmacist to attempt to obtain discount cards to make this a more affordable option.  Once this has been confirmed will transition back to Descovy and Tivicay -CD4 count on 12/23 up to 259 -ID recommends continuing prophylactic Bactrim an additional 3 months  Dysphagia 2/2 anoxic brain injury -Continue tube feeding per PEG -Repeat SLP evaluation on 11/23 with recommendation for n.p.o. status  Goals of care:  -Palliative care was involved during this hospitalization.   -Goals of care were discussed. remains full code.  - Palliative care recommended  outpatient follow-up with palliative providers. -May need to revisit end-of-life goals of care since patient making poor progress and currently having significant pain related to hypertonicity from brain injury  Protein calorie malnutrition nutrition Status: Nutrition Problem: Increased nutrient needs Etiology: acute illness Signs/Symptoms: estimated needs Interventions: Tube feeding bolus doses of Osmolite, Juven twice daily, Prosource TF 45 mL 3 times daily Estimated body mass index is 26.37 kg/m as calculated from the following:   Height as of this encounter: 5' 6"  (1.676 m).   Weight as of this encounter: 74.1 kg.   Multiple decubiti not POA Wound / Incision (Open or Dehisced) 11/17/19 Puncture Abdomen Left;Anterior;Upper G-tube insertion site  (Active)  Date First Assessed/Time First Assessed: 11/17/19 1646   Wound Type: Puncture  Location: Abdomen  Location Orientation: Left;Anterior;Upper  Wound Description (Comments): G-tube insertion site   Present on Admission: No    Assessments 11/17/2019  4:47 PM 01/23/2020  8:19 AM  Dressing Type Gauze (Comment);Tape dressing Gauze (Comment)  Dressing Changed New Changed  Dressing Status Clean;Dry;Intact Dry;Clean;Intact  Dressing Change Frequency PRN --  Site / Wound Assessment Clean;Dry;Pink --  Peri-wound Assessment Intact --  Closure None --  Drainage Amount None --  Treatment Cleansed --     No Linked orders to display      Other problems: Hypertension/grade 1 diastolic dysfunction:  -Continue amlodipine  -Echocardiogram this admission with preserved LVEF with evidence of grade   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   -Continue Keppra; levetiracetam level 35.9 on 11/12 -  no further seizure activity since transitioned out of ICU setting therefore will discontinue seizure precaution  Inguinal hernias -Evaluated by general surgery this admission. Documented as chronically incarcerated. Patient has been  clinically stable and tolerating tube feedings and having bowel movements so unless patient obstructed would not be considered a candidate for repair. Even if obstructed consulting surgeon was not sure he would be a candidate for hernia repair regardless .   Data Reviewed: Basic Metabolic Panel: Recent Labs  Lab 01/24/20 0120  NA 145  K 4.0  CL 107  CO2 27  GLUCOSE 101*  BUN 21*  CREATININE 0.66  CALCIUM 10.0   Liver Function Tests: No results for input(s): AST, ALT, ALKPHOS, BILITOT, PROT, ALBUMIN in the last 168 hours. No results for input(s): LIPASE, AMYLASE in the last 168 hours. No results for input(s): AMMONIA in the last 168 hours. CBC: No results for input(s): WBC, NEUTROABS, HGB, HCT, MCV, PLT in the last 168 hours. Cardiac Enzymes: No results for input(s): CKTOTAL, CKMB, CKMBINDEX, TROPONINI in the last 168 hours. BNP (last 3 results) No results for input(s): BNP in the last 8760 hours.  ProBNP (last 3 results) No results for input(s): PROBNP in the last 8760 hours.  CBG: Recent Labs  Lab 01/24/20 1606 01/24/20 1921 01/24/20 2304 01/25/20 0322 01/25/20 0729  GLUCAP 88 182* 122* 95 90    Recent Results (from the past 240 hour(s))  Culture, respiratory (non-expectorated)     Status: None   Collection Time: 01/16/20  9:30 AM   Specimen: Tracheal Aspirate; Respiratory  Result Value Ref Range Status   Specimen Description TRACHEAL ASPIRATE  Final   Special Requests NONE  Final   Gram Stain   Final    MODERATE WBC PRESENT,BOTH PMN AND MONONUCLEAR MODERATE GRAM POSITIVE RODS FEW GRAM NEGATIVE RODS    Culture   Final    ABUNDANT CORYNEBACTERIUM STRIATUM Standardized susceptibility testing for this organism is not available. Performed at [facility], [place] [address], [place], [place] [address]    Report Status 01/18/2020 FINAL  Final     Studies: No results found.  Scheduled Meds: . acetaminophen (TYLENOL) oral liquid 160 mg/5 mL  650 mg Per  Tube Q6H  . amLODipine  10 mg Per Tube Daily  . baclofen  30 mg Per Tube TID  . chlorhexidine gluconate (MEDLINE KIT)  15 mL Mouth Rinse BID  . Darunavir-Cobicisctat-Emtricitabine-Tenofovir Alafenamide  1 tablet Oral Q breakfast  . famotidine  10.4 mg Per Tube BID  . feeding supplement (OSMOLITE 1.5 CAL)  237 mL Per Tube Q24H  . feeding supplement (OSMOLITE 1.5 CAL)  474 mL Per Tube TID  . feeding supplement (PROSource TF)  45 mL Per Tube TID  . fiber  1 packet Per Tube BID  . folic acid  1 mg Per Tube Daily  . free water  200 mL Per Tube Q4H  . gabapentin  200 mg Per Tube Q8H  . heparin injection (subcutaneous)  5,000 Units Subcutaneous Q8H  . levETIRAcetam  1,500 mg Per Tube BID  . linezolid  600 mg Per Tube Q12H  . Muscle Rub   Topical QID  . oxyCODONE  5 mg Per Tube Q8H  . QUEtiapine  12.5 mg Per Tube QHS  . scopolamine  1 patch Transdermal Q72H  . sulfamethoxazole-trimethoprim  1 tablet Per Tube Once per day on Mon Wed Fri  . thiamine  100 mg Per Tube Daily  . tiZANidine  4 mg Per  Tube QID   Continuous Infusions:   Active Problems:   Cardiac arrest [facility])   Community acquired pneumonia of left upper lobe of lung   Anoxic brain injury ([place])   Alcohol abuse   Acute respiratory failure with hypoxia ([facility])   Vomiting   Pressure injury of skin   Small bowel obstruction ([place])   Palliative care encounter   Bowel obstruction ([facility])   Chronic respiratory failure ([facility])   Diastolic dysfunction   Tracheostomy dependent ([place])   Dysphagia   Protein-calorie malnutrition ([place])   Seizures ([place])   Bilateral recurrent inguinal hernia   Muscle spasticity   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[place])   Tracheobronchitis   Sequela of Corynebacterium infection   Consultants:  PCCM  Palliative medicine  General surgery  Neurology  Interventional radiology  Procedures:  EEG  Echocardiogram  Tracheostomy  Antibiotics: Anti-infectives (From admission, onward)    Start     Dose/Rate Route Frequency Ordered Stop   01/19/20 2200  linezolid (ZYVOX) tablet 600 mg        600 mg Per Tube Every 12 hours 01/19/20 1534 01/26/20 2159   01/19/20 1100  levofloxacin (LEVAQUIN) tablet 500 mg  Status:  Discontinued        500 mg Per Tube Daily 01/19/20 1004 01/19/20 1534   12/19/19 0930  Darunavir-Cobicisctat-Emtricitabine-Tenofovir Alafenamide (SYMTUZA) 800-150-200-10 MG TABS 1 tablet        1 tablet Oral Daily with breakfast 12/19/19 0840     12/13/19 1000  bictegravir-emtricitabine-tenofovir AF (BIKTARVY) 50-200-25 MG per tablet 1 tablet  Status:  Discontinued        1 tablet Oral Daily 12/12/19 1413 12/19/19 0840   11/17/19 1548  ceFAZolin (ANCEF) 2-4 GM/100ML-% IVPB       Note to Pharmacy: Domenick Bookbinder   : cabinet override      11/17/19 1548 11/18/19 0359   11/16/19 1515  ceFAZolin (ANCEF) IVPB 2g/100 mL premix        2 g 200 mL/hr over 30 Minutes Intravenous To Radiology 11/16/19 1511 11/17/19 1625   11/15/19 0900  sulfamethoxazole-trimethoprim (BACTRIM DS) 800-160 MG per tablet 1 tablet        1 tablet Per Tube Once per day on Mon Wed Fri 11/13/19 1906     11/13/19 1615  dolutegravir (TIVICAY) tablet 50 mg  Status:  Discontinued        50 mg Oral Daily 11/13/19 1521 12/12/19 1413   11/13/19 1615  emtricitabine-tenofovir AF (DESCOVY) 200-25 MG per tablet 1 tablet  Status:  Discontinued        1 tablet Per Tube Daily 11/13/19 1521 12/12/19 1413   11/13/19 1530  sulfamethoxazole-trimethoprim (BACTRIM DS) 800-160 MG per tablet 1 tablet  Status:  Discontinued        1 tablet Oral Once per day on Mon Wed Fri 11/13/19 1441 11/13/19 1906   11/13/19 1500  bictegravir-emtricitabine-tenofovir AF (BIKTARVY) 50-200-25 MG per tablet 1 tablet  Status:  Discontinued        1 tablet Oral Daily 11/13/19 1403 11/13/19 1521   11/10/19 1000  erythromycin 250 mg in sodium chloride 0.9 % 100 mL IVPB        250 mg 100 mL/hr over 60 Minutes Intravenous Every 8 hours 11/10/19  0907 11/11/19 2000   11/07/19 1200  ceFEPIme (MAXIPIME) 2 g in sodium chloride 0.9 % 100 mL IVPB        2 g 200 mL/hr over 30 Minutes Intravenous Every 8 hours 11/07/19  1013 11/13/19 2127   11/03/19 1200  cefTRIAXone (ROCEPHIN) 2 g in sodium chloride 0.9 % 100 mL IVPB        2 g 200 mL/hr over 30 Minutes Intravenous Every 24 hours 11/03/19 0922 11/05/19 1427   11/02/19 0800  vancomycin (VANCOREADY) IVPB 750 mg/150 mL  Status:  Discontinued        750 mg 150 mL/hr over 60 Minutes Intravenous Every 12 hours 11/01/19 1935 11/02/19 1105   11/02/19 0600  piperacillin-tazobactam (ZOSYN) IVPB 3.375 g  Status:  Discontinued        3.375 g 12.5 mL/hr over 240 Minutes Intravenous Every 8 hours 11/02/19 0311 11/03/19 0920   11/01/19 2200  ceFEPIme (MAXIPIME) 2 g in sodium chloride 0.9 % 100 mL IVPB  Status:  Discontinued        2 g 200 mL/hr over 30 Minutes Intravenous Every 12 hours 11/01/19 2143 11/02/19 0311   11/01/19 1915  vancomycin (VANCOREADY) IVPB 1500 mg/300 mL        1,500 mg 150 mL/hr over 120 Minutes Intravenous  Once 11/01/19 1909 11/01/19 2147   11/01/19 1845  cefTRIAXone (ROCEPHIN) 1 g in sodium chloride 0.9 % 100 mL IVPB        1 g 200 mL/hr over 30 Minutes Intravenous  Once 11/01/19 1842 11/01/19 2051   11/01/19 1845  azithromycin (ZITHROMAX) 500 mg in sodium chloride 0.9 % 250 mL IVPB        500 mg 250 mL/hr over 60 Minutes Intravenous  Once 11/01/19 1842 11/01/19 2051       Time spent: 20 minutes    Erin Hearing ANP  [facility]  7 am-330 pm/M-F 01/25/2020, 10:23 AM  LOS: 85 days

## 2020-01-25 NOTE — Plan of Care (Signed)
  Problem: Elimination: Goal: Will not experience complications related to bowel motility Outcome: Progressing Goal: Will not experience complications related to urinary retention Outcome: Progressing   Problem: Pain Managment: Goal: General experience of comfort will improve Outcome: Progressing   Problem: Safety: Goal: Ability to remain free from injury will improve Outcome: Progressing   

## 2020-01-26 ENCOUNTER — Inpatient Hospital Stay (HOSPITAL_COMMUNITY): Payer: Medicaid Other

## 2020-01-26 LAB — GLUCOSE, CAPILLARY
Glucose-Capillary: 105 mg/dL — ABNORMAL HIGH (ref 70–99)
Glucose-Capillary: 106 mg/dL — ABNORMAL HIGH (ref 70–99)
Glucose-Capillary: 105 mg/dL — ABNORMAL HIGH (ref 70–99)
Glucose-Capillary: 123 mg/dL — ABNORMAL HIGH (ref 70–99)
Glucose-Capillary: 126 mg/dL — ABNORMAL HIGH (ref 70–99)

## 2020-01-26 NOTE — Progress Notes (Signed)
Physical Therapy Treatment Patient Details Name: Ryan Orozco MRN: [id number] DOB: [birth year] Today's Date: 01/26/2020    History of Present Illness 54 year old male found down outside convenience store 9/27. Pt with PEA and hypoxic brain injury. Trach 10/13, PEG 10/15. PMHx: HIV AIDS and alcohol abuse    PT Comments    Pt seen in conjunction with OT with pt with improved command following for sitting balance and LUE use in recliner. Pt wheeled around unit in chair before return to room with Dawud saying "hey" to all he saw. Pt with improved posture in chair but unable to stay in recliner due to tone and lack of supervision. Pt remains limited by spasticity. Will continue to follow.   Pt SpO2 95% on RA throughout session    Follow Up Recommendations  SNF     Equipment Recommendations  Hospital bed    Recommendations for Other Services       Precautions / Restrictions Precautions Precautions: Fall Precaution Comments: trach, PEG, anoxic, flexor tone R>L LE/UE    Mobility  Bed Mobility Overal bed mobility: Needs Assistance Bed Mobility: Rolling;Supine to Sit;Sit to Supine Rolling: Total assist;+2 for physical assistance   Supine to sit: Total assist;+2 for physical assistance Sit to supine: Total assist;+2 for physical assistance   General bed mobility comments: total assist for rolling in bed with utilization of flexor tone to assist rolling. Total assist to transition from supine to sitting. Sitting to supine via maximove  Transfers Overall transfer level: Needs assistance   Transfers: Lateral/Scoot Transfers          Lateral/Scoot Transfers: Total assist;+2 physical assistance General transfer comment: lifted to recliner with 2 person assist with use of pad to cradle sacrum to work on sitting balance (due to airbed pt is in will not totally deflate and it too high for his feet to touch floor. Maximove to transition from recliner back to bed. Unsafe to leave  in chair without sitter  Ambulation/Gait             General Gait Details: unable   Stairs             Wheelchair Mobility    Modified Rankin (Stroke Patients Only)       Balance Overall balance assessment: Needs assistance Sitting-balance support:  (R LE unsupported (even in recliner) due to flexion tone; most of time Bil UEs not supported either due to tone)   Sitting balance - Comments: Pt varied from poor to zero (difficulty grading movements, but can do it with cues). Sat in recliner and worked on coming foreward (tendency forward and to right), then leaning left, then leaning back against back rest (7 reps) Postural control: Posterior lean;Right lateral lean     Standing balance comment: unable to assess                            Cognition Arousal/Alertness: Awake/alert Behavior During Therapy: WFL for tasks assessed/performed Overall Cognitive Status: Impaired/Different from baseline Area of Impairment: Attention;Following commands;Safety/judgement;Problem solving               Rancho Levels of Cognitive Functioning Ranch[place] Amigos Scales of Cognitive Functioning: Localized response   Current Attention Level: Sustained   Following Commands: Follows one step commands inconsistently;Follows one step commands with increased time Safety/Judgement: Decreased awareness of safety;Decreased awareness of deficits   Problem Solving: Slow processing;Decreased initiation;Difficulty sequencing;Requires verbal cues;Requires tactile cues General Comments: He was able to  follow 1 step commands 75% of time to move UEs, inconsistent with moving just LLE while seated in recliner. Smiling alot during session. Will respond to say "hey" when cued      Exercises General Exercises - Lower Extremity Long Arc Quad: AAROM;Left;Seated;5 reps Other Exercises Other Exercises: passive stretch to RLE with inability to achieve extension gaining grossly 15 degrees  of knee and hip extension with ROM. Heavy flexor tone    General Comments        Pertinent Vitals/Pain Pain Assessment: Faces Pain Score: 5  Faces Pain Scale: Hurts even more Pain Location: RLE with stretching (high tone) Pain Descriptors / Indicators: Grimacing;Guarding;Moaning Pain Intervention(s): Limited activity within patient's tolerance;Monitored during session;Repositioned    Home Living                      Prior Function            PT Goals (current goals can now be found in the care plan section) Acute Rehab PT Goals Patient Stated Goal: unable to state Progress towards PT goals: Progressing toward goals    Frequency    Min 2X/week      PT Plan Current plan remains appropriate    Co-evaluation PT/OT/SLP Co-Evaluation/Treatment: Yes Reason for Co-Treatment: Complexity of the patient's impairments (multi-system involvement);For patient/therapist safety PT goals addressed during session: Mobility/safety with mobility;Balance;Strengthening/ROM OT goals addressed during session: ADL's and self-care;Strengthening/ROM      AM-PAC PT "6 Clicks" Mobility   Outcome Measure  Help needed turning from your back to your side while in a flat bed without using bedrails?: Total Help needed moving from lying on your back to sitting on the side of a flat bed without using bedrails?: Total Help needed moving to and from a bed to a chair (including a wheelchair)?: Total Help needed standing up from a chair using your arms (e.g., wheelchair or bedside chair)?: Total Help needed to walk in hospital room?: Total Help needed climbing 3-5 steps with a railing? : Total 6 Click Score: 6    End of Session   Activity Tolerance: Patient tolerated treatment well Patient left: in bed;with call bell/phone within reach;with bed alarm set Nurse Communication: Mobility status;Need for lift equipment PT Visit Diagnosis: Other abnormalities of gait and mobility (R26.89);Muscle  weakness (generalized) (M62.81)     Time: 3335-4562 PT Time Calculation (min) (ACUTE ONLY): 49 min  Charges:  $Therapeutic Activity: 23-37 mins                     Elbert Polyakov P, PT Acute Rehabilitation Services Pager: [phone] Office: [phone]    Enedina Finner Kurtis Anastasia 01/26/2020, 9:38 AM

## 2020-01-26 NOTE — Progress Notes (Signed)
[facility] PROGRESS NOTE  Ryan Orozco CBS:[id number] DOB: [birth year] DOA: 11/01/2019 PCP: Default, Provider, MD    11/16   Status: Remains inpatient appropriate because:Altered mental status, Unsafe d/c plan and Inpatient level of care appropriate due to severity of illness. Patient newly diagnosed with anoxic brain injury  Dispo: The patient is from: Home              Anticipated d/c is to: SNF              Anticipated d/c date is: > 3 days              Patient currently is medically stable to d/c.  Barriers to discharge: No SNF bed offers.  Needs trach capable facility. TOC communicating with financial counseling team who have yet to hear back from DSS regarding patient's assigned caseworker.   Code Status: Full Family Communication: 12/15 dtr Ryan Orozco by telephone DVT prophylaxis: Subcutaneous heparin Vaccination status: Covid vaccination status unknown  Foley catheter: No  HPI: 54 year old male with HIV, chronic alcohol abuse who was presented to the emergency department after being found unresponsive outside a convinient store.  EMS found him pulseless in PEA, unknown downtime, successfully resuscitated and brought to the emergency department.  UDS positive for THC/benzodiazepines.  CT head unremarkable.  Failed extubation trials due to severe anoxic brain injury and had tracheostomy placed.  PEG placed on 10/15.  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  This has led to significant pain as well.  [name] was consulted and recommendations/input/orders highly appreciated.  She may also benefit from Botox injections to both hips to aid in treatment of spasticity.   Significant Hospital Events   9/29 Admit post PEA arrest. UDS positive for THC, benzo's. ETOH 177 10/05 EEG ongoing. Versed restarted overnight due to agitation. Nicardipine increased.  10/06 Developed tachypnea with WUA,  no follow commands off sedation  10/07 Vomiting, TF held / restarted with recurrent vomiting 10/16 tracheostomy collar for 9 hours 10/18->10/19 off vent all night  10/20> TC 11/15 > downsized to #4 Shiley flex CFS  Subjective: Sleeping soundly so did not awaken.  Objective: Vitals:   01/26/20 0518 01/26/20 0933  BP: 116/89   Pulse: 81   Resp: 19   Temp: 98 F (36.7 C)   SpO2: 97% 95%    Intake/Output Summary (Last 24 hours) at 01/26/2020 1031 Last data filed at 01/26/2020 0700 Gross per 24 hour  Intake 1600 ml  Output --  Net 1600 ml   Filed Weights   01/21/20 0500 01/22/20 0412 01/23/20 0700  Weight: 74 kg 72 kg 74.1 kg    Exam: General: Sleeping soundly, appears comfortable and in no acute distress Pulmonary: Bilateral lung sounds CTA anteriorly, no increased work of breathing, trach with minimal secretions.  FiO2 21% Cardiac: Heart sounds S1-S2, regular pulse, no peripheral edema Abdomen: Soft, nontender nondistended.  Bowel sounds are auscultated.  PEG tube insertion site unremarkable.  Tube feeding infusing. Neurological: Sleeping soundly. At baseline continues with hypertonicity of both upper and lower extremities worse on the right side.  Spontaneous gross motor activity involving the upper extremities without any fine motor such as fingers or hands.   Assessment/Plan: Acute problems: Anoxic brain injury 2/2 PEA arrest/pain syndrome secondary to hypertonicity/spastic tetraplegia:  -Presumed 2/2 combination alcohol and BZD overdose with UDS positive for THC and BZDs -Do not expect he will recover fully - long-term SNF placement recommended -Continue PT/OT/SLP -  Continue baclofen, gabapentin, scheduled oxycodone and Zanaflex 4 mg QID -Appreciate assistance of [name] given to Botox to right hamstrings, rectus femoris-and to recontact next week -Continue bilateral upper extremity WHO resting splints -Continue PROM per nursing staff every 4  hours -Continue low-dose Seroquel for brain injury sequela -Continue scheduled Tylenol for pain -Continue CREA muscle rub -Continue KREG rotational bed   Acute hypoxemic respiratory failure/new tracheostomy tube requirement/Corynebacterium tracheobronchitis -Hypoxemia (resolved)-trach tube placed 10/3 -Continue PMV training per SLP -#4 cuffless trach-capping trial at discretion of trach team -[name] team following.  Not a candidate for decannulation until patient can reliably follow commands, cough and clear airway. -Sputum culture consistent with abundant Corynebacterium-continue linezolid for a total of 7 days (LD due 12/23)-discussed with ID [name] who agrees with this treatment regimen -continue Mucomyst nebs with albuterol nebs -continue physiotherapy with Vibra vest along with supervised PMV trials -Follow-up chest x-ray on 12/24 with low lung volumes but no acute cardiopulmonary findings  HIV:  -CD4 count of 124 October 2021 (had been as high as 250 Dec 2020)-as of today up to 259 -Dc'd Tivicay and Descovy infavor of Biktarvy to for eventual dc to SNF; per my discussion with infectious disease physician [name] on 12/23 preferred regimen would be Descovy and Tivicay-she will contact the HIV pharmacist to attempt to obtain discount cards to make this a more affordable option.  Once this has been confirmed will transition back to Descovy and Tivicay -CD4 count on 12/23 up to 259 -ID recommends continuing prophylactic Bactrim an additional 3 months  Dysphagia 2/2 anoxic brain injury -Continue tube feeding per PEG -Repeat SLP evaluation on 11/23 with recommendation for n.p.o. status  Goals of care:  -Palliative care was involved during this hospitalization.   -Goals of care were discussed. remains full code.  - Palliative care recommended outpatient follow-up with palliative providers. -May need to revisit end-of-life goals of care since patient making poor progress and  currently having significant pain related to hypertonicity from brain injury  Protein calorie malnutrition nutrition Status: Nutrition Problem: Increased nutrient needs Etiology: acute illness Signs/Symptoms: estimated needs Interventions: Tube feeding bolus doses of Osmolite, Juven twice daily, Prosource TF 45 mL 3 times daily Estimated body mass index is 26.37 kg/m as calculated from the following:   Height as of this encounter: 5' 6"  (1.676 m).   Weight as of this encounter: 74.1 kg.   Multiple decubiti not POA Wound / Incision (Open or Dehisced) 11/17/19 Puncture Abdomen Left;Anterior;Upper G-tube insertion site  (Active)  Date First Assessed/Time First Assessed: 11/17/19 1646   Wound Type: Puncture  Location: Abdomen  Location Orientation: Left;Anterior;Upper  Wound Description (Comments): G-tube insertion site   Present on Admission: No    Assessments 11/17/2019  4:47 PM 01/23/2020  8:19 AM  Dressing Type Gauze (Comment);Tape dressing Gauze (Comment)  Dressing Changed New Changed  Dressing Status Clean;Dry;Intact Dry;Clean;Intact  Dressing Change Frequency PRN --  Site / Wound Assessment Clean;Dry;Pink --  Peri-wound Assessment Intact --  Closure None --  Drainage Amount None --  Treatment Cleansed --     No Linked orders to display      Other problems: Hypertension/grade 1 diastolic dysfunction:  -Continue amlodipine  -Echocardiogram this admission with preserved LVEF with evidence of grade   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   -Continue Keppra; levetiracetam level 35.9 on 11/12 -no further seizure activity since transitioned out of ICU setting therefore will discontinue seizure precaution  Inguinal hernias -Evaluated by general  surgery this admission. Documented as chronically incarcerated. Patient has been clinically stable and tolerating tube feedings and having bowel movements so unless patient obstructed would not be considered a candidate  for repair. Even if obstructed consulting surgeon was not sure he would be a candidate for hernia repair regardless .   Data Reviewed: Basic Metabolic Panel: Recent Labs  Lab 01/24/20 0120  NA 145  K 4.0  CL 107  CO2 27  GLUCOSE 101*  BUN 21*  CREATININE 0.66  CALCIUM 10.0   Liver Function Tests: No results for input(s): AST, ALT, ALKPHOS, BILITOT, PROT, ALBUMIN in the last 168 hours. No results for input(s): LIPASE, AMYLASE in the last 168 hours. No results for input(s): AMMONIA in the last 168 hours. CBC: No results for input(s): WBC, NEUTROABS, HGB, HCT, MCV, PLT in the last 168 hours. Cardiac Enzymes: No results for input(s): CKTOTAL, CKMB, CKMBINDEX, TROPONINI in the last 168 hours. BNP (last 3 results) No results for input(s): BNP in the last 8760 hours.  ProBNP (last 3 results) No results for input(s): PROBNP in the last 8760 hours.  CBG: Recent Labs  Lab 01/25/20 0729 01/25/20 1915 01/25/20 2334 01/26/20 0327 01/26/20 0729  GLUCAP 90 118* 96 105* 106*    No results found for this or any previous visit (from the past 240 hour(s)).   Studies: DG Chest Port 1 View  Result Date: 01/26/2020 CLINICAL DATA:  Shortness of breath. EXAM: PORTABLE CHEST 1 VIEW COMPARISON:  01/08/2020 FINDINGS: 0732 hours. Tracheostomy tube again noted. Low lung volumes. The lungs are clear without focal pneumonia, edema, pneumothorax or pleural effusion. Interstitial markings are diffusely coarsened with chronic features. Cardiopericardial silhouette is at upper limits of normal for size. IMPRESSION: Low volume film without acute cardiopulmonary findings. Electronically Signed   By: Misty Stanley M.D.   On: 01/26/2020 08:01    Scheduled Meds: . acetaminophen (TYLENOL) oral liquid 160 mg/5 mL  650 mg Per Tube Q6H  . amLODipine  10 mg Per Tube Daily  . baclofen  30 mg Per Tube TID  . chlorhexidine gluconate (MEDLINE KIT)  15 mL Mouth Rinse BID  .  Darunavir-Cobicisctat-Emtricitabine-Tenofovir Alafenamide  1 tablet Oral Q breakfast  . famotidine  10.4 mg Per Tube BID  . feeding supplement (OSMOLITE 1.5 CAL)  237 mL Per Tube Q24H  . feeding supplement (OSMOLITE 1.5 CAL)  474 mL Per Tube TID  . feeding supplement (PROSource TF)  45 mL Per Tube TID  . fiber  1 packet Per Tube BID  . folic acid  1 mg Per Tube Daily  . free water  200 mL Per Tube Q4H  . gabapentin  200 mg Per Tube Q8H  . heparin injection (subcutaneous)  5,000 Units Subcutaneous Q8H  . levETIRAcetam  1,500 mg Per Tube BID  . Muscle Rub   Topical QID  . oxyCODONE  5 mg Per Tube Q8H  . QUEtiapine  12.5 mg Per Tube QHS  . scopolamine  1 patch Transdermal Q72H  . sulfamethoxazole-trimethoprim  1 tablet Per Tube Once per day on Mon Wed Fri  . thiamine  100 mg Per Tube Daily  . tiZANidine  4 mg Per Tube QID   Continuous Infusions:   Active Problems:   Cardiac arrest [facility])   Community acquired pneumonia of left upper lobe of lung   Anoxic brain injury ([place])   Alcohol abuse   Acute respiratory failure with hypoxia ([facility])   Vomiting   Pressure injury of skin  Small bowel obstruction ([facility])   Palliative care encounter   Bowel obstruction ([facility])   Chronic respiratory failure ([facility])   Diastolic dysfunction   Tracheostomy dependent (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[place])   Tracheobronchitis   Sequela of Corynebacterium infection   Consultants:  PCCM  Palliative medicine  General surgery  Neurology  Interventional radiology  Procedures:  EEG  Echocardiogram  Tracheostomy  Antibiotics: Anti-infectives (From admission, onward)   Start     Dose/Rate Route Frequency Ordered Stop   01/19/20 2200  linezolid (ZYVOX) tablet 600 mg        600 mg Per Tube Every 12 hours 01/19/20 1534 01/25/20 2019   01/19/20 1100   levofloxacin (LEVAQUIN) tablet 500 mg  Status:  Discontinued        500 mg Per Tube Daily 01/19/20 1004 01/19/20 1534   12/19/19 0930  Darunavir-Cobicisctat-Emtricitabine-Tenofovir Alafenamide (SYMTUZA) 800-150-200-10 MG TABS 1 tablet        1 tablet Oral Daily with breakfast 12/19/19 0840     12/13/19 1000  bictegravir-emtricitabine-tenofovir AF (BIKTARVY) 50-200-25 MG per tablet 1 tablet  Status:  Discontinued        1 tablet Oral Daily 12/12/19 1413 12/19/19 0840   11/17/19 1548  ceFAZolin (ANCEF) 2-4 GM/100ML-% IVPB       Note to Pharmacy: Domenick Bookbinder   : cabinet override      11/17/19 1548 11/18/19 0359   11/16/19 1515  ceFAZolin (ANCEF) IVPB 2g/100 mL premix        2 g 200 mL/hr over 30 Minutes Intravenous To Radiology 11/16/19 1511 11/17/19 1625   11/15/19 0900  sulfamethoxazole-trimethoprim (BACTRIM DS) 800-160 MG per tablet 1 tablet        1 tablet Per Tube Once per day on Mon Wed Fri 11/13/19 1906     11/13/19 1615  dolutegravir (TIVICAY) tablet 50 mg  Status:  Discontinued        50 mg Oral Daily 11/13/19 1521 12/12/19 1413   11/13/19 1615  emtricitabine-tenofovir AF (DESCOVY) 200-25 MG per tablet 1 tablet  Status:  Discontinued        1 tablet Per Tube Daily 11/13/19 1521 12/12/19 1413   11/13/19 1530  sulfamethoxazole-trimethoprim (BACTRIM DS) 800-160 MG per tablet 1 tablet  Status:  Discontinued        1 tablet Oral Once per day on Mon Wed Fri 11/13/19 1441 11/13/19 1906   11/13/19 1500  bictegravir-emtricitabine-tenofovir AF (BIKTARVY) 50-200-25 MG per tablet 1 tablet  Status:  Discontinued        1 tablet Oral Daily 11/13/19 1403 11/13/19 1521   11/10/19 1000  erythromycin 250 mg in sodium chloride 0.9 % 100 mL IVPB        250 mg 100 mL/hr over 60 Minutes Intravenous Every 8 hours 11/10/19 0907 11/11/19 2000   11/07/19 1200  ceFEPIme (MAXIPIME) 2 g in sodium chloride 0.9 % 100 mL IVPB        2 g 200 mL/hr over 30 Minutes Intravenous Every 8 hours 11/07/19 1013 11/13/19  2127   11/03/19 1200  cefTRIAXone (ROCEPHIN) 2 g in sodium chloride 0.9 % 100 mL IVPB        2 g 200 mL/hr over 30 Minutes Intravenous Every 24 hours 11/03/19 0922 11/05/19 1427   11/02/19 0800  vancomycin (VANCOREADY) IVPB 750 mg/150 mL  Status:  Discontinued        750 mg 150 mL/hr over 60 Minutes Intravenous Every 12 hours 11/01/19 1935 11/02/19 1105   11/02/19 0600  piperacillin-tazobactam (ZOSYN) IVPB 3.375 g  Status:  Discontinued        3.375 g 12.5 mL/hr over 240 Minutes Intravenous Every 8 hours 11/02/19 0311 11/03/19 0920   11/01/19 2200  ceFEPIme (MAXIPIME) 2 g in sodium chloride 0.9 % 100 mL IVPB  Status:  Discontinued        2 g 200 mL/hr over 30 Minutes Intravenous Every 12 hours 11/01/19 2143 11/02/19 0311   11/01/19 1915  vancomycin (VANCOREADY) IVPB 1500 mg/300 mL        1,500 mg 150 mL/hr over 120 Minutes Intravenous  Once 11/01/19 1909 11/01/19 2147   11/01/19 1845  cefTRIAXone (ROCEPHIN) 1 g in sodium chloride 0.9 % 100 mL IVPB        1 g 200 mL/hr over 30 Minutes Intravenous  Once 11/01/19 1842 11/01/19 2051   11/01/19 1845  azithromycin (ZITHROMAX) 500 mg in sodium chloride 0.9 % 250 mL IVPB        500 mg 250 mL/hr over 60 Minutes Intravenous  Once 11/01/19 1842 11/01/19 2051       Time spent: 20 minutes    Erin Hearing ANP  [facility]  7 am-330 pm/M-F 01/26/2020, 10:31 AM  LOS: 86 days

## 2020-01-26 NOTE — Plan of Care (Signed)
  Problem: Education: Goal: Knowledge of General Education information will improve Description: Including pain rating scale, medication(s)/side effects and non-pharmacologic comfort measures Outcome: Progressing   Problem: Health Behavior/Discharge Planning: Goal: Ability to manage health-related needs will improve Outcome: Progressing   Problem: Clinical Measurements: Goal: Ability to maintain clinical measurements within normal limits will improve Outcome: Progressing Goal: Will remain free from infection Outcome: Progressing Goal: Diagnostic test results will improve Outcome: Progressing Goal: Respiratory complications will improve Outcome: Progressing Goal: Cardiovascular complication will be avoided Outcome: Progressing   Problem: Activity: Goal: Risk for activity intolerance will decrease Outcome: Progressing   Problem: Nutrition: Goal: Adequate nutrition will be maintained Outcome: Progressing   Problem: Coping: Goal: Level of anxiety will decrease Outcome: Progressing   Problem: Elimination: Goal: Will not experience complications related to bowel motility Outcome: Progressing Goal: Will not experience complications related to urinary retention Outcome: Progressing   Problem: Pain Managment: Goal: General experience of comfort will improve Outcome: Progressing   Problem: Safety: Goal: Ability to remain free from injury will improve Outcome: Progressing   Problem: Skin Integrity: Goal: Risk for impaired skin integrity will decrease Outcome: Progressing   Problem: Education: Goal: 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 Outcome: Progressing   Problem: Activity: Goal: Ability to tolerate increased activity will improve Outcome: Progressing   Problem: Health Behavior/Discharge Planning: Goal: 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 Outcome: Progressing   Problem: Respiratory: Goal: Patent airway maintenance will  improve Outcome: Progressing   Problem: Role Relationship: Goal: Ability to communicate will improve Outcome: Progressing   Problem: Education: Goal: Knowledge of the prescribed therapeutic regimen Outcome: Progressing Goal: Knowledge of disease or condition will improve Outcome: Progressing   Problem: Clinical Measurements: Goal: Neurologic status will improve Outcome: Progressing   Problem: Tissue Perfusion: Goal: Ability to maintain intracranial pressure will improve Outcome: Progressing   Problem: Respiratory: Goal: Will regain and/or maintain adequate ventilation Outcome: Progressing   Problem: Skin Integrity: Goal: Risk for impaired skin integrity will decrease Outcome: Progressing Goal: Demonstration of wound healing without infection will improve Outcome: Progressing   Problem: Psychosocial: Goal: Ability to verbalize positive feelings about self will improve Outcome: Progressing Goal: Ability to participate in self-care as condition permits will improve Outcome: Progressing Goal: Ability to identify appropriate support needs will improve Outcome: Progressing   Problem: Health Behavior/Discharge Planning: Goal: Ability to manage health-related needs will improve Outcome: Progressing   Problem: Nutritional: Goal: Risk of aspiration will decrease Outcome: Progressing Goal: Dietary intake will improve Outcome: Progressing   Problem: Communication: Goal: Ability to communicate needs accurately will improve Outcome: Progressing   

## 2020-01-26 NOTE — Progress Notes (Signed)
Occupational Therapy Treatment Patient Details Name: Ryan Orozco MRN: [id number] DOB: [birth year] Today's Date: 01/26/2020    History of present illness 54 year old male found down outside convenience store 9/27. Pt with PEA and hypoxic brain injury. Trach 10/13, PEG 10/15. PMHx: HIV AIDS and alcohol abuse   OT comments  This 54 yo male admitted with above presents to acute OT with making progress with following commands and moving Bil UEs on command (but really has to focus on task at hand and body does not always want to cooperate due to tone). He did well sitting in recliner and working on trunk movement (but with difficulty grading movement). He will continue to benefit from acute OT with follow up at SNF.  Follow Up Recommendations  SNF;Supervision/Assistance - 24 hour    Equipment Recommendations  None recommended by OT       Precautions / Restrictions Precautions Precaution Comments: trach, PEG, anoxic, flexor tone R>L LE/UE       Mobility Bed Mobility Overal bed mobility: Needs Assistance Bed Mobility: Rolling;Supine to Sit;Sit to Supine     Supine to sit: Total assist;+2 for physical assistance Sit to supine: Total assist;+2 for physical assistance      Transfers Overall transfer level: Needs assistance               General transfer comment: lifted to recliner to work on sitting balance (due to airbed pt is in will not totally deflate and it too high for his feet to touch floor.    Balance Overall balance assessment: Needs assistance Sitting-balance support:  (R LE unsupported (even in recliner) due to flexion tone; most of time Bil UEs not supported either due to tone)   Sitting balance - Comments: Pt varied from poor to zero (difficulty grading movements, but can do it with cues). Sat in recliner and worked on coming foreward (tendency forward and to right), then leaning left, then leaning back against back rest (7 reps)                                    ADL either performed or assessed with clinical judgement   ADL Overall ADL's : Needs assistance/impaired     Grooming: Wash/dry face Grooming Details (indicate cue type and reason): max A hand over hand--would place washcloth in hand and then help him get it to face--he would then wipe downwards                                     Vision Patient Visual Report: No change from baseline            Cognition Arousal/Alertness: Awake/alert Behavior During Therapy: WFL for tasks assessed/performed Overall Cognitive Status: Impaired/Different from baseline Area of Impairment: Attention;Following commands;Safety/judgement;Problem solving               Rancho Levels of Cognitive Functioning Ranch[place]igos Scales of Cognitive Functioning: Localized response   Current Attention Level: Sustained   Following Commands: Follows one step commands inconsistently;Follows one step commands with increased time Safety/Judgement: Decreased awareness of safety;Decreased awareness of deficits   Problem Solving: Slow processing;Decreased initiation;Difficulty sequencing;Requires verbal cues;Requires tactile cues General Comments: He was able to follow 1 step commands 75% of time to move UEs, inconsistent with moving just LLE while seated in recliner. Smiling alot during session.  Pertinent Vitals/ Pain       Pain Assessment: Faces Faces Pain Scale: Hurts even more Pain Location: RLE with stretching (high tone) Pain Descriptors / Indicators: Grimacing;Guarding;Moaning Pain Intervention(s): Limited activity within patient's tolerance         Frequency  Min 2X/week        Progress Toward Goals  OT Goals(current goals can now be found in the care plan section)  Progress towards OT goals: Progressing toward goals  Acute Rehab OT Goals Patient Stated Goal: unable to state OT Goal Formulation: Patient unable to participate  in goal setting Time For Goal Achievement: 02/07/20 Potential to Achieve Goals: Fair  Plan Discharge plan remains appropriate    Co-evaluation    PT/OT/SLP Co-Evaluation/Treatment: Yes Reason for Co-Treatment: Necessary to address cognition/behavior during functional activity;For patient/therapist safety;To address functional/ADL transfers PT goals addressed during session: Mobility/safety with mobility;Balance;Strengthening/ROM OT goals addressed during session: ADL's and self-care;Strengthening/ROM      AM-PAC OT "6 Clicks" Daily Activity     Outcome Measure   Help from another person eating meals?: Total Help from another person taking care of personal grooming?: Total Help from another person toileting, which includes using toliet, bedpan, or urinal?: Total Help from another person bathing (including washing, rinsing, drying)?: Total Help from another person to put on and taking off regular upper body clothing?: Total Help from another person to put on and taking off regular lower body clothing?: Total 6 Click Score: 6    End of Session Equipment Utilized During Treatment: Oxygen (28%)  OT Visit Diagnosis: Cognitive communication deficit (R41.841);Muscle weakness (generalized) (M62.81);Pain;Hemiplegia and hemiparesis Hemiplegia - Right/Left:  (both) Hemiplegia - dominant/non-dominant:  (both) Pain - Right/Left: Right Pain - part of body: Leg (contractures)   Activity Tolerance Patient tolerated treatment well   Patient Left in bed;with call bell/phone within reach;with bed alarm set           Time: 3419-6222 OT Time Calculation (min): 53 min  Charges: OT General Charges $OT Visit: 1 Visit OT Treatments $Self Care/Home Management : 8-22 mins $Therapeutic Activity: 8-22 mins  Ignacia Palma, OTR/L Acute Rehab Services Pager [phone] Office [phone]      Evette Georges 01/26/2020, 8:39 AM

## 2020-01-27 LAB — GLUCOSE, CAPILLARY
Glucose-Capillary: 106 mg/dL — ABNORMAL HIGH (ref 70–99)
Glucose-Capillary: 87 mg/dL (ref 70–99)
Glucose-Capillary: 95 mg/dL (ref 70–99)
Glucose-Capillary: 120 mg/dL — ABNORMAL HIGH (ref 70–99)
Glucose-Capillary: 149 mg/dL — ABNORMAL HIGH (ref 70–99)
Glucose-Capillary: 130 mg/dL — ABNORMAL HIGH (ref 70–99)

## 2020-01-27 NOTE — Progress Notes (Signed)
[facility] PROGRESS NOTE  [place] IPJ:[id number] DOB: [birth year] DOA: 11/01/2019 PCP: Default, Provider, MD    Status: Remains inpatient appropriate because:Altered mental status, Unsafe d/c plan and Inpatient level of care appropriate due to severity of illness. Patient newly diagnosed with anoxic brain injury  Dispo: The patient is from: Home              Anticipated d/c is to: SNF              Anticipated d/c date is: > 3 days              Patient currently is medically stable to d/c.  Barriers to discharge: No SNF bed offers.  Needs trach capable facility. TOC communicating with financial counseling team who have yet to hear back from DSS regarding patient's assigned caseworker.   Code Status: Full Family Communication: 01/27/20 daughter [name] [phone]  DVT prophylaxis: Subcutaneous heparin Vaccination status: Covid vaccination status unknown  Foley catheter: No   HPI:  54 year old male with HIV, chronic alcohol abuse who was presented to the emergency department after being found unresponsive outside a convinient store.  EMS found him pulseless in PEA, unknown downtime, successfully resuscitated and brought to the emergency department.  UDS positive for THC/benzodiazepines.  CT head unremarkable.  Failed extubation trials due to severe anoxic brain injury and had tracheostomy placed.  PEG placed on 10/15.  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  This has led to significant pain as well.  [name] was consulted and recommendations/input/orders highly appreciated.  She may also benefit from Botox injections to both hips to aid in treatment of spasticity.   Significant Hospital Events   9/29 Admit post PEA arrest. UDS positive for THC, benzo's. ETOH 177 10/05 EEG ongoing. Versed restarted overnight due to agitation. Nicardipine increased.  10/06 Developed tachypnea with WUA,  no follow commands off sedation  10/07 Vomiting, TF held / restarted with recurrent vomiting 10/16 tracheostomy collar for 9 hours 10/18->10/19 off vent all night  10/20> TC 11/15 > downsized to #4 Shiley flex CFS   Subjective:   Seen in bed appears to be in no distress, unable to answer questions or follow commands reliably due to anoxic brain injury.  Objective: Vitals:   01/27/20 0555 01/27/20 0748  BP: 125/74   Pulse: 91 81  Resp:  17  Temp: 99.5 F (37.5 C)   SpO2: 100% 92%    Intake/Output Summary (Last 24 hours) at 01/27/2020 0926 Last data filed at 01/26/2020 2300 Gross per 24 hour  Intake 498 ml  Output --  Net 498 ml   Filed Weights   01/21/20 0500 01/22/20 0412 01/23/20 0700  Weight: 74 kg 72 kg 74.1 kg    Exam:  Awake and pleasantly confused at baseline, does smile to oral commands, trach site appears stable, [place].AT,  Supple Neck,No JVD, No cervical lymphadenopathy appriciated.  Symmetrical Chest wall movement, Good air movement bilaterally, CTAB RRR,No Gallops, Rubs or new Murmurs, No Parasternal Heave +ve B.Sounds, Abd Soft, No tenderness, PEG tube in place, No Cyanosis, severe contractures in lower extremities and hips flexed right more than left   Assessment/Plan:  Acute problems:  Anoxic brain injury 2/2 PEA arrest/pain syndrome secondary to hypertonicity/spastic tetraplegia:  -Presumed 2/2 combination alcohol and BZD overdose with UDS positive for THC and BZDs -Do not expect he will recover fully - long-term SNF placement recommended -Continue PT/OT/SLP -Continue baclofen, gabapentin, scheduled oxycodone  and Zanaflex 4 mg QID -Appreciate assistance of [name] given to Botox to right hamstrings, rectus femoris-and to recontact next week -Continue bilateral upper extremity WHO resting splints -Continue PROM per nursing staff every 4 hours -Continue low-dose Seroquel for brain injury sequela -Continue scheduled Tylenol for  pain -Continue CREA muscle rub -Continue KREG rotational bed   Acute hypoxemic respiratory failure/new tracheostomy tube requirement/Corynebacterium tracheobronchitis -Hypoxemia (resolved)-trach tube placed 10/3 -Continue PMV training per SLP -#4 cuffless trach-capping trial at discretion of trach team -[place] team following.  Not a candidate for decannulation until patient can reliably follow commands, cough and clear airway. -Sputum culture consistent with abundant Corynebacterium-continue linezolid for a total of 7 days (LD due 12/23)- NP has discussed with ID [name] who agrees with this treatment regimen. -continue Mucomyst nebs with albuterol nebs -continue physiotherapy with Vibra vest along with supervised PMV trials -Follow-up chest x-ray on 12/24 with low lung volumes but no acute cardiopulmonary findings  HIV:  -CD4 count of 124 October 2021 (had been as high as 250 Dec 2020)-as of today up to 259 -Dc'd Tivicay and Descovy infavor of Biktarvy to for eventual dc to SNF; per my discussion with infectious disease physician [name] on 12/23 preferred regimen would be Descovy and Tivicay-she will contact the HIV pharmacist to attempt to obtain discount cards to make this a more affordable option.  Once this has been confirmed will transition back to Descovy and Tivicay -CD4 count on 12/23 up to 259 -ID recommends continuing prophylactic Bactrim an additional 3 months  Dysphagia 2/2 anoxic brain injury -Continue tube feeding per PEG -Repeat SLP evaluation on 11/23 with recommendation for n.p.o. status  Goals of care:  -Palliative care was involved during this hospitalization.   -Goals of care were discussed. remains full code.  - Palliative care recommended outpatient follow-up with palliative providers. -May need to revisit end-of-life goals of care since patient making poor progress and currently having significant pain related to hypertonicity from brain injury  Protein  calorie malnutrition nutrition Status: Nutrition Problem: Increased nutrient needs Etiology: acute illness Signs/Symptoms: estimated needs Interventions: Tube feeding bolus doses of Osmolite, Juven twice daily, Prosource TF 45 mL 3 times daily Estimated body mass index is 26.37 kg/m as calculated from the following:   Height as of this encounter: 5' 6"  (1.676 m).   Weight as of this encounter: 74.1 kg.   Multiple decubiti not POA Wound / Incision (Open or Dehisced) 11/17/19 Puncture Abdomen Left;Anterior;Upper G-tube insertion site  (Active)  Date First Assessed/Time First Assessed: 11/17/19 1646   Wound Type: Puncture  Location: Abdomen  Location Orientation: Left;Anterior;Upper  Wound Description (Comments): G-tube insertion site   Present on Admission: No    Assessments 11/17/2019  4:47 PM 01/26/2020  8:50 AM  Dressing Type Gauze (Comment);Tape dressing Gauze (Comment)  Dressing Changed New Changed  Dressing Status Clean;Dry;Intact Clean;Dry;Intact  Dressing Change Frequency PRN --  Site / Wound Assessment Clean;Dry;Pink --  Peri-wound Assessment Intact --  Closure None --  Drainage Amount None --  Treatment Cleansed --     No Linked orders to display      Other problems:  Hypertension/grade 1 diastolic dysfunction:  -Continue amlodipine  -Echocardiogram this admission with preserved LVEF with evidence of grade   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   -Continue Keppra; levetiracetam level 35.9 on 11/12 -no further seizure activity since transitioned out of ICU setting therefore will discontinue seizure precaution  Inguinal hernias -Evaluated by general surgery  this admission. Documented as chronically incarcerated. Patient has been clinically stable and tolerating tube feedings and having bowel movements so unless patient obstructed would not be considered a candidate for repair. Even if obstructed consulting surgeon was not sure he would be a  candidate for hernia repair regardless .   Data Reviewed: Basic Metabolic Panel: Recent Labs  Lab 01/24/20 0120  NA 145  K 4.0  CL 107  CO2 27  GLUCOSE 101*  BUN 21*  CREATININE 0.66  CALCIUM 10.0   Liver Function Tests: No results for input(s): AST, ALT, ALKPHOS, BILITOT, PROT, ALBUMIN in the last 168 hours. No results for input(s): LIPASE, AMYLASE in the last 168 hours. No results for input(s): AMMONIA in the last 168 hours. CBC: No results for input(s): WBC, NEUTROABS, HGB, HCT, MCV, PLT in the last 168 hours. Cardiac Enzymes: No results for input(s): CKTOTAL, CKMB, CKMBINDEX, TROPONINI in the last 168 hours. BNP (last 3 results) No results for input(s): BNP in the last 8760 hours.  ProBNP (last 3 results) No results for input(s): PROBNP in the last 8760 hours.  CBG: Recent Labs  Lab 01/26/20 1147 01/26/20 1530 01/26/20 1957 01/27/20 0409 01/27/20 0740  GLUCAP 105* 123* 126* 106* 87    No results found for this or any previous visit (from the past 240 hour(s)).   Studies: DG Chest Port 1 View  Result Date: 01/26/2020 CLINICAL DATA:  Shortness of breath. EXAM: PORTABLE CHEST 1 VIEW COMPARISON:  01/08/2020 FINDINGS: 0732 hours. Tracheostomy tube again noted. Low lung volumes. The lungs are clear without focal pneumonia, edema, pneumothorax or pleural effusion. Interstitial markings are diffusely coarsened with chronic features. Cardiopericardial silhouette is at upper limits of normal for size. IMPRESSION: Low volume film without acute cardiopulmonary findings. Electronically Signed   By: Misty Stanley M.D.   On: 01/26/2020 08:01    Scheduled Meds: . acetaminophen (TYLENOL) oral liquid 160 mg/5 mL  650 mg Per Tube Q6H  . amLODipine  10 mg Per Tube Daily  . baclofen  30 mg Per Tube TID  . chlorhexidine gluconate (MEDLINE KIT)  15 mL Mouth Rinse BID  . Darunavir-Cobicisctat-Emtricitabine-Tenofovir Alafenamide  1 tablet Oral Q breakfast  . famotidine  10.4 mg  Per Tube BID  . feeding supplement (OSMOLITE 1.5 CAL)  237 mL Per Tube Q24H  . feeding supplement (OSMOLITE 1.5 CAL)  474 mL Per Tube TID  . feeding supplement (PROSource TF)  45 mL Per Tube TID  . fiber  1 packet Per Tube BID  . folic acid  1 mg Per Tube Daily  . free water  200 mL Per Tube Q4H  . gabapentin  200 mg Per Tube Q8H  . heparin injection (subcutaneous)  5,000 Units Subcutaneous Q8H  . levETIRAcetam  1,500 mg Per Tube BID  . Muscle Rub   Topical QID  . oxyCODONE  5 mg Per Tube Q8H  . QUEtiapine  12.5 mg Per Tube QHS  . scopolamine  1 patch Transdermal Q72H  . sulfamethoxazole-trimethoprim  1 tablet Per Tube Once per day on Mon Wed Fri  . thiamine  100 mg Per Tube Daily  . tiZANidine  4 mg Per Tube QID   Continuous Infusions:   Active Problems:   Cardiac arrest [facility])   Community acquired pneumonia of left upper lobe of lung   Anoxic brain injury ([place])   Alcohol abuse   Acute respiratory failure with hypoxia ([facility])   Vomiting   Pressure injury of skin  Small bowel obstruction ([facility])   Palliative care encounter   Bowel obstruction ([facility])   Chronic respiratory failure ([facility])   Diastolic dysfunction   Tracheostomy dependent ([place])   Dysphagia   Protein-calorie malnutrition ([place])   Seizures ([place])   Bilateral recurrent inguinal hernia   Muscle spasticity   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[place])   Tracheobronchitis   Sequela of Corynebacterium infection   Consultants:  PCCM  Palliative medicine  General surgery  Neurology  Interventional radiology  Procedures:  EEG  Echocardiogram  Tracheostomy  Antibiotics: Anti-infectives (From admission, onward)   Start     Dose/Rate Route Frequency Ordered Stop   01/19/20 2200  linezolid (ZYVOX) tablet 600 mg        600 mg Per Tube Every 12 hours 01/19/20 1534 01/25/20 2019   01/19/20 1100  levofloxacin (LEVAQUIN) tablet 500 mg  Status:  Discontinued        500 mg Per Tube Daily 01/19/20 1004  01/19/20 1534   12/19/19 0930  Darunavir-Cobicisctat-Emtricitabine-Tenofovir Alafenamide (SYMTUZA) 800-150-200-10 MG TABS 1 tablet        1 tablet Oral Daily with breakfast 12/19/19 0840     12/13/19 1000  bictegravir-emtricitabine-tenofovir AF (BIKTARVY) 50-200-25 MG per tablet 1 tablet  Status:  Discontinued        1 tablet Oral Daily 12/12/19 1413 12/19/19 0840   11/17/19 1548  ceFAZolin (ANCEF) 2-4 GM/100ML-% IVPB       Note to Pharmacy: Domenick Bookbinder   : cabinet override      11/17/19 1548 11/18/19 0359   11/16/19 1515  ceFAZolin (ANCEF) IVPB 2g/100 mL premix        2 g 200 mL/hr over 30 Minutes Intravenous To Radiology 11/16/19 1511 11/17/19 1625   11/15/19 0900  sulfamethoxazole-trimethoprim (BACTRIM DS) 800-160 MG per tablet 1 tablet        1 tablet Per Tube Once per day on Mon Wed Fri 11/13/19 1906     11/13/19 1615  dolutegravir (TIVICAY) tablet 50 mg  Status:  Discontinued        50 mg Oral Daily 11/13/19 1521 12/12/19 1413   11/13/19 1615  emtricitabine-tenofovir AF (DESCOVY) 200-25 MG per tablet 1 tablet  Status:  Discontinued        1 tablet Per Tube Daily 11/13/19 1521 12/12/19 1413   11/13/19 1530  sulfamethoxazole-trimethoprim (BACTRIM DS) 800-160 MG per tablet 1 tablet  Status:  Discontinued        1 tablet Oral Once per day on Mon Wed Fri 11/13/19 1441 11/13/19 1906   11/13/19 1500  bictegravir-emtricitabine-tenofovir AF (BIKTARVY) 50-200-25 MG per tablet 1 tablet  Status:  Discontinued        1 tablet Oral Daily 11/13/19 1403 11/13/19 1521   11/10/19 1000  erythromycin 250 mg in sodium chloride 0.9 % 100 mL IVPB        250 mg 100 mL/hr over 60 Minutes Intravenous Every 8 hours 11/10/19 0907 11/11/19 2000   11/07/19 1200  ceFEPIme (MAXIPIME) 2 g in sodium chloride 0.9 % 100 mL IVPB        2 g 200 mL/hr over 30 Minutes Intravenous Every 8 hours 11/07/19 1013 11/13/19 2127   11/03/19 1200  cefTRIAXone (ROCEPHIN) 2 g in sodium chloride 0.9 % 100 mL IVPB        2 g 200  mL/hr over 30 Minutes Intravenous Every 24 hours 11/03/19 0922 11/05/19 1427   11/02/19 0800  vancomycin (VANCOREADY) IVPB 750 mg/150 mL  Status:  Discontinued        750 mg 150 mL/hr over 60 Minutes Intravenous Every 12 hours 11/01/19 1935 11/02/19 1105   11/02/19 0600  piperacillin-tazobactam (ZOSYN) IVPB 3.375 g  Status:  Discontinued        3.375 g 12.5 mL/hr over 240 Minutes Intravenous Every 8 hours 11/02/19 0311 11/03/19 0920   11/01/19 2200  ceFEPIme (MAXIPIME) 2 g in sodium chloride 0.9 % 100 mL IVPB  Status:  Discontinued        2 g 200 mL/hr over 30 Minutes Intravenous Every 12 hours 11/01/19 2143 11/02/19 0311   11/01/19 1915  vancomycin (VANCOREADY) IVPB 1500 mg/300 mL        1,500 mg 150 mL/hr over 120 Minutes Intravenous  Once 11/01/19 1909 11/01/19 2147   11/01/19 1845  cefTRIAXone (ROCEPHIN) 1 g in sodium chloride 0.9 % 100 mL IVPB        1 g 200 mL/hr over 30 Minutes Intravenous  Once 11/01/19 1842 11/01/19 2051   11/01/19 1845  azithromycin (ZITHROMAX) 500 mg in sodium chloride 0.9 % 250 mL IVPB        500 mg 250 mL/hr over 60 Minutes Intravenous  Once 11/01/19 1842 11/01/19 2051       Time spent: 20 minutes  01/27/2020, 9:26 AM  LOS: 87 days    Signature  Lala Lund M.D on 01/27/2020 at 9:26 AM   -  To page go to [email or web address]

## 2020-01-28 LAB — CBC WITH DIFFERENTIAL/PLATELET
Abs Immature Granulocytes: 0.02 10*3/uL (ref 0.00–0.07)
Basophils Absolute: 0 10*3/uL (ref 0.0–0.1)
Basophils Relative: 0 %
Eosinophils Absolute: 0.2 10*3/uL (ref 0.0–0.5)
Eosinophils Relative: 3 %
HCT: 44.8 % (ref 39.0–52.0)
Hemoglobin: 14.2 g/dL (ref 13.0–17.0)
Immature Granulocytes: 0 %
Lymphocytes Relative: 36 %
Lymphs Abs: 1.8 10*3/uL (ref 0.7–4.0)
MCH: 30.4 pg (ref 26.0–34.0)
MCHC: 31.7 g/dL (ref 30.0–36.0)
MCV: 95.9 fL (ref 80.0–100.0)
Monocytes Absolute: 0.6 10*3/uL (ref 0.1–1.0)
Monocytes Relative: 12 %
Neutro Abs: 2.3 10*3/uL (ref 1.7–7.7)
Neutrophils Relative %: 49 %
Platelets: 193 10*3/uL (ref 150–400)
RBC: 4.67 MIL/uL (ref 4.22–5.81)
RDW: 12.9 % (ref 11.5–15.5)
WBC: 4.9 10*3/uL (ref 4.0–10.5)
nRBC: 0 % (ref 0.0–0.2)

## 2020-01-28 LAB — GLUCOSE, CAPILLARY
Glucose-Capillary: 110 mg/dL — ABNORMAL HIGH (ref 70–99)
Glucose-Capillary: 99 mg/dL (ref 70–99)
Glucose-Capillary: 124 mg/dL — ABNORMAL HIGH (ref 70–99)
Glucose-Capillary: 89 mg/dL (ref 70–99)
Glucose-Capillary: 125 mg/dL — ABNORMAL HIGH (ref 70–99)
Glucose-Capillary: 173 mg/dL — ABNORMAL HIGH (ref 70–99)

## 2020-01-28 LAB — COMPREHENSIVE METABOLIC PANEL
ALT: 27 U/L (ref 0–44)
AST: 32 U/L (ref 15–41)
Albumin: 3.5 g/dL (ref 3.5–5.0)
Alkaline Phosphatase: 106 U/L (ref 38–126)
Anion gap: 11 (ref 5–15)
BUN: 21 mg/dL — ABNORMAL HIGH (ref 6–20)
CO2: 26 mmol/L (ref 22–32)
Calcium: 10.3 mg/dL (ref 8.9–10.3)
Chloride: 105 mmol/L (ref 98–111)
Creatinine, Ser: 0.81 mg/dL (ref 0.61–1.24)
GFR, Estimated: >60 mL/min (ref >60)
Glucose, Bld: 112 mg/dL — ABNORMAL HIGH (ref 70–99)
Potassium: 4.7 mmol/L (ref 3.5–5.1)
Sodium: 142 mmol/L (ref 135–145)
Total Bilirubin: 0.6 mg/dL (ref 0.3–1.2)
Total Protein: 7.8 g/dL (ref 6.5–8.1)

## 2020-01-28 LAB — MAGNESIUM: Magnesium: 1.9 mg/dL (ref 1.7–2.4)

## 2020-01-28 MED ORDER — LORAZEPAM 2 MG/ML IJ SOLN
0.5000 mg | Freq: Two times a day (BID) | INTRAMUSCULAR | Status: AC | PRN
  Administered 2020-01-30: 0.5 mg via INTRAVENOUS
  Filled 2020-01-28: qty 1

## 2020-01-28 NOTE — Progress Notes (Signed)
[facility] PROGRESS NOTE  Ryan Orozco AOZ:[id number] DOB: 17-Jul-1965 DOA: 11/01/2019 PCP: Default, Provider, MD    Status: Remains inpatient appropriate because:Altered mental status, Unsafe d/c plan and Inpatient level of care appropriate due to severity of illness. Patient newly diagnosed with anoxic brain injury  Dispo: The patient is from: Home              Anticipated d/c is to: SNF              Anticipated d/c date is: > 3 days              Patient currently is medically stable to d/c.  Barriers to discharge: No SNF bed offers.  Needs trach capable facility. TOC communicating with financial counseling team who have yet to hear back from DSS regarding patient's assigned caseworker.   Code Status: Full Family Communication: 01/27/20 daughter [name] [phone]  DVT prophylaxis: Subcutaneous heparin Vaccination status: Covid vaccination status unknown  Foley catheter: No   HPI:  54 year old male with HIV, chronic alcohol abuse who was presented to the emergency department after being found unresponsive outside a convinient store.  EMS found him pulseless in PEA, unknown downtime, successfully resuscitated and brought to the emergency department.  UDS positive for THC/benzodiazepines.  CT head unremarkable.  Failed extubation trials due to severe anoxic brain injury and had tracheostomy placed.  PEG placed on 10/15.  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  This has led to significant pain as well.  [name] was consulted and recommendations/input/orders highly appreciated.  She may also benefit from Botox injections to both hips to aid in treatment of spasticity.   Significant Hospital Events   9/29 Admit post PEA arrest. UDS positive for THC, benzo's. ETOH 177 10/05 EEG ongoing. Versed restarted overnight due to agitation. Nicardipine increased.  10/06 Developed tachypnea with WUA,  no follow commands off sedation  10/07 Vomiting, TF held / restarted with recurrent vomiting 10/16 tracheostomy collar for 9 hours 10/18->10/19 off vent all night  10/20> TC 11/15 > downsized to #4 Shiley flex CFS   Subjective:   Patient seen he remains in bed, appears to be in no distress, unable to answer questions or follow commands.  Objective: Vitals:   01/28/20 0514 01/28/20 0829  BP: 117/86   Pulse: 88 (!) 102  Resp: 19 17  Temp: 99 F (37.2 C)   SpO2: 96% 98%    Intake/Output Summary (Last 24 hours) at 01/28/2020 0900 Last data filed at 01/28/2020 0600 Gross per 24 hour  Intake 1400 ml  Output 400 ml  Net 1000 ml   Filed Weights   01/22/20 0412 01/23/20 0700 01/28/20 0500  Weight: 72 kg 74.1 kg 74.5 kg    Exam:  Awake but unable to follow commands or answer questions reliably, trach site stable,  severe contractures noted legs more than arms, right side more than left, PEG tube in place [place].AT  Supple Neck,No JVD, No cervical lymphadenopathy appriciated.  Symmetrical Chest wall movement, Good air movement bilaterally, CTAB RRR,No Gallops, Rubs or new Murmurs, No Parasternal Heave +ve B.Sounds, Abd Soft, No tenderness, No organomegaly appriciated, No rebound - guarding or rigidity. No edema    Assessment/Plan:  Acute problems:  Anoxic brain injury 2/2 PEA arrest/pain syndrome secondary to hypertonicity/spastic tetraplegia:  -Presumed 2/2 combination alcohol and BZD overdose with UDS positive for THC and BZDs -Do not expect he will recover fully - long-term SNF placement  recommended -Continue PT/OT/SLP -Continue baclofen, gabapentin, scheduled oxycodone and Zanaflex 4 mg QID -Appreciate assistance of [name] given to Botox to right hamstrings, rectus femoris-and to recontact next week -Continue bilateral upper extremity WHO resting splints -Continue PROM per nursing staff every 4 hours -Continue low-dose Seroquel for brain injury  sequela -Continue scheduled Tylenol for pain -Continue CREA muscle rub -Continue KREG rotational bed   Acute hypoxemic respiratory failure/new tracheostomy tube requirement/Corynebacterium tracheobronchitis -Hypoxemia (resolved)-trach tube placed 10/3 -Continue PMV training per SLP -#4 cuffless trach-capping trial at discretion of trach team -[place] team following.  Not a candidate for decannulation until patient can reliably follow commands, cough and clear airway. -Sputum culture consistent with abundant Corynebacterium-continue linezolid for a total of 7 days (LD due 12/23)- NP has discussed with ID [name] who agrees with this treatment regimen. -continue Mucomyst nebs with albuterol nebs -continue physiotherapy with Vibra vest along with supervised PMV trials -Follow-up chest x-ray on 12/24 with low lung volumes but no acute cardiopulmonary findings  HIV:  -CD4 count of 124 October 2021 (had been as high as 250 Dec 2020)-as of today up to 259 -Dc'd Tivicay and Descovy infavor of Biktarvy to for eventual dc to SNF; per my discussion with infectious disease physician [name] on 12/23 preferred regimen would be Descovy and Tivicay-she will contact the HIV pharmacist to attempt to obtain discount cards to make this a more affordable option.  Once this has been confirmed will transition back to Descovy and Tivicay -CD4 count on 12/23 up to 259 -ID recommends continuing prophylactic Bactrim an additional 3 months  Dysphagia 2/2 anoxic brain injury -Continue tube feeding per PEG -Repeat SLP evaluation on 11/23 with recommendation for n.p.o. status  Goals of care:  -Palliative care was involved during this hospitalization.   -Goals of care were discussed. remains full code.  - Palliative care recommended outpatient follow-up with palliative providers. -May need to revisit end-of-life goals of care since patient making poor progress and currently having significant pain related to  hypertonicity from brain injury  Protein calorie malnutrition nutrition Status: Nutrition Problem: Increased nutrient needs Etiology: acute illness Signs/Symptoms: estimated needs Interventions: Tube feeding bolus doses of Osmolite, Juven twice daily, Prosource TF 45 mL 3 times daily Estimated body mass index is 26.5 kg/m as calculated from the following:   Height as of this encounter: 5' 6"  (1.676 m).   Weight as of this encounter: 74.5 kg.   Multiple decubiti not POA Wound / Incision (Open or Dehisced) 11/17/19 Puncture Abdomen Left;Anterior;Upper G-tube insertion site  (Active)  Date First Assessed/Time First Assessed: 11/17/19 1646   Wound Type: Puncture  Location: Abdomen  Location Orientation: Left;Anterior;Upper  Wound Description (Comments): G-tube insertion site   Present on Admission: No    Assessments 11/17/2019  4:47 PM 01/27/2020  8:00 PM  Dressing Type Gauze (Comment);Tape dressing Gauze (Comment)  Dressing Changed New --  Dressing Status Clean;Dry;Intact Clean;Dry;Intact  Dressing Change Frequency PRN PRN  Site / Wound Assessment Clean;Dry;Pink Clean;Dry  Peri-wound Assessment Intact --  Closure None None  Drainage Amount None --  Treatment Cleansed --     No Linked orders to display      Other problems:  Hypertension/grade 1 diastolic dysfunction:  -Continue amlodipine  -Echocardiogram this admission with preserved LVEF with evidence of grade   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   -Continue Keppra; levetiracetam level 35.9 on 11/12 -no further seizure activity since transitioned out of ICU setting therefore will discontinue seizure  precaution  Inguinal hernias -Evaluated by general surgery this admission. Documented as chronically incarcerated. Patient has been clinically stable and tolerating tube feedings and having bowel movements so unless patient obstructed would not be considered a candidate for repair. Even if obstructed  consulting surgeon was not sure he would be a candidate for hernia repair regardless .   Data Reviewed: Basic Metabolic Panel: Recent Labs  Lab 01/24/20 0120 01/28/20 0200  NA 145 142  K 4.0 4.7  CL 107 105  CO2 27 26  GLUCOSE 101* 112*  BUN 21* 21*  CREATININE 0.66 0.81  CALCIUM 10.0 10.3  MG  --  1.9   Liver Function Tests: Recent Labs  Lab 01/28/20 0200  AST 32  ALT 27  ALKPHOS 106  BILITOT 0.6  PROT 7.8  ALBUMIN 3.5   No results for input(s): LIPASE, AMYLASE in the last 168 hours. No results for input(s): AMMONIA in the last 168 hours. CBC: Recent Labs  Lab 01/28/20 0200  WBC 4.9  NEUTROABS 2.3  HGB 14.2  HCT 44.8  MCV 95.9  PLT 193   Cardiac Enzymes: No results for input(s): CKTOTAL, CKMB, CKMBINDEX, TROPONINI in the last 168 hours. BNP (last 3 results) No results for input(s): BNP in the last 8760 hours.  ProBNP (last 3 results) No results for input(s): PROBNP in the last 8760 hours.  CBG: Recent Labs  Lab 01/27/20 1630 01/27/20 1939 01/27/20 2325 01/28/20 0316 01/28/20 0730  GLUCAP 120* 149* 130* 110* 99    No results found for this or any previous visit (from the past 240 hour(s)).   Studies: No results found.  Scheduled Meds: . acetaminophen (TYLENOL) oral liquid 160 mg/5 mL  650 mg Per Tube Q6H  . amLODipine  10 mg Per Tube Daily  . baclofen  30 mg Per Tube TID  . chlorhexidine gluconate (MEDLINE KIT)  15 mL Mouth Rinse BID  . Darunavir-Cobicisctat-Emtricitabine-Tenofovir Alafenamide  1 tablet Oral Q breakfast  . famotidine  10.4 mg Per Tube BID  . feeding supplement (OSMOLITE 1.5 CAL)  237 mL Per Tube Q24H  . feeding supplement (OSMOLITE 1.5 CAL)  474 mL Per Tube TID  . feeding supplement (PROSource TF)  45 mL Per Tube TID  . fiber  1 packet Per Tube BID  . folic acid  1 mg Per Tube Daily  . free water  200 mL Per Tube Q4H  . gabapentin  200 mg Per Tube Q8H  . heparin injection (subcutaneous)  5,000 Units Subcutaneous Q8H   . levETIRAcetam  1,500 mg Per Tube BID  . Muscle Rub   Topical QID  . oxyCODONE  5 mg Per Tube Q8H  . QUEtiapine  12.5 mg Per Tube QHS  . scopolamine  1 patch Transdermal Q72H  . sulfamethoxazole-trimethoprim  1 tablet Per Tube Once per day on Mon Wed Fri  . thiamine  100 mg Per Tube Daily  . tiZANidine  4 mg Per Tube QID   Continuous Infusions:   Active Problems:   Cardiac arrest [facility])   Community acquired pneumonia of left upper lobe of lung   Anoxic brain injury ([place])   Alcohol abuse   Acute respiratory failure with hypoxia ([facility])   Vomiting   Pressure injury of skin   Small bowel obstruction ([facility])   Palliative care encounter   Bowel obstruction ([facility])   Chronic respiratory failure ([facility])   Diastolic dysfunction   Tracheostomy dependent ([place])   Dysphagia   Protein-calorie malnutrition ([place])  Seizures ([place])   Bilateral recurrent inguinal hernia   Muscle spasticity   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[place])   Tracheobronchitis   Sequela of Corynebacterium infection   Consultants:  PCCM  Palliative medicine  General surgery  Neurology  Interventional radiology  ID Dr Graylon Good - phone  Procedures:  EEG  Echocardiogram  Tracheostomy  Antibiotics:  Antibiotics Given (last 72 hours)    Date/Time Action Medication Dose   01/25/20 2019 Given   linezolid (ZYVOX) tablet 600 mg 600 mg   01/26/20 0515 Given   Darunavir-Cobicisctat-Emtricitabine-Tenofovir Alafenamide (SYMTUZA) 800-150-200-10 MG TABS 1 tablet 1 tablet   01/26/20 0843 Given   sulfamethoxazole-trimethoprim (BACTRIM DS) 800-160 MG per tablet 1 tablet 1 tablet   01/27/20 0845 Given   Darunavir-Cobicisctat-Emtricitabine-Tenofovir Alafenamide (SYMTUZA) 800-150-200-10 MG TABS 1 tablet 1 tablet      Time spent: 20 minutes  01/28/2020, 9:00 AM  LOS: 88 days    Signature  Lala Lund M.D on 01/28/2020 at 9:00 AM   -  To page go to [email or web address]

## 2020-01-28 NOTE — Plan of Care (Signed)
  Problem: Education: Goal: Knowledge of General Education information will improve Description: Including pain rating scale, medication(s)/side effects and non-pharmacologic comfort measures Outcome: Not Progressing   Problem: Health Behavior/Discharge Planning: Goal: Ability to manage health-related needs will improve Outcome: Not Progressing   Problem: Clinical Measurements: Goal: Ability to maintain clinical measurements within normal limits will improve Outcome: Not Progressing Goal: Will remain free from infection Outcome: Not Progressing Goal: Diagnostic test results will improve Outcome: Not Progressing Goal: Respiratory complications will improve Outcome: Not Progressing Goal: Cardiovascular complication will be avoided Outcome: Not Progressing   Problem: Activity: Goal: Risk for activity intolerance will decrease Outcome: Not Progressing   Problem: Nutrition: Goal: Adequate nutrition will be maintained Outcome: Not Progressing   Problem: Coping: Goal: Level of anxiety will decrease Outcome: Not Progressing   Problem: Elimination: Goal: Will not experience complications related to bowel motility Outcome: Not Progressing Goal: Will not experience complications related to urinary retention Outcome: Not Progressing   Problem: Pain Managment: Goal: General experience of comfort will improve Outcome: Not Progressing   Problem: Safety: Goal: Ability to remain free from injury will improve Outcome: Not Progressing   Problem: Skin Integrity: Goal: Risk for impaired skin integrity will decrease Outcome: Not Progressing   Problem: Education: Goal: 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 Outcome: Not Progressing   Problem: Activity: Goal: Ability to tolerate increased activity will improve Outcome: Not Progressing   Problem: Health Behavior/Discharge Planning: Goal: 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 Outcome: Not Progressing    Problem: Respiratory: Goal: Patent airway maintenance will improve Outcome: Not Progressing   Problem: Role Relationship: Goal: Ability to communicate will improve Outcome: Not Progressing   Problem: Education: Goal: Knowledge of the prescribed therapeutic regimen Outcome: Not Progressing Goal: Knowledge of disease or condition will improve Outcome: Not Progressing   Problem: Clinical Measurements: Goal: Neurologic status will improve Outcome: Not Progressing   Problem: Tissue Perfusion: Goal: Ability to maintain intracranial pressure will improve Outcome: Not Progressing   Problem: Respiratory: Goal: Will regain and/or maintain adequate ventilation Outcome: Not Progressing   Problem: Skin Integrity: Goal: Risk for impaired skin integrity will decrease Outcome: Not Progressing Goal: Demonstration of wound healing without infection will improve Outcome: Not Progressing   Problem: Psychosocial: Goal: Ability to verbalize positive feelings about self will improve Outcome: Not Progressing Goal: Ability to participate in self-care as condition permits will improve Outcome: Not Progressing Goal: Ability to identify appropriate support needs will improve Outcome: Not Progressing   Problem: Health Behavior/Discharge Planning: Goal: Ability to manage health-related needs will improve Outcome: Not Progressing   Problem: Nutritional: Goal: Risk of aspiration will decrease Outcome: Not Progressing Goal: Dietary intake will improve Outcome: Not Progressing   Problem: Communication: Goal: Ability to communicate needs accurately will improve Outcome: Not Progressing   

## 2020-01-29 LAB — GLUCOSE, CAPILLARY
Glucose-Capillary: 123 mg/dL — ABNORMAL HIGH (ref 70–99)
Glucose-Capillary: 104 mg/dL — ABNORMAL HIGH (ref 70–99)
Glucose-Capillary: 148 mg/dL — ABNORMAL HIGH (ref 70–99)
Glucose-Capillary: 100 mg/dL — ABNORMAL HIGH (ref 70–99)
Glucose-Capillary: 86 mg/dL (ref 70–99)
Glucose-Capillary: 113 mg/dL — ABNORMAL HIGH (ref 70–99)

## 2020-01-29 NOTE — Progress Notes (Signed)
[facility] PROGRESS NOTE  Ryan Orozco NLG:[id number] DOB: August 23, 1965 DOA: 11/01/2019 PCP: Default, Provider, MD    11/16   Status: Remains inpatient appropriate because:Altered mental status, Unsafe d/c plan and Inpatient level of care appropriate due to severity of illness. Patient newly diagnosed with anoxic brain injury  Dispo: The patient is from: Home              Anticipated d/c is to: SNF              Anticipated d/c date is: > 3 days              Patient currently is medically stable to d/c.  Barriers to discharge: No SNF bed offers.  Needs trach capable facility. TOC communicating with financial counseling team who have yet to hear back from DSS regarding patient's assigned caseworker.   Code Status: Full Family Communication: 12/25 dtr Ryan Orozco updated by attending by telephone DVT prophylaxis: Subcutaneous heparin Vaccination status: Covid vaccination status unknown  Foley catheter: No  HPI: 54 year old male with HIV, chronic alcohol abuse who was presented to the emergency department after being found unresponsive outside a convinient store.  EMS found him pulseless in PEA, unknown downtime, successfully resuscitated and brought to the emergency department.  UDS positive for THC/benzodiazepines.  CT head unremarkable.  Failed extubation trials due to severe anoxic brain injury and had tracheostomy placed.  PEG placed on 10/15.  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  This has led to significant pain as well.  [name] was consulted and recommendations/input/orders highly appreciated.  She may also benefit from Botox injections to both hips to aid in treatment of spasticity.   Significant Hospital Events   9/29 Admit post PEA arrest. UDS positive for THC, benzo's. ETOH 177 10/05 EEG ongoing. Versed restarted overnight due to agitation. Nicardipine increased.  10/06 Developed  tachypnea with WUA, no follow commands off sedation  10/07 Vomiting, TF held / restarted with recurrent vomiting 10/16 tracheostomy collar for 9 hours 10/18->10/19 off vent all night  10/20> TC 11/15 > downsized to #4 Shiley flex CFS  Subjective: Alert.  Had tech assistant pulling patient up in bed and as we were raising the head of the bed patient repeatedly said " high" and began smiling broadly when head of bed stopped at 90 degrees  Objective: Vitals:   01/29/20 0500 01/29/20 0735  BP: 136/77   Pulse: 100 94  Resp: 18 18  Temp: 97.8 F (36.6 C)   SpO2: 98% 96%    Intake/Output Summary (Last 24 hours) at 01/29/2020 1051 Last data filed at 01/29/2020 0500 Gross per 24 hour  Intake --  Output 1600 ml  Net -1600 ml   Filed Weights   01/22/20 0412 01/23/20 0700 01/28/20 0500  Weight: 72 kg 74.1 kg 74.5 kg    Exam: General: Awake, no acute distress, interactive Pulmonary: Bilateral lung sounds coarse to auscultation and diminished in the bases, percent, O2 sat 96%, no increased work of breathing at rest Cardiac: Pulse is regular, heart sounds S1-S2 without murmurs or rubs, peripheral pulses palpable, extremities are warm, no peripheral edema Abdomen: Abdomen is soft and nondistended.  PEG tube in place with tube feedings infusing.  PEG tube site unremarkable Neurological: Alert.  Unable to appropriately assess orientation given patient's limited verbal response.  Spontaneous and attempted purposeful movement of upper extremities which is limited by hypertonicity and spasticity although hypertonicity has greatly improved with  appropriate pharmacologic therapy.  Marked improvement and left lower extremity spasticity but continues to have significant spasticity involving the right hip flexors and unable to extend right lower extremity.  Assessment/Plan: Acute problems: Anoxic brain injury 2/2 PEA arrest/pain syndrome secondary to hypertonicity/spastic tetraplegia:  -Presumed 2/2  combination alcohol and BZD overdose with UDS positive for THC and BZDs -Do not expect he will recover fully - long-term SNF placement recommended -Continue PT/OT/SLP -Continue baclofen, gabapentin, scheduled oxycodone and Zanaflex 4 mg QID -Appreciate assistance of Dr. Swartz-12/27 have recontacted [name] regarding consideration-evaluation for Botox to right hamstrings, rectus femoris -Continue bilateral upper extremity WHO resting splints along with PROM per nursing staff every 4 hours -Continue low-dose Seroquel for brain injury sequela -Continue scheduled Tylenol for pain Crea muscle rub -Continue KREG rotational bed   Acute hypoxemic respiratory failure/new tracheostomy tube requirement/Corynebacterium tracheobronchitis -Hypoxemia (resolved)-trach tube placed 10/3 -Continue PMV training per SLP -#4 cuffless trach-capping trial at discretion of trach team -Trach team signed off as of 12/27 given not a candidate for decannulation until patient can reliably follow commands, cough and clear airway. -Sputum culture consistent with abundant Corynebacterium-completed linezolid for a total of 7 days (LD due 12/23)-discussed with ID [name] who agrees with this treatment regimen -continue Mucomyst nebs with albuterol nebs -continue physiotherapy with Vibra vest along with supervised PMV trials -Follow-up chest x-ray on 12/24 with low lung volumes but no acute cardiopulmonary findings  HIV:  -CD4 count of 124 October 2021 (had been as high as 250 Dec 2020)-as of today up to 259 -Dc'd Tivicay and Descovy infavor of Biktarvy to for eventual dc to SNF; per my discussion with infectious disease physician [name] on 12/23 preferred regimen would be Descovy and Tivicay-she will contact the HIV pharmacist to attempt to obtain discount cards to make this a more affordable option.  Once this has been confirmed will transition back to Descovy and Tivicay -CD4 count on 12/23 up to 259 -ID recommends  continuing prophylactic Bactrim an additional 3 months  Dysphagia 2/2 anoxic brain injury -Continue tube feeding per PEG -Repeat SLP evaluation on 11/23 with recommendation for n.p.o. status  Goals of care:  -Palliative care was involved during this hospitalization.   -Goals of care were discussed. remains full code.  - Palliative care recommended outpatient follow-up with palliative providers. -May need to revisit end-of-life goals of care since patient making poor progress and currently having significant pain related to hypertonicity from brain injury  Protein calorie malnutrition nutrition Status: Nutrition Problem: Increased nutrient needs Etiology: acute illness Signs/Symptoms: estimated needs Interventions: Tube feeding bolus doses of Osmolite, Juven twice daily, Prosource TF 45 mL 3 times daily Estimated body mass index is 26.5 kg/m as calculated from the following:   Height as of this encounter: 5' 6"  (1.676 m).   Weight as of this encounter: 74.5 kg.   Multiple decubiti not POA Wound / Incision (Open or Dehisced) 11/17/19 Puncture Abdomen Left;Anterior;Upper G-tube insertion site  (Active)  Date First Assessed/Time First Assessed: 11/17/19 1646   Wound Type: Puncture  Location: Abdomen  Location Orientation: Left;Anterior;Upper  Wound Description (Comments): G-tube insertion site   Present on Admission: No    Assessments 11/17/2019  4:47 PM 01/27/2020  8:00 PM  Dressing Type Gauze (Comment);Tape dressing Gauze (Comment)  Dressing Changed New --  Dressing Status Clean;Dry;Intact Clean;Dry;Intact  Dressing Change Frequency PRN PRN  Site / Wound Assessment Clean;Dry;Pink Clean;Dry  Peri-wound Assessment Intact --  Closure None None  Drainage Amount None --  Treatment Cleansed --     No Linked orders to display      Other problems: Hypertension/grade 1 diastolic dysfunction:  -Continue amlodipine  -Echocardiogram this admission with preserved LVEF with evidence of  grade   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   -Continue Keppra; levetiracetam level 35.9 on 11/12 -no further seizure activity since transitioned out of ICU setting therefore will discontinue seizure precaution  Inguinal hernias -Evaluated by general surgery this admission. Documented as chronically incarcerated. Patient has been clinically stable and tolerating tube feedings and having bowel movements so unless patient obstructed would not be considered a candidate for repair. Even if obstructed consulting surgeon was not sure he would be a candidate for hernia repair regardless .   Data Reviewed: Basic Metabolic Panel: Recent Labs  Lab 01/24/20 0120 01/28/20 0200  NA 145 142  K 4.0 4.7  CL 107 105  CO2 27 26  GLUCOSE 101* 112*  BUN 21* 21*  CREATININE 0.66 0.81  CALCIUM 10.0 10.3  MG  --  1.9   Liver Function Tests: Recent Labs  Lab 01/28/20 0200  AST 32  ALT 27  ALKPHOS 106  BILITOT 0.6  PROT 7.8  ALBUMIN 3.5   No results for input(s): LIPASE, AMYLASE in the last 168 hours. No results for input(s): AMMONIA in the last 168 hours. CBC: Recent Labs  Lab 01/28/20 0200  WBC 4.9  NEUTROABS 2.3  HGB 14.2  HCT 44.8  MCV 95.9  PLT 193   Cardiac Enzymes: No results for input(s): CKTOTAL, CKMB, CKMBINDEX, TROPONINI in the last 168 hours. BNP (last 3 results) No results for input(s): BNP in the last 8760 hours.  ProBNP (last 3 results) No results for input(s): PROBNP in the last 8760 hours.  CBG: Recent Labs  Lab 01/28/20 1601 01/28/20 1914 01/28/20 2327 01/29/20 0312 01/29/20 0725  GLUCAP 89 125* 173* 123* 104*    No results found for this or any previous visit (from the past 240 hour(s)).   Studies: No results found.  Scheduled Meds: . acetaminophen (TYLENOL) oral liquid 160 mg/5 mL  650 mg Per Tube Q6H  . amLODipine  10 mg Per Tube Daily  . baclofen  30 mg Per Tube TID  . chlorhexidine gluconate (MEDLINE KIT)  15 mL Mouth  Rinse BID  . Darunavir-Cobicisctat-Emtricitabine-Tenofovir Alafenamide  1 tablet Oral Q breakfast  . famotidine  10.4 mg Per Tube BID  . feeding supplement (OSMOLITE 1.5 CAL)  237 mL Per Tube Q24H  . feeding supplement (OSMOLITE 1.5 CAL)  474 mL Per Tube TID  . feeding supplement (PROSource TF)  45 mL Per Tube TID  . fiber  1 packet Per Tube BID  . folic acid  1 mg Per Tube Daily  . free water  200 mL Per Tube Q4H  . gabapentin  200 mg Per Tube Q8H  . heparin injection (subcutaneous)  5,000 Units Subcutaneous Q8H  . levETIRAcetam  1,500 mg Per Tube BID  . Muscle Rub   Topical QID  . oxyCODONE  5 mg Per Tube Q8H  . QUEtiapine  12.5 mg Per Tube QHS  . scopolamine  1 patch Transdermal Q72H  . sulfamethoxazole-trimethoprim  1 tablet Per Tube Once per day on Mon Wed Fri  . thiamine  100 mg Per Tube Daily  . tiZANidine  4 mg Per Tube QID   Continuous Infusions:   Active Problems:   Cardiac arrest [facility])   Community acquired pneumonia of left upper  lobe of lung   Anoxic brain injury ([place])   Alcohol abuse   Acute respiratory failure with hypoxia ([facility])   Vomiting   Pressure injury of skin   Small bowel obstruction ([facility])   Palliative care encounter   Bowel obstruction ([facility])   Chronic respiratory failure ([facility])   Diastolic dysfunction   Tracheostomy dependent ([place])   Dysphagia   Protein-calorie malnutrition ([place])   Seizures ([place])   Bilateral recurrent inguinal hernia   Muscle spasticity   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[place])   Tracheobronchitis   Sequela of Corynebacterium infection   Consultants:  PCCM  Palliative medicine  General surgery  Neurology  Interventional radiology  Procedures:  EEG  Echocardiogram  Tracheostomy  Antibiotics: Anti-infectives (From admission, onward)   Start     Dose/Rate Route Frequency Ordered Stop   01/19/20 2200  linezolid (ZYVOX) tablet 600 mg        600 mg Per Tube Every 12 hours 01/19/20 1534 01/25/20 2019    01/19/20 1100  levofloxacin (LEVAQUIN) tablet 500 mg  Status:  Discontinued        500 mg Per Tube Daily 01/19/20 1004 01/19/20 1534   12/19/19 0930  Darunavir-Cobicisctat-Emtricitabine-Tenofovir Alafenamide (SYMTUZA) 800-150-200-10 MG TABS 1 tablet        1 tablet Oral Daily with breakfast 12/19/19 0840     12/13/19 1000  bictegravir-emtricitabine-tenofovir AF (BIKTARVY) 50-200-25 MG per tablet 1 tablet  Status:  Discontinued        1 tablet Oral Daily 12/12/19 1413 12/19/19 0840   11/17/19 1548  ceFAZolin (ANCEF) 2-4 GM/100ML-% IVPB       Note to Pharmacy: Domenick Bookbinder   : cabinet override      11/17/19 1548 11/18/19 0359   11/16/19 1515  ceFAZolin (ANCEF) IVPB 2g/100 mL premix        2 g 200 mL/hr over 30 Minutes Intravenous To Radiology 11/16/19 1511 11/17/19 1625   11/15/19 0900  sulfamethoxazole-trimethoprim (BACTRIM DS) 800-160 MG per tablet 1 tablet        1 tablet Per Tube Once per day on Mon Wed Fri 11/13/19 1906     11/13/19 1615  dolutegravir (TIVICAY) tablet 50 mg  Status:  Discontinued        50 mg Oral Daily 11/13/19 1521 12/12/19 1413   11/13/19 1615  emtricitabine-tenofovir AF (DESCOVY) 200-25 MG per tablet 1 tablet  Status:  Discontinued        1 tablet Per Tube Daily 11/13/19 1521 12/12/19 1413   11/13/19 1530  sulfamethoxazole-trimethoprim (BACTRIM DS) 800-160 MG per tablet 1 tablet  Status:  Discontinued        1 tablet Oral Once per day on Mon Wed Fri 11/13/19 1441 11/13/19 1906   11/13/19 1500  bictegravir-emtricitabine-tenofovir AF (BIKTARVY) 50-200-25 MG per tablet 1 tablet  Status:  Discontinued        1 tablet Oral Daily 11/13/19 1403 11/13/19 1521   11/10/19 1000  erythromycin 250 mg in sodium chloride 0.9 % 100 mL IVPB        250 mg 100 mL/hr over 60 Minutes Intravenous Every 8 hours 11/10/19 0907 11/11/19 2000   11/07/19 1200  ceFEPIme (MAXIPIME) 2 g in sodium chloride 0.9 % 100 mL IVPB        2 g 200 mL/hr over 30 Minutes Intravenous Every 8 hours 11/07/19  1013 11/13/19 2127   11/03/19 1200  cefTRIAXone (ROCEPHIN) 2 g in sodium chloride 0.9 % 100 mL IVPB  2 g 200 mL/hr over 30 Minutes Intravenous Every 24 hours 11/03/19 0922 11/05/19 1427   11/02/19 0800  vancomycin (VANCOREADY) IVPB 750 mg/150 mL  Status:  Discontinued        750 mg 150 mL/hr over 60 Minutes Intravenous Every 12 hours 11/01/19 1935 11/02/19 1105   11/02/19 0600  piperacillin-tazobactam (ZOSYN) IVPB 3.375 g  Status:  Discontinued        3.375 g 12.5 mL/hr over 240 Minutes Intravenous Every 8 hours 11/02/19 0311 11/03/19 0920   11/01/19 2200  ceFEPIme (MAXIPIME) 2 g in sodium chloride 0.9 % 100 mL IVPB  Status:  Discontinued        2 g 200 mL/hr over 30 Minutes Intravenous Every 12 hours 11/01/19 2143 11/02/19 0311   11/01/19 1915  vancomycin (VANCOREADY) IVPB 1500 mg/300 mL        1,500 mg 150 mL/hr over 120 Minutes Intravenous  Once 11/01/19 1909 11/01/19 2147   11/01/19 1845  cefTRIAXone (ROCEPHIN) 1 g in sodium chloride 0.9 % 100 mL IVPB        1 g 200 mL/hr over 30 Minutes Intravenous  Once 11/01/19 1842 11/01/19 2051   11/01/19 1845  azithromycin (ZITHROMAX) 500 mg in sodium chloride 0.9 % 250 mL IVPB        500 mg 250 mL/hr over 60 Minutes Intravenous  Once 11/01/19 1842 11/01/19 2051       Time spent: 20 minutes    Erin Hearing ANP  [facility]  7 am-330 pm/M-F 01/29/2020, 10:51 AM  LOS: 89 days

## 2020-01-29 NOTE — Progress Notes (Signed)
[name], MRN:  [id number], DOB:  1965-10-06, LOS: 89 ADMISSION DATE:  11/01/2019, CONSULTATION DATE:  9/29 REFERRING MD:  EDP, CHIEF COMPLAINT:  Cardiac arrest   Brief History   54 y/o male with HIV found down outside a convenience store on 9/27, received out of hospital CPR.  After ICU admission has had acute encephalopathy, concern for anoxic brain injury.  Received a tracheostomy 10/13.    Past Medical History  ETOH Abuse HIV - dx 1995  Significant Hospital Events   9/29 Admit post PEA arrest.  UDS positive for THC, benzo's.  ETOH 177 10/05 EEG ongoing. Versed restarted overnight due to agitation. Nicardipine increased.  10/06 Developed tachypnea with WUA, no follow commands off sedation  10/07 Vomiting, TF held / restarted with recurrent vomiting 10/16 tracheostomy collar for 9 hours 10/18 Off vent all night  10/20 ATC 11/15 Trach changed to #4 shiley cuffless 11/22 start capping trials  Consults:  Neurology  Procedures:  ETT 9/29 >>10/13 Tracheostomy 10/3 >>   Significant Diagnostic Tests:  CT head 9/29 >  No evidence of acute intracranial abnormality. Moderate generalized cerebral atrophy, advanced for age. Chronic medially displaced fracture deformity of the right lamina papyracea. Mild ethmoid and maxillary sinus mucosal thickening.  CT cervical spine 9/29 > No evidence of acute fracture to the cervical spine. Partially imaged airspace disease within the imaged lung apices (extensive on the left). Clinical correlation is recommended. 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  EEG 9/29 > Patient was noted to have frequent episodes of axial jerking with eye opening. Concomitant EEG showed generalized polyspikes consistent with myoclonic seizures. EEG also showed continuous generalized background suppression. EEG was not reactive to tactile stimulation. Hyperventilation and photic  stimulation were not performed  ECHO 9/30 > No evidence of wall motion abnormality  MRI 10/3 > Mild DWI hyperintensity involving the caudate and possibly putamen bilaterally. Additionally, possible subtle FLAIR hyperintensity involving the cerebellum, cortex diffusely, and bilateral basal ganglia. Although not diagnostic, these findings could be seen with early hypoxic/ischemic brain injury in this patient status post PEA arrest.   EEG 10/08 > severe diffuse encephalopathy likely related to anoxic/hypoxic brain injury  Testicular US 10/10 > showed large left hydrocele and left inguinal hernia. No evidence of testicular mass.  MRI brain 10/11 >> evolving hypoxic, anoxic brain injury compared to previous MRI   CT abdomen pelvis 10/12 >>  Right inguinal hernia and partial small bowel obstruction, large left inguinal hernia  Abdominal x-ray: Small bowel obstruction   Micro Data:  BCx2 9/29 >> negative  Urine 9/29 >> UA few bacteria, WBC, RBC, CaOxalate, and Mucus Covid, Flu A/B 9/29 >> negative Tracheal aspirate 10/5 >> normal flora   Antimicrobials:  Vancomycin 9/29 >> 9/30 Cefepime 9/29 >> 9/30 Cefepime 10/5 >> 10/11  Interim history/subjective:  Remains encephalopathic on TC Secretions still present but seems able to control for now.  Objective   Blood pressure 136/77, pulse 94, temperature 97.8 F (36.6 C), temperature source Axillary, resp. rate 18, height 5\' 6"  (1.676 m), weight 74.5 kg, SpO2 96 %.    FiO2 (%):  [21 %] 21 %   Intake/Output Summary (Last 24 hours) at 01/29/2020 0904 Last data filed at 01/29/2020 0500 Gross per 24 hour  Intake --  Output 1600 ml  Net -1600 ml   Filed Weights   01/22/20 0412 01/23/20 0700 01/28/20 0500  Weight: 72 kg 74.1  kg 74.5 kg   Physical Exam: Constitutional: no acute distress  Eyes: disconjugate gaze with incomplete tracking Ears, nose, mouth, and throat: trach in place with thick secretions Cardiovascular: RRR, ext  warm Respiratory: Scattered rhonci, no wheezing Gastrointestinal: Soft, PEG in place Skin: No rashes, normal turgor Neurologic: spastic movements x 4 ext noted Psychiatric: RASS +1  Labs appear benign from 12/26  Resolved Hospital Problem list   Partial small bowel obstruction > resolved  Assessment & Plan:  54 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 S/p trach due to failure to wean from ventilator.  Tracheostomy in place for airway protection Anoxic brain injury secondary to cardiac arrest c/b myoclonic seizures  Continue routine trach care, TC, suctioning PRN  I have seen him multiple weeks, he has not and will not be a candidate for decannulation for forseeable future due to plateau in mental status, deconditioning and poor secretions clearance.  Agree that revisiting [facility] may be in order  PCCM will be available PRN  Myrla Halsted MD PCCM

## 2020-01-30 LAB — GLUCOSE, CAPILLARY
Glucose-Capillary: 100 mg/dL — ABNORMAL HIGH (ref 70–99)
Glucose-Capillary: 102 mg/dL — ABNORMAL HIGH (ref 70–99)
Glucose-Capillary: 108 mg/dL — ABNORMAL HIGH (ref 70–99)
Glucose-Capillary: 177 mg/dL — ABNORMAL HIGH (ref 70–99)
Glucose-Capillary: 88 mg/dL (ref 70–99)
Glucose-Capillary: 94 mg/dL (ref 70–99)

## 2020-01-30 LAB — URINALYSIS, ROUTINE W REFLEX MICROSCOPIC
Bilirubin Urine: NEGATIVE
Glucose, UA: NEGATIVE mg/dL
Hgb urine dipstick: NEGATIVE
Ketones, ur: NEGATIVE mg/dL
Nitrite: NEGATIVE
Protein, ur: NEGATIVE mg/dL
Specific Gravity, Urine: 1.019 (ref 1.005–1.030)
WBC, UA: >50 WBC/hpf — ABNORMAL HIGH (ref 0–5)
pH: 5 (ref 5.0–8.0)

## 2020-01-30 MED ORDER — OXYCODONE HCL 5 MG/5ML PO SOLN
5.0000 mg | Freq: Two times a day (BID) | ORAL | Status: DC
  Administered 2020-01-31 – 2020-02-01 (×3): 5 mg
  Filled 2020-01-30 (×3): qty 5

## 2020-01-30 NOTE — Progress Notes (Addendum)
[facility] PROGRESS NOTE  Ryan Orozco TMA:[id number] DOB: Jun 14, 1965 DOA: 11/01/2019 PCP: Default, Provider, MD    11/16   Status: Remains inpatient appropriate because:Altered mental status, Unsafe d/c plan and Inpatient level of care appropriate due to severity of illness. Patient newly diagnosed with anoxic brain injury  Dispo: The patient is from: Home              Anticipated d/c is to: SNF              Anticipated d/c date is: > 3 days              Patient currently is medically stable to d/c.  Barriers to discharge: No SNF bed offers.  Needs trach capable facility. TOC communicating with financial counseling team who have yet to hear back from DSS regarding patient's assigned caseworker.   Code Status: Full Family Communication: 12/25 dtr [name] updated by attending by telephone DVT prophylaxis: Subcutaneous heparin Vaccination status: Covid vaccination status unknown  Foley catheter: No  HPI: 54 year old male with HIV, chronic alcohol abuse who was presented to the emergency department after being found unresponsive outside a convinient store.  EMS found him pulseless in PEA, unknown downtime, successfully resuscitated and brought to the emergency department.  UDS positive for THC and benzodiazepines.  CT head unremarkable.  Failed extubation trials due to severe anoxic brain injury and had tracheostomy placed.  PEG placed on 10/15.  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  This has led to significant pain as well.  [name] was consulted and recommendations/input/orders highly appreciated.  She may also benefit from Botox injections to both hips to aid in treatment of spasticity.   Significant Hospital Events   9/29 Admit post PEA arrest. UDS positive for THC, benzo's. ETOH 177 10/05 EEG ongoing. Versed restarted overnight due to agitation. Nicardipine increased.  10/06  Developed tachypnea with WUA, no follow commands off sedation  10/07 Vomiting, TF held / restarted with recurrent vomiting 10/16 tracheostomy collar for 9 hours 10/18->10/19 off vent all night  10/20> TC 11/15 > downsized to #4 Shiley flex CFS  Subjective: Awakened.  Smiled when spoken to.  Began speaking mainly unintelligibly but seem to be saying the word high.  Did follow some simple commands when asked to raise arms.  Objective: Vitals:   01/30/20 0515 01/30/20 0837  BP: 133/76 124/77  Pulse: 86 84  Resp: 20 19  Temp: 97.8 F (36.6 C) 98.1 F (36.7 C)  SpO2: 97% 97%    Intake/Output Summary (Last 24 hours) at 01/30/2020 1012 Last data filed at 01/30/2020 0357 Gross per 24 hour  Intake --  Output 2100 ml  Net -2100 ml   Filed Weights   01/22/20 0412 01/23/20 0700 01/28/20 0500  Weight: 72 kg 74.1 kg 74.5 kg    Exam: General: Awake, interactive when engaged, no acute distress Pulmonary: Trach patent with trach collar in place-FiO2 28%.  Oximetry 97%.  No significant tracheal secretions seen.  Bilateral lung sounds are coarse but mainly clear and decreased in the bases no increased work of breathing at rest Cardiac: Heart sounds S1-S2 without rubs murmurs thrills or gallops.  No JVD.  No peripheral edema.  Pulses regular and nontachycardic. Abdomen: Abdomen soft nondistended.  Nontender.  PEG insertion site unremarkable.  Tube feeding infusing. Neurological: Alert and interactive when engaged.  Follows commands with upper extremities.  Continues with dystonic movements with purposeful activity although hypertonicity  in upper extremity significantly improved.  Movement primarily initiated from shoulder girth but is able to move forearm to grossly touch nose.  Does have spontaneous movement of left leg.  Continues with significant hypertonicity in flexion of right leg at knee and hip.  Assessment/Plan: Acute problems: Anoxic brain injury 2/2 PEA arrest/pain syndrome  secondary to hypertonicity/spastic tetraplegia:  -Presumed 2/2 combination alcohol and BZD overdose with UDS positive for THC and BZDs -Significant brain injury with underlying spastic tetraplegia long-term SNF placement recommended-continue PT/OT/SLP -Continue baclofen, gabapentin, scheduled oxycodone and Zanaflex 4 mg QID -Appreciate assistance of Dr. Swartz-12/27 recontacted [name] regarding consideration/evaluation for Botox to right hamstrings, rectus femoris -Continue bilateral upper extremity WHO resting splints along with PROM per nursing staff every 4 hours -Continue low-dose Seroquel for brain injury sequela -Continue scheduled Tylenol for pain along with Crea muscle rub -Continue KREG rotational bed  **PT/OT observed pt with altered cognition- suspect could be fue to Oxy IR so will decrease to every 12 hrs for several days then decrease to HS and lilely dc after 3-4 days. Will also r/o UTI  Acute hypoxemic respiratory failure/new tracheostomy tube requirement/Corynebacterium tracheobronchitis -Continue PMV training per SLP-#4 cuffless trach in place -Trach team signed off as of 12/27 given not a candidate for decannulation until patient can reliably follow commands, cough and clear airway. -Sputum culture consistent with abundant Corynebacterium-completed linezolid for a total of 7 days (LD due 12/23)-discussed with ID [name] who agrees with this treatment regimen -continue Mucomyst nebs with albuterol nebs -continue physiotherapy with Vibra vest along with supervised PMV trials -Follow-up chest x-ray on 12/24 with low lung volumes but no acute cardiopulmonary findings  HIV:  -CD4 count of 124 October 2021 (had been as high as 250 Dec 2020)-as of today up to 259 -Dc'd Tivicay and Descovy infavor of Biktarvy to for eventual dc to SNF; per my discussion with infectious disease physician [name] on 12/23 preferred regimen would be Descovy and Tivicay-she will contact the HIV  pharmacist to attempt to obtain discount cards to make this a more affordable option.  Once this has been confirmed will transition back to Descovy and Tivicay -CD4 count on 12/23 up to 259 -ID recommends continuing prophylactic Bactrim an additional 3 months  Dysphagia 2/2 anoxic brain injury -Continue tube feeding per PEG -Repeat SLP evaluation on 11/23 with recommendation for n.p.o. status  Goals of care:  -Palliative care was involved during this hospitalization.   -Goals of care were discussed. remains full code.  - Palliative care recommended outpatient follow-up with palliative providers. -May need to revisit end-of-life goals of care since patient making poor progress and currently having significant pain related to hypertonicity from brain injury  Protein calorie malnutrition nutrition Status: Nutrition Problem: Increased nutrient needs Etiology: acute illness Signs/Symptoms: estimated needs Interventions: Tube feeding bolus doses of Osmolite, Juven twice daily, Prosource TF 45 mL 3 times daily Estimated body mass index is 26.5 kg/m as calculated from the following:   Height as of this encounter: 5' 6"  (1.676 m).   Weight as of this encounter: 74.5 kg.   Multiple decubiti not POA Wound / Incision (Open or Dehisced) 11/17/19 Puncture Abdomen Left;Anterior;Upper G-tube insertion site  (Active)  Date First Assessed/Time First Assessed: 11/17/19 1646   Wound Type: Puncture  Location: Abdomen  Location Orientation: Left;Anterior;Upper  Wound Description (Comments): G-tube insertion site   Present on Admission: No    Assessments 11/17/2019  4:47 PM 01/29/2020 10:33 PM  Dressing Type Gauze (Comment);Tape  dressing Gauze (Comment);Tape dressing  Dressing Changed New --  Dressing Status Clean;Dry;Intact Clean;Dry;Intact  Dressing Change Frequency PRN --  Site / Wound Assessment Clean;Dry;Pink --  Peri-wound Assessment Intact --  Closure None --  Drainage Amount None --  Treatment  Cleansed --     No Linked orders to display      Other problems: Hypertension/grade 1 diastolic dysfunction:  -Continue amlodipine  -Echocardiogram this admission with preserved LVEF with evidence of grade   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   -Continue Keppra; levetiracetam level 35.9 on 11/12 -no further seizure activity since transitioned out of ICU setting therefore will discontinue seizure precaution  Inguinal hernias -Evaluated by general surgery this admission. Documented as chronically incarcerated. Patient has been clinically stable and tolerating tube feedings and having bowel movements so unless patient obstructed would not be considered a candidate for repair. Even if obstructed consulting surgeon was not sure he would be a candidate for hernia repair regardless .   Data Reviewed: Basic Metabolic Panel: Recent Labs  Lab 01/24/20 0120 01/28/20 0200  NA 145 142  K 4.0 4.7  CL 107 105  CO2 27 26  GLUCOSE 101* 112*  BUN 21* 21*  CREATININE 0.66 0.81  CALCIUM 10.0 10.3  MG  --  1.9   Liver Function Tests: Recent Labs  Lab 01/28/20 0200  AST 32  ALT 27  ALKPHOS 106  BILITOT 0.6  PROT 7.8  ALBUMIN 3.5   No results for input(s): LIPASE, AMYLASE in the last 168 hours. No results for input(s): AMMONIA in the last 168 hours. CBC: Recent Labs  Lab 01/28/20 0200  WBC 4.9  NEUTROABS 2.3  HGB 14.2  HCT 44.8  MCV 95.9  PLT 193   Cardiac Enzymes: No results for input(s): CKTOTAL, CKMB, CKMBINDEX, TROPONINI in the last 168 hours. BNP (last 3 results) No results for input(s): BNP in the last 8760 hours.  ProBNP (last 3 results) No results for input(s): PROBNP in the last 8760 hours.  CBG: Recent Labs  Lab 01/29/20 1556 01/29/20 1934 01/29/20 2301 01/30/20 0314 01/30/20 0758  GLUCAP 100* 86 113* 102* 108*    No results found for this or any previous visit (from the past 240 hour(s)).   Studies: No results  found.  Scheduled Meds: . acetaminophen (TYLENOL) oral liquid 160 mg/5 mL  650 mg Per Tube Q6H  . amLODipine  10 mg Per Tube Daily  . baclofen  30 mg Per Tube TID  . chlorhexidine gluconate (MEDLINE KIT)  15 mL Mouth Rinse BID  . Darunavir-Cobicisctat-Emtricitabine-Tenofovir Alafenamide  1 tablet Oral Q breakfast  . famotidine  10.4 mg Per Tube BID  . feeding supplement (OSMOLITE 1.5 CAL)  237 mL Per Tube Q24H  . feeding supplement (OSMOLITE 1.5 CAL)  474 mL Per Tube TID  . feeding supplement (PROSource TF)  45 mL Per Tube TID  . fiber  1 packet Per Tube BID  . folic acid  1 mg Per Tube Daily  . free water  200 mL Per Tube Q4H  . gabapentin  200 mg Per Tube Q8H  . heparin injection (subcutaneous)  5,000 Units Subcutaneous Q8H  . levETIRAcetam  1,500 mg Per Tube BID  . Muscle Rub   Topical QID  . oxyCODONE  5 mg Per Tube Q8H  . QUEtiapine  12.5 mg Per Tube QHS  . scopolamine  1 patch Transdermal Q72H  . sulfamethoxazole-trimethoprim  1 tablet Per Tube Once per day on  Mon Wed Fri  . thiamine  100 mg Per Tube Daily  . tiZANidine  4 mg Per Tube QID   Continuous Infusions:   Active Problems:   Cardiac arrest [facility])   Community acquired pneumonia of left upper lobe of lung   Anoxic brain injury ([place])   Alcohol abuse   Acute respiratory failure with hypoxia ([facility])   Vomiting   Pressure injury of skin   Small bowel obstruction ([place])   Palliative care encounter   Bowel obstruction ([facility])   Chronic respiratory failure ([facility])   Diastolic dysfunction   Tracheostomy dependent ([place])   Dysphagia   Protein-calorie malnutrition ([place])   Seizures ([place])   Bilateral recurrent inguinal hernia   Muscle spasticity   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[place])   Tracheobronchitis   Sequela of Corynebacterium infection   Consultants:  PCCM  Palliative medicine  General surgery  Neurology  Interventional  radiology  Procedures:  EEG  Echocardiogram  Tracheostomy  Antibiotics: Anti-infectives (From admission, onward)   Start     Dose/Rate Route Frequency Ordered Stop   01/19/20 2200  linezolid (ZYVOX) tablet 600 mg        600 mg Per Tube Every 12 hours 01/19/20 1534 01/25/20 2019   01/19/20 1100  levofloxacin (LEVAQUIN) tablet 500 mg  Status:  Discontinued        500 mg Per Tube Daily 01/19/20 1004 01/19/20 1534   12/19/19 0930  Darunavir-Cobicisctat-Emtricitabine-Tenofovir Alafenamide (SYMTUZA) 800-150-200-10 MG TABS 1 tablet        1 tablet Oral Daily with breakfast 12/19/19 0840     12/13/19 1000  bictegravir-emtricitabine-tenofovir AF (BIKTARVY) 50-200-25 MG per tablet 1 tablet  Status:  Discontinued        1 tablet Oral Daily 12/12/19 1413 12/19/19 0840   11/17/19 1548  ceFAZolin (ANCEF) 2-4 GM/100ML-% IVPB       Note to Pharmacy: Domenick Bookbinder   : cabinet override      11/17/19 1548 11/18/19 0359   11/16/19 1515  ceFAZolin (ANCEF) IVPB 2g/100 mL premix        2 g 200 mL/hr over 30 Minutes Intravenous To Radiology 11/16/19 1511 11/17/19 1625   11/15/19 0900  sulfamethoxazole-trimethoprim (BACTRIM DS) 800-160 MG per tablet 1 tablet        1 tablet Per Tube Once per day on Mon Wed Fri 11/13/19 1906     11/13/19 1615  dolutegravir (TIVICAY) tablet 50 mg  Status:  Discontinued        50 mg Oral Daily 11/13/19 1521 12/12/19 1413   11/13/19 1615  emtricitabine-tenofovir AF (DESCOVY) 200-25 MG per tablet 1 tablet  Status:  Discontinued        1 tablet Per Tube Daily 11/13/19 1521 12/12/19 1413   11/13/19 1530  sulfamethoxazole-trimethoprim (BACTRIM DS) 800-160 MG per tablet 1 tablet  Status:  Discontinued        1 tablet Oral Once per day on Mon Wed Fri 11/13/19 1441 11/13/19 1906   11/13/19 1500  bictegravir-emtricitabine-tenofovir AF (BIKTARVY) 50-200-25 MG per tablet 1 tablet  Status:  Discontinued        1 tablet Oral Daily 11/13/19 1403 11/13/19 1521   11/10/19 1000   erythromycin 250 mg in sodium chloride 0.9 % 100 mL IVPB        250 mg 100 mL/hr over 60 Minutes Intravenous Every 8 hours 11/10/19 0907 11/11/19 2000   11/07/19 1200  ceFEPIme (MAXIPIME) 2 g in sodium chloride 0.9 % 100 mL IVPB  2 g 200 mL/hr over 30 Minutes Intravenous Every 8 hours 11/07/19 1013 11/13/19 2127   11/03/19 1200  cefTRIAXone (ROCEPHIN) 2 g in sodium chloride 0.9 % 100 mL IVPB        2 g 200 mL/hr over 30 Minutes Intravenous Every 24 hours 11/03/19 0922 11/05/19 1427   11/02/19 0800  vancomycin (VANCOREADY) IVPB 750 mg/150 mL  Status:  Discontinued        750 mg 150 mL/hr over 60 Minutes Intravenous Every 12 hours 11/01/19 1935 11/02/19 1105   11/02/19 0600  piperacillin-tazobactam (ZOSYN) IVPB 3.375 g  Status:  Discontinued        3.375 g 12.5 mL/hr over 240 Minutes Intravenous Every 8 hours 11/02/19 0311 11/03/19 0920   11/01/19 2200  ceFEPIme (MAXIPIME) 2 g in sodium chloride 0.9 % 100 mL IVPB  Status:  Discontinued        2 g 200 mL/hr over 30 Minutes Intravenous Every 12 hours 11/01/19 2143 11/02/19 0311   11/01/19 1915  vancomycin (VANCOREADY) IVPB 1500 mg/300 mL        1,500 mg 150 mL/hr over 120 Minutes Intravenous  Once 11/01/19 1909 11/01/19 2147   11/01/19 1845  cefTRIAXone (ROCEPHIN) 1 g in sodium chloride 0.9 % 100 mL IVPB        1 g 200 mL/hr over 30 Minutes Intravenous  Once 11/01/19 1842 11/01/19 2051   11/01/19 1845  azithromycin (ZITHROMAX) 500 mg in sodium chloride 0.9 % 250 mL IVPB        500 mg 250 mL/hr over 60 Minutes Intravenous  Once 11/01/19 1842 11/01/19 2051       Time spent: 20 minutes    Erin Hearing ANP  [facility]  7 am-330 pm/M-F 01/30/2020, 10:12 AM  LOS: 90 days

## 2020-01-30 NOTE — Progress Notes (Signed)
  Speech Language Pathology Treatment:    Patient Details Name: Ryan Orozco MRN: [id number] DOB: 08-Jun-1965 Today's Date: 01/30/2020 Time: 8338-2505 SLP Time Calculation (min) (ACUTE ONLY): 10 min  Assessment / Plan / Recommendation Clinical Impression  Continued intervention with attempts for PO readiness and PMSV tolerance. Pt on trach collar,RN reports recent tracheal suction by RT.  PMSV placed without difficulty. Pt to continue PMSV during wake hours as tolerated. Pt vocalizing some however verbal output overall is reduced with decreased interaction from pt with SLP. Attempted PO ice chips and puree trials though pt orally defensive, pushing spoon away despite education. Of note, during last SLP interaction, pt accepting POs without defensiveness. Will continue to follow.   HPI HPI: 54 y/o male with hx of HIV, ETOH abuse found down outside a convenience store on 9/27, received out of hospital CPR. Presents with acute hypoxic respiratory failure s/p cardiac arrest, acute encephalopathy due to hypoxic brain injury. Intubated 9/27-10/13. Received a tracheostomy 10/13, treachestomy collar trial for 9 hours on 10/16. CXR on 10/17 improving and shows left lung opacities have essentially resolved      SLP Plan  Continue with current plan of care       Recommendations  Diet recommendations: NPO Medication Administration: Via alternative means      Patient may use Passy-Muir Speech Valve: During all waking hours (remove during sleep) PMSV Supervision: Intermittent         Oral Care Recommendations: Oral care QID Follow up Recommendations: Skilled Nursing facility SLP Visit Diagnosis: Cognitive communication deficit (R41.841);Dysphagia, unspecified (R13.10) Plan: Continue with current plan of care       GO                Colt Martelle E Mariana Goytia MA, CCC-SLP Acute Rehabilitation Services  01/30/2020, 1:33 PM

## 2020-01-30 NOTE — Progress Notes (Signed)
Occupational Therapy Treatment Patient Details Name: Ryan Orozco MRN: [id number] DOB: 10-Jun-1965 Today's Date: 01/30/2020    History of present illness 54 year old male found down outside convenience store 9/27. Pt with PEA and hypoxic brain injury. Trach 10/13, PEG 10/15. PMHx: HIV AIDS and alcohol abuse   OT comments  OT/PT co-treatment with focus on functional cognition, bed mobility, unsupported static sitting at EOB, contracture management, and positioning to decrease risk of skin breakdown. Patient with strong lateral and posterior pushing this date but able to progress to brief periods of Max A. Patient continues to require Total A +2 for all self-care tasks, functional transfers, and for bed mobility indicating need for continued acute OT services. Plan to establish splint wearing schedule at time of next session. Continued recommendation for SNF rehab.    Follow Up Recommendations  SNF;Supervision/Assistance - 24 hour    Equipment Recommendations  None recommended by OT    Recommendations for Other Services      Precautions / Restrictions Precautions Precautions: Fall Precaution Comments: trach, PEG, anoxic, flexor tone R>L LE/UE Restrictions Weight Bearing Restrictions: No       Mobility Bed Mobility Overal bed mobility: Needs Assistance Bed Mobility: Rolling;Supine to Sit;Sit to Supine Rolling: Total assist;+2 for physical assistance;+2 for safety/equipment   Supine to sit: Total assist;+2 for physical assistance;+2 for safety/equipment Sit to supine: Total assist;+2 for physical assistance;+2 for safety/equipment   General bed mobility comments: Patient unable to actively assist 2/2 flexor tone in all 4 extremities.  Transfers Overall transfer level: Needs assistance Equipment used: 2 person hand held assist             General transfer comment: Deferred transfer to recliner for safety. Patient with strong lateral and posterior pushing this  date.    Balance Overall balance assessment: Needs assistance Sitting-balance support: Feet unsupported;Bilateral upper extremity supported;Single extremity supported Sitting balance-Leahy Scale: Zero Sitting balance - Comments: can have moments of greater sitting balance control but are when pt stops pushing to R side and relaxes on support pillows on sides                                   ADL either performed or assessed with clinical judgement   ADL                                               Vision       Perception     Praxis      Cognition Arousal/Alertness: Awake/alert Behavior During Therapy: WFL for tasks assessed/performed Overall Cognitive Status: Impaired/Different from baseline Area of Impairment: Awareness;Safety/judgement;Following commands;Memory;Attention;Orientation;Problem solving                 Orientation Level: Situation;Time Current Attention Level: Sustained Memory: Decreased short-term memory;Decreased recall of precautions Following Commands: Follows one step commands inconsistently;Follows one step commands with increased time Safety/Judgement: Decreased awareness of safety;Decreased awareness of deficits Awareness: Intellectual Problem Solving: Slow processing;Requires verbal cues;Requires tactile cues General Comments: pt was assisted to balance in unsupported sitting on side of bed but lists to R side often, requiring use of pillows to establish more midline        Exercises Exercises: General Lower Extremity General Exercises - Upper Extremity Shoulder Flexion: PROM;Both;10 reps;Seated (EOB) Shoulder ABduction: PROM;Both;Seated;10 reps (EOB) Shoulder  ADduction: Other (comment) (retraction and depression of scapula) Elbow Flexion: PROM;Both;10 reps;Seated (EOB) Elbow Extension: PROM;Both;5 reps;Seated (EOB) General Exercises - Lower Extremity Long Arc Quad: AAROM;Both;15 reps Hip  Flexion/Marching: AAROM;10 reps   Shoulder Instructions       General Comments Pt is up to side of bed with two assist and demonstrating awareness of LUE use with OT, more painful RLE.  Tolerating stretch better on LLE, RLE more mobile after session and repositioned to maintain the gain.  R knee is moist underneath due to approximation of thigh and lower leg    Pertinent Vitals/ Pain       Pain Assessment: Faces Faces Pain Scale: Hurts even more Pain Location: RLE with stretching (high tone), and tissue pressure on R scapula Pain Descriptors / Indicators: Grimacing;Guarding;Tender Pain Intervention(s): Limited activity within patient's tolerance;Repositioned;Monitored during session  Home Living                                          Prior Functioning/Environment              Frequency  Min 2X/week        Progress Toward Goals  OT Goals(current goals can now be found in the care plan section)  Progress towards OT goals: Progressing toward goals  Acute Rehab OT Goals Patient Stated Goal: unable to state OT Goal Formulation: Patient unable to participate in goal setting Time For Goal Achievement: 02/07/20 Potential to Achieve Goals: Fair ADL Goals Additional ADL Goal #1: Pt will follow one step appendicular commands 3/5 trials Additional ADL Goal #2: Pt will wash face with moderate assist/hand over hand assist Additional ADL Goal #3: Family will be independent with PROM and positioning bil. UEs Additional ADL Goal #4: Pt will maintain EOB sitting with max A in prep for functional mobility  Plan Discharge plan remains appropriate    Co-evaluation      Reason for Co-Treatment: For patient/therapist safety;Complexity of the patient's impairments (multi-system involvement) PT goals addressed during session: Mobility/safety with mobility;Balance;Strengthening/ROM OT goals addressed during session: Strengthening/ROM;ADL's and self-care      AM-PAC  OT "6 Clicks" Daily Activity     Outcome Measure   Help from another person eating meals?: Total Help from another person taking care of personal grooming?: Total Help from another person toileting, which includes using toliet, bedpan, or urinal?: Total Help from another person bathing (including washing, rinsing, drying)?: Total Help from another person to put on and taking off regular upper body clothing?: Total Help from another person to put on and taking off regular lower body clothing?: Total 6 Click Score: 6    End of Session Equipment Utilized During Treatment: Oxygen  OT Visit Diagnosis: Cognitive communication deficit (R41.841);Muscle weakness (generalized) (M62.81);Pain;Hemiplegia and hemiparesis Pain - Right/Left: Right Pain - part of body: Shoulder;Arm;Hand;Leg (With PROM/AAROM)   Activity Tolerance Patient tolerated treatment well   Patient Left in bed;with call bell/phone within reach;with bed alarm set   Nurse Communication Mobility status        Time: 0240-9735 OT Time Calculation (min): 27 min  Charges: OT General Charges $OT Visit: 1 Visit OT Treatments $Therapeutic Activity: 8-22 mins  Airyanna Dipalma H. OTR/L Supplemental OT, Department of rehab services [phone]   Nickalus Thornsberry R H. 01/30/2020, 1:24 PM

## 2020-01-30 NOTE — Progress Notes (Signed)
Physical Therapy Treatment Patient Details Name: Ryan Orozco MRN: [id number] DOB: 1965/08/10 Today's Date: 01/30/2020    History of Present Illness 54 year old male found down outside convenience store 9/27. Pt with PEA and hypoxic brain injury. Trach 10/13, PEG 10/15. PMHx: HIV AIDS and alcohol abuse    PT Comments    Pt was seen for sitting balance and mobility to maintain this posture with as much self correction as could be attained.  Pt is listing to R with more force at times, as well as posteriorly, and these movements make a trip to recliner more challenging.  Pt is more comfortable with RUE after ROM and scapular mobility was done with PT and OT together.  Repositioned on bed with R hip abducted, with as much comfortable extension as could be gained on R knee, and used soft space filler with pillow.  Pt was relaxed at end of session, turned and supported with more neutral hip and shoulder postures.  Follow Up Recommendations  SNF     Equipment Recommendations  Hospital bed    Recommendations for Other Services       Precautions / Restrictions Precautions Precautions: Fall Precaution Comments: trach, PEG, anoxic, flexor tone R>L LE/UE Restrictions Weight Bearing Restrictions: No    Mobility  Bed Mobility Overal bed mobility: Needs Assistance Bed Mobility: Rolling;Supine to Sit;Sit to Supine Rolling: Total assist;+2 for physical assistance;+2 for safety/equipment   Supine to sit: Total assist;+2 for physical assistance;+2 for safety/equipment Sit to supine: Total assist;+2 for physical assistance;+2 for safety/equipment   General bed mobility comments: flexion tone on RLE is most prohibitive of moving and LLE more comfortable after therapy  Transfers Overall transfer level: Needs assistance Equipment used: 2 person hand held assist             General transfer comment: pt was resisting sitting with strong trunk extension at end of time on sideof bed, so  did not get up to recliner  Ambulation/Gait             General Gait Details: unable   Stairs             Wheelchair Mobility    Modified Rankin (Stroke Patients Only)       Balance Overall balance assessment: Needs assistance Sitting-balance support: Feet unsupported;Bilateral upper extremity supported Sitting balance-Leahy Scale: Zero Sitting balance - Comments: can have moments of greater sitting balance control but are when pt stops pushing to R side and relaxes on support pillows on sides                                    Cognition Arousal/Alertness: Awake/alert Behavior During Therapy: WFL for tasks assessed/performed Overall Cognitive Status: Impaired/Different from baseline Area of Impairment: Awareness;Safety/judgement;Following commands;Memory;Attention;Orientation;Problem solving                 Orientation Level: Situation;Time Current Attention Level: Sustained Memory: Decreased short-term memory;Decreased recall of precautions Following Commands: Follows one step commands inconsistently;Follows one step commands with increased time Safety/Judgement: Decreased awareness of safety;Decreased awareness of deficits Awareness: Intellectual Problem Solving: Slow processing;Requires verbal cues;Requires tactile cues General Comments: pt was assisted to balance in unsupported sitting on side of bed but lists to R side often, requiring use of pillows to establish more midline      Exercises General Exercises - Upper Extremity Shoulder ADduction: Other (comment) (retraction and depression of scapula) General Exercises - Lower Extremity  Long Arc Quad: AAROM;Both;15 reps Hip Flexion/Marching: AAROM;10 reps    General Comments General comments (skin integrity, edema, etc.): Pt is up to side of bed with two assist and demonstrating awareness of LUE use with OT, more painful RLE.  Tolerating stretch better on LLE, RLE more mobile after  session and repositioned to maintain the gain.  R knee is moist underneath due to approximation of thigh and lower leg      Pertinent Vitals/Pain Pain Assessment: Faces Faces Pain Scale: Hurts even more Pain Location: RLE with stretching (high tone), and tissue pressure on R scapula Pain Descriptors / Indicators: Grimacing;Guarding;Tender Pain Intervention(s): Limited activity within patient's tolerance;Monitored during session;Repositioned    Home Living                      Prior Function            PT Goals (current goals can now be found in the care plan section) Acute Rehab PT Goals Patient Stated Goal: unable to state Progress towards PT goals: Progressing toward goals    Frequency    Min 2X/week      PT Plan Current plan remains appropriate    Co-evaluation PT/OT/SLP Co-Evaluation/Treatment: Yes Reason for Co-Treatment: For patient/therapist safety PT goals addressed during session: Mobility/safety with mobility;Balance;Strengthening/ROM        AM-PAC PT "6 Clicks" Mobility   Outcome Measure  Help needed turning from your back to your side while in a flat bed without using bedrails?: Total Help needed moving from lying on your back to sitting on the side of a flat bed without using bedrails?: Total Help needed moving to and from a bed to a chair (including a wheelchair)?: Total Help needed standing up from a chair using your arms (e.g., wheelchair or bedside chair)?: Total Help needed to walk in hospital room?: Total Help needed climbing 3-5 steps with a railing? : Total 6 Click Score: 6    End of Session Equipment Utilized During Treatment: Oxygen Activity Tolerance: Treatment limited secondary to medical complications (Comment);Patient limited by pain Patient left: in bed;with call bell/phone within reach;Other (comment) (repositioning to maintain ROM from LE ex, to extend R knee and abd hips) Nurse Communication: Other (comment);Mobility status  (moist skin behind R knee) PT Visit Diagnosis: Other abnormalities of gait and mobility (R26.89);Muscle weakness (generalized) (M62.81)     Time: 7782-4235 PT Time Calculation (min) (ACUTE ONLY): 28 min  Charges:  $Neuromuscular Re-education: 8-22 mins                    Ivar Drape 01/30/2020, 1:09 PM  Samul Dada, PT MS Acute Rehab Dept. Number: [facility] [id number] and [facility] [phone]

## 2020-01-30 NOTE — Plan of Care (Signed)
  Problem: Education: Goal: Knowledge of General Education information will improve Description: Including pain rating scale, medication(s)/side effects and non-pharmacologic comfort measures Outcome: Not Progressing   Problem: Health Behavior/Discharge Planning: Goal: Ability to manage health-related needs will improve Outcome: Not Progressing   Problem: Clinical Measurements: Goal: Ability to maintain clinical measurements within normal limits will improve Outcome: Not Progressing Goal: Will remain free from infection Outcome: Not Progressing Goal: Diagnostic test results will improve Outcome: Not Progressing Goal: Respiratory complications will improve Outcome: Not Progressing Goal: Cardiovascular complication will be avoided Outcome: Not Progressing   Problem: Activity: Goal: Risk for activity intolerance will decrease Outcome: Not Progressing   Problem: Nutrition: Goal: Adequate nutrition will be maintained Outcome: Not Progressing   Problem: Coping: Goal: Level of anxiety will decrease Outcome: Not Progressing   Problem: Elimination: Goal: Will not experience complications related to bowel motility Outcome: Not Progressing Goal: Will not experience complications related to urinary retention Outcome: Not Progressing   Problem: Pain Managment: Goal: General experience of comfort will improve Outcome: Not Progressing   Problem: Safety: Goal: Ability to remain free from injury will improve Outcome: Not Progressing   Problem: Skin Integrity: Goal: Risk for impaired skin integrity will decrease Outcome: Not Progressing   Problem: Education: Goal: 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 Outcome: Not Progressing   Problem: Activity: Goal: Ability to tolerate increased activity will improve Outcome: Not Progressing   Problem: Health Behavior/Discharge Planning: Goal: 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 Outcome: Not Progressing    Problem: Respiratory: Goal: Patent airway maintenance will improve Outcome: Not Progressing   Problem: Role Relationship: Goal: Ability to communicate will improve Outcome: Not Progressing   Problem: Education: Goal: Knowledge of the prescribed therapeutic regimen Outcome: Not Progressing Goal: Knowledge of disease or condition will improve Outcome: Not Progressing   Problem: Clinical Measurements: Goal: Neurologic status will improve Outcome: Not Progressing   Problem: Tissue Perfusion: Goal: Ability to maintain intracranial pressure will improve Outcome: Not Progressing   Problem: Respiratory: Goal: Will regain and/or maintain adequate ventilation Outcome: Not Progressing   Problem: Skin Integrity: Goal: Risk for impaired skin integrity will decrease Outcome: Not Progressing Goal: Demonstration of wound healing without infection will improve Outcome: Not Progressing   Problem: Psychosocial: Goal: Ability to verbalize positive feelings about self will improve Outcome: Not Progressing Goal: Ability to participate in self-care as condition permits will improve Outcome: Not Progressing Goal: Ability to identify appropriate support needs will improve Outcome: Not Progressing   Problem: Health Behavior/Discharge Planning: Goal: Ability to manage health-related needs will improve Outcome: Not Progressing   Problem: Nutritional: Goal: Risk of aspiration will decrease Outcome: Not Progressing Goal: Dietary intake will improve Outcome: Not Progressing   Problem: Communication: Goal: Ability to communicate needs accurately will improve Outcome: Not Progressing   

## 2020-01-31 DIAGNOSIS — R8290 Unspecified abnormal findings in urine: Secondary | ICD-10-CM

## 2020-01-31 LAB — GLUCOSE, CAPILLARY
Glucose-Capillary: 101 mg/dL — ABNORMAL HIGH (ref 70–99)
Glucose-Capillary: 95 mg/dL (ref 70–99)
Glucose-Capillary: 128 mg/dL — ABNORMAL HIGH (ref 70–99)
Glucose-Capillary: 140 mg/dL — ABNORMAL HIGH (ref 70–99)
Glucose-Capillary: 151 mg/dL — ABNORMAL HIGH (ref 70–99)

## 2020-01-31 MED ORDER — LEVOFLOXACIN 25 MG/ML PO SOLN
500.0000 mg | Freq: Every day | ORAL | Status: DC
  Administered 2020-01-31 – 2020-02-01 (×2): 500 mg
  Filled 2020-01-31 (×2): qty 20

## 2020-01-31 MED ORDER — SODIUM CHLORIDE 0.9 % IV SOLN
1.0000 g | INTRAVENOUS | Status: DC
  Filled 2020-01-31: qty 10

## 2020-01-31 NOTE — Plan of Care (Signed)
  Problem: Clinical Measurements: Goal: Ability to maintain clinical measurements within normal limits will improve Outcome: Progressing Goal: Will remain free from infection Outcome: Progressing Goal: Diagnostic test results will improve Outcome: Progressing Goal: Respiratory complications will improve Outcome: Progressing Goal: Cardiovascular complication will be avoided Outcome: Progressing   Problem: Nutrition: Goal: Adequate nutrition will be maintained Outcome: Progressing   Problem: Elimination: Goal: Will not experience complications related to bowel motility Outcome: Progressing Goal: Will not experience complications related to urinary retention Outcome: Progressing   Problem: Pain Managment: Goal: General experience of comfort will improve Outcome: Progressing   Problem: Safety: Goal: Ability to remain free from injury will improve Outcome: Progressing   Problem: Skin Integrity: Goal: Risk for impaired skin integrity will decrease Outcome: Progressing   Problem: Education: Goal: 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 Outcome: Progressing   Problem: Activity: Goal: Ability to tolerate increased activity will improve Outcome: Progressing   Problem: Health Behavior/Discharge Planning: Goal: 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 Outcome: Progressing   Problem: Respiratory: Goal: Patent airway maintenance will improve Outcome: Progressing   Problem: Role Relationship: Goal: Ability to communicate will improve Outcome: Progressing   Problem: Tissue Perfusion: Goal: Ability to maintain intracranial pressure will improve Outcome: Progressing   Problem: Respiratory: Goal: Will regain and/or maintain adequate ventilation Outcome: Progressing   Problem: Skin Integrity: Goal: Risk for impaired skin integrity will decrease Outcome: Progressing Goal: Demonstration of wound healing without infection will improve Outcome:  Progressing   Problem: Psychosocial: Goal: Ability to verbalize positive feelings about self will improve Outcome: Progressing Goal: Ability to participate in self-care as condition permits will improve Outcome: Progressing Goal: Ability to identify appropriate support needs will improve Outcome: Progressing   Problem: Health Behavior/Discharge Planning: Goal: Ability to manage health-related needs will improve Outcome: Progressing   Problem: Nutritional: Goal: Risk of aspiration will decrease Outcome: Progressing Goal: Dietary intake will improve Outcome: Progressing   Problem: Communication: Goal: Ability to communicate needs accurately will improve Outcome: Progressing

## 2020-01-31 NOTE — Plan of Care (Signed)
  Problem: Education: Goal: Knowledge of General Education information will improve Description Including pain rating scale, medication(s)/side effects and non-pharmacologic comfort measures Outcome: Progressing   

## 2020-01-31 NOTE — Progress Notes (Addendum)
[facility] PROGRESS NOTE  Ryan Orozco CWC:[id number] DOB: 09/06/1965 DOA: 11/01/2019 PCP: Default, Provider, MD    11/16   Status: Remains inpatient appropriate because:Altered mental status, Unsafe d/c plan and Inpatient level of care appropriate due to severity of illness. Patient newly diagnosed with anoxic brain injury  Dispo: The patient is from: Home              Anticipated d/c is to: SNF              Anticipated d/c date is: > 3 days              Patient currently is medically stable to d/c.  Barriers to discharge: No SNF bed offers.  Needs trach capable facility. TOC communicating with financial counseling team who have yet to hear back from DSS regarding patient's assigned caseworker.   Code Status: Full Family Communication: 12/29 dtr [name] updated DVT prophylaxis: Subcutaneous heparin Vaccination status: Covid vaccination status unknown  Foley catheter: No  HPI: 54 year old male with HIV, chronic alcohol abuse who was presented to the emergency department after being found unresponsive outside a convinient store.  EMS found him pulseless in PEA, unknown downtime, successfully resuscitated and brought to the emergency department.  UDS positive for THC and benzodiazepines.  CT head unremarkable.  Failed extubation trials due to severe anoxic brain injury and had tracheostomy placed.  PEG placed on 10/15.  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  This has led to significant pain as well.  [name] was consulted and recommendations/input/orders highly appreciated.  She may also benefit from Botox injections to both hips to aid in treatment of spasticity.   Significant Hospital Events   9/29 Admit post PEA arrest. UDS positive for THC, benzo's. ETOH 177 10/05 EEG ongoing. Versed restarted overnight due to agitation. Nicardipine increased.  10/06 Developed tachypnea with WUA, no  follow commands off sedation  10/07 Vomiting, TF held / restarted with recurrent vomiting 10/16 tracheostomy collar for 9 hours 10/18->10/19 off vent all night  10/20> TC 11/15 > downsized to #4 Shiley flex CFS  Subjective: More lethargic today.  Head of bed was down.  Patient with limited verbal reaction but if I asked him if he wanted the head of his bed raised he stated "high, high"  Objective: Vitals:   01/31/20 0533 01/31/20 0743  BP: 116/82 127/89  Pulse: 98 82  Resp: 18 20  Temp: 98.2 F (36.8 C) 98.6 F (37 C)  SpO2: 98% 100%    Intake/Output Summary (Last 24 hours) at 01/31/2020 1019 Last data filed at 01/30/2020 1836 Gross per 24 hour  Intake --  Output 550 ml  Net -550 ml   Filed Weights   01/22/20 0412 01/23/20 0700 01/28/20 0500  Weight: 72 kg 74.1 kg 74.5 kg    Exam: General: Awakened, much less interactive today Pulmonary: Midline tracheostomy tube stable with minimal drainage.  Trach collar in place with FiO2 28%.  No increased work of breathing.  Lung sounds clear to auscultation and decreased in the bases Cardiac: Pulse is regular and nontachycardic, heart sounds S1-S2, BP stable Abdomen: Sounds present, abdomen soft and nontender including over suprapubic region, PEG tube for tube feedings with site unremarkable. LBM gait Neurological: More lethargic than baseline, does awaken and open eyes and responds verbally when spoken to as documented above.  At baseline has dystonic movements involving the upper extremities but demonstrates purposeful movement more so with  left upper extremity movement is gross motor in etiology and originates from the shoulders.  Marked decreased tonicity and spasticity and left lower extremity.  Continues with significant hypertonicity and flexion of RLE at knee and hip.  Assessment/Plan: Acute problems: Anoxic brain injury 2/2 PEA arrest/pain syndrome secondary to hypertonicity/spastic tetraplegia:  -Presumed 2/2 combination  alcohol and BZD overdose with UDS positive for THC and BZDs -Significant brain injury with underlying spastic tetraplegia long-term SNF placement recommended-continue PT/OT/SLP -Continue baclofen, gabapentin, scheduled oxycodone and Zanaflex 4 mg QID -Appreciate assistance of Dr. Swartz-12/27 recontacted [name] regarding consideration/evaluation for Botox to right hamstrings, rectus femoris -Continue bilateral upper extremity WHO resting splints along with PROM per nursing staff every 4 hours -Continue low-dose Seroquel for brain injury sequela -Continue scheduled Tylenol for pain along with Crea muscle rub -Continue KREG rotational bed  **PT/OT observed pt with altered cognition on the afternoon of 12/28.  Decision made to decrease scheduled oxycodone to every 12 hours.  Therapy staff concern also could have UTI therefore urinalysis obtained and appears to be consistent with UTI (see below)  Abnormal urinalysis/likely UTI -Mental status changes as documented above on 12/27 -Urinalysis highly abnormal with regimental leukocytes, many bacteria and greater than 50 WBC for UTI -Urine culture obtained -Begin Levaquin daily per tube x5 days and follow-up on urine culture (catheterized specimen)  Acute hypoxemic respiratory failure/new tracheostomy tube requirement/Corynebacterium tracheobronchitis -Continue PMV training per SLP-per SLP did not do well with attempts to introduce oral substances12 /27-#4 cuffless trach in place -Trach team signed off as of 12/27 given not a candidate for decannulation until patient can reliably follow commands, cough and clear airway. -Sputum culture consistent with abundant Corynebacterium-completed linezolid for a total of 7 days (LD due 12/23)-discussed with ID [name] who agrees with this treatment regimen -continue Mucomyst nebs with albuterol nebs -continue physiotherapy with Vibra vest along with supervised PMV trials -Follow-up chest x-ray on 12/24 with  low lung volumes but no acute cardiopulmonary findings  HIV:  -CD4 count of 124 October 2021 (had been as high as 250 Dec 2020)-as of today up to 259 -Dc'd Tivicay and Descovy infavor of Biktarvy to for eventual dc to SNF; per my discussion with infectious disease physician [name] on 12/23 preferred regimen would be Descovy and Tivicay-she will contact the HIV pharmacist to attempt to obtain discount cards to make this a more affordable option.  Once this has been confirmed will transition back to Descovy and Tivicay -CD4 count on 12/23 up to 259 -ID recommends continuing prophylactic Bactrim an additional 3 months  Dysphagia 2/2 anoxic brain injury -Continue tube feeding per PEG -Repeat SLP evaluation on 11/23 with recommendation for n.p.o. status; attempts to introduce oral substances on 12/27 but patient did not accept and was pushing out with his tongue.  Given his acute UTI he may have been nauseated and this may have been reason for him not wishing to eat.  Goals of care:  -Palliative care was involved during this hospitalization.   -Goals of care were discussed. remains full code.  - Palliative care recommended outpatient follow-up with palliative providers. -May need to revisit end-of-life goals of care since patient making poor progress and currently having significant pain related to hypertonicity from brain injury  Protein calorie malnutrition nutrition Status: Nutrition Problem: Increased nutrient needs Etiology: acute illness Signs/Symptoms: estimated needs Interventions: Tube feeding bolus doses of Osmolite, Juven twice daily, Prosource TF 45 mL 3 times daily Estimated body mass index is 26.5 kg/m  as calculated from the following:   Height as of this encounter: 5' 6"  (1.676 m).   Weight as of this encounter: 74.5 kg.   Multiple decubiti not POA Wound / Incision (Open or Dehisced) 11/17/19 Puncture Abdomen Left;Anterior;Upper G-tube insertion site  (Active)  Date First  Assessed/Time First Assessed: 11/17/19 1646   Wound Type: Puncture  Location: Abdomen  Location Orientation: Left;Anterior;Upper  Wound Description (Comments): G-tube insertion site   Present on Admission: No    Assessments 11/17/2019  4:47 PM 01/30/2020  7:12 PM  Dressing Type Gauze (Comment);Tape dressing Gauze (Comment)  Dressing Changed New Changed  Dressing Status Clean;Dry;Intact Intact;Dry;Clean  Dressing Change Frequency PRN --  Site / Wound Assessment Clean;Dry;Pink --  Peri-wound Assessment Intact --  Closure None --  Drainage Amount None --  Treatment Cleansed --     No Linked orders to display      Other problems: Hypertension/grade 1 diastolic dysfunction:  -Continue amlodipine  -Echocardiogram this admission with preserved LVEF with evidence of grade   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   -Continue Keppra; levetiracetam level 35.9 on 11/12 -no further seizure activity since transitioned out of ICU setting therefore will discontinue seizure precaution  Inguinal hernias -Evaluated by general surgery this admission. Documented as chronically incarcerated. Patient has been clinically stable and tolerating tube feedings and having bowel movements so unless patient obstructed would not be considered a candidate for repair. Even if obstructed consulting surgeon was not sure he would be a candidate for hernia repair regardless .   Data Reviewed: Basic Metabolic Panel: Recent Labs  Lab 01/28/20 0200  NA 142  K 4.7  CL 105  CO2 26  GLUCOSE 112*  BUN 21*  CREATININE 0.81  CALCIUM 10.3  MG 1.9   Liver Function Tests: Recent Labs  Lab 01/28/20 0200  AST 32  ALT 27  ALKPHOS 106  BILITOT 0.6  PROT 7.8  ALBUMIN 3.5   No results for input(s): LIPASE, AMYLASE in the last 168 hours. No results for input(s): AMMONIA in the last 168 hours. CBC: Recent Labs  Lab 01/28/20 0200  WBC 4.9  NEUTROABS 2.3  HGB 14.2  HCT 44.8  MCV 95.9   PLT 193   Cardiac Enzymes: No results for input(s): CKTOTAL, CKMB, CKMBINDEX, TROPONINI in the last 168 hours. BNP (last 3 results) No results for input(s): BNP in the last 8760 hours.  ProBNP (last 3 results) No results for input(s): PROBNP in the last 8760 hours.  CBG: Recent Labs  Lab 01/30/20 1626 01/30/20 2007 01/30/20 2352 01/31/20 0420 01/31/20 0817  GLUCAP 88 100* 177* 101* 95    No results found for this or any previous visit (from the past 240 hour(s)).   Studies: No results found.  Scheduled Meds: . acetaminophen (TYLENOL) oral liquid 160 mg/5 mL  650 mg Per Tube Q6H  . amLODipine  10 mg Per Tube Daily  . baclofen  30 mg Per Tube TID  . chlorhexidine gluconate (MEDLINE KIT)  15 mL Mouth Rinse BID  . Darunavir-Cobicisctat-Emtricitabine-Tenofovir Alafenamide  1 tablet Oral Q breakfast  . famotidine  10.4 mg Per Tube BID  . feeding supplement (OSMOLITE 1.5 CAL)  237 mL Per Tube Q24H  . feeding supplement (OSMOLITE 1.5 CAL)  474 mL Per Tube TID  . feeding supplement (PROSource TF)  45 mL Per Tube TID  . fiber  1 packet Per Tube BID  . folic acid  1 mg Per Tube Daily  .  free water  200 mL Per Tube Q4H  . gabapentin  200 mg Per Tube Q8H  . heparin injection (subcutaneous)  5,000 Units Subcutaneous Q8H  . levETIRAcetam  1,500 mg Per Tube BID  . levofloxacin  500 mg Per Tube Daily  . Muscle Rub   Topical QID  . oxyCODONE  5 mg Per Tube Q12H  . QUEtiapine  12.5 mg Per Tube QHS  . scopolamine  1 patch Transdermal Q72H  . sulfamethoxazole-trimethoprim  1 tablet Per Tube Once per day on Mon Wed Fri  . thiamine  100 mg Per Tube Daily  . tiZANidine  4 mg Per Tube QID   Continuous Infusions:   Active Problems:   Cardiac arrest [facility])   Community acquired pneumonia of left upper lobe of lung   Anoxic brain injury ([place])   Alcohol abuse   Acute respiratory failure with hypoxia ([facility])   Vomiting   Pressure injury of skin   Small bowel obstruction ([place])    Palliative care encounter   Bowel obstruction ([facility])   Chronic respiratory failure ([facility])   Diastolic dysfunction   Tracheostomy dependent ([place])   Dysphagia   Protein-calorie malnutrition ([place])   Seizures ([place])   Bilateral recurrent inguinal hernia   Muscle spasticity   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[place])   Tracheobronchitis   Sequela of Corynebacterium infection   Consultants:  PCCM  Palliative medicine  General surgery  Neurology  Interventional radiology  Procedures:  EEG  Echocardiogram  Tracheostomy  Antibiotics: Anti-infectives (From admission, onward)   Start     Dose/Rate Route Frequency Ordered Stop   01/31/20 1015  levofloxacin (LEVAQUIN) 25 MG/ML solution 500 mg        500 mg Per Tube Daily 01/31/20 0919 02/05/20 0959   01/31/20 1000  cefTRIAXone (ROCEPHIN) 1 g in sodium chloride 0.9 % 100 mL IVPB  Status:  Discontinued        1 g 200 mL/hr over 30 Minutes Intravenous Every 24 hours 01/31/20 0905 01/31/20 0919   01/19/20 2200  linezolid (ZYVOX) tablet 600 mg        600 mg Per Tube Every 12 hours 01/19/20 1534 01/25/20 2019   01/19/20 1100  levofloxacin (LEVAQUIN) tablet 500 mg  Status:  Discontinued        500 mg Per Tube Daily 01/19/20 1004 01/19/20 1534   12/19/19 0930  Darunavir-Cobicisctat-Emtricitabine-Tenofovir Alafenamide (SYMTUZA) 800-150-200-10 MG TABS 1 tablet        1 tablet Oral Daily with breakfast 12/19/19 0840     12/13/19 1000  bictegravir-emtricitabine-tenofovir AF (BIKTARVY) 50-200-25 MG per tablet 1 tablet  Status:  Discontinued        1 tablet Oral Daily 12/12/19 1413 12/19/19 0840   11/17/19 1548  ceFAZolin (ANCEF) 2-4 GM/100ML-% IVPB       Note to Pharmacy: Domenick Bookbinder   : cabinet override      11/17/19 1548 11/18/19 0359   11/16/19 1515  ceFAZolin (ANCEF) IVPB 2g/100 mL premix        2 g 200 mL/hr over 30 Minutes Intravenous To Radiology 11/16/19 1511 11/17/19 1625   11/15/19 0900  sulfamethoxazole-trimethoprim  (BACTRIM DS) 800-160 MG per tablet 1 tablet        1 tablet Per Tube Once per day on Mon Wed Fri 11/13/19 1906     11/13/19 1615  dolutegravir (TIVICAY) tablet 50 mg  Status:  Discontinued        50 mg Oral Daily 11/13/19 1521 12/12/19  1413   11/13/19 1615  emtricitabine-tenofovir AF (DESCOVY) 200-25 MG per tablet 1 tablet  Status:  Discontinued        1 tablet Per Tube Daily 11/13/19 1521 12/12/19 1413   11/13/19 1530  sulfamethoxazole-trimethoprim (BACTRIM DS) 800-160 MG per tablet 1 tablet  Status:  Discontinued        1 tablet Oral Once per day on Mon Wed Fri 11/13/19 1441 11/13/19 1906   11/13/19 1500  bictegravir-emtricitabine-tenofovir AF (BIKTARVY) 50-200-25 MG per tablet 1 tablet  Status:  Discontinued        1 tablet Oral Daily 11/13/19 1403 11/13/19 1521   11/10/19 1000  erythromycin 250 mg in sodium chloride 0.9 % 100 mL IVPB        250 mg 100 mL/hr over 60 Minutes Intravenous Every 8 hours 11/10/19 0907 11/11/19 2000   11/07/19 1200  ceFEPIme (MAXIPIME) 2 g in sodium chloride 0.9 % 100 mL IVPB        2 g 200 mL/hr over 30 Minutes Intravenous Every 8 hours 11/07/19 1013 11/13/19 2127   11/03/19 1200  cefTRIAXone (ROCEPHIN) 2 g in sodium chloride 0.9 % 100 mL IVPB        2 g 200 mL/hr over 30 Minutes Intravenous Every 24 hours 11/03/19 0922 11/05/19 1427   11/02/19 0800  vancomycin (VANCOREADY) IVPB 750 mg/150 mL  Status:  Discontinued        750 mg 150 mL/hr over 60 Minutes Intravenous Every 12 hours 11/01/19 1935 11/02/19 1105   11/02/19 0600  piperacillin-tazobactam (ZOSYN) IVPB 3.375 g  Status:  Discontinued        3.375 g 12.5 mL/hr over 240 Minutes Intravenous Every 8 hours 11/02/19 0311 11/03/19 0920   11/01/19 2200  ceFEPIme (MAXIPIME) 2 g in sodium chloride 0.9 % 100 mL IVPB  Status:  Discontinued        2 g 200 mL/hr over 30 Minutes Intravenous Every 12 hours 11/01/19 2143 11/02/19 0311   11/01/19 1915  vancomycin (VANCOREADY) IVPB 1500 mg/300 mL        1,500 mg 150  mL/hr over 120 Minutes Intravenous  Once 11/01/19 1909 11/01/19 2147   11/01/19 1845  cefTRIAXone (ROCEPHIN) 1 g in sodium chloride 0.9 % 100 mL IVPB        1 g 200 mL/hr over 30 Minutes Intravenous  Once 11/01/19 1842 11/01/19 2051   11/01/19 1845  azithromycin (ZITHROMAX) 500 mg in sodium chloride 0.9 % 250 mL IVPB        500 mg 250 mL/hr over 60 Minutes Intravenous  Once 11/01/19 1842 11/01/19 2051       Time spent: 20 minutes    Erin Hearing ANP  [facility]  7 am-330 pm/M-F 01/31/2020, 10:19 AM  LOS: 91 days

## 2020-01-31 NOTE — Progress Notes (Signed)
Nutrition Follow-up  DOCUMENTATION CODES:   Not applicable  INTERVENTION:   Continue bolus TF regimen via PEG: -Provide 2 cartons (432m) Osmolite 1.5 TID @ 0800, 1200, 1700 -Provide 1 carton (2343m Osmolite 1.5 once daily at bedtime @ 2100 -Flush with 6022mree water before and after each tube feeding bolus -Provide 15m67mosource TF TID -Provide Nutrisource fiber BID -Additional free water flushes currently 200ml67m  Tube feeding regimen provides 2605 kcals, 137 grams protein,1267ml 26m water (2947ml w23mflushes)  NUTRITION DIAGNOSIS:   Increased nutrient needs related to acute illness as evidenced by estimated needs. 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 PMH includes HIV and EtOH use.  09/29 - intubated 10/07- vomiting, TF held, laterrestarted with recurrent vomiting 10/12-R/L inguinal hernia, partial SBO  10/13- trach 10/15-PEG 10/16 - tolerated TC for 9 hours 10/18->10/19 - off vent overnight 10/20- TC 11/15 - trach downsized to #4 Shiley flex CFS 12/06 - trach changed (same #4 Shiley flex CFS)  Lethargic upon RD follow up. Noted new UTI. Did not do well with PO intake attempts on 12/27 per SLP (pushing food out with tongue). Remains NPO. Tolerating bolus feedings per RN. Continue with current regimen. Follow for diet advancement.   Admission weight: 78.9 kg Current weight: 74.5 kg (taken 12/26)  UOP: 550 ml x 24 hrs   Medications: nutrisource fiber BID, folic acid, thiamine Labs: CBG 88-177  Diet Order:   Diet Order             Diet NPO time specified  Diet effective midnight                 EDUCATION NEEDS:   Not appropriate for education at this time  Skin:  Skin Assessment: Skin Integrity Issues: Skin Integrity Issues:: Other (Comment) Stage II: N/A Stage III: N/A Other: puncture abdomen (PEG insertion site)  Last BM:  12/28  Height:   Ht Readings from Last 1 Encounters:  11/01/19 _0  (1.676 m)    Weight:   Wt Readings from Last 1 Encounters:  01/28/20 74.5 kg    BMI:  Body mass index is 26.5 kg/m.  Estimated Nutritional Needs:   Kcal:  2400-2600  Protein:  120-135 grams  Fluid:  >2L/d  Demerius Podolak MMariana SingleN Clinical Nutrition Pager listed in [facility]

## 2020-02-01 LAB — GLUCOSE, CAPILLARY
Glucose-Capillary: 103 mg/dL — ABNORMAL HIGH (ref 70–99)
Glucose-Capillary: 108 mg/dL — ABNORMAL HIGH (ref 70–99)
Glucose-Capillary: 110 mg/dL — ABNORMAL HIGH (ref 70–99)
Glucose-Capillary: 116 mg/dL — ABNORMAL HIGH (ref 70–99)
Glucose-Capillary: 136 mg/dL — ABNORMAL HIGH (ref 70–99)
Glucose-Capillary: 165 mg/dL — ABNORMAL HIGH (ref 70–99)
Glucose-Capillary: 87 mg/dL (ref 70–99)

## 2020-02-01 MED ORDER — OXYCODONE HCL 5 MG/5ML PO SOLN
5.0000 mg | Freq: Three times a day (TID) | ORAL | Status: DC
Start: 2020-02-01 — End: 2020-02-23
  Administered 2020-02-01 – 2020-02-23 (×66): 5 mg
  Filled 2020-02-01 (×66): qty 5

## 2020-02-01 MED ORDER — AMOXICILLIN-POT CLAVULANATE 250-62.5 MG/5ML PO SUSR
500.0000 mg | Freq: Three times a day (TID) | ORAL | Status: AC
  Administered 2020-02-01 – 2020-02-06 (×15): 500 mg
  Filled 2020-02-01 (×16): qty 10

## 2020-02-01 MED ORDER — MORPHINE SULFATE (PF) 2 MG/ML IV SOLN
2.0000 mg | Freq: Once | INTRAVENOUS | Status: AC
  Administered 2020-02-01: 2 mg via INTRAMUSCULAR
  Filled 2020-02-01: qty 1

## 2020-02-01 NOTE — Progress Notes (Addendum)
[facility] PROGRESS NOTE  Koty [place] IRJ:[id number] DOB: 1965/05/01 DOA: 11/01/2019 PCP: Default, Provider, MD    11/16   Status: Remains inpatient appropriate because:Altered mental status, Unsafe d/c plan and Inpatient level of care appropriate due to severity of illness. Patient newly diagnosed with anoxic brain injury  Dispo: The patient is from: Home              Anticipated d/c is to: SNF              Anticipated d/c date is: > 3 days              Patient currently is medically stable to d/c.  Barriers to discharge: No SNF bed offers.  Needs trach capable facility. TOC communicating with financial counseling team who have yet to hear back from DSS regarding patient's assigned caseworker.   Code Status: Full Family Communication: 12/29 dtr [name] updated DVT prophylaxis: Subcutaneous heparin Vaccination status: Covid vaccination status unknown  Foley catheter: No  HPI: 54 year old male with HIV, chronic alcohol abuse who was presented to the emergency department after being found unresponsive outside a convinient store.  EMS found him pulseless in PEA, unknown downtime, successfully resuscitated and brought to the emergency department.  UDS positive for THC and benzodiazepines.  CT head unremarkable.  Failed extubation trials due to severe anoxic brain injury and had tracheostomy placed.  PEG placed on 10/15.  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  This has led to significant pain as well.  [name] was consulted and recommendations/input/orders highly appreciated.  She may also benefit from Botox injections to both hips to aid in treatment of spasticity.   Significant Hospital Events   9/29 Admit post PEA arrest. UDS positive for THC, benzo's. ETOH 177 10/05 EEG ongoing. Versed restarted overnight due to agitation. Nicardipine increased.  10/06 Developed tachypnea with WUA, no  follow commands off sedation  10/07 Vomiting, TF held / restarted with recurrent vomiting 10/16 tracheostomy collar for 9 hours 10/18->10/19 off vent all night  10/20> TC 11/15 > downsized to #4 Shiley flex CFS  Subjective: More lethargic than baseline today and not smiling as much.  Appears to be uncomfortable and in pain.  Objective: Vitals:   01/31/20 2215 02/01/20 0825  BP: 113/72 129/82  Pulse: (!) 102 91  Resp: (!) 21 20  Temp: 98.3 F (36.8 C) 98.1 F (36.7 C)  SpO2: 92%    No intake or output data in the 24 hours ending 02/01/20 1106 Filed Weights   01/22/20 0412 01/23/20 0700 01/28/20 0500  Weight: 72 kg 74.1 kg 74.5 kg    Exam: General: Awake and remains less interactive than baseline Pulmonary: Midline tracheostomy tube stable with minimal drainage.  Trach collar in place with FiO2 28%.  Saturations between 96 and 100% no increased work of breathing.  Lung sounds coarse but clear to auscultation and decreased in the bases Cardiac: Pulse is regular and nontachycardic, heart sounds S1-S2, BP stable Abdomen: Bowel sounds present, abdomen soft and nontender including over suprapubic region, PEG tube for tube feedings with site unremarkable. LBM gait Neurological: More lethargic than baseline, does awaken and open eyes and responds verbally when spoken to as documented above.  At baseline has dystonic movements involving the upper extremities but demonstrates purposeful movement more so with left upper extremity movement is gross motor in etiology and originates from the shoulders.  Marked decreased tonicity and spasticity and left lower  extremity.  Continues with significant hypertonicity and flexion of RLE at knee and hip.  Assessment/Plan: Acute problems: Anoxic brain injury 2/2 PEA arrest/pain syndrome secondary to hypertonicity/spastic tetraplegia:  -Presumed 2/2 combination alcohol and BZD overdose with UDS positive for THC and BZDs -Significant brain injury with  underlying spastic tetraplegia long-term SNF placement recommended-continue PT/OT/SLP -Continue baclofen, gabapentin, scheduled oxycodone and Zanaflex 4 mg QID -Appreciate assistance of Dr. Swartz-12/27 recontacted [name] regarding consideration/evaluation for Botox to right hamstrings, rectus femoris -Continue bilateral upper extremity WHO resting splints along with PROM per nursing staff every 4 hours -Continue low-dose Seroquel for brain injury sequela -Continue scheduled Tylenol for pain along with Crea muscle rub -Continue KREG rotational bed  -12/30 patient appears to be more pain after decreasing frequency of oxycodone therefore will increase frequency back to every 8 hours and give morphine 2 mg IM x1  Abnormal urinalysis/likely UTI -Mental status changes as documented above on 12/27 did have T-max of 99.9 on 12/28 despite receiving scheduled Tylenol for pain -Urinalysis highly abnormal with regimental leukocytes, many bacteria and greater than 50 WBC for UTI -Urine culture obtained (catheterized specimen)-preliminary urine culture positive for 80,000 colonies of Enterococcus faecalis therefore will DC Levaquin in favor of Augmentin  Acute hypoxemic respiratory failure/new tracheostomy tube requirement/Corynebacterium tracheobronchitis -Continue PMV training per SLP-per SLP did not do well with attempts to introduce oral substances12 /27-#4 cuffless trach in place -Trach team signed off as of 12/27 given not a candidate for decannulation until patient can reliably follow commands, cough and clear airway. -Sputum culture consistent with abundant Corynebacterium-completed linezolid for a total of 7 days (LD due 12/23)-discussed with ID [name] who agrees with this treatment regimen -continue Mucomyst nebs with albuterol nebs -continue physiotherapy with Vibra vest along with supervised PMV trials -Follow-up chest x-ray on 12/24 with low lung volumes but no acute cardiopulmonary  findings  HIV:  -CD4 count of 124 October 2021 (had been as high as 250 Dec 2020)-as of today up to 259 -Dc'd Tivicay and Descovy infavor of Biktarvy to for eventual dc to SNF; per my discussion with infectious disease physician [name] on 12/23 preferred regimen would be Descovy and Tivicay-she will contact the HIV pharmacist to attempt to obtain discount cards to make this a more affordable option.  Once this has been confirmed will transition back to Descovy and Tivicay -CD4 count on 12/23 up to 259 -ID recommends continuing prophylactic Bactrim an additional 3 months  Dysphagia 2/2 anoxic brain injury -Continue tube feeding per PEG -Repeat SLP evaluation on 11/23 with recommendation for n.p.o. status; attempts to introduce oral substances on 12/27 but patient did not accept and was pushing out with his tongue.  Given his acute UTI he may have been nauseated and this may have been reason for him not wishing to eat.  Goals of care:  -Palliative care was involved during this hospitalization.   -Goals of care were discussed. remains full code.  - Palliative care recommended outpatient follow-up with palliative providers. -May need to revisit end-of-life goals of care since patient making poor progress and currently having significant pain related to hypertonicity from brain injury  Protein calorie malnutrition nutrition Status: Nutrition Problem: Increased nutrient needs Etiology: acute illness Signs/Symptoms: estimated needs Interventions: Tube feeding bolus doses of Osmolite, Juven twice daily, Prosource TF 45 mL 3 times daily Estimated body mass index is 26.5 kg/m as calculated from the following:   Height as of this encounter: 5' 6"  (1.676 m).   Weight  as of this encounter: 74.5 kg.   Multiple decubiti not POA Wound / Incision (Open or Dehisced) 11/17/19 Puncture Abdomen Left;Anterior;Upper G-tube insertion site  (Active)  Date First Assessed/Time First Assessed: 11/17/19 1646    Wound Type: Puncture  Location: Abdomen  Location Orientation: Left;Anterior;Upper  Wound Description (Comments): G-tube insertion site   Present on Admission: No    Assessments 11/17/2019  4:47 PM 01/31/2020  7:44 PM  Dressing Type Gauze (Comment);Tape dressing Gauze (Comment)  Dressing Changed New Changed  Dressing Status Clean;Dry;Intact Clean;Dry;Intact  Dressing Change Frequency PRN --  Site / Wound Assessment Clean;Dry;Pink --  Peri-wound Assessment Intact --  Closure None None  Drainage Amount None None  Treatment Cleansed --     No Linked orders to display      Other problems: Hypertension/grade 1 diastolic dysfunction:  -Continue amlodipine  -Echocardiogram this admission with preserved LVEF with evidence of grade   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   -Continue Keppra; levetiracetam level 35.9 on 11/12 -no further seizure activity since transitioned out of ICU setting therefore will discontinue seizure precaution  Inguinal hernias -Evaluated by general surgery this admission. Documented as chronically incarcerated. Patient has been clinically stable and tolerating tube feedings and having bowel movements so unless patient obstructed would not be considered a candidate for repair. Even if obstructed consulting surgeon was not sure he would be a candidate for hernia repair regardless .   Data Reviewed: Basic Metabolic Panel: Recent Labs  Lab 01/28/20 0200  NA 142  K 4.7  CL 105  CO2 26  GLUCOSE 112*  BUN 21*  CREATININE 0.81  CALCIUM 10.3  MG 1.9   Liver Function Tests: Recent Labs  Lab 01/28/20 0200  AST 32  ALT 27  ALKPHOS 106  BILITOT 0.6  PROT 7.8  ALBUMIN 3.5   No results for input(s): LIPASE, AMYLASE in the last 168 hours. No results for input(s): AMMONIA in the last 168 hours. CBC: Recent Labs  Lab 01/28/20 0200  WBC 4.9  NEUTROABS 2.3  HGB 14.2  HCT 44.8  MCV 95.9  PLT 193   Cardiac Enzymes: No results for  input(s): CKTOTAL, CKMB, CKMBINDEX, TROPONINI in the last 168 hours. BNP (last 3 results) No results for input(s): BNP in the last 8760 hours.  ProBNP (last 3 results) No results for input(s): PROBNP in the last 8760 hours.  CBG: Recent Labs  Lab 01/31/20 1707 01/31/20 1954 02/01/20 0030 02/01/20 0604 02/01/20 0751  GLUCAP 140* 151* 103* 108* 87    Recent Results (from the past 240 hour(s))  Urine Culture     Status: None (Preliminary result)   Collection Time: 01/30/20  5:50 PM   Specimen: Urine, Clean Catch  Result Value Ref Range Status   Specimen Description URINE, CLEAN CATCH  Final   Special Requests Normal  Final   Culture   Final    CULTURE REINCUBATED FOR BETTER GROWTH Performed at [facility], [place] [address] [place], [place] [address]    Report Status PENDING  Incomplete     Studies: No results found.  Scheduled Meds: . acetaminophen (TYLENOL) oral liquid 160 mg/5 mL  650 mg Per Tube Q6H  . amLODipine  10 mg Per Tube Daily  . baclofen  30 mg Per Tube TID  . chlorhexidine gluconate (MEDLINE KIT)  15 mL Mouth Rinse BID  . Darunavir-Cobicisctat-Emtricitabine-Tenofovir Alafenamide  1 tablet Oral Q breakfast  . famotidine  10.4 mg Per Tube BID  .  feeding supplement (OSMOLITE 1.5 CAL)  237 mL Per Tube Q24H  . feeding supplement (OSMOLITE 1.5 CAL)  474 mL Per Tube TID  . feeding supplement (PROSource TF)  45 mL Per Tube TID  . fiber  1 packet Per Tube BID  . folic acid  1 mg Per Tube Daily  . free water  200 mL Per Tube Q4H  . gabapentin  200 mg Per Tube Q8H  . heparin injection (subcutaneous)  5,000 Units Subcutaneous Q8H  . levETIRAcetam  1,500 mg Per Tube BID  . levofloxacin  500 mg Per Tube Daily  .  morphine injection  2 mg Intramuscular Once  . Muscle Rub   Topical QID  . oxyCODONE  5 mg Per Tube Q8H  . QUEtiapine  12.5 mg Per Tube QHS  . scopolamine  1 patch Transdermal Q72H  . sulfamethoxazole-trimethoprim  1 tablet Per Tube Once per day on  Mon Wed Fri  . thiamine  100 mg Per Tube Daily  . tiZANidine  4 mg Per Tube QID   Continuous Infusions:   Active Problems:   Cardiac arrest [facility])   Community acquired pneumonia of left upper lobe of lung   Anoxic brain injury ([place])   Alcohol abuse   Acute respiratory failure with hypoxia ([facility])   Vomiting   Pressure injury of skin   Small bowel obstruction ([place])   Palliative care encounter   Bowel obstruction ([facility])   Chronic respiratory failure ([facility])   Diastolic dysfunction   Tracheostomy dependent ([place])   Dysphagia   Protein-calorie malnutrition ([place])   Seizures ([place])   Bilateral recurrent inguinal hernia   Muscle spasticity   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H[facility])   Tracheobronchitis   Sequela of Corynebacterium infection   Abnormal urinalysis   Consultants:  PCCM  Palliative medicine  General surgery  Neurology  Interventional radiology  Procedures:  EEG  Echocardiogram  Tracheostomy  Antibiotics: Anti-infectives (From admission, onward)   Start     Dose/Rate Route Frequency Ordered Stop   01/31/20 1015  levofloxacin (LEVAQUIN) 25 MG/ML solution 500 mg        500 mg Per Tube Daily 01/31/20 0919 02/05/20 0959   01/31/20 1000  cefTRIAXone (ROCEPHIN) 1 g in sodium chloride 0.9 % 100 mL IVPB  Status:  Discontinued        1 g 200 mL/hr over 30 Minutes Intravenous Every 24 hours 01/31/20 0905 01/31/20 0919   01/19/20 2200  linezolid (ZYVOX) tablet 600 mg        600 mg Per Tube Every 12 hours 01/19/20 1534 01/25/20 2019   01/19/20 1100  levofloxacin (LEVAQUIN) tablet 500 mg  Status:  Discontinued        500 mg Per Tube Daily 01/19/20 1004 01/19/20 1534   12/19/19 0930  Darunavir-Cobicisctat-Emtricitabine-Tenofovir Alafenamide (SYMTUZA) 800-150-200-10 MG TABS 1 tablet        1 tablet Oral Daily with breakfast 12/19/19 0840     12/13/19 1000  bictegravir-emtricitabine-tenofovir AF (BIKTARVY) 50-200-25 MG per tablet 1 tablet  Status:  Discontinued         1 tablet Oral Daily 12/12/19 1413 12/19/19 0840   11/17/19 1548  ceFAZolin (ANCEF) 2-4 GM/100ML-% IVPB       Note to Pharmacy: Domenick Bookbinder   : cabinet override      11/17/19 1548 11/18/19 0359   11/16/19 1515  ceFAZolin (ANCEF) IVPB 2g/100 mL premix        2 g 200 mL/hr over 30 Minutes  Intravenous To Radiology 11/16/19 1511 11/17/19 1625   11/15/19 0900  sulfamethoxazole-trimethoprim (BACTRIM DS) 800-160 MG per tablet 1 tablet        1 tablet Per Tube Once per day on Mon Wed Fri 11/13/19 1906     11/13/19 1615  dolutegravir (TIVICAY) tablet 50 mg  Status:  Discontinued        50 mg Oral Daily 11/13/19 1521 12/12/19 1413   11/13/19 1615  emtricitabine-tenofovir AF (DESCOVY) 200-25 MG per tablet 1 tablet  Status:  Discontinued        1 tablet Per Tube Daily 11/13/19 1521 12/12/19 1413   11/13/19 1530  sulfamethoxazole-trimethoprim (BACTRIM DS) 800-160 MG per tablet 1 tablet  Status:  Discontinued        1 tablet Oral Once per day on Mon Wed Fri 11/13/19 1441 11/13/19 1906   11/13/19 1500  bictegravir-emtricitabine-tenofovir AF (BIKTARVY) 50-200-25 MG per tablet 1 tablet  Status:  Discontinued        1 tablet Oral Daily 11/13/19 1403 11/13/19 1521   11/10/19 1000  erythromycin 250 mg in sodium chloride 0.9 % 100 mL IVPB        250 mg 100 mL/hr over 60 Minutes Intravenous Every 8 hours 11/10/19 0907 11/11/19 2000   11/07/19 1200  ceFEPIme (MAXIPIME) 2 g in sodium chloride 0.9 % 100 mL IVPB        2 g 200 mL/hr over 30 Minutes Intravenous Every 8 hours 11/07/19 1013 11/13/19 2127   11/03/19 1200  cefTRIAXone (ROCEPHIN) 2 g in sodium chloride 0.9 % 100 mL IVPB        2 g 200 mL/hr over 30 Minutes Intravenous Every 24 hours 11/03/19 0922 11/05/19 1427   11/02/19 0800  vancomycin (VANCOREADY) IVPB 750 mg/150 mL  Status:  Discontinued        750 mg 150 mL/hr over 60 Minutes Intravenous Every 12 hours 11/01/19 1935 11/02/19 1105   11/02/19 0600  piperacillin-tazobactam (ZOSYN) IVPB 3.375  g  Status:  Discontinued        3.375 g 12.5 mL/hr over 240 Minutes Intravenous Every 8 hours 11/02/19 0311 11/03/19 0920   11/01/19 2200  ceFEPIme (MAXIPIME) 2 g in sodium chloride 0.9 % 100 mL IVPB  Status:  Discontinued        2 g 200 mL/hr over 30 Minutes Intravenous Every 12 hours 11/01/19 2143 11/02/19 0311   11/01/19 1915  vancomycin (VANCOREADY) IVPB 1500 mg/300 mL        1,500 mg 150 mL/hr over 120 Minutes Intravenous  Once 11/01/19 1909 11/01/19 2147   11/01/19 1845  cefTRIAXone (ROCEPHIN) 1 g in sodium chloride 0.9 % 100 mL IVPB        1 g 200 mL/hr over 30 Minutes Intravenous  Once 11/01/19 1842 11/01/19 2051   11/01/19 1845  azithromycin (ZITHROMAX) 500 mg in sodium chloride 0.9 % 250 mL IVPB        500 mg 250 mL/hr over 60 Minutes Intravenous  Once 11/01/19 1842 11/01/19 2051       Time spent: 20 minutes    Erin Hearing ANP  [facility]  7 am-330 pm/M-F 02/01/2020, 11:06 AM  LOS: 92 days

## 2020-02-02 DIAGNOSIS — B952 Enterococcus as the cause of diseases classified elsewhere: Secondary | ICD-10-CM

## 2020-02-02 DIAGNOSIS — N390 Urinary tract infection, site not specified: Secondary | ICD-10-CM

## 2020-02-02 LAB — URINE CULTURE
Culture: 80000 — AB
Special Requests: NORMAL

## 2020-02-02 LAB — GLUCOSE, CAPILLARY
Glucose-Capillary: 100 mg/dL — ABNORMAL HIGH (ref 70–99)
Glucose-Capillary: 125 mg/dL — ABNORMAL HIGH (ref 70–99)
Glucose-Capillary: 143 mg/dL — ABNORMAL HIGH (ref 70–99)
Glucose-Capillary: 149 mg/dL — ABNORMAL HIGH (ref 70–99)
Glucose-Capillary: 97 mg/dL (ref 70–99)
Glucose-Capillary: 97 mg/dL (ref 70–99)

## 2020-02-02 NOTE — Progress Notes (Signed)
[facility] PROGRESS NOTE  [place] WVP:[id number] DOB: 09/27/1965 DOA: 11/01/2019 PCP: Default, Provider, MD    11/16   Status: Remains inpatient appropriate because:Altered mental status, Unsafe d/c plan and Inpatient level of care appropriate due to severity of illness. Patient newly diagnosed with anoxic brain injury  Dispo: The patient is from: Home              Anticipated d/c is to: SNF              Anticipated d/c date is: > 3 days              Patient currently is medically stable to d/c.  Barriers to discharge: No SNF bed offers.  Needs trach capable facility. TOC communicating with financial counseling team who have yet to hear back from DSS regarding patient's assigned caseworker.   Code Status: Full Family Communication: 12/29 dtr [name] updated DVT prophylaxis: Subcutaneous heparin Vaccination status: Covid vaccination status unknown  Foley catheter: No  HPI: 54 year old male with HIV, chronic alcohol abuse who was presented to the emergency department after being found unresponsive outside a convinient store.  EMS found him pulseless in PEA, unknown downtime, successfully resuscitated and brought to the emergency department.  UDS positive for THC and benzodiazepines.  CT head unremarkable.  Failed extubation trials due to severe anoxic brain injury and had tracheostomy placed.  PEG placed on 10/15.  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  This has led to significant pain as well.  [name] was consulted and recommendations/input/orders highly appreciated.  She may also benefit from Botox injections to both hips to aid in treatment of spasticity.   Significant Hospital Events   9/29 Admit post PEA arrest. UDS positive for THC, benzo's. ETOH 177 10/05 EEG ongoing. Versed restarted overnight due to agitation. Nicardipine increased.  10/06 Developed tachypnea with WUA, no  follow commands off sedation  10/07 Vomiting, TF held / restarted with recurrent vomiting 10/16 tracheostomy collar for 9 hours 10/18->10/19 off vent all night  10/20> TC 11/15 > downsized to #4 Shiley flex CFS  Subjective: Awake and back to baseline.  Was somewhat restless until head of bed elevated.  Appears to be trying to get out of bed and is very interested in activity outside in the hallway.  Objective: Vitals:   02/02/20 0449 02/02/20 0820  BP: 101/76   Pulse: 88 (!) 104  Resp: 20 18  Temp: 98.8 F (37.1 C)   SpO2: 96% 95%    Intake/Output Summary (Last 24 hours) at 02/02/2020 1035 Last data filed at 02/02/2020 0500 Gross per 24 hour  Intake --  Output 550 ml  Net -550 ml   Filed Weights   01/23/20 0700 01/28/20 0500 02/02/20 0954  Weight: 74.1 kg 74.5 kg 71.6 kg    Exam: General: Awake and alert today, restless but restlessness improved with elevation of head of bed Pulmonary: Lateral lung sounds are clear to auscultation somewhat coarse and diminished in the bases.  Trach midline with minimal secretions.  Trach collar in place with FiO2 21% and pulse oximetry ranging between 92 and 96% Cardiac: Heart sounds normal S1-S2, no peripheral edema, no tachycardia Abdomen: Abdomen soft nondistended and nontender with normoactive bowel sounds.  PEG tube in place with tube feeding infusing LBM 12/29 Neurological: Very alert and active this morning.  Attempts to phonate but most speech is unintelligible but clearly states" high" he wants head of bed  elevated.  Dystonic movements involving the upper extremities w/ purposeful movement more so with left upper extremity movement is gross motor in etiology and originates from the shoulders.  Marked decreased tonicity and spasticity and left lower extremity.  Continues with significant hypertonicity and flexion of RLE at knee and hip.  Assessment/Plan: Acute problems: Anoxic brain injury 2/2 PEA arrest/pain syndrome secondary to  hypertonicity/spastic tetraplegia:  -Presumed 2/2 combination alcohol and BZD overdose with UDS positive for THC and BZDs -Significant brain injury with underlying spastic tetraplegia long-term SNF placement recommended-continue PT/OT/SLP -Continue baclofen, gabapentin, scheduled oxycodone and Zanaflex  -Appreciate assistance of Dr. Swartz-12/27 recontacted via secure chat [name] regarding consideration/evaluation for Botox to right hamstrings, rectus femoris -Continue bilateral upper extremity WHO resting splints along with PROM per nursing staff every 4 hours -Continue low-dose Seroquel for brain injury sequela -Continue scheduled Tylenol for pain along with Crea muscle rub -Continue KREG rotational bed   Enterococcus faecalis UTI -Mental status changes as documented above on 12/27 did have T-max of 99.9 on 12/28 despite receiving scheduled Tylenol for pain -Urinalysis highly abnormal with regimental leukocytes, many bacteria and greater than 50 WBC for UTI -Urine culture positive for pansensitive Enterococcus-empiric Levaquin discontinued on 12/30 in favor of Augmentin and will receive a total of 5 days therapy  Acute hypoxemic respiratory failure/new tracheostomy tube requirement/Corynebacterium tracheobronchitis -Continue PMV training per SLP-per SLP did not do well with attempts to introduce oral substances12 /2;  #4 cuffless trach in place -Trach team signed off as of 12/27 given not a candidate for decannulation until patient can reliably follow commands, cough and clear airway. -Completed 7 days of linezolid for Corynebacterium tracheobronchitis -continue albuterol nebs -continue physiotherapy with Vibra vest along with supervised PMV trials -Follow-up chest x-ray on 12/24 with low lung volumes but no acute cardiopulmonary findings  HIV:  -CD4 count of 124 October 2021 (had been as high as 250 Dec 2020)-as of today up to 259 -Dc'd Tivicay and Descovy infavor of Biktarvy to for  eventual dc to SNF; per my discussion with infectious disease physician [name] on 12/23 preferred regimen would be Descovy and Tivicay-she will contact the HIV pharmacist to attempt to obtain discount cards to make this a more affordable option.  Once this has been confirmed will transition back to Descovy and Tivicay -CD4 count on 12/23 up to 259 -ID recommends continuing prophylactic Bactrim an additional 3 months  Dysphagia 2/2 anoxic brain injury -Continue tube feeding per PEG -Repeat SLP evaluation on 11/23 with recommendation for n.p.o. status; attempts to introduce oral substances on 12/27 but patient did not accept and was pushing out with his tongue.  Given his acute UTI he may have been nauseated and this may have been reason for him not wishing to eat.  Goals of care:  -Palliative care was involved during this hospitalization.   -Goals of care were discussed. remains full code.  - Palliative care recommended outpatient follow-up with palliative providers. -May need to revisit end-of-life goals of care since patient making poor progress and currently having significant pain related to hypertonicity from brain injury  Protein calorie malnutrition nutrition Status: Nutrition Problem: Increased nutrient needs Etiology: acute illness Signs/Symptoms: estimated needs Interventions: Tube feeding bolus doses of Osmolite, Juven twice daily, Prosource TF 45 mL 3 times daily Estimated body mass index is 25.47 kg/m as calculated from the following:   Height as of this encounter: _0  (1.676 m).   Weight as of this encounter: 71.6 kg.  Multiple decubiti not POA Wound / Incision (Open or Dehisced) 11/17/19 Puncture Abdomen Left;Anterior;Upper G-tube insertion site  (Active)  Date First Assessed/Time First Assessed: 11/17/19 1646   Wound Type: Puncture  Location: Abdomen  Location Orientation: Left;Anterior;Upper  Wound Description (Comments): G-tube insertion site   Present on Admission:  No    Assessments 11/17/2019  4:47 PM 02/01/2020  7:34 PM  Dressing Type Gauze (Comment);Tape dressing Gauze (Comment)  Dressing Changed New --  Dressing Status Clean;Dry;Intact --  Dressing Change Frequency PRN --  Site / Wound Assessment Clean;Dry;Pink --  Peri-wound Assessment Intact --  Closure None None  Drainage Amount None None  Treatment Cleansed --     No Linked orders to display      Other problems: Hypertension/grade 1 diastolic dysfunction:  -Continue amlodipine  -Echocardiogram this admission with preserved LVEF with evidence of grade   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   -Continue Keppra; levetiracetam level 35.9 on 11/12 -no further seizure activity since transitioned out of ICU setting therefore will discontinue seizure precaution  Inguinal hernias -Evaluated by general surgery this admission. Documented as chronically incarcerated. Patient has been clinically stable and tolerating tube feedings and having bowel movements so unless patient obstructed would not be considered a candidate for repair. Even if obstructed consulting surgeon was not sure he would be a candidate for hernia repair regardless .   Data Reviewed: Basic Metabolic Panel: Recent Labs  Lab 01/28/20 0200  NA 142  K 4.7  CL 105  CO2 26  GLUCOSE 112*  BUN 21*  CREATININE 0.81  CALCIUM 10.3  MG 1.9   Liver Function Tests: Recent Labs  Lab 01/28/20 0200  AST 32  ALT 27  ALKPHOS 106  BILITOT 0.6  PROT 7.8  ALBUMIN 3.5   No results for input(s): LIPASE, AMYLASE in the last 168 hours. No results for input(s): AMMONIA in the last 168 hours. CBC: Recent Labs  Lab 01/28/20 0200  WBC 4.9  NEUTROABS 2.3  HGB 14.2  HCT 44.8  MCV 95.9  PLT 193   Cardiac Enzymes: No results for input(s): CKTOTAL, CKMB, CKMBINDEX, TROPONINI in the last 168 hours. BNP (last 3 results) No results for input(s): BNP in the last 8760 hours.  ProBNP (last 3 results) No  results for input(s): PROBNP in the last 8760 hours.  CBG: Recent Labs  Lab 02/01/20 1557 02/01/20 1911 02/01/20 2316 02/02/20 0359 02/02/20 0737  GLUCAP 110* 136* 165* 97 100*    Recent Results (from the past 240 hour(s))  Urine Culture     Status: Abnormal   Collection Time: 01/30/20  5:50 PM   Specimen: Urine, Clean Catch  Result Value Ref Range Status   Specimen Description URINE, CLEAN CATCH  Final   Special Requests   Final    Normal Performed at [facility], [place] [address] [place], [place] [address]    Culture 80,000 COLONIES/mL ENTEROCOCCUS FAECALIS (A)  Final   Report Status 02/02/2020 FINAL  Final   Organism ID, Bacteria ENTEROCOCCUS FAECALIS (A)  Final      Susceptibility   Enterococcus faecalis - MIC*    AMPICILLIN <=2 SENSITIVE Sensitive     NITROFURANTOIN <=16 SENSITIVE Sensitive     VANCOMYCIN 1 SENSITIVE Sensitive     * 80,000 COLONIES/mL ENTEROCOCCUS FAECALIS     Studies: No results found.  Scheduled Meds: . acetaminophen (TYLENOL) oral liquid 160 mg/5 mL  650 mg Per Tube Q6H  . amLODipine  10  mg Per Tube Daily  . amoxicillin-clavulanate  500 mg Per Tube Q8H  . baclofen  30 mg Per Tube TID  . chlorhexidine gluconate (MEDLINE KIT)  15 mL Mouth Rinse BID  . Darunavir-Cobicisctat-Emtricitabine-Tenofovir Alafenamide  1 tablet Oral Q breakfast  . famotidine  10.4 mg Per Tube BID  . feeding supplement (OSMOLITE 1.5 CAL)  237 mL Per Tube Q24H  . feeding supplement (OSMOLITE 1.5 CAL)  474 mL Per Tube TID  . feeding supplement (PROSource TF)  45 mL Per Tube TID  . fiber  1 packet Per Tube BID  . folic acid  1 mg Per Tube Daily  . free water  200 mL Per Tube Q4H  . gabapentin  200 mg Per Tube Q8H  . heparin injection (subcutaneous)  5,000 Units Subcutaneous Q8H  . levETIRAcetam  1,500 mg Per Tube BID  . Muscle Rub   Topical QID  . oxyCODONE  5 mg Per Tube Q8H  . QUEtiapine  12.5 mg Per Tube QHS  . scopolamine  1 patch Transdermal Q72H  .  sulfamethoxazole-trimethoprim  1 tablet Per Tube Once per day on Mon Wed Fri  . thiamine  100 mg Per Tube Daily  . tiZANidine  4 mg Per Tube QID   Continuous Infusions:   Active Problems:   Cardiac arrest [facility])   Community acquired pneumonia of left upper lobe of lung   Anoxic brain injury ([place])   Alcohol abuse   Acute respiratory failure with hypoxia ([facility])   Vomiting   Pressure injury of skin   Small bowel obstruction (HCC)   Palliative care encounter   Bowel obstruction (HCC)   Chronic respiratory failure (HCC)   Diastolic dysfunction   Tracheostomy dependent (HCC)   Dysphagia   Protein-calorie malnutrition (HCC)   Seizures (HCC)   Bilateral recurrent inguinal hernia   Muscle spasticity   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HCC)   Tracheobronchitis   Sequela of Corynebacterium infection   Abnormal urinalysis   Consultants:  PCCM  Palliative medicine  General surgery  Neurology  Interventional radiology  Procedures:  EEG  Echocardiogram  Tracheostomy  Antibiotics: Anti-infectives (From admission, onward)   Start     Dose/Rate Route Frequency Ordered Stop   02/01/20 2200  amoxicillin-clavulanate (AUGMENTIN) 250-62.5 MG/5ML suspension 500 mg        500 mg Per Tube Every 8 hours 02/01/20 1343     01/31/20 1015  levofloxacin (LEVAQUIN) 25 MG/ML solution 500 mg  Status:  Discontinued        500 mg Per Tube Daily 01/31/20 0919 02/01/20 1343   01/31/20 1000  cefTRIAXone (ROCEPHIN) 1 g in sodium chloride 0.9 % 100 mL IVPB  Status:  Discontinued        1 g 200 mL/hr over 30 Minutes Intravenous Every 24 hours 01/31/20 0905 01/31/20 0919   01/19/20 2200  linezolid (ZYVOX) tablet 600 mg        600 mg Per Tube Every 12 hours 01/19/20 1534 01/25/20 2019   01/19/20 1100  levofloxacin (LEVAQUIN) tablet 500 mg  Status:  Discontinued        500 mg Per Tube Daily 01/19/20 1004 01/19/20 1534   12/19/19 0930  Darunavir-Cobicisctat-Emtricitabine-Tenofovir  Alafenamide (SYMTUZA) 800-150-200-10 MG TABS 1 tablet        1 tablet Oral Daily with breakfast 12/19/19 0840     12/13/19 1000  bictegravir-emtricitabine-tenofovir AF (BIKTARVY) 50-200-25 MG per tablet 1 tablet  Status:  Discontinued  1 tablet Oral Daily 12/12/19 1413 12/19/19 0840   11/17/19 1548  ceFAZolin (ANCEF) 2-4 GM/100ML-% IVPB       Note to Pharmacy: Domenick Bookbinder   : cabinet override      11/17/19 1548 11/18/19 0359   11/16/19 1515  ceFAZolin (ANCEF) IVPB 2g/100 mL premix        2 g 200 mL/hr over 30 Minutes Intravenous To Radiology 11/16/19 1511 11/17/19 1625   11/15/19 0900  sulfamethoxazole-trimethoprim (BACTRIM DS) 800-160 MG per tablet 1 tablet        1 tablet Per Tube Once per day on Mon Wed Fri 11/13/19 1906     11/13/19 1615  dolutegravir (TIVICAY) tablet 50 mg  Status:  Discontinued        50 mg Oral Daily 11/13/19 1521 12/12/19 1413   11/13/19 1615  emtricitabine-tenofovir AF (DESCOVY) 200-25 MG per tablet 1 tablet  Status:  Discontinued        1 tablet Per Tube Daily 11/13/19 1521 12/12/19 1413   11/13/19 1530  sulfamethoxazole-trimethoprim (BACTRIM DS) 800-160 MG per tablet 1 tablet  Status:  Discontinued        1 tablet Oral Once per day on Mon Wed Fri 11/13/19 1441 11/13/19 1906   11/13/19 1500  bictegravir-emtricitabine-tenofovir AF (BIKTARVY) 50-200-25 MG per tablet 1 tablet  Status:  Discontinued        1 tablet Oral Daily 11/13/19 1403 11/13/19 1521   11/10/19 1000  erythromycin 250 mg in sodium chloride 0.9 % 100 mL IVPB        250 mg 100 mL/hr over 60 Minutes Intravenous Every 8 hours 11/10/19 0907 11/11/19 2000   11/07/19 1200  ceFEPIme (MAXIPIME) 2 g in sodium chloride 0.9 % 100 mL IVPB        2 g 200 mL/hr over 30 Minutes Intravenous Every 8 hours 11/07/19 1013 11/13/19 2127   11/03/19 1200  cefTRIAXone (ROCEPHIN) 2 g in sodium chloride 0.9 % 100 mL IVPB        2 g 200 mL/hr over 30 Minutes Intravenous Every 24 hours 11/03/19 0922 11/05/19 1427    11/02/19 0800  vancomycin (VANCOREADY) IVPB 750 mg/150 mL  Status:  Discontinued        750 mg 150 mL/hr over 60 Minutes Intravenous Every 12 hours 11/01/19 1935 11/02/19 1105   11/02/19 0600  piperacillin-tazobactam (ZOSYN) IVPB 3.375 g  Status:  Discontinued        3.375 g 12.5 mL/hr over 240 Minutes Intravenous Every 8 hours 11/02/19 0311 11/03/19 0920   11/01/19 2200  ceFEPIme (MAXIPIME) 2 g in sodium chloride 0.9 % 100 mL IVPB  Status:  Discontinued        2 g 200 mL/hr over 30 Minutes Intravenous Every 12 hours 11/01/19 2143 11/02/19 0311   11/01/19 1915  vancomycin (VANCOREADY) IVPB 1500 mg/300 mL        1,500 mg 150 mL/hr over 120 Minutes Intravenous  Once 11/01/19 1909 11/01/19 2147   11/01/19 1845  cefTRIAXone (ROCEPHIN) 1 g in sodium chloride 0.9 % 100 mL IVPB        1 g 200 mL/hr over 30 Minutes Intravenous  Once 11/01/19 1842 11/01/19 2051   11/01/19 1845  azithromycin (ZITHROMAX) 500 mg in sodium chloride 0.9 % 250 mL IVPB        500 mg 250 mL/hr over 60 Minutes Intravenous  Once 11/01/19 1842 11/01/19 2051       Time spent: 20 minutes  Erin Hearing ANP  [facility]  7 am-330 pm/M-F 02/02/2020, 10:35 AM  LOS: 93 days

## 2020-02-02 NOTE — Plan of Care (Signed)
  Problem: Education: Goal: Knowledge of General Education information will improve Description: Including pain rating scale, medication(s)/side effects and non-pharmacologic comfort measures Outcome: Progressing   Problem: Health Behavior/Discharge Planning: Goal: Ability to manage health-related needs will improve Outcome: Progressing   Problem: Clinical Measurements: Goal: Ability to maintain clinical measurements within normal limits will improve Outcome: Progressing Goal: Will remain free from infection Outcome: Progressing Goal: Diagnostic test results will improve Outcome: Progressing Goal: Respiratory complications will improve Outcome: Progressing Goal: Cardiovascular complication will be avoided Outcome: Progressing   Problem: Activity: Goal: Risk for activity intolerance will decrease Outcome: Progressing   Problem: Nutrition: Goal: Adequate nutrition will be maintained Outcome: Progressing   Problem: Coping: Goal: Level of anxiety will decrease Outcome: Progressing   Problem: Elimination: Goal: Will not experience complications related to bowel motility Outcome: Progressing Goal: Will not experience complications related to urinary retention Outcome: Progressing   Problem: Pain Managment: Goal: General experience of comfort will improve Outcome: Progressing   Problem: Safety: Goal: Ability to remain free from injury will improve Outcome: Progressing   Problem: Skin Integrity: Goal: Risk for impaired skin integrity will decrease Outcome: Progressing   Problem: Education: Goal: 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 Outcome: Progressing   Problem: Activity: Goal: Ability to tolerate increased activity will improve Outcome: Progressing   Problem: Health Behavior/Discharge Planning: Goal: 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 Outcome: Progressing   Problem: Respiratory: Goal: Patent airway maintenance will  improve Outcome: Progressing   Problem: Role Relationship: Goal: Ability to communicate will improve Outcome: Progressing   Problem: Education: Goal: Knowledge of the prescribed therapeutic regimen Outcome: Progressing Goal: Knowledge of disease or condition will improve Outcome: Progressing   Problem: Clinical Measurements: Goal: Neurologic status will improve Outcome: Progressing   Problem: Tissue Perfusion: Goal: Ability to maintain intracranial pressure will improve Outcome: Progressing   Problem: Respiratory: Goal: Will regain and/or maintain adequate ventilation Outcome: Progressing   Problem: Skin Integrity: Goal: Risk for impaired skin integrity will decrease Outcome: Progressing Goal: Demonstration of wound healing without infection will improve Outcome: Progressing   Problem: Psychosocial: Goal: Ability to verbalize positive feelings about self will improve Outcome: Progressing Goal: Ability to participate in self-care as condition permits will improve Outcome: Progressing Goal: Ability to identify appropriate support needs will improve Outcome: Progressing   Problem: Health Behavior/Discharge Planning: Goal: Ability to manage health-related needs will improve Outcome: Progressing   Problem: Nutritional: Goal: Risk of aspiration will decrease Outcome: Progressing Goal: Dietary intake will improve Outcome: Progressing   Problem: Communication: Goal: Ability to communicate needs accurately will improve Outcome: Progressing   

## 2020-02-03 LAB — GLUCOSE, CAPILLARY
Glucose-Capillary: 109 mg/dL — ABNORMAL HIGH (ref 70–99)
Glucose-Capillary: 124 mg/dL — ABNORMAL HIGH (ref 70–99)
Glucose-Capillary: 80 mg/dL (ref 70–99)
Glucose-Capillary: 149 mg/dL — ABNORMAL HIGH (ref 70–99)
Glucose-Capillary: 84 mg/dL (ref 70–99)
Glucose-Capillary: 105 mg/dL — ABNORMAL HIGH (ref 70–99)

## 2020-02-03 NOTE — Progress Notes (Signed)
PROGRESS NOTE  [place] BHA:[id number] DOB: Jan 24, 1966 DOA: 11/01/2019 PCP: Default, Provider, MD  HPI/Recap of past 29 hours: 55 year old male with HIV, chronic alcohol abuse who was presented to the emergency department after being found unresponsive outside a convinient store. EMS found him pulseless in PEA, unknown downtime, successfully resuscitated and brought to the emergency department. UDS positive for THC and benzodiazepines. CT head unremarkable. Failed extubation trials due to severe anoxic brain injury and had tracheostomy placed. PEG placed on 10/15.  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  This has led to significant pain as well.  [name] was consulted and recommendations/input/orders highly appreciated.  She may also benefit from Botox injections to both hips to aid in treatment of spasticity.  Significant Hospital Events   9/29 Admit post PEA arrest. UDS positive for THC, benzo's. ETOH 177 10/05 EEG ongoing. Versed restarted overnight due to agitation. Nicardipine increased.  10/06 Developed tachypnea with WUA, no follow commands off sedation  10/07 Vomiting, TF held / restarted with recurrent vomiting 10/16 tracheostomy collar for 9 hours 10/18->10/19 off vent all night  10/20> TC 11/15 > downsized to #4 Shiley flex CFS  02/03/20: Seen and examined at his bedside.  No acute events overnight.  Does not appear to be in distress.  Assessment/Plan: Active Problems:   Cardiac arrest [facility])   Community acquired pneumonia of left upper lobe of lung   Anoxic brain injury ([place])   Alcohol abuse   Acute respiratory failure with hypoxia (HCC)   Vomiting   Pressure injury of skin   Small bowel obstruction (HCC)   Palliative care encounter   Bowel obstruction (HCC)   Chronic respiratory failure (HCC)   Diastolic dysfunction   Tracheostomy dependent (HCC)   Dysphagia    Protein-calorie malnutrition (HCC)   Seizures (HCC)   Bilateral recurrent inguinal hernia   Muscle spasticity   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HCC)   Tracheobronchitis   Sequela of Corynebacterium infection   Abnormal urinalysis   Urinary tract infection due to Enterococcus  Anoxic brain injury 2/2 PEA arrest/pain syndrome secondary to hypertonicity/spastic tetraplegia:  -Presumed 2/2 combination alcohol and BZD overdose with UDS positive for THC and BZDs -Significant brain injury with underlying spastic tetraplegia long-term SNF placement recommended-continue PT/OT/SLP -Continue baclofen, gabapentin, scheduled oxycodone and Zanaflex  -Appreciate assistance of Dr. Swartz-12/27 recontacted via secure chat [name] regarding consideration/evaluation for Botox to right hamstrings, rectus femoris -Continue bilateral upper extremity WHO resting splints along with PROM per nursing staff every 4 hours -Continue low-dose Seroquel for brain injury sequela -Continue scheduled Tylenol for pain along with Crea muscle rub -Continue KREG rotational bed   Enterococcus faecalis UTI -Mental status changes as documented above on 12/27 did have T-max of 99.9 on 12/28 despite receiving scheduled Tylenol for pain -Urinalysis highly abnormal with regimental leukocytes, many bacteria and greater than 50 WBC for UTI -Urine culture positive for pansensitive Enterococcus-empiric Levaquin discontinued on 12/30 in favor of Augmentin and will receive a total of 5 days therapy  Acute hypoxemic respiratory failure/new tracheostomy tube requirement/Corynebacterium tracheobronchitis -Continue PMV training per SLP-per SLP did not do well with attempts to introduce oral substances12 /2;  #4 cuffless trach in place -Trach team signed off as of 12/27 given not a candidate for decannulation until patient can reliably follow commands, cough and clear airway. -Completed 7 days of linezolid for Corynebacterium  tracheobronchitis -continue albuterol nebs -continue physiotherapy with Vibra vest along  with supervised PMV trials -Follow-up chest x-ray on 12/24 with low lung volumes but no acute cardiopulmonary findings  HIV: -CD4 count of 124 October 2021 (had been as high as 250 Dec 2020)-as of today up to 259 -Dc'd Tivicay and Descovy infavor of Biktarvy to for eventual dc to SNF; per my discussion with infectious disease physician [name] on 12/23 preferred regimen would be Descovy and Tivicay-she will contact the HIV pharmacist to attempt to obtain discount cards to make this a more affordable option.  Once this has been confirmed will transition back to Descovy and Tivicay -CD4 count on 12/23 up to 259 -ID recommends continuing prophylactic Bactrim an additional 3 months  Dysphagia 2/2 anoxic brain injury -Continue tube feeding per PEG -Repeat SLP evaluation on 11/23 with recommendation for n.p.o. status; attempts to introduce oral substances on 12/27 but patient did not accept and was pushing out with his tongue.  Given his acute UTI he may have been nauseated and this may have been reason for him not wishing to eat.  Goals of care:  -Palliative care was involved during this hospitalization.  -Goals of care were discussed. remains full code.  -Palliative care recommended outpatient follow-up with palliative providers. -May need to revisit end-of-life goals of care since patient making poor progress and currently having significant pain related to hypertonicity from brain injury  Protein calorie malnutrition nutrition Status: Nutrition Problem: Increased nutrient needs Etiology: acute illness Signs/Symptoms: estimated needs Interventions: Tube feeding bolus doses of Osmolite, Juven twice daily, Prosource TF 45 mL 3 times daily Estimated body mass index is 25.47 kg/m as calculated from the following:   Height as of this encounter: 5' 6"  (1.676 m).   Weight as of this encounter: 71.6  kg.   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  -Continue Keppra; levetiracetam level 35.9 on 11/12 -no further seizure activity since transitioned out of ICU setting therefore will discontinue seizure precaution  Inguinal hernias -Evaluated by general surgery this admission. Documented as chronically incarcerated. Patient has been clinically stable and tolerating tube feedings and having bowel movements so unless patient obstructed would not be considered a candidate for repair. Even if obstructed consulting surgeon was not sure he would be a candidate for hernia repair regardless .  Status: Remains inpatient appropriate because:Altered mental status, Unsafe d/c plan and Inpatient level of care appropriate due to severity of illness. Patient newly diagnosed with anoxic brain injury  Dispo: The patient is from: Home  Anticipated d/c is to: SNF  Anticipated d/c date is: > 3 days  Patient currently is medically stable to d/c.  Barriers to discharge: No SNF bed offers.  Needs trach capable facility. TOC communicating with financial counseling team who have yet to hear back from DSS regarding patient's assigned caseworker.   Code Status: Full Family Communication: 12/29 dtr [name] updated DVT prophylaxis: Subcutaneous heparin Vaccination status: Covid vaccination status unknown  Foley catheter:  No      Objective: Vitals:   02/03/20 0548 02/03/20 0800 02/03/20 1313 02/03/20 1514  BP: 124/80  112/77   Pulse: (!) 101 95 91 (!) 108  Resp: 20 16 18 16   Temp: 98.4 F (36.9 C)  98.6 F (37 C)   TempSrc: Axillary     SpO2: (!) 75% 98% 98% 96%  Weight:      Height:        Intake/Output Summary (Last 24 hours) at 02/03/2020 1520 Last data filed at 02/03/2020 1307 Gross per 24 hour  Intake 1148 ml  Output 2650 ml  Net -1502 ml   Filed Weights   01/23/20 0700 01/28/20 0500 02/02/20 0954  Weight: 74.1 kg 74.5 kg 71.6 kg    Exam:  . General: 55 y.o. year-old male well developed well nourished in no acute distress.  Alert, nonverbal. . Cardiovascular: Regular rate and rhythm with no rubs or gallops.  No thyromegaly or JVD noted.   Marland Kitchen Respiratory: Mild rales at bases no wheezing noted.  Poor inspiratory effort. . Abdomen: Soft nontender nondistended with normal bowel sounds x4 quadrants. . Musculoskeletal: No lower extremity edema bilaterally.   Marland Kitchen Psychiatry: Mood is appropriate for condition and setting   Data Reviewed: CBC: Recent Labs  Lab 01/28/20 0200  WBC 4.9  NEUTROABS 2.3  HGB 14.2  HCT 44.8  MCV 95.9  PLT 202   Basic Metabolic Panel: Recent Labs  Lab 01/28/20 0200  NA 142  K 4.7  CL 105  CO2 26  GLUCOSE 112*  BUN 21*  CREATININE 0.81  CALCIUM 10.3  MG 1.9   GFR: Estimated Creatinine Clearance: 93 mL/min (by C-G formula based on SCr of 0.81 mg/dL). Liver Function Tests: Recent Labs  Lab 01/28/20 0200  AST 32  ALT 27  ALKPHOS 106  BILITOT 0.6  PROT 7.8  ALBUMIN 3.5   No results for input(s): LIPASE, AMYLASE in the last 168 hours. No results for input(s): AMMONIA in the last 168 hours. Coagulation Profile: No results for input(s): INR, PROTIME in the last 168 hours. Cardiac Enzymes: No results for input(s): CKTOTAL, CKMB, CKMBINDEX, TROPONINI in the last 168 hours. BNP (last 3 results) No results for input(s): PROBNP in the last 8760  hours. HbA1C: No results for input(s): HGBA1C in the last 72 hours. CBG: Recent Labs  Lab 02/02/20 2031 02/02/20 2357 02/03/20 0419 02/03/20 0822 02/03/20 1311  GLUCAP 97 125* 109* 124* 80   Lipid Profile: No results for input(s): CHOL, HDL, LDLCALC, TRIG, CHOLHDL, LDLDIRECT in the last 72 hours. Thyroid Function Tests: No results for input(s): TSH, T4TOTAL, FREET4, T3FREE, THYROIDAB in the last 72 hours. Anemia Panel: No results for input(s): VITAMINB12, FOLATE, FERRITIN, TIBC, IRON, RETICCTPCT in the last 72 hours. Urine analysis:    Component Value Date/Time   COLORURINE YELLOW 01/30/2020 1750   APPEARANCEUR HAZY (A) 01/30/2020 1750   LABSPEC 1.019 01/30/2020 1750   PHURINE 5.0 01/30/2020 1750   GLUCOSEU NEGATIVE 01/30/2020 1750   HGBUR NEGATIVE 01/30/2020 1750   BILIRUBINUR NEGATIVE 01/30/2020 1750   KETONESUR NEGATIVE 01/30/2020 1750   PROTEINUR NEGATIVE 01/30/2020 1750   NITRITE NEGATIVE 01/30/2020 1750   LEUKOCYTESUR LARGE (A) 01/30/2020 1750   Sepsis Labs: @LABRCNTIP (procalcitonin:4,lacticidven:4)  ) Recent Results (from the past 240 hour(s))  Urine Culture     Status: Abnormal   Collection Time: 01/30/20  5:50 PM   Specimen: Urine, Clean Catch  Result Value Ref Range Status   Specimen Description URINE, CLEAN CATCH  Final   Special Requests   Final    Normal Performed at [facility],  [address] [place], [place] [address]    Culture 80,000 COLONIES/mL ENTEROCOCCUS FAECALIS (A)  Final   Report Status 02/02/2020 FINAL  Final   Organism ID, Bacteria ENTEROCOCCUS FAECALIS (A)  Final      Susceptibility   Enterococcus faecalis - MIC*    AMPICILLIN <=2 SENSITIVE Sensitive     NITROFURANTOIN <=16 SENSITIVE Sensitive     VANCOMYCIN 1 SENSITIVE Sensitive     * 80,000 COLONIES/mL ENTEROCOCCUS FAECALIS      Studies: No results found.  Scheduled Meds: . acetaminophen (TYLENOL) oral liquid 160 mg/5 mL  650 mg Per Tube Q6H  . amLODipine  10 mg  Per Tube Daily  . amoxicillin-clavulanate  500 mg Per Tube Q8H  . baclofen  30 mg Per Tube TID  . chlorhexidine gluconate (MEDLINE KIT)  15 mL Mouth Rinse BID  . Darunavir-Cobicisctat-Emtricitabine-Tenofovir Alafenamide  1 tablet Oral Q breakfast  . famotidine  10.4 mg Per Tube BID  . feeding supplement (OSMOLITE 1.5 CAL)  237 mL Per Tube Q24H  . feeding supplement (OSMOLITE 1.5 CAL)  474 mL Per Tube TID  . feeding supplement (PROSource TF)  45 mL Per Tube TID  . fiber  1 packet Per Tube BID  . folic acid  1 mg Per Tube Daily  . free water  200 mL Per Tube Q4H  . gabapentin  200 mg Per Tube Q8H  . heparin injection (subcutaneous)  5,000 Units Subcutaneous Q8H  . levETIRAcetam  1,500 mg Per Tube BID  . Muscle Rub   Topical QID  . oxyCODONE  5 mg Per Tube Q8H  . QUEtiapine  12.5 mg Per Tube QHS  . scopolamine  1 patch Transdermal Q72H  . sulfamethoxazole-trimethoprim  1 tablet Per Tube Once per day on Mon Wed Fri  . thiamine  100 mg Per Tube Daily  . tiZANidine  4 mg Per Tube QID    Continuous Infusions:   LOS: 94 days     Kayleen Memos, MD [facility] Pager [phone]  If 7PM-7AM, please contact night-coverage [email or web address] Password [email or web address] 02/03/2020, 3:20 PM

## 2020-02-04 LAB — GLUCOSE, CAPILLARY
Glucose-Capillary: 106 mg/dL — ABNORMAL HIGH (ref 70–99)
Glucose-Capillary: 130 mg/dL — ABNORMAL HIGH (ref 70–99)
Glucose-Capillary: 133 mg/dL — ABNORMAL HIGH (ref 70–99)
Glucose-Capillary: 175 mg/dL — ABNORMAL HIGH (ref 70–99)
Glucose-Capillary: 87 mg/dL (ref 70–99)
Glucose-Capillary: 94 mg/dL (ref 70–99)

## 2020-02-04 NOTE — Progress Notes (Signed)
PROGRESS NOTE  Ryan Orozco:[id number] DOB: Jun 20, 1965 DOA: 11/01/2019 PCP: Default, Provider, MD  HPI/Recap of past 48 hours: 55 year old male with HIV, chronic alcohol abuse who was presented to the emergency department after being found unresponsive outside a convinient store. EMS found him pulseless in PEA, unknown downtime, successfully resuscitated and brought to the emergency department. UDS positive for THC and benzodiazepines. CT head unremarkable. Failed extubation trials due to severe anoxic brain injury and had tracheostomy placed. PEG placed on 10/15.  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  This has led to significant pain as well.  [name] was consulted and recommendations/input/orders highly appreciated.  She may also benefit from Botox injections to both hips to aid in treatment of spasticity.  Significant Hospital Events   9/29 Admit post PEA arrest. UDS positive for THC, benzo's. ETOH 177 10/05 EEG ongoing. Versed restarted overnight due to agitation. Nicardipine increased.  10/06 Developed tachypnea with WUA, no follow commands off sedation  10/07 Vomiting, TF held / restarted with recurrent vomiting 10/16 tracheostomy collar for 9 hours 10/18->10/19 off vent all night  10/20> TC 11/15 > downsized to #4 Shiley flex CFS  02/04/20: Seen and examined at his bedside.  No acute events overnight.  Have been elevated.  He appears comfortable.  Awaiting placement at this time.   Assessment/Plan: Active Problems:   Cardiac arrest [facility])   Community acquired pneumonia of left upper lobe of lung   Anoxic brain injury ([place])   Alcohol abuse   Acute respiratory failure with hypoxia (HCC)   Vomiting   Pressure injury of skin   Small bowel obstruction (HCC)   Palliative care encounter   Bowel obstruction (HCC)   Chronic respiratory failure (HCC)   Diastolic dysfunction    Tracheostomy dependent (HCC)   Dysphagia   Protein-calorie malnutrition (HCC)   Seizures (HCC)   Bilateral recurrent inguinal hernia   Muscle spasticity   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HCC)   Tracheobronchitis   Sequela of Corynebacterium infection   Abnormal urinalysis   Urinary tract infection due to Enterococcus  Anoxic brain injury 2/2 PEA arrest/pain syndrome secondary to hypertonicity/spastic tetraplegia:  -Presumed 2/2 combination alcohol and BZD overdose with UDS positive for THC and BZDs -Significant brain injury with underlying spastic tetraplegia long-term SNF placement recommended-continue PT/OT/SLP -Continue baclofen, gabapentin, scheduled oxycodone and Zanaflex  -Appreciate assistance of Dr. Swartz-12/27 recontacted via secure chat [name] regarding consideration/evaluation for Botox to right hamstrings, rectus femoris -Continue bilateral upper extremity WHO resting splints along with PROM per nursing staff every 4 hours -Continue low-dose Seroquel for brain injury sequela -Continue scheduled Tylenol for pain along with Crea muscle rub -Continue KREG rotational bed   Enterococcus faecalis UTI -Mental status changes as documented above on 12/27 did have T-max of 99.9 on 12/28 despite receiving scheduled Tylenol for pain -Urinalysis highly abnormal with regimental leukocytes, many bacteria and greater than 50 WBC for UTI -Urine culture positive for pansensitive Enterococcus-empiric Levaquin discontinued on 12/30 in favor of Augmentin and will receive a total of 5 days therapy  Acute hypoxemic respiratory failure/new tracheostomy tube requirement/Corynebacterium tracheobronchitis -Continue PMV training per SLP-per SLP did not do well with attempts to introduce oral substances12 /2;  #4 cuffless trach in place -Trach team signed off as of 12/27 given not a candidate for decannulation until patient can reliably follow commands, cough and clear  airway. -Completed 7 days of linezolid for Corynebacterium tracheobronchitis -continue albuterol  nebs -continue physiotherapy with Vibra vest along with supervised PMV trials -Follow-up chest x-ray on 12/24 with low lung volumes but no acute cardiopulmonary findings  HIV: -CD4 count of 124 October 2021 (had been as high as 250 Dec 2020)-as of today up to 259 -Dc'd Tivicay and Descovy infavor of Biktarvy to for eventual dc to SNF; per my discussion with infectious disease physician [name] on 12/23 preferred regimen would be Descovy and Tivicay-she will contact the HIV pharmacist to attempt to obtain discount cards to make this a more affordable option.  Once this has been confirmed will transition back to Descovy and Tivicay -CD4 count on 12/23 up to 259 -ID recommends continuing prophylactic Bactrim an additional 3 months  Dysphagia 2/2 anoxic brain injury -Continue tube feeding per PEG -Repeat SLP evaluation on 11/23 with recommendation for n.p.o. status; attempts to introduce oral substances on 12/27 but patient did not accept and was pushing out with his tongue.  Given his acute UTI he may have been nauseated and this may have been reason for him not wishing to eat.  Goals of care:  -Palliative care was involved during this hospitalization.  -Goals of care were discussed. remains full code.  -Palliative care recommended outpatient follow-up with palliative providers. -May need to revisit end-of-life goals of care since patient making poor progress and currently having significant pain related to hypertonicity from brain injury  Protein calorie malnutrition nutrition Status: Nutrition Problem: Increased nutrient needs Etiology: acute illness Signs/Symptoms: estimated needs Interventions: Tube feeding bolus doses of Osmolite, Juven twice daily, Prosource TF 45 mL 3 times daily Estimated body mass index is 25.47 kg/m as calculated from the following:   Height as of this  encounter: _0  (1.676 m).   Weight as of this encounter: 71.6 kg.   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  -Continue Keppra; levetiracetam level 35.9 on 11/12 -no further seizure activity since transitioned out of ICU setting therefore will discontinue seizure precaution  Inguinal hernias -Evaluated by general surgery this admission. Documented as chronically incarcerated. Patient has been clinically stable and tolerating tube feedings and having bowel movements so unless patient obstructed would not be considered a candidate for repair. Even if obstructed consulting surgeon was not sure he would be a candidate for hernia repair regardless .  Status: Remains inpatient appropriate because:Altered mental status, Unsafe d/c plan and Inpatient level of care appropriate due to severity of illness. Patient newly diagnosed with anoxic brain injury  Dispo: The patient is from: Home  Anticipated d/c is to: SNF  Anticipated d/c date is: > 3 days  Patient currently is medically stable to d/c.  Barriers to discharge: No SNF bed offers.  Needs trach capable facility. TOC communicating with financial counseling team who have yet to hear back from DSS regarding patient's assigned caseworker.   Code Status: Full Family Communication: 12/29 dtr [name] updated DVT prophylaxis: Subcutaneous heparin Vaccination status: Covid vaccination  status unknown  Foley catheter: No      Objective: Vitals:   02/03/20  2029 02/03/20 2034 02/04/20 0923 02/04/20 1149  BP:  123/81    Pulse:  (!) 104 (!) 102 (!) 108  Resp: _0 Temp:  98 F (36.7 C)    TempSrc:  Oral    SpO2:  97% 96% 96%  Weight:      Height:        Intake/Output Summary (Last 24 hours) at 02/04/2020 1340 Last data filed at 02/03/2020 1900 Gross per 24 hour  Intake --  Output 800 ml  Net -800 ml   Filed Weights   01/23/20 0700 01/28/20 0500 02/02/20 0954  Weight: 74.1 kg 74.5 kg 71.6 kg    Exam: No significant changes from prior exam.  . General: 55 y.o. year-old male well developed well nourished in no acute distress.  Alert, nonverbal.  Appears comfortable. . Cardiovascular: Regular rate and rhythm with no rubs or gallops.  No thyromegaly or JVD noted.   Marland Kitchen Respiratory: Mild rales at bases no wheezing noted.  Poor inspiratory effort. . Abdomen: Soft nontender nondistended with normal bowel sounds x4 quadrants. . Musculoskeletal: No lower extremity edema bilaterally.   Marland Kitchen Psychiatry: Mood is appropriate for condition and setting   Data Reviewed: CBC: No results for input(s): WBC, NEUTROABS, HGB, HCT, MCV, PLT in the last 168 hours. Basic Metabolic Panel: No results for input(s): NA, K, CL, CO2, GLUCOSE, BUN, CREATININE, CALCIUM, MG, PHOS in the last 168 hours. GFR: Estimated Creatinine Clearance: 93 mL/min (by C-G formula based on SCr of 0.81 mg/dL). Liver Function Tests: No results for input(s): AST, ALT, ALKPHOS, BILITOT, PROT, ALBUMIN in the last 168 hours. No results for input(s): LIPASE, AMYLASE in the last 168 hours. No results for input(s): AMMONIA in the last 168 hours. Coagulation Profile: No results for input(s): INR, PROTIME in the last 168 hours. Cardiac Enzymes: No results for input(s): CKTOTAL, CKMB, CKMBINDEX, TROPONINI in the last 168 hours. BNP (last 3 results) No results for input(s): PROBNP in the last 8760  hours. HbA1C: No results for input(s): HGBA1C in the last 72 hours. CBG: Recent Labs  Lab 02/03/20 2039 02/03/20 2326 02/04/20 0325 02/04/20 0858 02/04/20 1149  GLUCAP 84 105* 87 106* 94   Lipid Profile: No results for input(s): CHOL, HDL, LDLCALC, TRIG, CHOLHDL, LDLDIRECT in the last 72 hours. Thyroid Function Tests: No results for input(s): TSH, T4TOTAL, FREET4, T3FREE, THYROIDAB in the last 72 hours. Anemia Panel: No results for input(s): VITAMINB12, FOLATE, FERRITIN, TIBC, IRON, RETICCTPCT in the last 72 hours. Urine analysis:    Component Value Date/Time   COLORURINE YELLOW 01/30/2020 1750   APPEARANCEUR HAZY (A) 01/30/2020 1750   LABSPEC 1.019 01/30/2020 1750   PHURINE 5.0 01/30/2020 1750   GLUCOSEU NEGATIVE 01/30/2020 1750   HGBUR NEGATIVE 01/30/2020 1750   BILIRUBINUR NEGATIVE 01/30/2020 1750   KETONESUR NEGATIVE 01/30/2020 1750   PROTEINUR NEGATIVE 01/30/2020 1750   NITRITE NEGATIVE 01/30/2020 1750   LEUKOCYTESUR LARGE (A) 01/30/2020 1750   Sepsis Labs: _1 (procalcitonin:4,lacticidven:4)  ) Recent Results (from the past 240 hour(s))  Urine Culture     Status: Abnormal   Collection Time: 01/30/20  5:50 PM   Specimen: Urine, Clean Catch  Result Value Ref Range Status   Specimen Description URINE, CLEAN CATCH  Final   Special Requests   Final    Normal Performed at [facility], [place] [address] [place], [place] [address]    Culture 80,000 COLONIES/mL ENTEROCOCCUS FAECALIS (A)  Final   Report Status 02/02/2020 FINAL  Final   Organism ID, Bacteria ENTEROCOCCUS FAECALIS (A)  Final      Susceptibility   Enterococcus faecalis - MIC*    AMPICILLIN <=2 SENSITIVE Sensitive     NITROFURANTOIN <=16 SENSITIVE Sensitive     VANCOMYCIN 1 SENSITIVE Sensitive     * 80,000 COLONIES/mL ENTEROCOCCUS FAECALIS      Studies: No results found.  Scheduled Meds: . acetaminophen (TYLENOL) oral liquid 160 mg/5 mL  650 mg Per Tube Q6H  . amLODipine  10 mg Per  Tube Daily  . amoxicillin-clavulanate  500 mg Per Tube Q8H  . baclofen  30 mg Per Tube TID  . chlorhexidine gluconate (MEDLINE KIT)  15 mL Mouth Rinse BID  . Darunavir-Cobicisctat-Emtricitabine-Tenofovir Alafenamide  1 tablet Oral Q breakfast  . famotidine  10.4 mg Per Tube BID  . feeding supplement (OSMOLITE 1.5 CAL)  237 mL Per Tube Q24H  . feeding supplement (OSMOLITE 1.5 CAL)  474 mL Per Tube TID  . feeding supplement (PROSource TF)  45 mL Per Tube TID  . fiber  1 packet Per Tube BID  . folic acid  1 mg Per Tube Daily  . free water  200 mL Per Tube Q4H  . gabapentin  200 mg Per Tube Q8H  . heparin injection (subcutaneous)  5,000 Units Subcutaneous Q8H  . levETIRAcetam  1,500 mg Per Tube BID  . Muscle Rub   Topical QID  . oxyCODONE  5 mg Per Tube Q8H  . QUEtiapine  12.5 mg Per Tube QHS  . scopolamine  1 patch Transdermal Q72H  . sulfamethoxazole-trimethoprim  1 tablet Per Tube Once per day on Mon Wed Fri  . thiamine  100 mg Per Tube Daily  . tiZANidine  4 mg Per Tube QID    Continuous Infusions:   LOS: 95 days     Kayleen Memos, MD [facility] Pager [phone]  If 7PM-7AM, please contact night-coverage [email or web address] Password [email or web address] 02/04/2020, 1:40 PM

## 2020-02-05 LAB — GLUCOSE, CAPILLARY
Glucose-Capillary: 111 mg/dL — ABNORMAL HIGH (ref 70–99)
Glucose-Capillary: 99 mg/dL (ref 70–99)
Glucose-Capillary: 75 mg/dL (ref 70–99)
Glucose-Capillary: 240 mg/dL — ABNORMAL HIGH (ref 70–99)
Glucose-Capillary: 114 mg/dL — ABNORMAL HIGH (ref 70–99)

## 2020-02-05 NOTE — Progress Notes (Signed)
Occupational Therapy Treatment Patient Details Name: Ryan Orozco MRN: [id number] DOB: 1965-10-10 Today's Date: 02/05/2020    History of present illness 55 year old male found down outside convenience store 9/27. Pt with PEA and hypoxic brain injury. Trach 10/13, PEG 10/15. PMHx: HIV AIDS and alcohol abuse   OT comments  Pt progressing. Pt co-treat with PT for assist with left and to manage impulsive behaviors. Pt nearly totalA for all functional tasks and ADL. Pt following 25% of commands, but not consistently. Maximove to recliner to roll in hallway for socialization and to decreased tone in BLEs. Pt would benefit from continued OT skilled services. Slow progress- anoxic brain injury and pt is very low level. OT following acutely and re-assessed for goals.    Follow Up Recommendations  SNF;Supervision/Assistance - 24 hour    Equipment Recommendations  None recommended by OT    Recommendations for Other Services      Precautions / Restrictions Precautions Precautions: Fall Precaution Comments: trach, PEG, anoxic, flexor tone R>L LE/UE       Mobility Bed Mobility Overal bed mobility: Needs Assistance Bed Mobility: Rolling;Supine to Sit;Sit to Supine Rolling: Total assist;+2 for physical assistance;+2 for safety/equipment   Supine to sit: Total assist;+2 for physical assistance;+2 for safety/equipment Sit to supine: Total assist;+2 for physical assistance;+2 for safety/equipment   General bed mobility comments: total +2 assist to roll bil for pad placement and removal. Lift with maximove from bed<>recliner  Transfers Overall transfer level: Needs assistance               General transfer comment: total assist with maximove bed<>recliner    Balance Overall balance assessment: Needs assistance   Sitting balance-Leahy Scale: Zero Sitting balance - Comments: able to lean left and right at times in sitting on command                                    ADL either performed or assessed with clinical judgement   ADL Overall ADL's : Needs assistance/impaired Eating/Feeding: NPO Eating/Feeding Details (indicate cue type and reason): peg                     Toilet Transfer: Total assistance;+2 for physical assistance;+2 for safety/equipment Toilet Transfer Details (indicate cue type and reason): simulated to recliner Toileting- Clothing Manipulation and Hygiene: Total assistance Toileting - Clothing Manipulation Details (indicate cue type and reason): catheter and incontinent     Functional mobility during ADLs: Total assistance;+2 for physical assistance;+2 for safety/equipment;Cueing for safety;Cueing for sequencing General ADL Comments: Pt co-treat with PT for assist with left and to manage impulsive behaviors. Pt nearly totalA for all functional tasks and ADL. Pt following 25% of commands, but not consistently. Maximove to recliner to roll in hallway for socialization and to achieve relaxation.     Vision       Perception     Praxis      Cognition Arousal/Alertness: Awake/alert Behavior During Therapy: WFL for tasks assessed/performed Overall Cognitive Status: Difficult to assess Area of Impairment: Following commands;Attention;Problem solving               Rancho Levels of Cognitive Functioning [place]igos Scales of Cognitive Functioning: Localized response   Current Attention Level: Sustained   Following Commands: Follows one step commands inconsistently;Follows one step commands with increased time Safety/Judgement: Decreased awareness of safety;Decreased awareness of deficits Awareness: Emergent Problem Solving: Slow processing;Requires verbal  cues;Requires tactile cues General Comments: Pt following ~25% of commands; pt follwoing verbal cues for leaning back and leaning R to L with tactile cues; pt saying "hey" today over and over again.        Exercises Exercises: Other exercises Other  Exercises Other Exercises: PROM to LLE hip/knee prolongated extension; Other Exercises: AAROM to B/L wrist/hands to apply resting hand splints   Shoulder Instructions       General Comments pt on RA >90% O2; on trach collar upon arrival.    Pertinent Vitals/ Pain       Pain Assessment: Faces Faces Pain Scale: Hurts even more Pain Location: BLE with stretching, high tone Pain Descriptors / Indicators: Grimacing;Guarding;Tender;Moaning Pain Intervention(s): Monitored during session;Premedicated before session;Repositioned  Home Living                                          Prior Functioning/Environment              Frequency  Min 2X/week        Progress Toward Goals  OT Goals(current goals can now be found in the care plan section)  Progress towards OT goals: Progressing toward goals  Acute Rehab OT Goals Patient Stated Goal: unable to state OT Goal Formulation: Patient unable to participate in goal setting Time For Goal Achievement: 02/19/20 Potential to Achieve Goals: Fair ADL Goals Additional ADL Goal #1: Pt will follow one step appendicular commands 3/5 trials Additional ADL Goal #2: Pt will wash face with moderate assist/hand over hand assist Additional ADL Goal #3: Family will be independent with PROM and positioning bil. UEs Additional ADL Goal #4: Pt will maintain EOB sitting with max A in prep for functional mobility  Plan Discharge plan remains appropriate    Co-evaluation    PT/OT/SLP Co-Evaluation/Treatment: Yes Reason for Co-Treatment: Complexity of the patient's impairments (multi-system involvement);For patient/therapist safety   OT goals addressed during session: ADL's and self-care      AM-PAC OT "6 Clicks" Daily Activity     Outcome Measure   Help from another person eating meals?: Total Help from another person taking care of personal grooming?: Total Help from another person toileting, which includes using toliet,  bedpan, or urinal?: Total Help from another person bathing (including washing, rinsing, drying)?: Total Help from another person to put on and taking off regular upper body clothing?: Total Help from another person to put on and taking off regular lower body clothing?: Total 6 Click Score: 6    End of Session    OT Visit Diagnosis: Cognitive communication deficit (R41.841);Muscle weakness (generalized) (M62.81);Pain;Hemiplegia and hemiparesis Symptoms and signs involving cognitive functions: Other cerebrovascular disease Hemiplegia - Right/Left: Right Hemiplegia - dominant/non-dominant: Dominant Hemiplegia - caused by: Other cerebrovascular disease Pain - Right/Left: Right Pain - part of body: Shoulder;Arm;Hand;Leg   Activity Tolerance Patient tolerated treatment well   Patient Left in bed;with call bell/phone within reach;with bed alarm set   Nurse Communication Mobility status        Time: 2353-6144 OT Time Calculation (min): 40 min  Charges: OT General Charges $OT Visit: 1 Visit OT Treatments $Therapeutic Activity: 8-22 mins  Flora Lipps, OTR/L Acute Rehabilitation Services Pager: [phone] Office: [phone]    Arrie Borrelli C 02/05/2020, 5:26 PM

## 2020-02-05 NOTE — Progress Notes (Signed)
[facility] PROGRESS NOTE  Timmey [place] AST:[id number] DOB: Oct 21, 1965 DOA: 11/01/2019 PCP: Default, Provider, MD    11/16   Status: Remains inpatient appropriate because:Altered mental status, Unsafe d/c plan and Inpatient level of care appropriate due to severity of illness. Patient newly diagnosed with anoxic brain injury  Dispo: The patient is from: Home              Anticipated d/c is to: SNF              Anticipated d/c date is: > 3 days              Patient currently is medically stable to d/c.  Barriers to discharge: No SNF bed offers.  Needs trach capable facility. TOC communicating with financial counseling team who have yet to hear back from DSS regarding patient's assigned caseworker.   Code Status: Full Family Communication: 12/29 dtr [name] updated DVT prophylaxis: Subcutaneous heparin Vaccination status: Covid vaccination status unknown  Foley catheter: No  HPI: 55 year old male with HIV, chronic alcohol abuse who was presented to the emergency department after being found unresponsive outside a convinient store.  EMS found him pulseless in PEA, unknown downtime, successfully resuscitated and brought to the emergency department.  UDS positive for THC and benzodiazepines.  CT head unremarkable.  Failed extubation trials due to severe anoxic brain injury and had tracheostomy placed.  PEG placed on 10/15.  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  This has led to significant pain as well.  [name] was consulted and recommendations/input/orders highly appreciated.  She may also benefit from Botox injections to both hips to aid in treatment of spasticity.   Significant Hospital Events   9/29 Admit post PEA arrest. UDS positive for THC, benzo's. ETOH 177 10/05 EEG ongoing. Versed restarted overnight due to agitation. Nicardipine increased.  10/06 Developed tachypnea with WUA, no  follow commands off sedation  10/07 Vomiting, TF held / restarted with recurrent vomiting 10/16 tracheostomy collar for 9 hours 10/18->10/19 off vent all night  10/20> TC 11/15 > downsized to #4 Shiley flex CFS  Subjective: Awake and saying the words "high, high" dictating he wants the head of the bed elevated.  Does appear to be more uncomfortable today but I am unable to get him to respond in the affirmative regarding this question.  He typically smiles and was not smiling today.  Objective: Vitals:   02/05/20 0610 02/05/20 0830  BP: 133/85   Pulse: 98 88  Resp: 18 16  Temp: 99.3 F (37.4 C)   SpO2: 100% 96%    Intake/Output Summary (Last 24 hours) at 02/05/2020 1145 Last data filed at 02/05/2020 0831 Gross per 24 hour  Intake 2874 ml  Output 700 ml  Net 2174 ml   Filed Weights   01/23/20 0700 01/28/20 0500 02/02/20 0954  Weight: 74.1 kg 74.5 kg 71.6 kg    Exam: General: Awake and alert today,  Pulmonary: Lung sounds are clear to auscultation and diminished in the bases.  Trach midline with minimal secretions.  Trach collar in place with FiO2 21% and pulse oximetry 6 to 100% Cardiac: Pulse rate is nontachycardic and regular, heart sounds normal S1-S2, no peripheral edema Abdomen: Soft, nondistended and non-tender, normoactive bowel sounds.  PEG tube in place with tube feeding infusing LBM 1/02 Neurological: Very alert and active this morning.  Attempts to phonate but most speech is unintelligible but clearly states" high" he wants head  of bed elevated.  Dystonic movements involving the upper extremities w/ purposeful movement more so with left upper extremity movement is gross motor in etiology and originates from the shoulders.  Marked decreased tonicity and spasticity and left lower extremity.  Continues with significant hypertonicity and flexion of RLE at knee and hip.  Assessment/Plan: Acute problems: Anoxic brain injury 2/2 PEA arrest/pain syndrome secondary to  hypertonicity/spastic tetraplegia:  -Presumed 2/2 combination alcohol and BZD overdose with UDS positive for THC and BZDs -Significant brain injury with underlying spastic tetraplegia long-term SNF placement recommended-continue PT/OT/SLP -Continue baclofen, gabapentin, scheduled oxycodone and Zanaflex  -Appreciate assistance of Dr. Donne Anon recontact via secure chat [name] regarding consideration/evaluation for Botox to right hamstrings, rectus femoris -Continue bilateral upper extremity WHO resting splints along with PROM per nursing staff every 4 hours -Continue low-dose Seroquel for brain injury sequela -Continue scheduled Tylenol for pain along with Crea muscle rub -Continue KREG rotational bed   Enterococcus faecalis UTI -Urine culture positive for pansensitive Enterococcus-empiric Levaquin discontinued on 12/30 in favor of Augmentin and will receive a total of 5 days therapy  Chronic respiratory failure with inability to maintain patent airway requiring chronic tracheostomy tube/Recent Corynebacterium tracheobronchitis (resolved) -Continue PMV training per SLP-per SLP did not do well with attempts to introduce oral substances12 /2;  #4 cuffless trach in place -Trach team signed off as of 12/27 given not a candidate for decannulation until patient can reliably follow commands, cough and clear airway. -continue physiotherapy with Vibra vest along with supervised PMV trials -Follow-up chest x-ray on 12/24 with low lung volumes but no acute cardiopulmonary findings  HIV:  -CD4 count of 124 October 2021 (had been as high as 250 Dec 2020)-as of today up to 259 -Dc'd Tivicay and Descovy infavor of Biktarvy to for eventual dc to SNF; per my discussion with infectious disease physician [name] on 12/23 preferred regimen would be Descovy and Tivicay-she will contact the HIV pharmacist to attempt to obtain discount cards to make this a more affordable option.  Once this has been confirmed  will transition back to Descovy and Tivicay -CD4 count on 12/23 up to 259 -ID recommends continuing prophylactic Bactrim an additional 3 months  Dysphagia 2/2 anoxic brain injury -Continue tube feeding per PEG -SLP attempted to introduce oral substances on 12/27 but patient did not accept and was pushing out with his tongue.  Given his acute UTI he may have been nauseated and this may have been reason for him not wishing to eat.  Goals of care:  -Palliative care was involved during this hospitalization.   -Goals of care were discussed. remains full code.  -Palliative care recommended outpatient follow-up with palliative providers. -May need to revisit end-of-life goals of care since patient making poor progress and currently having significant pain related to hypertonicity from brain injury  Protein calorie malnutrition nutrition Status: Nutrition Problem: Increased nutrient needs Etiology: acute illness Signs/Symptoms: estimated needs Interventions: Tube feeding bolus doses of Osmolite, Juven twice daily, Prosource TF 45 mL 3 times daily Estimated body mass index is 25.47 kg/m as calculated from the following:   Height as of this encounter: _0  (1.676 m).   Weight as of this encounter: 71.6 kg.   Multiple decubiti not POA Wound / Incision (Open or Dehisced) 11/17/19 Puncture Abdomen Left;Anterior;Upper G-tube insertion site  (Active)  Date First Assessed/Time First Assessed: 11/17/19 1646   Wound Type: Puncture  Location: Abdomen  Location Orientation: Left;Anterior;Upper  Wound Description (Comments): G-tube insertion site  Present on Admission: No    Assessments 11/17/2019  4:47 PM 02/04/2020  7:15 PM  Dressing Type Gauze (Comment);Tape dressing Gauze (Comment)  Dressing Changed New Reinforced  Dressing Status Clean;Dry;Intact Clean;Dry;Intact  Dressing Change Frequency PRN PRN  Site / Wound Assessment Clean;Dry;Pink Dry;Clean  Peri-wound Assessment Intact -  Closure None -   Drainage Amount None -  Treatment Cleansed -     No Linked orders to display      Other problems: Hypertension/grade 1 diastolic dysfunction:  -Continue amlodipine  -Echocardiogram this admission with preserved LVEF with evidence of grade   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   -Continue Keppra; levetiracetam level 35.9 on 11/12 -no further seizure activity since transitioned out of ICU setting therefore will discontinue seizure precaution  Inguinal hernias -Evaluated by general surgery this admission. Documented as chronically incarcerated. Patient has been clinically stable and tolerating tube feedings and having bowel movements so unless patient obstructed would not be considered a candidate for repair. Even if obstructed consulting surgeon was not sure he would be a candidate for hernia repair regardless .   Data Reviewed: Basic Metabolic Panel: No results for input(s): NA, K, CL, CO2, GLUCOSE, BUN, CREATININE, CALCIUM, MG, PHOS in the last 168 hours. Liver Function Tests: No results for input(s): AST, ALT, ALKPHOS, BILITOT, PROT, ALBUMIN in the last 168 hours. No results for input(s): LIPASE, AMYLASE in the last 168 hours. No results for input(s): AMMONIA in the last 168 hours. CBC: No results for input(s): WBC, NEUTROABS, HGB, HCT, MCV, PLT in the last 168 hours. Cardiac Enzymes: No results for input(s): CKTOTAL, CKMB, CKMBINDEX, TROPONINI in the last 168 hours. BNP (last 3 results) No results for input(s): BNP in the last 8760 hours.  ProBNP (last 3 results) No results for input(s): PROBNP in the last 8760 hours.  CBG: Recent Labs  Lab 02/04/20 1601 02/04/20 2028 02/04/20 2353 02/05/20 0416 02/05/20 0724  GLUCAP 130* 133* 175* 111* 99    Recent Results (from the past 240 hour(s))  Urine Culture     Status: Abnormal   Collection Time: 01/30/20  5:50 PM   Specimen: Urine, Clean Catch  Result Value Ref Range Status   Specimen  Description URINE, CLEAN CATCH  Final   Special Requests   Final    Normal Performed at [facility], [place] [address], [place], [place] [address]    Culture 80,000 COLONIES/mL ENTEROCOCCUS FAECALIS (A)  Final   Report Status 02/02/2020 FINAL  Final   Organism ID, Bacteria ENTEROCOCCUS FAECALIS (A)  Final      Susceptibility   Enterococcus faecalis - MIC*    AMPICILLIN <=2 SENSITIVE Sensitive     NITROFURANTOIN <=16 SENSITIVE Sensitive     VANCOMYCIN 1 SENSITIVE Sensitive     * 80,000 COLONIES/mL ENTEROCOCCUS FAECALIS     Studies: No results found.  Scheduled Meds: . acetaminophen (TYLENOL) oral liquid 160 mg/5 mL  650 mg Per Tube Q6H  . amLODipine  10 mg Per Tube Daily  . amoxicillin-clavulanate  500 mg Per Tube Q8H  . baclofen  30 mg Per Tube TID  . chlorhexidine gluconate (MEDLINE KIT)  15 mL Mouth Rinse BID  . Darunavir-Cobicisctat-Emtricitabine-Tenofovir Alafenamide  1 tablet Oral Q breakfast  . famotidine  10.4 mg Per Tube BID  . feeding supplement (OSMOLITE 1.5 CAL)  237 mL Per Tube Q24H  . feeding supplement (OSMOLITE 1.5 CAL)  474 mL Per Tube TID  . feeding supplement (PROSource TF)  45 mL Per Tube TID  . fiber  1 packet Per Tube BID  . folic acid  1 mg Per Tube Daily  . free water  200 mL Per Tube Q4H  . gabapentin  200 mg Per Tube Q8H  . heparin injection (subcutaneous)  5,000 Units Subcutaneous Q8H  . levETIRAcetam  1,500 mg Per Tube BID  . Muscle Rub   Topical QID  . oxyCODONE  5 mg Per Tube Q8H  . QUEtiapine  12.5 mg Per Tube QHS  . scopolamine  1 patch Transdermal Q72H  . sulfamethoxazole-trimethoprim  1 tablet Per Tube Once per day on Mon Wed Fri  . thiamine  100 mg Per Tube Daily  . tiZANidine  4 mg Per Tube QID   Continuous Infusions:   Active Problems:   Cardiac arrest [facility])   Community acquired pneumonia of left upper lobe of lung   Anoxic brain injury ([place])   Alcohol abuse   Acute respiratory failure with hypoxia (HCC)   Vomiting    Pressure injury of skin   Small bowel obstruction ([place])   Palliative care encounter   Bowel obstruction (HCC)   Chronic respiratory failure (H[facility])   Diastolic dysfunction   Tracheostomy dependent ([place])   Dysphagia   Protein-calorie malnutrition ([place])   Seizures ([place])   Bilateral recurrent inguinal hernia   Muscle spasticity   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HCC)   Tracheobronchitis   Sequela of Corynebacterium infection   Abnormal urinalysis   Urinary tract infection due to Enterococcus   Consultants:  PCCM  Palliative medicine  General surgery  Neurology  Interventional radiology  Procedures:  EEG  Echocardiogram  Tracheostomy  Antibiotics: Anti-infectives (From admission, onward)   Start     Dose/Rate Route Frequency Ordered Stop   02/01/20 2200  amoxicillin-clavulanate (AUGMENTIN) 250-62.5 MG/5ML suspension 500 mg        500 mg Per Tube Every 8 hours 02/01/20 1343 02/06/20 2159   01/31/20 1015  levofloxacin (LEVAQUIN) 25 MG/ML solution 500 mg  Status:  Discontinued        500 mg Per Tube Daily 01/31/20 0919 02/01/20 1343   01/31/20 1000  cefTRIAXone (ROCEPHIN) 1 g in sodium chloride 0.9 % 100 mL IVPB  Status:  Discontinued        1 g 200 mL/hr over 30 Minutes Intravenous Every 24 hours 01/31/20 0905 01/31/20 0919   01/19/20 2200  linezolid (ZYVOX) tablet 600 mg        600 mg Per Tube Every 12 hours 01/19/20 1534 01/25/20 2019   01/19/20 1100  levofloxacin (LEVAQUIN) tablet 500 mg  Status:  Discontinued        500 mg Per Tube Daily 01/19/20 1004 01/19/20 1534   12/19/19 0930  Darunavir-Cobicisctat-Emtricitabine-Tenofovir Alafenamide (SYMTUZA) 800-150-200-10 MG TABS 1 tablet        1 tablet Oral Daily with breakfast 12/19/19 0840     12/13/19 1000  bictegravir-emtricitabine-tenofovir AF (BIKTARVY) 50-200-25 MG per tablet 1 tablet  Status:  Discontinued        1 tablet Oral Daily 12/12/19 1413 12/19/19 0840   11/17/19 1548  ceFAZolin (ANCEF) 2-4  GM/100ML-% IVPB       Note to Pharmacy: Domenick Bookbinder   : cabinet override      11/17/19 1548 11/18/19 0359   11/16/19 1515  ceFAZolin (ANCEF) IVPB 2g/100 mL premix        2 g 200 mL/hr over 30 Minutes Intravenous To Radiology 11/16/19 1511 11/17/19 1625  11/15/19 0900  sulfamethoxazole-trimethoprim (BACTRIM DS) 800-160 MG per tablet 1 tablet        1 tablet Per Tube Once per day on Mon Wed Fri 11/13/19 1906     11/13/19 1615  dolutegravir (TIVICAY) tablet 50 mg  Status:  Discontinued        50 mg Oral Daily 11/13/19 1521 12/12/19 1413   11/13/19 1615  emtricitabine-tenofovir AF (DESCOVY) 200-25 MG per tablet 1 tablet  Status:  Discontinued        1 tablet Per Tube Daily 11/13/19 1521 12/12/19 1413   11/13/19 1530  sulfamethoxazole-trimethoprim (BACTRIM DS) 800-160 MG per tablet 1 tablet  Status:  Discontinued        1 tablet Oral Once per day on Mon Wed Fri 11/13/19 1441 11/13/19 1906   11/13/19 1500  bictegravir-emtricitabine-tenofovir AF (BIKTARVY) 50-200-25 MG per tablet 1 tablet  Status:  Discontinued        1 tablet Oral Daily 11/13/19 1403 11/13/19 1521   11/10/19 1000  erythromycin 250 mg in sodium chloride 0.9 % 100 mL IVPB        250 mg 100 mL/hr over 60 Minutes Intravenous Every 8 hours 11/10/19 0907 11/11/19 2000   11/07/19 1200  ceFEPIme (MAXIPIME) 2 g in sodium chloride 0.9 % 100 mL IVPB        2 g 200 mL/hr over 30 Minutes Intravenous Every 8 hours 11/07/19 1013 11/13/19 2127   11/03/19 1200  cefTRIAXone (ROCEPHIN) 2 g in sodium chloride 0.9 % 100 mL IVPB        2 g 200 mL/hr over 30 Minutes Intravenous Every 24 hours 11/03/19 0922 11/05/19 1427   11/02/19 0800  vancomycin (VANCOREADY) IVPB 750 mg/150 mL  Status:  Discontinued        750 mg 150 mL/hr over 60 Minutes Intravenous Every 12 hours 11/01/19 1935 11/02/19 1105   11/02/19 0600  piperacillin-tazobactam (ZOSYN) IVPB 3.375 g  Status:  Discontinued        3.375 g 12.5 mL/hr over 240 Minutes Intravenous Every 8  hours 11/02/19 0311 11/03/19 0920   11/01/19 2200  ceFEPIme (MAXIPIME) 2 g in sodium chloride 0.9 % 100 mL IVPB  Status:  Discontinued        2 g 200 mL/hr over 30 Minutes Intravenous Every 12 hours 11/01/19 2143 11/02/19 0311   11/01/19 1915  vancomycin (VANCOREADY) IVPB 1500 mg/300 mL        1,500 mg 150 mL/hr over 120 Minutes Intravenous  Once 11/01/19 1909 11/01/19 2147   11/01/19 1845  cefTRIAXone (ROCEPHIN) 1 g in sodium chloride 0.9 % 100 mL IVPB        1 g 200 mL/hr over 30 Minutes Intravenous  Once 11/01/19 1842 11/01/19 2051   11/01/19 1845  azithromycin (ZITHROMAX) 500 mg in sodium chloride 0.9 % 250 mL IVPB        500 mg 250 mL/hr over 60 Minutes Intravenous  Once 11/01/19 1842 11/01/19 2051       Time spent: 20 minutes    Erin Hearing ANP  [facility]  7 am-330 pm/M-F 02/05/2020, 11:45 AM  LOS: 96 days

## 2020-02-05 NOTE — Progress Notes (Signed)
Physical Therapy Treatment Patient Details Name: Ryan Orozco MRN: [id number] DOB: [birth year] Today's Date: 02/05/2020    History of Present Illness 55 year old male found down outside convenience store 9/27. Pt with PEA and hypoxic brain injury. Trach 10/13, PEG 10/15. PMHx: HIV AIDS and alcohol abuse    PT Comments    Pt with increased LLE flexor tone this session in fetal position in supine in bed. Pt able to be lifted to chair with decreased flexion in sitting with LLE able to achieve full knee extension and 90 degree hip flexion. Right LE improved to 90/90 hip/knee flexion in chair. Pt worked on command following and trunk positioning then took a ride around the unit in recliner prior to return to bed. Will attempt to work toward increased mobility and chair tolerance as spasticity is addressed by medical staff to allow greater movement and safety in sitting.    Follow Up Recommendations  SNF     Equipment Recommendations  Hospital bed    Recommendations for Other Services       Precautions / Restrictions Precautions Precaution Comments: trach, PEG, anoxic, flexor tone R>L LE/UE    Mobility  Bed Mobility Overal bed mobility: Needs Assistance Bed Mobility: Rolling;Supine to Sit;Sit to Supine Rolling: Total assist;+2 for physical assistance;+2 for safety/equipment   Supine to sit: Total assist;+2 for physical assistance;+2 for safety/equipment Sit to supine: Total assist;+2 for physical assistance;+2 for safety/equipment   General bed mobility comments: total +2 assist to roll bil for pad placement and removal. Lift with maximove from bed<>recliner  Transfers Overall transfer level: Needs assistance               General transfer comment: total assist with maximove bed<>recliner  Ambulation/Gait             General Gait Details: unable   Stairs             Wheelchair Mobility    Modified Rankin (Stroke Patients Only)       Balance  Overall balance assessment: Needs assistance   Sitting balance-Leahy Scale: Zero Sitting balance - Comments: able to lean left and right at times in sitting on command                                    Cognition Arousal/Alertness: Awake/alert Behavior During Therapy: [facility] for tasks assessed/performed Overall Cognitive Status: Difficult to assess Area of Impairment: Following commands;Attention               Rancho Levels of Cognitive Functioning Ranch[place] Amigos Scales of Cognitive Functioning: Localized response   Current Attention Level: Sustained   Following Commands: Follows one step commands inconsistently;Follows one step commands with increased time     Problem Solving: Slow processing;Requires verbal cues;Requires tactile cues General Comments: pt able to follow command grossly 25% of the time for shifting weight once in chair. Pt able to lean back and to left on command with increased time. No other command following noted. Pt nonverbal other than "hey"      Exercises      General Comments        Pertinent Vitals/Pain Faces Pain Scale: Hurts even more Pain Location: RLE with stretching (high tone) Pain Descriptors / Indicators: Grimacing;Guarding;Tender;Moaning Pain Intervention(s): Limited activity within patient's tolerance;Monitored during session;Repositioned    Home Living  Prior Function            PT Goals (current goals can now be found in the care plan section) Acute Rehab PT Goals Time For Goal Achievement: 02/19/20 Potential to Achieve Goals: Fair Additional Goals Additional Goal #1: Pt will tolerate OOB in chair for 1 hours. Progress towards PT goals: Not progressing toward goals - comment    Frequency    Min 2X/week      PT Plan Current plan remains appropriate    Co-evaluation PT/OT/SLP Co-Evaluation/Treatment: Yes Reason for Co-Treatment: Complexity of the patient's impairments  (multi-system involvement);For patient/therapist safety PT goals addressed during session: Mobility/safety with mobility;Strengthening/ROM        AM-PAC PT "6 Clicks" Mobility   Outcome Measure  Help needed turning from your back to your side while in a flat bed without using bedrails?: Total Help needed moving from lying on your back to sitting on the side of a flat bed without using bedrails?: Total Help needed moving to and from a bed to a chair (including a wheelchair)?: Total Help needed standing up from a chair using your arms (e.g., wheelchair or bedside chair)?: Total Help needed to walk in hospital room?: Total Help needed climbing 3-5 steps with a railing? : Total 6 Click Score: 6    End of Session   Activity Tolerance: Patient tolerated treatment well Patient left: in bed;with call bell/phone within reach Nurse Communication: Mobility status;Need for lift equipment PT Visit Diagnosis: Other abnormalities of gait and mobility (R26.89);Muscle weakness (generalized) (M62.81)     Time: 6314-9702 PT Time Calculation (min) (ACUTE ONLY): 41 min  Charges:  $Therapeutic Activity: 23-37 mins                     Alison Kubicki P, PT Acute Rehabilitation Services Pager: [phone] Office: [phone]    Whyatt Klinger B Deshane Cotroneo 02/05/2020, 1:47 PM

## 2020-02-06 LAB — GLUCOSE, CAPILLARY
Glucose-Capillary: 67 mg/dL — ABNORMAL LOW (ref 70–99)
Glucose-Capillary: 170 mg/dL — ABNORMAL HIGH (ref 70–99)
Glucose-Capillary: 110 mg/dL — ABNORMAL HIGH (ref 70–99)
Glucose-Capillary: 103 mg/dL — ABNORMAL HIGH (ref 70–99)
Glucose-Capillary: 78 mg/dL (ref 70–99)
Glucose-Capillary: 173 mg/dL — ABNORMAL HIGH (ref 70–99)
Glucose-Capillary: 81 mg/dL (ref 70–99)
Glucose-Capillary: 192 mg/dL — ABNORMAL HIGH (ref 70–99)

## 2020-02-06 MED ORDER — TIZANIDINE HCL 4 MG PO TABS
6.0000 mg | ORAL_TABLET | Freq: Two times a day (BID) | ORAL | Status: DC
  Administered 2020-02-06 – 2020-02-12 (×14): 6 mg
  Filled 2020-02-06 (×15): qty 1

## 2020-02-06 MED ORDER — TIZANIDINE HCL 4 MG PO TABS
4.0000 mg | ORAL_TABLET | Freq: Two times a day (BID) | ORAL | Status: DC
  Administered 2020-02-06 – 2020-02-13 (×14): 4 mg
  Filled 2020-02-06 (×14): qty 1

## 2020-02-06 MED ORDER — ONABOTULINUMTOXINA 100 UNITS IJ SOLR
400.0000 [IU] | Freq: Once | INTRAMUSCULAR | Status: AC
  Administered 2020-02-08: 400 [IU] via INTRAMUSCULAR
  Filled 2020-02-06: qty 400

## 2020-02-06 NOTE — Progress Notes (Signed)
[place] [facility]INE [facility] RE[facility]ABILITATION  CONSULT SERVICE NOTE    Pt seen in follow up today. Presents with significant, ongoing flexor tone in LE's, R>>L. Right hamstrings appear most involved. 3-4/4 as are hip flexors 3-4/4. LLE in flexion also today  Plan: Increase tizanidine to 4-6-4-6mg  (q6 hours) Will order botox 400u for right hamstrings and quads to be performed on Thursday morning. Will order botox today so that pharmacy has available for Thursday.  Spoke with [name] NP re: plan   Ranelle Oyster, MD, [facility] Physical Medicine & Rehabilitation 02/06/2020

## 2020-02-06 NOTE — TOC Progression Note (Addendum)
Transition of Care [facility]) - Progression Note    Patient Details  Name: Leondre Taul MRN: [id number] Date of Birth: [birth year]  Transition of Care [facility]) CM/SW Contact  Janae Bridgeman, RN Phone Number: 02/06/2020, 12:01 PM  Clinical Narrative:    Case management called and spoke with Cicero Duck, CM at [facility] and faxed clinicals to the facility at [phone] in regards to possible bed availability and placement.  Also, left a message with [facility] rehab and faxed clinicals to the facility as well for possible admission potential for this patient.  Both of these facilities have trach/ vent care capability.  CM will continue to follow for possible SNF placement.  02/06/2020- 1224- Case management spoke with Cicero Duck at [facility] in [place] and they are unwilling to review this patient at this time since he does not have established Medicare in place at this point.  Macario Golds, MSW was notified to assist with admission assistance in regards to [facility] and/or pending disability.  Expected Discharge Plan: Skilled Nursing Facility Barriers to Discharge: Continued Medical Work up,SNF Pending bed offer  Expected Discharge Plan and Services Expected Discharge Plan: Skilled Nursing Facility   Discharge Planning Services: CM Consult Post Acute Care Choice: Nursing Home Living arrangements for the past 2 months: Single Family Home                                       Social Determinants of Health (SDOH) Interventions    Readmission Risk Interventions Readmission Risk Prevention Plan 02/06/2020  Transportation Screening Complete  PCP or Specialist Appt within 3-5 Days Complete  HRI or Home Care Consult Complete  Social Work Consult for Recovery Care Planning/Counseling Complete  Palliative Care Screening Complete  Medication Review Oceanographer) Complete  Some recent data might be hidden

## 2020-02-06 NOTE — Progress Notes (Signed)
At 09:00 this nurse placed the wrist braces on the pt and took them off at 13:30.

## 2020-02-06 NOTE — Progress Notes (Addendum)
[facility] PROGRESS NOTE  Ryan Orozco DXI:[id number] DOB: 1965-10-16 DOA: 11/01/2019 PCP: Default, Provider, MD    11/16   Status: Remains inpatient appropriate because:Altered mental status, Unsafe d/c plan and Inpatient level of care appropriate due to severity of illness. Patient newly diagnosed with anoxic brain injury  Dispo: The patient is from: Home              Anticipated d/c is to: SNF              Anticipated d/c date is: > 3 days              Patient currently is medically stable to d/c.  Barriers to discharge: No SNF bed offers.  Needs trach capable facility. TOC communicating with financial counseling team who have yet to hear back from DSS regarding patient's assigned caseworker.  TOC spoke with [facility] rehab.  Patient would be an excellent candidate but they request he have active Medicaid and disability before they can accept him.   Code Status: Full Family Communication: 12/29 dtr Ryan Orozco updated DVT prophylaxis: Subcutaneous heparin Vaccination status: Covid vaccination status unknown  Foley catheter: No  HPI: 55 year old male with HIV, chronic alcohol abuse who was presented to the emergency department after being found unresponsive outside a convinient store.  EMS found him pulseless in PEA, unknown downtime, successfully resuscitated and brought to the emergency department.  UDS positive for THC and benzodiazepines.  CT head unremarkable.  Failed extubation trials due to severe anoxic brain injury and had tracheostomy placed.  PEG placed on 10/15.  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  This has led to significant pain as well.  Dr. Franchot Orozco was consulted and recommendations/input/orders highly appreciated.  She may also benefit from Botox injections to both hips to aid in treatment of spasticity.   Significant Hospital Events   9/29 Admit post PEA arrest. UDS positive  for THC, benzo's. ETOH 177 10/05 EEG ongoing. Versed restarted overnight due to agitation. Nicardipine increased.  10/06 Developed tachypnea with WUA, no follow commands off sedation  10/07 Vomiting, TF held / restarted with recurrent vomiting 10/16 tracheostomy collar for 9 hours 10/18->10/19 off vent all night  10/20> TC 11/15 > downsized to #4 Shiley flex CFS  Subjective: Awakened.  Still seems uncomfortable.  Noted with both hips flexed at the knee.  Visibly uncomfortable with attempts to extend the left leg.  Objective: Vitals:   02/06/20 0517 02/06/20 0830  BP: 117/75   Pulse: (!) 105 89  Resp: 20 16  Temp: 99.1 F (37.3 C)   SpO2: 96% 98%    Intake/Output Summary (Last 24 hours) at 02/06/2020 1001 Last data filed at 02/06/2020 0416 Gross per 24 hour  Intake --  Output 500 ml  Net -500 ml   Filed Weights   01/23/20 0700 01/28/20 0500 02/02/20 0954  Weight: 74.1 kg 74.5 kg 71.6 kg    Exam: General: Awakened, calm but does appear to be uncomfortable. Pulmonary: Trach collar with humidity/room air.  Bilateral lung sounds clear to auscultation decreased in bases.  Pulse oximetry 98% Cardiac: Heart sounds S1-S2 without murmur.  No peripheral edema.  Pulses regular nontachycardic. Abdomen: Soft, nondistended and non-tender, normoactive bowel sounds.  PEG tube - LBM 1/02 Neurological: Awakened dystonic movements involving the upper extremities w/ purposeful movement more so with left upper extremity movement is gross motor in etiology and originates from the shoulders.  Keeps both legs flexed at  hips and knees and has developed increasing hypertonicity in left leg; significant hypertonicity and flexion of RLE at knee and hip.  Assessment/Plan: Acute problems: Anoxic brain injury 2/2 PEA arrest/pain syndrome secondary to hypertonicity/spastic tetraplegia:  -Presumed 2/2 combination alcohol and BZD overdose with UDS positive for THC and BZDs -Significant brain injury with  underlying spastic tetraplegia long-term SNF placement recommended-continue PT/OT/SLP -Continue baclofen, gabapentin, scheduled oxycodone and Zanaflex  -Appreciate assistance of Dr. Natasha Orozco is for Botox injection on 1/5.  Also he is tapering tizanidine dosage. -Continue bilateral upper extremity WHO resting splints along with PROM per nursing staff every 4 hours -Continue low-dose Seroquel for brain injury sequela -Continue scheduled Tylenol for pain along with Crea muscle rub -Continue KREG rotational bed   Enterococcus faecalis UTI -Urine culture positive for pansensitive Enterococcus-empiric Levaquin discontinued on 12/30 in favor of Augmentin and will receive a total of 5 days therapy  Chronic respiratory failure with inability to maintain patent airway requiring chronic tracheostomy tube/Recent Corynebacterium tracheobronchitis (resolved) -Continue PMV training per SLP-per SLP did not do well with attempts to introduce oral substances12 /2;  #4 cuffless trach in place -Trach team signed off as of 12/27 given not a candidate for decannulation until patient can reliably follow commands, cough and clear airway. -continue physiotherapy with Vibra vest along with supervised PMV trials  HIV:  -CD4 count of 124 October 2021 (had been as high as 250 Dec 2020)-as of today up to 259 -Dc'd Tivicay and Descovy infavor of Biktarvy to for eventual dc to SNF; per my discussion with infectious disease physician Dr. Baxter Orozco on 12/23 preferred regimen would be Descovy and Tivicay-she will contact the HIV pharmacist to attempt to obtain discount cards to make this a more affordable option.  Once this has been confirmed will transition back to Descovy and Tivicay -CD4 count on 12/23 up to 259 -ID recommends continuing prophylactic Bactrim an additional 3 months  Dysphagia 2/2 anoxic brain injury -Continue tube feeding per PEG -SLP attempted to introduce oral substances on 12/27 but patient did not accept  and was pushing out with his tongue.  Given his acute UTI he may have been nauseated and this may have been reason for him not wishing to eat.  Goals of care:  -Palliative care was involved during this hospitalization.   -Goals of care were discussed. remains full code.  -Palliative care recommended outpatient follow-up with palliative providers. -May need to revisit end-of-life goals of care since patient making poor progress and currently having significant pain related to hypertonicity from brain injury  Protein calorie malnutrition nutrition Status: Nutrition Problem: Increased nutrient needs Etiology: acute illness Signs/Symptoms: estimated needs Interventions: Tube feeding bolus doses of Osmolite, Juven twice daily, Prosource TF 45 mL 3 times daily Estimated body mass index is 25.47 kg/m as calculated from the following:   Height as of this encounter: _0  (1.676 m).   Weight as of this encounter: 71.6 kg.   Multiple decubiti not POA Wound / Incision (Open or Dehisced) 11/17/19 Puncture Abdomen Left;Anterior;Upper G-tube insertion site  (Active)  Date First Assessed/Time First Assessed: 11/17/19 1646   Wound Type: Puncture  Location: Abdomen  Location Orientation: Left;Anterior;Upper  Wound Description (Comments): G-tube insertion site   Present on Admission: No    Assessments 11/17/2019  4:47 PM 02/05/2020  8:40 PM  Dressing Type Gauze (Comment);Tape dressing Gauze (Comment)  Dressing Changed New --  Dressing Status Clean;Dry;Intact Clean;Dry;Intact  Dressing Change Frequency PRN PRN  Site / Wound Assessment Clean;Dry;Pink --  Peri-wound Assessment Intact --  Closure None --  Drainage Amount None --  Treatment Cleansed --     No Linked orders to display      Other problems: Hypertension/grade 1 diastolic dysfunction:  -Continue amlodipine  -Echocardiogram this admission with preserved LVEF with evidence of grade   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   -Continue Keppra; levetiracetam level 35.9 on 11/12 -no further seizure activity since transitioned out of ICU setting therefore will discontinue seizure precaution  Inguinal hernias -Evaluated by general surgery this admission. Documented as chronically incarcerated. Patient has been clinically stable and tolerating tube feedings and having bowel movements so unless patient obstructed would not be considered a candidate for repair. Even if obstructed consulting surgeon was not sure he would be a candidate for hernia repair regardless .   Data Reviewed: Basic Metabolic Panel: No results for input(s): NA, K, CL, CO2, GLUCOSE, BUN, CREATININE, CALCIUM, MG, PHOS in the last 168 hours. Liver Function Tests: No results for input(s): AST, ALT, ALKPHOS, BILITOT, PROT, ALBUMIN in the last 168 hours. No results for input(s): LIPASE, AMYLASE in the last 168 hours. No results for input(s): AMMONIA in the last 168 hours. CBC: No results for input(s): WBC, NEUTROABS, HGB, HCT, MCV, PLT in the last 168 hours. Cardiac Enzymes: No results for input(s): CKTOTAL, CKMB, CKMBINDEX, TROPONINI in the last 168 hours. BNP (last 3 results) No results for input(s): BNP in the last 8760 hours.  ProBNP (last 3 results) No results for input(s): PROBNP in the last 8760 hours.  CBG: Recent Labs  Lab 02/05/20 1226 02/05/20 1530 02/05/20 2259 02/06/20 0305 02/06/20 0736  GLUCAP 75 240* 114* 110* 103*    Recent Results (from the past 240 hour(s))  Urine Culture     Status: Abnormal   Collection Time: 01/30/20  5:50 PM   Specimen: Urine, Clean Catch  Result Value Ref Range Status   Specimen Description URINE, CLEAN CATCH  Final   Special Requests   Final    Normal Performed at [facility], [place] [address], [place], [place] [address]    Culture 80,000 COLONIES/mL ENTEROCOCCUS FAECALIS (A)  Final   Report Status 02/02/2020 FINAL  Final   Organism ID, Bacteria ENTEROCOCCUS FAECALIS (A)   Final      Susceptibility   Enterococcus faecalis - MIC*    AMPICILLIN <=2 SENSITIVE Sensitive     NITROFURANTOIN <=16 SENSITIVE Sensitive     VANCOMYCIN 1 SENSITIVE Sensitive     * 80,000 COLONIES/mL ENTEROCOCCUS FAECALIS     Studies: No results found.  Scheduled Meds: . acetaminophen (TYLENOL) oral liquid 160 mg/5 mL  650 mg Per Tube Q6H  . amLODipine  10 mg Per Tube Daily  . amoxicillin-clavulanate  500 mg Per Tube Q8H  . baclofen  30 mg Per Tube TID  . chlorhexidine gluconate (MEDLINE KIT)  15 mL Mouth Rinse BID  . Darunavir-Cobicisctat-Emtricitabine-Tenofovir Alafenamide  1 tablet Oral Q breakfast  . famotidine  10.4 mg Per Tube BID  . feeding supplement (OSMOLITE 1.5 CAL)  237 mL Per Tube Q24H  . feeding supplement (OSMOLITE 1.5 CAL)  474 mL Per Tube TID  . feeding supplement (PROSource TF)  45 mL Per Tube TID  . fiber  1 packet Per Tube BID  . folic acid  1 mg Per Tube Daily  . free water  200 mL Per Tube Q4H  . gabapentin  200 mg Per Tube Q8H  . heparin injection (subcutaneous)  5,000 Units Subcutaneous Q8H  . levETIRAcetam  1,500 mg Per Tube BID  . Muscle Rub   Topical QID  . oxyCODONE  5 mg Per Tube Q8H  . QUEtiapine  12.5 mg Per Tube QHS  . scopolamine  1 patch Transdermal Q72H  . sulfamethoxazole-trimethoprim  1 tablet Per Tube Once per day on Mon Wed Fri  . thiamine  100 mg Per Tube Daily  . tiZANidine  4 mg Per Tube QID   Continuous Infusions:   Active Problems:   Cardiac arrest [facility])   Community acquired pneumonia of left upper lobe of lung   Anoxic brain injury ([place])   Alcohol abuse   Acute respiratory failure with hypoxia ([facility])   Vomiting   Pressure injury of skin   Small bowel obstruction ([place])   Palliative care encounter   Bowel obstruction ([facility])   Chronic respiratory failure ([facility])   Diastolic dysfunction   Tracheostomy dependent ([place])   Dysphagia   Protein-calorie malnutrition ([place])   Seizures ([place])   Bilateral recurrent inguinal hernia    Muscle spasticity   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[facility])   Tracheobronchitis   Sequela of Corynebacterium infection   Abnormal urinalysis   Urinary tract infection due to Enterococcus   Consultants:  PCCM  Palliative medicine  General surgery  Neurology  Interventional radiology  Procedures:  EEG  Echocardiogram  Tracheostomy  Antibiotics: Anti-infectives (From admission, onward)   Start     Dose/Rate Route Frequency Ordered Stop   02/01/20 2200  amoxicillin-clavulanate (AUGMENTIN) 250-62.5 MG/5ML suspension 500 mg        500 mg Per Tube Every 8 hours 02/01/20 1343 02/06/20 2159   01/31/20 1015  levofloxacin (LEVAQUIN) 25 MG/ML solution 500 mg  Status:  Discontinued        500 mg Per Tube Daily 01/31/20 0919 02/01/20 1343   01/31/20 1000  cefTRIAXone (ROCEPHIN) 1 g in sodium chloride 0.9 % 100 mL IVPB  Status:  Discontinued        1 g 200 mL/hr over 30 Minutes Intravenous Every 24 hours 01/31/20 0905 01/31/20 0919   01/19/20 2200  linezolid (ZYVOX) tablet 600 mg        600 mg Per Tube Every 12 hours 01/19/20 1534 01/25/20 2019   01/19/20 1100  levofloxacin (LEVAQUIN) tablet 500 mg  Status:  Discontinued        500 mg Per Tube Daily 01/19/20 1004 01/19/20 1534   12/19/19 0930  Darunavir-Cobicisctat-Emtricitabine-Tenofovir Alafenamide (SYMTUZA) 800-150-200-10 MG TABS 1 tablet        1 tablet Oral Daily with breakfast 12/19/19 0840     12/13/19 1000  bictegravir-emtricitabine-tenofovir AF (BIKTARVY) 50-200-25 MG per tablet 1 tablet  Status:  Discontinued        1 tablet Oral Daily 12/12/19 1413 12/19/19 0840   11/17/19 1548  ceFAZolin (ANCEF) 2-4 GM/100ML-% IVPB       Note to Pharmacy: Domenick Bookbinder   : cabinet override      11/17/19 1548 11/18/19 0359   11/16/19 1515  ceFAZolin (ANCEF) IVPB 2g/100 mL premix        2 g 200 mL/hr over 30 Minutes Intravenous To Radiology 11/16/19 1511 11/17/19 1625   11/15/19 0900  sulfamethoxazole-trimethoprim (BACTRIM  DS) 800-160 MG per tablet 1 tablet        1 tablet Per Tube Once per day on Mon Wed Fri 11/13/19 1906     11/13/19 1615  dolutegravir (TIVICAY) tablet 50 mg  Status:  Discontinued  50 mg Oral Daily 11/13/19 1521 12/12/19 1413   11/13/19 1615  emtricitabine-tenofovir AF (DESCOVY) 200-25 MG per tablet 1 tablet  Status:  Discontinued        1 tablet Per Tube Daily 11/13/19 1521 12/12/19 1413   11/13/19 1530  sulfamethoxazole-trimethoprim (BACTRIM DS) 800-160 MG per tablet 1 tablet  Status:  Discontinued        1 tablet Oral Once per day on Mon Wed Fri 11/13/19 1441 11/13/19 1906   11/13/19 1500  bictegravir-emtricitabine-tenofovir AF (BIKTARVY) 50-200-25 MG per tablet 1 tablet  Status:  Discontinued        1 tablet Oral Daily 11/13/19 1403 11/13/19 1521   11/10/19 1000  erythromycin 250 mg in sodium chloride 0.9 % 100 mL IVPB        250 mg 100 mL/hr over 60 Minutes Intravenous Every 8 hours 11/10/19 0907 11/11/19 2000   11/07/19 1200  ceFEPIme (MAXIPIME) 2 g in sodium chloride 0.9 % 100 mL IVPB        2 g 200 mL/hr over 30 Minutes Intravenous Every 8 hours 11/07/19 1013 11/13/19 2127   11/03/19 1200  cefTRIAXone (ROCEPHIN) 2 g in sodium chloride 0.9 % 100 mL IVPB        2 g 200 mL/hr over 30 Minutes Intravenous Every 24 hours 11/03/19 0922 11/05/19 1427   11/02/19 0800  vancomycin (VANCOREADY) IVPB 750 mg/150 mL  Status:  Discontinued        750 mg 150 mL/hr over 60 Minutes Intravenous Every 12 hours 11/01/19 1935 11/02/19 1105   11/02/19 0600  piperacillin-tazobactam (ZOSYN) IVPB 3.375 g  Status:  Discontinued        3.375 g 12.5 mL/hr over 240 Minutes Intravenous Every 8 hours 11/02/19 0311 11/03/19 0920   11/01/19 2200  ceFEPIme (MAXIPIME) 2 g in sodium chloride 0.9 % 100 mL IVPB  Status:  Discontinued        2 g 200 mL/hr over 30 Minutes Intravenous Every 12 hours 11/01/19 2143 11/02/19 0311   11/01/19 1915  vancomycin (VANCOREADY) IVPB 1500 mg/300 mL        1,500 mg 150 mL/hr  over 120 Minutes Intravenous  Once 11/01/19 1909 11/01/19 2147   11/01/19 1845  cefTRIAXone (ROCEPHIN) 1 g in sodium chloride 0.9 % 100 mL IVPB        1 g 200 mL/hr over 30 Minutes Intravenous  Once 11/01/19 1842 11/01/19 2051   11/01/19 1845  azithromycin (ZITHROMAX) 500 mg in sodium chloride 0.9 % 250 mL IVPB        500 mg 250 mL/hr over 60 Minutes Intravenous  Once 11/01/19 1842 11/01/19 2051       Time spent: 20 minutes    Erin Hearing ANP  [facility]  7 am-330 pm/M-F 02/06/2020, 10:01 AM  LOS: 97 days

## 2020-02-07 LAB — GLUCOSE, CAPILLARY
Glucose-Capillary: 97 mg/dL (ref 70–99)
Glucose-Capillary: 92 mg/dL (ref 70–99)
Glucose-Capillary: 178 mg/dL — ABNORMAL HIGH (ref 70–99)
Glucose-Capillary: 113 mg/dL — ABNORMAL HIGH (ref 70–99)
Glucose-Capillary: 119 mg/dL — ABNORMAL HIGH (ref 70–99)
Glucose-Capillary: 103 mg/dL — ABNORMAL HIGH (ref 70–99)

## 2020-02-07 NOTE — Progress Notes (Signed)
[facility] PROGRESS NOTE  Ryan Orozco MRN:[id number] DOB: 05/13/1965 DOA: 11/01/2019 PCP: Default, Provider, MD    11/16   Status: Remains inpatient appropriate because:Altered mental status, Unsafe d/c plan and Inpatient level of care appropriate due to severity of illness. Patient newly diagnosed with anoxic brain injury  Dispo: The patient is from: Home              Anticipated d/c is to: SNF              Anticipated d/c date is: > 3 days              Patient currently is medically stable to d/c.  Barriers to discharge: No SNF bed offers.  Needs trach capable facility. TOC communicating with financial counseling team who have yet to hear back from DSS regarding patient's assigned caseworker.  T[facility] spoke with [facility].  Patient would be an excellent candidate but they request he have active Medicaid and disability before they can accept him.   Code Status: Full Family Communication: 12/29 dtr [name] updated DVT prophylaxis: Subcutaneous heparin Vaccination status: Covid vaccination status unknown  Foley catheter: No  HPI: 54-year-old male with HIV, chronic alcohol abuse who was presented to the emergency department after being found unresponsive outside a convinient store.  EMS found him pulseless in PEA, unknown downtime, successfully resuscitated and brought to the emergency department.  UDS positive for THC and benzodiazepines.  CT head unremarkable.  Failed extubation trials due to severe anoxic brain injury and had tracheostomy placed.  PEG placed on 10/15.  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  This has led to significant pain as well.  Dr. Swartz/CIR was consulted and recommendations/input/orders highly appreciated.  She may also benefit from Botox injections to both hips to aid in treatment of spasticity.   Significant Hospital Events   9/29 Admit post PEA arrest. UDS positive  for THC, benzo's. ETOH 177 10/05 EEG ongoing. Versed restarted overnight due to agitation. Nicardipine increased.  10/06 Developed tachypnea with WUA, no follow commands off sedation  10/07 Vomiting, TF held / restarted with recurrent vomiting 10/16 tracheostomy collar for 9 hours 10/18->10/19 off vent all night  10/20> TC 11/15 > downsized to #4 Shiley flex CFS  Subjective: Awake.  At bedside performing PROM.  Patient smiling very interactive today.  Nurse reports that yesterday evening patient was much less stiff in context of regular PROM.  Suspect increased a.m. tone secondary to lack of PROM to allow patient to sleep  Objective: Vitals:   02/07/20 0517 02/07/20 0721  BP: 124/76   Pulse: 96 81  Resp: 18 16  Temp: 98.9 F (37.2 C)   SpO2: 97%     Intake/Output Summary (Last 24 hours) at 02/07/2020 1109 Last data filed at 02/07/2020 1100 Gross per 24 hour  Intake -  Output 1100 ml  Net -1100 ml   Filed Weights   01/28/20 0500 02/02/20 0954 02/07/20 0537  Weight: 74.5 kg 71.6 kg 73 kg    Exam: General: Awake, calm, smiling, interactive Pulmonary: Minified room air via trach collar, bilateral lung sounds clear to auscultation decreased in bases.  Pulse oximetry 98% Cardiac: Regular pulse, heart sounds S1-S2 without murmur.  No peripheral edema.   Abdomen: Soft, nondistended and non-tender, normoactive bowel sounds.  PEG tube - LBM 1/04 Neurological: Awake interactive today.  Persistent dystonic movements involving the upper extremities w/ purposeful movement more so with left upper   extremity movement is gross motor in etiology and originates from the shoulders.  Early a.m. hypertonicity reported by nursing staff.  Keeps both legs flexed at hips and knees and has developed increasing hypertonicity in left leg; significant hypertonicity and flexion of RLE at knee and hip.  Assessment/Plan: Acute problems: Anoxic brain injury 2/2 PEA arrest/pain syndrome secondary to  hypertonicity/spastic tetraplegia:  -Presumed 2/2 combination alcohol and BZD overdose with UDS positive for THC and BZDs -Significant brain injury with underlying spastic tetraplegia long-term SNF placement recommended-continue PT/OT/SLP -Continue baclofen, gabapentin, scheduled oxycodone and Zanaflex  -Appreciate assistance of Dr. Swartz-plan is for Botox injection on 1/6.  Also he is uptitrating tizanidine dosage. -Continue bilateral upper extremity WHO resting splints along with PROM per nursing staff every 4 hours -Continue low-dose Seroquel for brain injury sequela -Continue scheduled Tylenol for pain along with Crea muscle rub -Continue KREG rotational bed   Enterococcus faecalis UTI -Urine culture positive for pansensitive Enterococcus-empiric Levaquin discontinued on 12/30 in favor of Augmentin and will receive a total of 5 days therapy  Chronic respiratory failure with inability to maintain patent airway requiring chronic tracheostomy tube/Recent Corynebacterium tracheobronchitis (resolved) -Continue PMV training per SLP-per SLP did not do well with attempts to introduce oral substances12 /2;  #4 cuffless trach in place -Trach team signed off as of 12/27 given not a candidate for decannulation until patient can reliably follow commands, cough and clear airway. -continue physiotherapy with Vibra vest along with supervised PMV trials  HIV:  -CD4 count of 124 October 2021 (had been as high as 250 Dec 2020)-as of today up to 259 -Dc'd Tivicay and Descovy infavor of Biktarvy to for eventual dc to SNF; per my discussion with infectious disease physician Dr. Snider on 12/23 preferred regimen would be Descovy and Tivicay-she will contact the HIV pharmacist to attempt to obtain discount cards to make this a more affordable option.  Once this has been confirmed will transition back to Descovy and Tivicay -CD4 count on 12/23 up to 259 -ID recommends continuing prophylactic Bactrim an additional 3  months  Dysphagia 2/2 anoxic brain injury -Continue tube feeding per PEG -SLP attempted to introduce oral substances on 12/27 but patient did not accept and was pushing out with his tongue.  Given his acute UTI he may have been nauseated and this may have been reason for him not wishing to eat.  Goals of care:  -Palliative care was involved during this hospitalization.   -Goals of care were discussed. remains full code.  -Palliative care recommended outpatient follow-up with palliative providers. -May need to revisit end-of-life goals of care since patient making poor progress and currently having significant pain related to hypertonicity from brain injury  Protein calorie malnutrition nutrition Status: Nutrition Problem: Increased nutrient needs Etiology: acute illness Signs/Symptoms: estimated needs Interventions: Tube feeding bolus doses of Osmolite, Juven twice daily, Prosource TF 45 mL 3 times daily Estimated body mass index is 25.97 kg/m as calculated from the following:   Height as of this encounter: 5' 6" (1.676 m).   Weight as of this encounter: 73 kg.   Multiple decubiti not POA Wound / Incision (Open or Dehisced) 11/17/19 Puncture Abdomen Left;Anterior;Upper G-tube insertion site  (Active)  Date First Assessed/Time First Assessed: 11/17/19 1646   Wound Type: Puncture  Location: Abdomen  Location Orientation: Left;Anterior;Upper  Wound Description (Comments): G-tube insertion site   Present on Admission: No    Assessments 11/17/2019  4:47 PM 02/06/2020  9:48 PM  Dressing Type Gauze (Comment);Tape   dressing Gauze (Comment)  Dressing Changed New -  Dressing Status Clean;Dry;Intact Clean;Dry;Intact  Dressing Change Frequency PRN PRN  Site / Wound Assessment Clean;Dry;Pink Clean;Dry  Peri-wound Assessment Intact -  Closure None -  Drainage Amount None -  Treatment Cleansed -     No Linked orders to display      Other problems: Hypertension/grade 1 diastolic dysfunction:   -Continue amlodipine  -Echocardiogram this admission with preserved LVEF with evidence of grade   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   -Continue Keppra; levetiracetam level 35.9 on 11/12 -no further seizure activity since transitioned out of ICU setting therefore will discontinue seizure precaution  Inguinal hernias -Evaluated by general surgery this admission. Documented as chronically incarcerated. Patient has been clinically stable and tolerating tube feedings and having bowel movements so unless patient obstructed would not be considered a candidate for repair. Even if obstructed consulting surgeon was not sure he would be a candidate for hernia repair regardless .   Data Reviewed: Basic Metabolic Panel: No results for input(s): NA, K, CL, CO2, GLUCOSE, BUN, CREATININE, CALCIUM, MG, PHOS in the last 168 hours. Liver Function Tests: No results for input(s): AST, ALT, ALKPHOS, BILITOT, PROT, ALBUMIN in the last 168 hours. No results for input(s): LIPASE, AMYLASE in the last 168 hours. No results for input(s): AMMONIA in the last 168 hours. CBC: No results for input(s): WBC, NEUTROABS, HGB, HCT, MCV, PLT in the last 168 hours. Cardiac Enzymes: No results for input(s): CKTOTAL, CKMB, CKMBINDEX, TROPONINI in the last 168 hours. BNP (last 3 results) No results for input(s): BNP in the last 8760 hours.  ProBNP (last 3 results) No results for input(s): PROBNP in the last 8760 hours.  CBG: Recent Labs  Lab 02/06/20 1553 02/06/20 2031 02/06/20 2306 02/07/20 0343 02/07/20 0801  GLUCAP 173* 81 192* 97 92    Recent Results (from the past 240 hour(s))  Urine Culture     Status: Abnormal   Collection Time: 01/30/20  5:50 PM   Specimen: Urine, Clean Catch  Result Value Ref Range Status   Specimen Description URINE, CLEAN CATCH  Final   Special Requests   Final    Normal Performed at [facility], [address] [place], [place] [address]     Culture 80,000 COLONIES/mL ENTEROCOCCUS FAECALIS (A)  Final   Report Status 02/02/2020 FINAL  Final   Organism ID, Bacteria ENTEROCOCCUS FAECALIS (A)  Final      Susceptibility   Enterococcus faecalis - MIC*    AMPICILLIN <=2 SENSITIVE Sensitive     NITROFURANTOIN <=16 SENSITIVE Sensitive     VANCOMYCIN 1 SENSITIVE Sensitive     * 80,000 COLONIES/mL ENTEROCOCCUS FAECALIS     Studies: No results found.  Scheduled Meds: . acetaminophen (TYLENOL) oral liquid 160 mg/5 mL  650 mg Per Tube Q6H  . amLODipine  10 mg Per Tube Daily  . baclofen  30 mg Per Tube TID  . [START ON 02/08/2020] botulinum toxin Type A  400 Units Intramuscular Once  . chlorhexidine gluconate (MEDLINE KIT)  15 mL Mouth Rinse BID  . Darunavir-Cobicisctat-Emtricitabine-Tenofovir Alafenamide  1 tablet Oral Q breakfast  . famotidine  10.4 mg Per Tube BID  . feeding supplement (OSMOLITE 1.5 CAL)  237 mL Per Tube Q24H  . feeding supplement (OSMOLITE 1.5 CAL)  474 mL Per Tube TID  . feeding supplement (PROSource TF)  45 mL Per Tube TID  . fiber  1 packet Per Tube BID  .   folic acid  1 mg Per Tube Daily  . free water  200 mL Per Tube Q4H  . gabapentin  200 mg Per Tube Q8H  . heparin injection (subcutaneous)  5,000 Units Subcutaneous Q8H  . levETIRAcetam  1,500 mg Per Tube BID  . Muscle Rub   Topical QID  . oxyCODONE  5 mg Per Tube Q8H  . QUEtiapine  12.5 mg Per Tube QHS  . scopolamine  1 patch Transdermal Q72H  . sulfamethoxazole-trimethoprim  1 tablet Per Tube Once per day on Mon Wed Fri  . thiamine  100 mg Per Tube Daily  . tiZANidine  4 mg Per Tube BID  . tiZANidine  6 mg Per Tube BID   Continuous Infusions:   Active Problems:   Cardiac arrest [facility])   Community acquired pneumonia of left upper lobe of lung   Anoxic brain injury ([place])   Alcohol abuse   Acute respiratory failure with hypoxia ([facility])   Vomiting   Pressure injury of skin   Small bowel obstruction ([place])   Palliative care encounter   Bowel  obstruction ([facility])   Chronic respiratory failure ([facility])   Diastolic dysfunction   Tracheostomy dependent ([place])   Dysphagia   Protein-calorie malnutrition ([place])   Seizures ([place])   Bilateral recurrent inguinal hernia   Muscle spasticity   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H[facility])   Tracheobronchitis   Sequela of Corynebacterium infection   Abnormal urinalysis   Urinary tract infection due to Enterococcus   Consultants:  PCCM  Palliative medicine  General surgery  Neurology  Interventional radiology  Procedures:  EEG  Echocardiogram  Tracheostomy  Antibiotics: Anti-infectives (From admission, onward)   Start     Dose/Rate Route Frequency Ordered Stop   02/01/20 2200  amoxicillin-clavulanate (AUGMENTIN) 250-62.5 MG/5ML suspension 500 mg        500 mg Per Tube Every 8 hours 02/01/20 1343 02/06/20 1333   01/31/20 1015  levofloxacin (LEVAQUIN) 25 MG/ML solution 500 mg  Status:  Discontinued        500 mg Per Tube Daily 01/31/20 0919 02/01/20 1343   01/31/20 1000  cefTRIAXone (ROCEPHIN) 1 g in sodium chloride 0.9 % 100 mL IVPB  Status:  Discontinued        1 g 200 mL/hr over 30 Minutes Intravenous Every 24 hours 01/31/20 0905 01/31/20 0919   01/19/20 2200  linezolid (ZYVOX) tablet 600 mg        600 mg Per Tube Every 12 hours 01/19/20 1534 01/25/20 2019   01/19/20 1100  levofloxacin (LEVAQUIN) tablet 500 mg  Status:  Discontinued        500 mg Per Tube Daily 01/19/20 1004 01/19/20 1534   12/19/19 0930  Darunavir-Cobicisctat-Emtricitabine-Tenofovir Alafenamide (SYMTUZA) 800-150-200-10 MG TABS 1 tablet        1 tablet Oral Daily with breakfast 12/19/19 0840     12/13/19 1000  bictegravir-emtricitabine-tenofovir AF (BIKTARVY) 50-200-25 MG per tablet 1 tablet  Status:  Discontinued        1 tablet Oral Daily 12/12/19 1413 12/19/19 0840   11/17/19 1548  ceFAZolin (ANCEF) 2-4 GM/100ML-% IVPB       Note to Pharmacy: Domenick Bookbinder   : cabinet override      11/17/19 1548  11/18/19 0359   11/16/19 1515  ceFAZolin (ANCEF) IVPB 2g/100 mL premix        2 g 200 mL/hr over 30 Minutes Intravenous To Radiology 11/16/19 1511 11/17/19 1625   11/15/19 0900  sulfamethoxazole-trimethoprim (BACTRIM DS)  800-160 MG per tablet 1 tablet        1 tablet Per Tube Once per day on Mon Wed Fri 11/13/19 1906     11/13/19 1615  dolutegravir (TIVICAY) tablet 50 mg  Status:  Discontinued        50 mg Oral Daily 11/13/19 1521 12/12/19 1413   11/13/19 1615  emtricitabine-tenofovir AF (DESCOVY) 200-25 MG per tablet 1 tablet  Status:  Discontinued        1 tablet Per Tube Daily 11/13/19 1521 12/12/19 1413   11/13/19 1530  sulfamethoxazole-trimethoprim (BACTRIM DS) 800-160 MG per tablet 1 tablet  Status:  Discontinued        1 tablet Oral Once per day on Mon Wed Fri 11/13/19 1441 11/13/19 1906   11/13/19 1500  bictegravir-emtricitabine-tenofovir AF (BIKTARVY) 50-200-25 MG per tablet 1 tablet  Status:  Discontinued        1 tablet Oral Daily 11/13/19 1403 11/13/19 1521   11/10/19 1000  erythromycin 250 mg in sodium chloride 0.9 % 100 mL IVPB        250 mg 100 mL/hr over 60 Minutes Intravenous Every 8 hours 11/10/19 0907 11/11/19 2000   11/07/19 1200  ceFEPIme (MAXIPIME) 2 g in sodium chloride 0.9 % 100 mL IVPB        2 g 200 mL/hr over 30 Minutes Intravenous Every 8 hours 11/07/19 1013 11/13/19 2127   11/03/19 1200  cefTRIAXone (ROCEPHIN) 2 g in sodium chloride 0.9 % 100 mL IVPB        2 g 200 mL/hr over 30 Minutes Intravenous Every 24 hours 11/03/19 0922 11/05/19 1427   11/02/19 0800  vancomycin (VANCOREADY) IVPB 750 mg/150 mL  Status:  Discontinued        750 mg 150 mL/hr over 60 Minutes Intravenous Every 12 hours 11/01/19 1935 11/02/19 1105   11/02/19 0600  piperacillin-tazobactam (ZOSYN) IVPB 3.375 g  Status:  Discontinued        3.375 g 12.5 mL/hr over 240 Minutes Intravenous Every 8 hours 11/02/19 0311 11/03/19 0920   11/01/19 2200  ceFEPIme (MAXIPIME) 2 g in sodium chloride 0.9 %  100 mL IVPB  Status:  Discontinued        2 g 200 mL/hr over 30 Minutes Intravenous Every 12 hours 11/01/19 2143 11/02/19 0311   11/01/19 1915  vancomycin (VANCOREADY) IVPB 1500 mg/300 mL        1,500 mg 150 mL/hr over 120 Minutes Intravenous  Once 11/01/19 1909 11/01/19 2147   11/01/19 1845  cefTRIAXone (ROCEPHIN) 1 g in sodium chloride 0.9 % 100 mL IVPB        1 g 200 mL/hr over 30 Minutes Intravenous  Once 11/01/19 1842 11/01/19 2051   11/01/19 1845  azithromycin (ZITHROMAX) 500 mg in sodium chloride 0.9 % 250 mL IVPB        500 mg 250 mL/hr over 60 Minutes Intravenous  Once 11/01/19 1842 11/01/19 2051       Time spent: 20 minutes    Allison Ellis ANP  [facility]  7 am-330 pm/M-F 02/07/2020, 11:09 AM  LOS: 98 days  

## 2020-02-07 NOTE — Progress Notes (Signed)
Nutrition Follow-up  DOCUMENTATION CODES:   Not applicable  INTERVENTION:   Continue bolus TF regimen via PEG: -Provide 2 cartons (474m) Osmolite 1.5 TID @ 0800, 1200, 1700 -Provide 1 carton (2319m Osmolite 1.5 once daily at bedtime @ 2100 -Flush with 6088mree water before and after each tube feeding bolus -Provide 25m32mosource TF TID -Provide Nutrisource fiber BID -Additional free water flushes currently 200ml57m  Tube feeding regimen provides 2605 kcals, 137 grams protein,1267ml 61m water (2947ml w30mflushes)  NUTRITION DIAGNOSIS:   Increased nutrient needs related to acute illness as evidenced by estimated needs. 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 PMH includes HIV and EtOH use.  09/29 - intubated 10/07- vomiting, TF held, laterrestarted with recurrent vomiting 10/12-R/L inguinal hernia, partial SBO  10/13- trach 10/15-PEG 10/16 - tolerated TC for 9 hours 10/18->10/19 - off vent overnight 10/20- TC 11/15 - trach downsized to #4 Shiley flex CFS 12/06 - trach changed (same #4 Shiley flex CFS)  Pt sleeping at time of RD visit. Pt remains NPO. Tolerating bolus feedings via PEG per RN. Continue with current regimen.   Admission weight: 78.9 kg Current weight: 73 kg   UOP: 650ml x 24mrs   Medications: pepcid, nutrisource fiber BID, folic acid, thiamine Labs: CBG 92-178  Diet Order:   Diet Order            Diet NPO time specified  Diet effective midnight                 EDUCATION NEEDS:   Not appropriate for  education at this time  Skin:  Skin Assessment: Skin Integrity Issues: Skin Integrity Issues:: Other (Comment) Stage II: N/A Stage III: N/A Other: puncture abdomen (PEG insertion site)  Last BM:  02/06/20  Height:   Ht Readings from Last 1 Encounters:  11/01/19 5' 6"  (1.676 m)    Weight:   Wt Readings from Last 1 Encounters:  02/07/20 73 kg    BMI:  Body mass index is 25.97 kg/m.  Estimated Nutritional Needs:   Kcal:  2400-2600  Protein:  120-135 grams  Fluid:  >2L/d  Kareen Jefferys ALarkin Ina, LDN RD pager number and weekend/on-call pager number located in [email or web address]

## 2020-02-08 LAB — GLUCOSE, CAPILLARY
Glucose-Capillary: 109 mg/dL — ABNORMAL HIGH (ref 70–99)
Glucose-Capillary: 106 mg/dL — ABNORMAL HIGH (ref 70–99)
Glucose-Capillary: 152 mg/dL — ABNORMAL HIGH (ref 70–99)
Glucose-Capillary: 123 mg/dL — ABNORMAL HIGH (ref 70–99)
Glucose-Capillary: 98 mg/dL (ref 70–99)
Glucose-Capillary: 128 mg/dL — ABNORMAL HIGH (ref 70–99)

## 2020-02-08 NOTE — TOC Progression Note (Addendum)
Transition of Care [facility]) - Progression Note    Patient Details  Name: Ryan Orozco MRN: [id number] Date of Birth: 1965/08/18  Transition of Care [facility]) CM/SW Contact  Janae Bridgeman, RN Phone Number: 02/08/2020, 2:12 PM  Clinical Narrative:    Case management called and left a message with Clydie Braun [facility] [phone] requesting that they view the patient's clinicals for possible admission at their facility.  The patient Medicaid application was submitted on 12/26/2019 and social security application was completed by the [facility].  Case management will continue to search for possible admission facilities for care.  02/08/2020- spoke with Wilfred Lacy, CM at [facility] and they are unable to accept the patient for admission due to the facility not accepting admission at this time.  The facility is only able to accept patient's with a trach but are unable to care for PEG patients.      Expected Discharge Plan: Skilled Nursing Facility Barriers to Discharge: Continued Medical Work up,SNF Pending bed offer  Expected Discharge Plan and Services Expected Discharge Plan: Skilled Nursing Facility   Discharge Planning Services: CM Consult Post Acute Care Choice: Nursing Home Living arrangements for the past 2 months: Single Family Home                                       Social Determinants of Health (SDOH) Interventions    Readmission Risk Interventions Readmission Risk Prevention Plan 02/06/2020  Transportation Screening Complete  PCP or Specialist Appt within 3-5 Days Complete  HRI or Home Care Consult Complete  Social Work Consult for Recovery Care Planning/Counseling Complete  Palliative Care Screening Complete  Medication Review [employer]) Complete  Some recent data might be hidden

## 2020-02-08 NOTE — Progress Notes (Signed)
Botox Injection for spasticity of lower extremity using needle EMG guidance Indication:  Spastic tetraplegia, RLE>LLE   Dilution: 100 Units/ml        Total Units Injected: 400 Indication: Severe spasticity which interferes with ADL,mobility and/or  hygiene and is unresponsive to medication management and other conservative care Informed consent deferred d/t cognitive status  Needle: 56mm 25 g needle   Number of units per muscle  Quadriceps, rectus femoris 100 units 4 access points Gastroc/soleus 0 units Hamstrings, biceps femoris, semimembranosus, semitendinosus  300 units divided into 6 individual access points.  Tibialis Posterior 0 units Tibialis Anterior 0 units EHL 0 units All injections were done after obtaining after negative drawback for blood. The patient tolerated the procedure well.  Botulinum toxin typically requires 7-10 days to take effect. Ideally we would have injected his iliopsoas but this is difficult without EMG guidance. I believe a lot of his tone especially with hip flexion is being driven by tight hamstrings. Rectus femoris is also a major hip flexor.   Will continue to follow along.    Ranelle Oyster, MD, [facility] Physical Medicine & Rehabilitation 02/08/2020

## 2020-02-08 NOTE — Progress Notes (Addendum)
[facility] PROGRESS NOTE  Ryan Orozco PJA:[id number] DOB: [birth year] DOA: 11/01/2019 PCP: Default, Provider, MD    11/16   Status: Remains inpatient appropriate because:Altered mental status, Unsafe d/c plan and Inpatient level of care appropriate due to severity of illness. Patient newly diagnosed with anoxic brain injury  Dispo: The patient is from: Home              Anticipated d/c is to: SNF              Anticipated d/c date is: > 3 days              Patient currently is medically stable to d/c.  Barriers to discharge: No SNF bed offers.  Needs trach capable facility. TOC communicating with financial counseling team who have yet to hear back from DSS regarding patient's assigned caseworker.  TOC spoke with [facility] rehab.  Patient would be an excellent candidate but they request he have active Medicaid and disability before they can accept him.   Code Status: Full Family Communication: 1/06 dtr Ryan Orozco updated DVT prophylaxis: Subcutaneous heparin Vaccination status: Covid vaccination status unknown  Foley catheter:  No  HPI: 55 year old male with HIV, chronic alcohol abuse who was presented to the emergency department after being found unresponsive outside a convinient store.  EMS found him pulseless in PEA, unknown downtime, successfully resuscitated and brought to the emergency department.  UDS positive for THC and benzodiazepines.  CT head unremarkable.  Failed extubation trials due to severe anoxic brain injury and had tracheostomy placed.  PEG placed on 10/15.  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  This has led to significant pain as well.  [name] was consulted and recommendations/input/orders highly appreciated.  She may also benefit from Botox injections to both hips to aid in treatment of spasticity.   Significant Hospital Events   9/29 Admit post PEA arrest. UDS positive  for THC, benzo's. ETOH 177 10/05 EEG ongoing. Versed restarted overnight due to agitation. Nicardipine increased.  10/06 Developed tachypnea with WUA, no follow commands off sedation  10/07 Vomiting, TF held / restarted with recurrent vomiting 10/16 tracheostomy collar for 9 hours 10/18->10/19 off vent all night  10/20> TC 11/15 > downsized to #4 Shiley flex CFS  Subjective: Awake.  Very alert.  Again stating high meaning he will need the head of his bed elevated.  Was able to follow simple commands today by sticking out tongue.  Objective: Vitals:   02/08/20 0940 02/08/20 1112  BP: 115/82   Pulse: 86 85  Resp: 20 16  Temp: 97.6 F (36.4 C)   SpO2: 99% 97%    Intake/Output Summary (Last 24 hours) at 02/08/2020 1128 Last data filed at 02/07/2020 2206 Gross per 24 hour  Intake --  Output 900 ml  Net -900 ml   Filed Weights   02/02/20 0954 02/07/20 0537 02/08/20 0615  Weight: 71.6 kg 73 kg 70.5 kg    Exam: General: Awake, smiling, interactive and in no acute distress Pulmonary: On room air with humidification to trach collar, Pulse oximetry 98%-anterior lung sounds clear to auscultation Cardiac: S1-S2, no edema, pulse regular Abdomen: Soft, nondistended and non-tender, normoactive bowel sounds.  PEG tube - LBM 1/06 Neurological: Awake interactive today.  Continues with hypertonicity worse in the lower extremities involving the hip flexors and hamstrings.  Has limited use of upper extremities with action primarily initiated from the shoulder girth.  Rehab physician has also  given Botox today and lower extremities.  Assessment/Plan: Acute problems: Anoxic brain injury 2/2 PEA arrest/pain syndrome secondary to hypertonicity/spastic tetraplegia:  -Presumed 2/2 combination alcohol and BZD overdose with UDS positive for THC and BZDs -Significant brain injury with underlying spastic tetraplegia long-term SNF placement recommended-continue PT/OT/SLP -Continue baclofen, gabapentin,  scheduled oxycodone and Zanaflex  -Appreciate assistance of Dr. Len Childs tizanidine to a goal dose of 6 mg every 6 hours -Botox injection into the quadriceps and rectus femoris as well as the biceps Morris and hamstrings, semimembranosus and semitendinosus.  Dr. Eda Keys has documented that Botox typically takes 7 to 10 days before effect is seen.  Unable to inject iliopsoas without EMG guidance. -Continue bilateral upper extremity WHO resting splints along with PROM per nursing staff every 4 hours -Continue low-dose Seroquel for brain injury sequela -Continue scheduled Tylenol for pain along with Crea muscle rub -Continue KREG rotational bed   Enterococcus faecalis UTI -Urine culture positive for pansensitive Enterococcus-empiric Levaquin discontinued on 12/30 in favor of Augmentin and will receive a total of 5 days therapy  Chronic respiratory failure with inability to maintain patent airway requiring chronic tracheostomy tube/Recent Corynebacterium tracheobronchitis (resolved) -Continue PMV training per SLP-#4 cuffless trach in place -Trach team signed off as of 12/27 given not a candidate for decannulation until patient can reliably follow commands, cough and clear airway. -continue physiotherapy with Vibra vest along with supervised PMV trials  HIV:  -CD4 count of 124 October 2021 (had been as high as 250 Dec 2020)-as of today up to 259 -Dc'd Tivicay and Descovy infavor of Biktarvy to for eventual dc to SNF; per my discussion with infectious disease physician [name] on 12/23 preferred regimen would be Descovy and Tivicay-she will contact the HIV pharmacist to attempt to obtain discount cards to make this a more affordable option.  Once this has been confirmed will transition back to Descovy and Tivicay -CD4 count on 12/23 up to 259 -ID recommends continuing prophylactic Bactrim an additional 3 months  Dysphagia 2/2 anoxic brain injury -Continue tube feeding per PEG -SLP  attempted to introduce oral substances on 12/27 but patient did not accept and was pushing out with his tongue.  Given his acute UTI he may have been nauseated and this may have been reason for him not wishing to eat.  Goals of care:  -Palliative care was involved during this hospitalization.   -Goals of care were discussed. remains full code.  -Palliative care recommended outpatient follow-up with palliative providers. -May need to revisit end-of-life goals of care since patient making poor progress and currently having significant pain related to hypertonicity from brain injury  Protein calorie malnutrition nutrition Status: Nutrition Problem: Increased nutrient needs Etiology: acute illness Signs/Symptoms: estimated needs Interventions: Tube feeding bolus doses of Osmolite, Juven twice daily, Prosource TF 45 mL 3 times daily Estimated body mass index is 25.08 kg/m as calculated from the following:   Height as of this encounter: 5' 6"  (1.676 m).   Weight as of this encounter: 70.5 kg.   Multiple decubiti not POA Wound / Incision (Open or Dehisced) 11/17/19 Puncture Abdomen Left;Anterior;Upper G-tube insertion site  (Active)  Date First Assessed/Time First Assessed: 11/17/19 1646   Wound Type: Puncture  Location: Abdomen  Location Orientation: Left;Anterior;Upper  Wound Description (Comments): G-tube insertion site   Present on Admission: No    Assessments 11/17/2019  4:47 PM 02/07/2020 10:06 PM  Dressing Type Gauze (Comment);Tape dressing Gauze (Comment)  Dressing Changed New --  Dressing Status Clean;Dry;Intact Clean;Dry;Intact  Dressing Change Frequency PRN PRN  Site / Wound Assessment Clean;Dry;Pink Clean;Dry  Peri-wound Assessment Intact --  Closure None --  Drainage Amount None --  Treatment Cleansed --     No Linked orders to display      Other problems: Hypertension/grade 1 diastolic dysfunction:  -Continue amlodipine  -Echocardiogram this admission with preserved  LVEF with evidence of grade   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   -Continue Keppra; levetiracetam level 35.9 on 11/12 -no further seizure activity since transitioned out of ICU setting therefore will discontinue seizure precaution  Inguinal hernias -Evaluated by general surgery this admission. Documented as chronically incarcerated. Patient has been clinically stable and tolerating tube feedings and having bowel movements so unless patient obstructed would not be considered a candidate for repair. Even if obstructed consulting surgeon was not sure he would be a candidate for hernia repair regardless .   Data Reviewed: Basic Metabolic Panel: No results for input(s): NA, K, CL, CO2, GLUCOSE, BUN, CREATININE, CALCIUM, MG, PHOS in the last 168 hours. Liver Function Tests: No results for input(s): AST, ALT, ALKPHOS, BILITOT, PROT, ALBUMIN in the last 168 hours. No results for input(s): LIPASE, AMYLASE in the last 168 hours. No results for input(s): AMMONIA in the last 168 hours. CBC: No results for input(s): WBC, NEUTROABS, HGB, HCT, MCV, PLT in the last 168 hours. Cardiac Enzymes: No results for input(s): CKTOTAL, CKMB, CKMBINDEX, TROPONINI in the last 168 hours. BNP (last 3 results) No results for input(s): BNP in the last 8760 hours.  ProBNP (last 3 results) No results for input(s): PROBNP in the last 8760 hours.  CBG: Recent Labs  Lab 02/07/20 1604 02/07/20 2003 02/07/20 2304 02/08/20 0326 02/08/20 0724  GLUCAP 113* 119* 103* 109* 106*    Recent Results (from the past 240 hour(s))  Urine Culture     Status: Abnormal   Collection Time: 01/30/20  5:50 PM   Specimen: Urine, Clean Catch  Result Value Ref Range Status   Specimen Description URINE, CLEAN CATCH  Final   Special Requests   Final    Normal Performed at [facility], [place] [address] [place], [place] [address]    Culture 80,000 COLONIES/mL ENTEROCOCCUS FAECALIS (A)  Final   Report  Status 02/02/2020 FINAL  Final   Organism ID, Bacteria ENTEROCOCCUS FAECALIS (A)  Final      Susceptibility   Enterococcus faecalis - MIC*    AMPICILLIN <=2 SENSITIVE Sensitive     NITROFURANTOIN <=16 SENSITIVE Sensitive     VANCOMYCIN 1 SENSITIVE Sensitive     * 80,000 COLONIES/mL ENTEROCOCCUS FAECALIS     Studies: No results found.  Scheduled Meds: . acetaminophen (TYLENOL) oral liquid 160 mg/5 mL  650 mg Per Tube Q6H  . amLODipine  10 mg Per Tube Daily  . baclofen  30 mg Per Tube TID  . chlorhexidine gluconate (MEDLINE KIT)  15 mL Mouth Rinse BID  . Darunavir-Cobicisctat-Emtricitabine-Tenofovir Alafenamide  1 tablet Oral Q breakfast  . famotidine  10.4 mg Per Tube BID  . feeding supplement (OSMOLITE 1.5 CAL)  237 mL Per Tube Q24H  . feeding supplement (OSMOLITE 1.5 CAL)  474 mL Per Tube TID  . feeding supplement (PROSource TF)  45 mL Per Tube TID  . fiber  1 packet Per Tube BID  . folic acid  1 mg Per Tube Daily  . free water  200 mL Per Tube Q4H  . gabapentin  200 mg Per Tube Q8H  .  heparin injection (subcutaneous)  5,000 Units Subcutaneous Q8H  . levETIRAcetam  1,500 mg Per Tube BID  . Muscle Rub   Topical QID  . oxyCODONE  5 mg Per Tube Q8H  . QUEtiapine  12.5 mg Per Tube QHS  . scopolamine  1 patch Transdermal Q72H  . sulfamethoxazole-trimethoprim  1 tablet Per Tube Once per day on Mon Wed Fri  . thiamine  100 mg Per Tube Daily  . tiZANidine  4 mg Per Tube BID  . tiZANidine  6 mg Per Tube BID   Continuous Infusions:   Active Problems:   Cardiac arrest [facility])   Community acquired pneumonia of left upper lobe of lung   Anoxic brain injury ([place])   Alcohol abuse   Acute respiratory failure with hypoxia (HCC)   Vomiting   Pressure injury of skin   Small bowel obstruction ([place])   Palliative care encounter   Bowel obstruction (HCC)   Chronic respiratory failure (HCC)   Diastolic dysfunction   Tracheostomy dependent ([place])   Dysphagia   Protein-calorie  malnutrition ([employer])   Seizures ([place])   Bilateral recurrent inguinal hernia   Muscle spasticity   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HCC)   Tracheobronchitis   Sequela of Corynebacterium infection   Abnormal urinalysis   Urinary tract infection due to Enterococcus   Consultants:  PCCM  Palliative medicine  General surgery  Neurology  Interventional radiology  Procedures:  EEG  Echocardiogram  Tracheostomy  Antibiotics: Anti-infectives (From admission, onward)   Start     Dose/Rate Route Frequency Ordered Stop   02/01/20 2200  amoxicillin-clavulanate (AUGMENTIN) 250-62.5 MG/5ML suspension 500 mg        500 mg Per Tube Every 8 hours 02/01/20 1343 02/06/20 1333   01/31/20 1015  levofloxacin (LEVAQUIN) 25 MG/ML solution 500 mg  Status:  Discontinued        500 mg Per Tube Daily 01/31/20 0919 02/01/20 1343   01/31/20 1000  cefTRIAXone (ROCEPHIN) 1 g in sodium chloride 0.9 % 100 mL IVPB  Status:  Discontinued        1 g 200 mL/hr over 30 Minutes Intravenous Every 24 hours 01/31/20 0905 01/31/20 0919   01/19/20 2200  linezolid (ZYVOX) tablet 600 mg        600 mg Per Tube Every 12 hours 01/19/20 1534 01/25/20 2019   01/19/20 1100  levofloxacin (LEVAQUIN) tablet 500 mg  Status:  Discontinued        500 mg Per Tube Daily 01/19/20 1004 01/19/20 1534   12/19/19 0930  Darunavir-Cobicisctat-Emtricitabine-Tenofovir Alafenamide (SYMTUZA) 800-150-200-10 MG TABS 1 tablet        1 tablet Oral Daily with breakfast 12/19/19 0840     12/13/19 1000  bictegravir-emtricitabine-tenofovir AF (BIKTARVY) 50-200-25 MG per tablet 1 tablet  Status:  Discontinued        1 tablet Oral Daily 12/12/19 1413 12/19/19 0840   11/17/19 1548  ceFAZolin (ANCEF) 2-4 GM/100ML-% IVPB       Note to Pharmacy: Domenick Bookbinder   : cabinet override      11/17/19 1548 11/18/19 0359   11/16/19 1515  ceFAZolin (ANCEF) IVPB 2g/100 mL premix        2 g 200 mL/hr over 30 Minutes Intravenous To Radiology 11/16/19  1511 11/17/19 1625   11/15/19 0900  sulfamethoxazole-trimethoprim (BACTRIM DS) 800-160 MG per tablet 1 tablet        1 tablet Per Tube Once per day on Mon Wed Fri 11/13/19 1906  11/13/19 1615  dolutegravir (TIVICAY) tablet 50 mg  Status:  Discontinued        50 mg Oral Daily 11/13/19 1521 12/12/19 1413   11/13/19 1615  emtricitabine-tenofovir AF (DESCOVY) 200-25 MG per tablet 1 tablet  Status:  Discontinued        1 tablet Per Tube Daily 11/13/19 1521 12/12/19 1413   11/13/19 1530  sulfamethoxazole-trimethoprim (BACTRIM DS) 800-160 MG per tablet 1 tablet  Status:  Discontinued        1 tablet Oral Once per day on Mon Wed Fri 11/13/19 1441 11/13/19 1906   11/13/19 1500  bictegravir-emtricitabine-tenofovir AF (BIKTARVY) 50-200-25 MG per tablet 1 tablet  Status:  Discontinued        1 tablet Oral Daily 11/13/19 1403 11/13/19 1521   11/10/19 1000  erythromycin 250 mg in sodium chloride 0.9 % 100 mL IVPB        250 mg 100 mL/hr over 60 Minutes Intravenous Every 8 hours 11/10/19 0907 11/11/19 2000   11/07/19 1200  ceFEPIme (MAXIPIME) 2 g in sodium chloride 0.9 % 100 mL IVPB        2 g 200 mL/hr over 30 Minutes Intravenous Every 8 hours 11/07/19 1013 11/13/19 2127   11/03/19 1200  cefTRIAXone (ROCEPHIN) 2 g in sodium chloride 0.9 % 100 mL IVPB        2 g 200 mL/hr over 30 Minutes Intravenous Every 24 hours 11/03/19 0922 11/05/19 1427   11/02/19 0800  vancomycin (VANCOREADY) IVPB 750 mg/150 mL  Status:  Discontinued        750 mg 150 mL/hr over 60 Minutes Intravenous Every 12 hours 11/01/19 1935 11/02/19 1105   11/02/19 0600  piperacillin-tazobactam (ZOSYN) IVPB 3.375 g  Status:  Discontinued        3.375 g 12.5 mL/hr over 240 Minutes Intravenous Every 8 hours 11/02/19 0311 11/03/19 0920   11/01/19 2200  ceFEPIme (MAXIPIME) 2 g in sodium chloride 0.9 % 100 mL IVPB  Status:  Discontinued        2 g 200 mL/hr over 30 Minutes Intravenous Every 12 hours 11/01/19 2143 11/02/19 0311   11/01/19 1915   vancomycin (VANCOREADY) IVPB 1500 mg/300 mL        1,500 mg 150 mL/hr over 120 Minutes Intravenous  Once 11/01/19 1909 11/01/19 2147   11/01/19 1845  cefTRIAXone (ROCEPHIN) 1 g in sodium chloride 0.9 % 100 mL IVPB        1 g 200 mL/hr over 30 Minutes Intravenous  Once 11/01/19 1842 11/01/19 2051   11/01/19 1845  azithromycin (ZITHROMAX) 500 mg in sodium chloride 0.9 % 250 mL IVPB        500 mg 250 mL/hr over 60 Minutes Intravenous  Once 11/01/19 1842 11/01/19 2051       Time spent: 20 minutes    Erin Hearing ANP  [facility]  7 am-330 pm/M-F 02/08/2020, 11:28 AM  LOS: 99 days

## 2020-02-08 NOTE — Plan of Care (Signed)
  Problem: Clinical Measurements: Goal: Ability to maintain clinical measurements within normal limits will improve Outcome: Progressing Goal: Will remain free from infection Outcome: Progressing Goal: Diagnostic test results will improve Outcome: Progressing   Problem: Skin Integrity: Goal: Risk for impaired skin integrity will decrease Outcome: Progressing   

## 2020-02-08 NOTE — Progress Notes (Signed)
CSW spoke with Prem at [facility] - he is agreeable to review this patient's clinical information. CSW faxed clinicals to [phone].  Edwin Dada, MSW, LCSW-A Transitions of Care  Clinical Social Worker I [phone]

## 2020-02-09 LAB — GLUCOSE, CAPILLARY
Glucose-Capillary: 80 mg/dL (ref 70–99)
Glucose-Capillary: 96 mg/dL (ref 70–99)
Glucose-Capillary: 194 mg/dL — ABNORMAL HIGH (ref 70–99)
Glucose-Capillary: 134 mg/dL — ABNORMAL HIGH (ref 70–99)
Glucose-Capillary: 229 mg/dL — ABNORMAL HIGH (ref 70–99)
Glucose-Capillary: 87 mg/dL (ref 70–99)

## 2020-02-09 NOTE — Progress Notes (Signed)
Physical Therapy Treatment Patient Details Name: Ryan Orozco MRN: [id number] DOB: 1965/04/13 Today's Date: 02/09/2020    History of Present Illness 55 year old male found down outside convenience store 9/27. Pt with PEA and hypoxic brain injury. Trach 10/13, PEG 10/15. PMHx: HIV AIDS and alcohol abuse.  CIR physician consulted for spacticity management and s/p botox injections 02/08/20.    PT Comments    Pt very lethargic today. I had difficulty maintaining cognitive arousal with upper trap squeezes, music, and sternal rubs.  VSS throughout on RA.  He was able to transfer OOB to the recliner chair, and I attempted to engage him in work from chair on trunk control, use of UEs and weight shifting, but he was not alert enough to do so today.  We did get out of the room and do a few laps around the RN station for socialization, music therapy, and change in visual stimuli.  He remained lethargic and I lifted him back to bed with the maxi move total lift.  Per Dr. Rosalyn Charters notes it may take 7-10 days for botox injections to take effect.  Currently, we are fairly similar to pre injections, but we are only at day one--will continue to monitor.  PT will continue to follow acutely for safe mobility progression   Follow Up Recommendations  SNF     Equipment Recommendations  Hospital bed;Wheelchair (measurements PT);Other (comment);Wheelchair cushion (measurements PT) (hoyer lift, air mattress)    Recommendations for Other Services       Precautions / Restrictions Precautions Precautions: Fall Precaution Comments: trach, PEG, anoxic, flexor tone R>L LE/UE Required Braces or Orthoses: Other Brace Other Brace: WHOs bil    Mobility  Bed Mobility Overal bed mobility: Needs Assistance Bed Mobility: Rolling Rolling: Total assist            Transfers Overall transfer level: Needs assistance               General transfer comment: total assist for OOB to chair and back to bed at end  of session.  Ambulation/Gait                 Stairs             Wheelchair Mobility    Modified Rankin (Stroke Patients Only)       Balance Overall balance assessment: Needs assistance Sitting-balance support: Feet supported;No upper extremity supported Sitting balance-Leahy Scale: Zero Sitting balance - Comments: total assist.  Was hopeful to work with pt from chair on sitting upright, trunk control.  Pt not very responsive today.  VSS.  We did get out of the room for music therapy, social interaction with staff and change of scenery/visual stimuli.  Nothing help keep his arousal, so transferred back to bed.                                    Cognition Arousal/Alertness: Lethargic Behavior During Therapy: Flat affect Overall Cognitive Status: Impaired/Different from baseline Area of Impairment: Following commands;Problem solving;Awareness;Safety/judgement;Attention                   Current Attention Level: Focused Memory: Decreased short-term memory;Decreased recall of precautions Following Commands: Follows one step commands inconsistently;Follows one step commands with increased time Safety/Judgement: Decreased awareness of deficits;Decreased awareness of safety Awareness: Intellectual Problem Solving: Slow processing;Decreased initiation;Difficulty sequencing;Requires verbal cues;Requires tactile cues General Comments: Pt quite lethargic today.  Needs frequent stimulation  to arouse and then unable to maintain arousal.  No command following today, no assist with mobility, no response to music.      Exercises      General Comments General comments (skin integrity, edema, etc.): Tone not significantly different, however, per Dr. Rosalyn Charters note it could take 7-10 days to take effect.      Pertinent Vitals/Pain Pain Assessment: Faces Faces Pain Scale: Hurts even more Pain Location: BLE with stretching, high tone Pain Descriptors /  Indicators: Guarding;Grimacing Pain Intervention(s): Limited activity within patient's tolerance;Monitored during session;Repositioned    Home Living                      Prior Function            PT Goals (current goals can now be found in the care plan section) Acute Rehab PT Goals Patient Stated Goal: unable to state Progress towards PT goals: Not progressing toward goals - comment (lethargic today)    Frequency    Min 2X/week      PT Plan Current plan remains appropriate    Co-evaluation              AM-PAC PT "6 Clicks" Mobility   Outcome Measure  Help needed turning from your back to your side while in a flat bed without using bedrails?: Total Help needed moving from lying on your back to sitting on the side of a flat bed without using bedrails?: Total Help needed moving to and from a bed to a chair (including a wheelchair)?: Total Help needed standing up from a chair using your arms (e.g., wheelchair or bedside chair)?: Total Help needed to walk in hospital room?: Total Help needed climbing 3-5 steps with a railing? : Total 6 Click Score: 6    End of Session Equipment Utilized During Treatment: Oxygen Activity Tolerance: Patient limited by fatigue Patient left: in bed;with call bell/phone within reach;with bed alarm set Nurse Communication: Mobility status PT Visit Diagnosis: Other abnormalities of gait and mobility (R26.89);Muscle weakness (generalized) (M62.81)     Time: 9629-5284 PT Time Calculation (min) (ACUTE ONLY): 48 min  Charges:  $Therapeutic Activity: 38-52 mins                     Corinna Capra, PT, DPT  Acute Rehabilitation [phone] pager [phone]) [phone] office

## 2020-02-09 NOTE — TOC Progression Note (Signed)
Transition of Care [facility]) - Progression Note    Patient Details  Name: Ryan Orozco MRN: [id number] Date of Birth: 21-Mar-1965  Transition of Care [facility]) CM/SW Contact  Janae Bridgeman, RN Phone Number: 02/09/2020, 2:21 PM  Clinical Narrative:    Case management spoke with Lajean Manes, CM at [facility] and the facility is currently not accepting admission at this time due to staffing; however, the facility does accept patient's with trach collar and peg tubes.  I explained to her that we are actively looking for admission facilities at this time and resent clinicals and therapy notes to her in regards to possible admission in the future once the facility is accepting patients again.   Expected Discharge Plan: Skilled Nursing Facility Barriers to Discharge: Continued Medical Work up,SNF Pending bed offer  Expected Discharge Plan and Services Expected Discharge Plan: Skilled Nursing Facility   Discharge Planning Services: CM Consult Post Acute Care Choice: Nursing Home Living arrangements for the past 2 months: Single Family Home                                       Social Determinants of Health (SDOH) Interventions    Readmission Risk Interventions Readmission Risk Prevention Plan 02/06/2020  Transportation Screening Complete  PCP or Specialist Appt within 3-5 Days Complete  HRI or Home Care Consult Complete  Social Work Consult for Recovery Care Planning/Counseling Complete  Palliative Care Screening Complete  Medication Review Oceanographer) Complete  Some recent data might be hidden

## 2020-02-09 NOTE — Progress Notes (Signed)
  Speech Language Pathology Treatment: Cognitive-Linquistic  Patient Details Name: Bubba Vanbenschoten MRN: [id number] DOB: 1965/10/09 Today's Date: 02/09/2020 Time: 8003-4917 SLP Time Calculation (min) (ACUTE ONLY): 8 min  Assessment / Plan / Recommendation Clinical Impression  This therapist has been primary SLP for majority of admission. He is able to respond "hey" and not able to shape other sounds today. He did not respond to basic yes /no questions. From a swallowing standpoint his vocal quality was wet and unable to clear. [place] had made progress but has had a plateau without significant progress. SLP will sign off at this time and reconsult if demonstrates improvement.     HPI HPI: 55 y/o male with hx of HIV, ETOH abuse found down outside a convenience store on 9/27, received out of hospital CPR. Presents with acute hypoxic respiratory failure s/p cardiac arrest, acute encephalopathy due to hypoxic brain injury. Intubated 9/27-10/13. Received a tracheostomy 10/13, treachestomy collar trial for 9 hours on 10/16. CXR on 10/17 improving and shows left lung opacities have essentially resolved      SLP Plan  Discharge SLP treatment due to (comment);Other (Comment) (decreased progress)       Recommendations                   Oral Care Recommendations: Oral care QID Follow up Recommendations: Skilled Nursing facility SLP Visit Diagnosis: Cognitive communication deficit (R41.841);Dysphagia, unspecified (R13.10) Plan: Discharge SLP treatment due to (comment);Other (Comment) (decreased progress)       GO                Royce Macadamia 02/09/2020, 5:11 PM  [name].Ed Nurse, children's [phone] Office [phone]

## 2020-02-09 NOTE — Progress Notes (Signed)
[facility] PROGRESS NOTE  Ryan Orozco UXN:[id number] DOB: 1965-11-06 DOA: 11/01/2019 PCP: Default, Provider, MD    11/16   Status: Remains inpatient appropriate because:Altered mental status, Unsafe d/c plan and Inpatient level of care appropriate due to severity of illness. Patient newly diagnosed with anoxic brain injury  Dispo: The patient is from: Home              Anticipated d/c is to: SNF              Anticipated d/c date is: > 3 days              Patient currently is medically stable to d/c.  Barriers to discharge: No SNF bed offers.  Needs trach capable facility. TOC communicating with financial counseling team who have yet to hear back from DSS regarding patient's assigned caseworker.  TOC spoke with [facility] rehab.  Patient would be an excellent candidate but they request he have active Medicaid and disability before they can accept him.   Code Status: Full Family Communication: 1/06 dtr [name] updated DVT prophylaxis: Subcutaneous heparin Vaccination status: Covid vaccination status unknown  Foley catheter:  No  HPI: 55 year old male with HIV, chronic alcohol abuse who was presented to the emergency department after being found unresponsive outside a convinient store.  EMS found him pulseless in PEA, unknown downtime, successfully resuscitated and brought to the emergency department.  UDS positive for THC and benzodiazepines.  CT head unremarkable.  Failed extubation trials due to severe anoxic brain injury and had tracheostomy placed.  PEG placed on 10/15.  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  This has led to significant pain as well.  [name] was consulted and recommendations/input/orders highly appreciated.  She may also benefit from Botox injections to both hips to aid in treatment of spasticity.   Significant Hospital Events   9/29 Admit post PEA arrest. UDS positive  for THC, benzo's. ETOH 177 10/05 EEG ongoing. Versed restarted overnight due to agitation. Nicardipine increased.  10/06 Developed tachypnea with WUA, no follow commands off sedation  10/07 Vomiting, TF held / restarted with recurrent vomiting 10/16 tracheostomy collar for 9 hours 10/18->10/19 off vent all night  10/20> TC 11/15 > downsized to #4 Shiley flex CFS  Subjective: Awake.  Very alert.  Again stating high meaning he will need the head of his bed elevated.  Was able to follow simple commands today by sticking out tongue.  Objective: Vitals:   02/09/20 1056 02/09/20 1149  BP: 140/87   Pulse: 96 89  Resp: 17 18  Temp:    SpO2: 98% 96%    Intake/Output Summary (Last 24 hours) at 02/09/2020 1216 Last data filed at 02/09/2020 0517 Gross per 24 hour  Intake -  Output 3200 ml  Net -3200 ml   Filed Weights   02/07/20 0537 02/08/20 0615 02/09/20 0518  Weight: 73 kg 70.5 kg 74 kg    Exam: General: Awake, smiling, interactive and in no acute distress Pulmonary: On room air with humidification to trach collar, Pulse oximetry 98%-anterior lung sounds clear to auscultation Cardiac: S1-S2, no edema, pulse regular Abdomen: Soft, nondistended and non-tender, normoactive bowel sounds.  PEG tube - LBM 1/06 Neurological: Awake interactive today.  Continues with hypertonicity worse in the lower extremities involving the hip flexors and hamstrings.  Has limited use of upper extremities with action primarily initiated from the shoulder girth.  Rehab physician has also given Botox today  and lower extremities.  Assessment/Plan: Acute problems: Anoxic brain injury 2/2 PEA arrest/pain syndrome secondary to hypertonicity/spastic tetraplegia:  -Presumed 2/2 combination alcohol and BZD overdose with UDS positive for THC and BZDs -Significant brain injury with underlying spastic tetraplegia long-term SNF placement recommended-continue PT/OT/SLP -Continue baclofen, gabapentin, scheduled oxycodone  and Zanaflex  -Appreciate assistance of Dr. Len Childs tizanidine to a goal dose of 6 mg every 6 hours -Botox injection into the quadriceps and rectus femoris as well as the biceps Morris and hamstrings, semimembranosus and semitendinosus.  Dr. Eda Keys has documented that Botox typically takes 7 to 10 days before effect is seen.  Unable to inject iliopsoas without EMG guidance. -Continue bilateral upper extremity WHO resting splints along with PROM per nursing staff every 4 hours -Continue low-dose Seroquel for brain injury sequela -Continue scheduled Tylenol for pain along with Crea muscle rub -Continue KREG rotational bed   Enterococcus faecalis UTI -Urine culture positive for pansensitive Enterococcus-empiric Levaquin discontinued on 12/30 in favor of Augmentin and will receive a total of 5 days therapy  Chronic respiratory failure with inability to maintain patent airway requiring chronic tracheostomy tube/Recent Corynebacterium tracheobronchitis (resolved) -Continue PMV training per SLP-#4 cuffless trach in place -Trach team signed off as of 12/27 given not a candidate for decannulation until patient can reliably follow commands, cough and clear airway. -continue physiotherapy with Vibra vest along with supervised PMV trials  HIV:  -CD4 count of 124 October 2021 (had been as high as 250 Dec 2020)-as of today up to 259 -Dc'd Tivicay and Descovy infavor of Biktarvy to for eventual dc to SNF; per my discussion with infectious disease physician [name] on 12/23 preferred regimen would be Descovy and Tivicay-she will contact the HIV pharmacist to attempt to obtain discount cards to make this a more affordable option.  Once this has been confirmed will transition back to Descovy and Tivicay -CD4 count on 12/23 up to 259 -ID recommends continuing prophylactic Bactrim an additional 3 months  Dysphagia 2/2 anoxic brain injury -Continue tube feeding per PEG -SLP attempted to introduce  oral substances on 12/27 but patient did not accept and was pushing out with his tongue.  Given his acute UTI he may have been nauseated and this may have been reason for him not wishing to eat.  Goals of care:  -Palliative care was involved during this hospitalization.   -Goals of care were discussed. remains full code.  -Palliative care recommended outpatient follow-up with palliative providers. -May need to revisit end-of-life goals of care since patient making poor progress and currently having significant pain related to hypertonicity from brain injury  Protein calorie malnutrition nutrition Status: Nutrition Problem: Increased nutrient needs Etiology: acute illness Signs/Symptoms: estimated needs Interventions: Tube feeding bolus doses of Osmolite, Juven twice daily, Prosource TF 45 mL 3 times daily Estimated body mass index is 26.33 kg/m as calculated from the following:   Height as of this encounter: _0  (1.676 m).   Weight as of this encounter: 74 kg.   Multiple decubiti not POA Wound / Incision (Open or Dehisced) 11/17/19 Puncture Abdomen Left;Anterior;Upper G-tube insertion site  (Active)  Date First Assessed/Time First Assessed: 11/17/19 1646   Wound Type: Puncture  Location: Abdomen  Location Orientation: Left;Anterior;Upper  Wound Description (Comments): G-tube insertion site   Present on Admission: No    Assessments 11/17/2019  4:47 PM 02/08/2020  9:59 PM  Dressing Type Gauze (Comment);Tape dressing Gauze (Comment)  Dressing Changed New -  Dressing Status Clean;Dry;Intact Clean;Dry;Intact  Dressing Change  Frequency PRN PRN  Site / Wound Assessment Clean;Dry;Pink Clean;Dry  Peri-wound Assessment Intact -  Closure None -  Drainage Amount None -  Treatment Cleansed -     No Linked orders to display      Other problems: Hypertension/grade 1 diastolic dysfunction:  -Continue amlodipine  -Echocardiogram this admission with preserved LVEF with evidence of grade    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   -Continue Keppra; levetiracetam level 35.9 on 11/12 -no further seizure activity since transitioned out of ICU setting therefore will discontinue seizure precaution  Inguinal hernias -Evaluated by general surgery this admission. Documented as chronically incarcerated. Patient has been clinically stable and tolerating tube feedings and having bowel movements so unless patient obstructed would not be considered a candidate for repair. Even if obstructed consulting surgeon was not sure he would be a candidate for hernia repair regardless .   Data Reviewed: Basic Metabolic Panel: No results for input(s): NA, K, CL, CO2, GLUCOSE, BUN, CREATININE, CALCIUM, MG, PHOS in the last 168 hours. Liver Function Tests: No results for input(s): AST, ALT, ALKPHOS, BILITOT, PROT, ALBUMIN in the last 168 hours. No results for input(s): LIPASE, AMYLASE in the last 168 hours. No results for input(s): AMMONIA in the last 168 hours. CBC: No results for input(s): WBC, NEUTROABS, HGB, HCT, MCV, PLT in the last 168 hours. Cardiac Enzymes: No results for input(s): CKTOTAL, CKMB, CKMBINDEX, TROPONINI in the last 168 hours. BNP (last 3 results) No results for input(s): BNP in the last 8760 hours.  ProBNP (last 3 results) No results for input(s): PROBNP in the last 8760 hours.  CBG: Recent Labs  Lab 02/08/20 2118 02/08/20 2315 02/09/20 0323 02/09/20 0741 02/09/20 1212  GLUCAP 98 128* 80 96 194*    Recent Results (from the past 240 hour(s))  Urine Culture     Status: Abnormal   Collection Time: 01/30/20  5:50 PM   Specimen: Urine, Clean Catch  Result Value Ref Range Status   Specimen Description URINE, CLEAN CATCH  Final   Special Requests   Final    Normal Performed at [facility], [place] [address] [place], [place] [address]    Culture 80,000 COLONIES/mL ENTEROCOCCUS FAECALIS (A)  Final   Report Status 02/02/2020 FINAL  Final    Organism ID, Bacteria ENTEROCOCCUS FAECALIS (A)  Final      Susceptibility   Enterococcus faecalis - MIC*    AMPICILLIN <=2 SENSITIVE Sensitive     NITROFURANTOIN <=16 SENSITIVE Sensitive     VANCOMYCIN 1 SENSITIVE Sensitive     * 80,000 COLONIES/mL ENTEROCOCCUS FAECALIS     Studies: No results found.  Scheduled Meds: . acetaminophen (TYLENOL) oral liquid 160 mg/5 mL  650 mg Per Tube Q6H  . amLODipine  10 mg Per Tube Daily  . baclofen  30 mg Per Tube TID  . chlorhexidine gluconate (MEDLINE KIT)  15 mL Mouth Rinse BID  . Darunavir-Cobicisctat-Emtricitabine-Tenofovir Alafenamide  1 tablet Oral Q breakfast  . famotidine  10.4 mg Per Tube BID  . feeding supplement (OSMOLITE 1.5 CAL)  237 mL Per Tube Q24H  . feeding supplement (OSMOLITE 1.5 CAL)  474 mL Per Tube TID  . feeding supplement (PROSource TF)  45 mL Per Tube TID  . fiber  1 packet Per Tube BID  . folic acid  1 mg Per Tube Daily  . free water  200 mL Per Tube Q4H  . gabapentin  200 mg Per Tube Q8H  . heparin  injection (subcutaneous)  5,000 Units Subcutaneous Q8H  . levETIRAcetam  1,500 mg Per Tube BID  . Muscle Rub   Topical QID  . oxyCODONE  5 mg Per Tube Q8H  . QUEtiapine  12.5 mg Per Tube QHS  . scopolamine  1 patch Transdermal Q72H  . sulfamethoxazole-trimethoprim  1 tablet Per Tube Once per day on Mon Wed Fri  . thiamine  100 mg Per Tube Daily  . tiZANidine  4 mg Per Tube BID  . tiZANidine  6 mg Per Tube BID   Continuous Infusions:   Active Problems:   Cardiac arrest [facility])   Community acquired pneumonia of left upper lobe of lung   Anoxic brain injury ([place])   Alcohol abuse   Acute respiratory failure with hypoxia (H[facility])   Vomiting   Pressure injury of skin   Small bowel obstruction ([place])   Palliative care encounter   Bowel obstruction (H[facility])   Chronic respiratory failure (HCC)   Diastolic dysfunction   Tracheostomy dependent ([place])   Dysphagia   Protein-calorie malnutrition ([place])   Seizures ([place])    Bilateral recurrent inguinal hernia   Muscle spasticity   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HCC)   Tracheobronchitis   Sequela of Corynebacterium infection   Abnormal urinalysis   Urinary tract infection due to Enterococcus   Consultants:  PCCM  Palliative medicine  General surgery  Neurology  Interventional radiology  Procedures:  EEG  Echocardiogram  Tracheostomy  Antibiotics: Anti-infectives (From admission, onward)   Start     Dose/Rate Route Frequency Ordered Stop   02/01/20 2200  amoxicillin-clavulanate (AUGMENTIN) 250-62.5 MG/5ML suspension 500 mg        500 mg Per Tube Every 8 hours 02/01/20 1343 02/06/20 1333   01/31/20 1015  levofloxacin (LEVAQUIN) 25 MG/ML solution 500 mg  Status:  Discontinued        500 mg Per Tube Daily 01/31/20 0919 02/01/20 1343   01/31/20 1000  cefTRIAXone (ROCEPHIN) 1 g in sodium chloride 0.9 % 100 mL IVPB  Status:  Discontinued        1 g 200 mL/hr over 30 Minutes Intravenous Every 24 hours 01/31/20 0905 01/31/20 0919   01/19/20 2200  linezolid (ZYVOX) tablet 600 mg        600 mg Per Tube Every 12 hours 01/19/20 1534 01/25/20 2019   01/19/20 1100  levofloxacin (LEVAQUIN) tablet 500 mg  Status:  Discontinued        500 mg Per Tube Daily 01/19/20 1004 01/19/20 1534   12/19/19 0930  Darunavir-Cobicisctat-Emtricitabine-Tenofovir Alafenamide (SYMTUZA) 800-150-200-10 MG TABS 1 tablet        1 tablet Oral Daily with breakfast 12/19/19 0840     12/13/19 1000  bictegravir-emtricitabine-tenofovir AF (BIKTARVY) 50-200-25 MG per tablet 1 tablet  Status:  Discontinued        1 tablet Oral Daily 12/12/19 1413 12/19/19 0840   11/17/19 1548  ceFAZolin (ANCEF) 2-4 GM/100ML-% IVPB       Note to Pharmacy: Domenick Bookbinder   : cabinet override      11/17/19 1548 11/18/19 0359   11/16/19 1515  ceFAZolin (ANCEF) IVPB 2g/100 mL premix        2 g 200 mL/hr over 30 Minutes Intravenous To Radiology 11/16/19 1511 11/17/19 1625   11/15/19 0900   sulfamethoxazole-trimethoprim (BACTRIM DS) 800-160 MG per tablet 1 tablet        1 tablet Per Tube Once per day on Mon Wed Fri 11/13/19 1906  11/13/19 1615  dolutegravir (TIVICAY) tablet 50 mg  Status:  Discontinued        50 mg Oral Daily 11/13/19 1521 12/12/19 1413   11/13/19 1615  emtricitabine-tenofovir AF (DESCOVY) 200-25 MG per tablet 1 tablet  Status:  Discontinued        1 tablet Per Tube Daily 11/13/19 1521 12/12/19 1413   11/13/19 1530  sulfamethoxazole-trimethoprim (BACTRIM DS) 800-160 MG per tablet 1 tablet  Status:  Discontinued        1 tablet Oral Once per day on Mon Wed Fri 11/13/19 1441 11/13/19 1906   11/13/19 1500  bictegravir-emtricitabine-tenofovir AF (BIKTARVY) 50-200-25 MG per tablet 1 tablet  Status:  Discontinued        1 tablet Oral Daily 11/13/19 1403 11/13/19 1521   11/10/19 1000  erythromycin 250 mg in sodium chloride 0.9 % 100 mL IVPB        250 mg 100 mL/hr over 60 Minutes Intravenous Every 8 hours 11/10/19 0907 11/11/19 2000   11/07/19 1200  ceFEPIme (MAXIPIME) 2 g in sodium chloride 0.9 % 100 mL IVPB        2 g 200 mL/hr over 30 Minutes Intravenous Every 8 hours 11/07/19 1013 11/13/19 2127   11/03/19 1200  cefTRIAXone (ROCEPHIN) 2 g in sodium chloride 0.9 % 100 mL IVPB        2 g 200 mL/hr over 30 Minutes Intravenous Every 24 hours 11/03/19 0922 11/05/19 1427   11/02/19 0800  vancomycin (VANCOREADY) IVPB 750 mg/150 mL  Status:  Discontinued        750 mg 150 mL/hr over 60 Minutes Intravenous Every 12 hours 11/01/19 1935 11/02/19 1105   11/02/19 0600  piperacillin-tazobactam (ZOSYN) IVPB 3.375 g  Status:  Discontinued        3.375 g 12.5 mL/hr over 240 Minutes Intravenous Every 8 hours 11/02/19 0311 11/03/19 0920   11/01/19 2200  ceFEPIme (MAXIPIME) 2 g in sodium chloride 0.9 % 100 mL IVPB  Status:  Discontinued        2 g 200 mL/hr over 30 Minutes Intravenous Every 12 hours 11/01/19 2143 11/02/19 0311   11/01/19 1915  vancomycin (VANCOREADY) IVPB 1500  mg/300 mL        1,500 mg 150 mL/hr over 120 Minutes Intravenous  Once 11/01/19 1909 11/01/19 2147   11/01/19 1845  cefTRIAXone (ROCEPHIN) 1 g in sodium chloride 0.9 % 100 mL IVPB        1 g 200 mL/hr over 30 Minutes Intravenous  Once 11/01/19 1842 11/01/19 2051   11/01/19 1845  azithromycin (ZITHROMAX) 500 mg in sodium chloride 0.9 % 250 mL IVPB        500 mg 250 mL/hr over 60 Minutes Intravenous  Once 11/01/19 1842 11/01/19 2051       Time spent: 20 minutes    Erin Hearing ANP  [facility]  7 am-330 pm/M-F 02/09/2020, 12:16 PM  LOS: 100 days

## 2020-02-10 LAB — CBC
HCT: 45.6 % (ref 39.0–52.0)
Hemoglobin: 14.3 g/dL (ref 13.0–17.0)
MCH: 30.2 pg (ref 26.0–34.0)
MCHC: 31.4 g/dL (ref 30.0–36.0)
MCV: 96.4 fL (ref 80.0–100.0)
Platelets: 179 10*3/uL (ref 150–400)
RBC: 4.73 MIL/uL (ref 4.22–5.81)
RDW: 12.9 % (ref 11.5–15.5)
WBC: 4 10*3/uL (ref 4.0–10.5)
nRBC: 0 % (ref 0.0–0.2)

## 2020-02-10 LAB — GLUCOSE, CAPILLARY
Glucose-Capillary: 97 mg/dL (ref 70–99)
Glucose-Capillary: 89 mg/dL (ref 70–99)
Glucose-Capillary: 114 mg/dL — ABNORMAL HIGH (ref 70–99)
Glucose-Capillary: 140 mg/dL — ABNORMAL HIGH (ref 70–99)
Glucose-Capillary: 106 mg/dL — ABNORMAL HIGH (ref 70–99)
Glucose-Capillary: 146 mg/dL — ABNORMAL HIGH (ref 70–99)

## 2020-02-10 LAB — BASIC METABOLIC PANEL
Anion gap: 11 (ref 5–15)
BUN: 19 mg/dL (ref 6–20)
CO2: 27 mmol/L (ref 22–32)
Calcium: 10.1 mg/dL (ref 8.9–10.3)
Chloride: 105 mmol/L (ref 98–111)
Creatinine, Ser: 0.76 mg/dL (ref 0.61–1.24)
GFR, Estimated: >60 mL/min (ref >60)
Glucose, Bld: 106 mg/dL — ABNORMAL HIGH (ref 70–99)
Potassium: 3.8 mmol/L (ref 3.5–5.1)
Sodium: 143 mmol/L (ref 135–145)

## 2020-02-10 MED ORDER — CHLORHEXIDINE GLUCONATE 0.12 % MT SOLN
OROMUCOSAL | Status: AC
  Filled 2020-02-10: qty 15

## 2020-02-10 NOTE — Progress Notes (Signed)
PROGRESS NOTE    Ryan Orozco  TLX:[id number] DOB: 14-Mar-1965 DOA: 11/01/2019 PCP: Default, Provider, MD    Brief Narrative:  Ryan Orozco is a 55 year old male with past medical history significant for HIV, chronic alcohol abuse who presented to the emergency department after being found unresponsive at a convenience store.  Patient was found pulseless by EMS in PEA arrest with unknown downtime and he was successfully resuscitated and brought to the emergency department.  UDS was positive for THC and benzodiazepines.  CT head unremarkable.  Patient failed extubation trials due to severe anoxic brain injury and subsequently had tracheostomy placed. PEG placed on 11/17/2019.  During admission, patient with issues related to spastic tetraplegia requiring pharmacological treatment; with assistance from physical medicine and rehab specialist, Dr. Tessa Lerner.   Assessment & Plan:   Active Problems:   Cardiac arrest [facility])   Community acquired pneumonia of left upper lobe of lung   Anoxic brain injury ([place])   Alcohol abuse   Acute respiratory failure with hypoxia (HCC)   Vomiting   Pressure injury of skin   Small bowel obstruction (HCC)   Palliative care encounter   Bowel obstruction (HCC)   Chronic respiratory failure (HCC)   Diastolic dysfunction   Tracheostomy dependent (HCC)   Dysphagia   Protein-calorie malnutrition (HCC)   Seizures (HCC)   Bilateral recurrent inguinal hernia   Muscle spasticity   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HCC)   Tracheobronchitis   Sequela of Corynebacterium infection   Abnormal urinalysis   Urinary tract infection due to Enterococcus   Anoxic brain injury secondary to PEA arrest Patient presenting to the ED after being found down from known prolonged.  With PEA and was successfully resuscitated by EMS.  Etiology likely secondary to polysubstance abuse with combination alcohol, benzodiazepines and THC.  Patient failed extubation  trials and underwent tracheostomy and PEG placement. --Continue supplemental oxygen, on 5 L / 21% FiO2 trach collar; maintain SPO2 greater than 92%  Myoclonic seizures --Keppra 1500 mg per tube BID  Spastic tetraplegia Chronic pain syndrome --Baclofen 30 mg per tube 3 times daily --gabapentin 200 mg per tube every 8 hours --oxycodone 5 mg per tube every 8 hours --tizanidine --Receiving Botox injections by PMR, Dr. Tessa Lerner  Enterococcus faecalis UTI urine culture pain Positive for pansensitive Enterococcus.  Initially started on Levaquin which was deescalated to Augmentin and completed 5-day course.  Essential hypertension --Amlodipine 10 mg per tube daily  HIV --CD4 count 259 on 12/23 --Currently on Biktarvy. Per ID, preferred regimen would be Descovy and Tivicay; likely will need assistance with medication --Continue Bactrim for PCP ppx  Dysphagia secondary to anoxic brain injury --Continue tube feeds per PEG --SLP to follow intermittently  Severe protein calorie malnutrition Nutrition Status: Nutrition Problem: Increased nutrient needs Etiology: acute illness Signs/Symptoms: estimated needs Interventions: Tube feeding --Continue tube feeds, nutrition following  Multiple decubitus ulcers, not POA Wound / Incision (Open or Dehisced) 11/17/19 Puncture Abdomen Left;Anterior;Upper G-tube insertion site  (Active)  Date First Assessed/Time First Assessed: 11/17/19 1646   Wound Type: Puncture  Location: Abdomen  Location Orientation: Left;Anterior;Upper  Wound Description (Comments): G-tube insertion site   Present on Admission: No    Assessments 11/17/2019  4:47 PM 02/08/2020  9:59 PM  Dressing Type Gauze (Comment);Tape dressing Gauze (Comment)  Dressing Changed New --  Dressing Status Clean;Dry;Intact Clean;Dry;Intact  Dressing Change Frequency PRN PRN  Site / Wound Assessment Clean;Dry;Pink Clean;Dry  Peri-wound Assessment Intact --  Closure None --  Drainage Amount None  --  Treatment Cleansed --         DVT prophylaxis: Heparin Code Status: Full code Family Communication: No family present at bedside this morning  Disposition Plan:  Status is: Inpatient  Remains inpatient appropriate because:Unsafe d/c plan   Dispo: The patient is from: Home              Anticipated d/c is to: SNF              Anticipated d/c date is: > 3 days              Patient currently is medically stable to d/c.   Consultants:   PCCM  Palliative medicine  General surgery  Neurology  Interventional radiology  Procedures:   EEG  Echocardiogram  Tracheostomy    Subjective: Patient seen and examined at bedside, awake alert.  Unable to obtain any further ROS given his current mental status.  No acute events overnight per nursing staff.  Objective: Vitals:   02/10/20 0500 02/10/20 0545 02/10/20 0816 02/10/20 0922  BP:  95/65  118/85  Pulse:  81 82 86  Resp:  16 18   Temp:  97.8 F (36.6 C)  98.3 F (36.8 C)  TempSrc:    Axillary  SpO2:  100% 94% 96%  Weight: 74.5 kg     Height:        Intake/Output Summary (Last 24 hours) at 02/10/2020 1111 Last data filed at 02/10/2020 0500 Gross per 24 hour  Intake --  Output 2850 ml  Net -2850 ml   Filed Weights   02/08/20 0615 02/09/20 0518 02/10/20 0500  Weight: 70.5 kg 74 kg 74.5 kg    Examination:  General exam: Appears calm and comfortable, awake Respiratory system: Clear to auscultation. Respiratory effort normal.  Tracheostomy site noted Cardiovascular system: S1 & S2 heard, RRR. No JVD, murmurs, rubs, gallops or clicks. No pedal edema. Gastrointestinal system: Abdomen is nondistended, soft and nontender. No organomegaly or masses felt. Normal bowel sounds heard.  PEG tube noted Central nervous system: Alert. No focal neurological deficits. Extremities: Moving extremities independently Psychiatry: Unable to assess given current mental status    Data Reviewed: I have personally reviewed  following labs and imaging studies  CBC: Recent Labs  Lab 02/10/20 0512  WBC 4.0  HGB 14.3  HCT 45.6  MCV 96.4  PLT 973   Basic Metabolic Panel: Recent Labs  Lab 02/10/20 0512  NA 143  K 3.8  CL 105  CO2 27  GLUCOSE 106*  BUN 19  CREATININE 0.76  CALCIUM 10.1   GFR: Estimated Creatinine Clearance: 94.1 mL/min (by C-G formula based on SCr of 0.76 mg/dL). Liver Function Tests: No results for input(s): AST, ALT, ALKPHOS, BILITOT, PROT, ALBUMIN in the last 168 hours. No results for input(s): LIPASE, AMYLASE in the last 168 hours. No results for input(s): AMMONIA in the last 168 hours. Coagulation Profile: No results for input(s): INR, PROTIME in the last 168 hours. Cardiac Enzymes: No results for input(s): CKTOTAL, CKMB, CKMBINDEX, TROPONINI in the last 168 hours. BNP (last 3 results) No results for input(s): PROBNP in the last 8760 hours. HbA1C: No results for input(s): HGBA1C in the last 72 hours. CBG: Recent Labs  Lab 02/09/20 1550 02/09/20 1940 02/09/20 2328 02/10/20 0327 02/10/20 0746  GLUCAP 134* 229* 87 97 89   Lipid Profile: No results for input(s): CHOL, HDL, LDLCALC, TRIG, CHOLHDL, LDLDIRECT in the last 72 hours. Thyroid Function Tests: No results for input(s): TSH, T4TOTAL, FREET4,  T3FREE, THYROIDAB in the last 72 hours. Anemia Panel: No results for input(s): VITAMINB12, FOLATE, FERRITIN, TIBC, IRON, RETICCTPCT in the last 72 hours. Sepsis Labs: No results for input(s): PROCALCITON, LATICACIDVEN in the last 168 hours.  No results found for this or any previous visit (from the past 240 hour(s)).       Radiology Studies: No results found.      Scheduled Meds: . acetaminophen (TYLENOL) oral liquid 160 mg/5 mL  650 mg Per Tube Q6H  . amLODipine  10 mg Per Tube Daily  . baclofen  30 mg Per Tube TID  . chlorhexidine gluconate (MEDLINE KIT)  15 mL Mouth Rinse BID  . Darunavir-Cobicisctat-Emtricitabine-Tenofovir Alafenamide  1 tablet Oral Q  breakfast  . famotidine  10.4 mg Per Tube BID  . feeding supplement (OSMOLITE 1.5 CAL)  237 mL Per Tube Q24H  . feeding supplement (OSMOLITE 1.5 CAL)  474 mL Per Tube TID  . feeding supplement (PROSource TF)  45 mL Per Tube TID  . fiber  1 packet Per Tube BID  . folic acid  1 mg Per Tube Daily  . free water  200 mL Per Tube Q4H  . gabapentin  200 mg Per Tube Q8H  . heparin injection (subcutaneous)  5,000 Units Subcutaneous Q8H  . levETIRAcetam  1,500 mg Per Tube BID  . Muscle Rub   Topical QID  . oxyCODONE  5 mg Per Tube Q8H  . QUEtiapine  12.5 mg Per Tube QHS  . scopolamine  1 patch Transdermal Q72H  . sulfamethoxazole-trimethoprim  1 tablet Per Tube Once per day on Mon Wed Fri  . thiamine  100 mg Per Tube Daily  . tiZANidine  4 mg Per Tube BID  . tiZANidine  6 mg Per Tube BID   Continuous Infusions:   LOS: 101 days    Time spent: 34 minutes spent on chart review, discussion with nursing staff, consultants, updating family and interview/physical exam; more than 50% of that time was spent in counseling and/or coordination of care.    Chiquitta Matty J [place] DO [facility] Available via Epic secure chat 7am-7pm After these hours, please refer to coverage provider listed on [email or web address] 02/10/2020, 11:11 AM

## 2020-02-11 DIAGNOSIS — I5189 Other ill-defined heart diseases: Secondary | ICD-10-CM

## 2020-02-11 LAB — GLUCOSE, CAPILLARY
Glucose-Capillary: 108 mg/dL — ABNORMAL HIGH (ref 70–99)
Glucose-Capillary: 97 mg/dL (ref 70–99)
Glucose-Capillary: 85 mg/dL (ref 70–99)
Glucose-Capillary: 160 mg/dL — ABNORMAL HIGH (ref 70–99)

## 2020-02-11 NOTE — Progress Notes (Signed)
PROGRESS NOTE    Ryan Orozco  HCW:[id number] DOB: Apr 23, 1965 DOA: 11/01/2019 PCP: Default, Provider, MD    Brief Narrative:  Ryan Orozco is a 55 year old male with past medical history significant for HIV, chronic alcohol abuse who presented to the emergency department after being found unresponsive at a convenience store.  Patient was found pulseless by EMS in PEA arrest with unknown downtime and he was successfully resuscitated and brought to the emergency department.  UDS was positive for THC and benzodiazepines.  CT head unremarkable.  Patient failed extubation trials due to severe anoxic brain injury and subsequently had tracheostomy placed. PEG placed on 11/17/2019.  During admission, patient with issues related to spastic tetraplegia requiring pharmacological treatment; with assistance from physical medicine and rehab specialist, Dr. Tessa Lerner.   Assessment & Plan:   Active Problems:   Cardiac arrest [facility])   Community acquired pneumonia of left upper lobe of lung   Anoxic brain injury ([place])   Alcohol abuse   Acute respiratory failure with hypoxia (HCC)   Vomiting   Pressure injury of skin   Small bowel obstruction (HCC)   Palliative care encounter   Bowel obstruction (HCC)   Chronic respiratory failure (HCC)   Diastolic dysfunction   Tracheostomy dependent (HCC)   Dysphagia   Protein-calorie malnutrition (HCC)   Seizures (HCC)   Bilateral recurrent inguinal hernia   Muscle spasticity   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HCC)   Tracheobronchitis   Sequela of Corynebacterium infection   Abnormal urinalysis   Urinary tract infection due to Enterococcus   Anoxic brain injury secondary to PEA arrest Patient presenting to the ED after being found down from known prolonged.  With PEA and was successfully resuscitated by EMS.  Etiology likely secondary to polysubstance abuse with combination alcohol, benzodiazepines and THC.  Patient failed extubation  trials and underwent tracheostomy and PEG placement. --Continue supplemental oxygen, on 5 L / 21% FiO2 trach collar; maintain SPO2 greater than 92%  Myoclonic seizures --Keppra 1500 mg per tube BID  Spastic tetraplegia Chronic pain syndrome --Baclofen 30 mg per tube 3 times daily --gabapentin 200 mg per tube every 8 hours --oxycodone 5 mg per tube every 8 hours --tizanidine --Receiving Botox injections by PMR, Dr. Tessa Lerner  Enterococcus faecalis UTI urine culture pain Positive for pansensitive Enterococcus.  Initially started on Levaquin which was deescalated to Augmentin and completed 5-day course.  Essential hypertension --Amlodipine 10 mg per tube daily  HIV --CD4 count 259 on 12/23 --Currently on Biktarvy. Per ID, preferred regimen would be Descovy and Tivicay; likely will need assistance with medication --Continue Bactrim for PCP ppx  Dysphagia secondary to anoxic brain injury --Continue tube feeds per PEG --SLP to follow intermittently  Severe protein calorie malnutrition Nutrition Status: Nutrition Problem: Increased nutrient needs Etiology: acute illness Signs/Symptoms: estimated needs Interventions: Tube feeding --Continue tube feeds, nutrition following  Multiple decubitus ulcers, not POA Wound / Incision (Open or Dehisced) 11/17/19 Puncture Abdomen Left;Anterior;Upper G-tube insertion site  (Active)  Date First Assessed/Time First Assessed: 11/17/19 1646   Wound Type: Puncture  Location: Abdomen  Location Orientation: Left;Anterior;Upper  Wound Description (Comments): G-tube insertion site   Present on Admission: No    Assessments 11/17/2019  4:47 PM 02/08/2020  9:59 PM  Dressing Type Gauze (Comment);Tape dressing Gauze (Comment)  Dressing Changed New --  Dressing Status Clean;Dry;Intact Clean;Dry;Intact  Dressing Change Frequency PRN PRN  Site / Wound Assessment Clean;Dry;Pink Clean;Dry  Peri-wound Assessment Intact --  Closure None --  Drainage Amount None  --  Treatment Cleansed --         DVT prophylaxis: Heparin Code Status: Full code Family Communication: No family present at bedside this morning  Disposition Plan:  Status is: Inpatient  Remains inpatient appropriate because:Unsafe d/c plan   Dispo: The patient is from: Home              Anticipated d/c is to: SNF              Anticipated d/c date is: > 3 days              Patient currently is medically stable to d/c.   Consultants:   PCCM  Palliative medicine  General surgery  Neurology  Interventional radiology  Procedures:   EEG  Echocardiogram  Tracheostomy    Subjective: Patient seen and examined at bedside, awake alert.  Unable to obtain any further ROS given his current mental status.  No acute events overnight per nursing staff.  Objective: Vitals:   02/10/20 2356 02/11/20 0313 02/11/20 0500 02/11/20 0821  BP:   118/78   Pulse: 99 (!) 101  (!) 106  Resp: 18 (!) 22 20 20   Temp:   98 F (36.7 C)   TempSrc:   Oral   SpO2: 96%  97% 93%  Weight:      Height:        Intake/Output Summary (Last 24 hours) at 02/11/2020 1055 Last data filed at 02/11/2020 0609 Gross per 24 hour  Intake --  Output 2820 ml  Net -2820 ml   Filed Weights   02/08/20 0615 02/09/20 0518 02/10/20 0500  Weight: 70.5 kg 74 kg 74.5 kg    Examination:  General exam: Appears calm and comfortable, awake Respiratory system: Clear to auscultation. Respiratory effort normal.  Tracheostomy site noted Cardiovascular system: S1 & S2 heard, RRR. No JVD, murmurs, rubs, gallops or clicks. No pedal edema. Gastrointestinal system: Abdomen is nondistended, soft and nontender. No organomegaly or masses felt. Normal bowel sounds heard.  PEG tube noted Central nervous system: Alert. No focal neurological deficits. Extremities: Moving extremities independently Psychiatry: Unable to assess given current mental status    Data Reviewed: I have personally reviewed following labs and  imaging studies  CBC: Recent Labs  Lab 02/10/20 0512  WBC 4.0  HGB 14.3  HCT 45.6  MCV 96.4  PLT 540   Basic Metabolic Panel: Recent Labs  Lab 02/10/20 0512  NA 143  K 3.8  CL 105  CO2 27  GLUCOSE 106*  BUN 19  CREATININE 0.76  CALCIUM 10.1   GFR: Estimated Creatinine Clearance: 94.1 mL/min (by C-G formula based on SCr of 0.76 mg/dL). Liver Function Tests: No results for input(s): AST, ALT, ALKPHOS, BILITOT, PROT, ALBUMIN in the last 168 hours. No results for input(s): LIPASE, AMYLASE in the last 168 hours. No results for input(s): AMMONIA in the last 168 hours. Coagulation Profile: No results for input(s): INR, PROTIME in the last 168 hours. Cardiac Enzymes: No results for input(s): CKTOTAL, CKMB, CKMBINDEX, TROPONINI in the last 168 hours. BNP (last 3 results) No results for input(s): PROBNP in the last 8760 hours. HbA1C: No results for input(s): HGBA1C in the last 72 hours. CBG: Recent Labs  Lab 02/10/20 1554 02/10/20 2008 02/10/20 2319 02/11/20 0342 02/11/20 0736  GLUCAP 140* 106* 146* 108* 97   Lipid Profile: No results for input(s): CHOL, HDL, LDLCALC, TRIG, CHOLHDL, LDLDIRECT in the last 72 hours. Thyroid Function Tests: No results for input(s): TSH, T4TOTAL, FREET4, T3FREE,  THYROIDAB in the last 72 hours. Anemia Panel: No results for input(s): VITAMINB12, FOLATE, FERRITIN, TIBC, IRON, RETICCTPCT in the last 72 hours. Sepsis Labs: No results for input(s): PROCALCITON, LATICACIDVEN in the last 168 hours.  No results found for this or any previous visit (from the past 240 hour(s)).       Radiology Studies: No results found.      Scheduled Meds: . acetaminophen (TYLENOL) oral liquid 160 mg/5 mL  650 mg Per Tube Q6H  . amLODipine  10 mg Per Tube Daily  . baclofen  30 mg Per Tube TID  . chlorhexidine gluconate (MEDLINE KIT)  15 mL Mouth Rinse BID  . Darunavir-Cobicisctat-Emtricitabine-Tenofovir Alafenamide  1 tablet Oral Q breakfast  .  famotidine  10.4 mg Per Tube BID  . feeding supplement (OSMOLITE 1.5 CAL)  237 mL Per Tube Q24H  . feeding supplement (OSMOLITE 1.5 CAL)  474 mL Per Tube TID  . feeding supplement (PROSource TF)  45 mL Per Tube TID  . fiber  1 packet Per Tube BID  . folic acid  1 mg Per Tube Daily  . free water  200 mL Per Tube Q4H  . gabapentin  200 mg Per Tube Q8H  . heparin injection (subcutaneous)  5,000 Units Subcutaneous Q8H  . levETIRAcetam  1,500 mg Per Tube BID  . Muscle Rub   Topical QID  . oxyCODONE  5 mg Per Tube Q8H  . QUEtiapine  12.5 mg Per Tube QHS  . scopolamine  1 patch Transdermal Q72H  . sulfamethoxazole-trimethoprim  1 tablet Per Tube Once per day on Mon Wed Fri  . thiamine  100 mg Per Tube Daily  . tiZANidine  4 mg Per Tube BID  . tiZANidine  6 mg Per Tube BID   Continuous Infusions:   LOS: 102 days    Time spent: 34 minutes spent on chart review, discussion with nursing staff, consultants, updating family and interview/physical exam; more than 50% of that time was spent in counseling and/or coordination of care.    Donaldson Richter J [place] DO [facility] Available via Epic secure chat 7am-7pm After these hours, please refer to coverage provider listed on [email or web address] 02/11/2020, 10:55 AM

## 2020-02-11 NOTE — Plan of Care (Signed)

## 2020-02-12 LAB — GLUCOSE, CAPILLARY
Glucose-Capillary: 101 mg/dL — ABNORMAL HIGH (ref 70–99)
Glucose-Capillary: 107 mg/dL — ABNORMAL HIGH (ref 70–99)
Glucose-Capillary: 108 mg/dL — ABNORMAL HIGH (ref 70–99)
Glucose-Capillary: 110 mg/dL — ABNORMAL HIGH (ref 70–99)
Glucose-Capillary: 114 mg/dL — ABNORMAL HIGH (ref 70–99)
Glucose-Capillary: 120 mg/dL — ABNORMAL HIGH (ref 70–99)
Glucose-Capillary: 91 mg/dL (ref 70–99)
Glucose-Capillary: 95 mg/dL (ref 70–99)
Glucose-Capillary: 97 mg/dL (ref 70–99)

## 2020-02-12 MED ORDER — NYSTATIN 100000 UNIT/ML MT SUSP
5.0000 mL | Freq: Four times a day (QID) | OROMUCOSAL | Status: AC
  Administered 2020-02-12 – 2020-02-25 (×55): 500000 [IU] via ORAL
  Filled 2020-02-12 (×55): qty 5

## 2020-02-12 MED ORDER — COVID-19 MRNA VACCINE (PFIZER) 30 MCG/0.3ML IM SUSP
0.3000 mL | Freq: Once | INTRAMUSCULAR | Status: AC
  Administered 2020-02-12: 0.3 mL via INTRAMUSCULAR
  Filled 2020-02-12: qty 0.3

## 2020-02-12 NOTE — Progress Notes (Signed)
Trach changed without any apparent complications. #4 shiley uncuffed removed and replaced with the same size and brand. CO2 color change, BBS heard, two RT's present for change.

## 2020-02-12 NOTE — Plan of Care (Signed)
?  Problem: Clinical Measurements: ?Goal: Respiratory complications will improve ?Outcome: Progressing ?  ?Problem: Clinical Measurements: ?Goal: Cardiovascular complication will be avoided ?Outcome: Progressing ?  ?Problem: Nutrition: ?Goal: Adequate nutrition will be maintained ?Outcome: Progressing ?  ?Problem: Pain Managment: ?Goal: General experience of comfort will improve ?Outcome: Progressing ?  ?

## 2020-02-12 NOTE — Progress Notes (Signed)
[facility] PROGRESS NOTE  Ryan Orozco [place] FUX:[id number] DOB: Aug 20, 1965 DOA: 11/01/2019 PCP: Default, Provider, MD    11/16   Status: Remains inpatient appropriate because:Altered mental status, Unsafe d/c plan and Inpatient level of care appropriate due to severity of illness. Patient newly diagnosed with anoxic brain injury  Dispo: The patient is from: Home              Anticipated d/c is to: SNF              Anticipated d/c date is: > 3 days              Patient currently is medically stable to d/c.  Barriers to discharge: No SNF bed offers.  Needs trach capable facility. TOC communicating with financial counseling team who have yet to hear back from DSS regarding patient's assigned caseworker.  TOC spoke with [facility] rehab.  Patient would be an excellent candidate but they request he have active Medicaid and disability before they can accept him.  Also faxed out information to [facility] as of 1/10   Code Status: Full Family Communication: 1/06 dtr [name] updated DVT prophylaxis: Subcutaneous heparin Vaccination status: Will order first dose Pfizer COVID-vaccine on 1/10; next week we will order pneumococcal and influenza vaccinations  Foley catheter:  No  HPI: 55 year old male with HIV, chronic alcohol abuse who was presented to the emergency department after being found unresponsive outside a convinient store.  EMS found him pulseless in PEA, unknown downtime, successfully resuscitated and brought to the emergency department.  UDS positive for THC and benzodiazepines.  CT head unremarkable.  Failed extubation trials due to severe anoxic brain injury and had tracheostomy placed.  PEG placed on 10/15.  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  This has led to significant pain as well.  [name] was consulted and recommendations/input/orders highly appreciated.  She may also benefit  from Botox injections to both hips to aid in treatment of spasticity.   Significant Hospital Events   9/29 Admit post PEA arrest. UDS positive for THC, benzo's. ETOH 177 10/05 EEG ongoing. Versed restarted overnight due to agitation. Nicardipine increased.  10/06 Developed tachypnea with WUA, no follow commands off sedation  10/07 Vomiting, TF held / restarted with recurrent vomiting 10/16 tracheostomy collar for 9 hours 10/18->10/19 off vent all night  10/20> TC 11/15 > downsized to #4 Shiley flex CFS  Subjective: Awakened.  Repeatedly states high.  Restlessness improves when head of bed elevated significantly.  Objective: Vitals:   02/12/20 0343 02/12/20 0810  BP: 117/76   Pulse: 90 89  Resp: 18 18  Temp: 98.6 F (37 C)   SpO2: 95% 96%    Intake/Output Summary (Last 24 hours) at 02/12/2020 1031 Last data filed at 02/12/2020 0346 Gross per 24 hour  Intake -  Output 3000 ml  Net -3000 ml   Filed Weights   02/08/20 0615 02/09/20 0518 02/10/20 0500  Weight: 70.5 kg 74 kg 74.5 kg    Exam: General: Alert once awakened, calm, no acute distress Pulmonary: Stable on room air with humidification via trach, bilateral lung sounds coarse but clear to auscultation, no significant secretions per trach Cardiac: Pulses regular not tachycardic, heart sounds normal S1-S2, no peripheral edema Abdomen: Soft, nondistended and non-tender, normoactive bowel sounds.  PEG tube - LBM 1/09 Neurological: Awake interactive today.  Continues with hypertonicity worse in the lower extremities involving the hip flexors and hamstrings noting  has received Botox injection on 1/6.  Has limited use of upper extremities with action primarily initiated from the shoulder girth.  Assessment/Plan: Acute problems: Anoxic brain injury 2/2 PEA arrest/pain syndrome secondary to hypertonicity/spastic tetraplegia:  -Presumed 2/2 combination alcohol and BZD overdose with UDS positive for THC and BZDs -Significant  brain injury with underlying spastic tetraplegia long-term SNF placement recommended-continue PT/OT/SLP -Continue baclofen, gabapentin, scheduled oxycodone and Zanaflex  -Appreciate assistance of Dr. Len Childs tizanidine to a goal dose of 6 mg every 6 hours -1/6 Botox injection into the quadriceps and rectus femoris as well as the biceps femoris and hamstrings, semimembranosus and semitendinosus.  Dr. Eda Keys has documented that Botox typically takes 7 to 10 days before effect is seen.  Unable to inject iliopsoas without EMG guidance. -Continue bilateral upper extremity WHO resting splints along with PROM per nursing staff every 4 hours -Continue low-dose Seroquel for brain injury sequela -Continue scheduled Tylenol for pain along with Crea muscle rub -Continue KREG rotational bed   Chronic respiratory failure with inability to maintain patent airway requiring chronic tracheostomy tube/Recent Corynebacterium tracheobronchitis (resolved) -Continue PMV training per SLP-#4 cuffless trach in place -Trach team signed off as of 12/27 given not a candidate for decannulation until patient can reliably follow commands, cough and clear airway. -continue physiotherapy with Vibra vest along with supervised PMV trials  HIV:  -CD4 count of 124 October 2021 (had been as high as 250 Dec 2020)-as of today up to 259 -Dc'd Tivicay and Descovy infavor of Biktarvy to for eventual dc to SNF; per my discussion with infectious disease physician [name] on 12/23 preferred regimen would be Descovy and Tivicay-she will contact the HIV pharmacist to attempt to obtain discount cards to make this a more affordable option.  Once this has been confirmed will transition back to Descovy and Tivicay -CD4 count on 12/23 up to 259 -ID recommends continuing prophylactic Bactrim an additional 3 months  Dysphagia 2/2 anoxic brain injury -Continue tube feeding per PEG -SLP attempted to introduce oral substances on 12/27 but  patient did not accept and was pushing out with his tongue.  Given his acute UTI he may have been nauseated and this may have been reason for him not wishing to eat.  Protein calorie malnutrition nutrition Status: Nutrition Problem: Increased nutrient needs Etiology: acute illness Signs/Symptoms: estimated needs Interventions: Tube feeding bolus doses of Osmolite, Juven twice daily, Prosource TF 45 mL 3 times daily Estimated body mass index is 26.5 kg/m as calculated from the following:   Height as of this encounter: 5' 6"  (1.676 m).   Weight as of this encounter: 74.5 kg.   Multiple decubiti not POA Wound / Incision (Open or Dehisced) 11/17/19 Puncture Abdomen Left;Anterior;Upper G-tube insertion site  (Active)  Date First Assessed/Time First Assessed: 11/17/19 1646   Wound Type: Puncture  Location: Abdomen  Location Orientation: Left;Anterior;Upper  Wound Description (Comments): G-tube insertion site   Present on Admission: No    Assessments 11/17/2019  4:47 PM 02/11/2020  7:52 PM  Dressing Type Gauze (Comment);Tape dressing Gauze (Comment)  Dressing Changed New Changed  Dressing Status Clean;Dry;Intact Clean;Dry;Intact  Dressing Change Frequency PRN PRN  Site / Wound Assessment Clean;Dry;Pink Clean;Dry  Peri-wound Assessment Intact -  Margins - Unattached edges (unapproximated)  Closure None None  Drainage Amount None None  Treatment Cleansed -     No Linked orders to display      Other problems: Hypertension/grade 1 diastolic dysfunction:  -Continue amlodipine  -Echocardiogram this admission with preserved  LVEF with evidence of grade   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   -Continue Keppra; levetiracetam level 35.9 on 11/12 -no further seizure activity since transitioned out of ICU setting therefore will discontinue seizure precaution  Inguinal hernias -Evaluated by general surgery this admission. Documented as chronically incarcerated. Patient has  been clinically stable and tolerating tube feedings and having bowel movements so unless patient obstructed would not be considered a candidate for repair. Even if obstructed consulting surgeon was not sure he would be a candidate for hernia repair regardless . Enterococcus faecalis UTI -Urine culture positive for pansensitive Enterococcus-empiric Levaquin discontinued on 12/30 in favor of Augmentin and completed 5 days of therapy  Goals of care:  -Palliative care was involved during this hospitalization.   -Goals of care were discussed. remains full code.  -Palliative care recommended outpatient follow-up with palliative providers. -May need to revisit end-of-life goals of care since patient making poor progress and currently having significant pain related to hypertonicity from brain injury   Data Reviewed: Basic Metabolic Panel: Recent Labs  Lab 02/10/20 0512  NA 143  K 3.8  CL 105  CO2 27  GLUCOSE 106*  BUN 19  CREATININE 0.76  CALCIUM 10.1   Liver Function Tests: No results for input(s): AST, ALT, ALKPHOS, BILITOT, PROT, ALBUMIN in the last 168 hours. No results for input(s): LIPASE, AMYLASE in the last 168 hours. No results for input(s): AMMONIA in the last 168 hours. CBC: Recent Labs  Lab 02/10/20 0512  WBC 4.0  HGB 14.3  HCT 45.6  MCV 96.4  PLT 179   Cardiac Enzymes: No results for input(s): CKTOTAL, CKMB, CKMBINDEX, TROPONINI in the last 168 hours. BNP (last 3 results) No results for input(s): BNP in the last 8760 hours.  ProBNP (last 3 results) No results for input(s): PROBNP in the last 8760 hours.  CBG: Recent Labs  Lab 02/11/20 1615 02/12/20 0005 02/12/20 0010 02/12/20 0342 02/12/20 0739  GLUCAP 160* 120* 114* 95 110*    No results found for this or any previous visit (from the past 240 hour(s)).   Studies: No results found.  Scheduled Meds: . acetaminophen (TYLENOL) oral liquid 160 mg/5 mL  650 mg Per Tube Q6H  . amLODipine  10 mg Per  Tube Daily  . baclofen  30 mg Per Tube TID  . chlorhexidine gluconate (MEDLINE KIT)  15 mL Mouth Rinse BID  . Darunavir-Cobicisctat-Emtricitabine-Tenofovir Alafenamide  1 tablet Oral Q breakfast  . famotidine  10.4 mg Per Tube BID  . feeding supplement (OSMOLITE 1.5 CAL)  237 mL Per Tube Q24H  . feeding supplement (OSMOLITE 1.5 CAL)  474 mL Per Tube TID  . feeding supplement (PROSource TF)  45 mL Per Tube TID  . fiber  1 packet Per Tube BID  . folic acid  1 mg Per Tube Daily  . free water  200 mL Per Tube Q4H  . gabapentin  200 mg Per Tube Q8H  . heparin injection (subcutaneous)  5,000 Units Subcutaneous Q8H  . levETIRAcetam  1,500 mg Per Tube BID  . Muscle Rub   Topical QID  . nystatin  5 mL Oral QID  . oxyCODONE  5 mg Per Tube Q8H  . QUEtiapine  12.5 mg Per Tube QHS  . scopolamine  1 patch Transdermal Q72H  . sulfamethoxazole-trimethoprim  1 tablet Per Tube Once per day on Mon Wed Fri  . thiamine  100 mg Per Tube Daily  . tiZANidine  4 mg  Per Tube BID  . tiZANidine  6 mg Per Tube BID   Continuous Infusions:   Active Problems:   Cardiac arrest [facility])   Community acquired pneumonia of left upper lobe of lung   Anoxic brain injury ([place])   Alcohol abuse   Acute respiratory failure with hypoxia (HCC)   Vomiting   Pressure injury of skin   Small bowel obstruction ([place])   Palliative care encounter   Bowel obstruction (HCC)   Chronic respiratory failure (HCC)   Diastolic dysfunction   Tracheostomy dependent ([place])   Dysphagia   Protein-calorie malnutrition ([place])   Seizures ([place])   Bilateral recurrent inguinal hernia   Muscle spasticity   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HCC)   Tracheobronchitis   Sequela of Corynebacterium infection   Abnormal urinalysis   Urinary tract infection due to Enterococcus   Consultants:  PCCM  Palliative medicine  General surgery  Neurology  Interventional  radiology  Procedures:  EEG  Echocardiogram  Tracheostomy  Antibiotics: Anti-infectives (From admission, onward)   Start     Dose/Rate Route Frequency Ordered Stop   02/01/20 2200  amoxicillin-clavulanate (AUGMENTIN) 250-62.5 MG/5ML suspension 500 mg        500 mg Per Tube Every 8 hours 02/01/20 1343 02/06/20 1333   01/31/20 1015  levofloxacin (LEVAQUIN) 25 MG/ML solution 500 mg  Status:  Discontinued        500 mg Per Tube Daily 01/31/20 0919 02/01/20 1343   01/31/20 1000  cefTRIAXone (ROCEPHIN) 1 g in sodium chloride 0.9 % 100 mL IVPB  Status:  Discontinued        1 g 200 mL/hr over 30 Minutes Intravenous Every 24 hours 01/31/20 0905 01/31/20 0919   01/19/20 2200  linezolid (ZYVOX) tablet 600 mg        600 mg Per Tube Every 12 hours 01/19/20 1534 01/25/20 2019   01/19/20 1100  levofloxacin (LEVAQUIN) tablet 500 mg  Status:  Discontinued        500 mg Per Tube Daily 01/19/20 1004 01/19/20 1534   12/19/19 0930  Darunavir-Cobicisctat-Emtricitabine-Tenofovir Alafenamide (SYMTUZA) 800-150-200-10 MG TABS 1 tablet        1 tablet Oral Daily with breakfast 12/19/19 0840     12/13/19 1000  bictegravir-emtricitabine-tenofovir AF (BIKTARVY) 50-200-25 MG per tablet 1 tablet  Status:  Discontinued        1 tablet Oral Daily 12/12/19 1413 12/19/19 0840   11/17/19 1548  ceFAZolin (ANCEF) 2-4 GM/100ML-% IVPB       Note to Pharmacy: Domenick Bookbinder   : cabinet override      11/17/19 1548 11/18/19 0359   11/16/19 1515  ceFAZolin (ANCEF) IVPB 2g/100 mL premix        2 g 200 mL/hr over 30 Minutes Intravenous To Radiology 11/16/19 1511 11/17/19 1625   11/15/19 0900  sulfamethoxazole-trimethoprim (BACTRIM DS) 800-160 MG per tablet 1 tablet        1 tablet Per Tube Once per day on Mon Wed Fri 11/13/19 1906     11/13/19 1615  dolutegravir (TIVICAY) tablet 50 mg  Status:  Discontinued        50 mg Oral Daily 11/13/19 1521 12/12/19 1413   11/13/19 1615  emtricitabine-tenofovir AF (DESCOVY) 200-25 MG  per tablet 1 tablet  Status:  Discontinued        1 tablet Per Tube Daily 11/13/19 1521 12/12/19 1413   11/13/19 1530  sulfamethoxazole-trimethoprim (BACTRIM DS) 800-160 MG per tablet 1 tablet  Status:  Discontinued  1 tablet Oral Once per day on Mon Wed Fri 11/13/19 1441 11/13/19 1906   11/13/19 1500  bictegravir-emtricitabine-tenofovir AF (BIKTARVY) 50-200-25 MG per tablet 1 tablet  Status:  Discontinued        1 tablet Oral Daily 11/13/19 1403 11/13/19 1521   11/10/19 1000  erythromycin 250 mg in sodium chloride 0.9 % 100 mL IVPB        250 mg 100 mL/hr over 60 Minutes Intravenous Every 8 hours 11/10/19 0907 11/11/19 2000   11/07/19 1200  ceFEPIme (MAXIPIME) 2 g in sodium chloride 0.9 % 100 mL IVPB        2 g 200 mL/hr over 30 Minutes Intravenous Every 8 hours 11/07/19 1013 11/13/19 2127   11/03/19 1200  cefTRIAXone (ROCEPHIN) 2 g in sodium chloride 0.9 % 100 mL IVPB        2 g 200 mL/hr over 30 Minutes Intravenous Every 24 hours 11/03/19 0922 11/05/19 1427   11/02/19 0800  vancomycin (VANCOREADY) IVPB 750 mg/150 mL  Status:  Discontinued        750 mg 150 mL/hr over 60 Minutes Intravenous Every 12 hours 11/01/19 1935 11/02/19 1105   11/02/19 0600  piperacillin-tazobactam (ZOSYN) IVPB 3.375 g  Status:  Discontinued        3.375 g 12.5 mL/hr over 240 Minutes Intravenous Every 8 hours 11/02/19 0311 11/03/19 0920   11/01/19 2200  ceFEPIme (MAXIPIME) 2 g in sodium chloride 0.9 % 100 mL IVPB  Status:  Discontinued        2 g 200 mL/hr over 30 Minutes Intravenous Every 12 hours 11/01/19 2143 11/02/19 0311   11/01/19 1915  vancomycin (VANCOREADY) IVPB 1500 mg/300 mL        1,500 mg 150 mL/hr over 120 Minutes Intravenous  Once 11/01/19 1909 11/01/19 2147   11/01/19 1845  cefTRIAXone (ROCEPHIN) 1 g in sodium chloride 0.9 % 100 mL IVPB        1 g 200 mL/hr over 30 Minutes Intravenous  Once 11/01/19 1842 11/01/19 2051   11/01/19 1845  azithromycin (ZITHROMAX) 500 mg in sodium chloride 0.9  % 250 mL IVPB        500 mg 250 mL/hr over 60 Minutes Intravenous  Once 11/01/19 1842 11/01/19 2051       Time spent: 20 minutes    Erin Hearing ANP  [facility]  7 am-330 pm/M-F for direct patient care and secure chat Please refer to [place] for contact information 02/12/2020, 10:31 AM  LOS: 103 days

## 2020-02-12 NOTE — TOC Progression Note (Signed)
Transition of Care [facility]) - Progression Note    Patient Details  Name: Ryan Orozco MRN: [id number] Date of Birth: 03-Apr-1965  [facility] CM/SW Contact  Janae Bridgeman, RN Phone Number: 02/12/2020, 11:17 AM  Clinical Narrative:    Case management reached out to [place], admissions liaison at [facility] and rehab and asked about admission potential for this patient and sent clinicals to fax #[phone].  I also spoke with [facility] and asked for review of patient's clinicals for possible charity admission for this patient.  [facility] states that they will speak to the DON and reach out to [facility], CSW supervisor if able to consider patient for admission.  The patient is currently unvaccinated for COVID infection and will receive first vaccine through [employer] today for first of two vaccines at this time.  Case management and CSW will continue to explore admission opportunities for this patient and follow up with clinicals faxed out today.   Expected Discharge Plan: Skilled Nursing Facility Barriers to Discharge: Continued Medical Work up,SNF Pending bed offer  Expected Discharge Plan and Services Expected Discharge Plan: Skilled Nursing Facility   Discharge Planning Services: CM Consult Post Acute Care Choice: Nursing Home Living arrangements for the past 2 months: Single Family Home                                       Social Determinants of Health (SDOH) Interventions    Readmission Risk Interventions Readmission Risk Prevention Plan 02/06/2020  Transportation Screening Complete  PCP or Specialist Appt within 3-5 Days Complete  HRI or Home Care Consult Complete  Social Work Consult for Recovery Care Planning/Counseling Complete  Palliative Care Screening Complete  Medication Review [employer]) Complete  Some recent data might be hidden

## 2020-02-13 LAB — GLUCOSE, CAPILLARY
Glucose-Capillary: 95 mg/dL (ref 70–99)
Glucose-Capillary: 97 mg/dL (ref 70–99)
Glucose-Capillary: 146 mg/dL — ABNORMAL HIGH (ref 70–99)
Glucose-Capillary: 112 mg/dL — ABNORMAL HIGH (ref 70–99)
Glucose-Capillary: 111 mg/dL — ABNORMAL HIGH (ref 70–99)
Glucose-Capillary: 98 mg/dL (ref 70–99)

## 2020-02-13 MED ORDER — TIZANIDINE HCL 4 MG PO TABS
6.0000 mg | ORAL_TABLET | Freq: Four times a day (QID) | ORAL | Status: DC
  Administered 2020-02-13 – 2020-04-08 (×222): 6 mg
  Filled 2020-02-13 (×232): qty 1

## 2020-02-13 NOTE — Progress Notes (Signed)
[facility] PROGRESS NOTE  Ryan Orozco BWL:[id number] DOB: 07/24/1965 DOA: 11/01/2019 PCP: Default, Provider, MD    11/16   Status: Remains inpatient appropriate because:Altered mental status, Unsafe d/c plan and Inpatient level of care appropriate due to severity of illness. Patient newly diagnosed with anoxic brain injury  Dispo: The patient is from: Home              Anticipated d/c is to: SNF              Anticipated d/c date is: > 3 days              Patient currently is medically stable to d/c.  Barriers to discharge: No SNF bed offers.  Needs trach capable facility. TOC communicating with financial counseling team who have yet to hear back from DSS regarding patient's assigned caseworker.  TOC spoke with [facility] rehab.  Patient would be an excellent candidate but they request he have active Medicaid and disability before they can accept him.  Also faxed out information to [facility] as of 1/10   Code Status: Full Family Communication: 1/06 dtr Ryan Orozco updated DVT prophylaxis: Subcutaneous heparin Vaccination status: Will order first dose Pfizer COVID-vaccine on 1/10; next week we will order pneumococcal and influenza vaccinations  Foley catheter:  No  HPI: 55 year old male with HIV, chronic alcohol abuse who was presented to the emergency department after being found unresponsive outside a convinient store.  EMS found him pulseless in PEA, unknown downtime, successfully resuscitated and brought to the emergency department.  UDS positive for THC and benzodiazepines.  CT head unremarkable.  Failed extubation trials due to severe anoxic brain injury and had tracheostomy placed.  PEG placed on 10/15.  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  This has led to significant pain as well.  [name] was consulted and recommendations/input/orders highly appreciated.  She may also benefit  from Botox injections to both hips to aid in treatment of spasticity.   Significant Hospital Events   9/29 Admit post PEA arrest. UDS positive for THC, benzo's. ETOH 177 10/05 EEG ongoing. Versed restarted overnight due to agitation. Nicardipine increased.  10/06 Developed tachypnea with WUA, no follow commands off sedation  10/07 Vomiting, TF held / restarted with recurrent vomiting 10/16 tracheostomy collar for 9 hours 10/18->10/19 off vent all night  10/20> TC 11/15 > downsized to #4 Shiley flex CFS  Subjective: Awake.  Seems to be somewhat uncomfortable so repositioned in bed.  Objective: Vitals:   02/13/20 0508 02/13/20 0706  BP: 128/81   Pulse: 84 91  Resp: 18 19  Temp: 98.1 F (36.7 C)   SpO2: 94% 96%   No intake or output data in the 24 hours ending 02/13/20 1051 Filed Weights   02/09/20 0518 02/10/20 0500 02/13/20 0750  Weight: 74 kg 74.5 kg 70.5 kg    Exam: General: Awake with limited verbal response but no acute distress other than possible mild discomfort Pulmonary: Anterior lung sounds clear to auscultation, trach with minimal drainage.  Humidified room air via trach collar Cardiac: Regular pulse, normal heart sounds S1-S2 with no murmurs or rubs, no peripheral edema Abdomen: PEG site unremarkable, abdomen soft and nontender to palpation with normoactive bowel sounds, LBM 1/09 Neurological: Awake interactive today. Continues with hypertonicity worse in the lower extremities involving the hip flexors and hamstrings noting has received Botox injection on 1/6.  Has limited use of upper extremities with action primarily  initiated from the shoulder girth.  Assessment/Plan: Acute problems: Anoxic brain injury 2/2 PEA arrest/pain syndrome secondary to hypertonicity/spastic tetraplegia:  -Presumed 2/2 combination alcohol and BZD overdose with UDS positive for THC and BZDs -Significant brain injury with underlying spastic tetraplegia long-term SNF placement  recommended-continue PT/OT/SLP -Continue baclofen, gabapentin, scheduled oxycodone and Zanaflex  -Appreciate assistance of Dr. Kathleene Hazel dose has been uptitrated to 6 mg every 6 hours -1/6 Botox injection into the quadriceps and rectus femoris as well as the biceps femoris and hamstrings, semimembranosus and semitendinosus.  Dr. Eda Keys has documented that Botox typically takes 7 to 10 days before effect is seen.  Unable to inject iliopsoas without EMG guidance. -Continue bilateral upper extremity WHO resting splints along with PROM per nursing staff every 4 hours -Continue low-dose Seroquel for brain injury sequela -Continue scheduled Tylenol for pain along with Crea muscle rub -Continue KREG rotational bed   Chronic respiratory failure with inability to maintain patent airway requiring chronic tracheostomy tube/Recent Corynebacterium tracheobronchitis (resolved) -Continue PMV training per SLP-#4 cuffless trach in place-trach changed out on 1/10 RT -Trach team signed off as of 12/27 given not a candidate for decannulation until patient can reliably follow commands, cough and clear airway. -continue physiotherapy with Vibra vest along with supervised PMV trials  HIV:  -CD4 count of 124 October 2021 (had been as high as 250 Dec 2020)-as of today up to 259 -Dc'd Tivicay and Descovy infavor of Biktarvy to for eventual dc to SNF; per my discussion with infectious disease physician [name] on 12/23 preferred regimen would be Descovy and Tivicay-she will contact the HIV pharmacist to attempt to obtain discount cards to make this a more affordable option.  Once this has been confirmed will transition back to Descovy and Tivicay -CD4 count on 12/23 up to 259 -ID recommends continuing prophylactic Bactrim an additional 3 months  Dysphagia 2/2 anoxic brain injury -Continue tube feeding per PEG -SLP attempted to introduce oral substances on 12/27 but patient did not accept and was pushing out  with his tongue.  Given his acute UTI he may have been nauseated and this may have been reason for him not wishing to eat.  Protein calorie malnutrition nutrition Status: Nutrition Problem: Increased nutrient needs Etiology: acute illness Signs/Symptoms: estimated needs Interventions: Tube feeding bolus doses of Osmolite, Juven twice daily, Prosource TF 45 mL 3 times daily Estimated body mass index is 25.08 kg/m as calculated from the following:   Height as of this encounter: 5' 6"  (1.676 m).   Weight as of this encounter: 70.5 kg.   Multiple decubiti not POA Wound / Incision (Open or Dehisced) 11/17/19 Puncture Abdomen Left;Anterior;Upper G-tube insertion site  (Active)  Date First Assessed/Time First Assessed: 11/17/19 1646   Wound Type: Puncture  Location: Abdomen  Location Orientation: Left;Anterior;Upper  Wound Description (Comments): G-tube insertion site   Present on Admission: No    Assessments 11/17/2019  4:47 PM 02/12/2020  9:02 PM  Dressing Type Gauze (Comment);Tape dressing Gauze (Comment)  Dressing Changed New -  Dressing Status Clean;Dry;Intact Clean;Dry;Intact  Dressing Change Frequency PRN -  Site / Wound Assessment Clean;Dry;Pink Clean;Dry;Pink  Peri-wound Assessment Intact -  Closure None -  Drainage Amount None -  Treatment Cleansed -     No Linked orders to display      Other problems: Hypertension/grade 1 diastolic dysfunction:  -Continue amlodipine  -Echocardiogram this admission with preserved LVEF with evidence of grade   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   -Continue  Keppra; levetiracetam level 35.9 on 11/12 -no further seizure activity since transitioned out of ICU setting therefore will discontinue seizure precaution  Inguinal hernias -Evaluated by general surgery this admission. Documented as chronically incarcerated. Patient has been clinically stable and tolerating tube feedings and having bowel movements so unless patient  obstructed would not be considered a candidate for repair. Even if obstructed consulting surgeon was not sure he would be a candidate for hernia repair regardless . Enterococcus faecalis UTI -Urine culture positive for pansensitive Enterococcus-empiric Levaquin discontinued on 12/30 in favor of Augmentin and completed 5 days of therapy  Goals of care:  -Palliative care was involved during this hospitalization.   -Goals of care were discussed. remains full code.  -Palliative care recommended outpatient follow-up with palliative providers. -May need to revisit end-of-life goals of care since patient making poor progress and currently having significant pain related to hypertonicity from brain injury   Data Reviewed: Basic Metabolic Panel: Recent Labs  Lab 02/10/20 0512  NA 143  K 3.8  CL 105  CO2 27  GLUCOSE 106*  BUN 19  CREATININE 0.76  CALCIUM 10.1   Liver Function Tests: No results for input(s): AST, ALT, ALKPHOS, BILITOT, PROT, ALBUMIN in the last 168 hours. No results for input(s): LIPASE, AMYLASE in the last 168 hours. No results for input(s): AMMONIA in the last 168 hours. CBC: Recent Labs  Lab 02/10/20 0512  WBC 4.0  HGB 14.3  HCT 45.6  MCV 96.4  PLT 179   Cardiac Enzymes: No results for input(s): CKTOTAL, CKMB, CKMBINDEX, TROPONINI in the last 168 hours. BNP (last 3 results) No results for input(s): BNP in the last 8760 hours.  ProBNP (last 3 results) No results for input(s): PROBNP in the last 8760 hours.  CBG: Recent Labs  Lab 02/12/20 1620 02/12/20 2000 02/12/20 2308 02/13/20 0310 02/13/20 0715  GLUCAP 97 101* 108* 95 97    No results found for this or any previous visit (from the past 240 hour(s)).   Studies: No results found.  Scheduled Meds: . acetaminophen (TYLENOL) oral liquid 160 mg/5 mL  650 mg Per Tube Q6H  . amLODipine  10 mg Per Tube Daily  . baclofen  30 mg Per Tube TID  . chlorhexidine gluconate (MEDLINE KIT)  15 mL Mouth  Rinse BID  . Darunavir-Cobicisctat-Emtricitabine-Tenofovir Alafenamide  1 tablet Oral Q breakfast  . famotidine  10.4 mg Per Tube BID  . feeding supplement (OSMOLITE 1.5 CAL)  237 mL Per Tube Q24H  . feeding supplement (OSMOLITE 1.5 CAL)  474 mL Per Tube TID  . feeding supplement (PROSource TF)  45 mL Per Tube TID  . fiber  1 packet Per Tube BID  . folic acid  1 mg Per Tube Daily  . free water  200 mL Per Tube Q4H  . gabapentin  200 mg Per Tube Q8H  . heparin injection (subcutaneous)  5,000 Units Subcutaneous Q8H  . levETIRAcetam  1,500 mg Per Tube BID  . Muscle Rub   Topical QID  . nystatin  5 mL Oral QID  . oxyCODONE  5 mg Per Tube Q8H  . QUEtiapine  12.5 mg Per Tube QHS  . scopolamine  1 patch Transdermal Q72H  . sulfamethoxazole-trimethoprim  1 tablet Per Tube Once per day on Mon Wed Fri  . thiamine  100 mg Per Tube Daily  . tiZANidine  4 mg Per Tube BID  . tiZANidine  6 mg Per Tube BID   Continuous Infusions:  Active Problems:   Cardiac arrest [facility])   Community acquired pneumonia of left upper lobe of lung   Anoxic brain injury ([place])   Alcohol abuse   Acute respiratory failure with hypoxia ([facility])   Vomiting   Pressure injury of skin   Small bowel obstruction ([place])   Palliative care encounter   Bowel obstruction ([facility])   Chronic respiratory failure (H[facility])   Diastolic dysfunction   Tracheostomy dependent ([place])   Dysphagia   Protein-calorie malnutrition ([place])   Seizures ([place])   Bilateral recurrent inguinal hernia   Muscle spasticity   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H[facility])   Tracheobronchitis   Sequela of Corynebacterium infection   Abnormal urinalysis   Urinary tract infection due to Enterococcus   Consultants:  PCCM  Palliative medicine  General surgery  Neurology  Interventional radiology  Procedures:  EEG  Echocardiogram  Tracheostomy  Antibiotics: Anti-infectives (From admission, onward)   Start     Dose/Rate Route Frequency  Ordered Stop   02/01/20 2200  amoxicillin-clavulanate (AUGMENTIN) 250-62.5 MG/5ML suspension 500 mg        500 mg Per Tube Every 8 hours 02/01/20 1343 02/06/20 1333   01/31/20 1015  levofloxacin (LEVAQUIN) 25 MG/ML solution 500 mg  Status:  Discontinued        500 mg Per Tube Daily 01/31/20 0919 02/01/20 1343   01/31/20 1000  cefTRIAXone (ROCEPHIN) 1 g in sodium chloride 0.9 % 100 mL IVPB  Status:  Discontinued        1 g 200 mL/hr over 30 Minutes Intravenous Every 24 hours 01/31/20 0905 01/31/20 0919   01/19/20 2200  linezolid (ZYVOX) tablet 600 mg        600 mg Per Tube Every 12 hours 01/19/20 1534 01/25/20 2019   01/19/20 1100  levofloxacin (LEVAQUIN) tablet 500 mg  Status:  Discontinued        500 mg Per Tube Daily 01/19/20 1004 01/19/20 1534   12/19/19 0930  Darunavir-Cobicisctat-Emtricitabine-Tenofovir Alafenamide (SYMTUZA) 800-150-200-10 MG TABS 1 tablet        1 tablet Oral Daily with breakfast 12/19/19 0840     12/13/19 1000  bictegravir-emtricitabine-tenofovir AF (BIKTARVY) 50-200-25 MG per tablet 1 tablet  Status:  Discontinued        1 tablet Oral Daily 12/12/19 1413 12/19/19 0840   11/17/19 1548  ceFAZolin (ANCEF) 2-4 GM/100ML-% IVPB       Note to Pharmacy: Domenick Bookbinder   : cabinet override      11/17/19 1548 11/18/19 0359   11/16/19 1515  ceFAZolin (ANCEF) IVPB 2g/100 mL premix        2 g 200 mL/hr over 30 Minutes Intravenous To Radiology 11/16/19 1511 11/17/19 1625   11/15/19 0900  sulfamethoxazole-trimethoprim (BACTRIM DS) 800-160 MG per tablet 1 tablet        1 tablet Per Tube Once per day on Mon Wed Fri 11/13/19 1906     11/13/19 1615  dolutegravir (TIVICAY) tablet 50 mg  Status:  Discontinued        50 mg Oral Daily 11/13/19 1521 12/12/19 1413   11/13/19 1615  emtricitabine-tenofovir AF (DESCOVY) 200-25 MG per tablet 1 tablet  Status:  Discontinued        1 tablet Per Tube Daily 11/13/19 1521 12/12/19 1413   11/13/19 1530  sulfamethoxazole-trimethoprim (BACTRIM DS)  800-160 MG per tablet 1 tablet  Status:  Discontinued        1 tablet Oral Once per day on Mon Wed Fri 11/13/19 1441  11/13/19 1906   11/13/19 1500  bictegravir-emtricitabine-tenofovir AF (BIKTARVY) 50-200-25 MG per tablet 1 tablet  Status:  Discontinued        1 tablet Oral Daily 11/13/19 1403 11/13/19 1521   11/10/19 1000  erythromycin 250 mg in sodium chloride 0.9 % 100 mL IVPB        250 mg 100 mL/hr over 60 Minutes Intravenous Every 8 hours 11/10/19 0907 11/11/19 2000   11/07/19 1200  ceFEPIme (MAXIPIME) 2 g in sodium chloride 0.9 % 100 mL IVPB        2 g 200 mL/hr over 30 Minutes Intravenous Every 8 hours 11/07/19 1013 11/13/19 2127   11/03/19 1200  cefTRIAXone (ROCEPHIN) 2 g in sodium chloride 0.9 % 100 mL IVPB        2 g 200 mL/hr over 30 Minutes Intravenous Every 24 hours 11/03/19 0922 11/05/19 1427   11/02/19 0800  vancomycin (VANCOREADY) IVPB 750 mg/150 mL  Status:  Discontinued        750 mg 150 mL/hr over 60 Minutes Intravenous Every 12 hours 11/01/19 1935 11/02/19 1105   11/02/19 0600  piperacillin-tazobactam (ZOSYN) IVPB 3.375 g  Status:  Discontinued        3.375 g 12.5 mL/hr over 240 Minutes Intravenous Every 8 hours 11/02/19 0311 11/03/19 0920   11/01/19 2200  ceFEPIme (MAXIPIME) 2 g in sodium chloride 0.9 % 100 mL IVPB  Status:  Discontinued        2 g 200 mL/hr over 30 Minutes Intravenous Every 12 hours 11/01/19 2143 11/02/19 0311   11/01/19 1915  vancomycin (VANCOREADY) IVPB 1500 mg/300 mL        1,500 mg 150 mL/hr over 120 Minutes Intravenous  Once 11/01/19 1909 11/01/19 2147   11/01/19 1845  cefTRIAXone (ROCEPHIN) 1 g in sodium chloride 0.9 % 100 mL IVPB        1 g 200 mL/hr over 30 Minutes Intravenous  Once 11/01/19 1842 11/01/19 2051   11/01/19 1845  azithromycin (ZITHROMAX) 500 mg in sodium chloride 0.9 % 250 mL IVPB        500 mg 250 mL/hr over 60 Minutes Intravenous  Once 11/01/19 1842 11/01/19 2051       Time spent: 20 minutes    Erin Hearing  ANP  [facility]  7 am-330 pm/M-F for direct patient care and secure chat Please refer to [place] for contact information 02/13/2020, 10:51 AM  LOS: 104 days

## 2020-02-13 NOTE — Progress Notes (Addendum)
Nutrition Follow-up  DOCUMENTATION CODES:   Not applicable  INTERVENTION:   Continue bolus TF regimen via PEG: -Provide 2 cartons (47m) Osmolite 1.5 TID @ 0800, 1200, 1700 -Provide 1 carton (2342m Osmolite 1.5 once daily at bedtime @ 2100 -Flush with 6062mree water before and after each tube feeding bolus -Provide 93m14mosource TF TID -Provide Nutrisource fiber BID -Additional free water flushes currently 200ml85m  Tube feeding regimen provides 2605 kcals, 137 grams protein,1267ml 34m water (2947ml w26mflushes)  NUTRITION DIAGNOSIS:   Increased nutrient needs related to acute illness as evidenced by estimated needs. 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 PMH includes HIV and EtOH use.  09/29 - intubated 10/07- vomiting, TF held, laterrestarted with recurrent vomiting 10/12-R/L inguinal hernia, partial SBO  10/13- trach 10/15-PEG 10/16 - tolerated TC for 9 hours 10/18->10/19 - off vent overnight 10/20- TC 11/15 - trach downsized to #4 Shiley flex CFS 12/06 - trach changed (same #4 Shiley flex CFS) 1/11- trach changed (same #4 Shiley flex CFS)  Pt sleeping at time of RD visit. Pt remains NPO. Tolerating bolus feedings via PEG per RN. Continue with current regimen.   No UOP documented.  Medications: pepcid, nutrisource fiber BID, folic acid, thiamine Labs: CBG 95-108  Diet Order:   Diet Order    None      EDUCATION NEEDS:   Not appropriate for education at this time  Skin:  Skin Assessment: Skin Integrity  Issues: Skin Integrity Issues:: Other (Comment) Stage II: N/A Stage III: N/A Other: puncture abdomen (PEG insertion site)  Last BM:  1/9  Height:   Ht Readings from Last 1 Encounters:  11/01/19 5' 6"  (1.676 m)    Weight:   Wt Readings from Last 1 Encounters:  02/13/20 70.5 kg    BMI:  Body mass index is 25.08 kg/m.  Estimated Nutritional Needs:   Kcal:  2400-2600  Protein:  120-135 grams  Fluid:  >2L/d  Maliya Marich Larkin InaD, LDN RD pager number and weekend/on-call pager number located in [email or web address]

## 2020-02-13 NOTE — Plan of Care (Signed)
  Problem: Clinical Measurements: Goal: Neurologic status will improve Outcome: Progressing  Alert/oriented to self  Problem: Respiratory: Goal: Will regain and/or maintain adequate ventilation Outcome: Progressing   Problem: Skin Integrity: Goal: Risk for impaired skin integrity will decrease Outcome: Progressing   Problem: Nutritional: Goal: Risk of aspiration will decrease Outcome: Progressing

## 2020-02-13 NOTE — Progress Notes (Signed)
Physical Therapy Treatment Patient Details Name: Ryan Orozco MRN: [id number] DOB: 1965/05/27 Today's Date: 02/13/2020    History of Present Illness 55 year old male found down outside convenience store 9/27. Pt with PEA and hypoxic brain injury. Trach 10/13, PEG 10/15. PMHx: HIV AIDS and alcohol abuse.  CIR physician consulted for spacticity management and s/p botox injections 02/08/20.    PT Comments    Worked EOB with truncal rotation and extension and bringing R knee to neutral hip level. Pt remains dependent for all mobility and non-verbal with exception of moaning. Pt did relax once in supine and was able to achieve L HIP and knee extension and R hip extension past 90, minimal knee extension tolerance. Pt awake, alert, and moaning out loud upon arrival and then upon PT leaving pt relaxed and sleeping.Acute PT to cont to follow.    Follow Up Recommendations  SNF     Equipment Recommendations  Hospital bed;Wheelchair (measurements PT);Other (comment);Wheelchair cushion (measurements PT) (hoyer lift, air mattress)    Recommendations for Other Services       Precautions / Restrictions Precautions Precautions: Fall Precaution Comments: trach, PEG, anoxic, flexor tone R>L LE/UE Required Braces or Orthoses: Other Brace Other Brace: WHOs bil Restrictions Weight Bearing Restrictions: No    Mobility  Bed Mobility Overal bed mobility: Needs Assistance Bed Mobility: Rolling Rolling: Total assist Sidelying to sit: Total assist Supine to sit: Total assist;+2 for physical assistance;+2 for safety/equipment Sit to supine: Total assist;+2 for physical assistance;+2 for safety/equipment Sit to sidelying: Max assist General bed mobility comments: pt with no self control or intiation with tasks asked  Transfers                    Ambulation/Gait             General Gait Details: unable   Stairs             Wheelchair Mobility    Modified Rankin (Stroke  Patients Only)       Balance Overall balance assessment: Needs assistance Sitting-balance support: Feet supported;No upper extremity supported Sitting balance-Leahy Scale: Zero Sitting balance - Comments: pt total A to maintain EOB balance, worked on trunk and head extension in addition to R hip extension (manual cues to bring R knee down to neutral hip) Postural control: Posterior lean                                  Cognition Arousal/Alertness: Awake/alert Behavior During Therapy: Flat affect Overall Cognitive Status: Impaired/Different from baseline Area of Impairment: Following commands;Problem solving;Awareness;Safety/judgement;Attention               Rancho Levels of Cognitive Functioning Ranch[place] Amigos Scales of Cognitive Functioning: Localized response   Current Attention Level: Focused Memory: Decreased short-term memory;Decreased recall of precautions Following Commands: Follows one step commands inconsistently;Follows one step commands with increased time Safety/Judgement: Decreased awareness of deficits;Decreased awareness of safety Awareness: Intellectual Problem Solving: Slow processing;Decreased initiation;Difficulty sequencing;Requires verbal cues;Requires tactile cues General Comments: pt awake and moaning, pt appears to attempt to follow commands with UEs but not obvious command follow      Exercises      General Comments General comments (skin integrity, edema, etc.): worked on R LE ROM into extension, L LE more responsive and able to achieve extension      Pertinent Vitals/Pain Pain Assessment: Faces Faces Pain Scale: Hurts whole lot Pain Location: R  LE stretching into extension Pain Descriptors / Indicators: Grimacing;Moaning Pain Intervention(s): Monitored during session    Home Living                      Prior Function            PT Goals (current goals can now be found in the care plan section) Acute Rehab PT  Goals Patient Stated Goal: unable to state Progress towards PT goals: Not progressing toward goals - comment    Frequency    Min 2X/week      PT Plan Current plan remains appropriate    Co-evaluation              AM-PAC PT "6 Clicks" Mobility   Outcome Measure  Help needed turning from your back to your side while in a flat bed without using bedrails?: Total Help needed moving from lying on your back to sitting on the side of a flat bed without using bedrails?: Total Help needed moving to and from a bed to a chair (including a wheelchair)?: Total Help needed standing up from a chair using your arms (e.g., wheelchair or bedside chair)?: Total Help needed to walk in hospital room?: Total Help needed climbing 3-5 steps with a railing? : Total 6 Click Score: 6    End of Session Equipment Utilized During Treatment: Oxygen Activity Tolerance: Patient limited by fatigue Patient left: in bed;with call bell/phone within reach;with bed alarm set (re-positioned on R side with L LE in extension) Nurse Communication: Mobility status PT Visit Diagnosis: Other abnormalities of gait and mobility (R26.89);Muscle weakness (generalized) (M62.81)     Time: 6578-4696 PT Time Calculation (min) (ACUTE ONLY): 24 min  Charges:  $Therapeutic Exercise: 8-22 mins $Therapeutic Activity: 8-22 mins                     Lewis Shock, PT, DPT Acute Rehabilitation Services Pager #: [phone] Office #: [phone]    Iona Hansen 02/13/2020, 1:41 PM

## 2020-02-13 NOTE — Plan of Care (Signed)
  Problem: Education: Goal: Knowledge of General Education information will improve Description: Including pain rating scale, medication(s)/side effects and non-pharmacologic comfort measures Outcome: Progressing   Problem: Health Behavior/Discharge Planning: Goal: Ability to manage health-related needs will improve Outcome: Progressing   Problem: Clinical Measurements: Goal: Ability to maintain clinical measurements within normal limits will improve Outcome: Progressing Goal: Will remain free from infection Outcome: Progressing Goal: Diagnostic test results will improve Outcome: Progressing Goal: Respiratory complications will improve Outcome: Progressing Goal: Cardiovascular complication will be avoided Outcome: Progressing   Problem: Activity: Goal: Risk for activity intolerance will decrease Outcome: Progressing   Problem: Nutrition: Goal: Adequate nutrition will be maintained Outcome: Progressing   Problem: Coping: Goal: Level of anxiety will decrease Outcome: Progressing   Problem: Elimination: Goal: Will not experience complications related to bowel motility Outcome: Progressing Goal: Will not experience complications related to urinary retention Outcome: Progressing   Problem: Pain Managment: Goal: General experience of comfort will improve Outcome: Progressing   Problem: Safety: Goal: Ability to remain free from injury will improve Outcome: Progressing   Problem: Skin Integrity: Goal: Risk for impaired skin integrity will decrease Outcome: Progressing   Problem: Education: Goal: 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 Outcome: Progressing   Problem: Activity: Goal: Ability to tolerate increased activity will improve Outcome: Progressing   Problem: Health Behavior/Discharge Planning: Goal: 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 Outcome: Progressing   Problem: Respiratory: Goal: Patent airway maintenance will  improve Outcome: Progressing   Problem: Role Relationship: Goal: Ability to communicate will improve Outcome: Progressing   Problem: Education: Goal: Knowledge of the prescribed therapeutic regimen Outcome: Progressing Goal: Knowledge of disease or condition will improve Outcome: Progressing   Problem: Clinical Measurements: Goal: Neurologic status will improve Outcome: Progressing   Problem: Tissue Perfusion: Goal: Ability to maintain intracranial pressure will improve Outcome: Progressing   Problem: Respiratory: Goal: Will regain and/or maintain adequate ventilation Outcome: Progressing   Problem: Skin Integrity: Goal: Risk for impaired skin integrity will decrease Outcome: Progressing Goal: Demonstration of wound healing without infection will improve Outcome: Progressing   Problem: Psychosocial: Goal: Ability to verbalize positive feelings about self will improve Outcome: Progressing Goal: Ability to participate in self-care as condition permits will improve Outcome: Progressing Goal: Ability to identify appropriate support needs will improve Outcome: Progressing   Problem: Health Behavior/Discharge Planning: Goal: Ability to manage health-related needs will improve Outcome: Progressing   Problem: Nutritional: Goal: Risk of aspiration will decrease Outcome: Progressing Goal: Dietary intake will improve Outcome: Progressing   Problem: Communication: Goal: Ability to communicate needs accurately will improve Outcome: Progressing

## 2020-02-14 ENCOUNTER — Inpatient Hospital Stay (HOSPITAL_COMMUNITY): Payer: Medicaid Other

## 2020-02-14 LAB — GLUCOSE, CAPILLARY
Glucose-Capillary: 93 mg/dL (ref 70–99)
Glucose-Capillary: 91 mg/dL (ref 70–99)
Glucose-Capillary: 120 mg/dL — ABNORMAL HIGH (ref 70–99)
Glucose-Capillary: 105 mg/dL — ABNORMAL HIGH (ref 70–99)
Glucose-Capillary: 200 mg/dL — ABNORMAL HIGH (ref 70–99)
Glucose-Capillary: 130 mg/dL — ABNORMAL HIGH (ref 70–99)

## 2020-02-14 LAB — CBC
HCT: 43 % (ref 39.0–52.0)
Hemoglobin: 14.2 g/dL (ref 13.0–17.0)
MCH: 31.4 pg (ref 26.0–34.0)
MCHC: 33 g/dL (ref 30.0–36.0)
MCV: 95.1 fL (ref 80.0–100.0)
Platelets: 174 10*3/uL (ref 150–400)
RBC: 4.52 MIL/uL (ref 4.22–5.81)
RDW: 12.8 % (ref 11.5–15.5)
WBC: 5.8 10*3/uL (ref 4.0–10.5)
nRBC: 0 % (ref 0.0–0.2)

## 2020-02-14 MED ORDER — VANCOMYCIN HCL 1500 MG/300ML IV SOLN
1500.0000 mg | Freq: Once | INTRAVENOUS | Status: AC
  Administered 2020-02-14: 1500 mg via INTRAVENOUS
  Filled 2020-02-14: qty 300

## 2020-02-14 MED ORDER — ACETAMINOPHEN 160 MG/5ML PO SOLN
650.0000 mg | Freq: Four times a day (QID) | ORAL | Status: DC | PRN
  Administered 2020-02-14 – 2020-03-16 (×5): 650 mg
  Filled 2020-02-14 (×2): qty 20.3

## 2020-02-14 MED ORDER — PIPERACILLIN-TAZOBACTAM 3.375 G IVPB
3.3750 g | Freq: Three times a day (TID) | INTRAVENOUS | Status: DC
  Administered 2020-02-15 – 2020-02-16 (×5): 3.375 g via INTRAVENOUS
  Filled 2020-02-14 (×5): qty 50

## 2020-02-14 MED ORDER — VANCOMYCIN HCL IN DEXTROSE 1-5 GM/200ML-% IV SOLN
1000.0000 mg | Freq: Two times a day (BID) | INTRAVENOUS | Status: DC
  Administered 2020-02-15 (×2): 1000 mg via INTRAVENOUS
  Filled 2020-02-14 (×3): qty 200

## 2020-02-14 NOTE — Progress Notes (Addendum)
Occupational Therapy Treatment  Pt seen in afternoon for splint check after 4 hours of B resting hand splint wear. No skin integrity concerns noted from splint wear but excessive skin residual noted on hands/splints. OT assisted in cleaning splints (placed in bathroom to dry) and washing B hands at Total A. Pt more restless this afternoon on OT entry with involuntary/incoordinated movements of B UE (L > R), difficulty attending to tasks and following directions at this time. OT splitning schedule of B resting hand splints for 4 hours on/off remains appropriate. Plan to assess R elbow and possible need for splinting due to lacking extension, as well as progression of bed mobility/trunk control.     02/14/20 1400  OT Visit Information  Last OT Received On 02/14/20  Assistance Needed +2  History of Present Illness 55 year old male found down outside convenience store 9/27. Pt with PEA and hypoxic brain injury. Trach 10/13, PEG 10/15. PMHx: HIV AIDS and alcohol abuse.  CIR physician consulted for spacticity management and s/p botox injections 02/08/20.  Precautions  Precautions Fall  Precaution Comments trach, PEG, anoxic, flexor tone R>L LE/UE  Required Braces or Orthoses Other Brace  Other Brace bilateral resting hand splints  Pain Assessment  Pain Assessment Faces  Faces Pain Scale 4  Pain Location generalized  Pain Descriptors / Indicators Grimacing;Moaning;Other (Comment) (fidgety)  Pain Intervention(s) Monitored during session;Other (comment) (notified RN)  Cognition  Arousal/Alertness Awake/alert  Behavior During Therapy Flat affect;Restless  Overall Cognitive Status Impaired/Different from baseline  Area of Impairment Following commands;Problem solving;Awareness;Safety/judgement;Attention  Current Attention Level Focused  Memory Decreased short-term memory;Decreased recall of precautions  Following Commands Follows one step commands inconsistently;Follows one step commands with  increased time  Safety/Judgement Decreased awareness of deficits;Decreased awareness of safety  Awareness Intellectual  Problem Solving Slow processing;Decreased initiation;Difficulty sequencing;Requires verbal cues;Requires tactile cues  General Comments Noted to be fidgety with decreased attention to tasks at this time  Difficult to assess due to Impaired communication;Tracheostomy  ADL  Overall ADL's  Needs assistance/impaired  Grooming Bed level;Wash/dry face;Wash/dry hands;Total assistance  Grooming Details (indicate cue type and reason) Attempted to guide pt in washing face but pt too fidgety and restless movements to attend to task or follow commands. Total A for hand hygiene after resting hand splint wear  General ADL Comments Decreased attention span and more restless in PM for splint check  Restrictions  Weight Bearing Restrictions No  Other Position/Activity Restrictions skin integrity  Rancho Levels of Cognitive Functioning  Rancho Los Amigos Scales of Cognitive Functioning III  General Comments  General comments (skin integrity, edema, etc.) Focused on splint check with no concerns noted. Notified RN on pt's restless behavior, unsure if due to pain but RN reports pt more restless after receiving bathing assistance after lunch  OT - End of Session  Equipment Utilized During Treatment Oxygen  Activity Tolerance Patient tolerated treatment well  Patient left in bed;with call bell/phone within reach;with nursing/sitter in room  Nurse Communication Mobility status;Other (comment) (splints)  OT Assessment/Plan  OT Plan Discharge plan remains appropriate  OT Visit Diagnosis Cognitive communication deficit (R41.841);Muscle weakness (generalized) (M62.81);Pain;Hemiplegia and hemiparesis  Symptoms and signs involving cognitive functions Other cerebrovascular disease  Hemiplegia - Right/Left Right  Hemiplegia - dominant/non-dominant Dominant  Hemiplegia - 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 1  Help from another person taking care of personal grooming? 1  Help from another person toileting, which includes using toliet, bedpan, or urinal? 1  Help from another person bathing (including washing, rinsing, drying)? 1  Help from another person to put on and taking off regular upper body clothing? 1  Help from another person to put on and taking off regular lower body clothing? 1  6 Click Score 6  OT Goal Progression  Progress towards OT goals Progressing toward goals  Acute Rehab OT Goals  Patient Stated Goal unable to state  OT Goal Formulation Patient unable to participate in goal setting  Time For Goal Achievement 02/19/20  Potential to Achieve Goals Fair  ADL Goals  Additional ADL Goal #1 Pt will follow one step appendicular commands 3/5 trials  Additional ADL Goal #2 Pt will wash face with moderate assist/hand over hand assist  Additional ADL Goal #3 Family will be independent with PROM and positioning bil. UEs  Additional ADL Goal #4 Pt will maintain EOB sitting with max A in prep for functional mobility  OT Time Calculation  OT Start Time (ACUTE ONLY) 1406  OT Stop Time (ACUTE ONLY) 1418  OT Time Calculation (min) 12 min  OT General Charges  $OT Visit 1 Visit  OT Treatments  $Therapeutic Activity 8-22 mins

## 2020-02-14 NOTE — Progress Notes (Signed)
[facility] PROGRESS NOTE  [place] BHA:[id number] DOB: 12-Jul-1965 DOA: 11/01/2019 PCP: Default, Provider, MD    11/16   Status: Remains inpatient appropriate because:Altered mental status, Unsafe d/c plan and Inpatient level of care appropriate due to severity of illness. Patient newly diagnosed with anoxic brain injury  Dispo: The patient is from: Home              Anticipated d/c is to: SNF              Anticipated d/c date is: > 3 days              Patient currently is medically stable to d/c.  Barriers to discharge: No SNF bed offers.  Needs trach capable facility. TOC communicating with financial counseling team who have yet to hear back from DSS regarding patient's assigned caseworker.  TOC spoke with [facility] rehab.  Patient would be an excellent candidate but they request he have active Medicaid and disability before they can accept him.  Also faxed out information to [facility] as of 1/10   Code Status: Full Family Communication: 1/06 dtr [name] updated DVT prophylaxis: Subcutaneous heparin Vaccination status: Will order first dose Pfizer COVID-vaccine on 1/10; next week we will order pneumococcal and influenza vaccinations  Foley catheter:  No  HPI: 54 year old male with HIV, chronic alcohol abuse who was presented to the emergency department after being found unresponsive outside a convinient store.  EMS found him pulseless in PEA, unknown downtime, successfully resuscitated and brought to the emergency department.  UDS positive for THC and benzodiazepines.  CT head unremarkable.  Failed extubation trials due to severe anoxic brain injury and had tracheostomy placed.  PEG placed on 10/15.  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  This has led to significant pain as well.  [name] was consulted and recommendations/input/orders highly appreciated.  She may also benefit  from Botox injections to both hips to aid in treatment of spasticity.   Significant Hospital Events   9/29 Admit post PEA arrest. UDS positive for THC, benzo's. ETOH 177 10/05 EEG ongoing. Versed restarted overnight due to agitation. Nicardipine increased.  10/06 Developed tachypnea with WUA, no follow commands off sedation  10/07 Vomiting, TF held / restarted with recurrent vomiting 10/16 tracheostomy collar for 9 hours 10/18->10/19 off vent all night  10/20> TC 11/15 > downsized to #4 Shiley flex CFS  Subjective: Awake. OT working with patient at bedside  Objective: Vitals:   02/14/20 0731 02/14/20 1106  BP:    Pulse: 89 (!) 102  Resp: 18 18  Temp:    SpO2: 95% 98%    Intake/Output Summary (Last 24 hours) at 02/14/2020 1218 Last data filed at 02/14/2020 0409 Gross per 24 hour  Intake -  Output 1750 ml  Net -1750 ml   Filed Weights   02/09/20 0518 02/10/20 0500 02/13/20 0750  Weight: 74 kg 74.5 kg 70.5 kg    Exam: General: Awake with limited verbal response but no acute distress other than possible mild discomfort Pulmonary: Anterior lung sounds clear to auscultation, trach with minimal drainage.  Humidified room air via trach collar Cardiac: Regular pulse, normal heart sounds S1-S2 with no murmurs or rubs, no peripheral edema Abdomen: PEG site unremarkable, abdomen soft and nontender to palpation with normoactive bowel sounds, LBM 1/09 Neurological: Awake interactive today. Continues with hypertonicity worse in the lower extremities involving the hip flexors and hamstrings noting has received  Botox injection on 1/6. Of note PT/OT able to extend right leg from knee 90 degrees on 1/11. Has limited use of upper extremities with action primarily initiated from the shoulder girth.  Assessment/Plan: Acute problems: Anoxic brain injury 2/2 PEA arrest/pain syndrome secondary to hypertonicity/spastic tetraplegia:  -Presumed 2/2 combination alcohol and BZD overdose with UDS  positive for THC and BZDs -Significant brain injury with underlying spastic tetraplegia long-term SNF placement recommended-continue PT/OT/SLP -Continue baclofen, gabapentin, scheduled oxycodone and Zanaflex  -Appreciate assistance of Dr. Kathleene Hazel dose has been uptitrated to 6 mg every 6 hours -1/6 Botox injection into the quadriceps and rectus femoris as well as the biceps femoris and hamstrings, semimembranosus and semitendinosus.  Dr. Eda Keys has documented that Botox typically takes 7 to 10 days before effect is seen.  Unable to inject iliopsoas without EMG guidance. -Continue bilateral upper extremity WHO resting splints along with PROM per nursing staff every 4 hours -Continue low-dose Seroquel for brain injury sequela -Continue scheduled Tylenol for pain along with Crea muscle rub -Continue KREG rotational bed   Chronic respiratory failure with inability to maintain patent airway requiring chronic tracheostomy tube/Recent Corynebacterium tracheobronchitis (resolved) -Continue PMV training per SLP-#4 cuffless trach in place-trach changed out on 1/10 RT -Not a candidate for decannulation until patient can reliably follow commands, cough and clear airway and it appears current mentation will likely be patient's new baseline -continue physiotherapy with Vibra vest along with supervised PMV trials  HIV:  -CD4 count of 124 October 2021 (had been as high as 250 Dec 2020)-as of today up to 259 -Dc'd Tivicay and Descovy infavor of Biktarvy to for eventual dc to SNF; per my discussion with infectious disease physician [name] on 12/23 preferred regimen would be Descovy and Tivicay-she will contact the HIV pharmacist to attempt to obtain discount cards to make this a more affordable option.  Once this has been confirmed will transition back to Descovy and Tivicay -CD4 count on 12/23 up to 259 -ID recommends continuing prophylactic Bactrim an additional 3 months  Dysphagia 2/2 anoxic brain  injury -Continue tube feeding per PEG -SLP attempted to introduce oral substances on 12/27 but patient did not accept and was pushing out with his tongue.  Given his acute UTI he may have been nauseated and this may have been reason for him not wishing to eat.  Protein calorie malnutrition nutrition Status: Nutrition Problem: Increased nutrient needs Etiology: acute illness Signs/Symptoms: estimated needs Interventions: Tube feeding bolus doses of Osmolite, Juven twice daily, Prosource TF 45 mL 3 times daily Estimated body mass index is 25.08 kg/m as calculated from the following:   Height as of this encounter: _0  (1.676 m).   Weight as of this encounter: 70.5 kg.   Multiple decubiti not POA Wound / Incision (Open or Dehisced) 11/17/19 Puncture Abdomen Left;Anterior;Upper G-tube insertion site  (Active)  Date First Assessed/Time First Assessed: 11/17/19 1646   Wound Type: Puncture  Location: Abdomen  Location Orientation: Left;Anterior;Upper  Wound Description (Comments): G-tube insertion site   Present on Admission: No    Assessments 11/17/2019  4:47 PM 02/12/2020  9:02 PM  Dressing Type Gauze (Comment);Tape dressing Gauze (Comment)  Dressing Changed New -  Dressing Status Clean;Dry;Intact Clean;Dry;Intact  Dressing Change Frequency PRN -  Site / Wound Assessment Clean;Dry;Pink Clean;Dry;Pink  Peri-wound Assessment Intact -  Closure None -  Drainage Amount None -  Treatment Cleansed -     No Linked orders to display      Other problems: Hypertension/grade  1 diastolic dysfunction:  -Continue amlodipine  -Echocardiogram this admission with preserved LVEF with evidence of grade   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   -Continue Keppra; levetiracetam level 35.9 on 11/12 -no further seizure activity since transitioned out of ICU setting therefore will discontinue seizure precaution  Inguinal hernias -Evaluated by general surgery this admission.  Documented as chronically incarcerated. Patient has been clinically stable and tolerating tube feedings and having bowel movements so unless patient obstructed would not be considered a candidate for repair. Even if obstructed consulting surgeon was not sure he would be a candidate for hernia repair regardless . Enterococcus faecalis UTI -Urine culture positive for pansensitive Enterococcus-empiric Levaquin discontinued on 12/30 in favor of Augmentin and completed 5 days of therapy  Goals of care:  -Palliative care was involved during this hospitalization.   -Goals of care were discussed. remains full code.  -Palliative care recommended outpatient follow-up with palliative providers. -May need to revisit end-of-life goals of care since patient making poor progress and currently having significant pain related to hypertonicity from brain injury   Data Reviewed: Basic Metabolic Panel: Recent Labs  Lab 02/10/20 0512  NA 143  K 3.8  CL 105  CO2 27  GLUCOSE 106*  BUN 19  CREATININE 0.76  CALCIUM 10.1   Liver Function Tests: No results for input(s): AST, ALT, ALKPHOS, BILITOT, PROT, ALBUMIN in the last 168 hours. No results for input(s): LIPASE, AMYLASE in the last 168 hours. No results for input(s): AMMONIA in the last 168 hours. CBC: Recent Labs  Lab 02/10/20 0512  WBC 4.0  HGB 14.3  HCT 45.6  MCV 96.4  PLT 179   Cardiac Enzymes: No results for input(s): CKTOTAL, CKMB, CKMBINDEX, TROPONINI in the last 168 hours. BNP (last 3 results) No results for input(s): BNP in the last 8760 hours.  ProBNP (last 3 results) No results for input(s): PROBNP in the last 8760 hours.  CBG: Recent Labs  Lab 02/13/20 1936 02/13/20 2308 02/14/20 0348 02/14/20 0722 02/14/20 1146  GLUCAP 111* 98 93 91 120*    No results found for this or any previous visit (from the past 240 hour(s)).   Studies: No results found.  Scheduled Meds: . acetaminophen (TYLENOL) oral liquid 160 mg/5 mL   650 mg Per Tube Q6H  . amLODipine  10 mg Per Tube Daily  . baclofen  30 mg Per Tube TID  . chlorhexidine gluconate (MEDLINE KIT)  15 mL Mouth Rinse BID  . Darunavir-Cobicisctat-Emtricitabine-Tenofovir Alafenamide  1 tablet Oral Q breakfast  . famotidine  10.4 mg Per Tube BID  . feeding supplement (OSMOLITE 1.5 CAL)  237 mL Per Tube Q24H  . feeding supplement (OSMOLITE 1.5 CAL)  474 mL Per Tube TID  . feeding supplement (PROSource TF)  45 mL Per Tube TID  . fiber  1 packet Per Tube BID  . folic acid  1 mg Per Tube Daily  . free water  200 mL Per Tube Q4H  . gabapentin  200 mg Per Tube Q8H  . heparin injection (subcutaneous)  5,000 Units Subcutaneous Q8H  . levETIRAcetam  1,500 mg Per Tube BID  . Muscle Rub   Topical QID  . nystatin  5 mL Oral QID  . oxyCODONE  5 mg Per Tube Q8H  . QUEtiapine  12.5 mg Per Tube QHS  . scopolamine  1 patch Transdermal Q72H  . sulfamethoxazole-trimethoprim  1 tablet Per Tube Once per day on Mon Wed Fri  . thiamine  100 mg Per Tube Daily  . tiZANidine  6 mg Per Tube Q6H   Continuous Infusions:   Active Problems:   Cardiac arrest [facility])   Community acquired pneumonia of left upper lobe of lung   Anoxic brain injury ([place])   Alcohol abuse   Acute respiratory failure with hypoxia (HCC)   Vomiting   Pressure injury of skin   Small bowel obstruction ([place])   Palliative care encounter   Bowel obstruction (HCC)   Chronic respiratory failure (H[facility])   Diastolic dysfunction   Tracheostomy dependent ([place])   Dysphagia   Protein-calorie malnutrition ([place])   Seizures ([place])   Bilateral recurrent inguinal hernia   Muscle spasticity   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HCC)   Tracheobronchitis   Sequela of Corynebacterium infection   Abnormal urinalysis   Urinary tract infection due to Enterococcus   Consultants:  PCCM  Palliative medicine  General surgery  Neurology  Interventional  radiology  Procedures:  EEG  Echocardiogram  Tracheostomy  Antibiotics: Anti-infectives (From admission, onward)   Start     Dose/Rate Route Frequency Ordered Stop   02/01/20 2200  amoxicillin-clavulanate (AUGMENTIN) 250-62.5 MG/5ML suspension 500 mg        500 mg Per Tube Every 8 hours 02/01/20 1343 02/06/20 1333   01/31/20 1015  levofloxacin (LEVAQUIN) 25 MG/ML solution 500 mg  Status:  Discontinued        500 mg Per Tube Daily 01/31/20 0919 02/01/20 1343   01/31/20 1000  cefTRIAXone (ROCEPHIN) 1 g in sodium chloride 0.9 % 100 mL IVPB  Status:  Discontinued        1 g 200 mL/hr over 30 Minutes Intravenous Every 24 hours 01/31/20 0905 01/31/20 0919   01/19/20 2200  linezolid (ZYVOX) tablet 600 mg        600 mg Per Tube Every 12 hours 01/19/20 1534 01/25/20 2019   01/19/20 1100  levofloxacin (LEVAQUIN) tablet 500 mg  Status:  Discontinued        500 mg Per Tube Daily 01/19/20 1004 01/19/20 1534   12/19/19 0930  Darunavir-Cobicisctat-Emtricitabine-Tenofovir Alafenamide (SYMTUZA) 800-150-200-10 MG TABS 1 tablet        1 tablet Oral Daily with breakfast 12/19/19 0840     12/13/19 1000  bictegravir-emtricitabine-tenofovir AF (BIKTARVY) 50-200-25 MG per tablet 1 tablet  Status:  Discontinued        1 tablet Oral Daily 12/12/19 1413 12/19/19 0840   11/17/19 1548  ceFAZolin (ANCEF) 2-4 GM/100ML-% IVPB       Note to Pharmacy: Domenick Bookbinder   : cabinet override      11/17/19 1548 11/18/19 0359   11/16/19 1515  ceFAZolin (ANCEF) IVPB 2g/100 mL premix        2 g 200 mL/hr over 30 Minutes Intravenous To Radiology 11/16/19 1511 11/17/19 1625   11/15/19 0900  sulfamethoxazole-trimethoprim (BACTRIM DS) 800-160 MG per tablet 1 tablet        1 tablet Per Tube Once per day on Mon Wed Fri 11/13/19 1906     11/13/19 1615  dolutegravir (TIVICAY) tablet 50 mg  Status:  Discontinued        50 mg Oral Daily 11/13/19 1521 12/12/19 1413   11/13/19 1615  emtricitabine-tenofovir AF (DESCOVY) 200-25 MG  per tablet 1 tablet  Status:  Discontinued        1 tablet Per Tube Daily 11/13/19 1521 12/12/19 1413   11/13/19 1530  sulfamethoxazole-trimethoprim (BACTRIM DS) 800-160 MG per tablet 1 tablet  Status:  Discontinued        1 tablet Oral Once per day on Mon Wed Fri 11/13/19 1441 11/13/19 1906   11/13/19 1500  bictegravir-emtricitabine-tenofovir AF (BIKTARVY) 50-200-25 MG per tablet 1 tablet  Status:  Discontinued        1 tablet Oral Daily 11/13/19 1403 11/13/19 1521   11/10/19 1000  erythromycin 250 mg in sodium chloride 0.9 % 100 mL IVPB        250 mg 100 mL/hr over 60 Minutes Intravenous Every 8 hours 11/10/19 0907 11/11/19 2000   11/07/19 1200  ceFEPIme (MAXIPIME) 2 g in sodium chloride 0.9 % 100 mL IVPB        2 g 200 mL/hr over 30 Minutes Intravenous Every 8 hours 11/07/19 1013 11/13/19 2127   11/03/19 1200  cefTRIAXone (ROCEPHIN) 2 g in sodium chloride 0.9 % 100 mL IVPB        2 g 200 mL/hr over 30 Minutes Intravenous Every 24 hours 11/03/19 0922 11/05/19 1427   11/02/19 0800  vancomycin (VANCOREADY) IVPB 750 mg/150 mL  Status:  Discontinued        750 mg 150 mL/hr over 60 Minutes Intravenous Every 12 hours 11/01/19 1935 11/02/19 1105   11/02/19 0600  piperacillin-tazobactam (ZOSYN) IVPB 3.375 g  Status:  Discontinued        3.375 g 12.5 mL/hr over 240 Minutes Intravenous Every 8 hours 11/02/19 0311 11/03/19 0920   11/01/19 2200  ceFEPIme (MAXIPIME) 2 g in sodium chloride 0.9 % 100 mL IVPB  Status:  Discontinued        2 g 200 mL/hr over 30 Minutes Intravenous Every 12 hours 11/01/19 2143 11/02/19 0311   11/01/19 1915  vancomycin (VANCOREADY) IVPB 1500 mg/300 mL        1,500 mg 150 mL/hr over 120 Minutes Intravenous  Once 11/01/19 1909 11/01/19 2147   11/01/19 1845  cefTRIAXone (ROCEPHIN) 1 g in sodium chloride 0.9 % 100 mL IVPB        1 g 200 mL/hr over 30 Minutes Intravenous  Once 11/01/19 1842 11/01/19 2051   11/01/19 1845  azithromycin (ZITHROMAX) 500 mg in sodium chloride 0.9  % 250 mL IVPB        500 mg 250 mL/hr over 60 Minutes Intravenous  Once 11/01/19 1842 11/01/19 2051       Time spent: 20 minutes    Erin Hearing ANP  [facility]  7 am-330 pm/M-F for direct patient care and secure chat Please refer to Amion for contact information 02/14/2020, 12:18 PM  LOS: 105 days

## 2020-02-14 NOTE — Progress Notes (Signed)
Occupational Therapy Treatment Patient Details Name: Ryan Orozco MRN: [id number] DOB: 1965/08/19 Today's Date: 02/14/2020    History of present illness 55 year old male found down outside convenience store 9/27. Pt with PEA and hypoxic brain injury. Trach 10/13, PEG 10/15. PMHx: HIV AIDS and alcohol abuse.  CIR physician consulted for spacticity management and s/p botox injections 02/08/20.   OT comments  Session focused on continued assessment of resting hand splint wearing schedule and AAROM of B UE. Pt able to follow <50% of one step commands for AAROM though improving. More active movement noted with shoulder movements in comparison to distal muscles/joints. Applied B resting hand splints noted to fit well with plans to assess in 4 hours and assist in carryover of splinting schedule. Pt noted with increased R elbow stiffness, lacking about 20* extension - plan to further assess if elbow splint also warranted.    Follow Up Recommendations  SNF;Supervision/Assistance - 24 hour    Equipment Recommendations  Hospital bed;Wheelchair (measurements OT);Wheelchair cushion (measurements OT)    Recommendations for Other Services      Precautions / Restrictions Precautions Precautions: Fall Precaution Comments: trach, PEG, anoxic, flexor tone R>L LE/UE Required Braces or Orthoses: Other Brace Other Brace: bilateral resting hand splints Restrictions Weight Bearing Restrictions: No Other Position/Activity Restrictions: skin integrity       Mobility Bed Mobility                  Transfers                      Balance                                           ADL either performed or assessed with clinical judgement   ADL Overall ADL's : Needs assistance/impaired Eating/Feeding: NPO Eating/Feeding Details (indicate cue type and reason): peg Grooming: Maximal assistance;Bed level Grooming Details (indicate cue type and reason): Assessed ability  to bring hand to mouth/face, incoordination of R UE - able to bring across abdomen but assistance to move further. With L UE PROM, pt noted with reflex of opening mouth when L UE nearby                               General ADL Comments: Improvng command following with AAROM of B UE, improving appropriateness of responses (laughing at PA's joke)     Vision   Additional Comments: Pt able to focus gaze, make eye contact   Perception     Praxis      Cognition Arousal/Alertness: Awake/alert Behavior During Therapy: Flat affect Overall Cognitive Status: Impaired/Different from baseline Area of Impairment: Following commands;Problem solving;Awareness;Safety/judgement;Attention               Rancho Levels of Cognitive Functioning Rancho Los Amigos Scales of Cognitive Functioning: Localized response   Current Attention Level: Focused Memory: Decreased short-term memory;Decreased recall of precautions Following Commands: Follows one step commands inconsistently;Follows one step commands with increased time Safety/Judgement: Decreased awareness of deficits;Decreased awareness of safety Awareness: Intellectual Problem Solving: Slow processing;Decreased initiation;Difficulty sequencing;Requires verbal cues;Requires tactile cues General Comments: Pt awake, laughed at PA's joke in room. Makes eye contact with OT during PROM. Able to follow about 40% of AAROM commands for B UE        Exercises  Shoulder Instructions       General Comments Session focused on assessment of B UE resting hand splint schedule. Applied resting hand splints with OT to check back for skin integrity and check in with staff    Pertinent Vitals/ Pain       Pain Assessment: Faces Faces Pain Scale: Hurts even more Pain Location: R LE with extension stretching by PA in room Pain Descriptors / Indicators: Grimacing Pain Intervention(s): Monitored during session  Home Living                                           Prior Functioning/Environment              Frequency  Min 2X/week        Progress Toward Goals  OT Goals(current goals can now be found in the care plan section)  Progress towards OT goals: OT to reassess next treatment  Acute Rehab OT Goals Patient Stated Goal: unable to state OT Goal Formulation: Patient unable to participate in goal setting Time For Goal Achievement: 02/19/20 Potential to Achieve Goals: Fair ADL Goals Additional ADL Goal #1: Pt will follow one step appendicular commands 3/5 trials Additional ADL Goal #2: Pt will wash face with moderate assist/hand over hand assist Additional ADL Goal #3: Family will be independent with PROM and positioning bil. UEs Additional ADL Goal #4: Pt will maintain EOB sitting with max A in prep for functional mobility  Plan Discharge plan remains appropriate    Co-evaluation                 AM-PAC OT "6 Clicks" Daily Activity     Outcome Measure   Help from another person eating meals?: Total Help from another person taking care of personal grooming?: Total Help from another person toileting, which includes using toliet, bedpan, or urinal?: Total Help from another person bathing (including washing, rinsing, drying)?: Total Help from another person to put on and taking off regular upper body clothing?: Total Help from another person to put on and taking off regular lower body clothing?: Total 6 Click Score: 6    End of Session Equipment Utilized During Treatment: Oxygen  OT Visit Diagnosis: Cognitive communication deficit (R41.841);Muscle weakness (generalized) (M62.81);Pain;Hemiplegia and hemiparesis Symptoms and signs involving cognitive functions: Other cerebrovascular disease Hemiplegia - Right/Left: Right Hemiplegia - dominant/non-dominant: Dominant Hemiplegia - caused by: Other cerebrovascular disease Pain - Right/Left: Right Pain - part of body:  Shoulder;Arm;Hand;Leg   Activity Tolerance Patient tolerated treatment well   Patient Left in bed;with call bell/phone within reach;with nursing/sitter in room   Nurse Communication Other (comment) (RN present during session)        Time: 5400-8676 OT Time Calculation (min): 15 min  Charges: OT General Charges $OT Visit: 1 Visit OT Treatments $Therapeutic Activity: 8-22 mins  Lorre Munroe, OTR/L   Lorre Munroe 02/14/2020, 9:59 AM

## 2020-02-14 NOTE — Progress Notes (Signed)
Overnight event  Patient febrile with axillary temperature 100.4 F, rectal temperature 101.8 F.  Heart rate in the 90-100.  Not hypotensive.  Not tachypneic.  Has chronic respiratory failure requiring chronic tracheostomy, satting in the upper 90s on 5 L supplemental oxygen, no acute change.  -Tylenol as needed for fevers -Check lactic acid level, WBC count, procalcitonin level -Blood culture x2 ordered -Chest x-ray ordered -Recently finished treatment for Enterococcus faecalis UTI.  Repeat UA and urine culture ordered. -Broad-spectrum antibiotics (vancomycin and Zosyn) started at this time until source of infection can be determined.

## 2020-02-14 NOTE — Progress Notes (Signed)
Pharmacy Antibiotic Note  Ryan Orozco is a 55 y.o. male admitted on 11/01/2019 with anoxic brain injury secondary to PEA arrest. Pt has significant brain injury with underlying spastic tetraplegia - planned for long term SNF placement. Pt also with chronic respiratory failure with inability to maintain patent airway requiring chronic tracheostomy tube. Tonight, pt is febrile with axillary temp 100.52F, rectal temp 101.84F, HR 90-100. Pt recently completed abx tx for pan-sensitive E. faecalis UTI. Pharmacy has been consulted for vancomycin and Zosyn dosing for empiric broad-spectrum coverage until source of infection can be determined.  WBC 4.0, temp as listed above; Scr 0.76, CrCl 94.1 ml/min (CrCl likely an overestimate of renal function, due to tetraplegia)  Plan: Zosyn 3.375 gm IV Q 8 hrs (extended infusion) Vancomycin 1500 mg IV X 1, followed by vancomycin 1000 mg IV Q 12 hrs (estimated vancomycin AUC on this regimen, using Scr 0.80, is 477.3; goal vancomycin AUC is 400-550) Monitor WBC, temp, clinical improvement, cultures, renal function (monitor renal function closely, due to tetraplegia and likely overestimation of renal function with CrCl), vancomycin levels as indicated F/U lactic acid, procalcitonin  Height: 5\' 6"  (167.6 cm) Weight: 70.5 kg (155 lb 6.4 oz) IBW/kg (Calculated) : 63.8  Temp (24hrs), Avg:99.7 F (37.6 C), Min:97.9 F (36.6 C), Max:101.8 F (38.8 C)  Recent Labs  Lab 02/10/20 0512  WBC 4.0  CREATININE 0.76    Estimated Creatinine Clearance: 94.1 mL/min (by C-G formula based on SCr of 0.76 mg/dL).    Allergies  Allergen Reactions  . Tomato Hives    Antimicrobials this admission: See Epic for previous abx, given pt's long length of stay 1/12 Vancomycin >> 1/12 Zosyn >>  Microbiology results: See Epic for previous micro results, given pt's long length of stay 1/12 Urine cx: pending 1/12 Bld cx X 2: pending  Thank you for allowing pharmacy to be a  part of this patient's care.  3/12, PharmD, BCPS, [facility] Clinical Pharmacist 02/14/2020 10:09 PM

## 2020-02-14 NOTE — Plan of Care (Signed)
  Problem: Respiratory: Goal: Patent airway maintenance will improve Outcome: Progressing   Problem: Nutritional: Goal: Risk of aspiration will decrease Outcome: Progressing   Problem: Nutritional: Goal: Dietary intake will improve Outcome: Progressing

## 2020-02-14 NOTE — TOC Progression Note (Signed)
Transition of Care [facility]) - Progression Note    Patient Details  Name: Ryan Orozco MRN: [id number] Date of Birth: April 11, 1965  Transition of [facility]) CM/SW Contact  Janae Bridgeman, RN Phone Number: 02/14/2020, 12:58 PM  Clinical Narrative:    Case management called [facility] to speak with admission regarding possible SNF placement and the admissions director, Burna Mortimer, is out sick with COVID at this time.  I was transferred to [facility], RNCM, DON and was unable to leave a message since her mailbox was full at this time.  Case management will continue to follow the patient for SNF placement opportunities.   Expected Discharge Plan: Skilled Nursing Facility Barriers to Discharge: Continued Medical Work up,SNF Pending bed offer  Expected Discharge Plan and Services Expected Discharge Plan: Skilled Nursing Facility   Discharge Planning Services: CM Consult Post Acute Care Choice: Nursing Home Living arrangements for the past 2 months: Single Family Home                                       Social Determinants of Health (SDOH) Interventions    Readmission Risk Interventions Readmission Risk Prevention Plan 02/06/2020  Transportation Screening Complete  PCP or Specialist Appt within 3-5 Days Complete  HRI or Home Care Consult Complete  Social Work Consult for Recovery Care Planning/Counseling Complete  Palliative Care Screening Complete  Medication Review Oceanographer) Complete  Some recent data might be hidden

## 2020-02-14 NOTE — Plan of Care (Signed)
  Problem: Education: Goal: Knowledge of General Education information will improve Description: Including pain rating scale, medication(s)/side effects and non-pharmacologic comfort measures Outcome: Not Progressing   

## 2020-02-15 ENCOUNTER — Inpatient Hospital Stay (HOSPITAL_COMMUNITY): Payer: Medicaid Other

## 2020-02-15 DIAGNOSIS — R5081 Fever presenting with conditions classified elsewhere: Secondary | ICD-10-CM

## 2020-02-15 DIAGNOSIS — R509 Fever, unspecified: Secondary | ICD-10-CM

## 2020-02-15 LAB — URINALYSIS, ROUTINE W REFLEX MICROSCOPIC
Bilirubin Urine: NEGATIVE
Glucose, UA: NEGATIVE mg/dL
Hgb urine dipstick: NEGATIVE
Ketones, ur: NEGATIVE mg/dL
Nitrite: NEGATIVE
Protein, ur: NEGATIVE mg/dL
Specific Gravity, Urine: 1.017 (ref 1.005–1.030)
pH: 5 (ref 5.0–8.0)

## 2020-02-15 LAB — GLUCOSE, CAPILLARY
Glucose-Capillary: 118 mg/dL — ABNORMAL HIGH (ref 70–99)
Glucose-Capillary: 121 mg/dL — ABNORMAL HIGH (ref 70–99)
Glucose-Capillary: 129 mg/dL — ABNORMAL HIGH (ref 70–99)
Glucose-Capillary: 94 mg/dL (ref 70–99)
Glucose-Capillary: 154 mg/dL — ABNORMAL HIGH (ref 70–99)

## 2020-02-15 LAB — CREATININE, SERUM
Creatinine, Ser: 0.77 mg/dL (ref 0.61–1.24)
GFR, Estimated: >60 mL/min (ref >60)

## 2020-02-15 LAB — LACTATE DEHYDROGENASE: LDH: 173 U/L (ref 98–192)

## 2020-02-15 LAB — LACTIC ACID, PLASMA
Lactic Acid, Venous: 2.2 mmol/L (ref 0.5–1.9)
Lactic Acid, Venous: 2.5 mmol/L (ref 0.5–1.9)
Lactic Acid, Venous: 2.1 mmol/L (ref 0.5–1.9)

## 2020-02-15 LAB — PROCALCITONIN: Procalcitonin: <0.1 ng/mL

## 2020-02-15 LAB — BRAIN NATRIURETIC PEPTIDE: B Natriuretic Peptide: 3.9 pg/mL (ref 0.0–100.0)

## 2020-02-15 MED ORDER — FREE WATER
200.0000 mL | Status: DC
  Administered 2020-02-15 – 2020-02-17 (×23): 200 mL

## 2020-02-15 MED ORDER — SODIUM CHLORIDE 0.9 % IV SOLN: INTRAVENOUS | Status: DC

## 2020-02-15 MED ORDER — SODIUM CHLORIDE 0.9 % IV BOLUS
500.0000 mL | Freq: Once | INTRAVENOUS | Status: AC
  Administered 2020-02-15: 500 mL via INTRAVENOUS

## 2020-02-15 MED ORDER — ALBUTEROL SULFATE (2.5 MG/3ML) 0.083% IN NEBU
2.5000 mg | INHALATION_SOLUTION | Freq: Four times a day (QID) | RESPIRATORY_TRACT | Status: DC
  Administered 2020-02-15 – 2020-02-17 (×9): 2.5 mg via RESPIRATORY_TRACT
  Filled 2020-02-15 (×9): qty 3

## 2020-02-15 NOTE — Plan of Care (Signed)
  Problem: Respiratory: Goal: Patent airway maintenance will improve Outcome: Progressing   Problem: Respiratory: Goal: Will regain and/or maintain adequate ventilation Outcome: Progressing   Problem: Skin Integrity: Goal: Risk for impaired skin integrity will decrease Outcome: Progressing   Problem: Nutritional: Goal: Risk of aspiration will decrease Outcome: Progressing   Problem: Nutritional: Goal: Dietary intake will improve Outcome: Progressing

## 2020-02-15 NOTE — Progress Notes (Signed)
[facility] PROGRESS NOTE  Babe [place] BWG:[id number] DOB: Sep 02, 1965 DOA: 11/01/2019 PCP: Default, Provider, MD    11/16   Status: Remains inpatient appropriate because:Altered mental status, Unsafe d/c plan and Inpatient level of care appropriate due to severity of illness. Patient newly diagnosed with anoxic brain injury  Dispo: The patient is from: Home              Anticipated d/c is to: SNF              Anticipated d/c date is: > 3 days              Patient currently is medically stable to d/c.  Barriers to discharge: No SNF bed offers.  Needs trach capable facility. TOC communicating with financial counseling team who have yet to hear back from DSS regarding patient's assigned caseworker.  TOC spoke with [facility] rehab.  Patient would be an excellent candidate but they request he have active Medicaid and disability before they can accept him.  Also faxed out information to [facility] as of 1/10   Code Status: Full Family Communication: 1/06 dtr [name] updated DVT prophylaxis: Subcutaneous heparin Vaccination status: Will order first dose Pfizer COVID-vaccine on 1/10; next week we will order pneumococcal and influenza vaccinations  Foley catheter:  No  HPI: 55 year old male with HIV, chronic alcohol abuse who was presented to the emergency department after being found unresponsive outside a convinient store.  EMS found him pulseless in PEA, unknown downtime, successfully resuscitated and brought to the emergency department.  UDS positive for THC and benzodiazepines.  CT head unremarkable.  Failed extubation trials due to severe anoxic brain injury and had tracheostomy placed.  PEG placed on 10/15.  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  This has led to significant pain as well.  [name] was consulted and recommendations/input/orders highly appreciated.  She may also benefit  from Botox injections to both hips to aid in treatment of spasticity.   Significant Hospital Events   9/29 Admit post PEA arrest. UDS positive for THC, benzo's. ETOH 177 10/05 EEG ongoing. Versed restarted overnight due to agitation. Nicardipine increased.  10/06 Developed tachypnea with WUA, no follow commands off sedation  10/07 Vomiting, TF held / restarted with recurrent vomiting 10/16 tracheostomy collar for 9 hours 10/18->10/19 off vent all night  10/20> TC 11/15 > downsized to #4 Shiley flex CFS  Subjective: Awake.  Alert and moving extremities spontaneously and repeatedly saying hi.  Spontaneous productive cough.  Objective: Vitals:   02/15/20 0552 02/15/20 0800  BP:  (!) 130/96  Pulse:  (!) 106  Resp:  18  Temp: (!) 100.5 F (38.1 C) (!) 100.8 F (38.2 C)  SpO2:      Intake/Output Summary (Last 24 hours) at 02/15/2020 1101 Last data filed at 02/15/2020 1054 Gross per 24 hour  Intake -  Output 1600 ml  Net -1600 ml   Filed Weights   02/09/20 0518 02/10/20 0500 02/13/20 0750  Weight: 74 kg 74.5 kg 70.5 kg    Exam: General: Awake, somewhat restless.  Noted has just had bowel movement.  T-max 101.8 F Pulmonary: Anterior lung sounds coarse with some expiratory rhonchi.  Patient with congested sounding cough but no significant secretions from trach.  Stable on 21% FiO2 via trach collar Cardiac: Regular pulse, normal heart sounds S1-S2 with no murmurs or rubs, no peripheral edema Abdomen: PEG site unremarkable, abdomen soft and nontender to  palpation with normoactive bowel sounds, LBM 1/12 Neurological: Awake interactive today. Continues with hypertonicity worse in the lower extremities involving the hip flexors and hamstrings noting has received Botox injection on 1/6. Of note PT/OT able to extend right leg from knee 90 degrees on 1/11. Has limited use of upper extremities with action primarily initiated from the shoulder girth.  Assessment/Plan: Acute  problems: Anoxic brain injury 2/2 PEA arrest/pain syndrome secondary to hypertonicity/spastic tetraplegia:  -Presumed 2/2 combination alcohol and BZD overdose with UDS positive for THC and BZDs -Significant brain injury with underlying spastic tetraplegia long-term SNF placement recommended-continue PT/OT/SLP -Continue baclofen, gabapentin, scheduled oxycodone and Zanaflex  -Appreciate assistance of Dr. Kathleene Hazel dose has been uptitrated to 6 mg every 6 hours -1/6 Botox injection into the quadriceps and rectus femoris as well as the biceps femoris and hamstrings, semimembranosus and semitendinosus.  Dr. Eda Keys has documented that Botox typically takes 7 to 10 days before effect is seen.  Unable to inject iliopsoas without EMG guidance. -Continue bilateral upper extremity WHO resting splints along with PROM per nursing staff every 4 hours -Continue low-dose Seroquel for brain injury sequela -Continue scheduled Tylenol for pain along with Crea muscle rub -Continue KREG rotational bed   SIRS/Fever/abnormal chest x-ray -Patient developed mild elevation in temperature overnight associated with slight increase in respiratory secretions and congested sounding cough/abnormal pulmonary exam as well as tachycardia -UA slightly abnormal but more consistent with dehydration in conjunction with slightly elevated lactic acid of 2.2 and 2.5 with normal procalcitonin-repeat lactic acid; normal-no differential obtained but given has HIV disease this may not be unexpected -Chest x-ray with bilateral opacities concerning for evolving pneumonia -We will continue broad-spectrum antibiotics with vancomycin and despite normal procalcitonin since symptoms are early and procalcitonin can bump later.  Continues to have low-grade fevers. -Follow-up on blood cultures, urine culture.  Obtain induced sputum culture -Patient has HIV disease with a CD4 count of 124 and is on prophylactic Bactrim which will be continued.   LDH 173 making PCP less likely.   -Obtain CT of the chest  -Because of dehydration will increase frequency of free water from 200 cc every 4 hours to every 2 hours-May eventually need IV fluids   Chronic respiratory failure with inability to maintain patent airway requiring chronic tracheostomy tube/Recent Corynebacterium tracheobronchitis (resolved) -See above regarding fever work-up -Continue PMV training per SLP-#4 cuffless trach in place-trach changed out on 1/10 RT -Not a candidate for decannulation until patient can reliably follow commands, cough and clear airway and it appears current mentation will likely be patient's new baseline -continue physiotherapy with Vibra vest along with supervised PMV trials  HIV:  -CD4 count of 124 October 2021 (had been as high as 250 Dec 2020)-as of today up to 259 -Dc'd Tivicay and Descovy infavor of Biktarvy to for eventual dc to SNF; per my discussion with infectious disease physician [name] on 12/23 preferred regimen would be Descovy and Tivicay-she will contact the HIV pharmacist to attempt to obtain discount cards to make this a more affordable option.  Once this has been confirmed will transition back to Descovy and Tivicay -CD4 count on 12/23 up to 259 -ID recommends continuing prophylactic Bactrim an additional 3 months  Dysphagia 2/2 anoxic brain injury -Continue tube feeding per PEG -SLP attempted to introduce oral substances on 12/27 but patient did not accept and was pushing out with his tongue.  Given his acute UTI he may have been nauseated and this may have been reason for him  not wishing to eat.  Protein calorie malnutrition nutrition Status: Nutrition Problem: Increased nutrient needs Etiology: acute illness Signs/Symptoms: estimated needs Interventions: Tube feeding bolus doses of Osmolite, Juven twice daily, Prosource TF 45 mL 3 times daily Estimated body mass index is 25.08 kg/m as calculated from the following:   Height as  of this encounter: 5' 6"  (1.676 m).   Weight as of this encounter: 70.5 kg.   Multiple decubiti not POA Wound / Incision (Open or Dehisced) 11/17/19 Puncture Abdomen Left;Anterior;Upper G-tube insertion site  (Active)  Date First Assessed/Time First Assessed: 11/17/19 1646   Wound Type: Puncture  Location: Abdomen  Location Orientation: Left;Anterior;Upper  Wound Description (Comments): G-tube insertion site   Present on Admission: No    Assessments 11/17/2019  4:47 PM 02/12/2020  9:02 PM  Dressing Type Gauze (Comment);Tape dressing Gauze (Comment)  Dressing Changed New -  Dressing Status Clean;Dry;Intact Clean;Dry;Intact  Dressing Change Frequency PRN -  Site / Wound Assessment Clean;Dry;Pink Clean;Dry;Pink  Peri-wound Assessment Intact -  Closure None -  Drainage Amount None -  Treatment Cleansed -     No Linked orders to display      Other problems: Hypertension/grade 1 diastolic dysfunction:  -Continue amlodipine  -Echocardiogram this admission with preserved LVEF with evidence of grade   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   -Continue Keppra; levetiracetam level 35.9 on 11/12 -no further seizure activity since transitioned out of ICU setting therefore will discontinue seizure precaution  Inguinal hernias -Evaluated by general surgery this admission. Documented as chronically incarcerated. Patient has been clinically stable and tolerating tube feedings and having bowel movements so unless patient obstructed would not be considered a candidate for repair. Even if obstructed consulting surgeon was not sure he would be a candidate for hernia repair regardless . Enterococcus faecalis UTI -Urine culture positive for pansensitive Enterococcus-empiric Levaquin discontinued on 12/30 in favor of Augmentin and completed 5 days of therapy  Goals of care:  -Palliative care was involved during this hospitalization.   -Goals of care were discussed. remains full  code.  -Palliative care recommended outpatient follow-up with palliative providers. -May need to revisit end-of-life goals of care since patient making poor progress and currently having significant pain related to hypertonicity from brain injury   Data Reviewed: Basic Metabolic Panel: Recent Labs  Lab 02/10/20 0512 02/15/20 0433  NA 143  --   K 3.8  --   CL 105  --   CO2 27  --   GLUCOSE 106*  --   BUN 19  --   CREATININE 0.76 0.77  CALCIUM 10.1  --    Liver Function Tests: No results for input(s): AST, ALT, ALKPHOS, BILITOT, PROT, ALBUMIN in the last 168 hours. No results for input(s): LIPASE, AMYLASE in the last 168 hours. No results for input(s): AMMONIA in the last 168 hours. CBC: Recent Labs  Lab 02/10/20 0512 02/14/20 2242  WBC 4.0 5.8  HGB 14.3 14.2  HCT 45.6 43.0  MCV 96.4 95.1  PLT 179 174   Cardiac Enzymes: No results for input(s): CKTOTAL, CKMB, CKMBINDEX, TROPONINI in the last 168 hours. BNP (last 3 results) Recent Labs    02/15/20 0433  BNP 3.9    ProBNP (last 3 results) No results for input(s): PROBNP in the last 8760 hours.  CBG: Recent Labs  Lab 02/14/20 1613 02/14/20 1927 02/14/20 2307 02/15/20 0326 02/15/20 0728  GLUCAP 105* 200* 130* 118* 121*    No results found for this or  any previous visit (from the past 240 hour(s)).   Studies: DG CHEST PORT 1 VIEW  Result Date: 02/14/2020 CLINICAL DATA:  Fever EXAM: PORTABLE CHEST 1 VIEW COMPARISON:  01/26/2020 FINDINGS: Scattered patchy opacities in the lungs. Heart is normal size. No effusions. No acute bony abnormality. Tracheostomy remains in place, unchanged. IMPRESSION: Patchy bilateral vague airspace opacities. Findings could reflect early pneumonia. Electronically Signed   By: Rolm Baptise M.D.   On: 02/14/2020 22:41    Scheduled Meds: . acetaminophen (TYLENOL) oral liquid 160 mg/5 mL  650 mg Per Tube Q6H  . albuterol  2.5 mg Nebulization Q6H  . amLODipine  10 mg Per Tube Daily  .  baclofen  30 mg Per Tube TID  . chlorhexidine gluconate (MEDLINE KIT)  15 mL Mouth Rinse BID  . Darunavir-Cobicisctat-Emtricitabine-Tenofovir Alafenamide  1 tablet Oral Q breakfast  . famotidine  10.4 mg Per Tube BID  . feeding supplement (OSMOLITE 1.5 CAL)  237 mL Per Tube Q24H  . feeding supplement (OSMOLITE 1.5 CAL)  474 mL Per Tube TID  . feeding supplement (PROSource TF)  45 mL Per Tube TID  . fiber  1 packet Per Tube BID  . folic acid  1 mg Per Tube Daily  . free water  200 mL Per Tube Q2H  . gabapentin  200 mg Per Tube Q8H  . heparin injection (subcutaneous)  5,000 Units Subcutaneous Q8H  . levETIRAcetam  1,500 mg Per Tube BID  . Muscle Rub   Topical QID  . nystatin  5 mL Oral QID  . oxyCODONE  5 mg Per Tube Q8H  . QUEtiapine  12.5 mg Per Tube QHS  . scopolamine  1 patch Transdermal Q72H  . sulfamethoxazole-trimethoprim  1 tablet Per Tube Once per day on Mon Wed Fri  . thiamine  100 mg Per Tube Daily  . tiZANidine  6 mg Per Tube Q6H   Continuous Infusions: . piperacillin-tazobactam (ZOSYN)  IV 3.375 g (02/15/20 0909)  . vancomycin      Active Problems:   Cardiac arrest [facility])   Community acquired pneumonia of left upper lobe of lung   Anoxic brain injury ([place])   Alcohol abuse   Acute respiratory failure with hypoxia (HCC)   Vomiting   Pressure injury of skin   Small bowel obstruction (HCC)   Palliative care encounter   Bowel obstruction (HCC)   Chronic respiratory failure (HCC)   Diastolic dysfunction   Tracheostomy dependent (HCC)   Dysphagia   Protein-calorie malnutrition (HCC)   Seizures (HCC)   Bilateral recurrent inguinal hernia   Muscle spasticity   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HCC)   Tracheobronchitis   Sequela of Corynebacterium infection   Abnormal urinalysis   Urinary tract infection due to Enterococcus   Consultants:  PCCM  Palliative medicine  General surgery  Neurology  Interventional  radiology  Procedures:  EEG  Echocardiogram  Tracheostomy  Antibiotics: Anti-infectives (From admission, onward)   Start     Dose/Rate Route Frequency Ordered Stop   02/15/20 1000  vancomycin (VANCOCIN) IVPB 1000 mg/200 mL premix        1,000 mg 200 mL/hr over 60 Minutes Intravenous Every 12 hours 02/14/20 2226     02/14/20 2245  piperacillin-tazobactam (ZOSYN) IVPB 3.375 g        3.375 g 12.5 mL/hr over 240 Minutes Intravenous Every 8 hours 02/14/20 2226     02/14/20 2245  vancomycin (VANCOREADY) IVPB 1500 mg/300 mL  1,500 mg 150 mL/hr over 120 Minutes Intravenous  Once 02/14/20 2226 02/15/20 0739   02/01/20 2200  amoxicillin-clavulanate (AUGMENTIN) 250-62.5 MG/5ML suspension 500 mg        500 mg Per Tube Every 8 hours 02/01/20 1343 02/06/20 1333   01/31/20 1015  levofloxacin (LEVAQUIN) 25 MG/ML solution 500 mg  Status:  Discontinued        500 mg Per Tube Daily 01/31/20 0919 02/01/20 1343   01/31/20 1000  cefTRIAXone (ROCEPHIN) 1 g in sodium chloride 0.9 % 100 mL IVPB  Status:  Discontinued        1 g 200 mL/hr over 30 Minutes Intravenous Every 24 hours 01/31/20 0905 01/31/20 0919   01/19/20 2200  linezolid (ZYVOX) tablet 600 mg        600 mg Per Tube Every 12 hours 01/19/20 1534 01/25/20 2019   01/19/20 1100  levofloxacin (LEVAQUIN) tablet 500 mg  Status:  Discontinued        500 mg Per Tube Daily 01/19/20 1004 01/19/20 1534   12/19/19 0930  Darunavir-Cobicisctat-Emtricitabine-Tenofovir Alafenamide (SYMTUZA) 800-150-200-10 MG TABS 1 tablet        1 tablet Oral Daily with breakfast 12/19/19 0840     12/13/19 1000  bictegravir-emtricitabine-tenofovir AF (BIKTARVY) 50-200-25 MG per tablet 1 tablet  Status:  Discontinued        1 tablet Oral Daily 12/12/19 1413 12/19/19 0840   11/17/19 1548  ceFAZolin (ANCEF) 2-4 GM/100ML-% IVPB       Note to Pharmacy: Domenick Bookbinder   : cabinet override      11/17/19 1548 11/18/19 0359   11/16/19 1515  ceFAZolin (ANCEF) IVPB 2g/100 mL  premix        2 g 200 mL/hr over 30 Minutes Intravenous To Radiology 11/16/19 1511 11/17/19 1625   11/15/19 0900  sulfamethoxazole-trimethoprim (BACTRIM DS) 800-160 MG per tablet 1 tablet        1 tablet Per Tube Once per day on Mon Wed Fri 11/13/19 1906     11/13/19 1615  dolutegravir (TIVICAY) tablet 50 mg  Status:  Discontinued        50 mg Oral Daily 11/13/19 1521 12/12/19 1413   11/13/19 1615  emtricitabine-tenofovir AF (DESCOVY) 200-25 MG per tablet 1 tablet  Status:  Discontinued        1 tablet Per Tube Daily 11/13/19 1521 12/12/19 1413   11/13/19 1530  sulfamethoxazole-trimethoprim (BACTRIM DS) 800-160 MG per tablet 1 tablet  Status:  Discontinued        1 tablet Oral Once per day on Mon Wed Fri 11/13/19 1441 11/13/19 1906   11/13/19 1500  bictegravir-emtricitabine-tenofovir AF (BIKTARVY) 50-200-25 MG per tablet 1 tablet  Status:  Discontinued        1 tablet Oral Daily 11/13/19 1403 11/13/19 1521   11/10/19 1000  erythromycin 250 mg in sodium chloride 0.9 % 100 mL IVPB        250 mg 100 mL/hr over 60 Minutes Intravenous Every 8 hours 11/10/19 0907 11/11/19 2000   11/07/19 1200  ceFEPIme (MAXIPIME) 2 g in sodium chloride 0.9 % 100 mL IVPB        2 g 200 mL/hr over 30 Minutes Intravenous Every 8 hours 11/07/19 1013 11/13/19 2127   11/03/19 1200  cefTRIAXone (ROCEPHIN) 2 g in sodium chloride 0.9 % 100 mL IVPB        2 g 200 mL/hr over 30 Minutes Intravenous Every 24 hours 11/03/19 0922 11/05/19 1427   11/02/19 0800  vancomycin (VANCOREADY) IVPB 750 mg/150 mL  Status:  Discontinued        750 mg 150 mL/hr over 60 Minutes Intravenous Every 12 hours 11/01/19 1935 11/02/19 1105   11/02/19 0600  piperacillin-tazobactam (ZOSYN) IVPB 3.375 g  Status:  Discontinued        3.375 g 12.5 mL/hr over 240 Minutes Intravenous Every 8 hours 11/02/19 0311 11/03/19 0920   11/01/19 2200  ceFEPIme (MAXIPIME) 2 g in sodium chloride 0.9 % 100 mL IVPB  Status:  Discontinued        2 g 200 mL/hr over 30  Minutes Intravenous Every 12 hours 11/01/19 2143 11/02/19 0311   11/01/19 1915  vancomycin (VANCOREADY) IVPB 1500 mg/300 mL        1,500 mg 150 mL/hr over 120 Minutes Intravenous  Once 11/01/19 1909 11/01/19 2147   11/01/19 1845  cefTRIAXone (ROCEPHIN) 1 g in sodium chloride 0.9 % 100 mL IVPB        1 g 200 mL/hr over 30 Minutes Intravenous  Once 11/01/19 1842 11/01/19 2051   11/01/19 1845  azithromycin (ZITHROMAX) 500 mg in sodium chloride 0.9 % 250 mL IVPB        500 mg 250 mL/hr over 60 Minutes Intravenous  Once 11/01/19 1842 11/01/19 2051       Time spent: 30 minutes    Erin Hearing ANP  [facility]  7 am-330 pm/M-F for direct patient care and secure chat Please refer to Amion for contact information 02/15/2020, 11:01 AM  LOS: 106 days

## 2020-02-16 LAB — RESP PANEL BY RT-PCR (RSV, FLU A&B, COVID)  RVPGX2
Influenza A by PCR: NEGATIVE
Influenza B by PCR: NEGATIVE
Resp Syncytial Virus by PCR: NEGATIVE
SARS Coronavirus 2 by RT PCR: NEGATIVE

## 2020-02-16 LAB — COMPREHENSIVE METABOLIC PANEL
ALT: 17 U/L (ref 0–44)
AST: 24 U/L (ref 15–41)
Albumin: 3.1 g/dL — ABNORMAL LOW (ref 3.5–5.0)
Alkaline Phosphatase: 89 U/L (ref 38–126)
Anion gap: 15 (ref 5–15)
BUN: 18 mg/dL (ref 6–20)
CO2: 23 mmol/L (ref 22–32)
Calcium: 10.2 mg/dL (ref 8.9–10.3)
Chloride: 106 mmol/L (ref 98–111)
Creatinine, Ser: 0.64 mg/dL (ref 0.61–1.24)
GFR, Estimated: >60 mL/min (ref >60)
Glucose, Bld: 109 mg/dL — ABNORMAL HIGH (ref 70–99)
Potassium: 3.7 mmol/L (ref 3.5–5.1)
Sodium: 144 mmol/L (ref 135–145)
Total Bilirubin: 0.5 mg/dL (ref 0.3–1.2)
Total Protein: 7.1 g/dL (ref 6.5–8.1)

## 2020-02-16 LAB — GLUCOSE, CAPILLARY
Glucose-Capillary: 108 mg/dL — ABNORMAL HIGH (ref 70–99)
Glucose-Capillary: 109 mg/dL — ABNORMAL HIGH (ref 70–99)
Glucose-Capillary: 110 mg/dL — ABNORMAL HIGH (ref 70–99)
Glucose-Capillary: 120 mg/dL — ABNORMAL HIGH (ref 70–99)
Glucose-Capillary: 121 mg/dL — ABNORMAL HIGH (ref 70–99)
Glucose-Capillary: 122 mg/dL — ABNORMAL HIGH (ref 70–99)
Glucose-Capillary: 85 mg/dL (ref 70–99)

## 2020-02-16 LAB — MRSA PCR SCREENING: MRSA by PCR: NEGATIVE

## 2020-02-16 LAB — LACTIC ACID, PLASMA: Lactic Acid, Venous: 2.3 mmol/L (ref 0.5–1.9)

## 2020-02-16 LAB — CBC
HCT: 47.8 % (ref 39.0–52.0)
Hemoglobin: 15.9 g/dL (ref 13.0–17.0)
MCH: 31.5 pg (ref 26.0–34.0)
MCHC: 33.3 g/dL (ref 30.0–36.0)
MCV: 94.8 fL (ref 80.0–100.0)
Platelets: 131 10*3/uL — ABNORMAL LOW (ref 150–400)
RBC: 5.04 MIL/uL (ref 4.22–5.81)
RDW: 13.2 % (ref 11.5–15.5)
WBC: 4.3 10*3/uL (ref 4.0–10.5)
nRBC: 0 % (ref 0.0–0.2)

## 2020-02-16 LAB — PROCALCITONIN: Procalcitonin: <0.1 ng/mL

## 2020-02-16 NOTE — Progress Notes (Signed)
Physical Therapy Treatment Patient Details Name: Ryan Orozco MRN: [id number] DOB: Jan 20, 1966 Today's Date: 02/16/2020    History of Present Illness 55 year old male found down outside convenience store 9/27. Pt with PEA and hypoxic brain injury. Trach 10/13, PEG 10/15. PMHx: HIV AIDS and alcohol abuse.  CIR physician consulted for spacticity management and s/p botox injections 02/08/20. COVID (-) 02/15/19.    PT Comments    Pt very alert and restless today, making loud grunts/groans, noises that sounded like words "yeah, hey, help" throughout session.  With his increased alertness seems to come increased flexion tone at his trunk, R UE and R LE that I could not get to relax as much as in past sessions with him.  He remains total assist, follow a couple of commands to "sit up" while in the recliner chair.  He bobbed his head to music today, and seemingly greeted staff who approached and said hello in the hallway.  PT will continue to follow acutely for safe mobility progression.   Follow Up Recommendations  SNF     Equipment Recommendations  Hospital bed;Wheelchair (measurements PT);Other (comment);Wheelchair cushion (measurements PT) (hoyer lift, air mattress)    Recommendations for Other Services       Precautions / Restrictions Precautions Precautions: Fall Precaution Comments: trach, PEG, anoxic, flexor tone R>L LE/UE Required Braces or Orthoses: Other Brace Other Brace: bilateral resting hand splints Restrictions Other Position/Activity Restrictions: skin integrity    Mobility  Bed Mobility Overal bed mobility: Needs Assistance Bed Mobility: Rolling Rolling: Total assist;+2 for physical assistance         General bed mobility comments: Two person total assist to roll.  He seems to have increased tonal tendancies today with increased arousal /alertness.  When asked to roll or rolling initiated he would flex his trunk up lifting his head and upper shoulders up, but  unable to reach with hands and then was almost resistive to the roll in this tone triggered position.  Transfers Overall transfer level: Needs assistance               General transfer comment: Maxi move OOB to recliner chair for hopes of relaxing his fetal tone (as the chair has in the past, however, today stayed very flexed especially R hip and knee and leaning to the R in the recliner chair where as normally he would, once positioned in 90/90 sitting, relax his R leg down to nearly neutral, that did not happen today.  Ambulation/Gait                 Stairs             Wheelchair Mobility    Modified Rankin (Stroke Patients Only)       Balance                                            Cognition Arousal/Alertness: Awake/alert Behavior During Therapy: Restless Overall Cognitive Status: Impaired/Different from baseline Area of Impairment: Following commands;Problem solving                   Current Attention Level: Focused         Problem Solving: Slow processing;Decreased initiation;Difficulty sequencing;Requires verbal cues;Requires tactile cues General Comments: Very alert and restless today, vocal, but not sure what exactly he is saying "hey, yeah, help" sounding one word exclaimations.  Able to follow  some 2-3 commands to the best of his physical abilities.      Exercises Other Exercises Other Exercises: increased tone R side arm and leg throughout.  seemed to be increased today along with his alertness.  Unable to attempt ROM to R LE as each touch even gentle triggered increased hip and knee flexion.    General Comments General comments (skin integrity, edema, etc.): Pt positioned in chair for 20 mins, using music therapy to help brighten his mood, pt bobbing head to music at times, circled the unit in the recliner pt saying "hey" to staff as they approached.      Pertinent Vitals/Pain Pain Assessment: Faces Faces Pain  Scale: Hurts even more Pain Location: generalized and seems to be tone related Pain Descriptors / Indicators: Grimacing;Guarding Pain Intervention(s): Limited activity within patient's tolerance;Monitored during session;Repositioned    Home Living                      Prior Function            PT Goals (current goals can now be found in the care plan section) Progress towards PT goals: Not progressing toward goals - comment (increased tone today)    Frequency    Min 2X/week      PT Plan Current plan remains appropriate    Co-evaluation              AM-PAC PT "6 Clicks" Mobility   Outcome Measure  Help needed turning from your back to your side while in a flat bed without using bedrails?: Total Help needed moving from lying on your back to sitting on the side of a flat bed without using bedrails?: Total Help needed moving to and from a bed to a chair (including a wheelchair)?: Total Help needed standing up from a chair using your arms (e.g., wheelchair or bedside chair)?: Total Help needed to walk in hospital room?: Total Help needed climbing 3-5 steps with a railing? : Total 6 Click Score: 6    End of Session   Activity Tolerance: Patient limited by pain Patient left: in bed;with call bell/phone within reach;with bed alarm set   PT Visit Diagnosis: Other abnormalities of gait and mobility (R26.89);Muscle weakness (generalized) (M62.81)     Time: 1610-9604 PT Time Calculation (min) (ACUTE ONLY): 47 min  Charges:  $Therapeutic Activity: 38-52 mins                     Corinna Capra, PT, DPT  Acute Rehabilitation [phone] pager [phone]) [phone] office

## 2020-02-16 NOTE — Progress Notes (Addendum)
[facility] PROGRESS NOTE  Ryan Orozco QQV:[id number] DOB: Jul 29, 1965 DOA: 11/01/2019 PCP: Default, Provider, MD    11/16   Status: Remains inpatient appropriate because:Altered mental status, Unsafe d/c plan and Inpatient level of care appropriate due to severity of illness. Patient newly diagnosed with anoxic brain injury  Dispo: The patient is from: Home              Anticipated d/c is to: SNF              Anticipated d/c date is: > 3 days              Patient currently is medically stable to d/c.  Barriers to discharge: No SNF bed offers.  Needs trach capable facility. TOC communicating with financial counseling team who have yet to hear back from DSS regarding patient's assigned caseworker.  TOC spoke with [facility] rehab.  Patient would be an excellent candidate but they request he have active Medicaid and disability before they can accept him.  Also faxed out information to [facility] as of 1/10   Code Status: Full Family Communication: 1/14 dtr [name] updated DVT prophylaxis: Subcutaneous heparin Vaccination status: Will order first dose Pfizer COVID-vaccine on 1/10; next week we will order pneumococcal and influenza vaccinations  Foley catheter:  No  HPI: 55 year old male with HIV, chronic alcohol abuse who was presented to the emergency department after being found unresponsive outside a convinient store.  EMS found him pulseless in PEA, unknown downtime, successfully resuscitated and brought to the emergency department.  UDS positive for THC and benzodiazepines.  CT head unremarkable.  Failed extubation trials due to severe anoxic brain injury and had tracheostomy placed.  PEG placed on 10/15.  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  This has led to significant pain as well.  [name] was consulted and recommendations/input/orders highly appreciated.  She may also benefit  from Botox injections to both hips to aid in treatment of spasticity.   Significant Hospital Events   9/29 Admit post PEA arrest. UDS positive for THC, benzo's. ETOH 177 10/05 EEG ongoing. Versed restarted overnight due to agitation. Nicardipine increased.  10/06 Developed tachypnea with WUA, no follow commands off sedation  10/07 Vomiting, TF held / restarted with recurrent vomiting 10/16 tracheostomy collar for 9 hours 10/18->10/19 off vent all night  10/20> TC 11/15 > downsized to #4 Shiley flex CFS  Subjective: Awake.  There is to be somewhat uncomfortable.  Not as congested as yesterday.  Objective: Vitals:   02/16/20 0343 02/16/20 0536  BP:  125/86  Pulse: 91 87  Resp: 19 20  Temp:  98.3 F (36.8 C)  SpO2: 100% 100%    Intake/Output Summary (Last 24 hours) at 02/16/2020 1049 Last data filed at 02/15/2020 1549 Gross per 24 hour  Intake 294.02 ml  Output 1000 ml  Net -705.98 ml   Filed Weights   02/09/20 0518 02/10/20 0500 02/13/20 0750  Weight: 74 kg 74.5 kg 70.5 kg    Exam: General: Awake, somewhat restless. Pulmonary: Remains stable on trach collar with FiO2 21%.  Lung sounds coarse but not as congested as yesterday.  No increased work of breathing. Cardiac: Heart sounds S1-S2, pulses regular and nontachycardic.  Extremities warm to touch.  IV fluids at 75 cc/h Abdomen: PEG site unremarkable, abdomen soft and nontender to palpation with normoactive bowel sounds, LBM 1/13 Neurological: Awake interactive today. Continues with hypertonicity worse in the lower  extremities involving the hip flexors and hamstrings noting has received Botox injection on 1/6. Of note PT/OT able to extend right leg from knee 90 degrees on 1/11. Has limited use of upper extremities with action primarily initiated from the shoulder girth.  Assessment/Plan: Acute problems: Anoxic brain injury 2/2 PEA arrest/pain syndrome secondary to hypertonicity/spastic tetraplegia:  -Presumed 2/2  combination alcohol and BZD overdose with UDS positive for THC and BZDs -Significant brain injury with underlying spastic tetraplegia long-term SNF placement recommended-continue PT/OT/SLP -Continue baclofen, gabapentin, scheduled oxycodone and Zanaflex  -Appreciate assistance of Dr. Kathleene Hazel dose has been uptitrated to 6 mg every 6 hours -1/6 Botox injection into the quadriceps and rectus femoris as well as the biceps femoris and hamstrings, semimembranosus and semitendinosus.  Dr. Eda Keys has documented that Botox typically takes 7 to 10 days before effect is seen.  Unable to inject iliopsoas without EMG guidance. -Continue bilateral upper extremity WHO resting splints along with PROM per nursing staff every 4 hours -Continue low-dose Seroquel for brain injury sequela -Continue scheduled Tylenol for pain along with Crea muscle rub -Continue KREG rotational bed   SIRS/Fever/abnormal chest x-ray -Patient developed mild elevation in temperature overnight to 1/13 associated with slight increase in respiratory secretions and congested sounding cough/abnormal pulmonary exam as well as tachycardia./14 respiratory status much improved and no further fevers. -As a precaution we will obtain respiratory viral panel to include screening for COVID, influenza and RSV.  Maintain appropriate airborne precautions until test has resulted -UA slightly abnormal but more consistent with dehydration in conjunction with slightly elevated lactic acid of 2.2 and 2.5 up lactic acid 2.1 and 2.3.  This could solely be related to dehydration but could be related to other inflammatory processes.  Have initiated normal saline at 75 cc/h. -Follow-up on blood cultures, urine culture. NGTD  -Patient has HIV disease with a CD4 count of 124 and is on prophylactic Bactrim. LDH 173 making PCP less likely.   -X-ray with bilateral opacities concerning for early pneumonia but CT of the chest without pneumonia so therefore will  discontinue broad-spectrum antibiotics as of 1/14 noting procalcitonin was also within normal limits -Because of dehydration increased frequency of free water from 200 cc every 4 hours to every 2 hours on 1/13   Chronic respiratory failure with inability to maintain patent airway requiring chronic tracheostomy tube/Recent Corynebacterium tracheobronchitis (resolved) -See above regarding fever work-up -Continue PMV training per SLP-#4 cuffless trach in place-trach changed out on 1/10 RT -Not a candidate for decannulation until patient can reliably follow commands, cough and clear airway and it appears current mentation will likely be patient's new baseline -continue physiotherapy with Vibra vest along with supervised PMV trials  HIV:  -CD4 count of 124 October 2021 (had been as high as 250 Dec 2020)-as of today up to 259 -Emesis recommends repeating viral load which has been ordered on 1/14 -Dc'd Tivicay and Descovy infavor of Biktarvy to for eventual dc to SNF; per my discussion with infectious disease physician [name] on 12/23 preferred regimen would be Descovy and Tivicay-she will contact the HIV pharmacist to attempt to obtain discount cards to make this a more affordable option.  Once this has been confirmed will transition back to Descovy and Tivicay -CD4 count on 12/23 up to 259 -ID recommends continuing prophylactic Bactrim an additional 3 months  Dysphagia 2/2 anoxic brain injury -Continue tube feeding per PEG -SLP attempted to introduce oral substances on 12/27 but patient did not accept and was pushing out with  his tongue.  Given his acute UTI he may have been nauseated and this may have been reason for him not wishing to eat.  Protein calorie malnutrition nutrition Status: Nutrition Problem: Increased nutrient needs Etiology: acute illness Signs/Symptoms: estimated needs Interventions: Tube feeding bolus doses of Osmolite, Juven twice daily, Prosource TF 45 mL 3 times  daily Estimated body mass index is 25.08 kg/m as calculated from the following:   Height as of this encounter: 5' 6"  (1.676 m).   Weight as of this encounter: 70.5 kg.   Multiple decubiti not POA Wound / Incision (Open or Dehisced) 11/17/19 Puncture Abdomen Left;Anterior;Upper G-tube insertion site  (Active)  Date First Assessed/Time First Assessed: 11/17/19 1646   Wound Type: Puncture  Location: Abdomen  Location Orientation: Left;Anterior;Upper  Wound Description (Comments): G-tube insertion site   Present on Admission: No    Assessments 11/17/2019  4:47 PM 02/12/2020  9:02 PM  Dressing Type Gauze (Comment);Tape dressing Gauze (Comment)  Dressing Changed New -  Dressing Status Clean;Dry;Intact Clean;Dry;Intact  Dressing Change Frequency PRN -  Site / Wound Assessment Clean;Dry;Pink Clean;Dry;Pink  Peri-wound Assessment Intact -  Closure None -  Drainage Amount None -  Treatment Cleansed -     No Linked orders to display      Other problems: Hypertension/grade 1 diastolic dysfunction:  -Continue amlodipine  -Echocardiogram this admission with preserved LVEF with evidence of grade   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   -Continue Keppra; levetiracetam level 35.9 on 11/12 -no further seizure activity since transitioned out of ICU setting therefore will discontinue seizure precaution  Inguinal hernias -Evaluated by general surgery this admission. Documented as chronically incarcerated. Patient has been clinically stable and tolerating tube feedings and having bowel movements so unless patient obstructed would not be considered a candidate for repair. Even if obstructed consulting surgeon was not sure he would be a candidate for hernia repair regardless . Enterococcus faecalis UTI -Urine culture positive for pansensitive Enterococcus-empiric Levaquin discontinued on 12/30 in favor of Augmentin and completed 5 days of therapy  Goals of care:  -Palliative  care was involved during this hospitalization.   -Goals of care were discussed. remains full code.  -Palliative care recommended outpatient follow-up with palliative providers. -May need to revisit end-of-life goals of care since patient making poor progress and currently having significant pain related to hypertonicity from brain injury   Data Reviewed: Basic Metabolic Panel: Recent Labs  Lab 02/10/20 0512 02/15/20 0433 02/16/20 0537  NA 143  --  144  K 3.8  --  3.7  CL 105  --  106  CO2 27  --  23  GLUCOSE 106*  --  109*  BUN 19  --  18  CREATININE 0.76 0.77 0.64  CALCIUM 10.1  --  10.2   Liver Function Tests: Recent Labs  Lab 02/16/20 0537  AST 24  ALT 17  ALKPHOS 89  BILITOT 0.5  PROT 7.1  ALBUMIN 3.1*   No results for input(s): LIPASE, AMYLASE in the last 168 hours. No results for input(s): AMMONIA in the last 168 hours. CBC: Recent Labs  Lab 02/10/20 0512 02/14/20 2242 02/16/20 0537  WBC 4.0 5.8 4.3  HGB 14.3 14.2 15.9  HCT 45.6 43.0 47.8  MCV 96.4 95.1 94.8  PLT 179 174 131*   Cardiac Enzymes: No results for input(s): CKTOTAL, CKMB, CKMBINDEX, TROPONINI in the last 168 hours. BNP (last 3 results) Recent Labs    02/15/20 0433  BNP 3.9  ProBNP (last 3 results) No results for input(s): PROBNP in the last 8760 hours.  CBG: Recent Labs  Lab 02/15/20 1614 02/15/20 2022 02/16/20 0014 02/16/20 0419 02/16/20 0732  GLUCAP 94 154* 122* 108* 120*    Recent Results (from the past 240 hour(s))  Culture, Urine     Status: None (Preliminary result)   Collection Time: 02/14/20  4:38 AM   Specimen: Urine, Random  Result Value Ref Range Status   Specimen Description URINE, RANDOM  Final   Special Requests NONE  Final   Culture   Final    CULTURE REINCUBATED FOR BETTER GROWTH Performed at [facility], [place] [address] [place], [place] [address]    Report Status PENDING  Incomplete  Culture, blood (routine x 2)     Status: None (Preliminary  result)   Collection Time: 02/14/20 10:42 PM   Specimen: BLOOD RIGHT ARM  Result Value Ref Range Status   Specimen Description BLOOD RIGHT ARM  Final   Special Requests   Final    BOTTLES DRAWN AEROBIC AND ANAEROBIC Blood Culture adequate volume   Culture   Final    NO GROWTH 1 DAY Performed at [facility], [place] [address], [place], [place] [address]    Report Status PENDING  Incomplete  Culture, blood (routine x 2)     Status: None (Preliminary result)   Collection Time: 02/14/20 10:42 PM   Specimen: BLOOD RIGHT HAND  Result Value Ref Range Status   Specimen Description BLOOD RIGHT HAND  Final   Special Requests   Final    BOTTLES DRAWN AEROBIC AND ANAEROBIC Blood Culture adequate volume   Culture   Final    NO GROWTH 1 DAY Performed at [facility], [place] [address] [place], [place] [address]    Report Status PENDING  Incomplete     Studies: CT CHEST WO CONTRAST  Result Date: 02/15/2020 CLINICAL DATA:  Pneumonia HIV positive. EXAM: CT CHEST WITHOUT CONTRAST TECHNIQUE: Multidetector CT imaging of the chest was performed following the standard protocol without IV contrast. COMPARISON:  Chest radiograph 02/14/2020 FINDINGS: Cardiovascular: Coronary artery calcification and aortic atherosclerotic calcification. Mediastinum/Nodes: Enlarged paratracheal lymph node measures 14 mm. Subcarinal node measures 12 mm. Tracheostomy tube in good position.  Esophagus normal Lungs/Pleura: No airspace disease to suggest pneumonia. No pulmonary edema. No ground-glass opacities. Upper Abdomen: Limited view of the liver, kidneys, pancreas are unremarkable. Normal adrenal glands. Musculoskeletal: No aggressive osseous lesion. IMPRESSION: 1. No evidence of pneumonia. 2. Mild mediastinal adenopathy may relate to HIV adenopathy 3. Coronary artery calcification and Aortic Atherosclerosis (ICD10-I70.0). Electronically Signed   By: Suzy Bouchard M.D.   On: 02/15/2020 14:55   DG CHEST PORT 1  VIEW  Result Date: 02/14/2020 CLINICAL DATA:  Fever EXAM: PORTABLE CHEST 1 VIEW COMPARISON:  01/26/2020 FINDINGS: Scattered patchy opacities in the lungs. Heart is normal size. No effusions. No acute bony abnormality. Tracheostomy remains in place, unchanged. IMPRESSION: Patchy bilateral vague airspace opacities. Findings could reflect early pneumonia. Electronically Signed   By: Rolm Baptise M.D.   On: 02/14/2020 22:41    Scheduled Meds: . acetaminophen (TYLENOL) oral liquid 160 mg/5 mL  650 mg Per Tube Q6H  . albuterol  2.5 mg Nebulization Q6H  . amLODipine  10 mg Per Tube Daily  . baclofen  30 mg Per Tube TID  . chlorhexidine gluconate (MEDLINE KIT)  15 mL Mouth Rinse BID  . Darunavir-Cobicisctat-Emtricitabine-Tenofovir Alafenamide  1 tablet Oral Q breakfast  .  famotidine  10.4 mg Per Tube BID  . feeding supplement (OSMOLITE 1.5 CAL)  237 mL Per Tube Q24H  . feeding supplement (OSMOLITE 1.5 CAL)  474 mL Per Tube TID  . feeding supplement (PROSource TF)  45 mL Per Tube TID  . fiber  1 packet Per Tube BID  . folic acid  1 mg Per Tube Daily  . free water  200 mL Per Tube Q2H  . gabapentin  200 mg Per Tube Q8H  . heparin injection (subcutaneous)  5,000 Units Subcutaneous Q8H  . levETIRAcetam  1,500 mg Per Tube BID  . Muscle Rub   Topical QID  . nystatin  5 mL Oral QID  . oxyCODONE  5 mg Per Tube Q8H  . QUEtiapine  12.5 mg Per Tube QHS  . scopolamine  1 patch Transdermal Q72H  . sulfamethoxazole-trimethoprim  1 tablet Per Tube Once per day on Mon Wed Fri  . thiamine  100 mg Per Tube Daily  . tiZANidine  6 mg Per Tube Q6H   Continuous Infusions: . sodium chloride 75 mL/hr at 02/15/20 2038    Active Problems:   Cardiac arrest [facility])   Community acquired pneumonia of left upper lobe of lung   Anoxic brain injury ([place])   Alcohol abuse   Acute respiratory failure with hypoxia ([facility])   Vomiting   Pressure injury of skin   Small bowel obstruction (H[facility])   Palliative care encounter    Bowel obstruction (H[facility])   Chronic respiratory failure (H[facility])   Diastolic dysfunction   Tracheostomy dependent ([place])   Dysphagia   Protein-calorie malnutrition ([facility])   Seizures ([place])   Bilateral recurrent inguinal hernia   Muscle spasticity   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HCC)   Tracheobronchitis   Sequela of Corynebacterium infection   Abnormal urinalysis   Urinary tract infection due to Enterococcus   Fever   Consultants:  PCCM  Palliative medicine  General surgery  Neurology  Interventional radiology  Procedures:  EEG  Echocardiogram  Tracheostomy  Antibiotics: Anti-infectives (From admission, onward)   Start     Dose/Rate Route Frequency Ordered Stop   02/15/20 1000  vancomycin (VANCOCIN) IVPB 1000 mg/200 mL premix  Status:  Discontinued        1,000 mg 200 mL/hr over 60 Minutes Intravenous Every 12 hours 02/14/20 2226 02/16/20 0742   02/14/20 2245  piperacillin-tazobactam (ZOSYN) IVPB 3.375 g  Status:  Discontinued        3.375 g 12.5 mL/hr over 240 Minutes Intravenous Every 8 hours 02/14/20 2226 02/16/20 0742   02/14/20 2245  vancomycin (VANCOREADY) IVPB 1500 mg/300 mL        1,500 mg 150 mL/hr over 120 Minutes Intravenous  Once 02/14/20 2226 02/15/20 0739   02/01/20 2200  amoxicillin-clavulanate (AUGMENTIN) 250-62.5 MG/5ML suspension 500 mg        500 mg Per Tube Every 8 hours 02/01/20 1343 02/06/20 1333   01/31/20 1015  levofloxacin (LEVAQUIN) 25 MG/ML solution 500 mg  Status:  Discontinued        500 mg Per Tube Daily 01/31/20 0919 02/01/20 1343   01/31/20 1000  cefTRIAXone (ROCEPHIN) 1 g in sodium chloride 0.9 % 100 mL IVPB  Status:  Discontinued        1 g 200 mL/hr over 30 Minutes Intravenous Every 24 hours 01/31/20 0905 01/31/20 0919   01/19/20 2200  linezolid (ZYVOX) tablet 600 mg        600 mg Per Tube Every 12 hours  01/19/20 1534 01/25/20 2019   01/19/20 1100  levofloxacin (LEVAQUIN) tablet 500 mg  Status:  Discontinued        500  mg Per Tube Daily 01/19/20 1004 01/19/20 1534   12/19/19 0930  Darunavir-Cobicisctat-Emtricitabine-Tenofovir Alafenamide (SYMTUZA) 800-150-200-10 MG TABS 1 tablet        1 tablet Oral Daily with breakfast 12/19/19 0840     12/13/19 1000  bictegravir-emtricitabine-tenofovir AF (BIKTARVY) 50-200-25 MG per tablet 1 tablet  Status:  Discontinued        1 tablet Oral Daily 12/12/19 1413 12/19/19 0840   11/17/19 1548  ceFAZolin (ANCEF) 2-4 GM/100ML-% IVPB       Note to Pharmacy: Domenick Bookbinder   : cabinet override      11/17/19 1548 11/18/19 0359   11/16/19 1515  ceFAZolin (ANCEF) IVPB 2g/100 mL premix        2 g 200 mL/hr over 30 Minutes Intravenous To Radiology 11/16/19 1511 11/17/19 1625   11/15/19 0900  sulfamethoxazole-trimethoprim (BACTRIM DS) 800-160 MG per tablet 1 tablet        1 tablet Per Tube Once per day on Mon Wed Fri 11/13/19 1906     11/13/19 1615  dolutegravir (TIVICAY) tablet 50 mg  Status:  Discontinued        50 mg Oral Daily 11/13/19 1521 12/12/19 1413   11/13/19 1615  emtricitabine-tenofovir AF (DESCOVY) 200-25 MG per tablet 1 tablet  Status:  Discontinued        1 tablet Per Tube Daily 11/13/19 1521 12/12/19 1413   11/13/19 1530  sulfamethoxazole-trimethoprim (BACTRIM DS) 800-160 MG per tablet 1 tablet  Status:  Discontinued        1 tablet Oral Once per day on Mon Wed Fri 11/13/19 1441 11/13/19 1906   11/13/19 1500  bictegravir-emtricitabine-tenofovir AF (BIKTARVY) 50-200-25 MG per tablet 1 tablet  Status:  Discontinued        1 tablet Oral Daily 11/13/19 1403 11/13/19 1521   11/10/19 1000  erythromycin 250 mg in sodium chloride 0.9 % 100 mL IVPB        250 mg 100 mL/hr over 60 Minutes Intravenous Every 8 hours 11/10/19 0907 11/11/19 2000   11/07/19 1200  ceFEPIme (MAXIPIME) 2 g in sodium chloride 0.9 % 100 mL IVPB        2 g 200 mL/hr over 30 Minutes Intravenous Every 8 hours 11/07/19 1013 11/13/19 2127   11/03/19 1200  cefTRIAXone (ROCEPHIN) 2 g in sodium chloride 0.9  % 100 mL IVPB        2 g 200 mL/hr over 30 Minutes Intravenous Every 24 hours 11/03/19 0922 11/05/19 1427   11/02/19 0800  vancomycin (VANCOREADY) IVPB 750 mg/150 mL  Status:  Discontinued        750 mg 150 mL/hr over 60 Minutes Intravenous Every 12 hours 11/01/19 1935 11/02/19 1105   11/02/19 0600  piperacillin-tazobactam (ZOSYN) IVPB 3.375 g  Status:  Discontinued        3.375 g 12.5 mL/hr over 240 Minutes Intravenous Every 8 hours 11/02/19 0311 11/03/19 0920   11/01/19 2200  ceFEPIme (MAXIPIME) 2 g in sodium chloride 0.9 % 100 mL IVPB  Status:  Discontinued        2 g 200 mL/hr over 30 Minutes Intravenous Every 12 hours 11/01/19 2143 11/02/19 0311   11/01/19 1915  vancomycin (VANCOREADY) IVPB 1500 mg/300 mL        1,500 mg 150 mL/hr over 120 Minutes Intravenous  Once 11/01/19 1909 11/01/19  2147   11/01/19 1845  cefTRIAXone (ROCEPHIN) 1 g in sodium chloride 0.9 % 100 mL IVPB        1 g 200 mL/hr over 30 Minutes Intravenous  Once 11/01/19 1842 11/01/19 2051   11/01/19 1845  azithromycin (ZITHROMAX) 500 mg in sodium chloride 0.9 % 250 mL IVPB        500 mg 250 mL/hr over 60 Minutes Intravenous  Once 11/01/19 1842 11/01/19 2051       Time spent: 30 minutes    Erin Hearing ANP  [facility]  7 am-330 pm/M-F for direct patient care and secure chat Please refer to [place] for contact information 02/16/2020, 10:49 AM  LOS: 107 days

## 2020-02-16 NOTE — Progress Notes (Signed)
Ok to repeat HIV VL while here since ART was changed to [facility] to be put down tube per [name], NP.  Ulyses Southward, PharmD, BCIDP, AAHIVP, CPP Infectious Disease Pharmacist 02/16/2020 9:50 AM

## 2020-02-17 LAB — GLUCOSE, CAPILLARY
Glucose-Capillary: 135 mg/dL — ABNORMAL HIGH (ref 70–99)
Glucose-Capillary: 113 mg/dL — ABNORMAL HIGH (ref 70–99)
Glucose-Capillary: 92 mg/dL (ref 70–99)
Glucose-Capillary: 86 mg/dL (ref 70–99)
Glucose-Capillary: 185 mg/dL — ABNORMAL HIGH (ref 70–99)
Glucose-Capillary: 93 mg/dL (ref 70–99)

## 2020-02-17 LAB — URINE CULTURE: Culture: >=100000 — AB

## 2020-02-17 MED ORDER — FREE WATER
200.0000 mL | Status: DC
  Administered 2020-02-17 – 2020-04-01 (×264): 200 mL

## 2020-02-17 MED ORDER — ALBUTEROL SULFATE (2.5 MG/3ML) 0.083% IN NEBU
2.5000 mg | INHALATION_SOLUTION | Freq: Three times a day (TID) | RESPIRATORY_TRACT | Status: DC
  Administered 2020-02-18 (×3): 2.5 mg via RESPIRATORY_TRACT
  Filled 2020-02-17 (×3): qty 3

## 2020-02-17 MED ORDER — NITROFURANTOIN 25 MG/5ML PO SUSP
100.0000 mg | Freq: Two times a day (BID) | ORAL | Status: AC
  Administered 2020-02-17 – 2020-02-20 (×7): 100 mg
  Filled 2020-02-17 (×7): qty 20

## 2020-02-17 NOTE — Progress Notes (Signed)
PROGRESS NOTE    Ryan Orozco  JAS:[id number] DOB: 1965/11/15 DOA: 11/01/2019 PCP: Default, Provider, MD    Brief Narrative:  55 year old gentleman with history of HIV, chronic alcohol abuse admitted to the hospital on 11/01/2019 when he was found unresponsive outside of a convenience store.  PEA arrest unknown downtime, successfully resuscitated and brought to the ER.  CT head unremarkable.  Failed extubation trials due to severe anoxic brain injury, now with PEG and trach. Remains in the hospital, unable to transfer to nursing home. Severely spastic tetraplegia. Significant timeline of events,  9/29 Admit post PEA arrest. UDS positive for THC, benzo's. ETOH 177 10/05 EEG ongoing. Versed restarted overnight due to agitation. Nicardipine increased.  10/06 Developed tachypnea with WUA, no follow commands off sedation  10/07 Vomiting, TF held / restarted with recurrent vomiting 10/16 tracheostomy collar for 9 hours 10/18->10/19 off vent all night  10/20> TC 11/15 > downsized to #4 Shiley flex CFS 02/15/2020, low-grade fever, increased respiratory secretions and congestion.  Transiently treated with broad-spectrum antibiotics, CT scan of the chest negative for pneumonia.  Antibiotic discontinued. Blood cultures negative.  Urine culture with more than 100,000 colonies of Enterococcus faecalis pansensitive, will treat with nitrofurantoin for 7 days.    Assessment & Plan:   Active Problems:   Cardiac arrest [facility])   Community acquired pneumonia of left upper lobe of lung   Anoxic brain injury ([facility])   Alcohol abuse   Acute respiratory failure with hypoxia (HCC)   Vomiting   Pressure injury of skin   Small bowel obstruction (HCC)   Palliative care encounter   Bowel obstruction (HCC)   Chronic respiratory failure (HCC)   Diastolic dysfunction   Tracheostomy dependent (HCC)   Dysphagia   Protein-calorie malnutrition (HCC)   Seizures (HCC)   Bilateral recurrent inguinal  hernia   Muscle spasticity   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HCC)   Tracheobronchitis   Sequela of Corynebacterium infection   Abnormal urinalysis   Urinary tract infection due to Enterococcus   Fever  Anoxic brain injury secondary to PEA arrest, pain syndrome secondary to hypertonicity and spastic tetraplegia: Conservative management.  Baclofen, gabapentin, oxycodone and Zanaflex. 1/6, Botox injections biceps femoris, quadriceps and rectus femoris. Low-dose Seroquel Scheduled Tylenol All supportive treatment Tracheostomy, now on trach collar SpO2: 99 % O2 Flow Rate (L/min): 5 L/min FiO2 (%): 21 % PEG tube feeding, continue 3 times a day feeding, discontinue IV fluids, decrease free water flush and recheck renal functions tomorrow morning.  SIRS/low-grade fever: 1/13, other test negative. Blood cultures negative.  COVID influenza and RSV negative. Urine culture with more than 100,000 colonies of Enterococcus faecalis.  Will treat with 7 days of nitrofurantoin.  Patient is on prophylaxis dose of Bactrim. 12/30, 80,000 colonies of Enterococcus, treated with 5 days of Augmentin.  Normal catheter.  HIV: On Symtuza and Bactrim.    DVT prophylaxis: heparin injection 5,000 Units Start: 11/16/19 2200 SCDs Start: 11/01/19 2113   Code Status: Full code Family Communication: None today. Disposition Plan: Status is: Inpatient  Remains inpatient appropriate because:Unsafe d/c plan   Dispo: The patient is from: Home              Anticipated d/c is to: SNF              Anticipated d/c date is: 2 days              Patient currently is medically stable to d/c. To transfer to a skilled level  of care whenever bed is available.      Consultants:   Multiple consults  Procedures:   Multiple as above  Antimicrobials:   Bactrim, 3 times a week  Nitrofurantoin 100 mg twice a day 1/15-----   Subjective: Patient seen and examined.  No overnight events.  He was just  able to make some noise.  Not able to interact.  Looks comfortable on trach collar.  Nursing reported too much free water flush.  Afebrile.  Objective: Vitals:   02/16/20 1224 02/16/20 1718 02/16/20 2022 02/17/20 0522  BP: 107/69  110/76 122/75  Pulse: 86 92 82 85  Resp: 20  17 16   Temp: 98.5 F (36.9 C)  97.8 F (36.6 C) 97.7 F (36.5 C)  TempSrc: Oral  Oral Oral  SpO2: 100% 100% 98% 99%  Weight:      Height:        Intake/Output Summary (Last 24 hours) at 02/17/2020 1006 Last data filed at 02/17/2020 0532 Gross per 24 hour  Intake --  Output 2450 ml  Net -2450 ml   Filed Weights   02/09/20 0518 02/10/20 0500 02/13/20 0750  Weight: 74 kg 74.5 kg 70.5 kg    Examination:  General exam: Appears calm, sick looking.  He looks slightly restless. Remains on trach collar on 21% oxygen.  Has mostly conducted airway sounds. Respiratory system: Conducted airway sounds. Cardiovascular system: S1 & S2 heard,  Gastrointestinal system: Soft.  Nontender.  Bowel sounds present.  PEG tube site clean and dry.   Central nervous system: Alert and awake.  Does not interact.  Spastic lower extremities.  Spastic upper extremities.   Data Reviewed: I have personally reviewed following labs and imaging studies  CBC: Recent Labs  Lab 02/14/20 2242 02/16/20 0537  WBC 5.8 4.3  HGB 14.2 15.9  HCT 43.0 47.8  MCV 95.1 94.8  PLT 174 428*   Basic Metabolic Panel: Recent Labs  Lab 02/15/20 0433 02/16/20 0537  NA  --  144  K  --  3.7  CL  --  106  CO2  --  23  GLUCOSE  --  109*  BUN  --  18  CREATININE 0.77 0.64  CALCIUM  --  10.2   GFR: Estimated Creatinine Clearance: 94.1 mL/min (by C-G formula based on SCr of 0.64 mg/dL). Liver Function Tests: Recent Labs  Lab 02/16/20 0537  AST 24  ALT 17  ALKPHOS 89  BILITOT 0.5  PROT 7.1  ALBUMIN 3.1*   No results for input(s): LIPASE, AMYLASE in the last 168 hours. No results for input(s): AMMONIA in the last 168 hours. Coagulation  Profile: No results for input(s): INR, PROTIME in the last 168 hours. Cardiac Enzymes: No results for input(s): CKTOTAL, CKMB, CKMBINDEX, TROPONINI in the last 168 hours. BNP (last 3 results) No results for input(s): PROBNP in the last 8760 hours. HbA1C: No results for input(s): HGBA1C in the last 72 hours. CBG: Recent Labs  Lab 02/16/20 1556 02/16/20 2024 02/16/20 2307 02/17/20 0314 02/17/20 0721  GLUCAP 85 109* 121* 135* 113*   Lipid Profile: No results for input(s): CHOL, HDL, LDLCALC, TRIG, CHOLHDL, LDLDIRECT in the last 72 hours. Thyroid Function Tests: No results for input(s): TSH, T4TOTAL, FREET4, T3FREE, THYROIDAB in the last 72 hours. Anemia Panel: No results for input(s): VITAMINB12, FOLATE, FERRITIN, TIBC, IRON, RETICCTPCT in the last 72 hours. Sepsis Labs: Recent Labs  Lab 02/14/20 2242 02/15/20 0433 02/15/20 1154 02/16/20 0733 02/16/20 0757  PROCALCITON <0.10  --   --  <  0.10  --   LATICACIDVEN 2.2* 2.5* 2.1*  --  2.3*    Recent Results (from the past 240 hour(s))  Culture, Urine     Status: Abnormal   Collection Time: 02/14/20  4:38 AM   Specimen: Urine, Random  Result Value Ref Range Status   Specimen Description URINE, RANDOM  Final   Special Requests   Final    NONE Performed at [facility], [address], [place], [place] [address]    Culture >=100,000 COLONIES/mL ENTEROCOCCUS FAECALIS (A)  Final   Report Status 02/17/2020 FINAL  Final   Organism ID, Bacteria ENTEROCOCCUS FAECALIS (A)  Final      Susceptibility   Enterococcus faecalis - MIC*    AMPICILLIN <=2 SENSITIVE Sensitive     NITROFURANTOIN <=16 SENSITIVE Sensitive     VANCOMYCIN 1 SENSITIVE Sensitive     * >=100,000 COLONIES/mL ENTEROCOCCUS FAECALIS  Culture, blood (routine x 2)     Status: None (Preliminary result)   Collection Time: 02/14/20 10:42 PM   Specimen: BLOOD RIGHT ARM  Result Value Ref Range Status   Specimen Description BLOOD RIGHT ARM  Final   Special Requests    Final    BOTTLES DRAWN AEROBIC AND ANAEROBIC Blood Culture adequate volume   Culture   Final    NO GROWTH 1 DAY Performed at [facility], [place] [address] [place], [place] [address]    Report Status PENDING  Incomplete  Culture, blood (routine x 2)     Status: None (Preliminary result)   Collection Time: 02/14/20 10:42 PM   Specimen: BLOOD RIGHT HAND  Result Value Ref Range Status   Specimen Description BLOOD RIGHT HAND  Final   Special Requests   Final    BOTTLES DRAWN AEROBIC AND ANAEROBIC Blood Culture adequate volume   Culture   Final    NO GROWTH 1 DAY Performed at [facility], [place] [address] [place], [place] [address]    Report Status PENDING  Incomplete  Resp panel by RT-PCR (RSV, Flu A&B, Covid) Nasopharyngeal Swab     Status: None   Collection Time: 02/16/20  9:39 AM   Specimen: Nasopharyngeal Swab; Nasopharyngeal(NP) swabs in vial transport medium  Result Value Ref Range Status   SARS Coronavirus 2 by RT PCR NEGATIVE NEGATIVE Final    Comment: (NOTE) SARS-CoV-2 target nucleic acids are NOT DETECTED.  The SARS-CoV-2 RNA is generally detectable in upper respiratory specimens during the acute phase of infection. The lowest concentration of SARS-CoV-2 viral copies this assay can detect is 138 copies/mL. A negative result does not preclude SARS-Cov-2 infection and should not be used as the sole basis for treatment or other patient management decisions. A negative result may occur with  improper specimen collection/handling, submission of specimen other than nasopharyngeal swab, presence of viral mutation(s) within the areas targeted by this assay, and inadequate number of viral copies(<138 copies/mL). A negative result must be combined with clinical observations, patient history, and epidemiological information. The expected result is Negative.  Fact Sheet for Patients:  EntrepreneurPulse.com.au  Fact Sheet for Healthcare Providers:   IncredibleEmployment.be  This test is no t yet approved or cleared by the [place] FDA and  has been authorized for detection and/or diagnosis of SARS-CoV-2 by FDA under an Emergency Use Authorization (EUA). This EUA will remain  in effect (meaning this test can be used) for the duration of the COVID-19 declaration under Section 564(b)(1) of the Act, 21 U.S.C.section 360bbb-3(b)(1),  unless the authorization is terminated  or revoked sooner.       Influenza A by PCR NEGATIVE NEGATIVE Final   Influenza B by PCR NEGATIVE NEGATIVE Final    Comment: (NOTE) The Xpert Xpress SARS-CoV-2/FLU/RSV plus assay is intended as an aid in the diagnosis of influenza from Nasopharyngeal swab specimens and should not be used as a sole basis for treatment. Nasal washings and aspirates are unacceptable for Xpert Xpress SARS-CoV-2/FLU/RSV testing.  Fact Sheet for Patients: EntrepreneurPulse.com.au  Fact Sheet for Healthcare Providers: IncredibleEmployment.be  This test is not yet approved or cleared by the [place] FDA and has been authorized for detection and/or diagnosis of SARS-CoV-2 by FDA under an Emergency Use Authorization (EUA). This EUA will remain in effect (meaning this test can be used) for the duration of the COVID-19 declaration under Section 564(b)(1) of the Act, 21 U.S.C. section 360bbb-3(b)(1), unless the authorization is terminated or revoked.     Resp Syncytial Virus by PCR NEGATIVE NEGATIVE Final    Comment: (NOTE) Fact Sheet for Patients: EntrepreneurPulse.com.au  Fact Sheet for Healthcare Providers: IncredibleEmployment.be  This test is not yet approved or cleared by the [place] FDA and has been authorized for detection and/or diagnosis of SARS-CoV-2 by FDA under an Emergency Use Authorization (EUA). This EUA will remain in effect (meaning this test can be used) for  the duration of the COVID-19 declaration under Section 564(b)(1) of the Act, 21 U.S.C. section 360bbb-3(b)(1), unless the authorization is terminated or revoked.  Performed at [facility], [place] [address], [place], [place] [address]   MRSA PCR Screening     Status: None   Collection Time: 02/16/20 12:33 PM   Specimen: Nasal Mucosa; Nasopharyngeal  Result Value Ref Range Status   MRSA by PCR NEGATIVE NEGATIVE Final    Comment:        The GeneXpert MRSA Assay (FDA approved for NASAL specimens only), is one component of a comprehensive MRSA colonization surveillance program. It is not intended to diagnose MRSA infection nor to guide or monitor treatment for MRSA infections. Performed at [facility], [place] [address], [place], [place] [address]          Radiology Studies: CT CHEST WO CONTRAST  Result Date: 02/15/2020 CLINICAL DATA:  Pneumonia HIV positive. EXAM: CT CHEST WITHOUT CONTRAST TECHNIQUE: Multidetector CT imaging of the chest was performed following the standard protocol without IV contrast. COMPARISON:  Chest radiograph 02/14/2020 FINDINGS: Cardiovascular: Coronary artery calcification and aortic atherosclerotic calcification. Mediastinum/Nodes: Enlarged paratracheal lymph node measures 14 mm. Subcarinal node measures 12 mm. Tracheostomy tube in good position.  Esophagus normal Lungs/Pleura: No airspace disease to suggest pneumonia. No pulmonary edema. No ground-glass opacities. Upper Abdomen: Limited view of the liver, kidneys, pancreas are unremarkable. Normal adrenal glands. Musculoskeletal: No aggressive osseous lesion. IMPRESSION: 1. No evidence of pneumonia. 2. Mild mediastinal adenopathy may relate to HIV adenopathy 3. Coronary artery calcification and Aortic Atherosclerosis (ICD10-I70.0). Electronically Signed   By: Suzy Bouchard M.D.   On: 02/15/2020 14:55        Scheduled Meds: . acetaminophen (TYLENOL) oral liquid 160 mg/5 mL  650 mg Per Tube  Q6H  . albuterol  2.5 mg Nebulization Q6H  . amLODipine  10 mg Per Tube Daily  . baclofen  30 mg Per Tube TID  . chlorhexidine gluconate (MEDLINE KIT)  15 mL Mouth Rinse BID  . Darunavir-Cobicisctat-Emtricitabine-Tenofovir Alafenamide  1 tablet Oral Q breakfast  . famotidine  10.4 mg Per Tube  BID  . feeding supplement (OSMOLITE 1.5 CAL)  237 mL Per Tube Q24H  . feeding supplement (OSMOLITE 1.5 CAL)  474 mL Per Tube TID  . feeding supplement (PROSource TF)  45 mL Per Tube TID  . fiber  1 packet Per Tube BID  . folic acid  1 mg Per Tube Daily  . free water  200 mL Per Tube Q4H  . gabapentin  200 mg Per Tube Q8H  . heparin injection (subcutaneous)  5,000 Units Subcutaneous Q8H  . levETIRAcetam  1,500 mg Per Tube BID  . Muscle Rub   Topical QID  . nitrofurantoin  100 mg Per Tube Q12H  . nystatin  5 mL Oral QID  . oxyCODONE  5 mg Per Tube Q8H  . QUEtiapine  12.5 mg Per Tube QHS  . scopolamine  1 patch Transdermal Q72H  . sulfamethoxazole-trimethoprim  1 tablet Per Tube Once per day on Mon Wed Fri  . thiamine  100 mg Per Tube Daily  . tiZANidine  6 mg Per Tube Q6H   Continuous Infusions:   LOS: 108 days    Time spent: 25 minutes    Barb Merino, MD [facility] Pager [phone]

## 2020-02-18 LAB — COMPREHENSIVE METABOLIC PANEL
ALT: 16 U/L (ref 0–44)
AST: 24 U/L (ref 15–41)
Albumin: 3.4 g/dL — ABNORMAL LOW (ref 3.5–5.0)
Alkaline Phosphatase: 107 U/L (ref 38–126)
Anion gap: 15 (ref 5–15)
BUN: 19 mg/dL (ref 6–20)
CO2: 26 mmol/L (ref 22–32)
Calcium: 10.6 mg/dL — ABNORMAL HIGH (ref 8.9–10.3)
Chloride: 102 mmol/L (ref 98–111)
Creatinine, Ser: 0.69 mg/dL (ref 0.61–1.24)
GFR, Estimated: >60 mL/min (ref >60)
Glucose, Bld: 98 mg/dL (ref 70–99)
Potassium: 3.9 mmol/L (ref 3.5–5.1)
Sodium: 143 mmol/L (ref 135–145)
Total Bilirubin: 0.5 mg/dL (ref 0.3–1.2)
Total Protein: 7.8 g/dL (ref 6.5–8.1)

## 2020-02-18 LAB — CBC WITH DIFFERENTIAL/PLATELET
Abs Immature Granulocytes: 0.03 10*3/uL (ref 0.00–0.07)
Basophils Absolute: 0 10*3/uL (ref 0.0–0.1)
Basophils Relative: 1 %
Eosinophils Absolute: 0.2 10*3/uL (ref 0.0–0.5)
Eosinophils Relative: 5 %
HCT: 44.2 % (ref 39.0–52.0)
Hemoglobin: 14.4 g/dL (ref 13.0–17.0)
Immature Granulocytes: 1 %
Lymphocytes Relative: 23 %
Lymphs Abs: 0.9 10*3/uL (ref 0.7–4.0)
MCH: 30.5 pg (ref 26.0–34.0)
MCHC: 32.6 g/dL (ref 30.0–36.0)
MCV: 93.6 fL (ref 80.0–100.0)
Monocytes Absolute: 0.4 10*3/uL (ref 0.1–1.0)
Monocytes Relative: 10 %
Neutro Abs: 2.3 10*3/uL (ref 1.7–7.7)
Neutrophils Relative %: 60 %
Platelets: 132 10*3/uL — ABNORMAL LOW (ref 150–400)
RBC: 4.72 MIL/uL (ref 4.22–5.81)
RDW: 12.6 % (ref 11.5–15.5)
WBC: 3.8 10*3/uL — ABNORMAL LOW (ref 4.0–10.5)
nRBC: 0 % (ref 0.0–0.2)

## 2020-02-18 LAB — GLUCOSE, CAPILLARY
Glucose-Capillary: 100 mg/dL — ABNORMAL HIGH (ref 70–99)
Glucose-Capillary: 94 mg/dL (ref 70–99)
Glucose-Capillary: 72 mg/dL (ref 70–99)
Glucose-Capillary: 96 mg/dL (ref 70–99)
Glucose-Capillary: 119 mg/dL — ABNORMAL HIGH (ref 70–99)

## 2020-02-18 LAB — MAGNESIUM: Magnesium: 1.8 mg/dL (ref 1.7–2.4)

## 2020-02-18 LAB — PHOSPHORUS: Phosphorus: 4 mg/dL (ref 2.5–4.6)

## 2020-02-18 MED ORDER — ALBUTEROL SULFATE (2.5 MG/3ML) 0.083% IN NEBU
2.5000 mg | INHALATION_SOLUTION | Freq: Two times a day (BID) | RESPIRATORY_TRACT | Status: DC
  Administered 2020-02-19: 2.5 mg via RESPIRATORY_TRACT
  Filled 2020-02-18: qty 3

## 2020-02-18 NOTE — Progress Notes (Signed)
PROGRESS NOTE    Ryan Orozco  VOJ:[id number] DOB: 1965/12/16 DOA: 11/01/2019 PCP: Default, Provider, MD    Brief Narrative:  55 year old gentleman with history of HIV, chronic alcohol abuse admitted to the hospital on 11/01/2019 when he was found unresponsive outside of a convenience store.  PEA arrest unknown downtime, successfully resuscitated and brought to the ER.  CT head unremarkable.  Failed extubation trials due to severe anoxic brain injury, now with PEG and trach. Remains in the hospital, unable to transfer to nursing home. Severely spastic tetraplegia. Significant timeline of events,  9/29 Admit post PEA arrest. UDS positive for THC, benzo's. ETOH 177 10/05 EEG ongoing. Versed restarted overnight due to agitation. Nicardipine increased.  10/06 Developed tachypnea with WUA, no follow commands off sedation  10/07 Vomiting, TF held / restarted with recurrent vomiting 10/16 tracheostomy collar for 9 hours 10/18->10/19 off vent all night  10/20> TC 11/15 > downsized to #4 Shiley flex CFS 02/15/2020, low-grade fever, increased respiratory secretions and congestion.  Transiently treated with broad-spectrum antibiotics, CT scan of the chest negative for pneumonia.  Antibiotic discontinued. Blood cultures negative.  Urine culture with more than 100,000 colonies of Enterococcus faecalis pansensitive, will treat with nitrofurantoin for 7 days.    Assessment & Plan:   Active Problems:   Cardiac arrest [facility])   Community acquired pneumonia of left upper lobe of lung   Anoxic brain injury ([facility])   Alcohol abuse   Acute respiratory failure with hypoxia (HCC)   Vomiting   Pressure injury of skin   Small bowel obstruction (HCC)   Palliative care encounter   Bowel obstruction (HCC)   Chronic respiratory failure (HCC)   Diastolic dysfunction   Tracheostomy dependent (HCC)   Dysphagia   Protein-calorie malnutrition (HCC)   Seizures (HCC)   Bilateral recurrent inguinal  hernia   Muscle spasticity   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HCC)   Tracheobronchitis   Sequela of Corynebacterium infection   Abnormal urinalysis   Urinary tract infection due to Enterococcus   Fever  Anoxic brain injury secondary to PEA arrest, pain syndrome secondary to hypertonicity and spastic tetraplegia: Conservative management.  Baclofen, gabapentin, oxycodone and Zanaflex. 1/6, Botox injections biceps femoris, quadriceps and rectus femoris. Low-dose Seroquel Scheduled Tylenol All supportive treatment Tracheostomy, now on trach collar SpO2: 98 % O2 Flow Rate (L/min): 5 L/min FiO2 (%): 21 % PEG tube feeding, continue 3 times a day feeding. Adequately hydrated.  Discontinue IV fluids.  Decrease free water flush.  SIRS/low-grade fever: 1/13, other test negative. Blood cultures negative.  COVID influenza and RSV negative. Urine culture with more than 100,000 colonies of Enterococcus faecalis.  Will treat with 7 days of nitrofurantoin.  Patient is on prophylaxis dose of Bactrim. 12/30, 80,000 colonies of Enterococcus, treated with 5 days of Augmentin.  Normal catheter.  HIV: On Symtuza and Bactrim.    DVT prophylaxis: heparin injection 5,000 Units Start: 11/16/19 2200 SCDs Start: 11/01/19 2113   Code Status: Full code Family Communication: Updated patient's daughter on the phone. Disposition Plan: Status is: Inpatient  Remains inpatient appropriate because:Unsafe d/c plan   Dispo: The patient is from: Home              Anticipated d/c is to: SNF              Anticipated d/c date is: 2 days              Patient currently is medically stable to d/c. To transfer to a  skilled level of care whenever bed is available.      Consultants:   Multiple consults  Procedures:   Multiple as above  Antimicrobials:   Bactrim, 3 times a week  Nitrofurantoin 100 mg twice a day 1/15-----   Subjective: Patient seen and examined.  No overnight events.  Today  he was looking comfortable.  He is trying to say something, however his voice is not understandable.  Sounds like he is saying "help, help".  Objective: Vitals:   02/17/20 2353 02/18/20 0317 02/18/20 0500 02/18/20 0802  BP: 112/72 122/67    Pulse:  89  86  Resp: 18 18  16   Temp: 97.9 F (36.6 C) 97.9 F (36.6 C)    TempSrc: Oral Oral    SpO2: 98% 96%  98%  Weight:   75.5 kg   Height:        Intake/Output Summary (Last 24 hours) at 02/18/2020 1051 Last data filed at 02/18/2020 0500 Gross per 24 hour  Intake --  Output 2800 ml  Net -2800 ml   Filed Weights   02/10/20 0500 02/13/20 0750 02/18/20 0500  Weight: 74.5 kg 70.5 kg 75.5 kg    Examination:  General exam: Appears calm, sick looking.  Looked comfortable. Remains on trach collar on 21% oxygen.  Has mostly conducted airway sounds. Respiratory system: Conducted airway sounds. Cardiovascular system: S1 & S2 heard,  Gastrointestinal system: Soft.  Nontender.  Bowel sounds present.  PEG tube site clean and dry.   Central nervous system: Alert and awake.  Does not interact.  Spastic lower extremities.  Spastic upper extremities.   Data Reviewed: I have personally reviewed following labs and imaging studies  CBC: Recent Labs  Lab 02/14/20 2242 02/16/20 0537 02/18/20 0400  WBC 5.8 4.3 3.8*  NEUTROABS  --   --  2.3  HGB 14.2 15.9 14.4  HCT 43.0 47.8 44.2  MCV 95.1 94.8 93.6  PLT 174 131* 242*   Basic Metabolic Panel: Recent Labs  Lab 02/15/20 0433 02/16/20 0537 02/18/20 0400  NA  --  144 143  K  --  3.7 3.9  CL  --  106 102  CO2  --  23 26  GLUCOSE  --  109* 98  BUN  --  18 19  CREATININE 0.77 0.64 0.69  CALCIUM  --  10.2 10.6*  MG  --   --  1.8  PHOS  --   --  4.0   GFR: Estimated Creatinine Clearance: 94.1 mL/min (by C-G formula based on SCr of 0.69 mg/dL). Liver Function Tests: Recent Labs  Lab 02/16/20 0537 02/18/20 0400  AST 24 24  ALT 17 16  ALKPHOS 89 107  BILITOT 0.5 0.5  PROT 7.1 7.8   ALBUMIN 3.1* 3.4*   No results for input(s): LIPASE, AMYLASE in the last 168 hours. No results for input(s): AMMONIA in the last 168 hours. Coagulation Profile: No results for input(s): INR, PROTIME in the last 168 hours. Cardiac Enzymes: No results for input(s): CKTOTAL, CKMB, CKMBINDEX, TROPONINI in the last 168 hours. BNP (last 3 results) No results for input(s): PROBNP in the last 8760 hours. HbA1C: No results for input(s): HGBA1C in the last 72 hours. CBG: Recent Labs  Lab 02/17/20 1632 02/17/20 2001 02/17/20 2309 02/18/20 0342 02/18/20 0758  GLUCAP 86 185* 93 100* 94   Lipid Profile: No results for input(s): CHOL, HDL, LDLCALC, TRIG, CHOLHDL, LDLDIRECT in the last 72 hours. Thyroid Function Tests: No results for input(s): TSH,  T4TOTAL, FREET4, T3FREE, THYROIDAB in the last 72 hours. Anemia Panel: No results for input(s): VITAMINB12, FOLATE, FERRITIN, TIBC, IRON, RETICCTPCT in the last 72 hours. Sepsis Labs: Recent Labs  Lab 02/14/20 2242 02/15/20 0433 02/15/20 1154 02/16/20 0733 02/16/20 0757  PROCALCITON <0.10  --   --  <0.10  --   LATICACIDVEN 2.2* 2.5* 2.1*  --  2.3*    Recent Results (from the past 240 hour(s))  Culture, Urine     Status: Abnormal   Collection Time: 02/14/20  4:38 AM   Specimen: Urine, Random  Result Value Ref Range Status   Specimen Description URINE, RANDOM  Final   Special Requests   Final    NONE Performed at [facility], [place] [address], [place], [place] [address]    Culture >=100,000 COLONIES/mL ENTEROCOCCUS FAECALIS (A)  Final   Report Status 02/17/2020 FINAL  Final   Organism ID, Bacteria ENTEROCOCCUS FAECALIS (A)  Final      Susceptibility   Enterococcus faecalis - MIC*    AMPICILLIN <=2 SENSITIVE Sensitive     NITROFURANTOIN <=16 SENSITIVE Sensitive     VANCOMYCIN 1 SENSITIVE Sensitive     * >=100,000 COLONIES/mL ENTEROCOCCUS FAECALIS  Culture, blood (routine x 2)     Status: None (Preliminary result)    Collection Time: 02/14/20 10:42 PM   Specimen: BLOOD RIGHT ARM  Result Value Ref Range Status   Specimen Description BLOOD RIGHT ARM  Final   Special Requests   Final    BOTTLES DRAWN AEROBIC AND ANAEROBIC Blood Culture adequate volume   Culture   Final    NO GROWTH 3 DAYS Performed at [facility], [address] [place], [place] [address]    Report Status PENDING  Incomplete  Culture, blood (routine x 2)     Status: None (Preliminary result)   Collection Time: 02/14/20 10:42 PM   Specimen: BLOOD RIGHT HAND  Result Value Ref Range Status   Specimen Description BLOOD RIGHT HAND  Final   Special Requests   Final    BOTTLES DRAWN AEROBIC AND ANAEROBIC Blood Culture adequate volume   Culture   Final    NO GROWTH 3 DAYS Performed at [facility], [place] [address] [place], [place] [address]    Report Status PENDING  Incomplete  Resp panel by RT-PCR (RSV, Flu A&B, Covid) Nasopharyngeal Swab     Status: None   Collection Time: 02/16/20  9:39 AM   Specimen: Nasopharyngeal Swab; Nasopharyngeal(NP) swabs in vial transport medium  Result Value Ref Range Status   SARS Coronavirus 2 by RT PCR NEGATIVE NEGATIVE Final    Comment: (NOTE) SARS-CoV-2 target nucleic acids are NOT DETECTED.  The SARS-CoV-2 RNA is generally detectable in upper respiratory specimens during the acute phase of infection. The lowest concentration of SARS-CoV-2 viral copies this assay can detect is 138 copies/mL. A negative result does not preclude SARS-Cov-2 infection and should not be used as the sole basis for treatment or other patient management decisions. A negative result may occur with  improper specimen collection/handling, submission of specimen other than nasopharyngeal swab, presence of viral mutation(s) within the areas targeted by this assay, and inadequate number of viral copies(<138 copies/mL). A negative result must be combined with clinical observations, patient history, and  epidemiological information. The expected result is Negative.  Fact Sheet for Patients:  EntrepreneurPulse.com.au  Fact Sheet for Healthcare Providers:  IncredibleEmployment.be  This test is no t yet approved or cleared by the  [place] [employer] and  has been authorized for detection and/or diagnosis of SARS-CoV-2 by FDA under an Print production planner (EUA). This EUA will remain  in effect (meaning this test can be used) for the duration of the COVID-19 declaration under Section 564(b)(1) of the Act, 21 U.S.C.section 360bbb-3(b)(1), unless the authorization is terminated  or revoked sooner.       Influenza A by PCR NEGATIVE NEGATIVE Final   Influenza B by PCR NEGATIVE NEGATIVE Final    Comment: (NOTE) The Xpert Xpress SARS-CoV-2/FLU/RSV plus assay is intended as an aid in the diagnosis of influenza from Nasopharyngeal swab specimens and should not be used as a sole basis for treatment. Nasal washings and aspirates are unacceptable for Xpert Xpress SARS-CoV-2/FLU/RSV testing.  Fact Sheet for Patients: EntrepreneurPulse.com.au  Fact Sheet for Healthcare Providers: IncredibleEmployment.be  This test is not yet approved or cleared by the [place] FDA and has been authorized for detection and/or diagnosis of SARS-CoV-2 by FDA under an Emergency Use Authorization (EUA). This EUA will remain in effect (meaning this test can be used) for the duration of the COVID-19 declaration under Section 564(b)(1) of the Act, 21 U.S.C. section 360bbb-3(b)(1), unless the authorization is terminated or revoked.     Resp Syncytial Virus by PCR NEGATIVE NEGATIVE Final    Comment: (NOTE) Fact Sheet for Patients: EntrepreneurPulse.com.au  Fact Sheet for Healthcare Providers: IncredibleEmployment.be  This test is not yet approved or cleared by the [place] FDA and has been  authorized for detection and/or diagnosis of SARS-CoV-2 by FDA under an Emergency Use Authorization (EUA). This EUA will remain in effect (meaning this test can be used) for the duration of the COVID-19 declaration under Section 564(b)(1) of the Act, 21 U.S.C. section 360bbb-3(b)(1), unless the authorization is terminated or revoked.  Performed at [facility], [place] [address] [place], [place] [address]   MRSA PCR Screening     Status: None   Collection Time: 02/16/20 12:33 PM   Specimen: Nasal Mucosa; Nasopharyngeal  Result Value Ref Range Status   MRSA by PCR NEGATIVE NEGATIVE Final    Comment:        The GeneXpert MRSA Assay (FDA approved for NASAL specimens only), is one component of a comprehensive MRSA colonization surveillance program. It is not intended to diagnose MRSA infection nor to guide or monitor treatment for MRSA infections. Performed at [facility], [place] [address], [place], [place] [address]          Radiology Studies: No results found.      Scheduled Meds: . acetaminophen (TYLENOL) oral liquid 160 mg/5 mL  650 mg Per Tube Q6H  . albuterol  2.5 mg Nebulization TID  . amLODipine  10 mg Per Tube Daily  . baclofen  30 mg Per Tube TID  . chlorhexidine gluconate (MEDLINE KIT)  15 mL Mouth Rinse BID  . Darunavir-Cobicisctat-Emtricitabine-Tenofovir Alafenamide  1 tablet Oral Q breakfast  . famotidine  10.4 mg Per Tube BID  . feeding supplement (OSMOLITE 1.5 CAL)  237 mL Per Tube Q24H  . feeding supplement (OSMOLITE 1.5 CAL)  474 mL Per Tube TID  . feeding supplement (PROSource TF)  45 mL Per Tube TID  . fiber  1 packet Per Tube BID  . folic acid  1 mg Per Tube Daily  . free water  200 mL Per Tube Q4H  . gabapentin  200 mg Per Tube Q8H  . heparin injection (subcutaneous)  5,000 Units Subcutaneous Q8H  .  levETIRAcetam  1,500 mg Per Tube BID  . Muscle Rub   Topical QID  . nitrofurantoin  100 mg Per Tube Q12H  . nystatin  5 mL Oral QID   . oxyCODONE  5 mg Per Tube Q8H  . QUEtiapine  12.5 mg Per Tube QHS  . scopolamine  1 patch Transdermal Q72H  . sulfamethoxazole-trimethoprim  1 tablet Per Tube Once per day on Mon Wed Fri  . thiamine  100 mg Per Tube Daily  . tiZANidine  6 mg Per Tube Q6H   Continuous Infusions:   LOS: 109 days    Time spent: 25 minutes    Barb Merino, MD [facility] Pager [phone]

## 2020-02-19 LAB — GLUCOSE, CAPILLARY
Glucose-Capillary: 102 mg/dL — ABNORMAL HIGH (ref 70–99)
Glucose-Capillary: 113 mg/dL — ABNORMAL HIGH (ref 70–99)
Glucose-Capillary: 118 mg/dL — ABNORMAL HIGH (ref 70–99)
Glucose-Capillary: 124 mg/dL — ABNORMAL HIGH (ref 70–99)
Glucose-Capillary: 126 mg/dL — ABNORMAL HIGH (ref 70–99)
Glucose-Capillary: 138 mg/dL — ABNORMAL HIGH (ref 70–99)
Glucose-Capillary: 141 mg/dL — ABNORMAL HIGH (ref 70–99)

## 2020-02-19 LAB — HIV-1 RNA QUANT-NO REFLEX-BLD
HIV 1 RNA Quant: <20 copies/mL
LOG10 HIV-1 RNA: UNDETERMINED log10copy/mL

## 2020-02-19 MED ORDER — ALBUTEROL SULFATE (2.5 MG/3ML) 0.083% IN NEBU: 2.5000 mg | INHALATION_SOLUTION | Freq: Four times a day (QID) | RESPIRATORY_TRACT | Status: DC | PRN

## 2020-02-19 NOTE — Progress Notes (Signed)
[facility] PROGRESS NOTE  Ryan Orozco FGB:[id number] DOB: [birth year] DOA: 11/01/2019 PCP: Default, Provider, MD    11/16   Status: Remains inpatient appropriate because:Altered mental status, Unsafe d/c plan and Inpatient level of care appropriate due to severity of illness. Patient newly diagnosed with anoxic brain injury  Dispo: The patient is from: Home              Anticipated d/c is to: SNF              Anticipated d/c date is: > 3 days              Patient currently is medically stable to d/c.  Barriers to discharge: No SNF bed offers.  Needs trach capable facility. TOC communicating with financial counseling team who have yet to hear back from DSS regarding patient's assigned caseworker.  TOC spoke with [facility] rehab.  Patient would be an excellent candidate but they request he have active Medicaid and disability before they can accept him.  Also faxed out information to [name] as of 1/10   Code Status: Full Family Communication: 1/14 dtr [name] updated DVT prophylaxis: Subcutaneous heparin Vaccination status: Will order first dose Pfizer COVID-vaccine on 1/10; next week we will order pneumococcal and influenza vaccinations  Foley catheter:  No  HPI: 55 year old male with HIV, chronic alcohol abuse who was presented to the emergency department after being found unresponsive outside a convinient store.  EMS found him pulseless in PEA, unknown downtime, successfully resuscitated and brought to the emergency department.  UDS positive for THC and benzodiazepines.  CT head unremarkable.  Failed extubation trials due to severe anoxic brain injury and had tracheostomy placed.  PEG placed on 10/15.  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  This has led to significant pain as well.  [name] was consulted and recommendations/input/orders highly appreciated.  She may also benefit  from Botox injections to both hips to aid in treatment of spasticity.   Significant Hospital Events   9/29 Admit post PEA arrest. UDS positive for THC, benzo's. ETOH 177 10/05 EEG ongoing. Versed restarted overnight due to agitation. Nicardipine increased.  10/06 Developed tachypnea with WUA, no follow commands off sedation  10/07 Vomiting, TF held / restarted with recurrent vomiting 10/16 tracheostomy collar for 9 hours 10/18->10/19 off vent all night  10/20> TC 11/15 > downsized to #4 Shiley flex CFS  Subjective: Awake.  Smiling today.  Attempting to communicate by repeatedly stating the word "hey"  Objective: Vitals:   02/19/20 0409 02/19/20 0706  BP: 132/82   Pulse: 92 86  Resp: 18 18  Temp: 98 F (36.7 C)   SpO2: 98% 94%    Intake/Output Summary (Last 24 hours) at 02/19/2020 0752 Last data filed at 02/18/2020 1313 Gross per 24 hour  Intake 1348 ml  Output 900 ml  Net 448 ml   Filed Weights   02/13/20 0750 02/18/20 0500 02/19/20 0420  Weight: 70.5 kg 75.5 kg 73 kg    Exam: General: Alert, no acute distress, calm Pulmonary: Bilateral lung sounds are coarse to auscultation but clear, trach without any significant drainage, FiO2 21%, no increased work of breathing Cardiac: Also is regular and nontachycardic, heart sounds S1-S2, adequate capillary refill in extremities are warm to touch Abdomen: PEG in placed for tube feedings, abdomen soft nondistended and nontender.  Normoactive bowel sounds.  LBM 1/13 Neurological: Awake interactive today. Continues with hypertonicity worse in the  lower extremities involving the hip flexors and hamstrings noting has received Botox injection on 1/6. Of note PT/OT able to extend right leg from knee 90 degrees on 1/11. Has limited use of upper extremities with action primarily initiated from the shoulder girth.  Assessment/Plan: Acute problems: Anoxic brain injury 2/2 PEA arrest/pain syndrome secondary to hypertonicity/spastic  tetraplegia:  -Presumed 2/2 combination alcohol and BZD overdose with UDS positive for THC and BZDs -Significant brain injury with underlying spastic tetraplegia long-term SNF placement recommended-continue PT/OT/SLP -Continue baclofen, gabapentin, scheduled oxycodone and Zanaflex  -Appreciate assistance of Dr. Kathleene Hazel dose has been uptitrated to 6 mg every 6 hours -1/6 Botox injection into the quadriceps and rectus femoris as well as the biceps femoris and hamstrings, semimembranosus and semitendinosus.  Dr. Eda Keys has documented that Botox typically takes 7 to 10 days before effect is seen.  Unable to inject iliopsoas without EMG guidance. -Continue bilateral upper extremity WHO resting splints along with PROM per nursing staff every 4 hours -Continue low-dose Seroquel for brain injury sequela -Continue scheduled Tylenol for pain along with Crea muscle rub -Continue KREG rotational bed   SIRS/Fever 2/2 recurrent enterococcus UTI -Patient developed mild elevation in temperature overnight to 1/13 associated with slight increase in respiratory secretions and congested sounding cough/abnormal pulmonary exam as well as tachycardia./14 respiratory status much improved and no further fevers. -Respiratory viral panel neg for COVID, influenza and RSV.   -UA slightly abnormal/cx c/w enterococcus UTI- started on nitrofurantoin (prior UTI treated w/ augmentin) -X-ray with bilateral opacities concerning for early pneumonia but CT of the chest without pneumonia, broad-spectrum antibiotics dc'd 1/14 -Because of dehydration increased frequency of free water from 200 cc every 4 hours to every 2 hours on 1/13- stopped on 1/15  Chronic respiratory failure with inability to maintain patent airway requiring chronic tracheostomy tube/Recent Corynebacterium tracheobronchitis (resolved) -See above regarding fever work-up -Continue PMV training per SLP-#4 cuffless trach in place-trach changed out on 1/10  RT -Not a candidate for decannulation until patient can reliably follow commands, cough and clear airway and it appears current mentation will likely be patient's new baseline -continue physiotherapy with Vibra vest along with supervised PMV trials  HIV:  -CD4 count of 124 October 2021 (had been as high as 250 Dec 2020)-as of today up to 259 -Emesis recommends repeating viral load which has been ordered on 1/14 -Dc'd Tivicay and Descovy infavor of Biktarvy to for eventual dc to SNF; per my discussion with infectious disease physician [name] on 12/23 preferred regimen would be Descovy and Tivicay-she will contact the HIV pharmacist to attempt to obtain discount cards to make this a more affordable option.  Once this has been confirmed will transition back to Descovy and Tivicay -CD4 count on 12/23 up to 259 -ID recommends continuing prophylactic Bactrim an additional 3 months  Dysphagia 2/2 anoxic brain injury -Continue tube feeding per PEG -SLP attempted to introduce oral substances on 12/27 but patient did not accept and was pushing out with his tongue.  Given his acute UTI he may have been nauseated and this may have been reason for him not wishing to eat.  Protein calorie malnutrition nutrition Status: Nutrition Problem: Increased nutrient needs Etiology: acute illness Signs/Symptoms: estimated needs Interventions: Tube feeding bolus doses of Osmolite, Juven twice daily, Prosource TF 45 mL 3 times daily Estimated body mass index is 25.98 kg/m as calculated from the following:   Height as of this encounter: 5' 6"  (1.676 m).   Weight as of this encounter:  73 kg.   Multiple decubiti not POA Wound / Incision (Open or Dehisced) 11/17/19 Puncture Abdomen Left;Anterior;Upper G-tube insertion site  (Active)  Date First Assessed/Time First Assessed: 11/17/19 1646   Wound Type: Puncture  Location: Abdomen  Location Orientation: Left;Anterior;Upper  Wound Description (Comments): G-tube  insertion site   Present on Admission: No    Assessments 11/17/2019  4:47 PM 02/18/2020 12:20 PM  Dressing Type Gauze (Comment);Tape dressing Gauze (Comment)  Dressing Changed New Changed  Dressing Status Clean;Dry;Intact -  Dressing Change Frequency PRN -  Site / Wound Assessment Clean;Dry;Pink -  Peri-wound Assessment Intact -  Closure None -  Drainage Amount None -  Treatment Cleansed -     No Linked orders to display      Other problems: Hypertension/grade 1 diastolic dysfunction:  -Continue amlodipine  -Echocardiogram this admission with preserved LVEF with evidence of grade   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   -Continue Keppra; levetiracetam level 35.9 on 11/12 -no further seizure activity since transitioned out of ICU setting therefore will discontinue seizure precaution  Inguinal hernias -Evaluated by general surgery this admission. Documented as chronically incarcerated. Patient has been clinically stable and tolerating tube feedings and having bowel movements so unless patient obstructed would not be considered a candidate for repair. Even if obstructed consulting surgeon was not sure he would be a candidate for hernia repair regardless . Enterococcus faecalis UTI -Urine culture positive for pansensitive Enterococcus-empiric Levaquin discontinued on 12/30 in favor of Augmentin and completed 5 days of therapy  Goals of care:  -Palliative care was involved during this hospitalization.   -Goals of care were discussed. remains full code.  -Palliative care recommended outpatient follow-up with palliative providers. -May need to revisit end-of-life goals of care since patient making poor progress and currently having significant pain related to hypertonicity from brain injury   Data Reviewed: Basic Metabolic Panel: Recent Labs  Lab 02/15/20 0433 02/16/20 0537 02/18/20 0400  NA  --  144 143  K  --  3.7 3.9  CL  --  106 102  CO2  --  23 26   GLUCOSE  --  109* 98  BUN  --  18 19  CREATININE 0.77 0.64 0.69  CALCIUM  --  10.2 10.6*  MG  --   --  1.8  PHOS  --   --  4.0   Liver Function Tests: Recent Labs  Lab 02/16/20 0537 02/18/20 0400  AST 24 24  ALT 17 16  ALKPHOS 89 107  BILITOT 0.5 0.5  PROT 7.1 7.8  ALBUMIN 3.1* 3.4*   No results for input(s): LIPASE, AMYLASE in the last 168 hours. No results for input(s): AMMONIA in the last 168 hours. CBC: Recent Labs  Lab 02/14/20 2242 02/16/20 0537 02/18/20 0400  WBC 5.8 4.3 3.8*  NEUTROABS  --   --  2.3  HGB 14.2 15.9 14.4  HCT 43.0 47.8 44.2  MCV 95.1 94.8 93.6  PLT 174 131* 132*   Cardiac Enzymes: No results for input(s): CKTOTAL, CKMB, CKMBINDEX, TROPONINI in the last 168 hours. BNP (last 3 results) Recent Labs    02/15/20 0433  BNP 3.9    ProBNP (last 3 results) No results for input(s): PROBNP in the last 8760 hours.  CBG: Recent Labs  Lab 02/18/20 1635 02/18/20 2037 02/19/20 0001 02/19/20 0406 02/19/20 0733  GLUCAP 96 119* 118* 102* 113*    Recent Results (from the past 240 hour(s))  Culture, Urine  Status: Abnormal   Collection Time: 02/14/20  4:38 AM   Specimen: Urine, Random  Result Value Ref Range Status   Specimen Description URINE, RANDOM  Final   Special Requests   Final    NONE Performed at [facility], [place] [address], [place], [place] [address]    Culture >=100,000 COLONIES/mL ENTEROCOCCUS FAECALIS (A)  Final   Report Status 02/17/2020 FINAL  Final   Organism ID, Bacteria ENTEROCOCCUS FAECALIS (A)  Final      Susceptibility   Enterococcus faecalis - MIC*    AMPICILLIN <=2 SENSITIVE Sensitive     NITROFURANTOIN <=16 SENSITIVE Sensitive     VANCOMYCIN 1 SENSITIVE Sensitive     * >=100,000 COLONIES/mL ENTEROCOCCUS FAECALIS  Culture, blood (routine x 2)     Status: None (Preliminary result)   Collection Time: 02/14/20 10:42 PM   Specimen: BLOOD RIGHT ARM  Result Value Ref Range Status   Specimen Description  BLOOD RIGHT ARM  Final   Special Requests   Final    BOTTLES DRAWN AEROBIC AND ANAEROBIC Blood Culture adequate volume   Culture   Final    NO GROWTH 3 DAYS Performed at [facility], [address], [place], [place] [address]    Report Status PENDING  Incomplete  Culture, blood (routine x 2)     Status: None (Preliminary result)   Collection Time: 02/14/20 10:42 PM   Specimen: BLOOD RIGHT HAND  Result Value Ref Range Status   Specimen Description BLOOD RIGHT HAND  Final   Special Requests   Final    BOTTLES DRAWN AEROBIC AND ANAEROBIC Blood Culture adequate volume   Culture   Final    NO GROWTH 3 DAYS Performed at [facility], [place] [address] [place], [place] [address]    Report Status PENDING  Incomplete  Resp panel by RT-PCR (RSV, Flu A&B, Covid) Nasopharyngeal Swab     Status: None   Collection Time: 02/16/20  9:39 AM   Specimen: Nasopharyngeal Swab; Nasopharyngeal(NP) swabs in vial transport medium  Result Value Ref Range Status   SARS Coronavirus 2 by RT PCR NEGATIVE NEGATIVE Final    Comment: (NOTE) SARS-CoV-2 target nucleic acids are NOT DETECTED.  The SARS-CoV-2 RNA is generally detectable in upper respiratory specimens during the acute phase of infection. The lowest concentration of SARS-CoV-2 viral copies this assay can detect is 138 copies/mL. A negative result does not preclude SARS-Cov-2 infection and should not be used as the sole basis for treatment or other patient management decisions. A negative result may occur with  improper specimen collection/handling, submission of specimen other than nasopharyngeal swab, presence of viral mutation(s) within the areas targeted by this assay, and inadequate number of viral copies(<138 copies/mL). A negative result must be combined with clinical observations, patient history, and epidemiological information. The expected result is Negative.  Fact Sheet for Patients:   EntrepreneurPulse.com.au  Fact Sheet for Healthcare Providers:  IncredibleEmployment.be  This test is no t yet approved or cleared by the [place] FDA and  has been authorized for detection and/or diagnosis of SARS-CoV-2 by FDA under an Emergency Use Authorization (EUA). This EUA will remain  in effect (meaning this test can be used) for the duration of the COVID-19 declaration under Section 564(b)(1) of the Act, 21 U.S.C.section 360bbb-3(b)(1), unless the authorization is terminated  or revoked sooner.       Influenza A by PCR NEGATIVE NEGATIVE Final   Influenza B by PCR NEGATIVE NEGATIVE  Final    Comment: (NOTE) The Xpert Xpress SARS-CoV-2/FLU/RSV plus assay is intended as an aid in the diagnosis of influenza from Nasopharyngeal swab specimens and should not be used as a sole basis for treatment. Nasal washings and aspirates are unacceptable for Xpert Xpress SARS-CoV-2/FLU/RSV testing.  Fact Sheet for Patients: EntrepreneurPulse.com.au  Fact Sheet for Healthcare Providers: IncredibleEmployment.be  This test is not yet approved or cleared by the [place] FDA and has been authorized for detection and/or diagnosis of SARS-CoV-2 by FDA under an Emergency Use Authorization (EUA). This EUA will remain in effect (meaning this test can be used) for the duration of the COVID-19 declaration under Section 564(b)(1) of the Act, 21 U.S.C. section 360bbb-3(b)(1), unless the authorization is terminated or revoked.     Resp Syncytial Virus by PCR NEGATIVE NEGATIVE Final    Comment: (NOTE) Fact Sheet for Patients: EntrepreneurPulse.com.au  Fact Sheet for Healthcare Providers: IncredibleEmployment.be  This test is not yet approved or cleared by the [place] FDA and has been authorized for detection and/or diagnosis of SARS-CoV-2 by FDA under an Emergency Use  Authorization (EUA). This EUA will remain in effect (meaning this test can be used) for the duration of the COVID-19 declaration under Section 564(b)(1) of the Act, 21 U.S.C. section 360bbb-3(b)(1), unless the authorization is terminated or revoked.  Performed at [facility], [place] [address] [place], [place] [address]   MRSA PCR Screening     Status: None   Collection Time: 02/16/20 12:33 PM   Specimen: Nasal Mucosa; Nasopharyngeal  Result Value Ref Range Status   MRSA by PCR NEGATIVE NEGATIVE Final    Comment:        The GeneXpert MRSA Assay (FDA approved for NASAL specimens only), is one component of a comprehensive MRSA colonization surveillance program. It is not intended to diagnose MRSA infection nor to guide or monitor treatment for MRSA infections. Performed at [facility], [place] [address], [place], [place] [address]      Studies: No results found.  Scheduled Meds: . acetaminophen (TYLENOL) oral liquid 160 mg/5 mL  650 mg Per Tube Q6H  . albuterol  2.5 mg Nebulization BID  . amLODipine  10 mg Per Tube Daily  . baclofen  30 mg Per Tube TID  . chlorhexidine gluconate (MEDLINE KIT)  15 mL Mouth Rinse BID  . Darunavir-Cobicisctat-Emtricitabine-Tenofovir Alafenamide  1 tablet Oral Q breakfast  . famotidine  10.4 mg Per Tube BID  . feeding supplement (OSMOLITE 1.5 CAL)  237 mL Per Tube Q24H  . feeding supplement (OSMOLITE 1.5 CAL)  474 mL Per Tube TID  . feeding supplement (PROSource TF)  45 mL Per Tube TID  . fiber  1 packet Per Tube BID  . folic acid  1 mg Per Tube Daily  . free water  200 mL Per Tube Q4H  . gabapentin  200 mg Per Tube Q8H  . heparin injection (subcutaneous)  5,000 Units Subcutaneous Q8H  . levETIRAcetam  1,500 mg Per Tube BID  . Muscle Rub   Topical QID  . nitrofurantoin  100 mg Per Tube Q12H  . nystatin  5 mL Oral QID  . oxyCODONE  5 mg Per Tube Q8H  . QUEtiapine  12.5 mg Per Tube QHS  . scopolamine  1 patch Transdermal Q72H  .  sulfamethoxazole-trimethoprim  1 tablet Per Tube Once per day on Mon Wed Fri  . thiamine  100 mg Per Tube Daily  . tiZANidine  6 mg  Per Tube Q6H   Continuous Infusions:   Active Problems:   Cardiac arrest [facility])   Community acquired pneumonia of left upper lobe of lung   Anoxic brain injury ([place])   Alcohol abuse   Acute respiratory failure with hypoxia (HCC)   Vomiting   Pressure injury of skin   Small bowel obstruction ([place])   Palliative care encounter   Bowel obstruction (HCC)   Chronic respiratory failure (HCC)   Diastolic dysfunction   Tracheostomy dependent ([place])   Dysphagia   Protein-calorie malnutrition ([place])   Seizures ([place])   Bilateral recurrent inguinal hernia   Muscle spasticity   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HCC)   Tracheobronchitis   Sequela of Corynebacterium infection   Abnormal urinalysis   Urinary tract infection due to Enterococcus   Fever   Consultants:  PCCM  Palliative medicine  General surgery  Neurology  Interventional radiology  Procedures:  EEG  Echocardiogram  Tracheostomy  Antibiotics: Anti-infectives (From admission, onward)   Start     Dose/Rate Route Frequency Ordered Stop   02/17/20 1100  nitrofurantoin (FURADANTIN) 25 MG/5ML suspension 100 mg        100 mg Per Tube Every 12 hours 02/17/20 1006 02/20/20 2159   02/15/20 1000  vancomycin (VANCOCIN) IVPB 1000 mg/200 mL premix  Status:  Discontinued        1,000 mg 200 mL/hr over 60 Minutes Intravenous Every 12 hours 02/14/20 2226 02/16/20 0742   02/14/20 2245  piperacillin-tazobactam (ZOSYN) IVPB 3.375 g  Status:  Discontinued        3.375 g 12.5 mL/hr over 240 Minutes Intravenous Every 8 hours 02/14/20 2226 02/16/20 0742   02/14/20 2245  vancomycin (VANCOREADY) IVPB 1500 mg/300 mL        1,500 mg 150 mL/hr over 120 Minutes Intravenous  Once 02/14/20 2226 02/15/20 0739   02/01/20 2200  amoxicillin-clavulanate (AUGMENTIN) 250-62.5 MG/5ML suspension 500 mg         500 mg Per Tube Every 8 hours 02/01/20 1343 02/06/20 1333   01/31/20 1015  levofloxacin (LEVAQUIN) 25 MG/ML solution 500 mg  Status:  Discontinued        500 mg Per Tube Daily 01/31/20 0919 02/01/20 1343   01/31/20 1000  cefTRIAXone (ROCEPHIN) 1 g in sodium chloride 0.9 % 100 mL IVPB  Status:  Discontinued        1 g 200 mL/hr over 30 Minutes Intravenous Every 24 hours 01/31/20 0905 01/31/20 0919   01/19/20 2200  linezolid (ZYVOX) tablet 600 mg        600 mg Per Tube Every 12 hours 01/19/20 1534 01/25/20 2019   01/19/20 1100  levofloxacin (LEVAQUIN) tablet 500 mg  Status:  Discontinued        500 mg Per Tube Daily 01/19/20 1004 01/19/20 1534   12/19/19 0930  Darunavir-Cobicisctat-Emtricitabine-Tenofovir Alafenamide (SYMTUZA) 800-150-200-10 MG TABS 1 tablet        1 tablet Oral Daily with breakfast 12/19/19 0840     12/13/19 1000  bictegravir-emtricitabine-tenofovir AF (BIKTARVY) 50-200-25 MG per tablet 1 tablet  Status:  Discontinued        1 tablet Oral Daily 12/12/19 1413 12/19/19 0840   11/17/19 1548  ceFAZolin (ANCEF) 2-4 GM/100ML-% IVPB       Note to Pharmacy: Domenick Bookbinder   : cabinet override      11/17/19 1548 11/18/19 0359   11/16/19 1515  ceFAZolin (ANCEF) IVPB 2g/100 mL premix        2 g 200  mL/hr over 30 Minutes Intravenous To Radiology 11/16/19 1511 11/17/19 1625   11/15/19 0900  sulfamethoxazole-trimethoprim (BACTRIM DS) 800-160 MG per tablet 1 tablet        1 tablet Per Tube Once per day on Mon Wed Fri 11/13/19 1906     11/13/19 1615  dolutegravir (TIVICAY) tablet 50 mg  Status:  Discontinued        50 mg Oral Daily 11/13/19 1521 12/12/19 1413   11/13/19 1615  emtricitabine-tenofovir AF (DESCOVY) 200-25 MG per tablet 1 tablet  Status:  Discontinued        1 tablet Per Tube Daily 11/13/19 1521 12/12/19 1413   11/13/19 1530  sulfamethoxazole-trimethoprim (BACTRIM DS) 800-160 MG per tablet 1 tablet  Status:  Discontinued        1 tablet Oral Once per day on Mon Wed Fri  11/13/19 1441 11/13/19 1906   11/13/19 1500  bictegravir-emtricitabine-tenofovir AF (BIKTARVY) 50-200-25 MG per tablet 1 tablet  Status:  Discontinued        1 tablet Oral Daily 11/13/19 1403 11/13/19 1521   11/10/19 1000  erythromycin 250 mg in sodium chloride 0.9 % 100 mL IVPB        250 mg 100 mL/hr over 60 Minutes Intravenous Every 8 hours 11/10/19 0907 11/11/19 2000   11/07/19 1200  ceFEPIme (MAXIPIME) 2 g in sodium chloride 0.9 % 100 mL IVPB        2 g 200 mL/hr over 30 Minutes Intravenous Every 8 hours 11/07/19 1013 11/13/19 2127   11/03/19 1200  cefTRIAXone (ROCEPHIN) 2 g in sodium chloride 0.9 % 100 mL IVPB        2 g 200 mL/hr over 30 Minutes Intravenous Every 24 hours 11/03/19 0922 11/05/19 1427   11/02/19 0800  vancomycin (VANCOREADY) IVPB 750 mg/150 mL  Status:  Discontinued        750 mg 150 mL/hr over 60 Minutes Intravenous Every 12 hours 11/01/19 1935 11/02/19 1105   11/02/19 0600  piperacillin-tazobactam (ZOSYN) IVPB 3.375 g  Status:  Discontinued        3.375 g 12.5 mL/hr over 240 Minutes Intravenous Every 8 hours 11/02/19 0311 11/03/19 0920   11/01/19 2200  ceFEPIme (MAXIPIME) 2 g in sodium chloride 0.9 % 100 mL IVPB  Status:  Discontinued        2 g 200 mL/hr over 30 Minutes Intravenous Every 12 hours 11/01/19 2143 11/02/19 0311   11/01/19 1915  vancomycin (VANCOREADY) IVPB 1500 mg/300 mL        1,500 mg 150 mL/hr over 120 Minutes Intravenous  Once 11/01/19 1909 11/01/19 2147   11/01/19 1845  cefTRIAXone (ROCEPHIN) 1 g in sodium chloride 0.9 % 100 mL IVPB        1 g 200 mL/hr over 30 Minutes Intravenous  Once 11/01/19 1842 11/01/19 2051   11/01/19 1845  azithromycin (ZITHROMAX) 500 mg in sodium chloride 0.9 % 250 mL IVPB        500 mg 250 mL/hr over 60 Minutes Intravenous  Once 11/01/19 1842 11/01/19 2051       Time spent: 20 minutes    Erin Hearing ANP  [facility]  7 am-330 pm/M-F for direct patient care and secure chat Please refer to Amion for  contact information 02/19/2020, 7:52 AM  LOS: 110 days

## 2020-02-19 NOTE — Plan of Care (Signed)
  Problem: Respiratory: Goal: Patent airway maintenance will improve Outcome: Progressing   Problem: Respiratory: Goal: Will regain and/or maintain adequate ventilation Outcome: Progressing   Problem: Nutritional: Goal: Risk of aspiration will decrease Outcome: Progressing   Problem: Nutritional: Goal: Dietary intake will improve Outcome: Progressing

## 2020-02-19 NOTE — Progress Notes (Signed)
Nutrition Follow-up  DOCUMENTATION CODES:   Not applicable  INTERVENTION:   Continue bolus TF regimen via PEG: -Provide 2 cartons (432m) Osmolite 1.5 TID @ 0800, 1200, 1700 -Provide 1 carton (2340m Osmolite 1.5 once daily at bedtime @ 2100 -Flush with 6022mree water before and after each tube feeding bolus -Provide 36m68mosource TF TID -Provide Nutrisource fiber BID -Additional free water flushes currently 200ml43m  Tube feeding regimen provides 2605 kcals, 137 grams protein,1267ml 68m water (2947ml w36mflushes)  NUTRITION DIAGNOSIS:   Increased nutrient needs related to acute illness as evidenced by estimated needs. 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 PMH includes HIV and EtOH use.  09/29 - intubated 10/07- vomiting, TF held, laterrestarted with recurrent vomiting 10/12-R/L inguinal hernia, partial SBO  10/13- trach 10/15-PEG 10/16 - tolerated TC for 9 hours 10/18->10/19 - off vent overnight 10/20- TC 11/15 - trach downsized to #4 Shiley flex CFS 12/06 - trach changed (same #4 Shiley flex CFS) 1/11- trach changed (same #4 Shiley flex CFS)  Pt sleeping at time of RD visit. Pt remains NPO. Tolerating bolus feedings via PEG per RN. Continue with current regimen. Pt pending d/c to SNF.   UOP: 900ml x238murs  Medications: pepcid, nutrisource fiber BID, folic acid, thiamine Labs: Corrected Ca 11.08 (H), CBG 102-118  Diet Order:   Diet Order    None      EDUCATION NEEDS:   Not appropriate for education at this  time  Skin:  Skin Assessment: Skin Integrity Issues: Skin Integrity Issues:: Other (Comment) Stage II: N/A Stage III: N/A Other: puncture abdomen (PEG insertion site)  Last BM:  1/9  Height:   Ht Readings from Last 1 Encounters:  11/01/19 5' 6"  (1.676 m)    Weight:   Wt Readings from Last 1 Encounters:  02/19/20 73 kg    BMI:  Body mass index is 25.98 kg/m.  Estimated Nutritional Needs:   Kcal:  2400-2600  Protein:  120-135 grams  Fluid:  >2L/d  Ryan Orozco ALarkin Orozco, LDN RD pager number and weekend/on-call pager number located in [email or web address]

## 2020-02-19 NOTE — TOC Progression Note (Signed)
Transition of Care [facility]) - Progression Note    Patient Details  Name: Ryan Orozco MRN: [id number] Date of Birth: 1965-04-02  Transition of Care [facility]) CM/SW Contact  Janae Bridgeman, RN Phone Number: 02/19/2020, 3:03 PM  Clinical Narrative:    Case management re-faxed patient out in the hub for SNF placement for [facility] and [facility] facilities with internal comment of patient needing total care including trach and PEG needs.  I placed internal comment to the facilities that [facility] hospital is offering LOG for [facility] placement since the patient has pending Medicaid application and disability benefits pending as well.  Case management will continue to reach out to area SNF facilities that can care for the patient's level of care needed.   Expected Discharge Plan: Skilled Nursing Facility Barriers to Discharge: Continued Medical Work up,SNF Pending bed offer  Expected Discharge Plan and Services Expected Discharge Plan: Skilled Nursing Facility   Discharge Planning Services: CM Consult Post Acute Care Choice: Nursing Home Living arrangements for the past 2 months: Single Family Home                                       Social Determinants of Health (SDOH) Interventions    Readmission Risk Interventions Readmission Risk Prevention Plan 02/06/2020  Transportation Screening Complete  PCP or Specialist Appt within 3-5 Days Complete  HRI or Home Care Consult Complete  Social Work Consult for Recovery Care Planning/Counseling Complete  Palliative Care Screening Complete  Medication Review Oceanographer) Complete  Some recent data might be hidden

## 2020-02-20 LAB — GLUCOSE, CAPILLARY
Glucose-Capillary: 110 mg/dL — ABNORMAL HIGH (ref 70–99)
Glucose-Capillary: 111 mg/dL — ABNORMAL HIGH (ref 70–99)
Glucose-Capillary: 129 mg/dL — ABNORMAL HIGH (ref 70–99)
Glucose-Capillary: 81 mg/dL (ref 70–99)
Glucose-Capillary: 87 mg/dL (ref 70–99)
Glucose-Capillary: 95 mg/dL (ref 70–99)

## 2020-02-20 LAB — CULTURE, BLOOD (ROUTINE X 2)
Culture: NO GROWTH
Culture: NO GROWTH
Special Requests: ADEQUATE
Special Requests: ADEQUATE

## 2020-02-20 NOTE — Progress Notes (Signed)
Physical Therapy Treatment/ Discharge Patient Details Name: Ryan Orozco MRN: [id number] DOB: 06-23-1965 Today's Date: 02/20/2020    History of Present Illness 55 year old male found down outside convenience store 9/27. Pt with PEA and hypoxic brain injury. Trach 10/13, PEG 10/15. PMHx: HIV AIDS and alcohol abuse.  CIR physician consulted for spacticity management and s/Orozco botox injections 02/08/20. COVID (-) 02/15/19.    PT Comments    Pt remains only able to voice "Hey" and limited by flexor tone all extremities. Able to achieve increased bil LE extension today in supine with overpressure but unable to maintain and pt returns to fetal position as soon as pressure removed. Pt without progression toward acute goals and limited by flexor tone and anoxic brain injury. Will defer ROM and positioning to nursing staff and will sign off for acute therapy at this time.    Follow Up Recommendations  SNF     Equipment Recommendations  Hospital bed;Other (comment) (hoyer lift)    Recommendations for Other Services       Precautions / Restrictions Precautions Precautions: Fall Precaution Comments: trach, PEG, anoxic, flexor tone R>L LE/UE Other Brace: bilateral resting hand splints Restrictions Weight Bearing Restrictions: No    Mobility  Bed Mobility Overal bed mobility: Needs Assistance Bed Mobility: Rolling;Sit to Supine;Supine to Sit Rolling: Total assist   Supine to sit: Total assist;+2 for physical assistance Sit to supine: Total assist;+2 for physical assistance   General bed mobility comments: total assist to roll bil, total +2 to transition to sitting EOB and back with bed in CPR with mattress fully deflated. Pt with maintained hip/knee/trunk flexion in sitting today and unable to break tone in sitting. Pt without command following for balance or trunk positioning in sitting and returned to supine with total assist for sitting balance grossly 5 min  Transfers                     Ambulation/Gait                 Stairs             Wheelchair Mobility    Modified Rankin (Stroke Patients Only)       Balance                                            Cognition Arousal/Alertness: Awake/alert Behavior During Therapy: Restless Overall Cognitive Status: Impaired/Different from baseline Area of Impairment: Following commands;Problem solving               Rancho Levels of Cognitive Functioning Ranch[place] Amigos Scales of Cognitive Functioning: Localized response   Current Attention Level: Focused           General Comments: pt verbal moaning, stating hey and restless particularly with trunk and LUE. Pt not following any commands today for positioning, moving or balance      Exercises Other Exercises Other Exercises: passive stretch to left elbow extension, left wrist and finger extension. Other Exercises: passive stretch to left hip and knee able to achieve full extension both minus grossly 10 degrees knee flexion Other Exercises: passive stretch RLE with achieving hip flexion to grossly 10 degrees and grossly 10 degrees knee flexion    General Comments        Pertinent Vitals/Pain Pain Score: 5  Pain Location: grimace with positioning and stretching Pain Descriptors /  Indicators: Grimacing;Guarding Pain Intervention(s): Limited activity within patient's tolerance;Repositioned    Home Living                      Prior Function            PT Goals (current goals can now be found in the care plan section) Progress towards PT goals: Not progressing toward goals - comment    Frequency           PT Plan Current plan remains appropriate;Other (comment) (pt without significant progression toward goals in a reasonable time frame. PT remains limited by all extremity flexor tone and anoxic brain injury)    Co-evaluation              AM-PAC PT "6 Clicks" Mobility   Outcome  Measure  Help needed turning from your back to your side while in a flat bed without using bedrails?: Total Help needed moving from lying on your back to sitting on the side of a flat bed without using bedrails?: Total Help needed moving to and from a bed to a chair (including a wheelchair)?: Total Help needed standing up from a chair using your arms (e.g., wheelchair or bedside chair)?: Total Help needed to walk in hospital room?: Total Help needed climbing 3-5 steps with a railing? : Total 6 Click Score: 6    End of Session Equipment Utilized During Treatment: Oxygen Activity Tolerance: Patient tolerated treatment well Patient left: in bed;with call bell/phone within reach;with bed alarm set Nurse Communication: Mobility status PT Visit Diagnosis: Other abnormalities of gait and mobility (R26.89);Muscle weakness (generalized) (M62.81)     Time: 3474-2595 PT Time Calculation (min) (ACUTE ONLY): 26 min  Charges:  $Therapeutic Activity: 23-37 mins                     Ryan Orozco, PT Acute Rehabilitation Services Pager: [phone] Office: [phone]    Ryan Orozco 02/20/2020, 9:57 AM

## 2020-02-20 NOTE — Progress Notes (Signed)
[facility] PROGRESS NOTE  Nasif [place] HQI:[id number] DOB: 23-Feb-1965 DOA: 11/01/2019 PCP: Default, Provider, MD    11/16   Status: Remains inpatient appropriate because:Altered mental status, Unsafe d/c plan and Inpatient level of care appropriate due to severity of illness. Patient newly diagnosed with anoxic brain injury  Dispo: The patient is from: Home              Anticipated d/c is to: SNF              Anticipated d/c date is: > 3 days              Patient currently is medically stable to d/c.  Barriers to discharge: No SNF bed offers.  Needs trach capable facility. TOC communicating with financial counseling team who have yet to hear back from DSS regarding patient's assigned caseworker.  TOC spoke with [facility] rehab.  Patient would be an excellent candidate but they request he have active Medicaid and disability before they can accept him.  Also faxed out information to [facility] as of 1/10   Code Status: Full Family Communication: 1/14 dtr [name] updated DVT prophylaxis: Subcutaneous heparin Vaccination status: Will order first dose Pfizer COVID-vaccine on 1/10; next week we will order pneumococcal and influenza vaccinations  Foley catheter:  No  HPI: 55 year old male with HIV, chronic alcohol abuse who was presented to the emergency department after being found unresponsive outside a convinient store.  EMS found him pulseless in PEA, unknown downtime, successfully resuscitated and brought to the emergency department.  UDS positive for THC and benzodiazepines.  CT head unremarkable.  Failed extubation trials due to severe anoxic brain injury and had tracheostomy placed.  PEG placed on 10/15.  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  This has led to significant pain as well.  [name] was consulted and recommendations/input/orders highly appreciated.  She may also benefit  from Botox injections to both hips to aid in treatment of spasticity.   Significant Hospital Events   9/29 Admit post PEA arrest. UDS positive for THC, benzo's. ETOH 177 10/05 EEG ongoing. Versed restarted overnight due to agitation. Nicardipine increased.  10/06 Developed tachypnea with WUA, no follow commands off sedation  10/07 Vomiting, TF held / restarted with recurrent vomiting 10/16 tracheostomy collar for 9 hours 10/18->10/19 off vent all night  10/20> TC 11/15 > downsized to #4 Shiley flex CFS  Subjective: Awakened.  No acute distress.  As per his usual once he is engaged he states "hey"  Objective: Vitals:   02/19/20 2258 02/20/20 0408  BP:    Pulse: 89 100  Resp: 18 20  Temp:    SpO2: 98% 98%    Intake/Output Summary (Last 24 hours) at 02/20/2020 0650 Last data filed at 02/20/2020 0310 Gross per 24 hour  Intake -  Output 1700 ml  Net -1700 ml   Filed Weights   02/13/20 0750 02/18/20 0500 02/19/20 0420  Weight: 70.5 kg 75.5 kg 73 kg    Exam: General: Awakens, alert when awake.  No acute distress. Pulmonary: Bilateral lung sounds clear to auscultation anteriorly.  Trach unremarkable without any excessive use.  FiO2 21% via trach collar with pulse oximetry 96 to 98% Cardiac: Normal heart sounds S1-S2, no tachycardia, pulse regular, extremities warm with adequate capillary refill Abdomen: PEG   LBM 1/13 Neurological: Awake interactive today. Continues with hypertonicity worse in the lower extremities involving the hip flexors and hamstrings; continues  to have very limited extension of RLE and attempts to extend past 30 to 45 degrees pain response from patient- limited use of upper extremities with action primarily initiated from the shoulder girth.  Assessment/Plan: Acute problems: Anoxic brain injury 2/2 PEA arrest/pain syndrome secondary to hypertonicity/spastic tetraplegia:  -Presumed 2/2 combination alcohol and BZD overdose with UDS positive for THC and  BZDs -Significant brain injury with underlying spastic tetraplegia long-term SNF placement recommended-continue PT/OT/SLP -Continue baclofen, gabapentin, scheduled oxycodone and Zanaflex  -Appreciate assistance of Dr. Kathleene Hazel dose has been uptitrated to 6 mg every 6 hours -1/6 Botox injection into the quadriceps and rectus femoris as well as the biceps femoris and hamstrings, semimembranosus and semitendinosus.  Unable to inject iliopsoas without EMG guidance. -Continue bilateral upper extremity WHO resting splints along with PROM per nursing staff every 4 hours -Continue low-dose Seroquel for brain injury sequela -Continue scheduled Tylenol for pain along with Crea muscle rub -Continue KREG rotational bed   Chronic respiratory failure with inability to maintain patent airway requiring chronic tracheostomy tube/Recent Corynebacterium tracheobronchitis (resolved) -See above regarding fever work-up -Continue PMV training per SLP-#4 cuffless trach in place-trach changed out on 1/10 RT -Not a candidate for decannulation until patient can reliably follow commands, cough and clear airway and it appears current mentation will likely be patient's new baseline -continue physiotherapy with Vibra vest along with supervised PMV trials  HIV:  -CD4 count of 124 October 2021 (had been as high as 250 Dec 2020)-as of today up to 259 -Emesis recommends repeating viral load which has been ordered on 1/14 -Dc'd Tivicay and Descovy infavor of Biktarvy to for eventual dc to SNF; per my discussion with infectious disease physician [name] on 12/23 preferred regimen would be Descovy and Tivicay-she will contact the HIV pharmacist to attempt to obtain discount cards to make this a more affordable option.  Once this has been confirmed will transition back to Descovy and Tivicay -CD4 count on 12/23 up to 259 -ID recommends continuing prophylactic Bactrim an additional 3 months  Dysphagia 2/2 anoxic brain  injury -Continue tube feeding per PEG -SLP attempted to introduce oral substances on 12/27 but patient did not accept and was pushing out with his tongue.  Given his acute UTI he may have been nauseated and this may have been reason for him not wishing to eat.  Protein calorie malnutrition nutrition Status: Nutrition Problem: Increased nutrient needs Etiology: acute illness Signs/Symptoms: estimated needs Interventions: Tube feeding bolus doses of Osmolite, Juven twice daily, Prosource TF 45 mL 3 times daily Estimated body mass index is 25.98 kg/m as calculated from the following:   Height as of this encounter: 5' 6"  (1.676 m).   Weight as of this encounter: 73 kg.   Multiple decubiti not POA Wound / Incision (Open or Dehisced) 11/17/19 Puncture Abdomen Left;Anterior;Upper G-tube insertion site  (Active)  Date First Assessed/Time First Assessed: 11/17/19 1646   Wound Type: Puncture  Location: Abdomen  Location Orientation: Left;Anterior;Upper  Wound Description (Comments): G-tube insertion site   Present on Admission: No    Assessments 11/17/2019  4:47 PM 02/19/2020 10:44 AM  Dressing Type Gauze (Comment);Tape dressing Gauze (Comment)  Dressing Changed New -  Dressing Status Clean;Dry;Intact Clean;Dry;Intact  Dressing Change Frequency PRN -  Site / Wound Assessment Clean;Dry;Pink Clean;Dry  Peri-wound Assessment Intact -  Closure None -  Drainage Amount None None  Treatment Cleansed -     No Linked orders to display      Other problems: Hypertension/grade 1  diastolic dysfunction:  -Continue amlodipine  -Echocardiogram this admission with preserved LVEF with evidence of grade   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   -Continue Keppra; levetiracetam level 35.9 on 11/12 -no further seizure activity since transitioned out of ICU setting therefore will discontinue seizure precaution  SIRS/Fever 2/2 recurrent enterococcus UTI -Patient developed mild elevation  in temperature overnight to 1/13 associated with slight increase in respiratory secretions and congested sounding cough/abnormal pulmonary exam as well as tachycardia./14 respiratory status much improved and no further fevers. -Respiratory viral panel neg for COVID, influenza and RSV.   -UA slightly abnormal/cx c/w enterococcus UTI- started on nitrofurantoin (prior UTI treated w/ augmentin) has completed 3 days of treatment -X-ray with bilateral opacities concerning for early pneumonia but CT of the chest without pneumonia, broad-spectrum antibiotics dc'd 1/14 -Because of dehydration increased frequency of free water from 200 cc every 4 hours to every 2 hours on 1/13- stopped on 1/15  Inguinal hernias -Evaluated by general surgery this admission. Documented as chronically incarcerated. Patient has been clinically stable and tolerating tube feedings and having bowel movements so unless patient obstructed would not be considered a candidate for repair. Even if obstructed consulting surgeon was not sure he would be a candidate for hernia repair regardless  Goals of care:  -Palliative care was involved during this hospitalization.   -Goals of care were discussed. remains full code.  -Palliative care recommended outpatient follow-up with palliative providers. -May need to revisit end-of-life goals of care since patient making poor progress and currently having significant pain related to hypertonicity from brain injury   Data Reviewed: Basic Metabolic Panel: Recent Labs  Lab 02/15/20 0433 02/16/20 0537 02/18/20 0400  NA  --  144 143  K  --  3.7 3.9  CL  --  106 102  CO2  --  23 26  GLUCOSE  --  109* 98  BUN  --  18 19  CREATININE 0.77 0.64 0.69  CALCIUM  --  10.2 10.6*  MG  --   --  1.8  PHOS  --   --  4.0   Liver Function Tests: Recent Labs  Lab 02/16/20 0537 02/18/20 0400  AST 24 24  ALT 17 16  ALKPHOS 89 107  BILITOT 0.5 0.5  PROT 7.1 7.8  ALBUMIN 3.1* 3.4*   No results  for input(s): LIPASE, AMYLASE in the last 168 hours. No results for input(s): AMMONIA in the last 168 hours. CBC: Recent Labs  Lab 02/14/20 2242 02/16/20 0537 02/18/20 0400  WBC 5.8 4.3 3.8*  NEUTROABS  --   --  2.3  HGB 14.2 15.9 14.4  HCT 43.0 47.8 44.2  MCV 95.1 94.8 93.6  PLT 174 131* 132*   Cardiac Enzymes: No results for input(s): CKTOTAL, CKMB, CKMBINDEX, TROPONINI in the last 168 hours. BNP (last 3 results) Recent Labs    02/15/20 0433  BNP 3.9    ProBNP (last 3 results) No results for input(s): PROBNP in the last 8760 hours.  CBG: Recent Labs  Lab 02/19/20 1138 02/19/20 1637 02/19/20 1945 02/19/20 2352 02/20/20 0353  GLUCAP 138* 124* 126* 141* 110*    Recent Results (from the past 240 hour(s))  Culture, Urine     Status: Abnormal   Collection Time: 02/14/20  4:38 AM   Specimen: Urine, Random  Result Value Ref Range Status   Specimen Description URINE, RANDOM  Final   Special Requests   Final    NONE Performed at [facility], [place] [address], [place], [place] [address]    Culture >=100,000 COLONIES/mL ENTEROCOCCUS FAECALIS (A)  Final   Report Status 02/17/2020 FINAL  Final   Organism ID, Bacteria ENTEROCOCCUS FAECALIS (A)  Final      Susceptibility   Enterococcus faecalis - MIC*    AMPICILLIN <=2 SENSITIVE Sensitive     NITROFURANTOIN <=16 SENSITIVE Sensitive     VANCOMYCIN 1 SENSITIVE Sensitive     * >=100,000 COLONIES/mL ENTEROCOCCUS FAECALIS  Culture, blood (routine x 2)     Status: None (Preliminary result)   Collection Time: 02/14/20 10:42 PM   Specimen: BLOOD RIGHT ARM  Result Value Ref Range Status   Specimen Description BLOOD RIGHT ARM  Final   Special Requests   Final    BOTTLES DRAWN AEROBIC AND ANAEROBIC Blood Culture adequate volume   Culture   Final    NO GROWTH 4 DAYS Performed at [facility], [address], [place], [place] [address]    Report Status PENDING  Incomplete  Culture, blood (routine x 2)     Status:  None (Preliminary result)   Collection Time: 02/14/20 10:42 PM   Specimen: BLOOD RIGHT HAND  Result Value Ref Range Status   Specimen Description BLOOD RIGHT HAND  Final   Special Requests   Final    BOTTLES DRAWN AEROBIC AND ANAEROBIC Blood Culture adequate volume   Culture   Final    NO GROWTH 4 DAYS Performed at [facility], [place] [address], [place],  [address]    Report Status PENDING  Incomplete  Resp panel by RT-PCR (RSV, Flu A&B, Covid) Nasopharyngeal Swab     Status: None   Collection Time: 02/16/20  9:39 AM   Specimen: Nasopharyngeal Swab; Nasopharyngeal(NP) swabs in vial transport medium  Result Value Ref Range Status   SARS Coronavirus 2 by RT PCR NEGATIVE NEGATIVE Final    Comment: (NOTE) SARS-CoV-2 target nucleic acids are NOT DETECTED.  The SARS-CoV-2 RNA is generally detectable in upper respiratory specimens during the acute phase of infection. The lowest concentration of SARS-CoV-2 viral copies this assay can detect is 138 copies/mL. A negative result does not preclude SARS-Cov-2 infection and should not be used as the sole basis for treatment or other patient management decisions. A negative result may occur with  improper specimen collection/handling, submission of specimen other than nasopharyngeal swab, presence of viral mutation(s) within the areas targeted by this assay, and inadequate number of viral copies(<138 copies/mL). A negative result must be combined with clinical observations, patient history, and epidemiological information. The expected result is Negative.  Fact Sheet for Patients:  EntrepreneurPulse.com.au  Fact Sheet for Healthcare Providers:  IncredibleEmployment.be  This test is no t yet approved or cleared by the [place] FDA and  has been authorized for detection and/or diagnosis of SARS-CoV-2 by FDA under an Emergency Use Authorization (EUA). This EUA will remain  in effect (meaning  this test can be used) for the duration of the COVID-19 declaration under Section 564(b)(1) of the Act, 21 U.S.C.section 360bbb-3(b)(1), unless the authorization is terminated  or revoked sooner.       Influenza A by PCR NEGATIVE NEGATIVE Final   Influenza B by PCR NEGATIVE NEGATIVE Final    Comment: (NOTE) The Xpert Xpress SARS-CoV-2/FLU/RSV plus assay is intended as an aid in the diagnosis of influenza from Nasopharyngeal swab specimens and should not be used as a sole basis for treatment. Nasal washings and aspirates are unacceptable  for Xpert Xpress SARS-CoV-2/FLU/RSV testing.  Fact Sheet for Patients: EntrepreneurPulse.com.au  Fact Sheet for Healthcare Providers: IncredibleEmployment.be  This test is not yet approved or cleared by the [place] FDA and has been authorized for detection and/or diagnosis of SARS-CoV-2 by FDA under an Emergency Use Authorization (EUA). This EUA will remain in effect (meaning this test can be used) for the duration of the COVID-19 declaration under Section 564(b)(1) of the Act, 21 U.S.C. section 360bbb-3(b)(1), unless the authorization is terminated or revoked.     Resp Syncytial Virus by PCR NEGATIVE NEGATIVE Final    Comment: (NOTE) Fact Sheet for Patients: EntrepreneurPulse.com.au  Fact Sheet for Healthcare Providers: IncredibleEmployment.be  This test is not yet approved or cleared by the [place] FDA and has been authorized for detection and/or diagnosis of SARS-CoV-2 by FDA under an Emergency Use Authorization (EUA). This EUA will remain in effect (meaning this test can be used) for the duration of the COVID-19 declaration under Section 564(b)(1) of the Act, 21 U.S.C. section 360bbb-3(b)(1), unless the authorization is terminated or revoked.  Performed at [facility], [place] [address], [place], [place] [address]   MRSA PCR Screening     Status:  None   Collection Time: 02/16/20 12:33 PM   Specimen: Nasal Mucosa; Nasopharyngeal  Result Value Ref Range Status   MRSA by PCR NEGATIVE NEGATIVE Final    Comment:        The GeneXpert MRSA Assay (FDA approved for NASAL specimens only), is one component of a comprehensive MRSA colonization surveillance program. It is not intended to diagnose MRSA infection nor to guide or monitor treatment for MRSA infections. Performed at [facility], [place] [address], [place], [place] [address]      Studies: No results found.  Scheduled Meds: . acetaminophen (TYLENOL) oral liquid 160 mg/5 mL  650 mg Per Tube Q6H  . amLODipine  10 mg Per Tube Daily  . baclofen  30 mg Per Tube TID  . chlorhexidine gluconate (MEDLINE KIT)  15 mL Mouth Rinse BID  . Darunavir-Cobicisctat-Emtricitabine-Tenofovir Alafenamide  1 tablet Oral Q breakfast  . famotidine  10.4 mg Per Tube BID  . feeding supplement (OSMOLITE 1.5 CAL)  237 mL Per Tube Q24H  . feeding supplement (OSMOLITE 1.5 CAL)  474 mL Per Tube TID  . feeding supplement (PROSource TF)  45 mL Per Tube TID  . fiber  1 packet Per Tube BID  . folic acid  1 mg Per Tube Daily  . free water  200 mL Per Tube Q4H  . gabapentin  200 mg Per Tube Q8H  . heparin injection (subcutaneous)  5,000 Units Subcutaneous Q8H  . levETIRAcetam  1,500 mg Per Tube BID  . Muscle Rub   Topical QID  . nitrofurantoin  100 mg Per Tube Q12H  . nystatin  5 mL Oral QID  . oxyCODONE  5 mg Per Tube Q8H  . QUEtiapine  12.5 mg Per Tube QHS  . scopolamine  1 patch Transdermal Q72H  . sulfamethoxazole-trimethoprim  1 tablet Per Tube Once per day on Mon Wed Fri  . thiamine  100 mg Per Tube Daily  . tiZANidine  6 mg Per Tube Q6H   Continuous Infusions:   Active Problems:   Cardiac arrest [facility])   Community acquired pneumonia of left upper lobe of lung   Anoxic brain injury ([place])   Alcohol abuse   Acute respiratory failure with hypoxia (HCC)   Vomiting   Pressure injury  of  skin   Small bowel obstruction ([place])   Palliative care encounter   Bowel obstruction (HCC)   Chronic respiratory failure (HCC)   Diastolic dysfunction   Tracheostomy dependent ([place])   Dysphagia   Protein-calorie malnutrition ([place])   Seizures ([place])   Bilateral recurrent inguinal hernia   Muscle spasticity   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HCC)   Tracheobronchitis   Sequela of Corynebacterium infection   Abnormal urinalysis   Urinary tract infection due to Enterococcus   Fever   Consultants:  PCCM  Palliative medicine  General surgery  Neurology  Interventional radiology  Procedures:  EEG  Echocardiogram  Tracheostomy  Antibiotics: Anti-infectives (From admission, onward)   Start     Dose/Rate Route Frequency Ordered Stop   02/17/20 1100  nitrofurantoin (FURADANTIN) 25 MG/5ML suspension 100 mg        100 mg Per Tube Every 12 hours 02/17/20 1006 02/20/20 2159   02/15/20 1000  vancomycin (VANCOCIN) IVPB 1000 mg/200 mL premix  Status:  Discontinued        1,000 mg 200 mL/hr over 60 Minutes Intravenous Every 12 hours 02/14/20 2226 02/16/20 0742   02/14/20 2245  piperacillin-tazobactam (ZOSYN) IVPB 3.375 g  Status:  Discontinued        3.375 g 12.5 mL/hr over 240 Minutes Intravenous Every 8 hours 02/14/20 2226 02/16/20 0742   02/14/20 2245  vancomycin (VANCOREADY) IVPB 1500 mg/300 mL        1,500 mg 150 mL/hr over 120 Minutes Intravenous  Once 02/14/20 2226 02/15/20 0739   02/01/20 2200  amoxicillin-clavulanate (AUGMENTIN) 250-62.5 MG/5ML suspension 500 mg        500 mg Per Tube Every 8 hours 02/01/20 1343 02/06/20 1333   01/31/20 1015  levofloxacin (LEVAQUIN) 25 MG/ML solution 500 mg  Status:  Discontinued        500 mg Per Tube Daily 01/31/20 0919 02/01/20 1343   01/31/20 1000  cefTRIAXone (ROCEPHIN) 1 g in sodium chloride 0.9 % 100 mL IVPB  Status:  Discontinued        1 g 200 mL/hr over 30 Minutes Intravenous Every 24 hours 01/31/20 0905 01/31/20  0919   01/19/20 2200  linezolid (ZYVOX) tablet 600 mg        600 mg Per Tube Every 12 hours 01/19/20 1534 01/25/20 2019   01/19/20 1100  levofloxacin (LEVAQUIN) tablet 500 mg  Status:  Discontinued        500 mg Per Tube Daily 01/19/20 1004 01/19/20 1534   12/19/19 0930  Darunavir-Cobicisctat-Emtricitabine-Tenofovir Alafenamide (SYMTUZA) 800-150-200-10 MG TABS 1 tablet        1 tablet Oral Daily with breakfast 12/19/19 0840     12/13/19 1000  bictegravir-emtricitabine-tenofovir AF (BIKTARVY) 50-200-25 MG per tablet 1 tablet  Status:  Discontinued        1 tablet Oral Daily 12/12/19 1413 12/19/19 0840   11/17/19 1548  ceFAZolin (ANCEF) 2-4 GM/100ML-% IVPB       Note to Pharmacy: Domenick Bookbinder   : cabinet override      11/17/19 1548 11/18/19 0359   11/16/19 1515  ceFAZolin (ANCEF) IVPB 2g/100 mL premix        2 g 200 mL/hr over 30 Minutes Intravenous To Radiology 11/16/19 1511 11/17/19 1625   11/15/19 0900  sulfamethoxazole-trimethoprim (BACTRIM DS) 800-160 MG per tablet 1 tablet        1 tablet Per Tube Once per day on Mon Wed Fri 11/13/19 1906     11/13/19 1615  dolutegravir (TIVICAY) tablet 50 mg  Status:  Discontinued        50 mg Oral Daily 11/13/19 1521 12/12/19 1413   11/13/19 1615  emtricitabine-tenofovir AF (DESCOVY) 200-25 MG per tablet 1 tablet  Status:  Discontinued        1 tablet Per Tube Daily 11/13/19 1521 12/12/19 1413   11/13/19 1530  sulfamethoxazole-trimethoprim (BACTRIM DS) 800-160 MG per tablet 1 tablet  Status:  Discontinued        1 tablet Oral Once per day on Mon Wed Fri 11/13/19 1441 11/13/19 1906   11/13/19 1500  bictegravir-emtricitabine-tenofovir AF (BIKTARVY) 50-200-25 MG per tablet 1 tablet  Status:  Discontinued        1 tablet Oral Daily 11/13/19 1403 11/13/19 1521   11/10/19 1000  erythromycin 250 mg in sodium chloride 0.9 % 100 mL IVPB        250 mg 100 mL/hr over 60 Minutes Intravenous Every 8 hours 11/10/19 0907 11/11/19 2000   11/07/19 1200  ceFEPIme  (MAXIPIME) 2 g in sodium chloride 0.9 % 100 mL IVPB        2 g 200 mL/hr over 30 Minutes Intravenous Every 8 hours 11/07/19 1013 11/13/19 2127   11/03/19 1200  cefTRIAXone (ROCEPHIN) 2 g in sodium chloride 0.9 % 100 mL IVPB        2 g 200 mL/hr over 30 Minutes Intravenous Every 24 hours 11/03/19 0922 11/05/19 1427   11/02/19 0800  vancomycin (VANCOREADY) IVPB 750 mg/150 mL  Status:  Discontinued        750 mg 150 mL/hr over 60 Minutes Intravenous Every 12 hours 11/01/19 1935 11/02/19 1105   11/02/19 0600  piperacillin-tazobactam (ZOSYN) IVPB 3.375 g  Status:  Discontinued        3.375 g 12.5 mL/hr over 240 Minutes Intravenous Every 8 hours 11/02/19 0311 11/03/19 0920   11/01/19 2200  ceFEPIme (MAXIPIME) 2 g in sodium chloride 0.9 % 100 mL IVPB  Status:  Discontinued        2 g 200 mL/hr over 30 Minutes Intravenous Every 12 hours 11/01/19 2143 11/02/19 0311   11/01/19 1915  vancomycin (VANCOREADY) IVPB 1500 mg/300 mL        1,500 mg 150 mL/hr over 120 Minutes Intravenous  Once 11/01/19 1909 11/01/19 2147   11/01/19 1845  cefTRIAXone (ROCEPHIN) 1 g in sodium chloride 0.9 % 100 mL IVPB        1 g 200 mL/hr over 30 Minutes Intravenous  Once 11/01/19 1842 11/01/19 2051   11/01/19 1845  azithromycin (ZITHROMAX) 500 mg in sodium chloride 0.9 % 250 mL IVPB        500 mg 250 mL/hr over 60 Minutes Intravenous  Once 11/01/19 1842 11/01/19 2051       Time spent: 20 minutes    Erin Hearing ANP  [facility]  7 am-330 pm/M-F for direct patient care and secure chat Please refer to Amion for contact information 02/20/2020, 6:50 AM  LOS: 111 days

## 2020-02-20 NOTE — Plan of Care (Signed)
  Problem: Respiratory: Goal: Will regain and/or maintain adequate ventilation Outcome: Progressing   Problem: Skin Integrity: Goal: Risk for impaired skin integrity will decrease Outcome: Progressing   Problem: Skin Integrity: Goal: Demonstration of wound healing without infection will improve Outcome: Progressing   Problem: Nutritional: Goal: Risk of aspiration will decrease Outcome: Progressing   Problem: Nutritional: Goal: Dietary intake will improve Outcome: Progressing

## 2020-02-21 LAB — GLUCOSE, CAPILLARY
Glucose-Capillary: 92 mg/dL (ref 70–99)
Glucose-Capillary: 84 mg/dL (ref 70–99)
Glucose-Capillary: 100 mg/dL — ABNORMAL HIGH (ref 70–99)
Glucose-Capillary: 84 mg/dL (ref 70–99)
Glucose-Capillary: 89 mg/dL (ref 70–99)

## 2020-02-21 NOTE — Progress Notes (Signed)
[facility] PROGRESS NOTE  Ryan Orozco XBM:[id number] DOB: 05-04-1965 DOA: 11/01/2019 PCP: Default, Provider, MD    11/16   Status: Remains inpatient appropriate because:Altered mental status, Unsafe d/c plan and Inpatient level of care appropriate due to severity of illness. Patient newly diagnosed with anoxic brain injury  Dispo: The patient is from: Home              Anticipated d/c is to: SNF              Anticipated d/c date is: > 3 days              Patient currently is medically stable to d/c.  Barriers to discharge: No SNF bed offers.  Needs trach capable facility. TOC communicating with financial counseling team who have yet to hear back from DSS regarding patient's assigned caseworker.  TOC spoke with [facility] rehab.  Patient would be an excellent candidate but they request he have active Medicaid and disability before they can accept him.  Also faxed out information to [facility] as of 1/10   Code Status: Full Family Communication: 1/14 dtr Ryan Orozco updated DVT prophylaxis: Subcutaneous heparin Vaccination status: Will order first dose Pfizer COVID-vaccine on 1/10; next week we will order pneumococcal and influenza vaccinations  Foley catheter:  No  HPI: 55 year old male with HIV, chronic alcohol abuse who was presented to the emergency department after being found unresponsive outside a convinient store.  EMS found him pulseless in PEA, unknown downtime, successfully resuscitated and brought to the emergency department.  UDS positive for THC and benzodiazepines.  CT head unremarkable.  Failed extubation trials due to severe anoxic brain injury and had tracheostomy placed.  PEG placed on 10/15.  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  This has led to significant pain as well.  [name] was consulted and recommendations/input/orders highly appreciated.  She may also benefit  from Botox injections to both hips to aid in treatment of spasticity.   Significant Hospital Events   9/29 Admit post PEA arrest. UDS positive for THC, benzo's. ETOH 177 10/05 EEG ongoing. Versed restarted overnight due to agitation. Nicardipine increased.  10/06 Developed tachypnea with WUA, no follow commands off sedation  10/07 Vomiting, TF held / restarted with recurrent vomiting 10/16 tracheostomy collar for 9 hours 10/18->10/19 off vent all night  10/20> TC 11/15 > downsized to #4 Shiley flex CFS  Subjective: Patient awake.  Began to smile when I interacted with him.  While discussing patient's persistent leg flexion at hips remarked to him that I was hopeful that the Botox injection would help him not look like a frog with his legs.  He smiled broadly and began laughing.  Objective: Vitals:   02/21/20 0330 02/21/20 0419  BP:  108/77  Pulse: 73 80  Resp: 14 17  Temp:  97.9 F (36.6 C)  SpO2: 97% 98%    Intake/Output Summary (Last 24 hours) at 02/21/2020 0758 Last data filed at 02/20/2020 1200 Gross per 24 hour  Intake -  Output 650 ml  Net -650 ml   Filed Weights   02/13/20 0750 02/18/20 0500 02/19/20 0420  Weight: 70.5 kg 75.5 kg 73 kg    Exam: General: Alert and awake.  No acute distress Pulmonary: Lungs are clear to auscultation, no secretions from trach.  FiO2 21% via trach collar  Cardiac: Normal heart sounds, pulse regular, extremities warm to touch with adequate capillary refill Abdomen: PEG  in place for medications and tube feedings.  Abdomen soft nontender nondistended.   LBM 1/13 Neurological: Awake interactive today. Continues with hypertonicity worse in the lower extremities involving the hip flexors and hamstrings; continues to have very limited extension of RLE and attempts to extend past 30 to 45 degrees pain response from patient- limited use of upper extremities with action primarily initiated from the shoulder girth. Psychiatric: Patient awakens.   Has limited ability to verbally communicate primarily limited to words such as "hey".  He does appear to understand what we are saying to him.  He will watch TV as well.  Assessment/Plan: Acute problems: Anoxic brain injury 2/2 PEA arrest/pain syndrome secondary to hypertonicity/spastic tetraplegia:  -Presumed 2/2 combination alcohol and BZD overdose with UDS positive for THC and BZDs -Significant brain injury with underlying spastic tetraplegia long-term SNF placement recommended-continue PT/OT/SLP -Continue baclofen, gabapentin, scheduled oxycodone and Zanaflex  -Appreciate assistance of [name] -1/6 Botox injection into the quadriceps and rectus femoris as well as the biceps femoris and hamstrings, semimembranosus and semitendinosus.  Unable to inject iliopsoas without EMG guidance. -Continue bilateral upper extremity WHO resting splints along with PROM per nursing staff every 4 hours -Continue low-dose Seroquel for brain injury sequela -Continue scheduled Tylenol for pain along with Crea muscle rub -Continue KREG rotational bed   Chronic respiratory failure with inability to maintain patent airway requiring chronic tracheostomy tube/Recent Corynebacterium tracheobronchitis (resolved) -Continue PMV training per SLP-#4 cuffless trach in place-trach changed out on 1/10 RT -Not a candidate for decannulation until patient can reliably follow commands, cough and clear airway and it appears current mentation will likely be patient's new baseline -continue physiotherapy with Vibra vest along with supervised PMV trials  HIV:  -CD4 count of 124 October 2021 (had been as high as 250 Dec 2020)-as of today up to 259 -Emesis recommends repeating viral load which has been ordered on 1/14 -Dc'd Tivicay and Descovy infavor of Biktarvy to for eventual dc to SNF; per my discussion with infectious disease physician [name] on 12/23 preferred regimen would be Descovy and Tivicay-she will contact the HIV  pharmacist to attempt to obtain discount cards to make this a more affordable option.  Once this has been confirmed will transition back to Descovy and Tivicay -CD4 count on 12/23 up to 259 -ID recommends continuing prophylactic Bactrim an additional 3 months  Dysphagia 2/2 anoxic brain injury -Continue tube feeding per PEG -SLP attempted to introduce oral substances on 12/27 but patient did not accept and was pushing out with his tongue.  Given his acute UTI he may have been nauseated and this may have been reason for him not wishing to eat.  Protein calorie malnutrition nutrition Status: Nutrition Problem: Increased nutrient needs Etiology: acute illness Signs/Symptoms: estimated needs Interventions: Tube feeding bolus doses of Osmolite, Juven twice daily, Prosource TF 45 mL 3 times daily Estimated body mass index is 25.98 kg/m as calculated from the following:   Height as of this encounter: 5' 6" (1.676 m).   Weight as of this encounter: 73 kg.   Multiple decubiti not POA Wound / Incision (Open or Dehisced) 11/17/19 Puncture Abdomen Left;Anterior;Upper G-tube insertion site  (Active)  Date First Assessed/Time First Assessed: 11/17/19 1646   Wound Type: Puncture  Location: Abdomen  Location Orientation: Left;Anterior;Upper  Wound Description (Comments): G-tube insertion site   Present on Admission: No    Assessments 11/17/2019  4:47 PM 02/20/2020  7:37 AM  Dressing Type Gauze (Comment);Tape dressing Gauze (Comment)  Dressing  Changed New -  Dressing Status Clean;Dry;Intact -  Dressing Change Frequency PRN -  Site / Wound Assessment Clean;Dry;Pink -  Peri-wound Assessment Intact -  Closure None -  Drainage Amount None -  Treatment Cleansed -     No Linked orders to display      Other problems: Hypertension/grade 1 diastolic dysfunction:  -Continue amlodipine  -Echocardiogram this admission with preserved LVEF with evidence of grade   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   -Continue Keppra; levetiracetam level 35.9 on 11/12 -no further seizure activity since transitioned out of ICU setting therefore will discontinue seizure precaution  SIRS/Fever 2/2 recurrent enterococcus UTI -Patient developed mild elevation in temperature overnight to 1/13 associated with slight increase in respiratory secretions and congested sounding cough/abnormal pulmonary exam as well as tachycardia./14 respiratory status much improved and no further fevers. -Respiratory viral panel neg for COVID, influenza and RSV.   -UA slightly abnormal/cx c/w enterococcus UTI- started on nitrofurantoin (prior UTI treated w/ augmentin) has completed 3 days of treatment -X-ray with bilateral opacities concerning for early pneumonia but CT of the chest without pneumonia, broad-spectrum antibiotics dc'd 1/14 -Because of dehydration increased frequency of free water from 200 cc every 4 hours to every 2 hours on 1/13- stopped on 1/15  Inguinal hernias -Evaluated by general surgery this admission. Documented as chronically incarcerated. Patient has been clinically stable and tolerating tube feedings and having bowel movements so unless patient obstructed would not be considered a candidate for repair. Even if obstructed consulting surgeon was not sure he would be a candidate for hernia repair regardless  Goals of care:  -Palliative care was involved during this hospitalization.   -Goals of care were discussed. remains full code.  -Palliative care recommended outpatient follow-up with palliative providers. -May need to revisit end-of-life goals of care since patient making poor progress and currently having significant pain related to hypertonicity from brain injury   Data Reviewed: Basic Metabolic Panel: Recent Labs  Lab 02/15/20 0433 02/16/20 0537 02/18/20 0400  NA  --  144 143  K  --  3.7 3.9  CL  --  106 102  CO2  --  23 26  GLUCOSE  --  109* 98  BUN  --  18 19   CREATININE 0.77 0.64 0.69  CALCIUM  --  10.2 10.6*  MG  --   --  1.8  PHOS  --   --  4.0   Liver Function Tests: Recent Labs  Lab 02/16/20 0537 02/18/20 0400  AST 24 24  ALT 17 16  ALKPHOS 89 107  BILITOT 0.5 0.5  PROT 7.1 7.8  ALBUMIN 3.1* 3.4*   No results for input(s): LIPASE, AMYLASE in the last 168 hours. No results for input(s): AMMONIA in the last 168 hours. CBC: Recent Labs  Lab 02/14/20 2242 02/16/20 0537 02/18/20 0400  WBC 5.8 4.3 3.8*  NEUTROABS  --   --  2.3  HGB 14.2 15.9 14.4  HCT 43.0 47.8 44.2  MCV 95.1 94.8 93.6  PLT 174 131* 132*   Cardiac Enzymes: No results for input(s): CKTOTAL, CKMB, CKMBINDEX, TROPONINI in the last 168 hours. BNP (last 3 results) Recent Labs    02/15/20 0433  BNP 3.9    ProBNP (last 3 results) No results for input(s): PROBNP in the last 8760 hours.  CBG: Recent Labs  Lab 02/20/20 1225 02/20/20 1632 02/20/20 2018 02/20/20 2350 02/21/20 0415  GLUCAP 129* 87 81 111* 92  Recent Results (from the past 240 hour(s))  Culture, Urine     Status: Abnormal   Collection Time: 02/14/20  4:38 AM   Specimen: Urine, Random  Result Value Ref Range Status   Specimen Description URINE, RANDOM  Final   Special Requests   Final    NONE Performed at [facility], [address] [place], [place] [address]    Culture >=100,000 COLONIES/mL ENTEROCOCCUS FAECALIS (A)  Final   Report Status 02/17/2020 FINAL  Final   Organism ID, Bacteria ENTEROCOCCUS FAECALIS (A)  Final      Susceptibility   Enterococcus faecalis - MIC*    AMPICILLIN <=2 SENSITIVE Sensitive     NITROFURANTOIN <=16 SENSITIVE Sensitive     VANCOMYCIN 1 SENSITIVE Sensitive     * >=100,000 COLONIES/mL ENTEROCOCCUS FAECALIS  Culture, blood (routine x 2)     Status: None   Collection Time: 02/14/20 10:42 PM   Specimen: BLOOD RIGHT ARM  Result Value Ref Range Status   Specimen Description BLOOD RIGHT ARM  Final   Special Requests   Final    BOTTLES DRAWN  AEROBIC AND ANAEROBIC Blood Culture adequate volume   Culture   Final    NO GROWTH 5 DAYS Performed at [facility], [address], [place], [place] [address]    Report Status 02/20/2020 FINAL  Final  Culture, blood (routine x 2)     Status: None   Collection Time: 02/14/20 10:42 PM   Specimen: BLOOD RIGHT HAND  Result Value Ref Range Status   Specimen Description BLOOD RIGHT HAND  Final   Special Requests   Final    BOTTLES DRAWN AEROBIC AND ANAEROBIC Blood Culture adequate volume   Culture   Final    NO GROWTH 5 DAYS Performed at [facility], [place] [address] [place], [place] [address]    Report Status 02/20/2020 FINAL  Final  Resp panel by RT-PCR (RSV, Flu A&B, Covid) Nasopharyngeal Swab     Status: None   Collection Time: 02/16/20  9:39 AM   Specimen: Nasopharyngeal Swab; Nasopharyngeal(NP) swabs in vial transport medium  Result Value Ref Range Status   SARS Coronavirus 2 by RT PCR NEGATIVE NEGATIVE Final    Comment: (NOTE) SARS-CoV-2 target nucleic acids are NOT DETECTED.  The SARS-CoV-2 RNA is generally detectable in upper respiratory specimens during the acute phase of infection. The lowest concentration of SARS-CoV-2 viral copies this assay can detect is 138 copies/mL. A negative result does not preclude SARS-Cov-2 infection and should not be used as the sole basis for treatment or other patient management decisions. A negative result may occur with  improper specimen collection/handling, submission of specimen other than nasopharyngeal swab, presence of viral mutation(s) within the areas targeted by this assay, and inadequate number of viral copies(<138 copies/mL). A negative result must be combined with clinical observations, patient history, and epidemiological information. The expected result is Negative.  Fact Sheet for Patients:  EntrepreneurPulse.com.au  Fact Sheet for Healthcare Providers:   IncredibleEmployment.be  This test is no t yet approved or cleared by the [place] FDA and  has been authorized for detection and/or diagnosis of SARS-CoV-2 by FDA under an Emergency Use Authorization (EUA). This EUA will remain  in effect (meaning this test can be used) for the duration of the COVID-19 declaration under Section 564(b)(1) of the Act, 21 U.S.C.section 360bbb-3(b)(1), unless the authorization is terminated  or revoked sooner.       Influenza A by  PCR NEGATIVE NEGATIVE Final   Influenza B by PCR NEGATIVE NEGATIVE Final    Comment: (NOTE) The Xpert Xpress SARS-CoV-2/FLU/RSV plus assay is intended as an aid in the diagnosis of influenza from Nasopharyngeal swab specimens and should not be used as a sole basis for treatment. Nasal washings and aspirates are unacceptable for Xpert Xpress SARS-CoV-2/FLU/RSV testing.  Fact Sheet for Patients: EntrepreneurPulse.com.au  Fact Sheet for Healthcare Providers: IncredibleEmployment.be  This test is not yet approved or cleared by the [place] FDA and has been authorized for detection and/or diagnosis of SARS-CoV-2 by FDA under an Emergency Use Authorization (EUA). This EUA will remain in effect (meaning this test can be used) for the duration of the COVID-19 declaration under Section 564(b)(1) of the Act, 21 U.S.C. section 360bbb-3(b)(1), unless the authorization is terminated or revoked.     Resp Syncytial Virus by PCR NEGATIVE NEGATIVE Final    Comment: (NOTE) Fact Sheet for Patients: EntrepreneurPulse.com.au  Fact Sheet for Healthcare Providers: IncredibleEmployment.be  This test is not yet approved or cleared by the [place] FDA and has been authorized for detection and/or diagnosis of SARS-CoV-2 by FDA under an Emergency Use Authorization (EUA). This EUA will remain in effect (meaning this test can be used) for  the duration of the COVID-19 declaration under Section 564(b)(1) of the Act, 21 U.S.C. section 360bbb-3(b)(1), unless the authorization is terminated or revoked.  Performed at [facility], [place] [address] [place], [place] [address]   MRSA PCR Screening     Status: None   Collection Time: 02/16/20 12:33 PM   Specimen: Nasal Mucosa; Nasopharyngeal  Result Value Ref Range Status   MRSA by PCR NEGATIVE NEGATIVE Final    Comment:        The GeneXpert MRSA Assay (FDA approved for NASAL specimens only), is one component of a comprehensive MRSA colonization surveillance program. It is not intended to diagnose MRSA infection nor to guide or monitor treatment for MRSA infections. Performed at [facility], [place] [address], [place],  [address]      Studies: No results found.  Scheduled Meds: . acetaminophen (TYLENOL) oral liquid 160 mg/5 mL  650 mg Per Tube Q6H  . amLODipine  10 mg Per Tube Daily  . baclofen  30 mg Per Tube TID  . chlorhexidine gluconate (MEDLINE KIT)  15 mL Mouth Rinse BID  . Darunavir-Cobicisctat-Emtricitabine-Tenofovir Alafenamide  1 tablet Oral Q breakfast  . famotidine  10.4 mg Per Tube BID  . feeding supplement (OSMOLITE 1.5 CAL)  237 mL Per Tube Q24H  . feeding supplement (OSMOLITE 1.5 CAL)  474 mL Per Tube TID  . feeding supplement (PROSource TF)  45 mL Per Tube TID  . fiber  1 packet Per Tube BID  . folic acid  1 mg Per Tube Daily  . free water  200 mL Per Tube Q4H  . gabapentin  200 mg Per Tube Q8H  . heparin injection (subcutaneous)  5,000 Units Subcutaneous Q8H  . levETIRAcetam  1,500 mg Per Tube BID  . Muscle Rub   Topical QID  . nystatin  5 mL Oral QID  . oxyCODONE  5 mg Per Tube Q8H  . QUEtiapine  12.5 mg Per Tube QHS  . scopolamine  1 patch Transdermal Q72H  . sulfamethoxazole-trimethoprim  1 tablet Per Tube Once per day on Mon Wed Fri  . thiamine  100 mg Per Tube Daily  . tiZANidine  6 mg Per Tube Q6H   Continuous  Infusions:   Active Problems:   Cardiac arrest [facility])   Community acquired pneumonia of left upper lobe of lung   Anoxic brain injury ([place])   Alcohol abuse   Acute respiratory failure with hypoxia (H[facility])   Vomiting   Pressure injury of skin   Small bowel obstruction ([place])   Palliative care encounter   Bowel obstruction (HCC)   Chronic respiratory failure (HCC)   Diastolic dysfunction   Tracheostomy dependent ([place])   Dysphagia   Protein-calorie malnutrition ([place])   Seizures ([place])   Bilateral recurrent inguinal hernia   Muscle spasticity   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HCC)   Tracheobronchitis   Sequela of Corynebacterium infection   Abnormal urinalysis   Urinary tract infection due to Enterococcus   Fever   Consultants:  PCCM  Palliative medicine  General surgery  Neurology  Interventional radiology  Procedures:  EEG  Echocardiogram  Tracheostomy  Antibiotics: Anti-infectives (From admission, onward)   Start     Dose/Rate Route Frequency Ordered Stop   02/17/20 1100  nitrofurantoin (FURADANTIN) 25 MG/5ML suspension 100 mg        100 mg Per Tube Every 12 hours 02/17/20 1006 02/20/20 0924   02/15/20 1000  vancomycin (VANCOCIN) IVPB 1000 mg/200 mL premix  Status:  Discontinued        1,000 mg 200 mL/hr over 60 Minutes Intravenous Every 12 hours 02/14/20 2226 02/16/20 0742   02/14/20 2245  piperacillin-tazobactam (ZOSYN) IVPB 3.375 g  Status:  Discontinued        3.375 g 12.5 mL/hr over 240 Minutes Intravenous Every 8 hours 02/14/20 2226 02/16/20 0742   02/14/20 2245  vancomycin (VANCOREADY) IVPB 1500 mg/300 mL        1,500 mg 150 mL/hr over 120 Minutes Intravenous  Once 02/14/20 2226 02/15/20 0739   02/01/20 2200  amoxicillin-clavulanate (AUGMENTIN) 250-62.5 MG/5ML suspension 500 mg        500 mg Per Tube Every 8 hours 02/01/20 1343 02/06/20 1333   01/31/20 1015  levofloxacin (LEVAQUIN) 25 MG/ML solution 500 mg  Status:  Discontinued         500 mg Per Tube Daily 01/31/20 0919 02/01/20 1343   01/31/20 1000  cefTRIAXone (ROCEPHIN) 1 g in sodium chloride 0.9 % 100 mL IVPB  Status:  Discontinued        1 g 200 mL/hr over 30 Minutes Intravenous Every 24 hours 01/31/20 0905 01/31/20 0919   01/19/20 2200  linezolid (ZYVOX) tablet 600 mg        600 mg Per Tube Every 12 hours 01/19/20 1534 01/25/20 2019   01/19/20 1100  levofloxacin (LEVAQUIN) tablet 500 mg  Status:  Discontinued        500 mg Per Tube Daily 01/19/20 1004 01/19/20 1534   12/19/19 0930  Darunavir-Cobicisctat-Emtricitabine-Tenofovir Alafenamide (SYMTUZA) 800-150-200-10 MG TABS 1 tablet        1 tablet Oral Daily with breakfast 12/19/19 0840     12/13/19 1000  bictegravir-emtricitabine-tenofovir AF (BIKTARVY) 50-200-25 MG per tablet 1 tablet  Status:  Discontinued        1 tablet Oral Daily 12/12/19 1413 12/19/19 0840   11/17/19 1548  ceFAZolin (ANCEF) 2-4 GM/100ML-% IVPB       Note to Pharmacy: Domenick Bookbinder   : cabinet override      11/17/19 1548 11/18/19 0359   11/16/19 1515  ceFAZolin (ANCEF) IVPB 2g/100 mL premix        2 g 200 mL/hr over 30 Minutes Intravenous To  Radiology 11/16/19 1511 11/17/19 1625   11/15/19 0900  sulfamethoxazole-trimethoprim (BACTRIM DS) 800-160 MG per tablet 1 tablet        1 tablet Per Tube Once per day on Mon Wed Fri 11/13/19 1906     11/13/19 1615  dolutegravir (TIVICAY) tablet 50 mg  Status:  Discontinued        50 mg Oral Daily 11/13/19 1521 12/12/19 1413   11/13/19 1615  emtricitabine-tenofovir AF (DESCOVY) 200-25 MG per tablet 1 tablet  Status:  Discontinued        1 tablet Per Tube Daily 11/13/19 1521 12/12/19 1413   11/13/19 1530  sulfamethoxazole-trimethoprim (BACTRIM DS) 800-160 MG per tablet 1 tablet  Status:  Discontinued        1 tablet Oral Once per day on Mon Wed Fri 11/13/19 1441 11/13/19 1906   11/13/19 1500  bictegravir-emtricitabine-tenofovir AF (BIKTARVY) 50-200-25 MG per tablet 1 tablet  Status:  Discontinued        1  tablet Oral Daily 11/13/19 1403 11/13/19 1521   11/10/19 1000  erythromycin 250 mg in sodium chloride 0.9 % 100 mL IVPB        250 mg 100 mL/hr over 60 Minutes Intravenous Every 8 hours 11/10/19 0907 11/11/19 2000   11/07/19 1200  ceFEPIme (MAXIPIME) 2 g in sodium chloride 0.9 % 100 mL IVPB        2 g 200 mL/hr over 30 Minutes Intravenous Every 8 hours 11/07/19 1013 11/13/19 2127   11/03/19 1200  cefTRIAXone (ROCEPHIN) 2 g in sodium chloride 0.9 % 100 mL IVPB        2 g 200 mL/hr over 30 Minutes Intravenous Every 24 hours 11/03/19 0922 11/05/19 1427   11/02/19 0800  vancomycin (VANCOREADY) IVPB 750 mg/150 mL  Status:  Discontinued        750 mg 150 mL/hr over 60 Minutes Intravenous Every 12 hours 11/01/19 1935 11/02/19 1105   11/02/19 0600  piperacillin-tazobactam (ZOSYN) IVPB 3.375 g  Status:  Discontinued        3.375 g 12.5 mL/hr over 240 Minutes Intravenous Every 8 hours 11/02/19 0311 11/03/19 0920   11/01/19 2200  ceFEPIme (MAXIPIME) 2 g in sodium chloride 0.9 % 100 mL IVPB  Status:  Discontinued        2 g 200 mL/hr over 30 Minutes Intravenous Every 12 hours 11/01/19 2143 11/02/19 0311   11/01/19 1915  vancomycin (VANCOREADY) IVPB 1500 mg/300 mL        1,500 mg 150 mL/hr over 120 Minutes Intravenous  Once 11/01/19 1909 11/01/19 2147   11/01/19 1845  cefTRIAXone (ROCEPHIN) 1 g in sodium chloride 0.9 % 100 mL IVPB        1 g 200 mL/hr over 30 Minutes Intravenous  Once 11/01/19 1842 11/01/19 2051   11/01/19 1845  azithromycin (ZITHROMAX) 500 mg in sodium chloride 0.9 % 250 mL IVPB        500 mg 250 mL/hr over 60 Minutes Intravenous  Once 11/01/19 1842 11/01/19 2051       Time spent: 20 minutes    Erin Hearing ANP  [facility]  7 am-330 pm/M-F for direct patient care and secure chat Please refer to [place] for contact information 02/21/2020, 7:58 AM  LOS: 112 days

## 2020-02-21 NOTE — Progress Notes (Addendum)
Occupational Therapy Treatment Patient Details Name: Ryan Orozco MRN: [id number] DOB: April 22, 1965 Today's Date: 02/21/2020    History of present illness 55 year old male found down outside convenience store 9/27. Pt with PEA and hypoxic brain injury. Trach 10/13, PEG 10/15. PMHx: HIV AIDS and alcohol abuse.  CIR physician consulted for spacticity management and s/p botox injections 02/08/20. COVID (-) 02/15/19.   OT comments  Pt noted to be more restless today and inability to follow one step commands. Guided pt in AAROM/PROM stretching of B UE though difficult due to frequent involuntary movements of B UE. Of note, able to extend R elbow to full extension with increased time so no elbow splint indicated at this time. Re-assembled B resting hand splints and applied to B UE. Recommend 4 hours on/off wearing schedule, but due to involuntary movements of B UE, unsure of how long splints will stay in place. Due to minimal progress and decreased ability to follow commands, reduced frequency to 1x/wk. Will follow-up again to assist in carryover of splint wear and assess for changes, but pt may then be appropriate for discharge from skilled therapy services.    Follow Up Recommendations  SNF;LTACH;Supervision/Assistance - 24 hour    Equipment Recommendations  Hospital bed;Wheelchair (measurements OT);Wheelchair cushion (measurements OT)    Recommendations for Other Services      Precautions / Restrictions Precautions Precautions: Fall Precaution Comments: trach, PEG, anoxic, flexor tone R>L LE/UE Required Braces or Orthoses: Other Brace Other Brace: bilateral resting hand splints Restrictions Weight Bearing Restrictions: No       Mobility Bed Mobility                  Transfers                      Balance                                           ADL either performed or assessed with clinical judgement   ADL                                          General ADL Comments: Session focused on UE ROM and splint wear. Pt with decreased ability to follow commands or respond appropriately. Total A for all ADLs     Vision   Vision Assessment?: No apparent visual deficits   Perception     Praxis      Cognition Arousal/Alertness: Awake/alert Behavior During Therapy: Restless Overall Cognitive Status: Difficult to assess Area of Impairment: Following commands;Problem solving               Rancho Levels of Cognitive Functioning Rancho Los Amigos Scales of Cognitive Functioning: Localized response   Current Attention Level: Focused Memory: Decreased short-term memory;Decreased recall of precautions Following Commands: Follows one step commands inconsistently Safety/Judgement: Decreased awareness of deficits;Decreased awareness of safety Awareness: Intellectual Problem Solving: Slow processing;Decreased initiation;Difficulty sequencing;Requires verbal cues;Requires tactile cues General Comments: pt moaning throughout, more restless than last session, arms flailing at both sides. Decreased attention, following of commands        Exercises     Shoulder Instructions       General Comments Located splint covers hanging in bathrooom where OT left them to dry after washing during  previous session. Re-assembled resting hand splints and applied after ROM of all joints of B UE. Attempted ROM of B LE to readjust positioning, increased grimacing with R LE motion and enable to extend similarly to L LE. Wrote on dry erase board in room what time OT donned splints and when splints should be doffed - coordinated with RN and NT in efforts to increase pt wear as appropriate    Pertinent Vitals/ Pain       Pain Assessment: Faces Faces Pain Scale: Hurts little more Pain Location: grimace with positioning and stretching Pain Descriptors / Indicators: Grimacing;Guarding Pain Intervention(s): Monitored during  session;Repositioned  Home Living                                          Prior Functioning/Environment              Frequency  Min 1X/week        Progress Toward Goals  OT Goals(current goals can now be found in the care plan section)  Progress towards OT goals: Not progressing toward goals - comment (decreased ability to follow commands, spasticity impairment)  Acute Rehab OT Goals Patient Stated Goal: unable to state OT Goal Formulation: Patient unable to participate in goal setting Time For Goal Achievement: 03/04/20 Potential to Achieve Goals: Fair ADL Goals Additional ADL Goal #1: Pt will follow one step appendicular commands 3/5 trials Additional ADL Goal #2: Pt will wash face with moderate assist/hand over hand assist Additional ADL Goal #3: Family will be independent with PROM and positioning bil. UEs Additional ADL Goal #4: Pt will maintain EOB sitting with max A in prep for functional mobility  Plan Discharge plan remains appropriate;Frequency needs to be updated    Co-evaluation                 AM-PAC OT "6 Clicks" Daily Activity     Outcome Measure   Help from another person eating meals?: Total Help from another person taking care of personal grooming?: Total Help from another person toileting, which includes using toliet, bedpan, or urinal?: Total Help from another person bathing (including washing, rinsing, drying)?: Total Help from another person to put on and taking off regular upper body clothing?: Total Help from another person to put on and taking off regular lower body clothing?: Total 6 Click Score: 6    End of Session Equipment Utilized During Treatment: Oxygen  OT Visit Diagnosis: Cognitive communication deficit (R41.841);Muscle weakness (generalized) (M62.81);Pain;Hemiplegia and hemiparesis Symptoms and signs involving cognitive functions: Other cerebrovascular disease Hemiplegia - Right/Left: Right Hemiplegia  - dominant/non-dominant: Dominant Hemiplegia - caused by: Other cerebrovascular disease Pain - Right/Left: Right Pain - part of body: Shoulder;Arm;Hand;Leg   Activity Tolerance Other (comment) (limited due to cognition, increased restlessness)   Patient Left in bed;with call bell/phone within reach;with nursing/sitter in room   Nurse Communication Mobility status;Other (comment) (splints)        Time: 5277-8242 OT Time Calculation (min): 19 min  Charges: OT General Charges $OT Visit: 1 Visit OT Treatments $Therapeutic Exercise: 8-22 mins  Lorre Munroe, OTR/L   Lorre Munroe 02/21/2020, 3:53 PM

## 2020-02-21 NOTE — Plan of Care (Signed)
  Problem: Pain Managment: Goal: General experience of comfort will improve Outcome: Progressing   Problem: Respiratory: Goal: Patent airway maintenance will improve Outcome: Progressing   Problem: Respiratory: Goal: Will regain and/or maintain adequate ventilation Outcome: Progressing   Problem: Nutritional: Goal: Risk of aspiration will decrease Outcome: Progressing   Problem: Nutritional: Goal: Dietary intake will improve Outcome: Progressing

## 2020-02-22 LAB — GLUCOSE, CAPILLARY
Glucose-Capillary: 105 mg/dL — ABNORMAL HIGH (ref 70–99)
Glucose-Capillary: 108 mg/dL — ABNORMAL HIGH (ref 70–99)
Glucose-Capillary: 119 mg/dL — ABNORMAL HIGH (ref 70–99)
Glucose-Capillary: 77 mg/dL (ref 70–99)
Glucose-Capillary: 92 mg/dL (ref 70–99)

## 2020-02-22 MED ORDER — POLYETHYLENE GLYCOL 3350 17 G PO PACK
17.0000 g | PACK | Freq: Every day | ORAL | Status: DC
  Administered 2020-02-22 – 2020-04-05 (×30): 17 g
  Filled 2020-02-22 (×37): qty 1

## 2020-02-22 MED ORDER — MAGNESIUM HYDROXIDE 400 MG/5ML PO SUSP
30.0000 mL | Freq: Once | ORAL | Status: AC
  Administered 2020-02-22: 30 mL
  Filled 2020-02-22: qty 30

## 2020-02-22 NOTE — Plan of Care (Signed)
  Problem: Clinical Measurements: Goal: Respiratory complications will improve Outcome: Progressing   Problem: Clinical Measurements: Goal: Cardiovascular complication will be avoided Outcome: Progressing   Problem: Elimination: Goal: Will not experience complications related to bowel motility Outcome: Progressing   Problem: Elimination: Goal: Will not experience complications related to urinary retention Outcome: Progressing   Problem: Pain Managment: Goal: General experience of comfort will improve Outcome: Progressing   

## 2020-02-22 NOTE — Progress Notes (Signed)
[facility] PROGRESS NOTE  Ryan Orozco GYF:[id number] DOB: 1966/01/07 DOA: 11/01/2019 PCP: Default, Provider, MD    11/16   Status: Remains inpatient appropriate because:Altered mental status, Unsafe d/c plan and Inpatient level of care appropriate due to severity of illness. Patient newly diagnosed with anoxic brain injury  Dispo: The patient is from: Home              Anticipated d/c is to: SNF              Anticipated d/c date is: > 3 days              Patient currently is medically stable to d/c.  Barriers to discharge: No SNF bed offers.  Needs trach capable facility. TOC communicating with financial counseling team who have yet to hear back from DSS regarding patient's assigned caseworker.  TOC spoke with [facility].  Patient would be an excellent candidate but they request he have active Medicaid and disability before they can accept him.  Also faxed out information to [facility] as of 1/10   Code Status: Full Family Communication: 1/14 dtr [name] updated DVT prophylaxis: Subcutaneous heparin Vaccination status: Will order first dose Pfizer COVID-vaccine on 1/10; next week we will order pneumococcal and influenza vaccinations  Foley catheter:  No  HPI: 55 year old male with HIV, chronic alcohol abuse who was presented to the emergency department after being found unresponsive outside a convinient store.  EMS found him pulseless in PEA, unknown downtime, successfully resuscitated and brought to the emergency department.  UDS positive for THC and benzodiazepines.  CT head unremarkable.  Failed extubation trials due to severe anoxic brain injury and had tracheostomy placed.  PEG placed on 10/15.  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  This has led to significant pain as well.  [name] was consulted and recommendations/input/orders highly appreciated.  She may also benefit  from Botox injections to both hips to aid in treatment of spasticity.   Significant Hospital Events   9/29 Admit post PEA arrest. UDS positive for THC, benzo's. ETOH 177 10/05 EEG ongoing. Versed restarted overnight due to agitation. Nicardipine increased.  10/06 Developed tachypnea with WUA, no follow commands off sedation  10/07 Vomiting, TF held / restarted with recurrent vomiting 10/16 tracheostomy collar for 9 hours 10/18->10/19 off vent all night  10/20> TC 11/15 > downsized to #4 Shiley flex CFS  Subjective: Patient very alert and animated this morning.  Noted with purposeful movements of upper extremities.  Attempted to move right lower extremity and he pushed my arm away.  While communicating with patient was making silly sounds and patient mimicked me.  He later smiled and laughed.  Objective: Vitals:   02/22/20 0321 02/22/20 0500  BP:  135/81  Pulse: 95 75  Resp: (!) 25 19  Temp:  98 F (36.7 C)  SpO2: 97% 97%    Intake/Output Summary (Last 24 hours) at 02/22/2020 0752 Last data filed at 02/21/2020 1820 Gross per 24 hour  Intake --  Output 1000 ml  Net -1000 ml   Filed Weights   02/13/20 0750 02/18/20 0500 02/19/20 0420  Weight: 70.5 kg 75.5 kg 73 kg    Exam: General: Alert, very active this morning, no acute distress Pulmonary: Bilateral lung sounds coarse but clear, noted with thick yellow-green secretions in mouth and on side of lip.  Also thick yellow-green secretions at the distal aspect of trach.  Trach collar  FiO2 21%.  Increased work of Cardiac: Normal heart sounds, extremities are warm with adequate capillary refill, pulse regular nontachycardic Abdomen: PEG tube in place for tube feedings and medications, abdomen soft and nontender and nondistended with normoactive bowel sounds.  LBM 1/13 Neurological: Awake interactive today. Continues with hypertonicity worse in the lower extremities involving the hip flexors and hamstrings; continues to have very  limited extension of RLE and attempts to extend past 30 to 45 degrees pain response from patient- limited use of upper extremities with action primarily initiated from the shoulder girth. Psychiatric:   Assessment/Plan: Acute problems: Anoxic brain injury 2/2 PEA arrest/pain syndrome secondary to hypertonicity/spastic tetraplegia:  -Presumed 2/2 combination alcohol and BZD overdose with UDS positive for THC and BZDs -Significant brain injury with underlying spastic tetraplegia long-term SNF placement recommended-continue PT/OT/SLP -Continue baclofen, gabapentin, scheduled oxycodone and Zanaflex  -Appreciate assistance of [name] -1/6 Botox injection into the quadriceps and rectus femoris as well as the biceps femoris and hamstrings, semimembranosus and semitendinosus.  Unable to inject iliopsoas without EMG guidance. -Continue bilateral upper extremity WHO resting splints along with PROM per nursing staff every 4 hours -Continue low-dose Seroquel for brain injury sequela -Continue scheduled Tylenol for pain along with Crea muscle rub -Continue KREG rotational bed   Chronic respiratory failure with inability to maintain patent airway requiring chronic tracheostomy tube/Recent Corynebacterium tracheobronchitis (resolved) -Continue PMV training per SLP-#4 cuffless trach in place-trach changed out on 1/10 RT -Not a candidate for decannulation until patient can reliably follow commands, cough and clear airway and it appears current mentation will likely be patient's new baseline -continue physiotherapy with Vibra vest along with supervised PMV trials  HIV:  -CD4 count of 124 October 2021 (had been as high as 250 Dec 2020)-as of today up to 259 -Emesis recommends repeating viral load which has been ordered on 1/14 -Dc'd Tivicay and Descovy infavor of Biktarvy to for eventual dc to SNF; per my discussion with infectious disease physician [name] on 12/23 preferred regimen would be Descovy and  Tivicay-she will contact the HIV pharmacist to attempt to obtain discount cards to make this a more affordable option.  Once this has been confirmed will transition back to Descovy and Tivicay -CD4 count on 12/23 up to 259 -ID recommends continuing prophylactic Bactrim an additional 3 months  Dysphagia 2/2 anoxic brain injury -Continue tube feeding per PEG -SLP attempted to introduce oral substances on 12/27 but patient did not accept and was pushing out with his tongue.  Given his acute UTI he may have been nauseated and this may have been reason for him not wishing to eat.  ?  Constipation -No documented bowel movement since 1/13 -We will continue scheduled Colace, fiber supplements per tube -Add daily MiraLAX -Give one-time dose milk of magnesia today 1/20 -Suspect requirement for narcotics and multiple muscle relaxers contributing to decreased GI motility and constipation symptoms  Protein calorie malnutrition nutrition Status: Nutrition Problem: Increased nutrient needs Etiology: acute illness Signs/Symptoms: estimated needs Interventions: Tube feeding bolus doses of Osmolite, Juven twice daily, Prosource TF 45 mL 3 times daily Estimated body mass index is 25.98 kg/m as calculated from the following:   Height as of this encounter: 5' 6"  (1.676 m).   Weight as of this encounter: 73 kg.   Multiple decubiti not POA Wound / Incision (Open or Dehisced) 11/17/19 Puncture Abdomen Left;Anterior;Upper G-tube insertion site  (Active)  Date First Assessed/Time First Assessed: 11/17/19 1646   Wound Type: Puncture  Location: Abdomen  Location Orientation: Left;Anterior;Upper  Wound Description (Comments): G-tube insertion site   Present on Admission: No    Assessments 11/17/2019  4:47 PM 02/20/2020  7:37 AM  Dressing Type Gauze (Comment);Tape dressing Gauze (Comment)  Dressing Changed New --  Dressing Status Clean;Dry;Intact --  Dressing Change Frequency PRN --  Site / Wound Assessment  Clean;Dry;Pink --  Peri-wound Assessment Intact --  Closure None --  Drainage Amount None --  Treatment Cleansed --     No Linked orders to display      Other problems: Hypertension/grade 1 diastolic dysfunction:  -Continue amlodipine  -Echocardiogram this admission with preserved LVEF with evidence of grade   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   -Continue Keppra; levetiracetam level 35.9 on 11/12 -no further seizure activity since transitioned out of ICU setting therefore will discontinue seizure precaution  SIRS/Fever 2/2 recurrent enterococcus UTI -Patient developed mild elevation in temperature overnight to 1/13 associated with slight increase in respiratory secretions and congested sounding cough/abnormal pulmonary exam as well as tachycardia./14 respiratory status much improved and no further fevers. -Respiratory viral panel neg for COVID, influenza and RSV.   -UA slightly abnormal/cx c/w enterococcus UTI- started on nitrofurantoin (prior UTI treated w/ augmentin) has completed 3 days of treatment -X-ray with bilateral opacities concerning for early pneumonia but CT of the chest without pneumonia, broad-spectrum antibiotics dc'd 1/14 -Because of dehydration increased frequency of free water from 200 cc every 4 hours to every 2 hours on 1/13- stopped on 1/15  Inguinal hernias -Evaluated by general surgery this admission. Documented as chronically incarcerated. Patient has been clinically stable and tolerating tube feedings and having bowel movements so unless patient obstructed would not be considered a candidate for repair. Even if obstructed consulting surgeon was not sure he would be a candidate for hernia repair regardless  Goals of care:  -Palliative care was involved during this hospitalization.   -Goals of care were discussed. remains full code.  -Palliative care recommended outpatient follow-up with palliative providers. -May need to revisit  end-of-life goals of care since patient making poor progress and currently having significant pain related to hypertonicity from brain injury   Data Reviewed: Basic Metabolic Panel: Recent Labs  Lab 02/16/20 0537 02/18/20 0400  NA 144 143  K 3.7 3.9  CL 106 102  CO2 23 26  GLUCOSE 109* 98  BUN 18 19  CREATININE 0.64 0.69  CALCIUM 10.2 10.6*  MG  --  1.8  PHOS  --  4.0   Liver Function Tests: Recent Labs  Lab 02/16/20 0537 02/18/20 0400  AST 24 24  ALT 17 16  ALKPHOS 89 107  BILITOT 0.5 0.5  PROT 7.1 7.8  ALBUMIN 3.1* 3.4*   No results for input(s): LIPASE, AMYLASE in the last 168 hours. No results for input(s): AMMONIA in the last 168 hours. CBC: Recent Labs  Lab 02/16/20 0537 02/18/20 0400  WBC 4.3 3.8*  NEUTROABS  --  2.3  HGB 15.9 14.4  HCT 47.8 44.2  MCV 94.8 93.6  PLT 131* 132*   Cardiac Enzymes: No results for input(s): CKTOTAL, CKMB, CKMBINDEX, TROPONINI in the last 168 hours. BNP (last 3 results) Recent Labs    02/15/20 0433  BNP 3.9    ProBNP (last 3 results) No results for input(s): PROBNP in the last 8760 hours.  CBG: Recent Labs  Lab 02/21/20 1237 02/21/20 1639 02/21/20 1943 02/22/20 0015 02/22/20 0738  GLUCAP 100* 84 89 108* 105*    Recent Results (  from the past 240 hour(s))  Culture, Urine     Status: Abnormal   Collection Time: 02/14/20  4:38 AM   Specimen: Urine, Random  Result Value Ref Range Status   Specimen Description URINE, RANDOM  Final   Special Requests   Final    NONE Performed at [facility], [address], [place], [place] [address]    Culture >=100,000 COLONIES/mL ENTEROCOCCUS FAECALIS (A)  Final   Report Status 02/17/2020 FINAL  Final   Organism ID, Bacteria ENTEROCOCCUS FAECALIS (A)  Final      Susceptibility   Enterococcus faecalis - MIC*    AMPICILLIN <=2 SENSITIVE Sensitive     NITROFURANTOIN <=16 SENSITIVE Sensitive     VANCOMYCIN 1 SENSITIVE Sensitive     * >=100,000 COLONIES/mL  ENTEROCOCCUS FAECALIS  Culture, blood (routine x 2)     Status: None   Collection Time: 02/14/20 10:42 PM   Specimen: BLOOD RIGHT ARM  Result Value Ref Range Status   Specimen Description BLOOD RIGHT ARM  Final   Special Requests   Final    BOTTLES DRAWN AEROBIC AND ANAEROBIC Blood Culture adequate volume   Culture   Final    NO GROWTH 5 DAYS Performed at [facility], [address] [place], [place] [address]    Report Status 02/20/2020 FINAL  Final  Culture, blood (routine x 2)     Status: None   Collection Time: 02/14/20 10:42 PM   Specimen: BLOOD RIGHT HAND  Result Value Ref Range Status   Specimen Description BLOOD RIGHT HAND  Final   Special Requests   Final    BOTTLES DRAWN AEROBIC AND ANAEROBIC Blood Culture adequate volume   Culture   Final    NO GROWTH 5 DAYS Performed at [facility], [place] [address], [place], [place] [address]    Report Status 02/20/2020 FINAL  Final  Resp panel by RT-PCR (RSV, Flu A&B, Covid) Nasopharyngeal Swab     Status: None   Collection Time: 02/16/20  9:39 AM   Specimen: Nasopharyngeal Swab; Nasopharyngeal(NP) swabs in vial transport medium  Result Value Ref Range Status   SARS Coronavirus 2 by RT PCR NEGATIVE NEGATIVE Final    Comment: (NOTE) SARS-CoV-2 target nucleic acids are NOT DETECTED.  The SARS-CoV-2 RNA is generally detectable in upper respiratory specimens during the acute phase of infection. The lowest concentration of SARS-CoV-2 viral copies this assay can detect is 138 copies/mL. A negative result does not preclude SARS-Cov-2 infection and should not be used as the sole basis for treatment or other patient management decisions. A negative result may occur with  improper specimen collection/handling, submission of specimen other than nasopharyngeal swab, presence of viral mutation(s) within the areas targeted by this assay, and inadequate number of viral copies(<138 copies/mL). A negative result must be combined  with clinical observations, patient history, and epidemiological information. The expected result is Negative.  Fact Sheet for Patients:  EntrepreneurPulse.com.au  Fact Sheet for Healthcare Providers:  IncredibleEmployment.be  This test is no t yet approved or cleared by the [place] FDA and  has been authorized for detection and/or diagnosis of SARS-CoV-2 by FDA under an Emergency Use Authorization (EUA). This EUA will remain  in effect (meaning this test can be used) for the duration of the COVID-19 declaration under Section 564(b)(1) of the Act, 21 U.S.C.section 360bbb-3(b)(1), unless the authorization is terminated  or revoked sooner.       Influenza A by PCR NEGATIVE  NEGATIVE Final   Influenza B by PCR NEGATIVE NEGATIVE Final    Comment: (NOTE) The Xpert Xpress SARS-CoV-2/FLU/RSV plus assay is intended as an aid in the diagnosis of influenza from Nasopharyngeal swab specimens and should not be used as a sole basis for treatment. Nasal washings and aspirates are unacceptable for Xpert Xpress SARS-CoV-2/FLU/RSV testing.  Fact Sheet for Patients: EntrepreneurPulse.com.au  Fact Sheet for Healthcare Providers: IncredibleEmployment.be  This test is not yet approved or cleared by the [place] FDA and has been authorized for detection and/or diagnosis of SARS-CoV-2 by FDA under an Emergency Use Authorization (EUA). This EUA will remain in effect (meaning this test can be used) for the duration of the COVID-19 declaration under Section 564(b)(1) of the Act, 21 U.S.C. section 360bbb-3(b)(1), unless the authorization is terminated or revoked.     Resp Syncytial Virus by PCR NEGATIVE NEGATIVE Final    Comment: (NOTE) Fact Sheet for Patients: EntrepreneurPulse.com.au  Fact Sheet for Healthcare Providers: IncredibleEmployment.be  This test is not yet approved  or cleared by the [place] FDA and has been authorized for detection and/or diagnosis of SARS-CoV-2 by FDA under an Emergency Use Authorization (EUA). This EUA will remain in effect (meaning this test can be used) for the duration of the COVID-19 declaration under Section 564(b)(1) of the Act, 21 U.S.C. section 360bbb-3(b)(1), unless the authorization is terminated or revoked.  Performed at [facility], [place] [address], [place], [place] [address]   MRSA PCR Screening     Status: None   Collection Time: 02/16/20 12:33 PM   Specimen: Nasal Mucosa; Nasopharyngeal  Result Value Ref Range Status   MRSA by PCR NEGATIVE NEGATIVE Final    Comment:        The GeneXpert MRSA Assay (FDA approved for NASAL specimens only), is one component of a comprehensive MRSA colonization surveillance program. It is not intended to diagnose MRSA infection nor to guide or monitor treatment for MRSA infections. Performed at [facility], [address], [place], [place] [address]      Studies: No results found.  Scheduled Meds:  acetaminophen (TYLENOL) oral liquid 160 mg/5 mL  650 mg Per Tube Q6H   amLODipine  10 mg Per Tube Daily   baclofen  30 mg Per Tube TID   chlorhexidine gluconate (MEDLINE KIT)  15 mL Mouth Rinse BID   Darunavir-Cobicisctat-Emtricitabine-Tenofovir Alafenamide  1 tablet Oral Q breakfast   famotidine  10.4 mg Per Tube BID   feeding supplement (OSMOLITE 1.5 CAL)  237 mL Per Tube Q24H   feeding supplement (OSMOLITE 1.5 CAL)  474 mL Per Tube TID   feeding supplement (PROSource TF)  45 mL Per Tube TID   fiber  1 packet Per Tube BID   folic acid  1 mg Per Tube Daily   free water  200 mL Per Tube Q4H   gabapentin  200 mg Per Tube Q8H   heparin injection (subcutaneous)  5,000 Units Subcutaneous Q8H   levETIRAcetam  1,500 mg Per Tube BID   Muscle Rub   Topical QID   nystatin  5 mL Oral QID   oxyCODONE  5 mg Per Tube Q8H   QUEtiapine  12.5 mg  Per Tube QHS   scopolamine  1 patch Transdermal Q72H   sulfamethoxazole-trimethoprim  1 tablet Per Tube Once per day on Mon Wed Fri   thiamine  100 mg Per Tube Daily   tiZANidine  6 mg Per Tube Q6H   Continuous Infusions:  Active Problems:   Cardiac arrest [facility]   Community acquired pneumonia of left upper lobe of lung   Anoxic brain injury ([place])   Alcohol abuse   Acute respiratory failure with hypoxia (HCC)   Vomiting   Pressure injury of skin   Small bowel obstruction ([place])   Palliative care encounter   Bowel obstruction (HCC)   Chronic respiratory failure (HCC)   Diastolic dysfunction   Tracheostomy dependent ([place])   Dysphagia   Protein-calorie malnutrition ([place])   Seizures ([place])   Bilateral recurrent inguinal hernia   Muscle spasticity   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HCC)   Tracheobronchitis   Sequela of Corynebacterium infection   Abnormal urinalysis   Urinary tract infection due to Enterococcus   Fever   Consultants:  PCCM  Palliative medicine  General surgery  Neurology  Interventional radiology  Procedures:  EEG  Echocardiogram  Tracheostomy  Antibiotics: Anti-infectives (From admission, onward)   Start     Dose/Rate Route Frequency Ordered Stop   02/17/20 1100  nitrofurantoin (FURADANTIN) 25 MG/5ML suspension 100 mg        100 mg Per Tube Every 12 hours 02/17/20 1006 02/20/20 0924   02/15/20 1000  vancomycin (VANCOCIN) IVPB 1000 mg/200 mL premix  Status:  Discontinued        1,000 mg 200 mL/hr over 60 Minutes Intravenous Every 12 hours 02/14/20 2226 02/16/20 0742   02/14/20 2245  piperacillin-tazobactam (ZOSYN) IVPB 3.375 g  Status:  Discontinued        3.375 g 12.5 mL/hr over 240 Minutes Intravenous Every 8 hours 02/14/20 2226 02/16/20 0742   02/14/20 2245  vancomycin (VANCOREADY) IVPB 1500 mg/300 mL        1,500 mg 150 mL/hr over 120 Minutes Intravenous  Once 02/14/20 2226 02/15/20 0739   02/01/20 2200   amoxicillin-clavulanate (AUGMENTIN) 250-62.5 MG/5ML suspension 500 mg        500 mg Per Tube Every 8 hours 02/01/20 1343 02/06/20 1333   01/31/20 1015  levofloxacin (LEVAQUIN) 25 MG/ML solution 500 mg  Status:  Discontinued        500 mg Per Tube Daily 01/31/20 0919 02/01/20 1343   01/31/20 1000  cefTRIAXone (ROCEPHIN) 1 g in sodium chloride 0.9 % 100 mL IVPB  Status:  Discontinued        1 g 200 mL/hr over 30 Minutes Intravenous Every 24 hours 01/31/20 0905 01/31/20 0919   01/19/20 2200  linezolid (ZYVOX) tablet 600 mg        600 mg Per Tube Every 12 hours 01/19/20 1534 01/25/20 2019   01/19/20 1100  levofloxacin (LEVAQUIN) tablet 500 mg  Status:  Discontinued        500 mg Per Tube Daily 01/19/20 1004 01/19/20 1534   12/19/19 0930  Darunavir-Cobicisctat-Emtricitabine-Tenofovir Alafenamide (SYMTUZA) 800-150-200-10 MG TABS 1 tablet        1 tablet Oral Daily with breakfast 12/19/19 0840     12/13/19 1000  bictegravir-emtricitabine-tenofovir AF (BIKTARVY) 50-200-25 MG per tablet 1 tablet  Status:  Discontinued        1 tablet Oral Daily 12/12/19 1413 12/19/19 0840   11/17/19 1548  ceFAZolin (ANCEF) 2-4 GM/100ML-% IVPB       Note to Pharmacy: Domenick Bookbinder   : cabinet override      11/17/19 1548 11/18/19 0359   11/16/19 1515  ceFAZolin (ANCEF) IVPB 2g/100 mL premix        2 g 200 mL/hr over 30 Minutes Intravenous To Radiology 11/16/19 1511  11/17/19 1625   11/15/19 0900  sulfamethoxazole-trimethoprim (BACTRIM DS) 800-160 MG per tablet 1 tablet        1 tablet Per Tube Once per day on Mon Wed Fri 11/13/19 1906     11/13/19 1615  dolutegravir (TIVICAY) tablet 50 mg  Status:  Discontinued        50 mg Oral Daily 11/13/19 1521 12/12/19 1413   11/13/19 1615  emtricitabine-tenofovir AF (DESCOVY) 200-25 MG per tablet 1 tablet  Status:  Discontinued        1 tablet Per Tube Daily 11/13/19 1521 12/12/19 1413   11/13/19 1530  sulfamethoxazole-trimethoprim (BACTRIM DS) 800-160 MG per tablet 1 tablet   Status:  Discontinued        1 tablet Oral Once per day on Mon Wed Fri 11/13/19 1441 11/13/19 1906   11/13/19 1500  bictegravir-emtricitabine-tenofovir AF (BIKTARVY) 50-200-25 MG per tablet 1 tablet  Status:  Discontinued        1 tablet Oral Daily 11/13/19 1403 11/13/19 1521   11/10/19 1000  erythromycin 250 mg in sodium chloride 0.9 % 100 mL IVPB        250 mg 100 mL/hr over 60 Minutes Intravenous Every 8 hours 11/10/19 0907 11/11/19 2000   11/07/19 1200  ceFEPIme (MAXIPIME) 2 g in sodium chloride 0.9 % 100 mL IVPB        2 g 200 mL/hr over 30 Minutes Intravenous Every 8 hours 11/07/19 1013 11/13/19 2127   11/03/19 1200  cefTRIAXone (ROCEPHIN) 2 g in sodium chloride 0.9 % 100 mL IVPB        2 g 200 mL/hr over 30 Minutes Intravenous Every 24 hours 11/03/19 0922 11/05/19 1427   11/02/19 0800  vancomycin (VANCOREADY) IVPB 750 mg/150 mL  Status:  Discontinued        750 mg 150 mL/hr over 60 Minutes Intravenous Every 12 hours 11/01/19 1935 11/02/19 1105   11/02/19 0600  piperacillin-tazobactam (ZOSYN) IVPB 3.375 g  Status:  Discontinued        3.375 g 12.5 mL/hr over 240 Minutes Intravenous Every 8 hours 11/02/19 0311 11/03/19 0920   11/01/19 2200  ceFEPIme (MAXIPIME) 2 g in sodium chloride 0.9 % 100 mL IVPB  Status:  Discontinued        2 g 200 mL/hr over 30 Minutes Intravenous Every 12 hours 11/01/19 2143 11/02/19 0311   11/01/19 1915  vancomycin (VANCOREADY) IVPB 1500 mg/300 mL        1,500 mg 150 mL/hr over 120 Minutes Intravenous  Once 11/01/19 1909 11/01/19 2147   11/01/19 1845  cefTRIAXone (ROCEPHIN) 1 g in sodium chloride 0.9 % 100 mL IVPB        1 g 200 mL/hr over 30 Minutes Intravenous  Once 11/01/19 1842 11/01/19 2051   11/01/19 1845  azithromycin (ZITHROMAX) 500 mg in sodium chloride 0.9 % 250 mL IVPB        500 mg 250 mL/hr over 60 Minutes Intravenous  Once 11/01/19 1842 11/01/19 2051       Time spent: 20 minutes    Erin Hearing ANP  [facility]  7 am-330  pm/M-F for direct patient care and secure chat Please refer to [place] for contact information 02/22/2020, 7:52 AM  LOS: 113 days

## 2020-02-22 NOTE — Plan of Care (Signed)
  Problem: Education: Goal: Knowledge of General Education information will improve Description: Including pain rating scale, medication(s)/side effects and non-pharmacologic comfort measures Outcome: Not Progressing   Problem: Health Behavior/Discharge Planning: Goal: Ability to manage health-related needs will improve Outcome: Not Progressing   Problem: Clinical Measurements: Goal: Ability to maintain clinical measurements within normal limits will improve Outcome: Not Progressing Goal: Will remain free from infection Outcome: Not Progressing Goal: Diagnostic test results will improve Outcome: Not Progressing Goal: Respiratory complications will improve Outcome: Not Progressing Goal: Cardiovascular complication will be avoided Outcome: Not Progressing   Problem: Activity: Goal: Risk for activity intolerance will decrease Outcome: Not Progressing   Problem: Nutrition: Goal: Adequate nutrition will be maintained Outcome: Not Progressing   Problem: Coping: Goal: Level of anxiety will decrease Outcome: Not Progressing   Problem: Elimination: Goal: Will not experience complications related to bowel motility Outcome: Not Progressing Goal: Will not experience complications related to urinary retention Outcome: Not Progressing   Problem: Pain Managment: Goal: General experience of comfort will improve Outcome: Not Progressing   Problem: Safety: Goal: Ability to remain free from injury will improve Outcome: Not Progressing   Problem: Skin Integrity: Goal: Risk for impaired skin integrity will decrease Outcome: Not Progressing   Problem: Education: Goal: 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 Outcome: Not Progressing   Problem: Activity: Goal: Ability to tolerate increased activity will improve Outcome: Not Progressing   Problem: Health Behavior/Discharge Planning: Goal: 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 Outcome: Not Progressing    Problem: Respiratory: Goal: Patent airway maintenance will improve Outcome: Not Progressing   Problem: Role Relationship: Goal: Ability to communicate will improve Outcome: Not Progressing   Problem: Education: Goal: Knowledge of the prescribed therapeutic regimen Outcome: Not Progressing Goal: Knowledge of disease or condition will improve Outcome: Not Progressing   Problem: Clinical Measurements: Goal: Neurologic status will improve Outcome: Not Progressing   Problem: Tissue Perfusion: Goal: Ability to maintain intracranial pressure will improve Outcome: Not Progressing   Problem: Respiratory: Goal: Will regain and/or maintain adequate ventilation Outcome: Not Progressing   Problem: Skin Integrity: Goal: Risk for impaired skin integrity will decrease Outcome: Not Progressing Goal: Demonstration of wound healing without infection will improve Outcome: Not Progressing   Problem: Psychosocial: Goal: Ability to verbalize positive feelings about self will improve Outcome: Not Progressing Goal: Ability to participate in self-care as condition permits will improve Outcome: Not Progressing Goal: Ability to identify appropriate support needs will improve Outcome: Not Progressing   Problem: Health Behavior/Discharge Planning: Goal: Ability to manage health-related needs will improve Outcome: Not Progressing   Problem: Nutritional: Goal: Risk of aspiration will decrease Outcome: Not Progressing Goal: Dietary intake will improve Outcome: Not Progressing   Problem: Communication: Goal: Ability to communicate needs accurately will improve Outcome: Not Progressing

## 2020-02-23 LAB — GLUCOSE, CAPILLARY
Glucose-Capillary: 96 mg/dL (ref 70–99)
Glucose-Capillary: 135 mg/dL — ABNORMAL HIGH (ref 70–99)
Glucose-Capillary: 87 mg/dL (ref 70–99)
Glucose-Capillary: 89 mg/dL (ref 70–99)
Glucose-Capillary: 136 mg/dL — ABNORMAL HIGH (ref 70–99)

## 2020-02-23 MED ORDER — MAGIC MOUTHWASH W/LIDOCAINE
5.0000 mL | Freq: Four times a day (QID) | ORAL | Status: DC
  Administered 2020-02-23 – 2020-04-08 (×179): 5 mL via ORAL
  Filled 2020-02-23 (×174): qty 5

## 2020-02-23 MED ORDER — OXYCODONE HCL 5 MG/5ML PO SOLN
5.0000 mg | Freq: Four times a day (QID) | ORAL | Status: DC
Start: 2020-02-23 — End: 2020-04-08
  Administered 2020-02-23 – 2020-04-08 (×179): 5 mg
  Filled 2020-02-23 (×179): qty 5

## 2020-02-23 NOTE — Plan of Care (Signed)
  Problem: Education: Goal: Knowledge of General Education information will improve Description: Including pain rating scale, medication(s)/side effects and non-pharmacologic comfort measures Outcome: Not Progressing   Problem: Health Behavior/Discharge Planning: Goal: Ability to manage health-related needs will improve Outcome: Not Progressing   Problem: Clinical Measurements: Goal: Ability to maintain clinical measurements within normal limits will improve Outcome: Not Progressing Goal: Will remain free from infection Outcome: Not Progressing Goal: Diagnostic test results will improve Outcome: Not Progressing Goal: Respiratory complications will improve Outcome: Not Progressing Goal: Cardiovascular complication will be avoided Outcome: Not Progressing   Problem: Activity: Goal: Risk for activity intolerance will decrease Outcome: Not Progressing   Problem: Nutrition: Goal: Adequate nutrition will be maintained Outcome: Not Progressing   Problem: Coping: Goal: Level of anxiety will decrease Outcome: Not Progressing   Problem: Elimination: Goal: Will not experience complications related to bowel motility Outcome: Not Progressing Goal: Will not experience complications related to urinary retention Outcome: Not Progressing   Problem: Pain Managment: Goal: General experience of comfort will improve Outcome: Not Progressing   Problem: Safety: Goal: Ability to remain free from injury will improve Outcome: Not Progressing   Problem: Skin Integrity: Goal: Risk for impaired skin integrity will decrease Outcome: Not Progressing   Problem: Education: Goal: 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 Outcome: Not Progressing   Problem: Activity: Goal: Ability to tolerate increased activity will improve Outcome: Not Progressing   Problem: Health Behavior/Discharge Planning: Goal: 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 Outcome: Not Progressing    Problem: Respiratory: Goal: Patent airway maintenance will improve Outcome: Not Progressing   Problem: Role Relationship: Goal: Ability to communicate will improve Outcome: Not Progressing   Problem: Education: Goal: Knowledge of the prescribed therapeutic regimen Outcome: Not Progressing Goal: Knowledge of disease or condition will improve Outcome: Not Progressing   Problem: Clinical Measurements: Goal: Neurologic status will improve Outcome: Not Progressing   Problem: Tissue Perfusion: Goal: Ability to maintain intracranial pressure will improve Outcome: Not Progressing   Problem: Respiratory: Goal: Will regain and/or maintain adequate ventilation Outcome: Not Progressing   Problem: Skin Integrity: Goal: Risk for impaired skin integrity will decrease Outcome: Not Progressing Goal: Demonstration of wound healing without infection will improve Outcome: Not Progressing   Problem: Psychosocial: Goal: Ability to verbalize positive feelings about self will improve Outcome: Not Progressing Goal: Ability to participate in self-care as condition permits will improve Outcome: Not Progressing Goal: Ability to identify appropriate support needs will improve Outcome: Not Progressing   Problem: Health Behavior/Discharge Planning: Goal: Ability to manage health-related needs will improve Outcome: Not Progressing   Problem: Nutritional: Goal: Risk of aspiration will decrease Outcome: Not Progressing Goal: Dietary intake will improve Outcome: Not Progressing   Problem: Communication: Goal: Ability to communicate needs accurately will improve Outcome: Not Progressing   

## 2020-02-23 NOTE — Progress Notes (Addendum)
[facility] PROGRESS NOTE  Ryan Orozco DBZ:[id number] DOB: 07/07/1965 DOA: 11/01/2019 PCP: Default, Provider, MD    11/16   Status: Remains inpatient appropriate because:Altered mental status, Unsafe d/c plan and Inpatient level of care appropriate due to severity of illness. Patient newly diagnosed with anoxic brain injury  Dispo: The patient is from: Home              Anticipated d/c is to: SNF              Anticipated d/c date is: > 3 days              Patient currently is medically stable to d/c.  Barriers to discharge: No SNF bed offers.  Needs trach capable facility. TOC communicating with financial counseling team who have yet to hear back from DSS regarding patient's assigned caseworker.  TOC spoke with [facility] rehab.  Patient would be an excellent candidate but they request he have active Medicaid and disability before they can accept him.  Also faxed out information to [facility] as of 1/10   Code Status: Full Family Communication: 1/14 dtr [name] updated DVT prophylaxis: Subcutaneous heparin Vaccination status: Will order first dose Pfizer COVID-vaccine on 1/10; next week we will order pneumococcal and influenza vaccinations  Foley catheter:  No  HPI: 55 year old male with HIV, chronic alcohol abuse who was presented to the emergency department after being found unresponsive outside a convinient store.  EMS found him pulseless in PEA, unknown downtime, successfully resuscitated and brought to the emergency department.  UDS positive for THC and benzodiazepines.  CT head unremarkable.  Failed extubation trials due to severe anoxic brain injury and had tracheostomy placed.  PEG placed on 10/15.  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  This has led to significant pain as well.  [name] was consulted and recommendations/input/orders highly appreciated.  She may also benefit  from Botox injections to both hips to aid in treatment of spasticity.   Significant Hospital Events   9/29 Admit post PEA arrest. UDS positive for THC, benzo's. ETOH 177 10/05 EEG ongoing. Versed restarted overnight due to agitation. Nicardipine increased.  10/06 Developed tachypnea with WUA, no follow commands off sedation  10/07 Vomiting, TF held / restarted with recurrent vomiting 10/16 tracheostomy collar for 9 hours 10/18->10/19 off vent all night  10/20> TC 11/15 > downsized to #4 Shiley flex CFS  Subjective: Awake.  Initially was calm and interactive but then became somewhat restless and was bringing hands towards mouth and trying to put hands in mouth.  Note tongue has debris and some redness and I suspect patient may be having mouth pain.  He also repeatedly stated "ow"  Objective: Vitals:   02/23/20 0620 02/23/20 0723  BP: 114/76 111/76  Pulse: 87 73  Resp: 16 19  Temp: (!) 97.3 F (36.3 C) 97.9 F (36.6 C)  SpO2: 97% 98%    Intake/Output Summary (Last 24 hours) at 02/23/2020 0737 Last data filed at 02/23/2020 1224 Gross per 24 hour  Intake --  Output 2375 ml  Net -2375 ml   Filed Weights   02/13/20 0750 02/18/20 0500 02/19/20 0420  Weight: 70.5 kg 75.5 kg 73 kg    Exam: General: Alert and initially calm but then became agitated in the context of apparent underlying pain. ENT: Oral mucous membranes are slightly reddened with yellowish exudate on the tongue Pulmonary: Bilateral lung sounds coarse to auscultation.  No  exudate on trach as was the case on 1/20.  No increased work of breathing trach collar FiO2 21%.   Cardiac: Normal heart sounds without tachycardia, extremities warm to touch Abdomen: PEG tube in place for tube feedings and medications.  Abdomen otherwise soft with normoactive bowel sounds.  LBM 1/13 Neurological: Awake interactive today. Continues with hypertonicity worse in the lower extremities involving the hip flexors and hamstrings; continues  to have very limited extension of RLE and attempts to extend past 30 to 45 degrees pain response from patient- limited use of upper extremities with action primarily initiated from the shoulder girth. Psychiatric: Awake initially calm but became somewhat agitated apparently in the context of pain.  Minimally verbal with grunts and a few understandable words such as "hey" and "ow"  Assessment/Plan: Acute problems: Anoxic brain injury 2/2 PEA arrest/pain syndrome secondary to hypertonicity/spastic tetraplegia:  -Presumed 2/2 combination alcohol and BZD overdose with UDS positive for THC and BZDs -Significant brain injury with underlying spastic tetraplegia long-term SNF placement recommended-continue PT/OT/SLP -Continue baclofen, gabapentin, scheduled oxycodone and Zanaflex -as of 1/21 patient seemed to be having increasing pain so have increased frequency of scheduled oxycodone to every 6 hours -Appreciate assistance of [name] -1/6 Botox injection into the quadriceps and rectus femoris as well as the biceps femoris and hamstrings, semimembranosus and semitendinosus.  Unable to inject iliopsoas without EMG guidance. -Continue bilateral upper extremity WHO resting splints along with PROM per nursing staff every 4 hours -Continue low-dose Seroquel for brain injury sequela -Continue scheduled Tylenol for pain along with Crea muscle rub -Continue KREG rotational bed   Chronic respiratory failure with inability to maintain patent airway requiring chronic tracheostomy tube/Recent Corynebacterium tracheobronchitis (resolved) -Continue PMV training per SLP-#4 cuffless trach in place-trach changed out on 1/10 RT -Not a candidate for decannulation until patient can reliably follow commands, cough and clear airway and it appears current mentation will likely be patient's new baseline -continue physiotherapy with Vibra vest along with supervised PMV trials  HIV:  -CD4 count of 124 October 2021 (had been  as high as 250 Dec 2020)-as of today up to 259 -Emesis recommends repeating viral load which has been ordered on 1/14 -Dc'd Tivicay and Descovy infavor of Biktarvy to for eventual dc to SNF; per my discussion with infectious disease physician [name] on 12/23 preferred regimen would be Descovy and Tivicay-she will contact the HIV pharmacist to attempt to obtain discount cards to make this a more affordable option.  Once this has been confirmed will transition back to Descovy and Tivicay -CD4 count on 12/23 up to 259 -ID recommends continuing prophylactic Bactrim an additional 3 months  Dysphagia 2/2 anoxic brain injury -Continue tube feeding per PEG -SLP attempted to introduce oral substances on 12/27 but patient did not accept and was pushing out with his tongue.  Given his acute UTI he may have been nauseated and this may have been reason for him not wishing to eat. -Patient also with some redness and exudate on tongue.  Recently treated for oral thrush.  We will begin Magic mouthwash with lidocaine  ?  Constipation -No documented bowel movement since 1/13 -We will continue scheduled Colace, fiber supplements per tube -Add daily MiraLAX -Given one-time dose milk of magnesia on 1/20-no documented bowel movement on flowsheet -Suspect requirement for narcotics and multiple muscle relaxers contributing to decreased GI motility and constipation symptoms  Protein calorie malnutrition nutrition Status: Nutrition Problem: Increased nutrient needs Etiology: acute illness Signs/Symptoms: estimated needs Interventions: Tube  feeding bolus doses of Osmolite, Juven twice daily, Prosource TF 45 mL 3 times daily Estimated body mass index is 25.98 kg/m as calculated from the following:   Height as of this encounter: _0  (1.676 m).   Weight as of this encounter: 73 kg.   Multiple decubiti not POA Wound / Incision (Open or Dehisced) 11/17/19 Puncture Abdomen Left;Anterior;Upper G-tube insertion site   (Active)  Date First Assessed/Time First Assessed: 11/17/19 1646   Wound Type: Puncture  Location: Abdomen  Location Orientation: Left;Anterior;Upper  Wound Description (Comments): G-tube insertion site   Present on Admission: No    Assessments 11/17/2019  4:47 PM 02/20/2020  7:37 AM  Dressing Type Gauze (Comment);Tape dressing Gauze (Comment)  Dressing Changed New --  Dressing Status Clean;Dry;Intact --  Dressing Change Frequency PRN --  Site / Wound Assessment Clean;Dry;Pink --  Peri-wound Assessment Intact --  Closure None --  Drainage Amount None --  Treatment Cleansed --     No Linked orders to display      Other problems: Hypertension/grade 1 diastolic dysfunction:  -Continue amlodipine  -Echocardiogram this admission with preserved LVEF with evidence of grade   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   -Continue Keppra; levetiracetam level 35.9 on 11/12 -no further seizure activity since transitioned out of ICU setting therefore will discontinue seizure precaution  SIRS/Fever 2/2 recurrent enterococcus UTI -Patient developed mild elevation in temperature overnight to 1/13 associated with slight increase in respiratory secretions and congested sounding cough/abnormal pulmonary exam as well as tachycardia./14 respiratory status much improved and no further fevers. -Respiratory viral panel neg for COVID, influenza and RSV.   -UA slightly abnormal/cx c/w enterococcus UTI- started on nitrofurantoin (prior UTI treated w/ augmentin) has completed 3 days of treatment -X-ray with bilateral opacities concerning for early pneumonia but CT of the chest without pneumonia, broad-spectrum antibiotics dc'd 1/14 -Because of dehydration increased frequency of free water from 200 cc every 4 hours to every 2 hours on 1/13- stopped on 1/15  Inguinal hernias -Evaluated by general surgery this admission. Documented as chronically incarcerated. Patient has been clinically stable  and tolerating tube feedings and having bowel movements so unless patient obstructed would not be considered a candidate for repair. Even if obstructed consulting surgeon was not sure he would be a candidate for hernia repair regardless  Goals of care:  -Palliative care was involved during this hospitalization.   -Goals of care were discussed. remains full code.  -Palliative care recommended outpatient follow-up with palliative providers. -May need to revisit end-of-life goals of care since patient making poor progress and currently having significant pain related to hypertonicity from brain injury   Data Reviewed: Basic Metabolic Panel: Recent Labs  Lab 02/18/20 0400  NA 143  K 3.9  CL 102  CO2 26  GLUCOSE 98  BUN 19  CREATININE 0.69  CALCIUM 10.6*  MG 1.8  PHOS 4.0   Liver Function Tests: Recent Labs  Lab 02/18/20 0400  AST 24  ALT 16  ALKPHOS 107  BILITOT 0.5  PROT 7.8  ALBUMIN 3.4*   No results for input(s): LIPASE, AMYLASE in the last 168 hours. No results for input(s): AMMONIA in the last 168 hours. CBC: Recent Labs  Lab 02/18/20 0400  WBC 3.8*  NEUTROABS 2.3  HGB 14.4  HCT 44.2  MCV 93.6  PLT 132*   Cardiac Enzymes: No results for input(s): CKTOTAL, CKMB, CKMBINDEX, TROPONINI in the last 168 hours. BNP (last 3 results) Recent Labs  02/15/20 0433  BNP 3.9    ProBNP (last 3 results) No results for input(s): PROBNP in the last 8760 hours.  CBG: Recent Labs  Lab 02/22/20 0738 02/22/20 1531 02/22/20 1937 02/22/20 2324 02/23/20 0342  GLUCAP 105* 119* 77 92 96    Recent Results (from the past 240 hour(s))  Culture, Urine     Status: Abnormal   Collection Time: 02/14/20  4:38 AM   Specimen: Urine, Random  Result Value Ref Range Status   Specimen Description URINE, RANDOM  Final   Special Requests   Final    NONE Performed at [facility], [place] [address], [place], [place] [address]    Culture >=100,000 COLONIES/mL ENTEROCOCCUS  FAECALIS (A)  Final   Report Status 02/17/2020 FINAL  Final   Organism ID, Bacteria ENTEROCOCCUS FAECALIS (A)  Final      Susceptibility   Enterococcus faecalis - MIC*    AMPICILLIN <=2 SENSITIVE Sensitive     NITROFURANTOIN <=16 SENSITIVE Sensitive     VANCOMYCIN 1 SENSITIVE Sensitive     * >=100,000 COLONIES/mL ENTEROCOCCUS FAECALIS  Culture, blood (routine x 2)     Status: None   Collection Time: 02/14/20 10:42 PM   Specimen: BLOOD RIGHT ARM  Result Value Ref Range Status   Specimen Description BLOOD RIGHT ARM  Final   Special Requests   Final    BOTTLES DRAWN AEROBIC AND ANAEROBIC Blood Culture adequate volume   Culture   Final    NO GROWTH 5 DAYS Performed at [facility], [address], [place], [place] [address]    Report Status 02/20/2020 FINAL  Final  Culture, blood (routine x 2)     Status: None   Collection Time: 02/14/20 10:42 PM   Specimen: BLOOD RIGHT HAND  Result Value Ref Range Status   Specimen Description BLOOD RIGHT HAND  Final   Special Requests   Final    BOTTLES DRAWN AEROBIC AND ANAEROBIC Blood Culture adequate volume   Culture   Final    NO GROWTH 5 DAYS Performed at [facility], [place] [address], [place], [place] [address]    Report Status 02/20/2020 FINAL  Final  Resp panel by RT-PCR (RSV, Flu A&B, Covid) Nasopharyngeal Swab     Status: None   Collection Time: 02/16/20  9:39 AM   Specimen: Nasopharyngeal Swab; Nasopharyngeal(NP) swabs in vial transport medium  Result Value Ref Range Status   SARS Coronavirus 2 by RT PCR NEGATIVE NEGATIVE Final    Comment: (NOTE) SARS-CoV-2 target nucleic acids are NOT DETECTED.  The SARS-CoV-2 RNA is generally detectable in upper respiratory specimens during the acute phase of infection. The lowest concentration of SARS-CoV-2 viral copies this assay can detect is 138 copies/mL. A negative result does not preclude SARS-Cov-2 infection and should not be used as the sole basis for treatment or other  patient management decisions. A negative result may occur with  improper specimen collection/handling, submission of specimen other than nasopharyngeal swab, presence of viral mutation(s) within the areas targeted by this assay, and inadequate number of viral copies(<138 copies/mL). A negative result must be combined with clinical observations, patient history, and epidemiological information. The expected result is Negative.  Fact Sheet for Patients:  EntrepreneurPulse.com.au  Fact Sheet for Healthcare Providers:  IncredibleEmployment.be  This test is no t yet approved or cleared by the [place] FDA and  has been authorized for detection and/or diagnosis of SARS-CoV-2 by FDA under an Emergency Use Authorization (  EUA). This EUA will remain  in effect (meaning this test can be used) for the duration of the COVID-19 declaration under Section 564(b)(1) of the Act, 21 U.S.C.section 360bbb-3(b)(1), unless the authorization is terminated  or revoked sooner.       Influenza A by PCR NEGATIVE NEGATIVE Final   Influenza B by PCR NEGATIVE NEGATIVE Final    Comment: (NOTE) The Xpert Xpress SARS-CoV-2/FLU/RSV plus assay is intended as an aid in the diagnosis of influenza from Nasopharyngeal swab specimens and should not be used as a sole basis for treatment. Nasal washings and aspirates are unacceptable for Xpert Xpress SARS-CoV-2/FLU/RSV testing.  Fact Sheet for Patients: EntrepreneurPulse.com.au  Fact Sheet for Healthcare Providers: IncredibleEmployment.be  This test is not yet approved or cleared by the [place] FDA and has been authorized for detection and/or diagnosis of SARS-CoV-2 by FDA under an Emergency Use Authorization (EUA). This EUA will remain in effect (meaning this test can be used) for the duration of the COVID-19 declaration under Section 564(b)(1) of the Act, 21 U.S.C. section  360bbb-3(b)(1), unless the authorization is terminated or revoked.     Resp Syncytial Virus by PCR NEGATIVE NEGATIVE Final    Comment: (NOTE) Fact Sheet for Patients: EntrepreneurPulse.com.au  Fact Sheet for Healthcare Providers: IncredibleEmployment.be  This test is not yet approved or cleared by the [place] FDA and has been authorized for detection and/or diagnosis of SARS-CoV-2 by FDA under an Emergency Use Authorization (EUA). This EUA will remain in effect (meaning this test can be used) for the duration of the COVID-19 declaration under Section 564(b)(1) of the Act, 21 U.S.C. section 360bbb-3(b)(1), unless the authorization is terminated or revoked.  Performed at [facility], [place] [address] [place], [place] [address]   MRSA PCR Screening     Status: None   Collection Time: 02/16/20 12:33 PM   Specimen: Nasal Mucosa; Nasopharyngeal  Result Value Ref Range Status   MRSA by PCR NEGATIVE NEGATIVE Final    Comment:        The GeneXpert MRSA Assay (FDA approved for NASAL specimens only), is one component of a comprehensive MRSA colonization surveillance program. It is not intended to diagnose MRSA infection nor to guide or monitor treatment for MRSA infections. Performed at [facility], [place] [address], [place], [place] [address]      Studies: No results found.  Scheduled Meds: . acetaminophen (TYLENOL) oral liquid 160 mg/5 mL  650 mg Per Tube Q6H  . amLODipine  10 mg Per Tube Daily  . baclofen  30 mg Per Tube TID  . chlorhexidine gluconate (MEDLINE KIT)  15 mL Mouth Rinse BID  . Darunavir-Cobicisctat-Emtricitabine-Tenofovir Alafenamide  1 tablet Oral Q breakfast  . famotidine  10.4 mg Per Tube BID  . feeding supplement (OSMOLITE 1.5 CAL)  237 mL Per Tube Q24H  . feeding supplement (OSMOLITE 1.5 CAL)  474 mL Per Tube TID  . feeding supplement (PROSource TF)  45 mL Per Tube TID  . fiber  1 packet Per Tube BID   . folic acid  1 mg Per Tube Daily  . free water  200 mL Per Tube Q4H  . gabapentin  200 mg Per Tube Q8H  . heparin injection (subcutaneous)  5,000 Units Subcutaneous Q8H  . levETIRAcetam  1,500 mg Per Tube BID  . Muscle Rub   Topical QID  . nystatin  5 mL Oral QID  . oxyCODONE  5 mg Per Tube Q8H  . polyethylene glycol  17 g Per Tube QHS  . QUEtiapine  12.5 mg Per Tube QHS  . scopolamine  1 patch Transdermal Q72H  . sulfamethoxazole-trimethoprim  1 tablet Per Tube Once per day on Mon Wed Fri  . thiamine  100 mg Per Tube Daily  . tiZANidine  6 mg Per Tube Q6H   Continuous Infusions:   Active Problems:   Cardiac arrest [facility])   Community acquired pneumonia of left upper lobe of lung   Anoxic brain injury ([place])   Alcohol abuse   Acute respiratory failure with hypoxia (HCC)   Vomiting   Pressure injury of skin   Small bowel obstruction ([place])   Palliative care encounter   Bowel obstruction (HCC)   Chronic respiratory failure (HCC)   Diastolic dysfunction   Tracheostomy dependent ([place])   Dysphagia   Protein-calorie malnutrition ([place])   Seizures ([place])   Bilateral recurrent inguinal hernia   Muscle spasticity   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HCC)   Tracheobronchitis   Sequela of Corynebacterium infection   Abnormal urinalysis   Urinary tract infection due to Enterococcus   Fever   Consultants:  PCCM  Palliative medicine  General surgery  Neurology  Interventional radiology  Procedures:  EEG  Echocardiogram  Tracheostomy  Antibiotics: Anti-infectives (From admission, onward)   Start     Dose/Rate Route Frequency Ordered Stop   02/17/20 1100  nitrofurantoin (FURADANTIN) 25 MG/5ML suspension 100 mg        100 mg Per Tube Every 12 hours 02/17/20 1006 02/20/20 0924   02/15/20 1000  vancomycin (VANCOCIN) IVPB 1000 mg/200 mL premix  Status:  Discontinued        1,000 mg 200 mL/hr over 60 Minutes Intravenous Every 12 hours 02/14/20 2226 02/16/20  0742   02/14/20 2245  piperacillin-tazobactam (ZOSYN) IVPB 3.375 g  Status:  Discontinued        3.375 g 12.5 mL/hr over 240 Minutes Intravenous Every 8 hours 02/14/20 2226 02/16/20 0742   02/14/20 2245  vancomycin (VANCOREADY) IVPB 1500 mg/300 mL        1,500 mg 150 mL/hr over 120 Minutes Intravenous  Once 02/14/20 2226 02/15/20 0739   02/01/20 2200  amoxicillin-clavulanate (AUGMENTIN) 250-62.5 MG/5ML suspension 500 mg        500 mg Per Tube Every 8 hours 02/01/20 1343 02/06/20 1333   01/31/20 1015  levofloxacin (LEVAQUIN) 25 MG/ML solution 500 mg  Status:  Discontinued        500 mg Per Tube Daily 01/31/20 0919 02/01/20 1343   01/31/20 1000  cefTRIAXone (ROCEPHIN) 1 g in sodium chloride 0.9 % 100 mL IVPB  Status:  Discontinued        1 g 200 mL/hr over 30 Minutes Intravenous Every 24 hours 01/31/20 0905 01/31/20 0919   01/19/20 2200  linezolid (ZYVOX) tablet 600 mg        600 mg Per Tube Every 12 hours 01/19/20 1534 01/25/20 2019   01/19/20 1100  levofloxacin (LEVAQUIN) tablet 500 mg  Status:  Discontinued        500 mg Per Tube Daily 01/19/20 1004 01/19/20 1534   12/19/19 0930  Darunavir-Cobicisctat-Emtricitabine-Tenofovir Alafenamide (SYMTUZA) 800-150-200-10 MG TABS 1 tablet        1 tablet Oral Daily with breakfast 12/19/19 0840     12/13/19 1000  bictegravir-emtricitabine-tenofovir AF (BIKTARVY) 50-200-25 MG per tablet 1 tablet  Status:  Discontinued        1 tablet Oral Daily 12/12/19 1413 12/19/19 0840   11/17/19 1548  ceFAZolin (ANCEF) 2-4 GM/100ML-% IVPB       Note to Pharmacy: Domenick Bookbinder   : cabinet override      11/17/19 1548 11/18/19 0359   11/16/19 1515  ceFAZolin (ANCEF) IVPB 2g/100 mL premix        2 g 200 mL/hr over 30 Minutes Intravenous To Radiology 11/16/19 1511 11/17/19 1625   11/15/19 0900  sulfamethoxazole-trimethoprim (BACTRIM DS) 800-160 MG per tablet 1 tablet        1 tablet Per Tube Once per day on Mon Wed Fri 11/13/19 1906     11/13/19 1615  dolutegravir  (TIVICAY) tablet 50 mg  Status:  Discontinued        50 mg Oral Daily 11/13/19 1521 12/12/19 1413   11/13/19 1615  emtricitabine-tenofovir AF (DESCOVY) 200-25 MG per tablet 1 tablet  Status:  Discontinued        1 tablet Per Tube Daily 11/13/19 1521 12/12/19 1413   11/13/19 1530  sulfamethoxazole-trimethoprim (BACTRIM DS) 800-160 MG per tablet 1 tablet  Status:  Discontinued        1 tablet Oral Once per day on Mon Wed Fri 11/13/19 1441 11/13/19 1906   11/13/19 1500  bictegravir-emtricitabine-tenofovir AF (BIKTARVY) 50-200-25 MG per tablet 1 tablet  Status:  Discontinued        1 tablet Oral Daily 11/13/19 1403 11/13/19 1521   11/10/19 1000  erythromycin 250 mg in sodium chloride 0.9 % 100 mL IVPB        250 mg 100 mL/hr over 60 Minutes Intravenous Every 8 hours 11/10/19 0907 11/11/19 2000   11/07/19 1200  ceFEPIme (MAXIPIME) 2 g in sodium chloride 0.9 % 100 mL IVPB        2 g 200 mL/hr over 30 Minutes Intravenous Every 8 hours 11/07/19 1013 11/13/19 2127   11/03/19 1200  cefTRIAXone (ROCEPHIN) 2 g in sodium chloride 0.9 % 100 mL IVPB        2 g 200 mL/hr over 30 Minutes Intravenous Every 24 hours 11/03/19 0922 11/05/19 1427   11/02/19 0800  vancomycin (VANCOREADY) IVPB 750 mg/150 mL  Status:  Discontinued        750 mg 150 mL/hr over 60 Minutes Intravenous Every 12 hours 11/01/19 1935 11/02/19 1105   11/02/19 0600  piperacillin-tazobactam (ZOSYN) IVPB 3.375 g  Status:  Discontinued        3.375 g 12.5 mL/hr over 240 Minutes Intravenous Every 8 hours 11/02/19 0311 11/03/19 0920   11/01/19 2200  ceFEPIme (MAXIPIME) 2 g in sodium chloride 0.9 % 100 mL IVPB  Status:  Discontinued        2 g 200 mL/hr over 30 Minutes Intravenous Every 12 hours 11/01/19 2143 11/02/19 0311   11/01/19 1915  vancomycin (VANCOREADY) IVPB 1500 mg/300 mL        1,500 mg 150 mL/hr over 120 Minutes Intravenous  Once 11/01/19 1909 11/01/19 2147   11/01/19 1845  cefTRIAXone (ROCEPHIN) 1 g in sodium chloride 0.9 % 100  mL IVPB        1 g 200 mL/hr over 30 Minutes Intravenous  Once 11/01/19 1842 11/01/19 2051   11/01/19 1845  azithromycin (ZITHROMAX) 500 mg in sodium chloride 0.9 % 250 mL IVPB        500 mg 250 mL/hr over 60 Minutes Intravenous  Once 11/01/19 1842 11/01/19 2051       Time spent: 20 minutes    Erin Hearing ANP  [facility]  7 am-330 pm/M-F for direct  patient care and secure chat Please refer to Amion for contact information 02/23/2020, 7:37 AM  LOS: 114 days

## 2020-02-24 LAB — GLUCOSE, CAPILLARY
Glucose-Capillary: 163 mg/dL — ABNORMAL HIGH (ref 70–99)
Glucose-Capillary: 84 mg/dL (ref 70–99)
Glucose-Capillary: 135 mg/dL — ABNORMAL HIGH (ref 70–99)
Glucose-Capillary: 153 mg/dL — ABNORMAL HIGH (ref 70–99)
Glucose-Capillary: 138 mg/dL — ABNORMAL HIGH (ref 70–99)

## 2020-02-24 NOTE — Progress Notes (Signed)
PROGRESS NOTE  Javel [place] MBW:[id number] DOB: [birth year] DOA: 11/01/2019 PCP: Default, Provider, MD  HPI/Recap of past 26 hours: 55 year old male with HIV, chronic alcohol abuse who was presented to the emergency department after being found unresponsive outside a convinient store. EMS found him pulseless in PEA, unknown downtime, successfully resuscitated and brought to the emergency department. UDS positive for THC and benzodiazepines. CT head unremarkable. Failed extubation trials due to severe anoxic brain injury and had tracheostomy placed. PEG placed on 10/15.  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  This has led to significant pain as well.  [name] was consulted and recommendations/input/orders highly appreciated.  She may also benefit from Botox injections to both hips to aid in treatment of spasticity.  Significant Hospital Events   9/29 Admit post PEA arrest. UDS positive for THC, benzo's. ETOH 177 10/05 EEG ongoing. Versed restarted overnight due to agitation. Nicardipine increased.  10/06 Developed tachypnea with WUA, no follow commands off sedation  10/07 Vomiting, TF held / restarted with recurrent vomiting 10/16 tracheostomy collar for 9 hours 10/18->10/19 off vent all night  10/20> TC 11/15 > downsized to #4 Shiley flex CFS  02/24/20: Seen and examined.  No acute issues.  Does not appear in distress.    Assessment/Plan: Active Problems:   Cardiac arrest [facility])   Community acquired pneumonia of left upper lobe of lung   Anoxic brain injury ([place])   Alcohol abuse   Acute respiratory failure with hypoxia (HCC)   Vomiting   Pressure injury of skin   Small bowel obstruction (HCC)   Palliative care encounter   Bowel obstruction (HCC)   Chronic respiratory failure (HCC)   Diastolic dysfunction   Tracheostomy dependent (HCC)   Dysphagia   Protein-calorie  malnutrition (HCC)   Seizures (HCC)   Bilateral recurrent inguinal hernia   Muscle spasticity   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HCC)   Tracheobronchitis   Sequela of Corynebacterium infection   Abnormal urinalysis   Urinary tract infection due to Enterococcus   Fever  Anoxic brain injury 2/2 PEA arrest/pain syndrome secondary to hypertonicity/spastic tetraplegia:  -Presumed 2/2 combination alcohol and BZD overdose with UDS positive for THC and BZDs -Significant brain injury with underlying spastic tetraplegia long-term SNF placement recommended-continue PT/OT/SLP -Continue baclofen, gabapentin, scheduled oxycodone and Zanaflex  -Appreciate assistance of Dr. Swartz-12/27 recontacted via secure chat [name] regarding consideration/evaluation for Botox to right hamstrings, rectus femoris -Continue bilateral upper extremity WHO resting splints along with PROM per nursing staff every 4 hours -Continue low-dose Seroquel for brain injury sequela -Continue scheduled Tylenol for pain along with Crea muscle rub -Continue KREG rotational bed   Chronic respiratory failure with inability to maintain patent airway requiring chronic tracheostomy tube/Recent Corynebacterium tracheobronchitis (resolved) -Continue PMV training per SLP-#4 cuffless trach in place-trach changed out on 1/10 RT -Not a candidate for decannulation until patient can reliably follow commands, cough and clear airway and it appears current mentation will likely be patient's new baseline -continue physiotherapy with Vibra vest along with supervised PMV trials  Dysphagia 2/2 anoxic brain injury -Continue tube feeding per PEG -SLP attempted to introduce oral substances on 12/27 but patient did not accept and was pushing out with his tongue.  Given his acute UTI he may have been nauseated and this may have been reason for him not wishing to eat. -Patient also with some redness and exudate on tongue.  Recently treated for  oral thrush.  We will begin Magic mouthwash with lidocaine  Enterococcus faecalis UTI -Mental status changes as documented above on 12/27 did have T-max of 99.9 on 12/28 despite receiving scheduled Tylenol for pain -Urinalysis highly abnormal with regimental leukocytes, many bacteria and greater than 50 WBC for UTI -Urine culture positive for pansensitive Enterococcus-empiric Levaquin discontinued on 12/30 in favor of Augmentin and received a total of 5 days therapy  HIV: -CD4 count of 124 October 2021 (had been as high as 250 Dec 2020)-as of today up to 259 -Dc'd Tivicay and Descovy infavor of Biktarvy to for eventual dc to SNF; per my discussion with infectious disease physician [name] on 12/23 preferred regimen would be Descovy and Tivicay-she will contact the HIV pharmacist to attempt to obtain discount cards to make this a more affordable option.  Once this has been confirmed will transition back to Descovy and Tivicay -CD4 count on 12/23 up to 259 -ID recommends continuing prophylactic Bactrim an additional 3 months  Goals of care:  -Palliative care was involved during this hospitalization.  -Goals of care were discussed. remains full code.  -Palliative care recommended outpatient follow-up with palliative providers. -May need to revisit end-of-life goals of care since patient making poor progress and currently having significant pain related to hypertonicity from brain injury  Protein calorie malnutrition nutrition Status: Nutrition Problem: Increased nutrient needs Etiology: acute illness Signs/Symptoms: estimated needs Interventions: Tube feeding bolus doses of Osmolite, Juven twice daily, Prosource TF 45 mL 3 times daily Estimated body mass index is 25.47 kg/m as calculated from the following:   Height as of this encounter: _0  (1.676 m).   Weight as of this encounter: 71.6 kg.   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  -Continue Keppra; levetiracetam level 35.9 on 11/12 -no further seizure activity since transitioned out of ICU setting therefore will discontinue seizure precaution  Inguinal hernias -Evaluated by general surgery this admission. Documented as chronically incarcerated. Patient has been clinically stable and tolerating tube feedings and having bowel movements so unless patient obstructed would not be considered a candidate for repair. Even if obstructed consulting surgeon was not sure he would be a candidate for hernia repair regardless .  Status: Remains inpatient appropriate because:Altered mental status, Unsafe d/c plan and Inpatient level of care appropriate due to severity of illness. Patient newly diagnosed with anoxic brain injury  Dispo: The patient is from: Home  Anticipated d/c is to: SNF  Anticipated d/c date is: > 3 days  Patient currently is medically stable to d/c.  Barriers to discharge: No SNF bed offers.  Needs trach capable facility. TOC communicating with financial counseling team who have yet to hear back from DSS regarding patient's assigned caseworker.   Code Status: Full Family Communication: 12/29 dtr [name] updated DVT prophylaxis: Subcutaneous heparin 3 times daily Vaccination status: Covid vaccination status unknown  Foley catheter: No      Objective: Vitals:   02/24/20 0715 02/24/20 0801 02/24/20 1115 02/24/20 1221  BP:    125/79   Pulse:  80  Resp:    19  Temp: 98 F (36.7 C)   98.7 F (37.1 C)  TempSrc: Axillary     SpO2:  94% 96% 100%  Weight:      Height:        Intake/Output Summary (Last 24 hours) at 02/24/2020 1430 Last data filed at 02/23/2020 1702 Gross per 24 hour  Intake --  Output 500 ml  Net -500 ml   Filed Weights   02/13/20 0750 02/18/20 0500 02/19/20 0420  Weight: 70.5 kg 75.5 kg 73 kg    Exam: No significant changes from prior exam.  . General: 55 y.o. year-old male well developed well nourished in no acute distress.  Alert, nonverbal.  Appears comfortable. . Cardiovascular: Regular rate and rhythm with no rubs or gallops.  No thyromegaly or JVD noted.   Marland Kitchen Respiratory: Mild rales at bases no wheezing noted.  Poor inspiratory effort. . Abdomen: Soft nontender nondistended with normal bowel sounds x4 quadrants. . Musculoskeletal: No lower extremity edema bilaterally.   Marland Kitchen Psychiatry: Mood is appropriate for condition and setting   Data Reviewed: CBC: Recent Labs  Lab 02/18/20 0400  WBC 3.8*  NEUTROABS 2.3  HGB 14.4  HCT 44.2  MCV 93.6  PLT 659*   Basic Metabolic Panel: Recent Labs  Lab 02/18/20 0400  NA 143  K 3.9  CL 102  CO2 26  GLUCOSE 98  BUN 19  CREATININE 0.69  CALCIUM 10.6*  MG 1.8  PHOS 4.0   GFR: Estimated Creatinine Clearance: 94.1 mL/min (by C-G formula based on SCr of 0.69 mg/dL). Liver Function Tests: Recent Labs  Lab 02/18/20 0400  AST 24  ALT 16  ALKPHOS 107  BILITOT 0.5  PROT 7.8  ALBUMIN 3.4*   No results for input(s): LIPASE, AMYLASE in the last 168 hours. No results for input(s): AMMONIA in the last 168 hours. Coagulation Profile: No results for input(s): INR, PROTIME in the last 168 hours. Cardiac Enzymes: No results for input(s): CKTOTAL, CKMB, CKMBINDEX, TROPONINI in the last 168 hours. BNP (last 3 results) No results for input(s): PROBNP in the last 8760 hours. HbA1C: No results for input(s): HGBA1C in the last 72  hours. CBG: Recent Labs  Lab 02/23/20 1128 02/23/20 1700 02/23/20 1949 02/24/20 0824 02/24/20 1222  GLUCAP 87 89 136* 163* 84   Lipid Profile: No results for input(s): CHOL, HDL, LDLCALC, TRIG, CHOLHDL, LDLDIRECT in the last 72 hours. Thyroid Function Tests: No results for input(s): TSH, T4TOTAL, FREET4, T3FREE, THYROIDAB in the last 72 hours. Anemia Panel: No results for input(s): VITAMINB12, FOLATE, FERRITIN, TIBC, IRON, RETICCTPCT in the last 72 hours. Urine analysis:    Component Value Date/Time   COLORURINE YELLOW 02/15/2020 0124   APPEARANCEUR CLEAR 02/15/2020 0124   LABSPEC 1.017 02/15/2020 0124   PHURINE 5.0 02/15/2020 0124   GLUCOSEU NEGATIVE 02/15/2020 0124   HGBUR NEGATIVE 02/15/2020 0124   BILIRUBINUR NEGATIVE 02/15/2020 0124   KETONESUR NEGATIVE 02/15/2020 0124   PROTEINUR NEGATIVE 02/15/2020 0124   NITRITE NEGATIVE 02/15/2020 0124   LEUKOCYTESUR SMALL (A) 02/15/2020 0124   Sepsis Labs: _0 (procalcitonin:4,lacticidven:4)  ) Recent Results (from the past 240 hour(s))  Culture, blood (routine x 2)     Status: None   Collection Time: 02/14/20 10:42 PM   Specimen: BLOOD RIGHT ARM  Result Value Ref Range Status   Specimen Description BLOOD RIGHT ARM  Final   Special Requests   Final    BOTTLES DRAWN AEROBIC AND ANAEROBIC Blood Culture adequate  volume   Culture   Final    NO GROWTH 5 DAYS Performed at [facility], [place] [address], [place], [place] [address]    Report Status 02/20/2020 FINAL  Final  Culture, blood (routine x 2)     Status: None   Collection Time: 02/14/20 10:42 PM   Specimen: BLOOD RIGHT HAND  Result Value Ref Range Status   Specimen Description BLOOD RIGHT HAND  Final   Special Requests   Final    BOTTLES DRAWN AEROBIC AND ANAEROBIC Blood Culture adequate volume   Culture   Final    NO GROWTH 5 DAYS Performed at [facility], [place] [address] [place], [place] [address]    Report Status 02/20/2020 FINAL  Final  Resp  panel by RT-PCR (RSV, Flu A&B, Covid) Nasopharyngeal Swab     Status: None   Collection Time: 02/16/20  9:39 AM   Specimen: Nasopharyngeal Swab; Nasopharyngeal(NP) swabs in vial transport medium  Result Value Ref Range Status   SARS Coronavirus 2 by RT PCR NEGATIVE NEGATIVE Final    Comment: (NOTE) SARS-CoV-2 target nucleic acids are NOT DETECTED.  The SARS-CoV-2 RNA is generally detectable in upper respiratory specimens during the acute phase of infection. The lowest concentration of SARS-CoV-2 viral copies this assay can detect is 138 copies/mL. A negative result does not preclude SARS-Cov-2 infection and should not be used as the sole basis for treatment or other patient management decisions. A negative result may occur with  improper specimen collection/handling, submission of specimen other than nasopharyngeal swab, presence of viral mutation(s) within the areas targeted by this assay, and inadequate number of viral copies(<138 copies/mL). A negative result must be combined with clinical observations, patient history, and epidemiological information. The expected result is Negative.  Fact Sheet for Patients:  EntrepreneurPulse.com.au  Fact Sheet for Healthcare Providers:  IncredibleEmployment.be  This test is no t yet approved or cleared by the [place] FDA and  has been authorized for detection and/or diagnosis of SARS-CoV-2 by FDA under an Emergency Use Authorization (EUA). This EUA will remain  in effect (meaning this test can be used) for the duration of the COVID-19 declaration under Section 564(b)(1) of the Act, 21 U.S.C.section 360bbb-3(b)(1), unless the authorization is terminated  or revoked sooner.       Influenza A by PCR NEGATIVE NEGATIVE Final   Influenza B by PCR NEGATIVE NEGATIVE Final    Comment: (NOTE) The Xpert Xpress SARS-CoV-2/FLU/RSV plus assay is intended as an aid in the diagnosis of influenza from  Nasopharyngeal swab specimens and should not be used as a sole basis for treatment. Nasal washings and aspirates are unacceptable for Xpert Xpress SARS-CoV-2/FLU/RSV testing.  Fact Sheet for Patients: EntrepreneurPulse.com.au  Fact Sheet for Healthcare Providers: IncredibleEmployment.be  This test is not yet approved or cleared by the [place] FDA and has been authorized for detection and/or diagnosis of SARS-CoV-2 by FDA under an Emergency Use Authorization (EUA). This EUA will remain in effect (meaning this test can be used) for the duration of the COVID-19 declaration under Section 564(b)(1) of the Act, 21 U.S.C. section 360bbb-3(b)(1), unless the authorization is terminated or revoked.     Resp Syncytial Virus by PCR NEGATIVE NEGATIVE Final    Comment: (NOTE) Fact Sheet for Patients: EntrepreneurPulse.com.au  Fact Sheet for Healthcare Providers: IncredibleEmployment.be  This test is not yet approved or cleared by the [place] FDA and has been authorized for detection and/or diagnosis of SARS-CoV-2 by FDA under an  Emergency Use Authorization (EUA). This EUA will remain in effect (meaning this test can be used) for the duration of the COVID-19 declaration under Section 564(b)(1) of the Act, 21 U.S.C. section 360bbb-3(b)(1), unless the authorization is terminated or revoked.  Performed at [facility], [place] [address], [place], [place] [address]   MRSA PCR Screening     Status: None   Collection Time: 02/16/20 12:33 PM   Specimen: Nasal Mucosa; Nasopharyngeal  Result Value Ref Range Status   MRSA by PCR NEGATIVE NEGATIVE Final    Comment:        The GeneXpert MRSA Assay (FDA approved for NASAL specimens only), is one component of a comprehensive MRSA colonization surveillance program. It is not intended to diagnose MRSA infection nor to guide or monitor treatment for MRSA  infections. Performed at [facility], [place] [address], [place], [place] [address]       Studies: No results found.  Scheduled Meds: . acetaminophen (TYLENOL) oral liquid 160 mg/5 mL  650 mg Per Tube Q6H  . amLODipine  10 mg Per Tube Daily  . baclofen  30 mg Per Tube TID  . chlorhexidine gluconate (MEDLINE KIT)  15 mL Mouth Rinse BID  . Darunavir-Cobicisctat-Emtricitabine-Tenofovir Alafenamide  1 tablet Oral Q breakfast  . famotidine  10.4 mg Per Tube BID  . feeding supplement (OSMOLITE 1.5 CAL)  237 mL Per Tube Q24H  . feeding supplement (OSMOLITE 1.5 CAL)  474 mL Per Tube TID  . feeding supplement (PROSource TF)  45 mL Per Tube TID  . fiber  1 packet Per Tube BID  . folic acid  1 mg Per Tube Daily  . free water  200 mL Per Tube Q4H  . gabapentin  200 mg Per Tube Q8H  . heparin injection (subcutaneous)  5,000 Units Subcutaneous Q8H  . levETIRAcetam  1,500 mg Per Tube BID  . magic mouthwash w/lidocaine  5 mL Oral QID  . Muscle Rub   Topical QID  . nystatin  5 mL Oral QID  . oxyCODONE  5 mg Per Tube Q6H  . polyethylene glycol  17 g Per Tube QHS  . QUEtiapine  12.5 mg Per Tube QHS  . scopolamine  1 patch Transdermal Q72H  . sulfamethoxazole-trimethoprim  1 tablet Per Tube Once per day on Mon Wed Fri  . thiamine  100 mg Per Tube Daily  . tiZANidine  6 mg Per Tube Q6H    Continuous Infusions:   LOS: 115 days     Kayleen Memos, MD [facility] Pager [phone]  If 7PM-7AM, please contact night-coverage [email or web address] Password [facility] 02/24/2020, 2:30 PM

## 2020-02-25 LAB — GLUCOSE, CAPILLARY
Glucose-Capillary: 100 mg/dL — ABNORMAL HIGH (ref 70–99)
Glucose-Capillary: 100 mg/dL — ABNORMAL HIGH (ref 70–99)
Glucose-Capillary: 157 mg/dL — ABNORMAL HIGH (ref 70–99)
Glucose-Capillary: 206 mg/dL — ABNORMAL HIGH (ref 70–99)
Glucose-Capillary: 77 mg/dL (ref 70–99)
Glucose-Capillary: 92 mg/dL (ref 70–99)

## 2020-02-25 NOTE — Progress Notes (Signed)
PROGRESS NOTE  [place] VHQ:[id number] DOB: 1965/07/24 DOA: 11/01/2019 PCP: Default, Provider, MD  HPI/Recap of past 56 hours: 55 year old male with HIV, chronic alcohol abuse who was presented to the emergency department after being found unresponsive outside a convinient store. EMS found him pulseless in PEA, unknown downtime, successfully resuscitated and brought to the emergency department. UDS positive for THC and benzodiazepines. CT head unremarkable. Failed extubation trials due to severe anoxic brain injury and had tracheostomy placed. PEG placed on 10/15.  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  This has led to significant pain as well.  [name] was consulted and recommendations/input/orders highly appreciated.  She may also benefit from Botox injections to both hips to aid in treatment of spasticity.  Significant Hospital Events   9/29 Admit post PEA arrest. UDS positive for THC, benzo's. ETOH 177 10/05 EEG ongoing. Versed restarted overnight due to agitation. Nicardipine increased.  10/06 Developed tachypnea with WUA, no follow commands off sedation  10/07 Vomiting, TF held / restarted with recurrent vomiting 10/16 tracheostomy collar for 9 hours 10/18->10/19 off vent all night  10/20> TC 11/15 > downsized to #4 Shiley flex CFS  02/04/20: Seen and examined at his bedside.  No acute events.  Does not appear to be in distress.  Assessment/Plan: Active Problems:   Cardiac arrest [facility])   Community acquired pneumonia of left upper lobe of lung   Anoxic brain injury ([place])   Alcohol abuse   Acute respiratory failure with hypoxia (HCC)   Vomiting   Pressure injury of skin   Small bowel obstruction (HCC)   Palliative care encounter   Bowel obstruction (HCC)   Chronic respiratory failure (HCC)   Diastolic dysfunction   Tracheostomy dependent (HCC)   Dysphagia    Protein-calorie malnutrition (HCC)   Seizures (HCC)   Bilateral recurrent inguinal hernia   Muscle spasticity   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HCC)   Tracheobronchitis   Sequela of Corynebacterium infection   Abnormal urinalysis   Urinary tract infection due to Enterococcus   Fever  Anoxic brain injury 2/2 PEA arrest/pain syndrome secondary to hypertonicity/spastic tetraplegia:  -Presumed 2/2 combination alcohol and BZD overdose with UDS positive for THC and BZDs -Significant brain injury with underlying spastic tetraplegia long-term SNF placement recommended-continue PT/OT/SLP -Continue baclofen, gabapentin, scheduled oxycodone and Zanaflex  -Appreciate assistance of Dr. Swartz-12/27 recontacted via secure chat [name] regarding consideration/evaluation for Botox to right hamstrings, rectus femoris -Continue bilateral upper extremity WHO resting splints along with PROM per nursing staff every 4 hours -Continue low-dose Seroquel for brain injury sequela -Continue scheduled Tylenol for pain along with Crea muscle rub -Continue KREG rotational bed   Enterococcus faecalis UTI -Mental status changes as documented above on 12/27 did have T-max of 99.9 on 12/28 despite receiving scheduled Tylenol for pain -Urinalysis highly abnormal with regimental leukocytes, many bacteria and greater than 50 WBC for UTI -Urine culture positive for pansensitive Enterococcus-empiric Levaquin discontinued on 12/30 in favor of Augmentin and will receive a total of 5 days therapy  Acute hypoxemic respiratory failure/new tracheostomy tube requirement/Corynebacterium tracheobronchitis -Continue PMV training per SLP-per SLP did not do well with attempts to introduce oral substances12 /2;  #4 cuffless trach in place -Trach team signed off as of 12/27 given not a candidate for decannulation until patient can reliably follow commands, cough and clear airway. -Completed 7 days of linezolid for  Corynebacterium tracheobronchitis -continue albuterol nebs -continue physiotherapy with Vibra  vest along with supervised PMV trials -Follow-up chest x-ray on 12/24 with low lung volumes but no acute cardiopulmonary findings  HIV: -CD4 count of 124 October 2021 (had been as high as 250 Dec 2020)-as of today up to 259 -Dc'd Tivicay and Descovy infavor of Biktarvy to for eventual dc to SNF; per my discussion with infectious disease physician [name] on 12/23 preferred regimen would be Descovy and Tivicay-she will contact the HIV pharmacist to attempt to obtain discount cards to make this a more affordable option.  Once this has been confirmed will transition back to Descovy and Tivicay -CD4 count on 12/23 up to 259 -ID recommends continuing prophylactic Bactrim an additional 3 months  Dysphagia 2/2 anoxic brain injury -Continue tube feeding per PEG -Repeat SLP evaluation on 11/23 with recommendation for n.p.o. status; attempts to introduce oral substances on 12/27 but patient did not accept and was pushing out with his tongue.  Given his acute UTI he may have been nauseated and this may have been reason for him not wishing to eat.  Goals of care:  -Palliative care was involved during this hospitalization.  -Goals of care were discussed. remains full code.  -Palliative care recommended outpatient follow-up with palliative providers. -May need to revisit end-of-life goals of care since patient making poor progress and currently having significant pain related to hypertonicity from brain injury  Protein calorie malnutrition nutrition Status: Nutrition Problem: Increased nutrient needs Etiology: acute illness Signs/Symptoms: estimated needs Interventions: Tube feeding bolus doses of Osmolite, Juven twice daily, Prosource TF 45 mL 3 times daily Estimated body mass index is 25.47 kg/m as calculated from the following:   Height as of this encounter: _0  (1.676 m).   Weight as of this  encounter: 71.6 kg.   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  -Continue Keppra; levetiracetam level 35.9 on 11/12 -no further seizure activity since transitioned out of ICU setting therefore will discontinue seizure precaution  Inguinal hernias -Evaluated by general surgery this admission. Documented as chronically incarcerated. Patient has been clinically stable and tolerating tube feedings and having bowel movements so unless patient obstructed would not be considered a candidate for repair. Even if obstructed consulting surgeon was not sure he would be a candidate for hernia repair regardless .  Status: Remains inpatient appropriate because:Altered mental status, Unsafe d/c plan and Inpatient level of care appropriate due to severity of illness. Patient newly diagnosed with anoxic brain injury  Dispo: The patient is from: Home  Anticipated d/c is to: SNF  Anticipated d/c date is: > 3 days  Patient currently is medically stable to d/c.  Barriers to discharge: No SNF bed offers.  Needs trach capable facility. TOC communicating with financial counseling team who have yet to hear back from DSS regarding patient's assigned caseworker.   Code Status: Full Family Communication: 12/29 dtr [name] updated DVT prophylaxis: Subcutaneous heparin Vaccination status: Covid vaccination status unknown  Foley catheter:  No      Objective: Vitals:   02/25/20 0358 02/25/20 0846 02/25/20 1130  02/25/20 1551  BP:      Pulse:      Resp:      Temp:      TempSrc:      SpO2:  92% 98% 95%  Weight: 74.5 kg     Height:        Intake/Output Summary (Last 24 hours) at 02/25/2020 1632 Last data filed at 02/25/2020 0746 Gross per 24 hour  Intake --  Output 1150 ml  Net -1150 ml   Filed Weights   02/18/20 0500 02/19/20 0420 02/25/20 0358  Weight: 75.5 kg 73 kg 74.5 kg    Exam: No significant changes from prior exam.  . General: 55 y.o. year-old male well developed well nourished in no acute distress.  Alert, nonverbal. . Cardiovascular: Regular rate and rhythm with no rubs or gallops.  No thyromegaly or JVD noted.   Marland Kitchen Respiratory: Mild rales at bases no wheezing noted.  Poor inspiratory effort. . Abdomen: Soft nontender nondistended with normal bowel sounds x4 quadrants. . Musculoskeletal: No lower extremity edema bilaterally.   Marland Kitchen Psychiatry: Mood is appropriate for condition and setting   Data Reviewed: CBC: No results for input(s): WBC, NEUTROABS, HGB, HCT, MCV, PLT in the last 168 hours. Basic Metabolic Panel: No results for input(s): NA, K, CL, CO2, GLUCOSE, BUN, CREATININE, CALCIUM, MG, PHOS in the last 168 hours. GFR: Estimated Creatinine Clearance: 94.1 mL/min (by C-G formula based on SCr of 0.69 mg/dL). Liver Function Tests: No results for input(s): AST, ALT, ALKPHOS, BILITOT, PROT, ALBUMIN in the last 168 hours. No results for input(s): LIPASE, AMYLASE in the last 168 hours. No results for input(s): AMMONIA in the last 168 hours. Coagulation Profile: No results for input(s): INR, PROTIME in the last 168 hours. Cardiac Enzymes: No results for input(s): CKTOTAL, CKMB, CKMBINDEX, TROPONINI in the last 168 hours. BNP (last 3 results) No results for input(s): PROBNP in the last 8760 hours. HbA1C: No results for input(s): HGBA1C in the last 72 hours. CBG: Recent Labs  Lab 02/24/20 2300  02/25/20 0309 02/25/20 0742 02/25/20 1227 02/25/20 1618  GLUCAP 138* 100* 100* 157* 77   Lipid Profile: No results for input(s): CHOL, HDL, LDLCALC, TRIG, CHOLHDL, LDLDIRECT in the last 72 hours. Thyroid Function Tests: No results for input(s): TSH, T4TOTAL, FREET4, T3FREE, THYROIDAB in the last 72 hours. Anemia Panel: No results for input(s): VITAMINB12, FOLATE, FERRITIN, TIBC, IRON, RETICCTPCT in the last 72 hours. Urine analysis:    Component Value Date/Time   COLORURINE YELLOW 02/15/2020 0124   APPEARANCEUR CLEAR 02/15/2020 0124   LABSPEC 1.017 02/15/2020 0124   PHURINE 5.0 02/15/2020 0124   GLUCOSEU NEGATIVE 02/15/2020 0124   HGBUR NEGATIVE 02/15/2020 0124   BILIRUBINUR NEGATIVE 02/15/2020 0124   KETONESUR NEGATIVE 02/15/2020 0124   PROTEINUR NEGATIVE 02/15/2020 0124   NITRITE NEGATIVE 02/15/2020 0124   LEUKOCYTESUR SMALL (A) 02/15/2020 0124   Sepsis Labs: _0 (procalcitonin:4,lacticidven:4)  ) Recent Results (from the past 240 hour(s))  Resp panel by RT-PCR (RSV, Flu A&B, Covid) Nasopharyngeal Swab     Status: None   Collection Time: 02/16/20  9:39 AM   Specimen: Nasopharyngeal Swab; Nasopharyngeal(NP) swabs in vial transport medium  Result Value Ref Range Status   SARS Coronavirus 2 by RT PCR NEGATIVE NEGATIVE Final    Comment: (NOTE) SARS-CoV-2 target nucleic acids are NOT DETECTED.  The SARS-CoV-2 RNA is generally detectable in upper respiratory specimens during the acute phase of infection. The lowest concentration of SARS-CoV-2 viral copies this assay can detect is  138 copies/mL. A negative result does not preclude SARS-Cov-2 infection and should not be used as the sole basis for treatment or other patient management decisions. A negative result may occur with  improper specimen collection/handling, submission of specimen other than nasopharyngeal swab, presence of viral mutation(s) within the areas targeted by this assay, and inadequate number of  viral copies(<138 copies/mL). A negative result must be combined with clinical observations, patient history, and epidemiological information. The expected result is Negative.  Fact Sheet for Patients:  EntrepreneurPulse.com.au  Fact Sheet for Healthcare Providers:  IncredibleEmployment.be  This test is no t yet approved or cleared by the [place] FDA and  has been authorized for detection and/or diagnosis of SARS-CoV-2 by FDA under an Emergency Use Authorization (EUA). This EUA will remain  in effect (meaning this test can be used) for the duration of the COVID-19 declaration under Section 564(b)(1) of the Act, 21 U.S.C.section 360bbb-3(b)(1), unless the authorization is terminated  or revoked sooner.       Influenza A by PCR NEGATIVE NEGATIVE Final   Influenza B by PCR NEGATIVE NEGATIVE Final    Comment: (NOTE) The Xpert Xpress SARS-CoV-2/FLU/RSV plus assay is intended as an aid in the diagnosis of influenza from Nasopharyngeal swab specimens and should not be used as a sole basis for treatment. Nasal washings and aspirates are unacceptable for Xpert Xpress SARS-CoV-2/FLU/RSV testing.  Fact Sheet for Patients: EntrepreneurPulse.com.au  Fact Sheet for Healthcare Providers: IncredibleEmployment.be  This test is not yet approved or cleared by the [place] FDA and has been authorized for detection and/or diagnosis of SARS-CoV-2 by FDA under an Emergency Use Authorization (EUA). This EUA will remain in effect (meaning this test can be used) for the duration of the COVID-19 declaration under Section 564(b)(1) of the Act, 21 U.S.C. section 360bbb-3(b)(1), unless the authorization is terminated or revoked.     Resp Syncytial Virus by PCR NEGATIVE NEGATIVE Final    Comment: (NOTE) Fact Sheet for Patients: EntrepreneurPulse.com.au  Fact Sheet for Healthcare  Providers: IncredibleEmployment.be  This test is not yet approved or cleared by the [place] FDA and has been authorized for detection and/or diagnosis of SARS-CoV-2 by FDA under an Emergency Use Authorization (EUA). This EUA will remain in effect (meaning this test can be used) for the duration of the COVID-19 declaration under Section 564(b)(1) of the Act, 21 U.S.C. section 360bbb-3(b)(1), unless the authorization is terminated or revoked.  Performed at [facility], [place] [address] [place], [place] [address]   MRSA PCR Screening     Status: None   Collection Time: 02/16/20 12:33 PM   Specimen: Nasal Mucosa; Nasopharyngeal  Result Value Ref Range Status   MRSA by PCR NEGATIVE NEGATIVE Final    Comment:        The GeneXpert MRSA Assay (FDA approved for NASAL specimens only), is one component of a comprehensive MRSA colonization surveillance program. It is not intended to diagnose MRSA infection nor to guide or monitor treatment for MRSA infections. Performed at [facility], [place] [address] [place], [place] [address]       Studies: No results found.  Scheduled Meds: . acetaminophen (TYLENOL) oral liquid 160 mg/5 mL  650 mg Per Tube Q6H  . amLODipine  10 mg Per Tube Daily  . baclofen  30 mg Per Tube TID  . chlorhexidine gluconate (MEDLINE KIT)  15 mL Mouth Rinse BID  . Darunavir-Cobicisctat-Emtricitabine-Tenofovir Alafenamide  1 tablet Oral Q breakfast  . famotidine  10.4 mg Per Tube BID  . feeding supplement (OSMOLITE 1.5 CAL)  237 mL Per Tube Q24H  . feeding supplement (OSMOLITE 1.5 CAL)  474 mL Per Tube TID  . feeding supplement (PROSource TF)  45 mL Per Tube TID  . fiber  1 packet Per Tube BID  . folic acid  1 mg Per Tube Daily  . free water  200 mL Per Tube Q4H  . gabapentin  200 mg Per Tube Q8H  . heparin injection (subcutaneous)  5,000 Units Subcutaneous Q8H  . levETIRAcetam  1,500 mg Per Tube BID  . magic mouthwash  w/lidocaine  5 mL Oral QID  . Muscle Rub   Topical QID  . nystatin  5 mL Oral QID  . oxyCODONE  5 mg Per Tube Q6H  . polyethylene glycol  17 g Per Tube QHS  . QUEtiapine  12.5 mg Per Tube QHS  . scopolamine  1 patch Transdermal Q72H  . sulfamethoxazole-trimethoprim  1 tablet Per Tube Once per day on Mon Wed Fri  . thiamine  100 mg Per Tube Daily  . tiZANidine  6 mg Per Tube Q6H    Continuous Infusions:   LOS: 116 days     Kayleen Memos, MD [facility] Pager [phone]  If 7PM-7AM, please contact night-coverage [email or web address] Password [facility] 02/25/2020, 4:32 PM

## 2020-02-26 LAB — GLUCOSE, CAPILLARY
Glucose-Capillary: 90 mg/dL (ref 70–99)
Glucose-Capillary: 104 mg/dL — ABNORMAL HIGH (ref 70–99)
Glucose-Capillary: 137 mg/dL — ABNORMAL HIGH (ref 70–99)
Glucose-Capillary: 87 mg/dL (ref 70–99)
Glucose-Capillary: 96 mg/dL (ref 70–99)
Glucose-Capillary: 134 mg/dL — ABNORMAL HIGH (ref 70–99)

## 2020-02-26 NOTE — TOC Progression Note (Addendum)
Transition of Care [facility]) - Progression Note    Patient Details  Name: Ryan Orozco MRN: [id number] Date of Birth: [birth year]  [facility] CM/SW Contact  Curlene Labrum, RN Phone Number: 02/26/2020, 1:22 PM  Clinical Narrative:    Case management met with the patient at the bedside regarding transitions of care.  No SNf bed offers at this time, but many of the facilities are on hold due to staffing shortages or COVID infections exposure issues.  CM and MSW will continue to follow the patient for SNF admission.   Expected Discharge Plan: Skilled Nursing Facility Barriers to Discharge: Continued Medical Work up,SNF Pending bed offer  Expected Discharge Plan and Services Expected Discharge Plan: [place]   Discharge Planning Services: CM Consult Post Acute Care Choice: Nursing Home Living arrangements for the past 2 months: Single Family Home                                       Social Determinants of Health (SDOH) Interventions    Readmission Risk Interventions Readmission Risk Prevention Plan 02/06/2020  Transportation Screening Complete  PCP or Specialist Appt within 3-5 Days Complete  HRI or [place] Complete  Social Work Consult for [place] Planning/Counseling Complete  Palliative Care Screening Complete  Medication Review Press photographer) Complete  Some recent data might be hidden

## 2020-02-26 NOTE — Progress Notes (Addendum)
[facility] PROGRESS NOTE  Ryan Orozco BLT:[id number] DOB: 06/04/1965 DOA: 11/01/2019 PCP: Default, Provider, MD    11/16   Status: Remains inpatient appropriate because:Altered mental status, Unsafe d/c plan and Inpatient level of care appropriate due to severity of illness. Patient newly diagnosed with anoxic brain injury  Dispo: The patient is from: Home              Anticipated d/c is to: SNF              Anticipated d/c date is: > 3 days              Patient currently is medically stable to d/c.  Barriers to discharge: No SNF bed offers.  Needs trach capable facility. TOC communicating with financial counseling team who have yet to hear back from DSS regarding patient's assigned caseworker.  TOC spoke with [name] rehab.  Patient would be an excellent candidate but they request he have active Medicaid and disability before they can accept him.  Also faxed out information to [name] as of 1/10   Code Status: Full Family Communication: 1/24 dtr [name] updated DVT prophylaxis: Subcutaneous heparin Vaccination status: Will order first dose Pfizer COVID-vaccine on 1/10; next week we will order pneumococcal and influenza vaccinations  Foley catheter:  No  HPI: 55 year old male with HIV, chronic alcohol abuse who was presented to the emergency department after being found unresponsive outside a convinient store.  EMS found him pulseless in PEA, unknown downtime, successfully resuscitated and brought to the emergency department.  UDS positive for THC and benzodiazepines.  CT head unremarkable.  Failed extubation trials due to severe anoxic brain injury and had tracheostomy placed.  PEG placed on 10/15.  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  This has led to significant pain as well.  [name] was consulted and recommendations/input/orders highly appreciated.  She may also benefit  from Botox injections to both hips to aid in treatment of spasticity.   Significant Hospital Events   9/29 Admit post PEA arrest. UDS positive for THC, benzo's. ETOH 177 10/05 EEG ongoing. Versed restarted overnight due to agitation. Nicardipine increased.  10/06 Developed tachypnea with WUA, no follow commands off sedation  10/07 Vomiting, TF held / restarted with recurrent vomiting 10/16 tracheostomy collar for 9 hours 10/18->10/19 off vent all night  10/20> TC 11/15 > downsized to #4 Shiley flex CFS  Subjective: Was sleeping soundly.  Place stethoscope on patient's chest when he startled awake.  Repeatedly stating "high".  Becomes restless and wants head of bed elevated.  Is looking out a room.  Objective: Vitals:   02/26/20 0329 02/26/20 0620  BP:  113/77  Pulse: 82 80  Resp: 16 20  Temp:  98.8 F (37.1 C)  SpO2: 92% 95%    Intake/Output Summary (Last 24 hours) at 02/26/2020 0801 Last data filed at 02/25/2020 1848 Gross per 24 hour  Intake --  Output 1050 ml  Net -1050 ml   Filed Weights   02/19/20 0420 02/25/20 0358 02/26/20 0500  Weight: 73 kg 74.5 kg 71.5 kg    Exam: General: Awakens, no acute distress ENT: Tongue now appears pink after starting Magic mouthwash.  No lesions Pulmonary: Lung sounds clear to auscultation, no secretions noted from trach, trach collar FiO2 21%.   Cardiac: Normal heart sounds, extremities warm to touch with adequate capillary refill, pulse regular Abdomen: PEG tube .  LBM 1/13 Neurological: Severe hypertonicity/spasticity  bilateral lower extremities involving the hip flexors and hamstrings; primarily keeps bilateral hips flexed and any attempts to extend either leg especially the right leg results in significant pain response.  Limited use of upper extremities with action primarily initiated from the shoulder girth. Psychiatric:   Assessment/Plan: Acute problems: Anoxic brain injury 2/2 PEA arrest/pain syndrome secondary to  hypertonicity/spastic tetraplegia:  -Presumed 2/2 combination alcohol and BZD overdose with UDS positive for THC and BZDs -Significant brain injury with underlying spastic tetraplegia long-term SNF placement recommended-continue PT/OT/SLP -Continue baclofen, gabapentin, scheduled oxycodone and Zanaflex -as of 1/21 patient seemed to be having increasing pain so have increased frequency of scheduled oxycodone to every 6 hours -Appreciate assistance of [name] -1/6 Botox injection into the quadriceps and rectus femoris as well as the biceps femoris and hamstrings, semimembranosus and semitendinosus.  Unable to inject iliopsoas without EMG guidance. -Continue bilateral upper extremity WHO resting splints along with PROM per nursing staff every 4 hours -Continue low-dose Seroquel for brain injury sequela -Continue scheduled Tylenol for pain along with Crea muscle rub -Continue KREG rotational bed   Chronic respiratory failure with inability to maintain patent airway requiring chronic tracheostomy tube/Recent Corynebacterium tracheobronchitis (resolved) -Continue PMV training per SLP-#4 cuffless trach in place-trach changed out on 1/10 RT -Not a candidate for decannulation until patient can reliably follow commands, cough and clear airway and it appears current mentation will likely be patient's new baseline -continue physiotherapy with Vibra vest along with supervised PMV trials  HIV:  -CD4 count of 124 October 2021 (had been as high as 250 Dec 2020)-as of today up to 259 -Emesis recommends repeating viral load which has been ordered on 1/14 -Dc'd Tivicay and Descovy infavor of Biktarvy to for eventual dc to SNF; per my discussion with infectious disease physician [name] on 12/23 preferred regimen would be Descovy and Tivicay-she will contact the HIV pharmacist to attempt to obtain discount cards to make this a more affordable option.  Once this has been confirmed will transition back to Descovy  and Tivicay -CD4 count on 12/23 up to 259 -ID recommends continuing prophylactic Bactrim an additional 3 months  Dysphagia 2/2 anoxic brain injury -Continue tube feeding per PEG -SLP attempted to introduce oral substances on 12/27 but patient did not accept and was pushing out with his tongue.  Given his acute UTI he may have been nauseated and this may have been reason for him not wishing to eat. -Patient also with some redness and exudate on tongue.  Recently treated for oral thrush.  Began Magic mouthwash with lidocaine 1/21 with significant improvement in clinical exam  ?  Constipation -No documented bowel movement since 1/13 -We will continue scheduled Colace, fiber supplements per tube -Add daily MiraLAX -Given one-time dose milk of magnesia on 1/20-no documented bowel movement on flowsheet -Suspect requirement for narcotics and multiple muscle relaxers contributing to decreased GI motility and constipation symptoms  Protein calorie malnutrition nutrition Status: Nutrition Problem: Increased nutrient needs Etiology: acute illness Signs/Symptoms: estimated needs Interventions: Tube feeding bolus doses of Osmolite, Juven twice daily, Prosource TF 45 mL 3 times daily Estimated body mass index is 25.44 kg/m as calculated from the following:   Height as of this encounter: 5' 6"  (1.676 m).   Weight as of this encounter: 71.5 kg.   Multiple decubiti not POA Wound / Incision (Open or Dehisced) 11/17/19 Puncture Abdomen Left;Anterior;Upper G-tube insertion site  (Active)  Date First Assessed/Time First Assessed: 11/17/19 1646   Wound Type: Puncture  Location: Abdomen  Location Orientation: Left;Anterior;Upper  Wound Description (Comments): G-tube insertion site   Present on Admission: No    Assessments 11/17/2019  4:47 PM 02/20/2020  7:37 AM  Dressing Type Gauze (Comment);Tape dressing Gauze (Comment)  Dressing Changed New --  Dressing Status Clean;Dry;Intact --  Dressing Change  Frequency PRN --  Site / Wound Assessment Clean;Dry;Pink --  Peri-wound Assessment Intact --  Closure None --  Drainage Amount None --  Treatment Cleansed --     No Linked orders to display      Other problems: Hypertension/grade 1 diastolic dysfunction:  -Continue amlodipine  -Echocardiogram this admission with preserved LVEF with evidence of grade   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   -Continue Keppra; levetiracetam level 35.9 on 11/12 -no further seizure activity since transitioned out of ICU setting therefore will discontinue seizure precaution  SIRS/Fever 2/2 recurrent enterococcus UTI -Patient developed mild elevation in temperature overnight to 1/13 associated with slight increase in respiratory secretions and congested sounding cough/abnormal pulmonary exam as well as tachycardia./14 respiratory status much improved and no further fevers. -Respiratory viral panel neg for COVID, influenza and RSV.   -UA slightly abnormal/cx c/w enterococcus UTI- started on nitrofurantoin (prior UTI treated w/ augmentin) has completed 3 days of treatment -X-ray with bilateral opacities concerning for early pneumonia but CT of the chest without pneumonia, broad-spectrum antibiotics dc'd 1/14 -Because of dehydration increased frequency of free water from 200 cc every 4 hours to every 2 hours on 1/13- stopped on 1/15  Inguinal hernias -Evaluated by general surgery this admission. Documented as chronically incarcerated. Patient has been clinically stable and tolerating tube feedings and having bowel movements so unless patient obstructed would not be considered a candidate for repair. Even if obstructed consulting surgeon was not sure he would be a candidate for hernia repair regardless  Goals of care:  -Palliative care was involved during this hospitalization.   -Goals of care were discussed. remains full code.  -Palliative care recommended outpatient follow-up with  palliative providers. -May need to revisit end-of-life goals of care since patient making poor progress and currently having significant pain related to hypertonicity from brain injury   Data Reviewed: Basic Metabolic Panel: No results for input(s): NA, K, CL, CO2, GLUCOSE, BUN, CREATININE, CALCIUM, MG, PHOS in the last 168 hours. Liver Function Tests: No results for input(s): AST, ALT, ALKPHOS, BILITOT, PROT, ALBUMIN in the last 168 hours. No results for input(s): LIPASE, AMYLASE in the last 168 hours. No results for input(s): AMMONIA in the last 168 hours. CBC: No results for input(s): WBC, NEUTROABS, HGB, HCT, MCV, PLT in the last 168 hours. Cardiac Enzymes: No results for input(s): CKTOTAL, CKMB, CKMBINDEX, TROPONINI in the last 168 hours. BNP (last 3 results) Recent Labs    02/15/20 0433  BNP 3.9    ProBNP (last 3 results) No results for input(s): PROBNP in the last 8760 hours.  CBG: Recent Labs  Lab 02/25/20 1227 02/25/20 1618 02/25/20 1920 02/25/20 2347 02/26/20 0354  GLUCAP 157* 77 206* 92 90    Recent Results (from the past 240 hour(s))  Resp panel by RT-PCR (RSV, Flu A&B, Covid) Nasopharyngeal Swab     Status: None   Collection Time: 02/16/20  9:39 AM   Specimen: Nasopharyngeal Swab; Nasopharyngeal(NP) swabs in vial transport medium  Result Value Ref Range Status   SARS Coronavirus 2 by RT PCR NEGATIVE NEGATIVE Final    Comment: (NOTE) SARS-CoV-2 target nucleic acids are NOT DETECTED.  The  SARS-CoV-2 RNA is generally detectable in upper respiratory specimens during the acute phase of infection. The lowest concentration of SARS-CoV-2 viral copies this assay can detect is 138 copies/mL. A negative result does not preclude SARS-Cov-2 infection and should not be used as the sole basis for treatment or other patient management decisions. A negative result may occur with  improper specimen collection/handling, submission of specimen other than nasopharyngeal  swab, presence of viral mutation(s) within the areas targeted by this assay, and inadequate number of viral copies(<138 copies/mL). A negative result must be combined with clinical observations, patient history, and epidemiological information. The expected result is Negative.  Fact Sheet for Patients:  EntrepreneurPulse.com.au  Fact Sheet for Healthcare Providers:  IncredibleEmployment.be  This test is no t yet approved or cleared by the [place] FDA and  has been authorized for detection and/or diagnosis of SARS-CoV-2 by FDA under an Emergency Use Authorization (EUA). This EUA will remain  in effect (meaning this test can be used) for the duration of the COVID-19 declaration under Section 564(b)(1) of the Act, 21 U.S.C.section 360bbb-3(b)(1), unless the authorization is terminated  or revoked sooner.       Influenza A by PCR NEGATIVE NEGATIVE Final   Influenza B by PCR NEGATIVE NEGATIVE Final    Comment: (NOTE) The Xpert Xpress SARS-CoV-2/FLU/RSV plus assay is intended as an aid in the diagnosis of influenza from Nasopharyngeal swab specimens and should not be used as a sole basis for treatment. Nasal washings and aspirates are unacceptable for Xpert Xpress SARS-CoV-2/FLU/RSV testing.  Fact Sheet for Patients: EntrepreneurPulse.com.au  Fact Sheet for Healthcare Providers: IncredibleEmployment.be  This test is not yet approved or cleared by the [place] FDA and has been authorized for detection and/or diagnosis of SARS-CoV-2 by FDA under an Emergency Use Authorization (EUA). This EUA will remain in effect (meaning this test can be used) for the duration of the COVID-19 declaration under Section 564(b)(1) of the Act, 21 U.S.C. section 360bbb-3(b)(1), unless the authorization is terminated or revoked.     Resp Syncytial Virus by PCR NEGATIVE NEGATIVE Final    Comment: (NOTE) Fact Sheet for  Patients: EntrepreneurPulse.com.au  Fact Sheet for Healthcare Providers: IncredibleEmployment.be  This test is not yet approved or cleared by the [place] FDA and has been authorized for detection and/or diagnosis of SARS-CoV-2 by FDA under an Emergency Use Authorization (EUA). This EUA will remain in effect (meaning this test can be used) for the duration of the COVID-19 declaration under Section 564(b)(1) of the Act, 21 U.S.C. section 360bbb-3(b)(1), unless the authorization is terminated or revoked.  Performed at [facility], [place] [address], [place], [place] [address]   MRSA PCR Screening     Status: None   Collection Time: 02/16/20 12:33 PM   Specimen: Nasal Mucosa; Nasopharyngeal  Result Value Ref Range Status   MRSA by PCR NEGATIVE NEGATIVE Final    Comment:        The GeneXpert MRSA Assay (FDA approved for NASAL specimens only), is one component of a comprehensive MRSA colonization surveillance program. It is not intended to diagnose MRSA infection nor to guide or monitor treatment for MRSA infections. Performed at [facility], [place] [address] [place], [place] [address]      Studies: No results found.  Scheduled Meds: . acetaminophen (TYLENOL) oral liquid 160 mg/5 mL  650 mg Per Tube Q6H  . amLODipine  10 mg Per Tube Daily  . baclofen  30 mg Per Tube TID  .  chlorhexidine gluconate (MEDLINE KIT)  15 mL Mouth Rinse BID  . Darunavir-Cobicisctat-Emtricitabine-Tenofovir Alafenamide  1 tablet Oral Q breakfast  . famotidine  10.4 mg Per Tube BID  . feeding supplement (OSMOLITE 1.5 CAL)  237 mL Per Tube Q24H  . feeding supplement (OSMOLITE 1.5 CAL)  474 mL Per Tube TID  . feeding supplement (PROSource TF)  45 mL Per Tube TID  . fiber  1 packet Per Tube BID  . folic acid  1 mg Per Tube Daily  . free water  200 mL Per Tube Q4H  . gabapentin  200 mg Per Tube Q8H  . heparin injection (subcutaneous)  5,000 Units  Subcutaneous Q8H  . levETIRAcetam  1,500 mg Per Tube BID  . magic mouthwash w/lidocaine  5 mL Oral QID  . Muscle Rub   Topical QID  . nystatin  5 mL Oral QID  . oxyCODONE  5 mg Per Tube Q6H  . polyethylene glycol  17 g Per Tube QHS  . QUEtiapine  12.5 mg Per Tube QHS  . scopolamine  1 patch Transdermal Q72H  . sulfamethoxazole-trimethoprim  1 tablet Per Tube Once per day on Mon Wed Fri  . thiamine  100 mg Per Tube Daily  . tiZANidine  6 mg Per Tube Q6H   Continuous Infusions:   Active Problems:   Cardiac arrest [facility])   Community acquired pneumonia of left upper lobe of lung   Anoxic brain injury ([place])   Alcohol abuse   Acute respiratory failure with hypoxia (HCC)   Vomiting   Pressure injury of skin   Small bowel obstruction (HCC)   Palliative care encounter   Bowel obstruction (HCC)   Chronic respiratory failure (HCC)   Diastolic dysfunction   Tracheostomy dependent (HCC)   Dysphagia   Protein-calorie malnutrition (HCC)   Seizures (HCC)   Bilateral recurrent inguinal hernia   Muscle spasticity   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HCC)   Tracheobronchitis   Sequela of Corynebacterium infection   Abnormal urinalysis   Urinary tract infection due to Enterococcus   Fever   Consultants:  PCCM  Palliative medicine  General surgery  Neurology  Interventional radiology  Procedures:  EEG  Echocardiogram  Tracheostomy  Antibiotics: Anti-infectives (From admission, onward)   Start     Dose/Rate Route Frequency Ordered Stop   02/17/20 1100  nitrofurantoin (FURADANTIN) 25 MG/5ML suspension 100 mg        100 mg Per Tube Every 12 hours 02/17/20 1006 02/20/20 0924   02/15/20 1000  vancomycin (VANCOCIN) IVPB 1000 mg/200 mL premix  Status:  Discontinued        1,000 mg 200 mL/hr over 60 Minutes Intravenous Every 12 hours 02/14/20 2226 02/16/20 0742   02/14/20 2245  piperacillin-tazobactam (ZOSYN) IVPB 3.375 g  Status:  Discontinued        3.375 g 12.5  mL/hr over 240 Minutes Intravenous Every 8 hours 02/14/20 2226 02/16/20 0742   02/14/20 2245  vancomycin (VANCOREADY) IVPB 1500 mg/300 mL        1,500 mg 150 mL/hr over 120 Minutes Intravenous  Once 02/14/20 2226 02/15/20 0739   02/01/20 2200  amoxicillin-clavulanate (AUGMENTIN) 250-62.5 MG/5ML suspension 500 mg        500 mg Per Tube Every 8 hours 02/01/20 1343 02/06/20 1333   01/31/20 1015  levofloxacin (LEVAQUIN) 25 MG/ML solution 500 mg  Status:  Discontinued        500 mg Per Tube Daily 01/31/20 0919 02/01/20 1343   01/31/20  1000  cefTRIAXone (ROCEPHIN) 1 g in sodium chloride 0.9 % 100 mL IVPB  Status:  Discontinued        1 g 200 mL/hr over 30 Minutes Intravenous Every 24 hours 01/31/20 0905 01/31/20 0919   01/19/20 2200  linezolid (ZYVOX) tablet 600 mg        600 mg Per Tube Every 12 hours 01/19/20 1534 01/25/20 2019   01/19/20 1100  levofloxacin (LEVAQUIN) tablet 500 mg  Status:  Discontinued        500 mg Per Tube Daily 01/19/20 1004 01/19/20 1534   12/19/19 0930  Darunavir-Cobicisctat-Emtricitabine-Tenofovir Alafenamide (SYMTUZA) 800-150-200-10 MG TABS 1 tablet        1 tablet Oral Daily with breakfast 12/19/19 0840     12/13/19 1000  bictegravir-emtricitabine-tenofovir AF (BIKTARVY) 50-200-25 MG per tablet 1 tablet  Status:  Discontinued        1 tablet Oral Daily 12/12/19 1413 12/19/19 0840   11/17/19 1548  ceFAZolin (ANCEF) 2-4 GM/100ML-% IVPB       Note to Pharmacy: Domenick Bookbinder   : cabinet override      11/17/19 1548 11/18/19 0359   11/16/19 1515  ceFAZolin (ANCEF) IVPB 2g/100 mL premix        2 g 200 mL/hr over 30 Minutes Intravenous To Radiology 11/16/19 1511 11/17/19 1625   11/15/19 0900  sulfamethoxazole-trimethoprim (BACTRIM DS) 800-160 MG per tablet 1 tablet        1 tablet Per Tube Once per day on Mon Wed Fri 11/13/19 1906     11/13/19 1615  dolutegravir (TIVICAY) tablet 50 mg  Status:  Discontinued        50 mg Oral Daily 11/13/19 1521 12/12/19 1413   11/13/19  1615  emtricitabine-tenofovir AF (DESCOVY) 200-25 MG per tablet 1 tablet  Status:  Discontinued        1 tablet Per Tube Daily 11/13/19 1521 12/12/19 1413   11/13/19 1530  sulfamethoxazole-trimethoprim (BACTRIM DS) 800-160 MG per tablet 1 tablet  Status:  Discontinued        1 tablet Oral Once per day on Mon Wed Fri 11/13/19 1441 11/13/19 1906   11/13/19 1500  bictegravir-emtricitabine-tenofovir AF (BIKTARVY) 50-200-25 MG per tablet 1 tablet  Status:  Discontinued        1 tablet Oral Daily 11/13/19 1403 11/13/19 1521   11/10/19 1000  erythromycin 250 mg in sodium chloride 0.9 % 100 mL IVPB        250 mg 100 mL/hr over 60 Minutes Intravenous Every 8 hours 11/10/19 0907 11/11/19 2000   11/07/19 1200  ceFEPIme (MAXIPIME) 2 g in sodium chloride 0.9 % 100 mL IVPB        2 g 200 mL/hr over 30 Minutes Intravenous Every 8 hours 11/07/19 1013 11/13/19 2127   11/03/19 1200  cefTRIAXone (ROCEPHIN) 2 g in sodium chloride 0.9 % 100 mL IVPB        2 g 200 mL/hr over 30 Minutes Intravenous Every 24 hours 11/03/19 0922 11/05/19 1427   11/02/19 0800  vancomycin (VANCOREADY) IVPB 750 mg/150 mL  Status:  Discontinued        750 mg 150 mL/hr over 60 Minutes Intravenous Every 12 hours 11/01/19 1935 11/02/19 1105   11/02/19 0600  piperacillin-tazobactam (ZOSYN) IVPB 3.375 g  Status:  Discontinued        3.375 g 12.5 mL/hr over 240 Minutes Intravenous Every 8 hours 11/02/19 0311 11/03/19 0920   11/01/19 2200  ceFEPIme (MAXIPIME) 2 g in sodium  chloride 0.9 % 100 mL IVPB  Status:  Discontinued        2 g 200 mL/hr over 30 Minutes Intravenous Every 12 hours 11/01/19 2143 11/02/19 0311   11/01/19 1915  vancomycin (VANCOREADY) IVPB 1500 mg/300 mL        1,500 mg 150 mL/hr over 120 Minutes Intravenous  Once 11/01/19 1909 11/01/19 2147   11/01/19 1845  cefTRIAXone (ROCEPHIN) 1 g in sodium chloride 0.9 % 100 mL IVPB        1 g 200 mL/hr over 30 Minutes Intravenous  Once 11/01/19 1842 11/01/19 2051   11/01/19 1845   azithromycin (ZITHROMAX) 500 mg in sodium chloride 0.9 % 250 mL IVPB        500 mg 250 mL/hr over 60 Minutes Intravenous  Once 11/01/19 1842 11/01/19 2051       Time spent: 20 minutes    Erin Hearing ANP  [facility]  7 am-330 pm/M-F for direct patient care and secure chat Please refer to [place] for contact information 02/26/2020, 8:01 AM  LOS: 117 days

## 2020-02-26 NOTE — Plan of Care (Signed)
  Problem: Respiratory: Goal: Patent airway maintenance will improve Outcome: Progressing   Problem: Respiratory: Goal: Will regain and/or maintain adequate ventilation Outcome: Progressing   Problem: Skin Integrity: Goal: Risk for impaired skin integrity will decrease Outcome: Progressing   Problem: Nutritional: Goal: Risk of aspiration will decrease Outcome: Progressing

## 2020-02-26 NOTE — Progress Notes (Signed)
Nutrition Follow-up  DOCUMENTATION CODES:   Not applicable  INTERVENTION:  Continue bolus TF regimen via PEG: -Provide 2 cartons (424m) Osmolite 1.5 TID @ 0800, 1200, 1700 -Provide 1 carton (2326m Osmolite 1.5 once daily at bedtime @ 2100 -Flush with 6021mree water before and after each tube feeding bolus -Provide 70m73mosource TF TID -Provide Nutrisource fiber BID -Additional free water flushes currently 200ml75m  Tube feeding regimen provides 2605 kcals, 137 grams protein,1267ml 8m water (2947ml w65mflushes)  NUTRITION DIAGNOSIS:   Increased nutrient needs related to acute illness as evidenced by estimated needs. -- ongoing  GOAL:   Patient will meet greater than or equal to 90% of their needs -- met with TF  MONITOR:   Labs,Weight trends,TF tolerance,Skin,I & O's  REASON FOR ASSESSMENT:   Ventilator,Consult Enteral/tube feeding initiation and management  ASSESSMENT:   Pt admitted with anoxic brain injury 2/2 PEA arrest/pain syndrome 2/2 hypertonicity/spastic tetraplegia (presumed to be 2/2 combination of EtOH and BZD overdose with UDS positive for THC and BZDs). PMH includes HIV and EtOH use.  09/29 - intubated 10/07- vomiting, TF held, laterrestarted with recurrent vomiting 10/12-R/L inguinal hernia, partial SBO  10/13- trach 10/15-PEG 10/16 - tolerated TC for 9 hours 10/18->10/19 - off vent overnight 10/20- TC 11/15 - trach downsized to #4 Shiley flex CFS 12/06 - trach changed (same #4 Shiley flex CFS) 1/11- trach changed (same #4 Shiley flex CFS)  Pt sleeping at time of RD visit. Pt remains NPO. Tolerating bolus feedings via PEG per RN. Current TF regimen is as follows: 2 cartons (474ml) O76mite 1.5 TID, 1 carton (237ml) Os70mte 1.5 once daily, 60ml free12mer flush before and after each tube feeding bolus + additional FWF of 200ml Q4H, 58m Prosou46mTF TID and Nutrisource fiber BID. Continue with current regimen. Pt pending d/c to SNF.    UOP: 2200ml x24 hou51mMedications: pepcid, nutrisource fiber BID, folic acid, miralax, thiamine Labs: Corrected Ca 11.08 (H), CBGs 90-104  Diet Order:   Diet Order    None      EDUCATION NEEDS:   Not appropriate for education at this time  Skin:  Skin Assessment: Skin Integrity Issues: Skin Integrity Issues:: Other (Comment) Stage II: N/A Stage III: N/A Other: puncture abdomen (PEG insertion site)  Last BM:  1/22  Height:   Ht Readings from Last 1 Encounters:  11/01/19 _0  (1.676 m)    Weight:   Wt Readings from Last 1 Encounters:  02/26/20 71.5 kg    BMI:  Body mass index is 25.44 kg/m.  Estimated Nutritional Needs:   Kcal:  2400-2600  Protein:  120-135 grams  Fluid:  >2L/d  Ryan Orozco AveretLarkin Orozco RD pager number and weekend/on-call pager number located in [email or web address].[email or web address]

## 2020-02-27 LAB — GLUCOSE, CAPILLARY
Glucose-Capillary: 92 mg/dL (ref 70–99)
Glucose-Capillary: 114 mg/dL — ABNORMAL HIGH (ref 70–99)
Glucose-Capillary: 169 mg/dL — ABNORMAL HIGH (ref 70–99)
Glucose-Capillary: 79 mg/dL (ref 70–99)
Glucose-Capillary: 219 mg/dL — ABNORMAL HIGH (ref 70–99)

## 2020-02-27 NOTE — Plan of Care (Signed)
  Problem: Elimination: Goal: Will not experience complications related to bowel motility Outcome: Progressing   Problem: Elimination: Goal: Will not experience complications related to urinary retention Outcome: Progressing   Problem: Pain Managment: Goal: General experience of comfort will improve Outcome: Progressing   Problem: Safety: Goal: Ability to remain free from injury will improve Outcome: Progressing   Problem: Skin Integrity: Goal: Risk for impaired skin integrity will decrease Outcome: Not Progressing  Skin tear around penis base

## 2020-02-27 NOTE — Progress Notes (Signed)
PROGRESS NOTE  Trev [place] MCN:[id number] DOB: 02/13/1965 DOA: 11/01/2019 PCP: Default, Provider, MD  HPI/Recap of past 70 hours: 55 year old male with HIV, chronic alcohol abuse who was presented to the emergency department after being found unresponsive outside a convinient store. EMS found him pulseless in PEA, unknown downtime, successfully resuscitated and brought to the emergency department. UDS positive for THC and benzodiazepines. CT head unremarkable. Failed extubation trials due to severe anoxic brain injury and had tracheostomy placed. PEG placed on 10/15.  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  This has led to significant pain as well.  [name] was consulted and recommendations/input/orders highly appreciated.  She may also benefit from Botox injections to both hips to aid in treatment of spasticity.  Significant Hospital Events   9/29 Admit post PEA arrest. UDS positive for THC, benzo's. ETOH 177 10/05 EEG ongoing. Versed restarted overnight due to agitation. Nicardipine increased.  10/06 Developed tachypnea with WUA, no follow commands off sedation  10/07 Vomiting, TF held / restarted with recurrent vomiting 10/16 tracheostomy collar for 9 hours 10/18->10/19 off vent all night  10/20> TC 11/15 > downsized to #4 Shiley flex CFS  02/27/20: Seen and examined at his bedside.  No acute events.  Does not appear to be in distress.  Awaiting bed placement.  Assessment/Plan: Active Problems:   Cardiac arrest [facility])   Community acquired pneumonia of left upper lobe of lung   Anoxic brain injury ([place])   Alcohol abuse   Acute respiratory failure with hypoxia ([facility])   Vomiting   Pressure injury of skin   Small bowel obstruction (HCC)   Palliative care encounter   Bowel obstruction (HCC)   Chronic respiratory failure (HCC)   Diastolic dysfunction   Tracheostomy dependent (HCC)    Dysphagia   Protein-calorie malnutrition (HCC)   Seizures (HCC)   Bilateral recurrent inguinal hernia   Muscle spasticity   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HCC)   Tracheobronchitis   Sequela of Corynebacterium infection   Abnormal urinalysis   Urinary tract infection due to Enterococcus   Fever  Anoxic brain injury 2/2 PEA arrest/pain syndrome secondary to hypertonicity/spastic tetraplegia:  -Presumed 2/2 combination alcohol and BZD overdose with UDS positive for THC and BZDs -Significant brain injury with underlying spastic tetraplegia long-term SNF placement recommended-continue PT/OT/SLP -Continue baclofen, gabapentin, scheduled oxycodone and Zanaflex  -Appreciate assistance of Dr. Swartz-12/27 recontacted via secure chat [name] regarding consideration/evaluation for Botox to right hamstrings, rectus femoris -Continue bilateral upper extremity WHO resting splints along with PROM per nursing staff every 4 hours -Continue low-dose Seroquel for brain injury sequela -Continue scheduled Tylenol for pain along with Crea muscle rub -Continue KREG rotational bed   Enterococcus faecalis UTI -Mental status changes as documented above on 12/27 did have T-max of 99.9 on 12/28 despite receiving scheduled Tylenol for pain -Urinalysis highly abnormal with regimental leukocytes, many bacteria and greater than 50 WBC for UTI -Urine culture positive for pansensitive Enterococcus-empiric Levaquin discontinued on 12/30 in favor of Augmentin and will receive a total of 5 days therapy  Acute hypoxemic respiratory failure/new tracheostomy tube requirement/Corynebacterium tracheobronchitis -Continue PMV training per SLP-per SLP did not do well with attempts to introduce oral substances12 /2;  #4 cuffless trach in place -Trach team signed off as of 12/27 given not a candidate for decannulation until patient can reliably follow commands, cough and clear airway. -Completed 7 days of  linezolid for Corynebacterium tracheobronchitis -continue albuterol nebs -  continue physiotherapy with Vibra vest along with supervised PMV trials -Follow-up chest x-ray on 12/24 with low lung volumes but no acute cardiopulmonary findings  HIV: -CD4 count of 124 October 2021 (had been as high as 250 Dec 2020)-as of today up to 259 -Dc'd Tivicay and Descovy infavor of Biktarvy to for eventual dc to SNF; per my discussion with infectious disease physician [name] on 12/23 preferred regimen would be Descovy and Tivicay-she will contact the HIV pharmacist to attempt to obtain discount cards to make this a more affordable option.  Once this has been confirmed will transition back to Descovy and Tivicay -CD4 count on 12/23 up to 259 -ID recommends continuing prophylactic Bactrim an additional 3 months  Dysphagia 2/2 anoxic brain injury -Continue tube feeding per PEG -Repeat SLP evaluation on 11/23 with recommendation for n.p.o. status; attempts to introduce oral substances on 12/27 but patient did not accept and was pushing out with his tongue.  Given his acute UTI he may have been nauseated and this may have been reason for him not wishing to eat.  Goals of care:  -Palliative care was involved during this hospitalization.  -Goals of care were discussed. remains full code.  -Palliative care recommended outpatient follow-up with palliative providers. -May need to revisit end-of-life goals of care since patient making poor progress and currently having significant pain related to hypertonicity from brain injury  Protein calorie malnutrition nutrition Status: Nutrition Problem: Increased nutrient needs Etiology: acute illness Signs/Symptoms: estimated needs Interventions: Tube feeding bolus doses of Osmolite, Juven twice daily, Prosource TF 45 mL 3 times daily Estimated body mass index is 25.47 kg/m as calculated from the following:   Height as of this encounter: _0  (1.676 m).   Weight  as of this encounter: 71.6 kg.   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  -Continue Keppra; levetiracetam level 35.9 on 11/12 -no further seizure activity since transitioned out of ICU setting therefore will discontinue seizure precaution  Inguinal hernias -Evaluated by general surgery this admission. Documented as chronically incarcerated. Patient has been clinically stable and tolerating tube feedings and having bowel movements so unless patient obstructed would not be considered a candidate for repair. Even if obstructed consulting surgeon was not sure he would be a candidate for hernia repair regardless .  Status: Remains inpatient appropriate because:Altered mental status, Unsafe d/c plan and Inpatient level of care appropriate due to severity of illness. Patient newly diagnosed with anoxic brain injury  Dispo: The patient is from: Home  Anticipated d/c is to: SNF  Anticipated d/c date is: > 3 days  Patient currently is medically stable to d/c.  Barriers to discharge: No SNF bed offers.  Needs trach capable facility. TOC communicating with financial counseling team who have yet to hear back from DSS regarding patient's assigned caseworker.   Code Status: Full Family Communication: 12/29 dtr [name] updated DVT prophylaxis: Subcutaneous heparin Vaccination status: Covid vaccination status unknown  Foley catheter:  No      Objective: Vitals:   02/27/20 0410  02/27/20 0817 02/27/20 1142 02/27/20 1538  BP: 115/76     Pulse: 76 92  90  Resp: _0 Temp: 98 F (36.7 C)     TempSrc: Oral     SpO2: 96% 97% 98% 97%  Weight:      Height:        Intake/Output Summary (Last 24 hours) at 02/27/2020 1631 Last data filed at 02/27/2020 1532 Gross per 24 hour  Intake 734 ml  Output 1710 ml  Net -976 ml   Filed Weights   02/25/20 0358 02/26/20 0500 02/27/20 0325  Weight: 74.5 kg 71.5 kg 73 kg    Exam: No significant changes from prior exam.  . General: 55 y.o. year-old male well developed well nourished in no acute distress.  Alert, nonverbal. . Cardiovascular: Regular rate and rhythm with no rubs or gallops.  No thyromegaly or JVD noted.   Marland Kitchen Respiratory: Mild rales at bases no wheezing noted.  Poor inspiratory effort. . Abdomen: Soft nontender nondistended with normal bowel sounds x4 quadrants. . Musculoskeletal: No lower extremity edema bilaterally.   Marland Kitchen Psychiatry: Mood is appropriate for condition and setting   Data Reviewed: CBC: No results for input(s): WBC, NEUTROABS, HGB, HCT, MCV, PLT in the last 168 hours. Basic Metabolic Panel: No results for input(s): NA, K, CL, CO2, GLUCOSE, BUN, CREATININE, CALCIUM, MG, PHOS in the last 168 hours. GFR: Estimated Creatinine Clearance: 94.1 mL/min (by C-G formula based on SCr of 0.69 mg/dL). Liver Function Tests: No results for input(s): AST, ALT, ALKPHOS, BILITOT, PROT, ALBUMIN in the last 168 hours. No results for input(s): LIPASE, AMYLASE in the last 168 hours. No results for input(s): AMMONIA in the last 168 hours. Coagulation Profile: No results for input(s): INR, PROTIME in the last 168 hours. Cardiac Enzymes: No results for input(s): CKTOTAL, CKMB, CKMBINDEX, TROPONINI in the last 168 hours. BNP (last 3 results) No results for input(s): PROBNP in the last 8760 hours. HbA1C: No results for input(s): HGBA1C in the last 72  hours. CBG: Recent Labs  Lab 02/26/20 2254 02/27/20 0310 02/27/20 0727 02/27/20 1147 02/27/20 1612  GLUCAP 134* 92 114* 169* 79   Lipid Profile: No results for input(s): CHOL, HDL, LDLCALC, TRIG, CHOLHDL, LDLDIRECT in the last 72 hours. Thyroid Function Tests: No results for input(s): TSH, T4TOTAL, FREET4, T3FREE, THYROIDAB in the last 72 hours. Anemia Panel: No results for input(s): VITAMINB12, FOLATE, FERRITIN, TIBC, IRON, RETICCTPCT in the last 72 hours. Urine analysis:    Component Value Date/Time   COLORURINE YELLOW 02/15/2020 0124   APPEARANCEUR CLEAR 02/15/2020 0124   LABSPEC 1.017 02/15/2020 0124   PHURINE 5.0 02/15/2020 0124   GLUCOSEU NEGATIVE 02/15/2020 0124   HGBUR NEGATIVE 02/15/2020 0124   BILIRUBINUR NEGATIVE 02/15/2020 0124   KETONESUR NEGATIVE 02/15/2020 0124   PROTEINUR NEGATIVE 02/15/2020 0124   NITRITE NEGATIVE 02/15/2020 0124   LEUKOCYTESUR SMALL (A) 02/15/2020 0124   Sepsis Labs: _1 (procalcitonin:4,lacticidven:4)  ) No results found for this or any previous visit (from the past 240 hour(s)).    Studies: No results found.  Scheduled Meds: . acetaminophen (TYLENOL) oral liquid 160 mg/5 mL  650 mg Per Tube Q6H  . amLODipine  10 mg Per Tube Daily  . baclofen  30 mg Per Tube TID  . chlorhexidine gluconate (MEDLINE KIT)  15 mL Mouth Rinse BID  . Darunavir-Cobicisctat-Emtricitabine-Tenofovir Alafenamide  1 tablet Oral Q breakfast  . famotidine  10.4 mg Per Tube BID  . feeding supplement (OSMOLITE 1.5 CAL)  237 mL Per Tube Q24H  . feeding supplement (OSMOLITE 1.5 CAL)  474 mL Per Tube TID  . feeding supplement (PROSource TF)  45 mL Per Tube TID  . fiber  1 packet Per Tube BID  . folic acid  1 mg Per Tube Daily  . free water  200 mL Per Tube Q4H  . gabapentin  200 mg Per Tube Q8H  . heparin injection (subcutaneous)  5,000 Units Subcutaneous Q8H  . levETIRAcetam  1,500 mg Per Tube BID  . magic mouthwash w/lidocaine  5 mL Oral QID  .  Muscle Rub   Topical QID  . oxyCODONE  5 mg Per Tube Q6H  . polyethylene glycol  17 g Per Tube QHS  . QUEtiapine  12.5 mg Per Tube QHS  . scopolamine  1 patch Transdermal Q72H  . sulfamethoxazole-trimethoprim  1 tablet Per Tube Once per day on Mon Wed Fri  . thiamine  100 mg Per Tube Daily  . tiZANidine  6 mg Per Tube Q6H    Continuous Infusions:   LOS: 118 days     Kayleen Memos, MD [facility] Pager [phone]  If 7PM-7AM, please contact night-coverage [email or web address] Password [facility] 02/27/2020, 4:31 PM

## 2020-02-27 NOTE — Progress Notes (Signed)
Occupational Therapy Treatment/Discharge Patient Details Name: Ryan Orozco MRN: [id number] DOB: 01-04-1966 Today's Date: 02/27/2020    History of present illness 55 year old male found down outside convenience store 9/27. Pt with PEA and hypoxic brain injury. Trach 10/13, PEG 10/15. PMHx: HIV AIDS and alcohol abuse.  CIR physician consulted for spacticity management and s/p botox injections 02/08/20. COVID (-) 02/15/19.   OT comments  On entry, pt with B resting hand splints donned and with appropriate fit (RN had donned splints earlier today). Pt also noted to be sweating, so room temp, bed linen adjusted and Total A to wash face with cool washcloth. Pt with inconsistent following of commands (ex."close eyes") during task. Printed additional splint wear/care sign in pt's room and coordinated with RN on wearing schedule and hygiene of hands/splint. No further skilled OT services indicated at this time as pt total care with all tasks, unable to follow commands consistently, and splint schedule adequate. OT to sign off at this time. Please reconsult if change in status or splints become ill-fitting.    Follow Up Recommendations  SNF;LTACH;Supervision/Assistance - 24 hour    Equipment Recommendations  Hospital bed;Wheelchair (measurements OT);Wheelchair cushion (measurements OT)    Recommendations for Other Services      Precautions / Restrictions Precautions Precautions: Fall Precaution Comments: trach, PEG, anoxic, flexor tone R>L LE/UE Required Braces or Orthoses: Other Brace Other Brace: bilateral resting hand splints Restrictions Weight Bearing Restrictions: No       Mobility Bed Mobility                  Transfers                      Balance                                           ADL either performed or assessed with clinical judgement   ADL Overall ADL's : Needs assistance/impaired     Grooming: Wash/dry face;Total  assistance;Bed level Grooming Details (indicate cue type and reason): Pt noted to be sweating on entry, washed face with cool washcloth. Attempted to get pt to close eyes with verbal cues, inconsistent and closed eyes when washcloth close to face - reflex                               General ADL Comments: Remains Total A for ADLs     Vision   Vision Assessment?: No apparent visual deficits   Perception     Praxis      Cognition Arousal/Alertness: Awake/alert Behavior During Therapy: Restless Overall Cognitive Status: Difficult to assess Area of Impairment: Following commands;Problem solving               Rancho Levels of Cognitive Functioning [place]ant Scales of Cognitive Functioning: Localized response       Following Commands: Follows one step commands inconsistently Safety/Judgement: Decreased awareness of deficits;Decreased awareness of safety Awareness: Intellectual Problem Solving: Slow processing;Decreased initiation;Difficulty sequencing;Requires verbal cues;Requires tactile cues          Exercises     Shoulder Instructions       General Comments Resting hand splints donned to B UE and noted to fit well. Repositioning L thumb in proper place, but pt with hx of frequent athetoid movements so likely slipped  out of place. Printed splint wear/care sheet with focus on hand hygiene and appropriate cleaning of splints. Coordinated with RN who assisted in donning splints and initiated hygiene. Plans to continue passing this info along in report.    Pertinent Vitals/ Pain       Pain Assessment: Faces Faces Pain Scale: Hurts little more Pain Location: grimace with positioning and stretching Pain Descriptors / Indicators: Grimacing;Guarding Pain Intervention(s): Monitored during session;Repositioned  Home Living                                          Prior Functioning/Environment              Frequency            Progress Toward Goals  OT Goals(current goals can now be found in the care plan section)  Progress towards OT goals: Not progressing toward goals - comment (DC therapy)  Acute Rehab OT Goals Patient Stated Goal: unable to state OT Goal Formulation: Patient unable to participate in goal setting Time For Goal Achievement: 03/04/20 Potential to Achieve Goals: Fair  Plan Other (comment) (Total A for all tasks, splint education complete. DC therapy)    Co-evaluation                 AM-PAC OT "6 Clicks" Daily Activity     Outcome Measure   Help from another person eating meals?: Total Help from another person taking care of personal grooming?: Total Help from another person toileting, which includes using toliet, bedpan, or urinal?: Total Help from another person bathing (including washing, rinsing, drying)?: Total Help from another person to put on and taking off regular upper body clothing?: Total Help from another person to put on and taking off regular lower body clothing?: Total 6 Click Score: 6    End of Session Equipment Utilized During Treatment: Oxygen  OT Visit Diagnosis: Cognitive communication deficit (R41.841);Muscle weakness (generalized) (M62.81);Pain;Hemiplegia and hemiparesis Symptoms and signs involving cognitive functions: Other cerebrovascular disease Hemiplegia - Right/Left: Right Hemiplegia - dominant/non-dominant: Dominant Hemiplegia - caused by: Other cerebrovascular disease Pain - Right/Left: Right Pain - part of body: Shoulder;Arm;Hand;Leg   Activity Tolerance Patient tolerated treatment well   Patient Left in bed;with call bell/phone within reach   Nurse Communication Other (comment) (Splint wear, pt sweating)        Time: 1610-9604 OT Time Calculation (min): 16 min  Charges: OT General Charges $OT Visit: 1 Visit OT Treatments $Therapeutic Activity: 8-22 mins  Lorre Munroe, OTR/L   Lorre Munroe 02/27/2020, 2:16  PM

## 2020-02-27 NOTE — Plan of Care (Signed)
  Problem: Education: Goal: Knowledge of General Education information will improve Description Including pain rating scale, medication(s)/side effects and non-pharmacologic comfort measures Outcome: Progressing   

## 2020-02-27 NOTE — Plan of Care (Signed)
  Problem: Education: Goal: Knowledge of General Education information will improve Description: Including pain rating scale, medication(s)/side effects and non-pharmacologic comfort measures 02/27/2020 1211 by Ross Ludwig, LPN Outcome: Progressing 02/27/2020 1211 by Ross Ludwig, LPN Outcome: Progressing

## 2020-02-28 LAB — GLUCOSE, CAPILLARY
Glucose-Capillary: 100 mg/dL — ABNORMAL HIGH (ref 70–99)
Glucose-Capillary: 114 mg/dL — ABNORMAL HIGH (ref 70–99)
Glucose-Capillary: 96 mg/dL (ref 70–99)
Glucose-Capillary: 113 mg/dL — ABNORMAL HIGH (ref 70–99)
Glucose-Capillary: 109 mg/dL — ABNORMAL HIGH (ref 70–99)

## 2020-02-28 NOTE — Plan of Care (Signed)
  Problem: Education: Goal: Knowledge of General Education information will improve Description Including pain rating scale, medication(s)/side effects and non-pharmacologic comfort measures Outcome: Progressing   

## 2020-02-28 NOTE — Progress Notes (Signed)
During morning assessment, yellowish/greenish purulent drainage noted around peg tube site at base. Cleansed and did dressing change. MD notified. See new orders.

## 2020-02-28 NOTE — Progress Notes (Addendum)
PROGRESS NOTE  Ryan Orozco TJQ:[id number] DOB: [birth year] DOA: 11/01/2019 PCP: Default, Provider, MD  HPI/Recap of past 50 hours: 55 year old male with HIV, chronic alcohol abuse who was presented to the emergency department after being found unresponsive outside a convinient store. EMS found him pulseless in PEA, unknown downtime, successfully resuscitated and brought to the emergency department. UDS positive for THC and benzodiazepines. CT head unremarkable. Failed extubation trials due to severe anoxic brain injury and had tracheostomy placed. PEG placed on 10/15.  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 This has led to significant pain as well. [name] was consulted with recommendations/input/orders   he may also benefit from Botox injections to both hips to aid in treatment of spasticity.  Subjective: February 28, 2020 Patient seen and examined at bedside he was quiet when regarding upon getting into the room he started making groaning sounds he is nonverbal, but try to make sounds as if to get an attention at the say something as a way of communicating: He has a tracheostomy he is contracted Nurse reported that his PEG tube area was having from previous discharge.   Assessment/Plan: Active Problems:   Cardiac arrest [facility])   Community acquired pneumonia of left upper lobe of lung   Anoxic brain injury ([place])   Alcohol abuse   Acute respiratory failure with hypoxia (HCC)   Vomiting   Pressure injury of skin   Small bowel obstruction (HCC)   Palliative care encounter   Bowel obstruction (HCC)   Chronic respiratory failure (HCC)   Diastolic dysfunction   Tracheostomy dependent (HCC)   Dysphagia   Protein-calorie malnutrition (HCC)   Seizures (HCC)   Bilateral recurrent inguinal hernia   Muscle spasticity   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HCC)   Tracheobronchitis    Sequela of Corynebacterium infection   Abnormal urinalysis   Urinary tract infection due to Enterococcus   Fever  Anoxic brain injury 2/2 PEA arrest/pain syndrome secondary to hypertonicity/spastic tetraplegia:  -Presumed 2/2 combination alcohol and BZD overdose with UDS positive for THC and BZDs -Significant brain injury with underlying spastic tetraplegia long-term SNF placement recommended-continue PT/OT/SLP -Continue baclofen, gabapentin, scheduled oxycodone and Zanaflex  May need to reconsult [name] regarding consideration/evaluation for Botox to right hamstrings, rectus femoris -Continue bilateral upper extremity WHO resting splints along with PROM per nursing staff every 4 hours -Continue low-dose Seroquel for brain injury sequela -Continue scheduled Tylenol for pain along with Crea muscle rub -Continue KREG rotational bed  Enterococcus faecalisUTI -Mental status changes as documented above on 12/27 did have T-max of 99.9 on 12/28 despite receiving scheduled Tylenol for pain -Urinalysis highly abnormal with regimental leukocytes, many bacteria and greater than 50 WBC for UTI -Urine culturepositive for pansensitive Enterococcus-empiric Levaquin discontinued on 12/30 in favor of Augmentin and completed for a total of 5 days therapy.  Acute hypoxemic respiratory failure/new tracheostomy tube requirement/Corynebacterium tracheobronchitis -Continue PMV training per SLP-per SLP did not do well with attempts to introduce oral substances12 /2;#4 cuffless trach in place -Trach team signed off as of 12/27 given not a candidate for decannulation until patient can reliably follow commands, cough and clear airway. -Completed 7 days of linezolid forCorynebacteriumtracheobronchitis -continue albuterol nebs -continue physiotherapy with Vibra vest along with supervised PMV trials -Follow-up chest x-ray on 12/24 with low lung volumes but no acute cardiopulmonary findings  HIV: -CD4  count of 124 October 2021(had been as high as 250 Dec 2020)-as of today up  to 26 -Dc'd Tivicay and Descovy infavor of Biktarvy to for eventual dc to SNF;per my discussion with infectious disease physician [name] on 12/23 preferred regimen would be Descovy and Tivicay-she will contact the HIV pharmacist to attempt to obtain discount cards to make this a more affordable option. Once this has been confirmed will transition back to Descovy and Tivicay -CD4 count on 12/23 up to 259 -ID recommends continuing prophylactic Bactrim an additional 3 months  Dysphagia 2/2 anoxic brain injury -Continue tube feeding per PEG -Repeat SLP evaluation on 11/23 with recommendation for n.p.o. status; attempts to introduce oral substances on 12/27 but patient did not accept and was pushing out with his tongue. Given his acute UTI he may have been nauseated and this may have been reason for him not wishing to eat.  Goals of care:  -Palliative care was involved during this hospitalization.  -Goals of care were discussed. remains full code.  -Palliative care recommended outpatient follow-up with palliative providers. -May need to revisit end-of-life goals of care since patient making poor progress and currently having significant pain related to hypertonicity from brain injury  Protein calorie malnutrition nutrition Status: Nutrition Problem: Increased nutrient needs Etiology: acute illness Signs/Symptoms: estimated needs Interventions: Tube feedingbolus doses of Osmolite, Juven twice daily, Prosource TF 45 mL 3 times daily Estimated body mass index is 25.47 kg/m as calculated from the following: Height as of this encounter: 5' 6"  (1.676 m). Weight as of this encounter: 71.6 kg.  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  -Continue Keppra; levetiracetam level 35.9 on 11/12 -no further seizure activity since transitioned out of ICU setting therefore will discontinue seizure precaution  Inguinal hernias -Evaluated by general surgery this admission. Documented as chronically incarcerated. Patient has been clinically stable and tolerating tube feedings and having bowel movements so unless patient obstructed would not be considered a candidate for repair. Even if obstructed consulting surgeon was not sure he would be a candidate for hernia repair regardless .   Code Status: Full  Severity of Illness: The appropriate patient status for this patient is INPATIENT. Inpatient status is judged to be reasonable and necessary in order to provide the required intensity of service to ensure the patient's safety. The patient's presenting symptoms, physical exam findings, and initial radiographic and laboratory data in the context of their chronic comorbidities is felt to place them at high risk for further clinical deterioration. Furthermore, it is not anticipated that the patient will be medically stable for discharge from the hospital within 2 midnights of admission. The following factors support the patient status of inpatient.   Remains inpatient appropriate because:Altered mental status, Unsafe d/c plan and Inpatient level of care appropriate due to severity of illness. Patient newly diagnosed with anoxic brain injury  * I certify that at the point of admission it is my clinical judgment that the patient will require inpatient hospital care spanning beyond 2  midnights from the point of admission due to high intensity of service, high risk for further deterioration and high frequency of surveillance required.*  Family Communication: None at bedside  Disposition Plan: SNF.  Medically stable to be discharged but no bed offers available yet caseworker and social worker continue to work with DSS   Consultants:  Physical medicine  Procedures:  Tracheostomy collar November 18, 2019   PEG tube placement November 17, 2019  Antimicrobials:  None  DVT prophylaxis: Subacute heparin   Objective: Vitals:   02/27/20 1538 02/27/20 2143 02/28/20 0453 02/28/20 0756  BP:  134/65 122/73   Pulse: 90 92 76 (!) 112  Resp: 18 18 20 18   Temp:  98.2 F (36.8 C) 98.8 F (37.1 C)   TempSrc:      SpO2: 97% 94% 96% 97%  Weight:      Height:        Intake/Output Summary (Last 24 hours) at 02/28/2020 0827 Last data filed at 02/28/2020 0512 Gross per 24 hour  Intake --  Output 1750 ml  Net -1750 ml   Filed Weights   02/25/20 0358 02/26/20 0500 02/27/20 0325  Weight: 74.5 kg 71.5 kg 73 kg   Body mass index is 25.98 kg/m.  Exam:  . General: 55 y.o. year-old male well developed well nourished in no acute distress.  Alert . A grunting sound groaning sounds . Cardiovascular: Regular rate and rhythm with no rubs or gallops.  No thyromegaly or JVD noted.    Respiratory: Clear to auscultation with no wheezes or rales. Good inspiratory effort.Tracheal collar in place .  Marland Kitchen Abdomen: Soft nontender nondistended with normal bowel sounds x4 quadrants.  PEG tube in place . Musculoskeletal: Contracted all 4 extremities . Skin: Mild  drainage to the PEG site area, . Psychiatry: Mood is appropriate for condition and setting    Data Reviewed: CBC: No results for input(s): WBC, NEUTROABS, HGB, HCT, MCV, PLT in the last 168 hours. Basic Metabolic Panel: No results for input(s): NA, K, CL, CO2, GLUCOSE, BUN, CREATININE, CALCIUM, MG, PHOS in the last 168  hours. GFR: Estimated Creatinine Clearance: 94.1 mL/min (by C-G formula based on SCr of 0.69 mg/dL). Liver Function Tests: No results for input(s): AST, ALT, ALKPHOS, BILITOT, PROT, ALBUMIN in the last 168 hours. No results for input(s): LIPASE, AMYLASE in the last 168 hours. No results for input(s): AMMONIA in the last 168 hours. Coagulation Profile: No results for input(s): INR, PROTIME in the last 168 hours. Cardiac Enzymes: No results for input(s): CKTOTAL, CKMB, CKMBINDEX, TROPONINI in the last 168 hours. BNP (last 3 results) No results for input(s): PROBNP in the last 8760 hours. HbA1C: No results for input(s): HGBA1C in the last 72 hours. CBG: Recent Labs  Lab 02/27/20 1612 02/27/20 1931 02/28/20 0135 02/28/20 0450 02/28/20 0804  GLUCAP 79 219* 100* 114* 96   Lipid Profile: No results for input(s): CHOL, HDL, LDLCALC, TRIG, CHOLHDL, LDLDIRECT in the last 72 hours. Thyroid Function Tests: No results for input(s): TSH, T4TOTAL, FREET4, T3FREE, THYROIDAB in the last 72 hours. Anemia Panel: No results for input(s): VITAMINB12, FOLATE, FERRITIN, TIBC, IRON, RETICCTPCT in the last 72 hours. Urine analysis:    Component Value Date/Time   COLORURINE YELLOW 02/15/2020 0124   APPEARANCEUR CLEAR 02/15/2020 0124   LABSPEC 1.017 02/15/2020 0124   PHURINE 5.0 02/15/2020 0124   GLUCOSEU NEGATIVE 02/15/2020 0124   HGBUR NEGATIVE 02/15/2020 0124   BILIRUBINUR NEGATIVE 02/15/2020 0124   KETONESUR NEGATIVE 02/15/2020 0124   PROTEINUR NEGATIVE 02/15/2020 0124   NITRITE NEGATIVE 02/15/2020 0124   LEUKOCYTESUR SMALL (A) 02/15/2020 0124   Sepsis Labs: @LABRCNTIP (procalcitonin:4,lacticidven:4)  )  No results found for this or any previous visit (from the past 240 hour(s)).    Studies: No results found.  Scheduled Meds: . acetaminophen (TYLENOL) oral liquid 160 mg/5 mL  650 mg Per Tube Q6H  . amLODipine  10 mg Per Tube Daily  . baclofen  30 mg Per Tube TID  . chlorhexidine  gluconate (MEDLINE KIT)  15 mL Mouth Rinse BID  . Darunavir-Cobicisctat-Emtricitabine-Tenofovir Alafenamide  1 tablet Oral Q breakfast  . famotidine  10.4 mg Per Tube BID  . feeding supplement (OSMOLITE 1.5 CAL)  237 mL Per Tube Q24H  . feeding supplement (OSMOLITE 1.5 CAL)  474 mL Per Tube TID  . feeding supplement (PROSource TF)  45 mL Per Tube TID  . fiber  1 packet Per Tube BID  . folic acid  1 mg Per Tube Daily  . free water  200 mL Per Tube Q4H  . gabapentin  200 mg Per Tube Q8H  . heparin injection (subcutaneous)  5,000 Units Subcutaneous Q8H  . levETIRAcetam  1,500 mg Per Tube BID  . magic mouthwash w/lidocaine  5 mL Oral QID  . Muscle Rub   Topical QID  . oxyCODONE  5 mg Per Tube Q6H  . polyethylene glycol  17 g Per Tube QHS  . QUEtiapine  12.5 mg Per Tube QHS  . scopolamine  1 patch Transdermal Q72H  . sulfamethoxazole-trimethoprim  1 tablet Per Tube Once per day on Mon Wed Fri  . thiamine  100 mg Per Tube Daily  . tiZANidine  6 mg Per Tube Q6H    Continuous Infusions:   LOS: 119 days     Cristal Deer, MD [facility]  To reach me or the doctor on call, go to: [email or web address] Password [email or web address]  02/28/2020, 8:27 AM

## 2020-02-28 NOTE — Progress Notes (Signed)
While checking evening vitals, a temp of 100.9 was noted. MD at bedside and made aware.

## 2020-02-29 LAB — GLUCOSE, CAPILLARY
Glucose-Capillary: 103 mg/dL — ABNORMAL HIGH (ref 70–99)
Glucose-Capillary: 135 mg/dL — ABNORMAL HIGH (ref 70–99)
Glucose-Capillary: 89 mg/dL (ref 70–99)
Glucose-Capillary: 97 mg/dL (ref 70–99)
Glucose-Capillary: 99 mg/dL (ref 70–99)

## 2020-02-29 NOTE — Progress Notes (Signed)
[facility] PROGRESS NOTE  Ferrell [place] PFX:[id number] DOB: May 14, 1965 DOA: 11/01/2019 PCP: Default, Provider, MD    11/16   Status: Remains inpatient appropriate because:Altered mental status, Unsafe d/c plan and Inpatient level of care appropriate due to severity of illness. Patient newly diagnosed with anoxic brain injury  Dispo: The patient is from: Home              Anticipated d/c is to: SNF              Anticipated d/c date is: > 3 days              Patient currently is medically stable to d/c.  Barriers to discharge: No SNF bed offers.  Needs trach capable facility. TOC communicating with financial counseling team who have yet to hear back from DSS regarding patient's assigned caseworker.  TOC spoke with [facility] rehab.  Patient would be an excellent candidate but they request he have active Medicaid and disability before they can accept him.  Also faxed out information to [facility] as of 1/10   Code Status: Full Family Communication: 1/24 dtr [name] updated DVT prophylaxis: Subcutaneous heparin Vaccination status: Will order first dose Pfizer COVID-vaccine on 1/10; next week we will order pneumococcal and influenza vaccinations  Foley catheter:  No  HPI: 55 year old male with HIV, chronic alcohol abuse who was presented to the emergency department after being found unresponsive outside a convinient store.  EMS found him pulseless in PEA, unknown downtime, successfully resuscitated and brought to the emergency department.  UDS positive for THC and benzodiazepines.  CT head unremarkable.  Failed extubation trials due to severe anoxic brain injury and had tracheostomy placed.  PEG placed on 10/15.  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  This has led to significant pain as well.  [name] was consulted and recommendations/input/orders highly appreciated.  She may also benefit  from Botox injections to both hips to aid in treatment of spasticity.   Significant Hospital Events   9/29 Admit post PEA arrest. UDS positive for THC, benzo's. ETOH 177 10/05 EEG ongoing. Versed restarted overnight due to agitation. Nicardipine increased.  10/06 Developed tachypnea with WUA, no follow commands off sedation  10/07 Vomiting, TF held / restarted with recurrent vomiting 10/16 tracheostomy collar for 9 hours 10/18->10/19 off vent all night  10/20> TC 11/15 > downsized to #4 Shiley flex CFS  Subjective: Awake, verbally communicating with the words hey and yes.  Made eye contact as well.  Smiled broadly during stomach examination when stomach was gently massaged.  Objective: Vitals:   02/29/20 0423 02/29/20 0754  BP: 121/73   Pulse: 71 81  Resp: 17 20  Temp: 98.7 F (37.1 C)   SpO2: (!) 9% 94%   No intake or output data in the 24 hours ending 02/29/20 0806 Filed Weights   02/25/20 0358 02/26/20 0500 02/27/20 0325  Weight: 74.5 kg 71.5 kg 73 kg    Exam: General: Awake, alert, calm Pulmonary:  trach collar FiO2 21%.   Cardiac:  Abdomen: PEG tube .  LBM 1/13 Neurological: Severe hypertonicity/spasticity bilateral lower extremities involving the hip flexors and hamstrings; primarily keeps bilateral hips flexed and any attempts to extend either leg especially the right leg results in significant pain response.  Limited use of upper extremities with action primarily initiated from the shoulder girth. Psychiatric:   Assessment/Plan: Acute problems: Anoxic brain injury 2/2 PEA arrest/pain syndrome secondary to  hypertonicity/spastic tetraplegia:  -Presumed 2/2 combination alcohol and BZD overdose with UDS positive for THC and BZDs -Significant brain injury with underlying spastic tetraplegia long-term SNF placement recommended-continue PT/OT/SLP -Continue baclofen, gabapentin, scheduled oxycodone and Zanaflex -as of 1/21 patient seemed to be having increasing pain so  have increased frequency of scheduled oxycodone to every 6 hours -Appreciate assistance of [name] -1/6 Botox injection into the quadriceps and rectus femoris as well as the biceps femoris and hamstrings, semimembranosus and semitendinosus.  Unable to inject iliopsoas without EMG guidance. -Continue bilateral upper extremity WHO resting splints along with PROM per nursing staff every 4 hours -Continue low-dose Seroquel for brain injury sequela -Continue scheduled Tylenol for pain along with Crea muscle rub -Continue KREG rotational bed   Chronic respiratory failure with inability to maintain patent airway requiring chronic tracheostomy tube/Recent Corynebacterium tracheobronchitis (resolved) -Continue PMV training per SLP-#4 cuffless trach in place-trach changed out on 1/10 RT -Given altered mentation and inability to manage secretions PCCM states patient is not a candidate for elective decannulation -continue physiotherapy with Vibra vest along with supervised PMV trials  HIV:  -CD4 count of 124 October 2021 (had been as high as 250 Dec 2020)-as of today up to 259 -Emesis recommends repeating viral load which has been ordered on 1/14 -Dc'd Tivicay and Descovy infavor of Biktarvy to for eventual dc to SNF; per my discussion with infectious disease physician [name] on 12/23 preferred regimen would be Descovy and Tivicay-she will contact the HIV pharmacist to attempt to obtain discount cards to make this a more affordable option.  Once this has been confirmed will transition back to Descovy and Tivicay -CD4 count on 12/23 up to 259 -ID recommends continuing prophylactic Bactrim an additional 3 months  Dysphagia 2/2 anoxic brain injury -Continue tube feeding per PEG -SLP attempted to introduce oral substances on 12/27 but patient did not accept and was pushing out with his tongue.  Given his acute UTI he may have been nauseated and this may have been reason for him not wishing to  eat. -Patient also with some redness and exudate on tongue.  Recently treated for oral thrush.  Began Magic mouthwash with lidocaine 1/21 with significant improvement in clinical exam  Constipation -No documented bowel movement since 1/13 -We will continue scheduled Colace, fiber supplements per tube -Add daily MiraLAX -Given one-time dose milk of magnesia on 1/20-no documented bowel movement on flowsheet -Suspect requirement for narcotics and multiple muscle relaxers contributing to decreased GI motility and constipation symptoms  Protein calorie malnutrition nutrition Status: Nutrition Problem: Increased nutrient needs Etiology: acute illness Signs/Symptoms: estimated needs Interventions: Tube feeding bolus doses of Osmolite, Juven twice daily, Prosource TF 45 mL 3 times daily Estimated body mass index is 25.98 kg/m as calculated from the following:   Height as of this encounter: 5' 6"  (1.676 m).   Weight as of this encounter: 73 kg.   Multiple decubiti not POA Wound / Incision (Open or Dehisced) 11/17/19 Puncture Abdomen Left;Anterior;Upper G-tube insertion site  (Active)  Date First Assessed/Time First Assessed: 11/17/19 1646   Wound Type: Puncture  Location: Abdomen  Location Orientation: Left;Anterior;Upper  Wound Description (Comments): G-tube insertion site   Present on Admission: No    Assessments 11/17/2019  4:47 PM 02/28/2020  8:41 PM  Dressing Type Gauze (Comment);Tape dressing Gauze (Comment)  Dressing Changed New -  Dressing Status Clean;Dry;Intact Old drainage  Dressing Change Frequency PRN -  Site / Wound Assessment Clean;Dry;Pink Pink;Yellow  Peri-wound Assessment Intact -  Closure None -  Drainage Amount None Scant  Drainage Description - Purulent  Treatment Cleansed -     No Linked orders to display      Other problems: Hypertension/grade 1 diastolic dysfunction:  -Continue amlodipine  -Echocardiogram this admission with preserved LVEF with evidence of  grade   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   -Continue Keppra; levetiracetam level 35.9 on 11/12 -no further seizure activity since transitioned out of ICU setting therefore will discontinue seizure precaution  SIRS/Fever 2/2 recurrent enterococcus UTI -Patient developed mild elevation in temperature overnight to 1/13 associated with slight increase in respiratory secretions and congested sounding cough/abnormal pulmonary exam as well as tachycardia./14 respiratory status much improved and no further fevers. -Respiratory viral panel neg for COVID, influenza and RSV.   -UA slightly abnormal/cx c/w enterococcus UTI- started on nitrofurantoin (prior UTI treated w/ augmentin) has completed 3 days of treatment -X-ray with bilateral opacities concerning for early pneumonia but CT of the chest without pneumonia, broad-spectrum antibiotics dc'd 1/14 -Because of dehydration increased frequency of free water from 200 cc every 4 hours to every 2 hours on 1/13- stopped on 1/15  Inguinal hernias -Evaluated by general surgery this admission. Documented as chronically incarcerated. Patient has been clinically stable and tolerating tube feedings and having bowel movements so unless patient obstructed would not be considered a candidate for repair. Even if obstructed consulting surgeon was not sure he would be a candidate for hernia repair regardless  Constipation -No documented bowel movement since 1/13 -We will continue scheduled Colace, fiber supplements per tube -Add daily MiraLAX -Given one-time dose milk of magnesia on 1/20-no documented bowel movement on flowsheet -Suspect requirement for narcotics and multiple muscle relaxers contributing to decreased GI motility and constipation symptoms  Goals of care:  -Palliative care was involved during this hospitalization.   -Goals of care were discussed. remains full code.  -Palliative care recommended outpatient follow-up with  palliative providers. -Reconsult Palliative medicine 1/27   Data Reviewed: Basic Metabolic Panel: No results for input(s): NA, K, CL, CO2, GLUCOSE, BUN, CREATININE, CALCIUM, MG, PHOS in the last 168 hours. Liver Function Tests: No results for input(s): AST, ALT, ALKPHOS, BILITOT, PROT, ALBUMIN in the last 168 hours. No results for input(s): LIPASE, AMYLASE in the last 168 hours. No results for input(s): AMMONIA in the last 168 hours. CBC: No results for input(s): WBC, NEUTROABS, HGB, HCT, MCV, PLT in the last 168 hours. Cardiac Enzymes: No results for input(s): CKTOTAL, CKMB, CKMBINDEX, TROPONINI in the last 168 hours. BNP (last 3 results) Recent Labs    02/15/20 0433  BNP 3.9    ProBNP (last 3 results) No results for input(s): PROBNP in the last 8760 hours.  CBG: Recent Labs  Lab 02/28/20 0450 02/28/20 0804 02/28/20 1228 02/28/20 1631 02/29/20 0339  GLUCAP 114* 96 113* 109* 99    Recent Results (from the past 240 hour(s))  Aerobic Culture (superficial specimen)     Status: None (Preliminary result)   Collection Time: 02/28/20  2:54 PM   Specimen: Wound  Result Value Ref Range Status   Specimen Description WOUND PEG SITE  Final   Special Requests NONE  Final   Gram Stain   Final    ABUNDANT WBC PRESENT,BOTH PMN AND MONONUCLEAR FEW SQUAMOUS EPITHELIAL CELLS PRESENT ABUNDANT GRAM POSITIVE RODS Performed at [facility], [place] [address], [place], [place] [address]    Culture PENDING  Incomplete   Report Status PENDING  Incomplete  Studies: No results found.  Scheduled Meds: . acetaminophen (TYLENOL) oral liquid 160 mg/5 mL  650 mg Per Tube Q6H  . amLODipine  10 mg Per Tube Daily  . baclofen  30 mg Per Tube TID  . chlorhexidine gluconate (MEDLINE KIT)  15 mL Mouth Rinse BID  . Darunavir-Cobicisctat-Emtricitabine-Tenofovir Alafenamide  1 tablet Oral Q breakfast  . famotidine  10.4 mg Per Tube BID  . feeding supplement (OSMOLITE 1.5 CAL)  237 mL Per  Tube Q24H  . feeding supplement (OSMOLITE 1.5 CAL)  474 mL Per Tube TID  . feeding supplement (PROSource TF)  45 mL Per Tube TID  . fiber  1 packet Per Tube BID  . folic acid  1 mg Per Tube Daily  . free water  200 mL Per Tube Q4H  . gabapentin  200 mg Per Tube Q8H  . heparin injection (subcutaneous)  5,000 Units Subcutaneous Q8H  . levETIRAcetam  1,500 mg Per Tube BID  . magic mouthwash w/lidocaine  5 mL Oral QID  . Muscle Rub   Topical QID  . oxyCODONE  5 mg Per Tube Q6H  . polyethylene glycol  17 g Per Tube QHS  . QUEtiapine  12.5 mg Per Tube QHS  . scopolamine  1 patch Transdermal Q72H  . sulfamethoxazole-trimethoprim  1 tablet Per Tube Once per day on Mon Wed Fri  . thiamine  100 mg Per Tube Daily  . tiZANidine  6 mg Per Tube Q6H   Continuous Infusions:   Active Problems:   Cardiac arrest [facility])   Community acquired pneumonia of left upper lobe of lung   Anoxic brain injury ([place])   Alcohol abuse   Acute respiratory failure with hypoxia (HCC)   Vomiting   Pressure injury of skin   Small bowel obstruction (HCC)   Palliative care encounter   Bowel obstruction (HCC)   Chronic respiratory failure (HCC)   Diastolic dysfunction   Tracheostomy dependent (HCC)   Dysphagia   Protein-calorie malnutrition (HCC)   Seizures (HCC)   Bilateral recurrent inguinal hernia   Muscle spasticity   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HCC)   Tracheobronchitis   Sequela of Corynebacterium infection   Abnormal urinalysis   Urinary tract infection due to Enterococcus   Fever   Consultants:  PCCM  Palliative medicine  General surgery  Neurology  Interventional radiology  Procedures:  EEG  Echocardiogram  Tracheostomy  Antibiotics: Anti-infectives (From admission, onward)   Start     Dose/Rate Route Frequency Ordered Stop   02/17/20 1100  nitrofurantoin (FURADANTIN) 25 MG/5ML suspension 100 mg        100 mg Per Tube Every 12 hours 02/17/20 1006 02/20/20 0924    02/15/20 1000  vancomycin (VANCOCIN) IVPB 1000 mg/200 mL premix  Status:  Discontinued        1,000 mg 200 mL/hr over 60 Minutes Intravenous Every 12 hours 02/14/20 2226 02/16/20 0742   02/14/20 2245  piperacillin-tazobactam (ZOSYN) IVPB 3.375 g  Status:  Discontinued        3.375 g 12.5 mL/hr over 240 Minutes Intravenous Every 8 hours 02/14/20 2226 02/16/20 0742   02/14/20 2245  vancomycin (VANCOREADY) IVPB 1500 mg/300 mL        1,500 mg 150 mL/hr over 120 Minutes Intravenous  Once 02/14/20 2226 02/15/20 0739   02/01/20 2200  amoxicillin-clavulanate (AUGMENTIN) 250-62.5 MG/5ML suspension 500 mg        500 mg Per Tube Every 8 hours 02/01/20 1343 02/06/20 1333  01/31/20 1015  levofloxacin (LEVAQUIN) 25 MG/ML solution 500 mg  Status:  Discontinued        500 mg Per Tube Daily 01/31/20 0919 02/01/20 1343   01/31/20 1000  cefTRIAXone (ROCEPHIN) 1 g in sodium chloride 0.9 % 100 mL IVPB  Status:  Discontinued        1 g 200 mL/hr over 30 Minutes Intravenous Every 24 hours 01/31/20 0905 01/31/20 0919   01/19/20 2200  linezolid (ZYVOX) tablet 600 mg        600 mg Per Tube Every 12 hours 01/19/20 1534 01/25/20 2019   01/19/20 1100  levofloxacin (LEVAQUIN) tablet 500 mg  Status:  Discontinued        500 mg Per Tube Daily 01/19/20 1004 01/19/20 1534   12/19/19 0930  Darunavir-Cobicisctat-Emtricitabine-Tenofovir Alafenamide (SYMTUZA) 800-150-200-10 MG TABS 1 tablet        1 tablet Oral Daily with breakfast 12/19/19 0840     12/13/19 1000  bictegravir-emtricitabine-tenofovir AF (BIKTARVY) 50-200-25 MG per tablet 1 tablet  Status:  Discontinued        1 tablet Oral Daily 12/12/19 1413 12/19/19 0840   11/17/19 1548  ceFAZolin (ANCEF) 2-4 GM/100ML-% IVPB       Note to Pharmacy: Domenick Bookbinder   : cabinet override      11/17/19 1548 11/18/19 0359   11/16/19 1515  ceFAZolin (ANCEF) IVPB 2g/100 mL premix        2 g 200 mL/hr over 30 Minutes Intravenous To Radiology 11/16/19 1511 11/17/19 1625   11/15/19  0900  sulfamethoxazole-trimethoprim (BACTRIM DS) 800-160 MG per tablet 1 tablet        1 tablet Per Tube Once per day on Mon Wed Fri 11/13/19 1906     11/13/19 1615  dolutegravir (TIVICAY) tablet 50 mg  Status:  Discontinued        50 mg Oral Daily 11/13/19 1521 12/12/19 1413   11/13/19 1615  emtricitabine-tenofovir AF (DESCOVY) 200-25 MG per tablet 1 tablet  Status:  Discontinued        1 tablet Per Tube Daily 11/13/19 1521 12/12/19 1413   11/13/19 1530  sulfamethoxazole-trimethoprim (BACTRIM DS) 800-160 MG per tablet 1 tablet  Status:  Discontinued        1 tablet Oral Once per day on Mon Wed Fri 11/13/19 1441 11/13/19 1906   11/13/19 1500  bictegravir-emtricitabine-tenofovir AF (BIKTARVY) 50-200-25 MG per tablet 1 tablet  Status:  Discontinued        1 tablet Oral Daily 11/13/19 1403 11/13/19 1521   11/10/19 1000  erythromycin 250 mg in sodium chloride 0.9 % 100 mL IVPB        250 mg 100 mL/hr over 60 Minutes Intravenous Every 8 hours 11/10/19 0907 11/11/19 2000   11/07/19 1200  ceFEPIme (MAXIPIME) 2 g in sodium chloride 0.9 % 100 mL IVPB        2 g 200 mL/hr over 30 Minutes Intravenous Every 8 hours 11/07/19 1013 11/13/19 2127   11/03/19 1200  cefTRIAXone (ROCEPHIN) 2 g in sodium chloride 0.9 % 100 mL IVPB        2 g 200 mL/hr over 30 Minutes Intravenous Every 24 hours 11/03/19 0922 11/05/19 1427   11/02/19 0800  vancomycin (VANCOREADY) IVPB 750 mg/150 mL  Status:  Discontinued        750 mg 150 mL/hr over 60 Minutes Intravenous Every 12 hours 11/01/19 1935 11/02/19 1105   11/02/19 0600  piperacillin-tazobactam (ZOSYN) IVPB 3.375 g  Status:  Discontinued  3.375 g 12.5 mL/hr over 240 Minutes Intravenous Every 8 hours 11/02/19 0311 11/03/19 0920   11/01/19 2200  ceFEPIme (MAXIPIME) 2 g in sodium chloride 0.9 % 100 mL IVPB  Status:  Discontinued        2 g 200 mL/hr over 30 Minutes Intravenous Every 12 hours 11/01/19 2143 11/02/19 0311   11/01/19 1915  vancomycin (VANCOREADY) IVPB  1500 mg/300 mL        1,500 mg 150 mL/hr over 120 Minutes Intravenous  Once 11/01/19 1909 11/01/19 2147   11/01/19 1845  cefTRIAXone (ROCEPHIN) 1 g in sodium chloride 0.9 % 100 mL IVPB        1 g 200 mL/hr over 30 Minutes Intravenous  Once 11/01/19 1842 11/01/19 2051   11/01/19 1845  azithromycin (ZITHROMAX) 500 mg in sodium chloride 0.9 % 250 mL IVPB        500 mg 250 mL/hr over 60 Minutes Intravenous  Once 11/01/19 1842 11/01/19 2051       Time spent: 20 minutes    Erin Hearing ANP  [facility]  7 am-330 pm/M-F for direct patient care and secure chat Please refer to Amion for contact information 02/29/2020, 8:06 AM  LOS: 120 days

## 2020-02-29 NOTE — Progress Notes (Deleted)
PROGRESS NOTE  Ryan Orozco QIW:[id number] DOB: 12/04/1965 DOA: 11/01/2019 PCP: Default, Provider, MD  HPI/Recap of past 14 hours: 55 year old male with HIV, chronic alcohol abuse who was presented to the emergency department after being found unresponsive outside a convinient store. EMS found him pulseless in PEA, unknown downtime, successfully resuscitated and brought to the emergency department. UDS positive for THC and benzodiazepines. CT head unremarkable. Failed extubation trials due to severe anoxic brain injury and had tracheostomy placed. PEG placed on 10/15.  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 This has led to significant pain as well. [name] was consulted with recommendations/input/orders   he may also benefit from Botox injections to both hips to aid in treatment of spasticity.  Subjective: February 28, 2020 Patient seen and examined at bedside he was quiet when regarding upon getting into the room he started making groaning sounds he is nonverbal, but try to make sounds as if to get an attention at the say something as a way of communicating: He has a tracheostomy he is contracted Nurse reported that his PEG tube area was having from previous discharge.   Assessment/Plan: Active Problems:   Cardiac arrest [facility])   Community acquired pneumonia of left upper lobe of lung   Anoxic brain injury ([name])   Alcohol abuse   Acute respiratory failure with hypoxia (H[facility])   Vomiting   Pressure injury of skin   Small bowel obstruction (HCC)   Palliative care encounter   Bowel obstruction (HCC)   Chronic respiratory failure (HCC)   Diastolic dysfunction   Tracheostomy dependent (HCC)   Dysphagia   Protein-calorie malnutrition (HCC)   Seizures (HCC)   Bilateral recurrent inguinal hernia   Muscle spasticity   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HCC)   Tracheobronchitis    Sequela of Corynebacterium infection   Abnormal urinalysis   Urinary tract infection due to Enterococcus   Fever  Anoxic brain injury 2/2 PEA arrest/pain syndrome secondary to hypertonicity/spastic tetraplegia:  -Presumed 2/2 combination alcohol and BZD overdose with UDS positive for THC and BZDs -Significant brain injury with underlying spastic tetraplegia long-term SNF placement recommended-continue PT/OT/SLP -Continue baclofen, gabapentin, scheduled oxycodone and Zanaflex  -Appreciate assistance of Dr. Swartz-12/27 recontactedviasecure chatDr. Naaman Plummer regarding consideration/evaluation for Botox to right hamstrings, rectus femoris -Continue bilateral upper extremity WHO resting splints along with PROM per nursing staff every 4 hours -Continue low-dose Seroquel for brain injury sequela -Continue scheduled Tylenol for pain along with Crea muscle rub -Continue KREG rotational bed  Enterococcus faecalisUTI -Mental status changes as documented above on 12/27 did have T-max of 99.9 on 12/28 despite receiving scheduled Tylenol for pain -Urinalysis highly abnormal with regimental leukocytes, many bacteria and greater than 50 WBC for UTI -Urine culturepositive for pansensitive Enterococcus-empiric Levaquin discontinued on 12/30 in favor of Augmentin and will receive a total of 5 days therapy  Acute hypoxemic respiratory failure/new tracheostomy tube requirement/Corynebacterium tracheobronchitis -Continue PMV training per SLP-per SLP did not do well with attempts to introduce oral substances12 /2;#4 cuffless trach in place -Trach team signed off as of 12/27 given not a candidate for decannulation until patient can reliably follow commands, cough and clear airway. -Completed 7 days of linezolid forCorynebacteriumtracheobronchitis -continue albuterol nebs -continue physiotherapy with Vibra vest along with supervised PMV trials -Follow-up chest x-ray on 12/24 with low lung volumes but no  acute cardiopulmonary findings  HIV: -CD4 count of 124 October 2021(had been as high as 250 Dec 2020)-as of  today up to 59 -Dc'd Tivicay and Descovy infavor of Biktarvy to for eventual dc to SNF;per my discussion with infectious disease physician [name] on 12/23 preferred regimen would be Descovy and Tivicay-she will contact the HIV pharmacist to attempt to obtain discount cards to make this a more affordable option. Once this has been confirmed will transition back to Descovy and Tivicay -CD4 count on 12/23 up to 259 -ID recommends continuing prophylactic Bactrim an additional 3 months  Dysphagia 2/2 anoxic brain injury -Continue tube feeding per PEG -Repeat SLP evaluation on 11/23 with recommendation for n.p.o. status; attempts to introduce oral substances on 12/27 but patient did not accept and was pushing out with his tongue. Given his acute UTI he may have been nauseated and this may have been reason for him not wishing to eat.  Goals of care:  -Palliative care was involved during this hospitalization.  -Goals of care were discussed. remains full code.  -Palliative care recommended outpatient follow-up with palliative providers. -May need to revisit end-of-life goals of care since patient making poor progress and currently having significant pain related to hypertonicity from brain injury  Protein calorie malnutrition nutrition Status: Nutrition Problem: Increased nutrient needs Etiology: acute illness Signs/Symptoms: estimated needs Interventions: Tube feedingbolus doses of Osmolite, Juven twice daily, Prosource TF 45 mL 3 times daily Estimated body mass index is 25.47 kg/m as calculated from the following: Height as of this encounter: _0  (1.676 m). Weight as of this encounter: 71.6 kg.  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  -Continue Keppra; levetiracetam level 35.9 on 11/12 -no further seizure activity since transitioned out of ICU setting therefore will discontinue seizure precaution  Inguinal hernias -Evaluated by general surgery this admission. Documented as chronically incarcerated. Patient has been clinically stable and tolerating tube feedings and having bowel movements so unless patient obstructed would not be considered a candidate for repair. Even if obstructed consulting surgeon was not sure he would be a candidate for hernia repair regardless .   Code Status: Full  Severity of Illness: The appropriate patient status for this patient is INPATIENT. Inpatient status is judged to be reasonable and necessary in order to provide the required intensity of service to ensure the patient's safety. The patient's presenting symptoms, physical exam findings, and initial radiographic and laboratory data in the context of their chronic comorbidities is felt to place them at high risk for further clinical deterioration. Furthermore, it is not anticipated that the patient will be medically stable for discharge from the hospital within 2 midnights of admission. The following factors support the patient status of inpatient.   Remains inpatient appropriate because:Altered mental status, Unsafe d/c plan and Inpatient level of care appropriate due to severity of illness. Patient newly diagnosed with anoxic brain injury  * I certify that at the point of admission it is my clinical judgment that the patient will  require inpatient hospital care spanning beyond 2 midnights from the point of admission due to high intensity of service, high risk for further deterioration and high frequency of surveillance required.*  Family Communication: None at bedside  Disposition Plan: SNF.  Medically stable to be discharged but no bed offers available yet caseworker and social worker continue to work with DSS   Consultants:  Physical medicine  Procedures:  Tracheostomy collar November 18, 2019   PEG tube placement November 17, 2019  Antimicrobials:  None  DVT prophylaxis: Subacute heparin   Objective: Vitals:   02/28/20 2354 02/29/20 0216 02/29/20 0423 02/29/20 0754  BP:   121/73   Pulse: 86 80 71 81  Resp: _0 Temp:   98.7 F (37.1 C)   TempSrc:   Oral   SpO2: 96% 96% (!) 9% 94%  Weight:      Height:       No intake or output data in the 24 hours ending 02/29/20 1006 Filed Weights   02/25/20 0358 02/26/20 0500 02/27/20 0325  Weight: 74.5 kg 71.5 kg 73 kg   Body mass index is 25.98 kg/m.  Exam:  . General: 55 y.o. year-old male well developed well nourished in no acute distress.  Alert, making grunting sound groaning sounds in response . Cardiovascular: Regular rate and rhythm with no rubs or gallops.  No thyromegaly or JVD noted.   Marland Kitchen Respiratory: Clear to auscultation with no wheezes or rales. Good inspiratory effort.  Tracheal collar in place . Abdomen: Soft nontender nondistended with normal bowel sounds x4 quadrants.  PEG tube in place . Musculoskeletal: Contracted all 4 extremities . Skin: Mild  drainage to the PEG site area, . Psychiatry: Mood is appropriate for condition and setting    Data Reviewed: CBC: No results for input(s): WBC, NEUTROABS, HGB, HCT, MCV, PLT in the last 168 hours. Basic Metabolic Panel: No results for input(s): NA, K, CL, CO2, GLUCOSE, BUN, CREATININE, CALCIUM, MG, PHOS in the last 168 hours. GFR: Estimated Creatinine Clearance: 94.1  mL/min (by C-G formula based on SCr of 0.69 mg/dL). Liver Function Tests: No results for input(s): AST, ALT, ALKPHOS, BILITOT, PROT, ALBUMIN in the last 168 hours. No results for input(s): LIPASE, AMYLASE in the last 168 hours. No results for input(s): AMMONIA in the last 168 hours. Coagulation Profile: No results for input(s): INR, PROTIME in the last 168 hours. Cardiac Enzymes: No results for input(s): CKTOTAL, CKMB, CKMBINDEX, TROPONINI in the last 168 hours. BNP (last 3 results) No results for input(s): PROBNP in the last 8760 hours. HbA1C: No results for input(s): HGBA1C in the last 72 hours. CBG: Recent Labs  Lab 02/28/20 0804 02/28/20 1228 02/28/20 1631 02/29/20 0339 02/29/20 0807  GLUCAP 96 113* 109* 99 103*   Lipid Profile: No results for input(s): CHOL, HDL, LDLCALC, TRIG, CHOLHDL, LDLDIRECT in the last 72 hours. Thyroid Function Tests: No results for input(s): TSH, T4TOTAL, FREET4, T3FREE, THYROIDAB in the last 72 hours. Anemia Panel: No results for input(s): VITAMINB12, FOLATE, FERRITIN, TIBC, IRON, RETICCTPCT in the last 72 hours. Urine analysis:    Component Value Date/Time   COLORURINE YELLOW 02/15/2020 0124   APPEARANCEUR CLEAR 02/15/2020 0124   LABSPEC 1.017 02/15/2020 0124   PHURINE 5.0 02/15/2020 0124   GLUCOSEU NEGATIVE 02/15/2020 0124   HGBUR NEGATIVE 02/15/2020 0124   BILIRUBINUR NEGATIVE 02/15/2020 0124   KETONESUR NEGATIVE 02/15/2020 0124   PROTEINUR NEGATIVE 02/15/2020 0124   NITRITE NEGATIVE 02/15/2020 0124   LEUKOCYTESUR SMALL (A) 02/15/2020 0124   Sepsis Labs: _1 (procalcitonin:4,lacticidven:4)  ) Recent Results (from the past 240 hour(s))  Aerobic Culture (superficial specimen)     Status: None (Preliminary result)  Collection Time: 02/28/20  2:54 PM   Specimen: Wound  Result Value Ref Range Status   Specimen Description WOUND PEG SITE  Final   Special Requests NONE  Final   Gram Stain   Final    ABUNDANT WBC PRESENT,BOTH  PMN AND MONONUCLEAR FEW SQUAMOUS EPITHELIAL CELLS PRESENT ABUNDANT GRAM POSITIVE RODS Performed at [facility], [place] [address] [place], [place] [address]    Culture PENDING  Incomplete   Report Status PENDING  Incomplete      Studies: No results found.  Scheduled Meds: . acetaminophen (TYLENOL) oral liquid 160 mg/5 mL  650 mg Per Tube Q6H  . amLODipine  10 mg Per Tube Daily  . baclofen  30 mg Per Tube TID  . chlorhexidine gluconate (MEDLINE KIT)  15 mL Mouth Rinse BID  . Darunavir-Cobicisctat-Emtricitabine-Tenofovir Alafenamide  1 tablet Oral Q breakfast  . famotidine  10.4 mg Per Tube BID  . feeding supplement (OSMOLITE 1.5 CAL)  237 mL Per Tube Q24H  . feeding supplement (OSMOLITE 1.5 CAL)  474 mL Per Tube TID  . feeding supplement (PROSource TF)  45 mL Per Tube TID  . fiber  1 packet Per Tube BID  . folic acid  1 mg Per Tube Daily  . free water  200 mL Per Tube Q4H  . gabapentin  200 mg Per Tube Q8H  . heparin injection (subcutaneous)  5,000 Units Subcutaneous Q8H  . levETIRAcetam  1,500 mg Per Tube BID  . magic mouthwash w/lidocaine  5 mL Oral QID  . Muscle Rub   Topical QID  . oxyCODONE  5 mg Per Tube Q6H  . polyethylene glycol  17 g Per Tube QHS  . QUEtiapine  12.5 mg Per Tube QHS  . scopolamine  1 patch Transdermal Q72H  . sulfamethoxazole-trimethoprim  1 tablet Per Tube Once per day on Mon Wed Fri  . thiamine  100 mg Per Tube Daily  . tiZANidine  6 mg Per Tube Q6H    Continuous Infusions:   LOS: 120 days     Cristal Deer, MD [facility]  To reach me or the doctor on call, go to: [email or web address] Password [email or web address]  02/29/2020, 10:06 AM

## 2020-02-29 NOTE — Plan of Care (Signed)
  Problem: Clinical Measurements: Goal: Respiratory complications will improve Outcome: Progressing   Problem: Clinical Measurements: Goal: Cardiovascular complication will be avoided Outcome: Progressing   Problem: Nutrition: Goal: Adequate nutrition will be maintained Outcome: Progressing   Problem: Elimination: Goal: Will not experience complications related to bowel motility Outcome: Progressing   Problem: Elimination: Goal: Will not experience complications related to urinary retention Outcome: Progressing

## 2020-03-01 DIAGNOSIS — Z66 Do not resuscitate: Secondary | ICD-10-CM

## 2020-03-01 DIAGNOSIS — Z7189 Other specified counseling: Secondary | ICD-10-CM

## 2020-03-01 LAB — AEROBIC CULTURE W GRAM STAIN (SUPERFICIAL SPECIMEN)

## 2020-03-01 LAB — GLUCOSE, CAPILLARY
Glucose-Capillary: 127 mg/dL — ABNORMAL HIGH (ref 70–99)
Glucose-Capillary: 105 mg/dL — ABNORMAL HIGH (ref 70–99)
Glucose-Capillary: 68 mg/dL — ABNORMAL LOW (ref 70–99)
Glucose-Capillary: 98 mg/dL (ref 70–99)
Glucose-Capillary: 81 mg/dL (ref 70–99)
Glucose-Capillary: 148 mg/dL — ABNORMAL HIGH (ref 70–99)
Glucose-Capillary: 87 mg/dL (ref 70–99)
Glucose-Capillary: 80 mg/dL (ref 70–99)
Glucose-Capillary: 124 mg/dL — ABNORMAL HIGH (ref 70–99)

## 2020-03-01 NOTE — Progress Notes (Signed)
[facility] PROGRESS NOTE  Ryan Orozco VVZ:[id number] DOB: July 31, 1965 DOA: 11/01/2019 PCP: Default, Provider, MD    11/16   Status: Remains inpatient appropriate because:Altered mental status, Unsafe d/c plan and Inpatient level of care appropriate due to severity of illness. Patient newly diagnosed with anoxic brain injury  Dispo: The patient is from: Home              Anticipated d/c is to: SNF              Anticipated d/c date is: > 3 days              Patient currently is medically stable to d/c.  Barriers to discharge: No SNF bed offers.  Needs trach capable facility. TOC communicating with financial counseling team who have yet to hear back from DSS regarding patient's assigned caseworker.  TOC spoke with [facility] rehab.  Patient would be an excellent candidate but they request he have active Medicaid and disability before they can accept him.  Also faxed out information to [facility] as of 1/10   Code Status: DNR after d/w palliative team on 1/27 Family Communication: 1/24 dtr [name] updated DVT prophylaxis: Subcutaneous heparin Vaccination status: Will order first dose Pfizer COVID-vaccine on 1/10; next week we will order pneumococcal and influenza vaccinations  Foley catheter:  No  HPI: 55 year old male with HIV, chronic alcohol abuse who was presented to the emergency department after being found unresponsive outside a convinient store.  EMS found him pulseless in PEA, unknown downtime, successfully resuscitated and brought to the emergency department.  UDS positive for THC and benzodiazepines.  CT head unremarkable.  Failed extubation trials due to severe anoxic brain injury and had tracheostomy placed.  PEG placed on 10/15.  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  This has led to significant pain as well.  [name] was consulted and recommendations/input/orders highly  appreciated.  She may also benefit from Botox injections to both hips to aid in treatment of spasticity.   Significant Hospital Events   9/29 Admit post PEA arrest. UDS positive for THC, benzo's. ETOH 177 10/05 EEG ongoing. Versed restarted overnight due to agitation. Nicardipine increased.  10/06 Developed tachypnea with WUA, no follow commands off sedation  10/07 Vomiting, TF held / restarted with recurrent vomiting 10/16 tracheostomy collar for 9 hours 10/18->10/19 off vent all night  10/20> TC 11/15 > downsized to #4 Shiley flex CFS  Subjective: Patient awake.  Makes appropriate eye contact.  Smiles during exam.  Objective: Vitals:   03/01/20 0535 03/01/20 0740  BP: 129/88 106/89  Pulse:    Resp: 16 18  Temp: 98.6 F (37 C) 98.7 F (37.1 C)  SpO2: 96% 98%    Intake/Output Summary (Last 24 hours) at 03/01/2020 0809 Last data filed at 02/29/2020 1957 Gross per 24 hour  Intake -  Output 1850 ml  Net -1850 ml   Filed Weights   02/25/20 0358 02/26/20 0500 02/27/20 0325  Weight: 74.5 kg 71.5 kg 73 kg    Exam: General: Awake, calm, no acute distress Pulmonary: Lung sounds clear to auscultation, tracheostomy tube without any drainage.  Trach collar FiO2 21%.   Cardiac: Heart sounds S1-S2, pulses regular nontachycardic, extremities warm to touch Abdomen: Soft nontender with normal active bowel sounds.  PEG tube. LBM 1/26 Neurological: Severe hypertonicity/spasticity bilateral lower extremities involving the hip flexors and hamstrings; primarily keeps bilateral hips flexed and any attempts to extend  either leg especially the right leg results in significant pain response.  Limited use of upper extremities with action primarily initiated from the shoulder girth. Psychiatric: Awake, nonverbal except for simple monosyllabic responses such as hey so unable to assess orientation  Assessment/Plan: Acute problems: Anoxic brain injury 2/2 PEA arrest/pain syndrome secondary to  hypertonicity/spastic tetraplegia:  -Presumed 2/2 combination alcohol and BZD overdose with UDS positive for THC and BZDs -Significant brain injury with underlying spastic tetraplegia long-term SNF placement recommended-continue PT/OT/SLP -Continue baclofen, gabapentin, scheduled oxycodone and Zanaflex -as of 1/21 patient seemed to be having increasing pain so have increased frequency of scheduled oxycodone to every 6 hours -Appreciate assistance of [name]; 1/6 Botox injection into the quadriceps and rectus femoris as well as the biceps femoris and hamstrings, semimembranosus and semitendinosus.  Unable to inject iliopsoas without EMG guidance.  Unfortunately no significant improvement and spasticity hypertonicity. -Continue bilateral upper extremity WHO resting splints along with PROM per nursing staff every 4 hours -Continue low-dose Seroquel for brain injury sequela -Continue KREG rotational bed   Goals of care:  -Palliative care was involved during this hospitalization.    Initially goals of care were discussed and patient remains a full code.  Palliative care team reconsulted on 1/27 and after an extensive discussion daughter does realize that patient quite debilitated and would not survive a resuscitation therefore patient made a DNR  Chronic respiratory failure with inability to maintain patent airway requiring chronic tracheostomy tube/Recent Corynebacterium tracheobronchitis (resolved) -Continue PMV training per SLP-#4 cuffless trach in place-trach changed out on 1/10 RT -Patient spiked fever on 1/26.  Sputum culture obtained with rare Pseudomonas and abundant Corynebacterium.  Patient previously treated for corynebacterium several months ago and at that time was highly symptomatic with excessive secretions and abnormal chest x-ray and CT of the chest.  Patient without new fevers or increased O2 demand and does not have significant secretions.  We will consider this as colonized at this  juncture but continue to monitor.  If develops respiratory symptoms will need to obtain chest x-ray and repeat cultures. -Given altered mentation and inability to manage secretions PCCM states patient is not a candidate for elective decannulation -continue physiotherapy with Vibra vest along with supervised PMV trials  HIV:  -CD4 count of 124 October 2021 (had been as high as 250 Dec 2020)-as of today up to 259 -Emesis recommends repeating viral load which has been ordered on 1/14 -Dc'd Tivicay and Descovy infavor of Biktarvy to for eventual dc to SNF; per my discussion with infectious disease physician [name] on 12/23 preferred regimen would be Descovy and Tivicay-she will contact the HIV pharmacist to attempt to obtain discount cards to make this a more affordable option.  Once this has been confirmed will transition back to Descovy and Tivicay -CD4 count on 12/23 up to 259 -ID recommends continuing prophylactic Bactrim an additional 3 months  Dysphagia 2/2 anoxic brain injury -Continue tube feeding per PEG -SLP attempted to introduce oral substances on 12/27 but patient did not accept and was pushing out with his tongue.  Given his acute UTI he may have been nauseated and this may have been reason for him not wishing to eat. -Patient also with some redness and exudate on tongue.  Recently treated for oral thrush.  Began Magic mouthwash with lidocaine 1/21 with significant improvement in clinical exam  Constipation -No documented bowel movement since 1/13 -We will continue scheduled Colace, fiber supplements per tube -Add daily MiraLAX -Given one-time dose milk  of magnesia on 1/20-no documented bowel movement on flowsheet -Suspect requirement for narcotics and multiple muscle relaxers contributing to decreased GI motility and constipation symptoms  Protein calorie malnutrition nutrition Status: Nutrition Problem: Increased nutrient needs Etiology: acute illness Signs/Symptoms:  estimated needs Interventions: Tube feeding bolus doses of Osmolite, Juven twice daily, Prosource TF 45 mL 3 times daily Estimated body mass index is 25.98 kg/m as calculated from the following:   Height as of this encounter: 5' 6"  (1.676 m).   Weight as of this encounter: 73 kg.   Multiple decubiti not POA Wound / Incision (Open or Dehisced) 11/17/19 Puncture Abdomen Left;Anterior;Upper G-tube insertion site  (Active)  Date First Assessed/Time First Assessed: 11/17/19 1646   Wound Type: Puncture  Location: Abdomen  Location Orientation: Left;Anterior;Upper  Wound Description (Comments): G-tube insertion site   Present on Admission: No    Assessments 11/17/2019  4:47 PM 02/29/2020  8:23 PM  Dressing Type Gauze (Comment);Tape dressing Gauze (Comment)  Dressing Changed New New  Dressing Status Clean;Dry;Intact Clean;Dry;Intact  Dressing Change Frequency PRN Daily  Site / Wound Assessment Clean;Dry;Pink Clean;Dry  Peri-wound Assessment Intact -  Closure None -  Drainage Amount None Scant  Drainage Description - Serosanguineous  Non-staged Wound Description - Not applicable  Treatment Cleansed -     No Linked orders to display      Other problems: Hypertension/grade 1 diastolic dysfunction:  -Continue amlodipine  -Echocardiogram this admission with preserved LVEF with evidence of grade   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   -Continue Keppra; levetiracetam level 35.9 on 11/12 -no further seizure activity since transitioned out of ICU setting therefore will discontinue seizure precaution  SIRS/Fever 2/2 recurrent enterococcus UTI -Patient developed mild elevation in temperature overnight to 1/13 associated with slight increase in respiratory secretions and congested sounding cough/abnormal pulmonary exam as well as tachycardia./14 respiratory status much improved and no further fevers. -Respiratory viral panel neg for COVID, influenza and RSV.   -UA slightly  abnormal/cx c/w enterococcus UTI- started on nitrofurantoin (prior UTI treated w/ augmentin) has completed 3 days of treatment -X-ray with bilateral opacities concerning for early pneumonia but CT of the chest without pneumonia, broad-spectrum antibiotics dc'd 1/14 -Because of dehydration increased frequency of free water from 200 cc every 4 hours to every 2 hours on 1/13- stopped on 1/15  Inguinal hernias -Evaluated by general surgery this admission. Documented as chronically incarcerated. Patient has been clinically stable and tolerating tube feedings and having bowel movements so unless patient obstructed would not be considered a candidate for repair. Even if obstructed consulting surgeon was not sure he would be a candidate for hernia repair regardless  Constipation -No documented bowel movement since 1/13 -We will continue scheduled Colace, fiber supplements per tube -Add daily MiraLAX -Given one-time dose milk of magnesia on 1/20-no documented bowel movement on flowsheet -Suspect requirement for narcotics and multiple muscle relaxers contributing to decreased GI motility and constipation symptoms  Goals of care:  -Palliative care was involved during this hospitalization.   -Goals of care were discussed. remains full code.  -Palliative care recommended outpatient follow-up with palliative providers. -Reconsult Palliative medicine 1/27   Data Reviewed: Basic Metabolic Panel: No results for input(s): NA, K, CL, CO2, GLUCOSE, BUN, CREATININE, CALCIUM, MG, PHOS in the last 168 hours. Liver Function Tests: No results for input(s): AST, ALT, ALKPHOS, BILITOT, PROT, ALBUMIN in the last 168 hours. No results for input(s): LIPASE, AMYLASE in the last 168 hours. No results for input(s): AMMONIA  in the last 168 hours. CBC: No results for input(s): WBC, NEUTROABS, HGB, HCT, MCV, PLT in the last 168 hours. Cardiac Enzymes: No results for input(s): CKTOTAL, CKMB, CKMBINDEX, TROPONINI in the  last 168 hours. BNP (last 3 results) Recent Labs    02/15/20 0433  BNP 3.9    ProBNP (last 3 results) No results for input(s): PROBNP in the last 8760 hours.  CBG: Recent Labs  Lab 02/29/20 0807 02/29/20 1142 02/29/20 1625 02/29/20 2338 03/01/20 0731  GLUCAP 103* 135* 89 97 105*    Recent Results (from the past 240 hour(s))  Aerobic Culture (superficial specimen)     Status: None (Preliminary result)   Collection Time: 02/28/20  2:54 PM   Specimen: Wound  Result Value Ref Range Status   Specimen Description WOUND PEG SITE  Final   Special Requests NONE  Final   Gram Stain   Final    ABUNDANT WBC PRESENT,BOTH PMN AND MONONUCLEAR FEW SQUAMOUS EPITHELIAL CELLS PRESENT ABUNDANT GRAM POSITIVE RODS    Culture   Final    RARE PSEUDOMONAS AERUGINOSA SUSCEPTIBILITIES TO FOLLOW ABUNDANT DIPHTHEROIDS(CORYNEBACTERIUM SPECIES) Standardized susceptibility testing for this organism is not available. Performed at [facility], [place] [address] [place], [place] [address]    Report Status PENDING  Incomplete     Studies: No results found.  Scheduled Meds: . acetaminophen (TYLENOL) oral liquid 160 mg/5 mL  650 mg Per Tube Q6H  . amLODipine  10 mg Per Tube Daily  . baclofen  30 mg Per Tube TID  . chlorhexidine gluconate (MEDLINE KIT)  15 mL Mouth Rinse BID  . Darunavir-Cobicisctat-Emtricitabine-Tenofovir Alafenamide  1 tablet Oral Q breakfast  . famotidine  10.4 mg Per Tube BID  . feeding supplement (OSMOLITE 1.5 CAL)  237 mL Per Tube Q24H  . feeding supplement (OSMOLITE 1.5 CAL)  474 mL Per Tube TID  . feeding supplement (PROSource TF)  45 mL Per Tube TID  . fiber  1 packet Per Tube BID  . folic acid  1 mg Per Tube Daily  . free water  200 mL Per Tube Q4H  . gabapentin  200 mg Per Tube Q8H  . heparin injection (subcutaneous)  5,000 Units Subcutaneous Q8H  . levETIRAcetam  1,500 mg Per Tube BID  . magic mouthwash w/lidocaine  5 mL Oral QID  . Muscle Rub   Topical QID   . oxyCODONE  5 mg Per Tube Q6H  . polyethylene glycol  17 g Per Tube QHS  . QUEtiapine  12.5 mg Per Tube QHS  . scopolamine  1 patch Transdermal Q72H  . sulfamethoxazole-trimethoprim  1 tablet Per Tube Once per day on Mon Wed Fri  . thiamine  100 mg Per Tube Daily  . tiZANidine  6 mg Per Tube Q6H   Continuous Infusions:   Active Problems:   Cardiac arrest [facility])   Community acquired pneumonia of left upper lobe of lung   Anoxic brain injury ([place])   Alcohol abuse   Acute respiratory failure with hypoxia (HCC)   Vomiting   Pressure injury of skin   Small bowel obstruction (HCC)   Palliative care encounter   Bowel obstruction (HCC)   Chronic respiratory failure (HCC)   Diastolic dysfunction   Tracheostomy dependent (HCC)   Dysphagia   Protein-calorie malnutrition (HCC)   Seizures (HCC)   Bilateral recurrent inguinal hernia   Muscle spasticity   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HCC)   Tracheobronchitis   Sequela of Corynebacterium infection  Abnormal urinalysis   Urinary tract infection due to Enterococcus   Fever   Consultants:  PCCM  Palliative medicine  General surgery  Neurology  Interventional radiology  Procedures:  EEG  Echocardiogram  Tracheostomy  Antibiotics: Anti-infectives (From admission, onward)   Start     Dose/Rate Route Frequency Ordered Stop   02/17/20 1100  nitrofurantoin (FURADANTIN) 25 MG/5ML suspension 100 mg        100 mg Per Tube Every 12 hours 02/17/20 1006 02/20/20 0924   02/15/20 1000  vancomycin (VANCOCIN) IVPB 1000 mg/200 mL premix  Status:  Discontinued        1,000 mg 200 mL/hr over 60 Minutes Intravenous Every 12 hours 02/14/20 2226 02/16/20 0742   02/14/20 2245  piperacillin-tazobactam (ZOSYN) IVPB 3.375 g  Status:  Discontinued        3.375 g 12.5 mL/hr over 240 Minutes Intravenous Every 8 hours 02/14/20 2226 02/16/20 0742   02/14/20 2245  vancomycin (VANCOREADY) IVPB 1500 mg/300 mL        1,500 mg 150  mL/hr over 120 Minutes Intravenous  Once 02/14/20 2226 02/15/20 0739   02/01/20 2200  amoxicillin-clavulanate (AUGMENTIN) 250-62.5 MG/5ML suspension 500 mg        500 mg Per Tube Every 8 hours 02/01/20 1343 02/06/20 1333   01/31/20 1015  levofloxacin (LEVAQUIN) 25 MG/ML solution 500 mg  Status:  Discontinued        500 mg Per Tube Daily 01/31/20 0919 02/01/20 1343   01/31/20 1000  cefTRIAXone (ROCEPHIN) 1 g in sodium chloride 0.9 % 100 mL IVPB  Status:  Discontinued        1 g 200 mL/hr over 30 Minutes Intravenous Every 24 hours 01/31/20 0905 01/31/20 0919   01/19/20 2200  linezolid (ZYVOX) tablet 600 mg        600 mg Per Tube Every 12 hours 01/19/20 1534 01/25/20 2019   01/19/20 1100  levofloxacin (LEVAQUIN) tablet 500 mg  Status:  Discontinued        500 mg Per Tube Daily 01/19/20 1004 01/19/20 1534   12/19/19 0930  Darunavir-Cobicisctat-Emtricitabine-Tenofovir Alafenamide (SYMTUZA) 800-150-200-10 MG TABS 1 tablet        1 tablet Oral Daily with breakfast 12/19/19 0840     12/13/19 1000  bictegravir-emtricitabine-tenofovir AF (BIKTARVY) 50-200-25 MG per tablet 1 tablet  Status:  Discontinued        1 tablet Oral Daily 12/12/19 1413 12/19/19 0840   11/17/19 1548  ceFAZolin (ANCEF) 2-4 GM/100ML-% IVPB       Note to Pharmacy: Domenick Bookbinder   : cabinet override      11/17/19 1548 11/18/19 0359   11/16/19 1515  ceFAZolin (ANCEF) IVPB 2g/100 mL premix        2 g 200 mL/hr over 30 Minutes Intravenous To Radiology 11/16/19 1511 11/17/19 1625   11/15/19 0900  sulfamethoxazole-trimethoprim (BACTRIM DS) 800-160 MG per tablet 1 tablet        1 tablet Per Tube Once per day on Mon Wed Fri 11/13/19 1906     11/13/19 1615  dolutegravir (TIVICAY) tablet 50 mg  Status:  Discontinued        50 mg Oral Daily 11/13/19 1521 12/12/19 1413   11/13/19 1615  emtricitabine-tenofovir AF (DESCOVY) 200-25 MG per tablet 1 tablet  Status:  Discontinued        1 tablet Per Tube Daily 11/13/19 1521 12/12/19 1413    11/13/19 1530  sulfamethoxazole-trimethoprim (BACTRIM DS) 800-160 MG per tablet 1 tablet  Status:  Discontinued        1 tablet Oral Once per day on Mon Wed Fri 11/13/19 1441 11/13/19 1906   11/13/19 1500  bictegravir-emtricitabine-tenofovir AF (BIKTARVY) 50-200-25 MG per tablet 1 tablet  Status:  Discontinued        1 tablet Oral Daily 11/13/19 1403 11/13/19 1521   11/10/19 1000  erythromycin 250 mg in sodium chloride 0.9 % 100 mL IVPB        250 mg 100 mL/hr over 60 Minutes Intravenous Every 8 hours 11/10/19 0907 11/11/19 2000   11/07/19 1200  ceFEPIme (MAXIPIME) 2 g in sodium chloride 0.9 % 100 mL IVPB        2 g 200 mL/hr over 30 Minutes Intravenous Every 8 hours 11/07/19 1013 11/13/19 2127   11/03/19 1200  cefTRIAXone (ROCEPHIN) 2 g in sodium chloride 0.9 % 100 mL IVPB        2 g 200 mL/hr over 30 Minutes Intravenous Every 24 hours 11/03/19 0922 11/05/19 1427   11/02/19 0800  vancomycin (VANCOREADY) IVPB 750 mg/150 mL  Status:  Discontinued        750 mg 150 mL/hr over 60 Minutes Intravenous Every 12 hours 11/01/19 1935 11/02/19 1105   11/02/19 0600  piperacillin-tazobactam (ZOSYN) IVPB 3.375 g  Status:  Discontinued        3.375 g 12.5 mL/hr over 240 Minutes Intravenous Every 8 hours 11/02/19 0311 11/03/19 0920   11/01/19 2200  ceFEPIme (MAXIPIME) 2 g in sodium chloride 0.9 % 100 mL IVPB  Status:  Discontinued        2 g 200 mL/hr over 30 Minutes Intravenous Every 12 hours 11/01/19 2143 11/02/19 0311   11/01/19 1915  vancomycin (VANCOREADY) IVPB 1500 mg/300 mL        1,500 mg 150 mL/hr over 120 Minutes Intravenous  Once 11/01/19 1909 11/01/19 2147   11/01/19 1845  cefTRIAXone (ROCEPHIN) 1 g in sodium chloride 0.9 % 100 mL IVPB        1 g 200 mL/hr over 30 Minutes Intravenous  Once 11/01/19 1842 11/01/19 2051   11/01/19 1845  azithromycin (ZITHROMAX) 500 mg in sodium chloride 0.9 % 250 mL IVPB        500 mg 250 mL/hr over 60 Minutes Intravenous  Once 11/01/19 1842 11/01/19 2051        Time spent: 20 minutes    Erin Hearing ANP  [facility]  7 am-330 pm/M-F for direct patient care and secure chat Please refer to [place] for contact information 03/01/2020, 8:09 AM  LOS: 121 days

## 2020-03-01 NOTE — Consult Note (Addendum)
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 EMS found him pulseless in PEA, unknown downtime, successfully resuscitated and brought to the emergency department. UDS positive for THC and benzodiazepines. CT head unremarkable. Failed extubation trials due to severe anoxic brain injury and had tracheostomy placed. PEG placed on 10/15.  [place]roz[place] was seen by my colleague on the PMT in October. He had tracheostomy and Peg tube placement during that month and the goals at that time were to remain aggressive.   Palliative care has been re-consulted for goals of care and a more comprehensive discussion of code status.   Clinical Assessment/Goals of Care:  *Please note that this is a verbal dictation therefore any spelling or grammatical errors are due to the "Dragon Medical One" system interpretation.  I have reviewed medical records including EPIC notes, labs and imaging, received report from bedside RN, assessed the patient who is in bed, lower extremities appear contracted. He is unable to answer questions - noted to be on a trach collar.    I called Ryan Orozco (daughter) to further discuss diagnosis prognosis, GOC, EOL wishes, disposition and options.   I introduced Palliative Medicine as specialized medical care for people living with serious illness. It focuses on providing relief from the symptoms and stress of a serious illness. The goal is to improve quality of life for both the patient and the family.  Prior to hospitalization Ryan Orozco had been living  in an apartment with his brother. He had suffered from alcohol misuse disorder. Ryan Orozco just shares that she is from [place] and that she and her father had been estranged for a period of years though had reconnected  -she had been a source of support for Ryan [place] encouraging him to go to doctors appointments and even paying for his doctor's appointments. Ryan Orozco has it seems at times made some poor choices and been incarcerated on various occasions.  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 He has never been married and has 2 children, Ryan Orozco (daughter) and a son who is imprisoned.  Ryan Orozco is not considered to be an overtly religious man.  Per conversation with [place], [place] Orozco has declined over the past 3 years after his mother passing away.  He was noted to start drinking more heavily and lacking in proper self-care.  He was fully independent before hospitalization though his overall decision making in terms of healthcare was quitepoor.  A detailed discussion was had today regarding advanced directives.  Concepts specific to code status, artifical feeding and hydration, continued IV antibiotics and rehospitalization was had.  Ryan Orozco would like to stay the present course though she has shared that [place] has experienced enough and if he every went into cardiac or respiratory distress again that she would not wish for him to be resuscitated. She shares that this would be selfish on her part. She has decided to make [place] DNAR/DNI code status.   The difference between a aggressive medical intervention path  and a palliative comfort care path for this patient at  this time was had. Ryan Orozco expresses that after the initial PEA arrest she was hopeful that [name] would make more of an improvement.  She shares with me that he did improve but certainly not to the level of being independent again.  She is very much in the real about what to expect  moving forward and that Ryan Orozco will be institutionalized for the rest of his life.  She shares that she wants to continue doing all that we are doing in the hopes that he will be able to eventually transfer closer to her.  Discussed the importance of continued conversation with family and their  medical providers regarding overall plan of care and treatment options, ensuring decisions are within the context of the patients values and GOCs.  Decision Maker: Ryan Orozco (daughter) [phone]  SUMMARY OF RECOMMENDATIONS   DNAR/DNI  Will place Gold DNR on chart and in [facility]yn[facility]  TOC - Actively involved to aid in disability and transfer planning  Ongoing PMT support as needed otherwise for the time being we will sign off  Code Status/Advance Care Planning: DNAR/DNI   Palliative Prophylaxis:   Oral Care, Turn Q2H  Additional Recommendations (Limitations, Scope, Preferences):  Continue current scope of care  Psycho-social/Spiritual:   Desire for further Chaplaincy support: No  Additional Recommendations: Continue current scope of care   Prognosis: Post PEA arrest with very severe frailty. Patients overall prognosis is quite poor given his co-morbidities and he is at high risk in terms of his 12 month mortality.   Discharge Planning: Discharge plan uncertain. Awaiting disability and ideally transfer to facility closer to patients daughter in the [place] area.  Vitals:   03/01/20 0338 03/01/20 0535  BP:  129/88  Pulse: (!) 103   Resp: (!) 22 16  Temp:  98.6 F (37 C)  SpO2: 94% 96%    Intake/Output Summary (Last 24 hours) at 03/01/2020 3220 Last data filed at 02/29/2020 1957 Gross per 24 hour  Intake -  Output 1850 ml  Net -1850 ml   Last Weight  Most recent update: 02/27/2020  4:09 AM   Weight  73 kg (160 lb 15 oz)           Gen:  AA M in NAD HEENT: Dry mucous membranes CV: Irregular rate and rhythm  PULM: On trach collar ABD: soft/nontender, G-tube in  place EXT: Trace pedal edema, (+) contractures all extremties Neuro: Able to open eye  PPS: 10%   This conversation/these recommendations were discussed with patient primary care team, Dr. Barrie Folk and [name], NP.  Time In: 0900 Time Out: 1010 Total Time: 70 Greater than 50%  of this time was spent counseling and coordinating care related to the above assessment and plan.  [facility] [place] Palliative Medicine Team Team Cell Phone: [phone] Please utilize secure chat with additional questions, if there is no response within 30 minutes please call the above phone number  Palliative Medicine Team providers are available by phone from 7am to 7pm daily and can be reached through the team cell phone.  Should this patient require assistance outside of these hours, please call the patient's attending physician.

## 2020-03-01 NOTE — Plan of Care (Signed)
  Problem: Clinical Measurements: Goal: Ability to maintain clinical measurements within normal limits will improve Outcome: Progressing Goal: Will remain free from infection Outcome: Progressing Goal: Diagnostic test results will improve Outcome: Progressing Goal: Respiratory complications will improve Outcome: Progressing Goal: Cardiovascular complication will be avoided Outcome: Progressing   Problem: Activity: Goal: Risk for activity intolerance will decrease Outcome: Progressing   Problem: Elimination: Goal: Will not experience complications related to urinary retention Outcome: Progressing   Problem: Pain Managment: Goal: General experience of comfort will improve Outcome: Progressing   Problem: Safety: Goal: Ability to remain free from injury will improve Outcome: Progressing   Problem: Skin Integrity: Goal: Risk for impaired skin integrity will decrease Outcome: Progressing   Problem: Education: Goal: 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 Outcome: Progressing   Problem: Activity: Goal: Ability to tolerate increased activity will improve Outcome: Progressing   Problem: Health Behavior/Discharge Planning: Goal: 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 Outcome: Progressing   Problem: Respiratory: Goal: Patent airway maintenance will improve Outcome: Progressing   Problem: Role Relationship: Goal: Ability to communicate will improve Outcome: Progressing   Problem: Education: Goal: Knowledge of the prescribed therapeutic regimen Outcome: Progressing   Problem: Clinical Measurements: Goal: Neurologic status will improve Outcome: Progressing   Problem: Tissue Perfusion: Goal: Ability to maintain intracranial pressure will improve Outcome: Progressing   Problem: Respiratory: Goal: Will regain and/or maintain adequate ventilation Outcome: Progressing   Problem: Skin Integrity: Goal: Risk for impaired skin integrity will  decrease Outcome: Progressing Goal: Demonstration of wound healing without infection will improve Outcome: Progressing   Problem: Psychosocial: Goal: Ability to verbalize positive feelings about self will improve Outcome: Progressing Goal: Ability to participate in self-care as condition permits will improve Outcome: Progressing Goal: Ability to identify appropriate support needs will improve Outcome: Progressing   Problem: Health Behavior/Discharge Planning: Goal: Ability to manage health-related needs will improve Outcome: Progressing   Problem: Nutritional: Goal: Risk of aspiration will decrease Outcome: Progressing Goal: Dietary intake will improve Outcome: Progressing   Problem: Communication: Goal: Ability to communicate needs accurately will improve Outcome: Progressing

## 2020-03-02 LAB — GLUCOSE, CAPILLARY
Glucose-Capillary: 82 mg/dL (ref 70–99)
Glucose-Capillary: 88 mg/dL (ref 70–99)
Glucose-Capillary: 76 mg/dL (ref 70–99)
Glucose-Capillary: 127 mg/dL — ABNORMAL HIGH (ref 70–99)
Glucose-Capillary: 86 mg/dL (ref 70–99)
Glucose-Capillary: 122 mg/dL — ABNORMAL HIGH (ref 70–99)

## 2020-03-02 NOTE — Plan of Care (Signed)
  Problem: Clinical Measurements: Goal: Will remain free from infection Outcome: Progressing Goal: Diagnostic test results will improve Outcome: Progressing Goal: Respiratory complications will improve Outcome: Progressing Goal: Cardiovascular complication will be avoided Outcome: Progressing   Problem: Activity: Goal: Risk for activity intolerance will decrease Outcome: Progressing   Problem: Nutrition: Goal: Adequate nutrition will be maintained Outcome: Progressing   Problem: Coping: Goal: Level of anxiety will decrease Outcome: Progressing   Problem: Elimination: Goal: Will not experience complications related to bowel motility Outcome: Progressing Goal: Will not experience complications related to urinary retention Outcome: Progressing   Problem: Pain Managment: Goal: General experience of comfort will improve Outcome: Progressing   Problem: Safety: Goal: Ability to remain free from injury will improve Outcome: Progressing   Problem: Skin Integrity: Goal: Risk for impaired skin integrity will decrease Outcome: Progressing   Problem: Education: Goal: 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 Outcome: Progressing   Problem: Activity: Goal: Ability to tolerate increased activity will improve Outcome: Progressing   Problem: Health Behavior/Discharge Planning: Goal: 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 Outcome: Progressing   Problem: Respiratory: Goal: Patent airway maintenance will improve Outcome: Progressing   Problem: Role Relationship: Goal: Ability to communicate will improve Outcome: Progressing   Problem: Education: Goal: Knowledge of the prescribed therapeutic regimen Outcome: Progressing Goal: Knowledge of disease or condition will improve Outcome: Progressing   Problem: Clinical Measurements: Goal: Neurologic status will improve Outcome: Progressing   Problem: Tissue Perfusion: Goal: Ability to maintain  intracranial pressure will improve Outcome: Progressing   Problem: Respiratory: Goal: Will regain and/or maintain adequate ventilation Outcome: Progressing   Problem: Skin Integrity: Goal: Risk for impaired skin integrity will decrease Outcome: Progressing Goal: Demonstration of wound healing without infection will improve Outcome: Progressing   Problem: Psychosocial: Goal: Ability to verbalize positive feelings about self will improve Outcome: Progressing Goal: Ability to participate in self-care as condition permits will improve Outcome: Progressing Goal: Ability to identify appropriate support needs will improve Outcome: Progressing   Problem: Health Behavior/Discharge Planning: Goal: Ability to manage health-related needs will improve Outcome: Progressing   Problem: Nutritional: Goal: Risk of aspiration will decrease Outcome: Progressing Goal: Dietary intake will improve Outcome: Progressing   Problem: Communication: Goal: Ability to communicate needs accurately will improve Outcome: Progressing   

## 2020-03-02 NOTE — Progress Notes (Addendum)
PROGRESS NOTE  Ryan Orozco ZJQ:[id number] DOB: [birth year] DOA: 11/01/2019 PCP: Default, Provider, MD  HPI/Recap of past 86 hours: 55 year old male with HIV, chronic alcohol abuse who was presented to the emergency department after being found unresponsive outside a convinient store. EMS found him pulseless in PEA, unknown downtime, successfully resuscitated and brought to the emergency department. UDS positive for THC and benzodiazepines. CT head unremarkable. Failed extubation trials due to severe anoxic brain injury and had tracheostomy placed. PEG placed on 10/15.  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 This has led to significant pain as well. [name] was consulted with recommendations/input/orders   he may also benefit from Botox injections to both hips to aid in treatment of spasticity.  Subjective: March 02, 2020 Patient seen and examined at bedside. He was quiet when I got in there but he stated wiggling  and morning received to see her I help. His tracheal collar is in place.  He was said to have had a fever on January 26.  Patient has been afebrile  February 28, 2020 Patient seen and examined at bedside he was quiet when regarding upon getting into the room he started making groaning sounds he is nonverbal, but try to make sounds as if to get an attention at the say something as a way of communicating: He has a tracheostomy he is contracted Nurse reported that his PEG tube area was having from previous discharge.   Assessment/Plan: Active Problems:   Cardiac arrest [facility])   Community acquired pneumonia of left upper lobe of lung   Anoxic brain injury ([place])   Alcohol abuse   Acute respiratory failure with hypoxia (HCC)   Vomiting   Pressure injury of skin   Small bowel obstruction (HCC)   Palliative care encounter   Bowel obstruction (HCC)   Chronic respiratory failure  (HCC)   Diastolic dysfunction   Tracheostomy dependent (HCC)   Dysphagia   Protein-calorie malnutrition (HCC)   Seizures (HCC)   Bilateral recurrent inguinal hernia   Muscle spasticity   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HCC)   Tracheobronchitis   Sequela of Corynebacterium infection   Abnormal urinalysis   Urinary tract infection due to Enterococcus   Fever  Anoxic brain injury 2/2 PEA arrest/pain syndrome secondary to hypertonicity/spastic tetraplegia:  -Presumed 2/2 combination alcohol and BZD overdose with UDS positive for THC and BZDs -Significant brain injury with underlying spastic tetraplegia long-term SNF placement recommended-continue PT/OT/SLP -Continue baclofen, gabapentin, scheduled oxycodone and Zanaflex  -Appreciate assistance of Dr. Swartz-12/27 recontactedviasecure chatDr. Naaman Plummer regarding consideration/evaluation for Botox to right hamstrings, rectus femoris -Continue bilateral upper extremity WHO resting splints along with PROM per nursing staff every 4 hours -Continue low-dose Seroquel for brain injury sequela -Continue scheduled Tylenol for pain along with Crea muscle rub -Continue KREG rotational bed  Enterococcus faecalisUTI -Mental status changes as documented above on 12/27 did have T-max of 99.9 on 12/28 despite receiving scheduled Tylenol for pain -Urinalysis highly abnormal with regimental leukocytes, many bacteria and greater than 50 WBC for UTI -Urine culturepositive for pansensitive Enterococcus-empiric Levaquin discontinued on 12/30 in favor of Augmentin and will receive a total of 5 days therapy  Acute hypoxemic respiratory failure/new tracheostomy tube requirement/Corynebacterium tracheobronchitis -Continue PMV training per SLP-per SLP did not do well with attempts to introduce oral substances12 /2;#4 cuffless trach in place -Trach team signed off as of 12/27 given not a candidate for decannulation until patient can reliably follow  commands, cough and clear airway. -Completed 7 days of linezolid forCorynebacteriumtracheobronchitis -continue albuterol nebs -continue physiotherapy with Vibra vest along with supervised PMV trials -Follow-up chest x-ray on 12/24 with low lung volumes but no acute cardiopulmonary findings  HIV: -CD4 count of 124 October 2021(had been as high as 250 Dec 2020)-as of today up to 259 -Dc'd Tivicay and Descovy infavor of Biktarvy to for eventual dc to SNF;per my discussion with infectious disease physician [name] on 12/23 preferred regimen would be Descovy and Tivicay-she will contact the HIV pharmacist to attempt to obtain discount cards to make this a more affordable option. Once this has been confirmed will transition back to Descovy and Tivicay -CD4 count on 12/23 up to 259 -ID recommends continuing prophylactic Bactrim an additional 3 months  Dysphagia 2/2 anoxic brain injury -Continue tube feeding per PEG -Repeat SLP evaluation on 11/23 with recommendation for n.p.o. status; attempts to introduce oral substances on 12/27 but patient did not accept and was pushing out with his tongue. Given his acute UTI he may have been nauseated and this may have been reason for him not wishing to eat.  Goals of care:  -Palliative care was involved during this hospitalization.  -Patient is now DNR  Protein calorie malnutrition nutrition Status: Nutrition Problem: Increased nutrient needs Etiology: acute illness Signs/Symptoms: estimated needs Interventions: Tube feedingbolus doses of Osmolite, Juven twice daily, Prosource TF 45 mL 3 times daily Estimated body mass index is 25.47 kg/m as calculated from the following: Height as of this encounter: 5' 6"  (1.676 m). Weight as of this encounter: 71.6 kg.  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  -Continue Keppra; levetiracetam level 35.9 on 11/12 -No further seizure activity since transferred from ICU.  So seizure precaution has been discontinued  Inguinal hernias -Evaluated by general surgery this admission. Documented as chronically incarcerated. Patient has been clinically stable and tolerating tube feedings and having bowel movements so unless patient obstructed would not be considered a candidate for repair. Even if obstructed consulting surgeon was not sure he would be a candidate for hernia repair regardless .   Code Status: DNR  Severity of Illness: The appropriate patient status for this patient is INPATIENT. Inpatient status is judged to be reasonable and necessary in order to provide the required intensity of service to ensure the patient's safety. The patient's presenting symptoms, physical exam findings, and initial radiographic and laboratory data in the context of their chronic comorbidities is felt to place them at high risk for further clinical deterioration. Furthermore, it is not anticipated that the patient will be medically stable for discharge from the hospital within 2 midnights of admission. The following factors support the patient status of inpatient.   Remains inpatient appropriate because:Altered mental status, Unsafe d/c plan and Inpatient level of care appropriate due to severity of illness. Patient newly diagnosed with anoxic brain injury  * I certify that at the point of admission it is my clinical judgment that the patient will require inpatient hospital care  spanning beyond 2 midnights from the point of admission due  to high intensity of service, high risk for further deterioration and high frequency of surveillance required.*    Family Communication: None at bedside  Disposition Plan: SNF.  Medically stable to be discharged but no bed offers available yet caseworker and social worker continue to work with DSS   Consultants:  Physical medicine  Procedures:  Tracheostomy collar November 18, 2019   PEG tube placement November 17, 2019  Antimicrobials:  None  DVT prophylaxis: Subacute heparin   Objective: Vitals:   03/01/20 2350 03/02/20 0541 03/02/20 0930 03/02/20 1200  BP:  116/82    Pulse: 91 81 100 96  Resp: 20 16 16 15   Temp:  98 F (36.7 C)    TempSrc:      SpO2: 92% 100% 98% 98%  Weight:      Height:        Intake/Output Summary (Last 24 hours) at 03/02/2020 1443 Last data filed at 03/02/2020 1300 Gross per 24 hour  Intake 6304 ml  Output 2000 ml  Net 4304 ml   Filed Weights   02/25/20 0358 02/26/20 0500 02/27/20 0325  Weight: 74.5 kg 71.5 kg 73 kg   Body mass index is 25.98 kg/m.  Exam:  . General: 55 y.o. year-old male well developed well nourished in no acute distress.  Alert . A grunting sound groaning sounds . Cardiovascular: Regular rate and rhythm with no rubs or gallops.  No thyromegaly or JVD noted.    Respiratory: Clear to auscultation with no wheezes or rales. Good inspiratory effort.Tracheal collar in place .  Marland Kitchen Abdomen: Soft nontender nondistended with normal bowel sounds x4 quadrants.  PEG tube in place . Musculoskeletal: Contracted all 4 extremities patient wearing a bilateral specialty hand splints . Skin: Mild  drainage to the PEG site area, . Psychiatry: Mood is appropriate for condition and setting    Data Reviewed: CBC: No results for input(s): WBC, NEUTROABS, HGB, HCT, MCV, PLT in the last 168 hours. Basic Metabolic Panel: No results for input(s): NA, K, CL, CO2, GLUCOSE, BUN,  CREATININE, CALCIUM, MG, PHOS in the last 168 hours. GFR: Estimated Creatinine Clearance: 94.1 mL/min (by C-G formula based on SCr of 0.69 mg/dL). Liver Function Tests: No results for input(s): AST, ALT, ALKPHOS, BILITOT, PROT, ALBUMIN in the last 168 hours. No results for input(s): LIPASE, AMYLASE in the last 168 hours. No results for input(s): AMMONIA in the last 168 hours. Coagulation Profile: No results for input(s): INR, PROTIME in the last 168 hours. Cardiac Enzymes: No results for input(s): CKTOTAL, CKMB, CKMBINDEX, TROPONINI in the last 168 hours. BNP (last 3 results) No results for input(s): PROBNP in the last 8760 hours. HbA1C: No results for input(s): HGBA1C in the last 72 hours. CBG: Recent Labs  Lab 03/01/20 2036 03/01/20 2309 03/02/20 0450 03/02/20 0830 03/02/20 1208  GLUCAP 80 124* 82 88 76   Lipid Profile: No results for input(s): CHOL, HDL, LDLCALC, TRIG, CHOLHDL, LDLDIRECT in the last 72 hours. Thyroid Function Tests: No results for input(s): TSH, T4TOTAL, FREET4, T3FREE, THYROIDAB in the last 72 hours. Anemia Panel: No results for input(s): VITAMINB12, FOLATE, FERRITIN, TIBC, IRON, RETICCTPCT in the last 72 hours. Urine analysis:    Component Value Date/Time   COLORURINE YELLOW 02/15/2020 0124   APPEARANCEUR CLEAR 02/15/2020 0124   LABSPEC 1.017 02/15/2020 0124   PHURINE 5.0 02/15/2020 0124   GLUCOSEU NEGATIVE 02/15/2020 0124   HGBUR NEGATIVE 02/15/2020 0124   BILIRUBINUR NEGATIVE 02/15/2020 0124   KETONESUR NEGATIVE 02/15/2020 0124  PROTEINUR NEGATIVE 02/15/2020 0124   NITRITE NEGATIVE 02/15/2020 0124   LEUKOCYTESUR SMALL (A) 02/15/2020 0124   Sepsis Labs: @LABRCNTIP (procalcitonin:4,lacticidven:4)  ) Recent Results (from the past 240 hour(s))  Aerobic Culture (superficial specimen)     Status: None   Collection Time: 02/28/20  2:54 PM   Specimen: Wound  Result Value Ref Range Status   Specimen Description WOUND PEG SITE  Final   Special  Requests NONE  Final   Gram Stain   Final    ABUNDANT WBC PRESENT,BOTH PMN AND MONONUCLEAR FEW SQUAMOUS EPITHELIAL CELLS PRESENT ABUNDANT GRAM POSITIVE RODS    Culture   Final    RARE PSEUDOMONAS AERUGINOSA ABUNDANT DIPHTHEROIDS(CORYNEBACTERIUM SPECIES) Standardized susceptibility testing for this organism is not available. Performed at [facility], [place] [address] [place], [place] [address]    Report Status 03/01/2020 FINAL  Final   Organism ID, Bacteria PSEUDOMONAS AERUGINOSA  Final      Susceptibility   Pseudomonas aeruginosa - MIC*    CEFTAZIDIME 4 SENSITIVE Sensitive     CIPROFLOXACIN 2 INTERMEDIATE Intermediate     GENTAMICIN <=1 SENSITIVE Sensitive     IMIPENEM 2 SENSITIVE Sensitive     * RARE PSEUDOMONAS AERUGINOSA      Studies: No results found.  Scheduled Meds: . acetaminophen (TYLENOL) oral liquid 160 mg/5 mL  650 mg Per Tube Q6H  . amLODipine  10 mg Per Tube Daily  . baclofen  30 mg Per Tube TID  . chlorhexidine gluconate (MEDLINE KIT)  15 mL Mouth Rinse BID  . Darunavir-Cobicisctat-Emtricitabine-Tenofovir Alafenamide  1 tablet Oral Q breakfast  . famotidine  10.4 mg Per Tube BID  . feeding supplement (OSMOLITE 1.5 CAL)  237 mL Per Tube Q24H  . feeding supplement (OSMOLITE 1.5 CAL)  474 mL Per Tube TID  . feeding supplement (PROSource TF)  45 mL Per Tube TID  . fiber  1 packet Per Tube BID  . folic acid  1 mg Per Tube Daily  . free water  200 mL Per Tube Q4H  . gabapentin  200 mg Per Tube Q8H  . heparin injection (subcutaneous)  5,000 Units Subcutaneous Q8H  . levETIRAcetam  1,500 mg Per Tube BID  . magic mouthwash w/lidocaine  5 mL Oral QID  . Muscle Rub   Topical QID  . oxyCODONE  5 mg Per Tube Q6H  . polyethylene glycol  17 g Per Tube QHS  . QUEtiapine  12.5 mg Per Tube QHS  . scopolamine  1 patch Transdermal Q72H  . sulfamethoxazole-trimethoprim  1 tablet Per Tube Once per day on Mon Wed Fri  . thiamine  100 mg Per Tube Daily  . tiZANidine  6  mg Per Tube Q6H    Continuous Infusions:   LOS: 122 days     Cristal Deer, MD [facility]  To reach me or the doctor on call, go to: [email or web address] Password [facility]  03/02/2020, 2:43 PM

## 2020-03-03 LAB — GLUCOSE, CAPILLARY
Glucose-Capillary: 101 mg/dL — ABNORMAL HIGH (ref 70–99)
Glucose-Capillary: 88 mg/dL (ref 70–99)
Glucose-Capillary: 146 mg/dL — ABNORMAL HIGH (ref 70–99)
Glucose-Capillary: 121 mg/dL — ABNORMAL HIGH (ref 70–99)
Glucose-Capillary: 177 mg/dL — ABNORMAL HIGH (ref 70–99)

## 2020-03-03 NOTE — Progress Notes (Addendum)
PROGRESS NOTE  Ryan Orozco JSE:[id number] DOB: 03-21-1965 DOA: 11/01/2019 PCP: Default, Provider, MD  HPI/Recap of past 65 hours: 55 year old male with HIV, chronic alcohol abuse who was presented to the emergency department after being found unresponsive outside a convinient store. EMS found him pulseless in PEA, unknown downtime, successfully resuscitated and brought to the emergency department. UDS positive for THC and benzodiazepines. CT head unremarkable. Failed extubation trials due to severe anoxic brain injury and had tracheostomy placed. PEG placed on 10/15.  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 This has led to significant pain as well. [name] was consulted with recommendations/input/orders   he may also benefit from Botox injections to both hips to aid in treatment of spasticity.  Subjective: March 03, 2020: Patient seen and examined at bedside..  Nurse denies any new complaints patient is afebrile March 02, 2020 Patient seen and examined at bedside. He was quiet when I got in there but he stated wiggling  and morning received to see her I help. His tracheal collar is in place.  He was said to have had a fever on January 26.  Patient has been afebrile  February 28, 2020 Patient seen and examined at bedside he was quiet when regarding upon getting into the room he started making groaning sounds he is nonverbal, but try to make sounds as if to get an attention at the say something as a way of communicating: He has a tracheostomy he is contracted Nurse reported that his PEG tube area was having from previous discharge.   Assessment/Plan: Active Problems:   Cardiac arrest [facility])   Community acquired pneumonia of left upper lobe of lung   Anoxic brain injury ([place])   Alcohol abuse   Acute respiratory failure with hypoxia (HCC)   Vomiting   Pressure injury of skin   Small  bowel obstruction (HCC)   Palliative care encounter   Bowel obstruction (HCC)   Chronic respiratory failure (HCC)   Diastolic dysfunction   Tracheostomy dependent (HCC)   Dysphagia   Protein-calorie malnutrition (HCC)   Seizures (HCC)   Bilateral recurrent inguinal hernia   Muscle spasticity   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HCC)   Tracheobronchitis   Sequela of Corynebacterium infection   Abnormal urinalysis   Urinary tract infection due to Enterococcus   Fever  Anoxic brain injury 2/2 PEA arrest/pain syndrome secondary to hypertonicity/spastic tetraplegia:  -Presumed 2/2 combination alcohol and BZD overdose with UDS positive for THC and BZDs -Significant brain injury with underlying spastic tetraplegia long-term SNF placement recommended-continue PT/OT/SLP -Continue baclofen, gabapentin, scheduled oxycodone and Zanaflex  -Appreciate assistance of Dr. Swartz-12/27 recontactedviasecure chatDr. Naaman Plummer regarding consideration/evaluation for Botox to right hamstrings, rectus femoris -Continue bilateral upper extremity WHO resting splints along with PROM per nursing staff every 4 hours -Continue low-dose Seroquel for brain injury sequela -Continue scheduled Tylenol for pain along with Crea muscle rub -Continue KREG rotational bed  Enterococcus faecalisUTI -Mental status changes as documented above on 12/27 did have T-max of 99.9 on 12/28 despite receiving scheduled Tylenol for pain -Urinalysis highly abnormal with regimental leukocytes, many bacteria and greater than 50 WBC for UTI -Urine culturepositive for pansensitive Enterococcus-empiric Levaquin discontinued on 12/30 in favor of Augmentin and will receive a total of 5 days therapy  Acute hypoxemic respiratory failure/new tracheostomy tube requirement/Corynebacterium tracheobronchitis -Continue PMV training per SLP-per SLP did not do well with attempts to introduce oral substances12 /2;#4 cuffless trach in  place -  Trach team signed off as of 12/27 given not a candidate for decannulation until patient can reliably follow commands, cough and clear airway. -Completed 7 days of linezolid forCorynebacteriumtracheobronchitis -continue albuterol nebs -continue physiotherapy with Vibra vest along with supervised PMV trials -Follow-up chest x-ray on 12/24 with low lung volumes but no acute cardiopulmonary findings  HIV: -CD4 count of 124 October 2021(had been as high as 250 Dec 2020)-as of today up to 259 -Dc'd Tivicay and Descovy infavor of Biktarvy to for eventual dc to SNF;per my discussion with infectious disease physician [name] on 12/23 preferred regimen would be Descovy and Tivicay-she will contact the HIV pharmacist to attempt to obtain discount cards to make this a more affordable option. Once this has been confirmed will transition back to Descovy and Tivicay -CD4 count on 12/23 up to 259 -ID recommends continuing prophylactic Bactrim an additional 3 months  Dysphagia 2/2 anoxic brain injury -Continue tube feeding per PEG -Repeat SLP evaluation on 11/23 with recommendation for n.p.o. status; attempts to introduce oral substances on 12/27 but patient did not accept and was pushing out with his tongue. Given his acute UTI he may have been nauseated and this may have been reason for him not wishing to eat.  Goals of care:  -Palliative care was involved during this hospitalization.  -Goals of care were discussed. Patient is now DNR.    Protein calorie malnutrition nutrition Status: Nutrition Problem: Increased nutrient needs Etiology: acute illness Signs/Symptoms: estimated needs Interventions: Tube feedingbolus doses of Osmolite, Juven twice daily, Prosource TF 45 mL 3 times daily Estimated body mass index is 25.47 kg/m as calculated from the following: Height as of this encounter: 5' 6"  (1.676 m). Weight as of this encounter: 71.6 kg.  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  -Continue Keppra; levetiracetam level 35.9 on 11/12 -no further seizure activity since transitioned out of ICU setting  seizure precaution has been discontinued  Inguinal hernias -Evaluated by general surgery this admission. Documented as chronically incarcerated. Patient has been clinically stable and tolerating tube feedings and having bowel movements so unless patient obstructed would not be considered a candidate for repair. Even if obstructed consulting surgeon was not sure he would be a candidate for hernia repair regardless .   Code Status: DNR  Severity of Illness: The appropriate patient status for this patient is INPATIENT. Inpatient status is judged to be reasonable and necessary in order to provide the required intensity of service to ensure the patient's safety. The patient's presenting symptoms, physical exam findings, and initial radiographic and laboratory data in the context of their chronic comorbidities is felt to place them at high risk for further clinical deterioration. Furthermore, it is not anticipated that the patient will be medically stable for discharge from the hospital within 2 midnights of admission. The following factors support the patient status of inpatient.   Remains inpatient appropriate because:Altered mental status, Unsafe d/c plan and Inpatient level of care appropriate due to severity of illness. Patient newly  diagnosed with anoxic brain injury  * I certify that at the  point of admission it is my clinical judgment that the patient will require inpatient hospital care spanning beyond 2 midnights from the point of admission due to high intensity of service, high risk for further deterioration and high frequency of surveillance required.*    Family Communication: None at bedside  Disposition Plan: SNF.  Medically stable to be discharged but no bed offers available yet caseworker and social worker continue to work with DSS   Consultants:  Physical medicine  Procedures:  Tracheostomy collar November 18, 2019   PEG tube placement November 17, 2019  Antimicrobials:  None  DVT prophylaxis: Subacute heparin   Objective: Vitals:   03/03/20 0500 03/03/20 1146 03/03/20 1411 03/03/20 1516  BP:   95/64   Pulse:  85 92 94  Resp:  19 17 16   Temp:   98.9 F (37.2 C)   TempSrc:      SpO2:  98% 98% 98%  Weight: 73.5 kg     Height:        Intake/Output Summary (Last 24 hours) at 03/03/2020 1721 Last data filed at 03/03/2020 0143 Gross per 24 hour  Intake -  Output 950 ml  Net -950 ml   Filed Weights   02/26/20 0500 02/27/20 0325 03/03/20 0500  Weight: 71.5 kg 73 kg 73.5 kg   Body mass index is 26.15 kg/m.  Exam:  . General: 55 y.o. year-old male well developed well nourished in no acute distress.  Alert . A grunting sound groaning sounds . Cardiovascular: Regular rate and rhythm with no rubs or gallops.  No thyromegaly or JVD noted.    Respiratory: Clear to auscultation with no wheezes or rales. Good inspiratory effort.Tracheal collar in place .  Marland Kitchen Abdomen: Soft nontender nondistended with normal bowel sounds x4 quadrants.  PEG tube in place PEG site looks okay slightly wet but no active discharge there is granulation tissue around it, and there is no redness . Musculoskeletal: Contracted all 4 extremities . Skin: Mild  drainage to the PEG site area, . Psychiatry: Mood is  appropriate for condition and setting    Data Reviewed: CBC: No results for input(s): WBC, NEUTROABS, HGB, HCT, MCV, PLT in the last 168 hours. Basic Metabolic Panel: No results for input(s): NA, K, CL, CO2, GLUCOSE, BUN, CREATININE, CALCIUM, MG, PHOS in the last 168 hours. GFR: Estimated Creatinine Clearance: 94.1 mL/min (by C-G formula based on SCr of 0.69 mg/dL). Liver Function Tests: No results for input(s): AST, ALT, ALKPHOS, BILITOT, PROT, ALBUMIN in the last 168 hours. No results for input(s): LIPASE, AMYLASE in the last 168 hours. No results for input(s): AMMONIA in the last 168 hours. Coagulation Profile: No results for input(s): INR, PROTIME in the last 168 hours. Cardiac Enzymes: No results for input(s): CKTOTAL, CKMB, CKMBINDEX, TROPONINI in the last 168 hours. BNP (last 3 results) No results for input(s): PROBNP in the last 8760 hours. HbA1C: No results for input(s): HGBA1C in the last 72 hours. CBG: Recent Labs  Lab 03/02/20 2257 03/03/20 0250 03/03/20 0752 03/03/20 1159 03/03/20 1539  GLUCAP 122* 101* 88 146* 121*   Lipid Profile: No results for input(s): CHOL, HDL, LDLCALC, TRIG, CHOLHDL, LDLDIRECT in the last 72 hours. Thyroid Function Tests: No results for input(s): TSH, T4TOTAL, FREET4, T3FREE, THYROIDAB in the last 72 hours. Anemia Panel: No results for input(s): VITAMINB12, FOLATE, FERRITIN, TIBC, IRON, RETICCTPCT in the last 72 hours. Urine analysis:    Component Value Date/Time   COLORURINE YELLOW 02/15/2020 0124   APPEARANCEUR CLEAR  02/15/2020 0124   LABSPEC 1.017 02/15/2020 0124   PHURINE 5.0 02/15/2020 0124   GLUCOSEU NEGATIVE 02/15/2020 0124   HGBUR NEGATIVE 02/15/2020 0124   BILIRUBINUR NEGATIVE 02/15/2020 0124   KETONESUR NEGATIVE 02/15/2020 0124   PROTEINUR NEGATIVE 02/15/2020 0124   NITRITE NEGATIVE 02/15/2020 0124   LEUKOCYTESUR SMALL (A) 02/15/2020 0124   Sepsis Labs: @LABRCNTIP (procalcitonin:4,lacticidven:4)  ) Recent Results  (from the past 240 hour(s))  Aerobic Culture (superficial specimen)     Status: None   Collection Time: 02/28/20  2:54 PM   Specimen: Wound  Result Value Ref Range Status   Specimen Description WOUND PEG SITE  Final   Special Requests NONE  Final   Gram Stain   Final    ABUNDANT WBC PRESENT,BOTH PMN AND MONONUCLEAR FEW SQUAMOUS EPITHELIAL CELLS PRESENT ABUNDANT GRAM POSITIVE RODS    Culture   Final    RARE PSEUDOMONAS AERUGINOSA ABUNDANT DIPHTHEROIDS(CORYNEBACTERIUM SPECIES) Standardized susceptibility testing for this organism is not available. Performed at [facility], [place] [address] [place], [place] [address]    Report Status 03/01/2020 FINAL  Final   Organism ID, Bacteria PSEUDOMONAS AERUGINOSA  Final      Susceptibility   Pseudomonas aeruginosa - MIC*    CEFTAZIDIME 4 SENSITIVE Sensitive     CIPROFLOXACIN 2 INTERMEDIATE Intermediate     GENTAMICIN <=1 SENSITIVE Sensitive     IMIPENEM 2 SENSITIVE Sensitive     * RARE PSEUDOMONAS AERUGINOSA      Studies: No results found.  Scheduled Meds: . acetaminophen (TYLENOL) oral liquid 160 mg/5 mL  650 mg Per Tube Q6H  . amLODipine  10 mg Per Tube Daily  . baclofen  30 mg Per Tube TID  . chlorhexidine gluconate (MEDLINE KIT)  15 mL Mouth Rinse BID  . Darunavir-Cobicisctat-Emtricitabine-Tenofovir Alafenamide  1 tablet Oral Q breakfast  . famotidine  10.4 mg Per Tube BID  . feeding supplement (OSMOLITE 1.5 CAL)  237 mL Per Tube Q24H  . feeding supplement (OSMOLITE 1.5 CAL)  474 mL Per Tube TID  . feeding supplement (PROSource TF)  45 mL Per Tube TID  . fiber  1 packet Per Tube BID  . folic acid  1 mg Per Tube Daily  . free water  200 mL Per Tube Q4H  . gabapentin  200 mg Per Tube Q8H  . heparin injection (subcutaneous)  5,000 Units Subcutaneous Q8H  . levETIRAcetam  1,500 mg Per Tube BID  . magic mouthwash w/lidocaine  5 mL Oral QID  . Muscle Rub   Topical QID  . oxyCODONE  5 mg Per Tube Q6H  . polyethylene glycol   17 g Per Tube QHS  . QUEtiapine  12.5 mg Per Tube QHS  . scopolamine  1 patch Transdermal Q72H  . sulfamethoxazole-trimethoprim  1 tablet Per Tube Once per day on Mon Wed Fri  . thiamine  100 mg Per Tube Daily  . tiZANidine  6 mg Per Tube Q6H    Continuous Infusions:   LOS: 123 days     Cristal Deer, MD [facility]  To reach me or the doctor on call, go to: [email or web address] Password [facility]  03/03/2020, 5:21 PM

## 2020-03-03 NOTE — Plan of Care (Signed)
  Problem: Clinical Measurements: Goal: Will remain free from infection Outcome: Not Progressing Goal: Diagnostic test results will improve Outcome: Not Progressing Goal: Respiratory complications will improve Outcome: Not Progressing Goal: Cardiovascular complication will be avoided Outcome: Not Progressing   Problem: Activity: Goal: Risk for activity intolerance will decrease Outcome: Not Progressing   Problem: Nutrition: Goal: Adequate nutrition will be maintained Outcome: Not Progressing   Problem: Coping: Goal: Level of anxiety will decrease Outcome: Not Progressing   Problem: Elimination: Goal: Will not experience complications related to bowel motility Outcome: Not Progressing Goal: Will not experience complications related to urinary retention Outcome: Not Progressing   Problem: Pain Managment: Goal: General experience of comfort will improve Outcome: Not Progressing   Problem: Safety: Goal: Ability to remain free from injury will improve Outcome: Not Progressing   Problem: Skin Integrity: Goal: Risk for impaired skin integrity will decrease Outcome: Not Progressing   Problem: Education: Goal: Knowledge of the prescribed therapeutic regimen Outcome: Not Progressing Goal: Knowledge of disease or condition will improve Outcome: Not Progressing   Problem: Clinical Measurements: Goal: Neurologic status will improve Outcome: Not Progressing   Problem: Tissue Perfusion: Goal: Ability to maintain intracranial pressure will improve Outcome: Not Progressing   Problem: Respiratory: Goal: Will regain and/or maintain adequate ventilation Outcome: Not Progressing   Problem: Skin Integrity: Goal: Risk for impaired skin integrity will decrease Outcome: Not Progressing Goal: Demonstration of wound healing without infection will improve Outcome: Not Progressing   Problem: Psychosocial: Goal: Ability to verbalize positive feelings about self will  improve Outcome: Not Progressing Goal: Ability to participate in self-care as condition permits will improve Outcome: Not Progressing Goal: Ability to identify appropriate support needs will improve Outcome: Not Progressing   Problem: Health Behavior/Discharge Planning: Goal: Ability to manage health-related needs will improve Outcome: Not Progressing   Problem: Nutritional: Goal: Risk of aspiration will decrease Outcome: Not Progressing Goal: Dietary intake will improve Outcome: Not Progressing   Problem: Communication: Goal: Ability to communicate needs accurately will improve Outcome: Not Progressing   Problem: Education: Goal: 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 Outcome: Not Progressing   Problem: Activity: Goal: Ability to tolerate increased activity will improve Outcome: Not Progressing   Problem: Health Behavior/Discharge Planning: Goal: 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 Outcome: Not Progressing   Problem: Respiratory: Goal: Patent airway maintenance will improve Outcome: Not Progressing

## 2020-03-04 LAB — GLUCOSE, CAPILLARY
Glucose-Capillary: 102 mg/dL — ABNORMAL HIGH (ref 70–99)
Glucose-Capillary: 109 mg/dL — ABNORMAL HIGH (ref 70–99)
Glucose-Capillary: 109 mg/dL — ABNORMAL HIGH (ref 70–99)
Glucose-Capillary: 110 mg/dL — ABNORMAL HIGH (ref 70–99)
Glucose-Capillary: 145 mg/dL — ABNORMAL HIGH (ref 70–99)
Glucose-Capillary: 88 mg/dL (ref 70–99)
Glucose-Capillary: 96 mg/dL (ref 70–99)

## 2020-03-04 MED ORDER — BACITRACIN ZINC 500 UNIT/GM EX OINT
TOPICAL_OINTMENT | Freq: Two times a day (BID) | CUTANEOUS | Status: DC
  Administered 2020-03-13 – 2020-04-06 (×8): 1 via TOPICAL
  Filled 2020-03-04 (×3): qty 28.4

## 2020-03-04 NOTE — Progress Notes (Addendum)
[facility] PROGRESS NOTE  Kenn California NWG:[id number] DOB: 1965/05/12 DOA: 11/01/2019 PCP: Default, Provider, MD    11/16   Status: Remains inpatient appropriate because:Altered mental status, Unsafe d/c plan and Inpatient level of care appropriate due to severity of illness. Patient newly diagnosed with anoxic brain injury  Dispo: The patient is from: Home              Anticipated d/c is to: SNF              Anticipated d/c date is: > 3 days              Patient currently is medically stable to d/c.  Barriers to discharge: No SNF bed offers.  Needs trach capable facility.  Still awaiting approval of Medicaid and disability.  No bed offers.   Code Status: DNR after d/w palliative team on 1/27 Family Communication: 1/24 dtr [name] updated DVT prophylaxis: Subcutaneous heparin Vaccination status: Will order first dose Pfizer COVID-vaccine on 1/10; next week we will order pneumococcal and influenza vaccinations  Foley catheter:  No  HPI: 55 year old male with HIV, chronic alcohol abuse who was presented to the emergency department after being found unresponsive outside a convinient store.  EMS found him pulseless in PEA, unknown downtime, successfully resuscitated and brought to the emergency department.  UDS positive for THC and benzodiazepines.  CT head unremarkable.  Failed extubation trials due to severe anoxic brain injury and had tracheostomy placed.  PEG placed on 10/15.  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  This has led to significant pain as well.  [name] was consulted and recommendations/input/orders highly appreciated.  She may also benefit from Botox injections to both hips to aid in treatment of spasticity.   Significant Hospital Events   9/29 Admit post PEA arrest. UDS positive for THC, benzo's. ETOH 177 10/05 EEG ongoing. Versed restarted overnight due to agitation.  Nicardipine increased.  10/06 Developed tachypnea with WUA, no follow commands off sedation  10/07 Vomiting, TF held / restarted with recurrent vomiting 10/16 tracheostomy collar for 9 hours 10/18->10/19 off vent all night  10/20> TC 11/15 > downsized to #4 Shiley flex CFS  Subjective: Sleeping soundly  Objective: Vitals:   03/03/20 2132 03/04/20 0334  BP:    Pulse: (!) 104 (!) 110  Resp: 20 (!) 24  Temp:    SpO2: 98% 95%   No intake or output data in the 24 hours ending 03/04/20 0813 Filed Weights   02/27/20 0325 03/03/20 0500 03/04/20 0500  Weight: 73 kg 73.5 kg 73 kg    Exam: General: Sleeping, calm, no acute distress Pulmonary:  Trach collar FiO2 21%.  No significant tracheal secretions noted.  Lung sounds clear Cardiac: Heart sounds normal S1-S2, no resting tachycardia, extremities warm to touch Abdomen:  PEG tube feeding/medication.  Normoactive bowel sounds.  LBM 1/29 Neurological: Severe hypertonicity/spasticity bilateral lower extremities involving the hip flexors and hamstrings; primarily keeps bilateral hips flexed and any attempts to extend either leg especially the right leg results in significant pain response.  Limited use of upper extremities with action primarily initiated from the shoulder girth. Psychiatric: Awake, nonverbal except for simple monosyllabic responses such as hey so unable to assess orientation  Assessment/Plan: Acute problems: Anoxic brain injury 2/2 PEA arrest/pain syndrome secondary to hypertonicity/spastic tetraplegia:  -Presumed 2/2 combination alcohol and BZD overdose with UDS positive for THC and BZDs -Significant brain injury with underlying spastic tetraplegia long-term  SNF placement recommended-continue PT/OT/SLP -Continue baclofen, gabapentin, scheduled oxycodone and Zanaflex -as of 1/21 patient seemed to be having increasing pain so have increased frequency of scheduled oxycodone to every 6 hours -Appreciate assistance of Dr.  Naaman Plummer; 1/6 Botox injection into the quadriceps and rectus femoris as well as the biceps femoris and hamstrings, semimembranosus and semitendinosus.  Unable to inject iliopsoas without EMG guidance.  Unfortunately no significant improvement and spasticity hypertonicity. -Continue bilateral upper extremity WHO resting splints along with PROM per nursing staff every 4 hours -Continue low-dose Seroquel for brain injury sequela -Continue KREG rotational bed   Goals of care:  -Palliative care was involved during this hospitalization.    Initially goals of care were discussed and patient remains a full code.  Palliative care team reconsulted on 1/27 and after an extensive discussion daughter does realize that patient quite debilitated and would not survive a resuscitation therefore patient made a DNR  Chronic respiratory failure with inability to maintain patent airway requiring chronic tracheostomy tube/Recent Corynebacterium tracheobronchitis (resolved) -Continue PMV training per SLP-#4 cuffless trach in place-trach changed out on 1/10 RT -Given altered mentation and inability to manage secretions PCCM states patient is not a candidate for elective decannulation -continue physiotherapy with Vibra vest along with supervised PMV trials  Fever/peri-PEG drainage positive for corynebacterium and rare Pseudomonas -Has had no further fevers -PEG site with some drainage but no erythema, induration or pain -Suspect may have colonization noting recently had corynebacterium tracheobronchitis treated -We will begin local treatment with bacitracin ointment  HIV:  -CD4 count of 124 October 2021 (had been as high as 250 Dec 2020)-as of today up to 259 -Emesis recommends repeating viral load which has been ordered on 1/14 -Dc'd Tivicay and Descovy infavor of Biktarvy to for eventual dc to SNF; per my discussion with infectious disease physician [name] on 12/23 preferred regimen would be Descovy and Tivicay-she  will contact the HIV pharmacist to attempt to obtain discount cards to make this a more affordable option.  Once this has been confirmed will transition back to Descovy and Tivicay -CD4 count on 12/23 up to 259 -ID recommends continuing prophylactic Bactrim an additional 3 months  Dysphagia 2/2 anoxic brain injury -Continue tube feeding per PEG -SLP attempted to introduce oral substances on 12/27 but patient did not accept and was pushing out with his tongue.  Given his acute UTI he may have been nauseated and this may have been reason for him not wishing to eat. -Patient also with some redness and exudate on tongue.  Recently treated for oral thrush.  Began Magic mouthwash with lidocaine 1/21 with significant improvement in clinical exam  Constipation -No documented bowel movement since 1/13 -We will continue scheduled Colace, fiber supplements per tube -Add daily MiraLAX -Given one-time dose milk of magnesia on 1/20-no documented bowel movement on flowsheet -Suspect requirement for narcotics and multiple muscle relaxers contributing to decreased GI motility and constipation symptoms  Protein calorie malnutrition nutrition Status: Nutrition Problem: Increased nutrient needs Etiology: acute illness Signs/Symptoms: estimated needs Interventions: Tube feeding bolus doses of Osmolite, Juven twice daily, Prosource TF 45 mL 3 times daily Estimated body mass index is 25.98 kg/m as calculated from the following:   Height as of this encounter: _0  (1.676 m).   Weight as of this encounter: 73 kg.   Multiple decubiti not POA Wound / Incision (Open or Dehisced) 11/17/19 Puncture Abdomen Left;Anterior;Upper G-tube insertion site  (Active)  Date First Assessed/Time First Assessed: 11/17/19 1646  Wound Type: Puncture  Location: Abdomen  Location Orientation: Left;Anterior;Upper  Wound Description (Comments): G-tube insertion site   Present on Admission: No    Assessments 11/17/2019  4:47 PM  03/03/2020  7:34 AM  Dressing Type Gauze (Comment);Tape dressing Gauze (Comment)  Dressing Changed New -  Dressing Status Clean;Dry;Intact Clean;Dry  Dressing Change Frequency PRN -  Site / Wound Assessment Clean;Dry;Pink Clean;Dry  Peri-wound Assessment Intact -  Margins - Unattached edges (unapproximated)  Closure None None  Drainage Amount None -  Treatment Cleansed -     No Linked orders to display      Other problems: Hypertension/grade 1 diastolic dysfunction:  -Continue amlodipine  -Echocardiogram this admission with preserved LVEF with evidence of grade   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   -Continue Keppra; levetiracetam level 35.9 on 11/12 -no further seizure activity since transitioned out of ICU setting therefore will discontinue seizure precaution  SIRS/Fever 2/2 recurrent enterococcus UTI -Patient developed mild elevation in temperature overnight to 1/13 associated with slight increase in respiratory secretions and congested sounding cough/abnormal pulmonary exam as well as tachycardia./14 respiratory status much improved and no further fevers. -Respiratory viral panel neg for COVID, influenza and RSV.   -UA slightly abnormal/cx c/w enterococcus UTI- started on nitrofurantoin (prior UTI treated w/ augmentin) has completed 3 days of treatment -X-ray with bilateral opacities concerning for early pneumonia but CT of the chest without pneumonia, broad-spectrum antibiotics dc'd 1/14 -Because of dehydration increased frequency of free water from 200 cc every 4 hours to every 2 hours on 1/13- stopped on 1/15  Inguinal hernias -Evaluated by general surgery this admission. Documented as chronically incarcerated. Patient has been clinically stable and tolerating tube feedings and having bowel movements so unless patient obstructed would not be considered a candidate for repair. Even if obstructed consulting surgeon was not sure he would be a candidate for  hernia repair regardless  Constipation -No documented bowel movement since 1/13 -We will continue scheduled Colace, fiber supplements per tube -Add daily MiraLAX -Given one-time dose milk of magnesia on 1/20-no documented bowel movement on flowsheet -Suspect requirement for narcotics and multiple muscle relaxers contributing to decreased GI motility and constipation symptoms  Goals of care:  -Palliative care was involved during this hospitalization.   -Goals of care were discussed. remains full code.  -Palliative care recommended outpatient follow-up with palliative providers. -Reconsult Palliative medicine 1/27   Data Reviewed: Basic Metabolic Panel: No results for input(s): NA, K, CL, CO2, GLUCOSE, BUN, CREATININE, CALCIUM, MG, PHOS in the last 168 hours. Liver Function Tests: No results for input(s): AST, ALT, ALKPHOS, BILITOT, PROT, ALBUMIN in the last 168 hours. No results for input(s): LIPASE, AMYLASE in the last 168 hours. No results for input(s): AMMONIA in the last 168 hours. CBC: No results for input(s): WBC, NEUTROABS, HGB, HCT, MCV, PLT in the last 168 hours. Cardiac Enzymes: No results for input(s): CKTOTAL, CKMB, CKMBINDEX, TROPONINI in the last 168 hours. BNP (last 3 results) Recent Labs    02/15/20 0433  BNP 3.9    ProBNP (last 3 results) No results for input(s): PROBNP in the last 8760 hours.  CBG: Recent Labs  Lab 03/03/20 1159 03/03/20 1539 03/03/20 1952 03/04/20 0009 03/04/20 0539  GLUCAP 146* 121* 177* 96 110*    Recent Results (from the past 240 hour(s))  Aerobic Culture (superficial specimen)     Status: None   Collection Time: 02/28/20  2:54 PM   Specimen: Wound  Result Value Ref Range  Status   Specimen Description WOUND PEG SITE  Final   Special Requests NONE  Final   Gram Stain   Final    ABUNDANT WBC PRESENT,BOTH PMN AND MONONUCLEAR FEW SQUAMOUS EPITHELIAL CELLS PRESENT ABUNDANT GRAM POSITIVE RODS    Culture   Final    RARE  PSEUDOMONAS AERUGINOSA ABUNDANT DIPHTHEROIDS(CORYNEBACTERIUM SPECIES) Standardized susceptibility testing for this organism is not available. Performed at [facility], [place] [address], [place], [place] [address]    Report Status 03/01/2020 FINAL  Final   Organism ID, Bacteria PSEUDOMONAS AERUGINOSA  Final      Susceptibility   Pseudomonas aeruginosa - MIC*    CEFTAZIDIME 4 SENSITIVE Sensitive     CIPROFLOXACIN 2 INTERMEDIATE Intermediate     GENTAMICIN <=1 SENSITIVE Sensitive     IMIPENEM 2 SENSITIVE Sensitive     * RARE PSEUDOMONAS AERUGINOSA     Studies: No results found.  Scheduled Meds: . acetaminophen (TYLENOL) oral liquid 160 mg/5 mL  650 mg Per Tube Q6H  . amLODipine  10 mg Per Tube Daily  . baclofen  30 mg Per Tube TID  . chlorhexidine gluconate (MEDLINE KIT)  15 mL Mouth Rinse BID  . Darunavir-Cobicisctat-Emtricitabine-Tenofovir Alafenamide  1 tablet Oral Q breakfast  . famotidine  10.4 mg Per Tube BID  . feeding supplement (OSMOLITE 1.5 CAL)  237 mL Per Tube Q24H  . feeding supplement (OSMOLITE 1.5 CAL)  474 mL Per Tube TID  . feeding supplement (PROSource TF)  45 mL Per Tube TID  . fiber  1 packet Per Tube BID  . folic acid  1 mg Per Tube Daily  . free water  200 mL Per Tube Q4H  . gabapentin  200 mg Per Tube Q8H  . heparin injection (subcutaneous)  5,000 Units Subcutaneous Q8H  . levETIRAcetam  1,500 mg Per Tube BID  . magic mouthwash w/lidocaine  5 mL Oral QID  . Muscle Rub   Topical QID  . oxyCODONE  5 mg Per Tube Q6H  . polyethylene glycol  17 g Per Tube QHS  . QUEtiapine  12.5 mg Per Tube QHS  . scopolamine  1 patch Transdermal Q72H  . sulfamethoxazole-trimethoprim  1 tablet Per Tube Once per day on Mon Wed Fri  . thiamine  100 mg Per Tube Daily  . tiZANidine  6 mg Per Tube Q6H   Continuous Infusions:   Active Problems:   Cardiac arrest [facility])   Community acquired pneumonia of left upper lobe of lung   Anoxic brain injury ([place])   Alcohol  abuse   Acute respiratory failure with hypoxia (HCC)   Vomiting   Pressure injury of skin   Small bowel obstruction (HCC)   Palliative care encounter   Bowel obstruction (HCC)   Chronic respiratory failure (HCC)   Diastolic dysfunction   Tracheostomy dependent (HCC)   Dysphagia   Protein-calorie malnutrition (HCC)   Seizures (HCC)   Bilateral recurrent inguinal hernia   Muscle spasticity   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HCC)   Tracheobronchitis   Sequela of Corynebacterium infection   Abnormal urinalysis   Urinary tract infection due to Enterococcus   Fever   Consultants:  PCCM  Palliative medicine  General surgery  Neurology  Interventional radiology  Procedures:  EEG  Echocardiogram  Tracheostomy  Antibiotics: Anti-infectives (From admission, onward)   Start     Dose/Rate Route Frequency Ordered Stop   02/17/20 1100  nitrofurantoin (FURADANTIN) 25 MG/5ML suspension 100 mg  100 mg Per Tube Every 12 hours 02/17/20 1006 02/20/20 0924   02/15/20 1000  vancomycin (VANCOCIN) IVPB 1000 mg/200 mL premix  Status:  Discontinued        1,000 mg 200 mL/hr over 60 Minutes Intravenous Every 12 hours 02/14/20 2226 02/16/20 0742   02/14/20 2245  piperacillin-tazobactam (ZOSYN) IVPB 3.375 g  Status:  Discontinued        3.375 g 12.5 mL/hr over 240 Minutes Intravenous Every 8 hours 02/14/20 2226 02/16/20 0742   02/14/20 2245  vancomycin (VANCOREADY) IVPB 1500 mg/300 mL        1,500 mg 150 mL/hr over 120 Minutes Intravenous  Once 02/14/20 2226 02/15/20 0739   02/01/20 2200  amoxicillin-clavulanate (AUGMENTIN) 250-62.5 MG/5ML suspension 500 mg        500 mg Per Tube Every 8 hours 02/01/20 1343 02/06/20 1333   01/31/20 1015  levofloxacin (LEVAQUIN) 25 MG/ML solution 500 mg  Status:  Discontinued        500 mg Per Tube Daily 01/31/20 0919 02/01/20 1343   01/31/20 1000  cefTRIAXone (ROCEPHIN) 1 g in sodium chloride 0.9 % 100 mL IVPB  Status:  Discontinued         1 g 200 mL/hr over 30 Minutes Intravenous Every 24 hours 01/31/20 0905 01/31/20 0919   01/19/20 2200  linezolid (ZYVOX) tablet 600 mg        600 mg Per Tube Every 12 hours 01/19/20 1534 01/25/20 2019   01/19/20 1100  levofloxacin (LEVAQUIN) tablet 500 mg  Status:  Discontinued        500 mg Per Tube Daily 01/19/20 1004 01/19/20 1534   12/19/19 0930  Darunavir-Cobicisctat-Emtricitabine-Tenofovir Alafenamide (SYMTUZA) 800-150-200-10 MG TABS 1 tablet        1 tablet Oral Daily with breakfast 12/19/19 0840     12/13/19 1000  bictegravir-emtricitabine-tenofovir AF (BIKTARVY) 50-200-25 MG per tablet 1 tablet  Status:  Discontinued        1 tablet Oral Daily 12/12/19 1413 12/19/19 0840   11/17/19 1548  ceFAZolin (ANCEF) 2-4 GM/100ML-% IVPB       Note to Pharmacy: Domenick Bookbinder   : cabinet override      11/17/19 1548 11/18/19 0359   11/16/19 1515  ceFAZolin (ANCEF) IVPB 2g/100 mL premix        2 g 200 mL/hr over 30 Minutes Intravenous To Radiology 11/16/19 1511 11/17/19 1625   11/15/19 0900  sulfamethoxazole-trimethoprim (BACTRIM DS) 800-160 MG per tablet 1 tablet        1 tablet Per Tube Once per day on Mon Wed Fri 11/13/19 1906     11/13/19 1615  dolutegravir (TIVICAY) tablet 50 mg  Status:  Discontinued        50 mg Oral Daily 11/13/19 1521 12/12/19 1413   11/13/19 1615  emtricitabine-tenofovir AF (DESCOVY) 200-25 MG per tablet 1 tablet  Status:  Discontinued        1 tablet Per Tube Daily 11/13/19 1521 12/12/19 1413   11/13/19 1530  sulfamethoxazole-trimethoprim (BACTRIM DS) 800-160 MG per tablet 1 tablet  Status:  Discontinued        1 tablet Oral Once per day on Mon Wed Fri 11/13/19 1441 11/13/19 1906   11/13/19 1500  bictegravir-emtricitabine-tenofovir AF (BIKTARVY) 50-200-25 MG per tablet 1 tablet  Status:  Discontinued        1 tablet Oral Daily 11/13/19 1403 11/13/19 1521   11/10/19 1000  erythromycin 250 mg in sodium chloride 0.9 % 100 mL IVPB  250 mg 100 mL/hr over 60  Minutes Intravenous Every 8 hours 11/10/19 0907 11/11/19 2000   11/07/19 1200  ceFEPIme (MAXIPIME) 2 g in sodium chloride 0.9 % 100 mL IVPB        2 g 200 mL/hr over 30 Minutes Intravenous Every 8 hours 11/07/19 1013 11/13/19 2127   11/03/19 1200  cefTRIAXone (ROCEPHIN) 2 g in sodium chloride 0.9 % 100 mL IVPB        2 g 200 mL/hr over 30 Minutes Intravenous Every 24 hours 11/03/19 0922 11/05/19 1427   11/02/19 0800  vancomycin (VANCOREADY) IVPB 750 mg/150 mL  Status:  Discontinued        750 mg 150 mL/hr over 60 Minutes Intravenous Every 12 hours 11/01/19 1935 11/02/19 1105   11/02/19 0600  piperacillin-tazobactam (ZOSYN) IVPB 3.375 g  Status:  Discontinued        3.375 g 12.5 mL/hr over 240 Minutes Intravenous Every 8 hours 11/02/19 0311 11/03/19 0920   11/01/19 2200  ceFEPIme (MAXIPIME) 2 g in sodium chloride 0.9 % 100 mL IVPB  Status:  Discontinued        2 g 200 mL/hr over 30 Minutes Intravenous Every 12 hours 11/01/19 2143 11/02/19 0311   11/01/19 1915  vancomycin (VANCOREADY) IVPB 1500 mg/300 mL        1,500 mg 150 mL/hr over 120 Minutes Intravenous  Once 11/01/19 1909 11/01/19 2147   11/01/19 1845  cefTRIAXone (ROCEPHIN) 1 g in sodium chloride 0.9 % 100 mL IVPB        1 g 200 mL/hr over 30 Minutes Intravenous  Once 11/01/19 1842 11/01/19 2051   11/01/19 1845  azithromycin (ZITHROMAX) 500 mg in sodium chloride 0.9 % 250 mL IVPB        500 mg 250 mL/hr over 60 Minutes Intravenous  Once 11/01/19 1842 11/01/19 2051       Time spent: 20 minutes    Erin Hearing ANP  [facility]  7 am-330 pm/M-F for direct patient care and secure chat Please refer to [place] for contact information 03/04/2020, 8:13 AM  LOS: 124 days

## 2020-03-04 NOTE — Progress Notes (Signed)
Nutrition Follow-up  DOCUMENTATION CODES:   Not applicable  INTERVENTION:  Continue bolus TF regimen via PEG: -Provide 2 cartons (417m) Osmolite 1.5 TID @ 0800, 1200, 1700 -Provide 1 carton (2346m Osmolite 1.5 once daily at bedtime @ 2100 -Flush with 6072mree water before and after each tube feeding bolus -Provide 29m63mosource TF TID -Provide Nutrisource fiber BID -Additional free water flushes currently 200ml6m  Tube feeding regimen provides 2605 kcals, 137 grams protein,1267ml 37m water (2947ml w54mflushes)  NUTRITION DIAGNOSIS:   Increased nutrient needs related to acute illness as evidenced by estimated needs. -- ongoing  GOAL:   Patient will meet greater than or equal to 90% of their needs -- met with TF  MONITOR:   Labs,Weight trends,TF tolerance,Skin,I & O's  REASON FOR ASSESSMENT:   Ventilator,Consult Enteral/tube feeding initiation and management  ASSESSMENT:   Pt admitted with anoxic brain injury 2/2 PEA arrest/pain syndrome 2/2 hypertonicity/spastic tetraplegia (presumed to be 2/2 combination of EtOH and BZD overdose with UDS positive for THC and BZDs). PMH includes HIV and EtOH use.  09/29 - intubated 10/07- vomiting, TF held, laterrestarted with recurrent vomiting 10/12-R/L inguinal hernia, partial SBO  10/13- trach 10/15-PEG 10/16 - tolerated TC for 9 hours 10/18->10/19 - off vent overnight 10/20- TC 11/15 - trach downsized to #4 Shiley flex CFS 12/06 - trach changed (same #4 Shiley flex CFS) 1/11- trach changed (same #4 Shiley flex CFS)  Discharge continues to be difficult. Pt has not had SNF bed offers. Pt now pending approval for disability and Medicaid. If/once approved, pt's daughter is hopeful that pt will be able to transfer to facility closer to her in the [place] Pt remains NPO and continues to tolerate bolus feedings via PEG per RN.Current TF regimen is as follows: 2 cartons (474ml) O12mite 1.5 TID, 1 carton  (237ml) Os13mte 1.5 once daily, 60ml free49mer flush before and after each tube feeding bolus + additional FWF of 200ml Q4H, 34m Prosou74mTF TID and Nutrisource fiber BID. Continue with current regimen.   No UOP documented x24 hours. 400ml documen5mso far today.   Medications: pepcid, folic acid, miralax, thiamine Labs reviewed, none since 02/18/20. CBGs [phone]   [phone]:   Diet Order    None      EDUCATION NEEDS:   Not appropriate for education at this time  Skin:  Skin Assessment: Skin Integrity Issues: Skin Integrity Issues:: Other (Comment) Stage II: N/A Stage III: N/A Other: puncture abdomen (PEG insertion site)  Last BM:  1/29  Height:   Ht Readings from Last 1 Encounters:  11/01/19 _0  (1.676 m)    Weight:   Wt Readings from Last 1 Encounters:  03/04/20 73 kg    BMI:  Body mass index is 25.98 kg/m.  Estimated Nutritional Needs:   Kcal:  2400-2600  Protein:  120-135 grams  Fluid:  >2L/d  Harvest Deist AveretLarkin Ina RD pager number and weekend/on-call pager number located in [email or web address]

## 2020-03-04 NOTE — Progress Notes (Signed)
In regards to an order for a sputum culture per respiratory, pt had a non productive cough, even with coaching  pt to deep breathe and cough, NPC noted.  Notified RN and MD of the situation and if she does have a productive cough in the future, a clean specimen cup is available at the bedside.

## 2020-03-05 LAB — GLUCOSE, CAPILLARY
Glucose-Capillary: 99 mg/dL (ref 70–99)
Glucose-Capillary: 107 mg/dL — ABNORMAL HIGH (ref 70–99)
Glucose-Capillary: 162 mg/dL — ABNORMAL HIGH (ref 70–99)
Glucose-Capillary: 150 mg/dL — ABNORMAL HIGH (ref 70–99)
Glucose-Capillary: 101 mg/dL — ABNORMAL HIGH (ref 70–99)
Glucose-Capillary: 161 mg/dL — ABNORMAL HIGH (ref 70–99)

## 2020-03-05 NOTE — Progress Notes (Addendum)
PROGRESS NOTE  Brief Narrative: Ryan Orozco is a 55 y.o. male with a history of HIV and alcohol abuse who was found pulseless outside a convenience store, ROSC achieved by EMS and brought to the ED 11/01/2019. THC, alcohol, and benzodiazepine levels were positive. He was admitted to the ICU and has shown evidence of severe anoxic brain injury, spastic quadriplegia, and continued tracheostomy dependence.   Subjective: Had low grade fever many days ago, but none since without using antimicrobial Tx. Pt communicates only by saying "hey," but does not respond appropriately to any other stimulus.  Objective: BP 110/74 (BP Location: Right Leg)   Pulse 87   Temp 98 F (36.7 C) (Axillary)   Resp 16   Ht 5\' 6"  (1.676 m)   Wt 71.5 kg   SpO2 100%   BMI 25.44 kg/m   Gen: Chronically ill-appearing severely contracted 54yo male. Pulm: Clear and nonlabored  CV: RRR. No JVD or edema GI: Soft, NT, ND, +BS. G tube site has c/d/i dressing with no surrounding erythema or exudate.   Neuro: Alert, does not appear to be oriented and does not follow commands.   Assessment & Plan: It appears discharge is made difficult by the need to secure a payor (awaiting approval for medicaid) and a facility able to care for tracheostomy. PCCM confirms that the patient's altered mentation and inability to manage secretions precludes elective decannulation. This condition, overall, is likely irreversible. Palliative has been involved with family and pt is DNR without plans to convert to comfort measures.  Will continue medications as ordered. G tube site does not clinically appear infected, pt has a multitude of alternative causes of fever (namely atelectasis, chronically incarcerated hernias, risk for aspiration, etc.) and has had no vital sign instability to suggest need for initiating antimicrobial therapy at this time.   We deeply appreciate the assistance from PM&R for spasticity/pain, ID for recommendations  regarding ART and 3 months of continued bactrim ppx, and PCCM for tracheostomy care.   , MD Pager on amion 03/05/2020, 2:29 PM

## 2020-03-06 LAB — GLUCOSE, CAPILLARY
Glucose-Capillary: 106 mg/dL — ABNORMAL HIGH (ref 70–99)
Glucose-Capillary: 97 mg/dL (ref 70–99)
Glucose-Capillary: 125 mg/dL — ABNORMAL HIGH (ref 70–99)
Glucose-Capillary: 157 mg/dL — ABNORMAL HIGH (ref 70–99)
Glucose-Capillary: 77 mg/dL (ref 70–99)
Glucose-Capillary: 105 mg/dL — ABNORMAL HIGH (ref 70–99)

## 2020-03-06 NOTE — TOC Progression Note (Signed)
Transition of Care [facility]) - Progression Note    Patient Details  Name: Ryan Orozco MRN: [id number] Date of Birth: [birth year]  Transition of Care [facility]) CM/SW Contact  Curlene Labrum, RN Phone Number: 03/06/2020, 1:25 PM  Clinical Narrative:    Case management met with the patient at the bedside regarding transitions of care.  The patient currently does not have any SNF facilities that are able to offer a [place] SNF bed for the patient.  The patient's barriers to SNF placement at this point include patient's lack of Long Term Care Medicaid and facility in the area that will accept his level of care  - including trach care and management.  CM and MSW will continue to follow the patient for possible SNf placement.  Patient has community Medicaid at this point.  Financial counseling continues to follow the patient as well to assist in establishment of appropriate Medicaid payor source.  Expected Discharge Plan: Skilled Nursing Facility Barriers to Discharge: Continued Medical Work up,SNF Pending bed offer  Expected Discharge Plan and Services Expected Discharge Plan: [place]   Discharge Planning Services: CM Consult Post Acute Care Choice: Nursing Home Living arrangements for the past 2 months: Single Family Home                                       Social Determinants of Health (SDOH) Interventions    Readmission Risk Interventions Readmission Risk Prevention Plan 02/06/2020  Transportation Screening Complete  PCP or Specialist Appt within 3-5 Days Complete  HRI or [place] Complete  Social Work Consult for [place] Planning/Counseling Complete  Palliative Care Screening Complete  Medication Review Press photographer) Complete  Some recent data might be hidden

## 2020-03-06 NOTE — Progress Notes (Signed)
[facility] PROGRESS NOTE  Anh [place] AYT:[id number] DOB: 1965-09-07 DOA: 11/01/2019 PCP: Default, Provider, MD    11/16   Status: Remains inpatient appropriate because:Altered mental status, Unsafe d/c plan and Inpatient level of care appropriate due to severity of illness. Patient newly diagnosed with anoxic brain injury  Dispo: The patient is from: Home              Anticipated d/c is to: SNF              Anticipated d/c date is: > 3 days              Patient currently is medically stable to d/c.  Barriers to discharge: No SNF bed offers.  Needs trach capable facility.  Still awaiting approval of Medicaid and disability.  No bed offers.   Code Status: DNR after d/w palliative team on 1/27 Family Communication: 1/24 dtr [name] updated DVT prophylaxis: Subcutaneous heparin Vaccination status: Will order first dose Pfizer COVID-vaccine on 1/10; next week we will order pneumococcal and influenza vaccinations  Foley catheter:  No  HPI: 55 year old male with HIV, chronic alcohol abuse who was presented to the emergency department after being found unresponsive outside a convinient store.  EMS found him pulseless in PEA, unknown downtime, successfully resuscitated and brought to the emergency department.  UDS positive for THC and benzodiazepines.  CT head unremarkable.  Failed extubation trials due to severe anoxic brain injury and had tracheostomy placed.  PEG placed on 10/15.  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  This has led to significant pain as well.  [name] was consulted and recommendations/input/orders highly appreciated.  She may also benefit from Botox injections to both hips to aid in treatment of spasticity.   Significant Hospital Events   9/29 Admit post PEA arrest. UDS positive for THC, benzo's. ETOH 177 10/05 EEG ongoing. Versed restarted overnight due to agitation.  Nicardipine increased.  10/06 Developed tachypnea with WUA, no follow commands off sedation  10/07 Vomiting, TF held / restarted with recurrent vomiting 10/16 tracheostomy collar for 9 hours 10/18->10/19 off vent all night  10/20> TC 11/15 > downsized to #4 Shiley flex CFS  Subjective: Awake and interactive.  Does not appear to have any acute distress.  Verbalization limited to monosyllabic words such as 'HEY" or "OW"  Objective: Vitals:   03/06/20 0503 03/06/20 0806  BP: 115/82   Pulse: 86 88  Resp: 20 18  Temp: 99.3 F (37.4 C)   SpO2: 95% 96%    Intake/Output Summary (Last 24 hours) at 03/06/2020 0819 Last data filed at 03/06/2020 0334 Gross per 24 hour  Intake --  Output 1520 ml  Net -1520 ml   Filed Weights   03/04/20 0500 03/05/20 0500 03/06/20 0500  Weight: 73 kg 71.5 kg 72 kg    Exam: General: Awake, calm, no acute distress Pulmonary:  Trach collar FiO2 21%-no excessive secretions.   Cardiac: Normal heart sounds, pulse regular nontachycardic, extremities warm to touch Abdomen:  PEG tube for tube feedings.   LBM 2/2 Neurological: Severe hypertonicity/spasticity bilateral lower extremities involving the hip flexors and hamstrings; primarily keeps bilateral hips flexed -continues to have pain w/ attempts to extend either leg especially the right leg.  Limited use of upper extremities with action primarily initiated from the shoulder girth. Psychiatric: Awake, nonverbal except for simple monosyllabic responses such as hey so unable to assess orientation  Assessment/Plan: Acute problems: Anoxic brain injury  2/2 PEA arrest/pain syndrome secondary to hypertonicity/spastic tetraplegia:  -Presumed 2/2 combination alcohol and BZD overdose with UDS positive for THC and BZDs -Significant brain injury with underlying spastic tetraplegia long-term SNF placement recommended-continue PT/OT/SLP -Continue baclofen, gabapentin, scheduled oxycodone and Zanaflex -as of 1/21 patient  seemed to be having increasing pain so have increased frequency of scheduled oxycodone to every 6 hours -Appreciate assistance of [name]; 1/6 Botox injection into the quadriceps and rectus femoris as well as the biceps femoris and hamstrings, semimembranosus and semitendinosus.  Unable to inject iliopsoas without EMG guidance.  Unfortunately no significant improvement and spasticity hypertonicity. -Continue bilateral upper extremity WHO resting splints along with PROM per nursing staff every 4 hours -Continue low-dose Seroquel for brain injury sequela -Continue KREG rotational bed   Goals of care:  -Palliative care was involved during this hospitalization.    Initially goals of care were discussed and patient remains a full code.  Palliative care team reconsulted on 1/27 and after an extensive discussion daughter does realize that patient quite debilitated and would not survive a resuscitation therefore patient made a DNR  Chronic respiratory failure with inability to maintain patent airway requiring chronic tracheostomy tube/Recent Corynebacterium tracheobronchitis (resolved) -Continue PMV training per SLP-#4 cuffless trach in place-trach changed out on 1/10 RT -Given altered mentation and inability to manage secretions PCCM states patient is not a candidate for elective decannulation -continue physiotherapy with Vibra vest along with supervised PMV trials  Fever/peri-PEG drainage positive for corynebacterium and rare Pseudomonas -Has had no further fevers -PEG site with some drainage but no erythema, induration or pain -Suspect may have colonization noting recently had corynebacterium tracheobronchitis treated -We will begin local treatment with bacitracin ointment  HIV:  -CD4 count of 124 October 2021 (had been as high as 250 Dec 2020)-as of today up to 259; viral load obtained on 1/15 was <20 -Dc'd Tivicay and Descovy infavor of Biktarvy to for eventual dc to SNF; per my discussion with  infectious disease physician [name] on 12/23 preferred regimen would be Descovy and Tivicay-she will contact the HIV pharmacist to attempt to obtain discount cards to make this a more affordable option.  Once this has been confirmed will transition back to Descovy and Tivicay -CD4 count on 12/23 up to 259 -ID recommends continuing prophylactic Bactrim an additional 3 months  Dysphagia 2/2 anoxic brain injury -Continue tube feeding per PEG -SLP attempted to introduce oral substances on 12/27 but patient did not accept and was pushing out with his tongue.  Given his acute UTI he may have been nauseated and this may have been reason for him not wishing to eat.  Constipation -No documented bowel movement since 1/13 -We will continue scheduled Colace, fiber supplements per tube -Add daily MiraLAX -Given one-time dose milk of magnesia on 1/20-no documented bowel movement on flowsheet -Suspect requirement for narcotics and multiple muscle relaxers contributing to decreased GI motility and constipation symptoms  Protein calorie malnutrition nutrition Status: Nutrition Problem: Increased nutrient needs Etiology: acute illness Signs/Symptoms: estimated needs Interventions: Tube feeding bolus doses of Osmolite, Juven twice daily, Prosource TF 45 mL 3 times daily Estimated body mass index is 25.62 kg/m as calculated from the following:   Height as of this encounter: _0  (1.676 m).   Weight as of this encounter: 72 kg.   Multiple decubiti not POA Wound / Incision (Open or Dehisced) 11/17/19 Puncture Abdomen Left;Anterior;Upper G-tube insertion site  (Active)  Date First Assessed/Time First Assessed: 11/17/19 1646   Wound  Type: Puncture  Location: Abdomen  Location Orientation: Left;Anterior;Upper  Wound Description (Comments): G-tube insertion site   Present on Admission: No    Assessments 11/17/2019  4:47 PM 03/03/2020  7:34 AM  Dressing Type Gauze (Comment);Tape dressing Gauze (Comment)   Dressing Changed New --  Dressing Status Clean;Dry;Intact Clean;Dry  Dressing Change Frequency PRN --  Site / Wound Assessment Clean;Dry;Pink Clean;Dry  Peri-wound Assessment Intact --  Margins -- Unattached edges (unapproximated)  Closure None None  Drainage Amount None --  Treatment Cleansed --     No Linked orders to display      Other problems: Hypertension/grade 1 diastolic dysfunction:  -Continue amlodipine  -Echocardiogram this admission with preserved LVEF with evidence of grade   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   -Continue Keppra; levetiracetam level 35.9 on 11/12 -no further seizure activity since transitioned out of ICU setting therefore will discontinue seizure precaution  SIRS/Fever 2/2 recurrent enterococcus UTI -Patient developed mild elevation in temperature overnight to 1/13 associated with slight increase in respiratory secretions and congested sounding cough/abnormal pulmonary exam as well as tachycardia./14 respiratory status much improved and no further fevers. -Respiratory viral panel neg for COVID, influenza and RSV.   -UA slightly abnormal/cx c/w enterococcus UTI- started on nitrofurantoin (prior UTI treated w/ augmentin) has completed 3 days of treatment -X-ray with bilateral opacities concerning for early pneumonia but CT of the chest without pneumonia, broad-spectrum antibiotics dc'd 1/14 -Because of dehydration increased frequency of free water from 200 cc every 4 hours to every 2 hours on 1/13- stopped on 1/15  Inguinal hernias -Evaluated by general surgery this admission. Documented as chronically incarcerated. Patient has been clinically stable and tolerating tube feedings and having bowel movements so unless patient obstructed would not be considered a candidate for repair. Even if obstructed consulting surgeon was not sure he would be a candidate for hernia repair regardless  Constipation -No documented bowel movement  since 1/13 -We will continue scheduled Colace, fiber supplements per tube -Add daily MiraLAX -Given one-time dose milk of magnesia on 1/20-no documented bowel movement on flowsheet -Suspect requirement for narcotics and multiple muscle relaxers contributing to decreased GI motility and constipation symptoms  Goals of care:  -Palliative care was involved during this hospitalization.   -Goals of care were discussed. remains full code.  -Palliative care recommended outpatient follow-up with palliative providers. -Reconsult Palliative medicine 1/27   Data Reviewed: Basic Metabolic Panel: No results for input(s): NA, K, CL, CO2, GLUCOSE, BUN, CREATININE, CALCIUM, MG, PHOS in the last 168 hours. Liver Function Tests: No results for input(s): AST, ALT, ALKPHOS, BILITOT, PROT, ALBUMIN in the last 168 hours. No results for input(s): LIPASE, AMYLASE in the last 168 hours. No results for input(s): AMMONIA in the last 168 hours. CBC: No results for input(s): WBC, NEUTROABS, HGB, HCT, MCV, PLT in the last 168 hours. Cardiac Enzymes: No results for input(s): CKTOTAL, CKMB, CKMBINDEX, TROPONINI in the last 168 hours. BNP (last 3 results) Recent Labs    02/15/20 0433  BNP 3.9    ProBNP (last 3 results) No results for input(s): PROBNP in the last 8760 hours.  CBG: Recent Labs  Lab 03/05/20 1218 03/05/20 1610 03/05/20 2022 03/05/20 2301 03/06/20 0331  GLUCAP 162* 150* 101* 161* 106*    Recent Results (from the past 240 hour(s))  Aerobic Culture (superficial specimen)     Status: None   Collection Time: 02/28/20  2:54 PM   Specimen: Wound  Result Value Ref Range Status  Specimen Description WOUND PEG SITE  Final   Special Requests NONE  Final   Gram Stain   Final    ABUNDANT WBC PRESENT,BOTH PMN AND MONONUCLEAR FEW SQUAMOUS EPITHELIAL CELLS PRESENT ABUNDANT GRAM POSITIVE RODS    Culture   Final    RARE PSEUDOMONAS AERUGINOSA ABUNDANT DIPHTHEROIDS(CORYNEBACTERIUM  SPECIES) Standardized susceptibility testing for this organism is not available. Performed at [facility], [place] [address], [place], [place] [address]    Report Status 03/01/2020 FINAL  Final   Organism ID, Bacteria PSEUDOMONAS AERUGINOSA  Final      Susceptibility   Pseudomonas aeruginosa - MIC*    CEFTAZIDIME 4 SENSITIVE Sensitive     CIPROFLOXACIN 2 INTERMEDIATE Intermediate     GENTAMICIN <=1 SENSITIVE Sensitive     IMIPENEM 2 SENSITIVE Sensitive     * RARE PSEUDOMONAS AERUGINOSA     Studies: No results found.  Scheduled Meds: . acetaminophen (TYLENOL) oral liquid 160 mg/5 mL  650 mg Per Tube Q6H  . amLODipine  10 mg Per Tube Daily  . bacitracin   Topical BID  . baclofen  30 mg Per Tube TID  . chlorhexidine gluconate (MEDLINE KIT)  15 mL Mouth Rinse BID  . Darunavir-Cobicisctat-Emtricitabine-Tenofovir Alafenamide  1 tablet Oral Q breakfast  . famotidine  10.4 mg Per Tube BID  . feeding supplement (OSMOLITE 1.5 CAL)  237 mL Per Tube Q24H  . feeding supplement (OSMOLITE 1.5 CAL)  474 mL Per Tube TID  . feeding supplement (PROSource TF)  45 mL Per Tube TID  . fiber  1 packet Per Tube BID  . folic acid  1 mg Per Tube Daily  . free water  200 mL Per Tube Q4H  . gabapentin  200 mg Per Tube Q8H  . heparin injection (subcutaneous)  5,000 Units Subcutaneous Q8H  . levETIRAcetam  1,500 mg Per Tube BID  . magic mouthwash w/lidocaine  5 mL Oral QID  . Muscle Rub   Topical QID  . oxyCODONE  5 mg Per Tube Q6H  . polyethylene glycol  17 g Per Tube QHS  . QUEtiapine  12.5 mg Per Tube QHS  . scopolamine  1 patch Transdermal Q72H  . sulfamethoxazole-trimethoprim  1 tablet Per Tube Once per day on Mon Wed Fri  . thiamine  100 mg Per Tube Daily  . tiZANidine  6 mg Per Tube Q6H   Continuous Infusions:   Active Problems:   Cardiac arrest [facility])   Community acquired pneumonia of left upper lobe of lung   Anoxic brain injury ([place])   Alcohol abuse   Acute respiratory failure with  hypoxia (HCC)   Vomiting   Pressure injury of skin   Small bowel obstruction (HCC)   Palliative care encounter   Bowel obstruction (HCC)   Chronic respiratory failure (HCC)   Diastolic dysfunction   Tracheostomy dependent (HCC)   Dysphagia   Protein-calorie malnutrition (HCC)   Seizures (HCC)   Bilateral recurrent inguinal hernia   Muscle spasticity   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HCC)   Tracheobronchitis   Sequela of Corynebacterium infection   Abnormal urinalysis   Urinary tract infection due to Enterococcus   Fever   Consultants:  PCCM  Palliative medicine  General surgery  Neurology  Interventional radiology  Procedures:  EEG  Echocardiogram  Tracheostomy  Antibiotics: Anti-infectives (From admission, onward)   Start     Dose/Rate Route Frequency Ordered Stop   02/17/20 1100  nitrofurantoin (FURADANTIN) 25 MG/5ML suspension 100  mg        100 mg Per Tube Every 12 hours 02/17/20 1006 02/20/20 0924   02/15/20 1000  vancomycin (VANCOCIN) IVPB 1000 mg/200 mL premix  Status:  Discontinued        1,000 mg 200 mL/hr over 60 Minutes Intravenous Every 12 hours 02/14/20 2226 02/16/20 0742   02/14/20 2245  piperacillin-tazobactam (ZOSYN) IVPB 3.375 g  Status:  Discontinued        3.375 g 12.5 mL/hr over 240 Minutes Intravenous Every 8 hours 02/14/20 2226 02/16/20 0742   02/14/20 2245  vancomycin (VANCOREADY) IVPB 1500 mg/300 mL        1,500 mg 150 mL/hr over 120 Minutes Intravenous  Once 02/14/20 2226 02/15/20 0739   02/01/20 2200  amoxicillin-clavulanate (AUGMENTIN) 250-62.5 MG/5ML suspension 500 mg        500 mg Per Tube Every 8 hours 02/01/20 1343 02/06/20 1333   01/31/20 1015  levofloxacin (LEVAQUIN) 25 MG/ML solution 500 mg  Status:  Discontinued        500 mg Per Tube Daily 01/31/20 0919 02/01/20 1343   01/31/20 1000  cefTRIAXone (ROCEPHIN) 1 g in sodium chloride 0.9 % 100 mL IVPB  Status:  Discontinued        1 g 200 mL/hr over 30 Minutes  Intravenous Every 24 hours 01/31/20 0905 01/31/20 0919   01/19/20 2200  linezolid (ZYVOX) tablet 600 mg        600 mg Per Tube Every 12 hours 01/19/20 1534 01/25/20 2019   01/19/20 1100  levofloxacin (LEVAQUIN) tablet 500 mg  Status:  Discontinued        500 mg Per Tube Daily 01/19/20 1004 01/19/20 1534   12/19/19 0930  Darunavir-Cobicisctat-Emtricitabine-Tenofovir Alafenamide (SYMTUZA) 800-150-200-10 MG TABS 1 tablet        1 tablet Oral Daily with breakfast 12/19/19 0840     12/13/19 1000  bictegravir-emtricitabine-tenofovir AF (BIKTARVY) 50-200-25 MG per tablet 1 tablet  Status:  Discontinued        1 tablet Oral Daily 12/12/19 1413 12/19/19 0840   11/17/19 1548  ceFAZolin (ANCEF) 2-4 GM/100ML-% IVPB       Note to Pharmacy: Domenick Bookbinder   : cabinet override      11/17/19 1548 11/18/19 0359   11/16/19 1515  ceFAZolin (ANCEF) IVPB 2g/100 mL premix        2 g 200 mL/hr over 30 Minutes Intravenous To Radiology 11/16/19 1511 11/17/19 1625   11/15/19 0900  sulfamethoxazole-trimethoprim (BACTRIM DS) 800-160 MG per tablet 1 tablet        1 tablet Per Tube Once per day on Mon Wed Fri 11/13/19 1906     11/13/19 1615  dolutegravir (TIVICAY) tablet 50 mg  Status:  Discontinued        50 mg Oral Daily 11/13/19 1521 12/12/19 1413   11/13/19 1615  emtricitabine-tenofovir AF (DESCOVY) 200-25 MG per tablet 1 tablet  Status:  Discontinued        1 tablet Per Tube Daily 11/13/19 1521 12/12/19 1413   11/13/19 1530  sulfamethoxazole-trimethoprim (BACTRIM DS) 800-160 MG per tablet 1 tablet  Status:  Discontinued        1 tablet Oral Once per day on Mon Wed Fri 11/13/19 1441 11/13/19 1906   11/13/19 1500  bictegravir-emtricitabine-tenofovir AF (BIKTARVY) 50-200-25 MG per tablet 1 tablet  Status:  Discontinued        1 tablet Oral Daily 11/13/19 1403 11/13/19 1521   11/10/19 1000  erythromycin 250 mg in sodium  chloride 0.9 % 100 mL IVPB        250 mg 100 mL/hr over 60 Minutes Intravenous Every 8 hours  11/10/19 0907 11/11/19 2000   11/07/19 1200  ceFEPIme (MAXIPIME) 2 g in sodium chloride 0.9 % 100 mL IVPB        2 g 200 mL/hr over 30 Minutes Intravenous Every 8 hours 11/07/19 1013 11/13/19 2127   11/03/19 1200  cefTRIAXone (ROCEPHIN) 2 g in sodium chloride 0.9 % 100 mL IVPB        2 g 200 mL/hr over 30 Minutes Intravenous Every 24 hours 11/03/19 0922 11/05/19 1427   11/02/19 0800  vancomycin (VANCOREADY) IVPB 750 mg/150 mL  Status:  Discontinued        750 mg 150 mL/hr over 60 Minutes Intravenous Every 12 hours 11/01/19 1935 11/02/19 1105   11/02/19 0600  piperacillin-tazobactam (ZOSYN) IVPB 3.375 g  Status:  Discontinued        3.375 g 12.5 mL/hr over 240 Minutes Intravenous Every 8 hours 11/02/19 0311 11/03/19 0920   11/01/19 2200  ceFEPIme (MAXIPIME) 2 g in sodium chloride 0.9 % 100 mL IVPB  Status:  Discontinued        2 g 200 mL/hr over 30 Minutes Intravenous Every 12 hours 11/01/19 2143 11/02/19 0311   11/01/19 1915  vancomycin (VANCOREADY) IVPB 1500 mg/300 mL        1,500 mg 150 mL/hr over 120 Minutes Intravenous  Once 11/01/19 1909 11/01/19 2147   11/01/19 1845  cefTRIAXone (ROCEPHIN) 1 g in sodium chloride 0.9 % 100 mL IVPB        1 g 200 mL/hr over 30 Minutes Intravenous  Once 11/01/19 1842 11/01/19 2051   11/01/19 1845  azithromycin (ZITHROMAX) 500 mg in sodium chloride 0.9 % 250 mL IVPB        500 mg 250 mL/hr over 60 Minutes Intravenous  Once 11/01/19 1842 11/01/19 2051       Time spent: 20 minutes    Erin Hearing ANP  [facility]  7 am-330 pm/M-F for direct patient care and secure chat Please refer to [place] for contact information 03/06/2020, 8:19 AM  LOS: 126 days

## 2020-03-07 LAB — GLUCOSE, CAPILLARY
Glucose-Capillary: 103 mg/dL — ABNORMAL HIGH (ref 70–99)
Glucose-Capillary: 105 mg/dL — ABNORMAL HIGH (ref 70–99)
Glucose-Capillary: 140 mg/dL — ABNORMAL HIGH (ref 70–99)
Glucose-Capillary: 141 mg/dL — ABNORMAL HIGH (ref 70–99)
Glucose-Capillary: 148 mg/dL — ABNORMAL HIGH (ref 70–99)
Glucose-Capillary: 175 mg/dL — ABNORMAL HIGH (ref 70–99)

## 2020-03-07 NOTE — Progress Notes (Signed)
[facility] PROGRESS NOTE  Schylar [place] SLH:[id number] DOB: 23-Jul-1965 DOA: 11/01/2019 PCP: Default, Provider, MD    11/16   Status: Remains inpatient appropriate because:Altered mental status, Unsafe d/c plan and Inpatient level of care appropriate due to severity of illness. Patient newly diagnosed with anoxic brain injury  Dispo: The patient is from: Home              Anticipated d/c is to: SNF              Anticipated d/c date is: > 3 days              Patient currently is medically stable to d/c.  Barriers to discharge: No SNF bed offers.  Needs trach capable facility.  Still awaiting approval of Medicaid and disability.  No bed offers.   Code Status: DNR after d/w palliative team on 1/27 Family Communication: 1/24 dtr [name] updated DVT prophylaxis: Subcutaneous heparin Vaccination status: Will order first dose Pfizer COVID-vaccine on 1/10; next week we will order pneumococcal and influenza vaccinations  Foley catheter:  No  HPI: 55 year old male with HIV, chronic alcohol abuse who was presented to the emergency department after being found unresponsive outside a convinient store.  EMS found him pulseless in PEA, unknown downtime, successfully resuscitated and brought to the emergency department.  UDS positive for THC and benzodiazepines.  CT head unremarkable.  Failed extubation trials due to severe anoxic brain injury and had tracheostomy placed.  PEG placed on 10/15.  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  This has led to significant pain as well.  [name] was consulted and recommendations/input/orders highly appreciated.  She may also benefit from Botox injections to both hips to aid in treatment of spasticity.   Significant Hospital Events   9/29 Admit post PEA arrest. UDS positive for THC, benzo's. ETOH 177 10/05 EEG ongoing. Versed restarted overnight due to agitation.  Nicardipine increased.  10/06 Developed tachypnea with WUA, no follow commands off sedation  10/07 Vomiting, TF held / restarted with recurrent vomiting 10/16 tracheostomy collar for 9 hours 10/18->10/19 off vent all night  10/20> TC 11/15 > downsized to #4 Shiley flex CFS  Subjective: Awake and when I engaged the patient he began smiling.  Again with nonsensical or otherwise unintelligible speech.  Objective: Vitals:   03/07/20 0505 03/07/20 0700  BP: 133/82 121/81  Pulse: 86 84  Resp: 18 18  Temp: 99 F (37.2 C) 99.4 F (37.4 C)  SpO2: 95%     Intake/Output Summary (Last 24 hours) at 03/07/2020 0804 Last data filed at 03/07/2020 1572 Gross per 24 hour  Intake -  Output 1250 ml  Net -1250 ml   Filed Weights   03/04/20 0500 03/05/20 0500 03/06/20 0500  Weight: 73 kg 71.5 kg 72 kg    Exam: General: Awake, calm, no acute distress Pulmonary: Lung sounds are clear to auscultation with no increased work of breathing, trach collar-no significant tracheal secretions noted- FiO2 21%  Cardiac: Normotensive, normal heart sounds, pulse regular, extremities warm to touch Abdomen:  PEG tube for tube feedings and medications.  Abdomen soft and nontender.  Normoactive bowel sounds.  LBM 2/2 Neurological: Severe hypertonicity/spasticity bilateral lower extremities involving the hip flexors and hamstrings; primarily keeps bilateral hips flexed -continues to have pain w/ attempts to extend either leg especially the right leg.  Limited use of upper extremities with action primarily initiated from the shoulder girth. Psychiatric: Awake, nonverbal  except for simple monosyllabic responses such as hey so unable to assess orientation  Assessment/Plan: Acute problems: Anoxic brain injury 2/2 PEA arrest/pain syndrome secondary to hypertonicity/spastic tetraplegia:  -Presumed 2/2 combination alcohol and BZD overdose with UDS positive for THC and BZDs -Significant brain injury with underlying spastic  tetraplegia long-term SNF placement recommended-continue PT/OT/SLP -Continue baclofen, gabapentin, scheduled oxycodone and Zanaflex -as of 1/21 patient seemed to be having increasing pain so have increased frequency of scheduled oxycodone to every 6 hours -Appreciate assistance of [name]; 1/6 Botox injection into the quadriceps and rectus femoris as well as the biceps femoris and hamstrings, semimembranosus and semitendinosus.  Unable to inject iliopsoas without EMG guidance.  Unfortunately no significant improvement and spasticity hypertonicity. -Continue bilateral upper extremity WHO resting splints along with PROM per nursing staff every 4 hours -Continue low-dose Seroquel for brain injury sequela -Continue KREG rotational bed   Goals of care:  -Palliative care was involved during this hospitalization.    Initially goals of care were discussed and patient remains a full code.  Palliative care team reconsulted on 1/27 and after an extensive discussion daughter does realize that patient quite debilitated and would not survive a resuscitation therefore patient made a DNR  Chronic respiratory failure with inability to maintain patent airway requiring chronic tracheostomy tube/Recent Corynebacterium tracheobronchitis (resolved) -Continue #4 cuffless trach in place-trach changed out on 1/10 RT -Given altered mentation and inability to manage secretions PCCM states patient is not a candidate for elective decannulation  Fever/peri-PEG drainage positive for corynebacterium and rare Pseudomonas -PEG site with some drainage but no erythema, induration or pain -Suspect may have colonization noting recently had corynebacterium tracheobronchitis treated -continue bacitracin ointment  HIV:  -CD4 count of 124 October 2021 (had been as high as 250 Dec 2020)-as of today up to 259; viral load obtained on 1/15 was <20 -Dc'd Tivicay and Descovy infavor of Biktarvy to for eventual dc to SNF; per my discussion  with infectious disease physician [name] on 12/23 preferred regimen would be Descovy and Tivicay-she will contact the HIV pharmacist to attempt to obtain discount cards to make this a more affordable option.  Once this has been confirmed will transition back to Descovy and Tivicay -CD4 count on 12/23 up to 259 -ID recommends continuing prophylactic Bactrim an additional 3 months  Dysphagia 2/2 anoxic brain injury -Continue tube feeding per PEG -SLP attempted to introduce oral substances on 12/27 but patient did not accept and was pushing out with his tongue.  Given his acute UTI he may have been nauseated and this may have been reason for him not wishing to eat.  Constipation -No documented bowel movement since 1/13 -We will continue scheduled Colace, fiber supplements per tube -Add daily MiraLAX -Given one-time dose milk of magnesia on 1/20-no documented bowel movement on flowsheet -Suspect requirement for narcotics and multiple muscle relaxers contributing to decreased GI motility and constipation symptoms  Protein calorie malnutrition nutrition Status: Nutrition Problem: Increased nutrient needs Etiology: acute illness Signs/Symptoms: estimated needs Interventions: Tube feeding bolus doses of Osmolite, Juven twice daily, Prosource TF 45 mL 3 times daily Estimated body mass index is 25.62 kg/m as calculated from the following:   Height as of this encounter: 5' 6"  (1.676 m).   Weight as of this encounter: 72 kg.   Multiple decubiti not POA Wound / Incision (Open or Dehisced) 11/17/19 Puncture Abdomen Left;Anterior;Upper G-tube insertion site  (Active)  Date First Assessed/Time First Assessed: 11/17/19 1646   Wound Type: Puncture  Location: Abdomen  Location Orientation: Left;Anterior;Upper  Wound Description (Comments): G-tube insertion site   Present on Admission: No    Assessments 11/17/2019  4:47 PM 03/03/2020  7:34 AM  Dressing Type Gauze (Comment);Tape dressing Gauze (Comment)   Dressing Changed New -  Dressing Status Clean;Dry;Intact Clean;Dry  Dressing Change Frequency PRN -  Site / Wound Assessment Clean;Dry;Pink Clean;Dry  Peri-wound Assessment Intact -  Margins - Unattached edges (unapproximated)  Closure None None  Drainage Amount None -  Treatment Cleansed -     No Linked orders to display      Other problems: Hypertension/grade 1 diastolic dysfunction:  -Continue amlodipine  -Echocardiogram this admission with preserved LVEF with evidence of grade   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   -Continue Keppra; levetiracetam level 35.9 on 11/12 -no further seizure activity since transitioned out of ICU setting therefore will discontinue seizure precaution  SIRS/Fever 2/2 recurrent enterococcus UTI -Patient developed mild elevation in temperature overnight to 1/13 associated with slight increase in respiratory secretions and congested sounding cough/abnormal pulmonary exam as well as tachycardia./14 respiratory status much improved and no further fevers. -Respiratory viral panel neg for COVID, influenza and RSV.   -UA slightly abnormal/cx c/w enterococcus UTI- started on nitrofurantoin (prior UTI treated w/ augmentin) has completed 3 days of treatment -X-ray with bilateral opacities concerning for early pneumonia but CT of the chest without pneumonia, broad-spectrum antibiotics dc'd 1/14 -Because of dehydration increased frequency of free water from 200 cc every 4 hours to every 2 hours on 1/13- stopped on 1/15  Inguinal hernias -Evaluated by general surgery this admission. Documented as chronically incarcerated. Patient has been clinically stable and tolerating tube feedings and having bowel movements so unless patient obstructed would not be considered a candidate for repair. Even if obstructed consulting surgeon was not sure he would be a candidate for hernia repair regardless  Constipation -No documented bowel movement since  1/13 -We will continue scheduled Colace, fiber supplements per tube -Add daily MiraLAX -Given one-time dose milk of magnesia on 1/20-no documented bowel movement on flowsheet -Suspect requirement for narcotics and multiple muscle relaxers contributing to decreased GI motility and constipation symptoms  Goals of care:  -Palliative care was involved during this hospitalization.   -Goals of care were discussed. remains full code.  -Palliative care recommended outpatient follow-up with palliative providers. -Reconsult Palliative medicine 1/27   Data Reviewed: Basic Metabolic Panel: No results for input(s): NA, K, CL, CO2, GLUCOSE, BUN, CREATININE, CALCIUM, MG, PHOS in the last 168 hours. Liver Function Tests: No results for input(s): AST, ALT, ALKPHOS, BILITOT, PROT, ALBUMIN in the last 168 hours. No results for input(s): LIPASE, AMYLASE in the last 168 hours. No results for input(s): AMMONIA in the last 168 hours. CBC: No results for input(s): WBC, NEUTROABS, HGB, HCT, MCV, PLT in the last 168 hours. Cardiac Enzymes: No results for input(s): CKTOTAL, CKMB, CKMBINDEX, TROPONINI in the last 168 hours. BNP (last 3 results) Recent Labs    02/15/20 0433  BNP 3.9    ProBNP (last 3 results) No results for input(s): PROBNP in the last 8760 hours.  CBG: Recent Labs  Lab 03/06/20 1641 03/06/20 1954 03/06/20 2259 03/07/20 0323 03/07/20 0749  GLUCAP 157* 77 105* 105* 140*    Recent Results (from the past 240 hour(s))  Aerobic Culture (superficial specimen)     Status: None   Collection Time: 02/28/20  2:54 PM   Specimen: Wound  Result Value Ref Range Status   Specimen  Description WOUND PEG SITE  Final   Special Requests NONE  Final   Gram Stain   Final    ABUNDANT WBC PRESENT,BOTH PMN AND MONONUCLEAR FEW SQUAMOUS EPITHELIAL CELLS PRESENT ABUNDANT GRAM POSITIVE RODS    Culture   Final    RARE PSEUDOMONAS AERUGINOSA ABUNDANT DIPHTHEROIDS(CORYNEBACTERIUM  SPECIES) Standardized susceptibility testing for this organism is not available. Performed at [facility], [place] [address], [place], [place] [address]    Report Status 03/01/2020 FINAL  Final   Organism ID, Bacteria PSEUDOMONAS AERUGINOSA  Final      Susceptibility   Pseudomonas aeruginosa - MIC*    CEFTAZIDIME 4 SENSITIVE Sensitive     CIPROFLOXACIN 2 INTERMEDIATE Intermediate     GENTAMICIN <=1 SENSITIVE Sensitive     IMIPENEM 2 SENSITIVE Sensitive     * RARE PSEUDOMONAS AERUGINOSA     Studies: No results found.  Scheduled Meds: . acetaminophen (TYLENOL) oral liquid 160 mg/5 mL  650 mg Per Tube Q6H  . amLODipine  10 mg Per Tube Daily  . bacitracin   Topical BID  . baclofen  30 mg Per Tube TID  . chlorhexidine gluconate (MEDLINE KIT)  15 mL Mouth Rinse BID  . Darunavir-Cobicisctat-Emtricitabine-Tenofovir Alafenamide  1 tablet Oral Q breakfast  . famotidine  10.4 mg Per Tube BID  . feeding supplement (OSMOLITE 1.5 CAL)  237 mL Per Tube Q24H  . feeding supplement (OSMOLITE 1.5 CAL)  474 mL Per Tube TID  . feeding supplement (PROSource TF)  45 mL Per Tube TID  . fiber  1 packet Per Tube BID  . folic acid  1 mg Per Tube Daily  . free water  200 mL Per Tube Q4H  . gabapentin  200 mg Per Tube Q8H  . heparin injection (subcutaneous)  5,000 Units Subcutaneous Q8H  . levETIRAcetam  1,500 mg Per Tube BID  . magic mouthwash w/lidocaine  5 mL Oral QID  . Muscle Rub   Topical QID  . oxyCODONE  5 mg Per Tube Q6H  . polyethylene glycol  17 g Per Tube QHS  . QUEtiapine  12.5 mg Per Tube QHS  . scopolamine  1 patch Transdermal Q72H  . sulfamethoxazole-trimethoprim  1 tablet Per Tube Once per day on Mon Wed Fri  . thiamine  100 mg Per Tube Daily  . tiZANidine  6 mg Per Tube Q6H   Continuous Infusions:   Active Problems:   Cardiac arrest [facility])   Community acquired pneumonia of left upper lobe of lung   Anoxic brain injury ([place])   Alcohol abuse   Acute respiratory failure with  hypoxia (HCC)   Vomiting   Pressure injury of skin   Small bowel obstruction (HCC)   Palliative care encounter   Bowel obstruction (HCC)   Chronic respiratory failure (HCC)   Diastolic dysfunction   Tracheostomy dependent (HCC)   Dysphagia   Protein-calorie malnutrition (HCC)   Seizures (HCC)   Bilateral recurrent inguinal hernia   Muscle spasticity   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HCC)   Tracheobronchitis   Sequela of Corynebacterium infection   Abnormal urinalysis   Urinary tract infection due to Enterococcus   Fever   Consultants:  PCCM  Palliative medicine  General surgery  Neurology  Interventional radiology  Procedures:  EEG  Echocardiogram  Tracheostomy  Antibiotics: Anti-infectives (From admission, onward)   Start     Dose/Rate Route Frequency Ordered Stop   02/17/20 1100  nitrofurantoin (FURADANTIN) 25 MG/5ML suspension 100 mg  100 mg Per Tube Every 12 hours 02/17/20 1006 02/20/20 0924   02/15/20 1000  vancomycin (VANCOCIN) IVPB 1000 mg/200 mL premix  Status:  Discontinued        1,000 mg 200 mL/hr over 60 Minutes Intravenous Every 12 hours 02/14/20 2226 02/16/20 0742   02/14/20 2245  piperacillin-tazobactam (ZOSYN) IVPB 3.375 g  Status:  Discontinued        3.375 g 12.5 mL/hr over 240 Minutes Intravenous Every 8 hours 02/14/20 2226 02/16/20 0742   02/14/20 2245  vancomycin (VANCOREADY) IVPB 1500 mg/300 mL        1,500 mg 150 mL/hr over 120 Minutes Intravenous  Once 02/14/20 2226 02/15/20 0739   02/01/20 2200  amoxicillin-clavulanate (AUGMENTIN) 250-62.5 MG/5ML suspension 500 mg        500 mg Per Tube Every 8 hours 02/01/20 1343 02/06/20 1333   01/31/20 1015  levofloxacin (LEVAQUIN) 25 MG/ML solution 500 mg  Status:  Discontinued        500 mg Per Tube Daily 01/31/20 0919 02/01/20 1343   01/31/20 1000  cefTRIAXone (ROCEPHIN) 1 g in sodium chloride 0.9 % 100 mL IVPB  Status:  Discontinued        1 g 200 mL/hr over 30 Minutes  Intravenous Every 24 hours 01/31/20 0905 01/31/20 0919   01/19/20 2200  linezolid (ZYVOX) tablet 600 mg        600 mg Per Tube Every 12 hours 01/19/20 1534 01/25/20 2019   01/19/20 1100  levofloxacin (LEVAQUIN) tablet 500 mg  Status:  Discontinued        500 mg Per Tube Daily 01/19/20 1004 01/19/20 1534   12/19/19 0930  Darunavir-Cobicisctat-Emtricitabine-Tenofovir Alafenamide (SYMTUZA) 800-150-200-10 MG TABS 1 tablet        1 tablet Oral Daily with breakfast 12/19/19 0840     12/13/19 1000  bictegravir-emtricitabine-tenofovir AF (BIKTARVY) 50-200-25 MG per tablet 1 tablet  Status:  Discontinued        1 tablet Oral Daily 12/12/19 1413 12/19/19 0840   11/17/19 1548  ceFAZolin (ANCEF) 2-4 GM/100ML-% IVPB       Note to Pharmacy: Domenick Bookbinder   : cabinet override      11/17/19 1548 11/18/19 0359   11/16/19 1515  ceFAZolin (ANCEF) IVPB 2g/100 mL premix        2 g 200 mL/hr over 30 Minutes Intravenous To Radiology 11/16/19 1511 11/17/19 1625   11/15/19 0900  sulfamethoxazole-trimethoprim (BACTRIM DS) 800-160 MG per tablet 1 tablet        1 tablet Per Tube Once per day on Mon Wed Fri 11/13/19 1906     11/13/19 1615  dolutegravir (TIVICAY) tablet 50 mg  Status:  Discontinued        50 mg Oral Daily 11/13/19 1521 12/12/19 1413   11/13/19 1615  emtricitabine-tenofovir AF (DESCOVY) 200-25 MG per tablet 1 tablet  Status:  Discontinued        1 tablet Per Tube Daily 11/13/19 1521 12/12/19 1413   11/13/19 1530  sulfamethoxazole-trimethoprim (BACTRIM DS) 800-160 MG per tablet 1 tablet  Status:  Discontinued        1 tablet Oral Once per day on Mon Wed Fri 11/13/19 1441 11/13/19 1906   11/13/19 1500  bictegravir-emtricitabine-tenofovir AF (BIKTARVY) 50-200-25 MG per tablet 1 tablet  Status:  Discontinued        1 tablet Oral Daily 11/13/19 1403 11/13/19 1521   11/10/19 1000  erythromycin 250 mg in sodium chloride 0.9 % 100 mL IVPB  250 mg 100 mL/hr over 60 Minutes Intravenous Every 8 hours  11/10/19 0907 11/11/19 2000   11/07/19 1200  ceFEPIme (MAXIPIME) 2 g in sodium chloride 0.9 % 100 mL IVPB        2 g 200 mL/hr over 30 Minutes Intravenous Every 8 hours 11/07/19 1013 11/13/19 2127   11/03/19 1200  cefTRIAXone (ROCEPHIN) 2 g in sodium chloride 0.9 % 100 mL IVPB        2 g 200 mL/hr over 30 Minutes Intravenous Every 24 hours 11/03/19 0922 11/05/19 1427   11/02/19 0800  vancomycin (VANCOREADY) IVPB 750 mg/150 mL  Status:  Discontinued        750 mg 150 mL/hr over 60 Minutes Intravenous Every 12 hours 11/01/19 1935 11/02/19 1105   11/02/19 0600  piperacillin-tazobactam (ZOSYN) IVPB 3.375 g  Status:  Discontinued        3.375 g 12.5 mL/hr over 240 Minutes Intravenous Every 8 hours 11/02/19 0311 11/03/19 0920   11/01/19 2200  ceFEPIme (MAXIPIME) 2 g in sodium chloride 0.9 % 100 mL IVPB  Status:  Discontinued        2 g 200 mL/hr over 30 Minutes Intravenous Every 12 hours 11/01/19 2143 11/02/19 0311   11/01/19 1915  vancomycin (VANCOREADY) IVPB 1500 mg/300 mL        1,500 mg 150 mL/hr over 120 Minutes Intravenous  Once 11/01/19 1909 11/01/19 2147   11/01/19 1845  cefTRIAXone (ROCEPHIN) 1 g in sodium chloride 0.9 % 100 mL IVPB        1 g 200 mL/hr over 30 Minutes Intravenous  Once 11/01/19 1842 11/01/19 2051   11/01/19 1845  azithromycin (ZITHROMAX) 500 mg in sodium chloride 0.9 % 250 mL IVPB        500 mg 250 mL/hr over 60 Minutes Intravenous  Once 11/01/19 1842 11/01/19 2051       Time spent: 20 minutes    Erin Hearing ANP  [facility]  7 am-330 pm/M-F for direct patient care and secure chat Please refer to [place] for contact information 03/07/2020, 8:04 AM  LOS: 127 days

## 2020-03-07 NOTE — Plan of Care (Signed)
  Problem: Clinical Measurements: Goal: Will remain free from infection Outcome: Progressing Goal: Diagnostic test results will improve Outcome: Progressing Goal: Respiratory complications will improve Outcome: Progressing Goal: Cardiovascular complication will be avoided Outcome: Progressing   Problem: Activity: Goal: Risk for activity intolerance will decrease Outcome: Progressing   Problem: Nutrition: Goal: Adequate nutrition will be maintained Outcome: Progressing   Problem: Coping: Goal: Level of anxiety will decrease Outcome: Progressing   Problem: Elimination: Goal: Will not experience complications related to bowel motility Outcome: Progressing Goal: Will not experience complications related to urinary retention Outcome: Progressing   Problem: Pain Managment: Goal: General experience of comfort will improve Outcome: Progressing   Problem: Safety: Goal: Ability to remain free from injury will improve Outcome: Progressing   Problem: Skin Integrity: Goal: Risk for impaired skin integrity will decrease Outcome: Progressing   Problem: Education: Goal: 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 Outcome: Progressing   Problem: Activity: Goal: Ability to tolerate increased activity will improve Outcome: Progressing   Problem: Health Behavior/Discharge Planning: Goal: 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 Outcome: Progressing   Problem: Respiratory: Goal: Patent airway maintenance will improve Outcome: Progressing   Problem: Role Relationship: Goal: Ability to communicate will improve Outcome: Progressing   Problem: Education: Goal: Knowledge of the prescribed therapeutic regimen Outcome: Progressing Goal: Knowledge of disease or condition will improve Outcome: Progressing   Problem: Clinical Measurements: Goal: Neurologic status will improve Outcome: Progressing   Problem: Tissue Perfusion: Goal: Ability to maintain  intracranial pressure will improve Outcome: Progressing   Problem: Respiratory: Goal: Will regain and/or maintain adequate ventilation Outcome: Progressing   Problem: Skin Integrity: Goal: Risk for impaired skin integrity will decrease Outcome: Progressing Goal: Demonstration of wound healing without infection will improve Outcome: Progressing   Problem: Psychosocial: Goal: Ability to verbalize positive feelings about self will improve Outcome: Progressing Goal: Ability to participate in self-care as condition permits will improve Outcome: Progressing Goal: Ability to identify appropriate support needs will improve Outcome: Progressing   Problem: Health Behavior/Discharge Planning: Goal: Ability to manage health-related needs will improve Outcome: Progressing   Problem: Nutritional: Goal: Risk of aspiration will decrease Outcome: Progressing Goal: Dietary intake will improve Outcome: Progressing   Problem: Communication: Goal: Ability to communicate needs accurately will improve Outcome: Progressing   

## 2020-03-08 LAB — GLUCOSE, CAPILLARY
Glucose-Capillary: 102 mg/dL — ABNORMAL HIGH (ref 70–99)
Glucose-Capillary: 133 mg/dL — ABNORMAL HIGH (ref 70–99)
Glucose-Capillary: 179 mg/dL — ABNORMAL HIGH (ref 70–99)
Glucose-Capillary: 233 mg/dL — ABNORMAL HIGH (ref 70–99)
Glucose-Capillary: 74 mg/dL (ref 70–99)
Glucose-Capillary: 88 mg/dL (ref 70–99)

## 2020-03-08 NOTE — TOC Progression Note (Signed)
Transition of Care [facility]) - Progression Note    Patient Details  Name: Ryan Orozco MRN: [id number] Date of Birth: 05/08/1965  [facility]) CM/SW Contact  Janae Bridgeman, RN Phone Number: 03/08/2020, 3:34 PM  Clinical Narrative:    Case management spoke with [name], CM and she will review the patient's clinicals for possible placement at [place], [place] at [facility] facility.   Expected Discharge Plan: Skilled Nursing Facility Barriers to Discharge: Continued Medical Work up,SNF Pending bed offer  Expected Discharge Plan and Services Expected Discharge Plan: Skilled Nursing Facility   Discharge Planning Services: CM Consult Post Acute Care Choice: Nursing Home Living arrangements for the past 2 months: Single Family Home                                       Social Determinants of Health (SDOH) Interventions    Readmission Risk Interventions Readmission Risk Prevention Plan 02/06/2020  Transportation Screening Complete  PCP or Specialist Appt within 3-5 Days Complete  HRI or Home Care Consult Complete  Social Work Consult for Recovery Care Planning/Counseling Complete  Palliative Care Screening Complete  Medication Review [employer]) Complete  Some recent data might be hidden

## 2020-03-08 NOTE — Progress Notes (Signed)
[facility] PROGRESS NOTE  Ryan Orozco FIE:[id number] DOB: Apr 01, 1965 DOA: 11/01/2019 PCP: Default, Provider, MD    11/16   Status: Remains inpatient appropriate because:Altered mental status, Unsafe d/c plan and Inpatient level of care appropriate due to severity of illness. Patient newly diagnosed with anoxic brain injury  Dispo: The patient is from: Home              Anticipated d/c is to: SNF              Anticipated d/c date is: > 3 days              Patient currently is medically stable to d/c.  Barriers to discharge: No SNF bed offers.  Needs trach capable facility.  Still awaiting approval of Medicaid and disability.  No bed offers.   Code Status: DNR after d/w palliative team on 1/27 Family Communication: 1/24 dtr [name] updated DVT prophylaxis: Subcutaneous heparin Vaccination status: Will order first dose Pfizer COVID-vaccine on 1/10; next week we will order pneumococcal and influenza vaccinations  Foley catheter:  No  HPI: 55 year old male with HIV, chronic alcohol abuse who was presented to the emergency department after being found unresponsive outside a convinient store.  EMS found him pulseless in PEA, unknown downtime, successfully resuscitated and brought to the emergency department.  UDS positive for THC and benzodiazepines.  CT head unremarkable.  Failed extubation trials due to severe anoxic brain injury and had tracheostomy placed.  PEG placed on 10/15.  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  This has led to significant pain as well.  [name] was consulted and recommendations/input/orders highly appreciated.  She may also benefit from Botox injections to both hips to aid in treatment of spasticity.   Significant Hospital Events   9/29 Admit post PEA arrest. UDS positive for THC, benzo's. ETOH 177 10/05 EEG ongoing. Versed restarted overnight due to agitation.  Nicardipine increased.  10/06 Developed tachypnea with WUA, no follow commands off sedation  10/07 Vomiting, TF held / restarted with recurrent vomiting 10/16 tracheostomy collar for 9 hours 10/18->10/19 off vent all night  10/20> TC 11/15 > downsized to #4 Shiley flex CFS  Subjective: Awake, no acute distress.  Smiling.  Became less restless when head of bed elevated significantly.  Objective: Vitals:   03/08/20 0526 03/08/20 0809  BP: 125/72   Pulse: 87 85  Resp: 18 16  Temp: 98.7 F (37.1 C)   SpO2: 97% 99%    Intake/Output Summary (Last 24 hours) at 03/08/2020 0819 Last data filed at 03/08/2020 0047 Gross per 24 hour  Intake 490 ml  Output --  Net 490 ml   Filed Weights   03/04/20 0500 03/05/20 0500 03/06/20 0500  Weight: 73 kg 71.5 kg 72 kg    Exam: General: Acute, no acute distress, calm Pulmonary: Lungs clear with minimal tracheal secretions, trach collar for humidification only- FiO2 21%  Cardiac: Normal S1-S2, pulse regular, extremities warm to touch Abdomen:  PEG tube for feedings and medications.  Bowel sounds auscultated and abdomen nontender.  LBM 2/2 Neurological: Severe hypertonicity/spasticity bilateral lower extremities involving the hip flexors and hamstrings; primarily keeps bilateral hips flexed -continues to have pain w/ attempts to extend either leg especially the right leg.  Limited use of upper extremities with action primarily initiated from the shoulder girth. Psychiatric: Awake, nonverbal except for simple monosyllabic responses such as hey so unable to assess orientation  Assessment/Plan: Acute problems:  Anoxic brain injury 2/2 PEA arrest/pain syndrome secondary to hypertonicity/spastic tetraplegia:  -Presumed 2/2 combination alcohol and BZD overdose with UDS positive for THC and BZDs -Significant brain injury with underlying spastic tetraplegia long-term SNF placement recommended-continue PT/OT/SLP -Continue baclofen, gabapentin, scheduled  oxycodone and Zanaflex -as of 1/21 patient seemed to be having increasing pain so have increased frequency of scheduled oxycodone to every 6 hours -Appreciate assistance of [name]; 1/6 Botox injection into the quadriceps and rectus femoris as well as the biceps femoris and hamstrings, semimembranosus and semitendinosus.  Unable to inject iliopsoas without EMG guidance.  Unfortunately no significant improvement and spasticity hypertonicity. -Continue bilateral upper extremity WHO resting splints along with PROM per nursing staff every 4 hours -Continue low-dose Seroquel for brain injury sequela -Continue KREG rotational bed  -Have asked therapy to see if there is any possibility of getting patient up to recliner chair and securing with belt restraint as well as wedging pillows.  We are hopeful if this can be accomplished with a lift we can rule patient out to the nurses station for improved socialization.  Goals of care:  -Palliative care was involved during this hospitalization.    Initially goals of care were discussed and patient remains a full code.  Palliative care team reconsulted on 1/27 and after an extensive discussion daughter does realize that patient quite debilitated and would not survive a resuscitation therefore patient made a DNR  Chronic respiratory failure with inability to maintain patent airway requiring chronic tracheostomy tube/Recent Corynebacterium tracheobronchitis (resolved) -Continue #4 cuffless trach in place-trach changed out on 1/10 RT -Given altered mentation and inability to manage secretions PCCM states patient is not a candidate for elective decannulation  Fever/peri-PEG drainage positive for corynebacterium and rare Pseudomonas -PEG site with some drainage but no erythema, induration or pain -Suspect may have colonization noting recently had corynebacterium tracheobronchitis treated -continue bacitracin ointment  HIV:  -CD4 count of 124 October 2021 (had been  as high as 250 Dec 2020)-as of today up to 259; viral load obtained on 1/15 was <20 -Dc'd Tivicay and Descovy infavor of Biktarvy to for eventual dc to SNF; per my discussion with infectious disease physician [name] on 12/23 preferred regimen would be Descovy and Tivicay-she will contact the HIV pharmacist to attempt to obtain discount cards to make this a more affordable option.  Once this has been confirmed will transition back to Descovy and Tivicay -CD4 count on 12/23 up to 259 -ID recommends continuing prophylactic Bactrim an additional 3 months  Dysphagia 2/2 anoxic brain injury -Continue tube feeding per PEG -SLP attempted to introduce oral substances on 12/27 but patient did not accept and was pushing out with his tongue.  Given his acute UTI he may have been nauseated and this may have been reason for him not wishing to eat.  Constipation -No documented bowel movement since 1/13 -We will continue scheduled Colace, fiber supplements per tube -Add daily MiraLAX -Given one-time dose milk of magnesia on 1/20-no documented bowel movement on flowsheet -Suspect requirement for narcotics and multiple muscle relaxers contributing to decreased GI motility and constipation symptoms  Protein calorie malnutrition nutrition Status: Nutrition Problem: Increased nutrient needs Etiology: acute illness Signs/Symptoms: estimated needs Interventions: Tube feeding bolus doses of Osmolite, Juven twice daily, Prosource TF 45 mL 3 times daily Estimated body mass index is 25.62 kg/m as calculated from the following:   Height as of this encounter: 5' 6"  (1.676 m).   Weight as of this encounter: 72 kg.  Multiple decubiti not POA Wound / Incision (Open or Dehisced) 11/17/19 Puncture Abdomen Left;Anterior;Upper G-tube insertion site  (Active)  Date First Assessed/Time First Assessed: 11/17/19 1646   Wound Type: Puncture  Location: Abdomen  Location Orientation: Left;Anterior;Upper  Wound Description  (Comments): G-tube insertion site   Present on Admission: No    Assessments 11/17/2019  4:47 PM 03/07/2020  8:54 PM  Dressing Type Gauze (Comment);Tape dressing Gauze (Comment)  Dressing Changed New Changed  Dressing Status Clean;Dry;Intact Clean;Dry  Dressing Change Frequency PRN Daily  Site / Wound Assessment Clean;Dry;Pink Clean;Dry  Peri-wound Assessment Intact --  Margins -- Unattached edges (unapproximated)  Closure None None  Drainage Amount None Scant  Drainage Description -- Serosanguineous  Non-staged Wound Description -- Not applicable  Treatment Cleansed --     No Linked orders to display      Other problems: Hypertension/grade 1 diastolic dysfunction:  -Continue amlodipine  -Echocardiogram this admission with preserved LVEF with evidence of grade   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   -Continue Keppra; levetiracetam level 35.9 on 11/12 -no further seizure activity since transitioned out of ICU setting therefore will discontinue seizure precaution  SIRS/Fever 2/2 recurrent enterococcus UTI -Patient developed mild elevation in temperature overnight to 1/13 associated with slight increase in respiratory secretions and congested sounding cough/abnormal pulmonary exam as well as tachycardia./14 respiratory status much improved and no further fevers. -Respiratory viral panel neg for COVID, influenza and RSV.   -UA slightly abnormal/cx c/w enterococcus UTI- started on nitrofurantoin (prior UTI treated w/ augmentin) has completed 3 days of treatment -X-ray with bilateral opacities concerning for early pneumonia but CT of the chest without pneumonia, broad-spectrum antibiotics dc'd 1/14 -Because of dehydration increased frequency of free water from 200 cc every 4 hours to every 2 hours on 1/13- stopped on 1/15  Inguinal hernias -Evaluated by general surgery this admission. Documented as chronically incarcerated. Patient has been clinically stable and  tolerating tube feedings and having bowel movements so unless patient obstructed would not be considered a candidate for repair. Even if obstructed consulting surgeon was not sure he would be a candidate for hernia repair regardless  Constipation -No documented bowel movement since 1/13 -We will continue scheduled Colace, fiber supplements per tube -Add daily MiraLAX -Given one-time dose milk of magnesia on 1/20-no documented bowel movement on flowsheet -Suspect requirement for narcotics and multiple muscle relaxers contributing to decreased GI motility and constipation symptoms  Goals of care:  -Palliative care was involved during this hospitalization.   -Goals of care were discussed. remains full code.  -Palliative care recommended outpatient follow-up with palliative providers. -Reconsult Palliative medicine 1/27   Data Reviewed: Basic Metabolic Panel: No results for input(s): NA, K, CL, CO2, GLUCOSE, BUN, CREATININE, CALCIUM, MG, PHOS in the last 168 hours. Liver Function Tests: No results for input(s): AST, ALT, ALKPHOS, BILITOT, PROT, ALBUMIN in the last 168 hours. No results for input(s): LIPASE, AMYLASE in the last 168 hours. No results for input(s): AMMONIA in the last 168 hours. CBC: No results for input(s): WBC, NEUTROABS, HGB, HCT, MCV, PLT in the last 168 hours. Cardiac Enzymes: No results for input(s): CKTOTAL, CKMB, CKMBINDEX, TROPONINI in the last 168 hours. BNP (last 3 results) Recent Labs    02/15/20 0433  BNP 3.9    ProBNP (last 3 results) No results for input(s): PROBNP in the last 8760 hours.  CBG: Recent Labs  Lab 03/07/20 1624 03/07/20 1953 03/07/20 2355 03/08/20 0328 03/08/20 0740  GLUCAP 103* 141*  148* 88 102*    Recent Results (from the past 240 hour(s))  Aerobic Culture (superficial specimen)     Status: None   Collection Time: 02/28/20  2:54 PM   Specimen: Wound  Result Value Ref Range Status   Specimen Description WOUND PEG SITE   Final   Special Requests NONE  Final   Gram Stain   Final    ABUNDANT WBC PRESENT,BOTH PMN AND MONONUCLEAR FEW SQUAMOUS EPITHELIAL CELLS PRESENT ABUNDANT GRAM POSITIVE RODS    Culture   Final    RARE PSEUDOMONAS AERUGINOSA ABUNDANT DIPHTHEROIDS(CORYNEBACTERIUM SPECIES) Standardized susceptibility testing for this organism is not available. Performed at [facility], [place] [address] [place], [place] [address]    Report Status 03/01/2020 FINAL  Final   Organism ID, Bacteria PSEUDOMONAS AERUGINOSA  Final      Susceptibility   Pseudomonas aeruginosa - MIC*    CEFTAZIDIME 4 SENSITIVE Sensitive     CIPROFLOXACIN 2 INTERMEDIATE Intermediate     GENTAMICIN <=1 SENSITIVE Sensitive     IMIPENEM 2 SENSITIVE Sensitive     * RARE PSEUDOMONAS AERUGINOSA     Studies: No results found.  Scheduled Meds: . acetaminophen (TYLENOL) oral liquid 160 mg/5 mL  650 mg Per Tube Q6H  . amLODipine  10 mg Per Tube Daily  . bacitracin   Topical BID  . baclofen  30 mg Per Tube TID  . chlorhexidine gluconate (MEDLINE KIT)  15 mL Mouth Rinse BID  . Darunavir-Cobicisctat-Emtricitabine-Tenofovir Alafenamide  1 tablet Oral Q breakfast  . famotidine  10.4 mg Per Tube BID  . feeding supplement (OSMOLITE 1.5 CAL)  237 mL Per Tube Q24H  . feeding supplement (OSMOLITE 1.5 CAL)  474 mL Per Tube TID  . feeding supplement (PROSource TF)  45 mL Per Tube TID  . fiber  1 packet Per Tube BID  . folic acid  1 mg Per Tube Daily  . free water  200 mL Per Tube Q4H  . gabapentin  200 mg Per Tube Q8H  . heparin injection (subcutaneous)  5,000 Units Subcutaneous Q8H  . levETIRAcetam  1,500 mg Per Tube BID  . magic mouthwash w/lidocaine  5 mL Oral QID  . Muscle Rub   Topical QID  . oxyCODONE  5 mg Per Tube Q6H  . polyethylene glycol  17 g Per Tube QHS  . QUEtiapine  12.5 mg Per Tube QHS  . scopolamine  1 patch Transdermal Q72H  . sulfamethoxazole-trimethoprim  1 tablet Per Tube Once per day on Mon Wed Fri  .  thiamine  100 mg Per Tube Daily  . tiZANidine  6 mg Per Tube Q6H   Continuous Infusions:   Active Problems:   Cardiac arrest [facility])   Community acquired pneumonia of left upper lobe of lung   Anoxic brain injury ([place])   Alcohol abuse   Acute respiratory failure with hypoxia (H[facility])   Vomiting   Pressure injury of skin   Small bowel obstruction (HCC)   Palliative care encounter   Bowel obstruction (HCC)   Chronic respiratory failure (HCC)   Diastolic dysfunction   Tracheostomy dependent (HCC)   Dysphagia   Protein-calorie malnutrition (HCC)   Seizures (HCC)   Bilateral recurrent inguinal hernia   Muscle spasticity   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HCC)   Tracheobronchitis   Sequela of Corynebacterium infection   Abnormal urinalysis   Urinary tract infection due to Enterococcus   Fever   Consultants:  PCCM  Palliative medicine  General surgery  Neurology  Interventional radiology  Procedures:  EEG  Echocardiogram  Tracheostomy  Antibiotics: Anti-infectives (From admission, onward)   Start     Dose/Rate Route Frequency Ordered Stop   02/17/20 1100  nitrofurantoin (FURADANTIN) 25 MG/5ML suspension 100 mg        100 mg Per Tube Every 12 hours 02/17/20 1006 02/20/20 0924   02/15/20 1000  vancomycin (VANCOCIN) IVPB 1000 mg/200 mL premix  Status:  Discontinued        1,000 mg 200 mL/hr over 60 Minutes Intravenous Every 12 hours 02/14/20 2226 02/16/20 0742   02/14/20 2245  piperacillin-tazobactam (ZOSYN) IVPB 3.375 g  Status:  Discontinued        3.375 g 12.5 mL/hr over 240 Minutes Intravenous Every 8 hours 02/14/20 2226 02/16/20 0742   02/14/20 2245  vancomycin (VANCOREADY) IVPB 1500 mg/300 mL        1,500 mg 150 mL/hr over 120 Minutes Intravenous  Once 02/14/20 2226 02/15/20 0739   02/01/20 2200  amoxicillin-clavulanate (AUGMENTIN) 250-62.5 MG/5ML suspension 500 mg        500 mg Per Tube Every 8 hours 02/01/20 1343 02/06/20 1333   01/31/20 1015   levofloxacin (LEVAQUIN) 25 MG/ML solution 500 mg  Status:  Discontinued        500 mg Per Tube Daily 01/31/20 0919 02/01/20 1343   01/31/20 1000  cefTRIAXone (ROCEPHIN) 1 g in sodium chloride 0.9 % 100 mL IVPB  Status:  Discontinued        1 g 200 mL/hr over 30 Minutes Intravenous Every 24 hours 01/31/20 0905 01/31/20 0919   01/19/20 2200  linezolid (ZYVOX) tablet 600 mg        600 mg Per Tube Every 12 hours 01/19/20 1534 01/25/20 2019   01/19/20 1100  levofloxacin (LEVAQUIN) tablet 500 mg  Status:  Discontinued        500 mg Per Tube Daily 01/19/20 1004 01/19/20 1534   12/19/19 0930  Darunavir-Cobicisctat-Emtricitabine-Tenofovir Alafenamide (SYMTUZA) 800-150-200-10 MG TABS 1 tablet        1 tablet Oral Daily with breakfast 12/19/19 0840     12/13/19 1000  bictegravir-emtricitabine-tenofovir AF (BIKTARVY) 50-200-25 MG per tablet 1 tablet  Status:  Discontinued        1 tablet Oral Daily 12/12/19 1413 12/19/19 0840   11/17/19 1548  ceFAZolin (ANCEF) 2-4 GM/100ML-% IVPB       Note to Pharmacy: Domenick Bookbinder   : cabinet override      11/17/19 1548 11/18/19 0359   11/16/19 1515  ceFAZolin (ANCEF) IVPB 2g/100 mL premix        2 g 200 mL/hr over 30 Minutes Intravenous To Radiology 11/16/19 1511 11/17/19 1625   11/15/19 0900  sulfamethoxazole-trimethoprim (BACTRIM DS) 800-160 MG per tablet 1 tablet        1 tablet Per Tube Once per day on Mon Wed Fri 11/13/19 1906     11/13/19 1615  dolutegravir (TIVICAY) tablet 50 mg  Status:  Discontinued        50 mg Oral Daily 11/13/19 1521 12/12/19 1413   11/13/19 1615  emtricitabine-tenofovir AF (DESCOVY) 200-25 MG per tablet 1 tablet  Status:  Discontinued        1 tablet Per Tube Daily 11/13/19 1521 12/12/19 1413   11/13/19 1530  sulfamethoxazole-trimethoprim (BACTRIM DS) 800-160 MG per tablet 1 tablet  Status:  Discontinued        1 tablet Oral Once per day on Mon Wed Fri 11/13/19 1441 11/13/19  1906   11/13/19 1500  bictegravir-emtricitabine-tenofovir  AF (BIKTARVY) 50-200-25 MG per tablet 1 tablet  Status:  Discontinued        1 tablet Oral Daily 11/13/19 1403 11/13/19 1521   11/10/19 1000  erythromycin 250 mg in sodium chloride 0.9 % 100 mL IVPB        250 mg 100 mL/hr over 60 Minutes Intravenous Every 8 hours 11/10/19 0907 11/11/19 2000   11/07/19 1200  ceFEPIme (MAXIPIME) 2 g in sodium chloride 0.9 % 100 mL IVPB        2 g 200 mL/hr over 30 Minutes Intravenous Every 8 hours 11/07/19 1013 11/13/19 2127   11/03/19 1200  cefTRIAXone (ROCEPHIN) 2 g in sodium chloride 0.9 % 100 mL IVPB        2 g 200 mL/hr over 30 Minutes Intravenous Every 24 hours 11/03/19 0922 11/05/19 1427   11/02/19 0800  vancomycin (VANCOREADY) IVPB 750 mg/150 mL  Status:  Discontinued        750 mg 150 mL/hr over 60 Minutes Intravenous Every 12 hours 11/01/19 1935 11/02/19 1105   11/02/19 0600  piperacillin-tazobactam (ZOSYN) IVPB 3.375 g  Status:  Discontinued        3.375 g 12.5 mL/hr over 240 Minutes Intravenous Every 8 hours 11/02/19 0311 11/03/19 0920   11/01/19 2200  ceFEPIme (MAXIPIME) 2 g in sodium chloride 0.9 % 100 mL IVPB  Status:  Discontinued        2 g 200 mL/hr over 30 Minutes Intravenous Every 12 hours 11/01/19 2143 11/02/19 0311   11/01/19 1915  vancomycin (VANCOREADY) IVPB 1500 mg/300 mL        1,500 mg 150 mL/hr over 120 Minutes Intravenous  Once 11/01/19 1909 11/01/19 2147   11/01/19 1845  cefTRIAXone (ROCEPHIN) 1 g in sodium chloride 0.9 % 100 mL IVPB        1 g 200 mL/hr over 30 Minutes Intravenous  Once 11/01/19 1842 11/01/19 2051   11/01/19 1845  azithromycin (ZITHROMAX) 500 mg in sodium chloride 0.9 % 250 mL IVPB        500 mg 250 mL/hr over 60 Minutes Intravenous  Once 11/01/19 1842 11/01/19 2051       Time spent: 20 minutes    Erin Hearing ANP  [facility]  7 am-330 pm/M-F for direct patient care and secure chat Please refer to [place] for contact information 03/08/2020, 8:19 AM  LOS: 128 days

## 2020-03-09 LAB — GLUCOSE, CAPILLARY
Glucose-Capillary: 101 mg/dL — ABNORMAL HIGH (ref 70–99)
Glucose-Capillary: 123 mg/dL — ABNORMAL HIGH (ref 70–99)
Glucose-Capillary: 121 mg/dL — ABNORMAL HIGH (ref 70–99)
Glucose-Capillary: 107 mg/dL — ABNORMAL HIGH (ref 70–99)
Glucose-Capillary: 175 mg/dL — ABNORMAL HIGH (ref 70–99)

## 2020-03-09 NOTE — Progress Notes (Signed)
PROGRESS NOTE  Brief Narrative: Ryan Orozco is a 55 y.o. male with a history of HIV and alcohol abuse who was found pulseless outside a convenience store, ROSC achieved by EMS and brought to the ED 11/01/2019. THC, alcohol, and benzodiazepine levels were positive. He was admitted to the ICU and has shown evidence of severe anoxic brain injury, spastic quadriplegia, and continued tracheostomy dependence.   Subjective: No recent fevers or new signs/symptoms reported by pt or staff. Pt appears stable, answering with grunts but no meaningful expression. Does smile.    Objective: BP (!) 145/74 (BP Location: Left Leg)   Pulse 85   Temp 98.3 F (36.8 C) (Oral)   Resp 18   Ht 5\' 6"  (1.676 m)   Wt 72 kg   SpO2 98%   BMI 25.62 kg/m   Gen: Severely contracted, chronically ill-appearing male Pulm: Clear, nonlabored thru trach CV: RRR, no edema GI: No discharge or erythema from G tube site. No tenderness to palpation.  Neuro: Alert, tracks, spastic quadriplegia unchanged.  Assessment & Plan: 55yo male with sequelae of OOH cardiac arrest, notably anoxic brain injury remains hemodynamically and medically stable for discharge to SNF.   It appears discharge is made difficult by the need to secure a payor (awaiting approval for medicaid) and a facility able to care for tracheostomy. PCCM confirms that the patient's altered mentation and inability to manage secretions precludes elective decannulation. This condition, overall, is likely irreversible. Palliative has been involved with family and pt is DNR without plans to convert to comfort measures.  Will continue medications as ordered. G tube site does not clinically appear infected, pt has a multitude of alternative causes of fever (namely atelectasis, chronically incarcerated hernias, risk for aspiration, etc.) and has had no vital sign instability to suggest need for initiating antimicrobial therapy at this time.   We deeply appreciate the  assistance from PM&R for spasticity/pain, ID for recommendations regarding ART and 3 months of continued bactrim ppx, and PCCM for tracheostomy care.   Time spent: 15 minutes.  55yo, MD Pager on amion 03/09/2020, 11:27 AM

## 2020-03-10 LAB — GLUCOSE, CAPILLARY
Glucose-Capillary: 119 mg/dL — ABNORMAL HIGH (ref 70–99)
Glucose-Capillary: 165 mg/dL — ABNORMAL HIGH (ref 70–99)
Glucose-Capillary: 79 mg/dL (ref 70–99)
Glucose-Capillary: 89 mg/dL (ref 70–99)
Glucose-Capillary: 93 mg/dL (ref 70–99)
Glucose-Capillary: 93 mg/dL (ref 70–99)
Glucose-Capillary: 93 mg/dL (ref 70–99)

## 2020-03-10 MED ORDER — LORAZEPAM 2 MG/ML PO CONC
1.0000 mg | Freq: Four times a day (QID) | ORAL | Status: AC | PRN
  Administered 2020-03-10 – 2020-04-02 (×2): 1 mg
  Filled 2020-03-10 (×2): qty 1

## 2020-03-10 MED ORDER — LORAZEPAM 2 MG/ML IJ SOLN
1.0000 mg | Freq: Four times a day (QID) | INTRAMUSCULAR | Status: DC | PRN
  Filled 2020-03-10: qty 1

## 2020-03-10 NOTE — Progress Notes (Signed)
PROGRESS NOTE  Brief Narrative: Ryan Orozco is a 55 y.o. male with a history of HIV and alcohol abuse who was found pulseless outside a convenience store, ROSC achieved by EMS and brought to the ED 11/01/2019. THC, alcohol, and benzodiazepine levels were positive. He was admitted to the ICU and has shown evidence of severe anoxic brain injury, spastic quadriplegia, and continued tracheostomy dependence.   Subjective: No overnight events, has remained afebrile. Pt unable to meaningfully communicate.  Objective: BP 132/84 (BP Location: Left Leg)   Pulse 92   Temp 99.5 F (37.5 C) (Oral)   Resp 18   Ht 5\' 6"  (1.676 m)   Wt 72 kg   SpO2 100%   BMI 25.62 kg/m   Gen: Chronically ill-appearing male in no distress Pulm: Nonlabored thru trach, clear. CV: RRR without edema GI: G tube site c/d/i, soft belly, nontender  Neuro: Alert, grunting, tracks with eyes past midline bilaterally, doesn't follow commands. Severely contracted LE's stable.   Assessment & Plan: 55yo male with sequelae of OOH cardiac arrest, notably anoxic brain injury remains hemodynamically and medically stable for discharge to SNF.   No changes to plan/medications today.   It appears discharge is made difficult by the need to secure a payor (awaiting approval for medicaid) and a facility able to care for tracheostomy. PCCM confirms that the patient's altered mentation and inability to manage secretions precludes elective decannulation. This condition, overall, is likely irreversible. Palliative has been involved with family and pt is DNR without plans to convert to comfort measures.  We deeply appreciate the assistance from PM&R for spasticity/pain, ID for recommendations regarding ART and 3 months of continued bactrim ppx, and PCCM for tracheostomy care.   Time spent: 15 minutes.  55yo, MD Pager on amion 03/10/2020, 8:30 AM

## 2020-03-11 LAB — GLUCOSE, CAPILLARY
Glucose-Capillary: 95 mg/dL (ref 70–99)
Glucose-Capillary: 100 mg/dL — ABNORMAL HIGH (ref 70–99)
Glucose-Capillary: 182 mg/dL — ABNORMAL HIGH (ref 70–99)
Glucose-Capillary: 94 mg/dL (ref 70–99)
Glucose-Capillary: 137 mg/dL — ABNORMAL HIGH (ref 70–99)
Glucose-Capillary: 158 mg/dL — ABNORMAL HIGH (ref 70–99)

## 2020-03-11 MED ORDER — COVID-19 MRNA VAC-TRIS(PFIZER) 30 MCG/0.3ML IM SUSP
0.3000 mL | Freq: Once | INTRAMUSCULAR | Status: AC
  Administered 2020-03-11: 0.3 mL via INTRAMUSCULAR
  Filled 2020-03-11: qty 0.3

## 2020-03-11 NOTE — Plan of Care (Signed)
Patient remains free from falls. Safety precautions maintained. 

## 2020-03-11 NOTE — Progress Notes (Addendum)
[facility] PROGRESS NOTE  [place] CLE:[id number] DOB: [birth year] DOA: 11/01/2019 PCP: Default, Provider, MD    11/16   Status: Remains inpatient appropriate because:Altered mental status, Unsafe d/c plan and Inpatient level of care appropriate due to severity of illness. Patient newly diagnosed with anoxic brain injury  Dispo: The patient is from: Home              Anticipated d/c is to: SNF              Anticipated d/c date is: > 3 days              Patient currently is medically stable to d/c.  Barriers to discharge: No SNF bed offers.  Needs trach capable facility.  Still awaiting approval of Medicaid and disability.  No bed offers.   Code Status: DNR after d/w palliative team on 1/27 Family Communication: 2/7 dtr Aquilla Solian will be in [place] on 2/21 DVT prophylaxis: Subcutaneous heparin Vaccination status: Will order first dose Pfizer COVID-vaccine on 1/10; next week we will order pneumococcal and influenza vaccinations-2nd dose ordered 277  Foley catheter:  No  HPI: 55 year old male with HIV, chronic alcohol abuse who was presented to the emergency department after being found unresponsive outside a convinient store.  EMS found him pulseless in PEA, unknown downtime, successfully resuscitated and brought to the emergency department.  UDS positive for THC and benzodiazepines.  CT head unremarkable.  Failed extubation trials due to severe anoxic brain injury and had tracheostomy placed.  PEG placed on 10/15.  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  This has led to significant pain as well.  [name] was consulted and recommendations/input/orders highly appreciated.  She may also benefit from Botox injections to both hips to aid in treatment of spasticity.   Significant Hospital Events   9/29 Admit post PEA arrest. UDS positive for THC, benzo's. ETOH 177 10/05 EEG  ongoing. Versed restarted overnight due to agitation. Nicardipine increased.  10/06 Developed tachypnea with WUA, no follow commands off sedation  10/07 Vomiting, TF held / restarted with recurrent vomiting 10/16 tracheostomy collar for 9 hours 10/18->10/19 off vent all night  10/20> TC 11/15 > downsized to #4 Shiley flex CFS  Subjective: Awake and alert.  Again with unintelligible and or nonsensical speech although does attempt to engage when you speak to him.  Objective: Vitals:   03/11/20 0515 03/11/20 0722  BP: (!) 150/70   Pulse: 82 89  Resp: 20 (!) 21  Temp: 98.8 F (37.1 C)   SpO2: 96% 96%    Intake/Output Summary (Last 24 hours) at 03/11/2020 0752 Last data filed at 03/11/2020 0413 Gross per 24 hour  Intake -  Output 1120 ml  Net -1120 ml   Filed Weights   03/04/20 0500 03/05/20 0500 03/06/20 0500  Weight: 73 kg 71.5 kg 72 kg    Exam: General: Awake, alert, calm and in no acute distress Pulmonary: FiO2 21% trach tube in place with humidification via trach collar lungs clear to auscultation.  No excessive secretions Cardiac: Heart sounds S1-S2, extremities warm to touch with adequate capillary refill, pulse regular to auscultation Abdomen:  PEG tube for tube feedings.  Abdomen soft nontender nondistended.  LBM 2/2 Neurological: Severe hypertonicity/spasticity bilateral lower extremities involving the hip flexors and hamstrings; primarily keeps bilateral hips flexed -continues to have pain w/ attempts to extend either leg especially the right leg.  Nonpurposeful use of upper extremities.  Has mittens in place Psychiatric: Awake, unintelligible speech so unclear if patient actually oriented.  Very pleasant.  Assessment/Plan: Acute problems: Anoxic brain injury 2/2 PEA arrest/pain syndrome secondary to hypertonicity/spastic tetraplegia:  -Presumed 2/2 combination alcohol and BZD overdose with UDS positive for THC and BZDs -Significant brain injury/spastic tetraplegia  -needs long-term SNF -Continue baclofen, gabapentin, scheduled oxycodone and Zanaflex -Appreciate assistance of [name]; 1/6 Botox injection into the quadriceps and rectus femoris as well as the biceps femoris and hamstrings, semimembranosus and semitendinosus.  Unable to inject iliopsoas without EMG guidance.  Unfortunately no significant improvement and spasticity hypertonicity. -Continue bilateral UE WHO resting splints along w/ PROM per nursing staff every 4 hours -Continue low-dose Seroquel for brain injury sequela -Continue KREG rotational bed  -Have asked therapy to see if there is any possibility of getting patient up to recliner chair and securing with belt restraint as well as wedging pillows.  We are hopeful if this can be accomplished with a lift we can rule patient out to the nurses station for improved socialization.  Goals of care:  -Palliative care was involved during this hospitalization.    Initially goals of care were discussed and patient remains a full code.  Palliative care team reconsulted on 1/27 and after an extensive discussion daughter does realize that patient quite debilitated and would not survive a resuscitation therefore patient made a DNR  Chronic respiratory failure with inability to maintain patent airway requiring chronic tracheostomy tube/Recent Corynebacterium tracheobronchitis (resolved) -Continue #4 cuffless trach in place-trach changed out on 1/10 RT -Given altered mentation and inability to manage secretions PCCM states patient is not a candidate for elective decannulation  Fever/peri-PEG drainage positive for corynebacterium and rare Pseudomonas -PEG site with some drainage but no erythema, induration or pain -Suspect may have colonization noting recently had corynebacterium tracheobronchitis treated -continue bacitracin ointment  HIV:  -CD4 count of 124 October 2021 (had been as high as 250 Dec 2020)-as of today up to 259; viral load obtained on  1/15 was <20 -Dc'd Tivicay and Descovy infavor of Biktarvy to for eventual dc to SNF; per my discussion with infectious disease physician [name] on 12/23 preferred regimen would be Descovy and Tivicay-she will contact the HIV pharmacist to attempt to obtain discount cards to make this a more affordable option.  Once this has been confirmed will transition back to Descovy and Tivicay -CD4 count on 12/23 up to 259 -ID recommends continuing prophylactic Bactrim an additional 3 months  Dysphagia 2/2 anoxic brain injury -Continue tube feeding per PEG -SLP attempted to introduce oral substances on 12/27 but patient did not accept and was pushing out with his tongue.  Given his acute UTI he may have been nauseated and this may have been reason for him not wishing to eat.  Constipation -No documented bowel movement since 1/13 -We will continue scheduled Colace, fiber supplements per tube -Add daily MiraLAX -Given one-time dose milk of magnesia on 1/20-no documented bowel movement on flowsheet -Suspect requirement for narcotics and multiple muscle relaxers contributing to decreased GI motility and constipation symptoms  Protein calorie malnutrition nutrition Status: Nutrition Problem: Increased nutrient needs Etiology: acute illness Signs/Symptoms: estimated needs Interventions: Tube feeding bolus doses of Osmolite, Juven twice daily, Prosource TF 45 mL 3 times daily Estimated body mass index is 25.62 kg/m as calculated from the following:   Height as of this encounter: 5' 6"  (1.676 m).   Weight as of this encounter: 72 kg.   Multiple decubiti not POA Wound /  Incision (Open or Dehisced) 11/17/19 Puncture Abdomen Left;Anterior;Upper G-tube insertion site  (Active)  Date First Assessed/Time First Assessed: 11/17/19 1646   Wound Type: Puncture  Location: Abdomen  Location Orientation: Left;Anterior;Upper  Wound Description (Comments): G-tube insertion site   Present on Admission: No     Assessments 11/17/2019  4:47 PM 03/10/2020  8:30 PM  Dressing Type Gauze (Comment);Tape dressing Gauze (Comment)  Dressing Changed New -  Dressing Status Clean;Dry;Intact Clean;Dry;Intact  Dressing Change Frequency PRN Daily  Site / Wound Assessment Clean;Dry;Pink Clean;Dry  Peri-wound Assessment Intact -  Closure None -  Drainage Amount None -  Treatment Cleansed -     No Linked orders to display      Other problems: Hypertension/grade 1 diastolic dysfunction:  -Continue amlodipine  -Echocardiogram this admission with preserved LVEF with evidence of grade   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   -Continue Keppra; levetiracetam level 35.9 on 11/12 -no further seizure activity since transitioned out of ICU setting therefore will discontinue seizure precaution  SIRS/Fever 2/2 recurrent enterococcus UTI -Patient developed mild elevation in temperature overnight to 1/13 associated with slight increase in respiratory secretions and congested sounding cough/abnormal pulmonary exam as well as tachycardia./14 respiratory status much improved and no further fevers. -Respiratory viral panel neg for COVID, influenza and RSV.   -UA slightly abnormal/cx c/w enterococcus UTI- started on nitrofurantoin (prior UTI treated w/ augmentin) has completed 3 days of treatment -X-ray with bilateral opacities concerning for early pneumonia but CT of the chest without pneumonia, broad-spectrum antibiotics dc'd 1/14 -Because of dehydration increased frequency of free water from 200 cc every 4 hours to every 2 hours on 1/13- stopped on 1/15  Inguinal hernias -Evaluated by general surgery this admission. Documented as chronically incarcerated. Patient has been clinically stable and tolerating tube feedings and having bowel movements so unless patient obstructed would not be considered a candidate for repair. Even if obstructed consulting surgeon was not sure he would be a candidate for  hernia repair regardless  Constipation -No documented bowel movement since 1/13 -We will continue scheduled Colace, fiber supplements per tube -Add daily MiraLAX -Given one-time dose milk of magnesia on 1/20-no documented bowel movement on flowsheet -Suspect requirement for narcotics and multiple muscle relaxers contributing to decreased GI motility and constipation symptoms  Goals of care:  -Palliative care was involved during this hospitalization.   -Goals of care were discussed. remains full code.  -Palliative care recommended outpatient follow-up with palliative providers. -Reconsult Palliative medicine 1/27   Data Reviewed: Basic Metabolic Panel: No results for input(s): NA, K, CL, CO2, GLUCOSE, BUN, CREATININE, CALCIUM, MG, PHOS in the last 168 hours. Liver Function Tests: No results for input(s): AST, ALT, ALKPHOS, BILITOT, PROT, ALBUMIN in the last 168 hours. No results for input(s): LIPASE, AMYLASE in the last 168 hours. No results for input(s): AMMONIA in the last 168 hours. CBC: No results for input(s): WBC, NEUTROABS, HGB, HCT, MCV, PLT in the last 168 hours. Cardiac Enzymes: No results for input(s): CKTOTAL, CKMB, CKMBINDEX, TROPONINI in the last 168 hours. BNP (last 3 results) Recent Labs    02/15/20 0433  BNP 3.9    ProBNP (last 3 results) No results for input(s): PROBNP in the last 8760 hours.  CBG: Recent Labs  Lab 03/10/20 1602 03/10/20 2043 03/10/20 2301 03/11/20 0316 03/11/20 0740  GLUCAP 93 165* 119* 95 100*    No results found for this or any previous visit (from the past 240 hour(s)).   Studies: No  results found.  Scheduled Meds: . acetaminophen (TYLENOL) oral liquid 160 mg/5 mL  650 mg Per Tube Q6H  . amLODipine  10 mg Per Tube Daily  . bacitracin   Topical BID  . baclofen  30 mg Per Tube TID  . chlorhexidine gluconate (MEDLINE KIT)  15 mL Mouth Rinse BID  . Darunavir-Cobicisctat-Emtricitabine-Tenofovir Alafenamide  1 tablet Oral Q  breakfast  . famotidine  10.4 mg Per Tube BID  . feeding supplement (OSMOLITE 1.5 CAL)  237 mL Per Tube Q24H  . feeding supplement (OSMOLITE 1.5 CAL)  474 mL Per Tube TID  . feeding supplement (PROSource TF)  45 mL Per Tube TID  . fiber  1 packet Per Tube BID  . folic acid  1 mg Per Tube Daily  . free water  200 mL Per Tube Q4H  . gabapentin  200 mg Per Tube Q8H  . heparin injection (subcutaneous)  5,000 Units Subcutaneous Q8H  . levETIRAcetam  1,500 mg Per Tube BID  . magic mouthwash w/lidocaine  5 mL Oral QID  . Muscle Rub   Topical QID  . oxyCODONE  5 mg Per Tube Q6H  . polyethylene glycol  17 g Per Tube QHS  . QUEtiapine  12.5 mg Per Tube QHS  . scopolamine  1 patch Transdermal Q72H  . sulfamethoxazole-trimethoprim  1 tablet Per Tube Once per day on Mon Wed Fri  . thiamine  100 mg Per Tube Daily  . tiZANidine  6 mg Per Tube Q6H   Continuous Infusions:   Active Problems:   Cardiac arrest [facility])   Community acquired pneumonia of left upper lobe of lung   Anoxic brain injury ([place])   Alcohol abuse   Acute respiratory failure with hypoxia (HCC)   Vomiting   Pressure injury of skin   Small bowel obstruction (HCC)   Palliative care encounter   Bowel obstruction (HCC)   Chronic respiratory failure (HCC)   Diastolic dysfunction   Tracheostomy dependent (HCC)   Dysphagia   Protein-calorie malnutrition (HCC)   Seizures (HCC)   Bilateral recurrent inguinal hernia   Muscle spasticity   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HCC)   Tracheobronchitis   Sequela of Corynebacterium infection   Abnormal urinalysis   Urinary tract infection due to Enterococcus   Fever   Consultants:  PCCM  Palliative medicine  General surgery  Neurology  Interventional radiology  Procedures:  EEG  Echocardiogram  Tracheostomy  Antibiotics: Anti-infectives (From admission, onward)   Start     Dose/Rate Route Frequency Ordered Stop   02/17/20 1100  nitrofurantoin  (FURADANTIN) 25 MG/5ML suspension 100 mg        100 mg Per Tube Every 12 hours 02/17/20 1006 02/20/20 0924   02/15/20 1000  vancomycin (VANCOCIN) IVPB 1000 mg/200 mL premix  Status:  Discontinued        1,000 mg 200 mL/hr over 60 Minutes Intravenous Every 12 hours 02/14/20 2226 02/16/20 0742   02/14/20 2245  piperacillin-tazobactam (ZOSYN) IVPB 3.375 g  Status:  Discontinued        3.375 g 12.5 mL/hr over 240 Minutes Intravenous Every 8 hours 02/14/20 2226 02/16/20 0742   02/14/20 2245  vancomycin (VANCOREADY) IVPB 1500 mg/300 mL        1,500 mg 150 mL/hr over 120 Minutes Intravenous  Once 02/14/20 2226 02/15/20 0739   02/01/20 2200  amoxicillin-clavulanate (AUGMENTIN) 250-62.5 MG/5ML suspension 500 mg        500 mg Per Tube Every 8 hours 02/01/20  1343 02/06/20 1333   01/31/20 1015  levofloxacin (LEVAQUIN) 25 MG/ML solution 500 mg  Status:  Discontinued        500 mg Per Tube Daily 01/31/20 0919 02/01/20 1343   01/31/20 1000  cefTRIAXone (ROCEPHIN) 1 g in sodium chloride 0.9 % 100 mL IVPB  Status:  Discontinued        1 g 200 mL/hr over 30 Minutes Intravenous Every 24 hours 01/31/20 0905 01/31/20 0919   01/19/20 2200  linezolid (ZYVOX) tablet 600 mg        600 mg Per Tube Every 12 hours 01/19/20 1534 01/25/20 2019   01/19/20 1100  levofloxacin (LEVAQUIN) tablet 500 mg  Status:  Discontinued        500 mg Per Tube Daily 01/19/20 1004 01/19/20 1534   12/19/19 0930  Darunavir-Cobicisctat-Emtricitabine-Tenofovir Alafenamide (SYMTUZA) 800-150-200-10 MG TABS 1 tablet        1 tablet Oral Daily with breakfast 12/19/19 0840     12/13/19 1000  bictegravir-emtricitabine-tenofovir AF (BIKTARVY) 50-200-25 MG per tablet 1 tablet  Status:  Discontinued        1 tablet Oral Daily 12/12/19 1413 12/19/19 0840   11/17/19 1548  ceFAZolin (ANCEF) 2-4 GM/100ML-% IVPB       Note to Pharmacy: Domenick Bookbinder   : cabinet override      11/17/19 1548 11/18/19 0359   11/16/19 1515  ceFAZolin (ANCEF) IVPB 2g/100 mL  premix        2 g 200 mL/hr over 30 Minutes Intravenous To Radiology 11/16/19 1511 11/17/19 1625   11/15/19 0900  sulfamethoxazole-trimethoprim (BACTRIM DS) 800-160 MG per tablet 1 tablet        1 tablet Per Tube Once per day on Mon Wed Fri 11/13/19 1906     11/13/19 1615  dolutegravir (TIVICAY) tablet 50 mg  Status:  Discontinued        50 mg Oral Daily 11/13/19 1521 12/12/19 1413   11/13/19 1615  emtricitabine-tenofovir AF (DESCOVY) 200-25 MG per tablet 1 tablet  Status:  Discontinued        1 tablet Per Tube Daily 11/13/19 1521 12/12/19 1413   11/13/19 1530  sulfamethoxazole-trimethoprim (BACTRIM DS) 800-160 MG per tablet 1 tablet  Status:  Discontinued        1 tablet Oral Once per day on Mon Wed Fri 11/13/19 1441 11/13/19 1906   11/13/19 1500  bictegravir-emtricitabine-tenofovir AF (BIKTARVY) 50-200-25 MG per tablet 1 tablet  Status:  Discontinued        1 tablet Oral Daily 11/13/19 1403 11/13/19 1521   11/10/19 1000  erythromycin 250 mg in sodium chloride 0.9 % 100 mL IVPB        250 mg 100 mL/hr over 60 Minutes Intravenous Every 8 hours 11/10/19 0907 11/11/19 2000   11/07/19 1200  ceFEPIme (MAXIPIME) 2 g in sodium chloride 0.9 % 100 mL IVPB        2 g 200 mL/hr over 30 Minutes Intravenous Every 8 hours 11/07/19 1013 11/13/19 2127   11/03/19 1200  cefTRIAXone (ROCEPHIN) 2 g in sodium chloride 0.9 % 100 mL IVPB        2 g 200 mL/hr over 30 Minutes Intravenous Every 24 hours 11/03/19 0922 11/05/19 1427   11/02/19 0800  vancomycin (VANCOREADY) IVPB 750 mg/150 mL  Status:  Discontinued        750 mg 150 mL/hr over 60 Minutes Intravenous Every 12 hours 11/01/19 1935 11/02/19 1105   11/02/19 0600  piperacillin-tazobactam (ZOSYN) IVPB 3.375  g  Status:  Discontinued        3.375 g 12.5 mL/hr over 240 Minutes Intravenous Every 8 hours 11/02/19 0311 11/03/19 0920   11/01/19 2200  ceFEPIme (MAXIPIME) 2 g in sodium chloride 0.9 % 100 mL IVPB  Status:  Discontinued        2 g 200 mL/hr over 30  Minutes Intravenous Every 12 hours 11/01/19 2143 11/02/19 0311   11/01/19 1915  vancomycin (VANCOREADY) IVPB 1500 mg/300 mL        1,500 mg 150 mL/hr over 120 Minutes Intravenous  Once 11/01/19 1909 11/01/19 2147   11/01/19 1845  cefTRIAXone (ROCEPHIN) 1 g in sodium chloride 0.9 % 100 mL IVPB        1 g 200 mL/hr over 30 Minutes Intravenous  Once 11/01/19 1842 11/01/19 2051   11/01/19 1845  azithromycin (ZITHROMAX) 500 mg in sodium chloride 0.9 % 250 mL IVPB        500 mg 250 mL/hr over 60 Minutes Intravenous  Once 11/01/19 1842 11/01/19 2051       Time spent: 20 minutes    Erin Hearing ANP  [facility]  7 am-330 pm/M-F for direct patient care and secure chat Please refer to [place] for contact information 03/11/2020, 7:52 AM  LOS: 131 days

## 2020-03-11 NOTE — Procedures (Signed)
Tracheostomy Change Note  Patient Details:   Name: Ryan Orozco DOB: 18-Apr-1965 MRN: [id number]    Airway Documentation:     Evaluation  O2 sats: stable throughout Complications: No apparent complications Patient did tolerate procedure well. Bilateral Breath Sounds: Diminished,Clear   Pt's trach was changed with another #4 shiley CFS flex shliey. Additional RT at bedside for assistance. Color change was noted post trach insertion. No complications were noted with trach change. RT will monitor.     Merlene Laughter 03/11/2020, 11:56 AM

## 2020-03-11 NOTE — Progress Notes (Signed)
Nutrition Follow-up  DOCUMENTATION CODES:   Not applicable  INTERVENTION:  Continue bolus TF regimen via PEG: -Provide 2 cartons (499m) Osmolite 1.5 TID @ 0800, 1200, 1700 -Provide 1 carton (2313m Osmolite 1.5 once daily at bedtime @ 2100 -Flush with 6024mree water before and after each tube feeding bolus -Provide 60m14mosource TF TID -Provide Nutrisource fiber BID -Additional free water flushes currently 200ml2m  Tube feeding regimen provides 2605 kcals, 137 grams protein,1267ml 79m water (2947ml w78mflushes)  NUTRITION DIAGNOSIS:   Increased nutrient needs related to acute illness as evidenced by estimated needs. -- ongoing  GOAL:   Patient will meet greater than or equal to 90% of their needs -- met with TF  MONITOR:   Labs,Weight trends,TF tolerance,Skin,I & O's  REASON FOR ASSESSMENT:   Ventilator,Consult Enteral/tube feeding initiation and management  ASSESSMENT:   Pt admitted with anoxic brain injury 2/2 PEA arrest/pain syndrome 2/2 hypertonicity/spastic tetraplegia (presumed to be 2/2 combination of EtOH and BZD overdose with UDS positive for THC and BZDs). PMH includes HIV and EtOH use.  09/29 - intubated 10/07- vomiting, TF held, laterrestarted with recurrent vomiting 10/12-R/L inguinal hernia, partial SBO  10/13- trach 10/15-PEG 10/16 - tolerated TC for 9 hours 10/18->10/19 - off vent overnight 10/20- TC 11/15 - trach downsized to #4 Shiley flex CFS 12/06 - trach changed (same #4 Shiley flex CFS) 1/11- trach changed (same #4 Shiley flex CFS)  Discharge continues to be difficult due to no SNF bed offers; pt needs trach capable facility. Still awaiting approval of Medicaid and disability. Note pt's daughter is supposed to come visit pt on 2/21.   Note pt's trach changed with another #4 shiley flex CFS today.   Pt aware/alert, though speech remains unintelligible. Pt NPO, continues to tolerate bolus TF via PEG per RN. No abdominal  distention noted. Current TF regimen is as follows: 2 cartons (474ml) O60mite 1.5 TID, 1 carton (237ml) Os7mte 1.5 once daily, 60ml free58mer flush before and after each tube feeding bolus + additional FWF of 200ml Q4H, 64m Prosou24mTF TID and Nutrisource fiber BID. Continue with current regimen.   UOP: 1120ml x 24 ho24m Medications: pepcid, folic acid, miralax, thiamine Labs reviewed, none since 02/18/20. CBGs 95-100-182  Diet Order:   Diet Order    None      EDUCATION NEEDS:   Not appropriate for education at this time  Skin:  Skin Assessment: Skin Integrity Issues: Skin Integrity Issues:: Other (Comment) Stage II: N/A Stage III: N/A Other: puncture abdomen (PEG insertion site)  Last BM:  2/6 type 7  Height:   Ht Readings from Last 1 Encounters:  11/01/19 5' 6"  (1.676 m)    Weight:   Wt Readings from Last 1 Encounters:  03/06/20 72 kg    BMI:  Body mass index is 25.62 kg/m.  Estimated Nutritional Needs:   Kcal:  2400-2600  Protein:  120-135 grams  Fluid:  >2L/d  Bralee Feldt AveretLarkin Ina RD pager number and weekend/on-call pager number located in [email or web address]

## 2020-03-11 NOTE — TOC Progression Note (Signed)
Transition of Care (TOC) - Progression Note    Patient Details  Name: Ryan Orozco MRN: [id number] Date of Birth: 04/17/1965  Transition of Care (TOC) CM/SW Contact   R Stubbldfield, RN Phone Number: 03/11/2020, 1:16 PM  Clinical Narrative:    Case management met with the patient at the bedside regarding transitions of care.  The patient needs long term SNF placement.  The daughter, [name], called and said she spoke with the Medicaid MSW and they are assisting the daughter in processing the patient's pending disability determination.  The daughter is aware that the patient currently does not have any bed offers currently but TOC team continues to reach out to area SNFs for placement.  The patient's daughter is planning to visit the patient on 03/25/2020 at this time.  I called and left a message with Tammy Blakely, CM to inquire if the [facility] facility in [place], [place] could offer a bed to the patient.  No SNF facilities in this area are able to offer a bed at this time due to care needed in relation to the patient's trach care.  CM will continue to follow the patient for SNF admission.   Expected Discharge Plan: Skilled Nursing Facility Barriers to Discharge: Continued Medical Work up,SNF Pending bed offer  Expected Discharge Plan and Services Expected Discharge Plan: Skilled Nursing Facility   Discharge Planning Services: CM Consult Post Acute Care Choice: Nursing Home Living arrangements for the past 2 months: Single Family Home                                       Social Determinants of Health (SDOH) Interventions    Readmission Risk Interventions Readmission Risk Prevention Plan 02/06/2020  Transportation Screening Complete  PCP or Specialist Appt within 3-5 Days Complete  HRI or Home Care Consult Complete  Social Work Consult for Recovery Care Planning/Counseling Complete  Palliative Care Screening Complete  Medication Review (RN Care Manager)  Complete  Some recent data might be hidden   

## 2020-03-12 ENCOUNTER — Inpatient Hospital Stay (HOSPITAL_COMMUNITY): Payer: Medicaid Other

## 2020-03-12 LAB — COMPREHENSIVE METABOLIC PANEL
ALT: 18 U/L (ref 0–44)
AST: 25 U/L (ref 15–41)
Albumin: 3.5 g/dL (ref 3.5–5.0)
Alkaline Phosphatase: 93 U/L (ref 38–126)
Anion gap: 12 (ref 5–15)
BUN: 16 mg/dL (ref 6–20)
CO2: 26 mmol/L (ref 22–32)
Calcium: 10.9 mg/dL — ABNORMAL HIGH (ref 8.9–10.3)
Chloride: 106 mmol/L (ref 98–111)
Creatinine, Ser: 0.7 mg/dL (ref 0.61–1.24)
GFR, Estimated: >60 mL/min (ref >60)
Glucose, Bld: 96 mg/dL (ref 70–99)
Potassium: 3.9 mmol/L (ref 3.5–5.1)
Sodium: 144 mmol/L (ref 135–145)
Total Bilirubin: 0.5 mg/dL (ref 0.3–1.2)
Total Protein: 8 g/dL (ref 6.5–8.1)

## 2020-03-12 LAB — GLUCOSE, CAPILLARY
Glucose-Capillary: 103 mg/dL — ABNORMAL HIGH (ref 70–99)
Glucose-Capillary: 100 mg/dL — ABNORMAL HIGH (ref 70–99)
Glucose-Capillary: 159 mg/dL — ABNORMAL HIGH (ref 70–99)
Glucose-Capillary: 114 mg/dL — ABNORMAL HIGH (ref 70–99)
Glucose-Capillary: 169 mg/dL — ABNORMAL HIGH (ref 70–99)
Glucose-Capillary: 97 mg/dL (ref 70–99)

## 2020-03-12 LAB — CBC WITH DIFFERENTIAL/PLATELET
Abs Immature Granulocytes: 0.01 10*3/uL (ref 0.00–0.07)
Basophils Absolute: 0 10*3/uL (ref 0.0–0.1)
Basophils Relative: 0 %
Eosinophils Absolute: 0.1 10*3/uL (ref 0.0–0.5)
Eosinophils Relative: 2 %
HCT: 42.9 % (ref 39.0–52.0)
Hemoglobin: 14.2 g/dL (ref 13.0–17.0)
Immature Granulocytes: 0 %
Lymphocytes Relative: 15 %
Lymphs Abs: 0.7 10*3/uL (ref 0.7–4.0)
MCH: 31.1 pg (ref 26.0–34.0)
MCHC: 33.1 g/dL (ref 30.0–36.0)
MCV: 94.1 fL (ref 80.0–100.0)
Monocytes Absolute: 0.5 10*3/uL (ref 0.1–1.0)
Monocytes Relative: 11 %
Neutro Abs: 3.3 10*3/uL (ref 1.7–7.7)
Neutrophils Relative %: 72 %
Platelets: 179 10*3/uL (ref 150–400)
RBC: 4.56 MIL/uL (ref 4.22–5.81)
RDW: 12.8 % (ref 11.5–15.5)
WBC: 4.6 10*3/uL (ref 4.0–10.5)
nRBC: 0 % (ref 0.0–0.2)

## 2020-03-12 LAB — URINALYSIS, ROUTINE W REFLEX MICROSCOPIC
Bilirubin Urine: NEGATIVE
Glucose, UA: NEGATIVE mg/dL
Hgb urine dipstick: NEGATIVE
Ketones, ur: NEGATIVE mg/dL
Nitrite: NEGATIVE
Protein, ur: NEGATIVE mg/dL
Specific Gravity, Urine: 1.014 (ref 1.005–1.030)
pH: 6 (ref 5.0–8.0)

## 2020-03-12 LAB — LACTATE DEHYDROGENASE: LDH: 178 U/L (ref 98–192)

## 2020-03-12 NOTE — TOC Progression Note (Addendum)
Transition of Care [facility]) - Progression Note    Patient Details  Name: Page Lancon MRN: [id number] Date of Birth: 17-Jan-1966  Transition of [facility]) CM/SW Contact  Janae Bridgeman, RN Phone Number: 03/12/2020, 7:51 AM  Clinical Narrative:    Case management spoke with [name], CM with [facility] facilities and [place] facility to consider patient for admission.  No beds are available for admission this week, but CM will follow up with Isabelle Course, CM admission director at the facility next week - [phone].  Clinicals were sent to the facility for review.  Patient has pending Medicaid and disability at this time but [facility] facility will need LOG from [facility] to cover room and board and medications if patient is accepted for admission.  Grandville Silos, CSW director was notified and updated on the potential need for financial coverage with LOG.   Expected Discharge Plan: Skilled Nursing Facility Barriers to Discharge: Continued Medical Work up,SNF Pending bed offer  Expected Discharge Plan and Services Expected Discharge Plan: Skilled Nursing Facility   Discharge Planning Services: CM Consult Post Acute Care Choice: Nursing Home Living arrangements for the past 2 months: Single Family Home                                       Social Determinants of Health (SDOH) Interventions    Readmission Risk Interventions Readmission Risk Prevention Plan 02/06/2020  Transportation Screening Complete  PCP or Specialist Appt within 3-5 Days Complete  HRI or Home Care Consult Complete  Social Work Consult for Recovery Care Planning/Counseling Complete  Palliative Care Screening Complete  Medication Review Oceanographer) Complete  Some recent data might be hidden

## 2020-03-12 NOTE — Progress Notes (Signed)
[facility] PROGRESS NOTE  Hughey [place] XNT:[id number] DOB: 05/17/1965 DOA: 11/01/2019 PCP: Default, Provider, MD    11/16   Status: Remains inpatient appropriate because:Altered mental status, Unsafe d/c plan and Inpatient level of care appropriate due to severity of illness. Patient newly diagnosed with anoxic brain injury  Dispo: The patient is from: Home              Anticipated d/c is to: SNF              Anticipated d/c date is: > 3 days              Patient currently is medically stable to d/c.  Barriers to discharge: No SNF bed offers.  Needs trach capable facility.  Still awaiting approval of Medicaid and disability.  No bed offers.   Code Status: DNR after d/w palliative team on 1/27 Family Communication: 2/7 dtr Aquilla Solian will be in [place] on 2/21 DVT prophylaxis: Subcutaneous heparin Vaccination status: Will order first dose Pfizer COVID-vaccine on 1/10; next week we will order pneumococcal and influenza vaccinations-2nd dose ordered 277  Foley catheter:  No  HPI: 55 year old male with HIV, chronic alcohol abuse who was presented to the emergency department after being found unresponsive outside a convinient store.  EMS found him pulseless in PEA, unknown downtime, successfully resuscitated and brought to the emergency department.  UDS positive for THC and benzodiazepines.  CT head unremarkable.  Failed extubation trials due to severe anoxic brain injury and had tracheostomy placed.  PEG placed on 10/15.  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  This has led to significant pain as well.  [name] was consulted and recommendations/input/orders highly appreciated.  She may also benefit from Botox injections to both hips to aid in treatment of spasticity.   Significant Hospital Events   9/29 Admit post PEA arrest. UDS positive for THC, benzo's. ETOH 177 10/05 EEG  ongoing. Versed restarted overnight due to agitation. Nicardipine increased.  10/06 Developed tachypnea with WUA, no follow commands off sedation  10/07 Vomiting, TF held / restarted with recurrent vomiting 10/16 tracheostomy collar for 9 hours 10/18->10/19 off vent all night  10/20> TC 11/15 > downsized to #4 Shiley flex CFS  Subjective: Patient awake.  Does not appear to be in acute distress.  Continues with incomprehensible speech which sounds like hey, ow, yea  Objective: Vitals:   03/12/20 0513 03/12/20 0727  BP: 128/85   Pulse:  87  Resp: 18 16  Temp: 98.1 F (36.7 C)   SpO2: 96% 95%    Intake/Output Summary (Last 24 hours) at 03/12/2020 0746 Last data filed at 03/12/2020 0359 Gross per 24 hour  Intake --  Output 2000 ml  Net -2000 ml   Filed Weights   03/04/20 0500 03/05/20 0500 03/06/20 0500  Weight: 73 kg 71.5 kg 72 kg    Exam: General: Alert no acute distress Pulmonary: Bilateral lung sounds clear anteriorly, FiO2 21% trach tube in place with humidification without any significant secretions noted Cardiac: Heart sounds, pulse regular, extremities warm to touch and no peripheral edema Abdomen:  PEG tube insertion site unremarkable -tube feeding infusing-LBM 2/7 Neurological: Severe hypertonicity/spasticity bilateral lower extremities involving the hip flexors and hamstrings; primarily keeps bilateral hips flexed -continues to have pain w/ attempts to extend either leg especially the right leg.  Nonpurposeful use of upper extremities.   Psychiatric: Awake, given incomprehensible speech unable to assess orientation.  Pleasant  affect.  Assessment/Plan: Acute problems: Anoxic brain injury 2/2 PEA arrest/pain syndrome secondary to hypertonicity/spastic tetraplegia:  -Presumed 2/2 combination alcohol and BZD overdose with UDS positive for THC and BZDs -Significant brain injury/spastic tetraplegia -needs long-term SNF -Continue baclofen, gabapentin, scheduled oxycodone  and Zanaflex -Appreciate assistance of [name]; 1/6 Botox injection into the quadriceps and rectus femoris as well as the biceps femoris and hamstrings, semimembranosus and semitendinosus.  Unable to inject iliopsoas without EMG guidance.  Unfortunately no significant improvement and spasticity hypertonicity. -Continue bilateral UE WHO resting splints along w/ PROM per nursing staff every 4 hours -Continue low-dose Seroquel for brain injury sequela -Continue KREG rotational bed -Patient has met all PT goals i.e. he has not been able to make any significant progress therefore PT has signed off.  I discussed with PT today regarding best modality to get patient down to chair.  Stated previously had been using a lift and securing patient with a belt.  She agreed with my plan to do the same as well as wedging him with pillows and if necessary partially reclining chair to prevent patient from falling out.  She agrees that getting patient out of bed to chair and rolling to nurses station for socialization would be in the best interest of the patient.  Low-grade fever -2/8: Solitary T-max 100.5 overnight.  Likely related to atelectasis.  Obtain chest x-ray today that was unremarkable -As a precaution we will go ahead and obtain urinalysis and culture and blood cultures; electrolytes unremarkable, LDH normal at 178, no leukocytosis  Goals of care:  -Palliative care team reconsulted on 1/27 and after an extensive discussion daughter does realize that patient quite debilitated and would not survive a resuscitation therefore patient made a DNR  Chronic respiratory failure with inability to maintain patent airway requiring chronic tracheostomy tube/Recent Corynebacterium tracheobronchitis (resolved) -Continue #4 cuffless trach in place-trach changed out on 1/10 RT -Given altered mentation and inability to manage secretions PCCM states patient is not a candidate for elective decannulation  Fever/peri-PEG  drainage positive for corynebacterium and rare Pseudomonas -PEG site with some drainage but no erythema, induration or pain -Suspect may have colonization noting recently had corynebacterium tracheobronchitis treated -continue bacitracin ointment  HIV:  -CD4 count of 124 October 2021 (had been as high as 250 Dec 2020)-as of today up to 259; viral load obtained on 1/15 was <20 -Dc'd Tivicay and Descovy infavor of Biktarvy to for eventual dc to SNF; per my discussion with infectious disease physician [name] on 12/23 preferred regimen would be Descovy and Tivicay-she will contact the HIV pharmacist to attempt to obtain discount cards to make this a more affordable option.  Once this has been confirmed will transition back to Descovy and Tivicay -CD4 count on 12/23 up to 259 -ID recommends continuing prophylactic Bactrim an additional 3 months  Dysphagia 2/2 anoxic brain injury -Continue tube feeding per PEG -Given altered mentation patient not a candidate for further speech evaluations and will be permanently dependent on enteral tube feedings  Constipation -We will continue scheduled Colace, fiber supplements per tube as well as daily MiraLAX -Suspect requirement for narcotics and multiple muscle relaxers contributing to decreased GI motility and constipation symptoms  Protein calorie malnutrition nutrition Status: Nutrition Problem: Increased nutrient needs Etiology: acute illness Signs/Symptoms: estimated needs Interventions: Tube feeding bolus doses of Osmolite, Juven twice daily, Prosource TF 45 mL 3 times daily Estimated body mass index is 25.62 kg/m as calculated from the following:   Height as of this  encounter: 5' 6"  (1.676 m).   Weight as of this encounter: 72 kg.   Multiple decubiti not POA Wound / Incision (Open or Dehisced) 11/17/19 Puncture Abdomen Left;Anterior;Upper G-tube insertion site  (Active)  Date First Assessed/Time First Assessed: 11/17/19 1646   Wound Type:  Puncture  Location: Abdomen  Location Orientation: Left;Anterior;Upper  Wound Description (Comments): G-tube insertion site   Present on Admission: No    Assessments 11/17/2019  4:47 PM 03/11/2020  8:00 PM  Dressing Type Gauze (Comment);Tape dressing Gauze (Comment)  Dressing Changed New --  Dressing Status Clean;Dry;Intact Intact;Clean;Dry  Dressing Change Frequency PRN Daily  Site / Wound Assessment Clean;Dry;Pink Clean;Dry  Peri-wound Assessment Intact --  Margins -- Unattached edges (unapproximated)  Closure None None  Drainage Amount None --  Non-staged Wound Description -- Not applicable  Treatment Cleansed --     No Linked orders to display      Other problems: Hypertension/grade 1 diastolic dysfunction:  -Continue amlodipine  -Echocardiogram this admission with preserved LVEF with evidence of grade   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   -Continue Keppra; levetiracetam level 35.9 on 11/12 -no further seizure activity since transitioned out of ICU setting therefore will discontinue seizure precaution  SIRS/Fever 2/2 recurrent enterococcus UTI -Patient developed mild elevation in temperature overnight to 1/13 associated with slight increase in respiratory secretions and congested sounding cough/abnormal pulmonary exam as well as tachycardia./14 respiratory status much improved and no further fevers. -Respiratory viral panel neg for COVID, influenza and RSV.   -UA slightly abnormal/cx c/w enterococcus UTI- started on nitrofurantoin (prior UTI treated w/ augmentin) has completed 3 days of treatment -X-ray with bilateral opacities concerning for early pneumonia but CT of the chest without pneumonia, broad-spectrum antibiotics dc'd 1/14 -Because of dehydration increased frequency of free water from 200 cc every 4 hours to every 2 hours on 1/13- stopped on 1/15  Inguinal hernias -Evaluated by general surgery this admission. Documented as chronically  incarcerated. Patient has been clinically stable and tolerating tube feedings and having bowel movements so unless patient obstructed would not be considered a candidate for repair. Even if obstructed consulting surgeon was not sure he would be a candidate for hernia repair regardless  Constipation -No documented bowel movement since 1/13 -We will continue scheduled Colace, fiber supplements per tube -Add daily MiraLAX -Given one-time dose milk of magnesia on 1/20-no documented bowel movement on flowsheet -Suspect requirement for narcotics and multiple muscle relaxers contributing to decreased GI motility and constipation symptoms  Goals of care:  -Palliative care was involved during this hospitalization.   -Goals of care were discussed. remains full code.  -Palliative care recommended outpatient follow-up with palliative providers. -Reconsult Palliative medicine 1/27   Data Reviewed: Basic Metabolic Panel: No results for input(s): NA, K, CL, CO2, GLUCOSE, BUN, CREATININE, CALCIUM, MG, PHOS in the last 168 hours. Liver Function Tests: No results for input(s): AST, ALT, ALKPHOS, BILITOT, PROT, ALBUMIN in the last 168 hours. No results for input(s): LIPASE, AMYLASE in the last 168 hours. No results for input(s): AMMONIA in the last 168 hours. CBC: No results for input(s): WBC, NEUTROABS, HGB, HCT, MCV, PLT in the last 168 hours. Cardiac Enzymes: No results for input(s): CKTOTAL, CKMB, CKMBINDEX, TROPONINI in the last 168 hours. BNP (last 3 results) Recent Labs    02/15/20 0433  BNP 3.9    ProBNP (last 3 results) No results for input(s): PROBNP in the last 8760 hours.  CBG: Recent Labs  Lab 03/11/20 1626 03/11/20  2015 03/11/20 2308 03/12/20 0326 03/12/20 0717  GLUCAP 94 137* 158* 103* 100*    No results found for this or any previous visit (from the past 240 hour(s)).   Studies: No results found.  Scheduled Meds: . acetaminophen (TYLENOL) oral liquid 160 mg/5 mL   650 mg Per Tube Q6H  . amLODipine  10 mg Per Tube Daily  . bacitracin   Topical BID  . baclofen  30 mg Per Tube TID  . chlorhexidine gluconate (MEDLINE KIT)  15 mL Mouth Rinse BID  . Darunavir-Cobicisctat-Emtricitabine-Tenofovir Alafenamide  1 tablet Oral Q breakfast  . famotidine  10.4 mg Per Tube BID  . feeding supplement (OSMOLITE 1.5 CAL)  237 mL Per Tube Q24H  . feeding supplement (OSMOLITE 1.5 CAL)  474 mL Per Tube TID  . feeding supplement (PROSource TF)  45 mL Per Tube TID  . fiber  1 packet Per Tube BID  . folic acid  1 mg Per Tube Daily  . free water  200 mL Per Tube Q4H  . gabapentin  200 mg Per Tube Q8H  . heparin injection (subcutaneous)  5,000 Units Subcutaneous Q8H  . levETIRAcetam  1,500 mg Per Tube BID  . magic mouthwash w/lidocaine  5 mL Oral QID  . Muscle Rub   Topical QID  . oxyCODONE  5 mg Per Tube Q6H  . polyethylene glycol  17 g Per Tube QHS  . QUEtiapine  12.5 mg Per Tube QHS  . scopolamine  1 patch Transdermal Q72H  . sulfamethoxazole-trimethoprim  1 tablet Per Tube Once per day on Mon Wed Fri  . thiamine  100 mg Per Tube Daily  . tiZANidine  6 mg Per Tube Q6H   Continuous Infusions:   Active Problems:   Cardiac arrest [facility]   Community acquired pneumonia of left upper lobe of lung   Anoxic brain injury ([place])   Alcohol abuse   Acute respiratory failure with hypoxia (HCC)   Vomiting   Pressure injury of skin   Small bowel obstruction (HCC)   Palliative care encounter   Bowel obstruction (HCC)   Chronic respiratory failure (HCC)   Diastolic dysfunction   Tracheostomy dependent (HCC)   Dysphagia   Protein-calorie malnutrition (HCC)   Seizures (HCC)   Bilateral recurrent inguinal hernia   Muscle spasticity   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HCC)   Tracheobronchitis   Sequela of Corynebacterium infection   Abnormal urinalysis   Urinary tract infection due to Enterococcus   Fever   Consultants:  PCCM  Palliative  medicine  General surgery  Neurology  Interventional radiology  Procedures:  EEG  Echocardiogram  Tracheostomy  Antibiotics: Anti-infectives (From admission, onward)   Start     Dose/Rate Route Frequency Ordered Stop   02/17/20 1100  nitrofurantoin (FURADANTIN) 25 MG/5ML suspension 100 mg        100 mg Per Tube Every 12 hours 02/17/20 1006 02/20/20 0924   02/15/20 1000  vancomycin (VANCOCIN) IVPB 1000 mg/200 mL premix  Status:  Discontinued        1,000 mg 200 mL/hr over 60 Minutes Intravenous Every 12 hours 02/14/20 2226 02/16/20 0742   02/14/20 2245  piperacillin-tazobactam (ZOSYN) IVPB 3.375 g  Status:  Discontinued        3.375 g 12.5 mL/hr over 240 Minutes Intravenous Every 8 hours 02/14/20 2226 02/16/20 0742   02/14/20 2245  vancomycin (VANCOREADY) IVPB 1500 mg/300 mL        1,500 mg 150 mL/hr over 120  Minutes Intravenous  Once 02/14/20 2226 02/15/20 0739   02/01/20 2200  amoxicillin-clavulanate (AUGMENTIN) 250-62.5 MG/5ML suspension 500 mg        500 mg Per Tube Every 8 hours 02/01/20 1343 02/06/20 1333   01/31/20 1015  levofloxacin (LEVAQUIN) 25 MG/ML solution 500 mg  Status:  Discontinued        500 mg Per Tube Daily 01/31/20 0919 02/01/20 1343   01/31/20 1000  cefTRIAXone (ROCEPHIN) 1 g in sodium chloride 0.9 % 100 mL IVPB  Status:  Discontinued        1 g 200 mL/hr over 30 Minutes Intravenous Every 24 hours 01/31/20 0905 01/31/20 0919   01/19/20 2200  linezolid (ZYVOX) tablet 600 mg        600 mg Per Tube Every 12 hours 01/19/20 1534 01/25/20 2019   01/19/20 1100  levofloxacin (LEVAQUIN) tablet 500 mg  Status:  Discontinued        500 mg Per Tube Daily 01/19/20 1004 01/19/20 1534   12/19/19 0930  Darunavir-Cobicisctat-Emtricitabine-Tenofovir Alafenamide (SYMTUZA) 800-150-200-10 MG TABS 1 tablet        1 tablet Oral Daily with breakfast 12/19/19 0840     12/13/19 1000  bictegravir-emtricitabine-tenofovir AF (BIKTARVY) 50-200-25 MG per tablet 1 tablet  Status:   Discontinued        1 tablet Oral Daily 12/12/19 1413 12/19/19 0840   11/17/19 1548  ceFAZolin (ANCEF) 2-4 GM/100ML-% IVPB       Note to Pharmacy: Domenick Bookbinder   : cabinet override      11/17/19 1548 11/18/19 0359   11/16/19 1515  ceFAZolin (ANCEF) IVPB 2g/100 mL premix        2 g 200 mL/hr over 30 Minutes Intravenous To Radiology 11/16/19 1511 11/17/19 1625   11/15/19 0900  sulfamethoxazole-trimethoprim (BACTRIM DS) 800-160 MG per tablet 1 tablet        1 tablet Per Tube Once per day on Mon Wed Fri 11/13/19 1906     11/13/19 1615  dolutegravir (TIVICAY) tablet 50 mg  Status:  Discontinued        50 mg Oral Daily 11/13/19 1521 12/12/19 1413   11/13/19 1615  emtricitabine-tenofovir AF (DESCOVY) 200-25 MG per tablet 1 tablet  Status:  Discontinued        1 tablet Per Tube Daily 11/13/19 1521 12/12/19 1413   11/13/19 1530  sulfamethoxazole-trimethoprim (BACTRIM DS) 800-160 MG per tablet 1 tablet  Status:  Discontinued        1 tablet Oral Once per day on Mon Wed Fri 11/13/19 1441 11/13/19 1906   11/13/19 1500  bictegravir-emtricitabine-tenofovir AF (BIKTARVY) 50-200-25 MG per tablet 1 tablet  Status:  Discontinued        1 tablet Oral Daily 11/13/19 1403 11/13/19 1521   11/10/19 1000  erythromycin 250 mg in sodium chloride 0.9 % 100 mL IVPB        250 mg 100 mL/hr over 60 Minutes Intravenous Every 8 hours 11/10/19 0907 11/11/19 2000   11/07/19 1200  ceFEPIme (MAXIPIME) 2 g in sodium chloride 0.9 % 100 mL IVPB        2 g 200 mL/hr over 30 Minutes Intravenous Every 8 hours 11/07/19 1013 11/13/19 2127   11/03/19 1200  cefTRIAXone (ROCEPHIN) 2 g in sodium chloride 0.9 % 100 mL IVPB        2 g 200 mL/hr over 30 Minutes Intravenous Every 24 hours 11/03/19 0922 11/05/19 1427   11/02/19 0800  vancomycin (VANCOREADY) IVPB 750 mg/150 mL  Status:  Discontinued        750 mg 150 mL/hr over 60 Minutes Intravenous Every 12 hours 11/01/19 1935 11/02/19 1105   11/02/19 0600  piperacillin-tazobactam  (ZOSYN) IVPB 3.375 g  Status:  Discontinued        3.375 g 12.5 mL/hr over 240 Minutes Intravenous Every 8 hours 11/02/19 0311 11/03/19 0920   11/01/19 2200  ceFEPIme (MAXIPIME) 2 g in sodium chloride 0.9 % 100 mL IVPB  Status:  Discontinued        2 g 200 mL/hr over 30 Minutes Intravenous Every 12 hours 11/01/19 2143 11/02/19 0311   11/01/19 1915  vancomycin (VANCOREADY) IVPB 1500 mg/300 mL        1,500 mg 150 mL/hr over 120 Minutes Intravenous  Once 11/01/19 1909 11/01/19 2147   11/01/19 1845  cefTRIAXone (ROCEPHIN) 1 g in sodium chloride 0.9 % 100 mL IVPB        1 g 200 mL/hr over 30 Minutes Intravenous  Once 11/01/19 1842 11/01/19 2051   11/01/19 1845  azithromycin (ZITHROMAX) 500 mg in sodium chloride 0.9 % 250 mL IVPB        500 mg 250 mL/hr over 60 Minutes Intravenous  Once 11/01/19 1842 11/01/19 2051       Time spent: 20 minutes    Erin Hearing ANP  [facility]  7 am-330 pm/M-F for direct patient care and secure chat Please refer to [place] for contact information 03/12/2020, 7:46 AM  LOS: 132 days

## 2020-03-13 LAB — GLUCOSE, CAPILLARY
Glucose-Capillary: 104 mg/dL — ABNORMAL HIGH (ref 70–99)
Glucose-Capillary: 108 mg/dL — ABNORMAL HIGH (ref 70–99)
Glucose-Capillary: 166 mg/dL — ABNORMAL HIGH (ref 70–99)
Glucose-Capillary: 93 mg/dL (ref 70–99)
Glucose-Capillary: 171 mg/dL — ABNORMAL HIGH (ref 70–99)
Glucose-Capillary: 157 mg/dL — ABNORMAL HIGH (ref 70–99)

## 2020-03-13 NOTE — Progress Notes (Signed)
[facility] PROGRESS NOTE  Ryan Orozco ZOX:[id number] DOB: 1965/10/17 DOA: 11/01/2019 PCP: Default, Provider, MD    11/16   Status: Remains inpatient appropriate because:Altered mental status, Unsafe d/c plan and Inpatient level of care appropriate due to severity of illness. Patient newly diagnosed with anoxic brain injury  Dispo: The patient is from: Home              Anticipated d/c is to: SNF              Anticipated d/c date is: > 3 days              Patient currently is medically stable to d/c.  Barriers to discharge: No SNF bed offers.  Needs trach capable facility.  Still awaiting approval of Medicaid and disability.  No bed offers.   Code Status: DNR after d/w palliative team on 1/27 Family Communication: 2/7 dtr Ryan Orozco will be in [place] on 2/21 DVT prophylaxis: Subcutaneous heparin Vaccination status: Will order first dose Pfizer COVID-vaccine on 1/10; next week we will order pneumococcal and influenza vaccinations-2nd dose ordered 277  Foley catheter:  No  HPI: 55 year old male with HIV, chronic alcohol abuse who was presented to the emergency department after being found unresponsive outside a convinient store.  EMS found him pulseless in PEA, unknown downtime, successfully resuscitated and brought to the emergency department.  UDS positive for THC and benzodiazepines.  CT head unremarkable.  Failed extubation trials due to severe anoxic brain injury and had tracheostomy placed.  PEG placed on 10/15.  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  This has led to significant pain as well.  [name] was consulted and recommendations/input/orders highly appreciated.  She may also benefit from Botox injections to both hips to aid in treatment of spasticity.   Significant Hospital Events   9/29 Admit post PEA arrest. UDS positive for THC, benzo's. ETOH 177 10/05 EEG  ongoing. Versed restarted overnight due to agitation. Nicardipine increased.  10/06 Developed tachypnea with WUA, no follow commands off sedation  10/07 Vomiting, TF held / restarted with recurrent vomiting 10/16 tracheostomy collar for 9 hours 10/18->10/19 off vent all night  10/20> TC 11/15 > downsized to #4 Shiley flex CFS  Subjective: Sleeping soundly so did not awaken  Objective: Vitals:   03/13/20 0332 03/13/20 0504  BP:  117/74  Pulse: 76 92  Resp: 16 20  Temp:  97.9 F (36.6 C)  SpO2: 95% 99%    Intake/Output Summary (Last 24 hours) at 03/13/2020 0732 Last data filed at 03/13/2020 0349 Gross per 24 hour  Intake 948 ml  Output 1400 ml  Net -452 ml   Filed Weights   03/04/20 0500 03/05/20 0500 03/06/20 0500  Weight: 73 kg 71.5 kg 72 kg    Exam: General: Sleeping and in no apparent distress Pulmonary: Lungs clear without increased work of breathing FiO2 21% trach tube with trach collar for humidification only Cardiac: Heart sounds are normal, pulses regular, no peripheral edema Abdomen: Abdomen soft with normoactive bowel sounds.  PEG tube LBM 2/7 Neurological: Severe hypertonicity/spasticity bilateral lower extremities involving the hip flexors and hamstrings; primarily keeps bilateral hips flexed -continues to have pain w/ attempts to extend either leg especially the right leg.  Nonpurposeful use of upper extremities.   Psychiatric: Awake, given incomprehensible speech unable to assess orientation.  Pleasant affect.  Assessment/Plan: Acute problems: Anoxic brain injury 2/2 PEA arrest/pain syndrome secondary to hypertonicity/spastic tetraplegia:  -  Presumed 2/2 combination alcohol and BZD overdose with UDS positive for THC and BZDs -Significant brain injury/spastic tetraplegia -needs long-term SNF -Continue baclofen, gabapentin, scheduled oxycodone and Zanaflex -Appreciate assistance of [name]; 1/6 Botox injection into the quadriceps and rectus femoris as well as  the biceps femoris and hamstrings, semimembranosus and semitendinosus.  Unable to inject iliopsoas without EMG guidance.  Unfortunately no significant improvement and spasticity hypertonicity. -Continue bilateral UE WHO resting splints along w/ PROM per nursing staff every 4 hours -Continue low-dose Seroquel for brain injury sequela -Continue KREG rotational bed -Patient has met all PT goals i.e. he has not been able to make any significant progress therefore PT has signed off.  I discussed with PT today regarding best modality to get patient down to chair.  Stated previously had been using a lift and securing patient with a belt.  She agreed with my plan to do the same as well as wedging him with pillows and if necessary partially reclining chair to prevent patient from falling out.  She agrees that getting patient out of bed to chair and rolling to nurses station for socialization would be in the best interest of the patient.  Low-grade fever/abnormal urinalysis -2/8: Solitary T-max 100.5 overnight.  Likely related to atelectasis.  Obtain chest x-ray today that was unremarkable -As a precaution we will go ahead and obtain urinalysis and culture and blood cultures; electrolytes unremarkable, LDH normal at 178, no leukocytosis -Urinalysis abnormal with hazy appearance, trace leukocytes, rare bacteria and 11-20 WBCs-urine culture pending -Blood cultures pending and currently without any obvious growth  Goals of care:  -Palliative care team reconsulted on 1/27 and after an extensive discussion daughter does realize that patient quite debilitated and would not survive a resuscitation therefore patient made a DNR  Chronic respiratory failure with inability to maintain patent airway requiring chronic tracheostomy tube/Recent Corynebacterium tracheobronchitis (resolved) -Continue #4 cuffless trach in place-trach changed out on 1/10 RT -Given altered mentation and inability to manage secretions PCCM states  patient is not a candidate for elective decannulation  Fever/peri-PEG drainage positive for corynebacterium and rare Pseudomonas -PEG site with some drainage but no erythema, induration or pain -Suspect may have colonization noting recently had corynebacterium tracheobronchitis treated -continue bacitracin ointment  HIV:  -CD4 count of 124 October 2021 (had been as high as 250 Dec 2020)-as of today up to 259; viral load obtained on 1/15 was <20 -Dc'd Tivicay and Descovy infavor of Biktarvy to for eventual dc to SNF; per my discussion with infectious disease physician [name] on 12/23 preferred regimen would be Descovy and Tivicay-she will contact the HIV pharmacist to attempt to obtain discount cards to make this a more affordable option.  Once this has been confirmed will transition back to Descovy and Tivicay -CD4 count on 12/23 up to 259 -ID recommends continuing prophylactic Bactrim an additional 3 months  Dysphagia 2/2 anoxic brain injury -Continue tube feeding per PEG -Given altered mentation patient not a candidate for further speech evaluations and will be permanently dependent on enteral tube feedings  Constipation -We will continue scheduled Colace, fiber supplements per tube as well as daily MiraLAX -Suspect requirement for narcotics and multiple muscle relaxers contributing to decreased GI motility and constipation symptoms  Protein calorie malnutrition nutrition Status: Nutrition Problem: Increased nutrient needs Etiology: acute illness Signs/Symptoms: estimated needs Interventions: Tube feeding bolus doses of Osmolite, Juven twice daily, Prosource TF 45 mL 3 times daily Estimated body mass index is 25.62 kg/m as calculated from the  following:   Height as of this encounter: _0  (1.676 m).   Weight as of this encounter: 72 kg.   Multiple decubiti not POA Wound / Incision (Open or Dehisced) 11/17/19 Puncture Abdomen Left;Anterior;Upper G-tube insertion site  (Active)   Date First Assessed/Time First Assessed: 11/17/19 1646   Wound Type: Puncture  Location: Abdomen  Location Orientation: Left;Anterior;Upper  Wound Description (Comments): G-tube insertion site   Present on Admission: No    Assessments 11/17/2019  4:47 PM 03/11/2020  8:00 PM  Dressing Type Gauze (Comment);Tape dressing Gauze (Comment)  Dressing Changed New --  Dressing Status Clean;Dry;Intact Intact;Clean;Dry  Dressing Change Frequency PRN Daily  Site / Wound Assessment Clean;Dry;Pink Clean;Dry  Peri-wound Assessment Intact --  Margins -- Unattached edges (unapproximated)  Closure None None  Drainage Amount None --  Non-staged Wound Description -- Not applicable  Treatment Cleansed --     No Linked orders to display      Other problems: Hypertension/grade 1 diastolic dysfunction:  -Continue amlodipine  -Echocardiogram this admission with preserved LVEF with evidence of grade   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   -Continue Keppra; levetiracetam level 35.9 on 11/12 -no further seizure activity since transitioned out of ICU setting therefore will discontinue seizure precaution  SIRS/Fever 2/2 recurrent enterococcus UTI -Patient developed mild elevation in temperature overnight to 1/13 associated with slight increase in respiratory secretions and congested sounding cough/abnormal pulmonary exam as well as tachycardia./14 respiratory status much improved and no further fevers. -Respiratory viral panel neg for COVID, influenza and RSV.   -UA slightly abnormal/cx c/w enterococcus UTI- started on nitrofurantoin (prior UTI treated w/ augmentin) has completed 3 days of treatment -X-ray with bilateral opacities concerning for early pneumonia but CT of the chest without pneumonia, broad-spectrum antibiotics dc'd 1/14 -Because of dehydration increased frequency of free water from 200 cc every 4 hours to every 2 hours on 1/13- stopped on 1/15  Inguinal hernias -Evaluated  by general surgery this admission. Documented as chronically incarcerated. Patient has been clinically stable and tolerating tube feedings and having bowel movements so unless patient obstructed would not be considered a candidate for repair. Even if obstructed consulting surgeon was not sure he would be a candidate for hernia repair regardless  Constipation -No documented bowel movement since 1/13 -We will continue scheduled Colace, fiber supplements per tube -Add daily MiraLAX -Given one-time dose milk of magnesia on 1/20-no documented bowel movement on flowsheet -Suspect requirement for narcotics and multiple muscle relaxers contributing to decreased GI motility and constipation symptoms  Goals of care:  -Palliative care was involved during this hospitalization.   -Goals of care were discussed. remains full code.  -Palliative care recommended outpatient follow-up with palliative providers. -Reconsult Palliative medicine 1/27   Data Reviewed: Basic Metabolic Panel: Recent Labs  Lab 03/12/20 0919  NA 144  K 3.9  CL 106  CO2 26  GLUCOSE 96  BUN 16  CREATININE 0.70  CALCIUM 10.9*   Liver Function Tests: Recent Labs  Lab 03/12/20 0919  AST 25  ALT 18  ALKPHOS 93  BILITOT 0.5  PROT 8.0  ALBUMIN 3.5   No results for input(s): LIPASE, AMYLASE in the last 168 hours. No results for input(s): AMMONIA in the last 168 hours. CBC: Recent Labs  Lab 03/12/20 0919  WBC 4.6  NEUTROABS 3.3  HGB 14.2  HCT 42.9  MCV 94.1  PLT 179   Cardiac Enzymes: No results for input(s): CKTOTAL, CKMB, CKMBINDEX, TROPONINI in the last  168 hours. BNP (last 3 results) Recent Labs    02/15/20 0433  BNP 3.9    ProBNP (last 3 results) No results for input(s): PROBNP in the last 8760 hours.  CBG: Recent Labs  Lab 03/12/20 1139 03/12/20 1710 03/12/20 1949 03/12/20 2307 03/13/20 0322  GLUCAP 159* 114* 169* 97 104*    No results found for this or any previous visit (from the past  240 hour(s)).   Studies: DG CHEST PORT 1 VIEW  Result Date: 03/12/2020 CLINICAL DATA:  Leukocytosis EXAM: PORTABLE CHEST 1 VIEW COMPARISON:  02/14/2020 FINDINGS: Cardiac shadow is enlarged but stable. Tracheostomy tube is noted in satisfactory position. Lungs are well aerated bilaterally. No focal infiltrate or sizable effusion is seen. No bony abnormality is noted. IMPRESSION: No acute abnormality noted. Electronically Signed   By: Inez Catalina M.D.   On: 03/12/2020 08:17    Scheduled Meds: . acetaminophen (TYLENOL) oral liquid 160 mg/5 mL  650 mg Per Tube Q6H  . amLODipine  10 mg Per Tube Daily  . bacitracin   Topical BID  . baclofen  30 mg Per Tube TID  . chlorhexidine gluconate (MEDLINE KIT)  15 mL Mouth Rinse BID  . Darunavir-Cobicisctat-Emtricitabine-Tenofovir Alafenamide  1 tablet Oral Q breakfast  . famotidine  10.4 mg Per Tube BID  . feeding supplement (OSMOLITE 1.5 CAL)  237 mL Per Tube Q24H  . feeding supplement (OSMOLITE 1.5 CAL)  474 mL Per Tube TID  . feeding supplement (PROSource TF)  45 mL Per Tube TID  . fiber  1 packet Per Tube BID  . folic acid  1 mg Per Tube Daily  . free water  200 mL Per Tube Q4H  . gabapentin  200 mg Per Tube Q8H  . heparin injection (subcutaneous)  5,000 Units Subcutaneous Q8H  . levETIRAcetam  1,500 mg Per Tube BID  . magic mouthwash w/lidocaine  5 mL Oral QID  . Muscle Rub   Topical QID  . oxyCODONE  5 mg Per Tube Q6H  . polyethylene glycol  17 g Per Tube QHS  . QUEtiapine  12.5 mg Per Tube QHS  . scopolamine  1 patch Transdermal Q72H  . sulfamethoxazole-trimethoprim  1 tablet Per Tube Once per day on Mon Wed Fri  . thiamine  100 mg Per Tube Daily  . tiZANidine  6 mg Per Tube Q6H   Continuous Infusions:   Active Problems:   Cardiac arrest [facility])   Community acquired pneumonia of left upper lobe of lung   Anoxic brain injury ([place])   Alcohol abuse   Acute respiratory failure with hypoxia (HCC)   Vomiting   Pressure injury of skin    Small bowel obstruction (HCC)   Palliative care encounter   Bowel obstruction (HCC)   Chronic respiratory failure (HCC)   Diastolic dysfunction   Tracheostomy dependent (HCC)   Dysphagia   Protein-calorie malnutrition (HCC)   Seizures (HCC)   Bilateral recurrent inguinal hernia   Muscle spasticity   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HCC)   Tracheobronchitis   Sequela of Corynebacterium infection   Abnormal urinalysis   Urinary tract infection due to Enterococcus   Fever   Consultants:  PCCM  Palliative medicine  General surgery  Neurology  Interventional radiology  Procedures:  EEG  Echocardiogram  Tracheostomy  Antibiotics: Anti-infectives (From admission, onward)   Start     Dose/Rate Route Frequency Ordered Stop   02/17/20 1100  nitrofurantoin (FURADANTIN) 25 MG/5ML suspension 100 mg  100 mg Per Tube Every 12 hours 02/17/20 1006 02/20/20 0924   02/15/20 1000  vancomycin (VANCOCIN) IVPB 1000 mg/200 mL premix  Status:  Discontinued        1,000 mg 200 mL/hr over 60 Minutes Intravenous Every 12 hours 02/14/20 2226 02/16/20 0742   02/14/20 2245  piperacillin-tazobactam (ZOSYN) IVPB 3.375 g  Status:  Discontinued        3.375 g 12.5 mL/hr over 240 Minutes Intravenous Every 8 hours 02/14/20 2226 02/16/20 0742   02/14/20 2245  vancomycin (VANCOREADY) IVPB 1500 mg/300 mL        1,500 mg 150 mL/hr over 120 Minutes Intravenous  Once 02/14/20 2226 02/15/20 0739   02/01/20 2200  amoxicillin-clavulanate (AUGMENTIN) 250-62.5 MG/5ML suspension 500 mg        500 mg Per Tube Every 8 hours 02/01/20 1343 02/06/20 1333   01/31/20 1015  levofloxacin (LEVAQUIN) 25 MG/ML solution 500 mg  Status:  Discontinued        500 mg Per Tube Daily 01/31/20 0919 02/01/20 1343   01/31/20 1000  cefTRIAXone (ROCEPHIN) 1 g in sodium chloride 0.9 % 100 mL IVPB  Status:  Discontinued        1 g 200 mL/hr over 30 Minutes Intravenous Every 24 hours 01/31/20 0905 01/31/20 0919    01/19/20 2200  linezolid (ZYVOX) tablet 600 mg        600 mg Per Tube Every 12 hours 01/19/20 1534 01/25/20 2019   01/19/20 1100  levofloxacin (LEVAQUIN) tablet 500 mg  Status:  Discontinued        500 mg Per Tube Daily 01/19/20 1004 01/19/20 1534   12/19/19 0930  Darunavir-Cobicisctat-Emtricitabine-Tenofovir Alafenamide (SYMTUZA) 800-150-200-10 MG TABS 1 tablet        1 tablet Oral Daily with breakfast 12/19/19 0840     12/13/19 1000  bictegravir-emtricitabine-tenofovir AF (BIKTARVY) 50-200-25 MG per tablet 1 tablet  Status:  Discontinued        1 tablet Oral Daily 12/12/19 1413 12/19/19 0840   11/17/19 1548  ceFAZolin (ANCEF) 2-4 GM/100ML-% IVPB       Note to Pharmacy: Domenick Bookbinder   : cabinet override      11/17/19 1548 11/18/19 0359   11/16/19 1515  ceFAZolin (ANCEF) IVPB 2g/100 mL premix        2 g 200 mL/hr over 30 Minutes Intravenous To Radiology 11/16/19 1511 11/17/19 1625   11/15/19 0900  sulfamethoxazole-trimethoprim (BACTRIM DS) 800-160 MG per tablet 1 tablet        1 tablet Per Tube Once per day on Mon Wed Fri 11/13/19 1906     11/13/19 1615  dolutegravir (TIVICAY) tablet 50 mg  Status:  Discontinued        50 mg Oral Daily 11/13/19 1521 12/12/19 1413   11/13/19 1615  emtricitabine-tenofovir AF (DESCOVY) 200-25 MG per tablet 1 tablet  Status:  Discontinued        1 tablet Per Tube Daily 11/13/19 1521 12/12/19 1413   11/13/19 1530  sulfamethoxazole-trimethoprim (BACTRIM DS) 800-160 MG per tablet 1 tablet  Status:  Discontinued        1 tablet Oral Once per day on Mon Wed Fri 11/13/19 1441 11/13/19 1906   11/13/19 1500  bictegravir-emtricitabine-tenofovir AF (BIKTARVY) 50-200-25 MG per tablet 1 tablet  Status:  Discontinued        1 tablet Oral Daily 11/13/19 1403 11/13/19 1521   11/10/19 1000  erythromycin 250 mg in sodium chloride 0.9 % 100 mL IVPB  250 mg 100 mL/hr over 60 Minutes Intravenous Every 8 hours 11/10/19 0907 11/11/19 2000   11/07/19 1200  ceFEPIme  (MAXIPIME) 2 g in sodium chloride 0.9 % 100 mL IVPB        2 g 200 mL/hr over 30 Minutes Intravenous Every 8 hours 11/07/19 1013 11/13/19 2127   11/03/19 1200  cefTRIAXone (ROCEPHIN) 2 g in sodium chloride 0.9 % 100 mL IVPB        2 g 200 mL/hr over 30 Minutes Intravenous Every 24 hours 11/03/19 0922 11/05/19 1427   11/02/19 0800  vancomycin (VANCOREADY) IVPB 750 mg/150 mL  Status:  Discontinued        750 mg 150 mL/hr over 60 Minutes Intravenous Every 12 hours 11/01/19 1935 11/02/19 1105   11/02/19 0600  piperacillin-tazobactam (ZOSYN) IVPB 3.375 g  Status:  Discontinued        3.375 g 12.5 mL/hr over 240 Minutes Intravenous Every 8 hours 11/02/19 0311 11/03/19 0920   11/01/19 2200  ceFEPIme (MAXIPIME) 2 g in sodium chloride 0.9 % 100 mL IVPB  Status:  Discontinued        2 g 200 mL/hr over 30 Minutes Intravenous Every 12 hours 11/01/19 2143 11/02/19 0311   11/01/19 1915  vancomycin (VANCOREADY) IVPB 1500 mg/300 mL        1,500 mg 150 mL/hr over 120 Minutes Intravenous  Once 11/01/19 1909 11/01/19 2147   11/01/19 1845  cefTRIAXone (ROCEPHIN) 1 g in sodium chloride 0.9 % 100 mL IVPB        1 g 200 mL/hr over 30 Minutes Intravenous  Once 11/01/19 1842 11/01/19 2051   11/01/19 1845  azithromycin (ZITHROMAX) 500 mg in sodium chloride 0.9 % 250 mL IVPB        500 mg 250 mL/hr over 60 Minutes Intravenous  Once 11/01/19 1842 11/01/19 2051       Time spent: 20 minutes    Erin Hearing ANP  [facility]  7 am-330 pm/M-F for direct patient care and secure chat Please refer to [place] for contact information 03/13/2020, 7:32 AM  LOS: 133 days

## 2020-03-13 NOTE — Plan of Care (Signed)
  Problem: Clinical Measurements: Goal: Diagnostic test results will improve Outcome: Progressing Goal: Respiratory complications will improve Outcome: Progressing Goal: Cardiovascular complication will be avoided Outcome: Progressing   Problem: Activity: Goal: Risk for activity intolerance will decrease Outcome: Progressing   Problem: Nutrition: Goal: Adequate nutrition will be maintained Outcome: Progressing   Problem: Coping: Goal: Level of anxiety will decrease Outcome: Progressing   Problem: Elimination: Goal: Will not experience complications related to bowel motility Outcome: Progressing Goal: Will not experience complications related to urinary retention Outcome: Progressing   Problem: Pain Managment: Goal: General experience of comfort will improve Outcome: Progressing   Problem: Safety: Goal: Ability to remain free from injury will improve Outcome: Progressing   Problem: Skin Integrity: Goal: Risk for impaired skin integrity will decrease Outcome: Progressing   Problem: Education: Goal: 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 Outcome: Progressing   Problem: Activity: Goal: Ability to tolerate increased activity will improve Outcome: Progressing   Problem: Health Behavior/Discharge Planning: Goal: 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 Outcome: Progressing   Problem: Respiratory: Goal: Patent airway maintenance will improve Outcome: Progressing   Problem: Role Relationship: Goal: Ability to communicate will improve Outcome: Progressing   Problem: Tissue Perfusion: Goal: Ability to maintain intracranial pressure will improve Outcome: Progressing   Problem: Respiratory: Goal: Will regain and/or maintain adequate ventilation Outcome: Progressing   Problem: Skin Integrity: Goal: Risk for impaired skin integrity will decrease Outcome: Progressing Goal: Demonstration of wound healing without infection will  improve Outcome: Progressing   Problem: Psychosocial: Goal: Ability to verbalize positive feelings about self will improve Outcome: Progressing Goal: Ability to participate in self-care as condition permits will improve Outcome: Progressing Goal: Ability to identify appropriate support needs will improve Outcome: Progressing   Problem: Health Behavior/Discharge Planning: Goal: Ability to manage health-related needs will improve Outcome: Progressing   Problem: Nutritional: Goal: Risk of aspiration will decrease Outcome: Progressing Goal: Dietary intake will improve Outcome: Progressing   Problem: Communication: Goal: Ability to communicate needs accurately will improve Outcome: Progressing

## 2020-03-14 LAB — GLUCOSE, CAPILLARY
Glucose-Capillary: 93 mg/dL (ref 70–99)
Glucose-Capillary: 95 mg/dL (ref 70–99)
Glucose-Capillary: 114 mg/dL — ABNORMAL HIGH (ref 70–99)
Glucose-Capillary: 115 mg/dL — ABNORMAL HIGH (ref 70–99)
Glucose-Capillary: 162 mg/dL — ABNORMAL HIGH (ref 70–99)
Glucose-Capillary: 119 mg/dL — ABNORMAL HIGH (ref 70–99)

## 2020-03-14 NOTE — Plan of Care (Signed)
  Problem: Clinical Measurements: Goal: Will remain free from infection Outcome: Progressing Goal: Diagnostic test results will improve Outcome: Progressing Goal: Respiratory complications will improve Outcome: Progressing Goal: Cardiovascular complication will be avoided Outcome: Progressing   Problem: Activity: Goal: Risk for activity intolerance will decrease Outcome: Progressing   Problem: Nutrition: Goal: Adequate nutrition will be maintained Outcome: Progressing   Problem: Coping: Goal: Level of anxiety will decrease Outcome: Progressing   Problem: Elimination: Goal: Will not experience complications related to bowel motility Outcome: Progressing Goal: Will not experience complications related to urinary retention Outcome: Progressing   Problem: Pain Managment: Goal: General experience of comfort will improve Outcome: Progressing   Problem: Safety: Goal: Ability to remain free from injury will improve Outcome: Progressing   Problem: Skin Integrity: Goal: Risk for impaired skin integrity will decrease Outcome: Progressing   Problem: Education: Goal: 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 Outcome: Progressing   Problem: Activity: Goal: Ability to tolerate increased activity will improve Outcome: Progressing   Problem: Health Behavior/Discharge Planning: Goal: 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 Outcome: Progressing   Problem: Respiratory: Goal: Patent airway maintenance will improve Outcome: Progressing   Problem: Role Relationship: Goal: Ability to communicate will improve Outcome: Progressing   Problem: Education: Goal: Knowledge of the prescribed therapeutic regimen Outcome: Progressing Goal: Knowledge of disease or condition will improve Outcome: Progressing   Problem: Clinical Measurements: Goal: Neurologic status will improve Outcome: Progressing   Problem: Tissue Perfusion: Goal: Ability to maintain  intracranial pressure will improve Outcome: Progressing   Problem: Respiratory: Goal: Will regain and/or maintain adequate ventilation Outcome: Progressing   Problem: Skin Integrity: Goal: Risk for impaired skin integrity will decrease Outcome: Progressing Goal: Demonstration of wound healing without infection will improve Outcome: Progressing   Problem: Psychosocial: Goal: Ability to verbalize positive feelings about self will improve Outcome: Progressing Goal: Ability to participate in self-care as condition permits will improve Outcome: Progressing Goal: Ability to identify appropriate support needs will improve Outcome: Progressing   Problem: Health Behavior/Discharge Planning: Goal: Ability to manage health-related needs will improve Outcome: Progressing   Problem: Nutritional: Goal: Risk of aspiration will decrease Outcome: Progressing Goal: Dietary intake will improve Outcome: Progressing   Problem: Communication: Goal: Ability to communicate needs accurately will improve Outcome: Progressing

## 2020-03-14 NOTE — Progress Notes (Signed)
[facility] PROGRESS NOTE  Creighton [place] VCB:[id number] DOB: 1966/01/23 DOA: 11/01/2019 PCP: Default, Provider, MD    11/16   Status: Remains inpatient appropriate because:Altered mental status, Unsafe d/c plan and Inpatient level of care appropriate due to severity of illness. Patient newly diagnosed with anoxic brain injury  Dispo: The patient is from: Home              Anticipated d/c is to: SNF              Anticipated d/c date is: > 3 days              Patient currently is medically stable to d/c.  Barriers to discharge: No SNF bed offers.  Needs trach capable facility.  Still awaiting approval of Medicaid and disability.  No bed offers.   Code Status: DNR after d/w palliative team on 1/27 Family Communication: 2/7 dtr Aquilla Solian will be in [place] on 2/21 DVT prophylaxis: Subcutaneous heparin Vaccination status: Will order first dose Pfizer COVID-vaccine on 1/10; next week we will order pneumococcal and influenza vaccinations-2nd dose ordered 277  Foley catheter:  No  HPI: 55 year old male with HIV, chronic alcohol abuse who was presented to the emergency department after being found unresponsive outside a convinient store.  EMS found him pulseless in PEA, unknown downtime, successfully resuscitated and brought to the emergency department.  UDS positive for THC and benzodiazepines.  CT head unremarkable.  Failed extubation trials due to severe anoxic brain injury and had tracheostomy placed.  PEG placed on 10/15.  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  This has led to significant pain as well.  [name] was consulted and recommendations/input/orders highly appreciated.  She may also benefit from Botox injections to both hips to aid in treatment of spasticity.   Significant Hospital Events   9/29 Admit post PEA arrest. UDS positive for THC, benzo's. ETOH 177 10/05 EEG  ongoing. Versed restarted overnight due to agitation. Nicardipine increased.  10/06 Developed tachypnea with WUA, no follow commands off sedation  10/07 Vomiting, TF held / restarted with recurrent vomiting 10/16 tracheostomy collar for 9 hours 10/18->10/19 off vent all night  10/20> TC 11/15 > downsized to #4 Shiley flex CFS  Subjective: Alert, moving about in the bed.  Unable to verbalize.  Objective: Vitals:   03/14/20 0250 03/14/20 0446  BP:  113/82  Pulse: 91 89  Resp: 18 17  Temp:  98.6 F (37 C)  SpO2: 95% 96%    Intake/Output Summary (Last 24 hours) at 03/14/2020 0741 Last data filed at 03/14/2020 0000 Gross per 24 hour  Intake 400 ml  Output 700 ml  Net -300 ml   Filed Weights   03/05/20 0500 03/06/20 0500 03/14/20 0740  Weight: 71.5 kg 72 kg 74 kg    Exam: General: Awake and alert.  Slightly restless. Pulmonary: Anterior lung sounds clear to auscultation.  FiO2 21% trach tube with trach collar for humidification only.  No secretions Cardiac: Heart sounds S1-S2, no peripheral edema, pulse regular. Abdomen: Nontender nondistended.  PEG tube feedings and medications.  LBM 2/9 Neurological: Severe hypertonicity/spasticity bilateral lower extremities involving the hip flexors and hamstrings; primarily keeps bilateral hips flexed -continues to have pain w/ attempts to extend either leg especially the right leg.  Nonpurposeful use of upper extremities.   Psychiatric: Awake, given incomprehensible speech unable to assess orientation.  Pleasant affect.  Assessment/Plan: Acute problems: Anoxic brain injury 2/2 PEA  arrest/pain syndrome secondary to hypertonicity/spastic tetraplegia:  -Presumed 2/2 combination alcohol and BZD overdose with UDS positive for THC and BZDs -Significant brain injury/spastic tetraplegia -needs long-term SNF -Continue baclofen, gabapentin, scheduled oxycodone and Zanaflex -Appreciate assistance of [name]; 1/6 Botox injection into the  quadriceps and rectus femoris as well as the biceps femoris and hamstrings, semimembranosus and semitendinosus.  Unable to inject iliopsoas without EMG guidance w/o significant improvement and spasticity hypertonicity. -Continue bilateral UE WHO resting splints/PROM q 4 hours/KREG bed -Continue Seroquel  -Trial out of bed to chair with lift today  Low-grade fever/abnormal urinalysis -2/8: Solitary T-max 100.5 overnight.  Likely related to atelectasis.  Obtain chest x-ray today that was unremarkable -As a precaution we will go ahead and obtain urinalysis and culture and blood cultures; electrolytes unremarkable, LDH normal at 178, no leukocytosis -Urinalysis abnormal with hazy appearance, trace leukocytes, rare bacteria and 11-20 WBCs-urine culture pending -Blood cultures pending and currently without any obvious growth  Goals of care:  -Palliative care team reconsulted on 1/27 and after an extensive discussion daughter does realize that patient quite debilitated and would not survive a resuscitation therefore patient made a DNR  Chronic respiratory failure with inability to maintain patent airway requiring chronic tracheostomy tube/Recent Corynebacterium tracheobronchitis (resolved) -Continue #4 cuffless trach in place-trach changed out on 1/10 RT -Given altered mentation and inability to manage secretions PCCM states patient is not a candidate for elective decannulation  Fever/peri-PEG drainage positive for corynebacterium and rare Pseudomonas -PEG site with some drainage but no erythema, induration or pain -Suspect may have colonization noting recently had corynebacterium tracheobronchitis treated -continue bacitracin ointment  HIV:  -CD4 count of 124 October 2021 (had been as high as 250 Dec 2020)-as of today up to 259; viral load obtained on 1/15 was <20 -Dc'd Tivicay and Descovy infavor of Biktarvy to for eventual dc to SNF; per my discussion with infectious disease physician [name]  on 12/23 preferred regimen would be Descovy and Tivicay-she will contact the HIV pharmacist to attempt to obtain discount cards to make this a more affordable option.  Once this has been confirmed will transition back to Descovy and Tivicay -CD4 count on 12/23 up to 259 -ID recommends continuing prophylactic Bactrim an additional 3 months  Dysphagia 2/2 anoxic brain injury -Continue tube feeding per PEG -Given altered mentation patient not a candidate for further speech evaluations and will be permanently dependent on enteral tube feedings  Constipation -We will continue scheduled Colace, fiber supplements per tube as well as daily MiraLAX -Suspect requirement for narcotics and multiple muscle relaxers contributing to decreased GI motility and constipation symptoms  Protein calorie malnutrition nutrition Status: Nutrition Problem: Increased nutrient needs Etiology: acute illness Signs/Symptoms: estimated needs Interventions: Tube feeding bolus doses of Osmolite, Juven twice daily, Prosource TF 45 mL 3 times daily Estimated body mass index is 26.33 kg/m as calculated from the following:   Height as of this encounter: 5' 6"  (1.676 m).   Weight as of this encounter: 74 kg.   Multiple decubiti not POA Wound / Incision (Open or Dehisced) 11/17/19 Puncture Abdomen Left;Anterior;Upper G-tube insertion site  (Active)  Date First Assessed/Time First Assessed: 11/17/19 1646   Wound Type: Puncture  Location: Abdomen  Location Orientation: Left;Anterior;Upper  Wound Description (Comments): G-tube insertion site   Present on Admission: No    Assessments 11/17/2019  4:47 PM 03/13/2020 10:00 AM  Dressing Type Gauze (Comment);Tape dressing --  Dressing Changed New --  Dressing Status Clean;Dry;Intact --  Dressing Change  Frequency PRN --  Site / Wound Assessment Clean;Dry;Pink --  Peri-wound Assessment Intact --  Wound Length (cm) -- 0 cm  Closure None --  Drainage Amount None --  Treatment Cleansed  --     No Linked orders to display      Other problems: Hypertension/grade 1 diastolic dysfunction:  -Continue amlodipine  -Echocardiogram this admission with preserved LVEF with evidence of grade   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   -Continue Keppra; levetiracetam level 35.9 on 11/12 -no further seizure activity since transitioned out of ICU setting therefore will discontinue seizure precaution  SIRS/Fever 2/2 recurrent enterococcus UTI -Patient developed mild elevation in temperature overnight to 1/13 associated with slight increase in respiratory secretions and congested sounding cough/abnormal pulmonary exam as well as tachycardia./14 respiratory status much improved and no further fevers. -Respiratory viral panel neg for COVID, influenza and RSV.   -UA slightly abnormal/cx c/w enterococcus UTI- started on nitrofurantoin (prior UTI treated w/ augmentin) has completed 3 days of treatment -X-ray with bilateral opacities concerning for early pneumonia but CT of the chest without pneumonia, broad-spectrum antibiotics dc'd 1/14 -Because of dehydration increased frequency of free water from 200 cc every 4 hours to every 2 hours on 1/13- stopped on 1/15  Inguinal hernias -Evaluated by general surgery this admission. Documented as chronically incarcerated. Patient has been clinically stable and tolerating tube feedings and having bowel movements so unless patient obstructed would not be considered a candidate for repair. Even if obstructed consulting surgeon was not sure he would be a candidate for hernia repair regardless  Constipation -No documented bowel movement since 1/13 -We will continue scheduled Colace, fiber supplements per tube -Add daily MiraLAX -Given one-time dose milk of magnesia on 1/20-no documented bowel movement on flowsheet -Suspect requirement for narcotics and multiple muscle relaxers contributing to decreased GI motility and constipation  symptoms  Goals of care:  -Palliative care was involved during this hospitalization.   -Goals of care were discussed. remains full code.  -Palliative care recommended outpatient follow-up with palliative providers. -Reconsult Palliative medicine 1/27   Data Reviewed: Basic Metabolic Panel: Recent Labs  Lab 03/12/20 0919  NA 144  K 3.9  CL 106  CO2 26  GLUCOSE 96  BUN 16  CREATININE 0.70  CALCIUM 10.9*   Liver Function Tests: Recent Labs  Lab 03/12/20 0919  AST 25  ALT 18  ALKPHOS 93  BILITOT 0.5  PROT 8.0  ALBUMIN 3.5   No results for input(s): LIPASE, AMYLASE in the last 168 hours. No results for input(s): AMMONIA in the last 168 hours. CBC: Recent Labs  Lab 03/12/20 0919  WBC 4.6  NEUTROABS 3.3  HGB 14.2  HCT 42.9  MCV 94.1  PLT 179   Cardiac Enzymes: No results for input(s): CKTOTAL, CKMB, CKMBINDEX, TROPONINI in the last 168 hours. BNP (last 3 results) Recent Labs    02/15/20 0433  BNP 3.9    ProBNP (last 3 results) No results for input(s): PROBNP in the last 8760 hours.  CBG: Recent Labs  Lab 03/13/20 1636 03/13/20 1917 03/13/20 2328 03/14/20 0323 03/14/20 0732  GLUCAP 93 171* 157* 93 95    Recent Results (from the past 240 hour(s))  Culture, blood (Routine X 2) w Reflex to ID Panel     Status: None (Preliminary result)   Collection Time: 03/12/20  9:19 AM   Specimen: BLOOD LEFT FOREARM  Result Value Ref Range Status   Specimen Description BLOOD LEFT FOREARM  Final  Special Requests   Final    BOTTLES DRAWN AEROBIC AND ANAEROBIC Blood Culture results may not be optimal due to an inadequate volume of blood received in culture bottles   Culture   Final    NO GROWTH < 24 HOURS Performed at [facility] [address], [place], [place] [address]    Report Status PENDING  Incomplete  Culture, blood (Routine X 2) w Reflex to ID Panel     Status: None (Preliminary result)   Collection Time: 03/12/20  9:22 AM   Specimen: BLOOD   Result Value Ref Range Status   Specimen Description BLOOD LEFT ANTECUBITAL  Final   Special Requests   Final    BOTTLES DRAWN AEROBIC AND ANAEROBIC Blood Culture results may not be optimal due to an inadequate volume of blood received in culture bottles   Culture   Final    NO GROWTH < 24 HOURS Performed at [facility], [place] [address], [place], [place] [address]    Report Status PENDING  Incomplete     Studies: DG CHEST PORT 1 VIEW  Result Date: 03/12/2020 CLINICAL DATA:  Leukocytosis EXAM: PORTABLE CHEST 1 VIEW COMPARISON:  02/14/2020 FINDINGS: Cardiac shadow is enlarged but stable. Tracheostomy tube is noted in satisfactory position. Lungs are well aerated bilaterally. No focal infiltrate or sizable effusion is seen. No bony abnormality is noted. IMPRESSION: No acute abnormality noted. Electronically Signed   By: Inez Catalina M.D.   On: 03/12/2020 08:17    Scheduled Meds: . acetaminophen (TYLENOL) oral liquid 160 mg/5 mL  650 mg Per Tube Q6H  . amLODipine  10 mg Per Tube Daily  . bacitracin   Topical BID  . baclofen  30 mg Per Tube TID  . chlorhexidine gluconate (MEDLINE KIT)  15 mL Mouth Rinse BID  . Darunavir-Cobicisctat-Emtricitabine-Tenofovir Alafenamide  1 tablet Oral Q breakfast  . famotidine  10.4 mg Per Tube BID  . feeding supplement (OSMOLITE 1.5 CAL)  237 mL Per Tube Q24H  . feeding supplement (OSMOLITE 1.5 CAL)  474 mL Per Tube TID  . feeding supplement (PROSource TF)  45 mL Per Tube TID  . fiber  1 packet Per Tube BID  . folic acid  1 mg Per Tube Daily  . free water  200 mL Per Tube Q4H  . gabapentin  200 mg Per Tube Q8H  . heparin injection (subcutaneous)  5,000 Units Subcutaneous Q8H  . levETIRAcetam  1,500 mg Per Tube BID  . magic mouthwash w/lidocaine  5 mL Oral QID  . Muscle Rub   Topical QID  . oxyCODONE  5 mg Per Tube Q6H  . polyethylene glycol  17 g Per Tube QHS  . QUEtiapine  12.5 mg Per Tube QHS  . scopolamine  1 patch Transdermal Q72H  .  sulfamethoxazole-trimethoprim  1 tablet Per Tube Once per day on Mon Wed Fri  . thiamine  100 mg Per Tube Daily  . tiZANidine  6 mg Per Tube Q6H   Continuous Infusions:   Active Problems:   Cardiac arrest [facility])   Community acquired pneumonia of left upper lobe of lung   Anoxic brain injury ([place])   Alcohol abuse   Acute respiratory failure with hypoxia (HCC)   Vomiting   Pressure injury of skin   Small bowel obstruction (HCC)   Palliative care encounter   Bowel obstruction (HCC)   Chronic respiratory failure (HCC)   Diastolic dysfunction   Tracheostomy dependent ([place])  Dysphagia   Protein-calorie malnutrition ([place])   Seizures ([place])   Bilateral recurrent inguinal hernia   Muscle spasticity   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HCC)   Tracheobronchitis   Sequela of Corynebacterium infection   Abnormal urine   Urinary tract infection due to Enterococcus   Fever   Consultants:  PCCM  Palliative medicine  General surgery  Neurology  Interventional radiology  Procedures:  EEG  Echocardiogram  Tracheostomy  Antibiotics: Anti-infectives (From admission, onward)   Start     Dose/Rate Route Frequency Ordered Stop   02/17/20 1100  nitrofurantoin (FURADANTIN) 25 MG/5ML suspension 100 mg        100 mg Per Tube Every 12 hours 02/17/20 1006 02/20/20 0924   02/15/20 1000  vancomycin (VANCOCIN) IVPB 1000 mg/200 mL premix  Status:  Discontinued        1,000 mg 200 mL/hr over 60 Minutes Intravenous Every 12 hours 02/14/20 2226 02/16/20 0742   02/14/20 2245  piperacillin-tazobactam (ZOSYN) IVPB 3.375 g  Status:  Discontinued        3.375 g 12.5 mL/hr over 240 Minutes Intravenous Every 8 hours 02/14/20 2226 02/16/20 0742   02/14/20 2245  vancomycin (VANCOREADY) IVPB 1500 mg/300 mL        1,500 mg 150 mL/hr over 120 Minutes Intravenous  Once 02/14/20 2226 02/15/20 0739   02/01/20 2200  amoxicillin-clavulanate (AUGMENTIN) 250-62.5 MG/5ML suspension 500 mg         500 mg Per Tube Every 8 hours 02/01/20 1343 02/06/20 1333   01/31/20 1015  levofloxacin (LEVAQUIN) 25 MG/ML solution 500 mg  Status:  Discontinued        500 mg Per Tube Daily 01/31/20 0919 02/01/20 1343   01/31/20 1000  cefTRIAXone (ROCEPHIN) 1 g in sodium chloride 0.9 % 100 mL IVPB  Status:  Discontinued        1 g 200 mL/hr over 30 Minutes Intravenous Every 24 hours 01/31/20 0905 01/31/20 0919   01/19/20 2200  linezolid (ZYVOX) tablet 600 mg        600 mg Per Tube Every 12 hours 01/19/20 1534 01/25/20 2019   01/19/20 1100  levofloxacin (LEVAQUIN) tablet 500 mg  Status:  Discontinued        500 mg Per Tube Daily 01/19/20 1004 01/19/20 1534   12/19/19 0930  Darunavir-Cobicisctat-Emtricitabine-Tenofovir Alafenamide (SYMTUZA) 800-150-200-10 MG TABS 1 tablet        1 tablet Oral Daily with breakfast 12/19/19 0840     12/13/19 1000  bictegravir-emtricitabine-tenofovir AF (BIKTARVY) 50-200-25 MG per tablet 1 tablet  Status:  Discontinued        1 tablet Oral Daily 12/12/19 1413 12/19/19 0840   11/17/19 1548  ceFAZolin (ANCEF) 2-4 GM/100ML-% IVPB       Note to Pharmacy: Domenick Bookbinder   : cabinet override      11/17/19 1548 11/18/19 0359   11/16/19 1515  ceFAZolin (ANCEF) IVPB 2g/100 mL premix        2 g 200 mL/hr over 30 Minutes Intravenous To Radiology 11/16/19 1511 11/17/19 1625   11/15/19 0900  sulfamethoxazole-trimethoprim (BACTRIM DS) 800-160 MG per tablet 1 tablet        1 tablet Per Tube Once per day on Mon Wed Fri 11/13/19 1906     11/13/19 1615  dolutegravir (TIVICAY) tablet 50 mg  Status:  Discontinued        50 mg Oral Daily 11/13/19 1521 12/12/19 1413   11/13/19 1615  emtricitabine-tenofovir AF (DESCOVY) 200-25 MG per  tablet 1 tablet  Status:  Discontinued        1 tablet Per Tube Daily 11/13/19 1521 12/12/19 1413   11/13/19 1530  sulfamethoxazole-trimethoprim (BACTRIM DS) 800-160 MG per tablet 1 tablet  Status:  Discontinued        1 tablet Oral Once per day on Mon Wed Fri  11/13/19 1441 11/13/19 1906   11/13/19 1500  bictegravir-emtricitabine-tenofovir AF (BIKTARVY) 50-200-25 MG per tablet 1 tablet  Status:  Discontinued        1 tablet Oral Daily 11/13/19 1403 11/13/19 1521   11/10/19 1000  erythromycin 250 mg in sodium chloride 0.9 % 100 mL IVPB        250 mg 100 mL/hr over 60 Minutes Intravenous Every 8 hours 11/10/19 0907 11/11/19 2000   11/07/19 1200  ceFEPIme (MAXIPIME) 2 g in sodium chloride 0.9 % 100 mL IVPB        2 g 200 mL/hr over 30 Minutes Intravenous Every 8 hours 11/07/19 1013 11/13/19 2127   11/03/19 1200  cefTRIAXone (ROCEPHIN) 2 g in sodium chloride 0.9 % 100 mL IVPB        2 g 200 mL/hr over 30 Minutes Intravenous Every 24 hours 11/03/19 0922 11/05/19 1427   11/02/19 0800  vancomycin (VANCOREADY) IVPB 750 mg/150 mL  Status:  Discontinued        750 mg 150 mL/hr over 60 Minutes Intravenous Every 12 hours 11/01/19 1935 11/02/19 1105   11/02/19 0600  piperacillin-tazobactam (ZOSYN) IVPB 3.375 g  Status:  Discontinued        3.375 g 12.5 mL/hr over 240 Minutes Intravenous Every 8 hours 11/02/19 0311 11/03/19 0920   11/01/19 2200  ceFEPIme (MAXIPIME) 2 g in sodium chloride 0.9 % 100 mL IVPB  Status:  Discontinued        2 g 200 mL/hr over 30 Minutes Intravenous Every 12 hours 11/01/19 2143 11/02/19 0311   11/01/19 1915  vancomycin (VANCOREADY) IVPB 1500 mg/300 mL        1,500 mg 150 mL/hr over 120 Minutes Intravenous  Once 11/01/19 1909 11/01/19 2147   11/01/19 1845  cefTRIAXone (ROCEPHIN) 1 g in sodium chloride 0.9 % 100 mL IVPB        1 g 200 mL/hr over 30 Minutes Intravenous  Once 11/01/19 1842 11/01/19 2051   11/01/19 1845  azithromycin (ZITHROMAX) 500 mg in sodium chloride 0.9 % 250 mL IVPB        500 mg 250 mL/hr over 60 Minutes Intravenous  Once 11/01/19 1842 11/01/19 2051       Time spent: 20 minutes    Erin Hearing ANP  [facility]  7 am-330 pm/M-F for direct patient care and secure chat Please refer to [place] for  contact information 03/14/2020, 7:41 AM  LOS: 134 days

## 2020-03-14 NOTE — TOC Progression Note (Signed)
Transition of Care [facility]) - Progression Note    Patient Details  Name: Ralf Konopka MRN: [id number] Date of Birth: 1965-10-14  Transition of Care [facility]) CM/SW Contact  Janae Bridgeman, RN Phone Number: 03/14/2020, 1:34 PM  Clinical Narrative:    Case management called and left a message with Isabelle Course, CM at [facility] facility in [place], [place] to inquire about bed availability at the facility next week in regards to admission for this patient.  My contact information was left with Isabelle Course, CM on her voicemail and I will follow up with facility for admission.  [facility] in [place], [place] does not have admission capability this week due to COVID outbreak but I will follow up with the case manager.   Expected Discharge Plan: Skilled Nursing Facility Barriers to Discharge: Continued Medical Work up,SNF Pending bed offer  Expected Discharge Plan and Services Expected Discharge Plan: Skilled Nursing Facility   Discharge Planning Services: CM Consult Post Acute Care Choice: Nursing Home Living arrangements for the past 2 months: Single Family Home                                       Social Determinants of Health (SDOH) Interventions    Readmission Risk Interventions Readmission Risk Prevention Plan 02/06/2020  Transportation Screening Complete  PCP or Specialist Appt within 3-5 Days Complete  HRI or Home Care Consult Complete  Social Work Consult for Recovery Care Planning/Counseling Complete  Palliative Care Screening Complete  Medication Review [facility]) Complete  Some recent data might be hidden

## 2020-03-15 DIAGNOSIS — L89892 Pressure ulcer of other site, stage 2: Secondary | ICD-10-CM

## 2020-03-15 LAB — CBC WITH DIFFERENTIAL/PLATELET
Abs Immature Granulocytes: 0.01 10*3/uL (ref 0.00–0.07)
Basophils Absolute: 0 10*3/uL (ref 0.0–0.1)
Basophils Relative: 0 %
Eosinophils Absolute: 0.1 10*3/uL (ref 0.0–0.5)
Eosinophils Relative: 3 %
HCT: 45.5 % (ref 39.0–52.0)
Hemoglobin: 14.1 g/dL (ref 13.0–17.0)
Immature Granulocytes: 0 %
Lymphocytes Relative: 16 %
Lymphs Abs: 0.6 10*3/uL — ABNORMAL LOW (ref 0.7–4.0)
MCH: 30 pg (ref 26.0–34.0)
MCHC: 31 g/dL (ref 30.0–36.0)
MCV: 96.8 fL (ref 80.0–100.0)
Monocytes Absolute: 0.5 10*3/uL (ref 0.1–1.0)
Monocytes Relative: 13 %
Neutro Abs: 2.6 10*3/uL (ref 1.7–7.7)
Neutrophils Relative %: 68 %
Platelets: 145 10*3/uL — ABNORMAL LOW (ref 150–400)
RBC: 4.7 MIL/uL (ref 4.22–5.81)
RDW: 12.8 % (ref 11.5–15.5)
WBC: 3.8 10*3/uL — ABNORMAL LOW (ref 4.0–10.5)
nRBC: 0 % (ref 0.0–0.2)

## 2020-03-15 LAB — MAGNESIUM: Magnesium: 1.9 mg/dL (ref 1.7–2.4)

## 2020-03-15 LAB — COMPREHENSIVE METABOLIC PANEL
ALT: 20 U/L (ref 0–44)
AST: 26 U/L (ref 15–41)
Albumin: 3.7 g/dL (ref 3.5–5.0)
Alkaline Phosphatase: 96 U/L (ref 38–126)
Anion gap: 12 (ref 5–15)
BUN: 20 mg/dL (ref 6–20)
CO2: 25 mmol/L (ref 22–32)
Calcium: 10.6 mg/dL — ABNORMAL HIGH (ref 8.9–10.3)
Chloride: 106 mmol/L (ref 98–111)
Creatinine, Ser: 0.65 mg/dL (ref 0.61–1.24)
GFR, Estimated: >60 mL/min (ref >60)
Glucose, Bld: 112 mg/dL — ABNORMAL HIGH (ref 70–99)
Potassium: 3.9 mmol/L (ref 3.5–5.1)
Sodium: 143 mmol/L (ref 135–145)
Total Bilirubin: 0.6 mg/dL (ref 0.3–1.2)
Total Protein: 8.4 g/dL — ABNORMAL HIGH (ref 6.5–8.1)

## 2020-03-15 LAB — GLUCOSE, CAPILLARY
Glucose-Capillary: 93 mg/dL (ref 70–99)
Glucose-Capillary: 116 mg/dL — ABNORMAL HIGH (ref 70–99)
Glucose-Capillary: 91 mg/dL (ref 70–99)
Glucose-Capillary: 124 mg/dL — ABNORMAL HIGH (ref 70–99)
Glucose-Capillary: 98 mg/dL (ref 70–99)

## 2020-03-15 LAB — PHOSPHORUS: Phosphorus: 3.8 mg/dL (ref 2.5–4.6)

## 2020-03-15 NOTE — Plan of Care (Signed)
  Problem: Clinical Measurements: Goal: Will remain free from infection Outcome: Progressing Goal: Diagnostic test results will improve Outcome: Progressing Goal: Respiratory complications will improve Outcome: Progressing Goal: Cardiovascular complication will be avoided Outcome: Progressing   Problem: Activity: Goal: Risk for activity intolerance will decrease Outcome: Progressing   Problem: Nutrition: Goal: Adequate nutrition will be maintained Outcome: Progressing   Problem: Coping: Goal: Level of anxiety will decrease Outcome: Progressing   Problem: Elimination: Goal: Will not experience complications related to bowel motility Outcome: Progressing Goal: Will not experience complications related to urinary retention Outcome: Progressing   Problem: Pain Managment: Goal: General experience of comfort will improve Outcome: Progressing   Problem: Safety: Goal: Ability to remain free from injury will improve Outcome: Progressing   Problem: Skin Integrity: Goal: Risk for impaired skin integrity will decrease Outcome: Progressing   Problem: Education: Goal: 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 Outcome: Progressing   Problem: Activity: Goal: Ability to tolerate increased activity will improve Outcome: Progressing   Problem: Health Behavior/Discharge Planning: Goal: 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 Outcome: Progressing   Problem: Respiratory: Goal: Patent airway maintenance will improve Outcome: Progressing   Problem: Role Relationship: Goal: Ability to communicate will improve Outcome: Progressing   Problem: Education: Goal: Knowledge of the prescribed therapeutic regimen Outcome: Progressing Goal: Knowledge of disease or condition will improve Outcome: Progressing   Problem: Clinical Measurements: Goal: Neurologic status will improve Outcome: Progressing   Problem: Tissue Perfusion: Goal: Ability to maintain  intracranial pressure will improve Outcome: Progressing   Problem: Respiratory: Goal: Will regain and/or maintain adequate ventilation Outcome: Progressing   Problem: Skin Integrity: Goal: Risk for impaired skin integrity will decrease Outcome: Progressing Goal: Demonstration of wound healing without infection will improve Outcome: Progressing   Problem: Psychosocial: Goal: Ability to verbalize positive feelings about self will improve Outcome: Progressing Goal: Ability to participate in self-care as condition permits will improve Outcome: Progressing Goal: Ability to identify appropriate support needs will improve Outcome: Progressing   Problem: Health Behavior/Discharge Planning: Goal: Ability to manage health-related needs will improve Outcome: Progressing   Problem: Nutritional: Goal: Risk of aspiration will decrease Outcome: Progressing Goal: Dietary intake will improve Outcome: Progressing   Problem: Communication: Goal: Ability to communicate needs accurately will improve Outcome: Progressing   

## 2020-03-15 NOTE — Plan of Care (Signed)
  Problem: Clinical Measurements: Goal: Will remain free from infection Outcome: Not Progressing Goal: Diagnostic test results will improve Outcome: Not Progressing Goal: Respiratory complications will improve Outcome: Not Progressing Goal: Cardiovascular complication will be avoided Outcome: Not Progressing   Problem: Activity: Goal: Risk for activity intolerance will decrease Outcome: Not Progressing   Problem: Nutrition: Goal: Adequate nutrition will be maintained Outcome: Not Progressing   Problem: Coping: Goal: Level of anxiety will decrease Outcome: Not Progressing

## 2020-03-15 NOTE — Consult Note (Signed)
WOC Nurse Consult Note: Patient receiving care in [facility] 2W21 Reason for Consult: Blister to left foot Wound type: Ruptured bullae on the left foot. Pressure Injury POA: NA Measurement: Deferred Wound bed: Pink with skin flap Drainage (amount, consistency, odor) None Periwound: Intact Dressing procedure/placement/frequency: Gently pull skin back in place. Apply Xeroform gauze to the open wound, cover with foam dressing. Change Xeroform daily. Foam dressing may be changed every 3 days or PRN soiling.  Monitor the wound area(s) for worsening of condition such as: Signs/symptoms of infection, increase in size, development of or worsening of odor, development of pain, or increased pain at the affected locations.   Notify the medical team if any of these develop.  Thank you for the consult. WOC nurse will not follow at this time.   Please re-consult the WOC team if needed.  Renaldo Reel Katrinka Blazing, MSN, RN, CMSRN, Angus Seller, [facility] Wound Treatment Associate Pager [phone]

## 2020-03-15 NOTE — Progress Notes (Addendum)
[facility] PROGRESS NOTE  Ryan Orozco EPP:[id number] DOB: 10-Feb-1965 DOA: 11/01/2019 PCP: Default, Provider, MD       12/19/19                          03/15/20   Status: Remains inpatient appropriate because:Altered mental status, Unsafe d/c plan and Inpatient level of care appropriate due to severity of illness. Patient newly diagnosed with anoxic brain injury  Dispo: The patient is from: Home              Anticipated d/c is to: SNF              Anticipated d/c date is: > 3 days              Patient currently is medically stable to d/c.  Barriers to discharge: No SNF bed offers.  Needs trach capable facility.  Still awaiting approval of Medicaid and disability.  No bed offers.   Code Status: DNR after d/w palliative team on 1/27 Family Communication: 2/11 updated dtr [name] via text to include picture of her father up in chair-she will be in [place] on 2/21 DVT prophylaxis: Subcutaneous heparin Vaccination status: Will order first dose Pfizer COVID-vaccine on 1/10; next week we will order pneumococcal and influenza vaccinations-2nd dose ordered 277  Foley catheter:  No  HPI: 55 year old male with HIV, chronic alcohol abuse who was presented to the emergency department after being found unresponsive outside a convinient store.  EMS found him pulseless in PEA, unknown downtime, successfully resuscitated and brought to the emergency department.  UDS positive for THC and benzodiazepines.  CT head unremarkable.  Failed extubation trials due to severe anoxic brain injury and had tracheostomy placed.  PEG placed on 10/15.  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  This has led to significant pain as well.  [name] was consulted and recommendations/input/orders highly appreciated.  She may also benefit from Botox injections to both hips to aid in treatment of spasticity.   Significant Hospital  Events   9/29 Admit post PEA arrest. UDS positive for THC, benzo's. ETOH 177 10/05 EEG ongoing. Versed restarted overnight due to agitation. Nicardipine increased.  10/06 Developed tachypnea with WUA, no follow commands off sedation  10/07 Vomiting, TF held / restarted with recurrent vomiting 10/16 tracheostomy collar for 9 hours 10/18->10/19 off vent all night  10/20> TC 11/15 > downsized to #4 Shiley flex CFS  Subjective: Awake and at baseline level of mentation  Objective: Vitals:   03/14/20 2132 03/15/20 0606  BP: (!) 119/95 (!) 121/102  Pulse: 87 82  Resp:  18  Temp:  98.1 F (36.7 C)  SpO2: 97% 94%    Intake/Output Summary (Last 24 hours) at 03/15/2020 0824 Last data filed at 03/15/2020 6063 Gross per 24 hour  Intake 600 ml  Output 2100 ml  Net -1500 ml   Filed Weights   03/06/20 0500 03/14/20 0740 03/15/20 0742  Weight: 72 kg 74 kg 74 kg    Exam: General: Awake alert and smiling Pulmonary: Lung sounds clear, no increased work of breathing.  FiO2 21% trach tube with trach collar without secretions Cardiac: Tachycardia, heart sounds normal, extremities warm with good capillary refill and no edema Abdomen: PEG tube feedings and medications.  LBM 2/9 Neurological: Severe hypertonicity/spasticity bilateral lower extremities involving the hip flexors and hamstrings; primarily keeps bilateral hips flexed -continues to have pain w/ attempts  to extend either leg especially the right leg.  Nonpurposeful use of upper extremities.   Psychiatric: Awake, given incomprehensible speech unable to assess orientation.  Pleasant affect.  Assessment/Plan: Acute problems: Anoxic brain injury 2/2 PEA arrest/pain syndrome secondary to hypertonicity/spastic tetraplegia:  -Presumed 2/2 combination alcohol and BZD overdose with UDS positive for THC and BZDs -Significant brain injury/spastic tetraplegia -needs long-term SNF -Continue baclofen, gabapentin, scheduled oxycodone and  Zanaflex -Appreciate assistance of [name]; 1/6 Botox injection into the quadriceps and rectus femoris as well as the biceps femoris and hamstrings, semimembranosus and semitendinosus.  Unable to inject iliopsoas without EMG guidance w/o significant improvement and spasticity hypertonicity. -Continue bilateral UE WHO resting splints/PROM q 4 hours/KREG bed -Continue Seroquel  -Trial out of bed to chair with lift today  Low-grade fever/abnormal urinalysis -2/8: Solitary T-max 100.5 overnight.  Likely related to atelectasis.  Obtain chest x-ray today that was unremarkable -As a precaution we will go ahead and obtain urinalysis and culture and blood cultures; electrolytes unremarkable, LDH normal at 178, no leukocytosis -Urinalysis abnormal with hazy appearance, trace leukocytes, rare bacteria and 11-20 WBCs-urine culture pending -Blood cultures pending and currently without any obvious growth  Blister dorsum left foot  03/15/20 -Possibly pressure related -We will ask wound care to evaluate  Goals of care:  -Palliative care team reconsulted on 1/27 and after an extensive discussion daughter does realize that patient quite debilitated and would not survive a resuscitation therefore patient made a DNR  Chronic respiratory failure with inability to maintain patent airway requiring chronic tracheostomy tube/Recent Corynebacterium tracheobronchitis (resolved) -Continue #4 cuffless trach in place-trach changed out on 1/10 RT -Given altered mentation and inability to manage secretions PCCM states patient is not a candidate for elective decannulation  Fever/peri-PEG drainage positive for corynebacterium and rare Pseudomonas -PEG site with some drainage but no erythema, induration or pain -Suspect may have colonization noting recently had corynebacterium tracheobronchitis treated -continue bacitracin ointment  HIV:  -CD4 count of 124 October 2021 (had been as high as 250 Dec 2020)-as of today up  to 259; viral load obtained on 1/15 was <20 -Dc'd Tivicay and Descovy infavor of Biktarvy to for eventual dc to SNF; per my discussion with infectious disease physician [name] on 12/23 preferred regimen would be Descovy and Tivicay-she will contact the HIV pharmacist to attempt to obtain discount cards to make this a more affordable option.  Once this has been confirmed will transition back to Descovy and Tivicay -CD4 count on 12/23 up to 259 -ID recommends continuing prophylactic Bactrim an additional 3 months  Dysphagia 2/2 anoxic brain injury -Continue tube feeding per PEG -Given altered mentation patient not a candidate for further speech evaluations and will be permanently dependent on enteral tube feedings  Constipation -We will continue scheduled Colace, fiber supplements per tube as well as daily MiraLAX -Suspect requirement for narcotics and multiple muscle relaxers contributing to decreased GI motility and constipation symptoms  Protein calorie malnutrition nutrition Status: Nutrition Problem: Increased nutrient needs Etiology: acute illness Signs/Symptoms: estimated needs Interventions: Tube feeding bolus doses of Osmolite, Juven twice daily, Prosource TF 45 mL 3 times daily Estimated body mass index is 26.33 kg/m as calculated from the following:   Height as of this encounter: 5' 6"  (1.676 m).   Weight as of this encounter: 74 kg.   Multiple decubiti not POA Wound / Incision (Open or Dehisced) 11/17/19 Puncture Abdomen Left;Anterior;Upper G-tube insertion site  (Active)  Date First Assessed/Time First Assessed: 11/17/19 1646  Wound Type: Puncture  Location: Abdomen  Location Orientation: Left;Anterior;Upper  Wound Description (Comments): G-tube insertion site   Present on Admission: No    Assessments 11/17/2019  4:47 PM 03/14/2020  8:50 AM  Dressing Type Gauze (Comment);Tape dressing Gauze (Comment)  Dressing Changed New -  Dressing Status Clean;Dry;Intact  Clean;Dry;Intact  Dressing Change Frequency PRN Daily  Site / Wound Assessment Clean;Dry;Pink Clean;Dry  Peri-wound Assessment Intact -  Closure None -  Drainage Amount None -  Treatment Cleansed Cleansed     No Linked orders to display      Other problems: Hypertension/grade 1 diastolic dysfunction:  -Continue amlodipine  -Echocardiogram this admission with preserved LVEF with evidence of grade   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   -Continue Keppra; levetiracetam level 35.9 on 11/12 -no further seizure activity since transitioned out of ICU setting therefore will discontinue seizure precaution  SIRS/Fever 2/2 recurrent enterococcus UTI -Patient developed mild elevation in temperature overnight to 1/13 associated with slight increase in respiratory secretions and congested sounding cough/abnormal pulmonary exam as well as tachycardia./14 respiratory status much improved and no further fevers. -Respiratory viral panel neg for COVID, influenza and RSV.   -UA slightly abnormal/cx c/w enterococcus UTI- started on nitrofurantoin (prior UTI treated w/ augmentin) has completed 3 days of treatment -X-ray with bilateral opacities concerning for early pneumonia but CT of the chest without pneumonia, broad-spectrum antibiotics dc'd 1/14 -Because of dehydration increased frequency of free water from 200 cc every 4 hours to every 2 hours on 1/13- stopped on 1/15  Inguinal hernias -Evaluated by general surgery this admission. Documented as chronically incarcerated. Patient has been clinically stable and tolerating tube feedings and having bowel movements so unless patient obstructed would not be considered a candidate for repair. Even if obstructed consulting surgeon was not sure he would be a candidate for hernia repair regardless  Constipation -No documented bowel movement since 1/13 -We will continue scheduled Colace, fiber supplements per tube -Add daily  MiraLAX -Given one-time dose milk of magnesia on 1/20-no documented bowel movement on flowsheet -Suspect requirement for narcotics and multiple muscle relaxers contributing to decreased GI motility and constipation symptoms  Goals of care:  -Palliative care was involved during this hospitalization.   -Goals of care were discussed. remains full code.  -Palliative care recommended outpatient follow-up with palliative providers. -Reconsult Palliative medicine 1/27   Data Reviewed: Basic Metabolic Panel: Recent Labs  Lab 03/12/20 0919  NA 144  K 3.9  CL 106  CO2 26  GLUCOSE 96  BUN 16  CREATININE 0.70  CALCIUM 10.9*   Liver Function Tests: Recent Labs  Lab 03/12/20 0919  AST 25  ALT 18  ALKPHOS 93  BILITOT 0.5  PROT 8.0  ALBUMIN 3.5   No results for input(s): LIPASE, AMYLASE in the last 168 hours. No results for input(s): AMMONIA in the last 168 hours. CBC: Recent Labs  Lab 03/12/20 0919  WBC 4.6  NEUTROABS 3.3  HGB 14.2  HCT 42.9  MCV 94.1  PLT 179   Cardiac Enzymes: No results for input(s): CKTOTAL, CKMB, CKMBINDEX, TROPONINI in the last 168 hours. BNP (last 3 results) Recent Labs    02/15/20 0433  BNP 3.9    ProBNP (last 3 results) No results for input(s): PROBNP in the last 8760 hours.  CBG: Recent Labs  Lab 03/14/20 0732 03/14/20 1159 03/14/20 1549 03/14/20 1933 03/14/20 2212  GLUCAP 95 114* 115* 162* 119*    Recent Results (from the past 240  hour(s))  Culture, blood (Routine X 2) w Reflex to ID Panel     Status: None (Preliminary result)   Collection Time: 03/12/20  9:19 AM   Specimen: BLOOD LEFT FOREARM  Result Value Ref Range Status   Specimen Description BLOOD LEFT FOREARM  Final   Special Requests   Final    BOTTLES DRAWN AEROBIC AND ANAEROBIC Blood Culture results may not be optimal due to an inadequate volume of blood received in culture bottles   Culture   Final    NO GROWTH 3 DAYS Performed at [facility], [place]  [address], [place], [place] [address]    Report Status PENDING  Incomplete  Culture, blood (Routine X 2) w Reflex to ID Panel     Status: None (Preliminary result)   Collection Time: 03/12/20  9:22 AM   Specimen: BLOOD  Result Value Ref Range Status   Specimen Description BLOOD LEFT ANTECUBITAL  Final   Special Requests   Final    BOTTLES DRAWN AEROBIC AND ANAEROBIC Blood Culture results may not be optimal due to an inadequate volume of blood received in culture bottles   Culture   Final    NO GROWTH 3 DAYS Performed at [facility], [place] [address] [place], [place] [address]    Report Status PENDING  Incomplete     Studies: No results found.  Scheduled Meds: . acetaminophen (TYLENOL) oral liquid 160 mg/5 mL  650 mg Per Tube Q6H  . amLODipine  10 mg Per Tube Daily  . bacitracin   Topical BID  . baclofen  30 mg Per Tube TID  . chlorhexidine gluconate (MEDLINE KIT)  15 mL Mouth Rinse BID  . Darunavir-Cobicisctat-Emtricitabine-Tenofovir Alafenamide  1 tablet Oral Q breakfast  . famotidine  10.4 mg Per Tube BID  . feeding supplement (OSMOLITE 1.5 CAL)  237 mL Per Tube Q24H  . feeding supplement (OSMOLITE 1.5 CAL)  474 mL Per Tube TID  . feeding supplement (PROSource TF)  45 mL Per Tube TID  . fiber  1 packet Per Tube BID  . folic acid  1 mg Per Tube Daily  . free water  200 mL Per Tube Q4H  . gabapentin  200 mg Per Tube Q8H  . heparin injection (subcutaneous)  5,000 Units Subcutaneous Q8H  . levETIRAcetam  1,500 mg Per Tube BID  . magic mouthwash w/lidocaine  5 mL Oral QID  . Muscle Rub   Topical QID  . oxyCODONE  5 mg Per Tube Q6H  . polyethylene glycol  17 g Per Tube QHS  . QUEtiapine  12.5 mg Per Tube QHS  . scopolamine  1 patch Transdermal Q72H  . sulfamethoxazole-trimethoprim  1 tablet Per Tube Once per day on Mon Wed Fri  . thiamine  100 mg Per Tube Daily  . tiZANidine  6 mg Per Tube Q6H   Continuous Infusions:   Active Problems:   Cardiac arrest [facility])   Community  acquired pneumonia of left upper lobe of lung   Anoxic brain injury ([place])   Alcohol abuse   Acute respiratory failure with hypoxia (HCC)   Vomiting   Pressure injury of skin   Small bowel obstruction (HCC)   Palliative care encounter   Bowel obstruction (HCC)   Chronic respiratory failure (HCC)   Diastolic dysfunction   Tracheostomy dependent (HCC)   Dysphagia   Protein-calorie malnutrition (HCC)   Seizures ([place])   Bilateral recurrent inguinal hernia   Muscle spasticity  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HCC)   Tracheobronchitis   Sequela of Corynebacterium infection   Abnormal urine   Urinary tract infection due to Enterococcus   Fever   Consultants:  PCCM  Palliative medicine  General surgery  Neurology  Interventional radiology  Procedures:  EEG  Echocardiogram  Tracheostomy  Antibiotics: Anti-infectives (From admission, onward)   Start     Dose/Rate Route Frequency Ordered Stop   02/17/20 1100  nitrofurantoin (FURADANTIN) 25 MG/5ML suspension 100 mg        100 mg Per Tube Every 12 hours 02/17/20 1006 02/20/20 0924   02/15/20 1000  vancomycin (VANCOCIN) IVPB 1000 mg/200 mL premix  Status:  Discontinued        1,000 mg 200 mL/hr over 60 Minutes Intravenous Every 12 hours 02/14/20 2226 02/16/20 0742   02/14/20 2245  piperacillin-tazobactam (ZOSYN) IVPB 3.375 g  Status:  Discontinued        3.375 g 12.5 mL/hr over 240 Minutes Intravenous Every 8 hours 02/14/20 2226 02/16/20 0742   02/14/20 2245  vancomycin (VANCOREADY) IVPB 1500 mg/300 mL        1,500 mg 150 mL/hr over 120 Minutes Intravenous  Once 02/14/20 2226 02/15/20 0739   02/01/20 2200  amoxicillin-clavulanate (AUGMENTIN) 250-62.5 MG/5ML suspension 500 mg        500 mg Per Tube Every 8 hours 02/01/20 1343 02/06/20 1333   01/31/20 1015  levofloxacin (LEVAQUIN) 25 MG/ML solution 500 mg  Status:  Discontinued        500 mg Per Tube Daily 01/31/20 0919 02/01/20 1343   01/31/20 1000   cefTRIAXone (ROCEPHIN) 1 g in sodium chloride 0.9 % 100 mL IVPB  Status:  Discontinued        1 g 200 mL/hr over 30 Minutes Intravenous Every 24 hours 01/31/20 0905 01/31/20 0919   01/19/20 2200  linezolid (ZYVOX) tablet 600 mg        600 mg Per Tube Every 12 hours 01/19/20 1534 01/25/20 2019   01/19/20 1100  levofloxacin (LEVAQUIN) tablet 500 mg  Status:  Discontinued        500 mg Per Tube Daily 01/19/20 1004 01/19/20 1534   12/19/19 0930  Darunavir-Cobicisctat-Emtricitabine-Tenofovir Alafenamide (SYMTUZA) 800-150-200-10 MG TABS 1 tablet        1 tablet Oral Daily with breakfast 12/19/19 0840     12/13/19 1000  bictegravir-emtricitabine-tenofovir AF (BIKTARVY) 50-200-25 MG per tablet 1 tablet  Status:  Discontinued        1 tablet Oral Daily 12/12/19 1413 12/19/19 0840   11/17/19 1548  ceFAZolin (ANCEF) 2-4 GM/100ML-% IVPB       Note to Pharmacy: Domenick Bookbinder   : cabinet override      11/17/19 1548 11/18/19 0359   11/16/19 1515  ceFAZolin (ANCEF) IVPB 2g/100 mL premix        2 g 200 mL/hr over 30 Minutes Intravenous To Radiology 11/16/19 1511 11/17/19 1625   11/15/19 0900  sulfamethoxazole-trimethoprim (BACTRIM DS) 800-160 MG per tablet 1 tablet        1 tablet Per Tube Once per day on Mon Wed Fri 11/13/19 1906     11/13/19 1615  dolutegravir (TIVICAY) tablet 50 mg  Status:  Discontinued        50 mg Oral Daily 11/13/19 1521 12/12/19 1413   11/13/19 1615  emtricitabine-tenofovir AF (DESCOVY) 200-25 MG per tablet 1 tablet  Status:  Discontinued        1 tablet Per Tube Daily 11/13/19 1521 12/12/19  1413   11/13/19 1530  sulfamethoxazole-trimethoprim (BACTRIM DS) 800-160 MG per tablet 1 tablet  Status:  Discontinued        1 tablet Oral Once per day on Mon Wed Fri 11/13/19 1441 11/13/19 1906   11/13/19 1500  bictegravir-emtricitabine-tenofovir AF (BIKTARVY) 50-200-25 MG per tablet 1 tablet  Status:  Discontinued        1 tablet Oral Daily 11/13/19 1403 11/13/19 1521   11/10/19 1000   erythromycin 250 mg in sodium chloride 0.9 % 100 mL IVPB        250 mg 100 mL/hr over 60 Minutes Intravenous Every 8 hours 11/10/19 0907 11/11/19 2000   11/07/19 1200  ceFEPIme (MAXIPIME) 2 g in sodium chloride 0.9 % 100 mL IVPB        2 g 200 mL/hr over 30 Minutes Intravenous Every 8 hours 11/07/19 1013 11/13/19 2127   11/03/19 1200  cefTRIAXone (ROCEPHIN) 2 g in sodium chloride 0.9 % 100 mL IVPB        2 g 200 mL/hr over 30 Minutes Intravenous Every 24 hours 11/03/19 0922 11/05/19 1427   11/02/19 0800  vancomycin (VANCOREADY) IVPB 750 mg/150 mL  Status:  Discontinued        750 mg 150 mL/hr over 60 Minutes Intravenous Every 12 hours 11/01/19 1935 11/02/19 1105   11/02/19 0600  piperacillin-tazobactam (ZOSYN) IVPB 3.375 g  Status:  Discontinued        3.375 g 12.5 mL/hr over 240 Minutes Intravenous Every 8 hours 11/02/19 0311 11/03/19 0920   11/01/19 2200  ceFEPIme (MAXIPIME) 2 g in sodium chloride 0.9 % 100 mL IVPB  Status:  Discontinued        2 g 200 mL/hr over 30 Minutes Intravenous Every 12 hours 11/01/19 2143 11/02/19 0311   11/01/19 1915  vancomycin (VANCOREADY) IVPB 1500 mg/300 mL        1,500 mg 150 mL/hr over 120 Minutes Intravenous  Once 11/01/19 1909 11/01/19 2147   11/01/19 1845  cefTRIAXone (ROCEPHIN) 1 g in sodium chloride 0.9 % 100 mL IVPB        1 g 200 mL/hr over 30 Minutes Intravenous  Once 11/01/19 1842 11/01/19 2051   11/01/19 1845  azithromycin (ZITHROMAX) 500 mg in sodium chloride 0.9 % 250 mL IVPB        500 mg 250 mL/hr over 60 Minutes Intravenous  Once 11/01/19 1842 11/01/19 2051       Time spent: 20 minutes    Erin Hearing ANP  [facility]  7 am-330 pm/M-F for direct patient care and secure chat Please refer to Amion for contact information 03/15/2020, 8:24 AM  LOS: 135 days

## 2020-03-16 LAB — GLUCOSE, CAPILLARY: Glucose-Capillary: 100 mg/dL — ABNORMAL HIGH (ref 70–99)

## 2020-03-16 NOTE — Progress Notes (Signed)
PROGRESS NOTE    Ryan Orozco  JDB:[id number] DOB: [birth year] DOA: 11/01/2019 PCP: Default, Provider, MD   Brief Narrative:  The patient is a 55 year old African-American male with past medical history significant for but not limited to HIV with chronic alcohol abuse as well as other comorbidities who presented to the emergency room after he is found unresponsive outside a convenience store. EMS found him pulseless in PEA arrest with an unknown downtime and he was successfully resuscitated and brought to the emergency department. UDS was positive for THC and benzodiazepines. CT of the head was unremarkable. He was intubated and stabilized and has failed extubation trials due to severe anoxic brain injury had a tracheostomy placed. Subsequently he also had a PEG tube placed then. 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 Physiatry has been consulted and recommendations and orders have been placed. Currently he remains inpatient due to his altered mental status, unsafe discharge disposition and severity of illness due to his anoxic brain injury. Current barrier to discharge is that he has no SNF bed offers and needs a trach capable facility as well as still awaiting approval for Medicaid and disability.  Assessment & Plan:   Active Problems:   Cardiac arrest [facility])   Community acquired pneumonia of left upper lobe of lung   Anoxic brain injury ([place])   Alcohol abuse   Acute respiratory failure with hypoxia (HCC)   Vomiting   Pressure injury of skin   Small bowel obstruction (HCC)   Palliative care encounter   Bowel obstruction (HCC)   Chronic respiratory failure (HCC)   Diastolic dysfunction   Tracheostomy dependent (HCC)   Dysphagia   Protein-calorie malnutrition (HCC)   Seizures (HCC)   Bilateral recurrent inguinal hernia   Muscle spasticity   Spastic tetraplegia with rigidity syndrome  (HCC)   Tracheobronchitis   Sequela of Corynebacterium infection   Abnormal urine   Urinary tract infection due to Enterococcus   Fever  Anoxic brain injury 2/2 PEA arrest/pain syndrome secondary to hypertonicity/spastic tetraplegia:  -Presumed 2/2 combination alcohol and BZD overdose with UDS positive for THC and BZDs -Significant brain injury/spastic tetraplegia -needs long-term SNF -Continue baclofen, gabapentin, scheduled oxycodone and Zanaflex -Appreciate assistance of [name]; 1/6 Botox injection into the quadriceps and rectus femoris as well as the biceps femoris and hamstrings, semimembranosus and semitendinosus.  Unable to inject iliopsoas without EMG guidance w/o significant improvement and spasticity hypertonicity. -Continue bilateral UE WHO resting splints/PROM q 4 hours/KREG bed -Continue Seroquel  -Trial out of bed to chair with lift   Low-grade fever/abnormal urinalysis -2/8: Solitary T-max 100.5 overnight.  Likely related to atelectasis.  Obtain chest x-ray that was unremarkable -As a precaution we will go ahead and obtain urinalysis and culture and blood cultures; electrolytes unremarkable, LDH normal at 178, no leukocytosis -Urinalysis abnormal with hazy appearance, trace leukocytes, rare bacteria and 11-20 WBCs-urine culture pending -Blood cultures pending and currently without any obvious growth at 3 Days   Blister dorsum left foot  03/15/20 -Possibly pressure related -We will ask wound care to evaluate; [place] nurse evaluated and recommending: "Gently pull skin back in place. Apply Xeroform gauze to the open wound, cover with foam dressing. Change Xeroform daily. Foam dressing may be changed every 3 days or PRN soiling."  Chronic respiratory failure with inability to maintain patent airway requiring chronic tracheostomy tube/Recent Corynebacterium tracheobronchitis (resolved) -Continue #4 cuffless trach in place-trach changed out on 1/10  RT -Given altered  mentation and inability to manage secretions PCCM states patient is not a candidate for elective decannulation  Recent Fever/peri-PEG drainage positive for corynebacterium and rare Pseudomonas -PEG site with some drainage but no erythema, induration or pain -Suspect may have colonization noting recently had corynebacterium tracheobronchitis treated -continue bacitracin ointment  HIV: -CD4 count of 124 October 2021 (had been as high as 250 Dec 2020)-as of today up to 259; viral load obtained on 1/15 was <20 -Dc'd Tivicay and Descovy infavor of Biktarvy to for eventual dc to SNF; per my discussion with infectious disease physician [name] on 12/23 preferred regimen would be Descovy and Tivicay-she will contact the HIV pharmacist to attempt to obtain discount cards to make this a more affordable option.  Once this has been confirmed will transition back to Descovy and Tivicay -CD4 count on 12/23 up to 259 -ID recommends continuing prophylactic Bactrim an additional 3 months  Dysphagia 2/2 anoxic brain injury -Continue tube feeding per PEG -Given altered mentation patient not a candidate for further speech evaluations and will be permanently dependent on enteral tube feedings  Protein calorie malnutrition nutrition Status: Nutrition Problem: Increased nutrient needs Etiology: acute illness Signs/Symptoms: estimated needs Interventions: Tube feeding bolus doses of Osmolite, Juven twice daily, Prosource TF 45 mL 3 times daily Estimated body mass index is 26.33 kg/m as calculated from the following:   Height as of this encounter: 5' 6"  (1.676 m).   Weight as of this encounter: 74 kg.   Multiple decubiti not POA     Wound / Incision (Open or Dehisced) 11/17/19 Puncture Abdomen Left;Anterior;Upper G-tube insertion site  (Active)  Date First Assessed/Time First Assessed: 11/17/19 1646   Wound Type: Puncture  Location: Abdomen  Location Orientation: Left;Anterior;Upper  Wound Description  (Comments): G-tube insertion site   Present on Admission: No    Assessments 11/17/2019  4:47 PM 03/14/2020  8:50 AM  Dressing Type Gauze (Comment);Tape dressing Gauze (Comment)  Dressing Changed New --  Dressing Status Clean;Dry;Intact Clean;Dry;Intact  Dressing Change Frequency PRN Daily  Site / Wound Assessment Clean;Dry;Pink Clean;Dry  Peri-wound Assessment Intact --  Closure None --  Drainage Amount None --  Treatment Cleansed Cleansed     No Linked orders to display   Hypertension/grade 1 diastolic dysfunction:  -Continue Amlodipine 10 mg per Tub Daily  -Echocardiogram this admission with preserved LVEF with evidence of grade   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  -Continue Keppra; levetiracetam level 35.9 on 11/12 -no further seizure activity since transitioned out of ICU setting therefore will discontinue seizure precaution  SIRS/Fever 2/2 recurrent enterococcus UTI -Patient developed mild elevation in temperature overnight to 1/13 associated with slight increase in respiratory secretions and congested sounding cough/abnormal pulmonary exam as well as tachycardia./14 respiratory status much improved and no further fevers. -Respiratory viral panel neg for COVID, influenza and RSV.   -UA slightly abnormal/cx c/w enterococcus UTI- started on nitrofurantoin (prior UTI treated w/ augmentin) has completed 3 days of treatment -X-ray with bilateral opacities concerning for early pneumonia but CT of the chest without pneumonia, broad-spectrum antibiotics dc'd 1/14 -Because of dehydration increased frequency of free water from 200 cc every 4 hours to every 2 hours on 1/13- stopped on 1/15  Inguinal hernias -Evaluated by general surgery this admission. Documented as chronically incarcerated. Patient has been clinically stable and tolerating tube feedings and having bowel movements so unless patient obstructed would not be considered a candidate for repair. Even  if obstructed consulting  surgeon was not sure he would be a candidate for hernia repair regardless  Constipation -We will continue scheduled Colace, fiber supplements per tube as well as daily MiraLAX -Suspect requirement for narcotics and multiple muscle relaxers contributing to decreased GI motility and constipation symptoms  Goals of care:  -Palliative care was involved during this hospitalization.  -Goals of care were discussed. remains full code.  -Palliative care recommended outpatient follow-up with palliative providers. -Reconsult Palliative medicine 1/27 and after an extensive discussion daughter does realize that patient quite debilitated and would not survive a resuscitation therefore patient made a DNR  DVT prophylaxis: Heparin 5,000 units sq q8h Code Status: DO NOT RESUSCITATE  Family Communication: No family present at bedside  Disposition Plan: SNF when bed available   Status is: Inpatient  Remains inpatient appropriate because:Unsafe d/c plan, IV treatments appropriate due to intensity of illness or inability to take PO and Inpatient level of care appropriate due to severity of illness   Dispo: The patient is from: Home              Anticipated d/c is to: Home              Anticipated d/c date is: TBD              Patient currently is medically stable to d/c.   Difficult to place patient Yes  Consultants:   PCCM  Neurology  Palliative Care Medicine  IR  Procedures/Signficiant Events: 9/29 Admit post PEA arrest. UDS positive for THC, benzo's. ETOH 177 10/05 EEG ongoing. Versed restarted overnight due to agitation. Nicardipine increased.  10/06 Developed tachypnea with WUA, no follow commands off sedation  10/07 Vomiting, TF held / restarted with recurrent vomiting 10/16 tracheostomy collar for 9 hours 10/18->10/19 off vent all night  10/20> TC 11/15 > downsized to #4 Shiley flex CFS  Antimicrobials:  Anti-infectives (From admission, onward)    Start     Dose/Rate Route Frequency Ordered Stop   02/17/20 1100  nitrofurantoin (FURADANTIN) 25 MG/5ML suspension 100 mg        100 mg Per Tube Every 12 hours 02/17/20 1006 02/20/20 0924   02/15/20 1000  vancomycin (VANCOCIN) IVPB 1000 mg/200 mL premix  Status:  Discontinued        1,000 mg 200 mL/hr over 60 Minutes Intravenous Every 12 hours 02/14/20 2226 02/16/20 0742   02/14/20 2245  piperacillin-tazobactam (ZOSYN) IVPB 3.375 g  Status:  Discontinued        3.375 g 12.5 mL/hr over 240 Minutes Intravenous Every 8 hours 02/14/20 2226 02/16/20 0742   02/14/20 2245  vancomycin (VANCOREADY) IVPB 1500 mg/300 mL        1,500 mg 150 mL/hr over 120 Minutes Intravenous  Once 02/14/20 2226 02/15/20 0739   02/01/20 2200  amoxicillin-clavulanate (AUGMENTIN) 250-62.5 MG/5ML suspension 500 mg        500 mg Per Tube Every 8 hours 02/01/20 1343 02/06/20 1333   01/31/20 1015  levofloxacin (LEVAQUIN) 25 MG/ML solution 500 mg  Status:  Discontinued        500 mg Per Tube Daily 01/31/20 0919 02/01/20 1343   01/31/20 1000  cefTRIAXone (ROCEPHIN) 1 g in sodium chloride 0.9 % 100 mL IVPB  Status:  Discontinued        1 g 200 mL/hr over 30 Minutes Intravenous Every 24 hours 01/31/20 0905 01/31/20 0919   01/19/20 2200  linezolid (ZYVOX) tablet 600 mg        600 mg Per  Tube Every 12 hours 01/19/20 1534 01/25/20 2019   01/19/20 1100  levofloxacin (LEVAQUIN) tablet 500 mg  Status:  Discontinued        500 mg Per Tube Daily 01/19/20 1004 01/19/20 1534   12/19/19 0930  Darunavir-Cobicisctat-Emtricitabine-Tenofovir Alafenamide (SYMTUZA) 800-150-200-10 MG TABS 1 tablet        1 tablet Oral Daily with breakfast 12/19/19 0840     12/13/19 1000  bictegravir-emtricitabine-tenofovir AF (BIKTARVY) 50-200-25 MG per tablet 1 tablet  Status:  Discontinued        1 tablet Oral Daily 12/12/19 1413 12/19/19 0840   11/17/19 1548  ceFAZolin (ANCEF) 2-4 GM/100ML-% IVPB       Note to Pharmacy: Domenick Bookbinder   : cabinet override       11/17/19 1548 11/18/19 0359   11/16/19 1515  ceFAZolin (ANCEF) IVPB 2g/100 mL premix        2 g 200 mL/hr over 30 Minutes Intravenous To Radiology 11/16/19 1511 11/17/19 1625   11/15/19 0900  sulfamethoxazole-trimethoprim (BACTRIM DS) 800-160 MG per tablet 1 tablet        1 tablet Per Tube Once per day on Mon Wed Fri 11/13/19 1906     11/13/19 1615  dolutegravir (TIVICAY) tablet 50 mg  Status:  Discontinued        50 mg Oral Daily 11/13/19 1521 12/12/19 1413   11/13/19 1615  emtricitabine-tenofovir AF (DESCOVY) 200-25 MG per tablet 1 tablet  Status:  Discontinued        1 tablet Per Tube Daily 11/13/19 1521 12/12/19 1413   11/13/19 1530  sulfamethoxazole-trimethoprim (BACTRIM DS) 800-160 MG per tablet 1 tablet  Status:  Discontinued        1 tablet Oral Once per day on Mon Wed Fri 11/13/19 1441 11/13/19 1906   11/13/19 1500  bictegravir-emtricitabine-tenofovir AF (BIKTARVY) 50-200-25 MG per tablet 1 tablet  Status:  Discontinued        1 tablet Oral Daily 11/13/19 1403 11/13/19 1521   11/10/19 1000  erythromycin 250 mg in sodium chloride 0.9 % 100 mL IVPB        250 mg 100 mL/hr over 60 Minutes Intravenous Every 8 hours 11/10/19 0907 11/11/19 2000   11/07/19 1200  ceFEPIme (MAXIPIME) 2 g in sodium chloride 0.9 % 100 mL IVPB        2 g 200 mL/hr over 30 Minutes Intravenous Every 8 hours 11/07/19 1013 11/13/19 2127   11/03/19 1200  cefTRIAXone (ROCEPHIN) 2 g in sodium chloride 0.9 % 100 mL IVPB        2 g 200 mL/hr over 30 Minutes Intravenous Every 24 hours 11/03/19 0922 11/05/19 1427   11/02/19 0800  vancomycin (VANCOREADY) IVPB 750 mg/150 mL  Status:  Discontinued        750 mg 150 mL/hr over 60 Minutes Intravenous Every 12 hours 11/01/19 1935 11/02/19 1105   11/02/19 0600  piperacillin-tazobactam (ZOSYN) IVPB 3.375 g  Status:  Discontinued        3.375 g 12.5 mL/hr over 240 Minutes Intravenous Every 8 hours 11/02/19 0311 11/03/19 0920   11/01/19 2200  ceFEPIme (MAXIPIME) 2 g in  sodium chloride 0.9 % 100 mL IVPB  Status:  Discontinued        2 g 200 mL/hr over 30 Minutes Intravenous Every 12 hours 11/01/19 2143 11/02/19 0311   11/01/19 1915  vancomycin (VANCOREADY) IVPB 1500 mg/300 mL        1,500 mg 150 mL/hr over 120 Minutes Intravenous  Once 11/01/19 1909 11/01/19 2147   11/01/19 1845  cefTRIAXone (ROCEPHIN) 1 g in sodium chloride 0.9 % 100 mL IVPB        1 g 200 mL/hr over 30 Minutes Intravenous  Once 11/01/19 1842 11/01/19 2051   11/01/19 1845  azithromycin (ZITHROMAX) 500 mg in sodium chloride 0.9 % 250 mL IVPB        500 mg 250 mL/hr over 60 Minutes Intravenous  Once 11/01/19 1842 11/01/19 2051       Subjective: Seen and examined bedside and his mentation is at baseline.  Did not appear to be in any acute distress but did not provide a meaningful history.  Getting a bath from the nursing tech.  No other concerns or complaint at this time.  Objective: Vitals:   03/16/20 0805 03/16/20 0830 03/16/20 1113 03/16/20 1510  BP:  126/72    Pulse: 93 79 84 82  Resp: 20 18 18 20   Temp:  98.5 F (36.9 C)    TempSrc:  Axillary    SpO2: 94% 98% 98% 96%  Weight:      Height:        Intake/Output Summary (Last 24 hours) at 03/16/2020 1725 Last data filed at 03/16/2020 0024 Gross per 24 hour  Intake 400 ml  Output 0 ml  Net 400 ml   Filed Weights   03/06/20 0500 03/14/20 0740 03/15/20 0742  Weight: 72 kg 74 kg 74 kg    Examination: Physical Exam:  Constitutional: WN/WD who is awake and in no acute distress and appears calm Respiratory: Diminished to auscultation bilaterally, no wheezing, rales, rhonchi or crackles. Normal respiratory effort and patient is not tachypenic. No accessory muscle use.  On a trach collar Cardiovascular: Tachycardic rate, no murmurs / rubs / gallops. S1 and S2 auscultated. No extremity edema. 2+ pedal pulses. No carotid bruits.  Abdomen: Soft, non-tender, non-distended.  PEG tube is in place GU: Condom cath in  place. Neurologic: Does not follow commands and continues to have some hypertonicity and spasticity with no appreciable movements of his upper extremities. Psychiatric: He is awake but unable to fully assess given his current condition  Data Reviewed: I have personally reviewed following labs and imaging studies  CBC: Recent Labs  Lab 03/12/20 0919 03/15/20 1021  WBC 4.6 3.8*  NEUTROABS 3.3 2.6  HGB 14.2 14.1  HCT 42.9 45.5  MCV 94.1 96.8  PLT 179 703*   Basic Metabolic Panel: Recent Labs  Lab 03/12/20 0919 03/15/20 1021  NA 144 143  K 3.9 3.9  CL 106 106  CO2 26 25  GLUCOSE 96 112*  BUN 16 20  CREATININE 0.70 0.65  CALCIUM 10.9* 10.6*  MG  --  1.9  PHOS  --  3.8   GFR: Estimated Creatinine Clearance: 94.1 mL/min (by C-G formula based on SCr of 0.65 mg/dL). Liver Function Tests: Recent Labs  Lab 03/12/20 0919 03/15/20 1021  AST 25 26  ALT 18 20  ALKPHOS 93 96  BILITOT 0.5 0.6  PROT 8.0 8.4*  ALBUMIN 3.5 3.7   No results for input(s): LIPASE, AMYLASE in the last 168 hours. No results for input(s): AMMONIA in the last 168 hours. Coagulation Profile: No results for input(s): INR, PROTIME in the last 168 hours. Cardiac Enzymes: No results for input(s): CKTOTAL, CKMB, CKMBINDEX, TROPONINI in the last 168 hours. BNP (last 3 results) No results for input(s): PROBNP in the last 8760 hours. HbA1C: No results for input(s): HGBA1C in the last 72  hours. CBG: Recent Labs  Lab 03/15/20 0745 03/15/20 1151 03/15/20 1534 03/15/20 1958 03/16/20 0003  GLUCAP 116* 91 124* 98 100*   Lipid Profile: No results for input(s): CHOL, HDL, LDLCALC, TRIG, CHOLHDL, LDLDIRECT in the last 72 hours. Thyroid Function Tests: No results for input(s): TSH, T4TOTAL, FREET4, T3FREE, THYROIDAB in the last 72 hours. Anemia Panel: No results for input(s): VITAMINB12, FOLATE, FERRITIN, TIBC, IRON, RETICCTPCT in the last 72 hours. Sepsis Labs: No results for input(s): PROCALCITON,  LATICACIDVEN in the last 168 hours.  Recent Results (from the past 240 hour(s))  Culture, blood (Routine X 2) w Reflex to ID Panel     Status: None (Preliminary result)   Collection Time: 03/12/20  9:19 AM   Specimen: BLOOD LEFT FOREARM  Result Value Ref Range Status   Specimen Description BLOOD LEFT FOREARM  Final   Special Requests   Final    BOTTLES DRAWN AEROBIC AND ANAEROBIC Blood Culture results may not be optimal due to an inadequate volume of blood received in culture bottles   Culture   Final    NO GROWTH 3 DAYS Performed at [facility], [place] [address] [place], [place] [address]    Report Status PENDING  Incomplete  Culture, blood (Routine X 2) w Reflex to ID Panel     Status: None (Preliminary result)   Collection Time: 03/12/20  9:22 AM   Specimen: BLOOD  Result Value Ref Range Status   Specimen Description BLOOD LEFT ANTECUBITAL  Final   Special Requests   Final    BOTTLES DRAWN AEROBIC AND ANAEROBIC Blood Culture results may not be optimal due to an inadequate volume of blood received in culture bottles   Culture   Final    NO GROWTH 3 DAYS Performed at [facility], [place] [address], [place], [place] [address]    Report Status PENDING  Incomplete     RN Pressure Injury Documentation:     Estimated body mass index is 26.33 kg/m as calculated from the following:   Height as of this encounter: 5' 6"  (1.676 m).   Weight as of this encounter: 74 kg.  Malnutrition Type:  Nutrition Problem: Increased nutrient needs Etiology: acute illness   Malnutrition Characteristics:  Signs/Symptoms: estimated needs   Nutrition Interventions:  Interventions: Tube feeding     Radiology Studies: No results found.  Scheduled Meds: . acetaminophen (TYLENOL) oral liquid 160 mg/5 mL  650 mg Per Tube Q6H  . amLODipine  10 mg Per Tube Daily  . bacitracin   Topical BID  . baclofen  30 mg Per Tube TID  . chlorhexidine gluconate (MEDLINE KIT)  15 mL Mouth Rinse  BID  . Darunavir-Cobicisctat-Emtricitabine-Tenofovir Alafenamide  1 tablet Oral Q breakfast  . famotidine  10.4 mg Per Tube BID  . feeding supplement (OSMOLITE 1.5 CAL)  237 mL Per Tube Q24H  . feeding supplement (OSMOLITE 1.5 CAL)  474 mL Per Tube TID  . feeding supplement (PROSource TF)  45 mL Per Tube TID  . fiber  1 packet Per Tube BID  . folic acid  1 mg Per Tube Daily  . free water  200 mL Per Tube Q4H  . gabapentin  200 mg Per Tube Q8H  . heparin injection (subcutaneous)  5,000 Units Subcutaneous Q8H  . levETIRAcetam  1,500 mg Per Tube BID  . magic mouthwash w/lidocaine  5 mL Oral QID  . Muscle Rub   Topical QID  . oxyCODONE  5 mg  Per Tube Q6H  . polyethylene glycol  17 g Per Tube QHS  . QUEtiapine  12.5 mg Per Tube QHS  . scopolamine  1 patch Transdermal Q72H  . sulfamethoxazole-trimethoprim  1 tablet Per Tube Once per day on Mon Wed Fri  . thiamine  100 mg Per Tube Daily  . tiZANidine  6 mg Per Tube Q6H   Continuous Infusions:   LOS: 136 days    Kerney Elbe, DO [facility] PAGER is on AMION  If 7PM-7AM, please contact night-coverage [email or web address]

## 2020-03-16 NOTE — Plan of Care (Signed)
  Problem: Clinical Measurements: Goal: Will remain free from infection Outcome: Not Progressing Goal: Diagnostic test results will improve Outcome: Not Progressing Goal: Respiratory complications will improve Outcome: Not Progressing Goal: Cardiovascular complication will be avoided Outcome: Not Progressing   Problem: Activity: Goal: Risk for activity intolerance will decrease Outcome: Not Progressing   Problem: Nutrition: Goal: Adequate nutrition will be maintained Outcome: Not Progressing   Problem: Coping: Goal: Level of anxiety will decrease Outcome: Not Progressing   Problem: Elimination: Goal: Will not experience complications related to bowel motility Outcome: Not Progressing Goal: Will not experience complications related to urinary retention Outcome: Not Progressing   Problem: Pain Managment: Goal: General experience of comfort will improve Outcome: Not Progressing   Problem: Safety: Goal: Ability to remain free from injury will improve Outcome: Not Progressing

## 2020-03-17 LAB — CULTURE, BLOOD (ROUTINE X 2)
Culture: NO GROWTH
Culture: NO GROWTH

## 2020-03-17 NOTE — Progress Notes (Signed)
PROGRESS NOTE    Brixton [place]  EVO:[id number] DOB: [birth year] DOA: 11/01/2019 PCP: Default, Provider, MD   Brief Narrative:  The patient is a 55 year old African-American male with past medical history significant for but not limited to HIV with chronic alcohol abuse as well as other comorbidities who presented to the emergency room after he is found unresponsive outside a convenience store. EMS found him pulseless in PEA arrest with an unknown downtime and he was successfully resuscitated and brought to the emergency department. UDS was positive for THC and benzodiazepines. CT of the head was unremarkable. He was intubated and stabilized and has failed extubation trials due to severe anoxic brain injury had a tracheostomy placed. Subsequently he also had a PEG tube placed then. 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 Physiatry has been consulted and recommendations and orders have been placed. Currently he remains inpatient due to his altered mental status, unsafe discharge disposition and severity of illness due to his anoxic brain injury. Current barrier to discharge is that he has no SNF bed offers and needs a trach capable facility as well as still awaiting approval for Medicaid and disability.  Assessment & Plan:   Active Problems:   Cardiac arrest [facility])   Community acquired pneumonia of left upper lobe of lung   Anoxic brain injury ([place])   Alcohol abuse   Acute respiratory failure with hypoxia (HCC)   Vomiting   Pressure injury of skin   Small bowel obstruction (HCC)   Palliative care encounter   Bowel obstruction (HCC)   Chronic respiratory failure (HCC)   Diastolic dysfunction   Tracheostomy dependent (HCC)   Dysphagia   Protein-calorie malnutrition (HCC)   Seizures (HCC)   Bilateral recurrent inguinal hernia   Muscle spasticity   Spastic tetraplegia with rigidity syndrome  (HCC)   Tracheobronchitis   Sequela of Corynebacterium infection   Abnormal urine   Urinary tract infection due to Enterococcus   Fever  Anoxic brain injury 2/2 PEA arrest/pain syndrome secondary to hypertonicity/spastic tetraplegia:  -Presumed 2/2 combination alcohol and BZD overdose with UDS positive for THC and BZDs -Significant brain injury/spastic tetraplegia -needs long-term SNF -Continue baclofen, gabapentin, scheduled oxycodone and Zanaflex -Appreciate assistance of [name]; 1/6 Botox injection into the quadriceps and rectus femoris as well as the biceps femoris and hamstrings, semimembranosus and semitendinosus.  Unable to inject iliopsoas without EMG guidance w/o significant improvement and spasticity hypertonicity. -Continue bilateral UE WHO resting splints/PROM q 4 hours/KREG bed -Continue Seroquel  -Trial out of bed to chair with lift   Low-grade fever/abnormal urinalysis -2/8: Solitary T-max 100.5 overnight.  Likely related to atelectasis.  Obtain chest x-ray that was unremarkable -As a precaution we will go ahead and obtain urinalysis and culture and blood cultures; electrolytes unremarkable, LDH normal at 178, no leukocytosis -Urinalysis abnormal with hazy appearance, trace leukocytes, rare bacteria and 11-20 WBCs-urine culture pending -Blood cultures pending and currently without any obvious growth at 4 Days   Blister dorsum left foot  03/15/20 -Possibly pressure related -We will ask wound care to evaluate; [place] nurse evaluated and recommending: "Gently pull skin back in place. Apply Xeroform gauze to the open wound, cover with foam dressing. Change Xeroform daily. Foam dressing may be changed every 3 days or PRN soiling."  Chronic respiratory failure with inability to maintain patent airway requiring chronic tracheostomy tube/Recent Corynebacterium tracheobronchitis (resolved) -Continue #4 cuffless trach in place-trach changed out on 1/10  RT -Given altered  mentation and inability to manage secretions PCCM states patient is not a candidate for elective decannulation  Recent Fever/peri-PEG drainage positive for corynebacterium and rare Pseudomonas -PEG site with some drainage but no erythema, induration or pain -Suspect may have colonization noting recently had corynebacterium tracheobronchitis treated -continue bacitracin ointment  HIV: -CD4 count of 124 October 2021 (had been as high as 250 Dec 2020)-as of today up to 259; viral load obtained on 1/15 was <20 -Dc'd Tivicay and Descovy infavor of Biktarvy to for eventual dc to SNF; per my discussion with infectious disease physician [name] on 12/23 preferred regimen would be Descovy and Tivicay-she will contact the HIV pharmacist to attempt to obtain discount cards to make this a more affordable option.  Once this has been confirmed will transition back to Descovy and Tivicay -CD4 count on 12/23 up to 259 -ID recommends continuing prophylactic Bactrim an additional 3 months  Dysphagia 2/2 anoxic brain injury -Continue tube feeding per PEG -Given altered mentation patient not a candidate for further speech evaluations and will be permanently dependent on enteral tube feedings  Protein calorie malnutrition nutrition Status: Nutrition Problem: Increased nutrient needs Etiology: acute illness Signs/Symptoms: estimated needs Interventions: Tube feeding bolus doses of Osmolite, Juven twice daily, Prosource TF 45 mL 3 times daily Estimated body mass index is 26.33 kg/m as calculated from the following:   Height as of this encounter: _0  (1.676 m).   Weight as of this encounter: 74 kg.  Multiple decubiti not POA     Wound / Incision (Open or Dehisced) 11/17/19 Puncture Abdomen Left;Anterior;Upper G-tube insertion site  (Active)  Date First Assessed/Time First Assessed: 11/17/19 1646   Wound Type: Puncture  Location: Abdomen  Location Orientation: Left;Anterior;Upper  Wound Description  (Comments): G-tube insertion site   Present on Admission: No    Assessments 11/17/2019  4:47 PM 03/14/2020  8:50 AM  Dressing Type Gauze (Comment);Tape dressing Gauze (Comment)  Dressing Changed New --  Dressing Status Clean;Dry;Intact Clean;Dry;Intact  Dressing Change Frequency PRN Daily  Site / Wound Assessment Clean;Dry;Pink Clean;Dry  Peri-wound Assessment Intact --  Closure None --  Drainage Amount None --  Treatment Cleansed Cleansed     No Linked orders to display   Hypertension/Grade 1 Diastolic Dysfunction:  -Continue Amlodipine 10 mg per Tub Daily  -Echocardiogram this admission with preserved LVEF with evidence of grade   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  -Continue Keppra; levetiracetam level 35.9 on 11/12 -no further seizure activity since transitioned out of ICU setting therefore will discontinue seizure precaution  SIRS/Fever 2/2 recurrent enterococcus UTI -Patient developed mild elevation in temperature overnight to 1/13 associated with slight increase in respiratory secretions and congested sounding cough/abnormal pulmonary exam as well as tachycardia./14 respiratory status much improved and no further fevers. -Respiratory viral panel neg for COVID, influenza and RSV.   -UA slightly abnormal/cx c/w enterococcus UTI- started on nitrofurantoin (prior UTI treated w/ augmentin) has completed 3 days of treatment -X-ray with bilateral opacities concerning for early pneumonia but CT of the chest without pneumonia, broad-spectrum antibiotics dc'd 1/14 -Because of dehydration increased frequency of free water from 200 cc every 4 hours to every 2 hours on 1/13- stopped on 1/15  Inguinal hernias -Evaluated by general surgery this admission. Documented as chronically incarcerated. Patient has been clinically stable and tolerating tube feedings and having bowel movements so unless patient obstructed would not be considered a candidate for repair. Even  if obstructed consulting surgeon  was not sure he would be a candidate for hernia repair regardless  Constipation -We will continue scheduled Colace, fiber supplements per tube as well as daily MiraLAX -Suspect requirement for narcotics and multiple muscle relaxers contributing to decreased GI motility and constipation symptoms  Goals of care:  -Palliative care was involved during this hospitalization.  -Goals of care were discussed. remains full code.  -Palliative care recommended outpatient follow-up with palliative providers. -Reconsult Palliative medicine 1/27 and after an extensive discussion daughter does realize that patient quite debilitated and would not survive a resuscitation therefore patient made a DNR  DVT prophylaxis: Heparin 5,000 units sq q8h Code Status: DO NOT RESUSCITATE  Family Communication: No family present at bedside  Disposition Plan: SNF when bed available   Status is: Inpatient  Remains inpatient appropriate because:Unsafe d/c plan, IV treatments appropriate due to intensity of illness or inability to take PO and Inpatient level of care appropriate due to severity of illness   Dispo: The patient is from: Home              Anticipated d/c is to: Home              Anticipated d/c date is: TBD              Patient currently is medically stable to d/c.   Difficult to place patient Yes  Consultants:   PCCM  Neurology  Palliative Care Medicine  IR  Procedures/Signficiant Events: 9/29 Admit post PEA arrest. UDS positive for THC, benzo's. ETOH 177 10/05 EEG ongoing. Versed restarted overnight due to agitation. Nicardipine increased.  10/06 Developed tachypnea with WUA, no follow commands off sedation  10/07 Vomiting, TF held / restarted with recurrent vomiting 10/16 tracheostomy collar for 9 hours 10/18->10/19 off vent all night  10/20> TC 11/15 > downsized to #4 Shiley flex CFS  Antimicrobials:  Anti-infectives (From admission, onward)    Start     Dose/Rate Route Frequency Ordered Stop   02/17/20 1100  nitrofurantoin (FURADANTIN) 25 MG/5ML suspension 100 mg        100 mg Per Tube Every 12 hours 02/17/20 1006 02/20/20 0924   02/15/20 1000  vancomycin (VANCOCIN) IVPB 1000 mg/200 mL premix  Status:  Discontinued        1,000 mg 200 mL/hr over 60 Minutes Intravenous Every 12 hours 02/14/20 2226 02/16/20 0742   02/14/20 2245  piperacillin-tazobactam (ZOSYN) IVPB 3.375 g  Status:  Discontinued        3.375 g 12.5 mL/hr over 240 Minutes Intravenous Every 8 hours 02/14/20 2226 02/16/20 0742   02/14/20 2245  vancomycin (VANCOREADY) IVPB 1500 mg/300 mL        1,500 mg 150 mL/hr over 120 Minutes Intravenous  Once 02/14/20 2226 02/15/20 0739   02/01/20 2200  amoxicillin-clavulanate (AUGMENTIN) 250-62.5 MG/5ML suspension 500 mg        500 mg Per Tube Every 8 hours 02/01/20 1343 02/06/20 1333   01/31/20 1015  levofloxacin (LEVAQUIN) 25 MG/ML solution 500 mg  Status:  Discontinued        500 mg Per Tube Daily 01/31/20 0919 02/01/20 1343   01/31/20 1000  cefTRIAXone (ROCEPHIN) 1 g in sodium chloride 0.9 % 100 mL IVPB  Status:  Discontinued        1 g 200 mL/hr over 30 Minutes Intravenous Every 24 hours 01/31/20 0905 01/31/20 0919   01/19/20 2200  linezolid (ZYVOX) tablet 600 mg        600 mg Per Tube  Every 12 hours 01/19/20 1534 01/25/20 2019   01/19/20 1100  levofloxacin (LEVAQUIN) tablet 500 mg  Status:  Discontinued        500 mg Per Tube Daily 01/19/20 1004 01/19/20 1534   12/19/19 0930  Darunavir-Cobicisctat-Emtricitabine-Tenofovir Alafenamide (SYMTUZA) 800-150-200-10 MG TABS 1 tablet        1 tablet Oral Daily with breakfast 12/19/19 0840     12/13/19 1000  bictegravir-emtricitabine-tenofovir AF (BIKTARVY) 50-200-25 MG per tablet 1 tablet  Status:  Discontinued        1 tablet Oral Daily 12/12/19 1413 12/19/19 0840   11/17/19 1548  ceFAZolin (ANCEF) 2-4 GM/100ML-% IVPB       Note to Pharmacy: Domenick Bookbinder   : cabinet override       11/17/19 1548 11/18/19 0359   11/16/19 1515  ceFAZolin (ANCEF) IVPB 2g/100 mL premix        2 g 200 mL/hr over 30 Minutes Intravenous To Radiology 11/16/19 1511 11/17/19 1625   11/15/19 0900  sulfamethoxazole-trimethoprim (BACTRIM DS) 800-160 MG per tablet 1 tablet        1 tablet Per Tube Once per day on Mon Wed Fri 11/13/19 1906     11/13/19 1615  dolutegravir (TIVICAY) tablet 50 mg  Status:  Discontinued        50 mg Oral Daily 11/13/19 1521 12/12/19 1413   11/13/19 1615  emtricitabine-tenofovir AF (DESCOVY) 200-25 MG per tablet 1 tablet  Status:  Discontinued        1 tablet Per Tube Daily 11/13/19 1521 12/12/19 1413   11/13/19 1530  sulfamethoxazole-trimethoprim (BACTRIM DS) 800-160 MG per tablet 1 tablet  Status:  Discontinued        1 tablet Oral Once per day on Mon Wed Fri 11/13/19 1441 11/13/19 1906   11/13/19 1500  bictegravir-emtricitabine-tenofovir AF (BIKTARVY) 50-200-25 MG per tablet 1 tablet  Status:  Discontinued        1 tablet Oral Daily 11/13/19 1403 11/13/19 1521   11/10/19 1000  erythromycin 250 mg in sodium chloride 0.9 % 100 mL IVPB        250 mg 100 mL/hr over 60 Minutes Intravenous Every 8 hours 11/10/19 0907 11/11/19 2000   11/07/19 1200  ceFEPIme (MAXIPIME) 2 g in sodium chloride 0.9 % 100 mL IVPB        2 g 200 mL/hr over 30 Minutes Intravenous Every 8 hours 11/07/19 1013 11/13/19 2127   11/03/19 1200  cefTRIAXone (ROCEPHIN) 2 g in sodium chloride 0.9 % 100 mL IVPB        2 g 200 mL/hr over 30 Minutes Intravenous Every 24 hours 11/03/19 0922 11/05/19 1427   11/02/19 0800  vancomycin (VANCOREADY) IVPB 750 mg/150 mL  Status:  Discontinued        750 mg 150 mL/hr over 60 Minutes Intravenous Every 12 hours 11/01/19 1935 11/02/19 1105   11/02/19 0600  piperacillin-tazobactam (ZOSYN) IVPB 3.375 g  Status:  Discontinued        3.375 g 12.5 mL/hr over 240 Minutes Intravenous Every 8 hours 11/02/19 0311 11/03/19 0920   11/01/19 2200  ceFEPIme (MAXIPIME) 2 g in  sodium chloride 0.9 % 100 mL IVPB  Status:  Discontinued        2 g 200 mL/hr over 30 Minutes Intravenous Every 12 hours 11/01/19 2143 11/02/19 0311   11/01/19 1915  vancomycin (VANCOREADY) IVPB 1500 mg/300 mL        1,500 mg 150 mL/hr over 120 Minutes Intravenous  Once  11/01/19 1909 11/01/19 2147   11/01/19 1845  cefTRIAXone (ROCEPHIN) 1 g in sodium chloride 0.9 % 100 mL IVPB        1 g 200 mL/hr over 30 Minutes Intravenous  Once 11/01/19 1842 11/01/19 2051   11/01/19 1845  azithromycin (ZITHROMAX) 500 mg in sodium chloride 0.9 % 250 mL IVPB        500 mg 250 mL/hr over 60 Minutes Intravenous  Once 11/01/19 1842 11/01/19 2051       Subjective: Seen and examined bedside again his mentation is at baseline and he is mumbling and incomprehensible.  Did not appear to be in any acute distress and cannot provide any meaningful history.  Again getting a bath from the nursing tach.  No other concerns or plans at this time  Objective: Vitals:   03/17/20 0353 03/17/20 0855 03/17/20 0906 03/17/20 1127  BP:  124/85    Pulse: 89 91 92 87  Resp: _0 Temp:  97.8 F (36.6 C)    TempSrc:  Oral    SpO2: 98% 100% 100% 100%  Weight:      Height:        Intake/Output Summary (Last 24 hours) at 03/17/2020 1237 Last data filed at 03/17/2020 0703 Gross per 24 hour  Intake --  Output 1500 ml  Net -1500 ml   Filed Weights   03/06/20 0500 03/14/20 0740 03/15/20 0742  Weight: 72 kg 74 kg 74 kg    Examination: Physical Exam:  Constitutional:_Well developed chronically ill-appearing male who is awake in no acute distress appears mildly agitated Respiratory: Diminished to auscultation bilaterally on trach collar.  No accessory muscle use was treated Cardiovascular: Slightly tachycardic..  No appreciable extremity noted. Abdomen: Soft, nontender, nondistended.  PEG tube is in place. GU: Condom catheter in place Neurologic: Does not follow commands and continues to have some hypertonicity  and spasticity in upper extremities Psychiatric: He is awake but unable to fully assess given his current condition  Data Reviewed: I have personally reviewed following labs and imaging studies  CBC: Recent Labs  Lab 03/12/20 0919 03/15/20 1021  WBC 4.6 3.8*  NEUTROABS 3.3 2.6  HGB 14.2 14.1  HCT 42.9 45.5  MCV 94.1 96.8  PLT 179 594*   Basic Metabolic Panel: Recent Labs  Lab 03/12/20 0919 03/15/20 1021  NA 144 143  K 3.9 3.9  CL 106 106  CO2 26 25  GLUCOSE 96 112*  BUN 16 20  CREATININE 0.70 0.65  CALCIUM 10.9* 10.6*  MG  --  1.9  PHOS  --  3.8   GFR: Estimated Creatinine Clearance: 94.1 mL/min (by C-G formula based on SCr of 0.65 mg/dL). Liver Function Tests: Recent Labs  Lab 03/12/20 0919 03/15/20 1021  AST 25 26  ALT 18 20  ALKPHOS 93 96  BILITOT 0.5 0.6  PROT 8.0 8.4*  ALBUMIN 3.5 3.7   No results for input(s): LIPASE, AMYLASE in the last 168 hours. No results for input(s): AMMONIA in the last 168 hours. Coagulation Profile: No results for input(s): INR, PROTIME in the last 168 hours. Cardiac Enzymes: No results for input(s): CKTOTAL, CKMB, CKMBINDEX, TROPONINI in the last 168 hours. BNP (last 3 results) No results for input(s): PROBNP in the last 8760 hours. HbA1C: No results for input(s): HGBA1C in the last 72 hours. CBG: Recent Labs  Lab 03/15/20 0745 03/15/20 1151 03/15/20 1534 03/15/20 1958 03/16/20 0003  GLUCAP 116* 91 124* 98 100*   Lipid Profile:  No results for input(s): CHOL, HDL, LDLCALC, TRIG, CHOLHDL, LDLDIRECT in the last 72 hours. Thyroid Function Tests: No results for input(s): TSH, T4TOTAL, FREET4, T3FREE, THYROIDAB in the last 72 hours. Anemia Panel: No results for input(s): VITAMINB12, FOLATE, FERRITIN, TIBC, IRON, RETICCTPCT in the last 72 hours. Sepsis Labs: No results for input(s): PROCALCITON, LATICACIDVEN in the last 168 hours.  Recent Results (from the past 240 hour(s))  Culture, blood (Routine X 2) w Reflex to  ID Panel     Status: None (Preliminary result)   Collection Time: 03/12/20  9:19 AM   Specimen: BLOOD LEFT FOREARM  Result Value Ref Range Status   Specimen Description BLOOD LEFT FOREARM  Final   Special Requests   Final    BOTTLES DRAWN AEROBIC AND ANAEROBIC Blood Culture results may not be optimal due to an inadequate volume of blood received in culture bottles   Culture   Final    NO GROWTH 4 DAYS Performed at [facility], [place] [address], [place], [place] [address]    Report Status PENDING  Incomplete  Culture, blood (Routine X 2) w Reflex to ID Panel     Status: None (Preliminary result)   Collection Time: 03/12/20  9:22 AM   Specimen: BLOOD  Result Value Ref Range Status   Specimen Description BLOOD LEFT ANTECUBITAL  Final   Special Requests   Final    BOTTLES DRAWN AEROBIC AND ANAEROBIC Blood Culture results may not be optimal due to an inadequate volume of blood received in culture bottles   Culture   Final    NO GROWTH 4 DAYS Performed at [facility], [place] [address] [place], [place] [address]    Report Status PENDING  Incomplete     RN Pressure Injury Documentation:     Estimated body mass index is 26.33 kg/m as calculated from the following:   Height as of this encounter: _0  (1.676 m).   Weight as of this encounter: 74 kg.  Malnutrition Type:  Nutrition Problem: Increased nutrient needs Etiology: acute illness   Malnutrition Characteristics:  Signs/Symptoms: estimated needs   Nutrition Interventions:  Interventions: Tube feeding     Radiology Studies: No results found.  Scheduled Meds: . acetaminophen (TYLENOL) oral liquid 160 mg/5 mL  650 mg Per Tube Q6H  . amLODipine  10 mg Per Tube Daily  . bacitracin   Topical BID  . baclofen  30 mg Per Tube TID  . chlorhexidine gluconate (MEDLINE KIT)  15 mL Mouth Rinse BID  . Darunavir-Cobicisctat-Emtricitabine-Tenofovir Alafenamide  1 tablet Oral Q breakfast  . famotidine  10.4 mg Per  Tube BID  . feeding supplement (OSMOLITE 1.5 CAL)  237 mL Per Tube Q24H  . feeding supplement (OSMOLITE 1.5 CAL)  474 mL Per Tube TID  . feeding supplement (PROSource TF)  45 mL Per Tube TID  . fiber  1 packet Per Tube BID  . folic acid  1 mg Per Tube Daily  . free water  200 mL Per Tube Q4H  . gabapentin  200 mg Per Tube Q8H  . heparin injection (subcutaneous)  5,000 Units Subcutaneous Q8H  . levETIRAcetam  1,500 mg Per Tube BID  . magic mouthwash w/lidocaine  5 mL Oral QID  . Muscle Rub   Topical QID  . oxyCODONE  5 mg Per Tube Q6H  . polyethylene glycol  17 g Per Tube QHS  . QUEtiapine  12.5 mg Per Tube QHS  . scopolamine  1  patch Transdermal Q72H  . sulfamethoxazole-trimethoprim  1 tablet Per Tube Once per day on Mon Wed Fri  . thiamine  100 mg Per Tube Daily  . tiZANidine  6 mg Per Tube Q6H   Continuous Infusions:   LOS: 137 days    Kerney Elbe, DO [facility] PAGER is on [place]  If 7PM-7AM, please contact night-coverage [email or web address]

## 2020-03-18 ENCOUNTER — Inpatient Hospital Stay (HOSPITAL_COMMUNITY): Payer: Medicaid Other

## 2020-03-18 LAB — CBC WITH DIFFERENTIAL/PLATELET
Abs Immature Granulocytes: 0.02 10*3/uL (ref 0.00–0.07)
Basophils Absolute: 0 10*3/uL (ref 0.0–0.1)
Basophils Relative: 1 %
Eosinophils Absolute: 0.1 10*3/uL (ref 0.0–0.5)
Eosinophils Relative: 2 %
HCT: 45.6 % (ref 39.0–52.0)
Hemoglobin: 14.8 g/dL (ref 13.0–17.0)
Immature Granulocytes: 0 %
Lymphocytes Relative: 23 %
Lymphs Abs: 1.2 10*3/uL (ref 0.7–4.0)
MCH: 31.3 pg (ref 26.0–34.0)
MCHC: 32.5 g/dL (ref 30.0–36.0)
MCV: 96.4 fL (ref 80.0–100.0)
Monocytes Absolute: 0.8 10*3/uL (ref 0.1–1.0)
Monocytes Relative: 16 %
Neutro Abs: 3.1 10*3/uL (ref 1.7–7.7)
Neutrophils Relative %: 58 %
Platelets: 198 10*3/uL (ref 150–400)
RBC: 4.73 MIL/uL (ref 4.22–5.81)
RDW: 13.1 % (ref 11.5–15.5)
WBC: 5.3 10*3/uL (ref 4.0–10.5)
nRBC: 0 % (ref 0.0–0.2)

## 2020-03-18 LAB — COMPREHENSIVE METABOLIC PANEL
ALT: 21 U/L (ref 0–44)
AST: 26 U/L (ref 15–41)
Albumin: 3.6 g/dL (ref 3.5–5.0)
Alkaline Phosphatase: 87 U/L (ref 38–126)
Anion gap: 12 (ref 5–15)
BUN: 23 mg/dL — ABNORMAL HIGH (ref 6–20)
CO2: 26 mmol/L (ref 22–32)
Calcium: 10.8 mg/dL — ABNORMAL HIGH (ref 8.9–10.3)
Chloride: 110 mmol/L (ref 98–111)
Creatinine, Ser: 0.67 mg/dL (ref 0.61–1.24)
GFR, Estimated: >60 mL/min (ref >60)
Glucose, Bld: 99 mg/dL (ref 70–99)
Potassium: 4 mmol/L (ref 3.5–5.1)
Sodium: 148 mmol/L — ABNORMAL HIGH (ref 135–145)
Total Bilirubin: 0.6 mg/dL (ref 0.3–1.2)
Total Protein: 8.4 g/dL — ABNORMAL HIGH (ref 6.5–8.1)

## 2020-03-18 LAB — GLUCOSE, CAPILLARY
Glucose-Capillary: 101 mg/dL — ABNORMAL HIGH (ref 70–99)
Glucose-Capillary: 109 mg/dL — ABNORMAL HIGH (ref 70–99)
Glucose-Capillary: 111 mg/dL — ABNORMAL HIGH (ref 70–99)
Glucose-Capillary: 117 mg/dL — ABNORMAL HIGH (ref 70–99)
Glucose-Capillary: 118 mg/dL — ABNORMAL HIGH (ref 70–99)
Glucose-Capillary: 122 mg/dL — ABNORMAL HIGH (ref 70–99)
Glucose-Capillary: 188 mg/dL — ABNORMAL HIGH (ref 70–99)
Glucose-Capillary: 200 mg/dL — ABNORMAL HIGH (ref 70–99)
Glucose-Capillary: 85 mg/dL (ref 70–99)
Glucose-Capillary: 94 mg/dL (ref 70–99)
Glucose-Capillary: 97 mg/dL (ref 70–99)
Glucose-Capillary: 110 mg/dL — ABNORMAL HIGH (ref 70–99)
Glucose-Capillary: 124 mg/dL — ABNORMAL HIGH (ref 70–99)
Glucose-Capillary: 127 mg/dL — ABNORMAL HIGH (ref 70–99)
Glucose-Capillary: 166 mg/dL — ABNORMAL HIGH (ref 70–99)

## 2020-03-18 LAB — PHOSPHORUS: Phosphorus: 3.5 mg/dL (ref 2.5–4.6)

## 2020-03-18 LAB — MAGNESIUM: Magnesium: 2.2 mg/dL (ref 1.7–2.4)

## 2020-03-18 MED ORDER — DEXTROSE 5 % IV SOLN: INTRAVENOUS | Status: AC

## 2020-03-18 NOTE — Plan of Care (Signed)
  Problem: Coping: Goal: Level of anxiety will decrease Outcome: Progressing   Problem: Elimination: Goal: Will not experience complications related to bowel motility Outcome: Progressing   Problem: Elimination: Goal: Will not experience complications related to urinary retention Outcome: Progressing   Problem: Pain Managment: Goal: General experience of comfort will improve Outcome: Progressing   

## 2020-03-18 NOTE — Plan of Care (Signed)
  Problem: Clinical Measurements: Goal: Respiratory complications will improve Outcome: Progressing   Problem: Clinical Measurements: Goal: Cardiovascular complication will be avoided Outcome: Progressing   Problem: Coping: Goal: Level of anxiety will decrease Outcome: Progressing   Problem: Pain Managment: Goal: General experience of comfort will improve Outcome: Progressing   

## 2020-03-18 NOTE — Progress Notes (Signed)
[facility] PROGRESS NOTE  Ryan Orozco DXI:[id number] DOB: [birth year] DOA: 11/01/2019 PCP: Default, Provider, MD       12/19/19                          03/15/20 OOB to chair   Status: Remains inpatient appropriate because:Altered mental status, Unsafe d/c plan and Inpatient level of care appropriate due to severity of illness. Patient newly diagnosed with anoxic brain injury  Dispo: The patient is from: Home              Anticipated d/c is to: SNF              Anticipated d/c date is: > 3 days              Patient currently is medically stable to d/c.  Barriers to discharge: No SNF bed offers.  Needs trach capable facility.  Still awaiting approval of Medicaid and disability.  No bed offers.   Code Status: DNR after d/w palliative team on 1/27 Family Communication: 2/11 updated dtr [name] via text to include picture of her father up in chair-she will be in [place] on 2/21 DVT prophylaxis: Subcutaneous heparin Vaccination status: Will order first dose Pfizer COVID-vaccine on 1/10; next week we will order pneumococcal and influenza vaccinations-2nd dose ordered 277  Foley catheter:  No  HPI: 55 year old male with HIV, chronic alcohol abuse who was presented to the emergency department after being found unresponsive outside a convinient store.  EMS found him pulseless in PEA, unknown downtime, successfully resuscitated and brought to the emergency department.  UDS positive for THC and benzodiazepines.  CT head unremarkable.  Failed extubation trials due to severe anoxic brain injury and had tracheostomy placed.  PEG placed on 10/15.  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  This has led to significant pain as well.  [name] was consulted and recommendations/input/orders highly appreciated.  She may also benefit from Botox injections to both hips to aid in treatment of spasticity.    Significant Hospital Events   9/29 Admit post PEA arrest. UDS positive for THC, benzo's. ETOH 177 10/05 EEG ongoing. Versed restarted overnight due to agitation. Nicardipine increased.  10/06 Developed tachypnea with WUA, no follow commands off sedation  10/07 Vomiting, TF held / restarted with recurrent vomiting 10/16 tracheostomy collar for 9 hours 10/18->10/19 off vent all night  10/20> TC 11/15 > downsized to #4 Shiley flex CFS  Subjective: Awake and very little restless.  Moving arms in an effort to try to get out of bed.  Objective: Vitals:   03/17/20 2348 03/18/20 0320  BP: 138/78   Pulse: 84 82  Resp: 18 (!) 22  Temp: 98 F (36.7 C)   SpO2: 97% 98%    Intake/Output Summary (Last 24 hours) at 03/18/2020 0738 Last data filed at 03/18/2020 0552 Gross per 24 hour  Intake 75 ml  Output 1675 ml  Net -1600 ml   Filed Weights   03/06/20 0500 03/14/20 0740 03/15/20 0742  Weight: 72 kg 74 kg 74 kg    Exam: General: Awake and alert.  Somewhat restless today and very animated Pulmonary: Anterior lung sounds clear and no increased work of breathing.  FiO2 21% via humidified trach collar.  No secretions noted Cardiac: Normal heart sounds, pulse regular, extremities warm to touch with adequate capillary refill Abdomen: Soft nontender nondistended.  PEG tube feeding/medications. LBM 2/13  Neurological: Severe hypertonicity/spasticity bilateral lower extremities involving the hip flexors and hamstrings; primarily keeps bilateral hips flexed -continues to have pain w/ attempts to extend either leg especially the right leg.  Nonpurposeful use of upper extremities.   Psychiatric: Alert, more restless today than baseline.  Nonverbal so unable unable to accurately ascertain mentation and orientation  Assessment/Plan: Acute problems: Anoxic brain injury 2/2 PEA arrest/pain syndrome secondary to hypertonicity/spastic tetraplegia:  -Presumed 2/2 combination alcohol and BZD overdose  with UDS positive for THC and BZDs -Significant brain injury/spastic tetraplegia -needs long-term SNF -Continue baclofen, gabapentin, scheduled oxycodone and Zanaflex -Appreciate assistance of [name]; 1/6 Botox injection into the quadriceps and rectus femoris as well as the biceps femoris and hamstrings, semimembranosus and semitendinosus.  Unable to inject iliopsoas without EMG guidance w/o significant improvement and spasticity hypertonicity. -Continue bilateral UE WHO resting splints/PROM q 4 hours/KREG bed -Continue Seroquel  -Trial out of bed to chair with lift today  Low-grade fever/abnormal urinalysis -2/8: Solitary T-max 100.5 overnight.  Likely related to atelectasis.  Obtain chest x-ray today that was unremarkable -As a precaution we will go ahead and obtain urinalysis and culture and blood cultures; electrolytes unremarkable, LDH normal at 178, no leukocytosis -Urinalysis abnormal with hazy appearance, trace leukocytes, rare bacteria and 11-20 WBCs-urine culture was never obtained and subsequently fever has resolved -Blood cultures are negative -Chest x-ray obtained on 2/14 unremarkable  Blister dorsum left foot  03/15/20 -Possibly pressure related -Appreciate wound care nurse assistance.  Superficial skin from blister debrided and Xeroform gauze applied to open wound and was covered with a foam dressing with recommendations to change the Xerofoam daily and the foam dressing every 3 days or as needed for soiling  Goals of care:  -Palliative care team reconsulted on 1/27 and after an extensive discussion daughter does realize that patient quite debilitated and would not survive a resuscitation therefore patient made a DNR  Chronic respiratory failure with inability to maintain patent airway requiring chronic tracheostomy tube/Recent Corynebacterium tracheobronchitis (resolved) -Continue #4 cuffless trach in place-trach changed out on 1/10 RT -Given altered mentation and  inability to manage secretions PCCM states patient is not a candidate for elective decannulation  Fever/peri-PEG drainage positive for corynebacterium and rare Pseudomonas -PEG site with some drainage but no erythema, induration or pain -Suspect may have colonization noting recently had corynebacterium tracheobronchitis treated -continue bacitracin ointment  HIV:  -CD4 count of 124 October 2021 (had been as high as 250 Dec 2020)-as of today up to 259; viral load obtained on 1/15 was <20 -Dc'd Tivicay and Descovy infavor of Biktarvy to for eventual dc to SNF; per my discussion with infectious disease physician [name] on 12/23 preferred regimen would be Descovy and Tivicay-she will contact the HIV pharmacist to attempt to obtain discount cards to make this a more affordable option.  Once this has been confirmed will transition back to Descovy and Tivicay -CD4 count on 12/23 up to 259 -ID recommends continuing prophylactic Bactrim an additional 3 months  Dysphagia 2/2 anoxic brain injury -Continue tube feeding per PEG -Given altered mentation patient not a candidate for further speech evaluations and will be permanently dependent on enteral tube feedings  Constipation -We will continue scheduled Colace, fiber supplements per tube as well as daily MiraLAX -Suspect requirement for narcotics and multiple muscle relaxers contributing to decreased GI motility and constipation symptoms  Protein calorie malnutrition nutrition Status: Nutrition Problem: Increased nutrient needs Etiology: acute illness Signs/Symptoms: estimated needs Interventions: Tube feeding bolus doses of  Osmolite, Juven twice daily, Prosource TF 45 mL 3 times daily Estimated body mass index is 26.33 kg/m as calculated from the following:   Height as of this encounter: _0  (1.676 m).   Weight as of this encounter: 74 kg.   Multiple decubiti not POA Wound / Incision (Open or Dehisced) 11/17/19 Puncture Abdomen  Left;Anterior;Upper G-tube insertion site  (Active)  Date First Assessed/Time First Assessed: 11/17/19 1646   Wound Type: Puncture  Location: Abdomen  Location Orientation: Left;Anterior;Upper  Wound Description (Comments): G-tube insertion site   Present on Admission: No    Assessments 11/17/2019  4:47 PM 03/17/2020 10:38 PM  Dressing Type Gauze (Comment);Tape dressing Gauze (Comment)  Dressing Changed New Changed  Dressing Status Clean;Dry;Intact Dry;Intact  Dressing Change Frequency PRN --  Site / Wound Assessment Clean;Dry;Pink Clean;Dry;Red  Peri-wound Assessment Intact Intact  Closure None --  Drainage Amount None None  Treatment Cleansed Cleansed;Other (Comment)     No Linked orders to display     Wound / Incision (Open or Dehisced) 03/16/20 Other (Comment) Foot Anterior;Left (Active)  Date First Assessed/Time First Assessed: 03/16/20 0800   Wound Type: (c) Other (Comment)  Location: Foot  Location Orientation: Anterior;Left  Present on Admission: No    Assessments 03/16/2020  8:00 AM 03/17/2020 10:38 PM  Dressing Type Other (Comment) Foam - Lift dressing to assess site every shift;Impregnated gauze (petrolatum)  Dressing Changed Changed Changed  Dressing Status Old drainage Old drainage  Dressing Change Frequency Daily Daily  Site / Wound Assessment Pink;Red Red;Pink  Peri-wound Assessment Intact Intact  Drainage Amount Minimal Scant  Drainage Description Serosanguineous Serosanguineous  Treatment Cleansed Cleansed     No Linked orders to display      Other problems: Hypertension/grade 1 diastolic dysfunction:  -Continue amlodipine  -Echocardiogram this admission with preserved LVEF with evidence of grade   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   -Continue Keppra; levetiracetam level 35.9 on 11/12 -no further seizure activity since transitioned out of ICU setting therefore will discontinue seizure precaution  SIRS/Fever 2/2 recurrent enterococcus  UTI -Patient developed mild elevation in temperature overnight to 1/13 associated with slight increase in respiratory secretions and congested sounding cough/abnormal pulmonary exam as well as tachycardia./14 respiratory status much improved and no further fevers. -Respiratory viral panel neg for COVID, influenza and RSV.   -UA slightly abnormal/cx c/w enterococcus UTI- started on nitrofurantoin (prior UTI treated w/ augmentin) has completed 3 days of treatment -X-ray with bilateral opacities concerning for early pneumonia but CT of the chest without pneumonia, broad-spectrum antibiotics dc'd 1/14 -Because of dehydration increased frequency of free water from 200 cc every 4 hours to every 2 hours on 1/13- stopped on 1/15  Inguinal hernias -Evaluated by general surgery this admission. Documented as chronically incarcerated. Patient has been clinically stable and tolerating tube feedings and having bowel movements so unless patient obstructed would not be considered a candidate for repair. Even if obstructed consulting surgeon was not sure he would be a candidate for hernia repair regardless  Constipation -No documented bowel movement since 1/13 -We will continue scheduled Colace, fiber supplements per tube -Add daily MiraLAX -Given one-time dose milk of magnesia on 1/20-no documented bowel movement on flowsheet -Suspect requirement for narcotics and multiple muscle relaxers contributing to decreased GI motility and constipation symptoms  Goals of care:  -Palliative care was involved during this hospitalization.   -Goals of care were discussed. remains full code.  -Palliative care recommended outpatient follow-up with palliative providers. -Reconsult  Palliative medicine 1/27   Data Reviewed: Basic Metabolic Panel: Recent Labs  Lab 03/12/20 0919 03/15/20 1021 03/18/20 0135  NA 144 143 148*  K 3.9 3.9 4.0  CL 106 106 110  CO2 _0 GLUCOSE 96 112* 99  BUN 16 20 23*  CREATININE  0.70 0.65 0.67  CALCIUM 10.9* 10.6* 10.8*  MG  --  1.9 2.2  PHOS  --  3.8 3.5   Liver Function Tests: Recent Labs  Lab 03/12/20 0919 03/15/20 1021 03/18/20 0135  AST _1 ALT _2 ALKPHOS 93 96 87  BILITOT 0.5 0.6 0.6  PROT 8.0 8.4* 8.4*  ALBUMIN 3.5 3.7 3.6   No results for input(s): LIPASE, AMYLASE in the last 168 hours. No results for input(s): AMMONIA in the last 168 hours. CBC: Recent Labs  Lab 03/12/20 0919 03/15/20 1021 03/18/20 0135  WBC 4.6 3.8* 5.3  NEUTROABS 3.3 2.6 3.1  HGB 14.2 14.1 14.8  HCT 42.9 45.5 45.6  MCV 94.1 96.8 96.4  PLT 179 145* 198   Cardiac Enzymes: No results for input(s): CKTOTAL, CKMB, CKMBINDEX, TROPONINI in the last 168 hours. BNP (last 3 results) Recent Labs    02/15/20 0433  BNP 3.9    ProBNP (last 3 results) No results for input(s): PROBNP in the last 8760 hours.  CBG: Recent Labs  Lab 03/15/20 0745 03/15/20 1151 03/15/20 1534 03/15/20 1958 03/16/20 0003  GLUCAP 116* 91 124* 98 100*    Recent Results (from the past 240 hour(s))  Culture, blood (Routine X 2) w Reflex to ID Panel     Status: None   Collection Time: 03/12/20  9:19 AM   Specimen: BLOOD LEFT FOREARM  Result Value Ref Range Status   Specimen Description BLOOD LEFT FOREARM  Final   Special Requests   Final    BOTTLES DRAWN AEROBIC AND ANAEROBIC Blood Culture results may not be optimal due to an inadequate volume of blood received in culture bottles   Culture   Final    NO GROWTH 5 DAYS Performed at [facility], [place] [address], [place], [place] [address]    Report Status 03/17/2020 FINAL  Final  Culture, blood (Routine X 2) w Reflex to ID Panel     Status: None   Collection Time: 03/12/20  9:22 AM   Specimen: BLOOD  Result Value Ref Range Status   Specimen Description BLOOD LEFT ANTECUBITAL  Final   Special Requests   Final    BOTTLES DRAWN AEROBIC AND ANAEROBIC Blood Culture results may not be optimal due to an inadequate volume of  blood received in culture bottles   Culture   Final    NO GROWTH 5 DAYS Performed at [facility], [place] [address], [place], [place] [address]    Report Status 03/17/2020 FINAL  Final     Studies: No results found.  Scheduled Meds: . acetaminophen (TYLENOL) oral liquid 160 mg/5 mL  650 mg Per Tube Q6H  . amLODipine  10 mg Per Tube Daily  . bacitracin   Topical BID  . baclofen  30 mg Per Tube TID  . chlorhexidine gluconate (MEDLINE KIT)  15 mL Mouth Rinse BID  . Darunavir-Cobicisctat-Emtricitabine-Tenofovir Alafenamide  1 tablet Oral Q breakfast  . famotidine  10.4 mg Per Tube BID  . feeding supplement (OSMOLITE 1.5 CAL)  237 mL Per Tube Q24H  . feeding supplement (OSMOLITE 1.5 CAL)  474 mL Per Tube TID  .  feeding supplement (PROSource TF)  45 mL Per Tube TID  . fiber  1 packet Per Tube BID  . folic acid  1 mg Per Tube Daily  . free water  200 mL Per Tube Q4H  . gabapentin  200 mg Per Tube Q8H  . heparin injection (subcutaneous)  5,000 Units Subcutaneous Q8H  . levETIRAcetam  1,500 mg Per Tube BID  . magic mouthwash w/lidocaine  5 mL Oral QID  . Muscle Rub   Topical QID  . oxyCODONE  5 mg Per Tube Q6H  . polyethylene glycol  17 g Per Tube QHS  . QUEtiapine  12.5 mg Per Tube QHS  . scopolamine  1 patch Transdermal Q72H  . sulfamethoxazole-trimethoprim  1 tablet Per Tube Once per day on Mon Wed Fri  . thiamine  100 mg Per Tube Daily  . tiZANidine  6 mg Per Tube Q6H   Continuous Infusions:   Active Problems:   Cardiac arrest [facility])   Community acquired pneumonia of left upper lobe of lung   Anoxic brain injury ([place])   Alcohol abuse   Acute respiratory failure with hypoxia (HCC)   Vomiting   Pressure injury of skin   Small bowel obstruction (HCC)   Palliative care encounter   Bowel obstruction (HCC)   Chronic respiratory failure (HCC)   Diastolic dysfunction   Tracheostomy dependent (HCC)   Dysphagia   Protein-calorie malnutrition ([place])   Seizures ([place])    Bilateral recurrent inguinal hernia   Muscle spasticity   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HCC)   Tracheobronchitis   Sequela of Corynebacterium infection   Abnormal urine   Urinary tract infection due to Enterococcus   Fever   Consultants:  PCCM  Palliative medicine  General surgery  Neurology  Interventional radiology  Procedures:  EEG  Echocardiogram  Tracheostomy  Antibiotics: Anti-infectives (From admission, onward)   Start     Dose/Rate Route Frequency Ordered Stop   02/17/20 1100  nitrofurantoin (FURADANTIN) 25 MG/5ML suspension 100 mg        100 mg Per Tube Every 12 hours 02/17/20 1006 02/20/20 0924   02/15/20 1000  vancomycin (VANCOCIN) IVPB 1000 mg/200 mL premix  Status:  Discontinued        1,000 mg 200 mL/hr over 60 Minutes Intravenous Every 12 hours 02/14/20 2226 02/16/20 0742   02/14/20 2245  piperacillin-tazobactam (ZOSYN) IVPB 3.375 g  Status:  Discontinued        3.375 g 12.5 mL/hr over 240 Minutes Intravenous Every 8 hours 02/14/20 2226 02/16/20 0742   02/14/20 2245  vancomycin (VANCOREADY) IVPB 1500 mg/300 mL        1,500 mg 150 mL/hr over 120 Minutes Intravenous  Once 02/14/20 2226 02/15/20 0739   02/01/20 2200  amoxicillin-clavulanate (AUGMENTIN) 250-62.5 MG/5ML suspension 500 mg        500 mg Per Tube Every 8 hours 02/01/20 1343 02/06/20 1333   01/31/20 1015  levofloxacin (LEVAQUIN) 25 MG/ML solution 500 mg  Status:  Discontinued        500 mg Per Tube Daily 01/31/20 0919 02/01/20 1343   01/31/20 1000  cefTRIAXone (ROCEPHIN) 1 g in sodium chloride 0.9 % 100 mL IVPB  Status:  Discontinued        1 g 200 mL/hr over 30 Minutes Intravenous Every 24 hours 01/31/20 0905 01/31/20 0919   01/19/20 2200  linezolid (ZYVOX) tablet 600 mg        600 mg Per Tube Every 12 hours 01/19/20 1534  01/25/20 2019   01/19/20 1100  levofloxacin (LEVAQUIN) tablet 500 mg  Status:  Discontinued        500 mg Per Tube Daily 01/19/20 1004 01/19/20 1534    12/19/19 0930  Darunavir-Cobicisctat-Emtricitabine-Tenofovir Alafenamide (SYMTUZA) 800-150-200-10 MG TABS 1 tablet        1 tablet Oral Daily with breakfast 12/19/19 0840     12/13/19 1000  bictegravir-emtricitabine-tenofovir AF (BIKTARVY) 50-200-25 MG per tablet 1 tablet  Status:  Discontinued        1 tablet Oral Daily 12/12/19 1413 12/19/19 0840   11/17/19 1548  ceFAZolin (ANCEF) 2-4 GM/100ML-% IVPB       Note to Pharmacy: Domenick Bookbinder   : cabinet override      11/17/19 1548 11/18/19 0359   11/16/19 1515  ceFAZolin (ANCEF) IVPB 2g/100 mL premix        2 g 200 mL/hr over 30 Minutes Intravenous To Radiology 11/16/19 1511 11/17/19 1625   11/15/19 0900  sulfamethoxazole-trimethoprim (BACTRIM DS) 800-160 MG per tablet 1 tablet        1 tablet Per Tube Once per day on Mon Wed Fri 11/13/19 1906     11/13/19 1615  dolutegravir (TIVICAY) tablet 50 mg  Status:  Discontinued        50 mg Oral Daily 11/13/19 1521 12/12/19 1413   11/13/19 1615  emtricitabine-tenofovir AF (DESCOVY) 200-25 MG per tablet 1 tablet  Status:  Discontinued        1 tablet Per Tube Daily 11/13/19 1521 12/12/19 1413   11/13/19 1530  sulfamethoxazole-trimethoprim (BACTRIM DS) 800-160 MG per tablet 1 tablet  Status:  Discontinued        1 tablet Oral Once per day on Mon Wed Fri 11/13/19 1441 11/13/19 1906   11/13/19 1500  bictegravir-emtricitabine-tenofovir AF (BIKTARVY) 50-200-25 MG per tablet 1 tablet  Status:  Discontinued        1 tablet Oral Daily 11/13/19 1403 11/13/19 1521   11/10/19 1000  erythromycin 250 mg in sodium chloride 0.9 % 100 mL IVPB        250 mg 100 mL/hr over 60 Minutes Intravenous Every 8 hours 11/10/19 0907 11/11/19 2000   11/07/19 1200  ceFEPIme (MAXIPIME) 2 g in sodium chloride 0.9 % 100 mL IVPB        2 g 200 mL/hr over 30 Minutes Intravenous Every 8 hours 11/07/19 1013 11/13/19 2127   11/03/19 1200  cefTRIAXone (ROCEPHIN) 2 g in sodium chloride 0.9 % 100 mL IVPB        2 g 200 mL/hr over 30  Minutes Intravenous Every 24 hours 11/03/19 0922 11/05/19 1427   11/02/19 0800  vancomycin (VANCOREADY) IVPB 750 mg/150 mL  Status:  Discontinued        750 mg 150 mL/hr over 60 Minutes Intravenous Every 12 hours 11/01/19 1935 11/02/19 1105   11/02/19 0600  piperacillin-tazobactam (ZOSYN) IVPB 3.375 g  Status:  Discontinued        3.375 g 12.5 mL/hr over 240 Minutes Intravenous Every 8 hours 11/02/19 0311 11/03/19 0920   11/01/19 2200  ceFEPIme (MAXIPIME) 2 g in sodium chloride 0.9 % 100 mL IVPB  Status:  Discontinued        2 g 200 mL/hr over 30 Minutes Intravenous Every 12 hours 11/01/19 2143 11/02/19 0311   11/01/19 1915  vancomycin (VANCOREADY) IVPB 1500 mg/300 mL        1,500 mg 150 mL/hr over 120 Minutes Intravenous  Once 11/01/19 1909 11/01/19 2147  11/01/19 1845  cefTRIAXone (ROCEPHIN) 1 g in sodium chloride 0.9 % 100 mL IVPB        1 g 200 mL/hr over 30 Minutes Intravenous  Once 11/01/19 1842 11/01/19 2051   11/01/19 1845  azithromycin (ZITHROMAX) 500 mg in sodium chloride 0.9 % 250 mL IVPB        500 mg 250 mL/hr over 60 Minutes Intravenous  Once 11/01/19 1842 11/01/19 2051       Time spent: 20 minutes    Erin Hearing ANP  [facility]  7 am-330 pm/M-F for direct patient care and secure chat Please refer to [place] for contact information 03/18/2020, 7:38 AM  LOS: 138 days

## 2020-03-19 LAB — GLUCOSE, CAPILLARY
Glucose-Capillary: 99 mg/dL (ref 70–99)
Glucose-Capillary: 103 mg/dL — ABNORMAL HIGH (ref 70–99)
Glucose-Capillary: 140 mg/dL — ABNORMAL HIGH (ref 70–99)
Glucose-Capillary: 109 mg/dL — ABNORMAL HIGH (ref 70–99)
Glucose-Capillary: 122 mg/dL — ABNORMAL HIGH (ref 70–99)
Glucose-Capillary: 88 mg/dL (ref 70–99)

## 2020-03-19 NOTE — Progress Notes (Addendum)
Nutrition Follow-up  DOCUMENTATION CODES:   Not applicable  INTERVENTION:  Continue bolus TF regimen via PEG: -Provide 2 cartons (420m) Osmolite 1.5 TID @ 0800, 1200, 1700 -Provide 1 carton (2380m Osmolite 1.5 once daily at bedtime @ 2100 -Flush with 609mree water before and after each tube feeding bolus -Provide 45m34mosource TF TID -Provide Nutrisource fiber BID -Additional free water flushes currently 200ml55m  Tube feeding regimen provides 2605 kcals, 137 grams protein,1267ml 62m water (2947ml w66mflushes)  NUTRITION DIAGNOSIS:   Increased nutrient needs related to acute illness as evidenced by estimated needs. -- ongoing  GOAL:   Patient will meet greater than or equal to 90% of their needs -- met with TF  MONITOR:   Labs,Weight trends,TF tolerance,Skin,I & O's  REASON FOR ASSESSMENT:   Ventilator,Consult Enteral/tube feeding initiation and management  ASSESSMENT:   Pt admitted with anoxic brain injury 2/2 PEA arrest/pain syndrome 2/2 hypertonicity/spastic tetraplegia (presumed to be 2/2 combination of EtOH and BZD overdose with UDS positive for THC and BZDs). PMH includes HIV and EtOH use.  09/29 - intubated 10/07- vomiting, TF held, laterrestarted with recurrent vomiting 10/12-R/L inguinal hernia, partial SBO  10/13- trach 10/15-PEG 10/16 - tolerated TC for 9 hours 10/18->10/19 - off vent overnight 10/20- TC 11/15 - trach downsized to #4 Shiley flex CFS 12/06 - trach changed (same #4 Shiley flex CFS) 1/11 - trach changed (same #4 Shiley flex CFS) 2/7 - trach changed (same #4 Shiley flex CFS)  Pt remains nonverbal.    Discharge continues to be difficult due to no SNF bed offers; pt needs trach capable facility. Still awaiting approval of Medicaid and disability. Note pt's daughter is supposed to come visit pt on 2/21.    Pt NPO, continues to tolerate bolus TF via PEG per RN. No abdominal distention noted. Current TF regimen is as  follows: 2 cartons (474ml) O51mite 1.5 TID, 1 carton (237ml) Os59mte 1.5 once daily, 60ml free70mer flush before and after each tube feeding bolus + additional FWF of 200ml Q4H, 51m Prosou9mTF TID and Nutrisource fiber BID. Continue with current regimen.   UOP: 1050ml x 24 ho29m Medications: pepcid, folic acid, miralax, thiamine Labs: Na 148 (H), CBGs 110-124-127  Diet Order:   Diet Order    None      EDUCATION NEEDS:   Not appropriate for education at this time  Skin:  Skin Assessment: Skin Integrity Issues: Skin Integrity Issues:: Other (Comment) Stage II: N/A Stage III: N/A Other: puncture abdomen (PEG insertion site)  Last BM:  2/13  Height:   Ht Readings from Last 1 Encounters:  11/01/19 _0  (1.676 m)    Weight:   Wt Readings from Last 1 Encounters:  03/15/20 74 kg    BMI:  Body mass index is 26.33 kg/m.  Estimated Nutritional Needs:   Kcal:  2400-2600  Protein:  120-135 grams  Fluid:  >2L/d  Malikai Gut AveretLarkin Ina RD pager number and weekend/on-call pager number located in [place]

## 2020-03-19 NOTE — Plan of Care (Signed)
?  Problem: Clinical Measurements: ?Goal: Respiratory complications will improve ?Outcome: Progressing ?  ?Problem: Clinical Measurements: ?Goal: Cardiovascular complication will be avoided ?Outcome: Progressing ?  ?Problem: Nutrition: ?Goal: Adequate nutrition will be maintained ?Outcome: Progressing ?  ?Problem: Pain Managment: ?Goal: General experience of comfort will improve ?Outcome: Progressing ?  ?

## 2020-03-19 NOTE — Progress Notes (Signed)
[facility] PROGRESS NOTE  Ryan Orozco JGG:[id number] DOB: 1965-08-12 DOA: 11/01/2019 PCP: Default, Provider, MD       12/19/19                          03/15/20 OOB to chair   Status: Remains inpatient appropriate because:Altered mental status, Unsafe d/c plan and Inpatient level of care appropriate due to severity of illness. Patient newly diagnosed with anoxic brain injury  Dispo: The patient is from: Home              Anticipated d/c is to: SNF              Anticipated d/c date is: > 3 days              Patient currently is medically stable to d/c.  Barriers to discharge: No SNF bed offers.  Needs trach capable facility.  Still awaiting approval of Medicaid and disability.  No bed offers.   Code Status: DNR after d/w palliative team on 1/27 Family Communication: 2/11 updated dtr [name] via text to include picture of her father up in chair-she will be in [place] on 2/21 DVT prophylaxis: Subcutaneous heparin Vaccination status: Will order first dose Pfizer COVID-vaccine on 1/10; next week we will order pneumococcal and influenza vaccinations-2nd dose ordered 277  Foley catheter:  No  HPI: 55 year old male with HIV, chronic alcohol abuse who was presented to the emergency department after being found unresponsive outside a convinient store.  EMS found him pulseless in PEA, unknown downtime, successfully resuscitated and brought to the emergency department.  UDS positive for THC and benzodiazepines.  CT head unremarkable.  Failed extubation trials due to severe anoxic brain injury and had tracheostomy placed.  PEG placed on 10/15.  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  This has led to significant pain as well.  [name] was consulted and recommendations/input/orders highly appreciated.  She may also benefit from Botox injections to both hips to aid in treatment of spasticity.    Significant Hospital Events   9/29 Admit post PEA arrest. UDS positive for THC, benzo's. ETOH 177 10/05 EEG ongoing. Versed restarted overnight due to agitation. Nicardipine increased.  10/06 Developed tachypnea with WUA, no follow commands off sedation  10/07 Vomiting, TF held / restarted with recurrent vomiting 10/16 tracheostomy collar for 9 hours 10/18->10/19 off vent all night  10/20> TC 11/15 > downsized to #4 Shiley flex CFS  Subjective: Awake and alert.  Repeatedly sticking out tongue and smacking lips.  Appears to have dry mouth.  Smiled broadly when I asked him if he felt like he had critters in his mouth.  Objective: Vitals:   03/19/20 0415 03/19/20 0504  BP:  113/82  Pulse: 75 82  Resp: 18 18  Temp:  97.7 F (36.5 C)  SpO2: 95% 97%    Intake/Output Summary (Last 24 hours) at 03/19/2020 0756 Last data filed at 03/19/2020 5465 Gross per 24 hour  Intake --  Output 1050 ml  Net -1050 ml   Filed Weights   03/06/20 0500 03/14/20 0740 03/15/20 0742  Weight: 72 kg 74 kg 74 kg    Exam: General: Awake, calm in no acute distress Pulmonary: Lungs clear, FiO2 21% via humidified trach collar.  No secretions Cardiac: Heart sounds S1-S2, no peripheral edema, extremities warm to touch Abdomen: PEG tube  LBM 2/13 Neurological: Severe hypertonicity/spasticity bilateral lower extremities involving  the hip flexors and hamstrings;  keeps bilateral hips flexed -continues to have pain w/ attempts to extend either leg especially the right leg.  Nonpurposeful use of upper extremities.   Psychiatric: Awake and interactive but essentially nonverbal so unable to accurately assess orientation  Assessment/Plan: Acute problems: Anoxic brain injury 2/2 PEA arrest/pain syndrome secondary to hypertonicity/spastic tetraplegia:  -Presumed 2/2 combination alcohol and BZD overdose with UDS positive for THC and BZDs -Significant brain injury/spastic tetraplegia -needs long-term SNF -Continue  baclofen, gabapentin, scheduled oxycodone and Zanaflex for spastic tetraplegia -1/6 [name] with rehabilitative medicine performed Botox injection into the quadriceps and rectus femoris as well as the biceps femoris and hamstrings, semimembranosus and semitendinosus.  Unable to inject iliopsoas without EMG guidance w/o significant improvement and spasticity hypertonicity. -Continue bilateral UE WHO resting splints/PROM q 4 hours/KREG bed -Continue Seroquel  -Continue out of bed to chair with lift  Blister dorsum left foot  03/15/20  -Possibly pressure related -Appreciate wound care nurse assistance.  Superficial skin from blister debrided and Xeroform gauze applied to open wound and was covered with a foam dressing with recommendations to change the Xerofoam daily and the foam dressing every 3 days or as needed for soiling  Goals of care:  -Palliative care team reconsulted on 1/27 and after an extensive discussion daughter does realize that patient quite debilitated and would not survive a resuscitation therefore patient made a DNR  Chronic respiratory failure with inability to maintain patent airway requiring chronic tracheostomy tube/Recent Corynebacterium tracheobronchitis (resolved) -Continue #4 cuffless trach in place-trach changed out on 1/10 RT -Given altered mentation and inability to manage secretions PCCM states patient is not a candidate for elective decannulation  Fever/peri-PEG drainage positive for corynebacterium and rare Pseudomonas -PEG site with some drainage but no erythema, induration or pain -Suspect may have colonization noting recently had corynebacterium tracheobronchitis treated -continue bacitracin ointment  HIV:  -CD4 count of 124 October 2021 (had been as high as 250 Dec 2020)-as of today up to 259; viral load obtained on 1/15 was <20 -Dc'd Tivicay and Descovy infavor of Biktarvy to for eventual dc to SNF; per my discussion with infectious disease physician  [name] on 12/23 preferred regimen would be Descovy and Tivicay-she will contact the HIV pharmacist to attempt to obtain discount cards to make this a more affordable option.  Once this has been confirmed will transition back to Descovy and Tivicay -CD4 count on 12/23 up to 259 -ID recommends continuing prophylactic Bactrim an additional 3 months  Dysphagia 2/2 anoxic brain injury -Continue tube feeding per PEG -Given altered mentation patient not a candidate for further speech evaluations and will be permanently dependent on enteral tube feedings  Constipation -We will continue scheduled Colace, fiber supplements per tube as well as daily MiraLAX -Suspect requirement for narcotics and multiple muscle relaxers contributing to decreased GI motility and constipation symptoms  Protein calorie malnutrition nutrition Status: Nutrition Problem: Increased nutrient needs Etiology: acute illness Signs/Symptoms: estimated needs Interventions: Tube feeding bolus doses of Osmolite, Juven twice daily, Prosource TF 45 mL 3 times daily Estimated body mass index is 26.33 kg/m as calculated from the following:   Height as of this encounter: 5' 6"  (1.676 m).   Weight as of this encounter: 74 kg.   Multiple decubiti not POA Wound / Incision (Open or Dehisced) 11/17/19 Puncture Abdomen Left;Anterior;Upper G-tube insertion site  (Active)  Date First Assessed/Time First Assessed: 11/17/19 1646   Wound Type: Puncture  Location: Abdomen  Location Orientation: Left;Anterior;Upper  Wound Description (Comments): G-tube insertion site   Present on Admission: No    Assessments 11/17/2019  4:47 PM 03/18/2020 10:18 PM  Dressing Type Gauze (Comment);Tape dressing Gauze (Comment)  Dressing Changed New Changed  Dressing Status Clean;Dry;Intact Old drainage  Dressing Change Frequency PRN --  Site / Wound Assessment Clean;Dry;Pink Clean;Red  Peri-wound Assessment Intact Intact  Closure None --  Drainage Amount None  Scant  Drainage Description -- Sanguineous  Treatment Cleansed Cleansed     No Linked orders to display     Wound / Incision (Open or Dehisced) 03/16/20 Other (Comment) Foot Anterior;Left (Active)  Date First Assessed/Time First Assessed: 03/16/20 0800   Wound Type: (c) Other (Comment)  Location: Foot  Location Orientation: Anterior;Left  Present on Admission: No    Assessments 03/16/2020  8:00 AM 03/18/2020 10:18 PM  Dressing Type Other (Comment) Foam - Lift dressing to assess site every shift  Dressing Changed Changed --  Dressing Status Old drainage Clean;Intact;Dry  Dressing Change Frequency Daily --  Site / Wound Assessment Pink;Red Dressing in place / Unable to assess  Peri-wound Assessment Intact --  Drainage Amount Minimal --  Drainage Description Serosanguineous --  Treatment Cleansed --     No Linked orders to display      Other problems: Hypertension/grade 1 diastolic dysfunction:  -Continue amlodipine  -Echocardiogram this admission with preserved LVEF with evidence of grade   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   -Continue Keppra; levetiracetam level 35.9 on 11/12 -no further seizure activity since transitioned out of ICU setting therefore will discontinue seizure precaution  SIRS/Fever 2/2 recurrent enterococcus UTI -Patient developed mild elevation in temperature overnight to 1/13 associated with slight increase in respiratory secretions and congested sounding cough/abnormal pulmonary exam as well as tachycardia./14 respiratory status much improved and no further fevers. -Respiratory viral panel neg for COVID, influenza and RSV.   -UA slightly abnormal/cx c/w enterococcus UTI- started on nitrofurantoin (prior UTI treated w/ augmentin) has completed 3 days of treatment -X-ray with bilateral opacities concerning for early pneumonia but CT of the chest without pneumonia, broad-spectrum antibiotics dc'd 1/14 -Because of dehydration increased  frequency of free water from 200 cc every 4 hours to every 2 hours on 1/13- stopped on 1/15  Low-grade fever/abnormal urinalysis -2/8: Solitary T-max 100.5 overnight.  Likely related to atelectasis.  Obtain chest x-ray today that was unremarkable -As a precaution we will go ahead and obtain urinalysis and culture and blood cultures; electrolytes unremarkable, LDH normal at 178, no leukocytosis -Urinalysis abnormal with hazy appearance, trace leukocytes, rare bacteria and 11-20 WBCs-urine culture was never obtained and subsequently fever has resolved -Blood cultures are negative -Chest x-ray obtained on 2/14 unremarkable  Inguinal hernias -Evaluated by general surgery this admission. Documented as chronically incarcerated. Patient has been clinically stable and tolerating tube feedings and having bowel movements so unless patient obstructed would not be considered a candidate for repair. Even if obstructed consulting surgeon was not sure he would be a candidate for hernia repair regardless  Constipation -No documented bowel movement since 1/13 -We will continue scheduled Colace, fiber supplements per tube -Add daily MiraLAX -Given one-time dose milk of magnesia on 1/20-no documented bowel movement on flowsheet -Suspect requirement for narcotics and multiple muscle relaxers contributing to decreased GI motility and constipation symptoms  Goals of care:  -Palliative care was involved during this hospitalization.   -Goals of care were discussed. remains full code.  -Palliative care recommended outpatient follow-up with palliative providers. -Reconsult Palliative  medicine 1/27   Data Reviewed: Basic Metabolic Panel: Recent Labs  Lab 03/12/20 0919 03/15/20 1021 03/18/20 0135  NA 144 143 148*  K 3.9 3.9 4.0  CL 106 106 110  CO2 26 25 26   GLUCOSE 96 112* 99  BUN 16 20 23*  CREATININE 0.70 0.65 0.67  CALCIUM 10.9* 10.6* 10.8*  MG  --  1.9 2.2  PHOS  --  3.8 3.5   Liver Function  Tests: Recent Labs  Lab 03/12/20 0919 03/15/20 1021 03/18/20 0135  AST 25 26 26   ALT 18 20 21   ALKPHOS 93 96 87  BILITOT 0.5 0.6 0.6  PROT 8.0 8.4* 8.4*  ALBUMIN 3.5 3.7 3.6   No results for input(s): LIPASE, AMYLASE in the last 168 hours. No results for input(s): AMMONIA in the last 168 hours. CBC: Recent Labs  Lab 03/12/20 0919 03/15/20 1021 03/18/20 0135  WBC 4.6 3.8* 5.3  NEUTROABS 3.3 2.6 3.1  HGB 14.2 14.1 14.8  HCT 42.9 45.5 45.6  MCV 94.1 96.8 96.4  PLT 179 145* 198   Cardiac Enzymes: No results for input(s): CKTOTAL, CKMB, CKMBINDEX, TROPONINI in the last 168 hours. BNP (last 3 results) Recent Labs    02/15/20 0433  BNP 3.9    ProBNP (last 3 results) No results for input(s): PROBNP in the last 8760 hours.  CBG: Recent Labs  Lab 03/18/20 0822 03/18/20 1236 03/18/20 1617 03/18/20 1955 03/18/20 2301  GLUCAP 94 110* 124* 127* 166*    Recent Results (from the past 240 hour(s))  Culture, blood (Routine X 2) w Reflex to ID Panel     Status: None   Collection Time: 03/12/20  9:19 AM   Specimen: BLOOD LEFT FOREARM  Result Value Ref Range Status   Specimen Description BLOOD LEFT FOREARM  Final   Special Requests   Final    BOTTLES DRAWN AEROBIC AND ANAEROBIC Blood Culture results may not be optimal due to an inadequate volume of blood received in culture bottles   Culture   Final    NO GROWTH 5 DAYS Performed at [facility], [place] [address] [place], [place] [address]    Report Status 03/17/2020 FINAL  Final  Culture, blood (Routine X 2) w Reflex to ID Panel     Status: None   Collection Time: 03/12/20  9:22 AM   Specimen: BLOOD  Result Value Ref Range Status   Specimen Description BLOOD LEFT ANTECUBITAL  Final   Special Requests   Final    BOTTLES DRAWN AEROBIC AND ANAEROBIC Blood Culture results may not be optimal due to an inadequate volume of blood received in culture bottles   Culture   Final    NO GROWTH 5 DAYS Performed at [facility], [place] [address] [place], [place] [address]    Report Status 03/17/2020 FINAL  Final     Studies: DG CHEST PORT 1 VIEW  Result Date: 03/18/2020 CLINICAL DATA:  Lurline Idol present.  Cardiac arrest. EXAM: PORTABLE CHEST 1 VIEW COMPARISON:  03/12/2020 FINDINGS: Patient has tracheostomy in place, tip unchanged in position. Heart size is normal accounting for position. Lungs are clear. No edema. IMPRESSION: No evidence for acute  abnormality. Electronically Signed   By: Nolon Nations M.D.   On: 03/18/2020 08:08    Scheduled Meds: . acetaminophen (TYLENOL) oral liquid 160 mg/5 mL  650 mg Per Tube Q6H  . amLODipine  10 mg Per Tube Daily  . bacitracin   Topical BID  .  baclofen  30 mg Per Tube TID  . chlorhexidine gluconate (MEDLINE KIT)  15 mL Mouth Rinse BID  . Darunavir-Cobicisctat-Emtricitabine-Tenofovir Alafenamide  1 tablet Oral Q breakfast  . famotidine  10.4 mg Per Tube BID  . feeding supplement (OSMOLITE 1.5 CAL)  237 mL Per Tube Q24H  . feeding supplement (OSMOLITE 1.5 CAL)  474 mL Per Tube TID  . feeding supplement (PROSource TF)  45 mL Per Tube TID  . fiber  1 packet Per Tube BID  . folic acid  1 mg Per Tube Daily  . free water  200 mL Per Tube Q4H  . gabapentin  200 mg Per Tube Q8H  . heparin injection (subcutaneous)  5,000 Units Subcutaneous Q8H  . levETIRAcetam  1,500 mg Per Tube BID  . magic mouthwash w/lidocaine  5 mL Oral QID  . Muscle Rub   Topical QID  . oxyCODONE  5 mg Per Tube Q6H  . polyethylene glycol  17 g Per Tube QHS  . QUEtiapine  12.5 mg Per Tube QHS  . scopolamine  1 patch Transdermal Q72H  . sulfamethoxazole-trimethoprim  1 tablet Per Tube Once per day on Mon Wed Fri  . thiamine  100 mg Per Tube Daily  . tiZANidine  6 mg Per Tube Q6H   Continuous Infusions: . dextrose      Active Problems:   Cardiac arrest ([place])   Community acquired pneumonia of left upper lobe of lung   Anoxic brain injury ([place])   Alcohol abuse   Acute respiratory  failure with hypoxia (HCC)   Vomiting   Pressure injury of skin   Small bowel obstruction (HCC)   Palliative care encounter   Bowel obstruction (HCC)   Chronic respiratory failure (HCC)   Diastolic dysfunction   Tracheostomy dependent (HCC)   Dysphagia   Protein-calorie malnutrition (HCC)   Seizures (HCC)   Bilateral recurrent inguinal hernia   Muscle spasticity   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HCC)   Tracheobronchitis   Sequela of Corynebacterium infection   Abnormal urine   Urinary tract infection due to Enterococcus   Fever   Consultants:  PCCM  Palliative medicine  General surgery  Neurology  Interventional radiology  Procedures:  EEG  Echocardiogram  Tracheostomy  Antibiotics: Anti-infectives (From admission, onward)   Start     Dose/Rate Route Frequency Ordered Stop   02/17/20 1100  nitrofurantoin (FURADANTIN) 25 MG/5ML suspension 100 mg        100 mg Per Tube Every 12 hours 02/17/20 1006 02/20/20 0924   02/15/20 1000  vancomycin (VANCOCIN) IVPB 1000 mg/200 mL premix  Status:  Discontinued        1,000 mg 200 mL/hr over 60 Minutes Intravenous Every 12 hours 02/14/20 2226 02/16/20 0742   02/14/20 2245  piperacillin-tazobactam (ZOSYN) IVPB 3.375 g  Status:  Discontinued        3.375 g 12.5 mL/hr over 240 Minutes Intravenous Every 8 hours 02/14/20 2226 02/16/20 0742   02/14/20 2245  vancomycin (VANCOREADY) IVPB 1500 mg/300 mL        1,500 mg 150 mL/hr over 120 Minutes Intravenous  Once 02/14/20 2226 02/15/20 0739   02/01/20 2200  amoxicillin-clavulanate (AUGMENTIN) 250-62.5 MG/5ML suspension 500 mg        500 mg Per Tube Every 8 hours 02/01/20 1343 02/06/20 1333   01/31/20 1015  levofloxacin (LEVAQUIN) 25 MG/ML solution 500 mg  Status:  Discontinued        500 mg Per Tube Daily 01/31/20 0919  02/01/20 1343   01/31/20 1000  cefTRIAXone (ROCEPHIN) 1 g in sodium chloride 0.9 % 100 mL IVPB  Status:  Discontinued        1 g 200 mL/hr over 30  Minutes Intravenous Every 24 hours 01/31/20 0905 01/31/20 0919   01/19/20 2200  linezolid (ZYVOX) tablet 600 mg        600 mg Per Tube Every 12 hours 01/19/20 1534 01/25/20 2019   01/19/20 1100  levofloxacin (LEVAQUIN) tablet 500 mg  Status:  Discontinued        500 mg Per Tube Daily 01/19/20 1004 01/19/20 1534   12/19/19 0930  Darunavir-Cobicisctat-Emtricitabine-Tenofovir Alafenamide (SYMTUZA) 800-150-200-10 MG TABS 1 tablet        1 tablet Oral Daily with breakfast 12/19/19 0840     12/13/19 1000  bictegravir-emtricitabine-tenofovir AF (BIKTARVY) 50-200-25 MG per tablet 1 tablet  Status:  Discontinued        1 tablet Oral Daily 12/12/19 1413 12/19/19 0840   11/17/19 1548  ceFAZolin (ANCEF) 2-4 GM/100ML-% IVPB       Note to Pharmacy: Domenick Bookbinder   : cabinet override      11/17/19 1548 11/18/19 0359   11/16/19 1515  ceFAZolin (ANCEF) IVPB 2g/100 mL premix        2 g 200 mL/hr over 30 Minutes Intravenous To Radiology 11/16/19 1511 11/17/19 1625   11/15/19 0900  sulfamethoxazole-trimethoprim (BACTRIM DS) 800-160 MG per tablet 1 tablet        1 tablet Per Tube Once per day on Mon Wed Fri 11/13/19 1906     11/13/19 1615  dolutegravir (TIVICAY) tablet 50 mg  Status:  Discontinued        50 mg Oral Daily 11/13/19 1521 12/12/19 1413   11/13/19 1615  emtricitabine-tenofovir AF (DESCOVY) 200-25 MG per tablet 1 tablet  Status:  Discontinued        1 tablet Per Tube Daily 11/13/19 1521 12/12/19 1413   11/13/19 1530  sulfamethoxazole-trimethoprim (BACTRIM DS) 800-160 MG per tablet 1 tablet  Status:  Discontinued        1 tablet Oral Once per day on Mon Wed Fri 11/13/19 1441 11/13/19 1906   11/13/19 1500  bictegravir-emtricitabine-tenofovir AF (BIKTARVY) 50-200-25 MG per tablet 1 tablet  Status:  Discontinued        1 tablet Oral Daily 11/13/19 1403 11/13/19 1521   11/10/19 1000  erythromycin 250 mg in sodium chloride 0.9 % 100 mL IVPB        250 mg 100 mL/hr over 60 Minutes Intravenous Every 8  hours 11/10/19 0907 11/11/19 2000   11/07/19 1200  ceFEPIme (MAXIPIME) 2 g in sodium chloride 0.9 % 100 mL IVPB        2 g 200 mL/hr over 30 Minutes Intravenous Every 8 hours 11/07/19 1013 11/13/19 2127   11/03/19 1200  cefTRIAXone (ROCEPHIN) 2 g in sodium chloride 0.9 % 100 mL IVPB        2 g 200 mL/hr over 30 Minutes Intravenous Every 24 hours 11/03/19 0922 11/05/19 1427   11/02/19 0800  vancomycin (VANCOREADY) IVPB 750 mg/150 mL  Status:  Discontinued        750 mg 150 mL/hr over 60 Minutes Intravenous Every 12 hours 11/01/19 1935 11/02/19 1105   11/02/19 0600  piperacillin-tazobactam (ZOSYN) IVPB 3.375 g  Status:  Discontinued        3.375 g 12.5 mL/hr over 240 Minutes Intravenous Every 8 hours 11/02/19 0311 11/03/19 0920   11/01/19 2200  ceFEPIme (MAXIPIME) 2 g in sodium chloride 0.9 % 100 mL IVPB  Status:  Discontinued        2 g 200 mL/hr over 30 Minutes Intravenous Every 12 hours 11/01/19 2143 11/02/19 0311   11/01/19 1915  vancomycin (VANCOREADY) IVPB 1500 mg/300 mL        1,500 mg 150 mL/hr over 120 Minutes Intravenous  Once 11/01/19 1909 11/01/19 2147   11/01/19 1845  cefTRIAXone (ROCEPHIN) 1 g in sodium chloride 0.9 % 100 mL IVPB        1 g 200 mL/hr over 30 Minutes Intravenous  Once 11/01/19 1842 11/01/19 2051   11/01/19 1845  azithromycin (ZITHROMAX) 500 mg in sodium chloride 0.9 % 250 mL IVPB        500 mg 250 mL/hr over 60 Minutes Intravenous  Once 11/01/19 1842 11/01/19 2051       Time spent: 20 minutes    Erin Hearing ANP  [facility]  7 am-330 pm/M-F for direct patient care and secure chat Please refer to [facility] for contact information 03/19/2020, 7:56 AM  LOS: 139 days

## 2020-03-20 LAB — GLUCOSE, CAPILLARY
Glucose-Capillary: 94 mg/dL (ref 70–99)
Glucose-Capillary: 102 mg/dL — ABNORMAL HIGH (ref 70–99)
Glucose-Capillary: 157 mg/dL — ABNORMAL HIGH (ref 70–99)
Glucose-Capillary: 69 mg/dL — ABNORMAL LOW (ref 70–99)
Glucose-Capillary: 221 mg/dL — ABNORMAL HIGH (ref 70–99)
Glucose-Capillary: 122 mg/dL — ABNORMAL HIGH (ref 70–99)

## 2020-03-20 NOTE — Progress Notes (Signed)
[facility] PROGRESS NOTE  Ryan Orozco:[id number] DOB: 25-Dec-1965 DOA: 11/01/2019 PCP: Default, Provider, MD       12/19/19                          03/15/20 OOB to chair   Status: Remains inpatient appropriate because:Altered mental status, Unsafe d/c plan and Inpatient level of care appropriate due to severity of illness. Patient newly diagnosed with anoxic brain injury  Dispo: The patient is from: Home              Anticipated d/c is to: SNF              Anticipated d/c date is: > 3 days              Patient currently is medically stable to d/c.  Barriers to discharge: No SNF bed offers.  Needs trach capable facility.  Still awaiting approval of Medicaid and disability.  No bed offers.   Code Status: DNR after d/w palliative team on 1/27 Family Communication: 2/11 updated dtr [name] via text to include picture of her father up in chair-she will be in [place] on 2/21 DVT prophylaxis: Subcutaneous heparin Vaccination status: Will order first dose Pfizer COVID-vaccine on 1/10; next week we will order pneumococcal and influenza vaccinations-2nd dose ordered 277  Foley catheter:  No  HPI: 55 year old male with HIV, chronic alcohol abuse who was presented to the emergency department after being found unresponsive outside a convinient store.  EMS found him pulseless in PEA, unknown downtime, successfully resuscitated and brought to the emergency department.  UDS positive for THC and benzodiazepines.  CT head unremarkable.  Failed extubation trials due to severe anoxic brain injury and had tracheostomy placed.  PEG placed on 10/15.  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  This has led to significant pain as well.  [name] was consulted and recommendations/input/orders highly appreciated.  She may also benefit from Botox injections to both hips to aid in treatment of spasticity.    Significant Hospital Events   9/29 Admit post PEA arrest. UDS positive for THC, benzo's. ETOH 177 10/05 EEG ongoing. Versed restarted overnight due to agitation. Nicardipine increased.  10/06 Developed tachypnea with WUA, no follow commands off sedation  10/07 Vomiting, TF held / restarted with recurrent vomiting 10/16 tracheostomy collar for 9 hours 10/18->10/19 off vent all night  10/20> TC 11/15 > downsized to #4 Shiley flex CFS  Subjective: Awake, smiling during interaction.  Turn TV on and eventually found channel without patient may like.  At that time patient had fallen back to sleep.  Objective: Vitals:   03/20/20 0406 03/20/20 0551  BP:  (!) 146/119  Pulse: 73 86  Resp: 18   Temp:  98 F (36.7 C)  SpO2: 95% (!) 82%    Intake/Output Summary (Last 24 hours) at 03/20/2020 0809 Last data filed at 03/19/2020 2112 Gross per 24 hour  Intake --  Output 800 ml  Net -800 ml   Filed Weights   03/06/20 0500 03/14/20 0740 03/15/20 0742  Weight: 72 kg 74 kg 74 kg    Exam: General: Awake, pleasant, no acute distress Pulmonary: Anterior lung sounds clear FiO2 21% via humidified trach collar without excessive secretions Cardiac: Normal heart sounds, pulse regular, extremity without peripheral edema Abdomen: PEG tube for feedings and medications, abdomen soft with normoactive bowel sounds.  LBM 2/15 Neurological: Severe hypertonicity/spasticity  bilateral lower extremities involving the hip flexors and hamstrings;  keeps bilateral hips flexed -continues to have pain w/ attempts to extend either leg especially the right leg.  Nonpurposeful use of upper extremities.   Psychiatric: Awake and alert during interaction.  Smiling and laughing.  Unintelligible speech Skin: Wound on dorsum of left foot remains clean with pink granular base and no periwound edema or drainage  Assessment/Plan: Acute problems: Anoxic brain injury 2/2 PEA arrest/pain syndrome secondary to  hypertonicity/spastic tetraplegia:  -Presumed 2/2 combination alcohol and BZD overdose with UDS positive for THC and BZDs -Significant brain injury/spastic tetraplegia -needs long-term SNF -Continue baclofen, gabapentin, scheduled oxycodone and Zanaflex for spastic tetraplegia -1/6 [name] with rehabilitative medicine performed Botox injection into the quadriceps and rectus femoris as well as the biceps femoris and hamstrings, semimembranosus and semitendinosus.  Unable to inject iliopsoas without EMG guidance w/o significant improvement and spasticity hypertonicity. -Continue bilateral UE WHO resting splints/PROM q 4 hours/KREG bed -Continue Seroquel  -Continue out of bed to chair with lift  Blister dorsum left foot  03/15/20  -Possibly pressure related -Appreciate wound care nurse assistance.  Superficial skin from blister debrided and Xeroform gauze applied to open wound and was covered with a foam dressing with recommendations to change the Xerofoam daily and the foam dressing every 3 days or as needed for soiling  Goals of care:  -Palliative care team reconsulted on 1/27 and after an extensive discussion daughter does realize that patient quite debilitated and would not survive a resuscitation therefore patient made a DNR  Chronic respiratory failure with inability to maintain patent airway requiring chronic tracheostomy tube/Recent Corynebacterium tracheobronchitis (resolved) -Continue #4 cuffless trach in place-trach changed out on 1/10 RT -Given altered mentation and inability to manage secretions PCCM states patient is not a candidate for elective decannulation  Fever/peri-PEG drainage positive for corynebacterium and rare Pseudomonas -PEG site with some drainage but no erythema, induration or pain -Suspect may have colonization noting recently had corynebacterium tracheobronchitis treated -continue bacitracin ointment  HIV:  -CD4 count of 124 October 2021 (had been as high as  250 Dec 2020)-as of today up to 259; viral load obtained on 1/15 was <20 -Dc'd Tivicay and Descovy infavor of Biktarvy to for eventual dc to SNF; per my discussion with infectious disease physician [name] on 12/23 preferred regimen would be Descovy and Tivicay-she will contact the HIV pharmacist to attempt to obtain discount cards to make this a more affordable option.  Once this has been confirmed will transition back to Descovy and Tivicay -CD4 count on 12/23 up to 259 -ID recommends continuing prophylactic Bactrim an additional 3 months (last dose 04/24/2020)  Dysphagia 2/2 anoxic brain injury -Continue tube feeding per PEG -Given altered mentation patient not a candidate for further speech evaluations and will be permanently dependent on enteral tube feedings  Constipation -We will continue scheduled Colace, fiber supplements per tube as well as daily MiraLAX -Suspect requirement for narcotics and multiple muscle relaxers contributing to decreased GI motility and constipation symptoms  Protein calorie malnutrition nutrition Status: Nutrition Problem: Increased nutrient needs Etiology: acute illness Signs/Symptoms: estimated needs Interventions: Tube feeding bolus doses of Osmolite, Juven twice daily, Prosource TF 45 mL 3 times daily Estimated body mass index is 26.33 kg/m as calculated from the following:   Height as of this encounter: _0  (1.676 m).   Weight as of this encounter: 74 kg.   Multiple decubiti not POA Wound / Incision (Open or Dehisced) 11/17/19 Puncture Abdomen  Left;Anterior;Upper G-tube insertion site  (Active)  Date First Assessed/Time First Assessed: 11/17/19 1646   Wound Type: Puncture  Location: Abdomen  Location Orientation: Left;Anterior;Upper  Wound Description (Comments): G-tube insertion site   Present on Admission: No    Assessments 11/17/2019  4:47 PM 03/19/2020  8:54 PM  Dressing Type Gauze (Comment);Tape dressing Gauze (Comment)  Dressing Changed New  Changed  Dressing Status Clean;Dry;Intact Dry;Intact;Old drainage  Dressing Change Frequency PRN Daily  Site / Wound Assessment Clean;Dry;Pink Clean;Red  Peri-wound Assessment Intact Intact  Closure None --  Drainage Amount None Scant  Drainage Description -- Purulent  Treatment Cleansed --     No Linked orders to display     Wound / Incision (Open or Dehisced) 03/16/20 Other (Comment) Foot Anterior;Left (Active)  Date First Assessed/Time First Assessed: 03/16/20 0800   Wound Type: (c) Other (Comment)  Location: Foot  Location Orientation: Anterior;Left  Present on Admission: No    Assessments 03/16/2020  8:00 AM 03/19/2020  8:00 AM  Dressing Type Other (Comment) Foam - Lift dressing to assess site every shift  Dressing Changed Changed --  Dressing Status Old drainage Clean;Dry;Intact  Dressing Change Frequency Daily Daily  Site / Wound Assessment Pink;Red Dressing in place / Unable to assess  Peri-wound Assessment Intact --  Drainage Amount Minimal None  Drainage Description Serosanguineous --  Treatment Cleansed --     No Linked orders to display      Other problems: Hypertension/grade 1 diastolic dysfunction:  -Continue amlodipine  -Echocardiogram this admission with preserved LVEF with evidence of grade   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   -Continue Keppra; levetiracetam level 35.9 on 11/12 -no further seizure activity since transitioned out of ICU setting therefore will discontinue seizure precaution  SIRS/Fever 2/2 recurrent enterococcus UTI -Patient developed mild elevation in temperature overnight to 1/13 associated with slight increase in respiratory secretions and congested sounding cough/abnormal pulmonary exam as well as tachycardia./14 respiratory status much improved and no further fevers. -Respiratory viral panel neg for COVID, influenza and RSV.   -UA slightly abnormal/cx c/w enterococcus UTI- started on nitrofurantoin (prior UTI  treated w/ augmentin) has completed 3 days of treatment -X-ray with bilateral opacities concerning for early pneumonia but CT of the chest without pneumonia, broad-spectrum antibiotics dc'd 1/14 -Because of dehydration increased frequency of free water from 200 cc every 4 hours to every 2 hours on 1/13- stopped on 1/15  Low-grade fever/abnormal urinalysis -2/8: Solitary T-max 100.5 overnight.  Likely related to atelectasis.  Obtain chest x-ray today that was unremarkable -As a precaution we will go ahead and obtain urinalysis and culture and blood cultures; electrolytes unremarkable, LDH normal at 178, no leukocytosis -Urinalysis abnormal with hazy appearance, trace leukocytes, rare bacteria and 11-20 WBCs-urine culture was never obtained and subsequently fever has resolved -Blood cultures are negative -Chest x-ray obtained on 2/14 unremarkable  Inguinal hernias -Evaluated by general surgery this admission. Documented as chronically incarcerated. Patient has been clinically stable and tolerating tube feedings and having bowel movements so unless patient obstructed would not be considered a candidate for repair. Even if obstructed consulting surgeon was not sure he would be a candidate for hernia repair regardless  Constipation -No documented bowel movement since 1/13 -We will continue scheduled Colace, fiber supplements per tube -Add daily MiraLAX -Given one-time dose milk of magnesia on 1/20-no documented bowel movement on flowsheet -Suspect requirement for narcotics and multiple muscle relaxers contributing to decreased GI motility and constipation symptoms  Goals of care:  -  Palliative care was involved during this hospitalization.   -Goals of care were discussed. remains full code.  -Palliative care recommended outpatient follow-up with palliative providers. -Reconsult Palliative medicine 1/27   Data Reviewed: Basic Metabolic Panel: Recent Labs  Lab 03/15/20 1021 03/18/20 0135   NA 143 148*  K 3.9 4.0  CL 106 110  CO2 25 26  GLUCOSE 112* 99  BUN 20 23*  CREATININE 0.65 0.67  CALCIUM 10.6* 10.8*  MG 1.9 2.2  PHOS 3.8 3.5   Liver Function Tests: Recent Labs  Lab 03/15/20 1021 03/18/20 0135  AST 26 26  ALT 20 21  ALKPHOS 96 87  BILITOT 0.6 0.6  PROT 8.4* 8.4*  ALBUMIN 3.7 3.6   No results for input(s): LIPASE, AMYLASE in the last 168 hours. No results for input(s): AMMONIA in the last 168 hours. CBC: Recent Labs  Lab 03/15/20 1021 03/18/20 0135  WBC 3.8* 5.3  NEUTROABS 2.6 3.1  HGB 14.1 14.8  HCT 45.5 45.6  MCV 96.8 96.4  PLT 145* 198   Cardiac Enzymes: No results for input(s): CKTOTAL, CKMB, CKMBINDEX, TROPONINI in the last 168 hours. BNP (last 3 results) Recent Labs    02/15/20 0433  BNP 3.9    ProBNP (last 3 results) No results for input(s): PROBNP in the last 8760 hours.  CBG: Recent Labs  Lab 03/19/20 0742 03/19/20 1204 03/19/20 1548 03/19/20 2016 03/19/20 2326  GLUCAP 103* 140* 109* 122* 88    Recent Results (from the past 240 hour(s))  Culture, blood (Routine X 2) w Reflex to ID Panel     Status: None   Collection Time: 03/12/20  9:19 AM   Specimen: BLOOD LEFT FOREARM  Result Value Ref Range Status   Specimen Description BLOOD LEFT FOREARM  Final   Special Requests   Final    BOTTLES DRAWN AEROBIC AND ANAEROBIC Blood Culture results may not be optimal due to an inadequate volume of blood received in culture bottles   Culture   Final    NO GROWTH 5 DAYS Performed at [facility], [place] [address], [place], [place] [address]    Report Status 03/17/2020 FINAL  Final  Culture, blood (Routine X 2) w Reflex to ID Panel     Status: None   Collection Time: 03/12/20  9:22 AM   Specimen: BLOOD  Result Value Ref Range Status   Specimen Description BLOOD LEFT ANTECUBITAL  Final   Special Requests   Final    BOTTLES DRAWN AEROBIC AND ANAEROBIC Blood Culture results may not be optimal due to an inadequate volume of  blood received in culture bottles   Culture   Final    NO GROWTH 5 DAYS Performed at [facility], [place] [address], [place], [place] [address]    Report Status 03/17/2020 FINAL  Final     Studies: No results found.  Scheduled Meds: . acetaminophen (TYLENOL) oral liquid 160 mg/5 mL  650 mg Per Tube Q6H  . amLODipine  10 mg Per Tube Daily  . bacitracin   Topical BID  . baclofen  30 mg Per Tube TID  . chlorhexidine gluconate (MEDLINE KIT)  15 mL Mouth Rinse BID  . Darunavir-Cobicisctat-Emtricitabine-Tenofovir Alafenamide  1 tablet Oral Q breakfast  . famotidine  10.4 mg Per Tube BID  . feeding supplement (OSMOLITE 1.5 CAL)  237 mL Per Tube Q24H  . feeding supplement (OSMOLITE 1.5 CAL)  474 mL Per Tube TID  . feeding supplement (PROSource TF)  45 mL Per Tube TID  . fiber  1 packet Per Tube BID  . folic acid  1 mg Per Tube Daily  . free water  200 mL Per Tube Q4H  . gabapentin  200 mg Per Tube Q8H  . heparin injection (subcutaneous)  5,000 Units Subcutaneous Q8H  . levETIRAcetam  1,500 mg Per Tube BID  . magic mouthwash w/lidocaine  5 mL Oral QID  . Muscle Rub   Topical QID  . oxyCODONE  5 mg Per Tube Q6H  . polyethylene glycol  17 g Per Tube QHS  . QUEtiapine  12.5 mg Per Tube QHS  . scopolamine  1 patch Transdermal Q72H  . sulfamethoxazole-trimethoprim  1 tablet Per Tube Once per day on Mon Wed Fri  . thiamine  100 mg Per Tube Daily  . tiZANidine  6 mg Per Tube Q6H   Continuous Infusions:   Active Problems:   Cardiac arrest [facility])   Community acquired pneumonia of left upper lobe of lung   Anoxic brain injury ([place])   Alcohol abuse   Acute respiratory failure with hypoxia ([facility])   Vomiting   Pressure injury of skin   Small bowel obstruction ([place])   Palliative care encounter   Bowel obstruction (HCC)   Chronic respiratory failure (H[facility])   Diastolic dysfunction   Tracheostomy dependent ([place])   Dysphagia   Protein-calorie malnutrition ([place])   Seizures ([place])    Bilateral recurrent inguinal hernia   Muscle spasticity   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HCC)   Tracheobronchitis   Sequela of Corynebacterium infection   Abnormal urine   Urinary tract infection due to Enterococcus   Fever   Consultants:  PCCM  Palliative medicine  General surgery  Neurology  Interventional radiology  Procedures:  EEG  Echocardiogram  Tracheostomy  Antibiotics: Anti-infectives (From admission, onward)   Start     Dose/Rate Route Frequency Ordered Stop   02/17/20 1100  nitrofurantoin (FURADANTIN) 25 MG/5ML suspension 100 mg        100 mg Per Tube Every 12 hours 02/17/20 1006 02/20/20 0924   02/15/20 1000  vancomycin (VANCOCIN) IVPB 1000 mg/200 mL premix  Status:  Discontinued        1,000 mg 200 mL/hr over 60 Minutes Intravenous Every 12 hours 02/14/20 2226 02/16/20 0742   02/14/20 2245  piperacillin-tazobactam (ZOSYN) IVPB 3.375 g  Status:  Discontinued        3.375 g 12.5 mL/hr over 240 Minutes Intravenous Every 8 hours 02/14/20 2226 02/16/20 0742   02/14/20 2245  vancomycin (VANCOREADY) IVPB 1500 mg/300 mL        1,500 mg 150 mL/hr over 120 Minutes Intravenous  Once 02/14/20 2226 02/15/20 0739   02/01/20 2200  amoxicillin-clavulanate (AUGMENTIN) 250-62.5 MG/5ML suspension 500 mg        500 mg Per Tube Every 8 hours 02/01/20 1343 02/06/20 1333   01/31/20 1015  levofloxacin (LEVAQUIN) 25 MG/ML solution 500 mg  Status:  Discontinued        500 mg Per Tube Daily 01/31/20 0919 02/01/20 1343   01/31/20 1000  cefTRIAXone (ROCEPHIN) 1 g in sodium chloride 0.9 % 100 mL IVPB  Status:  Discontinued        1 g 200 mL/hr over 30 Minutes Intravenous Every 24 hours 01/31/20 0905 01/31/20 0919   01/19/20 2200  linezolid (ZYVOX) tablet 600 mg        600 mg Per Tube Every 12 hours 01/19/20 1534 01/25/20 2019   01/19/20  1100  levofloxacin (LEVAQUIN) tablet 500 mg  Status:  Discontinued        500 mg Per Tube Daily 01/19/20 1004 01/19/20 1534    12/19/19 0930  Darunavir-Cobicisctat-Emtricitabine-Tenofovir Alafenamide (SYMTUZA) 800-150-200-10 MG TABS 1 tablet        1 tablet Oral Daily with breakfast 12/19/19 0840     12/13/19 1000  bictegravir-emtricitabine-tenofovir AF (BIKTARVY) 50-200-25 MG per tablet 1 tablet  Status:  Discontinued        1 tablet Oral Daily 12/12/19 1413 12/19/19 0840   11/17/19 1548  ceFAZolin (ANCEF) 2-4 GM/100ML-% IVPB       Note to Pharmacy: Domenick Bookbinder   : cabinet override      11/17/19 1548 11/18/19 0359   11/16/19 1515  ceFAZolin (ANCEF) IVPB 2g/100 mL premix        2 g 200 mL/hr over 30 Minutes Intravenous To Radiology 11/16/19 1511 11/17/19 1625   11/15/19 0900  sulfamethoxazole-trimethoprim (BACTRIM DS) 800-160 MG per tablet 1 tablet        1 tablet Per Tube Once per day on Mon Wed Fri 11/13/19 1906     11/13/19 1615  dolutegravir (TIVICAY) tablet 50 mg  Status:  Discontinued        50 mg Oral Daily 11/13/19 1521 12/12/19 1413   11/13/19 1615  emtricitabine-tenofovir AF (DESCOVY) 200-25 MG per tablet 1 tablet  Status:  Discontinued        1 tablet Per Tube Daily 11/13/19 1521 12/12/19 1413   11/13/19 1530  sulfamethoxazole-trimethoprim (BACTRIM DS) 800-160 MG per tablet 1 tablet  Status:  Discontinued        1 tablet Oral Once per day on Mon Wed Fri 11/13/19 1441 11/13/19 1906   11/13/19 1500  bictegravir-emtricitabine-tenofovir AF (BIKTARVY) 50-200-25 MG per tablet 1 tablet  Status:  Discontinued        1 tablet Oral Daily 11/13/19 1403 11/13/19 1521   11/10/19 1000  erythromycin 250 mg in sodium chloride 0.9 % 100 mL IVPB        250 mg 100 mL/hr over 60 Minutes Intravenous Every 8 hours 11/10/19 0907 11/11/19 2000   11/07/19 1200  ceFEPIme (MAXIPIME) 2 g in sodium chloride 0.9 % 100 mL IVPB        2 g 200 mL/hr over 30 Minutes Intravenous Every 8 hours 11/07/19 1013 11/13/19 2127   11/03/19 1200  cefTRIAXone (ROCEPHIN) 2 g in sodium chloride 0.9 % 100 mL IVPB        2 g 200 mL/hr over 30  Minutes Intravenous Every 24 hours 11/03/19 0922 11/05/19 1427   11/02/19 0800  vancomycin (VANCOREADY) IVPB 750 mg/150 mL  Status:  Discontinued        750 mg 150 mL/hr over 60 Minutes Intravenous Every 12 hours 11/01/19 1935 11/02/19 1105   11/02/19 0600  piperacillin-tazobactam (ZOSYN) IVPB 3.375 g  Status:  Discontinued        3.375 g 12.5 mL/hr over 240 Minutes Intravenous Every 8 hours 11/02/19 0311 11/03/19 0920   11/01/19 2200  ceFEPIme (MAXIPIME) 2 g in sodium chloride 0.9 % 100 mL IVPB  Status:  Discontinued        2 g 200 mL/hr over 30 Minutes Intravenous Every 12 hours 11/01/19 2143 11/02/19 0311   11/01/19 1915  vancomycin (VANCOREADY) IVPB 1500 mg/300 mL        1,500 mg 150 mL/hr over 120 Minutes Intravenous  Once 11/01/19 1909 11/01/19 2147   11/01/19 1845  cefTRIAXone (  ROCEPHIN) 1 g in sodium chloride 0.9 % 100 mL IVPB        1 g 200 mL/hr over 30 Minutes Intravenous  Once 11/01/19 1842 11/01/19 2051   11/01/19 1845  azithromycin (ZITHROMAX) 500 mg in sodium chloride 0.9 % 250 mL IVPB        500 mg 250 mL/hr over 60 Minutes Intravenous  Once 11/01/19 1842 11/01/19 2051       Time spent: 20 minutes    Erin Hearing ANP  [facility]  7 am-330 pm/M-F for direct patient care and secure chat Please refer to [place] for contact information 03/20/2020, 8:09 AM  LOS: 140 days

## 2020-03-20 NOTE — Plan of Care (Signed)
  Problem: Clinical Measurements: Goal: Will remain free from infection Outcome: Progressing Goal: Diagnostic test results will improve Outcome: Progressing Goal: Respiratory complications will improve Outcome: Progressing Goal: Cardiovascular complication will be avoided Outcome: Progressing   Problem: Activity: Goal: Risk for activity intolerance will decrease Outcome: Progressing   Problem: Nutrition: Goal: Adequate nutrition will be maintained Outcome: Progressing   Problem: Coping: Goal: Level of anxiety will decrease Outcome: Progressing   Problem: Elimination: Goal: Will not experience complications related to bowel motility Outcome: Progressing Goal: Will not experience complications related to urinary retention Outcome: Progressing   Problem: Pain Managment: Goal: General experience of comfort will improve Outcome: Progressing   Problem: Safety: Goal: Ability to remain free from injury will improve Outcome: Progressing   Problem: Skin Integrity: Goal: Risk for impaired skin integrity will decrease Outcome: Progressing   Problem: Education: Goal: 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 Outcome: Progressing   Problem: Activity: Goal: Ability to tolerate increased activity will improve Outcome: Progressing   Problem: Health Behavior/Discharge Planning: Goal: 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 Outcome: Progressing   Problem: Respiratory: Goal: Patent airway maintenance will improve Outcome: Progressing   Problem: Role Relationship: Goal: Ability to communicate will improve Outcome: Progressing   Problem: Education: Goal: Knowledge of the prescribed therapeutic regimen Outcome: Progressing Goal: Knowledge of disease or condition will improve Outcome: Progressing   Problem: Clinical Measurements: Goal: Neurologic status will improve Outcome: Progressing   Problem: Tissue Perfusion: Goal: Ability to maintain  intracranial pressure will improve Outcome: Progressing   Problem: Respiratory: Goal: Will regain and/or maintain adequate ventilation Outcome: Progressing   Problem: Skin Integrity: Goal: Risk for impaired skin integrity will decrease Outcome: Progressing Goal: Demonstration of wound healing without infection will improve Outcome: Progressing   Problem: Psychosocial: Goal: Ability to verbalize positive feelings about self will improve Outcome: Progressing Goal: Ability to participate in self-care as condition permits will improve Outcome: Progressing Goal: Ability to identify appropriate support needs will improve Outcome: Progressing   Problem: Health Behavior/Discharge Planning: Goal: Ability to manage health-related needs will improve Outcome: Progressing   Problem: Nutritional: Goal: Risk of aspiration will decrease Outcome: Progressing Goal: Dietary intake will improve Outcome: Progressing   Problem: Communication: Goal: Ability to communicate needs accurately will improve Outcome: Progressing   

## 2020-03-21 LAB — GLUCOSE, CAPILLARY
Glucose-Capillary: 98 mg/dL (ref 70–99)
Glucose-Capillary: 130 mg/dL — ABNORMAL HIGH (ref 70–99)
Glucose-Capillary: 88 mg/dL (ref 70–99)
Glucose-Capillary: 165 mg/dL — ABNORMAL HIGH (ref 70–99)

## 2020-03-21 NOTE — Plan of Care (Signed)
  Problem: Clinical Measurements: Goal: Will remain free from infection Outcome: Progressing Goal: Diagnostic test results will improve Outcome: Progressing Goal: Respiratory complications will improve Outcome: Progressing Goal: Cardiovascular complication will be avoided Outcome: Progressing   Problem: Activity: Goal: Risk for activity intolerance will decrease Outcome: Progressing   Problem: Nutrition: Goal: Adequate nutrition will be maintained Outcome: Progressing   Problem: Coping: Goal: Level of anxiety will decrease Outcome: Progressing   Problem: Elimination: Goal: Will not experience complications related to bowel motility Outcome: Progressing Goal: Will not experience complications related to urinary retention Outcome: Progressing   Problem: Pain Managment: Goal: General experience of comfort will improve Outcome: Progressing   Problem: Safety: Goal: Ability to remain free from injury will improve Outcome: Progressing   Problem: Skin Integrity: Goal: Risk for impaired skin integrity will decrease Outcome: Progressing   Problem: Education: Goal: 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 Outcome: Progressing   Problem: Activity: Goal: Ability to tolerate increased activity will improve Outcome: Progressing   Problem: Health Behavior/Discharge Planning: Goal: 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 Outcome: Progressing   Problem: Respiratory: Goal: Patent airway maintenance will improve Outcome: Progressing   Problem: Role Relationship: Goal: Ability to communicate will improve Outcome: Progressing   Problem: Education: Goal: Knowledge of the prescribed therapeutic regimen Outcome: Progressing Goal: Knowledge of disease or condition will improve Outcome: Progressing   Problem: Clinical Measurements: Goal: Neurologic status will improve Outcome: Progressing   Problem: Tissue Perfusion: Goal: Ability to maintain  intracranial pressure will improve Outcome: Progressing   Problem: Respiratory: Goal: Will regain and/or maintain adequate ventilation Outcome: Progressing   Problem: Skin Integrity: Goal: Risk for impaired skin integrity will decrease Outcome: Progressing Goal: Demonstration of wound healing without infection will improve Outcome: Progressing   Problem: Psychosocial: Goal: Ability to verbalize positive feelings about self will improve Outcome: Progressing Goal: Ability to participate in self-care as condition permits will improve Outcome: Progressing Goal: Ability to identify appropriate support needs will improve Outcome: Progressing   Problem: Health Behavior/Discharge Planning: Goal: Ability to manage health-related needs will improve Outcome: Progressing   Problem: Nutritional: Goal: Risk of aspiration will decrease Outcome: Progressing Goal: Dietary intake will improve Outcome: Progressing   Problem: Communication: Goal: Ability to communicate needs accurately will improve Outcome: Progressing   

## 2020-03-21 NOTE — Progress Notes (Signed)
[facility] PROGRESS NOTE  Ryan Orozco QZR:[id number] DOB: 10-19-1965 DOA: 11/01/2019 PCP: Default, Provider, MD       12/19/19                          03/15/20 OOB to chair   Status: Remains inpatient appropriate because:Altered mental status, Unsafe d/c plan and Inpatient level of care appropriate due to severity of illness. Patient newly diagnosed with anoxic brain injury  Dispo: The patient is from: Home              Anticipated d/c is to: SNF              Anticipated d/c date is: > 3 days              Patient currently is medically stable to d/c.  Barriers to discharge: No SNF bed offers.  Needs trach capable facility.  Still awaiting approval of Medicaid and disability.  No bed offers.   Code Status: DNR after d/w palliative team on 1/27 Family Communication: 2/11 updated dtr Ryan Orozco via text to include picture of her father up in chair-she will be in [place] on 2/21 DVT prophylaxis: Subcutaneous heparin Vaccination status: Will order first dose Pfizer COVID-vaccine on 1/10; next week we will order pneumococcal and influenza vaccinations-2nd dose ordered 277  Foley catheter:  No  HPI: 55 year old male with HIV, chronic alcohol abuse who was presented to the emergency department after being found unresponsive outside a convinient store.  EMS found him pulseless in PEA, unknown downtime, successfully resuscitated and brought to the emergency department.  UDS positive for THC and benzodiazepines.  CT head unremarkable.  Failed extubation trials due to severe anoxic brain injury and had tracheostomy placed.  PEG placed on 10/15.  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  This has led to significant pain as well.  [name] was consulted and recommendations/input/orders highly appreciated.  She may also benefit from Botox injections to both hips to aid in treatment of spasticity.    Significant Hospital Events   9/29 Admit post PEA arrest. UDS positive for THC, benzo's. ETOH 177 10/05 EEG ongoing. Versed restarted overnight due to agitation. Nicardipine increased.  10/06 Developed tachypnea with WUA, no follow commands off sedation  10/07 Vomiting, TF held / restarted with recurrent vomiting 10/16 tracheostomy collar for 9 hours 10/18->10/19 off vent all night  10/20> TC 11/15 > downsized to #4 Shiley flex CFS  Subjective: Awake and somewhat restless.  Sitting up in bed  Objective: Vitals:   03/20/20 2047 03/21/20 0205  BP: 128/84   Pulse: 96 74  Resp: 20 18  Temp: 98 F (36.7 C)   SpO2:  98%    Intake/Output Summary (Last 24 hours) at 03/21/2020 0749 Last data filed at 03/20/2020 1636 Gross per 24 hour  Intake -  Output 600 ml  Net -600 ml   Filed Weights   03/06/20 0500 03/14/20 0740 03/15/20 0742  Weight: 72 kg 74 kg 74 kg    Exam: General: Alert, slightly restless but not agitated, no acute distress otherwise Pulmonary: Lung sounds are clear to auscultation anteriorly FiO2 21% via humidified trach collar without secretions noted at trach Cardiac: Normal heart sounds, pulse regular, no peripheral edema. Abdomen: PEG tube for feedings and medications.  Abdomen otherwise soft with normoactive bowel sounds and PEG insertion site unremarkable.   LBM 2/15 Neurological: Severe hypertonicity/spasticity bilateral lower  extremities involving the hip flexors and hamstrings;  keeps bilateral hips flexed -continues to have pain w/ attempts to extend either leg especially the right leg.  Nonpurposeful use of upper extremities.   Psychiatric: [place], nonverbal so unable to accurately assess orientation Skin: Wound on dorsum of left foot remains clean with pink granular base and no periwound edema or drainage  Assessment/Plan: Acute problems: Anoxic brain injury 2/2 PEA arrest/pain syndrome secondary to hypertonicity/spastic tetraplegia:  -Presumed 2/2  combination alcohol and BZD overdose with UDS positive for THC and BZDs -Significant brain injury/spastic tetraplegia -needs long-term SNF -Continue baclofen, gabapentin, scheduled oxycodone and Zanaflex for spastic tetraplegia -1/6 [name] with rehabilitative medicine performed Botox injection into the quadriceps and rectus femoris as well as the biceps femoris and hamstrings, semimembranosus and semitendinosus.  Unable to inject iliopsoas without EMG guidance w/o significant improvement and spasticity hypertonicity. -Continue bilateral UE WHO resting splints/PROM q 4 hours/KREG bed -Continue Seroquel  -Continue out of bed to chair with lift  Blister dorsum left foot  03/15/20  -Possibly pressure related -Appreciate wound care nurse assistance.  Superficial skin from blister debrided and Xeroform gauze applied to open wound and was covered with a foam dressing with recommendations to change the Xerofoam daily and the foam dressing every 3 days or as needed for soiling  Goals of care:  -Palliative care team reconsulted on 1/27 and after an extensive discussion daughter does realize that patient quite debilitated and would not survive a resuscitation therefore patient made a DNR  Chronic respiratory failure with inability to maintain patent airway requiring chronic tracheostomy tube/Recent Corynebacterium tracheobronchitis (resolved) -Continue #4 cuffless trach in place-trach changed out on 1/10 RT -Given altered mentation and inability to manage secretions PCCM states patient is not a candidate for elective decannulation  Fever/peri-PEG drainage positive for corynebacterium and rare Pseudomonas -PEG site with some drainage but no erythema, induration or pain -Suspect may have colonization noting recently had corynebacterium tracheobronchitis treated -continue bacitracin ointment  HIV:  -CD4 count of 124 October 2021 (had been as high as 250 Dec 2020)-as of today up to 259; viral load  obtained on 1/15 was <20 -Dc'd Tivicay and Descovy infavor of Biktarvy to for eventual dc to SNF; per my discussion with infectious disease physician [name] on 12/23 preferred regimen would be Descovy and Tivicay-she will contact the HIV pharmacist to attempt to obtain discount cards to make this a more affordable option.  Once this has been confirmed will transition back to Descovy and Tivicay -CD4 count on 12/23 up to 259 -ID recommends continuing prophylactic Bactrim an additional 3 months (last dose 04/24/2020)  Dysphagia 2/2 anoxic brain injury -Continue tube feeding per PEG -Given altered mentation patient not a candidate for further speech evaluations and will be permanently dependent on enteral tube feedings  Constipation -We will continue scheduled Colace, fiber supplements per tube as well as daily MiraLAX -Suspect requirement for narcotics and multiple muscle relaxers contributing to decreased GI motility and constipation symptoms  Protein calorie malnutrition nutrition Status: Nutrition Problem: Increased nutrient needs Etiology: acute illness Signs/Symptoms: estimated needs Interventions: Tube feeding bolus doses of Osmolite, Juven twice daily, Prosource TF 45 mL 3 times daily Estimated body mass index is 26.33 kg/m as calculated from the following:   Height as of this encounter: 5' 6"  (1.676 m).   Weight as of this encounter: 74 kg.   Multiple decubiti not POA Wound / Incision (Open or Dehisced) 11/17/19 Puncture Abdomen Left;Anterior;Upper G-tube insertion site  (Active)  Date First Assessed/Time First Assessed: 11/17/19 1646   Wound Type: Puncture  Location: Abdomen  Location Orientation: Left;Anterior;Upper  Wound Description (Comments): G-tube insertion site   Present on Admission: No    Assessments 11/17/2019  4:47 PM 03/20/2020 10:00 AM  Dressing Type Gauze (Comment);Tape dressing -  Dressing Changed New -  Dressing Status Clean;Dry;Intact -  Dressing Change  Frequency PRN -  Site / Wound Assessment Clean;Dry;Pink -  Peri-wound Assessment Intact -  Wound Length (cm) - 0 cm  Wound Width (cm) - 0 cm  Wound Depth (cm) - 0 cm  Wound Volume (cm^3) - 0 cm^3  Wound Surface Area (cm^2) - 0 cm^2  Closure None -  Drainage Amount None -  Treatment Cleansed -     No Linked orders to display     Wound / Incision (Open or Dehisced) 03/16/20 Other (Comment) Foot Anterior;Left (Active)  Date First Assessed/Time First Assessed: 03/16/20 0800   Wound Type: (c) Other (Comment)  Location: Foot  Location Orientation: Anterior;Left  Present on Admission: No    Assessments 03/16/2020  8:00 AM 03/20/2020 10:00 AM  Dressing Type Other (Comment) -  Dressing Changed Changed -  Dressing Status Old drainage -  Dressing Change Frequency Daily -  Site / Wound Assessment Pink;Red -  Peri-wound Assessment Intact -  Wound Length (cm) - 0 cm  Wound Width (cm) - 0 cm  Wound Depth (cm) - 0 cm  Wound Volume (cm^3) - 0 cm^3  Wound Surface Area (cm^2) - 0 cm^2  Drainage Amount Minimal -  Drainage Description Serosanguineous -  Treatment Cleansed -     No Linked orders to display      Other problems: Hypertension/grade 1 diastolic dysfunction:  -Continue amlodipine  -Echocardiogram this admission with preserved LVEF with evidence of grade   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   -Continue Keppra; levetiracetam level 35.9 on 11/12 -no further seizure activity since transitioned out of ICU setting therefore will discontinue seizure precaution  SIRS/Fever 2/2 recurrent enterococcus UTI -Patient developed mild elevation in temperature overnight to 1/13 associated with slight increase in respiratory secretions and congested sounding cough/abnormal pulmonary exam as well as tachycardia./14 respiratory status much improved and no further fevers. -Respiratory viral panel neg for COVID, influenza and RSV.   -UA slightly abnormal/cx c/w enterococcus  UTI- started on nitrofurantoin (prior UTI treated w/ augmentin) has completed 3 days of treatment -X-ray with bilateral opacities concerning for early pneumonia but CT of the chest without pneumonia, broad-spectrum antibiotics dc'd 1/14 -Because of dehydration increased frequency of free water from 200 cc every 4 hours to every 2 hours on 1/13- stopped on 1/15  Low-grade fever/abnormal urinalysis -2/8: Solitary T-max 100.5 overnight.  Likely related to atelectasis.  Obtain chest x-ray today that was unremarkable -As a precaution we will go ahead and obtain urinalysis and culture and blood cultures; electrolytes unremarkable, LDH normal at 178, no leukocytosis -Urinalysis abnormal with hazy appearance, trace leukocytes, rare bacteria and 11-20 WBCs-urine culture was never obtained and subsequently fever has resolved -Blood cultures are negative -Chest x-ray obtained on 2/14 unremarkable  Inguinal hernias -Evaluated by general surgery this admission. Documented as chronically incarcerated. Patient has been clinically stable and tolerating tube feedings and having bowel movements so unless patient obstructed would not be considered a candidate for repair. Even if obstructed consulting surgeon was not sure he would be a candidate for hernia repair regardless  Constipation -No documented bowel movement since 1/13 -We will continue  scheduled Colace, fiber supplements per tube -Add daily MiraLAX -Given one-time dose milk of magnesia on 1/20-no documented bowel movement on flowsheet -Suspect requirement for narcotics and multiple muscle relaxers contributing to decreased GI motility and constipation symptoms  Goals of care:  -Palliative care was involved during this hospitalization.   -Goals of care were discussed. remains full code.  -Palliative care recommended outpatient follow-up with palliative providers. -Reconsult Palliative medicine 1/27   Data Reviewed: Basic Metabolic Panel: Recent  Labs  Lab 03/15/20 1021 03/18/20 0135  NA 143 148*  K 3.9 4.0  CL 106 110  CO2 25 26  GLUCOSE 112* 99  BUN 20 23*  CREATININE 0.65 0.67  CALCIUM 10.6* 10.8*  MG 1.9 2.2  PHOS 3.8 3.5   Liver Function Tests: Recent Labs  Lab 03/15/20 1021 03/18/20 0135  AST 26 26  ALT 20 21  ALKPHOS 96 87  BILITOT 0.6 0.6  PROT 8.4* 8.4*  ALBUMIN 3.7 3.6   No results for input(s): LIPASE, AMYLASE in the last 168 hours. No results for input(s): AMMONIA in the last 168 hours. CBC: Recent Labs  Lab 03/15/20 1021 03/18/20 0135  WBC 3.8* 5.3  NEUTROABS 2.6 3.1  HGB 14.1 14.8  HCT 45.5 45.6  MCV 96.8 96.4  PLT 145* 198   Cardiac Enzymes: No results for input(s): CKTOTAL, CKMB, CKMBINDEX, TROPONINI in the last 168 hours. BNP (last 3 results) Recent Labs    02/15/20 0433  BNP 3.9    ProBNP (last 3 results) No results for input(s): PROBNP in the last 8760 hours.  CBG: Recent Labs  Lab 03/20/20 0722 03/20/20 1200 03/20/20 1523 03/20/20 1933 03/20/20 2303  GLUCAP 102* 157* 69* 221* 122*    Recent Results (from the past 240 hour(s))  Culture, blood (Routine X 2) w Reflex to ID Panel     Status: None   Collection Time: 03/12/20  9:19 AM   Specimen: BLOOD LEFT FOREARM  Result Value Ref Range Status   Specimen Description BLOOD LEFT FOREARM  Final   Special Requests   Final    BOTTLES DRAWN AEROBIC AND ANAEROBIC Blood Culture results may not be optimal due to an inadequate volume of blood received in culture bottles   Culture   Final    NO GROWTH 5 DAYS Performed at [facility], [place] [address] [place], [place] [address]    Report Status 03/17/2020 FINAL  Final  Culture, blood (Routine X 2) w Reflex to ID Panel     Status: None   Collection Time: 03/12/20  9:22 AM   Specimen: BLOOD  Result Value Ref Range Status   Specimen Description BLOOD LEFT ANTECUBITAL  Final   Special Requests   Final    BOTTLES DRAWN AEROBIC AND ANAEROBIC Blood Culture results may not  be optimal due to an inadequate volume of blood received in culture bottles   Culture   Final    NO GROWTH 5 DAYS Performed at [facility], [place] [address] [place], [place] [address]    Report Status 03/17/2020 FINAL  Final     Studies: No results found.  Scheduled Meds: . acetaminophen (TYLENOL) oral liquid 160 mg/5 mL  650 mg Per Tube Q6H  . amLODipine  10 mg Per Tube Daily  . bacitracin   Topical BID  . baclofen  30 mg Per Tube TID  . chlorhexidine gluconate (MEDLINE KIT)  15 mL Mouth Rinse BID  . Darunavir-Cobicisctat-Emtricitabine-Tenofovir Alafenamide  1 tablet Oral  Q breakfast  . famotidine  10.4 mg Per Tube BID  . feeding supplement (OSMOLITE 1.5 CAL)  237 mL Per Tube Q24H  . feeding supplement (OSMOLITE 1.5 CAL)  474 mL Per Tube TID  . feeding supplement (PROSource TF)  45 mL Per Tube TID  . fiber  1 packet Per Tube BID  . folic acid  1 mg Per Tube Daily  . free water  200 mL Per Tube Q4H  . gabapentin  200 mg Per Tube Q8H  . heparin injection (subcutaneous)  5,000 Units Subcutaneous Q8H  . levETIRAcetam  1,500 mg Per Tube BID  . magic mouthwash w/lidocaine  5 mL Oral QID  . Muscle Rub   Topical QID  . oxyCODONE  5 mg Per Tube Q6H  . polyethylene glycol  17 g Per Tube QHS  . QUEtiapine  12.5 mg Per Tube QHS  . scopolamine  1 patch Transdermal Q72H  . sulfamethoxazole-trimethoprim  1 tablet Per Tube Once per day on Mon Wed Fri  . thiamine  100 mg Per Tube Daily  . tiZANidine  6 mg Per Tube Q6H   Continuous Infusions:   Active Problems:   Cardiac arrest [facility])   Community acquired pneumonia of left upper lobe of lung   Anoxic brain injury ([place])   Alcohol abuse   Acute respiratory failure with hypoxia (HCC)   Vomiting   Pressure injury of skin   Small bowel obstruction (HCC)   Palliative care encounter   Bowel obstruction (HCC)   Chronic respiratory failure (HCC)   Diastolic dysfunction   Tracheostomy dependent (HCC)   Dysphagia   Protein-calorie  malnutrition (HCC)   Seizures (HCC)   Bilateral recurrent inguinal hernia   Muscle spasticity   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HCC)   Tracheobronchitis   Sequela of Corynebacterium infection   Abnormal urine   Urinary tract infection due to Enterococcus   Fever   Consultants:  PCCM  Palliative medicine  General surgery  Neurology  Interventional radiology  Procedures:  EEG  Echocardiogram  Tracheostomy  Antibiotics: Anti-infectives (From admission, onward)   Start     Dose/Rate Route Frequency Ordered Stop   02/17/20 1100  nitrofurantoin (FURADANTIN) 25 MG/5ML suspension 100 mg        100 mg Per Tube Every 12 hours 02/17/20 1006 02/20/20 0924   02/15/20 1000  vancomycin (VANCOCIN) IVPB 1000 mg/200 mL premix  Status:  Discontinued        1,000 mg 200 mL/hr over 60 Minutes Intravenous Every 12 hours 02/14/20 2226 02/16/20 0742   02/14/20 2245  piperacillin-tazobactam (ZOSYN) IVPB 3.375 g  Status:  Discontinued        3.375 g 12.5 mL/hr over 240 Minutes Intravenous Every 8 hours 02/14/20 2226 02/16/20 0742   02/14/20 2245  vancomycin (VANCOREADY) IVPB 1500 mg/300 mL        1,500 mg 150 mL/hr over 120 Minutes Intravenous  Once 02/14/20 2226 02/15/20 0739   02/01/20 2200  amoxicillin-clavulanate (AUGMENTIN) 250-62.5 MG/5ML suspension 500 mg        500 mg Per Tube Every 8 hours 02/01/20 1343 02/06/20 1333   01/31/20 1015  levofloxacin (LEVAQUIN) 25 MG/ML solution 500 mg  Status:  Discontinued        500 mg Per Tube Daily 01/31/20 0919 02/01/20 1343   01/31/20 1000  cefTRIAXone (ROCEPHIN) 1 g in sodium chloride 0.9 % 100 mL IVPB  Status:  Discontinued        1 g  200 mL/hr over 30 Minutes Intravenous Every 24 hours 01/31/20 0905 01/31/20 0919   01/19/20 2200  linezolid (ZYVOX) tablet 600 mg        600 mg Per Tube Every 12 hours 01/19/20 1534 01/25/20 2019   01/19/20 1100  levofloxacin (LEVAQUIN) tablet 500 mg  Status:  Discontinued        500 mg Per Tube  Daily 01/19/20 1004 01/19/20 1534   12/19/19 0930  Darunavir-Cobicisctat-Emtricitabine-Tenofovir Alafenamide (SYMTUZA) 800-150-200-10 MG TABS 1 tablet        1 tablet Oral Daily with breakfast 12/19/19 0840     12/13/19 1000  bictegravir-emtricitabine-tenofovir AF (BIKTARVY) 50-200-25 MG per tablet 1 tablet  Status:  Discontinued        1 tablet Oral Daily 12/12/19 1413 12/19/19 0840   11/17/19 1548  ceFAZolin (ANCEF) 2-4 GM/100ML-% IVPB       Note to Pharmacy: Domenick Bookbinder   : cabinet override      11/17/19 1548 11/18/19 0359   11/16/19 1515  ceFAZolin (ANCEF) IVPB 2g/100 mL premix        2 g 200 mL/hr over 30 Minutes Intravenous To Radiology 11/16/19 1511 11/17/19 1625   11/15/19 0900  sulfamethoxazole-trimethoprim (BACTRIM DS) 800-160 MG per tablet 1 tablet        1 tablet Per Tube Once per day on Mon Wed Fri 11/13/19 1906     11/13/19 1615  dolutegravir (TIVICAY) tablet 50 mg  Status:  Discontinued        50 mg Oral Daily 11/13/19 1521 12/12/19 1413   11/13/19 1615  emtricitabine-tenofovir AF (DESCOVY) 200-25 MG per tablet 1 tablet  Status:  Discontinued        1 tablet Per Tube Daily 11/13/19 1521 12/12/19 1413   11/13/19 1530  sulfamethoxazole-trimethoprim (BACTRIM DS) 800-160 MG per tablet 1 tablet  Status:  Discontinued        1 tablet Oral Once per day on Mon Wed Fri 11/13/19 1441 11/13/19 1906   11/13/19 1500  bictegravir-emtricitabine-tenofovir AF (BIKTARVY) 50-200-25 MG per tablet 1 tablet  Status:  Discontinued        1 tablet Oral Daily 11/13/19 1403 11/13/19 1521   11/10/19 1000  erythromycin 250 mg in sodium chloride 0.9 % 100 mL IVPB        250 mg 100 mL/hr over 60 Minutes Intravenous Every 8 hours 11/10/19 0907 11/11/19 2000   11/07/19 1200  ceFEPIme (MAXIPIME) 2 g in sodium chloride 0.9 % 100 mL IVPB        2 g 200 mL/hr over 30 Minutes Intravenous Every 8 hours 11/07/19 1013 11/13/19 2127   11/03/19 1200  cefTRIAXone (ROCEPHIN) 2 g in sodium chloride 0.9 % 100 mL  IVPB        2 g 200 mL/hr over 30 Minutes Intravenous Every 24 hours 11/03/19 0922 11/05/19 1427   11/02/19 0800  vancomycin (VANCOREADY) IVPB 750 mg/150 mL  Status:  Discontinued        750 mg 150 mL/hr over 60 Minutes Intravenous Every 12 hours 11/01/19 1935 11/02/19 1105   11/02/19 0600  piperacillin-tazobactam (ZOSYN) IVPB 3.375 g  Status:  Discontinued        3.375 g 12.5 mL/hr over 240 Minutes Intravenous Every 8 hours 11/02/19 0311 11/03/19 0920   11/01/19 2200  ceFEPIme (MAXIPIME) 2 g in sodium chloride 0.9 % 100 mL IVPB  Status:  Discontinued        2 g 200 mL/hr over 30 Minutes Intravenous Every  12 hours 11/01/19 2143 11/02/19 0311   11/01/19 1915  vancomycin (VANCOREADY) IVPB 1500 mg/300 mL        1,500 mg 150 mL/hr over 120 Minutes Intravenous  Once 11/01/19 1909 11/01/19 2147   11/01/19 1845  cefTRIAXone (ROCEPHIN) 1 g in sodium chloride 0.9 % 100 mL IVPB        1 g 200 mL/hr over 30 Minutes Intravenous  Once 11/01/19 1842 11/01/19 2051   11/01/19 1845  azithromycin (ZITHROMAX) 500 mg in sodium chloride 0.9 % 250 mL IVPB        500 mg 250 mL/hr over 60 Minutes Intravenous  Once 11/01/19 1842 11/01/19 2051       Time spent: 20 minutes    Erin Hearing ANP  [facility]  7 am-330 pm/M-F for direct patient care and secure chat Please refer to [place] for contact information 03/21/2020, 7:49 AM  LOS: 141 days

## 2020-03-22 LAB — GLUCOSE, CAPILLARY
Glucose-Capillary: 155 mg/dL — ABNORMAL HIGH (ref 70–99)
Glucose-Capillary: 73 mg/dL (ref 70–99)
Glucose-Capillary: 91 mg/dL (ref 70–99)
Glucose-Capillary: 93 mg/dL (ref 70–99)
Glucose-Capillary: 96 mg/dL (ref 70–99)

## 2020-03-22 NOTE — Plan of Care (Signed)
  Problem: Clinical Measurements: Goal: Will remain free from infection Outcome: Progressing Goal: Diagnostic test results will improve Outcome: Progressing Goal: Respiratory complications will improve Outcome: Progressing Goal: Cardiovascular complication will be avoided Outcome: Progressing   Problem: Activity: Goal: Risk for activity intolerance will decrease Outcome: Progressing   Problem: Nutrition: Goal: Adequate nutrition will be maintained Outcome: Progressing   Problem: Coping: Goal: Level of anxiety will decrease Outcome: Progressing   Problem: Elimination: Goal: Will not experience complications related to bowel motility Outcome: Progressing Goal: Will not experience complications related to urinary retention Outcome: Progressing   Problem: Pain Managment: Goal: General experience of comfort will improve Outcome: Progressing   Problem: Safety: Goal: Ability to remain free from injury will improve Outcome: Progressing   Problem: Skin Integrity: Goal: Risk for impaired skin integrity will decrease Outcome: Progressing   Problem: Education: Goal: 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 Outcome: Progressing   Problem: Activity: Goal: Ability to tolerate increased activity will improve Outcome: Progressing   Problem: Health Behavior/Discharge Planning: Goal: 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 Outcome: Progressing   Problem: Respiratory: Goal: Patent airway maintenance will improve Outcome: Progressing   Problem: Role Relationship: Goal: Ability to communicate will improve Outcome: Progressing   Problem: Education: Goal: Knowledge of the prescribed therapeutic regimen Outcome: Progressing Goal: Knowledge of disease or condition will improve Outcome: Progressing   Problem: Clinical Measurements: Goal: Neurologic status will improve Outcome: Progressing   Problem: Tissue Perfusion: Goal: Ability to maintain  intracranial pressure will improve Outcome: Progressing   Problem: Respiratory: Goal: Will regain and/or maintain adequate ventilation Outcome: Progressing   Problem: Skin Integrity: Goal: Risk for impaired skin integrity will decrease Outcome: Progressing Goal: Demonstration of wound healing without infection will improve Outcome: Progressing   Problem: Psychosocial: Goal: Ability to verbalize positive feelings about self will improve Outcome: Progressing Goal: Ability to participate in self-care as condition permits will improve Outcome: Progressing Goal: Ability to identify appropriate support needs will improve Outcome: Progressing   Problem: Health Behavior/Discharge Planning: Goal: Ability to manage health-related needs will improve Outcome: Progressing   Problem: Nutritional: Goal: Risk of aspiration will decrease Outcome: Progressing Goal: Dietary intake will improve Outcome: Progressing   Problem: Communication: Goal: Ability to communicate needs accurately will improve Outcome: Progressing   

## 2020-03-22 NOTE — Plan of Care (Signed)
Patient remains Alert. Trache and peg intact. Patient remains free from falls. Safety precautions maintained,

## 2020-03-22 NOTE — Progress Notes (Addendum)
11am: Tammy with [facility] attempted to meet with patient yesterday but was unable to do so due to RN's providing patient care. Tammy states admissions are on hold at [facility] facility due to COVID - Tammy to revisit possible admission next week.  8am: CSW spoke with [place] at [facility] who states she cannot offer a bed due to the trach and LOG.  CSW spoke with Loie at [facility] who states the facility cannot accommodate the patient's trach.  Per [place] [facility] - patient still only has [employer] and his long term application is still pending.  Edwin Dada, MSW, LCSW Transitions of Care  Clinical Social Worker II [phone]

## 2020-03-22 NOTE — Progress Notes (Signed)
[facility] PROGRESS NOTE  Pilar California KZL:[id number] DOB: 12/17/1965 DOA: 11/01/2019 PCP: Default, Provider, MD       12/19/19                          03/15/20 OOB to chair   Status: Remains inpatient appropriate because:Altered mental status, Unsafe d/c plan and Inpatient level of care appropriate due to severity of illness. Patient newly diagnosed with anoxic brain injury  Dispo: The patient is from: Home              Anticipated d/c is to: SNF              Anticipated d/c date is: > 3 days              Patient currently is medically stable to d/c.  Barriers to discharge: No SNF bed offers.  Needs trach capable facility.  Still awaiting approval of Medicaid and disability.  No bed offers. Facility to review today 2/18   Code Status: DNR after d/w palliative team on 1/27 Family Communication: 2/11 updated dtr [name] via text to include picture of her father up in chair-she will be in [place] on 2/21 DVT prophylaxis: Subcutaneous heparin Vaccination status: Will order first dose Pfizer COVID-vaccine on 1/10; next week we will order pneumococcal and influenza vaccinations-2nd dose ordered 277  Foley catheter:  No  HPI: 55 year old male with HIV, chronic alcohol abuse who was presented to the emergency department after being found unresponsive outside a convinient store.  EMS found him pulseless in PEA, unknown downtime, successfully resuscitated and brought to the emergency department.  UDS positive for THC and benzodiazepines.  CT head unremarkable.  Failed extubation trials due to severe anoxic brain injury and had tracheostomy placed.  PEG placed on 10/15.  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  This has led to significant pain as well.  [name] was consulted and recommendations/input/orders highly appreciated.  She may also benefit from Botox injections to both hips to aid in  treatment of spasticity.   Significant Hospital Events   9/29 Admit post PEA arrest. UDS positive for THC, benzo's. ETOH 177 10/05 EEG ongoing. Versed restarted overnight due to agitation. Nicardipine increased.  10/06 Developed tachypnea with WUA, no follow commands off sedation  10/07 Vomiting, TF held / restarted with recurrent vomiting 10/16 tracheostomy collar for 9 hours 10/18->10/19 off vent all night  10/20> TC 11/15 > downsized to #4 Shiley flex CFS  Subjective: Alert.  Smiling and interactive.  Took a video to send to his daughter and asked him to say hey and smile and he did both of these activities.  Objective: Vitals:   03/21/20 2055 03/22/20 0501  BP: (!) 119/100 121/89  Pulse: 65 68  Resp: 18 18  Temp: 98.5 F (36.9 C) 98.2 F (36.8 C)  SpO2: 95% 96%    Intake/Output Summary (Last 24 hours) at 03/22/2020 0752 Last data filed at 03/21/2020 2240 Gross per 24 hour  Intake 1422 ml  Output 1250 ml  Net 172 ml   Filed Weights   03/06/20 0500 03/14/20 0740 03/15/20 0742  Weight: 72 kg 74 kg 74 kg    Exam: General: Alert and calm in no acute distress Pulmonary: Lungs clear to auscultation bilaterally.  FiO2 21% via humidified trach collar with no secretions noted Cardiac: Normal heart sounds, pulse regular and nontachycardic, no peripheral edema Abdomen: PEG  tube for feedings and medications.  Abdomen soft and nontender with normoactive bowel sounds.  LBM 2/16 Neurological: Severe hypertonicity/spasticity bilateral lower extremities involving the hip flexors and hamstrings;  keeps bilateral hips flexed -continues to have pain w/ attempts to extend either leg especially the right leg.  Nonpurposeful use of upper extremities.   Psychiatric: Skin: Wound on dorsum of left foot remains clean with pink granular base and no periwound edema or drainage  Assessment/Plan: Acute problems: Anoxic brain injury 2/2 PEA arrest/pain syndrome secondary to hypertonicity/spastic  tetraplegia:  -Presumed 2/2 combination alcohol and BZD overdose with UDS positive for THC and BZDs -Significant brain injury/spastic tetraplegia -needs long-term SNF -Continue baclofen, gabapentin, scheduled oxycodone and Zanaflex for spastic tetraplegia -1/6 [name] with rehabilitative medicine performed Botox injection into the quadriceps and rectus femoris as well as the biceps femoris and hamstrings, semimembranosus and semitendinosus.  Unable to inject iliopsoas without EMG guidance w/o significant improvement and spasticity hypertonicity. -Continue bilateral UE WHO resting splints/PROM q 4 hours/KREG bed -Continue Seroquel  -Continue out of bed to chair with lift  Blister dorsum left foot  03/15/20  -Possibly pressure related -Appreciate wound care nurse assistance.  Superficial skin from blister debrided and Xeroform gauze applied to open wound and was covered with a foam dressing with recommendations to change the Xerofoam daily and the foam dressing every 3 days or as needed for soiling  Goals of care:  -Palliative care team reconsulted on 1/27 and after an extensive discussion daughter does realize that patient quite debilitated and would not survive a resuscitation therefore patient made a DNR  Chronic respiratory failure with inability to maintain patent airway requiring chronic tracheostomy tube/Recent Corynebacterium tracheobronchitis (resolved) -Continue #4 cuffless trach in place-trach changed out on 1/10 RT -Given altered mentation and inability to manage secretions PCCM states patient is not a candidate for elective decannulation  Fever/peri-PEG drainage positive for corynebacterium and rare Pseudomonas -PEG site with some drainage but no erythema, induration or pain -Suspect may have colonization noting recently had corynebacterium tracheobronchitis treated -continue bacitracin ointment  HIV:  -CD4 count of 124 October 2021 (had been as high as 250 Dec 2020)-as of  today up to 259; viral load obtained on 1/15 was <20 -Dc'd Tivicay and Descovy infavor of Biktarvy to for eventual dc to SNF; per my discussion with infectious disease physician [name] on 12/23 preferred regimen would be Descovy and Tivicay-she will contact the HIV pharmacist to attempt to obtain discount cards to make this a more affordable option.  Once this has been confirmed will transition back to Descovy and Tivicay -CD4 count on 12/23 up to 259 -ID recommends continuing prophylactic Bactrim an additional 3 months (last dose 04/24/2020)  Dysphagia 2/2 anoxic brain injury -Continue tube feeding per PEG -Given altered mentation patient not a candidate for further speech evaluations and will be permanently dependent on enteral tube feedings  Constipation -We will continue scheduled Colace, fiber supplements per tube as well as daily MiraLAX -Suspect requirement for narcotics and multiple muscle relaxers contributing to decreased GI motility and constipation symptoms  Protein calorie malnutrition nutrition Status: Nutrition Problem: Increased nutrient needs Etiology: acute illness Signs/Symptoms: estimated needs Interventions: Tube feeding bolus doses of Osmolite, Juven twice daily, Prosource TF 45 mL 3 times daily Estimated body mass index is 26.33 kg/m as calculated from the following:   Height as of this encounter: 5' 6"  (1.676 m).   Weight as of this encounter: 74 kg.   Multiple decubiti not POA Wound /  Incision (Open or Dehisced) 11/17/19 Puncture Abdomen Left;Anterior;Upper G-tube insertion site  (Active)  Date First Assessed/Time First Assessed: 11/17/19 1646   Wound Type: Puncture  Location: Abdomen  Location Orientation: Left;Anterior;Upper  Wound Description (Comments): G-tube insertion site   Present on Admission: No    Assessments 11/17/2019  4:47 PM 03/20/2020 10:00 AM  Dressing Type Gauze (Comment);Tape dressing --  Dressing Changed New --  Dressing Status  Clean;Dry;Intact --  Dressing Change Frequency PRN --  Site / Wound Assessment Clean;Dry;Pink --  Peri-wound Assessment Intact --  Wound Length (cm) -- 0 cm  Wound Width (cm) -- 0 cm  Wound Depth (cm) -- 0 cm  Wound Volume (cm^3) -- 0 cm^3  Wound Surface Area (cm^2) -- 0 cm^2  Closure None --  Drainage Amount None --  Treatment Cleansed --     No Linked orders to display     Wound / Incision (Open or Dehisced) 03/16/20 Other (Comment) Foot Anterior;Left (Active)  Date First Assessed/Time First Assessed: 03/16/20 0800   Wound Type: (c) Other (Comment)  Location: Foot  Location Orientation: Anterior;Left  Present on Admission: No    Assessments 03/16/2020  8:00 AM 03/21/2020 10:40 PM  Dressing Type Other (Comment) Foam - Lift dressing to assess site every shift  Dressing Changed Changed New  Dressing Status Old drainage Clean;Intact;Dry  Dressing Change Frequency Daily --  Site / Wound Assessment Pink;Red --  Peri-wound Assessment Intact --  Drainage Amount Minimal None  Drainage Description Serosanguineous --  Treatment Cleansed --     No Linked orders to display      Other problems: Hypertension/grade 1 diastolic dysfunction:  -Continue amlodipine  -Echocardiogram this admission with preserved LVEF with evidence of grade   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   -Continue Keppra; levetiracetam level 35.9 on 11/12 -no further seizure activity since transitioned out of ICU setting therefore will discontinue seizure precaution  SIRS/Fever 2/2 recurrent enterococcus UTI -Patient developed mild elevation in temperature overnight to 1/13 associated with slight increase in respiratory secretions and congested sounding cough/abnormal pulmonary exam as well as tachycardia./14 respiratory status much improved and no further fevers. -Respiratory viral panel neg for COVID, influenza and RSV.   -UA slightly abnormal/cx c/w enterococcus UTI- started on nitrofurantoin  (prior UTI treated w/ augmentin) has completed 3 days of treatment -X-ray with bilateral opacities concerning for early pneumonia but CT of the chest without pneumonia, broad-spectrum antibiotics dc'd 1/14 -Because of dehydration increased frequency of free water from 200 cc every 4 hours to every 2 hours on 1/13- stopped on 1/15  Low-grade fever/abnormal urinalysis -2/8: Solitary T-max 100.5 overnight.  Likely related to atelectasis.  Obtain chest x-ray today that was unremarkable -As a precaution we will go ahead and obtain urinalysis and culture and blood cultures; electrolytes unremarkable, LDH normal at 178, no leukocytosis -Urinalysis abnormal with hazy appearance, trace leukocytes, rare bacteria and 11-20 WBCs-urine culture was never obtained and subsequently fever has resolved -Blood cultures are negative -Chest x-ray obtained on 2/14 unremarkable  Inguinal hernias -Evaluated by general surgery this admission. Documented as chronically incarcerated. Patient has been clinically stable and tolerating tube feedings and having bowel movements so unless patient obstructed would not be considered a candidate for repair. Even if obstructed consulting surgeon was not sure he would be a candidate for hernia repair regardless  Constipation -No documented bowel movement since 1/13 -We will continue scheduled Colace, fiber supplements per tube -Add daily MiraLAX -Given one-time dose milk of  magnesia on 1/20-no documented bowel movement on flowsheet -Suspect requirement for narcotics and multiple muscle relaxers contributing to decreased GI motility and constipation symptoms  Goals of care:  -Palliative care was involved during this hospitalization.   -Goals of care were discussed. remains full code.  -Palliative care recommended outpatient follow-up with palliative providers. -Reconsult Palliative medicine 1/27   Data Reviewed: Basic Metabolic Panel: Recent Labs  Lab 03/15/20 1021  03/18/20 0135  NA 143 148*  K 3.9 4.0  CL 106 110  CO2 25 26  GLUCOSE 112* 99  BUN 20 23*  CREATININE 0.65 0.67  CALCIUM 10.6* 10.8*  MG 1.9 2.2  PHOS 3.8 3.5   Liver Function Tests: Recent Labs  Lab 03/15/20 1021 03/18/20 0135  AST 26 26  ALT 20 21  ALKPHOS 96 87  BILITOT 0.6 0.6  PROT 8.4* 8.4*  ALBUMIN 3.7 3.6   No results for input(s): LIPASE, AMYLASE in the last 168 hours. No results for input(s): AMMONIA in the last 168 hours. CBC: Recent Labs  Lab 03/15/20 1021 03/18/20 0135  WBC 3.8* 5.3  NEUTROABS 2.6 3.1  HGB 14.1 14.8  HCT 45.5 45.6  MCV 96.8 96.4  PLT 145* 198   Cardiac Enzymes: No results for input(s): CKTOTAL, CKMB, CKMBINDEX, TROPONINI in the last 168 hours. BNP (last 3 results) Recent Labs    02/15/20 0433  BNP 3.9    ProBNP (last 3 results) No results for input(s): PROBNP in the last 8760 hours.  CBG: Recent Labs  Lab 03/20/20 2303 03/21/20 0731 03/21/20 1210 03/21/20 1957 03/21/20 2303  GLUCAP 122* 98 130* 88 165*    Recent Results (from the past 240 hour(s))  Culture, blood (Routine X 2) w Reflex to ID Panel     Status: None   Collection Time: 03/12/20  9:19 AM   Specimen: BLOOD LEFT FOREARM  Result Value Ref Range Status   Specimen Description BLOOD LEFT FOREARM  Final   Special Requests   Final    BOTTLES DRAWN AEROBIC AND ANAEROBIC Blood Culture results may not be optimal due to an inadequate volume of blood received in culture bottles   Culture   Final    NO GROWTH 5 DAYS Performed at [facility], [place] [address] [place], [place] [address]    Report Status 03/17/2020 FINAL  Final  Culture, blood (Routine X 2) w Reflex to ID Panel     Status: None   Collection Time: 03/12/20  9:22 AM   Specimen: BLOOD  Result Value Ref Range Status   Specimen Description BLOOD LEFT ANTECUBITAL  Final   Special Requests   Final    BOTTLES DRAWN AEROBIC AND ANAEROBIC Blood Culture results may not be optimal due to an  inadequate volume of blood received in culture bottles   Culture   Final    NO GROWTH 5 DAYS Performed at [facility], [place] [address], [place], [place] [address]    Report Status 03/17/2020 FINAL  Final     Studies: No results found.  Scheduled Meds: . acetaminophen (TYLENOL) oral liquid 160 mg/5 mL  650 mg Per Tube Q6H  . amLODipine  10 mg Per Tube Daily  . bacitracin   Topical BID  . baclofen  30 mg Per Tube TID  . chlorhexidine gluconate (MEDLINE KIT)  15 mL Mouth Rinse BID  . Darunavir-Cobicisctat-Emtricitabine-Tenofovir Alafenamide  1 tablet Oral Q breakfast  . famotidine  10.4 mg Per Tube BID  . feeding  supplement (OSMOLITE 1.5 CAL)  237 mL Per Tube Q24H  . feeding supplement (OSMOLITE 1.5 CAL)  474 mL Per Tube TID  . feeding supplement (PROSource TF)  45 mL Per Tube TID  . fiber  1 packet Per Tube BID  . folic acid  1 mg Per Tube Daily  . free water  200 mL Per Tube Q4H  . gabapentin  200 mg Per Tube Q8H  . heparin injection (subcutaneous)  5,000 Units Subcutaneous Q8H  . levETIRAcetam  1,500 mg Per Tube BID  . magic mouthwash w/lidocaine  5 mL Oral QID  . Muscle Rub   Topical QID  . oxyCODONE  5 mg Per Tube Q6H  . polyethylene glycol  17 g Per Tube QHS  . QUEtiapine  12.5 mg Per Tube QHS  . scopolamine  1 patch Transdermal Q72H  . sulfamethoxazole-trimethoprim  1 tablet Per Tube Once per day on Mon Wed Fri  . thiamine  100 mg Per Tube Daily  . tiZANidine  6 mg Per Tube Q6H   Continuous Infusions:   Active Problems:   Cardiac arrest [facility])   Community acquired pneumonia of left upper lobe of lung   Anoxic brain injury ([place])   Alcohol abuse   Acute respiratory failure with hypoxia (HCC)   Vomiting   Pressure injury of skin   Small bowel obstruction (HCC)   Palliative care encounter   Bowel obstruction (HCC)   Chronic respiratory failure (HCC)   Diastolic dysfunction   Tracheostomy dependent (HCC)   Dysphagia   Protein-calorie malnutrition (HCC)    Seizures (HCC)   Bilateral recurrent inguinal hernia   Muscle spasticity   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HCC)   Tracheobronchitis   Sequela of Corynebacterium infection   Abnormal urine   Urinary tract infection due to Enterococcus   Fever   Consultants:  PCCM  Palliative medicine  General surgery  Neurology  Interventional radiology  Procedures:  EEG  Echocardiogram  Tracheostomy  Antibiotics: Anti-infectives (From admission, onward)   Start     Dose/Rate Route Frequency Ordered Stop   02/17/20 1100  nitrofurantoin (FURADANTIN) 25 MG/5ML suspension 100 mg        100 mg Per Tube Every 12 hours 02/17/20 1006 02/20/20 0924   02/15/20 1000  vancomycin (VANCOCIN) IVPB 1000 mg/200 mL premix  Status:  Discontinued        1,000 mg 200 mL/hr over 60 Minutes Intravenous Every 12 hours 02/14/20 2226 02/16/20 0742   02/14/20 2245  piperacillin-tazobactam (ZOSYN) IVPB 3.375 g  Status:  Discontinued        3.375 g 12.5 mL/hr over 240 Minutes Intravenous Every 8 hours 02/14/20 2226 02/16/20 0742   02/14/20 2245  vancomycin (VANCOREADY) IVPB 1500 mg/300 mL        1,500 mg 150 mL/hr over 120 Minutes Intravenous  Once 02/14/20 2226 02/15/20 0739   02/01/20 2200  amoxicillin-clavulanate (AUGMENTIN) 250-62.5 MG/5ML suspension 500 mg        500 mg Per Tube Every 8 hours 02/01/20 1343 02/06/20 1333   01/31/20 1015  levofloxacin (LEVAQUIN) 25 MG/ML solution 500 mg  Status:  Discontinued        500 mg Per Tube Daily 01/31/20 0919 02/01/20 1343   01/31/20 1000  cefTRIAXone (ROCEPHIN) 1 g in sodium chloride 0.9 % 100 mL IVPB  Status:  Discontinued        1 g 200 mL/hr over 30 Minutes Intravenous Every 24 hours 01/31/20 0905 01/31/20 0919  01/19/20 2200  linezolid (ZYVOX) tablet 600 mg        600 mg Per Tube Every 12 hours 01/19/20 1534 01/25/20 2019   01/19/20 1100  levofloxacin (LEVAQUIN) tablet 500 mg  Status:  Discontinued        500 mg Per Tube Daily 01/19/20 1004  01/19/20 1534   12/19/19 0930  Darunavir-Cobicisctat-Emtricitabine-Tenofovir Alafenamide (SYMTUZA) 800-150-200-10 MG TABS 1 tablet        1 tablet Oral Daily with breakfast 12/19/19 0840     12/13/19 1000  bictegravir-emtricitabine-tenofovir AF (BIKTARVY) 50-200-25 MG per tablet 1 tablet  Status:  Discontinued        1 tablet Oral Daily 12/12/19 1413 12/19/19 0840   11/17/19 1548  ceFAZolin (ANCEF) 2-4 GM/100ML-% IVPB       Note to Pharmacy: Domenick Bookbinder   : cabinet override      11/17/19 1548 11/18/19 0359   11/16/19 1515  ceFAZolin (ANCEF) IVPB 2g/100 mL premix        2 g 200 mL/hr over 30 Minutes Intravenous To Radiology 11/16/19 1511 11/17/19 1625   11/15/19 0900  sulfamethoxazole-trimethoprim (BACTRIM DS) 800-160 MG per tablet 1 tablet        1 tablet Per Tube Once per day on Mon Wed Fri 11/13/19 1906     11/13/19 1615  dolutegravir (TIVICAY) tablet 50 mg  Status:  Discontinued        50 mg Oral Daily 11/13/19 1521 12/12/19 1413   11/13/19 1615  emtricitabine-tenofovir AF (DESCOVY) 200-25 MG per tablet 1 tablet  Status:  Discontinued        1 tablet Per Tube Daily 11/13/19 1521 12/12/19 1413   11/13/19 1530  sulfamethoxazole-trimethoprim (BACTRIM DS) 800-160 MG per tablet 1 tablet  Status:  Discontinued        1 tablet Oral Once per day on Mon Wed Fri 11/13/19 1441 11/13/19 1906   11/13/19 1500  bictegravir-emtricitabine-tenofovir AF (BIKTARVY) 50-200-25 MG per tablet 1 tablet  Status:  Discontinued        1 tablet Oral Daily 11/13/19 1403 11/13/19 1521   11/10/19 1000  erythromycin 250 mg in sodium chloride 0.9 % 100 mL IVPB        250 mg 100 mL/hr over 60 Minutes Intravenous Every 8 hours 11/10/19 0907 11/11/19 2000   11/07/19 1200  ceFEPIme (MAXIPIME) 2 g in sodium chloride 0.9 % 100 mL IVPB        2 g 200 mL/hr over 30 Minutes Intravenous Every 8 hours 11/07/19 1013 11/13/19 2127   11/03/19 1200  cefTRIAXone (ROCEPHIN) 2 g in sodium chloride 0.9 % 100 mL IVPB        2 g 200  mL/hr over 30 Minutes Intravenous Every 24 hours 11/03/19 0922 11/05/19 1427   11/02/19 0800  vancomycin (VANCOREADY) IVPB 750 mg/150 mL  Status:  Discontinued        750 mg 150 mL/hr over 60 Minutes Intravenous Every 12 hours 11/01/19 1935 11/02/19 1105   11/02/19 0600  piperacillin-tazobactam (ZOSYN) IVPB 3.375 g  Status:  Discontinued        3.375 g 12.5 mL/hr over 240 Minutes Intravenous Every 8 hours 11/02/19 0311 11/03/19 0920   11/01/19 2200  ceFEPIme (MAXIPIME) 2 g in sodium chloride 0.9 % 100 mL IVPB  Status:  Discontinued        2 g 200 mL/hr over 30 Minutes Intravenous Every 12 hours 11/01/19 2143 11/02/19 0311   11/01/19 1915  vancomycin (VANCOREADY) IVPB 1500  mg/300 mL        1,500 mg 150 mL/hr over 120 Minutes Intravenous  Once 11/01/19 1909 11/01/19 2147   11/01/19 1845  cefTRIAXone (ROCEPHIN) 1 g in sodium chloride 0.9 % 100 mL IVPB        1 g 200 mL/hr over 30 Minutes Intravenous  Once 11/01/19 1842 11/01/19 2051   11/01/19 1845  azithromycin (ZITHROMAX) 500 mg in sodium chloride 0.9 % 250 mL IVPB        500 mg 250 mL/hr over 60 Minutes Intravenous  Once 11/01/19 1842 11/01/19 2051       Time spent: 20 minutes    Erin Hearing ANP  [facility]  7 am-330 pm/M-F for direct patient care and secure chat Please refer to [place] for contact information 03/22/2020, 7:52 AM  LOS: 142 days

## 2020-03-23 NOTE — Progress Notes (Signed)
Please review progress note by [name] from 2/18 for details regarding patient's hospital stay.  No overnight issues reported by nursing staff.  Patient appears to be lying comfortably on the bed.  He is nonverbal.   Vital signs reviewed and noted to be stable. Tracheostomy is noted with trach collar Lungs are clear to auscultation bilaterally with no wheezing rales or rhonchi S1-S2 is normal regular. Abdomen is soft.  PEG tube is noted.  CBGs are stable.  Labs last checked on 2/14 and were noted to be stable.  Will check it once a week.  55 year old male with history of HIV, chronic alcohol abuse was found unresponsive and pulseless in PEA.  He was successfully resuscitated and brought to the hospital.  Noted to have anoxic brain injury.  He has had a long hospital stay.  He is currently on trach collar.  Also had a PEG tube placed. All of his issues appear to be stable.    Medication list reviewed.   He gets bolus feeding through the PEG tube.  Remains on Keppra for seizure disorder. Please see progress note from 2/18 for details.  We will continue to monitor patient closely.  Discussed with nursing staff.   [name] 03/23/2020

## 2020-03-24 LAB — GLUCOSE, CAPILLARY: Glucose-Capillary: 186 mg/dL — ABNORMAL HIGH (ref 70–99)

## 2020-03-24 NOTE — Progress Notes (Signed)
Please review progress note by [name] from 2/18 for details regarding patient's hospital stay.  No overnight issues reported by nursing staff.  Patient noted to be lying comfortably on the bed.  He is nonverbal.    Vital signs remained stable. Lungs are clear to auscultation bilaterally with no wheezing rales or rhonchi S1 S2 is normal regular Tracheostomy is noted. Abdomen is soft.  PEG tube is noted.  Labs last checked on 2/14 and were noted to be stable.  Will check it once a week.  55 year old male with history of HIV, chronic alcohol abuse was found unresponsive and pulseless in PEA.  He was successfully resuscitated and brought to the hospital.  Noted to have anoxic brain injury.  He has had a long hospital stay.  He is currently on trach collar.  Also had a PEG tube placed. He gets bolus feeding through the PEG tube.  Remains on Keppra for seizure disorder.  Patient's medical issues noted to be stable at this time. Please see progress note from 2/18 for details.   We will continue to monitor closely.  Order labs for tomorrow.  Discussed with nursing staff.   [name] 03/24/2020

## 2020-03-25 LAB — COMPREHENSIVE METABOLIC PANEL
ALT: 19 U/L (ref 0–44)
AST: 25 U/L (ref 15–41)
Albumin: 3.6 g/dL (ref 3.5–5.0)
Alkaline Phosphatase: 88 U/L (ref 38–126)
Anion gap: 12 (ref 5–15)
BUN: 18 mg/dL (ref 6–20)
CO2: 25 mmol/L (ref 22–32)
Calcium: 10.5 mg/dL — ABNORMAL HIGH (ref 8.9–10.3)
Chloride: 104 mmol/L (ref 98–111)
Creatinine, Ser: 0.64 mg/dL (ref 0.61–1.24)
GFR, Estimated: >60 mL/min (ref >60)
Glucose, Bld: 101 mg/dL — ABNORMAL HIGH (ref 70–99)
Potassium: 3.9 mmol/L (ref 3.5–5.1)
Sodium: 141 mmol/L (ref 135–145)
Total Bilirubin: 0.4 mg/dL (ref 0.3–1.2)
Total Protein: 8 g/dL (ref 6.5–8.1)

## 2020-03-25 LAB — GLUCOSE, CAPILLARY
Glucose-Capillary: 101 mg/dL — ABNORMAL HIGH (ref 70–99)
Glucose-Capillary: 102 mg/dL — ABNORMAL HIGH (ref 70–99)
Glucose-Capillary: 105 mg/dL — ABNORMAL HIGH (ref 70–99)
Glucose-Capillary: 108 mg/dL — ABNORMAL HIGH (ref 70–99)
Glucose-Capillary: 109 mg/dL — ABNORMAL HIGH (ref 70–99)
Glucose-Capillary: 110 mg/dL — ABNORMAL HIGH (ref 70–99)
Glucose-Capillary: 113 mg/dL — ABNORMAL HIGH (ref 70–99)
Glucose-Capillary: 118 mg/dL — ABNORMAL HIGH (ref 70–99)
Glucose-Capillary: 126 mg/dL — ABNORMAL HIGH (ref 70–99)
Glucose-Capillary: 129 mg/dL — ABNORMAL HIGH (ref 70–99)
Glucose-Capillary: 162 mg/dL — ABNORMAL HIGH (ref 70–99)
Glucose-Capillary: 168 mg/dL — ABNORMAL HIGH (ref 70–99)
Glucose-Capillary: 178 mg/dL — ABNORMAL HIGH (ref 70–99)
Glucose-Capillary: 67 mg/dL — ABNORMAL LOW (ref 70–99)
Glucose-Capillary: 75 mg/dL (ref 70–99)
Glucose-Capillary: 80 mg/dL (ref 70–99)
Glucose-Capillary: 91 mg/dL (ref 70–99)
Glucose-Capillary: 92 mg/dL (ref 70–99)

## 2020-03-25 LAB — CBC
HCT: 44.8 % (ref 39.0–52.0)
Hemoglobin: 14.3 g/dL (ref 13.0–17.0)
MCH: 30.4 pg (ref 26.0–34.0)
MCHC: 31.9 g/dL (ref 30.0–36.0)
MCV: 95.1 fL (ref 80.0–100.0)
Platelets: 153 10*3/uL (ref 150–400)
RBC: 4.71 MIL/uL (ref 4.22–5.81)
RDW: 13 % (ref 11.5–15.5)
WBC: 3.7 10*3/uL — ABNORMAL LOW (ref 4.0–10.5)
nRBC: 0 % (ref 0.0–0.2)

## 2020-03-25 NOTE — Progress Notes (Signed)
[facility] PROGRESS NOTE  Ryan Orozco EKC:[id number] DOB: [birth year] DOA: 11/01/2019 PCP: Default, Provider, MD       12/19/19                          03/15/20 OOB to chair   Status: Remains inpatient appropriate because:Altered mental status, Unsafe d/c plan and Inpatient level of care appropriate due to severity of illness. Patient newly diagnosed with anoxic brain injury  Dispo: The patient is from: Home              Anticipated d/c is to: SNF              Anticipated d/c date is: > 3 days              Patient currently is medically stable to d/c.  Barriers to discharge: No SNF bed offers.  Needs trach capable facility.  Still awaiting approval of Medicaid and disability.  No bed offers. Facility to review today 2/18   Code Status: DNR after d/w palliative team on 1/27 Family Communication: 2/11 updated dtr [name] via text to include picture of her father up in chair-she will be in [place] on 2/21 DVT prophylaxis: Subcutaneous heparin Vaccination status: Will order first dose Pfizer COVID-vaccine on 1/10; next week we will order pneumococcal and influenza vaccinations-2nd dose ordered 277  Foley catheter:  No  HPI: 55 year old male with HIV, chronic alcohol abuse who was presented to the emergency department after being found unresponsive outside a convinient store.  EMS found him pulseless in PEA, unknown downtime, successfully resuscitated and brought to the emergency department.  UDS positive for THC and benzodiazepines.  CT head unremarkable.  Failed extubation trials due to severe anoxic brain injury and had tracheostomy placed.  PEG placed on 10/15.  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  This has led to significant pain as well.  [name] was consulted and recommendations/input/orders highly appreciated.  She may also benefit from Botox injections to both hips to aid in  treatment of spasticity.   Significant Hospital Events   9/29 Admit post PEA arrest. UDS positive for THC, benzo's. ETOH 177 10/05 EEG ongoing. Versed restarted overnight due to agitation. Nicardipine increased.  10/06 Developed tachypnea with WUA, no follow commands off sedation  10/07 Vomiting, TF held / restarted with recurrent vomiting 10/16 tracheostomy collar for 9 hours 10/18->10/19 off vent all night  10/20> TC 11/15 > downsized to #4 Shiley flex CFS  Subjective: Awake, at baseline mentation.  Interactive.  Smiled when informed that his daughter was arriving to town this week.  Objective: Vitals:   03/24/20 2000 03/24/20 2217  BP:  109/79  Pulse: 84 89  Resp: 18 20  Temp:  (!) 97.4 F (36.3 C)  SpO2: 97% 98%    Intake/Output Summary (Last 24 hours) at 03/25/2020 0738 Last data filed at 03/24/2020 1820 Gross per 24 hour  Intake --  Output 1000 ml  Net -1000 ml   Filed Weights   03/15/20 0742 03/24/20 0500 03/25/20 0500  Weight: 74 kg 73.5 kg 73.5 kg    Exam: General: Awake, alert and at baseline mentation and in no acute distress. Pulmonary: Lung sounds are clear to auscultation anteriorly and no increased work of breathing FiO2 21% via humidified trach without any trach secretions detected. Cardiac: S1-S2, pulse regular nontachycardic, extremities without edema Abdomen: Abdomen is soft nondistended and nontender.  PEG tube in place for feedings and medications, LBM 2/16 Neurological: Severe hypertonicity/spasticity bilateral lower extremities involving the hip flexors and hamstrings;  keeps bilateral hips flexed -continues to have pain w/ attempts to extend either leg especially the right leg.  Nonpurposeful use of upper extremities.   Psychiatric: Alert.  Unable to verbally communicate but does respond with monosyllabic attempts at words and will smile appropriately. Skin: Wound on dorsum of left foot covered with dressing.  No periwound  erythema.  Assessment/Plan: Acute problems: Anoxic brain injury 2/2 PEA arrest/pain syndrome secondary to hypertonicity/spastic tetraplegia:  -Presumed 2/2 combination alcohol and BZD overdose with UDS positive for THC and BZDs -Significant brain injury/spastic tetraplegia -needs long-term SNF -Continue baclofen, gabapentin, scheduled oxycodone and Zanaflex for spastic tetraplegia -1/6 [name] with rehabilitative medicine performed Botox injection into the quadriceps and rectus femoris as well as the biceps femoris and hamstrings, semimembranosus and semitendinosus.  Unable to inject iliopsoas without EMG guidance w/o significant improvement and spasticity hypertonicity. -Continue bilateral UE WHO resting splints/PROM q 4 hours/KREG bed -Continue Seroquel  -Continue out of bed to chair with lift  Blister dorsum left foot  03/15/20  -Possibly pressure related -Appreciate wound care nurse assistance.  Superficial skin from blister debrided and Xeroform gauze applied to open wound and was covered with a foam dressing with recommendations to change the Xerofoam daily and the foam dressing every 3 days or as needed for soiling  Goals of care:  -Palliative care team reconsulted on 1/27 and after an extensive discussion daughter does realize that patient quite debilitated and would not survive a resuscitation therefore patient made a DNR  Chronic respiratory failure with inability to maintain patent airway requiring chronic tracheostomy tube/Recent Corynebacterium tracheobronchitis (resolved) -Continue #4 cuffless trach in place-trach changed out on 1/10 RT -Given altered mentation and inability to manage secretions PCCM states patient is not a candidate for elective decannulation  Fever/peri-PEG drainage positive for corynebacterium and rare Pseudomonas -PEG site with some drainage but no erythema, induration or pain -Suspect may have colonization noting recently had corynebacterium  tracheobronchitis treated -continue bacitracin ointment  HIV:  -CD4 count of 124 October 2021 (had been as high as 250 Dec 2020)-as of today up to 259; viral load obtained on 1/15 was <20 -Dc'd Tivicay and Descovy infavor of Biktarvy to for eventual dc to SNF; per my discussion with infectious disease physician [name] on 12/23 preferred regimen would be Descovy and Tivicay-she will contact the HIV pharmacist to attempt to obtain discount cards to make this a more affordable option.  Once this has been confirmed will transition back to Descovy and Tivicay -CD4 count on 12/23 up to 259 -ID recommends continuing prophylactic Bactrim an additional 3 months (last dose 04/24/2020)  Dysphagia 2/2 anoxic brain injury -Continue tube feeding per PEG -Given altered mentation patient not a candidate for further speech evaluations and will be permanently dependent on enteral tube feedings  Constipation -We will continue scheduled Colace, fiber supplements per tube as well as daily MiraLAX -Suspect requirement for narcotics and multiple muscle relaxers contributing to decreased GI motility and constipation symptoms  Protein calorie malnutrition nutrition Status: Nutrition Problem: Increased nutrient needs Etiology: acute illness Signs/Symptoms: estimated needs Interventions: Tube feeding bolus doses of Osmolite, Juven twice daily, Prosource TF 45 mL 3 times daily Estimated body mass index is 26.15 kg/m as calculated from the following:   Height as of this encounter: 5' 6"  (1.676 m).   Weight as of this encounter: 73.5 kg.  Multiple decubiti not POA Wound / Incision (Open or Dehisced) 11/17/19 Puncture Abdomen Left;Anterior;Upper G-tube insertion site  (Active)  Date First Assessed/Time First Assessed: 11/17/19 1646   Wound Type: Puncture  Location: Abdomen  Location Orientation: Left;Anterior;Upper  Wound Description (Comments): G-tube insertion site   Present on Admission: No    Assessments  11/17/2019  4:47 PM 03/20/2020 10:00 AM  Dressing Type Gauze (Comment);Tape dressing --  Dressing Changed New --  Dressing Status Clean;Dry;Intact --  Dressing Change Frequency PRN --  Site / Wound Assessment Clean;Dry;Pink --  Peri-wound Assessment Intact --  Wound Length (cm) -- 0 cm  Wound Width (cm) -- 0 cm  Wound Depth (cm) -- 0 cm  Wound Volume (cm^3) -- 0 cm^3  Wound Surface Area (cm^2) -- 0 cm^2  Closure None --  Drainage Amount None --  Treatment Cleansed --     No Linked orders to display     Wound / Incision (Open or Dehisced) 03/16/20 Other (Comment) Foot Anterior;Left (Active)  Date First Assessed/Time First Assessed: 03/16/20 0800   Wound Type: (c) Other (Comment)  Location: Foot  Location Orientation: Anterior;Left  Present on Admission: No    Assessments 03/16/2020  8:00 AM 03/22/2020  7:58 PM  Dressing Type Other (Comment) Foam - Lift dressing to assess site every shift  Dressing Changed Changed Changed  Dressing Status Old drainage --  Dressing Change Frequency Daily --  Site / Wound Assessment Pink;Red --  Peri-wound Assessment Intact --  Drainage Amount Minimal --  Drainage Description Serosanguineous --  Treatment Cleansed --     No Linked orders to display      Other problems: Hypertension/grade 1 diastolic dysfunction:  -Continue amlodipine  -Echocardiogram this admission with preserved LVEF with evidence of grade   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   -Continue Keppra; levetiracetam level 35.9 on 11/12 -no further seizure activity since transitioned out of ICU setting therefore will discontinue seizure precaution  SIRS/Fever 2/2 recurrent enterococcus UTI -Patient developed mild elevation in temperature overnight to 1/13 associated with slight increase in respiratory secretions and congested sounding cough/abnormal pulmonary exam as well as tachycardia./14 respiratory status much improved and no further fevers. -Respiratory  viral panel neg for COVID, influenza and RSV.   -UA slightly abnormal/cx c/w enterococcus UTI- started on nitrofurantoin (prior UTI treated w/ augmentin) has completed 3 days of treatment -X-ray with bilateral opacities concerning for early pneumonia but CT of the chest without pneumonia, broad-spectrum antibiotics dc'd 1/14 -Because of dehydration increased frequency of free water from 200 cc every 4 hours to every 2 hours on 1/13- stopped on 1/15  Low-grade fever/abnormal urinalysis -2/8: Solitary T-max 100.5 overnight.  Likely related to atelectasis.  Obtain chest x-ray today that was unremarkable -As a precaution we will go ahead and obtain urinalysis and culture and blood cultures; electrolytes unremarkable, LDH normal at 178, no leukocytosis -Urinalysis abnormal with hazy appearance, trace leukocytes, rare bacteria and 11-20 WBCs-urine culture was never obtained and subsequently fever has resolved -Blood cultures are negative -Chest x-ray obtained on 2/14 unremarkable  Inguinal hernias -Evaluated by general surgery this admission. Documented as chronically incarcerated. Patient has been clinically stable and tolerating tube feedings and having bowel movements so unless patient obstructed would not be considered a candidate for repair. Even if obstructed consulting surgeon was not sure he would be a candidate for hernia repair regardless  Constipation -No documented bowel movement since 1/13 -We will continue scheduled Colace, fiber supplements per tube -Add  daily MiraLAX -Given one-time dose milk of magnesia on 1/20-no documented bowel movement on flowsheet -Suspect requirement for narcotics and multiple muscle relaxers contributing to decreased GI motility and constipation symptoms  Goals of care:  -Palliative care was involved during this hospitalization.   -Goals of care were discussed. remains full code.  -Palliative care recommended outpatient follow-up with palliative  providers. -Reconsult Palliative medicine 1/27   Data Reviewed: Basic Metabolic Panel: No results for input(s): NA, K, CL, CO2, GLUCOSE, BUN, CREATININE, CALCIUM, MG, PHOS in the last 168 hours. Liver Function Tests: No results for input(s): AST, ALT, ALKPHOS, BILITOT, PROT, ALBUMIN in the last 168 hours. No results for input(s): LIPASE, AMYLASE in the last 168 hours. No results for input(s): AMMONIA in the last 168 hours. CBC: No results for input(s): WBC, NEUTROABS, HGB, HCT, MCV, PLT in the last 168 hours. Cardiac Enzymes: No results for input(s): CKTOTAL, CKMB, CKMBINDEX, TROPONINI in the last 168 hours. BNP (last 3 results) Recent Labs    02/15/20 0433  BNP 3.9    ProBNP (last 3 results) No results for input(s): PROBNP in the last 8760 hours.  CBG: Recent Labs  Lab 03/22/20 1202 03/22/20 2015 03/22/20 2343 03/24/20 1936 03/25/20 0003  GLUCAP 155* 73 96 186* 102*    No results found for this or any previous visit (from the past 240 hour(s)).   Studies: No results found.  Scheduled Meds: . acetaminophen (TYLENOL) oral liquid 160 mg/5 mL  650 mg Per Tube Q6H  . amLODipine  10 mg Per Tube Daily  . bacitracin   Topical BID  . baclofen  30 mg Per Tube TID  . chlorhexidine gluconate (MEDLINE KIT)  15 mL Mouth Rinse BID  . Darunavir-Cobicisctat-Emtricitabine-Tenofovir Alafenamide  1 tablet Oral Q breakfast  . famotidine  10.4 mg Per Tube BID  . feeding supplement (OSMOLITE 1.5 CAL)  237 mL Per Tube Q24H  . feeding supplement (OSMOLITE 1.5 CAL)  474 mL Per Tube TID  . feeding supplement (PROSource TF)  45 mL Per Tube TID  . fiber  1 packet Per Tube BID  . folic acid  1 mg Per Tube Daily  . free water  200 mL Per Tube Q4H  . gabapentin  200 mg Per Tube Q8H  . heparin injection (subcutaneous)  5,000 Units Subcutaneous Q8H  . levETIRAcetam  1,500 mg Per Tube BID  . magic mouthwash w/lidocaine  5 mL Oral QID  . Muscle Rub   Topical QID  . oxyCODONE  5 mg Per Tube  Q6H  . polyethylene glycol  17 g Per Tube QHS  . QUEtiapine  12.5 mg Per Tube QHS  . scopolamine  1 patch Transdermal Q72H  . sulfamethoxazole-trimethoprim  1 tablet Per Tube Once per day on Mon Wed Fri  . thiamine  100 mg Per Tube Daily  . 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)   Dysphagia   Protein-calorie malnutrition ([facility])   Seizures ([place])   Bilateral recurrent inguinal hernia   Muscle spasticity  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HCC)   Tracheobronchitis   Sequela of Corynebacterium infection   Abnormal urine   Urinary tract infection due to Enterococcus   Fever   Consultants:  PCCM  Palliative medicine  General surgery  Neurology  Interventional radiology  Procedures:  EEG  Echocardiogram  Tracheostomy  Antibiotics: Anti-infectives (From admission, onward)   Start     Dose/Rate Route Frequency Ordered Stop   02/17/20 1100  nitrofurantoin (FURADANTIN) 25 MG/5ML suspension 100 mg        100 mg Per Tube Every 12 hours 02/17/20 1006 02/20/20 0924   02/15/20 1000  vancomycin (VANCOCIN) IVPB 1000 mg/200 mL premix  Status:  Discontinued        1,000 mg 200 mL/hr over 60 Minutes Intravenous Every 12 hours 02/14/20 2226 02/16/20 0742   02/14/20 2245  piperacillin-tazobactam (ZOSYN) IVPB 3.375 g  Status:  Discontinued        3.375 g 12.5 mL/hr over 240 Minutes Intravenous Every 8 hours 02/14/20 2226 02/16/20 0742   02/14/20 2245  vancomycin (VANCOREADY) IVPB 1500 mg/300 mL        1,500 mg 150 mL/hr over 120 Minutes Intravenous   Once 02/14/20 2226 02/15/20 0739   02/01/20 2200  amoxicillin-clavulanate (AUGMENTIN) 250-62.5 MG/5ML suspension 500 mg        500 mg Per Tube Every 8 hours 02/01/20 1343 02/06/20 1333   01/31/20 1015  levofloxacin (LEVAQUIN) 25 MG/ML solution 500 mg  Status:  Discontinued        500 mg Per Tube Daily 01/31/20 0919 02/01/20 1343   01/31/20 1000  cefTRIAXone (ROCEPHIN) 1 g in sodium chloride 0.9 % 100 mL IVPB  Status:  Discontinued        1 g 200 mL/hr over 30 Minutes Intravenous Every 24 hours 01/31/20 0905 01/31/20 0919   01/19/20 2200  linezolid (ZYVOX) tablet 600 mg        600 mg Per Tube Every 12 hours 01/19/20 1534 01/25/20 2019   01/19/20 1100  levofloxacin (LEVAQUIN) tablet 500 mg  Status:  Discontinued        500 mg Per Tube Daily 01/19/20 1004 01/19/20 1534   12/19/19 0930  Darunavir-Cobicisctat-Emtricitabine-Tenofovir Alafenamide (SYMTUZA) 800-150-200-10 MG TABS 1 tablet        1 tablet Oral Daily with breakfast 12/19/19 0840     12/13/19 1000  bictegravir-emtricitabine-tenofovir AF (BIKTARVY) 50-200-25 MG per tablet 1 tablet  Status:  Discontinued        1 tablet Oral Daily 12/12/19 1413 12/19/19 0840   11/17/19 1548  ceFAZolin (ANCEF) 2-4 GM/100ML-% IVPB       Note to Pharmacy: Domenick Bookbinder   : cabinet override      11/17/19 1548 11/18/19 0359   11/16/19 1515  ceFAZolin (ANCEF) IVPB 2g/100 mL premix        2 g 200 mL/hr over 30 Minutes Intravenous To Radiology 11/16/19 1511 11/17/19 1625   11/15/19 0900  sulfamethoxazole-trimethoprim (BACTRIM DS) 800-160 MG per tablet 1 tablet        1 tablet Per Tube Once per day on Mon Wed Fri 11/13/19 1906     11/13/19 1615  dolutegravir (TIVICAY) tablet 50 mg  Status:  Discontinued        50 mg Oral Daily 11/13/19 1521 12/12/19 1413   11/13/19 1615  emtricitabine-tenofovir AF (DESCOVY) 200-25 MG per tablet 1 tablet  Status:  Discontinued        1 tablet Per Tube Daily 11/13/19 1521 12/12/19  1413   11/13/19 1530   sulfamethoxazole-trimethoprim (BACTRIM DS) 800-160 MG per tablet 1 tablet  Status:  Discontinued        1 tablet Oral Once per day on Mon Wed Fri 11/13/19 1441 11/13/19 1906   11/13/19 1500  bictegravir-emtricitabine-tenofovir AF (BIKTARVY) 50-200-25 MG per tablet 1 tablet  Status:  Discontinued        1 tablet Oral Daily 11/13/19 1403 11/13/19 1521   11/10/19 1000  erythromycin 250 mg in sodium chloride 0.9 % 100 mL IVPB        250 mg 100 mL/hr over 60 Minutes Intravenous Every 8 hours 11/10/19 0907 11/11/19 2000   11/07/19 1200  ceFEPIme (MAXIPIME) 2 g in sodium chloride 0.9 % 100 mL IVPB        2 g 200 mL/hr over 30 Minutes Intravenous Every 8 hours 11/07/19 1013 11/13/19 2127   11/03/19 1200  cefTRIAXone (ROCEPHIN) 2 g in sodium chloride 0.9 % 100 mL IVPB        2 g 200 mL/hr over 30 Minutes Intravenous Every 24 hours 11/03/19 0922 11/05/19 1427   11/02/19 0800  vancomycin (VANCOREADY) IVPB 750 mg/150 mL  Status:  Discontinued        750 mg 150 mL/hr over 60 Minutes Intravenous Every 12 hours 11/01/19 1935 11/02/19 1105   11/02/19 0600  piperacillin-tazobactam (ZOSYN) IVPB 3.375 g  Status:  Discontinued        3.375 g 12.5 mL/hr over 240 Minutes Intravenous Every 8 hours 11/02/19 0311 11/03/19 0920   11/01/19 2200  ceFEPIme (MAXIPIME) 2 g in sodium chloride 0.9 % 100 mL IVPB  Status:  Discontinued        2 g 200 mL/hr over 30 Minutes Intravenous Every 12 hours 11/01/19 2143 11/02/19 0311   11/01/19 1915  vancomycin (VANCOREADY) IVPB 1500 mg/300 mL        1,500 mg 150 mL/hr over 120 Minutes Intravenous  Once 11/01/19 1909 11/01/19 2147   11/01/19 1845  cefTRIAXone (ROCEPHIN) 1 g in sodium chloride 0.9 % 100 mL IVPB        1 g 200 mL/hr over 30 Minutes Intravenous  Once 11/01/19 1842 11/01/19 2051   11/01/19 1845  azithromycin (ZITHROMAX) 500 mg in sodium chloride 0.9 % 250 mL IVPB        500 mg 250 mL/hr over 60 Minutes Intravenous  Once 11/01/19 1842 11/01/19 2051       Time  spent: 20 minutes    Erin Hearing ANP  [facility]  7 am-330 pm/M-F for direct patient care and secure chat Please refer to Amion for contact information 03/25/2020, 7:38 AM  LOS: 145 days

## 2020-03-25 NOTE — Progress Notes (Signed)
Per Dr. Katrinka Blazing, okay to change trach is 2 more weeks (04/08/20).

## 2020-03-25 NOTE — NC FL2 (Signed)
[place] LEVEL OF CARE SCREENING TOOL     IDENTIFICATION  Patient Name: Ryan Orozco Birthdate: [birth year] Sex: male Admission Date (Current Location): 11/01/2019  [facility] and [place] Number:  Herbalist and Address:  [facility], [place] [address] [place], [place] [address]      Provider Number: [id number]  Attending Physician Name and Address:  Bonnielee Haff, MD  Relative Name and Phone Number:  Cheyenne Adas [phone]    Current Level of Care: Hospital Recommended Level of Care: [place] Prior Approval Number:    Date Approved/Denied:   PASRR Number: [id number]  Discharge Plan: SNF    Current Diagnoses: Patient Active Problem List   Diagnosis Date Noted  . Fever   . Urinary tract infection due to Enterococcus   . Abnormal urine   . Tracheobronchitis   . Sequela of Corynebacterium infection   . 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 Muscle spasticity   . Diastolic dysfunction 38/93/7342  . 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 Dysphagia 12/08/2019  . Protein-calorie malnutrition ([place]) 12/08/2019  . Seizures ([place]) 12/08/2019  . Bilateral recurrent inguinal hernia 12/08/2019  . Chronic respiratory failure ([place])   . Small bowel obstruction ([place])   . Palliative care encounter   . Bowel obstruction ([place])   . Pressure injury of skin 11/14/2019  . Vomiting   . Acute respiratory failure with hypoxia ([place])   . Community acquired pneumonia of left upper lobe of lung   . Anoxic brain injury ([place])   . Alcohol abuse   . Cardiac arrest ([place]) 11/01/2019  . HIV (human immunodeficiency virus infection) ([place]) 02/18/2018  . Healthcare maintenance 02/18/2018  . Hypertension 02/18/2018    Orientation RESPIRATION BLADDER Height & Weight      (unable to assess trach patient)  Tracheostomy,O2 (Trach collar @ 21%) External catheter Weight: 162 lb 0.6 oz (73.5 kg) Height:   5' 6"  (167.6 cm)  BEHAVIORAL SYMPTOMS/MOOD NEUROLOGICAL BOWEL NUTRITION STATUS  Other (Comment) (Agitation/ restless/ cooperative) Convulsions/Seizures Incontinent Feeding tube  AMBULATORY STATUS COMMUNICATION OF NEEDS Skin   Total Care Non-Verbally Normal,Other (Comment) (Superficial pressure ulcer on foot)                       Personal Care Assistance Level of Assistance  Bathing,Feeding,Dressing,Total care Bathing Assistance: Maximum assistance Feeding assistance: Maximum assistance (Tube feeds - PEG) Dressing Assistance: Maximum assistance Total Care Assistance: Maximum assistance   Functional Limitations Info  Sight,Hearing,Speech Sight Info: Adequate Hearing Info: Adequate Speech Info: Impaired    SPECIAL CARE FACTORS FREQUENCY  PT (By licensed PT),OT (By licensed OT)     PT Frequency: 5x weekly OT Frequency: 5x weekly            Contractures Contractures Info: Not present    Additional Factors Info  Code Status,Allergies Code Status Info: DNR Allergies Info: Tomatoes Psychotropic Info: Seroquel, Ativan, Keppra Insulin Sliding Scale Info: See d/c summary for slidiing scale info Isolation Precautions Info: None     Current Medications (03/25/2020):  This is the current hospital active medication list Current Facility-Administered Medications  Medication Dose Route Frequency Provider Last Rate Last Admin  . acetaminophen (TYLENOL) 160 MG/5ML solution 650 mg  650 mg Per Tube Q6H Samella Parr, NP   650 mg at 03/25/20 1235  . acetaminophen (TYLENOL) 160 MG/5ML solution 650 mg  650 mg Per Tube Q6H PRN Shela Leff, MD   650 mg  at 03/16/20 3545  . albuterol (PROVENTIL) (2.5 MG/3ML) 0.083% nebulizer solution 2.5 mg  2.5 mg Nebulization Q6H PRN Barb Merino, MD      . amLODipine (NORVASC) tablet 10 mg  10 mg Per Tube Daily Erick Colace, NP   10 mg at 03/25/20 0903  . bacitracin ointment   Topical BID Samella Parr, NP   Given at 03/25/20 [phone]  .  baclofen (LIORESAL) tablet 30 mg  30 mg Per Tube TID Samella Parr, NP   30 mg at 03/25/20 0902  . bisacodyl (DULCOLAX) suppository 10 mg  10 mg Rectal Daily PRN Eulogio Bear U, DO      . chlorhexidine gluconate (MEDLINE KIT) (PERIDEX) 0.12 % solution 15 mL  15 mL Mouth Rinse BID Erick Colace, NP   15 mL at 03/25/20 0904  . Darunavir-Cobicisctat-Emtricitabine-Tenofovir Alafenamide (SYMTUZA) 800-150-200-10 MG TABS 1 tablet  1 tablet Oral Q breakfast [place] Eric J, DO   1 tablet at 03/25/20 3893  . docusate (COLACE) 50 MG/5ML liquid 100 mg  100 mg Per Tube BID PRN Erick Colace, NP   100 mg at 03/22/20 1018  . famotidine (PEPCID) 40 MG/5ML suspension 10.4 mg  10.4 mg Per Tube BID Samella Parr, NP   10.4 mg at 03/25/20 0903  . feeding supplement (OSMOLITE 1.5 CAL) liquid 237 mL  237 mL Per Tube Q24H Shawna Clamp, MD 0 mL/hr at 03/10/20 2146 237 mL at 03/24/20 2009  . feeding supplement (OSMOLITE 1.5 CAL) liquid 474 mL  474 mL Per Tube TID Shawna Clamp, MD 0 mL/hr at 03/10/20 1854 474 mL at 03/25/20 1234  . feeding supplement (PROSource TF) liquid 45 mL  45 mL Per Tube TID Terrilee Croak, MD   45 mL at 03/25/20 0903  . fiber (NUTRISOURCE FIBER) 1 packet  1 packet Per Tube BID Shawna Clamp, MD   1 packet at 03/25/20 [phone]  . folic acid (FOLVITE) tablet 1 mg  1 mg Per Tube Daily Erick Colace, NP   1 mg at 03/25/20 8768  . free water 200 mL  200 mL Per Tube Q4H Barb Merino, MD   200 mL at 03/25/20 1235  . gabapentin (NEURONTIN) 250 MG/5ML solution 200 mg  200 mg Per Tube Q8H Samella Parr, NP   200 mg at 03/25/20 1237  . heparin injection 5,000 Units  5,000 Units Subcutaneous Q8H Erick Colace, NP   5,000 Units at 03/25/20 1234  . levETIRAcetam (KEPPRA) 100 MG/ML solution 1,500 mg  1,500 mg Per Tube BID Erick Colace, NP   1,500 mg at 03/25/20 1157  . LORazepam (ATIVAN) 2 MG/ML concentrated solution 1 mg  1 mg Per Tube Q6H PRN Patrecia Pour, MD   1 mg at 03/10/20 1510  .  magic mouthwash w/lidocaine  5 mL Oral QID Samella Parr, NP   5 mL at 03/25/20 1235  . Muscle Rub CREA   Topical QID Samella Parr, NP   Given at 03/25/20 1236  . oxyCODONE (ROXICODONE) 5 MG/5ML solution 5 mg  5 mg Per Tube Q6H Samella Parr, NP   5 mg at 03/25/20 1235  . polyethylene glycol (MIRALAX / GLYCOLAX) packet 17 g  17 g Per Tube QHS Samella Parr, NP   17 g at 03/22/20 2100  . QUEtiapine (SEROQUEL) tablet 12.5 mg  12.5 mg Per Tube QHS Samella Parr, NP   12.5 mg at 03/24/20 2150  . scopolamine (  TRANSDERM-SCOP) 1 MG/3DAYS 1.5 mg  1 patch Transdermal Q72H Erick Colace, NP   1.5 mg at 03/23/20 0847  . sulfamethoxazole-trimethoprim (BACTRIM DS) 800-160 MG per tablet 1 tablet  1 tablet Per Tube Once per day on Mon Wed Fri Erick Colace, NP   1 tablet at 03/25/20 5051  . thiamine tablet 100 mg  100 mg Per Tube Daily Erick Colace, NP   100 mg at 03/25/20 0712  . tiZANidine (ZANAFLEX) tablet 6 mg  6 mg Per Tube Q6H Samella Parr, NP   6 mg at 03/25/20 1234     Discharge Medications: Please see discharge summary for a list of discharge medications.  Relevant Imaging Results:  Relevant Lab Results:   Additional Information SSN# [id number]  Archie Endo, LCSW

## 2020-03-25 NOTE — Progress Notes (Addendum)
Nutrition Follow-up  DOCUMENTATION CODES:   Not applicable  INTERVENTION:  Continue bolus TF regimen via PEG: -Provide 2 cartons (436m) Osmolite 1.5 TID @ 0800, 1200, 1700 -Provide 1 carton (2315m Osmolite 1.5 once daily at bedtime @ 2100 -Flush with 6072mree water before and after each tube feeding bolus -Provide 42m68mosource TF TID -Provide Nutrisource fiber BID -Additional free water flushes currently 200ml39m  Tube feeding regimen provides 2605 kcals, 137 grams protein,1267ml 69m water (2947ml w38mflushes)  NUTRITION DIAGNOSIS:   Increased nutrient needs related to acute illness as evidenced by estimated needs. -- ongoing  GOAL:   Patient will meet greater than or equal to 90% of their needs -- met with TF  MONITOR:   Labs,Weight trends,TF tolerance,Skin,I & O's  REASON FOR ASSESSMENT:   Ventilator,Consult Enteral/tube feeding initiation and management  ASSESSMENT:   Pt admitted with anoxic brain injury 2/2 PEA arrest/pain syndrome 2/2 hypertonicity/spastic tetraplegia (presumed to be 2/2 combination of EtOH and BZD overdose with UDS positive for THC and BZDs). PMH includes HIV and EtOH use.  09/29 - intubated 10/07- vomiting, TF held, laterrestarted with recurrent vomiting 10/12-R/L inguinal hernia, partial SBO  10/13- trach 10/15-PEG 10/16 - tolerated TC for 9 hours 10/18->10/19 - off vent overnight 10/20- TC 11/15 - trach downsized to #4 Shiley flex CFS 12/06 - trach changed (same #4 Shiley flex CFS) 1/11 - trach changed (same #4 Shiley flex CFS) 2/7 - trach changed (same #4 Shiley flex CFS) 2/14 - CXR unremarkable  Pt remains nonverbal.    Per MD, pt remains medically stable for d/c, but continues to have no SNF bed offers. Pt needs trach-capable facility. Pt continues to await approval of Medicaid and disability.   Pt NPO, continues to receive bolus TF via PEG. Current TF regimen is as follows: 2 cartons (474ml) O19mite 1.5 TID, 1  carton (237ml) Os23mte 1.5 once daily, 60ml free5mer flush before and after each tube feeding bolus + additional FWF of 200ml Q4H, 46m Prosou11mTF TID and Nutrisource fiber BID. Continue with current regimen.   UOP: 1000ml x 24 ho76m Medications: pepcid, folic acid, miralax, thiamine Labs only being ordered once per week per MD;  last labs drawn 2/14, Corrected Ca 11.12 (H), Na 148 (H, additional FWF were added and have since been d/c). Per MD, labs to be repeated today.   CBGs 102-186  Diet Order:   Diet Order    None      EDUCATION NEEDS:   Not appropriate for education at this time  Skin:  Skin Assessment: Skin Integrity Issues: Skin Integrity Issues:: Other (Comment) Stage II: N/A Stage III: N/A Other: puncture abdomen (PEG insertion site)  Last BM:  2/16  Height:   Ht Readings from Last 1 Encounters:  11/01/19 5' 6"  (1.676 m)    Weight:   Wt Readings from Last 1 Encounters:  03/24/20 73.5 kg    BMI:  Body mass index is 26.15 kg/m.  Estimated Nutritional Needs:   Kcal:  2400-2600  Protein:  120-135 grams  Fluid:  >2L/d  [address] pager number and weekend/on-call pager number located in [email or web address]

## 2020-03-26 LAB — GLUCOSE, CAPILLARY
Glucose-Capillary: 89 mg/dL (ref 70–99)
Glucose-Capillary: 99 mg/dL (ref 70–99)
Glucose-Capillary: 93 mg/dL (ref 70–99)
Glucose-Capillary: 72 mg/dL (ref 70–99)
Glucose-Capillary: 172 mg/dL — ABNORMAL HIGH (ref 70–99)
Glucose-Capillary: 96 mg/dL (ref 70–99)

## 2020-03-26 NOTE — Plan of Care (Signed)
?  Problem: Clinical Measurements: ?Goal: Respiratory complications will improve ?Outcome: Progressing ?  ?Problem: Clinical Measurements: ?Goal: Cardiovascular complication will be avoided ?Outcome: Progressing ?  ?Problem: Nutrition: ?Goal: Adequate nutrition will be maintained ?Outcome: Progressing ?  ?Problem: Pain Managment: ?Goal: General experience of comfort will improve ?Outcome: Progressing ?  ?

## 2020-03-26 NOTE — Progress Notes (Signed)
[facility] PROGRESS NOTE  Roe [place] VZC:[id number] DOB: 09/13/1965 DOA: 11/01/2019 PCP: Default, Provider, MD       12/19/19                          03/15/20 OOB to chair   Status: Remains inpatient appropriate because:Altered mental status, Unsafe d/c plan and Inpatient level of care appropriate due to severity of illness. Patient newly diagnosed with anoxic brain injury  Dispo: The patient is from: Home              Anticipated d/c is to: SNF              Anticipated d/c date is: > 3 days              Patient currently is medically stable to d/c.  Barriers to discharge: No SNF bed offers.  Needs trach capable facility.  Still awaiting approval of Medicaid and disability.  No bed offers. Facility to review today 2/18   Code Status: DNR after d/w palliative team on 1/27 Family Communication: 2/21 updated dtr [name] -she flew to [facility] 2/21 DVT prophylaxis: Subcutaneous heparin Vaccination status: Will order first dose Pfizer COVID-vaccine on 1/10; next week we will order pneumococcal and influenza vaccinations-2nd dose ordered 277  Foley catheter:  No  HPI: 55 year old male with HIV, chronic alcohol abuse who was presented to the emergency department after being found unresponsive outside a convinient store.  EMS found him pulseless in PEA, unknown downtime, successfully resuscitated and brought to the emergency department.  UDS positive for THC and benzodiazepines.  CT head unremarkable.  Failed extubation trials due to severe anoxic brain injury and had tracheostomy placed.  PEG placed on 10/15.  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  This has led to significant pain as well.  [name] was consulted and recommendations/input/orders highly appreciated.  She may also benefit from Botox injections to both hips to aid in treatment of spasticity.   Significant Hospital Events   9/29  Admit post PEA arrest. UDS positive for THC, benzo's. ETOH 177 10/05 EEG ongoing. Versed restarted overnight due to agitation. Nicardipine increased.  10/06 Developed tachypnea with WUA, no follow commands off sedation  10/07 Vomiting, TF held / restarted with recurrent vomiting 10/16 tracheostomy collar for 9 hours 10/18->10/19 off vent all night  10/20> TC 11/15 > downsized to #4 Shiley flex CFS  Subjective: Patient awake and alert.  Smiling appropriately.  Motioning to be turned  Objective: Vitals:   03/26/20 0403 03/26/20 0501  BP:  113/83  Pulse: 78 73  Resp: 18 18  Temp:  98.4 F (36.9 C)  SpO2: 94% 95%    Intake/Output Summary (Last 24 hours) at 03/26/2020 0805 Last data filed at 03/26/2020 0423 Gross per 24 hour  Intake 937 ml  Output 2300 ml  Net -1363 ml   Filed Weights   03/15/20 0742 03/24/20 0500 03/25/20 0500  Weight: 74 kg 73.5 kg 73.5 kg    Exam: General: Alert and in no acute distress Pulmonary: Bilateral lung sounds are clear to auscultation.  FiO2 21% via humidified trach . Cardiac: Normal heart sounds, pulse regular nontachycardic, extremities warm to touch Abdomen: Soft and nontender to palpation.  Bowel sounds present.  PEG tube for tube feedings LBM 2/21 Neurological: Severe hypertonicity/spasticity bilateral lower extremities involving the hip flexors and hamstrings;  keeps bilateral hips flexed -continues to have  pain w/ attempts to extend either leg especially the right leg.  Nonpurposeful use of upper extremities.   Psychiatric: Awake and alert.  No meaningful verbal communication so unable to accurately assess orientation Skin: Wound on dorsum of left foot covered with dressing.  No periwound erythema.  Assessment/Plan: Acute problems: Anoxic brain injury 2/2 PEA arrest/pain syndrome secondary to hypertonicity/spastic tetraplegia:  -Presumed 2/2 combination alcohol and BZD overdose with UDS positive for THC and BZDs -Significant brain  injury/spastic tetraplegia -needs long-term SNF -Continue baclofen, gabapentin, scheduled oxycodone and Zanaflex for spastic tetraplegia -1/6 [name] with rehabilitative medicine performed Botox injection into the quadriceps and rectus femoris as well as the biceps femoris and hamstrings, semimembranosus and semitendinosus.  Unable to inject iliopsoas without EMG guidance w/o significant improvement and spasticity hypertonicity. -Continue bilateral UE WHO resting splints/PROM q 4 hours/KREG bed -Continue Seroquel  -Continue out of bed to chair with lift  Blister dorsum left foot  03/15/20  -Possibly pressure related -Appreciate wound care nurse assistance.  Superficial skin from blister debrided and Xeroform gauze applied to open wound and was covered with a foam dressing with recommendations to change the Xerofoam daily and the foam dressing every 3 days or as needed for soiling  Goals of care:  -Palliative care team reconsulted on 1/27 and after an extensive discussion daughter does realize that patient quite debilitated and would not survive a resuscitation therefore patient made a DNR  Chronic respiratory failure with inability to maintain patent airway requiring chronic tracheostomy tube/Recent Corynebacterium tracheobronchitis (resolved) -Continue #4 cuffless trach in place-trach changed out on 1/10 RT -Given altered mentation and inability to manage secretions PCCM states patient is not a candidate for elective decannulation  Fever/peri-PEG drainage positive for corynebacterium and rare Pseudomonas -PEG site with some drainage but no erythema, induration or pain -Suspect may have colonization noting recently had corynebacterium tracheobronchitis treated -continue bacitracin ointment  HIV:  -CD4 count of 124 October 2021 (had been as high as 250 Dec 2020)-as of today up to 259; viral load obtained on 1/15 was <20 -Dc'd Tivicay and Descovy infavor of Biktarvy to for eventual dc to  SNF; per my discussion with infectious disease physician [name] on 12/23 preferred regimen would be Descovy and Tivicay-she will contact the HIV pharmacist to attempt to obtain discount cards to make this a more affordable option.  Once this has been confirmed will transition back to Descovy and Tivicay -CD4 count on 12/23 up to 259 -ID recommends continuing prophylactic Bactrim an additional 3 months (last dose 04/24/2020)  Dysphagia 2/2 anoxic brain injury -Continue tube feeding per PEG -Given altered mentation patient not a candidate for further speech evaluations and will be permanently dependent on enteral tube feedings  Constipation -We will continue scheduled Colace, fiber supplements per tube as well as daily MiraLAX -Suspect requirement for narcotics and multiple muscle relaxers contributing to decreased GI motility and constipation symptoms  Protein calorie malnutrition nutrition Status: Nutrition Problem: Increased nutrient needs Etiology: acute illness Signs/Symptoms: estimated needs Interventions: Tube feeding bolus doses of Osmolite, Juven twice daily, Prosource TF 45 mL 3 times daily Estimated body mass index is 26.15 kg/m as calculated from the following:   Height as of this encounter: 5' 6"  (1.676 m).   Weight as of this encounter: 73.5 kg.   Multiple decubiti not POA Wound / Incision (Open or Dehisced) 11/17/19 Puncture Abdomen Left;Anterior;Upper G-tube insertion site  (Active)  Date First Assessed/Time First Assessed: 11/17/19 1646   Wound Type: Puncture  Location: Abdomen  Location Orientation: Left;Anterior;Upper  Wound Description (Comments): G-tube insertion site   Present on Admission: No    Assessments 11/17/2019  4:47 PM 03/20/2020 10:00 AM  Dressing Type Gauze (Comment);Tape dressing --  Dressing Changed New --  Dressing Status Clean;Dry;Intact --  Dressing Change Frequency PRN --  Site / Wound Assessment Clean;Dry;Pink --  Peri-wound Assessment Intact --   Wound Length (cm) -- 0 cm  Wound Width (cm) -- 0 cm  Wound Depth (cm) -- 0 cm  Wound Volume (cm^3) -- 0 cm^3  Wound Surface Area (cm^2) -- 0 cm^2  Closure None --  Drainage Amount None --  Treatment Cleansed --     No Linked orders to display     Wound / Incision (Open or Dehisced) 03/16/20 Other (Comment) Foot Anterior;Left (Active)  Date First Assessed/Time First Assessed: 03/16/20 0800   Wound Type: (c) Other (Comment)  Location: Foot  Location Orientation: Anterior;Left  Present on Admission: No    Assessments 03/16/2020  8:00 AM 03/22/2020  7:58 PM  Dressing Type Other (Comment) Foam - Lift dressing to assess site every shift  Dressing Changed Changed Changed  Dressing Status Old drainage --  Dressing Change Frequency Daily --  Site / Wound Assessment Pink;Red --  Peri-wound Assessment Intact --  Drainage Amount Minimal --  Drainage Description Serosanguineous --  Treatment Cleansed --     No Linked orders to display      Other problems: Hypertension/grade 1 diastolic dysfunction:  -Continue amlodipine  -Echocardiogram this admission with preserved LVEF with evidence of grade   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   -Continue Keppra; levetiracetam level 35.9 on 11/12 -no further seizure activity since transitioned out of ICU setting therefore will discontinue seizure precaution  SIRS/Fever 2/2 recurrent enterococcus UTI -Patient developed mild elevation in temperature overnight to 1/13 associated with slight increase in respiratory secretions and congested sounding cough/abnormal pulmonary exam as well as tachycardia./14 respiratory status much improved and no further fevers. -Respiratory viral panel neg for COVID, influenza and RSV.   -UA slightly abnormal/cx c/w enterococcus UTI- started on nitrofurantoin (prior UTI treated w/ augmentin) has completed 3 days of treatment -X-ray with bilateral opacities concerning for early pneumonia but CT of the  chest without pneumonia, broad-spectrum antibiotics dc'd 1/14 -Because of dehydration increased frequency of free water from 200 cc every 4 hours to every 2 hours on 1/13- stopped on 1/15  Low-grade fever/abnormal urinalysis -2/8: Solitary T-max 100.5 overnight.  Likely related to atelectasis.  Obtain chest x-ray today that was unremarkable -As a precaution we will go ahead and obtain urinalysis and culture and blood cultures; electrolytes unremarkable, LDH normal at 178, no leukocytosis -Urinalysis abnormal with hazy appearance, trace leukocytes, rare bacteria and 11-20 WBCs-urine culture was never obtained and subsequently fever has resolved -Blood cultures are negative -Chest x-ray obtained on 2/14 unremarkable  Inguinal hernias -Evaluated by general surgery this admission. Documented as chronically incarcerated. Patient has been clinically stable and tolerating tube feedings and having bowel movements so unless patient obstructed would not be considered a candidate for repair. Even if obstructed consulting surgeon was not sure he would be a candidate for hernia repair regardless  Constipation -No documented bowel movement since 1/13 -We will continue scheduled Colace, fiber supplements per tube -Add daily MiraLAX -Given one-time dose milk of magnesia on 1/20-no documented bowel movement on flowsheet -Suspect requirement for narcotics and multiple muscle relaxers contributing to decreased GI motility and constipation symptoms  Goals  of care:  -Palliative care was involved during this hospitalization.   -Goals of care were discussed. remains full code.  -Palliative care recommended outpatient follow-up with palliative providers. -Reconsult Palliative medicine 1/27   Data Reviewed: Basic Metabolic Panel: Recent Labs  Lab 03/25/20 0830  NA 141  K 3.9  CL 104  CO2 25  GLUCOSE 101*  BUN 18  CREATININE 0.64  CALCIUM 10.5*   Liver Function Tests: Recent Labs  Lab 03/25/20 0830   AST 25  ALT 19  ALKPHOS 88  BILITOT 0.4  PROT 8.0  ALBUMIN 3.6   No results for input(s): LIPASE, AMYLASE in the last 168 hours. No results for input(s): AMMONIA in the last 168 hours. CBC: Recent Labs  Lab 03/25/20 0830  WBC 3.7*  HGB 14.3  HCT 44.8  MCV 95.1  PLT 153   Cardiac Enzymes: No results for input(s): CKTOTAL, CKMB, CKMBINDEX, TROPONINI in the last 168 hours. BNP (last 3 results) Recent Labs    02/15/20 0433  BNP 3.9    ProBNP (last 3 results) No results for input(s): PROBNP in the last 8760 hours.  CBG: Recent Labs  Lab 03/25/20 0752 03/25/20 1156 03/25/20 1556 03/25/20 1922 03/25/20 2308  GLUCAP 110* 113* 80 168* 109*    No results found for this or any previous visit (from the past 240 hour(s)).   Studies: No results found.  Scheduled Meds: . acetaminophen (TYLENOL) oral liquid 160 mg/5 mL  650 mg Per Tube Q6H  . amLODipine  10 mg Per Tube Daily  . bacitracin   Topical BID  . baclofen  30 mg Per Tube TID  . chlorhexidine gluconate (MEDLINE KIT)  15 mL Mouth Rinse BID  . Darunavir-Cobicisctat-Emtricitabine-Tenofovir Alafenamide  1 tablet Oral Q breakfast  . famotidine  10.4 mg Per Tube BID  . feeding supplement (OSMOLITE 1.5 CAL)  237 mL Per Tube Q24H  . feeding supplement (OSMOLITE 1.5 CAL)  474 mL Per Tube TID  . feeding supplement (PROSource TF)  45 mL Per Tube TID  . fiber  1 packet Per Tube BID  . folic acid  1 mg Per Tube Daily  . free water  200 mL Per Tube Q4H  . gabapentin  200 mg Per Tube Q8H  . heparin injection (subcutaneous)  5,000 Units Subcutaneous Q8H  . levETIRAcetam  1,500 mg Per Tube BID  . magic mouthwash w/lidocaine  5 mL Oral QID  . Muscle Rub   Topical QID  . oxyCODONE  5 mg Per Tube Q6H  . polyethylene glycol  17 g Per Tube QHS  . QUEtiapine  12.5 mg Per Tube QHS  . scopolamine  1 patch Transdermal Q72H  . sulfamethoxazole-trimethoprim  1 tablet Per Tube Once per day on Mon Wed Fri  . thiamine  100 mg Per  Tube Daily  . tiZANidine  6 mg Per Tube Q6H   Continuous Infusions:   Active Problems:   Cardiac arrest [facility] 2)   Community acquired pneumonia of left upper lobe of lung   Anoxic brain injury ([place])   Alcohol abuse   Acute respiratory failure with hypoxia (HCC)   Vomiting   Pressure injury of skin   Small bowel obstruction (HCC)   Palliative care encounter   Bowel obstruction (HCC)   Chronic respiratory failure (HCC)   Diastolic dysfunction   Tracheostomy dependent (HCC)   Dysphagia   Protein-calorie malnutrition (HCC)   Seizures ([place])   Bilateral recurrent inguinal hernia   Muscle spasticity  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HCC)   Tracheobronchitis   Sequela of Corynebacterium infection   Abnormal urine   Urinary tract infection due to Enterococcus   Fever   Consultants:  PCCM  Palliative medicine  General surgery  Neurology  Interventional radiology  Procedures:  EEG  Echocardiogram  Tracheostomy  Antibiotics: Anti-infectives (From admission, onward)   Start     Dose/Rate Route Frequency Ordered Stop   02/17/20 1100  nitrofurantoin (FURADANTIN) 25 MG/5ML suspension 100 mg        100 mg Per Tube Every 12 hours 02/17/20 1006 02/20/20 0924   02/15/20 1000  vancomycin (VANCOCIN) IVPB 1000 mg/200 mL premix  Status:  Discontinued        1,000 mg 200 mL/hr over 60 Minutes Intravenous Every 12 hours 02/14/20 2226 02/16/20 0742   02/14/20 2245  piperacillin-tazobactam (ZOSYN) IVPB 3.375 g  Status:  Discontinued        3.375 g 12.5 mL/hr over 240 Minutes Intravenous Every 8 hours 02/14/20 2226 02/16/20 0742   02/14/20 2245  vancomycin (VANCOREADY) IVPB 1500 mg/300 mL        1,500 mg 150 mL/hr over 120 Minutes Intravenous  Once 02/14/20 2226 02/15/20 0739   02/01/20 2200  amoxicillin-clavulanate (AUGMENTIN) 250-62.5 MG/5ML suspension 500 mg        500 mg Per Tube Every 8 hours 02/01/20 1343 02/06/20 1333   01/31/20 1015  levofloxacin (LEVAQUIN) 25  MG/ML solution 500 mg  Status:  Discontinued        500 mg Per Tube Daily 01/31/20 0919 02/01/20 1343   01/31/20 1000  cefTRIAXone (ROCEPHIN) 1 g in sodium chloride 0.9 % 100 mL IVPB  Status:  Discontinued        1 g 200 mL/hr over 30 Minutes Intravenous Every 24 hours 01/31/20 0905 01/31/20 0919   01/19/20 2200  linezolid (ZYVOX) tablet 600 mg        600 mg Per Tube Every 12 hours 01/19/20 1534 01/25/20 2019   01/19/20 1100  levofloxacin (LEVAQUIN) tablet 500 mg  Status:  Discontinued        500 mg Per Tube Daily 01/19/20 1004 01/19/20 1534   12/19/19 0930  Darunavir-Cobicisctat-Emtricitabine-Tenofovir Alafenamide (SYMTUZA) 800-150-200-10 MG TABS 1 tablet        1 tablet Oral Daily with breakfast 12/19/19 0840     12/13/19 1000  bictegravir-emtricitabine-tenofovir AF (BIKTARVY) 50-200-25 MG per tablet 1 tablet  Status:  Discontinued        1 tablet Oral Daily 12/12/19 1413 12/19/19 0840   11/17/19 1548  ceFAZolin (ANCEF) 2-4 GM/100ML-% IVPB       Note to Pharmacy: Domenick Bookbinder   : cabinet override      11/17/19 1548 11/18/19 0359   11/16/19 1515  ceFAZolin (ANCEF) IVPB 2g/100 mL premix        2 g 200 mL/hr over 30 Minutes Intravenous To Radiology 11/16/19 1511 11/17/19 1625   11/15/19 0900  sulfamethoxazole-trimethoprim (BACTRIM DS) 800-160 MG per tablet 1 tablet        1 tablet Per Tube Once per day on Mon Wed Fri 11/13/19 1906     11/13/19 1615  dolutegravir (TIVICAY) tablet 50 mg  Status:  Discontinued        50 mg Oral Daily 11/13/19 1521 12/12/19 1413   11/13/19 1615  emtricitabine-tenofovir AF (DESCOVY) 200-25 MG per tablet 1 tablet  Status:  Discontinued        1 tablet Per Tube Daily 11/13/19 1521 12/12/19  1413   11/13/19 1530  sulfamethoxazole-trimethoprim (BACTRIM DS) 800-160 MG per tablet 1 tablet  Status:  Discontinued        1 tablet Oral Once per day on Mon Wed Fri 11/13/19 1441 11/13/19 1906   11/13/19 1500  bictegravir-emtricitabine-tenofovir AF (BIKTARVY) 50-200-25 MG  per tablet 1 tablet  Status:  Discontinued        1 tablet Oral Daily 11/13/19 1403 11/13/19 1521   11/10/19 1000  erythromycin 250 mg in sodium chloride 0.9 % 100 mL IVPB        250 mg 100 mL/hr over 60 Minutes Intravenous Every 8 hours 11/10/19 0907 11/11/19 2000   11/07/19 1200  ceFEPIme (MAXIPIME) 2 g in sodium chloride 0.9 % 100 mL IVPB        2 g 200 mL/hr over 30 Minutes Intravenous Every 8 hours 11/07/19 1013 11/13/19 2127   11/03/19 1200  cefTRIAXone (ROCEPHIN) 2 g in sodium chloride 0.9 % 100 mL IVPB        2 g 200 mL/hr over 30 Minutes Intravenous Every 24 hours 11/03/19 0922 11/05/19 1427   11/02/19 0800  vancomycin (VANCOREADY) IVPB 750 mg/150 mL  Status:  Discontinued        750 mg 150 mL/hr over 60 Minutes Intravenous Every 12 hours 11/01/19 1935 11/02/19 1105   11/02/19 0600  piperacillin-tazobactam (ZOSYN) IVPB 3.375 g  Status:  Discontinued        3.375 g 12.5 mL/hr over 240 Minutes Intravenous Every 8 hours 11/02/19 0311 11/03/19 0920   11/01/19 2200  ceFEPIme (MAXIPIME) 2 g in sodium chloride 0.9 % 100 mL IVPB  Status:  Discontinued        2 g 200 mL/hr over 30 Minutes Intravenous Every 12 hours 11/01/19 2143 11/02/19 0311   11/01/19 1915  vancomycin (VANCOREADY) IVPB 1500 mg/300 mL        1,500 mg 150 mL/hr over 120 Minutes Intravenous  Once 11/01/19 1909 11/01/19 2147   11/01/19 1845  cefTRIAXone (ROCEPHIN) 1 g in sodium chloride 0.9 % 100 mL IVPB        1 g 200 mL/hr over 30 Minutes Intravenous  Once 11/01/19 1842 11/01/19 2051   11/01/19 1845  azithromycin (ZITHROMAX) 500 mg in sodium chloride 0.9 % 250 mL IVPB        500 mg 250 mL/hr over 60 Minutes Intravenous  Once 11/01/19 1842 11/01/19 2051       Time spent: 20 minutes    Erin Hearing ANP  [facility]  7 am-330 pm/M-F for direct patient care and secure chat Please refer to Amion for contact information 03/26/2020, 8:05 AM  LOS: 146 days

## 2020-03-27 LAB — GLUCOSE, CAPILLARY
Glucose-Capillary: 84 mg/dL (ref 70–99)
Glucose-Capillary: 87 mg/dL (ref 70–99)
Glucose-Capillary: 90 mg/dL (ref 70–99)
Glucose-Capillary: 90 mg/dL (ref 70–99)
Glucose-Capillary: 147 mg/dL — ABNORMAL HIGH (ref 70–99)
Glucose-Capillary: 139 mg/dL — ABNORMAL HIGH (ref 70–99)

## 2020-03-27 NOTE — Progress Notes (Signed)
[facility] PROGRESS NOTE  [place] EVO:[id number] DOB: 04/28/1965 DOA: 11/01/2019 PCP: Default, Provider, MD       12/19/19                          03/15/20 OOB to chair   Status: Remains inpatient appropriate because:Altered mental status, Unsafe d/c plan and Inpatient level of care appropriate due to severity of illness. Patient newly diagnosed with anoxic brain injury  Dispo: The patient is from: Home              Anticipated d/c is to: SNF              Anticipated d/c date is: > 3 days              Patient currently is medically stable to d/c.  Barriers to discharge: No SNF bed offers.  Needs trach capable facility.  Still awaiting approval of Medicaid and disability.  No bed offers. Facility to review today 2/18   Code Status: DNR after d/w palliative team on 1/27 Family Communication: 2/21 updated dtr [name] -she flew to [facility] 2/21 DVT prophylaxis: Subcutaneous heparin Vaccination status: Will order first dose Pfizer COVID-vaccine on 1/10; next week we will order pneumococcal and influenza vaccinations-2nd dose ordered 277  Foley catheter:  No  HPI: 55 year old male with HIV, chronic alcohol abuse who was presented to the emergency department after being found unresponsive outside a convinient store.  EMS found him pulseless in PEA, unknown downtime, successfully resuscitated and brought to the emergency department.  UDS positive for THC and benzodiazepines.  CT head unremarkable.  Failed extubation trials due to severe anoxic brain injury and had tracheostomy placed.  PEG placed on 10/15.  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  This has led to significant pain as well.  [name] was consulted and recommendations/input/orders highly appreciated.  She may also benefit from Botox injections to both hips to aid in treatment of spasticity.   Significant Hospital Events   9/29  Admit post PEA arrest. UDS positive for THC, benzo's. ETOH 177 10/05 EEG ongoing. Versed restarted overnight due to agitation. Nicardipine increased.  10/06 Developed tachypnea with WUA, no follow commands off sedation  10/07 Vomiting, TF held / restarted with recurrent vomiting 10/16 tracheostomy collar for 9 hours 10/18->10/19 off vent all night  10/20> TC 11/15 > downsized to #4 Shiley flex CFS  Subjective: Awakened.  Smiled.  Asked him to shake his head yes or no if he wanted his head up and demonstrated this activity for him.  He was able to put his head down to his chin and then smiled.  Head of bed elevated.  Objective: Vitals:   03/27/20 0400 03/27/20 0501  BP:  121/77  Pulse: 83 74  Resp: 20 18  Temp:  (!) 97.2 F (36.2 C)  SpO2: 96% 97%    Intake/Output Summary (Last 24 hours) at 03/27/2020 0757 Last data filed at 03/27/2020 0357 Gross per 24 hour  Intake -  Output 1870 ml  Net -1870 ml   Filed Weights   03/15/20 0742 03/24/20 0500 03/25/20 0500  Weight: 74 kg 73.5 kg 73.5 kg    Exam: General: Awakened and remained alert and interactive, calm in no acute distress Pulmonary: Anterior lung sounds are clear to auscultation FiO2 21% via humidified trach collar. Cardiac: Heart sounds S1-S2, pulses regular and nontachycardic.  Extremities are warm  with adequate capillary refill Abdomen: Abdomen is soft nondistended and nontender with normoactive bowel sounds, PEG tube for feedings and medications.  LBM 2/21 Neurological: Severe hypertonicity/spasticity bilateral lower extremities involving the hip flexors and hamstrings;  Keeps bilateral hips flexed secondary to hip flexor contractures essentially nonpurposeful use of upper extremities at times it does appear as if he is motioning as directionally in regards to repositioning.   Psychiatric: Awake.  Interacts appropriately and makes purposeful eye contact.  Although he has monosyllabic responses he does at times make  attempts to follow some simple commands as he did today by having the appearance of nodding his head in a yes fashion. Skin: Wound on dorsum of left foot covered with dressing.  No periwound erythema.  Assessment/Plan: Acute problems: Anoxic brain injury 2/2 PEA arrest/pain syndrome secondary to hypertonicity/spastic tetraplegia:  -Presumed 2/2 combination alcohol and BZD overdose with UDS positive for THC and BZDs -Significant brain injury/spastic tetraplegia -needs long-term SNF -Continue baclofen, gabapentin, scheduled oxycodone and Zanaflex for spastic tetraplegia -1/6 [name] with rehabilitative medicine performed Botox injection into the quadriceps and rectus femoris as well as the biceps femoris and hamstrings, semimembranosus and semitendinosus.  Unable to inject iliopsoas without EMG guidance w/o significant improvement and spasticity hypertonicity.  It appears now he has bilateral hip flexor contractures -Continue bilateral UE WHO resting splints/PROM q 4 hours/KREG bed -Continue Seroquel  -Continue out of bed to chair with lift  Blister dorsum left foot  03/15/20 -Appreciate wound care nurse assistance.  Superficial skin from blister debrided and Xeroform gauze applied to open wound and was covered with a foam dressing with recommendations to change the Xerofoam daily and the foam dressing every 3 days or as needed for soiling  Goals of care:  -Palliative care team reconsulted on 1/27 and after an extensive discussion daughter does realize that patient quite debilitated and would not survive a resuscitation therefore patient made a DNR  Chronic respiratory failure with inability to maintain patent airway requiring chronic tracheostomy tube/Recent Corynebacterium tracheobronchitis (resolved) -Continue #4 cuffless trach in place-trach changed out on 1/10 RT -Given altered mentation and inability to manage secretions PCCM states patient is not a candidate for elective  decannulation  Fever/peri-PEG drainage positive for corynebacterium and rare Pseudomonas -PEG site with some drainage but no erythema, induration or pain -Suspect may have colonization noting recently had corynebacterium tracheobronchitis treated -continue bacitracin ointment  HIV:  -CD4 count of 124 October 2021 (had been as high as 250 Dec 2020)-as of today up to 259; viral load obtained on 1/15 was <20 -Dc'd Tivicay and Descovy infavor of Biktarvy to for eventual dc to SNF; per my discussion with infectious disease physician [name] on 12/23 preferred regimen would be Descovy and Tivicay-she will contact the HIV pharmacist to attempt to obtain discount cards to make this a more affordable option.  Once this has been confirmed will transition back to Descovy and Tivicay -CD4 count on 12/23 up to 259 -ID recommends continuing prophylactic Bactrim an additional 3 months (last dose 04/24/2020)  Dysphagia 2/2 anoxic brain injury -Continue tube feeding per PEG -Given altered mentation patient not a candidate for further speech evaluations and will be permanently dependent on enteral tube feedings  Constipation -We will continue scheduled Colace, fiber supplements per tube as well as daily MiraLAX -Suspect requirement for narcotics and multiple muscle relaxers contributing to decreased GI motility and constipation symptoms  Protein calorie malnutrition nutrition Status: Nutrition Problem: Increased nutrient needs Etiology: acute illness Signs/Symptoms: estimated  needs Interventions: Tube feeding bolus doses of Osmolite, Juven twice daily, Prosource TF 45 mL 3 times daily Estimated body mass index is 26.15 kg/m as calculated from the following:   Height as of this encounter: 5' 6"  (1.676 m).   Weight as of this encounter: 73.5 kg.   Multiple decubiti not POA Wound / Incision (Open or Dehisced) 11/17/19 Puncture Abdomen Left;Anterior;Upper G-tube insertion site  (Active)  Date First  Assessed/Time First Assessed: 11/17/19 1646   Wound Type: Puncture  Location: Abdomen  Location Orientation: Left;Anterior;Upper  Wound Description (Comments): G-tube insertion site   Present on Admission: No    Assessments 11/17/2019  4:47 PM 03/26/2020  8:58 PM  Dressing Type Gauze (Comment);Tape dressing Gauze (Comment)  Dressing Changed New Changed  Dressing Status Clean;Dry;Intact Old drainage  Dressing Change Frequency PRN Daily  Site / Wound Assessment Clean;Dry;Pink Clean;Dry;Red  Peri-wound Assessment Intact Intact  Closure None -  Drainage Amount None Scant  Drainage Description - Purulent  Treatment Cleansed Cleansed     No Linked orders to display     Wound / Incision (Open or Dehisced) 03/16/20 Other (Comment) Foot Anterior;Left (Active)  Date First Assessed/Time First Assessed: 03/16/20 0800   Wound Type: (c) Other (Comment)  Location: Foot  Location Orientation: Anterior;Left  Present on Admission: No    Assessments 03/16/2020  8:00 AM 03/22/2020  7:58 PM  Dressing Type Other (Comment) Foam - Lift dressing to assess site every shift  Dressing Changed Changed Changed  Dressing Status Old drainage -  Dressing Change Frequency Daily -  Site / Wound Assessment Pink;Red -  Peri-wound Assessment Intact -  Drainage Amount Minimal -  Drainage Description Serosanguineous -  Treatment Cleansed -     No Linked orders to display      Other problems: Hypertension/grade 1 diastolic dysfunction:  -Continue amlodipine  -Echocardiogram this admission with preserved LVEF with evidence of grade   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   -Continue Keppra; levetiracetam level 35.9 on 11/12 -no further seizure activity since transitioned out of ICU setting therefore will discontinue seizure precaution  SIRS/Fever 2/2 recurrent enterococcus UTI -Patient developed mild elevation in temperature overnight to 1/13 associated with slight increase in respiratory  secretions and congested sounding cough/abnormal pulmonary exam as well as tachycardia./14 respiratory status much improved and no further fevers. -Respiratory viral panel neg for COVID, influenza and RSV.   -UA slightly abnormal/cx c/w enterococcus UTI- started on nitrofurantoin (prior UTI treated w/ augmentin) has completed 3 days of treatment -X-ray with bilateral opacities concerning for early pneumonia but CT of the chest without pneumonia, broad-spectrum antibiotics dc'd 1/14 -Because of dehydration increased frequency of free water from 200 cc every 4 hours to every 2 hours on 1/13- stopped on 1/15  Low-grade fever/abnormal urinalysis -2/8: Solitary T-max 100.5 overnight.  Likely related to atelectasis.  Obtain chest x-ray today that was unremarkable -As a precaution we will go ahead and obtain urinalysis and culture and blood cultures; electrolytes unremarkable, LDH normal at 178, no leukocytosis -Urinalysis abnormal with hazy appearance, trace leukocytes, rare bacteria and 11-20 WBCs-urine culture was never obtained and subsequently fever has resolved -Blood cultures are negative -Chest x-ray obtained on 2/14 unremarkable  Inguinal hernias -Evaluated by general surgery this admission. Documented as chronically incarcerated. Patient has been clinically stable and tolerating tube feedings and having bowel movements so unless patient obstructed would not be considered a candidate for repair. Even if obstructed consulting surgeon was not sure he would be  a candidate for hernia repair regardless  Constipation -No documented bowel movement since 1/13 -We will continue scheduled Colace, fiber supplements per tube -Add daily MiraLAX -Given one-time dose milk of magnesia on 1/20-no documented bowel movement on flowsheet -Suspect requirement for narcotics and multiple muscle relaxers contributing to decreased GI motility and constipation symptoms  Goals of care:  -Palliative care was involved  during this hospitalization.   -Goals of care were discussed. remains full code.  -Palliative care recommended outpatient follow-up with palliative providers. -Reconsult Palliative medicine 1/27   Data Reviewed: Basic Metabolic Panel: Recent Labs  Lab 03/25/20 0830  NA 141  K 3.9  CL 104  CO2 25  GLUCOSE 101*  BUN 18  CREATININE 0.64  CALCIUM 10.5*   Liver Function Tests: Recent Labs  Lab 03/25/20 0830  AST 25  ALT 19  ALKPHOS 88  BILITOT 0.4  PROT 8.0  ALBUMIN 3.6   No results for input(s): LIPASE, AMYLASE in the last 168 hours. No results for input(s): AMMONIA in the last 168 hours. CBC: Recent Labs  Lab 03/25/20 0830  WBC 3.7*  HGB 14.3  HCT 44.8  MCV 95.1  PLT 153   Cardiac Enzymes: No results for input(s): CKTOTAL, CKMB, CKMBINDEX, TROPONINI in the last 168 hours. BNP (last 3 results) Recent Labs    02/15/20 0433  BNP 3.9    ProBNP (last 3 results) No results for input(s): PROBNP in the last 8760 hours.  CBG: Recent Labs  Lab 03/26/20 1145 03/26/20 1647 03/26/20 2011 03/26/20 2316 03/27/20 0754  GLUCAP 93 72 172* 96 87    No results found for this or any previous visit (from the past 240 hour(s)).   Studies: No results found.  Scheduled Meds: . acetaminophen (TYLENOL) oral liquid 160 mg/5 mL  650 mg Per Tube Q6H  . amLODipine  10 mg Per Tube Daily  . bacitracin   Topical BID  . baclofen  30 mg Per Tube TID  . chlorhexidine gluconate (MEDLINE KIT)  15 mL Mouth Rinse BID  . Darunavir-Cobicisctat-Emtricitabine-Tenofovir Alafenamide  1 tablet Oral Q breakfast  . famotidine  10.4 mg Per Tube BID  . feeding supplement (OSMOLITE 1.5 CAL)  237 mL Per Tube Q24H  . feeding supplement (OSMOLITE 1.5 CAL)  474 mL Per Tube TID  . feeding supplement (PROSource TF)  45 mL Per Tube TID  . fiber  1 packet Per Tube BID  . folic acid  1 mg Per Tube Daily  . free water  200 mL Per Tube Q4H  . gabapentin  200 mg Per Tube Q8H  . heparin injection  (subcutaneous)  5,000 Units Subcutaneous Q8H  . levETIRAcetam  1,500 mg Per Tube BID  . magic mouthwash w/lidocaine  5 mL Oral QID  . Muscle Rub   Topical QID  . oxyCODONE  5 mg Per Tube Q6H  . polyethylene glycol  17 g Per Tube QHS  . QUEtiapine  12.5 mg Per Tube QHS  . scopolamine  1 patch Transdermal Q72H  . sulfamethoxazole-trimethoprim  1 tablet Per Tube Once per day on Mon Wed Fri  . thiamine  100 mg Per Tube Daily  . tiZANidine  6 mg Per Tube Q6H   Continuous Infusions:   Active Problems:   Cardiac arrest [facility])   Community acquired pneumonia of left upper lobe of lung   Anoxic brain injury ([place])   Alcohol abuse   Acute respiratory failure with hypoxia (H[facility])   Vomiting   Pressure  injury of skin   Small bowel obstruction ([place])   Palliative care encounter   Bowel obstruction (HCC)   Chronic respiratory failure (HCC)   Diastolic dysfunction   Tracheostomy dependent ([place])   Dysphagia   Protein-calorie malnutrition (Home)   Seizures ([place])   Bilateral recurrent inguinal hernia   Muscle spasticity   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HCC)   Tracheobronchitis   Sequela of Corynebacterium infection   Abnormal urine   Urinary tract infection due to Enterococcus   Fever   Consultants:  PCCM  Palliative medicine  General surgery  Neurology  Interventional radiology  Procedures:  EEG  Echocardiogram  Tracheostomy  Antibiotics: Anti-infectives (From admission, onward)   Start     Dose/Rate Route Frequency Ordered Stop   02/17/20 1100  nitrofurantoin (FURADANTIN) 25 MG/5ML suspension 100 mg        100 mg Per Tube Every 12 hours 02/17/20 1006 02/20/20 0924   02/15/20 1000  vancomycin (VANCOCIN) IVPB 1000 mg/200 mL premix  Status:  Discontinued        1,000 mg 200 mL/hr over 60 Minutes Intravenous Every 12 hours 02/14/20 2226 02/16/20 0742   02/14/20 2245  piperacillin-tazobactam (ZOSYN) IVPB 3.375 g  Status:  Discontinued        3.375 g 12.5 mL/hr  over 240 Minutes Intravenous Every 8 hours 02/14/20 2226 02/16/20 0742   02/14/20 2245  vancomycin (VANCOREADY) IVPB 1500 mg/300 mL        1,500 mg 150 mL/hr over 120 Minutes Intravenous  Once 02/14/20 2226 02/15/20 0739   02/01/20 2200  amoxicillin-clavulanate (AUGMENTIN) 250-62.5 MG/5ML suspension 500 mg        500 mg Per Tube Every 8 hours 02/01/20 1343 02/06/20 1333   01/31/20 1015  levofloxacin (LEVAQUIN) 25 MG/ML solution 500 mg  Status:  Discontinued        500 mg Per Tube Daily 01/31/20 0919 02/01/20 1343   01/31/20 1000  cefTRIAXone (ROCEPHIN) 1 g in sodium chloride 0.9 % 100 mL IVPB  Status:  Discontinued        1 g 200 mL/hr over 30 Minutes Intravenous Every 24 hours 01/31/20 0905 01/31/20 0919   01/19/20 2200  linezolid (ZYVOX) tablet 600 mg        600 mg Per Tube Every 12 hours 01/19/20 1534 01/25/20 2019   01/19/20 1100  levofloxacin (LEVAQUIN) tablet 500 mg  Status:  Discontinued        500 mg Per Tube Daily 01/19/20 1004 01/19/20 1534   12/19/19 0930  Darunavir-Cobicisctat-Emtricitabine-Tenofovir Alafenamide (SYMTUZA) 800-150-200-10 MG TABS 1 tablet        1 tablet Oral Daily with breakfast 12/19/19 0840     12/13/19 1000  bictegravir-emtricitabine-tenofovir AF (BIKTARVY) 50-200-25 MG per tablet 1 tablet  Status:  Discontinued        1 tablet Oral Daily 12/12/19 1413 12/19/19 0840   11/17/19 1548  ceFAZolin (ANCEF) 2-4 GM/100ML-% IVPB       Note to Pharmacy: Domenick Bookbinder   : cabinet override      11/17/19 1548 11/18/19 0359   11/16/19 1515  ceFAZolin (ANCEF) IVPB 2g/100 mL premix        2 g 200 mL/hr over 30 Minutes Intravenous To Radiology 11/16/19 1511 11/17/19 1625   11/15/19 0900  sulfamethoxazole-trimethoprim (BACTRIM DS) 800-160 MG per tablet 1 tablet        1 tablet Per Tube Once per day on Mon Wed Fri 11/13/19 1906     11/13/19 1615  dolutegravir (TIVICAY) tablet 50 mg  Status:  Discontinued        50 mg Oral Daily 11/13/19 1521 12/12/19 1413   11/13/19 1615   emtricitabine-tenofovir AF (DESCOVY) 200-25 MG per tablet 1 tablet  Status:  Discontinued        1 tablet Per Tube Daily 11/13/19 1521 12/12/19 1413   11/13/19 1530  sulfamethoxazole-trimethoprim (BACTRIM DS) 800-160 MG per tablet 1 tablet  Status:  Discontinued        1 tablet Oral Once per day on Mon Wed Fri 11/13/19 1441 11/13/19 1906   11/13/19 1500  bictegravir-emtricitabine-tenofovir AF (BIKTARVY) 50-200-25 MG per tablet 1 tablet  Status:  Discontinued        1 tablet Oral Daily 11/13/19 1403 11/13/19 1521   11/10/19 1000  erythromycin 250 mg in sodium chloride 0.9 % 100 mL IVPB        250 mg 100 mL/hr over 60 Minutes Intravenous Every 8 hours 11/10/19 0907 11/11/19 2000   11/07/19 1200  ceFEPIme (MAXIPIME) 2 g in sodium chloride 0.9 % 100 mL IVPB        2 g 200 mL/hr over 30 Minutes Intravenous Every 8 hours 11/07/19 1013 11/13/19 2127   11/03/19 1200  cefTRIAXone (ROCEPHIN) 2 g in sodium chloride 0.9 % 100 mL IVPB        2 g 200 mL/hr over 30 Minutes Intravenous Every 24 hours 11/03/19 0922 11/05/19 1427   11/02/19 0800  vancomycin (VANCOREADY) IVPB 750 mg/150 mL  Status:  Discontinued        750 mg 150 mL/hr over 60 Minutes Intravenous Every 12 hours 11/01/19 1935 11/02/19 1105   11/02/19 0600  piperacillin-tazobactam (ZOSYN) IVPB 3.375 g  Status:  Discontinued        3.375 g 12.5 mL/hr over 240 Minutes Intravenous Every 8 hours 11/02/19 0311 11/03/19 0920   11/01/19 2200  ceFEPIme (MAXIPIME) 2 g in sodium chloride 0.9 % 100 mL IVPB  Status:  Discontinued        2 g 200 mL/hr over 30 Minutes Intravenous Every 12 hours 11/01/19 2143 11/02/19 0311   11/01/19 1915  vancomycin (VANCOREADY) IVPB 1500 mg/300 mL        1,500 mg 150 mL/hr over 120 Minutes Intravenous  Once 11/01/19 1909 11/01/19 2147   11/01/19 1845  cefTRIAXone (ROCEPHIN) 1 g in sodium chloride 0.9 % 100 mL IVPB        1 g 200 mL/hr over 30 Minutes Intravenous  Once 11/01/19 1842 11/01/19 2051   11/01/19 1845   azithromycin (ZITHROMAX) 500 mg in sodium chloride 0.9 % 250 mL IVPB        500 mg 250 mL/hr over 60 Minutes Intravenous  Once 11/01/19 1842 11/01/19 2051       Time spent: 20 minutes    Erin Hearing ANP  [facility]  7 am-330 pm/M-F for direct patient care and secure chat Please refer to Amion for contact information 03/27/2020, 7:57 AM  LOS: 147 days

## 2020-03-28 LAB — GLUCOSE, CAPILLARY
Glucose-Capillary: 90 mg/dL (ref 70–99)
Glucose-Capillary: 89 mg/dL (ref 70–99)
Glucose-Capillary: 119 mg/dL — ABNORMAL HIGH (ref 70–99)
Glucose-Capillary: 105 mg/dL — ABNORMAL HIGH (ref 70–99)
Glucose-Capillary: 138 mg/dL — ABNORMAL HIGH (ref 70–99)
Glucose-Capillary: 117 mg/dL — ABNORMAL HIGH (ref 70–99)

## 2020-03-28 NOTE — Plan of Care (Signed)
  Problem: Clinical Measurements: Goal: Respiratory complications will improve Outcome: Progressing   Problem: Clinical Measurements: Goal: Cardiovascular complication will be avoided Outcome: Progressing   Problem: Coping: Goal: Level of anxiety will decrease Outcome: Progressing   Problem: Elimination: Goal: Will not experience complications related to urinary retention Outcome: Progressing   

## 2020-03-28 NOTE — Plan of Care (Signed)
  Problem: Clinical Measurements: Goal: Respiratory complications will improve Outcome: Progressing   Problem: Clinical Measurements: Goal: Cardiovascular complication will be avoided Outcome: Progressing   Problem: Nutrition: Goal: Adequate nutrition will be maintained Outcome: Progressing   Problem: Safety: Goal: Ability to remain free from injury will improve Outcome: Progressing   

## 2020-03-28 NOTE — Progress Notes (Signed)
[facility] PROGRESS NOTE  Rishon [place] HKV:[id number] DOB: 30-Apr-1965 DOA: 11/01/2019 PCP: Default, Provider, MD       12/19/19                          03/15/20 OOB to chair   Status: Remains inpatient appropriate because:Altered mental status, Unsafe d/c plan and Inpatient level of care appropriate due to severity of illness. Patient newly diagnosed with anoxic brain injury  Dispo: The patient is from: Home              Anticipated d/c is to: SNF              Anticipated d/c date is: > 3 days              Patient currently is medically stable to d/c.  Barriers to discharge: No SNF bed offers.  Needs trach capable facility.  Still awaiting approval of Medicaid and disability.  No bed offers. Facility to review today 2/18   Code Status: DNR after d/w palliative team on 1/27 Family Communication: 2/21 updated dtr [name] -she flew to [facility] 2/21 DVT prophylaxis: Subcutaneous heparin Vaccination status: Will order first dose Pfizer COVID-vaccine on 1/10; next week we will order pneumococcal and influenza vaccinations-2nd dose ordered 277  Foley catheter:  No  HPI: 55 year old male with HIV, chronic alcohol abuse who was presented to the emergency department after being found unresponsive outside a convinient store.  EMS found him pulseless in PEA, unknown downtime, successfully resuscitated and brought to the emergency department.  UDS positive for THC and benzodiazepines.  CT head unremarkable.  Failed extubation trials due to severe anoxic brain injury and had tracheostomy placed.  PEG placed on 10/15.  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  This has led to significant pain as well.  [name] was consulted and recommendations/input/orders highly appreciated.  She may also benefit from Botox injections to both hips to aid in treatment of spasticity.   Significant Hospital Events   9/29  Admit post PEA arrest. UDS positive for THC, benzo's. ETOH 177 10/05 EEG ongoing. Versed restarted overnight due to agitation. Nicardipine increased.  10/06 Developed tachypnea with WUA, no follow commands off sedation  10/07 Vomiting, TF held / restarted with recurrent vomiting 10/16 tracheostomy collar for 9 hours 10/18->10/19 off vent all night  10/20> TC 11/15 > downsized to #4 Shiley flex CFS  Subjective: Awake.  No distress.  Noted patient without mittens on and had been able to dislodge his external condom catheter.  Also the tubing that is connected to the humidification unit on the wall had been dislodged from the actual trach collar.  Tubing replaced per myself.  Objective: Vitals:   03/28/20 0309 03/28/20 0512  BP:  127/78  Pulse: 67 75  Resp: 18 18  Temp:  97.6 F (36.4 C)  SpO2: 96% 95%    Intake/Output Summary (Last 24 hours) at 03/28/2020 0737 Last data filed at 03/28/2020 0353 Gross per 24 hour  Intake --  Output 2100 ml  Net -2100 ml   Filed Weights   03/15/20 0742 03/24/20 0500 03/25/20 0500  Weight: 74 kg 73.5 kg 73.5 kg    Exam: General: Alert, calm in no acute distress Pulmonary: Anterior lung sounds are clear.  FiO2 21% via humidified trach collar.  No secretions observed at trach site Cardiac: S1-S2, no tachycardia, extremities warm to touch Abdomen:  PEG tube for feedings and medications.  LBM 2/21 Neurological: Severe hypertonicity/spasticity bilateral lower extremities involving the hip flexors and hamstrings;  Keeps bilateral hips flexed secondary to hip flexor contractures essentially nonpurposeful use of upper extremities at times it does appear as if he is motioning as directionally in regards to repositioning.   Psychiatric: Awake.  Smiles.  No attempts to verbally communicate today. Skin: Wound on dorsum of left foot covered with dressing.  No periwound erythema.  Assessment/Plan: Acute problems: Anoxic brain injury 2/2 PEA arrest/pain  syndrome secondary to hypertonicity/spastic tetraplegia:  -Presumed 2/2 combination alcohol and BZD overdose (UDS + for THC and BZDs) -Significant brain injury/spastic tetraplegia with patient now completely bedbound -Continue baclofen, gabapentin, scheduled oxycodone and Zanaflex for spastic tetraplegia -1/6 [name] with rehabilitative medicine performed Botox injection into the quadriceps and rectus femoris as well as the biceps femoris and hamstrings, semimembranosus and semitendinosus.  Unable to inject iliopsoas without EMG guidance w/o significant improvement and spasticity hypertonicity.  Unfortunately given lack of improvement he now has bilateral hip flexor contractures -Continue bilateral UE WHO resting splints/PROM q 4 hours/KREG bed -Continue Seroquel  -Continue out of bed to chair with lift  Blister dorsum left foot  03/15/20 -Appreciate wound care nurse assistance.  Superficial skin from blister debrided and Xeroform gauze applied to open wound and was covered with a foam dressing with recommendations to change the Xerofoam daily and the foam dressing every 3 days or as needed for soiling  Goals of care:  -Palliative care team reconsulted on 1/27 and after an extensive discussion daughter does realize that patient quite debilitated and would not survive a resuscitation therefore patient made a DNR  Chronic respiratory failure with inability to maintain patent airway requiring chronic tracheostomy tube/Recent Corynebacterium tracheobronchitis (resolved) -Continue #4 cuffless trach in place-trach changed out on 1/10 RT -Given altered mentation and inability to manage secretions PCCM states patient is not a candidate for elective decannulation  Fever/peri-PEG drainage positive for corynebacterium and rare Pseudomonas -PEG site with some drainage but no erythema, induration or pain -Suspect may have colonization noting recently had corynebacterium tracheobronchitis  treated -continue bacitracin ointment  HIV:  -CD4 count of 124 October 2021 (had been as high as 250 Dec 2020)-as of today up to 259; viral load obtained on 1/15 was <20 -Dc'd Tivicay and Descovy infavor of Biktarvy to for eventual dc to SNF; per my discussion with infectious disease physician [name] on 12/23 preferred regimen would be Descovy and Tivicay-she will contact the HIV pharmacist to attempt to obtain discount cards to make this a more affordable option.  Once this has been confirmed will transition back to Descovy and Tivicay -CD4 count on 12/23 up to 259 -ID recommends continuing prophylactic Bactrim an additional 3 months (last dose 04/24/2020)  Dysphagia 2/2 anoxic brain injury -Continue tube feeding per PEG -Given altered mentation patient not a candidate for further speech evaluations and will be permanently dependent on enteral tube feedings  Constipation -We will continue scheduled Colace, fiber supplements per tube as well as daily MiraLAX -Suspect requirement for narcotics and multiple muscle relaxers contributing to decreased GI motility and constipation symptoms  Protein calorie malnutrition nutrition Status: Nutrition Problem: Increased nutrient needs Etiology: acute illness Signs/Symptoms: estimated needs Interventions: Tube feeding bolus doses of Osmolite, Juven twice daily, Prosource TF 45 mL 3 times daily Estimated body mass index is 26.15 kg/m as calculated from the following:   Height as of this encounter: 5' 6"  (1.676 m).  Weight as of this encounter: 73.5 kg.   Multiple decubiti not POA Wound / Incision (Open or Dehisced) 11/17/19 Puncture Abdomen Left;Anterior;Upper G-tube insertion site  (Active)  Date First Assessed/Time First Assessed: 11/17/19 1646   Wound Type: Puncture  Location: Abdomen  Location Orientation: Left;Anterior;Upper  Wound Description (Comments): G-tube insertion site   Present on Admission: No    Assessments 11/17/2019  4:47 PM  03/27/2020  9:36 PM  Dressing Type Gauze (Comment);Tape dressing Gauze (Comment)  Dressing Changed New Changed  Dressing Status Clean;Dry;Intact Clean;Dry;Intact  Dressing Change Frequency PRN Daily  Site / Wound Assessment Clean;Dry;Pink Dry;Clean;Red  Peri-wound Assessment Intact Intact  Closure None --  Drainage Amount None Scant  Drainage Description -- Purulent  Treatment Cleansed Cleansed     No Linked orders to display     Wound / Incision (Open or Dehisced) 03/16/20 Other (Comment) Foot Anterior;Left (Active)  Date First Assessed/Time First Assessed: 03/16/20 0800   Wound Type: (c) Other (Comment)  Location: Foot  Location Orientation: Anterior;Left  Present on Admission: No    Assessments 03/16/2020  8:00 AM 03/22/2020  7:58 PM  Dressing Type Other (Comment) Foam - Lift dressing to assess site every shift  Dressing Changed Changed Changed  Dressing Status Old drainage --  Dressing Change Frequency Daily --  Site / Wound Assessment Pink;Red --  Peri-wound Assessment Intact --  Drainage Amount Minimal --  Drainage Description Serosanguineous --  Treatment Cleansed --     No Linked orders to display      Other problems: Hypertension/grade 1 diastolic dysfunction:  -Continue amlodipine  -Echocardiogram this admission with preserved LVEF with evidence of grade   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   -Continue Keppra; levetiracetam level 35.9 on 11/12 -no further seizure activity since transitioned out of ICU setting therefore will discontinue seizure precaution  SIRS/Fever 2/2 recurrent enterococcus UTI -Patient developed mild elevation in temperature overnight to 1/13 associated with slight increase in respiratory secretions and congested sounding cough/abnormal pulmonary exam as well as tachycardia./14 respiratory status much improved and no further fevers. -Respiratory viral panel neg for COVID, influenza and RSV.   -UA slightly abnormal/cx c/w  enterococcus UTI- started on nitrofurantoin (prior UTI treated w/ augmentin) has completed 3 days of treatment -X-ray with bilateral opacities concerning for early pneumonia but CT of the chest without pneumonia, broad-spectrum antibiotics dc'd 1/14 -Because of dehydration increased frequency of free water from 200 cc every 4 hours to every 2 hours on 1/13- stopped on 1/15  Low-grade fever/abnormal urinalysis -2/8: Solitary T-max 100.5 overnight.  Likely related to atelectasis.  Obtain chest x-ray today that was unremarkable -As a precaution we will go ahead and obtain urinalysis and culture and blood cultures; electrolytes unremarkable, LDH normal at 178, no leukocytosis -Urinalysis abnormal with hazy appearance, trace leukocytes, rare bacteria and 11-20 WBCs-urine culture was never obtained and subsequently fever has resolved -Blood cultures are negative -Chest x-ray obtained on 2/14 unremarkable  Inguinal hernias -Evaluated by general surgery this admission. Documented as chronically incarcerated. Patient has been clinically stable and tolerating tube feedings and having bowel movements so unless patient obstructed would not be considered a candidate for repair. Even if obstructed consulting surgeon was not sure he would be a candidate for hernia repair regardless  Constipation -No documented bowel movement since 1/13 -We will continue scheduled Colace, fiber supplements per tube -Add daily MiraLAX -Given one-time dose milk of magnesia on 1/20-no documented bowel movement on flowsheet -Suspect requirement for narcotics and  multiple muscle relaxers contributing to decreased GI motility and constipation symptoms  Goals of care:  -Palliative care was involved during this hospitalization.   -Goals of care were discussed. remains full code.  -Palliative care recommended outpatient follow-up with palliative providers. -Reconsult Palliative medicine 1/27   Data Reviewed: Basic Metabolic  Panel: Recent Labs  Lab 03/25/20 0830  NA 141  K 3.9  CL 104  CO2 25  GLUCOSE 101*  BUN 18  CREATININE 0.64  CALCIUM 10.5*   Liver Function Tests: Recent Labs  Lab 03/25/20 0830  AST 25  ALT 19  ALKPHOS 88  BILITOT 0.4  PROT 8.0  ALBUMIN 3.6   No results for input(s): LIPASE, AMYLASE in the last 168 hours. No results for input(s): AMMONIA in the last 168 hours. CBC: Recent Labs  Lab 03/25/20 0830  WBC 3.7*  HGB 14.3  HCT 44.8  MCV 95.1  PLT 153   Cardiac Enzymes: No results for input(s): CKTOTAL, CKMB, CKMBINDEX, TROPONINI in the last 168 hours. BNP (last 3 results) Recent Labs    02/15/20 0433  BNP 3.9    ProBNP (last 3 results) No results for input(s): PROBNP in the last 8760 hours.  CBG: Recent Labs  Lab 03/27/20 0754 03/27/20 1249 03/27/20 1525 03/27/20 2019 03/27/20 2307  GLUCAP 87 90 90 147* 139*    No results found for this or any previous visit (from the past 240 hour(s)).   Studies: No results found.  Scheduled Meds: . acetaminophen (TYLENOL) oral liquid 160 mg/5 mL  650 mg Per Tube Q6H  . amLODipine  10 mg Per Tube Daily  . bacitracin   Topical BID  . baclofen  30 mg Per Tube TID  . chlorhexidine gluconate (MEDLINE KIT)  15 mL Mouth Rinse BID  . Darunavir-Cobicisctat-Emtricitabine-Tenofovir Alafenamide  1 tablet Oral Q breakfast  . famotidine  10.4 mg Per Tube BID  . feeding supplement (OSMOLITE 1.5 CAL)  237 mL Per Tube Q24H  . feeding supplement (OSMOLITE 1.5 CAL)  474 mL Per Tube TID  . feeding supplement (PROSource TF)  45 mL Per Tube TID  . fiber  1 packet Per Tube BID  . folic acid  1 mg Per Tube Daily  . free water  200 mL Per Tube Q4H  . gabapentin  200 mg Per Tube Q8H  . heparin injection (subcutaneous)  5,000 Units Subcutaneous Q8H  . levETIRAcetam  1,500 mg Per Tube BID  . magic mouthwash w/lidocaine  5 mL Oral QID  . Muscle Rub   Topical QID  . oxyCODONE  5 mg Per Tube Q6H  . polyethylene glycol  17 g Per Tube  QHS  . QUEtiapine  12.5 mg Per Tube QHS  . scopolamine  1 patch Transdermal Q72H  . sulfamethoxazole-trimethoprim  1 tablet Per Tube Once per day on Mon Wed Fri  . thiamine  100 mg Per Tube Daily  . tiZANidine  6 mg Per Tube Q6H   Continuous Infusions:   Active Problems:   Cardiac arrest [facility])   Community acquired pneumonia of left upper lobe of lung   Anoxic brain injury ([place])   Alcohol abuse   Acute respiratory failure with hypoxia (H[facility])   Vomiting   Pressure injury of skin   Small bowel obstruction (HCC)   Palliative care encounter   Bowel obstruction (HCC)   Chronic respiratory failure (HCC)   Diastolic dysfunction   Tracheostomy dependent ([place])   Dysphagia   Protein-calorie malnutrition ([place])  Seizures ([place])   Bilateral recurrent inguinal hernia   Muscle spasticity   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HCC)   Tracheobronchitis   Sequela of Corynebacterium infection   Abnormal urine   Urinary tract infection due to Enterococcus   Fever   Consultants:  PCCM  Palliative medicine  General surgery  Neurology  Interventional radiology  Procedures:  EEG  Echocardiogram  Tracheostomy  Antibiotics: Anti-infectives (From admission, onward)   Start     Dose/Rate Route Frequency Ordered Stop   02/17/20 1100  nitrofurantoin (FURADANTIN) 25 MG/5ML suspension 100 mg        100 mg Per Tube Every 12 hours 02/17/20 1006 02/20/20 0924   02/15/20 1000  vancomycin (VANCOCIN) IVPB 1000 mg/200 mL premix  Status:  Discontinued        1,000 mg 200 mL/hr over 60 Minutes Intravenous Every 12 hours 02/14/20 2226 02/16/20 0742   02/14/20 2245  piperacillin-tazobactam (ZOSYN) IVPB 3.375 g  Status:  Discontinued        3.375 g 12.5 mL/hr over 240 Minutes Intravenous Every 8 hours 02/14/20 2226 02/16/20 0742   02/14/20 2245  vancomycin (VANCOREADY) IVPB 1500 mg/300 mL        1,500 mg 150 mL/hr over 120 Minutes Intravenous  Once 02/14/20 2226 02/15/20 0739    02/01/20 2200  amoxicillin-clavulanate (AUGMENTIN) 250-62.5 MG/5ML suspension 500 mg        500 mg Per Tube Every 8 hours 02/01/20 1343 02/06/20 1333   01/31/20 1015  levofloxacin (LEVAQUIN) 25 MG/ML solution 500 mg  Status:  Discontinued        500 mg Per Tube Daily 01/31/20 0919 02/01/20 1343   01/31/20 1000  cefTRIAXone (ROCEPHIN) 1 g in sodium chloride 0.9 % 100 mL IVPB  Status:  Discontinued        1 g 200 mL/hr over 30 Minutes Intravenous Every 24 hours 01/31/20 0905 01/31/20 0919   01/19/20 2200  linezolid (ZYVOX) tablet 600 mg        600 mg Per Tube Every 12 hours 01/19/20 1534 01/25/20 2019   01/19/20 1100  levofloxacin (LEVAQUIN) tablet 500 mg  Status:  Discontinued        500 mg Per Tube Daily 01/19/20 1004 01/19/20 1534   12/19/19 0930  Darunavir-Cobicisctat-Emtricitabine-Tenofovir Alafenamide (SYMTUZA) 800-150-200-10 MG TABS 1 tablet        1 tablet Oral Daily with breakfast 12/19/19 0840     12/13/19 1000  bictegravir-emtricitabine-tenofovir AF (BIKTARVY) 50-200-25 MG per tablet 1 tablet  Status:  Discontinued        1 tablet Oral Daily 12/12/19 1413 12/19/19 0840   11/17/19 1548  ceFAZolin (ANCEF) 2-4 GM/100ML-% IVPB       Note to Pharmacy: Domenick Bookbinder   : cabinet override      11/17/19 1548 11/18/19 0359   11/16/19 1515  ceFAZolin (ANCEF) IVPB 2g/100 mL premix        2 g 200 mL/hr over 30 Minutes Intravenous To Radiology 11/16/19 1511 11/17/19 1625   11/15/19 0900  sulfamethoxazole-trimethoprim (BACTRIM DS) 800-160 MG per tablet 1 tablet        1 tablet Per Tube Once per day on Mon Wed Fri 11/13/19 1906     11/13/19 1615  dolutegravir (TIVICAY) tablet 50 mg  Status:  Discontinued        50 mg Oral Daily 11/13/19 1521 12/12/19 1413   11/13/19 1615  emtricitabine-tenofovir AF (DESCOVY) 200-25 MG per tablet 1 tablet  Status:  Discontinued  1 tablet Per Tube Daily 11/13/19 1521 12/12/19 1413   11/13/19 1530  sulfamethoxazole-trimethoprim (BACTRIM DS) 800-160 MG per  tablet 1 tablet  Status:  Discontinued        1 tablet Oral Once per day on Mon Wed Fri 11/13/19 1441 11/13/19 1906   11/13/19 1500  bictegravir-emtricitabine-tenofovir AF (BIKTARVY) 50-200-25 MG per tablet 1 tablet  Status:  Discontinued        1 tablet Oral Daily 11/13/19 1403 11/13/19 1521   11/10/19 1000  erythromycin 250 mg in sodium chloride 0.9 % 100 mL IVPB        250 mg 100 mL/hr over 60 Minutes Intravenous Every 8 hours 11/10/19 0907 11/11/19 2000   11/07/19 1200  ceFEPIme (MAXIPIME) 2 g in sodium chloride 0.9 % 100 mL IVPB        2 g 200 mL/hr over 30 Minutes Intravenous Every 8 hours 11/07/19 1013 11/13/19 2127   11/03/19 1200  cefTRIAXone (ROCEPHIN) 2 g in sodium chloride 0.9 % 100 mL IVPB        2 g 200 mL/hr over 30 Minutes Intravenous Every 24 hours 11/03/19 0922 11/05/19 1427   11/02/19 0800  vancomycin (VANCOREADY) IVPB 750 mg/150 mL  Status:  Discontinued        750 mg 150 mL/hr over 60 Minutes Intravenous Every 12 hours 11/01/19 1935 11/02/19 1105   11/02/19 0600  piperacillin-tazobactam (ZOSYN) IVPB 3.375 g  Status:  Discontinued        3.375 g 12.5 mL/hr over 240 Minutes Intravenous Every 8 hours 11/02/19 0311 11/03/19 0920   11/01/19 2200  ceFEPIme (MAXIPIME) 2 g in sodium chloride 0.9 % 100 mL IVPB  Status:  Discontinued        2 g 200 mL/hr over 30 Minutes Intravenous Every 12 hours 11/01/19 2143 11/02/19 0311   11/01/19 1915  vancomycin (VANCOREADY) IVPB 1500 mg/300 mL        1,500 mg 150 mL/hr over 120 Minutes Intravenous  Once 11/01/19 1909 11/01/19 2147   11/01/19 1845  cefTRIAXone (ROCEPHIN) 1 g in sodium chloride 0.9 % 100 mL IVPB        1 g 200 mL/hr over 30 Minutes Intravenous  Once 11/01/19 1842 11/01/19 2051   11/01/19 1845  azithromycin (ZITHROMAX) 500 mg in sodium chloride 0.9 % 250 mL IVPB        500 mg 250 mL/hr over 60 Minutes Intravenous  Once 11/01/19 1842 11/01/19 2051       Time spent: 20 minutes    Erin Hearing ANP  [facility]  7 am-330 pm/M-F for direct patient care and secure chat Please refer to [place] for contact information 03/28/2020, 7:37 AM  LOS: 148 days

## 2020-03-28 NOTE — TOC Progression Note (Signed)
Transition of Care [facility]) - Progression Note    Patient Details  Name: Ryan Orozco MRN: [id number] Date of Birth: 02-Oct-1965  Transition of Care [facility]) CM/SW Contact  Janae Bridgeman, RN Phone Number: 03/28/2020, 8:02 AM  Clinical Narrative:    CM and MSW called and spoke with Tammy, CM with [facility] in [place], [place] and the facility will not be open to admissions until Monday, 04/01/2020 due to facility recovery from COVID outbreak.  CM and MSW will continue to follow the patient for SNF admission when available.  Expected Discharge Plan: Skilled Nursing Facility Barriers to Discharge: Continued Medical Work up,SNF Pending bed offer  Expected Discharge Plan and Services Expected Discharge Plan: Skilled Nursing Facility   Discharge Planning Services: CM Consult Post Acute Care Choice: Nursing Home Living arrangements for the past 2 months: Single Family Home                                       Social Determinants of Health (SDOH) Interventions    Readmission Risk Interventions Readmission Risk Prevention Plan 02/06/2020  Transportation Screening Complete  PCP or Specialist Appt within 3-5 Days Complete  HRI or Home Care Consult Complete  Social Work Consult for Recovery Care Planning/Counseling Complete  Palliative Care Screening Complete  Medication Review [employer]) Complete  Some recent data might be hidden

## 2020-03-29 LAB — GLUCOSE, CAPILLARY
Glucose-Capillary: 83 mg/dL (ref 70–99)
Glucose-Capillary: 118 mg/dL — ABNORMAL HIGH (ref 70–99)
Glucose-Capillary: 114 mg/dL — ABNORMAL HIGH (ref 70–99)
Glucose-Capillary: 71 mg/dL (ref 70–99)
Glucose-Capillary: 85 mg/dL (ref 70–99)

## 2020-03-29 NOTE — Progress Notes (Signed)
[facility] PROGRESS NOTE  Ryan Orozco HCW:[id number] DOB: July 20, 1965 DOA: 11/01/2019 PCP: Default, Provider, MD       12/19/19                          03/15/20 OOB to chair   Status: Remains inpatient appropriate because:Altered mental status, Unsafe d/c plan and Inpatient level of care appropriate due to severity of illness. Patient newly diagnosed with anoxic brain injury  Dispo: The patient is from: Home              Anticipated d/c is to: SNF              Anticipated d/c date is: > 3 days              Patient currently is medically stable to d/c.  Barriers to discharge: No SNF bed offers.  Needs trach capable facility.  Still awaiting approval of Medicaid and disability.  No bed offers. Facility to review today 2/18   Code Status: DNR after d/w palliative team on 1/27 Family Communication: 2/21 updated dtr [name] -she flew to [facility] 2/21 DVT prophylaxis: Subcutaneous heparin Vaccination status: Will order first dose Pfizer COVID-vaccine on 1/10; next week we will order pneumococcal and influenza vaccinations-2nd dose ordered 277  Foley catheter:  No  HPI: 55 year old male with HIV, chronic alcohol abuse who was presented to the emergency department after being found unresponsive outside a convinient store.  EMS found him pulseless in PEA, unknown downtime, successfully resuscitated and brought to the emergency department.  UDS positive for THC and benzodiazepines.  CT head unremarkable.  Failed extubation trials due to severe anoxic brain injury and had tracheostomy placed.  PEG placed on 10/15.  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  This has led to significant pain as well.  [name] was consulted and recommendations/input/orders highly appreciated.  She may also benefit from Botox injections to both hips to aid in treatment of spasticity.   Significant Hospital Events   9/29  Admit post PEA arrest. UDS positive for THC, benzo's. ETOH 177 10/05 EEG ongoing. Versed restarted overnight due to agitation. Nicardipine increased.  10/06 Developed tachypnea with WUA, no follow commands off sedation  10/07 Vomiting, TF held / restarted with recurrent vomiting 10/16 tracheostomy collar for 9 hours 10/18->10/19 off vent all night  10/20> TC 11/15 > downsized to #4 Shiley flex CFS  Subjective: Alert and more animated today. Increased grunting and attempts to verbally communicate. Lifted both arms above head and was looking towards the ceiling. Happier when head of bed elevated.  Objective: Vitals:   03/29/20 0500 03/29/20 0754  BP:  112/78  Pulse: 72 66  Resp: 18 18  Temp:  98.7 F (37.1 C)  SpO2: 96% 99%    Intake/Output Summary (Last 24 hours) at 03/29/2020 0759 Last data filed at 03/29/2020 0001 Gross per 24 hour  Intake --  Output 600 ml  Net -600 ml   Filed Weights   03/15/20 0742 03/24/20 0500 03/25/20 0500  Weight: 74 kg 73.5 kg 73.5 kg    Exam: General: Awake, animated, no acute distress Pulmonary: Lungs are clear, FiO2 21% via humidified trach collar. No tracheal secretions Cardiac: Heart sounds S1-S2, no peripheral edema, extremities warm, pulse regular Abdomen: Soft nontender. With normal active bowel sounds. PEG tube for feedings and medications. LBM 2/24 Neurological: Severe hypertonicity/spasticity bilateral lower extremities involving the hip  flexors and hamstrings;  Keeps bilateral hips flexed secondary to hip flexor contractures essentially nonpurposeful use of upper extremities at times it does appear as if he is motioning as directionally in regards to repositioning.   Psychiatric: Awake and alert. Nonverbal so unable to adequately assess orientation Skin: Wound on dorsum of left foot covered with dressing.  No periwound erythema.  Assessment/Plan: Acute problems: Anoxic brain injury 2/2 PEA arrest/pain syndrome secondary to  hypertonicity/spastic tetraplegia:  -Presumed 2/2 combination alcohol and BZD overdose (UDS + for THC and BZDs) -Significant brain injury/spastic tetraplegia with patient now completely bedbound -Continue baclofen, gabapentin, scheduled oxycodone and Zanaflex for spastic tetraplegia -1/6 [name] with rehabilitative medicine performed Botox injection into the quadriceps and rectus femoris as well as the biceps femoris and hamstrings, semimembranosus and semitendinosus.  Unable to inject iliopsoas without EMG guidance w/o significant improvement and spasticity hypertonicity.  Unfortunately given lack of improvement he now has bilateral hip flexor contractures -Continue bilateral UE WHO resting splints/PROM q 4 hours/KREG bed -Continue Seroquel  -Continue out of bed to chair with lift  Blister dorsum left foot  03/15/20 -Appreciate wound care nurse assistance.   -Continue conservative wound care  Goals of care:  -Palliative care team reconsulted on 1/27 and after an extensive discussion daughter does realize that patient quite debilitated and would not survive a resuscitation therefore patient made a DNR  Chronic respiratory failure with inability to maintain patent airway requiring chronic tracheostomy tube/Recent Corynebacterium tracheobronchitis (resolved) -Continue #4 cuffless trach in place-trach changed out on 1/10 RT -Given altered mentation and inability to manage secretions PCCM states patient is not a candidate for elective decannulation  HIV:  -CD4 count of 124 October 2021 (had been as high as 250 Dec 2020)-as of today up to 259; viral load obtained on 1/15 was <20 -Dc'd Tivicay and Descovy infavor of Biktarvy to for eventual dc to SNF; per my discussion with infectious disease physician [name] on 12/23 preferred regimen would be Descovy and Tivicay-she will contact the HIV pharmacist to attempt to obtain discount cards to make this a more affordable option.  Once this has been  confirmed will transition back to Descovy and Tivicay -CD4 count on 12/23 up to 259 -ID recommends continuing prophylactic Bactrim an additional 3 months (last dose 04/24/2020)  Dysphagia 2/2 anoxic brain injury -Continue tube feeding per PEG -Given altered mentation patient not a candidate for further speech evaluations and will be permanently dependent on enteral tube feedings  Constipation -We will continue scheduled Colace, fiber supplements per tube as well as daily MiraLAX -Suspect requirement for narcotics and multiple muscle relaxers contributing to decreased GI motility and constipation symptoms  Protein calorie malnutrition nutrition Status: Nutrition Problem: Increased nutrient needs Etiology: acute illness Signs/Symptoms: estimated needs Interventions: Tube feeding bolus doses of Osmolite, Juven twice daily, Prosource TF 45 mL 3 times daily Estimated body mass index is 26.15 kg/m as calculated from the following:   Height as of this encounter: _0  (1.676 m).   Weight as of this encounter: 73.5 kg.   Pressure area dorsum left foot not POA Wound / Incision (Open or Dehisced) 11/17/19 Puncture Abdomen Left;Anterior;Upper G-tube insertion site  (Active)  Date First Assessed/Time First Assessed: 11/17/19 1646   Wound Type: Puncture  Location: Abdomen  Location Orientation: Left;Anterior;Upper  Wound Description (Comments): G-tube insertion site   Present on Admission: No    Assessments 11/17/2019  4:47 PM 03/28/2020 11:00 PM  Dressing Type Gauze (Comment);Tape dressing Gauze (Comment)  Dressing Changed New Changed  Dressing Status Clean;Dry;Intact Clean;Dry;Intact  Dressing Change Frequency PRN Daily  Site / Wound Assessment Clean;Dry;Pink Clean;Dry  Peri-wound Assessment Intact Intact  Closure None --  Drainage Amount None Scant  Drainage Description -- Purulent  Treatment Cleansed Cleansed     No Linked orders to display     Wound / Incision (Open or Dehisced) 03/16/20  Other (Comment) Foot Anterior;Left (Active)  Date First Assessed/Time First Assessed: 03/16/20 0800   Wound Type: (c) Other (Comment)  Location: Foot  Location Orientation: Anterior;Left  Present on Admission: No    Assessments 03/16/2020  8:00 AM 03/29/2020 10:00 AM  Dressing Type Other (Comment) Foam - Lift dressing to assess site every shift  Dressing Changed Changed New  Dressing Status Old drainage Clean;Dry;Intact  Dressing Change Frequency Daily --  Site / Wound Assessment Pink;Red Pink  Peri-wound Assessment Intact --  Drainage Amount Minimal --  Drainage Description Serosanguineous --  Treatment Cleansed --     No Linked orders to display      Other problems: Hypertension/grade 1 diastolic dysfunction:  -Continue amlodipine  -Echocardiogram this admission with preserved LVEF with evidence of grade   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   -Continue Keppra; levetiracetam level 35.9 on 11/12 -no further seizure activity since transitioned out of ICU setting therefore will discontinue seizure precaution  SIRS/Fever 2/2 recurrent enterococcus UTI -Patient developed mild elevation in temperature overnight to 1/13 associated with slight increase in respiratory secretions and congested sounding cough/abnormal pulmonary exam as well as tachycardia./14 respiratory status much improved and no further fevers. -Respiratory viral panel neg for COVID, influenza and RSV.   -UA slightly abnormal/cx c/w enterococcus UTI- started on nitrofurantoin (prior UTI treated w/ augmentin) has completed 3 days of treatment -X-ray with bilateral opacities concerning for early pneumonia but CT of the chest without pneumonia, broad-spectrum antibiotics dc'd 1/14 -Because of dehydration increased frequency of free water from 200 cc every 4 hours to every 2 hours on 1/13- stopped on 1/15  Fever/peri-PEG drainage positive for corynebacterium and rare Pseudomonas -PEG site with some  drainage but no erythema, induration or pain -Suspect may have colonization noting recently had corynebacterium tracheobronchitis treated -continue bacitracin ointment  Low-grade fever/abnormal urinalysis -2/8: Solitary T-max 100.5 overnight.  Likely related to atelectasis.  Obtain chest x-ray today that was unremarkable -As a precaution we will go ahead and obtain urinalysis and culture and blood cultures; electrolytes unremarkable, LDH normal at 178, no leukocytosis -Urinalysis abnormal with hazy appearance, trace leukocytes, rare bacteria and 11-20 WBCs-urine culture was never obtained and subsequently fever has resolved -Blood cultures are negative -Chest x-ray obtained on 2/14 unremarkable  Inguinal hernias -Evaluated by general surgery this admission. Documented as chronically incarcerated. Patient has been clinically stable and tolerating tube feedings and having bowel movements so unless patient obstructed would not be considered a candidate for repair. Even if obstructed consulting surgeon was not sure he would be a candidate for hernia repair regardless  Constipation -No documented bowel movement since 1/13 -We will continue scheduled Colace, fiber supplements per tube -Add daily MiraLAX -Given one-time dose milk of magnesia on 1/20-no documented bowel movement on flowsheet -Suspect requirement for narcotics and multiple muscle relaxers contributing to decreased GI motility and constipation symptoms  Goals of care:  -Palliative care was involved during this hospitalization.   -Goals of care were discussed. remains full code.  -Palliative care recommended outpatient follow-up with palliative providers. -Reconsult Palliative medicine 1/27   Data Reviewed:  Basic Metabolic Panel: Recent Labs  Lab 03/25/20 0830  NA 141  K 3.9  CL 104  CO2 25  GLUCOSE 101*  BUN 18  CREATININE 0.64  CALCIUM 10.5*   Liver Function Tests: Recent Labs  Lab 03/25/20 0830  AST 25  ALT 19   ALKPHOS 88  BILITOT 0.4  PROT 8.0  ALBUMIN 3.6   No results for input(s): LIPASE, AMYLASE in the last 168 hours. No results for input(s): AMMONIA in the last 168 hours. CBC: Recent Labs  Lab 03/25/20 0830  WBC 3.7*  HGB 14.3  HCT 44.8  MCV 95.1  PLT 153   Cardiac Enzymes: No results for input(s): CKTOTAL, CKMB, CKMBINDEX, TROPONINI in the last 168 hours. BNP (last 3 results) Recent Labs    02/15/20 0433  BNP 3.9    ProBNP (last 3 results) No results for input(s): PROBNP in the last 8760 hours.  CBG: Recent Labs  Lab 03/28/20 0751 03/28/20 1242 03/28/20 1732 03/28/20 1913 03/28/20 2315  GLUCAP 89 119* 105* 138* 117*    No results found for this or any previous visit (from the past 240 hour(s)).   Studies: No results found.  Scheduled Meds: . acetaminophen (TYLENOL) oral liquid 160 mg/5 mL  650 mg Per Tube Q6H  . amLODipine  10 mg Per Tube Daily  . bacitracin   Topical BID  . baclofen  30 mg Per Tube TID  . chlorhexidine gluconate (MEDLINE KIT)  15 mL Mouth Rinse BID  . Darunavir-Cobicisctat-Emtricitabine-Tenofovir Alafenamide  1 tablet Oral Q breakfast  . famotidine  10.4 mg Per Tube BID  . feeding supplement (OSMOLITE 1.5 CAL)  237 mL Per Tube Q24H  . feeding supplement (OSMOLITE 1.5 CAL)  474 mL Per Tube TID  . feeding supplement (PROSource TF)  45 mL Per Tube TID  . fiber  1 packet Per Tube BID  . folic acid  1 mg Per Tube Daily  . free water  200 mL Per Tube Q4H  . gabapentin  200 mg Per Tube Q8H  . heparin injection (subcutaneous)  5,000 Units Subcutaneous Q8H  . levETIRAcetam  1,500 mg Per Tube BID  . magic mouthwash w/lidocaine  5 mL Oral QID  . Muscle Rub   Topical QID  . oxyCODONE  5 mg Per Tube Q6H  . polyethylene glycol  17 g Per Tube QHS  . QUEtiapine  12.5 mg Per Tube QHS  . scopolamine  1 patch Transdermal Q72H  . sulfamethoxazole-trimethoprim  1 tablet Per Tube Once per day on Mon Wed Fri  . thiamine  100 mg Per Tube Daily  .  tiZANidine  6 mg Per Tube Q6H   Continuous Infusions:   Active Problems:   Cardiac arrest [facility])   Community acquired pneumonia of left upper lobe of lung   Anoxic brain injury ([place])   Alcohol abuse   Acute respiratory failure with hypoxia ([facility])   Vomiting   Pressure injury of skin   Small bowel obstruction (HCC)   Palliative care encounter   Bowel obstruction (HCC)   Chronic respiratory failure (HCC)   Diastolic dysfunction   Tracheostomy dependent (HCC)   Dysphagia   Protein-calorie malnutrition (HCC)   Seizures (HCC)   Bilateral recurrent inguinal hernia   Muscle spasticity   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HCC)   Tracheobronchitis   Sequela of Corynebacterium infection   Abnormal urine   Urinary tract infection due to Enterococcus   Fever   Consultants:  PCCM  Palliative medicine  General surgery  Neurology  Interventional radiology  Procedures:  EEG  Echocardiogram  Tracheostomy  Antibiotics: Anti-infectives (From admission, onward)   Start     Dose/Rate Route Frequency Ordered Stop   02/17/20 1100  nitrofurantoin (FURADANTIN) 25 MG/5ML suspension 100 mg        100 mg Per Tube Every 12 hours 02/17/20 1006 02/20/20 0924   02/15/20 1000  vancomycin (VANCOCIN) IVPB 1000 mg/200 mL premix  Status:  Discontinued        1,000 mg 200 mL/hr over 60 Minutes Intravenous Every 12 hours 02/14/20 2226 02/16/20 0742   02/14/20 2245  piperacillin-tazobactam (ZOSYN) IVPB 3.375 g  Status:  Discontinued        3.375 g 12.5 mL/hr over 240 Minutes Intravenous Every 8 hours 02/14/20 2226 02/16/20 0742   02/14/20 2245  vancomycin (VANCOREADY) IVPB 1500 mg/300 mL        1,500 mg 150 mL/hr over 120 Minutes Intravenous  Once 02/14/20 2226 02/15/20 0739   02/01/20 2200  amoxicillin-clavulanate (AUGMENTIN) 250-62.5 MG/5ML suspension 500 mg        500 mg Per Tube Every 8 hours 02/01/20 1343 02/06/20 1333   01/31/20 1015  levofloxacin (LEVAQUIN) 25 MG/ML solution  500 mg  Status:  Discontinued        500 mg Per Tube Daily 01/31/20 0919 02/01/20 1343   01/31/20 1000  cefTRIAXone (ROCEPHIN) 1 g in sodium chloride 0.9 % 100 mL IVPB  Status:  Discontinued        1 g 200 mL/hr over 30 Minutes Intravenous Every 24 hours 01/31/20 0905 01/31/20 0919   01/19/20 2200  linezolid (ZYVOX) tablet 600 mg        600 mg Per Tube Every 12 hours 01/19/20 1534 01/25/20 2019   01/19/20 1100  levofloxacin (LEVAQUIN) tablet 500 mg  Status:  Discontinued        500 mg Per Tube Daily 01/19/20 1004 01/19/20 1534   12/19/19 0930  Darunavir-Cobicisctat-Emtricitabine-Tenofovir Alafenamide (SYMTUZA) 800-150-200-10 MG TABS 1 tablet        1 tablet Oral Daily with breakfast 12/19/19 0840     12/13/19 1000  bictegravir-emtricitabine-tenofovir AF (BIKTARVY) 50-200-25 MG per tablet 1 tablet  Status:  Discontinued        1 tablet Oral Daily 12/12/19 1413 12/19/19 0840   11/17/19 1548  ceFAZolin (ANCEF) 2-4 GM/100ML-% IVPB       Note to Pharmacy: Domenick Bookbinder   : cabinet override      11/17/19 1548 11/18/19 0359   11/16/19 1515  ceFAZolin (ANCEF) IVPB 2g/100 mL premix        2 g 200 mL/hr over 30 Minutes Intravenous To Radiology 11/16/19 1511 11/17/19 1625   11/15/19 0900  sulfamethoxazole-trimethoprim (BACTRIM DS) 800-160 MG per tablet 1 tablet        1 tablet Per Tube Once per day on Mon Wed Fri 11/13/19 1906     11/13/19 1615  dolutegravir (TIVICAY) tablet 50 mg  Status:  Discontinued        50 mg Oral Daily 11/13/19 1521 12/12/19 1413   11/13/19 1615  emtricitabine-tenofovir AF (DESCOVY) 200-25 MG per tablet 1 tablet  Status:  Discontinued        1 tablet Per Tube Daily 11/13/19 1521 12/12/19 1413   11/13/19 1530  sulfamethoxazole-trimethoprim (BACTRIM DS) 800-160 MG per tablet 1 tablet  Status:  Discontinued        1 tablet Oral Once per day on Mon Wed Fri  11/13/19 1441 11/13/19 1906   11/13/19 1500  bictegravir-emtricitabine-tenofovir AF (BIKTARVY) 50-200-25 MG per tablet 1  tablet  Status:  Discontinued        1 tablet Oral Daily 11/13/19 1403 11/13/19 1521   11/10/19 1000  erythromycin 250 mg in sodium chloride 0.9 % 100 mL IVPB        250 mg 100 mL/hr over 60 Minutes Intravenous Every 8 hours 11/10/19 0907 11/11/19 2000   11/07/19 1200  ceFEPIme (MAXIPIME) 2 g in sodium chloride 0.9 % 100 mL IVPB        2 g 200 mL/hr over 30 Minutes Intravenous Every 8 hours 11/07/19 1013 11/13/19 2127   11/03/19 1200  cefTRIAXone (ROCEPHIN) 2 g in sodium chloride 0.9 % 100 mL IVPB        2 g 200 mL/hr over 30 Minutes Intravenous Every 24 hours 11/03/19 0922 11/05/19 1427   11/02/19 0800  vancomycin (VANCOREADY) IVPB 750 mg/150 mL  Status:  Discontinued        750 mg 150 mL/hr over 60 Minutes Intravenous Every 12 hours 11/01/19 1935 11/02/19 1105   11/02/19 0600  piperacillin-tazobactam (ZOSYN) IVPB 3.375 g  Status:  Discontinued        3.375 g 12.5 mL/hr over 240 Minutes Intravenous Every 8 hours 11/02/19 0311 11/03/19 0920   11/01/19 2200  ceFEPIme (MAXIPIME) 2 g in sodium chloride 0.9 % 100 mL IVPB  Status:  Discontinued        2 g 200 mL/hr over 30 Minutes Intravenous Every 12 hours 11/01/19 2143 11/02/19 0311   11/01/19 1915  vancomycin (VANCOREADY) IVPB 1500 mg/300 mL        1,500 mg 150 mL/hr over 120 Minutes Intravenous  Once 11/01/19 1909 11/01/19 2147   11/01/19 1845  cefTRIAXone (ROCEPHIN) 1 g in sodium chloride 0.9 % 100 mL IVPB        1 g 200 mL/hr over 30 Minutes Intravenous  Once 11/01/19 1842 11/01/19 2051   11/01/19 1845  azithromycin (ZITHROMAX) 500 mg in sodium chloride 0.9 % 250 mL IVPB        500 mg 250 mL/hr over 60 Minutes Intravenous  Once 11/01/19 1842 11/01/19 2051       Time spent: 20 minutes    Erin Hearing ANP  [facility]  7 am-330 pm/M-F for direct patient care and secure chat Please refer to [place] for contact information 03/29/2020, 7:59 AM  LOS: 149 days

## 2020-03-30 LAB — GLUCOSE, CAPILLARY
Glucose-Capillary: 134 mg/dL — ABNORMAL HIGH (ref 70–99)
Glucose-Capillary: 148 mg/dL — ABNORMAL HIGH (ref 70–99)
Glucose-Capillary: 95 mg/dL (ref 70–99)

## 2020-03-30 NOTE — Progress Notes (Signed)
[facility] PROGRESS NOTE  Ryan Orozco KWI:[id number] DOB: January 21, 1966 DOA: 11/01/2019 PCP: Default, Provider, MD   Status: Remains inpatient appropriate because:Altered mental status, Unsafe d/c plan and Inpatient level of care appropriate due to severity of illness. Patient newly diagnosed with anoxic brain injury  Dispo: The patient is from: Home              Anticipated d/c is to: SNF              Anticipated d/c date is: > 3 days              Patient currently is medically stable to d/c.  Barriers to discharge: No SNF bed offers.  Needs trach capable facility.  Still awaiting approval of Medicaid and disability.  No bed offers. Facility to review today 2/18   Code Status: DNR after d/w palliative team on 1/27 Family Communication: 2/21 updated dtr Ryan Orozco -she flew to [facility] 2/21 DVT prophylaxis: Subcutaneous heparin Vaccination status: Will order first dose Pfizer COVID-vaccine on 1/10; next week we will order pneumococcal and influenza vaccinations-2nd dose ordered 277  Foley catheter:  No  HPI: 55 year old male with HIV, chronic alcohol abuse who was presented to the emergency department after being found unresponsive outside a convinient store.  EMS found him pulseless in PEA, unknown downtime, successfully resuscitated and brought to the emergency department.  UDS positive for THC and benzodiazepines.  CT head unremarkable.  Failed extubation trials due to severe anoxic brain injury and had tracheostomy placed.  PEG placed on 10/15.  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  This has led to significant pain as well.  [name] was consulted and recommendations/input/orders highly appreciated.  She may also benefit from Botox injections to both hips to aid in treatment of spasticity.   Significant Hospital Events   9/29 Admit post PEA arrest. UDS positive for THC, benzo's. ETOH  177 10/05 EEG ongoing. Versed restarted overnight due to agitation. Nicardipine increased.  10/06 Developed tachypnea with WUA, no follow commands off sedation  10/07 Vomiting, TF held / restarted with recurrent vomiting 10/16 tracheostomy collar for 9 hours 10/18->10/19 off vent all night  10/20> TC 11/15 > downsized to #4 Shiley flex CFS  Subjective:  Patient in bed appears to be in no distress, resting comfortably, unable to answer questions reliably or follow commands.  Objective: Vitals:   03/30/20 0645 03/30/20 0818  BP: 128/81   Pulse: 78 73  Resp: (!) 21 17  Temp: 98.7 F (37.1 C)   SpO2:  95%    Intake/Output Summary (Last 24 hours) at 03/30/2020 0835 Last data filed at 03/30/2020 0651 Gross per 24 hour  Intake --  Output 1950 ml  Net -1950 ml   Filed Weights   03/24/20 0500 03/25/20 0500 03/30/20 0340  Weight: 73.5 kg 73.5 kg 73.5 kg    Exam:  Awake but minimally responsive, unable to reliably follow commands or answer questions, trach site appears clean with trach collar, PEG tube site noted, no soiling on the dressing.  Severe contractures in both lower extremities, left foot dorsum under dressing, [place].AT,PERRAL Supple Neck,No JVD, No cervical lymphadenopathy appriciated.  Symmetrical Chest wall movement, Good air movement bilaterally, CTAB RRR,No Gallops, Rubs or new Murmurs, No Parasternal Heave +ve B.Sounds, Abd Soft, No tenderness, No organomegaly appriciated, No rebound - guarding or rigidity. No Cyanosis, Clubbing or edema, No new Rash or bruise   Assessment/Plan: Acute problems: Anoxic  brain injury 2/2 PEA arrest/pain syndrome secondary to hypertonicity/spastic tetraplegia:  -Presumed 2/2 combination alcohol and BZD overdose (UDS + for THC and BZDs) -Significant brain injury/spastic tetraplegia with patient now completely bedbound -Continue baclofen, gabapentin, scheduled oxycodone and Zanaflex for spastic tetraplegia -1/6 [name] with  rehabilitative medicine performed Botox injection into the quadriceps and rectus femoris as well as the biceps femoris and hamstrings, semimembranosus and semitendinosus.  Unable to inject iliopsoas without EMG guidance w/o significant improvement and spasticity hypertonicity.  Unfortunately given lack of improvement he now has bilateral hip flexor contractures -Continue bilateral UE WHO resting splints/PROM q 4 hours/KREG bed -Continue Seroquel  -Continue out of bed to chair with lift  Blister dorsum left foot  -Appreciate wound care nurse assistance, Continue conservative wound care.   Goals of care:  -Palliative care team reconsulted on 1/27 and after an extensive discussion daughter does realize that patient quite debilitated and would not survive a resuscitation therefore patient made a DNR  Chronic respiratory failure with inability to maintain patent airway requiring chronic tracheostomy tube/Recent Corynebacterium tracheobronchitis (resolved) -Continue #4 cuffless trach in place-trach changed out on 1/10 RT -Given altered mentation and inability to manage secretions PCCM states patient is not a candidate for elective decannulation  HIV:  -CD4 count of 124 October 2021 (had been as high as 250 Dec 2020)-as of today up to 259; viral load obtained on 1/15 was <20 -Dc'd Tivicay and Descovy infavor of Biktarvy to for eventual dc to SNF; per my discussion with infectious disease physician [name] on 12/23 preferred regimen would be Descovy and Tivicay-she will contact the HIV pharmacist to attempt to obtain discount cards to make this a more affordable option.  Once this has been confirmed will transition back to Descovy and Tivicay -CD4 count on 12/23 up to 259 -ID recommends continuing prophylactic Bactrim an additional 3 months (last dose 04/24/2020)  Dysphagia 2/2 anoxic brain injury -Continue tube feeding per PEG -Given altered mentation patient not a candidate for further speech  evaluations and will be permanently dependent on enteral tube feedings  Constipation -We will continue scheduled Colace, fiber supplements per tube as well as daily MiraLAX -Suspect requirement for narcotics and multiple muscle relaxers contributing to decreased GI motility and constipation symptoms  Protein calorie malnutrition nutrition Status: Nutrition Problem: Increased nutrient needs Etiology: acute illness Signs/Symptoms: estimated needs Interventions: Tube feeding bolus doses of Osmolite, Juven twice daily, Prosource TF 45 mL 3 times daily Estimated body mass index is 26.15 kg/m as calculated from the following:   Height as of this encounter: _0  (1.676 m).   Weight as of this encounter: 73.5 kg.   Pressure area dorsum left foot not POA Wound / Incision (Open or Dehisced) 11/17/19 Puncture Abdomen Left;Anterior;Upper G-tube insertion site  (Active)  Date First Assessed/Time First Assessed: 11/17/19 1646   Wound Type: Puncture  Location: Abdomen  Location Orientation: Left;Anterior;Upper  Wound Description (Comments): G-tube insertion site   Present on Admission: No    Assessments 11/17/2019  4:47 PM 03/28/2020 11:00 PM  Dressing Type Gauze (Comment);Tape dressing Gauze (Comment)  Dressing Changed New Changed  Dressing Status Clean;Dry;Intact Clean;Dry;Intact  Dressing Change Frequency PRN Daily  Site / Wound Assessment Clean;Dry;Pink Clean;Dry  Peri-wound Assessment Intact Intact  Closure None --  Drainage Amount None Scant  Drainage Description -- Purulent  Treatment Cleansed Cleansed     No Linked orders to display     Wound / Incision (Open or Dehisced) 03/16/20 Other (Comment) Foot Anterior;Left (  Active)  Date First Assessed/Time First Assessed: 03/16/20 0800   Wound Type: (c) Other (Comment)  Location: Foot  Location Orientation: Anterior;Left  Present on Admission: No    Assessments 03/16/2020  8:00 AM 03/29/2020 10:00 AM  Dressing Type Other (Comment) Foam - Lift  dressing to assess site every shift  Dressing Changed Changed New  Dressing Status Old drainage Clean;Dry;Intact  Dressing Change Frequency Daily --  Site / Wound Assessment Pink;Red Pink  Peri-wound Assessment Intact --  Drainage Amount Minimal --  Drainage Description Serosanguineous --  Treatment Cleansed --     No Linked orders to display      Other problems: Hypertension/grade 1 diastolic dysfunction:  -Continue amlodipine  -Echocardiogram this admission with preserved LVEF with evidence of grade   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   -Continue Keppra; levetiracetam level 35.9 on 11/12 -no further seizure activity since transitioned out of ICU setting therefore will discontinue seizure precaution  SIRS/Fever 2/2 recurrent enterococcus UTI -Patient developed mild elevation in temperature overnight to 1/13 associated with slight increase in respiratory secretions and congested sounding cough/abnormal pulmonary exam as well as tachycardia./14 respiratory status much improved and no further fevers. -Respiratory viral panel neg for COVID, influenza and RSV.   -UA slightly abnormal/cx c/w enterococcus UTI- started on nitrofurantoin (prior UTI treated w/ augmentin) has completed 3 days of treatment -X-ray with bilateral opacities concerning for early pneumonia but CT of the chest without pneumonia, broad-spectrum antibiotics dc'd 1/14 -Because of dehydration increased frequency of free water from 200 cc every 4 hours to every 2 hours on 1/13- stopped on 1/15  Fever/peri-PEG drainage positive for corynebacterium and rare Pseudomonas -PEG site with some drainage but no erythema, induration or pain -Suspect may have colonization noting recently had corynebacterium tracheobronchitis treated -continue bacitracin ointment  Low-grade fever/abnormal urinalysis -2/8: Solitary T-max 100.5 overnight.  Likely related to atelectasis.  Obtain chest x-ray today that was  unremarkable -As a precaution we will go ahead and obtain urinalysis and culture and blood cultures; electrolytes unremarkable, LDH normal at 178, no leukocytosis -Urinalysis abnormal with hazy appearance, trace leukocytes, rare bacteria and 11-20 WBCs-urine culture was never obtained and subsequently fever has resolved -Blood cultures are negative -Chest x-ray obtained on 2/14 unremarkable  Inguinal hernias -Evaluated by general surgery this admission. Documented as chronically incarcerated. Patient has been clinically stable and tolerating tube feedings and having bowel movements so unless patient obstructed would not be considered a candidate for repair. Even if obstructed consulting surgeon was not sure he would be a candidate for hernia repair regardless  Constipation -No documented bowel movement since 1/13 -We will continue scheduled Colace, fiber supplements per tube -Add daily MiraLAX -Given one-time dose milk of magnesia on 1/20-no documented bowel movement on flowsheet -Suspect requirement for narcotics and multiple muscle relaxers contributing to decreased GI motility and constipation symptoms  Goals of care:  -Palliative care was involved during this hospitalization.   -Goals of care were discussed. remains full code.  -Palliative care recommended outpatient follow-up with palliative providers. -Reconsult Palliative medicine 1/27   Data Reviewed: Basic Metabolic Panel: Recent Labs  Lab 03/25/20 0830  NA 141  K 3.9  CL 104  CO2 25  GLUCOSE 101*  BUN 18  CREATININE 0.64  CALCIUM 10.5*   Liver Function Tests: Recent Labs  Lab 03/25/20 0830  AST 25  ALT 19  ALKPHOS 88  BILITOT 0.4  PROT 8.0  ALBUMIN 3.6   No results for input(s): LIPASE,  AMYLASE in the last 168 hours. No results for input(s): AMMONIA in the last 168 hours. CBC: Recent Labs  Lab 03/25/20 0830  WBC 3.7*  HGB 14.3  HCT 44.8  MCV 95.1  PLT 153   Cardiac Enzymes: No results for  input(s): CKTOTAL, CKMB, CKMBINDEX, TROPONINI in the last 168 hours. BNP (last 3 results) Recent Labs    02/15/20 0433  BNP 3.9    ProBNP (last 3 results) No results for input(s): PROBNP in the last 8760 hours.  CBG: Recent Labs  Lab 03/29/20 0715 03/29/20 1105 03/29/20 1605 03/29/20 1944 03/30/20 0024  GLUCAP 118* 114* 71 85 95    No results found for this or any previous visit (from the past 240 hour(s)).   Studies: No results found.  Scheduled Meds: . acetaminophen (TYLENOL) oral liquid 160 mg/5 mL  650 mg Per Tube Q6H  . amLODipine  10 mg Per Tube Daily  . bacitracin   Topical BID  . baclofen  30 mg Per Tube TID  . chlorhexidine gluconate (MEDLINE KIT)  15 mL Mouth Rinse BID  . Darunavir-Cobicisctat-Emtricitabine-Tenofovir Alafenamide  1 tablet Oral Q breakfast  . famotidine  10.4 mg Per Tube BID  . feeding supplement (OSMOLITE 1.5 CAL)  237 mL Per Tube Q24H  . feeding supplement (OSMOLITE 1.5 CAL)  474 mL Per Tube TID  . feeding supplement (PROSource TF)  45 mL Per Tube TID  . fiber  1 packet Per Tube BID  . folic acid  1 mg Per Tube Daily  . free water  200 mL Per Tube Q4H  . gabapentin  200 mg Per Tube Q8H  . heparin injection (subcutaneous)  5,000 Units Subcutaneous Q8H  . levETIRAcetam  1,500 mg Per Tube BID  . magic mouthwash w/lidocaine  5 mL Oral QID  . Muscle Rub   Topical QID  . oxyCODONE  5 mg Per Tube Q6H  . polyethylene glycol  17 g Per Tube QHS  . QUEtiapine  12.5 mg Per Tube QHS  . scopolamine  1 patch Transdermal Q72H  . sulfamethoxazole-trimethoprim  1 tablet Per Tube Once per day on Mon Wed Fri  . thiamine  100 mg Per Tube Daily  . tiZANidine  6 mg Per Tube Q6H   Continuous Infusions:   Active Problems:   Cardiac arrest [facility])   Community acquired pneumonia of left upper lobe of lung   Anoxic brain injury ([place])   Alcohol abuse   Acute respiratory failure with hypoxia (HCC)   Vomiting   Pressure injury of skin   Small bowel  obstruction (HCC)   Palliative care encounter   Bowel obstruction (HCC)   Chronic respiratory failure (HCC)   Diastolic dysfunction   Tracheostomy dependent (HCC)   Dysphagia   Protein-calorie malnutrition (HCC)   Seizures (HCC)   Bilateral recurrent inguinal hernia   Muscle spasticity   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HCC)   Tracheobronchitis   Sequela of Corynebacterium infection   Abnormal urine   Urinary tract infection due to Enterococcus   Fever   Consultants:  PCCM  Palliative medicine  General surgery  Neurology  Interventional radiology  Procedures:  EEG  Echocardiogram  Tracheostomy  Antibiotics: Anti-infectives (From admission, onward)   Start     Dose/Rate Route Frequency Ordered Stop   02/17/20 1100  nitrofurantoin (FURADANTIN) 25 MG/5ML suspension 100 mg        100 mg Per Tube Every 12 hours 02/17/20 1006 02/20/20 0924   02/15/20  1000  vancomycin (VANCOCIN) IVPB 1000 mg/200 mL premix  Status:  Discontinued        1,000 mg 200 mL/hr over 60 Minutes Intravenous Every 12 hours 02/14/20 2226 02/16/20 0742   02/14/20 2245  piperacillin-tazobactam (ZOSYN) IVPB 3.375 g  Status:  Discontinued        3.375 g 12.5 mL/hr over 240 Minutes Intravenous Every 8 hours 02/14/20 2226 02/16/20 0742   02/14/20 2245  vancomycin (VANCOREADY) IVPB 1500 mg/300 mL        1,500 mg 150 mL/hr over 120 Minutes Intravenous  Once 02/14/20 2226 02/15/20 0739   02/01/20 2200  amoxicillin-clavulanate (AUGMENTIN) 250-62.5 MG/5ML suspension 500 mg        500 mg Per Tube Every 8 hours 02/01/20 1343 02/06/20 1333   01/31/20 1015  levofloxacin (LEVAQUIN) 25 MG/ML solution 500 mg  Status:  Discontinued        500 mg Per Tube Daily 01/31/20 0919 02/01/20 1343   01/31/20 1000  cefTRIAXone (ROCEPHIN) 1 g in sodium chloride 0.9 % 100 mL IVPB  Status:  Discontinued        1 g 200 mL/hr over 30 Minutes Intravenous Every 24 hours 01/31/20 0905 01/31/20 0919   01/19/20 2200   linezolid (ZYVOX) tablet 600 mg        600 mg Per Tube Every 12 hours 01/19/20 1534 01/25/20 2019   01/19/20 1100  levofloxacin (LEVAQUIN) tablet 500 mg  Status:  Discontinued        500 mg Per Tube Daily 01/19/20 1004 01/19/20 1534   12/19/19 0930  Darunavir-Cobicisctat-Emtricitabine-Tenofovir Alafenamide (SYMTUZA) 800-150-200-10 MG TABS 1 tablet        1 tablet Oral Daily with breakfast 12/19/19 0840     12/13/19 1000  bictegravir-emtricitabine-tenofovir AF (BIKTARVY) 50-200-25 MG per tablet 1 tablet  Status:  Discontinued        1 tablet Oral Daily 12/12/19 1413 12/19/19 0840   11/17/19 1548  ceFAZolin (ANCEF) 2-4 GM/100ML-% IVPB       Note to Pharmacy: Domenick Bookbinder   : cabinet override      11/17/19 1548 11/18/19 0359   11/16/19 1515  ceFAZolin (ANCEF) IVPB 2g/100 mL premix        2 g 200 mL/hr over 30 Minutes Intravenous To Radiology 11/16/19 1511 11/17/19 1625   11/15/19 0900  sulfamethoxazole-trimethoprim (BACTRIM DS) 800-160 MG per tablet 1 tablet        1 tablet Per Tube Once per day on Mon Wed Fri 11/13/19 1906     11/13/19 1615  dolutegravir (TIVICAY) tablet 50 mg  Status:  Discontinued        50 mg Oral Daily 11/13/19 1521 12/12/19 1413   11/13/19 1615  emtricitabine-tenofovir AF (DESCOVY) 200-25 MG per tablet 1 tablet  Status:  Discontinued        1 tablet Per Tube Daily 11/13/19 1521 12/12/19 1413   11/13/19 1530  sulfamethoxazole-trimethoprim (BACTRIM DS) 800-160 MG per tablet 1 tablet  Status:  Discontinued        1 tablet Oral Once per day on Mon Wed Fri 11/13/19 1441 11/13/19 1906   11/13/19 1500  bictegravir-emtricitabine-tenofovir AF (BIKTARVY) 50-200-25 MG per tablet 1 tablet  Status:  Discontinued        1 tablet Oral Daily 11/13/19 1403 11/13/19 1521   11/10/19 1000  erythromycin 250 mg in sodium chloride 0.9 % 100 mL IVPB        250 mg 100 mL/hr over 60 Minutes Intravenous Every  8 hours 11/10/19 0907 11/11/19 2000   11/07/19 1200  ceFEPIme (MAXIPIME) 2 g in  sodium chloride 0.9 % 100 mL IVPB        2 g 200 mL/hr over 30 Minutes Intravenous Every 8 hours 11/07/19 1013 11/13/19 2127   11/03/19 1200  cefTRIAXone (ROCEPHIN) 2 g in sodium chloride 0.9 % 100 mL IVPB        2 g 200 mL/hr over 30 Minutes Intravenous Every 24 hours 11/03/19 0922 11/05/19 1427   11/02/19 0800  vancomycin (VANCOREADY) IVPB 750 mg/150 mL  Status:  Discontinued        750 mg 150 mL/hr over 60 Minutes Intravenous Every 12 hours 11/01/19 1935 11/02/19 1105   11/02/19 0600  piperacillin-tazobactam (ZOSYN) IVPB 3.375 g  Status:  Discontinued        3.375 g 12.5 mL/hr over 240 Minutes Intravenous Every 8 hours 11/02/19 0311 11/03/19 0920   11/01/19 2200  ceFEPIme (MAXIPIME) 2 g in sodium chloride 0.9 % 100 mL IVPB  Status:  Discontinued        2 g 200 mL/hr over 30 Minutes Intravenous Every 12 hours 11/01/19 2143 11/02/19 0311   11/01/19 1915  vancomycin (VANCOREADY) IVPB 1500 mg/300 mL        1,500 mg 150 mL/hr over 120 Minutes Intravenous  Once 11/01/19 1909 11/01/19 2147   11/01/19 1845  cefTRIAXone (ROCEPHIN) 1 g in sodium chloride 0.9 % 100 mL IVPB        1 g 200 mL/hr over 30 Minutes Intravenous  Once 11/01/19 1842 11/01/19 2051   11/01/19 1845  azithromycin (ZITHROMAX) 500 mg in sodium chloride 0.9 % 250 mL IVPB        500 mg 250 mL/hr over 60 Minutes Intravenous  Once 11/01/19 1842 11/01/19 2051      Signature  Lala Lund M.D on 03/30/2020 at 8:35 AM   -  To page go to [email or web address]     Time 15 mins

## 2020-03-31 LAB — COMPREHENSIVE METABOLIC PANEL
ALT: 18 U/L (ref 0–44)
AST: 21 U/L (ref 15–41)
Albumin: 3.6 g/dL (ref 3.5–5.0)
Alkaline Phosphatase: 92 U/L (ref 38–126)
Anion gap: 11 (ref 5–15)
BUN: 18 mg/dL (ref 6–20)
CO2: 28 mmol/L (ref 22–32)
Calcium: 10.7 mg/dL — ABNORMAL HIGH (ref 8.9–10.3)
Chloride: 101 mmol/L (ref 98–111)
Creatinine, Ser: 0.71 mg/dL (ref 0.61–1.24)
GFR, Estimated: >60 mL/min (ref >60)
Glucose, Bld: 90 mg/dL (ref 70–99)
Potassium: 4.1 mmol/L (ref 3.5–5.1)
Sodium: 140 mmol/L (ref 135–145)
Total Bilirubin: 0.4 mg/dL (ref 0.3–1.2)
Total Protein: 7.7 g/dL (ref 6.5–8.1)

## 2020-03-31 LAB — CBC WITH DIFFERENTIAL/PLATELET
Abs Immature Granulocytes: 0.02 10*3/uL (ref 0.00–0.07)
Basophils Absolute: 0 10*3/uL (ref 0.0–0.1)
Basophils Relative: 1 %
Eosinophils Absolute: 0.2 10*3/uL (ref 0.0–0.5)
Eosinophils Relative: 3 %
HCT: 41.8 % (ref 39.0–52.0)
Hemoglobin: 13.7 g/dL (ref 13.0–17.0)
Immature Granulocytes: 0 %
Lymphocytes Relative: 23 %
Lymphs Abs: 1.1 10*3/uL (ref 0.7–4.0)
MCH: 30.9 pg (ref 26.0–34.0)
MCHC: 32.8 g/dL (ref 30.0–36.0)
MCV: 94.4 fL (ref 80.0–100.0)
Monocytes Absolute: 0.6 10*3/uL (ref 0.1–1.0)
Monocytes Relative: 12 %
Neutro Abs: 2.9 10*3/uL (ref 1.7–7.7)
Neutrophils Relative %: 61 %
Platelets: 142 10*3/uL — ABNORMAL LOW (ref 150–400)
RBC: 4.43 MIL/uL (ref 4.22–5.81)
RDW: 13.1 % (ref 11.5–15.5)
WBC: 4.7 10*3/uL (ref 4.0–10.5)
nRBC: 0 % (ref 0.0–0.2)

## 2020-03-31 LAB — MAGNESIUM: Magnesium: 1.9 mg/dL (ref 1.7–2.4)

## 2020-03-31 NOTE — Progress Notes (Signed)
[facility] PROGRESS NOTE  Axell [place] QMG:[id number] DOB: [birth year] DOA: 11/01/2019 PCP: Default, Provider, MD   Status: Remains inpatient appropriate because:Altered mental status, Unsafe d/c plan and Inpatient level of care appropriate due to severity of illness. Patient newly diagnosed with anoxic brain injury  Dispo: The patient is from: Home              Anticipated d/c is to: SNF              Anticipated d/c date is: > 3 days              Patient currently is medically stable to d/c.  Barriers to discharge: No SNF bed offers.  Needs trach capable facility.  Still awaiting approval of Medicaid and disability.  No bed offers. Facility to review today 2/18   Code Status: DNR after d/w palliative team on 1/27 Family Communication: 2/21 updated dtr [name] -she flew to [facility] 2/21 DVT prophylaxis: Subcutaneous heparin Vaccination status: Will order first dose Pfizer COVID-vaccine on 1/10; next week we will order pneumococcal and influenza vaccinations-2nd dose ordered 277  Foley catheter:  No  HPI:  55 year old male with HIV, chronic alcohol abuse who was presented to the emergency department after being found unresponsive outside a convinient store.  EMS found him pulseless in PEA, unknown downtime, successfully resuscitated and brought to the emergency department.  UDS positive for THC and benzodiazepines.  CT head unremarkable.  Failed extubation trials due to severe anoxic brain injury and had tracheostomy placed.  PEG placed on 10/15.  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  This has led to significant pain as well.  [name] was consulted and recommendations/input/orders highly appreciated.  She may also benefit from Botox injections to both hips to aid in treatment of spasticity.   Significant Hospital Events   9/29 Admit post PEA arrest. UDS positive for THC, benzo's. ETOH  177 10/05 EEG ongoing. Versed restarted overnight due to agitation. Nicardipine increased.  10/06 Developed tachypnea with WUA, no follow commands off sedation  10/07 Vomiting, TF held / restarted with recurrent vomiting 10/16 tracheostomy collar for 9 hours 10/18->10/19 off vent all night  10/20> TC 11/15 > downsized to #4 Shiley flex CFS  Subjective:  Patient remains in bed, severely contracted at baseline, in no distress, follows intermittent commands but unreliable.  Objective: Vitals:   03/31/20 0337 03/31/20 0527  BP:  122/75  Pulse: 80 83  Resp: 18 19  Temp:  98.2 F (36.8 C)  SpO2: 95% 98%    Intake/Output Summary (Last 24 hours) at 03/31/2020 0847 Last data filed at 03/31/2020 0600 Gross per 24 hour  Intake 400 ml  Output 1700 ml  Net -1300 ml   Filed Weights   03/24/20 0500 03/25/20 0500 03/30/20 0340  Weight: 73.5 kg 73.5 kg 73.5 kg    Exam: (unchanged from 03/31/20)  Awake but minimally responsive, unable to reliably follow commands or answer questions, trach site appears clean with trach collar, PEG tube site noted, no soiling on the dressing.  Severe contractures in both lower extremities, left foot dorsum under dressing, [place].AT,PERRAL Supple Neck,No JVD, No cervical lymphadenopathy appriciated.  Symmetrical Chest wall movement, Good air movement bilaterally, CTAB RRR,No Gallops, Rubs or new Murmurs, No Parasternal Heave +ve B.Sounds, Abd Soft, No tenderness, No organomegaly appriciated, No rebound - guarding or rigidity. No Cyanosis, Clubbing or edema, No new Rash or bruise   Assessment/Plan: Acute problems:  Anoxic brain injury 2/2 PEA arrest/pain syndrome secondary to hypertonicity/spastic tetraplegia:  -Presumed 2/2 combination alcohol and BZD overdose (UDS + for THC and BZDs) -Significant brain injury/spastic tetraplegia with patient now completely bedbound -Continue baclofen, gabapentin, scheduled oxycodone and Zanaflex for spastic tetraplegia -1/6  [name] with rehabilitative medicine performed Botox injection into the quadriceps and rectus femoris as well as the biceps femoris and hamstrings, semimembranosus and semitendinosus.  Unable to inject iliopsoas without EMG guidance w/o significant improvement and spasticity hypertonicity.  Unfortunately given lack of improvement he now has bilateral hip flexor contractures -Continue bilateral UE WHO resting splints/PROM q 4 hours/KREG bed -Continue Seroquel  -Continue out of bed to chair with lift  Blister dorsum left foot  -Appreciate wound care nurse assistance, Continue conservative wound care.   Goals of care:  -Palliative care team reconsulted on 1/27 and after an extensive discussion daughter does realize that patient quite debilitated and would not survive a resuscitation therefore patient made a DNR  Chronic respiratory failure with inability to maintain patent airway requiring chronic tracheostomy tube/Recent Corynebacterium tracheobronchitis (resolved) -Continue #4 cuffless trach in place-trach changed out on 1/10 RT -Given altered mentation and inability to manage secretions PCCM states patient is not a candidate for elective decannulation  HIV:  -CD4 count of 124 October 2021 (had been as high as 250 Dec 2020)-as of today up to 259; viral load obtained on 1/15 was <20 -Dc'd Tivicay and Descovy infavor of Biktarvy to for eventual dc to SNF; per my discussion with infectious disease physician [name] on 12/23 preferred regimen would be Descovy and Tivicay-she will contact the HIV pharmacist to attempt to obtain discount cards to make this a more affordable option.  Once this has been confirmed will transition back to Descovy and Tivicay -CD4 count on 12/23 up to 259 -ID recommends continuing prophylactic Bactrim an additional 3 months (last dose 04/24/2020)  Dysphagia 2/2 anoxic brain injury -Continue tube feeding per PEG -Given altered mentation patient not a candidate for  further speech evaluations and will be permanently dependent on enteral tube feedings  Constipation -We will continue scheduled Colace, fiber supplements per tube as well as daily MiraLAX -Suspect requirement for narcotics and multiple muscle relaxers contributing to decreased GI motility and constipation symptoms  Protein calorie malnutrition nutrition Status: Nutrition Problem: Increased nutrient needs Etiology: acute illness Signs/Symptoms: estimated needs Interventions: Tube feeding bolus doses of Osmolite, Juven twice daily, Prosource TF 45 mL 3 times daily Estimated body mass index is 26.15 kg/m as calculated from the following:   Height as of this encounter: 5' 6" (1.676 m).   Weight as of this encounter: 73.5 kg.   Pressure area dorsum left foot not POA Wound / Incision (Open or Dehisced) 11/17/19 Puncture Abdomen Left;Anterior;Upper G-tube insertion site  (Active)  Date First Assessed/Time First Assessed: 11/17/19 1646   Wound Type: Puncture  Location: Abdomen  Location Orientation: Left;Anterior;Upper  Wound Description (Comments): G-tube insertion site   Present on Admission: No    Assessments 11/17/2019  4:47 PM 03/30/2020  9:40 AM  Dressing Type Gauze (Comment);Tape dressing Gauze (Comment)  Dressing Changed New Changed  Dressing Status Clean;Dry;Intact Clean;Dry;Intact  Dressing Change Frequency PRN Daily  Site / Wound Assessment Clean;Dry;Pink Red  Peri-wound Assessment Intact --  Closure None --  Drainage Amount None --  Treatment Cleansed --     No Linked orders to display     Wound / Incision (Open or Dehisced) 03/16/20 Other (Comment) Foot Anterior;Left (Active)  Date  First Assessed/Time First Assessed: 03/16/20 0800   Wound Type: (c) Other (Comment)  Location: Foot  Location Orientation: Anterior;Left  Present on Admission: No    Assessments 03/16/2020  8:00 AM 03/29/2020 10:00 AM  Dressing Type Other (Comment) Foam - Lift dressing to assess site every shift   Dressing Changed Changed New  Dressing Status Old drainage Clean;Dry;Intact  Dressing Change Frequency Daily --  Site / Wound Assessment Pink;Red Pink  Peri-wound Assessment Intact --  Drainage Amount Minimal --  Drainage Description Serosanguineous --  Treatment Cleansed --     No Linked orders to display      Other problems: Hypertension/grade 1 diastolic dysfunction:  -Continue amlodipine  -Echocardiogram this admission with preserved LVEF with evidence of grade   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   -Continue Keppra; levetiracetam level 35.9 on 11/12 -no further seizure activity since transitioned out of ICU setting therefore will discontinue seizure precaution  SIRS/Fever 2/2 recurrent enterococcus UTI -Patient developed mild elevation in temperature overnight to 1/13 associated with slight increase in respiratory secretions and congested sounding cough/abnormal pulmonary exam as well as tachycardia./14 respiratory status much improved and no further fevers. -Respiratory viral panel neg for COVID, influenza and RSV.   -UA slightly abnormal/cx c/w enterococcus UTI- started on nitrofurantoin (prior UTI treated w/ augmentin) has completed 3 days of treatment -X-ray with bilateral opacities concerning for early pneumonia but CT of the chest without pneumonia, broad-spectrum antibiotics dc'd 1/14 -Because of dehydration increased frequency of free water from 200 cc every 4 hours to every 2 hours on 1/13- stopped on 1/15  Fever/peri-PEG drainage positive for corynebacterium and rare Pseudomonas -PEG site with some drainage but no erythema, induration or pain -Suspect may have colonization noting recently had corynebacterium tracheobronchitis treated -continue bacitracin ointment  Low-grade fever/abnormal urinalysis -2/8: Solitary T-max 100.5 overnight.  Likely related to atelectasis.  Obtain chest x-ray today that was unremarkable -As a precaution we will  go ahead and obtain urinalysis and culture and blood cultures; electrolytes unremarkable, LDH normal at 178, no leukocytosis -Urinalysis abnormal with hazy appearance, trace leukocytes, rare bacteria and 11-20 WBCs-urine culture was never obtained and subsequently fever has resolved -Blood cultures are negative -Chest x-ray obtained on 2/14 unremarkable  Inguinal hernias -Evaluated by general surgery this admission. Documented as chronically incarcerated. Patient has been clinically stable and tolerating tube feedings and having bowel movements so unless patient obstructed would not be considered a candidate for repair. Even if obstructed consulting surgeon was not sure he would be a candidate for hernia repair regardless  Constipation -No documented bowel movement since 1/13 -We will continue scheduled Colace, fiber supplements per tube -Add daily MiraLAX -Given one-time dose milk of magnesia on 1/20-no documented bowel movement on flowsheet -Suspect requirement for narcotics and multiple muscle relaxers contributing to decreased GI motility and constipation symptoms  Goals of care:  -Palliative care was involved during this hospitalization.   -Goals of care were discussed. remains full code.  -Palliative care recommended outpatient follow-up with palliative providers. -Reconsult Palliative medicine 1/27   Data Reviewed: Basic Metabolic Panel: Recent Labs  Lab 03/25/20 0830 03/31/20 0144  NA 141 140  K 3.9 4.1  CL 104 101  CO2 25 28  GLUCOSE 101* 90  BUN 18 18  CREATININE 0.64 0.71  CALCIUM 10.5* 10.7*  MG  --  1.9   Liver Function Tests: Recent Labs  Lab 03/25/20 0830 03/31/20 0144  AST 25 21  ALT 19 18  ALKPHOS  88 92  BILITOT 0.4 0.4  PROT 8.0 7.7  ALBUMIN 3.6 3.6   No results for input(s): LIPASE, AMYLASE in the last 168 hours. No results for input(s): AMMONIA in the last 168 hours. CBC: Recent Labs  Lab 03/25/20 0830 03/31/20 0144  WBC 3.7* 4.7   NEUTROABS  --  2.9  HGB 14.3 13.7  HCT 44.8 41.8  MCV 95.1 94.4  PLT 153 142*   Cardiac Enzymes: No results for input(s): CKTOTAL, CKMB, CKMBINDEX, TROPONINI in the last 168 hours. BNP (last 3 results) Recent Labs    02/15/20 0433  BNP 3.9    ProBNP (last 3 results) No results for input(s): PROBNP in the last 8760 hours.  CBG: Recent Labs  Lab 03/29/20 1605 03/29/20 1944 03/30/20 0024 03/30/20 1931 03/30/20 2341  GLUCAP 71 85 95 134* 148*    No results found for this or any previous visit (from the past 240 hour(s)).   Studies: No results found.  Scheduled Meds: . acetaminophen (TYLENOL) oral liquid 160 mg/5 mL  650 mg Per Tube Q6H  . amLODipine  10 mg Per Tube Daily  . bacitracin   Topical BID  . baclofen  30 mg Per Tube TID  . chlorhexidine gluconate (MEDLINE KIT)  15 mL Mouth Rinse BID  . Darunavir-Cobicisctat-Emtricitabine-Tenofovir Alafenamide  1 tablet Oral Q breakfast  . famotidine  10.4 mg Per Tube BID  . feeding supplement (OSMOLITE 1.5 CAL)  237 mL Per Tube Q24H  . feeding supplement (OSMOLITE 1.5 CAL)  474 mL Per Tube TID  . feeding supplement (PROSource TF)  45 mL Per Tube TID  . fiber  1 packet Per Tube BID  . folic acid  1 mg Per Tube Daily  . free water  200 mL Per Tube Q4H  . gabapentin  200 mg Per Tube Q8H  . heparin injection (subcutaneous)  5,000 Units Subcutaneous Q8H  . levETIRAcetam  1,500 mg Per Tube BID  . magic mouthwash w/lidocaine  5 mL Oral QID  . Muscle Rub   Topical QID  . oxyCODONE  5 mg Per Tube Q6H  . polyethylene glycol  17 g Per Tube QHS  . QUEtiapine  12.5 mg Per Tube QHS  . scopolamine  1 patch Transdermal Q72H  . sulfamethoxazole-trimethoprim  1 tablet Per Tube Once per day on Mon Wed Fri  . thiamine  100 mg Per Tube Daily  . tiZANidine  6 mg Per Tube Q6H   Continuous Infusions:   Active Problems:   Cardiac arrest [facility])   Community acquired pneumonia of left upper lobe of lung   Anoxic brain injury ([place])    Alcohol abuse   Acute respiratory failure with hypoxia (HCC)   Vomiting   Pressure injury of skin   Small bowel obstruction (HCC)   Palliative care encounter   Bowel obstruction (HCC)   Chronic respiratory failure (HCC)   Diastolic dysfunction   Tracheostomy dependent (HCC)   Dysphagia   Protein-calorie malnutrition (HCC)   Seizures (HCC)   Bilateral recurrent inguinal hernia   Muscle spasticity   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HCC)   Tracheobronchitis   Sequela of Corynebacterium infection   Abnormal urine   Urinary tract infection due to Enterococcus   Fever   Consultants:  PCCM  Palliative medicine  General surgery  Neurology  Interventional radiology  Procedures:  EEG  Echocardiogram  Tracheostomy  Antibiotics: Anti-infectives (From admission, onward)   Start     Dose/Rate Route Frequency Ordered  Stop   02/17/20 1100  nitrofurantoin (FURADANTIN) 25 MG/5ML suspension 100 mg        100 mg Per Tube Every 12 hours 02/17/20 1006 02/20/20 0924   02/15/20 1000  vancomycin (VANCOCIN) IVPB 1000 mg/200 mL premix  Status:  Discontinued        1,000 mg 200 mL/hr over 60 Minutes Intravenous Every 12 hours 02/14/20 2226 02/16/20 0742   02/14/20 2245  piperacillin-tazobactam (ZOSYN) IVPB 3.375 g  Status:  Discontinued        3.375 g 12.5 mL/hr over 240 Minutes Intravenous Every 8 hours 02/14/20 2226 02/16/20 0742   02/14/20 2245  vancomycin (VANCOREADY) IVPB 1500 mg/300 mL        1,500 mg 150 mL/hr over 120 Minutes Intravenous  Once 02/14/20 2226 02/15/20 0739   02/01/20 2200  amoxicillin-clavulanate (AUGMENTIN) 250-62.5 MG/5ML suspension 500 mg        500 mg Per Tube Every 8 hours 02/01/20 1343 02/06/20 1333   01/31/20 1015  levofloxacin (LEVAQUIN) 25 MG/ML solution 500 mg  Status:  Discontinued        500 mg Per Tube Daily 01/31/20 0919 02/01/20 1343   01/31/20 1000  cefTRIAXone (ROCEPHIN) 1 g in sodium chloride 0.9 % 100 mL IVPB  Status:  Discontinued         1 g 200 mL/hr over 30 Minutes Intravenous Every 24 hours 01/31/20 0905 01/31/20 0919   01/19/20 2200  linezolid (ZYVOX) tablet 600 mg        600 mg Per Tube Every 12 hours 01/19/20 1534 01/25/20 2019   01/19/20 1100  levofloxacin (LEVAQUIN) tablet 500 mg  Status:  Discontinued        500 mg Per Tube Daily 01/19/20 1004 01/19/20 1534   12/19/19 0930  Darunavir-Cobicisctat-Emtricitabine-Tenofovir Alafenamide (SYMTUZA) 800-150-200-10 MG TABS 1 tablet        1 tablet Oral Daily with breakfast 12/19/19 0840     12/13/19 1000  bictegravir-emtricitabine-tenofovir AF (BIKTARVY) 50-200-25 MG per tablet 1 tablet  Status:  Discontinued        1 tablet Oral Daily 12/12/19 1413 12/19/19 0840   11/17/19 1548  ceFAZolin (ANCEF) 2-4 GM/100ML-% IVPB       Note to Pharmacy: Domenick Bookbinder   : cabinet override      11/17/19 1548 11/18/19 0359   11/16/19 1515  ceFAZolin (ANCEF) IVPB 2g/100 mL premix        2 g 200 mL/hr over 30 Minutes Intravenous To Radiology 11/16/19 1511 11/17/19 1625   11/15/19 0900  sulfamethoxazole-trimethoprim (BACTRIM DS) 800-160 MG per tablet 1 tablet        1 tablet Per Tube Once per day on Mon Wed Fri 11/13/19 1906     11/13/19 1615  dolutegravir (TIVICAY) tablet 50 mg  Status:  Discontinued        50 mg Oral Daily 11/13/19 1521 12/12/19 1413   11/13/19 1615  emtricitabine-tenofovir AF (DESCOVY) 200-25 MG per tablet 1 tablet  Status:  Discontinued        1 tablet Per Tube Daily 11/13/19 1521 12/12/19 1413   11/13/19 1530  sulfamethoxazole-trimethoprim (BACTRIM DS) 800-160 MG per tablet 1 tablet  Status:  Discontinued        1 tablet Oral Once per day on Mon Wed Fri 11/13/19 1441 11/13/19 1906   11/13/19 1500  bictegravir-emtricitabine-tenofovir AF (BIKTARVY) 50-200-25 MG per tablet 1 tablet  Status:  Discontinued        1 tablet Oral Daily 11/13/19 1403  11/13/19 1521   11/10/19 1000  erythromycin 250 mg in sodium chloride 0.9 % 100 mL IVPB        250 mg 100 mL/hr over 60  Minutes Intravenous Every 8 hours 11/10/19 0907 11/11/19 2000   11/07/19 1200  ceFEPIme (MAXIPIME) 2 g in sodium chloride 0.9 % 100 mL IVPB        2 g 200 mL/hr over 30 Minutes Intravenous Every 8 hours 11/07/19 1013 11/13/19 2127   11/03/19 1200  cefTRIAXone (ROCEPHIN) 2 g in sodium chloride 0.9 % 100 mL IVPB        2 g 200 mL/hr over 30 Minutes Intravenous Every 24 hours 11/03/19 0922 11/05/19 1427   11/02/19 0800  vancomycin (VANCOREADY) IVPB 750 mg/150 mL  Status:  Discontinued        750 mg 150 mL/hr over 60 Minutes Intravenous Every 12 hours 11/01/19 1935 11/02/19 1105   11/02/19 0600  piperacillin-tazobactam (ZOSYN) IVPB 3.375 g  Status:  Discontinued        3.375 g 12.5 mL/hr over 240 Minutes Intravenous Every 8 hours 11/02/19 0311 11/03/19 0920   11/01/19 2200  ceFEPIme (MAXIPIME) 2 g in sodium chloride 0.9 % 100 mL IVPB  Status:  Discontinued        2 g 200 mL/hr over 30 Minutes Intravenous Every 12 hours 11/01/19 2143 11/02/19 0311   11/01/19 1915  vancomycin (VANCOREADY) IVPB 1500 mg/300 mL        1,500 mg 150 mL/hr over 120 Minutes Intravenous  Once 11/01/19 1909 11/01/19 2147   11/01/19 1845  cefTRIAXone (ROCEPHIN) 1 g in sodium chloride 0.9 % 100 mL IVPB        1 g 200 mL/hr over 30 Minutes Intravenous  Once 11/01/19 1842 11/01/19 2051   11/01/19 1845  azithromycin (ZITHROMAX) 500 mg in sodium chloride 0.9 % 250 mL IVPB        500 mg 250 mL/hr over 60 Minutes Intravenous  Once 11/01/19 1842 11/01/19 2051      Signature  Lala Lund M.D on 03/31/2020 at 8:47 AM   -  To page go to [email or web address]     Time 15 mins

## 2020-04-01 LAB — GLUCOSE, CAPILLARY
Glucose-Capillary: 89 mg/dL (ref 70–99)
Glucose-Capillary: 143 mg/dL — ABNORMAL HIGH (ref 70–99)
Glucose-Capillary: 84 mg/dL (ref 70–99)
Glucose-Capillary: 84 mg/dL (ref 70–99)
Glucose-Capillary: 81 mg/dL (ref 70–99)
Glucose-Capillary: 94 mg/dL (ref 70–99)
Glucose-Capillary: 73 mg/dL (ref 70–99)
Glucose-Capillary: 106 mg/dL — ABNORMAL HIGH (ref 70–99)
Glucose-Capillary: 145 mg/dL — ABNORMAL HIGH (ref 70–99)
Glucose-Capillary: 167 mg/dL — ABNORMAL HIGH (ref 70–99)
Glucose-Capillary: 84 mg/dL (ref 70–99)
Glucose-Capillary: 87 mg/dL (ref 70–99)
Glucose-Capillary: 158 mg/dL — ABNORMAL HIGH (ref 70–99)
Glucose-Capillary: 140 mg/dL — ABNORMAL HIGH (ref 70–99)
Glucose-Capillary: 82 mg/dL (ref 70–99)
Glucose-Capillary: 217 mg/dL — ABNORMAL HIGH (ref 70–99)

## 2020-04-01 MED ORDER — OSMOLITE 1.5 CAL PO LIQD
474.0000 mL | Freq: Four times a day (QID) | ORAL | Status: DC
  Administered 2020-04-01 – 2020-04-08 (×26): 474 mL
  Filled 2020-04-01 (×28): qty 474

## 2020-04-01 MED ORDER — FREE WATER
150.0000 mL | Freq: Three times a day (TID) | Status: DC
  Administered 2020-04-01 – 2020-04-08 (×20): 150 mL

## 2020-04-01 MED ORDER — PROSOURCE TF PO LIQD
45.0000 mL | Freq: Every day | ORAL | Status: DC
  Administered 2020-04-02 – 2020-04-07 (×6): 45 mL
  Filled 2020-04-01 (×6): qty 45

## 2020-04-01 NOTE — Progress Notes (Signed)
CSW spoke with Geanie Berlin of [facility] - Tammy reports there were new COVID positive patient's at the [place] site so admissions are still on hold until at least Wednesday. Tammy requesting new clinicals be sent on Wednesday.  Edwin Dada, MSW, LCSW Transitions of Care  Clinical Social Worker II [phone]

## 2020-04-01 NOTE — Progress Notes (Signed)
Nutrition Follow-up  DOCUMENTATION CODES:   Non-severe (moderate) malnutrition in context of chronic illness  INTERVENTION:  Continue bolus TF via PEG: -Provide 2 cartons (445m) Osmolite 1.5 QID @ 0800, 1200, 1600, 2000 -Flush with 634mfree water before and after each tube feeding bolus -Provide 4513mrosource TF daily -Provide Nutrisource fiber BID -Additional free water flushes: 150m91mH  Tube feeding regimen provides 2880 kcals, 130 grams protein,1448ml69me water  (2858ml 4m flushes) Meets 100% of needs  NUTRITION DIAGNOSIS:   Moderate Malnutrition related to chronic illness as evidenced by mild fat depletion,mild muscle depletion. -- updated  GOAL:   Patient will meet greater than or equal to 90% of their needs -- met with TF  MONITOR:   Labs,Weight trends,TF tolerance,Skin,I & O's  REASON FOR ASSESSMENT:   Ventilator,Consult Enteral/tube feeding initiation and management  ASSESSMENT:   Pt admitted with anoxic brain injury 2/2 PEA arrest/pain syndrome 2/2 hypertonicity/spastic tetraplegia (presumed to be 2/2 combination of EtOH and BZD overdose with UDS positive for THC and BZDs). PMH includes HIV and EtOH use.  09/29 - intubated 10/07- vomiting, TF held, laterrestarted with recurrent vomiting 10/12-R/L inguinal hernia, partial SBO  10/13- trach 10/15-PEG 10/16 - tolerated TC for 9 hours 10/18->10/19 - off vent overnight 10/20- TC 11/15 - trach downsized to #4 Shiley flex CFS 12/06 - trach changed (same #4 Shiley flex CFS) 1/11 - trach changed (same #4 Shiley flex CFS) 2/7 - trach changed (same #4 Shiley flex CFS) 2/14 - CXR unremarkable  Pt awake and alert, though remains nonverbal. Pt making attempts to follow commands.   Pt medically stable for discharge per MD; placement continues to be the barrier.    Pt remains NPO and is tolerating TF via PEG per RN. Current TF regimen is as follows: 2 cartons (474ml) 35mlite 1.5 TID, 1 carton  (237ml) O64mite 1.5 once daily, 60ml fre19mter flush before and after each tube feeding bolus + additional FWF of 200ml Q4H,50ml Proso83m TF TID and Nutrisource fiber BID. Continue with current regimen.   UOP: 2400ml x 24 h58m  Admission weight: 78.9 kg Lowest weight: 70.5 kg (? Accuracy) Current weight: 73.5 kg  Note pt with varying degrees of edema throughout admission which have likely been contributing to weight changes; though none noted lately per RN assessment.   Medications: pepcid, folic acid, miralax, thiamine Labs from 2/27: Corrected Calcium 11.02 CBGs: 84-167  RD completed updated NFPE today as none had been completed since admission. Pt meets criteria for moderate malnutrition in context of chronic illness. Pt cachectic. RD will increase kcals provided by TF given new diagnosis of moderate malnutrition.   Flowsheet Row Most Recent Value  Orbital Region Mild depletion  Upper Arm Region Moderate depletion  Thoracic and Lumbar Region Mild depletion  Buccal Region Mild depletion  Temple Region Mild depletion  Clavicle Bone Region Mild depletion  Clavicle and Acromion Bone Region Moderate depletion  Scapular Bone Region Mild depletion  Dorsal Hand Moderate depletion  Patellar Region Moderate depletion  Anterior Thigh Region Moderate depletion  Posterior Calf Region Moderate depletion  Edema (RD Assessment) None  Hair Reviewed  Eyes Reviewed  Mouth Reviewed  Skin Reviewed  Nails Reviewed      Diet Order:   Diet Order    None      EDUCATION NEEDS:   Not appropriate for education at this time  Skin:  Skin Assessment: Skin Integrity Issues: Skin Integrity Issues:: Other (Comment) Stage II: N/A Stage III: N/A  Other: puncture abdomen (PEG insertion site)  Last BM:  2/25  Height:   Ht Readings from Last 1 Encounters:  11/01/19 _0  (1.676 m)    Weight:   Wt Readings from Last 1 Encounters:  03/30/20 73.5 kg    BMI:  Body mass index is 26.15  kg/m.  Estimated Nutritional Needs:   Kcal:  1030-1314  Protein:  120-135 grams  Fluid:  >2L/d  Larkin Ina, MS, RD, LDN RD pager number and weekend/on-call pager number located in [place].

## 2020-04-01 NOTE — Plan of Care (Signed)
  Problem: Nutrition: Goal: Adequate nutrition will be maintained Outcome: Progressing   Problem: Pain Managment: Goal: General experience of comfort will improve Outcome: Progressing   Problem: Clinical Measurements: Goal: Will remain free from infection Outcome: Not Progressing Goal: Diagnostic test results will improve Outcome: Not Progressing Goal: Respiratory complications will improve Outcome: Not Progressing Goal: Cardiovascular complication will be avoided Outcome: Not Progressing   Problem: Activity: Goal: Risk for activity intolerance will decrease Outcome: Not Progressing   Problem: Coping: Goal: Level of anxiety will decrease Outcome: Not Progressing   Problem: Elimination: Goal: Will not experience complications related to bowel motility Outcome: Not Progressing Goal: Will not experience complications related to urinary retention Outcome: Not Progressing   Problem: Safety: Goal: Ability to remain free from injury will improve Outcome: Not Progressing   Problem: Skin Integrity: Goal: Risk for impaired skin integrity will decrease Outcome: Not Progressing   Problem: Education: Goal: 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 Outcome: Not Progressing   Problem: Activity: Goal: Ability to tolerate increased activity will improve Outcome: Not Progressing   Problem: Health Behavior/Discharge Planning: Goal: 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 Outcome: Not Progressing   Problem: Respiratory: Goal: Patent airway maintenance will improve Outcome: Not Progressing   Problem: Role Relationship: Goal: Ability to communicate will improve Outcome: Not Progressing   Problem: Education: Goal: Knowledge of the prescribed therapeutic regimen Outcome: Not Progressing Goal: Knowledge of disease or condition will improve Outcome: Not Progressing   Problem: Clinical Measurements: Goal: Neurologic status will improve Outcome: Not  Progressing   Problem: Tissue Perfusion: Goal: Ability to maintain intracranial pressure will improve Outcome: Not Progressing   Problem: Respiratory: Goal: Will regain and/or maintain adequate ventilation Outcome: Not Progressing   Problem: Skin Integrity: Goal: Risk for impaired skin integrity will decrease Outcome: Not Progressing Goal: Demonstration of wound healing without infection will improve Outcome: Not Progressing   Problem: Psychosocial: Goal: Ability to verbalize positive feelings about self will improve Outcome: Not Progressing Goal: Ability to participate in self-care as condition permits will improve Outcome: Not Progressing Goal: Ability to identify appropriate support needs will improve Outcome: Not Progressing   Problem: Health Behavior/Discharge Planning: Goal: Ability to manage health-related needs will improve Outcome: Not Progressing   Problem: Nutritional: Goal: Risk of aspiration will decrease Outcome: Not Progressing Goal: Dietary intake will improve Outcome: Not Progressing   Problem: Communication: Goal: Ability to communicate needs accurately will improve Outcome: Not Progressing

## 2020-04-01 NOTE — Progress Notes (Signed)
[facility] PROGRESS NOTE  Ryan Orozco MVH:[id number] DOB: [birth year] DOA: 11/01/2019 PCP: Default, Provider, MD       12/19/19                          03/15/20 OOB to chair   Status: Remains inpatient appropriate because:Altered mental status, Unsafe d/c plan and Inpatient level of care appropriate due to severity of illness. Patient newly diagnosed with anoxic brain injury  Dispo: The patient is from: Home              Anticipated d/c is to: SNF              Anticipated d/c date is: > 3 days              Patient currently is medically stable to d/c.  Barriers to discharge: No SNF bed offers.  Needs trach capable facility.  Still awaiting approval of Medicaid and disability.  No bed offers. Facility to review today 2/18   Code Status: DNR after d/w palliative team on 1/27 Family Communication: 2/21 updated dtr Ryan Orozco -she flew to [facility] 2/21 DVT prophylaxis: Subcutaneous heparin Vaccination status: Will order first dose Pfizer COVID-vaccine on 1/10; next week we will order pneumococcal and influenza vaccinations-2nd dose ordered 277  Foley catheter:  No  HPI: 55 year old male with HIV, chronic alcohol abuse who was presented to the emergency department after being found unresponsive outside a convinient store.  EMS found him pulseless in PEA, unknown downtime, successfully resuscitated and brought to the emergency department.  UDS positive for THC and benzodiazepines.  CT head unremarkable.  Failed extubation trials due to severe anoxic brain injury and had tracheostomy placed.  PEG placed on 10/15.  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  This has led to significant pain as well.  [name] was consulted and recommendations/input/orders highly appreciated.  She may also benefit from Botox injections to both hips to aid in treatment of spasticity.   Significant Hospital Events   9/29  Admit post PEA arrest. UDS positive for THC, benzo's. ETOH 177 10/05 EEG ongoing. Versed restarted overnight due to agitation. Nicardipine increased.  10/06 Developed tachypnea with WUA, no follow commands off sedation  10/07 Vomiting, TF held / restarted with recurrent vomiting 10/16 tracheostomy collar for 9 hours 10/18->10/19 off vent all night  10/20> TC 11/15 > downsized to #4 Shiley flex CFS  Subjective: Awake and alert.  Attempting to follow commands.  Video made to send to patient's daughter.  Objective: Vitals:   04/01/20 0423 04/01/20 0519  BP:  116/63  Pulse: 79 84  Resp: 18 20  Temp:  98.4 F (36.9 C)  SpO2: 93% 97%    Intake/Output Summary (Last 24 hours) at 04/01/2020 0737 Last data filed at 04/01/2020 0409 Gross per 24 hour  Intake 100 ml  Output 2400 ml  Net -2300 ml   Filed Weights   03/24/20 0500 03/25/20 0500 03/30/20 0340  Weight: 73.5 kg 73.5 kg 73.5 kg    Exam: General: Awake alert, calm, no acute distress Pulmonary: Lungs are clear anteriorly.  FiO2 21% via humidified trach collar.  No tracheal secretions Cardiac: Heart sounds are normal, pulses regular nontachycardic, no peripheral edema Abdomen: Abdomen soft and nontender.  PEG tube for feedings.  LBM 2/25 Neurological: Severe hypertonicity/spasticity bilateral lower extremities involving the hip flexors and hamstrings;  Keeps bilateral hips flexed secondary to hip  flexor contractures essentially nonpurposeful use of upper extremities at times it does appear as if he is motioning as directionally in regards to repositioning.   Psychiatric: Awake and alert.  Nonverbal Skin: Wound on dorsum of left foot covered with dressing.  No periwound erythema.  Assessment/Plan: Acute problems: Anoxic brain injury 2/2 PEA arrest/pain syndrome secondary to hypertonicity/spastic tetraplegia:  -Presumed 2/2 combination alcohol and BZD overdose (UDS + for THC and BZDs) -Significant brain injury/spastic  tetraplegia with patient now completely bedbound -Continue baclofen, gabapentin, scheduled oxycodone and Zanaflex for spastic tetraplegia; continue Seroquel -1/6 [name] with rehabilitative medicine performed Botox injection into the quadriceps and rectus femoris as well as the biceps femoris and hamstrings, semimembranosus and semitendinosus.  Unable to inject iliopsoas without EMG guidance w/o significant improvement and spasticity hypertonicity.  Unfortunately given lack of improvement he now has bilateral hip flexor contractures -Continue bilateral UE WHO resting splints/PROM q 4 hours/KREG bed -Continue out of bed to chair with lift  Blister dorsum left foot  03/15/20 -Appreciate wound care nurse assistance.   -Continue conservative wound care  Goals of care:  -Palliative care team reconsulted on 1/27 and after an extensive discussion daughter does realize that patient quite debilitated and would not survive a resuscitation therefore patient made a DNR  Chronic respiratory failure with inability to maintain patent airway requiring chronic tracheostomy tube/Recent Corynebacterium tracheobronchitis (resolved) -Continue #4 cuffless trach in place-trach changed out on 1/10 RT -Given altered mentation and inability to manage secretions PCCM states patient is not a candidate for elective decannulation  HIV:  -CD4 count of 124 October 2021 (had been as high as 250 Dec 2020)-as of today up to 259; viral load obtained on 1/15 was <20 -Dc'd Tivicay and Descovy infavor of Biktarvy to for eventual dc to SNF; per my discussion with infectious disease physician [name] on 12/23 preferred regimen would be Descovy and Tivicay-she will contact the HIV pharmacist to attempt to obtain discount cards to make this a more affordable option.  Once this has been confirmed will transition back to Descovy and Tivicay -CD4 count on 12/23 up to 259 -ID recommends continuing prophylactic Bactrim an additional 3  months (last dose 04/24/2020)  Dysphagia 2/2 anoxic brain injury -Continue tube feeding per PEG -Given altered mentation patient not a candidate for further speech evaluations and will be permanently dependent on enteral tube feedings  Constipation -We will continue scheduled Colace, fiber supplements per tube as well as daily MiraLAX -Suspect requirement for narcotics and multiple muscle relaxers contributing to decreased GI motility and constipation symptoms  Protein calorie malnutrition nutrition Status: Nutrition Problem: Increased nutrient needs Etiology: acute illness Signs/Symptoms: estimated needs Interventions: Tube feeding bolus doses of Osmolite, Juven twice daily, Prosource TF 45 mL 3 times daily Estimated body mass index is 26.15 kg/m as calculated from the following:   Height as of this encounter: 5' 6"  (1.676 m).   Weight as of this encounter: 73.5 kg.   Pressure area dorsum left foot not POA Wound / Incision (Open or Dehisced) 11/17/19 Puncture Abdomen Left;Anterior;Upper G-tube insertion site  (Active)  Date First Assessed/Time First Assessed: 11/17/19 1646   Wound Type: Puncture  Location: Abdomen  Location Orientation: Left;Anterior;Upper  Wound Description (Comments): G-tube insertion site   Present on Admission: No    Assessments 11/17/2019  4:47 PM 03/30/2020  9:40 AM  Dressing Type Gauze (Comment);Tape dressing Gauze (Comment)  Dressing Changed New Changed  Dressing Status Clean;Dry;Intact Clean;Dry;Intact  Dressing Change Frequency PRN Daily  Site / Wound Assessment Clean;Dry;Pink Red  Peri-wound Assessment Intact --  Closure None --  Drainage Amount None --  Treatment Cleansed --     No Linked orders to display     Wound / Incision (Open or Dehisced) 03/16/20 Other (Comment) Foot Anterior;Left (Active)  Date First Assessed/Time First Assessed: 03/16/20 0800   Wound Type: (c) Other (Comment)  Location: Foot  Location Orientation: Anterior;Left  Present on  Admission: No    Assessments 03/16/2020  8:00 AM 03/29/2020 10:00 AM  Dressing Type Other (Comment) Foam - Lift dressing to assess site every shift  Dressing Changed Changed New  Dressing Status Old drainage Clean;Dry;Intact  Dressing Change Frequency Daily --  Site / Wound Assessment Pink;Red Pink  Peri-wound Assessment Intact --  Drainage Amount Minimal --  Drainage Description Serosanguineous --  Treatment Cleansed --     No Linked orders to display      Other problems: Hypertension/grade 1 diastolic dysfunction:  -Continue amlodipine  -Echocardiogram this admission with preserved LVEF with evidence of grade   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   -Continue Keppra; levetiracetam level 35.9 on 11/12 -no further seizure activity since transitioned out of ICU setting therefore will discontinue seizure precaution  SIRS/Fever 2/2 recurrent enterococcus UTI -Patient developed mild elevation in temperature overnight to 1/13 associated with slight increase in respiratory secretions and congested sounding cough/abnormal pulmonary exam as well as tachycardia./14 respiratory status much improved and no further fevers. -Respiratory viral panel neg for COVID, influenza and RSV.   -UA slightly abnormal/cx c/w enterococcus UTI- started on nitrofurantoin (prior UTI treated w/ augmentin) has completed 3 days of treatment -X-ray with bilateral opacities concerning for early pneumonia but CT of the chest without pneumonia, broad-spectrum antibiotics dc'd 1/14 -Because of dehydration increased frequency of free water from 200 cc every 4 hours to every 2 hours on 1/13- stopped on 1/15  Fever/peri-PEG drainage positive for corynebacterium and rare Pseudomonas -PEG site with some drainage but no erythema, induration or pain -Suspect may have colonization noting recently had corynebacterium tracheobronchitis treated -continue bacitracin ointment  Low-grade fever/abnormal  urinalysis -2/8: Solitary T-max 100.5 overnight.  Likely related to atelectasis.  Obtain chest x-ray today that was unremarkable -As a precaution we will go ahead and obtain urinalysis and culture and blood cultures; electrolytes unremarkable, LDH normal at 178, no leukocytosis -Urinalysis abnormal with hazy appearance, trace leukocytes, rare bacteria and 11-20 WBCs-urine culture was never obtained and subsequently fever has resolved -Blood cultures are negative -Chest x-ray obtained on 2/14 unremarkable  Inguinal hernias -Evaluated by general surgery this admission. Documented as chronically incarcerated. Patient has been clinically stable and tolerating tube feedings and having bowel movements so unless patient obstructed would not be considered a candidate for repair. Even if obstructed consulting surgeon was not sure he would be a candidate for hernia repair regardless  Constipation -No documented bowel movement since 1/13 -We will continue scheduled Colace, fiber supplements per tube -Add daily MiraLAX -Given one-time dose milk of magnesia on 1/20-no documented bowel movement on flowsheet -Suspect requirement for narcotics and multiple muscle relaxers contributing to decreased GI motility and constipation symptoms  Goals of care:  -Palliative care was involved during this hospitalization.   -Goals of care were discussed. remains full code.  -Palliative care recommended outpatient follow-up with palliative providers. -Reconsult Palliative medicine 1/27   Data Reviewed: Basic Metabolic Panel: Recent Labs  Lab 03/25/20 0830 03/31/20 0144  NA 141 140  K 3.9 4.1  CL  104 101  CO2 25 28  GLUCOSE 101* 90  BUN 18 18  CREATININE 0.64 0.71  CALCIUM 10.5* 10.7*  MG  --  1.9   Liver Function Tests: Recent Labs  Lab 03/25/20 0830 03/31/20 0144  AST 25 21  ALT 19 18  ALKPHOS 88 92  BILITOT 0.4 0.4  PROT 8.0 7.7  ALBUMIN 3.6 3.6   No results for input(s): LIPASE, AMYLASE in  the last 168 hours. No results for input(s): AMMONIA in the last 168 hours. CBC: Recent Labs  Lab 03/25/20 0830 03/31/20 0144  WBC 3.7* 4.7  NEUTROABS  --  2.9  HGB 14.3 13.7  HCT 44.8 41.8  MCV 95.1 94.4  PLT 153 142*   Cardiac Enzymes: No results for input(s): CKTOTAL, CKMB, CKMBINDEX, TROPONINI in the last 168 hours. BNP (last 3 results) Recent Labs    02/15/20 0433  BNP 3.9    ProBNP (last 3 results) No results for input(s): PROBNP in the last 8760 hours.  CBG: Recent Labs  Lab 03/29/20 1944 03/30/20 0024 03/30/20 1931 03/30/20 2341 04/01/20 0733  GLUCAP 85 95 134* 148* 87    No results found for this or any previous visit (from the past 240 hour(s)).   Studies: No results found.  Scheduled Meds: . acetaminophen (TYLENOL) oral liquid 160 mg/5 mL  650 mg Per Tube Q6H  . amLODipine  10 mg Per Tube Daily  . bacitracin   Topical BID  . baclofen  30 mg Per Tube TID  . chlorhexidine gluconate (MEDLINE KIT)  15 mL Mouth Rinse BID  . Darunavir-Cobicisctat-Emtricitabine-Tenofovir Alafenamide  1 tablet Oral Q breakfast  . famotidine  10.4 mg Per Tube BID  . feeding supplement (OSMOLITE 1.5 CAL)  237 mL Per Tube Q24H  . feeding supplement (OSMOLITE 1.5 CAL)  474 mL Per Tube TID  . feeding supplement (PROSource TF)  45 mL Per Tube TID  . fiber  1 packet Per Tube BID  . folic acid  1 mg Per Tube Daily  . free water  200 mL Per Tube Q4H  . gabapentin  200 mg Per Tube Q8H  . heparin injection (subcutaneous)  5,000 Units Subcutaneous Q8H  . levETIRAcetam  1,500 mg Per Tube BID  . magic mouthwash w/lidocaine  5 mL Oral QID  . Muscle Rub   Topical QID  . oxyCODONE  5 mg Per Tube Q6H  . polyethylene glycol  17 g Per Tube QHS  . QUEtiapine  12.5 mg Per Tube QHS  . scopolamine  1 patch Transdermal Q72H  . sulfamethoxazole-trimethoprim  1 tablet Per Tube Once per day on Mon Wed Fri  . thiamine  100 mg Per Tube Daily  . tiZANidine  6 mg Per Tube Q6H   Continuous  Infusions:   Active Problems:   Cardiac arrest [facility])   Community acquired pneumonia of left upper lobe of lung   Anoxic brain injury ([place])   Alcohol abuse   Acute respiratory failure with hypoxia (HCC)   Vomiting   Pressure injury of skin   Small bowel obstruction (HCC)   Palliative care encounter   Bowel obstruction (HCC)   Chronic respiratory failure (HCC)   Diastolic dysfunction   Tracheostomy dependent (HCC)   Dysphagia   Protein-calorie malnutrition (HCC)   Seizures (HCC)   Bilateral recurrent inguinal hernia   Muscle spasticity   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HCC)   Tracheobronchitis   Sequela of Corynebacterium infection   Abnormal urine  Urinary tract infection due to Enterococcus   Fever   Consultants:  PCCM  Palliative medicine  General surgery  Neurology  Interventional radiology  Procedures:  EEG  Echocardiogram  Tracheostomy  Antibiotics: Anti-infectives (From admission, onward)   Start     Dose/Rate Route Frequency Ordered Stop   02/17/20 1100  nitrofurantoin (FURADANTIN) 25 MG/5ML suspension 100 mg        100 mg Per Tube Every 12 hours 02/17/20 1006 02/20/20 0924   02/15/20 1000  vancomycin (VANCOCIN) IVPB 1000 mg/200 mL premix  Status:  Discontinued        1,000 mg 200 mL/hr over 60 Minutes Intravenous Every 12 hours 02/14/20 2226 02/16/20 0742   02/14/20 2245  piperacillin-tazobactam (ZOSYN) IVPB 3.375 g  Status:  Discontinued        3.375 g 12.5 mL/hr over 240 Minutes Intravenous Every 8 hours 02/14/20 2226 02/16/20 0742   02/14/20 2245  vancomycin (VANCOREADY) IVPB 1500 mg/300 mL        1,500 mg 150 mL/hr over 120 Minutes Intravenous  Once 02/14/20 2226 02/15/20 0739   02/01/20 2200  amoxicillin-clavulanate (AUGMENTIN) 250-62.5 MG/5ML suspension 500 mg        500 mg Per Tube Every 8 hours 02/01/20 1343 02/06/20 1333   01/31/20 1015  levofloxacin (LEVAQUIN) 25 MG/ML solution 500 mg  Status:  Discontinued        500 mg  Per Tube Daily 01/31/20 0919 02/01/20 1343   01/31/20 1000  cefTRIAXone (ROCEPHIN) 1 g in sodium chloride 0.9 % 100 mL IVPB  Status:  Discontinued        1 g 200 mL/hr over 30 Minutes Intravenous Every 24 hours 01/31/20 0905 01/31/20 0919   01/19/20 2200  linezolid (ZYVOX) tablet 600 mg        600 mg Per Tube Every 12 hours 01/19/20 1534 01/25/20 2019   01/19/20 1100  levofloxacin (LEVAQUIN) tablet 500 mg  Status:  Discontinued        500 mg Per Tube Daily 01/19/20 1004 01/19/20 1534   12/19/19 0930  Darunavir-Cobicisctat-Emtricitabine-Tenofovir Alafenamide (SYMTUZA) 800-150-200-10 MG TABS 1 tablet        1 tablet Oral Daily with breakfast 12/19/19 0840     12/13/19 1000  bictegravir-emtricitabine-tenofovir AF (BIKTARVY) 50-200-25 MG per tablet 1 tablet  Status:  Discontinued        1 tablet Oral Daily 12/12/19 1413 12/19/19 0840   11/17/19 1548  ceFAZolin (ANCEF) 2-4 GM/100ML-% IVPB       Note to Pharmacy: Domenick Bookbinder   : cabinet override      11/17/19 1548 11/18/19 0359   11/16/19 1515  ceFAZolin (ANCEF) IVPB 2g/100 mL premix        2 g 200 mL/hr over 30 Minutes Intravenous To Radiology 11/16/19 1511 11/17/19 1625   11/15/19 0900  sulfamethoxazole-trimethoprim (BACTRIM DS) 800-160 MG per tablet 1 tablet        1 tablet Per Tube Once per day on Mon Wed Fri 11/13/19 1906     11/13/19 1615  dolutegravir (TIVICAY) tablet 50 mg  Status:  Discontinued        50 mg Oral Daily 11/13/19 1521 12/12/19 1413   11/13/19 1615  emtricitabine-tenofovir AF (DESCOVY) 200-25 MG per tablet 1 tablet  Status:  Discontinued        1 tablet Per Tube Daily 11/13/19 1521 12/12/19 1413   11/13/19 1530  sulfamethoxazole-trimethoprim (BACTRIM DS) 800-160 MG per tablet 1 tablet  Status:  Discontinued  1 tablet Oral Once per day on Mon Wed Fri 11/13/19 1441 11/13/19 1906   11/13/19 1500  bictegravir-emtricitabine-tenofovir AF (BIKTARVY) 50-200-25 MG per tablet 1 tablet  Status:  Discontinued        1 tablet  Oral Daily 11/13/19 1403 11/13/19 1521   11/10/19 1000  erythromycin 250 mg in sodium chloride 0.9 % 100 mL IVPB        250 mg 100 mL/hr over 60 Minutes Intravenous Every 8 hours 11/10/19 0907 11/11/19 2000   11/07/19 1200  ceFEPIme (MAXIPIME) 2 g in sodium chloride 0.9 % 100 mL IVPB        2 g 200 mL/hr over 30 Minutes Intravenous Every 8 hours 11/07/19 1013 11/13/19 2127   11/03/19 1200  cefTRIAXone (ROCEPHIN) 2 g in sodium chloride 0.9 % 100 mL IVPB        2 g 200 mL/hr over 30 Minutes Intravenous Every 24 hours 11/03/19 0922 11/05/19 1427   11/02/19 0800  vancomycin (VANCOREADY) IVPB 750 mg/150 mL  Status:  Discontinued        750 mg 150 mL/hr over 60 Minutes Intravenous Every 12 hours 11/01/19 1935 11/02/19 1105   11/02/19 0600  piperacillin-tazobactam (ZOSYN) IVPB 3.375 g  Status:  Discontinued        3.375 g 12.5 mL/hr over 240 Minutes Intravenous Every 8 hours 11/02/19 0311 11/03/19 0920   11/01/19 2200  ceFEPIme (MAXIPIME) 2 g in sodium chloride 0.9 % 100 mL IVPB  Status:  Discontinued        2 g 200 mL/hr over 30 Minutes Intravenous Every 12 hours 11/01/19 2143 11/02/19 0311   11/01/19 1915  vancomycin (VANCOREADY) IVPB 1500 mg/300 mL        1,500 mg 150 mL/hr over 120 Minutes Intravenous  Once 11/01/19 1909 11/01/19 2147   11/01/19 1845  cefTRIAXone (ROCEPHIN) 1 g in sodium chloride 0.9 % 100 mL IVPB        1 g 200 mL/hr over 30 Minutes Intravenous  Once 11/01/19 1842 11/01/19 2051   11/01/19 1845  azithromycin (ZITHROMAX) 500 mg in sodium chloride 0.9 % 250 mL IVPB        500 mg 250 mL/hr over 60 Minutes Intravenous  Once 11/01/19 1842 11/01/19 2051       Time spent: 20 minutes    Erin Hearing ANP  [facility]  7 am-330 pm/M-F for direct patient care and secure chat Please refer to [place] for contact information 04/01/2020, 7:37 AM  LOS: 152 days

## 2020-04-02 DIAGNOSIS — E440 Moderate protein-calorie malnutrition: Secondary | ICD-10-CM | POA: Insufficient documentation

## 2020-04-02 LAB — GLUCOSE, CAPILLARY
Glucose-Capillary: 70 mg/dL (ref 70–99)
Glucose-Capillary: 73 mg/dL (ref 70–99)
Glucose-Capillary: 107 mg/dL — ABNORMAL HIGH (ref 70–99)
Glucose-Capillary: 105 mg/dL — ABNORMAL HIGH (ref 70–99)
Glucose-Capillary: 162 mg/dL — ABNORMAL HIGH (ref 70–99)
Glucose-Capillary: 127 mg/dL — ABNORMAL HIGH (ref 70–99)

## 2020-04-02 NOTE — Plan of Care (Signed)
  Problem: Activity: Goal: Risk for activity intolerance will decrease Outcome: Progressing   Problem: Nutrition: Goal: Adequate nutrition will be maintained Outcome: Progressing   Problem: Skin Integrity: Goal: Risk for impaired skin integrity will decrease Outcome: Progressing   Problem: Clinical Measurements: Goal: Will remain free from infection Outcome: Not Progressing Goal: Diagnostic test results will improve Outcome: Not Progressing Goal: Respiratory complications will improve Outcome: Not Progressing Goal: Cardiovascular complication will be avoided Outcome: Not Progressing   Problem: Coping: Goal: Level of anxiety will decrease Outcome: Not Progressing   Problem: Elimination: Goal: Will not experience complications related to bowel motility Outcome: Not Progressing Goal: Will not experience complications related to urinary retention Outcome: Not Progressing   Problem: Pain Managment: Goal: General experience of comfort will improve Outcome: Not Progressing   Problem: Safety: Goal: Ability to remain free from injury will improve Outcome: Not Progressing   Problem: Education: Goal: 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 Outcome: Not Progressing   Problem: Activity: Goal: Ability to tolerate increased activity will improve Outcome: Not Progressing   Problem: Health Behavior/Discharge Planning: Goal: 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 Outcome: Not Progressing   Problem: Respiratory: Goal: Patent airway maintenance will improve Outcome: Not Progressing   Problem: Role Relationship: Goal: Ability to communicate will improve Outcome: Not Progressing   Problem: Education: Goal: Knowledge of the prescribed therapeutic regimen Outcome: Not Progressing Goal: Knowledge of disease or condition will improve Outcome: Not Progressing   Problem: Clinical Measurements: Goal: Neurologic status will improve Outcome: Not  Progressing   Problem: Tissue Perfusion: Goal: Ability to maintain intracranial pressure will improve Outcome: Not Progressing   Problem: Respiratory: Goal: Will regain and/or maintain adequate ventilation Outcome: Not Progressing   Problem: Skin Integrity: Goal: Risk for impaired skin integrity will decrease Outcome: Not Progressing Goal: Demonstration of wound healing without infection will improve Outcome: Not Progressing   Problem: Psychosocial: Goal: Ability to verbalize positive feelings about self will improve Outcome: Not Progressing Goal: Ability to participate in self-care as condition permits will improve Outcome: Not Progressing Goal: Ability to identify appropriate support needs will improve Outcome: Not Progressing   Problem: Health Behavior/Discharge Planning: Goal: Ability to manage health-related needs will improve Outcome: Not Progressing   Problem: Nutritional: Goal: Risk of aspiration will decrease Outcome: Not Progressing Goal: Dietary intake will improve Outcome: Not Progressing   Problem: Communication: Goal: Ability to communicate needs accurately will improve Outcome: Not Progressing

## 2020-04-02 NOTE — Progress Notes (Signed)
[facility] PROGRESS NOTE  Ryan California OBS:[id number] DOB: October 02, 1965 DOA: 11/01/2019 PCP: Default, Provider, MD       12/19/19                          03/15/20 OOB to chair   Status: Remains inpatient appropriate because:Altered mental status, Unsafe d/c plan and Inpatient level of care appropriate due to severity of illness. Patient newly diagnosed with anoxic brain injury  Dispo: The patient is from: Home              Anticipated d/c is to: SNF              Anticipated d/c date is: > 3 days              Patient currently is medically stable to d/c.  Barriers to discharge: No SNF bed offers.  Needs trach capable facility.  Still awaiting approval of Medicaid and disability.  No bed offers. Facility to review today 2/18   Code Status: DNR after d/w palliative team on 1/27 Family Communication: 3/1 updated dtr [name] via text  DVT prophylaxis: Subcutaneous heparin Vaccination status: Will order first dose Pfizer COVID-vaccine on 1/10; next week we will order pneumococcal and influenza vaccinations-2nd dose ordered 277  Foley catheter:  No  HPI: 55 year old male with HIV, chronic alcohol abuse who was presented to the emergency department after being found unresponsive outside a convinient store.  EMS found him pulseless in PEA, unknown downtime, successfully resuscitated and brought to the emergency department.  UDS positive for THC and benzodiazepines.  CT head unremarkable.  Failed extubation trials due to severe anoxic brain injury and had tracheostomy placed.  PEG placed on 10/15.  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  This has led to significant pain as well.  [name] was consulted and recommendations/input/orders highly appreciated.  She may also benefit from Botox injections to both hips to aid in treatment of spasticity.   Significant Hospital Events   9/29 Admit post PEA  arrest. UDS positive for THC, benzo's. ETOH 177 10/05 EEG ongoing. Versed restarted overnight due to agitation. Nicardipine increased.  10/06 Developed tachypnea with WUA, no follow commands off sedation  10/07 Vomiting, TF held / restarted with recurrent vomiting 10/16 tracheostomy collar for 9 hours 10/18->10/19 off vent all night  10/20> TC 11/15 > downsized to #4 Shiley flex CFS  Subjective: Awake.  Somewhat restless today and not smiling per his usual.  Objective: Vitals:   04/02/20 0233 04/02/20 0303  BP: 103/76   Pulse: 75 80  Resp: 20 18  Temp: 97.9 F (36.6 C)   SpO2: 97% 97%    Intake/Output Summary (Last 24 hours) at 04/02/2020 0743 Last data filed at 04/02/2020 0236 Gross per 24 hour  Intake --  Output 1350 ml  Net -1350 ml   Filed Weights   03/24/20 0500 03/25/20 0500 03/30/20 0340  Weight: 73.5 kg 73.5 kg 73.5 kg    Exam: General: Awake and restless but does not appear to be in physical distress Pulmonary: Clear.  FiO2 21% via humidified trach collar.  No secretions.  Wet sounding cough Cardiac: Normal heart sounds, no tachycardia, no peripheral edema Abdomen:   PEG tube for feedings.  LBM 2/25 Neurological: Severe hypertonicity/spasticity bilateral lower extremities involving the hip flexors and hamstrings;  Keeps bilateral hips flexed secondary to hip flexor contractures essentially nonpurposeful use of upper  extremities at times it does appear as if he is motioning as directionally in regards to repositioning.   Psychiatric:  Skin: Wound on dorsum of left foot improving and decreased in size.  No periwound erythema.  Fingernails of both hands trimmed to prevent accidental self injury.  Assessment/Plan: Acute problems: Anoxic brain injury 2/2 PEA arrest/pain syndrome secondary to hypertonicity/spastic tetraplegia:  -Presumed 2/2 combination alcohol and BZD overdose (UDS + for THC and BZDs) -Significant brain injury/spastic tetraplegia with patient now  completely bedbound -Continue baclofen, gabapentin, scheduled oxycodone and Zanaflex for spastic tetraplegia; continue Seroquel -1/6 [name] with rehabilitative medicine performed Botox injection into the quadriceps and rectus femoris as well as the biceps femoris and hamstrings, semimembranosus and semitendinosus.  Unable to inject iliopsoas without EMG guidance w/o significant improvement and spasticity hypertonicity.  Unfortunately given lack of improvement he now has bilateral hip flexor contractures -Continue bilateral UE WHO resting splints/PROM q 4 hours/KREG bed -Continue out of bed to chair with lift  Blister dorsum left foot  03/15/20 -Appreciate wound care nurse assistance.   -Continue conservative wound care  Goals of care:  -Palliative care team reconsulted on 1/27 and after an extensive discussion daughter does realize that patient quite debilitated and would not survive a resuscitation therefore patient made a DNR  Chronic respiratory failure with inability to maintain patent airway requiring chronic tracheostomy tube/Recent Corynebacterium tracheobronchitis (resolved) -Continue #4 cuffless trach in place-trach changed out on 1/10 RT -Given altered mentation and inability to manage secretions PCCM states patient is not a candidate for elective decannulation  HIV:  -CD4 count of 124 October 2021 (had been as high as 250 Dec 2020)-as of today up to 259; viral load obtained on 1/15 was <20 -Dc'd Tivicay and Descovy infavor of Biktarvy to for eventual dc to SNF; per my discussion with infectious disease physician [name] on 12/23 preferred regimen would be Descovy and Tivicay-she will contact the HIV pharmacist to attempt to obtain discount cards to make this a more affordable option.  Once this has been confirmed will transition back to Descovy and Tivicay -CD4 count on 12/23 up to 259 -ID recommends continuing prophylactic Bactrim an additional 3 months (last dose  04/24/2020)  Dysphagia 2/2 anoxic brain injury -Continue tube feeding per PEG -Given altered mentation patient not a candidate for further speech evaluations and will be permanently dependent on enteral tube feedings  Constipation -We will continue scheduled Colace, fiber supplements per tube as well as daily MiraLAX -Suspect requirement for narcotics and multiple muscle relaxers contributing to decreased GI motility and constipation symptoms  Moderate Protein calorie malnutrition nutrition Status: Nutrition Problem: Moderate Malnutrition Etiology: chronic illness Signs/Symptoms: mild fat depletion,mild muscle depletion Interventions: Tube feeding bolus doses of Osmolite 1.5 2 cartons, Juven twice daily, Prosource TF 45 mL 3 times daily and Nutrisouce fiber BID and free water 150 cc q 8 hrs Estimated body mass index is 26.15 kg/m as calculated from the following:   Height as of this encounter: 5' 6" (1.676 m).   Weight as of this encounter: 73.5 kg.   Pressure area dorsum left foot not POA Wound / Incision (Open or Dehisced) 11/17/19 Puncture Abdomen Left;Anterior;Upper G-tube insertion site  (Active)  Date First Assessed/Time First Assessed: 11/17/19 1646   Wound Type: Puncture  Location: Abdomen  Location Orientation: Left;Anterior;Upper  Wound Description (Comments): G-tube insertion site   Present on Admission: No    Assessments 11/17/2019  4:47 PM 04/01/2020  7:40 PM  Dressing Type Gauze (Comment);Tape  dressing Gauze (Comment)  Dressing Changed New --  Dressing Status Clean;Dry;Intact Clean;Dry  Dressing Change Frequency PRN Every 3 days  Site / Wound Assessment Clean;Dry;Pink Clean;Dry;Bleeding  Peri-wound Assessment Intact --  Closure None --  Drainage Amount None --  Treatment Cleansed --     No Linked orders to display     Wound / Incision (Open or Dehisced) 03/16/20 Other (Comment) Foot Anterior;Left (Active)  Date First Assessed/Time First Assessed: 03/16/20 0800    Wound Type: (c) Other (Comment)  Location: Foot  Location Orientation: Anterior;Left  Present on Admission: No    Assessments 03/16/2020  8:00 AM 03/29/2020 10:00 AM  Dressing Type Other (Comment) Foam - Lift dressing to assess site every shift  Dressing Changed Changed New  Dressing Status Old drainage Clean;Dry;Intact  Dressing Change Frequency Daily --  Site / Wound Assessment Pink;Red Pink  Peri-wound Assessment Intact --  Drainage Amount Minimal --  Drainage Description Serosanguineous --  Treatment Cleansed --     No Linked orders to display      Other problems: Hypertension/grade 1 diastolic dysfunction:  -Continue amlodipine  -Echocardiogram this admission with preserved LVEF with evidence of grade   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   -Continue Keppra; levetiracetam level 35.9 on 11/12 -no further seizure activity since transitioned out of ICU setting therefore will discontinue seizure precaution  SIRS/Fever 2/2 recurrent enterococcus UTI -Patient developed mild elevation in temperature overnight to 1/13 associated with slight increase in respiratory secretions and congested sounding cough/abnormal pulmonary exam as well as tachycardia./14 respiratory status much improved and no further fevers. -Respiratory viral panel neg for COVID, influenza and RSV.   -UA slightly abnormal/cx c/w enterococcus UTI- started on nitrofurantoin (prior UTI treated w/ augmentin) has completed 3 days of treatment -X-ray with bilateral opacities concerning for early pneumonia but CT of the chest without pneumonia, broad-spectrum antibiotics dc'd 1/14 -Because of dehydration increased frequency of free water from 200 cc every 4 hours to every 2 hours on 1/13- stopped on 1/15  Fever/peri-PEG drainage positive for corynebacterium and rare Pseudomonas -PEG site with some drainage but no erythema, induration or pain -Suspect may have colonization noting recently had  corynebacterium tracheobronchitis treated -continue bacitracin ointment  Low-grade fever/abnormal urinalysis -2/8: Solitary T-max 100.5 overnight.  Likely related to atelectasis.  Obtain chest x-ray today that was unremarkable -As a precaution we will go ahead and obtain urinalysis and culture and blood cultures; electrolytes unremarkable, LDH normal at 178, no leukocytosis -Urinalysis abnormal with hazy appearance, trace leukocytes, rare bacteria and 11-20 WBCs-urine culture was never obtained and subsequently fever has resolved -Blood cultures are negative -Chest x-ray obtained on 2/14 unremarkable  Inguinal hernias -Evaluated by general surgery this admission. Documented as chronically incarcerated. Patient has been clinically stable and tolerating tube feedings and having bowel movements so unless patient obstructed would not be considered a candidate for repair. Even if obstructed consulting surgeon was not sure he would be a candidate for hernia repair regardless  Constipation -No documented bowel movement since 1/13 -We will continue scheduled Colace, fiber supplements per tube -Add daily MiraLAX -Given one-time dose milk of magnesia on 1/20-no documented bowel movement on flowsheet -Suspect requirement for narcotics and multiple muscle relaxers contributing to decreased GI motility and constipation symptoms  Goals of care:  -Palliative care was involved during this hospitalization.   -Goals of care were discussed. remains full code.  -Palliative care recommended outpatient follow-up with palliative providers. -Reconsult Palliative medicine 1/27   Data  Reviewed: Basic Metabolic Panel: Recent Labs  Lab 03/31/20 0144  NA 140  K 4.1  CL 101  CO2 28  GLUCOSE 90  BUN 18  CREATININE 0.71  CALCIUM 10.7*  MG 1.9   Liver Function Tests: Recent Labs  Lab 03/31/20 0144  AST 21  ALT 18  ALKPHOS 92  BILITOT 0.4  PROT 7.7  ALBUMIN 3.6   No results for input(s): LIPASE,  AMYLASE in the last 168 hours. No results for input(s): AMMONIA in the last 168 hours. CBC: Recent Labs  Lab 03/31/20 0144  WBC 4.7  NEUTROABS 2.9  HGB 13.7  HCT 41.8  MCV 94.4  PLT 142*   Cardiac Enzymes: No results for input(s): CKTOTAL, CKMB, CKMBINDEX, TROPONINI in the last 168 hours. BNP (last 3 results) Recent Labs    02/15/20 0433  BNP 3.9    ProBNP (last 3 results) No results for input(s): PROBNP in the last 8760 hours.  CBG: Recent Labs  Lab 04/01/20 0733 04/01/20 1103 04/01/20 1620 04/01/20 1951 04/01/20 2228  GLUCAP 87 158* 140* 82 217*    No results found for this or any previous visit (from the past 240 hour(s)).   Studies: No results found.  Scheduled Meds: . acetaminophen (TYLENOL) oral liquid 160 mg/5 mL  650 mg Per Tube Q6H  . amLODipine  10 mg Per Tube Daily  . bacitracin   Topical BID  . baclofen  30 mg Per Tube TID  . chlorhexidine gluconate (MEDLINE KIT)  15 mL Mouth Rinse BID  . Darunavir-Cobicisctat-Emtricitabine-Tenofovir Alafenamide  1 tablet Oral Q breakfast  . famotidine  10.4 mg Per Tube BID  . feeding supplement (OSMOLITE 1.5 CAL)  474 mL Per Tube QID  . feeding supplement (PROSource TF)  45 mL Per Tube Daily  . fiber  1 packet Per Tube BID  . folic acid  1 mg Per Tube Daily  . free water  150 mL Per Tube Q8H  . gabapentin  200 mg Per Tube Q8H  . heparin injection (subcutaneous)  5,000 Units Subcutaneous Q8H  . levETIRAcetam  1,500 mg Per Tube BID  . magic mouthwash w/lidocaine  5 mL Oral QID  . Muscle Rub   Topical QID  . oxyCODONE  5 mg Per Tube Q6H  . polyethylene glycol  17 g Per Tube QHS  . QUEtiapine  12.5 mg Per Tube QHS  . scopolamine  1 patch Transdermal Q72H  . sulfamethoxazole-trimethoprim  1 tablet Per Tube Once per day on Mon Wed Fri  . thiamine  100 mg Per Tube Daily  . tiZANidine  6 mg Per Tube Q6H   Continuous Infusions:   Active Problems:   Cardiac arrest [facility])   Community acquired pneumonia of left  upper lobe of lung   Anoxic brain injury ([place])   Alcohol abuse   Acute respiratory failure with hypoxia ([facility])   Vomiting   Pressure injury of skin   Small bowel obstruction ([facility]CC)   Palliative care encounter   Bowel obstruction (HCC)   Chronic respiratory failure (HCC)   Diastolic dysfunction   Tracheostomy dependent (HCC)   Dysphagia   Protein-calorie malnutrition (HCC)   Seizures (HCC)   Bilateral recurrent inguinal hernia   Muscle spasticity   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HCC)   Tracheobronchitis   Sequela of Corynebacterium infection   Abnormal urine   Urinary tract infection due to Enterococcus   Fever   Malnutrition of moderate degree   Consultants:  PCCM  Palliative medicine  General surgery  Neurology  Interventional radiology  Procedures:  EEG  Echocardiogram  Tracheostomy  Antibiotics: Anti-infectives (From admission, onward)   Start     Dose/Rate Route Frequency Ordered Stop   02/17/20 1100  nitrofurantoin (FURADANTIN) 25 MG/5ML suspension 100 mg        100 mg Per Tube Every 12 hours 02/17/20 1006 02/20/20 0924   02/15/20 1000  vancomycin (VANCOCIN) IVPB 1000 mg/200 mL premix  Status:  Discontinued        1,000 mg 200 mL/hr over 60 Minutes Intravenous Every 12 hours 02/14/20 2226 02/16/20 0742   02/14/20 2245  piperacillin-tazobactam (ZOSYN) IVPB 3.375 g  Status:  Discontinued        3.375 g 12.5 mL/hr over 240 Minutes Intravenous Every 8 hours 02/14/20 2226 02/16/20 0742   02/14/20 2245  vancomycin (VANCOREADY) IVPB 1500 mg/300 mL        1,500 mg 150 mL/hr over 120 Minutes Intravenous  Once 02/14/20 2226 02/15/20 0739   02/01/20 2200  amoxicillin-clavulanate (AUGMENTIN) 250-62.5 MG/5ML suspension 500 mg        500 mg Per Tube Every 8 hours 02/01/20 1343 02/06/20 1333   01/31/20 1015  levofloxacin (LEVAQUIN) 25 MG/ML solution 500 mg  Status:  Discontinued        500 mg Per Tube Daily 01/31/20 0919 02/01/20 1343   01/31/20 1000   cefTRIAXone (ROCEPHIN) 1 g in sodium chloride 0.9 % 100 mL IVPB  Status:  Discontinued        1 g 200 mL/hr over 30 Minutes Intravenous Every 24 hours 01/31/20 0905 01/31/20 0919   01/19/20 2200  linezolid (ZYVOX) tablet 600 mg        600 mg Per Tube Every 12 hours 01/19/20 1534 01/25/20 2019   01/19/20 1100  levofloxacin (LEVAQUIN) tablet 500 mg  Status:  Discontinued        500 mg Per Tube Daily 01/19/20 1004 01/19/20 1534   12/19/19 0930  Darunavir-Cobicisctat-Emtricitabine-Tenofovir Alafenamide (SYMTUZA) 800-150-200-10 MG TABS 1 tablet        1 tablet Oral Daily with breakfast 12/19/19 0840     12/13/19 1000  bictegravir-emtricitabine-tenofovir AF (BIKTARVY) 50-200-25 MG per tablet 1 tablet  Status:  Discontinued        1 tablet Oral Daily 12/12/19 1413 12/19/19 0840   11/17/19 1548  ceFAZolin (ANCEF) 2-4 GM/100ML-% IVPB       Note to Pharmacy: Domenick Bookbinder   : cabinet override      11/17/19 1548 11/18/19 0359   11/16/19 1515  ceFAZolin (ANCEF) IVPB 2g/100 mL premix        2 g 200 mL/hr over 30 Minutes Intravenous To Radiology 11/16/19 1511 11/17/19 1625   11/15/19 0900  sulfamethoxazole-trimethoprim (BACTRIM DS) 800-160 MG per tablet 1 tablet        1 tablet Per Tube Once per day on Mon Wed Fri 11/13/19 1906     11/13/19 1615  dolutegravir (TIVICAY) tablet 50 mg  Status:  Discontinued        50 mg Oral Daily 11/13/19 1521 12/12/19 1413   11/13/19 1615  emtricitabine-tenofovir AF (DESCOVY) 200-25 MG per tablet 1 tablet  Status:  Discontinued        1 tablet Per Tube Daily 11/13/19 1521 12/12/19 1413   11/13/19 1530  sulfamethoxazole-trimethoprim (BACTRIM DS) 800-160 MG per tablet 1 tablet  Status:  Discontinued        1 tablet Oral Once per day on Mon Wed Fri  11/13/19 1441 11/13/19 1906   11/13/19 1500  bictegravir-emtricitabine-tenofovir AF (BIKTARVY) 50-200-25 MG per tablet 1 tablet  Status:  Discontinued        1 tablet Oral Daily 11/13/19 1403 11/13/19 1521   11/10/19 1000   erythromycin 250 mg in sodium chloride 0.9 % 100 mL IVPB        250 mg 100 mL/hr over 60 Minutes Intravenous Every 8 hours 11/10/19 0907 11/11/19 2000   11/07/19 1200  ceFEPIme (MAXIPIME) 2 g in sodium chloride 0.9 % 100 mL IVPB        2 g 200 mL/hr over 30 Minutes Intravenous Every 8 hours 11/07/19 1013 11/13/19 2127   11/03/19 1200  cefTRIAXone (ROCEPHIN) 2 g in sodium chloride 0.9 % 100 mL IVPB        2 g 200 mL/hr over 30 Minutes Intravenous Every 24 hours 11/03/19 0922 11/05/19 1427   11/02/19 0800  vancomycin (VANCOREADY) IVPB 750 mg/150 mL  Status:  Discontinued        750 mg 150 mL/hr over 60 Minutes Intravenous Every 12 hours 11/01/19 1935 11/02/19 1105   11/02/19 0600  piperacillin-tazobactam (ZOSYN) IVPB 3.375 g  Status:  Discontinued        3.375 g 12.5 mL/hr over 240 Minutes Intravenous Every 8 hours 11/02/19 0311 11/03/19 0920   11/01/19 2200  ceFEPIme (MAXIPIME) 2 g in sodium chloride 0.9 % 100 mL IVPB  Status:  Discontinued        2 g 200 mL/hr over 30 Minutes Intravenous Every 12 hours 11/01/19 2143 11/02/19 0311   11/01/19 1915  vancomycin (VANCOREADY) IVPB 1500 mg/300 mL        1,500 mg 150 mL/hr over 120 Minutes Intravenous  Once 11/01/19 1909 11/01/19 2147   11/01/19 1845  cefTRIAXone (ROCEPHIN) 1 g in sodium chloride 0.9 % 100 mL IVPB        1 g 200 mL/hr over 30 Minutes Intravenous  Once 11/01/19 1842 11/01/19 2051   11/01/19 1845  azithromycin (ZITHROMAX) 500 mg in sodium chloride 0.9 % 250 mL IVPB        500 mg 250 mL/hr over 60 Minutes Intravenous  Once 11/01/19 1842 11/01/19 2051       Time spent: 20 minutes    Erin Hearing ANP  [facility]  7 am-330 pm/M-F for direct patient care and secure chat Please refer to [place] for contact information 04/02/2020, 7:43 AM  LOS: 153 days

## 2020-04-02 NOTE — TOC Progression Note (Addendum)
Transition of Care [facility]) - Progression Note    Patient Details  Name: Ryan Orozco MRN: [id number] Date of Birth: 07-19-1965  Transition of Care [facility]) CM/SW Contact  Janae Bridgeman, RN Phone Number: 04/02/2020, 11:19 AM  Clinical Narrative:    CM reached out a left a message with Revonda Standard, CM with [facility] to see if they have bed availability for this patient Long Term Care placement at a facility able to meet this patients care needs.  Will continue to reach out to other SNF facilities in the area while waiting on [place] SNF facility to open beds for admission.  04/02/2020 1159 - Spoke with Mariel Craft [phone] at [facility] and they are unable to make a bed offer to the patient at this time until the patient's Medicaid insurance has been approved.  The facility is able to provide care for provide care for patient's with trach and PEG but are unable to accept an LOG from [facility].  Once the patient's Medicaid is established the facility will be able to consider the patient for admission.    CM and MSW will continue to explore SNFs in the area for admission opportunities for this patient.   Expected Discharge Plan: Skilled Nursing Facility Barriers to Discharge: Continued Medical Work up,SNF Pending bed offer  Expected Discharge Plan and Services Expected Discharge Plan: Skilled Nursing Facility   Discharge Planning Services: CM Consult Post Acute Care Choice: Nursing Home Living arrangements for the past 2 months: Single Family Home                                       Social Determinants of Health (SDOH) Interventions    Readmission Risk Interventions Readmission Risk Prevention Plan 02/06/2020  Transportation Screening Complete  PCP or Specialist Appt within 3-5 Days Complete  HRI or Home Care Consult Complete  Social Work Consult for Recovery Care Planning/Counseling Complete  Palliative Care  Screening Complete  Medication Review Oceanographer) Complete  Some recent data might be hidden

## 2020-04-03 LAB — GLUCOSE, CAPILLARY
Glucose-Capillary: 117 mg/dL — ABNORMAL HIGH (ref 70–99)
Glucose-Capillary: 133 mg/dL — ABNORMAL HIGH (ref 70–99)
Glucose-Capillary: 136 mg/dL — ABNORMAL HIGH (ref 70–99)
Glucose-Capillary: 141 mg/dL — ABNORMAL HIGH (ref 70–99)
Glucose-Capillary: 162 mg/dL — ABNORMAL HIGH (ref 70–99)
Glucose-Capillary: 85 mg/dL (ref 70–99)
Glucose-Capillary: 97 mg/dL (ref 70–99)

## 2020-04-03 NOTE — Progress Notes (Addendum)
[facility] PROGRESS NOTE  Ryan Orozco NWG:[id number] DOB: 14-Sep-1965 DOA: 11/01/2019 PCP: Default, Provider, MD       12/19/19                          03/15/20 OOB to chair   Status: Remains inpatient appropriate because:Altered mental status, Unsafe d/c plan and Inpatient level of care appropriate due to severity of illness. Patient newly diagnosed with anoxic brain injury  Dispo: The patient is from: Home              Anticipated d/c is to: SNF              Anticipated d/c date is: > 3 days              Patient currently is medically stable to d/c.  Barriers to discharge: No SNF bed offers.  Needs trach capable facility.  Still awaiting approval of Medicaid and disability.  No bed offers. Facility to review today 2/18   Code Status: DNR after d/w palliative team on 1/27 Family Communication: 3/1 updated dtr Ryan via text  DVT prophylaxis: Subcutaneous heparin Vaccination status: Will order first dose Pfizer COVID-vaccine on 1/10; next week we will order pneumococcal and influenza vaccinations-2nd dose ordered 277  Foley catheter:  No  HPI: 55 year old male with HIV, chronic alcohol abuse who was presented to the emergency department after being found unresponsive outside a convinient store.  EMS found him pulseless in PEA, unknown downtime, successfully resuscitated and brought to the emergency department.  UDS positive for THC and benzodiazepines.  CT head unremarkable.  Failed extubation trials due to severe anoxic brain injury and had tracheostomy placed.  PEG placed on 10/15.  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  This has led to significant pain as well.  Dr. Franchot Ryan Orozco was consulted and recommendations/input/orders highly appreciated.  She may also benefit from Botox injections to both hips to aid in treatment of spasticity.   Significant Hospital Events   9/29 Admit post PEA  arrest. UDS positive for THC, benzo's. ETOH 177 10/05 EEG ongoing. Versed restarted overnight due to agitation. Nicardipine increased.  10/06 Developed tachypnea with WUA, no follow commands off sedation  10/07 Vomiting, TF held / restarted with recurrent vomiting 10/16 tracheostomy collar for 9 hours 10/18->10/19 off vent all night  10/20> TC 11/15 > downsized to #4 Shiley flex CFS  Subjective: Awake and very interactive.  Smiling especially when subject of his daughter Ryan Orozco brought up.  Discussed with his nurse and plan is to get patient out of bed to chair.  Reassured nurse that it is okay to roll patient to nursing station to improve socialization.  Objective: Vitals:   04/03/20 0430 04/03/20 0631  BP:  112/80  Pulse: 79 77  Resp: 18 19  Temp:  98.5 F (36.9 C)  SpO2: 98% 98%    Intake/Output Summary (Last 24 hours) at 04/03/2020 0750 Last data filed at 04/02/2020 2137 Gross per 24 hour  Intake 0 ml  Output 990 ml  Net -990 ml   Filed Weights   03/24/20 0500 03/25/20 0500 03/30/20 0340  Weight: 73.5 kg 73.5 kg 73.5 kg    Exam: General: Awake, alert and in no acute distress Pulmonary: Anterior lung sounds clear to auscultation FiO2 21% via humidified trach collar. No increased work of breathing and no tracheal secretions. Cardiac: Heart sounds S1-S2, pulses regular no peripheral  edema. Abdomen: Soft and nontender.  Normoactive bowel sounds.  PEG tube for LBM 2/25 Neurological: Severe hypertonicity/spasticity bilateral lower extremities involving the hip flexors and hamstrings;  Keeps bilateral hips flexed secondary to hip flexor contractures essentially nonpurposeful use of upper extremities at times it does appear as if he is motioning as directionally in regards to repositioning.   Psychiatric: Alert and pleasant, appears to be oriented to name.  Nonverbal so otherwise unable to accurately assess orientation Skin: Wound on dorsum of left foot improving and decreased in  size.  No periwound erythema.  Fingernails of both hands trimmed to prevent accidental self injury on 3/1.  Assessment/Plan: Acute problems: Anoxic brain injury 2/2 PEA arrest/pain syndrome secondary to hypertonicity/spastic tetraplegia:  -Presumed 2/2 combination alcohol and BZD overdose (UDS + for THC and BZDs) -Significant brain injury/spastic tetraplegia with patient now completely bedbound -Continue baclofen, gabapentin, scheduled oxycodone and Zanaflex for spastic tetraplegia; continue Seroquel -1/6 Dr. Naaman Orozco with rehabilitative medicine performed Botox injection into the quadriceps and rectus femoris as well as the biceps femoris and hamstrings, semimembranosus and semitendinosus.  Unable to inject iliopsoas without EMG guidance w/o significant improvement and spasticity hypertonicity.  Unfortunately given lack of improvement he now has bilateral hip flexor contractures -Continue bilateral UE WHO resting splints/PROM q 4 hours/KREG bed -Continue out of bed to chair with lift  Blister dorsum left foot  03/15/20 -Appreciate wound care nurse assistance.   -Continue conservative wound care  Goals of care:  -Palliative care team reconsulted on 1/27 and after an extensive discussion daughter does realize that patient quite debilitated and would not survive a resuscitation therefore patient made a DNR  Chronic respiratory failure with inability to maintain patent airway requiring chronic tracheostomy tube/Recent Corynebacterium tracheobronchitis (resolved) -Continue #4 cuffless trach in place-trach changed out on 1/10 RT -Given altered mentation and inability to manage secretions PCCM states patient is not a candidate for elective decannulation -3/2 patient has been alert without significant tracheal secretions for several months.  Trach team has reevaluated.  Will resume capping trials for the next 48 hours and if stable plan is to decannulate on Friday 3/4  HIV:  -CD4 count of 124  October 2021 (had been as high as 250 Dec 2020)-as of today up to 259; viral load obtained on 1/15 was <20 -Dc'd Tivicay and Descovy infavor of Biktarvy to for eventual dc to SNF; per my discussion with infectious disease physician [name] on 12/23 preferred regimen would be Descovy and Tivicay-she will contact the HIV pharmacist to attempt to obtain discount cards to make this a more affordable option.  Once this has been confirmed will transition back to Descovy and Tivicay -CD4 count on 12/23 up to 259 -ID recommends continuing prophylactic Bactrim an additional 3 months (last dose 04/24/2020)  Dysphagia 2/2 anoxic brain injury -Continue tube feeding per PEG -Given altered mentation patient not a candidate for further speech evaluations and will be permanently dependent on enteral tube feedings  Constipation -We will continue scheduled Colace, fiber supplements per tube as well as daily MiraLAX -Suspect requirement for narcotics and multiple muscle relaxers contributing to decreased GI motility and constipation symptoms  Moderate Protein calorie malnutrition nutrition Status: Nutrition Problem: Moderate Malnutrition Etiology: chronic illness Signs/Symptoms: mild fat depletion,mild muscle depletion Interventions: Tube feeding bolus doses of Osmolite 1.5 2 cartons, Juven twice daily, Prosource TF 45 mL 3 times daily and Nutrisouce fiber BID and free water 150 cc q 8 hrs Estimated body mass index is 26.15 kg/m as  calculated from the following:   Height as of this encounter: _0  (1.676 m).   Weight as of this encounter: 73.5 kg.   Pressure area dorsum left foot not POA Wound / Incision (Open or Dehisced) 11/17/19 Puncture Abdomen Left;Anterior;Upper G-tube insertion site  (Active)  Date First Assessed/Time First Assessed: 11/17/19 1646   Wound Type: Puncture  Location: Abdomen  Location Orientation: Left;Anterior;Upper  Wound Description (Comments): G-tube insertion site   Present on  Admission: No    Assessments 11/17/2019  4:47 PM 04/01/2020  7:40 PM  Dressing Type Gauze (Comment);Tape dressing Gauze (Comment)  Dressing Changed New --  Dressing Status Clean;Dry;Intact Clean;Dry  Dressing Change Frequency PRN Every 3 days  Site / Wound Assessment Clean;Dry;Pink Clean;Dry;Bleeding  Peri-wound Assessment Intact --  Closure None --  Drainage Amount None --  Treatment Cleansed --     No Linked orders to display     Wound / Incision (Open or Dehisced) 03/16/20 Other (Comment) Foot Anterior;Left (Active)  Date First Assessed/Time First Assessed: 03/16/20 0800   Wound Type: (c) Other (Comment)  Location: Foot  Location Orientation: Anterior;Left  Present on Admission: No    Assessments 03/16/2020  8:00 AM 03/29/2020 10:00 AM  Dressing Type Other (Comment) Foam - Lift dressing to assess site every shift  Dressing Changed Changed New  Dressing Status Old drainage Clean;Dry;Intact  Dressing Change Frequency Daily --  Site / Wound Assessment Pink;Red Pink  Peri-wound Assessment Intact --  Drainage Amount Minimal --  Drainage Description Serosanguineous --  Treatment Cleansed --     No Linked orders to display      Other problems: Hypertension/grade 1 diastolic dysfunction:  -Continue amlodipine  -Echocardiogram this admission with preserved LVEF with evidence of grade   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   -Continue Keppra; levetiracetam level 35.9 on 11/12 -no further seizure activity since transitioned out of ICU setting therefore will discontinue seizure precaution  SIRS/Fever 2/2 recurrent enterococcus UTI -Patient developed mild elevation in temperature overnight to 1/13 associated with slight increase in respiratory secretions and congested sounding cough/abnormal pulmonary exam as well as tachycardia./14 respiratory status much improved and no further fevers. -Respiratory viral panel neg for COVID, influenza and RSV.   -UA slightly  abnormal/cx c/w enterococcus UTI- started on nitrofurantoin (prior UTI treated w/ augmentin) has completed 3 days of treatment -X-ray with bilateral opacities concerning for early pneumonia but CT of the chest without pneumonia, broad-spectrum antibiotics dc'd 1/14 -Because of dehydration increased frequency of free water from 200 cc every 4 hours to every 2 hours on 1/13- stopped on 1/15  Fever/peri-PEG drainage positive for corynebacterium and rare Pseudomonas -PEG site with some drainage but no erythema, induration or pain -Suspect may have colonization noting recently had corynebacterium tracheobronchitis treated -continue bacitracin ointment  Low-grade fever/abnormal urinalysis -2/8: Solitary T-max 100.5 overnight.  Likely related to atelectasis.  Obtain chest x-ray today that was unremarkable -As a precaution we will go ahead and obtain urinalysis and culture and blood cultures; electrolytes unremarkable, LDH normal at 178, no leukocytosis -Urinalysis abnormal with hazy appearance, trace leukocytes, rare bacteria and 11-20 WBCs-urine culture was never obtained and subsequently fever has resolved -Blood cultures are negative -Chest x-ray obtained on 2/14 unremarkable  Inguinal hernias -Evaluated by general surgery this admission. Documented as chronically incarcerated. Patient has been clinically stable and tolerating tube feedings and having bowel movements so unless patient obstructed would not be considered a candidate for repair. Even if obstructed consulting surgeon was  not sure he would be a candidate for hernia repair regardless  Constipation -No documented bowel movement since 1/13 -We will continue scheduled Colace, fiber supplements per tube -Add daily MiraLAX -Given one-time dose milk of magnesia on 1/20-no documented bowel movement on flowsheet -Suspect requirement for narcotics and multiple muscle relaxers contributing to decreased GI motility and constipation  symptoms  Goals of care:  -Palliative care was involved during this hospitalization.   -Goals of care were discussed. remains full code.  -Palliative care recommended outpatient follow-up with palliative providers. -Reconsult Palliative medicine 1/27   Data Reviewed: Basic Metabolic Panel: Recent Labs  Lab 03/31/20 0144  NA 140  K 4.1  CL 101  CO2 28  GLUCOSE 90  BUN 18  CREATININE 0.71  CALCIUM 10.7*  MG 1.9   Liver Function Tests: Recent Labs  Lab 03/31/20 0144  AST 21  ALT 18  ALKPHOS 92  BILITOT 0.4  PROT 7.7  ALBUMIN 3.6   No results for input(s): LIPASE, AMYLASE in the last 168 hours. No results for input(s): AMMONIA in the last 168 hours. CBC: Recent Labs  Lab 03/31/20 0144  WBC 4.7  NEUTROABS 2.9  HGB 13.7  HCT 41.8  MCV 94.4  PLT 142*   Cardiac Enzymes: No results for input(s): CKTOTAL, CKMB, CKMBINDEX, TROPONINI in the last 168 hours. BNP (last 3 results) Recent Labs    02/15/20 0433  BNP 3.9    ProBNP (last 3 results) No results for input(s): PROBNP in the last 8760 hours.  CBG: Recent Labs  Lab 04/02/20 0751 04/02/20 1125 04/02/20 1640 04/02/20 2122 04/03/20 0017  GLUCAP 107* 105* 162* 127* 133*    No results found for this or any previous visit (from the past 240 hour(s)).   Studies: No results found.  Scheduled Meds: . acetaminophen (TYLENOL) oral liquid 160 mg/5 mL  650 mg Per Tube Q6H  . amLODipine  10 mg Per Tube Daily  . bacitracin   Topical BID  . baclofen  30 mg Per Tube TID  . chlorhexidine gluconate (MEDLINE KIT)  15 mL Mouth Rinse BID  . Darunavir-Cobicisctat-Emtricitabine-Tenofovir Alafenamide  1 tablet Oral Q breakfast  . famotidine  10.4 mg Per Tube BID  . feeding supplement (OSMOLITE 1.5 CAL)  474 mL Per Tube QID  . feeding supplement (PROSource TF)  45 mL Per Tube Daily  . fiber  1 packet Per Tube BID  . folic acid  1 mg Per Tube Daily  . free water  150 mL Per Tube Q8H  . gabapentin  200 mg Per Tube  Q8H  . heparin injection (subcutaneous)  5,000 Units Subcutaneous Q8H  . levETIRAcetam  1,500 mg Per Tube BID  . magic mouthwash w/lidocaine  5 mL Oral QID  . Muscle Rub   Topical QID  . oxyCODONE  5 mg Per Tube Q6H  . polyethylene glycol  17 g Per Tube QHS  . QUEtiapine  12.5 mg Per Tube QHS  . scopolamine  1 patch Transdermal Q72H  . sulfamethoxazole-trimethoprim  1 tablet Per Tube Once per day on Mon Wed Fri  . thiamine  100 mg Per Tube Daily  . tiZANidine  6 mg Per Tube Q6H   Continuous Infusions:   Active Problems:   Cardiac arrest [facility])   Community acquired pneumonia of left upper lobe of lung   Anoxic brain injury ([place])   Alcohol abuse   Acute respiratory failure with hypoxia (HCC)   Vomiting   Pressure injury of  skin   Small bowel obstruction ([place])   Palliative care encounter   Bowel obstruction (HCC)   Chronic respiratory failure (HCC)   Diastolic dysfunction   Tracheostomy dependent ([place])   Dysphagia   Protein-calorie malnutrition ([place])   Seizures ([place])   Bilateral recurrent inguinal hernia   Muscle spasticity   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HCC)   Tracheobronchitis   Sequela of Corynebacterium infection   Abnormal urine   Urinary tract infection due to Enterococcus   Fever   Malnutrition of moderate degree   Consultants:  PCCM  Palliative medicine  General surgery  Neurology  Interventional radiology  Procedures:  EEG  Echocardiogram  Tracheostomy  Antibiotics: Anti-infectives (From admission, onward)   Start     Dose/Rate Route Frequency Ordered Stop   02/17/20 1100  nitrofurantoin (FURADANTIN) 25 MG/5ML suspension 100 mg        100 mg Per Tube Every 12 hours 02/17/20 1006 02/20/20 0924   02/15/20 1000  vancomycin (VANCOCIN) IVPB 1000 mg/200 mL premix  Status:  Discontinued        1,000 mg 200 mL/hr over 60 Minutes Intravenous Every 12 hours 02/14/20 2226 02/16/20 0742   02/14/20 2245  piperacillin-tazobactam (ZOSYN)  IVPB 3.375 g  Status:  Discontinued        3.375 g 12.5 mL/hr over 240 Minutes Intravenous Every 8 hours 02/14/20 2226 02/16/20 0742   02/14/20 2245  vancomycin (VANCOREADY) IVPB 1500 mg/300 mL        1,500 mg 150 mL/hr over 120 Minutes Intravenous  Once 02/14/20 2226 02/15/20 0739   02/01/20 2200  amoxicillin-clavulanate (AUGMENTIN) 250-62.5 MG/5ML suspension 500 mg        500 mg Per Tube Every 8 hours 02/01/20 1343 02/06/20 1333   01/31/20 1015  levofloxacin (LEVAQUIN) 25 MG/ML solution 500 mg  Status:  Discontinued        500 mg Per Tube Daily 01/31/20 0919 02/01/20 1343   01/31/20 1000  cefTRIAXone (ROCEPHIN) 1 g in sodium chloride 0.9 % 100 mL IVPB  Status:  Discontinued        1 g 200 mL/hr over 30 Minutes Intravenous Every 24 hours 01/31/20 0905 01/31/20 0919   01/19/20 2200  linezolid (ZYVOX) tablet 600 mg        600 mg Per Tube Every 12 hours 01/19/20 1534 01/25/20 2019   01/19/20 1100  levofloxacin (LEVAQUIN) tablet 500 mg  Status:  Discontinued        500 mg Per Tube Daily 01/19/20 1004 01/19/20 1534   12/19/19 0930  Darunavir-Cobicisctat-Emtricitabine-Tenofovir Alafenamide (SYMTUZA) 800-150-200-10 MG TABS 1 tablet        1 tablet Oral Daily with breakfast 12/19/19 0840     12/13/19 1000  bictegravir-emtricitabine-tenofovir AF (BIKTARVY) 50-200-25 MG per tablet 1 tablet  Status:  Discontinued        1 tablet Oral Daily 12/12/19 1413 12/19/19 0840   11/17/19 1548  ceFAZolin (ANCEF) 2-4 GM/100ML-% IVPB       Note to Pharmacy: Domenick Bookbinder   : cabinet override      11/17/19 1548 11/18/19 0359   11/16/19 1515  ceFAZolin (ANCEF) IVPB 2g/100 mL premix        2 g 200 mL/hr over 30 Minutes Intravenous To Radiology 11/16/19 1511 11/17/19 1625   11/15/19 0900  sulfamethoxazole-trimethoprim (BACTRIM DS) 800-160 MG per tablet 1 tablet        1 tablet Per Tube Once per day on Mon Wed Fri 11/13/19 1906  11/13/19 1615  dolutegravir (TIVICAY) tablet 50 mg  Status:  Discontinued         50 mg Oral Daily 11/13/19 1521 12/12/19 1413   11/13/19 1615  emtricitabine-tenofovir AF (DESCOVY) 200-25 MG per tablet 1 tablet  Status:  Discontinued        1 tablet Per Tube Daily 11/13/19 1521 12/12/19 1413   11/13/19 1530  sulfamethoxazole-trimethoprim (BACTRIM DS) 800-160 MG per tablet 1 tablet  Status:  Discontinued        1 tablet Oral Once per day on Mon Wed Fri 11/13/19 1441 11/13/19 1906   11/13/19 1500  bictegravir-emtricitabine-tenofovir AF (BIKTARVY) 50-200-25 MG per tablet 1 tablet  Status:  Discontinued        1 tablet Oral Daily 11/13/19 1403 11/13/19 1521   11/10/19 1000  erythromycin 250 mg in sodium chloride 0.9 % 100 mL IVPB        250 mg 100 mL/hr over 60 Minutes Intravenous Every 8 hours 11/10/19 0907 11/11/19 2000   11/07/19 1200  ceFEPIme (MAXIPIME) 2 g in sodium chloride 0.9 % 100 mL IVPB        2 g 200 mL/hr over 30 Minutes Intravenous Every 8 hours 11/07/19 1013 11/13/19 2127   11/03/19 1200  cefTRIAXone (ROCEPHIN) 2 g in sodium chloride 0.9 % 100 mL IVPB        2 g 200 mL/hr over 30 Minutes Intravenous Every 24 hours 11/03/19 0922 11/05/19 1427   11/02/19 0800  vancomycin (VANCOREADY) IVPB 750 mg/150 mL  Status:  Discontinued        750 mg 150 mL/hr over 60 Minutes Intravenous Every 12 hours 11/01/19 1935 11/02/19 1105   11/02/19 0600  piperacillin-tazobactam (ZOSYN) IVPB 3.375 g  Status:  Discontinued        3.375 g 12.5 mL/hr over 240 Minutes Intravenous Every 8 hours 11/02/19 0311 11/03/19 0920   11/01/19 2200  ceFEPIme (MAXIPIME) 2 g in sodium chloride 0.9 % 100 mL IVPB  Status:  Discontinued        2 g 200 mL/hr over 30 Minutes Intravenous Every 12 hours 11/01/19 2143 11/02/19 0311   11/01/19 1915  vancomycin (VANCOREADY) IVPB 1500 mg/300 mL        1,500 mg 150 mL/hr over 120 Minutes Intravenous  Once 11/01/19 1909 11/01/19 2147   11/01/19 1845  cefTRIAXone (ROCEPHIN) 1 g in sodium chloride 0.9 % 100 mL IVPB        1 g 200 mL/hr over 30 Minutes  Intravenous  Once 11/01/19 1842 11/01/19 2051   11/01/19 1845  azithromycin (ZITHROMAX) 500 mg in sodium chloride 0.9 % 250 mL IVPB        500 mg 250 mL/hr over 60 Minutes Intravenous  Once 11/01/19 1842 11/01/19 2051       Time spent: 20 minutes    Erin Hearing ANP  [facility]  7 am-330 pm/M-F for direct patient care and secure chat Please refer to [place] for contact information 04/03/2020, 7:50 AM  LOS: 154 days

## 2020-04-03 NOTE — TOC Progression Note (Signed)
Transition of Care [facility]) - Progression Note    Patient Details  Name: Ryan Orozco MRN: [id number] Date of Birth: 05-04-1965  [facility] CM/SW Contact  Curlene Labrum, RN Phone Number: 04/03/2020, 2:31 PM  Clinical Narrative:    Case management met the patient at the bedside for assessment and transitions of care planning.  I spoke with Alma Friendly, CM at [place] SNF facility and she asked for updated clinicals sent to be reviewed for possible admission. Updated clinicals send to the facility through secure email - [email or web address].  I spoke with Leavy Cella pharmacist, and he will look into having patient's HIV medications available to the SNF for support.  CM will continue to follow the patient for SNF admission and support through LOG from [facility] until [facility] is approved.  Patient is being evaluated by CCM at this time to determine if the patient can be decannulated prior to his discharge to the SNF facility.  The pending SNF facility may be willing to admit the patient with or without his existing trach and trach collar.     Expected Discharge Plan: Skilled Nursing Facility Barriers to Discharge: Continued Medical Work up,SNF Pending bed offer  Expected Discharge Plan and Services Expected Discharge Plan: [place]   Discharge Planning Services: CM Consult Post Acute Care Choice: Nursing Home Living arrangements for the past 2 months: Single Family Home                                       Social Determinants of Health (SDOH) Interventions    Readmission Risk Interventions Readmission Risk Prevention Plan 02/06/2020  Transportation Screening Complete  PCP or Specialist Appt within 3-5 Days Complete  HRI or [place] Complete  Social Work Consult for [place] Planning/Counseling Complete  Palliative Care Screening Complete  Medication Review Press photographer) Complete  Some recent data  might be hidden

## 2020-04-03 NOTE — Plan of Care (Signed)
  Problem: Clinical Measurements: Goal: Will remain free from infection Outcome: Progressing Goal: Diagnostic test results will improve Outcome: Progressing Goal: Respiratory complications will improve Outcome: Progressing Goal: Cardiovascular complication will be avoided Outcome: Progressing   Problem: Activity: Goal: Risk for activity intolerance will decrease Outcome: Progressing   Problem: Nutrition: Goal: Adequate nutrition will be maintained Outcome: Progressing   Problem: Coping: Goal: Level of anxiety will decrease Outcome: Progressing   Problem: Elimination: Goal: Will not experience complications related to bowel motility Outcome: Progressing Goal: Will not experience complications related to urinary retention Outcome: Progressing   Problem: Pain Managment: Goal: General experience of comfort will improve Outcome: Progressing   Problem: Safety: Goal: Ability to remain free from injury will improve Outcome: Progressing   Problem: Skin Integrity: Goal: Risk for impaired skin integrity will decrease Outcome: Progressing   Problem: Education: Goal: 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 Outcome: Progressing   Problem: Activity: Goal: Ability to tolerate increased activity will improve Outcome: Progressing   Problem: Health Behavior/Discharge Planning: Goal: 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 Outcome: Progressing   Problem: Respiratory: Goal: Patent airway maintenance will improve Outcome: Progressing   Problem: Role Relationship: Goal: Ability to communicate will improve Outcome: Progressing   Problem: Education: Goal: Knowledge of the prescribed therapeutic regimen Outcome: Progressing Goal: Knowledge of disease or condition will improve Outcome: Progressing   Problem: Clinical Measurements: Goal: Neurologic status will improve Outcome: Progressing   Problem: Tissue Perfusion: Goal: Ability to maintain  intracranial pressure will improve Outcome: Progressing   Problem: Respiratory: Goal: Will regain and/or maintain adequate ventilation Outcome: Progressing   Problem: Skin Integrity: Goal: Risk for impaired skin integrity will decrease Outcome: Progressing Goal: Demonstration of wound healing without infection will improve Outcome: Progressing   Problem: Psychosocial: Goal: Ability to verbalize positive feelings about self will improve Outcome: Progressing Goal: Ability to participate in self-care as condition permits will improve Outcome: Progressing Goal: Ability to identify appropriate support needs will improve Outcome: Progressing   Problem: Health Behavior/Discharge Planning: Goal: Ability to manage health-related needs will improve Outcome: Progressing   Problem: Nutritional: Goal: Risk of aspiration will decrease Outcome: Progressing Goal: Dietary intake will improve Outcome: Progressing   Problem: Communication: Goal: Ability to communicate needs accurately will improve Outcome: Progressing   

## 2020-04-03 NOTE — Progress Notes (Signed)
   04/03/20 1235  RT Progression Team  Trach Progression Other (comment) (Capping trials to room air per MD order. Keep on 48 hrs. in hopes of decannulation Friday.)  O2 Device Room Air  SpO2 97 %  Tracheostomy Shiley Flexible 4 mm Uncuffed  Placement Date/Time: 03/11/20 1155   Placed By: Self  Brand: Shiley Flexible  Size (mm): 4 mm  Style: Uncuffed  Status Capped

## 2020-04-03 NOTE — Progress Notes (Signed)
   NAMEJorah Orozco, MRN:  [id number], DOB:  1965-08-08, LOS: 154 ADMISSION DATE:  11/01/2019, CONSULTATION DATE:  9/29 REFERRING MD:  EDP, CHIEF COMPLAINT:  Cardiac arrest   Brief History   55 y/o male with HIV found down outside a convenience store on 9/27, received out of hospital CPR.  After ICU admission has had acute encephalopathy, concern for anoxic brain injury.  Received a tracheostomy 10/13.    Past Medical History  ETOH Abuse HIV - dx 1995  Significant Hospital Events   9/29 Admit post PEA arrest.  UDS positive for THC, benzo's.  ETOH 177 10/05 EEG ongoing. Versed restarted overnight due to agitation. Nicardipine increased.  10/06 Developed tachypnea with WUA, no follow commands off sedation  10/07 Vomiting, TF held / restarted with recurrent vomiting 10/16 tracheostomy collar for 9 hours 10/18 Off vent all night  10/20 ATC 11/15 Trach changed to #4 shiley cuffless 11/22 start capping trials    Interim history/subjective:  No distress.  Currently on humidified room air.  Reported that he is capped however I seen you In the room and only has PMV at bedside.  I placed a red occlusive On his size 4 trach Objective   Blood pressure 112/80, pulse 81, temperature 98.5 F (36.9 C), temperature source Oral, resp. rate 16, height 5\' 6"  (1.676 m), weight 73.5 kg, SpO2 100 %.    FiO2 (%):  [21 %] 21 %   Intake/Output Summary (Last 24 hours) at 04/03/2020 1219 Last data filed at 04/02/2020 2137 Gross per 24 hour  Intake 0 ml  Output 990 ml  Net -990 ml   Filed Weights   03/24/20 0500 03/25/20 0500 03/30/20 0340  Weight: 73.5 kg 73.5 kg 73.5 kg   Physical Exam:  General 55 year old male patient currently resting in bed he is in no acute distress this morning HEENT normocephalic atraumatic the size 4 tracheostomy is now capped excellent air mechanics no stridor noted cough is strong Cardiac regular rate and rhythm Pulmonary: Clear diminished bases no accessory  use Abdomen soft Extremities warm dry contracted lowers Neuro awake follows commands tries to communicate 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 S/p trach due to failure to wean from ventilator.  -Off vent since November. -Down sized to 4 cuffless 2/7 -Uncapped his trach at bedside.  His cough mechanics seem strong.  He has no tracheal secretions.  He has expressive aphasia but phonation seems strong.  I think he can protect his airway.  Plan Have spoke with both nursing and respiratory staff Will keep cuffed tracheostomy in place for the next 48 hours Continue his continuous pulse oximetry I have asked nursing and respiratory staff to document any requirement for suctioning and or removal of tracheostomy In the progress note  If he does well over the next 48 hours I think we can safely proceed with decannulation I spoke to his daughter and sister and updated them on plan of care and they are in agreement  4/7 ACNP-BC [facility] Pulmonary/Critical Care Pager # [phone] OR # [phone] if no answer

## 2020-04-03 NOTE — Progress Notes (Signed)
Patient being seen daily in conjunction with NP, appears comfortable and in no distress, electrolytes checked on 03/31/2020 stable.Kindly see NP note for all details. Blood pressure running slightly high will monitor the trend.  Wt Readings from Last 3 Encounters:  03/30/20 73.5 kg  10/30/19 78.9 kg   Temp Readings from Last 3 Encounters:  04/03/20 98.5 F (36.9 C) (Oral)  10/30/19 98.7 F (37.1 C) (Oral)  02/18/18 97.8 F (36.6 C) (Oral)   BP Readings from Last 3 Encounters:  04/03/20 112/80  10/30/19 (!) 158/80  02/18/18 (!) 162/96   Pulse Readings from Last 3 Encounters:  04/03/20 77  10/30/19 68  02/18/18 64    Signature  Susa Raring M.D on 04/03/2020 at 9:13 AM   -  To page go to [email or web address]

## 2020-04-04 LAB — GLUCOSE, CAPILLARY
Glucose-Capillary: 126 mg/dL — ABNORMAL HIGH (ref 70–99)
Glucose-Capillary: 129 mg/dL — ABNORMAL HIGH (ref 70–99)
Glucose-Capillary: 183 mg/dL — ABNORMAL HIGH (ref 70–99)
Glucose-Capillary: 91 mg/dL (ref 70–99)
Glucose-Capillary: 91 mg/dL (ref 70–99)
Glucose-Capillary: 92 mg/dL (ref 70–99)

## 2020-04-04 MED ORDER — ACETAMINOPHEN 160 MG/5ML PO SOLN: 650.0000 mg | Freq: Four times a day (QID) | ORAL | 0 refills | Status: AC | PRN

## 2020-04-04 MED ORDER — BACLOFEN 10 MG PO TABS: 30.0000 mg | ORAL_TABLET | Freq: Three times a day (TID) | ORAL | 0 refills | Status: AC

## 2020-04-04 MED ORDER — DARUN-COBIC-EMTRICIT-TENOFAF 800-150-200-10 MG PO TABS: 1.0000 | ORAL_TABLET | Freq: Every day | ORAL | 12 refills | Status: AC

## 2020-04-04 MED ORDER — AMLODIPINE BESYLATE 10 MG PO TABS: 10.0000 mg | ORAL_TABLET | Freq: Every day | ORAL | Status: AC

## 2020-04-04 MED ORDER — FREE WATER: 150.0000 mL | Freq: Three times a day (TID) | Status: AC

## 2020-04-04 MED ORDER — QUETIAPINE FUMARATE 25 MG PO TABS: 12.5000 mg | ORAL_TABLET | Freq: Every day | ORAL | 0 refills | Status: AC

## 2020-04-04 MED ORDER — SULFAMETHOXAZOLE-TRIMETHOPRIM 800-160 MG PO TABS: 1.0000 | ORAL_TABLET | ORAL | 0 refills | Status: AC

## 2020-04-04 MED ORDER — OXYCODONE HCL 5 MG/5ML PO SOLN: 5.0000 mg | Freq: Four times a day (QID) | ORAL | 0 refills | Status: AC

## 2020-04-04 MED ORDER — LEVETIRACETAM 100 MG/ML PO SOLN: 1500.0000 mg | Freq: Two times a day (BID) | ORAL | 12 refills | Status: AC

## 2020-04-04 MED ORDER — FOLIC ACID 1 MG PO TABS: 1.0000 mg | ORAL_TABLET | Freq: Every day | ORAL | Status: AC

## 2020-04-04 MED ORDER — PROSOURCE TF PO LIQD: 45.0000 mL | Freq: Every day | ORAL | Status: AC

## 2020-04-04 MED ORDER — TIZANIDINE HCL 2 MG PO TABS: 6.0000 mg | ORAL_TABLET | Freq: Four times a day (QID) | ORAL | 0 refills | Status: AC

## 2020-04-04 MED ORDER — NUTRISOURCE FIBER PO PACK: 1.0000 | PACK | Freq: Two times a day (BID) | ORAL | Status: AC

## 2020-04-04 MED ORDER — OSMOLITE 1.5 CAL PO LIQD: 474.0000 mL | Freq: Four times a day (QID) | ORAL | 0 refills | Status: AC

## 2020-04-04 MED ORDER — THIAMINE HCL 100 MG PO TABS: 100.0000 mg | ORAL_TABLET | Freq: Every day | ORAL | Status: AC

## 2020-04-04 MED ORDER — MUSCLE RUB 10-15 % EX CREA: 1.0000 "application " | TOPICAL_CREAM | Freq: Four times a day (QID) | CUTANEOUS | 0 refills | Status: AC

## 2020-04-04 MED ORDER — POLYETHYLENE GLYCOL 3350 17 G PO PACK: 17.0000 g | PACK | Freq: Every day | ORAL | 0 refills | Status: AC

## 2020-04-04 MED ORDER — SCOPOLAMINE 1 MG/3DAYS TD PT72: 1.0000 | MEDICATED_PATCH | TRANSDERMAL | 12 refills | Status: AC

## 2020-04-04 MED ORDER — DOCUSATE SODIUM 50 MG/5ML PO LIQD: 100.0000 mg | Freq: Two times a day (BID) | ORAL | 0 refills | Status: AC | PRN

## 2020-04-04 MED ORDER — FAMOTIDINE 40 MG/5ML PO SUSR: 10.0000 mg | Freq: Two times a day (BID) | ORAL | 0 refills | Status: AC

## 2020-04-04 MED ORDER — ACETAMINOPHEN 160 MG/5ML PO SOLN: 650.0000 mg | Freq: Four times a day (QID) | ORAL | 0 refills | Status: AC

## 2020-04-04 MED ORDER — BISACODYL 10 MG RE SUPP: 10.0000 mg | Freq: Every day | RECTAL | 0 refills | Status: AC | PRN

## 2020-04-04 MED ORDER — GABAPENTIN 250 MG/5ML PO SOLN: 200.0000 mg | Freq: Three times a day (TID) | ORAL | 12 refills | Status: AC

## 2020-04-04 NOTE — Progress Notes (Addendum)
[facility] PROGRESS NOTE  Ryan Orozco LKT:[id number] DOB: 07-07-1965 DOA: 11/01/2019 PCP: Default, Provider, MD       12/19/19                          03/15/20 OOB to chair   Status: Remains inpatient appropriate because:Altered mental status, Unsafe d/c plan and Inpatient level of care appropriate due to severity of illness. Patient newly diagnosed with anoxic brain injury  Dispo: The patient is from: Home              Anticipated d/c is to: SNF              Anticipated d/c date is: > 3 days              Patient currently is medically stable to d/c.  As of 3/3 has bed offer at [facility] in [place].  Anticipated discharge date Monday 3/7.  Still awaiting approval of Medicaid and disability. HIV meds $4000/mo but covered at no fee thru [employer]). [facility] reviewing clinicals   Code Status: DNR after d/w palliative team on 1/27 Family Communication: 3/3 updated dtr [name] via text  DVT prophylaxis: Subcutaneous heparin Vaccination status: Will order first dose Pfizer COVID-vaccine on 1/10; influenza vaccination given 1/9-2nd dose given 2/7  Foley catheter:  No  HPI: 55 year old male with HIV, chronic alcohol abuse who was presented to the emergency department after being found unresponsive outside a convinient store.  EMS found him pulseless in PEA, unknown downtime, successfully resuscitated and brought to the emergency department.  UDS positive for THC and benzodiazepines.  CT head unremarkable.  Failed extubation trials due to severe anoxic brain injury and had tracheostomy placed.  PEG placed on 10/15.  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  This has led to significant pain as well.  [name] was consulted and recommendations/input/orders highly appreciated.  She may also benefit from Botox injections to both  hips to aid in treatment of spasticity.   Significant Hospital Events   9/29 Admit post PEA arrest. UDS positive for THC, benzo's. ETOH 177 10/05 EEG ongoing. Versed restarted overnight due to agitation. Nicardipine increased.  10/06 Developed tachypnea with WUA, no follow commands off sedation  10/07 Vomiting, TF held / restarted with recurrent vomiting 10/16 tracheostomy collar for 9 hours 10/18->10/19 off vent all night  10/20> TC 11/15 > downsized to #4 Shiley flex CFS  Subjective: Awake, alert, smiling and laughing with both this Probation officer and multiple staff members.  Objective: Vitals:   04/03/20 2030 04/04/20 0522  BP: 124/65 139/88  Pulse: 76 79  Resp: 20 20  Temp: 99.2 F (37.3 C) 98.6 F (37 C)  SpO2: 97% 98%    Intake/Output Summary (Last 24 hours) at 04/04/2020 0737 Last data filed at 04/04/2020 0404 Gross per 24 hour  Intake 100 ml  Output 1900 ml  Net -1800 ml   Filed Weights   03/24/20 0500 03/25/20 0500 03/30/20 0340  Weight: 73.5 kg 73.5 kg 73.5 kg    Exam: General: Alert, laughing, no acute distress Pulmonary:  Trach capped.  Tolerating well Cardiac: Normal heart sounds, no tachycardia, pulse regular, no peripheral edema Abdomen: PEG tube for feedings and medications.  Abdomen soft nontender nondistended with normoactive bowel sounds LBM 3/2 Neurological: Severe hypertonicity/spasticity bilateral lower extremities involving the hip flexors and hamstrings;  Keeps bilateral hips  flexed secondary to hip flexor contractures essentially nonpurposeful use of upper extremities at times it does appear as if he is motioning as directionally in regards to repositioning.   Psychiatric: Alert and oriented to name.  Nonverbal.  Laughing. Skin: Wound on dorsum of left foot improving and decreased in size.  No periwound erythema.  Fingernails of both hands trimmed to prevent accidental self injury on 3/1.  Assessment/Plan: Acute problems: Anoxic brain injury 2/2 PEA  arrest/pain syndrome secondary to hypertonicity/spastic tetraplegia:  -Presumed 2/2 combination alcohol and BZD overdose (UDS + for THC and BZDs) -Significant brain injury/spastic tetraplegia with patient now completely bedbound -Continue baclofen, gabapentin, scheduled oxycodone and Zanaflex for spastic tetraplegia; continue Seroquel -1/6 [name] with rehabilitative medicine performed Botox injection into the quadriceps and rectus femoris as well as the biceps femoris and hamstrings, semimembranosus and semitendinosus.  Unable to inject iliopsoas without EMG guidance w/o significant improvement and spasticity hypertonicity.  Unfortunately given lack of improvement he now has bilateral hip flexor contractures -Continue bilateral UE WHO resting splints/PROM q 4 hours/KREG bed -Continue out of bed to chair with lift  Blister dorsum left foot  03/15/20 -Appreciate wound care nurse assistance.   -Continue conservative wound care  Goals of care:  -Palliative care team reconsulted on 1/27 and after an extensive discussion daughter does realize that patient quite debilitated and would not survive a resuscitation therefore patient made a DNR  Chronic respiratory failure with inability to maintain patent airway requiring chronic tracheostomy tube/Recent Corynebacterium tracheobronchitis (resolved) -Continue #4 cuffless trach in place-trach changed out on 1/10 RT -Given altered mentation and inability to manage secretions PCCM states patient is not a candidate for elective decannulation -3/2 patient has been alert without significant tracheal secretions for several months.  Trach team has reevaluated.  Capping trials resumed for 48 hours and he has been tolerating well.  Likely will be decannulated on Friday 3/4  HIV:  -CD4 count of 124 October 2021 (had been as high as 250 Dec 2020)-as of today up to 259; viral load obtained on 1/15 was <20 -Patient will be able to continue [place] after discharge.   He is enrolled in the [employer] drug assistance program through [employer].  Initial prescription will be sent to local [facility] and sent with patient upon discharge.  Local pharmacy in [place]/same location SNF confirmed that they have the medication and except [place] drug assistance medications -CD4 count on 12/23 up to 259 -ID recommends continuing prophylactic Bactrim an additional 3 months (last dose 04/24/2020)  Dysphagia 2/2 anoxic brain injury -Continue tube feeding per PEG -Given altered mentation patient not a candidate for further speech evaluations and will be permanently dependent on enteral tube feedings  Constipation -We will continue scheduled Colace, fiber supplements per tube as well as daily MiraLAX -Suspect requirement for narcotics and multiple muscle relaxers contributing to decreased GI motility and constipation symptoms  Moderate Protein calorie malnutrition nutrition Status: Nutrition Problem: Moderate Malnutrition Etiology: chronic illness Signs/Symptoms: mild fat depletion,mild muscle depletion Interventions: Tube feeding bolus doses of Osmolite 1.5 2 cartons, Juven twice daily, Prosource TF 45 mL 3 times daily and Nutrisouce fiber BID and free water 150 cc q 8 hrs Estimated body mass index is 26.15 kg/m as calculated from the following:   Height as of this encounter: 5' 6"  (1.676 m).   Weight as of this encounter: 73.5 kg.   Pressure area dorsum left foot not POA Wound / Incision (Open or Dehisced) 11/17/19 Puncture  Abdomen Left;Anterior;Upper G-tube insertion site  (Active)  Date First Assessed/Time First Assessed: 11/17/19 1646   Wound Type: Puncture  Location: Abdomen  Location Orientation: Left;Anterior;Upper  Wound Description (Comments): G-tube insertion site   Present on Admission: No    Assessments 11/17/2019  4:47 PM 04/03/2020  9:00 PM  Dressing Type Gauze (Comment);Tape dressing Gauze (Comment);Tape dressing  Dressing  Changed New New  Dressing Status Clean;Dry;Intact Clean;Dry;Intact  Dressing Change Frequency PRN PRN  Site / Wound Assessment Clean;Dry;Pink Dressing in place / Unable to assess  Peri-wound Assessment Intact --  Margins -- Unattached edges (unapproximated)  Closure None None  Drainage Amount None None  Treatment Cleansed --     No Linked orders to display     Wound / Incision (Open or Dehisced) 03/16/20 Other (Comment) Foot Anterior;Left (Active)  Date First Assessed/Time First Assessed: 03/16/20 0800   Wound Type: (c) Other (Comment)  Location: Foot  Location Orientation: Anterior;Left  Present on Admission: No    Assessments 03/16/2020  8:00 AM 04/03/2020  9:00 PM  Dressing Type Other (Comment) --  Dressing Changed Changed --  Dressing Status Old drainage --  Dressing Change Frequency Daily --  Site / Wound Assessment Pink;Red --  Peri-wound Assessment Intact Intact  Drainage Amount Minimal None  Drainage Description Serosanguineous --  Treatment Cleansed --     No Linked orders to display      Other problems: Hypertension/grade 1 diastolic dysfunction:  -Continue amlodipine  -Echocardiogram this admission with preserved LVEF with evidence of grade   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   -Continue Keppra; levetiracetam level 35.9 on 11/12 -no further seizure activity since transitioned out of ICU setting therefore will discontinue seizure precaution  SIRS/Fever 2/2 recurrent enterococcus UTI -Patient developed mild elevation in temperature overnight to 1/13 associated with slight increase in respiratory secretions and congested sounding cough/abnormal pulmonary exam as well as tachycardia./14 respiratory status much improved and no further fevers. -Respiratory viral panel neg for COVID, influenza and RSV.   -UA slightly abnormal/cx c/w enterococcus UTI- started on nitrofurantoin (prior UTI treated w/ augmentin) has completed 3 days of treatment -X-ray  with bilateral opacities concerning for early pneumonia but CT of the chest without pneumonia, broad-spectrum antibiotics dc'd 1/14 -Because of dehydration increased frequency of free water from 200 cc every 4 hours to every 2 hours on 1/13- stopped on 1/15  Fever/peri-PEG drainage positive for corynebacterium and rare Pseudomonas -PEG site with some drainage but no erythema, induration or pain -Suspect may have colonization noting recently had corynebacterium tracheobronchitis treated -continue bacitracin ointment  Low-grade fever/abnormal urinalysis -2/8: Solitary T-max 100.5 overnight.  Likely related to atelectasis.  Obtain chest x-ray today that was unremarkable -As a precaution we will go ahead and obtain urinalysis and culture and blood cultures; electrolytes unremarkable, LDH normal at 178, no leukocytosis -Urinalysis abnormal with hazy appearance, trace leukocytes, rare bacteria and 11-20 WBCs-urine culture was never obtained and subsequently fever has resolved -Blood cultures are negative -Chest x-ray obtained on 2/14 unremarkable  Inguinal hernias -Evaluated by general surgery this admission. Documented as chronically incarcerated. Patient has been clinically stable and tolerating tube feedings and having bowel movements so unless patient obstructed would not be considered a candidate for repair. Even if obstructed consulting surgeon was not sure he would be a candidate for hernia repair regardless  Constipation -No documented bowel movement since 1/13 -We will continue scheduled Colace, fiber supplements per tube -Add daily MiraLAX -Given one-time dose milk of magnesia  on 1/20-no documented bowel movement on flowsheet -Suspect requirement for narcotics and multiple muscle relaxers contributing to decreased GI motility and constipation symptoms  Goals of care:  -Palliative care was involved during this hospitalization.   -Goals of care were discussed. remains full code.   -Palliative care recommended outpatient follow-up with palliative providers. -Reconsult Palliative medicine 1/27   Data Reviewed: Basic Metabolic Panel: Recent Labs  Lab 03/31/20 0144  NA 140  K 4.1  CL 101  CO2 28  GLUCOSE 90  BUN 18  CREATININE 0.71  CALCIUM 10.7*  MG 1.9   Liver Function Tests: Recent Labs  Lab 03/31/20 0144  AST 21  ALT 18  ALKPHOS 92  BILITOT 0.4  PROT 7.7  ALBUMIN 3.6   No results for input(s): LIPASE, AMYLASE in the last 168 hours. No results for input(s): AMMONIA in the last 168 hours. CBC: Recent Labs  Lab 03/31/20 0144  WBC 4.7  NEUTROABS 2.9  HGB 13.7  HCT 41.8  MCV 94.4  PLT 142*   Cardiac Enzymes: No results for input(s): CKTOTAL, CKMB, CKMBINDEX, TROPONINI in the last 168 hours. BNP (last 3 results) Recent Labs    02/15/20 0433  BNP 3.9    ProBNP (last 3 results) No results for input(s): PROBNP in the last 8760 hours.  CBG: Recent Labs  Lab 04/03/20 0805 04/03/20 1134 04/03/20 1540 04/03/20 2033 04/03/20 2301  GLUCAP 97 117* 162* 136* 141*    No results found for this or any previous visit (from the past 240 hour(s)).   Studies: No results found.  Scheduled Meds: . acetaminophen (TYLENOL) oral liquid 160 mg/5 mL  650 mg Per Tube Q6H  . amLODipine  10 mg Per Tube Daily  . bacitracin   Topical BID  . baclofen  30 mg Per Tube TID  . chlorhexidine gluconate (MEDLINE KIT)  15 mL Mouth Rinse BID  . Darunavir-Cobicisctat-Emtricitabine-Tenofovir Alafenamide  1 tablet Oral Q breakfast  . famotidine  10.4 mg Per Tube BID  . feeding supplement (OSMOLITE 1.5 CAL)  474 mL Per Tube QID  . feeding supplement (PROSource TF)  45 mL Per Tube Daily  . fiber  1 packet Per Tube BID  . folic acid  1 mg Per Tube Daily  . free water  150 mL Per Tube Q8H  . gabapentin  200 mg Per Tube Q8H  . heparin injection (subcutaneous)  5,000 Units Subcutaneous Q8H  . levETIRAcetam  1,500 mg Per Tube BID  . magic mouthwash w/lidocaine   5 mL Oral QID  . Muscle Rub   Topical QID  . oxyCODONE  5 mg Per Tube Q6H  . polyethylene glycol  17 g Per Tube QHS  . QUEtiapine  12.5 mg Per Tube QHS  . scopolamine  1 patch Transdermal Q72H  . sulfamethoxazole-trimethoprim  1 tablet Per Tube Once per day on Mon Wed Fri  . thiamine  100 mg Per Tube Daily  . tiZANidine  6 mg Per Tube Q6H   Continuous Infusions:   Active Problems:   Cardiac arrest [facility])   Community acquired pneumonia of left upper lobe of lung   Anoxic brain injury ([place])   Alcohol abuse   Acute respiratory failure with hypoxia (HCC)   Vomiting   Pressure injury of skin   Small bowel obstruction [facility])   Palliative care encounter   Bowel obstruction (HCC)   Chronic respiratory failure (HCC)   Diastolic dysfunction   Tracheostomy dependent (HCC)   Dysphagia  Protein-calorie malnutrition ([place])   Seizures ([place])   Bilateral recurrent inguinal hernia   Muscle spasticity   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[place])   Tracheobronchitis   Sequela of Corynebacterium infection   Abnormal urine   Urinary tract infection due to Enterococcus   Fever   Malnutrition of moderate degree   Consultants:  PCCM  Palliative medicine  General surgery  Neurology  Interventional radiology  Procedures:  EEG  Echocardiogram  Tracheostomy  Antibiotics: Anti-infectives (From admission, onward)   Start     Dose/Rate Route Frequency Ordered Stop   02/17/20 1100  nitrofurantoin (FURADANTIN) 25 MG/5ML suspension 100 mg        100 mg Per Tube Every 12 hours 02/17/20 1006 02/20/20 0924   02/15/20 1000  vancomycin (VANCOCIN) IVPB 1000 mg/200 mL premix  Status:  Discontinued        1,000 mg 200 mL/hr over 60 Minutes Intravenous Every 12 hours 02/14/20 2226 02/16/20 0742   02/14/20 2245  piperacillin-tazobactam (ZOSYN) IVPB 3.375 g  Status:  Discontinued        3.375 g 12.5 mL/hr over 240 Minutes Intravenous Every 8 hours 02/14/20 2226 02/16/20 0742   02/14/20  2245  vancomycin (VANCOREADY) IVPB 1500 mg/300 mL        1,500 mg 150 mL/hr over 120 Minutes Intravenous  Once 02/14/20 2226 02/15/20 0739   02/01/20 2200  amoxicillin-clavulanate (AUGMENTIN) 250-62.5 MG/5ML suspension 500 mg        500 mg Per Tube Every 8 hours 02/01/20 1343 02/06/20 1333   01/31/20 1015  levofloxacin (LEVAQUIN) 25 MG/ML solution 500 mg  Status:  Discontinued        500 mg Per Tube Daily 01/31/20 0919 02/01/20 1343   01/31/20 1000  cefTRIAXone (ROCEPHIN) 1 g in sodium chloride 0.9 % 100 mL IVPB  Status:  Discontinued        1 g 200 mL/hr over 30 Minutes Intravenous Every 24 hours 01/31/20 0905 01/31/20 0919   01/19/20 2200  linezolid (ZYVOX) tablet 600 mg        600 mg Per Tube Every 12 hours 01/19/20 1534 01/25/20 2019   01/19/20 1100  levofloxacin (LEVAQUIN) tablet 500 mg  Status:  Discontinued        500 mg Per Tube Daily 01/19/20 1004 01/19/20 1534   12/19/19 0930  Darunavir-Cobicisctat-Emtricitabine-Tenofovir Alafenamide (SYMTUZA) 800-150-200-10 MG TABS 1 tablet        1 tablet Oral Daily with breakfast 12/19/19 0840     12/13/19 1000  bictegravir-emtricitabine-tenofovir AF (BIKTARVY) 50-200-25 MG per tablet 1 tablet  Status:  Discontinued        1 tablet Oral Daily 12/12/19 1413 12/19/19 0840   11/17/19 1548  ceFAZolin (ANCEF) 2-4 GM/100ML-% IVPB       Note to Pharmacy: Domenick Bookbinder   : cabinet override      11/17/19 1548 11/18/19 0359   11/16/19 1515  ceFAZolin (ANCEF) IVPB 2g/100 mL premix        2 g 200 mL/hr over 30 Minutes Intravenous To Radiology 11/16/19 1511 11/17/19 1625   11/15/19 0900  sulfamethoxazole-trimethoprim (BACTRIM DS) 800-160 MG per tablet 1 tablet        1 tablet Per Tube Once per day on Mon Wed Fri 11/13/19 1906     11/13/19 1615  dolutegravir (TIVICAY) tablet 50 mg  Status:  Discontinued        50 mg Oral Daily 11/13/19 1521 12/12/19 1413   11/13/19 1615  emtricitabine-tenofovir AF (DESCOVY)  200-25 MG per tablet 1 tablet  Status:   Discontinued        1 tablet Per Tube Daily 11/13/19 1521 12/12/19 1413   11/13/19 1530  sulfamethoxazole-trimethoprim (BACTRIM DS) 800-160 MG per tablet 1 tablet  Status:  Discontinued        1 tablet Oral Once per day on Mon Wed Fri 11/13/19 1441 11/13/19 1906   11/13/19 1500  bictegravir-emtricitabine-tenofovir AF (BIKTARVY) 50-200-25 MG per tablet 1 tablet  Status:  Discontinued        1 tablet Oral Daily 11/13/19 1403 11/13/19 1521   11/10/19 1000  erythromycin 250 mg in sodium chloride 0.9 % 100 mL IVPB        250 mg 100 mL/hr over 60 Minutes Intravenous Every 8 hours 11/10/19 0907 11/11/19 2000   11/07/19 1200  ceFEPIme (MAXIPIME) 2 g in sodium chloride 0.9 % 100 mL IVPB        2 g 200 mL/hr over 30 Minutes Intravenous Every 8 hours 11/07/19 1013 11/13/19 2127   11/03/19 1200  cefTRIAXone (ROCEPHIN) 2 g in sodium chloride 0.9 % 100 mL IVPB        2 g 200 mL/hr over 30 Minutes Intravenous Every 24 hours 11/03/19 0922 11/05/19 1427   11/02/19 0800  vancomycin (VANCOREADY) IVPB 750 mg/150 mL  Status:  Discontinued        750 mg 150 mL/hr over 60 Minutes Intravenous Every 12 hours 11/01/19 1935 11/02/19 1105   11/02/19 0600  piperacillin-tazobactam (ZOSYN) IVPB 3.375 g  Status:  Discontinued        3.375 g 12.5 mL/hr over 240 Minutes Intravenous Every 8 hours 11/02/19 0311 11/03/19 0920   11/01/19 2200  ceFEPIme (MAXIPIME) 2 g in sodium chloride 0.9 % 100 mL IVPB  Status:  Discontinued        2 g 200 mL/hr over 30 Minutes Intravenous Every 12 hours 11/01/19 2143 11/02/19 0311   11/01/19 1915  vancomycin (VANCOREADY) IVPB 1500 mg/300 mL        1,500 mg 150 mL/hr over 120 Minutes Intravenous  Once 11/01/19 1909 11/01/19 2147   11/01/19 1845  cefTRIAXone (ROCEPHIN) 1 g in sodium chloride 0.9 % 100 mL IVPB        1 g 200 mL/hr over 30 Minutes Intravenous  Once 11/01/19 1842 11/01/19 2051   11/01/19 1845  azithromycin (ZITHROMAX) 500 mg in sodium chloride 0.9 % 250 mL IVPB        500  mg 250 mL/hr over 60 Minutes Intravenous  Once 11/01/19 1842 11/01/19 2051       Time spent: 20 minutes    Erin Hearing ANP  [facility]  7 am-330 pm/M-F for direct patient care and secure chat Please refer to [place] for contact information 04/04/2020, 7:37 AM  LOS: 155 days

## 2020-04-04 NOTE — Progress Notes (Addendum)
CSW confirmed bed offer with Isabelle Course at [facility] in [place] - patient can come to facility at the beginning of next week.  CSW spoke with [facility] Director Wandra Mannan to request his assistance in obtaining transportation to SNF.  Patient's family notified by [name], NP - they are agreeable to discharge plan.   Edwin Dada, MSW, LCSW Transitions of Care  Clinical Social Worker II [phone]

## 2020-04-04 NOTE — Discharge Summary (Addendum)
Physician Discharge Summary  [facility] BSJ:[id number] DOB: Jun 09, 1965 DOA: 11/01/2019  PCP: Default, Provider, MD   12/09/2019   03/15/20 OOB to chair   Admit date: 11/01/2019 Discharge date: 04/08/2020  Time spent: 42 minutes  Recommendations for Outpatient Follow-up:  1. Patient will need to establish with an HIV clinic in the area. He was previously established with the [facility] in [facility]. 2. [facility] 3. Ryan Orozco had HIV labs obtained in January of 2022 with a viral load of less than 20 CD4 count of 259. ID recommended to continue prophylactic Bactrim 3 times per week until 04/24/20   Discharge Diagnoses:  Active Problems:   Cardiac arrest [facility])   Community acquired pneumonia of left upper lobe of lung   Anoxic brain injury (HCC)   Alcohol abuse   Acute respiratory failure with hypoxia (HCC)   Vomiting   Pressure injury of skin   Small bowel obstruction (HCC)   Palliative care encounter   Bowel obstruction (HCC)   Chronic respiratory failure (HCC)   Diastolic dysfunction   Tracheostomy status (HCC)   Dysphagia   Protein-calorie malnutrition (HCC)   Seizures (HCC)   Bilateral recurrent inguinal hernia   Muscle spasticity   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HCC)   Tracheobronchitis   Sequela of Corynebacterium infection   Abnormal urine   Urinary tract infection due to Enterococcus   Fever   Malnutrition of moderate degree   Tracheostomy dependent (HCC)    Discharge Condition: Fair  Diet recommendation: Osmolite 1.5 tube feeding, oral medications and diet through PEG tube.  N.p.o. by mouth.  Continue follow-up with speech at SNF to look for any improvement  Filed Weights   04/06/20 0554 04/07/20 0635 04/08/20 0500  Weight: 74 kg 72 kg 74 kg    History of present illness:  55 year old male with HIV, chronic alcohol abuse who  was presented to the emergency department after being found unresponsive outside a convinient store. EMS found him pulseless in PEA, unknown downtime, successfully resuscitated and brought to the emergency department. UDS positive for THC and benzodiazepines. CT head unremarkable. Failed extubation trials due to severe anoxic brain injury and had tracheostomy placed. PEG placed on 10/15.  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  This has led to significant pain as well.  Dr. Nicki Guadalajara was consulted and recommendations/input/orders highly appreciated.  She may also benefit from Botox injections to both hips to aid in treatment of spasticity.    Hospital Course:  Acute problems:  Anoxic brain injury 2/2 PEA arrest/pain syndrome secondary to hypertonicity/spastic tetraplegia:  -Presumed 2/2 combination alcohol and BZD overdose (UDS + for THC and BZDs) -Significant brain injury/spastic tetraplegia with patient now completely bedbound -Continue baclofen, gabapentin, scheduled oxycodone and Zanaflex for spastic tetraplegia; continue Seroquel -1/6 Dr. Riley Kill with rehabilitative medicine performed Botox injection into the quadriceps and rectus femoris as well as the biceps femoris and hamstrings, semimembranosus and semitendinosus.  Unable to inject iliopsoas without EMG guidance w/o significant improvement and spasticity hypertonicity.  Unfortunately given lack of improvement he now has bilateral hip flexor contractures -Continue bilateral UE WHO resting splints/PROM q 4 hours/KREG bed -Continue out of bed to chair with lift, continue PT/OT at SNF.   Chronic respiratory failure with inability to maintain patent airway requiring chronic tracheostomy tube/Recent Corynebacterium tracheobronchitis (resolved) -Continue #4 cuffless trach in place-trach changed out  on 1/10 RT -Given altered mentation and inability to  manage secretions PCCM states patient is not a candidate for elective decannulation -3/4 DECANNULATED!!!!   HIV: -CD4 count of 124 October 2021 (had been as high as 250 Dec 2020)-as of today up to 259; viral load obtained on 1/15 was <20 -Patient will be able to continue Symtuza after discharge (was on Biktarvy prior to admission).  He is enrolled in the [employer] drug assistance program through [employer].  Initial prescription will be sent to local [facility] and sent with patient upon discharge.  Local pharmacy in [place]/same location SNF confirmed that they have the medication and except Juanell Fairly drug assistance medications -CD4 count on 12/23 up to 259 -ID recommends continuing prophylactic Bactrim an additional 3 months (last dose 04/24/2020) Follow with ID post discharge in 1 to 2 weeks.  Dysphagia 2/2 anoxic brain injury - Continue tube feeding per PEG   Moderate Protein calorie malnutrition nutrition Status:  has a PEG tube, tube feeds as below were given here, SNF can adjust as needed - Tube feeding bolus doses of Osmolite 1.5 2 cartons, Juven twice daily, Prosource TF 45 mL 3 times daily and Nutrisouce fiber BID and free water 150 cc q 8 hrs. Obtain dietary consult as soon as he arrives to SNF.   Hypertension/grade 1 diastolic dysfunction: Stable on Norvasc.  Echo shows a preserved EF.   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   Continue Keppra.  No further seizure activity  SIRS/Fever 2/2 recurrent enterococcus UTI - adequately treated earlier this admission now appears infection free.  Fever/peri-PEG drainage positive for corynebacterium and rare Pseudomonas - continue bacitracin ointment, keep site clean and dry.  Inguinal hernias -Evaluated by general surgery this admission.  Supportive care only.  Poor candidate for surgery.  Constipation -Stable on present bowel regimen  Goals of care: Seen by palliative care, now  DNR.  Blister dorsum left foot  03/15/20 -Appreciate wound care nurse assistance.   -Continue conservative wound care    Pressure area dorsum left foot not POA     Wound / Incision (Open or Dehisced) 11/17/19 Puncture Abdomen Left;Anterior;Upper G-tube insertion site  (Active)  Date First Assessed/Time First Assessed: 11/17/19 1646   Wound Type: Puncture  Location: Abdomen  Location Orientation: Left;Anterior;Upper  Wound Description (Comments): G-tube insertion site   Present on Admission: No    Assessments 11/17/2019  4:47 PM 04/03/2020  9:00 PM  Dressing Type Gauze (Comment);Tape dressing Gauze (Comment);Tape dressing  Dressing Changed New New  Dressing Status Clean;Dry;Intact Clean;Dry;Intact  Dressing Change Frequency PRN PRN  Site / Wound Assessment Clean;Dry;Pink Dressing in place / Unable to assess  Peri-wound Assessment Intact --  Margins -- Unattached edges (unapproximated)  Closure None None  Drainage Amount None None  Treatment Cleansed --     No Linked orders to display     Wound / Incision (Open or Dehisced) 03/16/20 Other (Comment) Foot Anterior;Left (Active)  Date First Assessed/Time First Assessed: 03/16/20 0800   Wound Type: (c) Other (Comment)  Location: Foot  Location Orientation: Anterior;Left  Present on Admission: No    Assessments 03/16/2020  8:00 AM 04/03/2020  9:00 PM  Dressing Type Other (Comment) --  Dressing Changed Changed --  Dressing Status Old drainage --  Dressing Change Frequency Daily --  Site / Wound Assessment Pink;Red --  Peri-wound Assessment Intact Intact  Drainage Amount Minimal None  Drainage Description Serosanguineous --  Treatment Cleansed --  No Linked orders to display    Procedures:  EEG  Echocardiogram  Tracheostomy  Consultations:  PCCM  Palliative medicine  General surgery  Neurology  Interventional radiology  Physical medicine and rehab   Discharge Exam: Vitals:   04/08/20 0440 04/08/20 0815   BP: (!) 133/92 (!) 145/93  Pulse: 77 69  Resp: 18 17  Temp: 97.7 F (36.5 C) 98.2 F (36.8 C)  SpO2: 96% 98%   Awake severe contractures lower extremity worse than upper, unable to follow commands due to baseline anoxic brain injury [place].AT,PERRAL, tracheostomy site covered with bandage, trach removed on 04/05/2020 Supple Neck,No JVD, No cervical lymphadenopathy appriciated.  Symmetrical Chest wall movement, Good air movement bilaterally, CTAB RRR,No Gallops, Rubs or new Murmurs, No Parasternal Heave +ve B.Sounds, Abd Soft, No tenderness, PEG tube in place No Cyanosis, Clubbing    Discharge Instructions    Allergies as of 04/08/2020      Reactions   Tomato Hives      Medication List    STOP taking these medications   bictegravir-emtricitabine-tenofovir AF 50-200-25 MG Tabs tablet Commonly known as: Biktarvy   erythromycin ophthalmic ointment   HYDROcodone-acetaminophen 5-325 MG tablet Commonly known as: NORCO/VICODIN   oxyCODONE-acetaminophen 5-325 MG tablet Commonly known as: PERCOCET/ROXICET   potassium chloride SA 20 MEQ tablet Commonly known as: KLOR-CON     TAKE these medications   acetaminophen 160 MG/5ML solution Commonly known as: TYLENOL Place 20.3 mLs (650 mg total) into feeding tube every 6 (six) hours.   acetaminophen 160 MG/5ML solution Commonly known as: TYLENOL Place 20.3 mLs (650 mg total) into feeding tube every 6 (six) hours as needed for fever.   amLODipine 10 MG tablet Commonly known as: NORVASC Place 1 tablet (10 mg total) into feeding tube daily.   baclofen 10 MG tablet Commonly known as: LIORESAL Place 3 tablets (30 mg total) into feeding tube 3 (three) times daily.   bisacodyl 10 MG suppository Commonly known as: DULCOLAX Place 1 suppository (10 mg total) rectally daily as needed for severe constipation.   Darunavir-Cobicisctat-Emtricitabine-Tenofovir Alafenamide 800-150-200-10 MG Tabs Commonly known as: SYMTUZA Take 1 tablet by  mouth daily with breakfast.   docusate 50 MG/5ML liquid Commonly known as: COLACE Place 10 mLs (100 mg total) into feeding tube 2 (two) times daily as needed for mild constipation.   famotidine 40 MG/5ML suspension Commonly known as: PEPCID Place 1.3 mLs (10.4 mg total) into feeding tube 2 (two) times daily.   feeding supplement (OSMOLITE 1.5 CAL) Liqd Place 474 mLs into feeding tube 4 (four) times daily.   feeding supplement (PROSource TF) liquid Place 45 mLs into feeding tube daily.   fiber Pack packet Place 1 packet into feeding tube 2 (two) times daily.   folic acid 1 MG tablet Commonly known as: FOLVITE Place 1 tablet (1 mg total) into feeding tube daily.   free water Soln Place 150 mLs into feeding tube every 8 (eight) hours.   gabapentin 250 MG/5ML solution Commonly known as: NEURONTIN Place 4 mLs (200 mg total) into feeding tube every 8 (eight) hours.   levETIRAcetam 100 MG/ML solution Commonly known as: KEPPRA Place 15 mLs (1,500 mg total) into feeding tube 2 (two) times daily.   Muscle Rub 10-15 % Crea Apply 1 application topically 4 (four) times daily.   oxyCODONE 5 MG/5ML solution Commonly known as: ROXICODONE Place 5 mLs (5 mg total) into feeding tube every 6 (six) hours.   polyethylene glycol 17 g packet Commonly known as:  MIRALAX / GLYCOLAX Place 17 g into feeding tube at bedtime.   QUEtiapine 25 MG tablet Commonly known as: SEROQUEL Place 0.5 tablets (12.5 mg total) into feeding tube at bedtime.   scopolamine 1 MG/3DAYS Commonly known as: TRANSDERM-SCOP Place 1 patch (1.5 mg total) onto the skin every 3 (three) days.   sulfamethoxazole-trimethoprim 800-160 MG tablet Commonly known as: BACTRIM DS Place 1 tablet into feeding tube 3 (three) times a week.   thiamine 100 MG tablet Place 1 tablet (100 mg total) into feeding tube daily.   tiZANidine 2 MG tablet Commonly known as: ZANAFLEX Place 3 tablets (6 mg total) into feeding tube every 6  (six) hours.      Allergies  Allergen Reactions  . Tomato Hives    Follow-up Information    [facility] Follow up.   Contact information: [address] [place] [address] [phone]               The results of significant diagnostics from this hospitalization (including imaging, microbiology, ancillary and laboratory) are listed below for reference.    Significant Diagnostic Studies: DG Chest Port 1 View  Result Date: 04/06/2020 CLINICAL DATA:  55 year old male with shortness of breath. EXAM: PORTABLE CHEST 1 VIEW COMPARISON:  None. FINDINGS: The soft tissues of the neck and chin overlie the left pulmonary apex. The heart size and mediastinal contours are within normal limits. Mild bibasilar subsegmental atelectasis. The visualized skeletal structures are unremarkable. IMPRESSION: Bibasilar subsegmental atelectasis. Electronically Signed   By: Marliss Coots MD   On: 04/06/2020 10:17   DG CHEST PORT 1 VIEW  Result Date: 03/18/2020 CLINICAL DATA:  Janina Mayo present.  Cardiac arrest. EXAM: PORTABLE CHEST 1 VIEW COMPARISON:  03/12/2020 FINDINGS: Patient has tracheostomy in place, tip unchanged in position. Heart size is normal accounting for position. Lungs are clear. No edema. IMPRESSION: No evidence for acute  abnormality. Electronically Signed   By: Norva Pavlov M.D.   On: 03/18/2020 08:08   DG CHEST PORT 1 VIEW  Result Date: 03/12/2020 CLINICAL DATA:  Leukocytosis EXAM: PORTABLE CHEST 1 VIEW COMPARISON:  02/14/2020 FINDINGS: Cardiac shadow is enlarged but stable. Tracheostomy tube is noted in satisfactory position. Lungs are well aerated bilaterally. No focal infiltrate or sizable effusion is seen. No bony abnormality is noted. IMPRESSION: No acute abnormality noted. Electronically Signed   By: Alcide Clever M.D.   On: 03/12/2020 08:17    Microbiology: Recent Results (from the past 240 hour(s))  SARS CORONAVIRUS 2 (TAT 6-24  HRS) Nasopharyngeal Nasopharyngeal Swab     Status: None   Collection Time: 04/07/20  5:19 PM   Specimen: Nasopharyngeal Swab  Result Value Ref Range Status   SARS Coronavirus 2 NEGATIVE NEGATIVE Final    Comment: (NOTE) SARS-CoV-2 target nucleic acids are NOT DETECTED.  The SARS-CoV-2 RNA is generally detectable in upper and lower respiratory specimens during the acute phase of infection. Negative results do not preclude SARS-CoV-2 infection, do not rule out co-infections with other pathogens, and should not be used as the sole basis for treatment or other patient management decisions. Negative results must be combined with clinical observations, patient history, and epidemiological information. The expected result is Negative.  Fact Sheet for Patients: HairSlick.no  Fact Sheet for Healthcare Providers: quierodirigir.com  This test is not yet approved or cleared by the [place] FDA and  has been authorized for detection and/or diagnosis of SARS-CoV-2 by FDA under an Emergency Use Authorization (EUA).  This EUA will remain  in effect (meaning this test can be used) for the duration of the COVID-19 declaration under Se ction 564(b)(1) of the Act, 21 U.S.C. section 360bbb-3(b)(1), unless the authorization is terminated or revoked sooner.  Performed at [facility], [address] [place], [place] [address]      Labs: Basic Metabolic Panel: Recent Labs  Lab 04/07/20 0153  NA 142  K 4.2  CL 104  CO2 24  GLUCOSE 92  BUN 15  CREATININE 0.62  CALCIUM 10.3  MG 2.0   Liver Function Tests: No results for input(s): AST, ALT, ALKPHOS, BILITOT, PROT, ALBUMIN in the last 168 hours. No results for input(s): LIPASE, AMYLASE in the last 168 hours. No results for input(s): AMMONIA in the last 168 hours. CBC: Recent Labs  Lab 04/07/20 0153  WBC 3.7*  HGB 13.8  HCT 41.9  MCV 95.2  PLT 142*   Cardiac  Enzymes: No results for input(s): CKTOTAL, CKMB, CKMBINDEX, TROPONINI in the last 168 hours. BNP: BNP (last 3 results) Recent Labs    02/15/20 0433  BNP 3.9    ProBNP (last 3 results) No results for input(s): PROBNP in the last 8760 hours.  CBG: Recent Labs  Lab 04/05/20 2305 04/06/20 0111 04/06/20 1951 04/06/20 2303 04/08/20 0754  GLUCAP 131* 97 161* 97 96       Signed:  Kira Hartl ANP [facility] 04/08/2020, 8:51 AM

## 2020-04-04 NOTE — TOC Progression Note (Addendum)
Transition of Care [facility]) - Progression Note    Patient Details  Name: Ryan Orozco MRN: [id number] Date of Birth: 24-Feb-1965  Transition of Care [facility]) CM/SW Contact  Ryan Bridgeman, RN Phone Number: 04/04/2020, 7:54 AM  Clinical Narrative:    Case management called and spoke with Ryan Orozco, [facility] pharmacist and patient is currently covered for cost of Symtuza through [facility] pharmacy under [employer] enrollment that will continue after patient is discharged to a skilled facility.  I called and spoke with the pharmacist at [facility] on [address] in [place], [place] and that location handles filling of HIV medications.  Ryan Silk, NP was notified and Ryan Orozco will be called into [facility] at [facility] location when patient is able to discharge to [facility] in [place], [place].  Ryan Orozco, MSW called and spoke with Ryan Orozco, CM at [facility] and notified her of the above information to offset the cost of Symtuza at the facility.  Ryan Silk, NP will call in the patient's Symtuza into the [facility] on [facility] to fill the patient's medication prior to discharge to the facility.  Discharge medications to be filled by [facility] pharmacy for 30-day supply for facility.  CM will continue to follow the patient for SNF placement.  04/04/2020 1124 - Ryan Orozco, MSW spoke with Ryan Orozco, CM at [facility] in [place], [place] and the facility is willing to admit the patient to the facility on Monday - transport by PTAR is currently being arranged.  Medications will need to be received from [facility] pharmacy and transported with the patient to the facility.  I spoke with the patient's daughter, Ryan Orozco, on the phone and she is aware that the patient has been accepted for admission at [facility] in [place], [place] and will be transferring to the facility on Monday, 04/04/2020 by PTAR - that is being arranged and transport paid  through LOG arranged by Ryan Orozco, MSW, [facility] supervisor.  04/04/2020 1239 - Spoke with Ryan Silk, NP and COVID screen scheduled for Sunday, 04/07/2020.   Expected Discharge Plan: Skilled Nursing Facility Barriers to Discharge: Continued Medical Work up,SNF Pending bed offer  Expected Discharge Plan and Services Expected Discharge Plan: Skilled Nursing Facility   Discharge Planning Services: CM Consult Post Acute Care Choice: Nursing Home Living arrangements for the past 2 months: Single Family Home                                       Social Determinants of Health (SDOH) Interventions    Readmission Risk Interventions Readmission Risk Prevention Plan 02/06/2020  Transportation Screening Complete  PCP or Specialist Appt within 3-5 Days Complete  HRI or Home Care Consult Complete  Social Work Consult for Recovery Care Planning/Counseling Complete  Palliative Care Screening Complete  Medication Review [employer]) Complete  Some recent data might be hidden

## 2020-04-04 NOTE — Progress Notes (Signed)
Looks good. Has not required sxn. No issues overnight Will see him in am If no issues I plan to decannulate  Simonne Martinet ACNP-BC [facility] Pulmonary/Critical Care Pager # [phone] OR # [phone] if no answer

## 2020-04-05 DIAGNOSIS — Z930 Tracheostomy status: Secondary | ICD-10-CM

## 2020-04-05 LAB — GLUCOSE, CAPILLARY
Glucose-Capillary: 87 mg/dL (ref 70–99)
Glucose-Capillary: 83 mg/dL (ref 70–99)
Glucose-Capillary: 119 mg/dL — ABNORMAL HIGH (ref 70–99)
Glucose-Capillary: 119 mg/dL — ABNORMAL HIGH (ref 70–99)
Glucose-Capillary: 146 mg/dL — ABNORMAL HIGH (ref 70–99)
Glucose-Capillary: 131 mg/dL — ABNORMAL HIGH (ref 70–99)

## 2020-04-05 NOTE — Progress Notes (Signed)
° °  NAMEKeonte Orozco, MRN:  [id number], DOB:  1965/09/20, LOS: 156 ADMISSION DATE:  11/01/2019, CONSULTATION DATE:  9/29 REFERRING MD:  EDP, CHIEF COMPLAINT:  Cardiac arrest   Brief History   55 y/o male with HIV found down outside a convenience store on 9/27, received out of hospital CPR.  After ICU admission has had acute encephalopathy, concern for anoxic brain injury.  Received a tracheostomy 10/13.    Past Medical History  ETOH Abuse HIV - dx 1995  Significant Hospital Events   9/29 Admit post PEA arrest.  UDS positive for THC, benzo's.  ETOH 177 10/05 EEG ongoing. Versed restarted overnight due to agitation. Nicardipine increased.  10/06 Developed tachypnea with WUA, no follow commands off sedation  10/07 Vomiting, TF held / restarted with recurrent vomiting 10/16 tracheostomy collar for 9 hours 10/18 Off vent all night  10/20 ATC 11/15 Trach changed to #4 shiley cuffless 11/22 start capping trials    Interim history/subjective:  Has passed capping  Objective   Blood pressure (Abnormal) 150/86, pulse 82, temperature 97.8 F (36.6 C), temperature source Oral, resp. rate 16, height 5\' 6"  (1.676 m), weight 73.5 kg, SpO2 98 %.    FiO2 (%):  [21 %] 21 %   Intake/Output Summary (Last 24 hours) at 04/05/2020 1319 Last data filed at 04/04/2020 2131 Gross per 24 hour  Intake 474 ml  Output 950 ml  Net -476 ml   Filed Weights   03/24/20 0500 03/25/20 0500 03/30/20 0340  Weight: 73.5 kg 73.5 kg 73.5 kg   Physical Exam: General 55 year old male resting in bed HEENT normocephalic atraumatic mucous membranes moist strong phonation with PMV in place no need for suctioning for last 48 hours Pulmonary: Clear to auscultation Cardiac regular rate and rhythm Extremities warm dry Neuro awake  Resolved Hospital Problem list   Partial small bowel obstruction > resolved  Assessment & Plan:    Tracheostomy dependence in setting  Anoxic brain injury secondary to cardiac arrest  c/b myoclonic seizures  Discussion 55 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 S/p trach due to failure to wean from ventilator.  -Off vent since November. -Down sized to 4 cuffless 2/7 -has tolerated capping for 48hrs   Plan Trach removed.  Keep dressing in place for 24-48 hrs  4/7 ACNP-BC [facility] Pulmonary/Critical Care Pager # [phone] OR # [phone] if no answer

## 2020-04-05 NOTE — Progress Notes (Signed)
Patient did not require suctioning overnight.  O2 sat on mid to high 90s.  2 episodes of loose BM.

## 2020-04-05 NOTE — Plan of Care (Signed)
  Problem: Clinical Measurements: Goal: Will remain free from infection Outcome: Progressing Goal: Diagnostic test results will improve Outcome: Progressing Goal: Respiratory complications will improve Outcome: Progressing Goal: Cardiovascular complication will be avoided Outcome: Progressing   Problem: Activity: Goal: Risk for activity intolerance will decrease Outcome: Progressing   Problem: Nutrition: Goal: Adequate nutrition will be maintained Outcome: Progressing   Problem: Coping: Goal: Level of anxiety will decrease Outcome: Progressing   Problem: Elimination: Goal: Will not experience complications related to bowel motility Outcome: Progressing Goal: Will not experience complications related to urinary retention Outcome: Progressing   Problem: Pain Managment: Goal: General experience of comfort will improve Outcome: Progressing   Problem: Safety: Goal: Ability to remain free from injury will improve Outcome: Progressing   Problem: Skin Integrity: Goal: Risk for impaired skin integrity will decrease Outcome: Progressing   Problem: Education: Goal: 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 Outcome: Progressing   Problem: Activity: Goal: Ability to tolerate increased activity will improve Outcome: Progressing   Problem: Health Behavior/Discharge Planning: Goal: 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 Outcome: Progressing   Problem: Respiratory: Goal: Patent airway maintenance will improve Outcome: Progressing   Problem: Role Relationship: Goal: Ability to communicate will improve Outcome: Progressing   Problem: Education: Goal: Knowledge of the prescribed therapeutic regimen Outcome: Progressing Goal: Knowledge of disease or condition will improve Outcome: Progressing   Problem: Clinical Measurements: Goal: Neurologic status will improve Outcome: Progressing   Problem: Tissue Perfusion: Goal: Ability to maintain  intracranial pressure will improve Outcome: Progressing   Problem: Respiratory: Goal: Will regain and/or maintain adequate ventilation Outcome: Progressing   Problem: Skin Integrity: Goal: Risk for impaired skin integrity will decrease Outcome: Progressing Goal: Demonstration of wound healing without infection will improve Outcome: Progressing   Problem: Psychosocial: Goal: Ability to verbalize positive feelings about self will improve Outcome: Progressing Goal: Ability to participate in self-care as condition permits will improve Outcome: Progressing Goal: Ability to identify appropriate support needs will improve Outcome: Progressing   Problem: Health Behavior/Discharge Planning: Goal: Ability to manage health-related needs will improve Outcome: Progressing   Problem: Nutritional: Goal: Risk of aspiration will decrease Outcome: Progressing Goal: Dietary intake will improve Outcome: Progressing   Problem: Communication: Goal: Ability to communicate needs accurately will improve Outcome: Progressing   

## 2020-04-05 NOTE — Progress Notes (Addendum)
CSW spoke with Pearn at [facility] - patient's trip is scheduled and CSW will call first thing Monday morning to dispatch an ambulance.  LOG contract information was sent to [place] at [facility].   Patient's Medicaid worker is Printmaker @ [place] [employer] - [phone].  Edwin Dada, MSW, LCSW Transitions of Care  Clinical Social Worker II [phone]

## 2020-04-05 NOTE — TOC Progression Note (Signed)
Transition of Care [facility]) - Progression Note    Patient Details  Name: Ryan Orozco MRN: [id number] Date of Birth: 09-14-1965  Transition of Care [facility]) CM/SW Contact  Janae Bridgeman, RN Phone Number: 04/05/2020, 10:52 AM  Clinical Narrative:    Case management obtained his Symtuza medication from the [facility] on [address] and delivered the medication to [place], Licensed conveyancer to lock the prescription up on the nursing unit.  The patient will be discharged by [facility] transport to [facility] in [place], [place] on Monday, 04/08/2020.  The patient has available discharged prescriptions signed, DNR and pending discharge summary ready for discharge packet for Monday's discharge to the facility.  CM and MSW will continue to follow the patient for transfer to the facility.   Expected Discharge Plan: Skilled Nursing Facility Barriers to Discharge: Continued Medical Work up,SNF Pending bed offer  Expected Discharge Plan and Services Expected Discharge Plan: Skilled Nursing Facility   Discharge Planning Services: CM Consult Post Acute Care Choice: Nursing Home Living arrangements for the past 2 months: Single Family Home                                       Social Determinants of Health (SDOH) Interventions    Readmission Risk Interventions Readmission Risk Prevention Plan 02/06/2020  Transportation Screening Complete  PCP or Specialist Appt within 3-5 Days Complete  HRI or Home Care Consult Complete  Social Work Consult for Recovery Care Planning/Counseling Complete  Palliative Care Screening Complete  Medication Review [employer]) Complete  Some recent data might be hidden

## 2020-04-05 NOTE — Progress Notes (Addendum)
[facility] PROGRESS NOTE  Ryan Orozco TXM:[id number] DOB: [birth year] DOA: 11/01/2019 PCP: Default, Provider, MD       12/19/19                          03/15/20 OOB to chair   Status: Remains inpatient appropriate because:Altered mental status, Unsafe d/c plan and Inpatient level of care appropriate due to severity of illness. Patient newly diagnosed with anoxic brain injury  Dispo: The patient is from: Home              Anticipated d/c is to: SNF              Anticipated d/c date is: Monday, March 7              Patient currently is medically stable to d/c.  As of 3/3 has bed offer at [facility] in [place].  Anticipated discharge date Monday 3/7.  Still awaiting approval of Medicaid and disability. HIV meds $4000/mo but covered at no fee thru [employer]). [facility] reviewing clinicals    Code Status: DNR after d/w palliative team on 1/27 Family Communication: 3/3 updated dtr Ryan Orozco via text  DVT prophylaxis: Subcutaneous heparin Vaccination status: Will order first dose Pfizer COVID-vaccine on 1/10; influenza vaccination given 1/9-2nd dose given 2/7  Foley catheter:  No  HPI: 55 year old male with HIV, chronic alcohol abuse who was presented to the emergency department after being found unresponsive outside a convinient store.  EMS found him pulseless in PEA, unknown downtime, successfully resuscitated and brought to the emergency department.  UDS positive for THC and benzodiazepines.  CT head unremarkable.  Failed extubation trials due to severe anoxic brain injury and had tracheostomy placed.  PEG placed on 10/15.  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  This has led to significant pain as well.  [name] was consulted and recommendations/input/orders highly appreciated.  She may also benefit from Botox injections  to both hips to aid in treatment of spasticity.   Significant Hospital Events   9/29 Admit post PEA arrest. UDS positive for THC, benzo's. ETOH 177 10/05 EEG ongoing. Versed restarted overnight due to agitation. Nicardipine increased.  10/06 Developed tachypnea with WUA, no follow commands off sedation  10/07 Vomiting, TF held / restarted with recurrent vomiting 10/16 tracheostomy collar for 9 hours 10/18->10/19 off vent all night  10/20> TC 11/15 > downsized to #4 Shiley flex CFS  Subjective: Awake, alert and smiling.  Objective: Vitals:   04/04/20 1558 04/04/20 2216  BP:  109/88  Pulse: 78 (!) 106  Resp: 16 20  Temp:  97.8 F (36.6 C)  SpO2: 98% 97%    Intake/Output Summary (Last 24 hours) at 04/05/2020 0735 Last data filed at 04/04/2020 2131 Gross per 24 hour  Intake 474 ml  Output 950 ml  Net -476 ml   Filed Weights   03/24/20 0500 03/25/20 0500 03/30/20 0340  Weight: 73.5 kg 73.5 kg 73.5 kg    Exam: General: Awake, pleasant, interactive Pulmonary:  Trach capped.  Tolerating well, has not required suctioning or supplemental oxygen Cardiac: Normal heart sounds, extremities warm to touch with adequate capillary refill, regular nontachycardic pulse Abdomen: Soft nontender nondistended.  PEG tube for feedings and medications.LBM 3/3 Neurological: Severe hypertonicity/spasticity bilateral lower extremities involving the hip flexors and hamstrings;  Keeps bilateral hips flexed secondary to hip flexor contractures essentially  nonpurposeful use of upper extremities.   Psychiatric: Awake, interactive, responds to name but otherwise unable to accurately assess orientation since nonverbal Skin: Wound on dorsum of left foot improving and decreased in size.  No periwound erythema.  Fingernails of both hands trimmed to prevent accidental self injury on 3/1.  Assessment/Plan: Acute problems: Anoxic brain injury 2/2 PEA arrest/pain syndrome secondary to hypertonicity/spastic  tetraplegia:  -Presumed 2/2 combination alcohol and BZD overdose (UDS + for THC and BZDs) -Significant brain injury/spastic tetraplegia with patient now completely bedbound -Continue baclofen, gabapentin, scheduled oxycodone and Zanaflex for spastic tetraplegia; continue Seroquel -1/6 [name] with rehabilitative medicine performed Botox injection into the quadriceps and rectus femoris as well as the biceps femoris and hamstrings, semimembranosus and semitendinosus.  Unable to inject iliopsoas without EMG guidance w/o significant improvement and spasticity hypertonicity.  Unfortunately given lack of improvement he now has bilateral hip flexor contractures -Continue bilateral UE WHO resting splints/PROM q 4 hours/KREG bed -Continue out of bed to chair with lift  Blister dorsum left foot  03/15/20 -Appreciate wound care nurse assistance.   -Continue conservative wound care  Goals of care:  -Palliative care team reconsulted on 1/27 and after an extensive discussion daughter does realize that patient quite debilitated and would not survive a resuscitation therefore patient made a DNR  Chronic respiratory failure with inability to maintain patent airway requiring chronic tracheostomy tube/Recent Corynebacterium tracheobronchitis (resolved) -Continue #4 cuffless trach in place-trach changed out on 1/10 RT -Given altered mentation and inability to manage secretions PCCM states patient is not a candidate for elective decannulation -3/4 DECANNULATED!!!!  HIV:  -CD4 count of 124 October 2021 (had been as high as 250 Dec 2020)-as of today up to 259; viral load obtained on 1/15 was <20 -Patient will be able to continue Symtuza after discharge (was on [place] prior to admission).  He is enrolled in the [employer] drug assistance program through [employer].  Initial prescription will be sent to local [facility] and sent with patient upon discharge.  Local pharmacy in [place]/same  location SNF confirmed that they have the medication and except [place] drug assistance medications -CD4 count on 12/23 up to 259 -ID recommends continuing prophylactic Bactrim an additional 3 months (last dose 04/24/2020)  Dysphagia 2/2 anoxic brain injury -Continue tube feeding per PEG -Given altered mentation patient not a candidate for further speech evaluations and will be permanently dependent on enteral tube feedings  Constipation -We will continue scheduled Colace, fiber supplements per tube as well as daily MiraLAX -Suspect requirement for narcotics and multiple muscle relaxers contributing to decreased GI motility and constipation symptoms  Moderate Protein calorie malnutrition nutrition Status: Nutrition Problem: Moderate Malnutrition Etiology: chronic illness Signs/Symptoms: mild fat depletion,mild muscle depletion Interventions: Tube feeding bolus doses of Osmolite 1.5 2 cartons, Juven twice daily, Prosource TF 45 mL 3 times daily and Nutrisouce fiber BID and free water 150 cc q 8 hrs Estimated body mass index is 26.15 kg/m as calculated from the following:   Height as of this encounter: _0  (1.676 m).   Weight as of this encounter: 73.5 kg.   Pressure area dorsum left foot not POA Wound / Incision (Open or Dehisced) 11/17/19 Puncture Abdomen Left;Anterior;Upper G-tube insertion site  (Active)  Date First Assessed/Time First Assessed: 11/17/19 1646   Wound Type: Puncture  Location: Abdomen  Location Orientation: Left;Anterior;Upper  Wound Description (Comments): G-tube insertion site   Present on Admission: No    Assessments 11/17/2019  4:47 PM  04/04/2020  9:17 AM  Dressing Type Gauze (Comment);Tape dressing Gauze (Comment);Tape dressing  Dressing Changed New --  Dressing Status Clean;Dry;Intact Clean;Intact;Dry  Dressing Change Frequency PRN --  Site / Wound Assessment Clean;Dry;Pink Clean;Dry  Peri-wound Assessment Intact Intact  Closure None --  Drainage Amount None  --  Treatment Cleansed --     No Linked orders to display     Wound / Incision (Open or Dehisced) 03/16/20 Other (Comment) Foot Anterior;Left (Active)  Date First Assessed/Time First Assessed: 03/16/20 0800   Wound Type: (c) Other (Comment)  Location: Foot  Location Orientation: Anterior;Left  Present on Admission: No    Assessments 03/16/2020  8:00 AM 04/05/2020  5:00 AM  Dressing Type Other (Comment) Gauze (Comment)  Dressing Changed Changed Changed  Dressing Status Old drainage Old drainage  Dressing Change Frequency Daily Daily  Site / Wound Assessment Pink;Red Clean;Dry  Peri-wound Assessment Intact --  Drainage Amount Minimal --  Drainage Description Serosanguineous --  Treatment Cleansed --     No Linked orders to display      Other problems: Hypertension/grade 1 diastolic dysfunction:  -Continue amlodipine  -Echocardiogram this admission with preserved LVEF with evidence of grade   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   -Continue Keppra; levetiracetam level 35.9 on 11/12 -no further seizure activity since transitioned out of ICU setting therefore will discontinue seizure precaution  SIRS/Fever 2/2 recurrent enterococcus UTI -Patient developed mild elevation in temperature overnight to 1/13 associated with slight increase in respiratory secretions and congested sounding cough/abnormal pulmonary exam as well as tachycardia./14 respiratory status much improved and no further fevers. -Respiratory viral panel neg for COVID, influenza and RSV.   -UA slightly abnormal/cx c/w enterococcus UTI- started on nitrofurantoin (prior UTI treated w/ augmentin) has completed 3 days of treatment -X-ray with bilateral opacities concerning for early pneumonia but CT of the chest without pneumonia, broad-spectrum antibiotics dc'd 1/14 -Because of dehydration increased frequency of free water from 200 cc every 4 hours to every 2 hours on 1/13- stopped on  1/15  Fever/peri-PEG drainage positive for corynebacterium and rare Pseudomonas -PEG site with some drainage but no erythema, induration or pain -Suspect may have colonization noting recently had corynebacterium tracheobronchitis treated -continue bacitracin ointment  Low-grade fever/abnormal urinalysis -2/8: Solitary T-max 100.5 overnight.  Likely related to atelectasis.  Obtain chest x-ray today that was unremarkable -As a precaution we will go ahead and obtain urinalysis and culture and blood cultures; electrolytes unremarkable, LDH normal at 178, no leukocytosis -Urinalysis abnormal with hazy appearance, trace leukocytes, rare bacteria and 11-20 WBCs-urine culture was never obtained and subsequently fever has resolved -Blood cultures are negative -Chest x-ray obtained on 2/14 unremarkable  Inguinal hernias -Evaluated by general surgery this admission. Documented as chronically incarcerated. Patient has been clinically stable and tolerating tube feedings and having bowel movements so unless patient obstructed would not be considered a candidate for repair. Even if obstructed consulting surgeon was not sure he would be a candidate for hernia repair regardless  Constipation -No documented bowel movement since 1/13 -We will continue scheduled Colace, fiber supplements per tube -Add daily MiraLAX -Given one-time dose milk of magnesia on 1/20-no documented bowel movement on flowsheet -Suspect requirement for narcotics and multiple muscle relaxers contributing to decreased GI motility and constipation symptoms  Goals of care:  -Palliative care was involved during this hospitalization.   -Goals of care were discussed. remains full code.  -Palliative care recommended outpatient follow-up with palliative providers. -Reconsult Palliative medicine 1/27  Data Reviewed: Basic Metabolic Panel: Recent Labs  Lab 03/31/20 0144  NA 140  K 4.1  CL 101  CO2 28  GLUCOSE 90  BUN 18   CREATININE 0.71  CALCIUM 10.7*  MG 1.9   Liver Function Tests: Recent Labs  Lab 03/31/20 0144  AST 21  ALT 18  ALKPHOS 92  BILITOT 0.4  PROT 7.7  ALBUMIN 3.6   No results for input(s): LIPASE, AMYLASE in the last 168 hours. No results for input(s): AMMONIA in the last 168 hours. CBC: Recent Labs  Lab 03/31/20 0144  WBC 4.7  NEUTROABS 2.9  HGB 13.7  HCT 41.8  MCV 94.4  PLT 142*   Cardiac Enzymes: No results for input(s): CKTOTAL, CKMB, CKMBINDEX, TROPONINI in the last 168 hours. BNP (last 3 results) Recent Labs    02/15/20 0433  BNP 3.9    ProBNP (last 3 results) No results for input(s): PROBNP in the last 8760 hours.  CBG: Recent Labs  Lab 04/04/20 0804 04/04/20 1128 04/04/20 1620 04/04/20 1945 04/04/20 2335  GLUCAP 92 129* 91 183* 126*    No results found for this or any previous visit (from the past 240 hour(s)).   Studies: No results found.  Scheduled Meds: . acetaminophen (TYLENOL) oral liquid 160 mg/5 mL  650 mg Per Tube Q6H  . amLODipine  10 mg Per Tube Daily  . bacitracin   Topical BID  . baclofen  30 mg Per Tube TID  . chlorhexidine gluconate (MEDLINE KIT)  15 mL Mouth Rinse BID  . Darunavir-Cobicisctat-Emtricitabine-Tenofovir Alafenamide  1 tablet Oral Q breakfast  . famotidine  10.4 mg Per Tube BID  . feeding supplement (OSMOLITE 1.5 CAL)  474 mL Per Tube QID  . feeding supplement (PROSource TF)  45 mL Per Tube Daily  . fiber  1 packet Per Tube BID  . folic acid  1 mg Per Tube Daily  . free water  150 mL Per Tube Q8H  . gabapentin  200 mg Per Tube Q8H  . heparin injection (subcutaneous)  5,000 Units Subcutaneous Q8H  . levETIRAcetam  1,500 mg Per Tube BID  . magic mouthwash w/lidocaine  5 mL Oral QID  . Muscle Rub   Topical QID  . oxyCODONE  5 mg Per Tube Q6H  . polyethylene glycol  17 g Per Tube QHS  . QUEtiapine  12.5 mg Per Tube QHS  . scopolamine  1 patch Transdermal Q72H  . sulfamethoxazole-trimethoprim  1 tablet Per Tube  Once per day on Mon Wed Fri  . thiamine  100 mg Per Tube Daily  . tiZANidine  6 mg Per Tube Q6H   Continuous Infusions:   Active Problems:   Cardiac arrest [facility])   Community acquired pneumonia of left upper lobe of lung   Anoxic brain injury ([place])   Alcohol abuse   Acute respiratory failure with hypoxia (HCC)   Vomiting   Pressure injury of skin   Small bowel obstruction (HCC)   Palliative care encounter   Bowel obstruction (HCC)   Chronic respiratory failure (HCC)   Diastolic dysfunction   Tracheostomy status ([place])   Dysphagia   Protein-calorie malnutrition (HCC)   Seizures (HCC)   Bilateral recurrent inguinal hernia   Muscle spasticity   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HCC)   Tracheobronchitis   Sequela of Corynebacterium infection   Abnormal urine   Urinary tract infection due to Enterococcus   Fever   Malnutrition of moderate degree   Consultants:  PCCM  Palliative medicine  General surgery  Neurology  Interventional radiology  Procedures:  EEG  Echocardiogram  Tracheostomy  Antibiotics: Anti-infectives (From admission, onward)   Start     Dose/Rate Route Frequency Ordered Stop   04/05/20 0000  sulfamethoxazole-trimethoprim (BACTRIM DS) 800-160 MG tablet        1 tablet Per Tube 3 times weekly 04/04/20 1049     04/04/20 0000  Darunavir-Cobicisctat-Emtricitabine-Tenofovir Alafenamide (SYMTUZA) 800-150-200-10 MG TABS        1 tablet Oral Daily with breakfast 04/04/20 1049     02/17/20 1100  nitrofurantoin (FURADANTIN) 25 MG/5ML suspension 100 mg        100 mg Per Tube Every 12 hours 02/17/20 1006 02/20/20 0924   02/15/20 1000  vancomycin (VANCOCIN) IVPB 1000 mg/200 mL premix  Status:  Discontinued        1,000 mg 200 mL/hr over 60 Minutes Intravenous Every 12 hours 02/14/20 2226 02/16/20 0742   02/14/20 2245  piperacillin-tazobactam (ZOSYN) IVPB 3.375 g  Status:  Discontinued        3.375 g 12.5 mL/hr over 240 Minutes Intravenous  Every 8 hours 02/14/20 2226 02/16/20 0742   02/14/20 2245  vancomycin (VANCOREADY) IVPB 1500 mg/300 mL        1,500 mg 150 mL/hr over 120 Minutes Intravenous  Once 02/14/20 2226 02/15/20 0739   02/01/20 2200  amoxicillin-clavulanate (AUGMENTIN) 250-62.5 MG/5ML suspension 500 mg        500 mg Per Tube Every 8 hours 02/01/20 1343 02/06/20 1333   01/31/20 1015  levofloxacin (LEVAQUIN) 25 MG/ML solution 500 mg  Status:  Discontinued        500 mg Per Tube Daily 01/31/20 0919 02/01/20 1343   01/31/20 1000  cefTRIAXone (ROCEPHIN) 1 g in sodium chloride 0.9 % 100 mL IVPB  Status:  Discontinued        1 g 200 mL/hr over 30 Minutes Intravenous Every 24 hours 01/31/20 0905 01/31/20 0919   01/19/20 2200  linezolid (ZYVOX) tablet 600 mg        600 mg Per Tube Every 12 hours 01/19/20 1534 01/25/20 2019   01/19/20 1100  levofloxacin (LEVAQUIN) tablet 500 mg  Status:  Discontinued        500 mg Per Tube Daily 01/19/20 1004 01/19/20 1534   12/19/19 0930  Darunavir-Cobicisctat-Emtricitabine-Tenofovir Alafenamide (SYMTUZA) 800-150-200-10 MG TABS 1 tablet        1 tablet Oral Daily with breakfast 12/19/19 0840     12/13/19 1000  bictegravir-emtricitabine-tenofovir AF (BIKTARVY) 50-200-25 MG per tablet 1 tablet  Status:  Discontinued        1 tablet Oral Daily 12/12/19 1413 12/19/19 0840   11/17/19 1548  ceFAZolin (ANCEF) 2-4 GM/100ML-% IVPB       Note to Pharmacy: Domenick Bookbinder   : cabinet override      11/17/19 1548 11/18/19 0359   11/16/19 1515  ceFAZolin (ANCEF) IVPB 2g/100 mL premix        2 g 200 mL/hr over 30 Minutes Intravenous To Radiology 11/16/19 1511 11/17/19 1625   11/15/19 0900  sulfamethoxazole-trimethoprim (BACTRIM DS) 800-160 MG per tablet 1 tablet        1 tablet Per Tube Once per day on Mon Wed Fri 11/13/19 1906     11/13/19 1615  dolutegravir (TIVICAY) tablet 50 mg  Status:  Discontinued        50 mg Oral Daily 11/13/19 1521 12/12/19 1413   11/13/19 1615  emtricitabine-tenofovir AF  (DESCOVY) 200-25 MG per  tablet 1 tablet  Status:  Discontinued        1 tablet Per Tube Daily 11/13/19 1521 12/12/19 1413   11/13/19 1530  sulfamethoxazole-trimethoprim (BACTRIM DS) 800-160 MG per tablet 1 tablet  Status:  Discontinued        1 tablet Oral Once per day on Mon Wed Fri 11/13/19 1441 11/13/19 1906   11/13/19 1500  bictegravir-emtricitabine-tenofovir AF (BIKTARVY) 50-200-25 MG per tablet 1 tablet  Status:  Discontinued        1 tablet Oral Daily 11/13/19 1403 11/13/19 1521   11/10/19 1000  erythromycin 250 mg in sodium chloride 0.9 % 100 mL IVPB        250 mg 100 mL/hr over 60 Minutes Intravenous Every 8 hours 11/10/19 0907 11/11/19 2000   11/07/19 1200  ceFEPIme (MAXIPIME) 2 g in sodium chloride 0.9 % 100 mL IVPB        2 g 200 mL/hr over 30 Minutes Intravenous Every 8 hours 11/07/19 1013 11/13/19 2127   11/03/19 1200  cefTRIAXone (ROCEPHIN) 2 g in sodium chloride 0.9 % 100 mL IVPB        2 g 200 mL/hr over 30 Minutes Intravenous Every 24 hours 11/03/19 0922 11/05/19 1427   11/02/19 0800  vancomycin (VANCOREADY) IVPB 750 mg/150 mL  Status:  Discontinued        750 mg 150 mL/hr over 60 Minutes Intravenous Every 12 hours 11/01/19 1935 11/02/19 1105   11/02/19 0600  piperacillin-tazobactam (ZOSYN) IVPB 3.375 g  Status:  Discontinued        3.375 g 12.5 mL/hr over 240 Minutes Intravenous Every 8 hours 11/02/19 0311 11/03/19 0920   11/01/19 2200  ceFEPIme (MAXIPIME) 2 g in sodium chloride 0.9 % 100 mL IVPB  Status:  Discontinued        2 g 200 mL/hr over 30 Minutes Intravenous Every 12 hours 11/01/19 2143 11/02/19 0311   11/01/19 1915  vancomycin (VANCOREADY) IVPB 1500 mg/300 mL        1,500 mg 150 mL/hr over 120 Minutes Intravenous  Once 11/01/19 1909 11/01/19 2147   11/01/19 1845  cefTRIAXone (ROCEPHIN) 1 g in sodium chloride 0.9 % 100 mL IVPB        1 g 200 mL/hr over 30 Minutes Intravenous  Once 11/01/19 1842 11/01/19 2051   11/01/19 1845  azithromycin (ZITHROMAX) 500 mg in  sodium chloride 0.9 % 250 mL IVPB        500 mg 250 mL/hr over 60 Minutes Intravenous  Once 11/01/19 1842 11/01/19 2051       Time spent: 20 minutes    Erin Hearing ANP  [facility]  7 am-330 pm/M-F for direct patient care and secure chat Please refer to [place] for contact information 04/05/2020, 7:35 AM  LOS: 156 days

## 2020-04-06 ENCOUNTER — Inpatient Hospital Stay (HOSPITAL_COMMUNITY): Payer: Medicaid Other

## 2020-04-06 LAB — GLUCOSE, CAPILLARY
Glucose-Capillary: 97 mg/dL (ref 70–99)
Glucose-Capillary: 161 mg/dL — ABNORMAL HIGH (ref 70–99)
Glucose-Capillary: 97 mg/dL (ref 70–99)

## 2020-04-06 NOTE — Plan of Care (Signed)
  Problem: Clinical Measurements: Goal: Will remain free from infection Outcome: Progressing Goal: Diagnostic test results will improve Outcome: Progressing Goal: Respiratory complications will improve Outcome: Progressing Goal: Cardiovascular complication will be avoided Outcome: Progressing   Problem: Activity: Goal: Risk for activity intolerance will decrease Outcome: Progressing   Problem: Nutrition: Goal: Adequate nutrition will be maintained Outcome: Progressing   Problem: Coping: Goal: Level of anxiety will decrease Outcome: Progressing   Problem: Elimination: Goal: Will not experience complications related to bowel motility Outcome: Progressing Goal: Will not experience complications related to urinary retention Outcome: Progressing   Problem: Pain Managment: Goal: General experience of comfort will improve Outcome: Progressing   Problem: Safety: Goal: Ability to remain free from injury will improve Outcome: Progressing   Problem: Skin Integrity: Goal: Risk for impaired skin integrity will decrease Outcome: Progressing   Problem: Education: Goal: 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 Outcome: Progressing   Problem: Activity: Goal: Ability to tolerate increased activity will improve Outcome: Progressing   Problem: Health Behavior/Discharge Planning: Goal: 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 Outcome: Progressing   Problem: Respiratory: Goal: Patent airway maintenance will improve Outcome: Progressing   Problem: Role Relationship: Goal: Ability to communicate will improve Outcome: Progressing   Problem: Education: Goal: Knowledge of the prescribed therapeutic regimen Outcome: Progressing Goal: Knowledge of disease or condition will improve Outcome: Progressing   Problem: Clinical Measurements: Goal: Neurologic status will improve Outcome: Progressing   Problem: Tissue Perfusion: Goal: Ability to maintain  intracranial pressure will improve Outcome: Progressing   Problem: Respiratory: Goal: Will regain and/or maintain adequate ventilation Outcome: Progressing   Problem: Skin Integrity: Goal: Risk for impaired skin integrity will decrease Outcome: Progressing Goal: Demonstration of wound healing without infection will improve Outcome: Progressing   Problem: Psychosocial: Goal: Ability to verbalize positive feelings about self will improve Outcome: Progressing Goal: Ability to participate in self-care as condition permits will improve Outcome: Progressing Goal: Ability to identify appropriate support needs will improve Outcome: Progressing   Problem: Health Behavior/Discharge Planning: Goal: Ability to manage health-related needs will improve Outcome: Progressing   Problem: Nutritional: Goal: Risk of aspiration will decrease Outcome: Progressing Goal: Dietary intake will improve Outcome: Progressing   Problem: Communication: Goal: Ability to communicate needs accurately will improve Outcome: Progressing   

## 2020-04-06 NOTE — Progress Notes (Signed)
[facility] PROGRESS NOTE  Ryan Orozco YTK:[id number] DOB: 01-29-1966 DOA: 11/01/2019 PCP: Default, Provider, MD       12/19/19                          03/15/20 OOB to chair   Status: Remains inpatient appropriate because:Altered mental status, Unsafe d/c plan and Inpatient level of care appropriate due to severity of illness. Patient newly diagnosed with anoxic brain injury  Dispo: The patient is from: Home              Anticipated d/c is to: SNF              Anticipated d/c date is: Monday, March 7              Patient currently is medically stable to d/c.  As of 3/3 has bed offer at [facility] in [place].  Anticipated discharge date Monday 3/7.  Still awaiting approval of Medicaid and disability. HIV meds $4000/mo but covered at no fee thru [employer]). [facility] reviewing clinicals    Code Status: DNR after d/w palliative team on 1/27 Family Communication: 3/3 updated dtr Ryan Orozco via text  DVT prophylaxis: Subcutaneous heparin Vaccination status: Will order first dose Pfizer COVID-vaccine on 1/10; influenza vaccination given 1/9-2nd dose given 2/7  Foley catheter: No  HPI:  55 year old male with HIV, chronic alcohol abuse who was presented to the emergency department after being found unresponsive outside a convinient store.  EMS found him pulseless in PEA, unknown downtime, successfully resuscitated and brought to the emergency department.  UDS positive for THC and benzodiazepines.  CT head unremarkable.  Failed extubation trials due to severe anoxic brain injury and had tracheostomy placed.  PEG placed on 10/15.  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  This has led to significant pain as well.  Dr. Franchot Orozco was consulted and recommendations/input/orders highly appreciated.  She may also benefit from Botox  injections to both hips to aid in treatment of spasticity.   Significant Hospital Events   9/29 Admit post PEA arrest. UDS positive for THC, benzo's. ETOH 177 10/05 EEG ongoing. Versed restarted overnight due to agitation. Nicardipine increased.  10/06 Developed tachypnea with WUA, no follow commands off sedation  10/07 Vomiting, TF held / restarted with recurrent vomiting 10/16 tracheostomy collar for 9 hours 10/18->10/19 off vent all night  10/20> TC 11/15 > downsized to #4 Shiley flex CFS 04/05/2020.  Tracheostomy tube removed by pulmonary critical care  Subjective: And is awake, appears to be in no distress, unable to answer questions or follow commands due to severe anoxic brain injury  Objective: Vitals:   04/05/20 2015 04/06/20 0656  BP:  110/63  Pulse:  65  Resp:  20  Temp:  98.1 F (36.7 C)  SpO2: 98% 98%    Intake/Output Summary (Last 24 hours) at 04/06/2020 0826 Last data filed at 04/06/2020 0650 Gross per 24 hour  Intake -  Output 1050 ml  Net -1050 ml   Filed Weights   03/30/20 0340 04/05/20 2015 04/06/20 0554  Weight: 73.5 kg 74.5 kg 74 kg    Exam:  Awake severe contractures lower extremity worse than upper, unable to follow commands due to baseline anoxic brain injury [place].AT,PERRAL, tracheostomy site covered with bandage, trach removed on 04/05/2020 Supple Neck,No JVD, No cervical lymphadenopathy appriciated.  Symmetrical Chest wall movement, Good air  movement bilaterally, CTAB RRR,No Gallops, Rubs or new Murmurs, No Parasternal Heave +ve B.Sounds, Abd Soft, No tenderness, PEG tube in place No Cyanosis, Clubbing or edema, No new Rash or bruise   Assessment/Plan: Acute problems:  Anoxic brain injury 2/2 PEA arrest/pain syndrome secondary to hypertonicity/spastic tetraplegia:  -Presumed 2/2 combination alcohol and BZD overdose (UDS + for THC and BZDs) -Significant brain injury/spastic tetraplegia with patient now completely bedbound -Continue baclofen,  gabapentin, scheduled oxycodone and Zanaflex for spastic tetraplegia; continue Seroquel -1/6 Dr. Naaman Orozco with rehabilitative medicine performed Botox injection into the quadriceps and rectus femoris as well as the biceps femoris and hamstrings, semimembranosus and semitendinosus.  Unable to inject iliopsoas without EMG guidance w/o significant improvement and spasticity hypertonicity.  Unfortunately given lack of improvement he now has bilateral hip flexor contractures -Continue bilateral UE WHO resting splints/PROM q 4 hours/KREG bed -Continue out of bed to chair with lift, continue PT/OT at SNF.   Chronic respiratory failure with inability to maintain patent airway requiring chronic tracheostomy tube/Recent Corynebacterium tracheobronchitis (resolved) -Continue #4 cuffless trach in place-trach changed out on 1/10 RT -Given altered mentation and inability to manage secretions PCCM states patient is not a candidate for elective decannulation -3/4 DECANNULATED!!!!  HIV:  -CD4 count of 124 October 2021 (had been as high as 250 Dec 2020)-as of today up to 259; viral load obtained on 1/15 was <20 -Patient will be able to continue Symtuza after discharge (was on [place] prior to admission).  He is enrolled in the [employer] drug assistance program through [employer].  Initial prescription will be sent to local [facility] and sent with patient upon discharge.  Local pharmacy in [place]/same location SNF confirmed that they have the medication and except [facility] [employer] drug assistance medications -CD4 count on 12/23 up to 259 -ID recommends continuing prophylactic Bactrim an additional 3 months (last dose 04/24/2020) Follow with ID post discharge in 1 to 2 weeks.  Dysphagia 2/2 anoxic brain injury - Continue tube feeding per PEG   Moderate Protein calorie malnutrition nutrition Status:  has a PEG tube, tube feeds as below were given here, SNF can adjust as needed - Tube feeding bolus  doses of Osmolite 1.5 2 cartons, Juven twice daily, Prosource TF 45 mL 3 times daily and Nutrisouce fiber BID and free water 150 cc q 8 hrs. Obtain dietary consult as soon as he arrives to SNF.   Hypertension/grade 1 diastolic dysfunction: Stable on Norvasc.  Echo shows a preserved EF.   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    Continue Keppra.  No further seizure activity  SIRS/Fever 2/2 recurrent enterococcus UTI - adequately treated earlier this admission now appears infection free.  Fever/peri-PEG drainage positive for corynebacterium and rare Pseudomonas - continue bacitracin ointment, keep site clean and dry  Inguinal hernias -Evaluated by general surgery this admission.  Supportive care only.  Poor candidate for surgery.  Constipation -Stable on present bowel regimen  Goals of care: Seen by palliative care, now DNR.  Blister dorsum left foot  03/15/20 -Appreciate wound care nurse assistance.   -Continue conservative wound care  Pressure area dorsum left foot not POA Wound / Incision (Open or Dehisced) 11/17/19 Puncture Abdomen Left;Anterior;Upper G-tube insertion site  (Active)  Date First Assessed/Time First Assessed: 11/17/19 1646   Wound Type: Puncture  Location: Abdomen  Location Orientation: Left;Anterior;Upper  Wound Description (Comments): G-tube insertion site   Present on Admission: No    Assessments 11/17/2019  4:47 PM 04/05/2020  8:00 PM  Dressing Type Gauze (Comment);Tape dressing Gauze (Comment)  Dressing Changed New -  Dressing Status Clean;Dry;Intact Clean;Dry;Intact  Dressing Change Frequency PRN -  Site / Wound Assessment Clean;Dry;Pink -  Peri-wound Assessment Intact -  Closure None -  Drainage Amount None -  Treatment Cleansed -     No Linked orders to display     Wound / Incision (Open or Dehisced) 03/16/20 Other (Comment) Foot Anterior;Left (Active)  Date First Assessed/Time First Assessed: 03/16/20 0800   Wound Type: (c) Other  (Comment)  Location: Foot  Location Orientation: Anterior;Left  Present on Admission: No    Assessments 03/16/2020  8:00 AM 04/05/2020  5:00 AM  Dressing Type Other (Comment) Gauze (Comment)  Dressing Changed Changed Changed  Dressing Status Old drainage Old drainage  Dressing Change Frequency Daily Daily  Site / Wound Assessment Pink;Red Clean;Dry  Peri-wound Assessment Intact -  Drainage Amount Minimal -  Drainage Description Serosanguineous -  Treatment Cleansed -     No Linked orders to display      Data Reviewed: Basic Metabolic Panel: Recent Labs  Lab 03/31/20 0144  NA 140  K 4.1  CL 101  CO2 28  GLUCOSE 90  BUN 18  CREATININE 0.71  CALCIUM 10.7*  MG 1.9   Liver Function Tests: Recent Labs  Lab 03/31/20 0144  AST 21  ALT 18  ALKPHOS 92  BILITOT 0.4  PROT 7.7  ALBUMIN 3.6   No results for input(s): LIPASE, AMYLASE in the last 168 hours. No results for input(s): AMMONIA in the last 168 hours. CBC: Recent Labs  Lab 03/31/20 0144  WBC 4.7  NEUTROABS 2.9  HGB 13.7  HCT 41.8  MCV 94.4  PLT 142*   Cardiac Enzymes: No results for input(s): CKTOTAL, CKMB, CKMBINDEX, TROPONINI in the last 168 hours. BNP (last 3 results) Recent Labs    02/15/20 0433  BNP 3.9    ProBNP (last 3 results) No results for input(s): PROBNP in the last 8760 hours.  CBG: Recent Labs  Lab 04/05/20 1151 04/05/20 1613 04/05/20 1912 04/05/20 2305 04/06/20 0111  GLUCAP 119* 119* 146* 131* 97    No results found for this or any previous visit (from the past 240 hour(s)).   Studies: No results found.  Scheduled Meds: . acetaminophen (TYLENOL) oral liquid 160 mg/5 mL  650 mg Per Tube Q6H  . amLODipine  10 mg Per Tube Daily  . bacitracin   Topical BID  . baclofen  30 mg Per Tube TID  . chlorhexidine gluconate (MEDLINE KIT)  15 mL Mouth Rinse BID  . Darunavir-Cobicisctat-Emtricitabine-Tenofovir Alafenamide  1 tablet Oral Q breakfast  . famotidine  10.4 mg Per Tube BID   . feeding supplement (OSMOLITE 1.5 CAL)  474 mL Per Tube QID  . feeding supplement (PROSource TF)  45 mL Per Tube Daily  . fiber  1 packet Per Tube BID  . folic acid  1 mg Per Tube Daily  . free water  150 mL Per Tube Q8H  . gabapentin  200 mg Per Tube Q8H  . heparin injection (subcutaneous)  5,000 Units Subcutaneous Q8H  . levETIRAcetam  1,500 mg Per Tube BID  . magic mouthwash w/lidocaine  5 mL Oral QID  . Muscle Rub   Topical QID  . oxyCODONE  5 mg Per Tube Q6H  . polyethylene glycol  17 g Per Tube QHS  . QUEtiapine  12.5 mg Per Tube QHS  . scopolamine  1 patch Transdermal Q72H  .  sulfamethoxazole-trimethoprim  1 tablet Per Tube Once per day on Mon Wed Fri  . thiamine  100 mg Per Tube Daily  . tiZANidine  6 mg Per Tube Q6H   Continuous Infusions:   Active Problems:   Cardiac arrest [facility])   Community acquired pneumonia of left upper lobe of lung   Anoxic brain injury ([place])   Alcohol abuse   Acute respiratory failure with hypoxia (HCC)   Vomiting   Pressure injury of skin   Small bowel obstruction (HCC)   Palliative care encounter   Bowel obstruction (HCC)   Chronic respiratory failure (HCC)   Diastolic dysfunction   Tracheostomy status (HCC)   Dysphagia   Protein-calorie malnutrition (HCC)   Seizures (HCC)   Bilateral recurrent inguinal hernia   Muscle spasticity   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HCC)   Tracheobronchitis   Sequela of Corynebacterium infection   Abnormal urine   Urinary tract infection due to Enterococcus   Fever   Malnutrition of moderate degree   Tracheostomy dependent [facility])   Consultants:  PCCM  Palliative medicine  General surgery  Neurology  Interventional radiology  Procedures:  EEG  Echocardiogram  Tracheostomy    Time spent: 20 minutes Signature  Lala Lund M.D on 04/06/2020 at 8:26 AM   -  To page go to [email or web address]      04/06/2020, 8:26 AM  LOS: 157 days

## 2020-04-06 NOTE — Progress Notes (Signed)
Provider notified pt POCT blood glucose = 161

## 2020-04-07 LAB — CBC
HCT: 41.9 % (ref 39.0–52.0)
Hemoglobin: 13.8 g/dL (ref 13.0–17.0)
MCH: 31.4 pg (ref 26.0–34.0)
MCHC: 32.9 g/dL (ref 30.0–36.0)
MCV: 95.2 fL (ref 80.0–100.0)
Platelets: 142 10*3/uL — ABNORMAL LOW (ref 150–400)
RBC: 4.4 MIL/uL (ref 4.22–5.81)
RDW: 13.3 % (ref 11.5–15.5)
WBC: 3.7 10*3/uL — ABNORMAL LOW (ref 4.0–10.5)
nRBC: 0 % (ref 0.0–0.2)

## 2020-04-07 LAB — BASIC METABOLIC PANEL
Anion gap: 14 (ref 5–15)
BUN: 15 mg/dL (ref 6–20)
CO2: 24 mmol/L (ref 22–32)
Calcium: 10.3 mg/dL (ref 8.9–10.3)
Chloride: 104 mmol/L (ref 98–111)
Creatinine, Ser: 0.62 mg/dL (ref 0.61–1.24)
GFR, Estimated: >60 mL/min (ref >60)
Glucose, Bld: 92 mg/dL (ref 70–99)
Potassium: 4.2 mmol/L (ref 3.5–5.1)
Sodium: 142 mmol/L (ref 135–145)

## 2020-04-07 LAB — SARS CORONAVIRUS 2 (TAT 6-24 HRS): SARS Coronavirus 2: NEGATIVE

## 2020-04-07 LAB — MAGNESIUM: Magnesium: 2 mg/dL (ref 1.7–2.4)

## 2020-04-07 NOTE — Progress Notes (Signed)
[facility] PROGRESS NOTE  Ryan Orozco NTI:[id number] DOB: [birth year] DOA: 11/01/2019 PCP: Default, Provider, MD       12/19/19                          03/15/20 OOB to chair   Status: Remains inpatient appropriate because:Altered mental status, Unsafe d/c plan and Inpatient level of care appropriate due to severity of illness. Patient newly diagnosed with anoxic brain injury  Dispo: The patient is from: Home              Anticipated d/c is to: SNF              Anticipated d/c date is: Monday, March 7              Patient currently is medically stable to d/c.  As of 3/3 has bed offer at [facility] in [place].  Anticipated discharge date Monday 3/7.  Still awaiting approval of Medicaid and disability. HIV meds $4000/mo but covered at no fee thru [employer]). [facility] reviewing clinicals    Code Status: DNR after d/w palliative team on 1/27 Family Communication: 3/3 updated dtr Ryan Orozco via text  DVT prophylaxis: Subcutaneous heparin Vaccination status: Will order first dose Pfizer COVID-vaccine on 1/10; influenza vaccination given 1/9-2nd dose given 2/7  Foley catheter: No  HPI:  56 year old male with HIV, chronic alcohol abuse who was presented to the emergency department after being found unresponsive outside a convinient store.  EMS found him pulseless in PEA, unknown downtime, successfully resuscitated and brought to the emergency department.  UDS positive for THC and benzodiazepines.  CT head unremarkable.  Failed extubation trials due to severe anoxic brain injury and had tracheostomy placed.  PEG placed on 10/15.  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  This has led to significant pain as well.  [name] was consulted and recommendations/input/orders highly appreciated.  She may also benefit from Botox  injections to both hips to aid in treatment of spasticity.   Significant Hospital Events   9/29 Admit post PEA arrest. UDS positive for THC, benzo's. ETOH 177 10/05 EEG ongoing. Versed restarted overnight due to agitation. Nicardipine increased.  10/06 Developed tachypnea with WUA, no follow commands off sedation  10/07 Vomiting, TF held / restarted with recurrent vomiting 10/16 tracheostomy collar for 9 hours 10/18->10/19 off vent all night  10/20> TC 11/15 > downsized to #4 Shiley flex CFS 04/05/2020.  Tracheostomy tube removed by pulmonary critical care  Subjective: Remains awake.,  Unable to answer questions or follow commands due to anoxic brain injury, appears to be in no distress, PEG tube in place, trach site stable with bandage on top post recent removal of tracheostomy tube.  Objective: Vitals:   04/06/20 2305 04/07/20 0505  BP: (!) 146/76 135/77  Pulse: 87 76  Resp: 20 (!) 21  Temp: 97.9 F (36.6 C) 98.1 F (36.7 C)  SpO2: 98% 95%    Intake/Output Summary (Last 24 hours) at 04/07/2020 0909 Last data filed at 04/06/2020 2359 Gross per 24 hour  Intake 1400 ml  Output 1650 ml  Net -250 ml   Filed Weights   04/05/20 2015 04/06/20 0554 04/07/20 0635  Weight: 74.5 kg 74 kg 72 kg    Exam:  Awake severe contractures lower extremity worse than upper, unable to follow commands due to baseline anoxic brain injury Ryan Orozco,PERRAL, tracheostomy  site covered with bandage, trach removed on 04/05/2020 Supple Neck,No JVD, No cervical lymphadenopathy appriciated.  Symmetrical Chest wall movement, Good air movement bilaterally, CTAB RRR,No Gallops, Rubs or new Murmurs, No Parasternal Heave +ve B.Sounds, Abd Soft, No tenderness, PEG tube in place No Cyanosis, Clubbing or edema, No new Rash or bruise   Assessment/Plan: Acute problems:  Anoxic brain injury 2/2 PEA arrest/pain syndrome secondary to hypertonicity/spastic tetraplegia:  -Presumed 2/2 combination alcohol and BZD  overdose (UDS + for THC and BZDs) -Significant brain injury/spastic tetraplegia with patient now completely bedbound -Continue baclofen, gabapentin, scheduled oxycodone and Zanaflex for spastic tetraplegia; continue Seroquel -1/6 [name] with rehabilitative medicine performed Botox injection into the quadriceps and rectus femoris as well as the biceps femoris and hamstrings, semimembranosus and semitendinosus.  Unable to inject iliopsoas without EMG guidance w/o significant improvement and spasticity hypertonicity.  Unfortunately given lack of improvement he now has bilateral hip flexor contractures -Continue bilateral UE WHO resting splints/PROM q 4 hours/KREG bed -Continue out of bed to chair with lift, continue PT/OT at SNF.   Chronic respiratory failure with inability to maintain patent airway requiring chronic tracheostomy tube/Recent Corynebacterium tracheobronchitis (resolved) -Continue #4 cuffless trach in place-trach changed out on 1/10 RT -Given altered mentation and inability to manage secretions PCCM states patient is not a candidate for elective decannulation -3/4 DECANNULATED!!!!  HIV:  -CD4 count of 124 October 2021 (had been as high as 250 Dec 2020)-as of today up to 259; viral load obtained on 1/15 was <20 -Patient will be able to continue Symtuza after discharge (was on [place] prior to admission).  He is enrolled in the [employer] drug assistance program through [employer].  [facility] and sent with patient upon discharge.  Local pharmacy in [place]/same location SNF confirmed that they have the medication and except [employer] drug assistance medications -CD4 count on 12/23 up to 259 -ID recommends continuing prophylactic Bactrim an additional 3 months (last dose 04/24/2020) Follow with ID post discharge in 1 to 2 weeks.  Dysphagia 2/2 anoxic brain injury - Continue tube feeding per PEG   Moderate Protein  calorie malnutrition nutrition Status:  has a PEG tube, tube feeds as below were given here, SNF can adjust as needed - Tube feeding bolus doses of Osmolite 1.5 2 cartons, Juven twice daily, Prosource TF 45 mL 3 times daily and Nutrisouce fiber BID and free water 150 cc q 8 hrs. Obtain dietary consult as soon as he arrives to SNF.   Hypertension/grade 1 diastolic dysfunction: Stable on Norvasc.  Echo shows a preserved EF.   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    Continue Keppra.  No further seizure activity  SIRS/Fever 2/2 recurrent enterococcus UTI - adequately treated earlier this admission now appears infection free.  Fever/peri-PEG drainage positive for corynebacterium and rare Pseudomonas - continue bacitracin ointment, keep site clean and dry  Inguinal hernias -Evaluated by general surgery this admission.  Supportive care only.  Poor candidate for surgery.  Constipation -Stable on present bowel regimen  Goals of care: Seen by palliative care, now DNR.  Blister dorsum left foot  03/15/20 -Appreciate wound care nurse assistance.   -Continue conservative wound care  Pressure area dorsum left foot not POA Wound / Incision (Open or Dehisced) 11/17/19 Puncture Abdomen Left;Anterior;Upper G-tube insertion site  (Active)  Date First Assessed/Time First Assessed: 11/17/19 1646   Wound Type: Puncture  Location: Abdomen  Location Orientation: Left;Anterior;Upper  Wound Description (Comments): G-tube insertion site   Present on Admission: No    Assessments 11/17/2019  4:47 PM 04/06/2020 11:00 AM  Dressing Type Gauze (Comment);Tape dressing Gauze (Comment)  Dressing Changed New New  Dressing Status Clean;Dry;Intact Clean;Dry;Intact  Dressing Change Frequency PRN --  Site / Wound Assessment Clean;Dry;Pink Dry;Clean  Peri-wound Assessment Intact Intact  Closure None --  Drainage Amount None --  Treatment Cleansed --     No Linked orders to display     Wound / Incision  (Open or Dehisced) 03/16/20 Other (Comment) Foot Anterior;Left (Active)  Date First Assessed/Time First Assessed: 03/16/20 0800   Wound Type: (c) Other (Comment)  Location: Foot  Location Orientation: Anterior;Left  Present on Admission: No    Assessments 03/16/2020  8:00 AM 04/05/2020  5:00 AM  Dressing Type Other (Comment) Gauze (Comment)  Dressing Changed Changed Changed  Dressing Status Old drainage Old drainage  Dressing Change Frequency Daily Daily  Site / Wound Assessment Pink;Red Clean;Dry  Peri-wound Assessment Intact --  Drainage Amount Minimal --  Drainage Description Serosanguineous --  Treatment Cleansed --     No Linked orders to display      Data Reviewed: Basic Metabolic Panel: Recent Labs  Lab 04/07/20 0153  NA 142  K 4.2  CL 104  CO2 24  GLUCOSE 92  BUN 15  CREATININE 0.62  CALCIUM 10.3  MG 2.0   Liver Function Tests: No results for input(s): AST, ALT, ALKPHOS, BILITOT, PROT, ALBUMIN in the last 168 hours. No results for input(s): LIPASE, AMYLASE in the last 168 hours. No results for input(s): AMMONIA in the last 168 hours. CBC: Recent Labs  Lab 04/07/20 0153  WBC 3.7*  HGB 13.8  HCT 41.9  MCV 95.2  PLT 142*   Cardiac Enzymes: No results for input(s): CKTOTAL, CKMB, CKMBINDEX, TROPONINI in the last 168 hours. BNP (last 3 results) Recent Labs    02/15/20 0433  BNP 3.9    ProBNP (last 3 results) No results for input(s): PROBNP in the last 8760 hours.  CBG: Recent Labs  Lab 04/05/20 1912 04/05/20 2305 04/06/20 0111 04/06/20 1951 04/06/20 2303  GLUCAP 146* 131* 97 161* 97    No results found for this or any previous visit (from the past 240 hour(s)).   Studies: DG Chest Port 1 View  Result Date: 04/06/2020 CLINICAL DATA:  55 year old male with shortness of breath. EXAM: PORTABLE CHEST 1 VIEW COMPARISON:  None. FINDINGS: The soft tissues of the neck and chin overlie the left pulmonary apex. The heart size and mediastinal contours are  within normal limits. Mild bibasilar subsegmental atelectasis. The visualized skeletal structures are unremarkable. IMPRESSION: Bibasilar subsegmental atelectasis. Electronically Signed   By: Ruthann Cancer MD   On: 04/06/2020 10:17    Scheduled Meds: . acetaminophen (TYLENOL) oral liquid 160 mg/5 mL  650 mg Per Tube Q6H  . amLODipine  10 mg Per Tube Daily  . bacitracin   Topical BID  . baclofen  30 mg Per Tube TID  . chlorhexidine gluconate (MEDLINE KIT)  15 mL Mouth Rinse BID  . Darunavir-Cobicisctat-Emtricitabine-Tenofovir Alafenamide  1 tablet Oral Q breakfast  . famotidine  10.4 mg Per Tube BID  . feeding supplement (OSMOLITE 1.5 CAL)  474 mL Per Tube QID  . feeding supplement (PROSource TF)  45 mL Per Tube Daily  . fiber  1 packet Per Tube BID  . folic acid  1 mg Per Tube Daily  . free water  150 mL Per  Tube Q8H  . gabapentin  200 mg Per Tube Q8H  . heparin injection (subcutaneous)  5,000 Units Subcutaneous Q8H  . levETIRAcetam  1,500 mg Per Tube BID  . magic mouthwash w/lidocaine  5 mL Oral QID  . Muscle Rub   Topical QID  . oxyCODONE  5 mg Per Tube Q6H  . polyethylene glycol  17 g Per Tube QHS  . QUEtiapine  12.5 mg Per Tube QHS  . scopolamine  1 patch Transdermal Q72H  . sulfamethoxazole-trimethoprim  1 tablet Per Tube Once per day on Mon Wed Fri  . thiamine  100 mg Per Tube Daily  . tiZANidine  6 mg Per Tube Q6H   Continuous Infusions:   Active Problems:   Cardiac arrest [facility])   Community acquired pneumonia of left upper lobe of lung   Anoxic brain injury ([place])   Alcohol abuse   Acute respiratory failure with hypoxia (HCC)   Vomiting   Pressure injury of skin   Small bowel obstruction (HCC)   Palliative care encounter   Bowel obstruction (HCC)   Chronic respiratory failure (HCC)   Diastolic dysfunction   Tracheostomy status (HCC)   Dysphagia   Protein-calorie malnutrition (HCC)   Seizures (HCC)   Bilateral recurrent inguinal hernia   Muscle spasticity    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HCC)   Tracheobronchitis   Sequela of Corynebacterium infection   Abnormal urine   Urinary tract infection due to Enterococcus   Fever   Malnutrition of moderate degree   Tracheostomy dependent [facility])   Consultants:  PCCM  Palliative medicine  General surgery  Neurology  Interventional radiology  Procedures:  EEG  Echocardiogram  Tracheostomy    Time spent: 20 minutes Signature  Lala Lund M.D on 04/07/2020 at 9:09 AM   -  To page go to [email or web address]    04/07/2020, 9:09 AM  LOS: 158 days

## 2020-04-07 NOTE — Progress Notes (Signed)
Attempted to shave patient per family request. Very agitated at this time, will attempt later.

## 2020-04-08 LAB — GLUCOSE, CAPILLARY
Glucose-Capillary: 93 mg/dL (ref 70–99)
Glucose-Capillary: 94 mg/dL (ref 70–99)
Glucose-Capillary: 122 mg/dL — ABNORMAL HIGH (ref 70–99)
Glucose-Capillary: 163 mg/dL — ABNORMAL HIGH (ref 70–99)
Glucose-Capillary: 92 mg/dL (ref 70–99)
Glucose-Capillary: 86 mg/dL (ref 70–99)
Glucose-Capillary: 102 mg/dL — ABNORMAL HIGH (ref 70–99)
Glucose-Capillary: 128 mg/dL — ABNORMAL HIGH (ref 70–99)
Glucose-Capillary: 132 mg/dL — ABNORMAL HIGH (ref 70–99)
Glucose-Capillary: 135 mg/dL — ABNORMAL HIGH (ref 70–99)
Glucose-Capillary: 103 mg/dL — ABNORMAL HIGH (ref 70–99)
Glucose-Capillary: 93 mg/dL (ref 70–99)
Glucose-Capillary: 96 mg/dL (ref 70–99)

## 2020-04-08 NOTE — TOC Transition Note (Addendum)
Transition of Care [facility]) - CM/SW Discharge Note   Patient Details  Name: Ryan Orozco MRN: [id number] Date of Birth: 08-24-1965  Transition of Care [facility]) CM/SW Contact:  Janae Bridgeman, RN Phone Number: 04/08/2020, 8:06 AM   Clinical Narrative:    Case management is preparing patient's pending transport to [facility] today to the facility via [facility] transport.  Edwin Dada, MSW called and schedule [facility] transport today at 0930 am since the SNF facility is requesting that the patient arrive early to the facility for admission.  Dr. Thedore Mins is aware and is documenting patient's discharge summary and related discharge instructions to the facility.  Bedside nursing staff will be sending the patient with PTAR packet to include facesheet, medical necessity form and HIV medication from [facility] pharmacy to the given to the facility upon arrival.  The bedside nurse will call report to [facility] at [phone], Room 407 - Please ask for 400 hall unit manager to give report.  [facility] is located at [address] [place], [place] [address].  CM and MSW to follow the patient for discharge to the facility this morning.  04/08/2020  0944 - [facility] arrived to transport the patient to [facility] in [place], [place].  The patient's sister, [name], is present in the room to visit with the patient prior to patient discharge and departure to the facility.  The discharge summary and orders were included in the packet and the sister, [name], was given a copy of the patient's COVID vaccine card.  Original COVID vaccine card was placed in the PTAR packet along with Symtuza prescription to transfer to the facility.       Final next level of care: Skilled Nursing Facility Barriers to Discharge: Continued Medical Work up,SNF Pending bed offer   Patient Goals and CMS Choice Patient states  their goals for this hospitalization and ongoing recovery are:: Patient placement at a SNF facility for long term care.      Discharge Placement                       Discharge Plan and Services   Discharge Planning Services: CM Consult Post Acute Care Choice: Nursing Home                               Social Determinants of Health (SDOH) Interventions     Readmission Risk Interventions Readmission Risk Prevention Plan 04/08/2020 02/06/2020  Transportation Screening Complete Complete  PCP or Specialist Appt within 3-5 Days - Complete  HRI or Home Care Consult - Complete  Social Work Consult for Recovery Care Planning/Counseling - Complete  Palliative Care Screening - Complete  Medication Review [facility]) Complete Complete  PCP or Specialist appointment within 3-5 days of discharge Complete -  HRI or Home Care Consult Complete -  SW Recovery Care/Counseling Consult Complete -  Palliative Care Screening Complete -  Skilled Nursing Facility Complete -  Some recent data might be hidden

## 2020-04-08 NOTE — Progress Notes (Signed)
Nutrition Follow-up  DOCUMENTATION CODES:   Non-severe (moderate) malnutrition in context of chronic illness  INTERVENTION:   -Continue bolus feedings via PEG:   474 ml (2 cartons) Osmolite 1.5 QID  45 ml Prosource TF daily  150 ml free water flush every 8 hours  -1 packet Nutrisource fiber BID  Tube feeding regimen provides 2280 kcal (100% of needs), 130 grams of protein, and 1448 ml of H2O. Total free water: 2858 ml daily  NUTRITION DIAGNOSIS:   Moderate Malnutrition related to chronic illness as evidenced by mild fat depletion,mild muscle depletion.  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 PMH includes HIV and EtOH use.  09/29 - intubated 10/07- vomiting, TF held, laterrestarted with recurrent vomiting 10/12-R/L inguinal hernia, partial SBO  10/13- trach 10/15-PEG 10/16 - tolerated TC for 9 hours 10/18->10/19 - off vent overnight 10/20- TC 11/15 - trach downsized to #4 Shiley flex CFS 12/06 - trach changed (same #4 Shiley flex CFS) 1/11 - trach changed (same #4 Shiley flex CFS) 2/7 - trach changed (same #4 Shiley flex CFS) 2/14 - CXR unremarkable  Reviewed I/O's: -1.6 L x 24 hours and -19 L since 03/25/20  UOP: 2.1 L x 24 hours  Pt remains NPO and is tolerating TF via PEG per RN. Current TF regimen is as follows: 2 cartons (464m) Osmolite 1.5 TID, 1 carton (2337m Osmolite 1.5 once daily, 6028mree water flush before and after each tube feeding bolus + additional FWF of 200m61mH, 45ml10msource TF TID and Nutrisource fiber BID. Continue  with current regimen.   Pt medically stable for discharge; per CSW notes, plan to discharge to SNF today.   Medications reviewed and include keppra, miralax, and thiamine.   Labs reviewed: CBGS: 96-161 (inpatient orders for glycemic control are none).   Diet Order:   Diet Order    None      EDUCATION NEEDS:   Not appropriate for education at this time  Skin:  Skin Assessment: Skin Integrity Issues: Skin Integrity Issues:: Other (Comment) Stage II: N/A Stage III: N/A Other: puncture abdomen (PEG insertion site)  Last BM:  04/07/20  Height:   Ht Readings from Last 1 Encounters:  11/01/19 _0  (1.676 m)    Weight:   Wt Readings from Last 1 Encounters:  04/08/20 74 kg   BMI:  Body mass index is 26.33 kg/m.  Estimated Nutritional Needs:   Kcal:  2700-2900  Protein:  120-135 grams  Fluid:  >2L/d    JenifLoistine Chance LDN, CDCESPrentissstered Dietitian II Certified Diabetes Care and Education Specialist Please refer to AMIONA Rosie PlaceRD and/or RD on-call/weekend/after hours pager

## 2020-04-08 NOTE — Progress Notes (Addendum)
CSW spoke with PT[facility] staff - patient's transportation has been scheduled for pickup at 9:30am.  Patient's medications, discharge summary, and all necessary paperwork sent with PTAR at discharge.  Edwin Dada, MSW, LCSW Transitions of Care  Clinical Social Worker II [phone]

## 2020-04-08 NOTE — Plan of Care (Signed)
  Problem: Clinical Measurements: Goal: Will remain free from infection Outcome: Adequate for Discharge Goal: Diagnostic test results will improve Outcome: Adequate for Discharge Goal: Respiratory complications will improve Outcome: Adequate for Discharge Goal: Cardiovascular complication will be avoided Outcome: Adequate for Discharge   Problem: Activity: Goal: Risk for activity intolerance will decrease Outcome: Adequate for Discharge   Problem: Nutrition: Goal: Adequate nutrition will be maintained Outcome: Adequate for Discharge   Problem: Coping: Goal: Level of anxiety will decrease Outcome: Adequate for Discharge   Problem: Elimination: Goal: Will not experience complications related to bowel motility Outcome: Adequate for Discharge Goal: Will not experience complications related to urinary retention Outcome: Adequate for Discharge   Problem: Pain Managment: Goal: General experience of comfort will improve Outcome: Adequate for Discharge   Problem: Safety: Goal: Ability to remain free from injury will improve Outcome: Adequate for Discharge   Problem: Skin Integrity: Goal: Risk for impaired skin integrity will decrease Outcome: Adequate for Discharge   Problem: Education: Goal: Knowledge of the prescribed therapeutic regimen Outcome: Adequate for Discharge Goal: Knowledge of disease or condition will improve Outcome: Adequate for Discharge   Problem: Clinical Measurements: Goal: Neurologic status will improve Outcome: Adequate for Discharge   Problem: Tissue Perfusion: Goal: Ability to maintain intracranial pressure will improve Outcome: Adequate for Discharge   Problem: Respiratory: Goal: Will regain and/or maintain adequate ventilation Outcome: Adequate for Discharge   Problem: Skin Integrity: Goal: Risk for impaired skin integrity will decrease Outcome: Adequate for Discharge Goal: Demonstration of wound healing without infection will  improve Outcome: Adequate for Discharge   Problem: Psychosocial: Goal: Ability to verbalize positive feelings about self will improve Outcome: Adequate for Discharge Goal: Ability to participate in self-care as condition permits will improve Outcome: Adequate for Discharge Goal: Ability to identify appropriate support needs will improve Outcome: Adequate for Discharge   Problem: Health Behavior/Discharge Planning: Goal: Ability to manage health-related needs will improve Outcome: Adequate for Discharge   Problem: Nutritional: Goal: Risk of aspiration will decrease Outcome: Adequate for Discharge Goal: Dietary intake will improve Outcome: Adequate for Discharge   Problem: Communication: Goal: Ability to communicate needs accurately will improve Outcome: Adequate for Discharge   Problem: Education: Goal: 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 Outcome: Adequate for Discharge   Problem: Activity: Goal: Ability to tolerate increased activity will improve Outcome: Adequate for Discharge   Problem: Health Behavior/Discharge Planning: Goal: 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 Outcome: Adequate for Discharge   Problem: Respiratory: Goal: Patent airway maintenance will improve Outcome: Adequate for Discharge   Problem: Role Relationship: Goal: Ability to communicate will improve Outcome: Adequate for Discharge

## 2020-04-08 NOTE — Progress Notes (Signed)
Patient discharged in stable condition via PTAR. AVS documentation given to transport, along with belongings.   10:30am-attempted report X's 1 to [facility].   10:40am-called report again and stayed on hold 10 minutes and left voicemail with 400 hall unit manager.

## 2020-09-23 ENCOUNTER — Ambulatory Visit (INDEPENDENT_AMBULATORY_CARE_PROVIDER_SITE_OTHER): Payer: Self-pay | Admitting: Infectious Diseases

## 2020-09-23 ENCOUNTER — Other Ambulatory Visit: Payer: Self-pay

## 2020-09-23 VITALS — BP 116/69 | HR 89 | Resp 16 | Ht 66.0 in | Wt 163.0 lb

## 2020-09-23 DIAGNOSIS — Z7185 Encounter for immunization safety counseling: Secondary | ICD-10-CM | POA: Insufficient documentation

## 2020-09-23 DIAGNOSIS — B20 Human immunodeficiency virus [HIV] disease: Secondary | ICD-10-CM | POA: Insufficient documentation

## 2020-09-23 DIAGNOSIS — Z113 Encounter for screening for infections with a predominantly sexual mode of transmission: Secondary | ICD-10-CM

## 2020-09-23 NOTE — Progress Notes (Signed)
[address], [place], [place], [address]                                                                  Phn. [phone]; Fax: [phone]                                                                             Date: 09/23/20  Reason for Visit: HIV follow up   HPI: Ryan Orozco is a 55 y.o.old male with a history of HIV diagnosed in 1995 and recent hospital admission 2-04/2020 for pulseless PEA with severe anoxic brain injury  and severe spastic tetraplegia who is here for HIV follow up. Patient is non verbal and is accompanied by a person from the facility who has no idea about the patient. There is a MAR sent with no mention of his ART and bactrim. Ryan Orozco called to his facility and got confirmation that patient is getting symtuza once a day and bactrim three times a week.   Retrospectively, patient was last seen in 02/18/2018 by NP Ryan Orozco when patient was seen a as a new HIV patient to establish care. Patient was started on Biktarvy. VL and CD4 count at diagnosis 01/2018  was  6650 and 250  (37% ) respectively. Patient was never seen in the clinic after 02/2018. Patient seems to have been discharged on Symtuza in his recent hospitalisation in March 2022.   ROS: unobtainable at this time due to patient's clinical condition   HIV History  HIV diagnosed: 1995 CD4 at diagnosis: 250, 37% VL at diagnosis: 6650 Recent CD4: 726 ( 36%) Recent viral load:< 20 02/17/2020 Prior ART: biktarvy  Current ART: symtuza Hx of OI: None Hx of STI: None  Risk factors:   Genotype Hx: wild type virus   Current Outpatient Medications on File Prior to Visit  Medication Sig Dispense Refill   acetaminophen (TYLENOL) 160 MG/5ML solution Place 20.3 mLs (650 mg total) into feeding tube every 6 (six)  hours. 120 mL 0   acetaminophen (TYLENOL) 160 MG/5ML solution Place 20.3 mLs (650 mg total) into feeding tube every 6 (six) hours as needed for fever. 120 mL 0   amLODipine (NORVASC) 10 MG tablet Place 1 tablet (10 mg total) into feeding tube daily.     baclofen (LIORESAL) 10 MG tablet Place 3 tablets (30 mg total) into feeding tube 3 (three) times daily. 30 each 0   bisacodyl (DULCOLAX) 10 MG suppository Place 1 suppository (10 mg total) rectally daily as needed for severe constipation. 12 suppository 0   Darunavir-Cobicisctat-Emtricitabine-Tenofovir Alafenamide (SYMTUZA) 800-150-200-10 MG TABS Take 1 tablet by mouth daily  with breakfast. 30 tablet 12   docusate (COLACE) 50 MG/5ML liquid Place 10 mLs (100 mg total) into feeding tube 2 (two) times daily as needed for mild constipation. 100 mL 0   famotidine (PEPCID) 40 MG/5ML suspension Place 1.3 mLs (10.4 mg total) into feeding tube 2 (two) times daily. 50 mL 0   fiber (NUTRISOURCE FIBER) PACK packet Place 1 packet into feeding tube 2 (two) times daily.     folic acid (FOLVITE) 1 MG tablet Place 1 tablet (1 mg total) into feeding tube daily.     gabapentin (NEURONTIN) 250 MG/5ML solution Place 4 mLs (200 mg total) into feeding tube every 8 (eight) hours. 250 mL 12   levETIRAcetam (KEPPRA) 100 MG/ML solution Place 15 mLs (1,500 mg total) into feeding tube 2 (two) times daily. 473 mL 12   Menthol-Methyl Salicylate (MUSCLE RUB) 10-15 % CREA Apply 1 application topically 4 (four) times daily.  0   Nutritional Supplements (FEEDING SUPPLEMENT, OSMOLITE 1.5 CAL,) LIQD Place 474 mLs into feeding tube 4 (four) times daily.  0   Nutritional Supplements (FEEDING SUPPLEMENT, PROSOURCE TF,) liquid Place 45 mLs into feeding tube daily.     oxyCODONE (ROXICODONE) 5 MG/5ML solution Place 5 mLs (5 mg total) into feeding tube every 6 (six) hours. 250 mL 0   polyethylene glycol (MIRALAX / GLYCOLAX) 17 g packet Place 17 g into feeding tube at bedtime. 14 each 0    QUEtiapine (SEROQUEL) 25 MG tablet Place 0.5 tablets (12.5 mg total) into feeding tube at bedtime. 30 tablet 0   scopolamine (TRANSDERM-SCOP) 1 MG/3DAYS Place 1 patch (1.5 mg total) onto the skin every 3 (three) days. 10 patch 12   sulfamethoxazole-trimethoprim (BACTRIM DS) 800-160 MG tablet Place 1 tablet into feeding tube 3 (three) times a week. 8 tablet 0   thiamine 100 MG tablet Place 1 tablet (100 mg total) into feeding tube daily.     tiZANidine (ZANAFLEX) 2 MG tablet Place 3 tablets (6 mg total) into feeding tube every 6 (six) hours. 30 tablet 0   Water For Irrigation, Sterile (FREE WATER) SOLN Place 150 mLs into feeding tube every 8 (eight) hours.     No current facility-administered medications on file prior to visit.     Allergies  Allergen Reactions   Tomato Hives   Past Medical History:  Diagnosis Date   Alcohol abuse    HIV (human immunodeficiency virus infection) (HCC) 1995   Past Surgical History:  Procedure Laterality Date   IR GASTROSTOMY TUBE MOD SED  11/17/2019   NO PAST SURGERIES     Social History   Socioeconomic History   Marital status: Single    Spouse name: Not on file   Number of children: Not on file   Years of education: Not on file   Highest education level: Not on file  Occupational History   Not on file  Tobacco Use   Smoking status: Never   Smokeless tobacco: Never  Substance and Sexual Activity   Alcohol use: Yes    Comment: occasionally   Drug use: No   Sexual activity: Yes    Partners: Female    Birth control/protection: Condom  Other Topics Concern   Not on file  Social History Narrative   Not on file   Social Determinants of Health   Financial Resource Strain: Not on file  Food Insecurity: Not on file  Transportation Needs: Not on file  Physical Activity: Not on file  Stress: Not on file  Social Connections: Not on file  Intimate Partner Violence: Not on file   Family History  Problem Relation Age of Onset    Hypertension Mother    Throat cancer Mother    Lung cancer Father    Cancer Maternal Grandfather      Vitals  BP 116/69   Pulse 89   Resp 16   Ht 5\' 6"  (1.676 m)   Wt 163 lb (73.9 kg)   BMI 26.31 kg/m    Examination  Lying in the wheel chair in a fetal position with contracted extremities Incomprehensible words, does not follow commands No thrush on oral cavity, missing teeth Heart and lung sounds WNL Abdomen - soft, PEG tube + Skin - no obvious rashes Neuro - anoxic brain injury, cannot assess   Lab Results HIV 1 RNA Quant  Date Value  02/17/2020 <20 copies/mL  10/12/2019 1,020 Copies/mL (H)  01/24/2018 6,650 copies/mL (H)   CD4 T Cell Abs (/uL)  Date Value  11/05/2019 124 (L)  10/12/2019 191 (L)  01/24/2018 250 (L)   No results found for: HIV1GENOSEQ Lab Results  Component Value Date   WBC 3.7 (L) 04/07/2020   HGB 13.8 04/07/2020   HCT 41.9 04/07/2020   MCV 95.2 04/07/2020   PLT 142 (L) 04/07/2020    Lab Results  Component Value Date   CREATININE 0.62 04/07/2020   BUN 15 04/07/2020   NA 142 04/07/2020   K 4.2 04/07/2020   CL 104 04/07/2020   CO2 24 04/07/2020   Lab Results  Component Value Date   ALT 18 03/31/2020   AST 21 03/31/2020   ALKPHOS 92 03/31/2020   BILITOT 0.4 03/31/2020    Lab Results  Component Value Date   CHOL 197 10/12/2019   TRIG 148 11/10/2019   HDL 96 10/12/2019   LDLCALC 86 10/12/2019   Lab Results  Component Value Date   HAV REACTIVE (A) 01/24/2018   Lab Results  Component Value Date   HEPBSAG NON-REACTIVE 01/24/2018   HEPBSAB REACTIVE (A) 01/24/2018   No results found for: HCVAB Lab Results  Component Value Date   CHLAMYDIAWP Negative 01/24/2018   N Negative 01/24/2018   No results found for: GCPROBEAPT No results found for: QUANTGOLD    Health Maintenance: Immunization History  Administered Date(s) Administered   Influenza,inj,Quad PF,6+ Mos 02/11/2020   PFIZER Comirnaty(Gray Top)Covid-19  Tri-Sucrose Vaccine 03/11/2020   PFIZER(Purple Top)SARS-COV-2 Vaccination 02/12/2020   Tdap 03/05/2011    Assessment/Plan: Problem List Items Addressed This Visit       Other   Human immunodeficiency virus (HIV) disease (HCC) - Primary   Relevant Orders   HIV-1 RNA ultraquant reflex to gentyp+   T-helper cell (CD4)- ([facility] clinic only)   Lipid panel   Basic metabolic panel   Ambulatory referral to Internal Medicine   Screening for venereal disease (VD)   Relevant Orders   RPR   Urine cytology ancillary only   Immunization counseling    HIV Continue Symtuza as is  Continue bactrim ppx as is, will plan to DC if CD4 count today is more than 200 with undetectable VL Follow up in 4-5 months   STD Screening  Urine GC and RPR  Immunization Will plan for recommended vaccines in next visit   Health Maintenance Amb Referral to Internal Medicine to establish care and age based screening    Patient's labs were reviewed as well as his previous records. Patients questions were addressed and answered. Safe sex counseling done.  I have personally spent 45  minutes involved in face-to-face and non-face-to-face activities for this patient on the day of the visit including review of prior medical records and coordination of care.   Electronically signed by:  Odette Fraction, MD Infectious Disease Physician [facility] [address] [place], [place] [address] Phone: [phone]  Fax: [phone]

## 2020-09-23 NOTE — Addendum Note (Signed)
Addended by: Harley Alto on: 09/23/2020 01:57 PM   Modules accepted: Orders

## 2020-10-04 ENCOUNTER — Other Ambulatory Visit (HOSPITAL_COMMUNITY): Payer: Self-pay

## 2020-10-04 DIAGNOSIS — R1310 Dysphagia, unspecified: Secondary | ICD-10-CM

## 2020-10-08 ENCOUNTER — Ambulatory Visit (HOSPITAL_COMMUNITY)
Admission: RE | Admit: 2020-10-08 | Discharge: 2020-10-08 | Disposition: A | Discharge status: Home / Self Care | Payer: Medicaid Other | Source: Ambulatory Visit | Attending: Family Medicine | Admitting: Family Medicine

## 2020-10-08 ENCOUNTER — Other Ambulatory Visit: Payer: Self-pay

## 2020-10-08 DIAGNOSIS — R1310 Dysphagia, unspecified: Secondary | ICD-10-CM | POA: Diagnosis present

## 2020-12-03 DEATH — deceased

## 2021-12-08 IMAGING — DX DG ABD PORTABLE 1V
1 series · 1 of 1 positions shown · non-contrast
Comparison: None.

CLINICAL DATA: Vomiting, cardiac arrest

EXAM:
PORTABLE ABDOMEN - 1 VIEW

[abdomen kub]
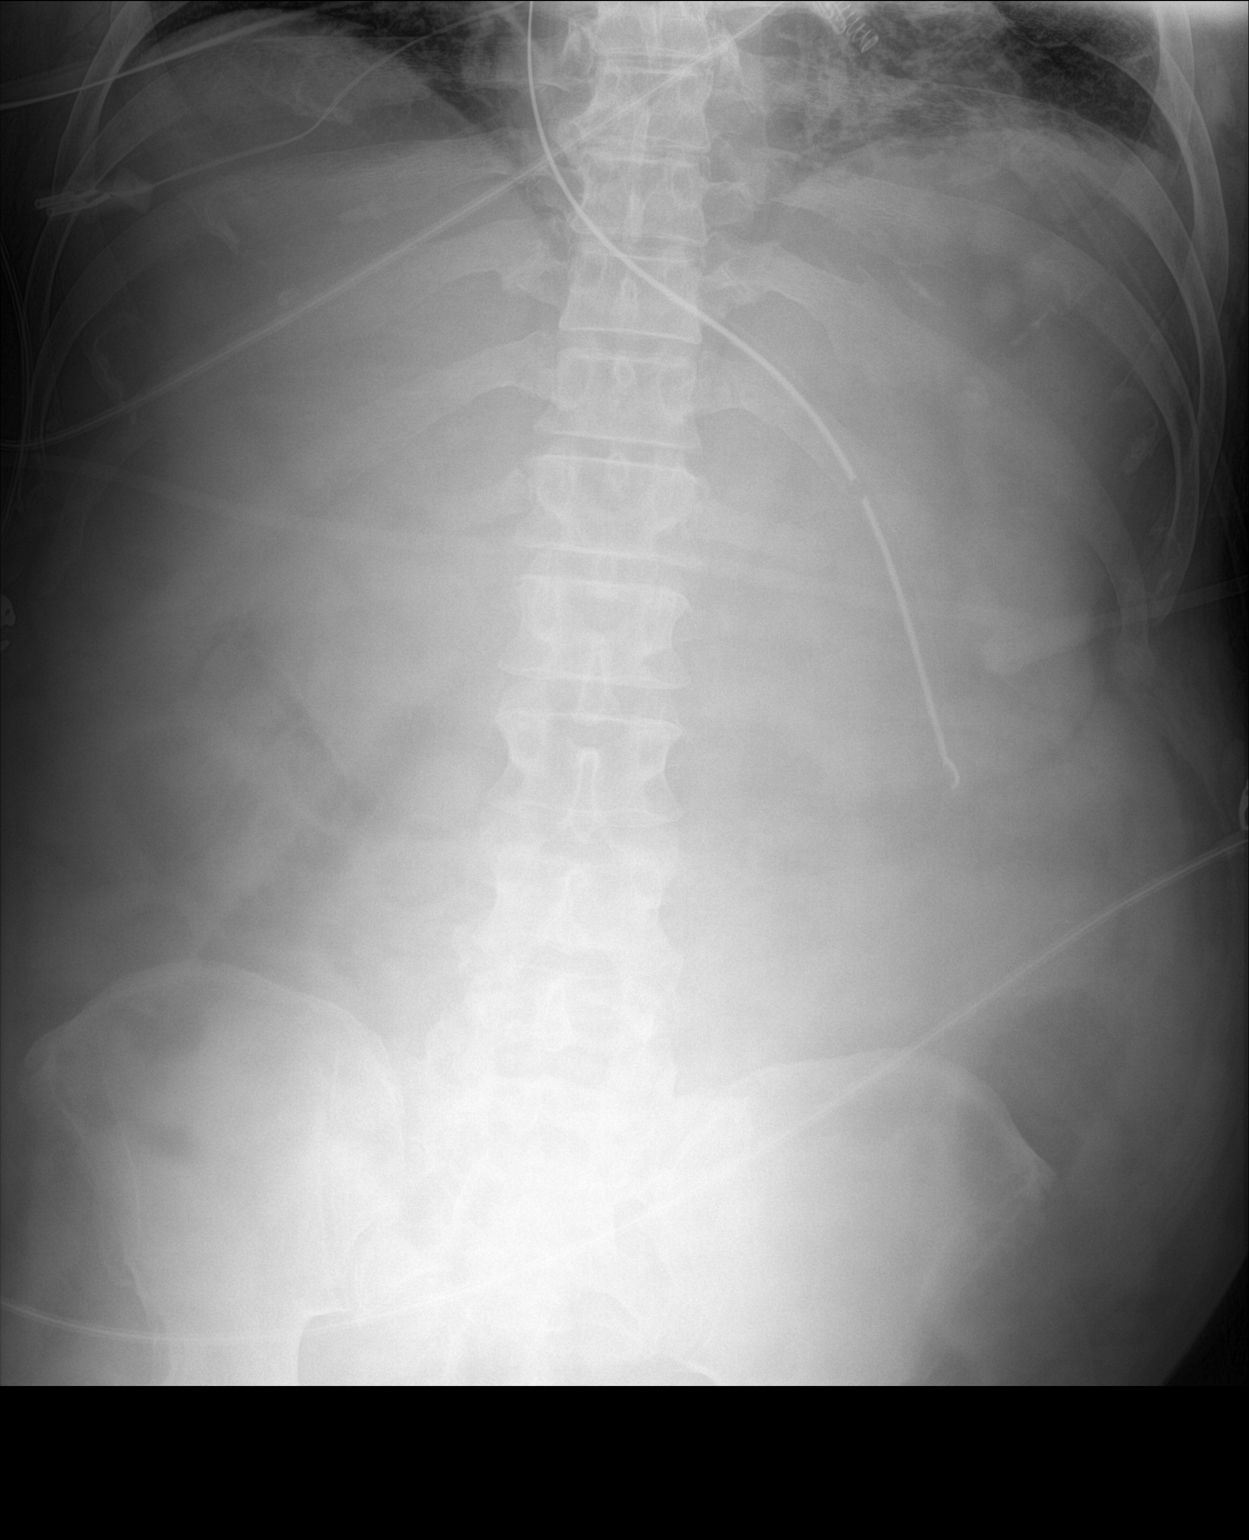

[1 of 1 positions shown; findings below may reference images not displayed]

FINDINGS: Nasogastric tube is seen with its tip overlying the expected mid
body of the stomach. The abdominal gas pattern is nonspecific due to
a paucity of intra-abdominal gas.
IMPRESSION: Nasogastric tube in expected position.

## 2021-12-09 IMAGING — DX DG CHEST 1V PORT
1 series · 1 of 1 positions shown · non-contrast
Comparison: 11/09/2019.

CLINICAL DATA: Respiratory failure.

EXAM:
PORTABLE CHEST 1 VIEW

[chest]
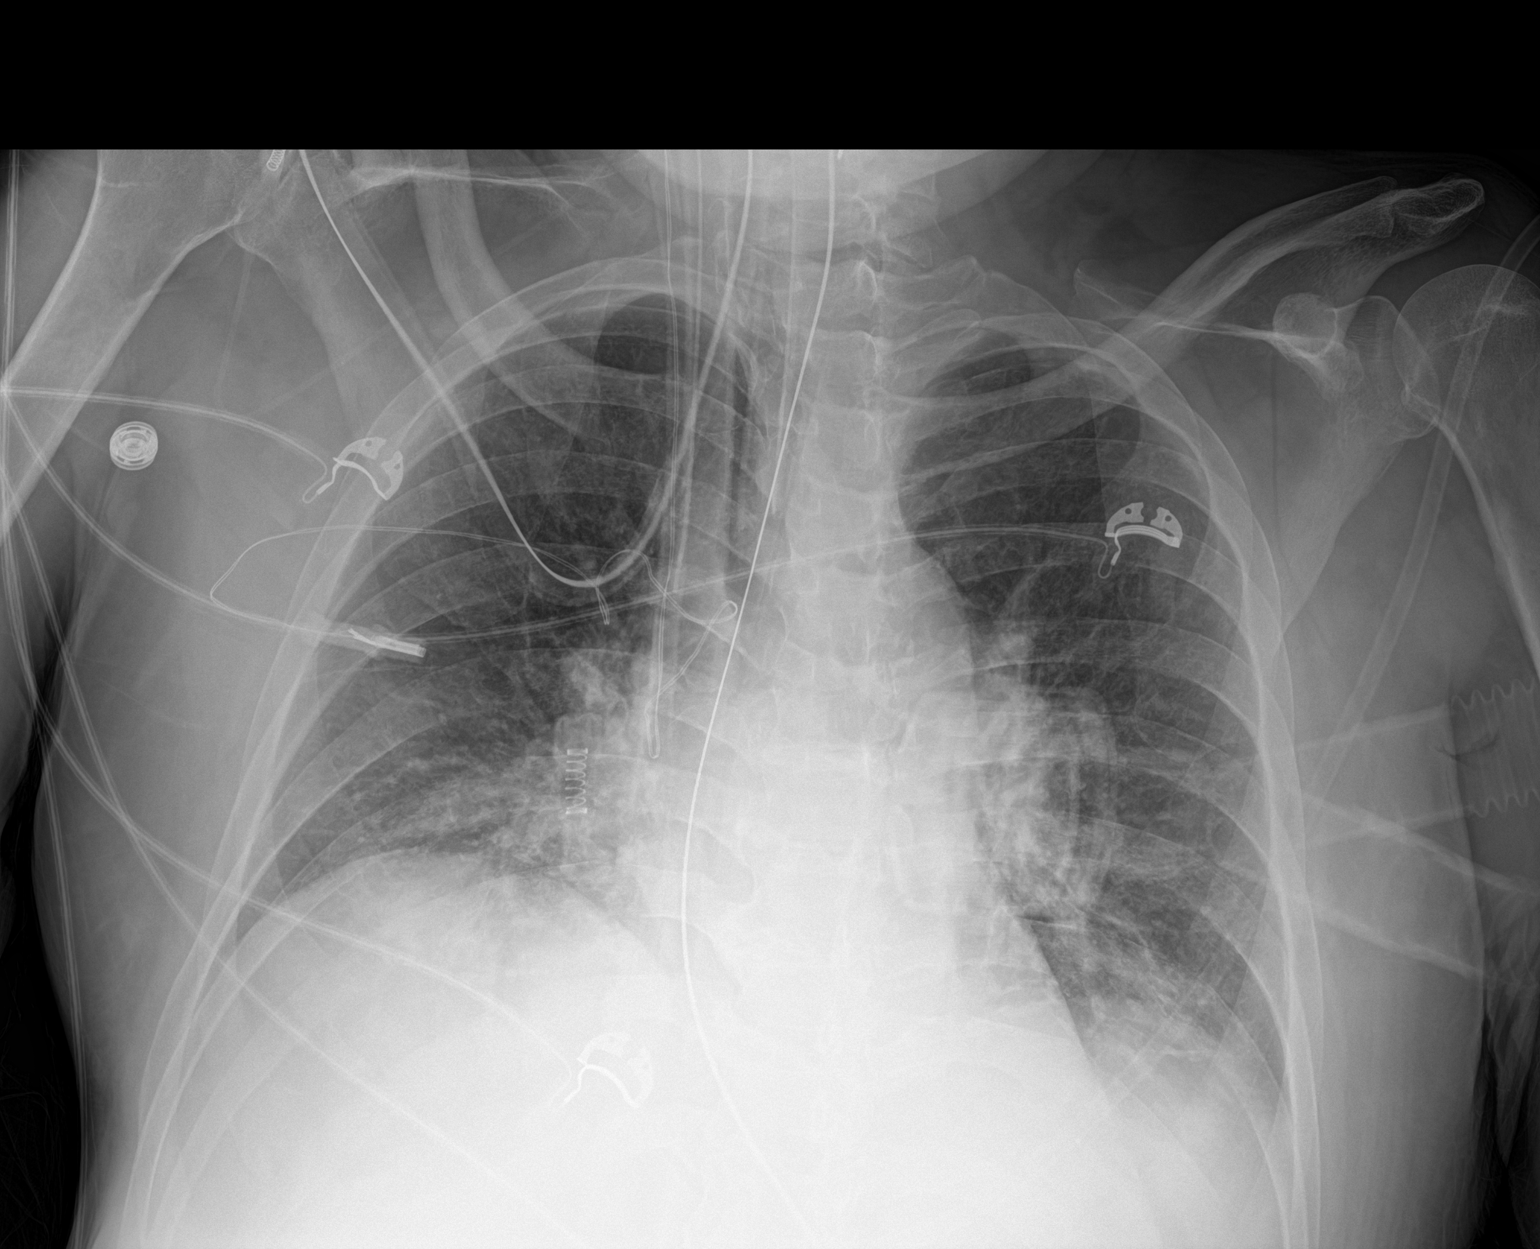

[1 of 1 positions shown; findings below may reference images not displayed]

FINDINGS: Interim placement of right IJ line, its tip is at the cavoatrial
junction. Endotracheal tube, NG tube in stable position. Heart size
normal. Low lung volumes with bibasilar atelectasis and infiltrates.
Similar findings noted on prior exam. No pleural effusion or
pneumothorax.
IMPRESSION: 1. Interim placement of right IJ line, its tip is at the cavoatrial
junction. Endotracheal tube and NG tube in stable position.
2. Low lung volumes with bibasilar atelectasis and infiltrates,
unchanged from prior exam.

## 2021-12-11 IMAGING — US US SCROTUM
1 series · 14 of 25 positions shown · non-contrast
Comparison: None.

CLINICAL DATA: Left scrotal swelling.  Patient unresponsive.

EXAM:
ULTRASOUND OF SCROTUM
TECHNIQUE: Complete ultrasound examination of the testicles, epididymis, and
other scrotal structures was performed.

[Series 1: us scrotum · 30 acquisitions, 14 frames shown]
[im 1/30]
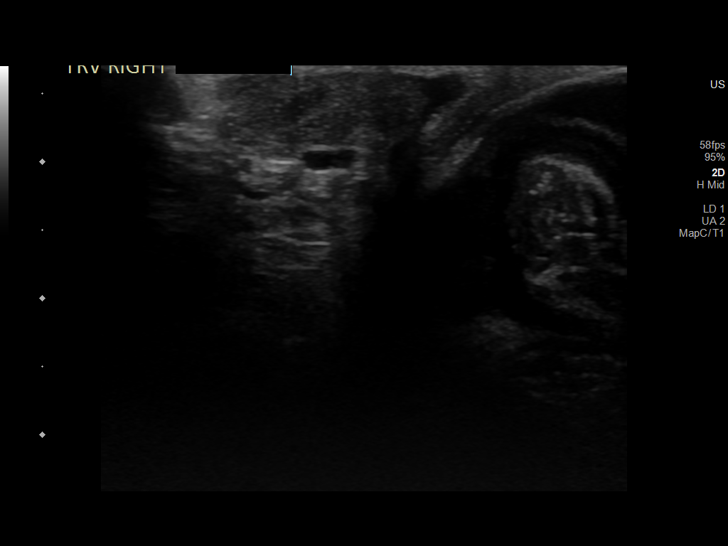
[im 3/30]
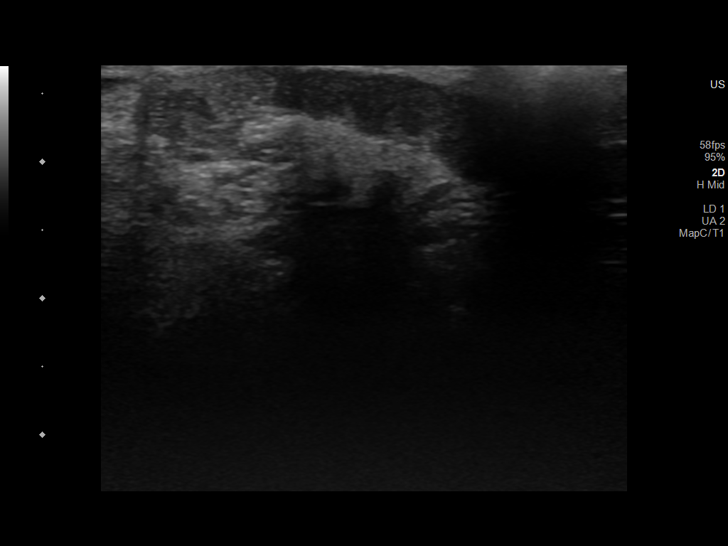
[im 5/30]
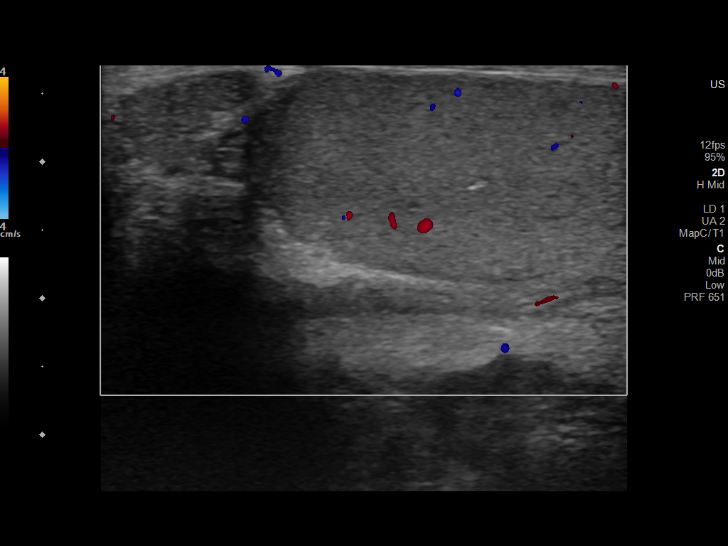
[im 8/30]
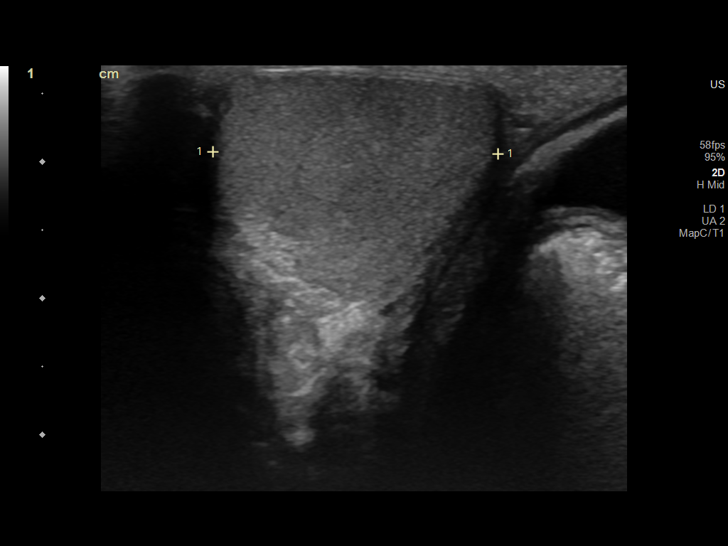
[im 10/30]
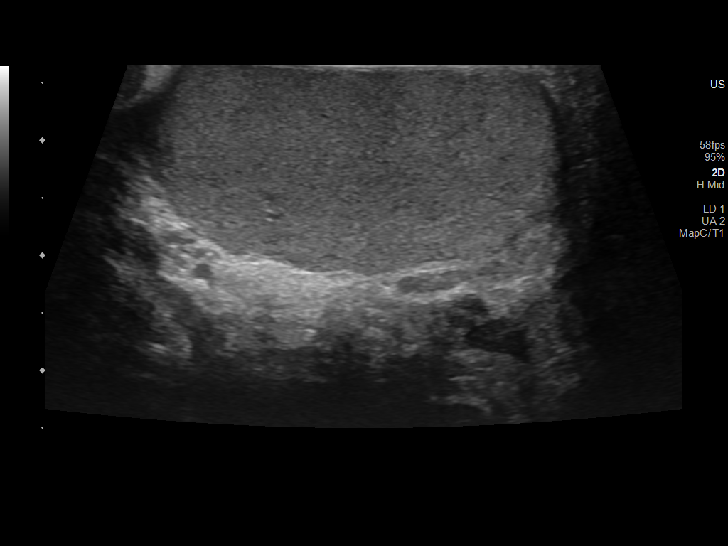
[im 11/30]
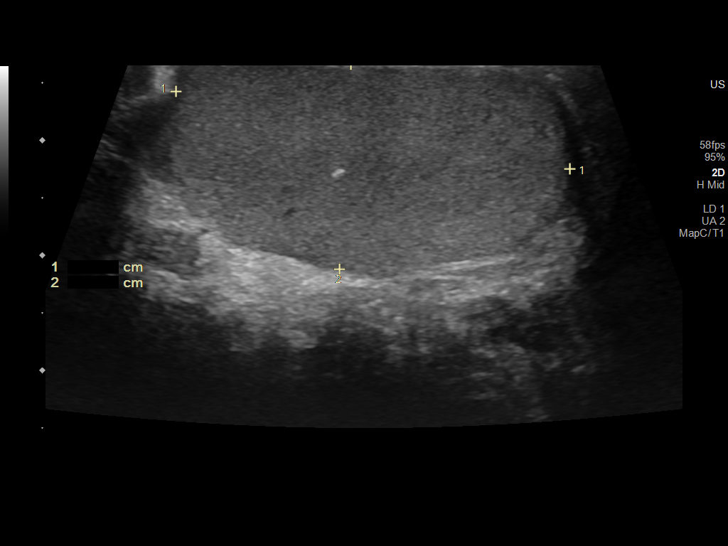
[im 14/30]
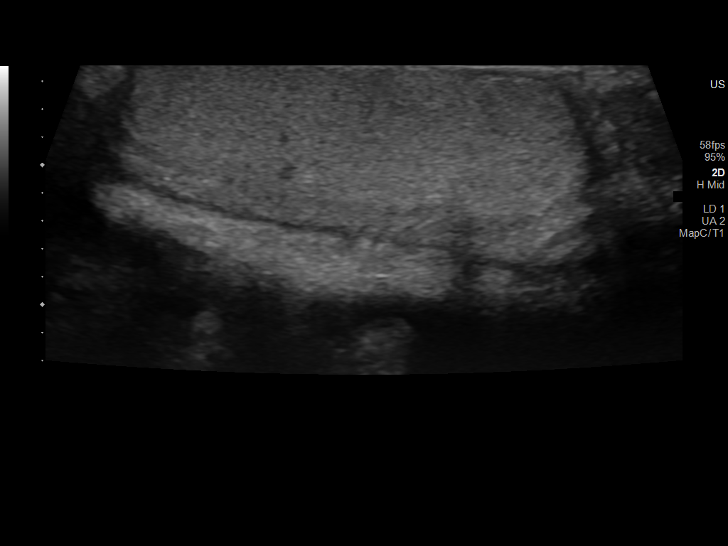
[im 16/30]
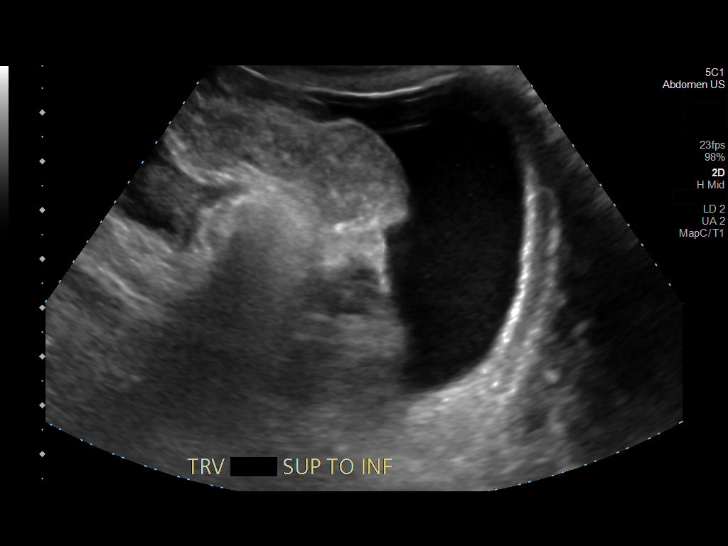
[im 19/30]
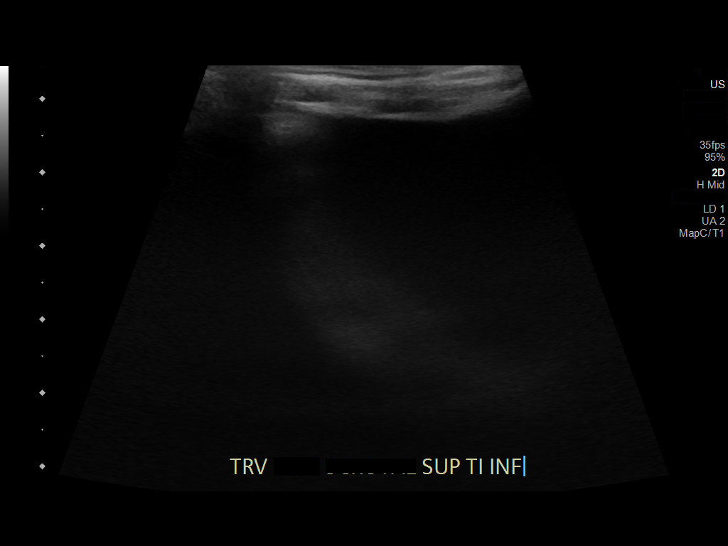
[im 20/30]
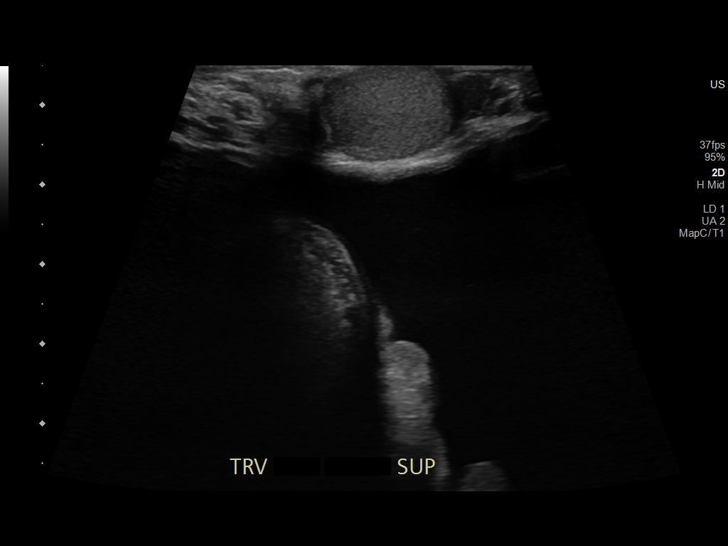
[im 22/30]
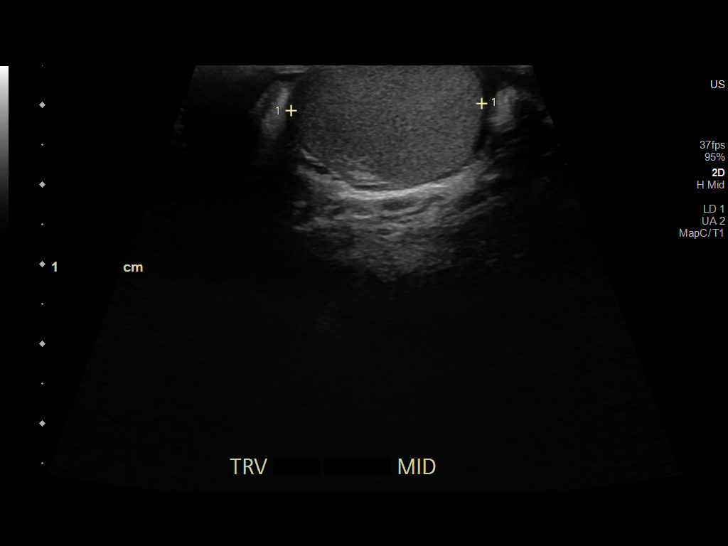
[im 25/30]
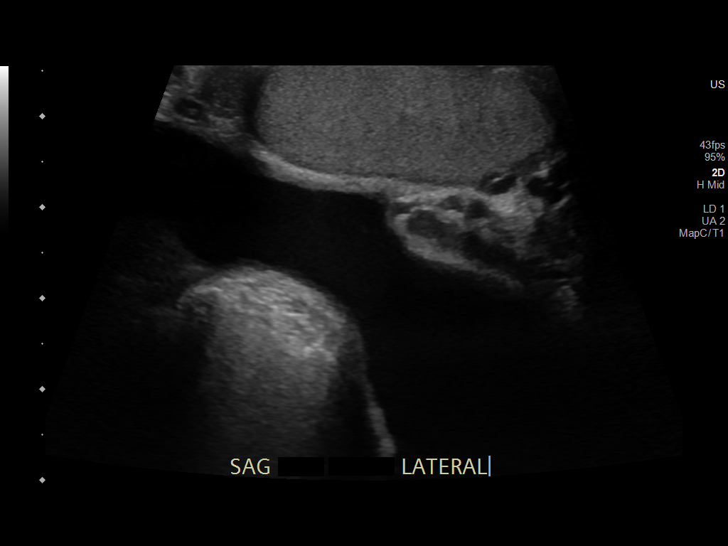
[im 27/30]
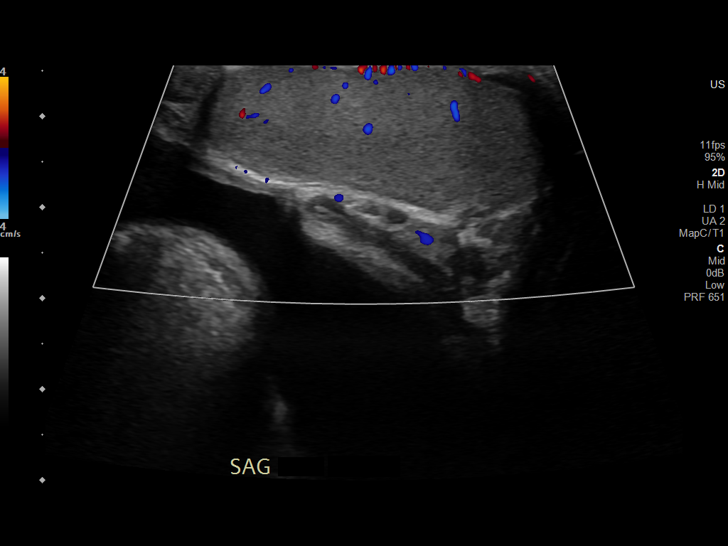
[im 30/30]
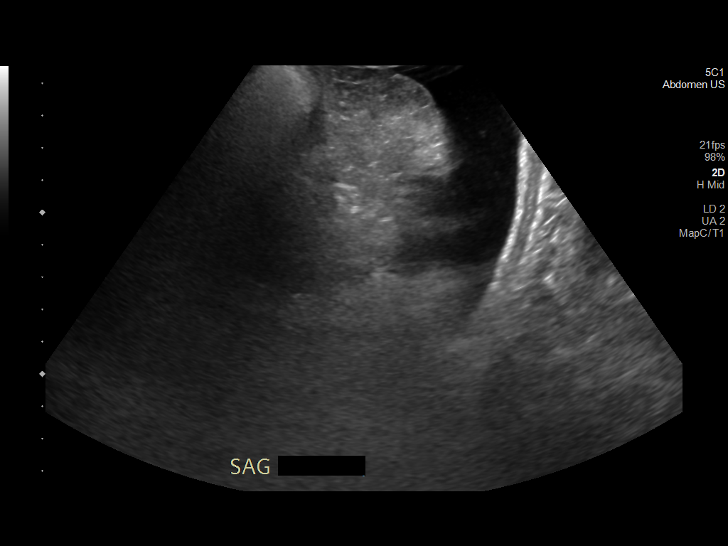

[14 of 25 positions shown; findings below may reference images not displayed]

FINDINGS: Right testicle

Measurements: 3.5 x 1.7 x 2.1 cm. No mass or microlithiasis
visualized. Internal blood flow is seen on color Doppler ultrasound.

Left testicle

Measurements: 3.8 x 1.6 x 2.4 cm. No mass or microlithiasis
visualized. Internal blood flow is seen on color Doppler ultrasound.

Right epididymis:  Normal in size and appearance.

Left epididymis:  Normal in size and appearance.

Hydrocele: A large left-sided hydrocele and left inguinal hernia are
seen.

Varicocele:  None visualized.
IMPRESSION: Large left hydrocele and left inguinal hernia.

No evidence of testicular mass.

## 2021-12-13 IMAGING — CT CT ABD-PELV W/O CM
2 of 7 series · 15 of 46 positions shown, 17 images · non-contrast
Comparison: CT 04/27/2012

CLINICAL DATA: 53-year-old with abdominal distension. Evaluate
anatomy prior to percutaneous gastrostomy tube placement.

EXAM:
CT ABDOMEN AND PELVIS WITHOUT CONTRAST
TECHNIQUE: Multidetector CT imaging of the abdomen and pelvis was performed
following the standard protocol without IV contrast.

[Series 3: a/p w/o 5mm · axial · non-contrast · 0.87mm/px · z∈[+739,+1204]mm · 12 of 105 slices shown, 14 images]
[im 6/105  soft-tissue]
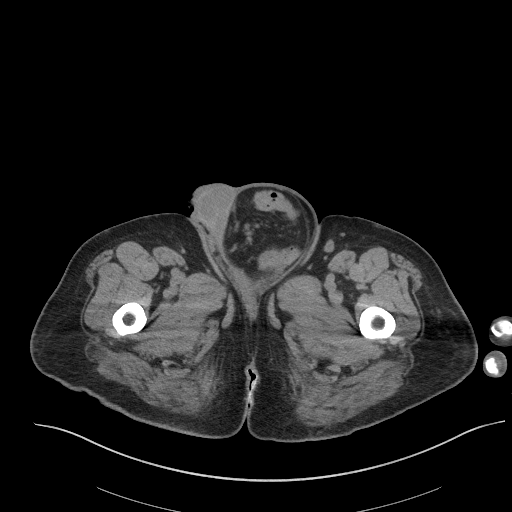
[im 6/105  bone]
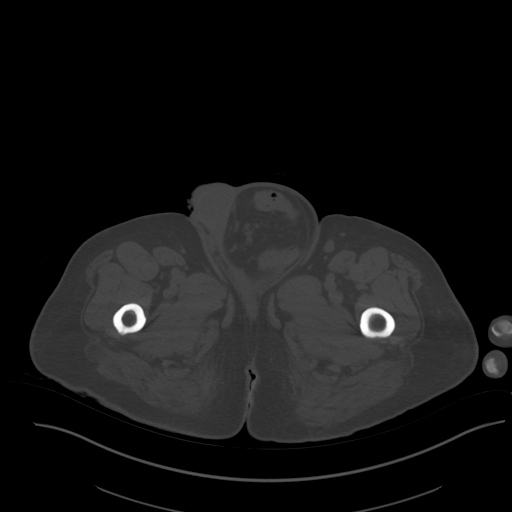
[im 17/105  soft-tissue]
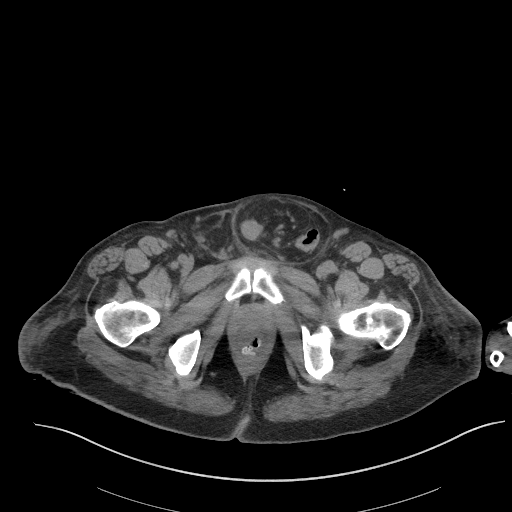
[im 22/105  soft-tissue]
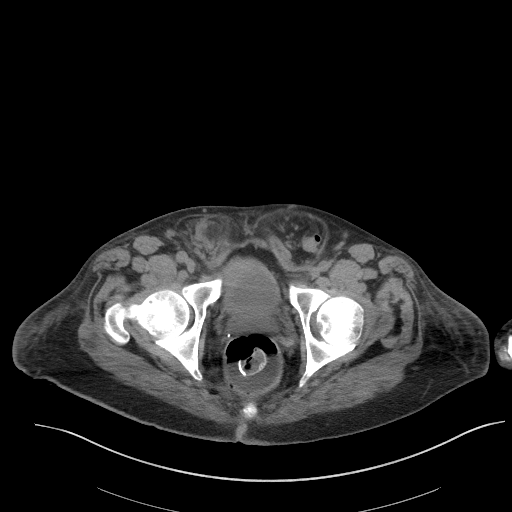
[im 33/105  soft-tissue]
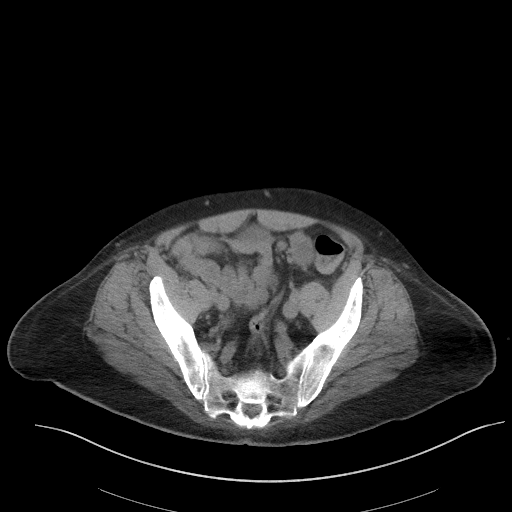
[im 39/105  soft-tissue]
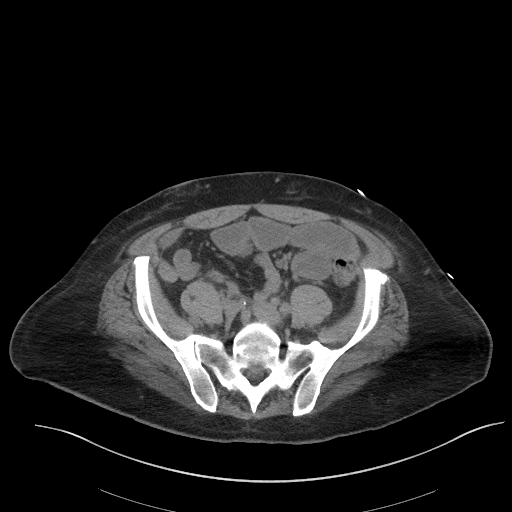
[im 50/105  soft-tissue]
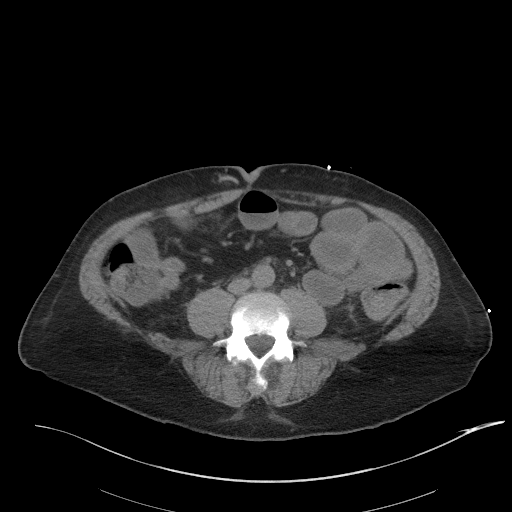
[im 55/105  soft-tissue]
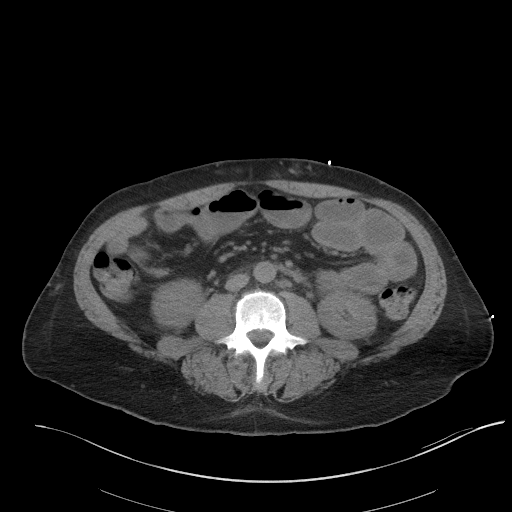
[im 66/105  soft-tissue]
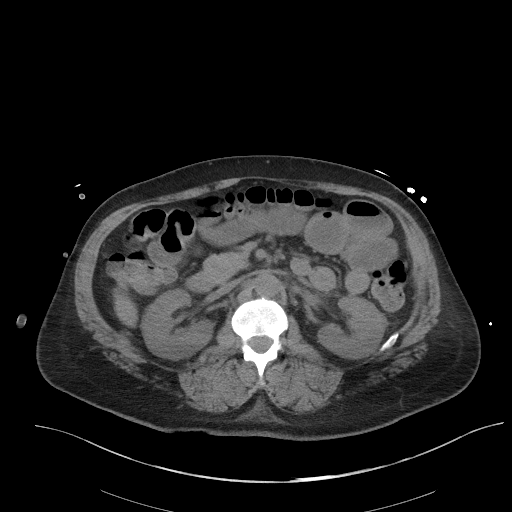
[im 72/105  soft-tissue]
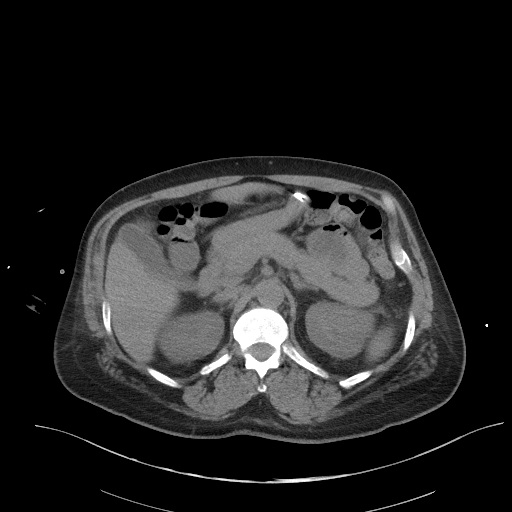
[im 72/105  bone]
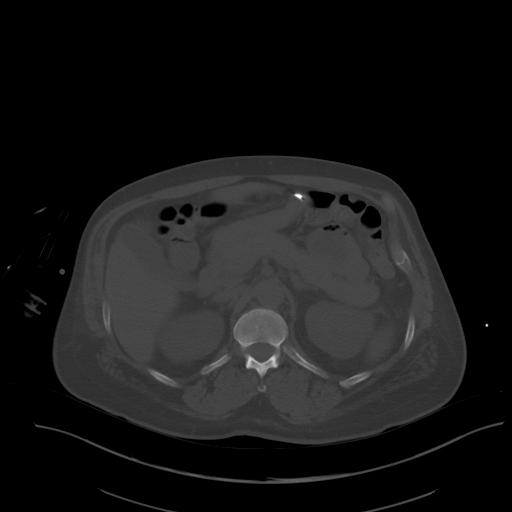
[im 83/105  soft-tissue]
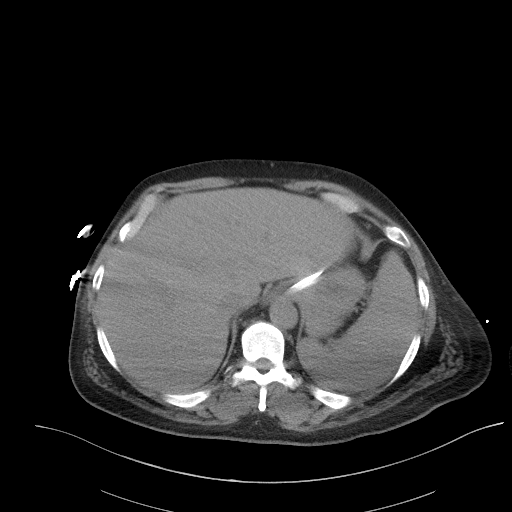
[im 88/105  soft-tissue]
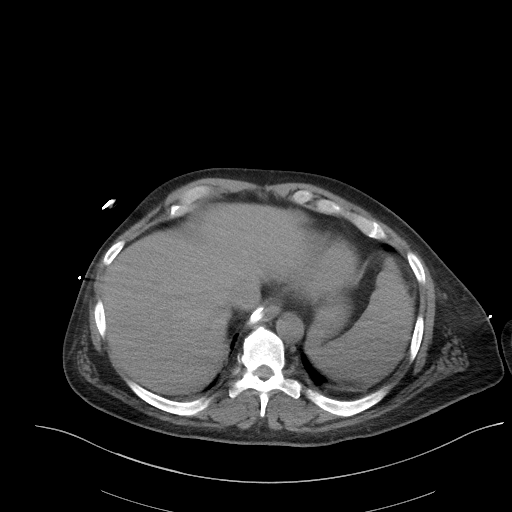
[im 99/105  soft-tissue]
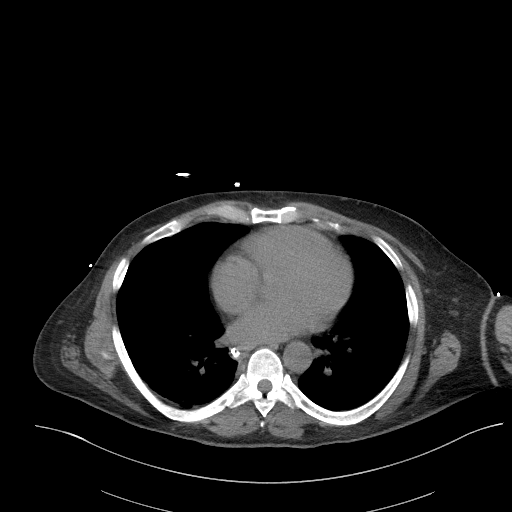

[Series 6: a/p w/o cor · coronal · non-contrast · 0.89mm/px · 3 of 150 slices shown]
[im 38/150  soft-tissue]
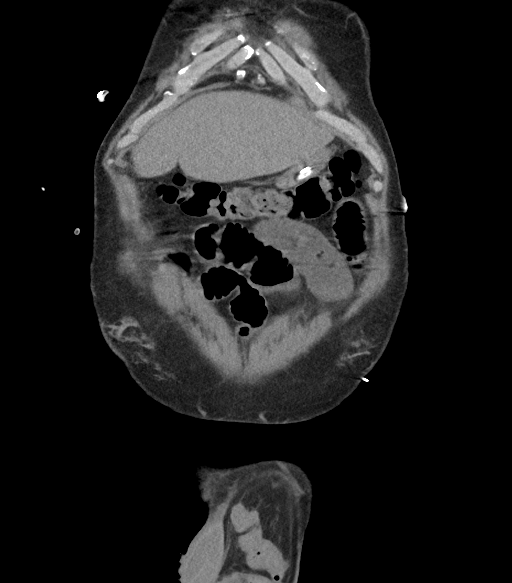
[im 75/150  soft-tissue]
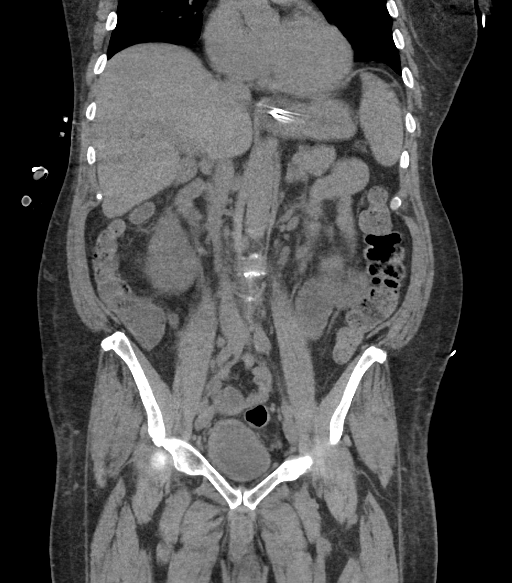
[im 112/150  soft-tissue]
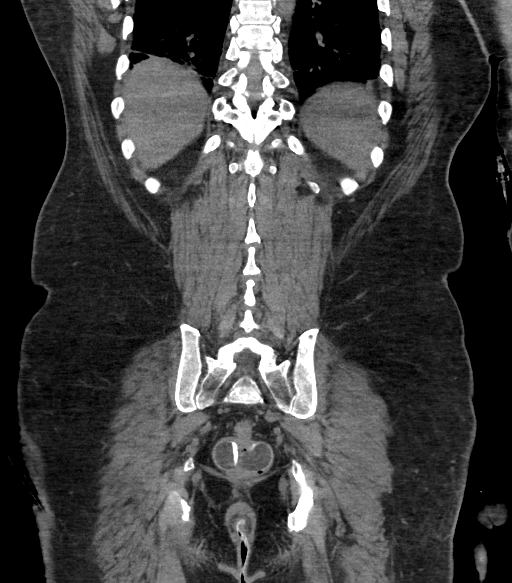

[15 of 46 positions shown; findings below may reference images not displayed]

FINDINGS: Lower chest: Patchy atelectasis at both lung bases, right side
greater than left. No significant pleural fluid.

Hepatobiliary: Normal appearance of the liver and gallbladder.

Pancreas: Unremarkable. No pancreatic ductal dilatation or
surrounding inflammatory changes.

Spleen: Normal in size without focal abnormality.

Adrenals/Urinary Tract: Normal appearance of the adrenal glands.
Negative for kidney stones or hydronephrosis. Wall thickening
associated with the anterior urinary bladder and a portion of the
anterior urinary bladder may be extending into the right inguinal
hernia. No significant bladder distension.

Stomach/Bowel: Nasogastric tube terminates in the stomach body.
Stomach is not distended. Normal appearance of the duodenum.
Fluid-filled dilated loops of small bowel in the abdomen. Small
bowel loops measure up to 3.6 cm in the left anterior abdomen on
sequence 3, image 52. Distal small bowel is decompressed. Patient
has a right inguinal hernia containing fat and difficult to exclude
bowel in this area. There is edema and inflammatory changes
associated with the right inguinal hernia and wall thickening of the
anterior urinary bladder in this area. Findings raise concern for an
incarcerated right inguinal hernia and at least partial small bowel
obstruction. Left inguinal hernia contains a portion of the sigmoid
colon along with fluid. No significant distension of the colon
proximal to the left inguinal hernia. There is a rectal tube with an
inflated balloon. Normal gas-filled appendix. Stomach is cephalad to
the transverse colon.

Vascular/Lymphatic: Normal caliber of the abdominal aorta. Small
lymph nodes in the retroperitoneum. Small inguinal lymph nodes.

Reproductive: Prostate is unremarkable.

Other: Bilateral inguinal hernias, left side greater than right.
Large amount of colon within the left inguinal hernia containing
fluid as well. Concern for inflammatory changes associated with the
right inguinal hernia. Negative for free air. Small amount of
presacral edema. No significant free fluid within the abdomen or
pelvis (except for the left inguinal hernia).

Musculoskeletal: No acute bone abnormality.
IMPRESSION: 1. Right inguinal hernia with inflammatory changes. There is wall
thickening of the anterior urinary bladder likely associated with
the right inguinal hernia and mild distention of small bowel loops
containing fluid. Findings are concerning for an incarcerated right
inguinal hernia with at least a partial small bowel obstruction.
Recommend surgical consultation.
2. Large left inguinal hernia containing sigmoid colon and fluid.
There is no evidence for a colonic obstruction.
3. Transverse colon is caudal to the stomach and the anatomy is
amendable for percutaneous gastrostomy tube placement.

These results were called by telephone at the time of interpretation
on 11/14/2019 at [DATE] to provider ENKALI SILAS POSTRICK , who verbally
acknowledged these results.
